# Patient Record
Sex: Male | Born: 1935 | Race: White | Hispanic: No | Smoking: Never smoker
Health system: Southern US, Community
[De-identification: ages and names within clinical notes are randomized; demographics above are authoritative.]

## PROBLEM LIST (undated history)

## (undated) DIAGNOSIS — Z952 Presence of prosthetic heart valve: Secondary | ICD-10-CM

## (undated) DIAGNOSIS — K219 Gastro-esophageal reflux disease without esophagitis: Secondary | ICD-10-CM

## (undated) DIAGNOSIS — Z85828 Personal history of other malignant neoplasm of skin: Secondary | ICD-10-CM

## (undated) DIAGNOSIS — N1831 Chronic kidney disease, stage 3a: Secondary | ICD-10-CM

## (undated) DIAGNOSIS — I350 Nonrheumatic aortic (valve) stenosis: Secondary | ICD-10-CM

## (undated) DIAGNOSIS — I4891 Unspecified atrial fibrillation: Secondary | ICD-10-CM

## (undated) DIAGNOSIS — I739 Peripheral vascular disease, unspecified: Secondary | ICD-10-CM

## (undated) DIAGNOSIS — I5022 Chronic systolic (congestive) heart failure: Secondary | ICD-10-CM

## (undated) DIAGNOSIS — M1990 Unspecified osteoarthritis, unspecified site: Secondary | ICD-10-CM

## (undated) DIAGNOSIS — I714 Abdominal aortic aneurysm, without rupture, unspecified: Secondary | ICD-10-CM

## (undated) DIAGNOSIS — Z86711 Personal history of pulmonary embolism: Secondary | ICD-10-CM

## (undated) DIAGNOSIS — I4510 Unspecified right bundle-branch block: Secondary | ICD-10-CM

## (undated) DIAGNOSIS — Z7901 Long term (current) use of anticoagulants: Secondary | ICD-10-CM

## (undated) DIAGNOSIS — I10 Essential (primary) hypertension: Secondary | ICD-10-CM

## (undated) DIAGNOSIS — K6389 Other specified diseases of intestine: Secondary | ICD-10-CM

## (undated) DIAGNOSIS — R3129 Other microscopic hematuria: Secondary | ICD-10-CM

## (undated) DIAGNOSIS — I4821 Permanent atrial fibrillation: Secondary | ICD-10-CM

## (undated) DIAGNOSIS — H3530 Unspecified macular degeneration: Secondary | ICD-10-CM

## (undated) DIAGNOSIS — C801 Malignant (primary) neoplasm, unspecified: Secondary | ICD-10-CM

## (undated) DIAGNOSIS — I272 Pulmonary hypertension, unspecified: Secondary | ICD-10-CM

## (undated) DIAGNOSIS — I2510 Atherosclerotic heart disease of native coronary artery without angina pectoris: Secondary | ICD-10-CM

## (undated) DIAGNOSIS — I700 Atherosclerosis of aorta: Secondary | ICD-10-CM

## (undated) DIAGNOSIS — E7800 Pure hypercholesterolemia, unspecified: Secondary | ICD-10-CM

## (undated) DIAGNOSIS — I779 Disorder of arteries and arterioles, unspecified: Secondary | ICD-10-CM

## (undated) DIAGNOSIS — I495 Sick sinus syndrome: Secondary | ICD-10-CM

## (undated) DIAGNOSIS — K7460 Unspecified cirrhosis of liver: Secondary | ICD-10-CM

## (undated) HISTORY — DX: Other specified diseases of intestine: K63.89

## (undated) HISTORY — PX: ABDOMINAL AORTIC ANEURYSM REPAIR: SUR1152

## (undated) HISTORY — PX: CHOLECYSTECTOMY: SHX55

## (undated) HISTORY — PX: JOINT REPLACEMENT: SHX530

## (undated) HISTORY — PX: CORONARY ANGIOPLASTY: SHX604

## (undated) HISTORY — PX: HEMORRHOID SURGERY: SHX153

## (undated) HISTORY — PX: OTHER SURGICAL HISTORY: SHX169

## (undated) HISTORY — PX: ORTHOPEDIC SURGERY: SHX850

## (undated) HISTORY — DX: Pure hypercholesterolemia, unspecified: E78.00

## (undated) HISTORY — DX: Atherosclerotic heart disease of native coronary artery without angina pectoris: I25.10

## (undated) HISTORY — DX: Peripheral vascular disease, unspecified: I73.9

## (undated) HISTORY — PX: TONSILLECTOMY: SUR1361

## (undated) HISTORY — PX: EYE SURGERY: SHX253

## (undated) HISTORY — DX: Sick sinus syndrome: I49.5

## (undated) HISTORY — PX: TOTAL KNEE ARTHROPLASTY: SHX125

## (undated) HISTORY — PX: HERNIA REPAIR: SHX51

## (undated) SURGERY — Surgical Case
Anesthesia: *Unknown

---

## 1998-07-02 ENCOUNTER — Other Ambulatory Visit: Admission: RE | Admit: 1998-07-02 | Discharge: 1998-07-02 | Payer: Self-pay | Admitting: Urology

## 2000-08-02 ENCOUNTER — Other Ambulatory Visit: Admission: RE | Admit: 2000-08-02 | Discharge: 2000-08-02 | Payer: Self-pay | Admitting: Urology

## 2001-08-02 ENCOUNTER — Other Ambulatory Visit: Admission: RE | Admit: 2001-08-02 | Discharge: 2001-08-02 | Payer: Self-pay | Admitting: Urology

## 2003-12-30 ENCOUNTER — Ambulatory Visit: Payer: Self-pay | Admitting: Cardiology

## 2004-01-01 ENCOUNTER — Encounter: Admission: RE | Admit: 2004-01-01 | Discharge: 2004-01-01 | Payer: Self-pay | Admitting: Chiropractic Medicine

## 2004-01-12 ENCOUNTER — Ambulatory Visit: Payer: Self-pay | Admitting: Cardiology

## 2004-01-26 ENCOUNTER — Ambulatory Visit: Payer: Self-pay | Admitting: Cardiology

## 2004-01-29 ENCOUNTER — Encounter: Admission: RE | Admit: 2004-01-29 | Discharge: 2004-01-29 | Payer: Self-pay | Admitting: Family Medicine

## 2004-02-10 ENCOUNTER — Ambulatory Visit: Payer: Self-pay | Admitting: Internal Medicine

## 2004-03-09 ENCOUNTER — Ambulatory Visit: Payer: Self-pay | Admitting: Cardiology

## 2004-03-10 ENCOUNTER — Ambulatory Visit: Payer: Self-pay

## 2004-03-10 ENCOUNTER — Encounter: Admission: RE | Admit: 2004-03-10 | Discharge: 2004-03-10 | Payer: Self-pay | Admitting: Family Medicine

## 2004-03-11 ENCOUNTER — Ambulatory Visit: Payer: Self-pay | Admitting: Cardiology

## 2004-03-17 ENCOUNTER — Ambulatory Visit: Payer: Self-pay | Admitting: Oncology

## 2004-03-21 ENCOUNTER — Ambulatory Visit: Payer: Self-pay | Admitting: *Deleted

## 2004-04-06 ENCOUNTER — Ambulatory Visit: Payer: Self-pay | Admitting: Cardiology

## 2004-05-04 ENCOUNTER — Ambulatory Visit: Payer: Self-pay | Admitting: Cardiovascular Disease

## 2004-06-02 ENCOUNTER — Ambulatory Visit: Payer: Self-pay | Admitting: Internal Medicine

## 2004-06-23 ENCOUNTER — Ambulatory Visit: Payer: Self-pay | Admitting: Cardiology

## 2004-07-18 ENCOUNTER — Ambulatory Visit: Payer: Self-pay | Admitting: Cardiology

## 2004-07-19 ENCOUNTER — Ambulatory Visit: Payer: Self-pay | Admitting: Cardiology

## 2004-08-01 ENCOUNTER — Ambulatory Visit: Payer: Self-pay | Admitting: Oncology

## 2004-08-01 ENCOUNTER — Ambulatory Visit: Payer: Self-pay | Admitting: Cardiology

## 2004-08-15 ENCOUNTER — Ambulatory Visit: Payer: Self-pay | Admitting: Cardiology

## 2004-09-12 ENCOUNTER — Ambulatory Visit: Payer: Self-pay | Admitting: Cardiology

## 2004-10-03 ENCOUNTER — Ambulatory Visit: Payer: Self-pay | Admitting: Cardiology

## 2004-10-24 ENCOUNTER — Ambulatory Visit: Payer: Self-pay | Admitting: Internal Medicine

## 2004-11-21 ENCOUNTER — Ambulatory Visit: Payer: Self-pay | Admitting: Internal Medicine

## 2004-12-19 ENCOUNTER — Ambulatory Visit: Payer: Self-pay | Admitting: Internal Medicine

## 2005-01-19 ENCOUNTER — Ambulatory Visit: Payer: Self-pay | Admitting: Cardiology

## 2005-01-27 ENCOUNTER — Ambulatory Visit: Payer: Self-pay | Admitting: Cardiology

## 2005-02-10 ENCOUNTER — Ambulatory Visit: Payer: Self-pay | Admitting: Cardiology

## 2005-02-10 ENCOUNTER — Ambulatory Visit: Payer: Self-pay

## 2005-02-16 ENCOUNTER — Ambulatory Visit: Payer: Self-pay | Admitting: Cardiology

## 2005-02-27 ENCOUNTER — Ambulatory Visit: Payer: Self-pay

## 2005-03-13 ENCOUNTER — Ambulatory Visit: Payer: Self-pay | Admitting: Gastroenterology

## 2005-03-16 ENCOUNTER — Ambulatory Visit: Payer: Self-pay | Admitting: Cardiology

## 2005-03-24 ENCOUNTER — Encounter (INDEPENDENT_AMBULATORY_CARE_PROVIDER_SITE_OTHER): Payer: Self-pay | Admitting: Specialist

## 2005-03-24 ENCOUNTER — Ambulatory Visit: Payer: Self-pay | Admitting: Gastroenterology

## 2005-04-13 ENCOUNTER — Ambulatory Visit: Payer: Self-pay | Admitting: Internal Medicine

## 2005-04-24 ENCOUNTER — Ambulatory Visit: Payer: Self-pay | Admitting: Gastroenterology

## 2005-05-11 ENCOUNTER — Ambulatory Visit: Payer: Self-pay | Admitting: Internal Medicine

## 2005-05-22 ENCOUNTER — Ambulatory Visit: Payer: Self-pay | Admitting: Cardiology

## 2005-05-29 ENCOUNTER — Ambulatory Visit: Payer: Self-pay | Admitting: Cardiology

## 2005-06-08 ENCOUNTER — Ambulatory Visit: Payer: Self-pay | Admitting: *Deleted

## 2005-07-06 ENCOUNTER — Ambulatory Visit: Payer: Self-pay | Admitting: Internal Medicine

## 2005-08-03 ENCOUNTER — Ambulatory Visit: Payer: Self-pay | Admitting: Cardiology

## 2005-08-31 ENCOUNTER — Ambulatory Visit: Payer: Self-pay | Admitting: Internal Medicine

## 2005-09-26 ENCOUNTER — Ambulatory Visit: Payer: Self-pay | Admitting: Cardiology

## 2005-10-24 ENCOUNTER — Ambulatory Visit: Payer: Self-pay | Admitting: Cardiology

## 2005-11-16 ENCOUNTER — Ambulatory Visit: Payer: Self-pay | Admitting: Cardiology

## 2005-12-14 ENCOUNTER — Ambulatory Visit: Payer: Self-pay | Admitting: Cardiology

## 2006-01-11 ENCOUNTER — Ambulatory Visit: Payer: Self-pay | Admitting: Cardiology

## 2006-03-05 ENCOUNTER — Ambulatory Visit: Payer: Self-pay | Admitting: Cardiology

## 2006-04-02 ENCOUNTER — Ambulatory Visit: Payer: Self-pay | Admitting: Cardiology

## 2006-04-16 ENCOUNTER — Emergency Department (HOSPITAL_COMMUNITY): Admission: EM | Admit: 2006-04-16 | Discharge: 2006-04-16 | Payer: Self-pay | Admitting: Emergency Medicine

## 2006-04-17 ENCOUNTER — Encounter: Admission: RE | Admit: 2006-04-17 | Discharge: 2006-04-17 | Payer: Self-pay | Admitting: Family Medicine

## 2006-04-30 ENCOUNTER — Ambulatory Visit: Payer: Self-pay | Admitting: Cardiology

## 2006-05-17 ENCOUNTER — Ambulatory Visit: Payer: Self-pay | Admitting: Cardiology

## 2006-05-28 ENCOUNTER — Ambulatory Visit: Payer: Self-pay | Admitting: Cardiology

## 2006-06-25 ENCOUNTER — Ambulatory Visit: Payer: Self-pay | Admitting: Cardiology

## 2006-07-23 ENCOUNTER — Ambulatory Visit: Payer: Self-pay | Admitting: Cardiovascular Disease

## 2006-08-15 ENCOUNTER — Ambulatory Visit: Payer: Self-pay | Admitting: Cardiology

## 2006-09-05 ENCOUNTER — Ambulatory Visit: Payer: Self-pay | Admitting: Internal Medicine

## 2006-09-27 ENCOUNTER — Ambulatory Visit: Payer: Self-pay | Admitting: Internal Medicine

## 2006-10-18 ENCOUNTER — Ambulatory Visit: Payer: Self-pay | Admitting: Cardiology

## 2006-11-08 ENCOUNTER — Ambulatory Visit: Payer: Self-pay | Admitting: Cardiology

## 2006-11-19 ENCOUNTER — Ambulatory Visit: Payer: Self-pay | Admitting: Cardiology

## 2006-11-26 ENCOUNTER — Ambulatory Visit: Payer: Self-pay | Admitting: Cardiology

## 2006-11-26 LAB — CONVERTED CEMR LAB
ALT: 27 units/L (ref 0–53)
AST: 27 units/L (ref 0–37)
Albumin: 3.5 g/dL (ref 3.5–5.2)
Alkaline Phosphatase: 51 units/L (ref 39–117)
BUN: 17 mg/dL (ref 6–23)
Bilirubin, Direct: 0.2 mg/dL (ref 0.0–0.3)
CO2: 29 meq/L (ref 19–32)
Calcium: 8.6 mg/dL (ref 8.4–10.5)
Chloride: 107 meq/L (ref 96–112)
Cholesterol: 125 mg/dL (ref 0–200)
Creatinine, Ser: 1 mg/dL (ref 0.4–1.5)
GFR calc Af Amer: 93 mL/min
GFR calc non Af Amer: 77 mL/min
Glucose, Bld: 102 mg/dL — ABNORMAL HIGH (ref 70–99)
HDL: 41 mg/dL (ref >39.0)
LDL Cholesterol: 63 mg/dL (ref 0–99)
Potassium: 4.7 meq/L (ref 3.5–5.1)
Sodium: 140 meq/L (ref 135–145)
Total Bilirubin: 1.2 mg/dL (ref 0.3–1.2)
Total CHOL/HDL Ratio: 3
Total Protein: 6.6 g/dL (ref 6.0–8.3)
Triglycerides: 103 mg/dL (ref 0–149)
VLDL: 21 mg/dL (ref 0–40)

## 2006-12-10 ENCOUNTER — Ambulatory Visit: Payer: Self-pay | Admitting: Cardiology

## 2007-01-01 ENCOUNTER — Ambulatory Visit: Payer: Self-pay | Admitting: Cardiology

## 2007-01-22 ENCOUNTER — Ambulatory Visit: Payer: Self-pay | Admitting: Cardiology

## 2007-02-05 ENCOUNTER — Ambulatory Visit: Payer: Self-pay | Admitting: Cardiology

## 2007-02-19 ENCOUNTER — Ambulatory Visit: Payer: Self-pay

## 2007-02-26 ENCOUNTER — Ambulatory Visit: Payer: Self-pay | Admitting: Cardiology

## 2007-03-26 ENCOUNTER — Ambulatory Visit: Payer: Self-pay | Admitting: Internal Medicine

## 2007-04-17 ENCOUNTER — Ambulatory Visit: Payer: Self-pay | Admitting: Cardiology

## 2007-06-05 ENCOUNTER — Ambulatory Visit: Payer: Self-pay | Admitting: Cardiology

## 2007-07-03 ENCOUNTER — Ambulatory Visit: Payer: Self-pay | Admitting: Cardiology

## 2007-07-30 ENCOUNTER — Ambulatory Visit: Payer: Self-pay | Admitting: Cardiology

## 2007-08-27 ENCOUNTER — Ambulatory Visit: Payer: Self-pay | Admitting: Cardiology

## 2007-09-19 ENCOUNTER — Ambulatory Visit: Payer: Self-pay | Admitting: Internal Medicine

## 2007-10-17 ENCOUNTER — Ambulatory Visit: Payer: Self-pay | Admitting: Internal Medicine

## 2007-10-17 ENCOUNTER — Ambulatory Visit: Payer: Self-pay | Admitting: Cardiology

## 2007-11-01 ENCOUNTER — Encounter: Admission: RE | Admit: 2007-11-01 | Discharge: 2007-11-01 | Payer: Self-pay | Admitting: Family Medicine

## 2007-11-14 ENCOUNTER — Ambulatory Visit: Payer: Self-pay | Admitting: Internal Medicine

## 2007-12-05 ENCOUNTER — Ambulatory Visit: Payer: Self-pay | Admitting: Cardiology

## 2007-12-12 ENCOUNTER — Ambulatory Visit: Payer: Self-pay | Admitting: Internal Medicine

## 2007-12-31 ENCOUNTER — Ambulatory Visit: Payer: Self-pay | Admitting: Cardiology

## 2008-01-28 ENCOUNTER — Ambulatory Visit: Payer: Self-pay | Admitting: Cardiology

## 2008-02-25 ENCOUNTER — Ambulatory Visit: Payer: Self-pay | Admitting: Cardiovascular Disease

## 2008-03-24 ENCOUNTER — Ambulatory Visit: Payer: Self-pay | Admitting: Cardiology

## 2008-04-21 ENCOUNTER — Ambulatory Visit: Payer: Self-pay | Admitting: Cardiology

## 2008-04-23 ENCOUNTER — Ambulatory Visit: Payer: Self-pay | Admitting: Cardiology

## 2008-05-19 ENCOUNTER — Ambulatory Visit: Payer: Self-pay | Admitting: Cardiovascular Disease

## 2008-05-26 ENCOUNTER — Ambulatory Visit: Payer: Self-pay | Admitting: Cardiology

## 2008-06-16 ENCOUNTER — Ambulatory Visit: Payer: Self-pay | Admitting: Cardiology

## 2008-07-10 ENCOUNTER — Ambulatory Visit: Payer: Self-pay | Admitting: Cardiology

## 2008-07-10 LAB — CONVERTED CEMR LAB
POC INR: 3.6
Protime: 22.8

## 2008-07-14 ENCOUNTER — Encounter: Payer: Self-pay | Admitting: *Deleted

## 2008-08-07 ENCOUNTER — Encounter (INDEPENDENT_AMBULATORY_CARE_PROVIDER_SITE_OTHER): Payer: Self-pay | Admitting: Pharmacist

## 2008-08-07 ENCOUNTER — Ambulatory Visit: Payer: Self-pay | Admitting: Internal Medicine

## 2008-08-07 LAB — CONVERTED CEMR LAB
POC INR: 3.3
Prothrombin Time: 21.8 s

## 2008-08-11 ENCOUNTER — Telehealth: Payer: Self-pay | Admitting: Cardiology

## 2008-08-19 ENCOUNTER — Encounter: Payer: Self-pay | Admitting: *Deleted

## 2008-08-28 ENCOUNTER — Ambulatory Visit: Payer: Self-pay | Admitting: Cardiology

## 2008-08-28 LAB — CONVERTED CEMR LAB
POC INR: 2.4
Prothrombin Time: 18.8 s

## 2008-09-25 ENCOUNTER — Ambulatory Visit: Payer: Self-pay | Admitting: Cardiology

## 2008-09-25 LAB — CONVERTED CEMR LAB: POC INR: 2.6

## 2008-10-20 ENCOUNTER — Telehealth (INDEPENDENT_AMBULATORY_CARE_PROVIDER_SITE_OTHER): Payer: Self-pay | Admitting: *Deleted

## 2008-10-21 ENCOUNTER — Telehealth: Payer: Self-pay | Admitting: Cardiology

## 2008-10-21 ENCOUNTER — Ambulatory Visit: Payer: Self-pay | Admitting: Cardiology

## 2008-10-21 DIAGNOSIS — I2510 Atherosclerotic heart disease of native coronary artery without angina pectoris: Secondary | ICD-10-CM | POA: Insufficient documentation

## 2008-10-21 DIAGNOSIS — I25119 Atherosclerotic heart disease of native coronary artery with unspecified angina pectoris: Secondary | ICD-10-CM | POA: Insufficient documentation

## 2008-10-21 DIAGNOSIS — I4821 Permanent atrial fibrillation: Secondary | ICD-10-CM | POA: Insufficient documentation

## 2008-10-21 DIAGNOSIS — E7800 Pure hypercholesterolemia, unspecified: Secondary | ICD-10-CM | POA: Insufficient documentation

## 2008-10-22 ENCOUNTER — Ambulatory Visit: Payer: Self-pay

## 2008-10-22 ENCOUNTER — Encounter: Payer: Self-pay | Admitting: Cardiovascular Disease

## 2008-10-27 ENCOUNTER — Inpatient Hospital Stay (HOSPITAL_COMMUNITY): Admission: RE | Admit: 2008-10-27 | Discharge: 2008-10-29 | Payer: Self-pay | Admitting: Orthopedic Surgery

## 2008-10-30 ENCOUNTER — Encounter: Payer: Self-pay | Admitting: Internal Medicine

## 2008-10-30 LAB — CONVERTED CEMR LAB
POC INR: 1.8
Prothrombin Time: 18.4 s

## 2008-11-04 ENCOUNTER — Encounter: Payer: Self-pay | Admitting: Internal Medicine

## 2008-11-04 ENCOUNTER — Encounter: Payer: Self-pay | Admitting: Cardiology

## 2008-11-04 LAB — CONVERTED CEMR LAB
POC INR: 4.1
Prothrombin Time: 41.1 s

## 2008-11-11 ENCOUNTER — Encounter: Payer: Self-pay | Admitting: Cardiovascular Disease

## 2008-11-11 ENCOUNTER — Encounter: Payer: Self-pay | Admitting: Cardiology

## 2008-11-11 LAB — CONVERTED CEMR LAB: POC INR: 1.5

## 2008-11-17 ENCOUNTER — Ambulatory Visit: Payer: Self-pay | Admitting: Cardiology

## 2008-11-17 LAB — CONVERTED CEMR LAB: POC INR: 2.8

## 2008-12-01 ENCOUNTER — Ambulatory Visit: Payer: Self-pay | Admitting: Cardiology

## 2008-12-01 LAB — CONVERTED CEMR LAB: POC INR: 2.5

## 2008-12-29 ENCOUNTER — Ambulatory Visit: Payer: Self-pay | Admitting: Cardiology

## 2008-12-29 LAB — CONVERTED CEMR LAB: POC INR: 2.1

## 2009-01-27 ENCOUNTER — Ambulatory Visit: Payer: Self-pay | Admitting: Cardiology

## 2009-01-27 DIAGNOSIS — I10 Essential (primary) hypertension: Secondary | ICD-10-CM | POA: Insufficient documentation

## 2009-01-27 LAB — CONVERTED CEMR LAB: POC INR: 2.3

## 2009-02-23 ENCOUNTER — Encounter (INDEPENDENT_AMBULATORY_CARE_PROVIDER_SITE_OTHER): Payer: Self-pay | Admitting: Cardiology

## 2009-02-23 ENCOUNTER — Ambulatory Visit: Payer: Self-pay | Admitting: Internal Medicine

## 2009-02-23 LAB — CONVERTED CEMR LAB: POC INR: 2.8

## 2009-03-23 ENCOUNTER — Ambulatory Visit: Payer: Self-pay | Admitting: Cardiology

## 2009-03-23 LAB — CONVERTED CEMR LAB: POC INR: 2.1

## 2009-04-20 ENCOUNTER — Ambulatory Visit: Payer: Self-pay | Admitting: Cardiovascular Disease

## 2009-04-20 LAB — CONVERTED CEMR LAB: POC INR: 1.9

## 2009-05-18 ENCOUNTER — Ambulatory Visit: Payer: Self-pay | Admitting: Internal Medicine

## 2009-05-18 LAB — CONVERTED CEMR LAB: POC INR: 2.5

## 2009-05-24 ENCOUNTER — Encounter: Payer: Self-pay | Admitting: Cardiology

## 2009-06-15 ENCOUNTER — Ambulatory Visit: Payer: Self-pay | Admitting: Cardiology

## 2009-06-15 LAB — CONVERTED CEMR LAB: POC INR: 2.6

## 2009-06-23 ENCOUNTER — Encounter: Payer: Self-pay | Admitting: Cardiology

## 2009-07-14 ENCOUNTER — Ambulatory Visit: Payer: Self-pay | Admitting: Cardiology

## 2009-07-14 DIAGNOSIS — R609 Edema, unspecified: Secondary | ICD-10-CM | POA: Insufficient documentation

## 2009-07-14 LAB — CONVERTED CEMR LAB: POC INR: 2.2

## 2009-07-19 ENCOUNTER — Telehealth: Payer: Self-pay | Admitting: Cardiology

## 2009-07-29 ENCOUNTER — Ambulatory Visit: Payer: Self-pay

## 2009-07-29 ENCOUNTER — Encounter: Payer: Self-pay | Admitting: Cardiology

## 2009-08-11 ENCOUNTER — Ambulatory Visit: Payer: Self-pay | Admitting: Cardiology

## 2009-08-11 LAB — CONVERTED CEMR LAB: POC INR: 1.9

## 2009-08-31 ENCOUNTER — Telehealth: Payer: Self-pay | Admitting: Gastroenterology

## 2009-09-09 ENCOUNTER — Ambulatory Visit: Payer: Self-pay | Admitting: Internal Medicine

## 2009-09-09 LAB — CONVERTED CEMR LAB: POC INR: 2.1

## 2009-10-07 ENCOUNTER — Ambulatory Visit: Payer: Self-pay | Admitting: Internal Medicine

## 2009-10-07 LAB — CONVERTED CEMR LAB: POC INR: 2

## 2009-11-04 ENCOUNTER — Ambulatory Visit: Payer: Self-pay | Admitting: Cardiology

## 2009-11-04 LAB — CONVERTED CEMR LAB: POC INR: 2.2

## 2009-12-01 ENCOUNTER — Ambulatory Visit: Payer: Self-pay | Admitting: Cardiology

## 2009-12-01 ENCOUNTER — Ambulatory Visit: Payer: Self-pay | Admitting: Internal Medicine

## 2009-12-01 LAB — CONVERTED CEMR LAB: POC INR: 2.4

## 2009-12-02 ENCOUNTER — Encounter: Payer: Self-pay | Admitting: Cardiology

## 2009-12-29 ENCOUNTER — Ambulatory Visit: Payer: Self-pay | Admitting: Cardiology

## 2009-12-29 LAB — CONVERTED CEMR LAB
INR: 2.1
POC INR: 2.1

## 2010-01-26 ENCOUNTER — Ambulatory Visit: Payer: Self-pay | Admitting: Cardiology

## 2010-01-26 LAB — CONVERTED CEMR LAB: POC INR: 1.5

## 2010-02-01 ENCOUNTER — Telehealth (INDEPENDENT_AMBULATORY_CARE_PROVIDER_SITE_OTHER): Payer: Self-pay | Admitting: *Deleted

## 2010-02-18 ENCOUNTER — Telehealth: Payer: Self-pay | Admitting: Cardiology

## 2010-02-28 ENCOUNTER — Ambulatory Visit: Admission: RE | Admit: 2010-02-28 | Discharge: 2010-02-28 | Payer: Self-pay | Source: Home / Self Care

## 2010-02-28 LAB — CONVERTED CEMR LAB: POC INR: 2.2

## 2010-03-15 NOTE — Medication Information (Signed)
Summary: [facility]  Anticoagulant Therapy  Managed by: Cloyde Reams, RN, BSN Referring MD: Shawnie Pons MD Supervising MD: Tenny Craw MD, Gunnar Fusi Indication 1: Atrial Fibrillation (ICD-427.31) Lab Used: [facility] Site: [facility] PT 18.4 INR POC 1.8 INR RANGE 2.0-3.0      Any changes in medication regimen? yes       Details: added hydrocodone/APAP and methocarbonol.       Allergies: No Known Drug Allergies  Anticoagulation Management History:      His anticoagulation is being managed by telephone today.  Positive risk factors for bleeding include an age of 75 years or older.  The bleeding index is 'intermediate risk'.  Positive CHADS2 values include Age > 75 years old.  The start date was 04/16/2000.  Prothrombin time is 18.4.  Anticoagulation responsible provider: Tenny Craw MD, Gunnar Fusi.  INR POC: 1.8.  Exp: 11/2009.    Anticoagulation Management Assessment/Plan:      The patient's current anticoagulation dose is Warfarin sodium 5 mg tabs: Use as directed by Anticoagulation Clinic.  The target INR is 0 - 0.  The next INR is due 11/04/2008.  Anticoagulation instructions were given to patient.  Results were reviewed/authorized by Cloyde Reams, RN, BSN.  He was notified by Cloyde Reams RN.         Prior Anticoagulation Instructions: INR 2.6  continue on the same dose: 1 tablet everyday  Recheck in 4 weeks  Current Anticoagulation Instructions: INR 1.8  Called spoke with pt.  Advised pt to take 7.5mg  today then resume 5mg  daily.  Recheck on 11/04/08.  Attempted to call Myrene Buddy at [facility] to give redraw PT/INR orders.  LMOM TCB. Cloyde Reams RN  October 30, 2008 11:43 AM   Spoke with Myrene Buddy, gave verbal order to redraw PT/INR on 11/04/08.  Cloyde Reams RN  October 30, 2008 2:01 PM

## 2010-03-15 NOTE — Medication Information (Signed)
Summary: rov/eac  Anticoagulant Therapy  Managed by: Bethena Midget, RN, BSN Referring MD: Shawnie Pons MD Supervising MD: Riley Kill MD, Maisie Fus Indication 1: Atrial Fibrillation (ICD-427.31) Lab Used: [facility] Site: [address] INR POC 2.1 INR RANGE 2.0-3.0  Dietary changes: no    Health status changes: no    Bleeding/hemorrhagic complications: no    Recent/future hospitalizations: no    Any changes in medication regimen? no    Recent/future dental: no  Any missed doses?: no       Is patient compliant with meds? yes       Allergies: No Known Drug Allergies  Anticoagulation Management History:      The patient is taking warfarin and comes in today for a routine follow up visit.  Positive risk factors for bleeding include an age of 75 years or older.  The bleeding index is 'intermediate risk'.  Positive CHADS2 values include History of HTN and Age > 75 years old.  The start date was 04/16/2000.  Anticoagulation responsible provider: Riley Kill MD, Maisie Fus.  INR POC: 2.1.  Cuvette Lot#: [id number].  Exp: 05/2010.    Anticoagulation Management Assessment/Plan:      The patient's current anticoagulation dose is Warfarin sodium 5 mg tabs: Use as directed by Anticoagulation Clinic.  The target INR is 2.0-3.0.  The next INR is due 04/20/2009.  Anticoagulation instructions were given to patient.  Results were reviewed/authorized by Bethena Midget, RN, BSN.  He was notified by Bethena Midget, RN, BSN.         Prior Anticoagulation Instructions: INR 2.8  Continue 1 tab daily.    Current Anticoagulation Instructions: INR 2.1 Continue 5mg s everyday. Recheck in 4 weeks.

## 2010-03-15 NOTE — Progress Notes (Signed)
Summary: Surg Clearance  Phone Note From Other Clinic   Caller: Nurse Reason for Call: Need Referral Information Summary of Call: Per Clydie Braun [phone] fax [phone]. Pt procedure is the 14th. He needs clearance for the procedure. Form was faxed and scanned into pt chart on the 3rd.  Initial call taken by: Edman Circle,  October 21, 2008 8:23 AM  Follow-up for Phone Call        I spoke with Clydie Braun and she will send the pt over to our office for an appointment to address surgical clearance. Follow-up by: Julieta Gutting, RN, BSN,  October 21, 2008 8:48 AM

## 2010-03-15 NOTE — Medication Information (Signed)
Summary: rov/tm  Anticoagulant Therapy  Managed by: Lyna Poser, PharmD Referring MD: Shawnie Pons MD PCP: Verdell Face MD: Graciela Husbands MD, Viviann Spare Indication 1: Atrial Fibrillation (ICD-427.31) Lab Used: [facility] Site: [address] INR POC 2.2 INR RANGE 2.0-3.0  Dietary changes: no    Health status changes: no    Bleeding/hemorrhagic complications: no    Recent/future hospitalizations: yes       Details: will be talking with orthopedic doctor on october 5th about scheduling a knee replacement. Will call us with date.  Any changes in medication regimen? no    Recent/future dental: no  Any missed doses?: no       Is patient compliant with meds? yes       Allergies: No Known Drug Allergies  Anticoagulation Management History:      Positive risk factors for bleeding include an age of 75 years or older.  The bleeding index is 'intermediate risk'.  Positive CHADS2 values include History of HTN and Age > 11 years old.  The start date was 04/16/2000.  Anticoagulation responsible provider: Graciela Husbands MD, Viviann Spare.  INR POC: 2.2.  Exp: 11/14/2010.    Anticoagulation Management Assessment/Plan:      The patient's current anticoagulation dose is Warfarin sodium 5 mg tabs: Use as directed by Anticoagulation Clinic.  The target INR is 2.0-3.0.  The next INR is due 12/02/2009.  Anticoagulation instructions were given to patient.  Results were reviewed/authorized by Lyna Poser, PharmD.         Prior Anticoagulation Instructions: INR 2.0 Today take 1.5 pills then resume 1 pill everyday. Recheck in 4 weeks. Decrease the amount of leafy green vegetables.   Current Anticoagulation Instructions: INR 2.2  Continue taking 1 tablet everyday. No changes today. Recheck in 4 weeks.

## 2010-03-15 NOTE — Letter (Signed)
Summary: [facility] Surgical Clearance  [facility] Surgical Clearance   Imported By: Roderic Ovens 11/03/2008 15:37:34  _____________________________________________________________________  External Attachment:    Type:   Image     Comment:   External Document

## 2010-03-15 NOTE — Letter (Signed)
Summary: Handout Printed  Printed Handout:  - Coumadin Instructions-w/out Meds 

## 2010-03-15 NOTE — Medication Information (Signed)
Summary: rov/tm  Anticoagulant Therapy  Managed by: Bethena Midget, RN, BSN Referring MD: Shawnie Pons MD Supervising MD: Graciela Husbands MD, Viviann Spare Indication 1: Atrial Fibrillation (ICD-427.31) Lab Used: [facility] Site: [address] INR POC 2.5 INR RANGE 2.0-3.0  Dietary changes: no    Health status changes: no    Bleeding/hemorrhagic complications: no    Recent/future hospitalizations: no    Any changes in medication regimen? no    Recent/future dental: no  Any missed doses?: no       Is patient compliant with meds? yes       Allergies: No Known Drug Allergies  Anticoagulation Management History:      The patient is taking warfarin and comes in today for a routine follow up visit.  Positive risk factors for bleeding include an age of 75 years or older.  The bleeding index is 'intermediate risk'.  Positive CHADS2 values include History of HTN and Age > 38 years old.  The start date was 04/16/2000.  Anticoagulation responsible provider: Graciela Husbands MD, Viviann Spare.  INR POC: 2.5.  Cuvette Lot#: [id number].  Exp: 06/2010.    Anticoagulation Management Assessment/Plan:      The patient's current anticoagulation dose is Warfarin sodium 5 mg tabs: Use as directed by Anticoagulation Clinic.  The target INR is 2.0-3.0.  The next INR is due 06/15/2009.  Anticoagulation instructions were given to patient.  Results were reviewed/authorized by Bethena Midget, RN, BSN.  He was notified by Bethena Midget, RN, BSN.         Prior Anticoagulation Instructions: INR 1.9 Today take 7.5mg s then resume 5mg s everyday. Recheck in 4 weeks.   Current Anticoagulation Instructions: INR 2.5 Continue 5mg s daily. REcheck in 4 weeks.

## 2010-03-15 NOTE — Medication Information (Signed)
Summary: rov/sel  Anticoagulant Therapy  Managed by: Darrick Penna Referring MD: Shawnie Pons MD PCP: Verdell Face MD: Myrtis Ser MD, Tinnie Gens Indication 1: Atrial Fibrillation (ICD-427.31) Lab Used: [facility] Site: [address] INR POC 2.1 INR RANGE 2.0-3.0  Dietary changes: no    Health status changes: no    Bleeding/hemorrhagic complications: no    Recent/future hospitalizations: no    Any changes in medication regimen? no    Recent/future dental: no  Any missed doses?: no       Is patient compliant with meds? yes       Allergies: No Known Drug Allergies  Anticoagulation Management History:      The patient is taking warfarin and comes in today for a routine follow up visit.  Positive risk factors for bleeding include an age of 75 years or older.  The bleeding index is 'intermediate risk'.  Positive CHADS2 values include History of HTN and Age > 41 years old.  The start date was 04/16/2000.  Today's INR is 2.1.  Anticoagulation responsible provider: Myrtis Ser MD, Tinnie Gens.  INR POC: 2.1.  Cuvette Lot#: [id number].  Exp: 01/2011.    Anticoagulation Management Assessment/Plan:      The patient's current anticoagulation dose is Warfarin sodium 5 mg tabs: Use as directed by Anticoagulation Clinic.  The target INR is 2.0-3.0.  The next INR is due 01/26/2010.  Anticoagulation instructions were given to patient.  Results were reviewed/authorized by Darrick Penna.  He was notified by Cathlyn Parsons RN.         Prior Anticoagulation Instructions: INR 2.4  Continue taking same dose of 1 tablet everyday. Recheck in 4 weeks.   Current Anticoagulation Instructions: INR 2.1 Continue same dose of 1tab 7days a week. Recheck in 4wks.

## 2010-03-15 NOTE — Medication Information (Signed)
Summary: rov/tm  Anticoagulant Therapy  Managed by: Eda Keys, PharmD Referring MD: Shawnie Pons MD Supervising MD: Myrtis Ser MD, Tinnie Gens Indication 1: Atrial Fibrillation (ICD-427.31) Lab Used: [facility] Site: [address] INR POC 2.8 INR RANGE 2.0-3.0  Dietary changes: no    Health status changes: no    Bleeding/hemorrhagic complications: no    Recent/future hospitalizations: no    Any changes in medication regimen? yes       Details: On pain medication s/p TKA  Recent/future dental: no  Any missed doses?: no       Is patient compliant with meds? yes       Current Medications (verified): 1)  Warfarin Sodium 5 Mg Tabs (Warfarin Sodium) .... Use As Directed By Anticoagulation Clinic 2)  Verapamil Hcl Cr 240 Mg Xr24h-Cap (Verapamil Hcl) .... Take One Capsule By Mouth Daily 3)  Lipitor 20 Mg Tabs (Atorvastatin Calcium) .... Take One Tablet By Mouth Daily. 4)  Altace 10 Mg Caps (Ramipril) .... One By Mouth Two Times A Day 5)  Zetia 10 Mg Tabs (Ezetimibe) .... Take One Tablet By Mouth Daily. 6)  Fish Oil 1200 Mg Caps (Omega-3 Fatty Acids) .... One By Mouth Two Times A Day 7)  Vitamin C Cr 1500 Mg Cr-Tabs (Ascorbic Acid) .... One By Mouth Daily 8)  Aspirin 81 Mg Tbec (Aspirin) .... Take One Tablet By Mouth Daily 9)  Vitamin D3 2000 Unit Caps (Cholecalciferol) .... 2 Tab By Mouth Daily 10)  Lorazepam 1 Mg Tabs (Lorazepam) .... 1/2-1 Tab As Needed 11)  Ocuvite Preservision  Tabs (Multiple Vitamins-Minerals) .Marland Kitchen.. 1 By Mouth Daily 12)  Hydrocodone-Acetaminophen 5-500 Mg Tabs (Hydrocodone-Acetaminophen) .... As Needed 13)  Methocarbamol 750 Mg Tabs (Methocarbamol) .... As Needed 14)  Miralax  Powd (Polyethylene Glycol 3350) .... As Needed 15)  Hydrochlorothiazide 12.5 Mg Caps (Hydrochlorothiazide) .... Take 1 Cap Daily  Allergies (verified): No Known Drug Allergies  Anticoagulation Management History:      The patient is taking warfarin and comes in today for a  routine follow up visit.  Positive risk factors for bleeding include an age of 3 years or older.  The bleeding index is 'intermediate risk'.  Positive CHADS2 values include Age > 67 years old.  The start date was 04/16/2000.  Anticoagulation responsible provider: Myrtis Ser MD, Tinnie Gens.  INR POC: 2.8.  Cuvette Lot#: [id number].  Exp: 12/2009.    Anticoagulation Management Assessment/Plan:      The patient's current anticoagulation dose is Warfarin sodium 5 mg tabs: Use as directed by Anticoagulation Clinic.  The target INR is 0 - 0.  The next INR is due 12/01/2008.  Anticoagulation instructions were given to patient.  Results were reviewed/authorized by Eda Keys, PharmD.  He was notified by Eda Keys.         Prior Anticoagulation Instructions: INR 1.5 Today and Thursday take 7.5mg s then resume 5mg s daily. Home Health discharging pt. follow up s/c here at office.   Current Anticoagulation Instructions: INR 2.8  Continue current dosing schedule of 1 tablet (5 mg) daily. Return to clinic in 2 weeks.

## 2010-03-15 NOTE — Assessment & Plan Note (Signed)
Summary: Tyler Williams   Visit Type:  6 months follow up Primary Provider:  Jacky Kindle  CC:  No complaints.  History of Present Illness: Patient is scheduled to have surgery.  He has some reflux symptoms, and mylanta and tums help alot.  Nothing like prior cardiac stuff.  He wants to have surgery in order to stay active.  Again, no current symptoms.  Had last GXT in fall of 2009, and last echo about a year ago, at which time his EF was preserved.    Current Medications (verified): 1)  Warfarin Sodium 5 Mg Tabs (Warfarin Sodium) .... Use As Directed By Anticoagulation Clinic 2)  Verapamil Hcl Cr 240 Mg Xr24h-Cap (Verapamil Hcl) .... Take One Capsule By Mouth Daily 3)  Lipitor 20 Mg Tabs (Atorvastatin Calcium) .... Take One Tablet By Mouth Daily. 4)  Altace 10 Mg Caps (Ramipril) .... One By Mouth Two Times A Day 5)  Zetia 10 Mg Tabs (Ezetimibe) .... Take One Tablet By Mouth Daily. 6)  Fish Oil 1200 Mg Caps (Omega-3 Fatty Acids) .... One By Mouth Two Times A Day 7)  Vitamin C Cr 1500 Mg Cr-Tabs (Ascorbic Acid) .... One By Mouth Daily 8)  Aspirin 81 Mg Tbec (Aspirin) .... Sometimes 9)  Vitamin D3 2000 Unit Caps (Cholecalciferol) .... 2 Tab By Mouth Daily 10)  Lorazepam 1 Mg Tabs (Lorazepam) .... 1/2-1 Tab As Needed 11)  Ocuvite Preservision  Tabs (Multiple Vitamins-Minerals) .... Take 1 Tablet By Mouth Two Times A Day 12)  Hydrocodone-Acetaminophen 5-500 Mg Tabs (Hydrocodone-Acetaminophen) .... As Needed 13)  Metamucil 30.9 % Powd (Psyllium) .... As Needed 14)  Hydrochlorothiazide 12.5 Mg Caps (Hydrochlorothiazide) .... Take 1 Cap Daily  Allergies (verified): No Known Drug Allergies  Past History:  Past Medical History: Last updated: 10/21/2008 Coronary artery disease with multiple percutaneous prior coronary       artery interventions History of paroxysmal atrial fibrillation  Peripheral vascular disease Hypercholesterolemia Probable sick sinus syndrome  Social History: Last updated:  10/21/2008 Tobacco Use - No.  Alcohol Use - yes-occasional Drug Use - no  Past Surgical History: Angioplasty Orthopedic surgery.  Vital Signs:  Patient profile:   77 year old male Height:      69.5 inches Weight:      205.75 pounds BMI:     30.06 Pulse rate:   61 / minute Pulse rhythm:   irregular Resp:     18 per minute BP sitting:   130 / 70  (right arm) Cuff size:   large  Vitals Entered By: Vikki Ports (December 01, 2009 11:55 AM)   Exercise Stress Test  Procedure date:  12/12/2007  Findings:      MAXIMUM HEART RATE:  123.   PERCENT OF PREDICTED MAXIMAL HEART RATE:  87%.   COMMENTS:  Mr. [name] exercised on the Bruce protocol today.  He experienced no chest pain.  The test was terminated due to fatigue.  At maximum stress, the patient did go into atrial fibrillation.  We kept him in the laboratory for an hour or so after that, and he did remain in atrial fib.  He has a history of going in and out, and he generally does not feel when he does go out.  However, he tolerated all of this well and there were no side effects.  The study itself did not suggest significant horizontal ST depression.  As a result, it would be reasonable to clear for surgery at the present time.  It has actually been quite  some time since he underwent repeat cardiac catheterization. He has had prior percutaneous intervention as well as peripheral vascular intervention, but currently has no chest pain, no exercise- induced ST depression.  He has had peripheral vascular disease and underwent abdominal aortic aneurysm resection in 1991 by Dr. Hart Rochester and had a PTA of the left superficial femoral artery in November 1997.  He underwent angioplasty in May 1986, and had redo's in 1987 and 1992, but his last catheterization in August 1993 had only 40-50% right, 20% marginal, and 40-50% LAD.  At the present time, he is doing well.  I think he is stable to undergo surgery, but he does understand that  there is always some mild increased risk.  He has had a mildly abnormal echocardiogram.  He has a nodule on his aortic valve, but we did not think that it was a vegetation as there was no clinical evidence to suggest this.  During his surgery, he should not come off of Coumadin and he will remain on verapamil.       Arturo Morton. Riley Kill, MD, [facility] Electronically Signed     TDS/MedQ  DD: 12/12/2007  DT: 12/13/2007  Job #: [id number]  Echocardiogram  Procedure date:  10/22/2008  Findings:      1. Left ventricle: The cavity size was normal. Wall thickness was        increased in a pattern of mild LVH. Systolic function was normal.        The estimated ejection fraction was in the range of 60% to 65%.        Probablymoderate diastolic dysfunction.     2. Aortic valve: The noncoronary cusp is heavily calcified and        fixed. Sclerosis without stenosis. Valve area: 2.54cm 2(VTI).        Valve area: 2.43cm 2 (Vmax).     3. Mitral valve: Mildly calcified annulus. Mild regurgitation.     4. Left atrium: The atrium was moderately dilated.     5. Right ventricle: The cavity size was normal. Systolic function        was normal.     6. Right atrium: The atrium was mildly dilated.     7. Pulmonary arteries: PA systolic pressure 40-44 mmHg.     8. Systemic veins: The IV measures 2.2 cm with < 50% respirophasic        variation, suggesting RA pressure 11-15 mmHg.     Impressions:            - The patient was in atrial fibrillation during this study. Normal       LV size with mild LV hypertrophy. Normal LV systolic function, EF       60-65%. No regional wall motion abnormalities. Aortic sclerosis       with a heavily calcified and fixed noncoronary cusp. Probably       moderate diastolic dysfunction. Mild mitral regurgitation. Mild       pulmonary hypertension.         EKG  Procedure date:  12/01/2009  Findings:      atrial fib with controlled ventricular response.  RBBB.  Impression &  Recommendations:  Problem # 1:  CAD, NATIVE VESSEL (ICD-414.01) He has not been cathed in many years.  He currently experiences no symptoms, and does just about whatever he wants, but with ortho limitatioins more than anything else.  He had GXT in 09, and echo in 10.  Findings are recorded in chart.  Remains on medical regimen, for which he is monitored.  Continue medical regimen.  He should be stable for surgery, although with comorbidities, his risk is increased some for sure, although not unstable.  I doubt that further testing at this point will alter his risk.   His updated medication list for this problem includes:    Warfarin Sodium 5 Mg Tabs (Warfarin sodium) ..... Use as directed by anticoagulation clinic    Verapamil Hcl Cr 240 Mg Xr24h-cap (Verapamil hcl) .Marland Kitchen... Take one capsule by mouth daily    Altace 10 Mg Caps (Ramipril) ..... One by mouth two times a day    Aspirin 81 Mg Tbec (Aspirin) ..... Sometimes  Orders: EKG w/ Interpretation (93000)  Problem # 2:  ATRIAL FIBRILLATION (ICD-427.31) Rate is currently controlled by verapamil, and he is on warfarin.  His CHADS score is 2. His updated medication list for this problem includes:    Warfarin Sodium 5 Mg Tabs (Warfarin sodium) ..... Use as directed by anticoagulation clinic    Aspirin 81 Mg Tbec (Aspirin) ..... Sometimes  Orders: EKG w/ Interpretation (93000)  Problem # 3:  HYPERCHOLESTEROLEMIA  IIA (ICD-272.0) On combo therapy. His updated medication list for this problem includes:    Lipitor 20 Mg Tabs (Atorvastatin calcium) .Marland Kitchen... Take one tablet by mouth daily.    Zetia 10 Mg Tabs (Ezetimibe) .Marland Kitchen... Take one tablet by mouth daily.  Problem # 4:  EDEMA (ICD-782.3) Prior dopplers were neg.  on Calcium blockers.  Also on coumadin at present.   Patient Instructions: 1)  Your physician recommends that you continue on your current medications as directed. Please refer to the Current Medication list given to you today. 2)   Your physician wants you to follow-up in: 6 MONTHS.  You will receive a reminder letter in the mail two months in advance. If you don't receive a letter, please call our office to schedule the follow-up appointment.

## 2010-03-15 NOTE — Medication Information (Signed)
Summary: rov/vb  Anticoagulant Therapy  Managed by: Bethanne Ginger, PharmD Referring MD: Shawnie Pons MD Supervising MD: Antoine Poche MD, Fayrene Fearing Indication 1: Atrial Fibrillation (ICD-427.31) Lab Used: [facility] Site: [address] INR POC 2.3 INR RANGE 2.0-3.0  Dietary changes: no    Health status changes: no    Bleeding/hemorrhagic complications: no    Recent/future hospitalizations: no    Any changes in medication regimen? no    Recent/future dental: no  Any missed doses?: no       Is patient compliant with meds? yes       Current Medications (verified): 1)  Warfarin Sodium 5 Mg Tabs (Warfarin Sodium) .... Use As Directed By Anticoagulation Clinic 2)  Verapamil Hcl Cr 240 Mg Xr24h-Cap (Verapamil Hcl) .... Take One Capsule By Mouth Daily 3)  Lipitor 20 Mg Tabs (Atorvastatin Calcium) .... Take One Tablet By Mouth Daily. 4)  Altace 10 Mg Caps (Ramipril) .... One By Mouth Two Times A Day 5)  Zetia 10 Mg Tabs (Ezetimibe) .... Take One Tablet By Mouth Daily. 6)  Fish Oil 1200 Mg Caps (Omega-3 Fatty Acids) .... One By Mouth Two Times A Day 7)  Vitamin C Cr 1500 Mg Cr-Tabs (Ascorbic Acid) .... One By Mouth Daily 8)  Aspirin 81 Mg Tbec (Aspirin) .... Take One Tablet By Mouth Daily 9)  Vitamin D3 2000 Unit Caps (Cholecalciferol) .... 2 Tab By Mouth Daily 10)  Lorazepam 1 Mg Tabs (Lorazepam) .... 1/2-1 Tab As Needed 11)  Ocuvite Preservision  Tabs (Multiple Vitamins-Minerals) .Marland Kitchen.. 1 By Mouth Daily 12)  Hydrocodone-Acetaminophen 5-500 Mg Tabs (Hydrocodone-Acetaminophen) .... As Needed 13)  Methocarbamol 750 Mg Tabs (Methocarbamol) .... As Needed 14)  Miralax  Powd (Polyethylene Glycol 3350) .... As Needed 15)  Hydrochlorothiazide 12.5 Mg Caps (Hydrochlorothiazide) .... Take 1 Cap Daily  Allergies (verified): No Known Drug Allergies  Anticoagulation Management History:      Positive risk factors for bleeding include an age of 28 years or older.  The bleeding index is  'intermediate risk'.  Positive CHADS2 values include Age > 47 years old.  The start date was 04/16/2000.  Anticoagulation responsible provider: Antoine Poche MD, Fayrene Fearing.  INR POC: 2.3.  Cuvette Lot#: [id number].  Exp: 03/2010.    Anticoagulation Management Assessment/Plan:      The patient's current anticoagulation dose is Warfarin sodium 5 mg tabs: Use as directed by Anticoagulation Clinic.  The target INR is 2.0-3.0.  The next INR is due 02/23/2009.  Anticoagulation instructions were given to patient.  Results were reviewed/authorized by Bethanne Ginger, PharmD.  He was notified by Bethanne Ginger.         Prior Anticoagulation Instructions: INR: 2.1  Continue the current dosing regimen of 1 tablet daily.  Recheck 4 weeks.  Current Anticoagulation Instructions: The patient is to continue with the same dose of coumadin.  This dosage includes:  1 tablet daily

## 2010-03-15 NOTE — Medication Information (Signed)
Summary: ccr  Anticoagulant Therapy  Managed by: Corene Cornea Supervising MD: Dietrich Pates MD  PT 22.8  Dietary changes: no    Health status changes: no    Bleeding/hemorrhagic complications: no    Recent/future hospitalizations: no    Any changes in medication regimen? no    Recent/future dental: no  Any missed doses?: no       Is patient compliant with meds? yes       Allergies (verified): No Known Drug Allergies   Anticoagulation Management History:      The patient is on coumadin and comes in today for a routine follow up visit.  Positive risk factors for bleeding include an age of 76 years or older.  The bleeding index is 'intermediate risk'.  Positive CHADS2 values include Age > 3 years old.    Anticoagulation Management Assessment/Plan:      The patient's current anticoagulation dose is Coumadin 5 mg tabs: Sunday - 1 tab, Monday - 1 tab, Tuesday - 1 tab, Wednesday - 1 tab, Thursday - 1 tab, Friday - 1 tab, Saturday - 1 tab.  Anticoagulation instructions were given to patient.  Results were reviewed/authorized by Charlena Cross rn.  He was notified by Charlena Cross rn.         Current Anticoagulation Instructions: The patient is to hold one dose of coumadin.  The dosage to be resumed includes: today 5/28

## 2010-03-15 NOTE — Progress Notes (Signed)
Summary: Lipitor Refill  Phone Note Call from Patient   Caller: Patient Call For: Patient Summary of Call: Pt called and requested a refill on his Lipitor.  Initial call taken by: Julieta Gutting, RN, BSN,  July 19, 2009 10:10 AM    New/Updated Medications: LIPITOR 20 MG TABS (ATORVASTATIN CALCIUM) Take one tablet by mouth daily. Prescriptions: LIPITOR 20 MG TABS (ATORVASTATIN CALCIUM) Take one tablet by mouth daily.  #60 x 3   Entered by:   Julieta Gutting, RN, BSN   Authorized by:   Ronaldo Miyamoto, MD, [facility]   Signed by:   Julieta Gutting, RN, BSN on 07/19/2009   Method used:   Electronically to        [facility]* (retail)       [address]       [place], [place]  [id number]       Ph: [phone]       Fax: [phone]   RxID:   [id number]

## 2010-03-15 NOTE — Medication Information (Signed)
Summary: rov  Anticoagulant Therapy  Managed by: Shelby Dubin, PharmD Referring MD: Shawnie Pons MD Supervising MD: Dietrich Pates MD Indication 1: Atrial Fibrillation (ICD-427.31) Lab Used: [facility] PT 21.8 INR POC 3.3 INR RANGE 0 - 0  Dietary changes: no    Health status changes: no    Bleeding/hemorrhagic complications: no    Recent/future hospitalizations: no    Any changes in medication regimen? no    Recent/future dental: no  Any missed doses?: no       Is patient compliant with meds? yes      Comments: patient was taking 5 mg everyday except 7.5mg  on Tues  Current Medications (verified): 1)  Coumadin 5 Mg Tabs (Warfarin Sodium) .... Sunday - 1 Tab, Monday - 1 Tab, Tuesday - 1 Tab, Wednesday - 1 Tab, Thursday - 1 Tab, Friday - 1 Tab, Saturday - 1 Tab 2)  Verapamil Hcl Cr 240 Mg Xr24h-Cap (Verapamil Hcl) .... Take One Capsule By Mouth Daily 3)  Lipitor 20 Mg Tabs (Atorvastatin Calcium) .... Take One Tablet By Mouth Daily. 4)  Altace 10 Mg Caps (Ramipril) .... One By Mouth Two Times A Day 5)  Zetia 10 Mg Tabs (Ezetimibe) .... Take One Tablet By Mouth Daily. 6)  Fish Oil 1200 Mg Caps (Omega-3 Fatty Acids) .... One By Mouth Two Times A Day 7)  Vitamin C Cr 1500 Mg Cr-Tabs (Ascorbic Acid) .... One By Mouth Daily 8)  Aspirin 81 Mg Tbec (Aspirin) .... Take One Tablet By Mouth Daily 9)  Vitamin D3 2000 Unit Caps (Cholecalciferol) .... 2 Tab By Mouth Daily 10)  Lorazepam 1 Mg Tabs (Lorazepam) .... 1/2-1 Tab As Needed 11)  Ocuvite Preservision  Tabs (Multiple Vitamins-Minerals) .Marland Kitchen.. 1 By Mouth Daily  Allergies (verified): No Known Drug Allergies  Anticoagulation Management History:      The patient is on coumadin and comes in today for a routine follow up visit.  Positive risk factors for bleeding include an age of 75 years or older.  The bleeding index is 'intermediate risk'.  Positive CHADS2 values include Age > 73 years old.  The start date was 04/16/2000.    Anticoagulation  Management Assessment/Plan:      The patient's current anticoagulation dose is Coumadin 5 mg tabs: Sunday - 1 tab, Monday - 1 tab, Tuesday - 1 tab, Wednesday - 1 tab, Thursday - 1 tab, Friday - 1 tab, Saturday - 1 tab.  He is to have a 08/28/2008.  Anticoagulation instructions were given to patient.  Results were reviewed/authorized by Shelby Dubin, PharmD.  He was notified by Mayo Ao Candidate.         Prior Anticoagulation Instructions: The patient is to hold one dose of coumadin.  The dosage to be resumed includes: today 5/28  Current Anticoagulation Instructions: INR today 3.3  Take 1/2 tablet today (2.5 mg) then take 5 mg everyday.

## 2010-03-15 NOTE — Medication Information (Signed)
Summary: rov/td  Anticoagulant Therapy  Managed by: Shelby Dubin, PharmD, BCPS, CPP Referring MD: Shawnie Pons MD Supervising MD: Gala Romney MD, Reuel Boom Indication 1: Atrial Fibrillation (ICD-427.31) Lab Used: [facility] Site: [facility] INR POC 2.8 INR RANGE 2.0-3.0  Dietary changes: no    Health status changes: no    Bleeding/hemorrhagic complications: no    Recent/future hospitalizations: no    Any changes in medication regimen? no    Recent/future dental: no  Any missed doses?: no       Is patient compliant with meds? yes       Allergies (verified): No Known Drug Allergies  Anticoagulation Management History:      The patient is taking warfarin and comes in today for a routine follow up visit.  Positive risk factors for bleeding include an age of 75 years or older.  The bleeding index is 'intermediate risk'.  Positive CHADS2 values include History of HTN and Age > 47 years old.  The start date was 04/16/2000.  Anticoagulation responsible provider: Naylea Wigington MD, Reuel Boom.  INR POC: 2.8.  Cuvette Lot#: [id number].  Exp: 05/2010.    Anticoagulation Management Assessment/Plan:      The patient's current anticoagulation dose is Warfarin sodium 5 mg tabs: Use as directed by Anticoagulation Clinic.  The target INR is 2.0-3.0.  The next INR is due 03/23/2009.  Anticoagulation instructions were given to patient.  Results were reviewed/authorized by Shelby Dubin, PharmD, BCPS, CPP.  He was notified by Shelby Dubin PharmD, BCPS, CPP.         Prior Anticoagulation Instructions: The patient is to continue with the same dose of coumadin.  This dosage includes:  1 tablet daily  Current Anticoagulation Instructions: INR 2.8  Continue 1 tab daily.

## 2010-03-15 NOTE — Progress Notes (Signed)
Summary: Schedule NP3 to Discuss Colonoscopy  Phone Note Outgoing Call Call back at [facility] Phone [phone]   Call placed by: Harlow Mares CMA Duncan Dull),  August 31, 2009 11:53 AM Call placed to: Patient Summary of Call: patients number ring them goes to a fax machine i will try again later, she is due for a colonoscopy but will need an office visit due to her coumadin and her age.  Initial call taken by: Harlow Mares CMA Duncan Dull),  August 31, 2009 11:53 AM  Follow-up for Phone Call        patietn number rings to a fax number so we will mail the patient a letter to advise the patient he is doe for a colonoscopy but needs an office visit.  Follow-up by: Harlow Mares CMA (AAMA),  September 09, 2009 10:26 AM

## 2010-03-15 NOTE — Medication Information (Signed)
Summary: rov/tm  Anticoagulant Therapy  Managed by: Bethena Midget, RN, BSN Referring MD: Shawnie Pons MD Supervising MD: Myrtis Ser MD, Tinnie Gens Indication 1: Atrial Fibrillation (ICD-427.31) Lab Used: [facility] Site: [address] INR POC 2.2 INR RANGE 2.0-3.0  Dietary changes: no    Health status changes: no    Bleeding/hemorrhagic complications: no    Recent/future hospitalizations: no    Any changes in medication regimen? no    Recent/future dental: no  Any missed doses?: no       Is patient compliant with meds? yes      Comments: Seeing Dr Riley Kill today  Allergies: No Known Drug Allergies  Anticoagulation Management History:      The patient is taking warfarin and comes in today for a routine follow up visit.  Positive risk factors for bleeding include an age of 75 years or older.  The bleeding index is 'intermediate risk'.  Positive CHADS2 values include History of HTN and Age > 4 years old.  The start date was 04/16/2000.  Anticoagulation responsible provider: Myrtis Ser MD, Tinnie Gens.  INR POC: 2.2.  Cuvette Lot#: [id number].  Exp: 09/2010.    Anticoagulation Management Assessment/Plan:      The patient's current anticoagulation dose is Warfarin sodium 5 mg tabs: Use as directed by Anticoagulation Clinic.  The target INR is 2.0-3.0.  The next INR is due 08/11/2009.  Anticoagulation instructions were given to patient.  Results were reviewed/authorized by Bethena Midget, RN, BSN.  He was notified by Bethena Midget, RN, BSN.         Prior Anticoagulation Instructions: INR 2.6 Continue 5mg s daily. Recheck in 4 weeks.   Current Anticoagulation Instructions: INR 2.2 Continue 5mg s daily. Recheck in 4 weeks.

## 2010-03-15 NOTE — Medication Information (Signed)
Summary: rov/ewj  Anticoagulant Therapy  Managed by: Weston Brass, PharmD Referring MD: Shawnie Pons MD Supervising MD: Myrtis Ser MD, Tinnie Gens Indication 1: Atrial Fibrillation (ICD-427.31) Lab Used: [facility] Site: [address] INR POC 2.1 INR RANGE 2.0-3.0  Dietary changes: no    Health status changes: no    Bleeding/hemorrhagic complications: no    Recent/future hospitalizations: no    Any changes in medication regimen? no    Recent/future dental: no  Any missed doses?: no       Is patient compliant with meds? yes      Comments: Pt. states that he eats greens, and tries to keep it consistent.  Allergies (verified): No Known Drug Allergies  Anticoagulation Management History:      The patient is taking warfarin and comes in today for a routine follow up visit.  Positive risk factors for bleeding include an age of 74 years or older.  The bleeding index is 'intermediate risk'.  Positive CHADS2 values include Age > 47 years old.  The start date was 04/16/2000.  Anticoagulation responsible provider: Myrtis Ser MD, Tinnie Gens.  INR POC: 2.1.  Cuvette Lot#: [id number].  Exp: 11/2009.    Anticoagulation Management Assessment/Plan:      The patient's current anticoagulation dose is Warfarin sodium 5 mg tabs: Use as directed by Anticoagulation Clinic.  The target INR is 2.0-3.0.  The next INR is due 01/27/2009.  Anticoagulation instructions were given to patient.  Results were reviewed/authorized by Weston Brass, PharmD.  He was notified by Dani Gobble, Pharmacy Student.         Prior Anticoagulation Instructions: INR 2.5  Continue on same dosage 5 mg daily.  Recheck in 4 weeks.    Current Anticoagulation Instructions: INR: 2.1  Continue the current dosing regimen of 1 tablet daily.  Recheck 4 weeks.

## 2010-03-15 NOTE — Assessment & Plan Note (Signed)
Summary: f98m   Visit Type:  6 MONTHS FOLLOW UP Primary Provider:  aronson  CC:  no complaints.  History of Present Illness: Overall doing reasonably well.  Having trouble with his knee.  Having trouble  with numbness down the foot.  Vascular studies reviewed from 2009.  No chest pain. He is not walking as much as he was in the past.  Recent labs from [facility] reviewed.  Renal function normal, LDL near target.  Has RLE edema which has been there for several years.   Current Medications (verified): 1)  Warfarin Sodium 5 Mg Tabs (Warfarin Sodium) .... Use As Directed By Anticoagulation Clinic 2)  Verapamil Hcl Cr 240 Mg Xr24h-Cap (Verapamil Hcl) .... Take One Capsule By Mouth Daily 3)  Lipitor 20 Mg Tabs (Atorvastatin Calcium) .... Take One Tablet By Mouth Daily. 4)  Altace 10 Mg Caps (Ramipril) .... One By Mouth Two Times A Day 5)  Zetia 10 Mg Tabs (Ezetimibe) .... Take One Tablet By Mouth Daily. 6)  Fish Oil 1200 Mg Caps (Omega-3 Fatty Acids) .... One By Mouth Two Times A Day 7)  Vitamin C Cr 1500 Mg Cr-Tabs (Ascorbic Acid) .... One By Mouth Daily 8)  Aspirin 81 Mg Tbec (Aspirin) .... Take One Tablet By Mouth Daily 9)  Vitamin D3 2000 Unit Caps (Cholecalciferol) .... 2 Tab By Mouth Daily 10)  Lorazepam 1 Mg Tabs (Lorazepam) .... 1/2-1 Tab As Needed 11)  Ocuvite Preservision  Tabs (Multiple Vitamins-Minerals) .Marland Kitchen.. 1 By Mouth Daily 12)  Hydrocodone-Acetaminophen 5-500 Mg Tabs (Hydrocodone-Acetaminophen) .... As Needed 13)  Metamucil 30.9 % Powd (Psyllium) .... As Needed 14)  Hydrochlorothiazide 12.5 Mg Caps (Hydrochlorothiazide) .... Take 1 Cap Daily  Allergies (verified): No Known Drug Allergies  Vital Signs:  Patient profile:   77 year old male Height:      69.5 inches Weight:      204 pounds BMI:     29.80 Pulse rate:   77 / minute Pulse rhythm:   irregular Resp:     18 per minute BP sitting:   148 / 80  (left arm) Cuff size:   large  Vitals Entered By: Vikki Ports (July 14, 2009 9:37 AM)  Physical Exam  General:  Well developed, well nourished, in no acute distress. Head:  normocephalic and atraumatic Eyes:  PERRLA/EOM intact; conjunctiva and lids normal. Lungs:  Clear bilaterally to auscultation and percussion. Heart:  PMI non displaced. Normal S1 and S2.  Minimal SEM.   Abdomen:  Bowel sounds positive; abdomen soft and non-tender without masses, organomegaly, or hernias noted. No hepatosplenomegaly. Pulses:  pulses normal in all 4 extremities Extremities:  No clubbing or cyanosis.  RLE edema plus two.  Left normal.  Neurologic:  Alert and oriented x 3.   EKG  Procedure date:  07/14/2009  Findings:      Atrial fibrillation.  RBBB.    Impression & Recommendations:  Problem # 1:  ATRIAL FIBRILLATION (ICD-427.31)  Persistent.  Controlled.  His updated medication list for this problem includes:    Warfarin Sodium 5 Mg Tabs (Warfarin sodium) ..... Use as directed by anticoagulation clinic    Aspirin 81 Mg Tbec (Aspirin) .Marland Kitchen... Take one tablet by mouth daily  Orders: EKG w/ Interpretation (93000) Venous Duplex Lower Extremity (Venous Duplex Lower)  Problem # 2:  CAD, NATIVE VESSEL (ICD-414.01)  No recurrent chest pain.   His updated medication list for this problem includes:    Warfarin Sodium 5 Mg Tabs (Warfarin sodium) .Marland KitchenMarland KitchenMarland KitchenMarland Kitchen  Use as directed by anticoagulation clinic    Verapamil Hcl Cr 240 Mg Xr24h-cap (Verapamil hcl) .Marland Kitchen... Take one capsule by mouth daily    Altace 10 Mg Caps (Ramipril) ..... One by mouth two times a day    Aspirin 81 Mg Tbec (Aspirin) .Marland Kitchen... Take one tablet by mouth daily  Orders: EKG w/ Interpretation (93000) Venous Duplex Lower Extremity (Venous Duplex Lower)  Problem # 3:  HYPERCHOLESTEROLEMIA  IIA (ICD-272.0)  Near target.  LDL 72.  Continue with follow up Dr. Jacky Kindle. His updated medication list for this problem includes:    Lipitor 20 Mg Tabs (Atorvastatin calcium) .Marland Kitchen... Take one tablet by mouth daily.    Zetia 10  Mg Tabs (Ezetimibe) .Marland Kitchen... Take one tablet by mouth daily.  Orders: EKG w/ Interpretation (93000) Venous Duplex Lower Extremity (Venous Duplex Lower)  Problem # 4:  HYPERTENSION, BENIGN (ICD-401.1)  controlled.  His updated medication list for this problem includes:    Verapamil Hcl Cr 240 Mg Xr24h-cap (Verapamil hcl) .Marland Kitchen... Take one capsule by mouth daily    Altace 10 Mg Caps (Ramipril) ..... One by mouth two times a day    Aspirin 81 Mg Tbec (Aspirin) .Marland Kitchen... Take one tablet by mouth daily    Hydrochlorothiazide 12.5 Mg Caps (Hydrochlorothiazide) .Marland Kitchen... Take 1 cap daily  His updated medication list for this problem includes:    Verapamil Hcl Cr 240 Mg Xr24h-cap (Verapamil hcl) .Marland Kitchen... Take one capsule by mouth daily    Altace 10 Mg Caps (Ramipril) ..... One by mouth two times a day    Aspirin 81 Mg Tbec (Aspirin) .Marland Kitchen... Take one tablet by mouth daily    Hydrochlorothiazide 12.5 Mg Caps (Hydrochlorothiazide) .Marland Kitchen... Take 1 cap daily  Orders: EKG w/ Interpretation (93000) Venous Duplex Lower Extremity (Venous Duplex Lower)  Problem # 5:  EDEMA (ICD-782.3)  RLE edema greater than L.  Doubt DVT, but will rule out.   Orders: EKG w/ Interpretation (93000) Venous Duplex Lower Extremity (Venous Duplex Lower)  Patient Instructions: 1)  Your physician recommends that you continue on your current medications as directed. Please refer to the Current Medication list given to you today. 2)  Your physician wants you to follow-up in:   6 MONTHS. You will receive a reminder letter in the mail two months in advance. If you don't receive a letter, please call our office to schedule the follow-up appointment. 3)  Your physician has requested that you have a lower extremity venous duplex.  This test is an ultrasound of the veins in the legs.  It looks at venous blood flow that carries blood from the heart to the legs.  Allow one hour for a Lower Venous exam.   There are no restrictions or special  instructions. Prescriptions: ZETIA 10 MG TABS (EZETIMIBE) Take one tablet by mouth daily.  #90 x 3   Entered by:   Julieta Gutting, RN, BSN   Authorized by:   Ronaldo Miyamoto, MD, [facility]   Signed by:   Julieta Gutting, RN, BSN on 07/14/2009   Method used:   Electronically to        [facility]* (retail)       [address]       [place], [place]  [id number]       Ph: [phone]       Fax: [phone]   RxID:   [id number]

## 2010-03-15 NOTE — Progress Notes (Signed)
  FAxed over the EKG to [facility] @ [facility] @ fax [phone] [facility]  October 20, 2008 9:29 AM

## 2010-03-15 NOTE — Medication Information (Signed)
Summary: rov/tm  Anticoagulant Therapy  Managed by: Charolotte Eke, PharmD Referring MD: Shawnie Pons MD PCP: Verdell Face MD: Jens Som MD, Arlys John Indication 1: Atrial Fibrillation (ICD-427.31) Lab Used: [facility] Site: [address] INR POC 2.1 INR RANGE 2.0-3.0  Dietary changes: no    Health status changes: no    Bleeding/hemorrhagic complications: no    Recent/future hospitalizations: no    Any changes in medication regimen? no    Recent/future dental: no  Any missed doses?: no       Is patient compliant with meds? yes       Current Medications (verified): 1)  Warfarin Sodium 5 Mg Tabs (Warfarin Sodium) .... Use As Directed By Anticoagulation Clinic 2)  Verapamil Hcl Cr 240 Mg Xr24h-Cap (Verapamil Hcl) .... Take One Capsule By Mouth Daily 3)  Lipitor 20 Mg Tabs (Atorvastatin Calcium) .... Take One Tablet By Mouth Daily. 4)  Altace 10 Mg Caps (Ramipril) .... One By Mouth Two Times A Day 5)  Zetia 10 Mg Tabs (Ezetimibe) .... Take One Tablet By Mouth Daily. 6)  Fish Oil 1200 Mg Caps (Omega-3 Fatty Acids) .... One By Mouth Two Times A Day 7)  Vitamin C Cr 1500 Mg Cr-Tabs (Ascorbic Acid) .... One By Mouth Daily 8)  Aspirin 81 Mg Tbec (Aspirin) .... Take One Tablet By Mouth Daily 9)  Vitamin D3 2000 Unit Caps (Cholecalciferol) .... 2 Tab By Mouth Daily 10)  Lorazepam 1 Mg Tabs (Lorazepam) .... 1/2-1 Tab As Needed 11)  Ocuvite Preservision  Tabs (Multiple Vitamins-Minerals) .Marland Kitchen.. 1 By Mouth Daily 12)  Hydrocodone-Acetaminophen 5-500 Mg Tabs (Hydrocodone-Acetaminophen) .... As Needed 13)  Metamucil 30.9 % Powd (Psyllium) .... As Needed 14)  Hydrochlorothiazide 12.5 Mg Caps (Hydrochlorothiazide) .... Take 1 Cap Daily  Allergies (verified): No Known Drug Allergies  Anticoagulation Management History:      The patient is taking warfarin and comes in today for a routine follow up visit.  Positive risk factors for bleeding include an age of 75 years or older.  The  bleeding index is 'intermediate risk'.  Positive CHADS2 values include History of HTN and Age > 74 years old.  The start date was 04/16/2000.  Anticoagulation responsible provider: Jens Som MD, Arlys John.  INR POC: 2.1.  Cuvette Lot#: [id number].  Exp: 11/14/2010.    Anticoagulation Management Assessment/Plan:      The patient's current anticoagulation dose is Warfarin sodium 5 mg tabs: Use as directed by Anticoagulation Clinic.  The target INR is 2.0-3.0.  The next INR is due 10/07/2009.  Anticoagulation instructions were given to patient.  Results were reviewed/authorized by Charolotte Eke, PharmD.  He was notified by Charolotte Eke, PharmD.         Prior Anticoagulation Instructions: INR 1.9 Today take 7.5mg s then resume 5mg s everyday. Recheck in 4 weeks.   Current Anticoagulation Instructions: Continue 5mg  daily.

## 2010-03-15 NOTE — Medication Information (Signed)
Summary: rov/tm  Anticoagulant Therapy  Managed by: Bethena Midget, RN, BSN Referring MD: Shawnie Pons MD Supervising MD: Shirlee Latch MD, Taji Sather Indication 1: Atrial Fibrillation (ICD-427.31) Lab Used: [facility] Site: [address] INR POC 2.6 INR RANGE 2.0-3.0  Dietary changes: no    Health status changes: no    Bleeding/hemorrhagic complications: no    Recent/future hospitalizations: no    Any changes in medication regimen? no    Recent/future dental: no  Any missed doses?: no       Is patient compliant with meds? yes       Allergies: No Known Drug Allergies  Anticoagulation Management History:      The patient is taking warfarin and comes in today for a routine follow up visit.  Positive risk factors for bleeding include an age of 75 years or older.  The bleeding index is 'intermediate risk'.  Positive CHADS2 values include History of HTN and Age > 4 years old.  The start date was 04/16/2000.  Anticoagulation responsible provider: Shirlee Latch MD, Damiano Stamper.  INR POC: 2.6.  Cuvette Lot#: [id number].  Exp: 07/2010.    Anticoagulation Management Assessment/Plan:      The patient's current anticoagulation dose is Warfarin sodium 5 mg tabs: Use as directed by Anticoagulation Clinic.  The target INR is 2.0-3.0.  The next INR is due 07/14/2009.  Anticoagulation instructions were given to patient.  Results were reviewed/authorized by Bethena Midget, RN, BSN.  He was notified by Bethena Midget, RN, BSN.         Prior Anticoagulation Instructions: INR 2.5 Continue 5mg s daily. REcheck in 4 weeks.   Current Anticoagulation Instructions: INR 2.6 Continue 5mg s daily. Recheck in 4 weeks.

## 2010-03-15 NOTE — Medication Information (Signed)
Summary: rov/mw  Anticoagulant Therapy  Managed by: Weston Brass, PharmD Referring MD: Shawnie Pons MD PCP: Verdell Face MD: Graciela Husbands MD, Viviann Spare Indication 1: Atrial Fibrillation (ICD-427.31) Lab Used: [facility] Site: [address] INR POC 2.4 INR RANGE 2.0-3.0  Dietary changes: no    Health status changes: no    Bleeding/hemorrhagic complications: no    Recent/future hospitalizations: no    Any changes in medication regimen? no    Recent/future dental: no  Any missed doses?: no       Is patient compliant with meds? yes       Allergies: No Known Drug Allergies  Anticoagulation Management History:      The patient is taking warfarin and comes in today for a routine follow up visit.  Positive risk factors for bleeding include an age of 75 years or older.  The bleeding index is 'intermediate risk'.  Positive CHADS2 values include History of HTN and Age > 75 years old.  The start date was 04/16/2000.  Anticoagulation responsible provider: Graciela Husbands MD, Viviann Spare.  INR POC: 2.4.  Cuvette Lot#: [id number].  Exp: 12/2010.    Anticoagulation Management Assessment/Plan:      The patient's current anticoagulation dose is Warfarin sodium 5 mg tabs: Use as directed by Anticoagulation Clinic.  The target INR is 2.0-3.0.  The next INR is due 12/30/2009.  Anticoagulation instructions were given to patient.  Results were reviewed/authorized by Weston Brass, PharmD.  He was notified by Ilean Skill D candidate.         Prior Anticoagulation Instructions: INR 2.2  Continue taking 1 tablet everyday. No changes today. Recheck in 4 weeks.  Current Anticoagulation Instructions: INR 2.4  Continue taking same dose of 1 tablet everyday. Recheck in 4 weeks.

## 2010-03-15 NOTE — Progress Notes (Signed)
Summary: REFILL NEEDED  Medications Added WARFARIN SODIUM 5 MG TABS (WARFARIN SODIUM) Use as directed by Anticoagulation Clinic ISOPTIN SR 240 MG CR-TABS (VERAPAMIL HCL)  ISOPTIN SR 240 MG CR-TABS (VERAPAMIL HCL)        Phone Note Refill Request Call back at Home Phone [phone] Call back at [phone] Message from:  Patient  Refills Requested: Medication #1:  COUMADIN 5 MG TABS Sunday - 1 tab   Supply Requested: 1 month PATIENT NEEDS REFILL [facility]  CALL IN TODAY PLEASE   Method Requested: Fax to Local Pharmacy Initial call taken by: Kathy White,  August 11, 2008 3:48 PM    New/Updated Medications: WARFARIN SODIUM 5 MG TABS (WARFARIN SODIUM) Use as directed by Anticoagulation Clinic ISOPTIN SR 240 MG CR-TABS (VERAPAMIL HCL)  ISOPTIN SR 240 MG CR-TABS (VERAPAMIL HCL)    Prescriptions: WARFARIN SODIUM 5 MG TABS (WARFARIN SODIUM) Use as directed by Anticoagulation Clinic  #60 x 2   Entered by:   Tiffany Muse, RN, BSN   Authorized by:    David , MD, FACC   Signed by:   Tiffany Muse, RN, BSN on 08/11/2008   Method used:   Electronically to        [facility]* (retail)       [address]       [place], [place]  [id number]       Ph: [phone]       Fax: [phone]   RxID:   [id number]

## 2010-03-15 NOTE — Letter (Signed)
Summary: Handout Printed  Printed Handout:  - Coumadin Instructions 

## 2010-03-15 NOTE — Medication Information (Signed)
Summary: rov/tm  Anticoagulant Therapy  Managed by: Bethena Midget, RN, BSN Referring MD: Shawnie Pons MD Supervising MD: Shirlee Latch MD, Yong Wahlquist Indication 1: Atrial Fibrillation (ICD-427.31) Lab Used: [facility] Site: [facility] INR POC 2.6 INR RANGE 2.0-3.0  Dietary changes: no    Health status changes: no    Bleeding/hemorrhagic complications: no    Recent/future hospitalizations: no    Any changes in medication regimen? no    Recent/future dental: no  Any missed doses?: no       Is patient compliant with meds? yes       Allergies (verified): No Known Drug Allergies  Anticoagulation Management History:      The patient is taking warfarin and comes in today for a routine follow up visit.  Positive risk factors for bleeding include an age of 25 years or older.  The bleeding index is 'intermediate risk'.  Positive CHADS2 values include Age > 69 years old.  The start date was 04/16/2000.  Anticoagulation responsible provider: Shirlee Latch MD, Chelcie Estorga.  INR POC: 2.6.  Cuvette Lot#: [id number].  Exp: 11/2009.    Anticoagulation Management Assessment/Plan:      The patient's current anticoagulation dose is Warfarin sodium 5 mg tabs: Use as directed by Anticoagulation Clinic.  The target INR is 0 - 0.  The next INR is due 10/23/2008.  Anticoagulation instructions were given to patient.  Results were reviewed/authorized by Bethena Midget, RN, BSN.  He was notified by Bethena Midget, RN, BSN.         Prior Anticoagulation Instructions: INR today 3.3  Take 1/2 tablet today (2.5 mg) then take 5 mg everyday.   Current Anticoagulation Instructions: INR 2.6  continue on the same dose: 1 tablet everyday  Recheck in 4 weeks

## 2010-03-15 NOTE — Assessment & Plan Note (Signed)
Summary: Surgical Clearance   Visit Type:  Follow-up  CC:  Surgical clearance- Knee replacement.  History of Present Illness: Here for preop evaluation.  He and I had a thorough and long discussion.  Plays golf without difficulty.  No angina or increase in any type of symptoms.  Does not know when he is in and out of atrial fibrillation.  Had ETT last fall as preop and went six minutes without chest pain or ST depression.  He understands that there are some increase risks of surgery.  He denies any major symptoms as noted, and understands there are increased risks.  Meds reviewed in detail.  Last cath was done in 1993.  At that time, he had prior PCI of total RCA, and had non obstructive disease.  Has been stable since, with known PVD and atrial fib, and no symptoms.  Denies symptoms at good work load.  Current Medications (verified): 1)  Warfarin Sodium 5 Mg Tabs (Warfarin Sodium) .... Use As Directed By Anticoagulation Clinic 2)  Verapamil Hcl Cr 240 Mg Xr24h-Cap (Verapamil Hcl) .... Take One Capsule By Mouth Daily 3)  Lipitor 20 Mg Tabs (Atorvastatin Calcium) .... Take One Tablet By Mouth Daily. 4)  Altace 10 Mg Caps (Ramipril) .... One By Mouth Two Times A Day 5)  Zetia 10 Mg Tabs (Ezetimibe) .... Take One Tablet By Mouth Daily. 6)  Fish Oil 1200 Mg Caps (Omega-3 Fatty Acids) .... One By Mouth Two Times A Day 7)  Vitamin C Cr 1500 Mg Cr-Tabs (Ascorbic Acid) .... One By Mouth Daily 8)  Aspirin 81 Mg Tbec (Aspirin) .... Take One Tablet By Mouth Daily 9)  Vitamin D3 2000 Unit Caps (Cholecalciferol) .... 2 Tab By Mouth Daily 10)  Lorazepam 1 Mg Tabs (Lorazepam) .... 1/2-1 Tab As Needed 11)  Ocuvite Preservision  Tabs (Multiple Vitamins-Minerals) .Marland Kitchen.. 1 By Mouth Daily  Allergies (verified): No Known Drug Allergies  Vital Signs:  Patient profile:   77 year old male Height:      69 inches Weight:      205 pounds BMI:     30.38 Pulse rate:   48 / minute Pulse rhythm:   regular Resp:      18 per minute BP sitting:   140 / 62  (left arm) Cuff size:   large  Vitals Entered By: Vikki Ports (October 21, 2008 10:29 AM)   Exercise Stress Test  Procedure date:  12/12/2007  Findings:       DURATION OF EXERCISE:  Six minutes.      MAXIMUM HEART RATE:  123.      PERCENT OF PREDICTED MAXIMAL HEART RATE:  87%.      COMMENTS:  Mr. Weinkauf exercised on the Bruce protocol today.  He   experienced no chest pain.  The test was terminated due to fatigue.  At   maximum stress, the patient did go into atrial fibrillation.  We kept   him in the laboratory for an hour or so after that, and he did remain in   atrial fib.  He has a history of going in and out, and he generally does   not feel when he does go out.  However, he tolerated all of this well   and there were no side effects.  The study itself did not suggest   significant horizontal ST depression.  As a result, it would be   reasonable to clear for surgery at the present time.  It has actually  been quite some time since he underwent repeat cardiac catheterization.   He has had prior percutaneous intervention as well as peripheral   vascular intervention, but currently has no chest pain, no exercise-   induced ST depression.  He has had peripheral vascular disease and   underwent abdominal aortic aneurysm resection in 1991 by Dr. Hart Rochester and   had a PTA of the left superficial femoral artery in November 1997.  He   underwent angioplasty in May 1986, and had redo's in 1987 and 1992, but   his last catheterization in August 1993 had only 40-50% right, 20%   marginal, and 40-50% LAD.  At the present time, he is doing well.  I   think he is stable to undergo surgery, but he does understand that there   is always some mild increased risk.  He has had a mildly abnormal   echocardiogram.  He has a nodule on his aortic valve, but we did not   think that it was a vegetation as there was no clinical evidence to   suggest this.   During his surgery, he should not come off of Coumadin   and he will remain on verapamil.         EKG  Procedure date:  10/21/2008  Findings:      AF with slow VR.  Rate 48.  Does not know he is in afib.  Tolerating well.  NSR with RBBB last tracing.  Also, non specific ST and T abnormality.  Impression & Recommendations:  Problem # 1:  CAD, NATIVE VESSEL (ICD-414.01) Last cath was 1993, and had PCI prior to that time.  Has been symptom free, and had preop treadmill in Sept 2009 as a preop evaluation with trouble.  Discussed various options, but with good exercise tolerance, no symptoms, not high risk GXT, his as stable for surgery as he can be.  He understands coronary events are at best unpredictable in otherwise stable patients.  We will get echo to assess LV preop.  Had mildly abnormal in past. The following medications were removed from the medication list:    Isoptin Sr 240 Mg Cr-tabs (Verapamil hcl) His updated medication list for this problem includes:    Warfarin Sodium 5 Mg Tabs (Warfarin sodium) ..... Use as directed by anticoagulation clinic    Verapamil Hcl Cr 240 Mg Xr24h-cap (Verapamil hcl) .Marland Kitchen... Take one capsule by mouth daily    Altace 10 Mg Caps (Ramipril) ..... One by mouth two times a day    Aspirin 81 Mg Tbec (Aspirin) .Marland Kitchen... Take one tablet by mouth daily  Problem # 2:  ATRIAL FIBRILLATION (ICD-427.31) He will stop his warfarin about 6 days in advance, and we suggest that he resume therafter.  Has fairly slow response to atrial fibrillation, and this will need to be watched during surgery.  But he definitely wants the surgery to be done, and he and I discussed this at great length. His updated medication list for this problem includes:    Warfarin Sodium 5 Mg Tabs (Warfarin sodium) ..... Use as directed by anticoagulation clinic    Aspirin 81 Mg Tbec (Aspirin) .Marland Kitchen... Take one tablet by mouth daily  Orders: Echocardiogram (Echo) EKG w/ Interpretation  (93000)  Patient Instructions: 1)  Your physician recommends that you keep your scheduled follow-up appointment. 11/17/08 at 4:15 2)  Pt is cleared from a cardiac standpoint for upcoming surgery. 3)  Your physician has requested that you have an echocardiogram.  Echocardiography is  a painless test that uses sound waves to create images of your heart. It provides your doctor with information about the size and shape of your heart and how well your heart's chambers and valves are working.  This procedure takes approximately one hour. There are no restrictions for this procedure. S

## 2010-03-15 NOTE — Medication Information (Signed)
Summary: rov/tm  Anticoagulant Therapy  Managed by: Bethena Midget, RN, BSN Referring MD: Shawnie Pons MD PCP: Verdell Face MD: Riley Kill MD, Maisie Fus Indication 1: Atrial Fibrillation (ICD-427.31) Lab Used: [facility] Site: [address] INR POC 1.9 INR RANGE 2.0-3.0  Dietary changes: yes       Details: alittle extra green leafy veggies  Health status changes: no    Bleeding/hemorrhagic complications: no    Recent/future hospitalizations: no    Any changes in medication regimen? no    Recent/future dental: no  Any missed doses?: no       Is patient compliant with meds? yes       Allergies: No Known Drug Allergies  Anticoagulation Management History:      The patient is taking warfarin and comes in today for a routine follow up visit.  Positive risk factors for bleeding include an age of 74 years or older.  The bleeding index is 'intermediate risk'.  Positive CHADS2 values include History of HTN and Age > 28 years old.  The start date was 04/16/2000.  Anticoagulation responsible provider: Riley Kill MD, Maisie Fus.  INR POC: 1.9.  Cuvette Lot#: [id number].  Exp: 10/2010.    Anticoagulation Management Assessment/Plan:      The patient's current anticoagulation dose is Warfarin sodium 5 mg tabs: Use as directed by Anticoagulation Clinic.  The target INR is 2.0-3.0.  The next INR is due 09/09/2009.  Anticoagulation instructions were given to patient.  Results were reviewed/authorized by Bethena Midget, RN, BSN.  He was notified by Bethena Midget, RN, BSN.         Prior Anticoagulation Instructions: INR 2.2 Continue 5mg s daily. Recheck in 4 weeks.   Current Anticoagulation Instructions: INR 1.9 Today take 7.5mg s then resume 5mg s everyday. Recheck in 4 weeks.

## 2010-03-15 NOTE — Letter (Signed)
Summary: [facility] Concealed Handgun Permit  [facility] Concealed Handgun Permit   Imported By: Roderic Ovens 07/15/2009 11:12:21  _____________________________________________________________________  External Attachment:    Type:   Image     Comment:   External Document

## 2010-03-15 NOTE — Medication Information (Signed)
Summary: rov-tp  Anticoagulant Therapy  Managed by: Bethena Midget, RN, BSN Referring MD: Shawnie Pons MD PCP: Verdell Face MD: Graciela Husbands MD, Viviann Spare Indication 1: Atrial Fibrillation (ICD-427.31) Lab Used: [facility] Site: [address] INR POC 2.0 INR RANGE 2.0-3.0  Dietary changes: no    Health status changes: no    Bleeding/hemorrhagic complications: no    Recent/future hospitalizations: no    Any changes in medication regimen? no    Recent/future dental: no  Any missed doses?: no       Is patient compliant with meds? yes       Allergies: No Known Drug Allergies  Anticoagulation Management History:      The patient is taking warfarin and comes in today for a routine follow up visit.  Positive risk factors for bleeding include an age of 75 years or older.  The bleeding index is 'intermediate risk'.  Positive CHADS2 values include History of HTN and Age > 75 years old.  The start date was 04/16/2000.  Anticoagulation responsible provider: Graciela Husbands MD, Viviann Spare.  INR POC: 2.0.  Cuvette Lot#: [id number].  Exp: 11/14/2010.    Anticoagulation Management Assessment/Plan:      The patient's current anticoagulation dose is Warfarin sodium 5 mg tabs: Use as directed by Anticoagulation Clinic.  The target INR is 2.0-3.0.  The next INR is due 11/04/2009.  Anticoagulation instructions were given to patient.  Results were reviewed/authorized by Bethena Midget, RN, BSN.  He was notified by Bethena Midget, RN, BSN.         Prior Anticoagulation Instructions: Continue 5mg  daily.  Current Anticoagulation Instructions: INR 2.0 Today take 1.5 pills then resume 1 pill everyday. Recheck in 4 weeks. Decrease the amount of leafy green vegetables.

## 2010-03-15 NOTE — Medication Information (Signed)
Summary: Coumadin Clinic  Anticoagulant Therapy  Managed by: Cloyde Reams, RN, BSN Referring MD: Shawnie Pons MD Supervising MD: Johney Frame MD, Fayrene Fearing Indication 1: Atrial Fibrillation (ICD-427.31) Lab Used: [facility] Site: [facility] PT 41.1 INR POC 4.1 INR RANGE 2.0-3.0  Dietary changes: no    Health status changes: no           Allergies: No Known Drug Allergies  Anticoagulation Management History:      His anticoagulation is being managed by telephone today.  Positive risk factors for bleeding include an age of 75 years or older.  The bleeding index is 'intermediate risk'.  Positive CHADS2 values include Age > 49 years old.  The start date was 04/16/2000.  Prothrombin time is 41.1.  Anticoagulation responsible provider: Hazle Ogburn MD, Fayrene Fearing.  INR POC: 4.1.  Exp: 11/2009.    Anticoagulation Management Assessment/Plan:      The patient's current anticoagulation dose is Warfarin sodium 5 mg tabs: Use as directed by Anticoagulation Clinic.  The target INR is 0 - 0.  The next INR is due 11/11/2008.  Anticoagulation instructions were given to patient.  Results were reviewed/authorized by Cloyde Reams, RN, BSN.  He was notified by Bethena Midget, RN, BSN.         Prior Anticoagulation Instructions: INR 1.8  Called spoke with pt.  Advised pt to take 7.5mg  today then resume 5mg  daily.  Recheck on 11/04/08.  Attempted to call Myrene Buddy at [facility] to give redraw PT/INR orders.  LMOM TCB. Cloyde Reams RN  October 30, 2008 11:43 AM   Spoke with Myrene Buddy, gave verbal order to redraw PT/INR on 11/04/08.  Cloyde Reams RN  October 30, 2008 2:01 PM   Current Anticoagulation Instructions: INR 4.1  Skip today's dose of coumadin, then resume same dosage 5mg  daily.  Resume eating green leafy vegetable intake.  Recheck in 1 week.  LMOM for Myrene Buddy with [facility] to call back to confirm she has received our message about dose and f/u lab on 11/11/08.

## 2010-03-15 NOTE — Medication Information (Signed)
Summary: rov/eac  Anticoagulant Therapy  Managed by: Cloyde Reams, RN, BSN Referring MD: Shawnie Pons MD Supervising MD: Riley Kill MD, Maisie Fus Indication 1: Atrial Fibrillation (ICD-427.31) Lab Used: [facility] Site: [address] INR POC 2.5 INR RANGE 2.0-3.0  Dietary changes: no    Health status changes: no    Bleeding/hemorrhagic complications: no    Recent/future hospitalizations: no    Any changes in medication regimen? no    Recent/future dental: no  Any missed doses?: no       Is patient compliant with meds? yes       Current Medications (verified): 1)  Warfarin Sodium 5 Mg Tabs (Warfarin Sodium) .... Use As Directed By Anticoagulation Clinic 2)  Verapamil Hcl Cr 240 Mg Xr24h-Cap (Verapamil Hcl) .... Take One Capsule By Mouth Daily 3)  Lipitor 20 Mg Tabs (Atorvastatin Calcium) .... Take One Tablet By Mouth Daily. 4)  Altace 10 Mg Caps (Ramipril) .... One By Mouth Two Times A Day 5)  Zetia 10 Mg Tabs (Ezetimibe) .... Take One Tablet By Mouth Daily. 6)  Fish Oil 1200 Mg Caps (Omega-3 Fatty Acids) .... One By Mouth Two Times A Day 7)  Vitamin C Cr 1500 Mg Cr-Tabs (Ascorbic Acid) .... One By Mouth Daily 8)  Aspirin 81 Mg Tbec (Aspirin) .... Take One Tablet By Mouth Daily 9)  Vitamin D3 2000 Unit Caps (Cholecalciferol) .... 2 Tab By Mouth Daily 10)  Lorazepam 1 Mg Tabs (Lorazepam) .... 1/2-1 Tab As Needed 11)  Ocuvite Preservision  Tabs (Multiple Vitamins-Minerals) .Marland Kitchen.. 1 By Mouth Daily 12)  Hydrocodone-Acetaminophen 5-500 Mg Tabs (Hydrocodone-Acetaminophen) .... As Needed 13)  Methocarbamol 750 Mg Tabs (Methocarbamol) .... As Needed 14)  Miralax  Powd (Polyethylene Glycol 3350) .... As Needed 15)  Hydrochlorothiazide 12.5 Mg Caps (Hydrochlorothiazide) .... Take 1 Cap Daily  Allergies (verified): No Known Drug Allergies  Anticoagulation Management History:      The patient is taking warfarin and comes in today for a routine follow up visit.  Positive risk  factors for bleeding include an age of 75 years or older.  The bleeding index is 'intermediate risk'.  Positive CHADS2 values include Age > 75 years old.  The start date was 04/16/2000.  Anticoagulation responsible provider: Riley Kill MD, Maisie Fus.  INR POC: 2.5.  Cuvette Lot#: [id number].  Exp: 11/2009.    Anticoagulation Management Assessment/Plan:      The patient's current anticoagulation dose is Warfarin sodium 5 mg tabs: Use as directed by Anticoagulation Clinic.  The target INR is 2.0-3.0.  The next INR is due 12/29/2008.  Anticoagulation instructions were given to patient.  Results were reviewed/authorized by Cloyde Reams, RN, BSN.  He was notified by Cloyde Reams RN.         Prior Anticoagulation Instructions: INR 2.8  Continue current dosing schedule of 1 tablet (5 mg) daily. Return to clinic in 2 weeks.  Current Anticoagulation Instructions: INR 2.5  Continue on same dosage 5 mg daily.  Recheck in 4 weeks.

## 2010-03-15 NOTE — Medication Information (Signed)
Summary: Coumadin Clinic  Anticoagulant Therapy  Managed by: Bethena Midget, RN, BSN Referring MD: Shawnie Pons MD Supervising MD: Clifton James MD, Cristal Deer Indication 1: Atrial Fibrillation (ICD-427.31) Lab Used: [facility] Site: [address] INR POC 1.5 INR RANGE 2.0-3.0  Dietary changes: no    Health status changes: no    Bleeding/hemorrhagic complications: no    Recent/future hospitalizations: no    Any changes in medication regimen? no    Recent/future dental: no  Any missed doses?: no       Is patient compliant with meds? yes       Allergies: No Known Drug Allergies  Anticoagulation Management History:      His anticoagulation is being managed by telephone today.  Positive risk factors for bleeding include an age of 55 years or older.  The bleeding index is 'intermediate risk'.  Positive CHADS2 values include Age > 79 years old.  The start date was 04/16/2000.  Anticoagulation responsible provider: Clifton James MD, Cristal Deer.  INR POC: 1.5.    Anticoagulation Management Assessment/Plan:      The patient's current anticoagulation dose is Warfarin sodium 5 mg tabs: Use as directed by Anticoagulation Clinic.  The target INR is 0 - 0.  The next INR is due 11/17/2008.  Anticoagulation instructions were given to patient.  Results were reviewed/authorized by Bethena Midget, RN, BSN.  He was notified by Bethena Midget, RN, BSN.         Prior Anticoagulation Instructions: INR 4.1  Skip today's dose of coumadin, then resume same dosage 5mg  daily.  Resume eating green leafy vegetable intake.  Recheck in 1 week.  LMOM for Myrene Buddy with [facility] to call back to confirm she has received our message about dose and f/u lab on 11/11/08.   Current Anticoagulation Instructions: INR 1.5 Today and Thursday take 7.5mg s then resume 5mg s daily. Home Health discharging pt. follow up s/c here at office.

## 2010-03-15 NOTE — Medication Information (Signed)
Summary: rov/tm  Anticoagulant Therapy  Managed by: Bethena Midget, RN, BSN Referring MD: Shawnie Pons MD Supervising MD: Eden Emms MD, Theron Arista Indication 1: Atrial Fibrillation (ICD-427.31) Lab Used: [facility] Site: [address] INR POC 1.9 INR RANGE 2.0-3.0  Dietary changes: yes       Details: has been having more leafy vegetables  Health status changes: no    Bleeding/hemorrhagic complications: no    Recent/future hospitalizations: no    Any changes in medication regimen? no    Recent/future dental: no  Any missed doses?: no       Is patient compliant with meds? yes       Allergies: No Known Drug Allergies  Anticoagulation Management History:      The patient is taking warfarin and comes in today for a routine follow up visit.  Positive risk factors for bleeding include an age of 75 years or older.  The bleeding index is 'intermediate risk'.  Positive CHADS2 values include History of HTN and Age > 35 years old.  The start date was 04/16/2000.  Anticoagulation responsible provider: Eden Emms MD, Theron Arista.  INR POC: 1.9.  Cuvette Lot#: [id number].  Exp: 06/2010.    Anticoagulation Management Assessment/Plan:      The patient's current anticoagulation dose is Warfarin sodium 5 mg tabs: Use as directed by Anticoagulation Clinic.  The target INR is 2.0-3.0.  The next INR is due 05/18/2009.  Anticoagulation instructions were given to patient.  Results were reviewed/authorized by Bethena Midget, RN, BSN.  He was notified by Bethena Midget, RN, BSN.         Prior Anticoagulation Instructions: INR 2.1 Continue 5mg s everyday. Recheck in 4 weeks.   Current Anticoagulation Instructions: INR 1.9 Today take 7.5mg s then resume 5mg s everyday. Recheck in 4 weeks.

## 2010-03-15 NOTE — Medication Information (Signed)
Summary: rov.m sp  Anticoagulant Therapy  Managed by: Bethena Midget, RN, BSN Referring MD: Shawnie Pons MD Supervising MD: Jens Som MD, Arlys John Indication 1: Atrial Fibrillation (ICD-427.31) Lab Used: [facility] Site: [facility] PT 18.8 INR POC 2.4 INR RANGE 2.0-3.0  Dietary changes: no    Health status changes: no    Bleeding/hemorrhagic complications: no    Recent/future hospitalizations: no    Any changes in medication regimen? no    Recent/future dental: no  Any missed doses?: no       Is patient compliant with meds? yes       Allergies (verified): No Known Drug Allergies  Anticoagulation Management History:      The patient is taking warfarin and comes in today for a routine follow up visit.  Positive risk factors for bleeding include an age of 75 years or older.  The bleeding index is 'intermediate risk'.  Positive CHADS2 values include Age > 45 years old.  The start date was 04/16/2000.  Prothrombin time is 18.8.  Anticoagulation responsible provider: Jens Som MD, Arlys John.  INR POC: 2.4.  Cuvette Lot#: [id number].  Exp: 08/2009.    Anticoagulation Management Assessment/Plan:      The patient's current anticoagulation dose is Warfarin sodium 5 mg tabs: Use as directed by Anticoagulation Clinic.  The target INR is 0 - 0.  The next INR is due 09/25/2008.  Anticoagulation instructions were given to patient.  Results were reviewed/authorized by Bethena Midget, RN, BSN.  He was notified by Bethena Midget, RN.         Prior Anticoagulation Instructions: INR today 3.3  Take 1/2 tablet today (2.5 mg) then take 5 mg everyday.

## 2010-03-15 NOTE — Assessment & Plan Note (Signed)
Summary: ROV   CC:  ROV; No complaints; Needs refill on HCTZ.  History of Present Illness: Has not been playing much golf.  No chest pain.. In atrial fibrillation.  BP at [facility] better. Tolerated surgery without difficulty.  Has had a URI.  Current Medications (verified): 1)  Warfarin Sodium 5 Mg Tabs (Warfarin Sodium) .... Use As Directed By Anticoagulation Clinic 2)  Verapamil Hcl Cr 240 Mg Xr24h-Cap (Verapamil Hcl) .... Take One Capsule By Mouth Daily 3)  Lipitor 20 Mg Tabs (Atorvastatin Calcium) .... Take One Tablet By Mouth Daily. 4)  Altace 10 Mg Caps (Ramipril) .... One By Mouth Two Times A Day 5)  Zetia 10 Mg Tabs (Ezetimibe) .... Take One Tablet By Mouth Daily. 6)  Fish Oil 1200 Mg Caps (Omega-3 Fatty Acids) .... One By Mouth Two Times A Day 7)  Vitamin C Cr 1500 Mg Cr-Tabs (Ascorbic Acid) .... One By Mouth Daily 8)  Aspirin 81 Mg Tbec (Aspirin) .... Take One Tablet By Mouth Daily 9)  Vitamin D3 2000 Unit Caps (Cholecalciferol) .... 2 Tab By Mouth Daily 10)  Lorazepam 1 Mg Tabs (Lorazepam) .... 1/2-1 Tab As Needed 11)  Ocuvite Preservision  Tabs (Multiple Vitamins-Minerals) .Marland Kitchen.. 1 By Mouth Daily 12)  Hydrocodone-Acetaminophen 5-500 Mg Tabs (Hydrocodone-Acetaminophen) .... As Needed 13)  Methocarbamol 750 Mg Tabs (Methocarbamol) .... As Needed 14)  Miralax  Powd (Polyethylene Glycol 3350) .... As Needed 15)  Hydrochlorothiazide 12.5 Mg Caps (Hydrochlorothiazide) .... Take 1 Cap Daily  Allergies (verified): No Known Drug Allergies  Vital Signs:  Patient profile:   77 year old male Height:      69.5 inches Weight:      203 pounds BMI:     29.65 Pulse rate:   75 / minute Pulse rhythm:   irregular BP sitting:   170 / 80  (left arm) Cuff size:   regular  Vitals Entered By: Stanton Kidney, EMT-P (January 27, 2009 11:12 AM)  Physical Exam  General:  Well developed, well nourished, in no acute distress. Lungs:  Clear bilaterally to auscultation and percussion. Heart:  PMI  non displaced.  Normal S1 and S2.  Minimal SEM.  No DM.   Abdomen:  Bowel sounds positive; abdomen soft and non-tender without masses, organomegaly, or hernias noted. No hepatosplenomegaly.  Had Abd surgery.   Extremities:  NSR. Trace edema.   EKG  Procedure date:  01/27/2009  Findings:      Atrial fib, with controlled vent response, RBBB.  Impression & Recommendations:  Problem # 1:  CAD, NATIVE VESSEL (ICD-414.01)  No recurrent symptoms.  Doing well.  Tolerated surgery without incident. His updated medication list for this problem includes:    Warfarin Sodium 5 Mg Tabs (Warfarin sodium) ..... Use as directed by anticoagulation clinic    Verapamil Hcl Cr 240 Mg Xr24h-cap (Verapamil hcl) .Marland Kitchen... Take one capsule by mouth daily    Altace 10 Mg Caps (Ramipril) ..... One by mouth two times a day    Aspirin 81 Mg Tbec (Aspirin) .Marland Kitchen... Take one tablet by mouth daily    His updated medication list for this problem includes:    Warfarin Sodium 5 Mg Tabs (Warfarin sodium) ..... Use as directed by anticoagulation clinic    Verapamil Hcl Cr 240 Mg Xr24h-cap (Verapamil hcl) .Marland Kitchen... Take one capsule by mouth daily    Altace 10 Mg Caps (Ramipril) ..... One by mouth two times a day    Aspirin 81 Mg Tbec (Aspirin) .Marland Kitchen... Take one tablet by  mouth daily  Orders: EKG w/ Interpretation (93000)  Problem # 2:  ATRIAL FIBRILLATION (ICD-427.31)  Controlled ventricular response.  In af today.  Remains on warfarin His updated medication list for this problem includes:    Warfarin Sodium 5 Mg Tabs (Warfarin sodium) ..... Use as directed by anticoagulation clinic    Aspirin 81 Mg Tbec (Aspirin) .Marland Kitchen... Take one tablet by mouth daily    His updated medication list for this problem includes:    Warfarin Sodium 5 Mg Tabs (Warfarin sodium) ..... Use as directed by anticoagulation clinic    Aspirin 81 Mg Tbec (Aspirin) .Marland Kitchen... Take one tablet by mouth daily  Orders: EKG w/ Interpretation (93000)  Problem #  3:  HYPERCHOLESTEROLEMIA  IIA (ICD-272.0)  Tolerated meds well.  Apppropriate dose for concomitant use of Verap.   Do not increase.   His updated medication list for this problem includes:    Lipitor 20 Mg Tabs (Atorvastatin calcium) .Marland Kitchen... Take one tablet by mouth daily.    Zetia 10 Mg Tabs (Ezetimibe) .Marland Kitchen... Take one tablet by mouth daily.    His updated medication list for this problem includes:    Lipitor 20 Mg Tabs (Atorvastatin calcium) .Marland Kitchen... Take one tablet by mouth daily.    Zetia 10 Mg Tabs (Ezetimibe) .Marland Kitchen... Take one tablet by mouth daily.  Orders: EKG w/ Interpretation (93000)  Problem # 4:  HYPERTENSION, BENIGN (ICD-401.1) Medications reviewed.  Relationship of calcium blockers to statins reviewed.  Continue to check at home which have been better than recorded. His updated medication list for this problem includes:    Verapamil Hcl Cr 240 Mg Xr24h-cap (Verapamil hcl) .Marland Kitchen... Take one capsule by mouth daily    Altace 10 Mg Caps (Ramipril) ..... One by mouth two times a day    Aspirin 81 Mg Tbec (Aspirin) .Marland Kitchen... Take one tablet by mouth daily    Hydrochlorothiazide 12.5 Mg Caps (Hydrochlorothiazide) .Marland Kitchen... Take 1 cap daily  Patient Instructions: 1)  Your physician recommends that you continue on your current medications as directed. Please refer to the Current Medication list given to you today. 2)  Your physician wants you to follow-up in:  6 MONTHS.  You will receive a reminder letter in the mail two months in advance. If you don't receive a letter, please call our office to schedule the follow-up appointment. Prescriptions: HYDROCHLOROTHIAZIDE 12.5 MG CAPS (HYDROCHLOROTHIAZIDE) take 1 cap daily  #90 x 4   Entered by:   Julieta Gutting, RN, BSN   Authorized by:   Ronaldo Miyamoto, MD, [facility]   Signed by:   Julieta Gutting, RN, BSN on 01/27/2009   Method used:   Electronically to        [facility]  [phone]* (retail)       [address] [place], [place]   [address]       Ph: [phone] or [phone]       Fax: [phone]   RxID:   [id number]

## 2010-03-17 NOTE — Progress Notes (Signed)
Summary: questions re going back on coumadin  Phone Note Call from Patient   Caller: Patient Reason for Call: Talk to Nurse Summary of Call: pt was to have knee surgery monday and was taken off coumadin, his surgery was cxl'd and he will see a vascular surgeon on wednesday then may rs surgery then, he wants to know when/if he needs to go back on coumadin? pla advise wife pat [phone] Initial call taken by: Glynda Jaeger,  February 18, 2010 9:19 AM  Follow-up for Phone Call        Spoke with wife she states that she's not sure of why Pt has to see Vascular Surgeon. He has been Scheduled for Partial knee replacement now it put on hold til Vascular appt. Thus, instructed pt to restart coumadin per Pharm D. Informed wife to call us next week and let us know the plan.  Follow-up by: Cloyde Reams RN,  February 18, 2010 10:42 AM

## 2010-03-17 NOTE — Progress Notes (Signed)
  Faxed 12,Echo,Lov over to [facility] @ [phone] [facility]  February 01, 2010 9:18 AM \

## 2010-03-17 NOTE — Medication Information (Signed)
Summary: rov/kh  Anticoagulant Therapy  Managed by: Weston Brass, PharmD Referring MD: Shawnie Pons MD PCP: Verdell Face MD: Daleen Squibb MD, Maisie Fus Indication 1: Atrial Fibrillation (ICD-427.31) Lab Used: [facility] Site: [facility] INR POC 2.2 INR RANGE 2.0-3.0  Dietary changes: no    Health status changes: no    Bleeding/hemorrhagic complications: no    Recent/future hospitalizations: no    Any changes in medication regimen? no    Recent/future dental: no  Any missed doses?: yes     Details: Missed 1/3 for procedure   Is patient compliant with meds? yes       Allergies: No Known Drug Allergies  Anticoagulation Management History:      The patient is taking warfarin and comes in today for a routine follow up visit.  Positive risk factors for bleeding include an age of 75 years or older.  The bleeding index is 'intermediate risk'.  Positive CHADS2 values include History of HTN and Age > 67 years old.  The start date was 04/16/2000.  His last INR was 2.1.  Anticoagulation responsible provider: Daleen Squibb MD, Maisie Fus.  INR POC: 2.2.  Cuvette Lot#: [id number].  Exp: 03/2011.    Anticoagulation Management Assessment/Plan:      The patient's current anticoagulation dose is Warfarin sodium 5 mg tabs: Use as directed by Anticoagulation Clinic.  The target INR is 2.0-3.0.  The next INR is due 03/28/2010.  Anticoagulation instructions were given to patient.  Results were reviewed/authorized by Weston Brass, PharmD.  He was notified by Stephannie Peters, PharmD Candidate .         Prior Anticoagulation Instructions: INR 1.5   Take 1.5 tablets today and tomorrow then continue current regimen of 1 tablet everday.   Will re-check INR in 4 weeks    Current Anticoagulation Instructions: INR 2.2  Coumadin 5 mg tablets - Continue 1 tablet daily

## 2010-03-17 NOTE — Medication Information (Signed)
Summary: rov/kh  Anticoagulant Therapy  Managed by: Darrick Penna Referring MD: Shawnie Pons MD PCP: Verdell Face MD: Patty Sermons, MD  Indication 1: Atrial Fibrillation (ICD-427.31) Lab Used: [facility] Site: [address] INR POC 1.5 INR RANGE 2.0-3.0  Dietary changes: yes       Details: feels like has had a little more greens than usual   Health status changes: no    Bleeding/hemorrhagic complications: no    Recent/future hospitalizations: yes       Details: partial knee replacement january 9, was told to stop taking coumadin 7 days prior   Any changes in medication regimen? yes       Details: now taking avodart   Recent/future dental: no  Any missed doses?: no       Is patient compliant with meds? yes       Allergies: No Known Drug Allergies  Anticoagulation Management History:      The patient is taking warfarin and comes in today for a routine follow up visit.  Positive risk factors for bleeding include an age of 75 years or older.  The bleeding index is 'intermediate risk'.  Positive CHADS2 values include History of HTN and Age > 75 years old.  The start date was 04/16/2000.  His last INR was 2.1.  Anticoagulation responsible provider: Patty Sermons, MD .  INR POC: 1.5.  Cuvette Lot#: [id number].  Exp: 01/2011.    Anticoagulation Management Assessment/Plan:      The patient's current anticoagulation dose is Warfarin sodium 5 mg tabs: Use as directed by Anticoagulation Clinic.  The target INR is 2.0-3.0.  The next INR is due 02/28/2010.  Anticoagulation instructions were given to patient.  Results were reviewed/authorized by Darrick Penna.         Prior Anticoagulation Instructions: INR 2.1 Continue same dose of 1tab 7days a week. Recheck in 4wks.  Current Anticoagulation Instructions: INR 1.5   Take 1.5 tablets today and tomorrow then continue current regimen of 1 tablet everday.   Will re-check INR in 4 weeks

## 2010-03-18 NOTE — Letter (Signed)
Summary: Tyler Williams   Imported By: Marylou Mccoy 11/25/2008 10:37:30  _____________________________________________________________________  External Attachment:    Type:   Image     Comment:   External Document

## 2010-03-28 ENCOUNTER — Encounter (INDEPENDENT_AMBULATORY_CARE_PROVIDER_SITE_OTHER): Payer: Medicare Other

## 2010-03-28 ENCOUNTER — Encounter: Payer: Self-pay | Admitting: Cardiology

## 2010-03-28 DIAGNOSIS — Z7901 Long term (current) use of anticoagulants: Secondary | ICD-10-CM

## 2010-03-28 DIAGNOSIS — I4891 Unspecified atrial fibrillation: Secondary | ICD-10-CM

## 2010-03-28 LAB — CONVERTED CEMR LAB: POC INR: 2.2

## 2010-04-06 NOTE — Medication Information (Signed)
Summary: Coumadin Clinic  Anticoagulant Therapy  Managed by: Weston Brass, PharmD Referring MD: Shawnie Pons MD PCP: Verdell Face MD: Jens Som MD, Arlys John Indication 1: Atrial Fibrillation (ICD-427.31) Lab Used: [facility] Site: [address] INR POC 2.2 INR RANGE 2.0-3.0  Dietary changes: no    Health status changes: no    Bleeding/hemorrhagic complications: no    Recent/future hospitalizations: no    Any changes in medication regimen? no    Recent/future dental: no  Any missed doses?: yes     Details: Missed coumadin on 2/1 and 2/2 and then restarted.  It was stopped for an angiogram  Is patient compliant with meds? yes      Comments: Waiting on approval for partial knee replacement.    Allergies: No Known Drug Allergies  Anticoagulation Management History:      The patient is taking warfarin and comes in today for a routine follow up visit.  Positive risk factors for bleeding include an age of 75 years or older.  The bleeding index is 'intermediate risk'.  Positive CHADS2 values include History of HTN and Age > 27 years old.  The start date was 04/16/2000.  His last INR was 2.1.  Anticoagulation responsible provider: Jens Som MD, Arlys John.  INR POC: 2.2.  Cuvette Lot#: [id number].  Exp: 02/2011.    Anticoagulation Management Assessment/Plan:      The patient's current anticoagulation dose is Warfarin sodium 5 mg tabs: Use as directed by Anticoagulation Clinic.  The target INR is 2.0-3.0.  The next INR is due 04/25/2010.  Anticoagulation instructions were given to patient.  Results were reviewed/authorized by Weston Brass, PharmD.  He was notified by Margot Chimes PharmD candidate.         Prior Anticoagulation Instructions: INR 2.2  Coumadin 5 mg tablets - Continue 1 tablet daily   Current Anticoagulation Instructions: INR 2.2  Continue current dose of 1 tablet everyday.  Recheck INR in 4 weeks.

## 2010-04-15 ENCOUNTER — Emergency Department (HOSPITAL_COMMUNITY)
Admission: EM | Admit: 2010-04-15 | Discharge: 2010-04-15 | Disposition: A | Discharge status: Home / Self Care | Payer: Medicare Other | Attending: Emergency Medicine | Admitting: Emergency Medicine

## 2010-04-15 DIAGNOSIS — I4891 Unspecified atrial fibrillation: Secondary | ICD-10-CM | POA: Insufficient documentation

## 2010-04-15 DIAGNOSIS — R339 Retention of urine, unspecified: Secondary | ICD-10-CM | POA: Insufficient documentation

## 2010-04-15 DIAGNOSIS — I10 Essential (primary) hypertension: Secondary | ICD-10-CM | POA: Insufficient documentation

## 2010-04-15 DIAGNOSIS — R319 Hematuria, unspecified: Secondary | ICD-10-CM | POA: Insufficient documentation

## 2010-04-15 DIAGNOSIS — E7800 Pure hypercholesterolemia, unspecified: Secondary | ICD-10-CM | POA: Insufficient documentation

## 2010-04-15 LAB — URINALYSIS, ROUTINE W REFLEX MICROSCOPIC
Bilirubin Urine: NEGATIVE
Glucose, UA: NEGATIVE mg/dL
Nitrite: NEGATIVE
Protein, ur: 100 mg/dL — AB
Specific Gravity, Urine: 1.016 (ref 1.005–1.030)
Urobilinogen, UA: 0.2 mg/dL (ref 0.0–1.0)
pH: 5.5 (ref 5.0–8.0)

## 2010-04-15 LAB — URINE MICROSCOPIC-ADD ON

## 2010-04-18 ENCOUNTER — Encounter (INDEPENDENT_AMBULATORY_CARE_PROVIDER_SITE_OTHER): Payer: Medicare Other

## 2010-04-18 ENCOUNTER — Encounter: Payer: Self-pay | Admitting: Cardiology

## 2010-04-18 ENCOUNTER — Ambulatory Visit (HOSPITAL_COMMUNITY)
Admission: RE | Admit: 2010-04-18 | Discharge: 2010-04-18 | Disposition: A | Discharge status: Home / Self Care | Payer: Medicare Other | Source: Ambulatory Visit | Attending: Urology | Admitting: Urology

## 2010-04-18 DIAGNOSIS — Z7901 Long term (current) use of anticoagulants: Secondary | ICD-10-CM

## 2010-04-18 DIAGNOSIS — M7989 Other specified soft tissue disorders: Secondary | ICD-10-CM | POA: Insufficient documentation

## 2010-04-18 DIAGNOSIS — M79609 Pain in unspecified limb: Secondary | ICD-10-CM | POA: Insufficient documentation

## 2010-04-18 DIAGNOSIS — I4891 Unspecified atrial fibrillation: Secondary | ICD-10-CM

## 2010-04-18 LAB — CONVERTED CEMR LAB: POC INR: 1.4

## 2010-04-20 ENCOUNTER — Encounter (INDEPENDENT_AMBULATORY_CARE_PROVIDER_SITE_OTHER): Payer: Medicare Other

## 2010-04-20 ENCOUNTER — Encounter: Payer: Self-pay | Admitting: Cardiology

## 2010-04-20 DIAGNOSIS — Z7901 Long term (current) use of anticoagulants: Secondary | ICD-10-CM

## 2010-04-20 DIAGNOSIS — I4891 Unspecified atrial fibrillation: Secondary | ICD-10-CM

## 2010-04-20 LAB — CONVERTED CEMR LAB: POC INR: 2.7

## 2010-04-26 NOTE — Medication Information (Signed)
Summary: rov/tm  Anticoagulant Therapy  Managed by: Bethena Midget, RN, BSN Referring MD: Shawnie Pons MD PCP: Verdell Face MD: Daleen Squibb MD, Maisie Fus Indication 1: Atrial Fibrillation (ICD-427.31) Lab Used: [facility] Site: [address] INR POC 1.4 INR RANGE 2.0-3.0  Dietary changes: no    Health status changes: yes       Details: Edema has increased in Rt leg from knee down since he has came home, it is pitting. Thus,infromed nurse at [facility] and she states that the NP will call pt this afternoon.   Bleeding/hemorrhagic complications: yes       Details: Hematuria since Cath was placed at [facility], it was removed on Friday but restarted and went to ER at [facility] and it was replaced. See Dr Kimbro(Urology) at 1pm..  Recent/future hospitalizations: no    Any changes in medication regimen? no    Recent/future dental: no  Any missed doses?: no       Is patient compliant with meds? yes      Comments: [facility] into Rt [facility] D/c home on Friday. Restarted Thursday post Sx. and Lovenox Q12hrs. last dose this AM. [facility] and they want to continue Lovenox Q12hrs until pt is therapeutic. Thus, Lovenox reorder.   Allergies: No Known Drug Allergies  Anticoagulation Management History:      The patient is taking warfarin and comes in today for a routine follow up visit.  Positive risk factors for bleeding include an age of 36 years or older.  The bleeding index is 'intermediate risk'.  Positive CHADS2 values include History of HTN and Age > 80 years old.  The start date was 04/16/2000.  His last INR was 2.1.  Anticoagulation responsible provider: Daleen Squibb MD, Maisie Fus.  INR POC: 1.4.  Cuvette Lot#: [id number].  Exp: 02/2011.    Anticoagulation Management Assessment/Plan:      The patient's current anticoagulation dose is Warfarin sodium 5 mg tabs: Use as directed by Anticoagulation Clinic.  The  target INR is 2.0-3.0.  The next INR is due 04/21/2010.  Anticoagulation instructions were given to patient.  Results were reviewed/authorized by Bethena Midget, RN, BSN.  He was notified by Bethena Midget, RN, BSN.         Prior Anticoagulation Instructions: INR 2.2  Continue current dose of 1 tablet everyday.  Recheck INR in 4 weeks.    Current Anticoagulation Instructions: INR 1.4 Today and Tuesday take 7.5mg s then resume 5mg s daily. Recheck INR on Thursday. Continue Lovenox 90mg s Subcutaneously every 12 hourss.  4Prescriptions: LOVENOX 100 MG/ML SOLN (ENOXAPARIN SODIUM) Take 90mg s Subcutaneously everyday 12 hours.  #6 x 0   Entered by:   Bethena Midget, RN, BSN   Authorized by:   Ronaldo Miyamoto, MD, [facility]   Signed by:   Bethena Midget, RN, BSN on 04/18/2010   Method used:   Electronically to        [facility]* (retail)       [address]       [place], [place]  [id number]       Ph: [phone]       Fax: [phone]   RxID:   [id number]   Appended Document: rov/tm Pt's wife called back to report pt on Azithromycin 500mg  x 3 days.  Informed her may increase INR some but continue with same plan since INR subtherapeutic today.

## 2010-04-26 NOTE — Medication Information (Signed)
Summary: rov/ewj  Anticoagulant Therapy  Managed by: Windell Hummingbird, RN Referring MD: Shawnie Pons MD PCP: Verdell Face MD: Myrtis Ser MD, Tinnie Gens Indication 1: Atrial Fibrillation (ICD-427.31) Lab Used: [facility] Site: [address] INR POC 2.7 INR RANGE 2.0-3.0  Dietary changes: no    Health status changes: no    Bleeding/hemorrhagic complications: yes       Details: Bleeding improved/ not visible in urine.  Followed by urologist  Recent/future hospitalizations: no    Any changes in medication regimen? no    Recent/future dental: no  Any missed doses?: no       Is patient compliant with meds? yes       Allergies: No Known Drug Allergies  Anticoagulation Management History:      The patient is taking warfarin and comes in today for a routine follow up visit.  Positive risk factors for bleeding include an age of 75 years or older.  The bleeding index is 'intermediate risk'.  Positive CHADS2 values include History of HTN and Age > 75 years old.  The start date was 04/16/2000.  His last INR was 2.1.  Anticoagulation responsible provider: Myrtis Ser MD, Tinnie Gens.  INR POC: 2.7.  Cuvette Lot#: [id number].  Exp: 02/2011.    Anticoagulation Management Assessment/Plan:      The patient's current anticoagulation dose is Warfarin sodium 5 mg tabs: Use as directed by Anticoagulation Clinic.  The target INR is 2.0-3.0.  The next INR is due 05/04/2010.  Anticoagulation instructions were given to patient.  Results were reviewed/authorized by Windell Hummingbird, RN.  He was notified by Windell Hummingbird, RN.         Prior Anticoagulation Instructions: INR 1.4 Today and Tuesday take 7.5mg s then resume 5mg s daily. Recheck INR on Thursday. Continue Lovenox 90mg s Subcutaneously every 12 hourss.   Current Anticoagulation Instructions: INR 2.7 Continue taking 1 tablet every day. Recheck in 2 weeks.

## 2010-05-04 ENCOUNTER — Ambulatory Visit (INDEPENDENT_AMBULATORY_CARE_PROVIDER_SITE_OTHER): Payer: Medicare Other | Admitting: *Deleted

## 2010-05-04 DIAGNOSIS — I4891 Unspecified atrial fibrillation: Secondary | ICD-10-CM

## 2010-05-04 LAB — POCT INR: INR: 2.1

## 2010-05-04 NOTE — Patient Instructions (Signed)
INR 2.1 Continue taking 5 mg every day.  Recheck in 4 weeks.

## 2010-05-17 ENCOUNTER — Other Ambulatory Visit: Payer: Self-pay | Admitting: *Deleted

## 2010-05-17 MED ORDER — ATORVASTATIN CALCIUM 20 MG PO TABS: 20.0000 mg | ORAL_TABLET | Freq: Every day | ORAL | Status: DC

## 2010-05-20 LAB — URINE MICROSCOPIC-ADD ON

## 2010-05-20 LAB — PROTIME-INR
INR: 1.3 (ref 0.00–1.49)
INR: 1.1 (ref 0.00–1.49)
INR: 1.2 (ref 0.00–1.49)
INR: 2.9 — ABNORMAL HIGH (ref 0.00–1.49)
Prothrombin Time: 15.6 seconds — ABNORMAL HIGH (ref 11.6–15.2)
Prothrombin Time: 14.2 seconds (ref 11.6–15.2)
Prothrombin Time: 14.6 seconds (ref 11.6–15.2)
Prothrombin Time: 30.3 seconds — ABNORMAL HIGH (ref 11.6–15.2)

## 2010-05-20 LAB — BASIC METABOLIC PANEL
BUN: 19 mg/dL (ref 6–23)
BUN: 18 mg/dL (ref 6–23)
BUN: 21 mg/dL (ref 6–23)
CO2: 25 mEq/L (ref 19–32)
CO2: 27 mEq/L (ref 19–32)
CO2: 29 mEq/L (ref 19–32)
Calcium: 7.9 mg/dL — ABNORMAL LOW (ref 8.4–10.5)
Calcium: 7.9 mg/dL — ABNORMAL LOW (ref 8.4–10.5)
Calcium: 9.1 mg/dL (ref 8.4–10.5)
Chloride: 98 mEq/L (ref 96–112)
Chloride: 101 mEq/L (ref 96–112)
Chloride: 107 mEq/L (ref 96–112)
Creatinine, Ser: 0.89 mg/dL (ref 0.4–1.5)
Creatinine, Ser: 0.81 mg/dL (ref 0.4–1.5)
Creatinine, Ser: 0.93 mg/dL (ref 0.4–1.5)
GFR calc Af Amer: >60 mL/min (ref >60)
GFR calc Af Amer: >60 mL/min (ref >60)
GFR calc Af Amer: >60 mL/min (ref >60)
GFR calc non Af Amer: >60 mL/min (ref >60)
GFR calc non Af Amer: >60 mL/min (ref >60)
GFR calc non Af Amer: >60 mL/min (ref >60)
Glucose, Bld: 106 mg/dL — ABNORMAL HIGH (ref 70–99)
Glucose, Bld: 102 mg/dL — ABNORMAL HIGH (ref 70–99)
Glucose, Bld: 70 mg/dL (ref 70–99)
Potassium: 4.8 mEq/L (ref 3.5–5.1)
Potassium: 4.4 mEq/L (ref 3.5–5.1)
Potassium: 4.6 mEq/L (ref 3.5–5.1)
Sodium: 128 mEq/L — ABNORMAL LOW (ref 135–145)
Sodium: 133 mEq/L — ABNORMAL LOW (ref 135–145)
Sodium: 139 mEq/L (ref 135–145)

## 2010-05-20 LAB — CBC
HCT: 39.5 % (ref 39.0–52.0)
HCT: 39.9 % (ref 39.0–52.0)
HCT: 47.3 % (ref 39.0–52.0)
Hemoglobin: 13.6 g/dL (ref 13.0–17.0)
Hemoglobin: 13.6 g/dL (ref 13.0–17.0)
Hemoglobin: 16.2 g/dL (ref 13.0–17.0)
MCHC: 34.3 g/dL (ref 30.0–36.0)
MCHC: 34.2 g/dL (ref 30.0–36.0)
MCHC: 34.2 g/dL (ref 30.0–36.0)
MCV: 98.7 fL (ref 78.0–100.0)
MCV: 100 fL (ref 78.0–100.0)
MCV: 98.5 fL (ref 78.0–100.0)
Platelets: 124 10*3/uL — ABNORMAL LOW (ref 150–400)
Platelets: 127 10*3/uL — ABNORMAL LOW (ref 150–400)
Platelets: 140 10*3/uL — ABNORMAL LOW (ref 150–400)
RBC: 4 MIL/uL — ABNORMAL LOW (ref 4.22–5.81)
RBC: 3.99 MIL/uL — ABNORMAL LOW (ref 4.22–5.81)
RBC: 4.8 MIL/uL (ref 4.22–5.81)
RDW: 13.7 % (ref 11.5–15.5)
RDW: 13.4 % (ref 11.5–15.5)
RDW: 13.4 % (ref 11.5–15.5)
WBC: 10.5 10*3/uL (ref 4.0–10.5)
WBC: 6.5 10*3/uL (ref 4.0–10.5)
WBC: 8.8 10*3/uL (ref 4.0–10.5)

## 2010-05-20 LAB — DIFFERENTIAL
Basophils Absolute: 0.1 10*3/uL (ref 0.0–0.1)
Basophils Relative: 1 % (ref 0–1)
Eosinophils Absolute: 0.1 10*3/uL (ref 0.0–0.7)
Eosinophils Relative: 1 % (ref 0–5)
Lymphocytes Relative: 26 % (ref 12–46)
Lymphs Abs: 1.7 10*3/uL (ref 0.7–4.0)
Monocytes Absolute: 0.8 10*3/uL (ref 0.1–1.0)
Monocytes Relative: 12 % (ref 3–12)
Neutro Abs: 3.9 10*3/uL (ref 1.7–7.7)
Neutrophils Relative %: 60 % (ref 43–77)

## 2010-05-20 LAB — TYPE AND SCREEN
ABO/RH(D): A POS
Antibody Screen: NEGATIVE

## 2010-05-20 LAB — URINALYSIS, ROUTINE W REFLEX MICROSCOPIC
Bilirubin Urine: NEGATIVE
Glucose, UA: NEGATIVE mg/dL
Ketones, ur: NEGATIVE mg/dL
Leukocytes, UA: NEGATIVE
Nitrite: NEGATIVE
Protein, ur: NEGATIVE mg/dL
Specific Gravity, Urine: 1.023 (ref 1.005–1.030)
Urobilinogen, UA: 0.2 mg/dL (ref 0.0–1.0)
pH: 5 (ref 5.0–8.0)

## 2010-05-20 LAB — APTT
aPTT: 27 seconds (ref 24–37)
aPTT: 37 seconds (ref 24–37)

## 2010-05-20 LAB — ABO/RH: ABO/RH(D): A POS

## 2010-06-01 ENCOUNTER — Encounter: Payer: Medicare Other | Admitting: *Deleted

## 2010-06-03 ENCOUNTER — Encounter: Payer: Self-pay | Admitting: *Deleted

## 2010-06-03 ENCOUNTER — Encounter: Payer: Self-pay | Admitting: Cardiology

## 2010-06-06 ENCOUNTER — Ambulatory Visit (INDEPENDENT_AMBULATORY_CARE_PROVIDER_SITE_OTHER): Payer: Medicare Other | Admitting: Cardiology

## 2010-06-06 ENCOUNTER — Encounter: Payer: Self-pay | Admitting: Cardiology

## 2010-06-06 ENCOUNTER — Encounter: Payer: Medicare Other | Admitting: *Deleted

## 2010-06-06 ENCOUNTER — Ambulatory Visit: Payer: Medicare Other | Admitting: Cardiology

## 2010-06-06 ENCOUNTER — Encounter (INDEPENDENT_AMBULATORY_CARE_PROVIDER_SITE_OTHER): Payer: Medicare Other | Admitting: *Deleted

## 2010-06-06 DIAGNOSIS — E7800 Pure hypercholesterolemia, unspecified: Secondary | ICD-10-CM

## 2010-06-06 DIAGNOSIS — I10 Essential (primary) hypertension: Secondary | ICD-10-CM

## 2010-06-06 DIAGNOSIS — I4891 Unspecified atrial fibrillation: Secondary | ICD-10-CM

## 2010-06-06 DIAGNOSIS — I2510 Atherosclerotic heart disease of native coronary artery without angina pectoris: Secondary | ICD-10-CM

## 2010-06-06 LAB — POCT INR: INR: 2

## 2010-06-06 MED ORDER — ATORVASTATIN CALCIUM 20 MG PO TABS: 20.0000 mg | ORAL_TABLET | Freq: Every day | ORAL | Status: DC

## 2010-06-06 MED ORDER — VERAPAMIL HCL 240 MG PO CP24: 240.0000 mg | ORAL_CAPSULE | Freq: Every day | ORAL | Status: DC

## 2010-06-06 NOTE — Patient Instructions (Signed)
Your physician wants you to follow-up in: 6 MONTHS.  You will receive a reminder letter in the mail two months in advance. If you don't receive a letter, please call our office to schedule the follow-up appointment.  Your physician recommends that you continue on your current medications as directed. Please refer to the Current Medication list given to you today.  

## 2010-06-08 NOTE — Assessment & Plan Note (Signed)
Never knows when he is in atrial fibrillation, but this is tolerated well when he is.  On appropriate anticoagulation.  We discussed the use of dabigatran as he has asked; he uses NSAIDS, and has been reliable on warfarin.  Given potential for GI, we will continue on our current path of treatment.

## 2010-06-08 NOTE — Assessment & Plan Note (Signed)
Not having symptoms, plays golf without difficulty, and overall seems to be doing well.  No chest pain at present.  Tolerated surgery.

## 2010-06-08 NOTE — Assessment & Plan Note (Signed)
Remains on low dose atorvastatin.  Noted use of verapamil.  Has tolerated for many years without difficulty.  Continue to observe.

## 2010-06-08 NOTE — Assessment & Plan Note (Signed)
Seems reasonably controlled at the present time.

## 2010-06-08 NOTE — Progress Notes (Signed)
HPI:  Current Outpatient Prescriptions  Medication Sig Dispense Refill  . atorvastatin (LIPITOR) 20 MG tablet Take 1 tablet (20 mg total) by mouth daily.  60 tablet  6  . Cholecalciferol (VITAMIN D-3) 5000 UNITS TABS Take 5,000 Units by mouth daily.        Marland Kitchen ezetimibe (ZETIA) 10 MG tablet Take 10 mg by mouth daily.        . hydrochlorothiazide (,MICROZIDE/HYDRODIURIL,) 12.5 MG capsule Take 12.5 mg by mouth daily.        . Ibuprofen (MOTRIN PO) Take by mouth as needed.        Marland Kitchen LORazepam (ATIVAN) 1 MG tablet Take 1 mg by mouth daily.       . Multiple Vitamins-Minerals (PRESERVISION/LUTEIN PO) Take by mouth 2 (two) times daily.        . Omega-3 Fatty Acids (FISH OIL) 300 MG CAPS Take 300 mg by mouth 2 (two) times daily.        . Psyllium (METAMUCIL) 30.9 % POWD Take by mouth as needed.        . ramipril (ALTACE) 10 MG capsule Take 10 mg by mouth 2 (two) times daily.       . verapamil (VERELAN PM) 240 MG 24 hr capsule Take 1 capsule (240 mg total) by mouth daily.  60 capsule  6  . warfarin (COUMADIN) 5 MG tablet Take by mouth as directed.          No Known Allergies  Past Medical History  Diagnosis Date  . CAD (coronary artery disease)     with multiple percutaneous prior coronary  artery interventions  . PAF (paroxysmal atrial fibrillation)   . PVD (peripheral vascular disease)   . Hypercholesteremia   . SSS (sick sinus syndrome)   . Edema     Past Surgical History  Procedure Date  . Angioplasty   . Orthopedic surgery     No family history on file.  History   Social History  . Marital Status: Married    Spouse Name: N/A    Number of Children: N/A  . Years of Education: N/A   Occupational History  . Not on file.   Social History Main Topics  . Smoking status: Former Smoker    Types: Cigarettes    Quit date: 02/14/1984  . Smokeless tobacco: Not on file  . Alcohol Use: No  . Drug Use: Yes  . Sexually Active: Not on file   Other Topics Concern  . Not on file    Social History Narrative  . No narrative on file    ROS: Please see the HPI.  All other systems reviewed and negative.  PHYSICAL EXAM:  BP 138/70  Pulse 67  Resp 18  Ht 5\' 10"  (1.778 m)  Wt 200 lb (90.719 kg)  BMI 28.70 kg/m2  General: Well developed, well nourished, in no acute distress. Head:  Normocephalic and atraumatic. Neck: no JVD Lungs: Clear to auscultation and percussion. Heart: irregularly irregular rhythm.  Soft SEM.  No DM.   Abdomen:  Normal bowel sounds; soft; non tender; no organomegaly Pulses: Pulses normal in all 4 extremities.   Extremities: No clubbing or cyanosis. No edema.  Incision site from surgery looks good. Neurologic: Alert and oriented x 3.  EKG:  Atrial fibrillation.  RBBB.  Nonspecific ST and T abnormalities.   ASSESSMENT AND PLAN:

## 2010-06-27 ENCOUNTER — Other Ambulatory Visit: Payer: Self-pay | Admitting: Cardiology

## 2010-06-28 NOTE — Assessment & Plan Note (Signed)
[facility]                            CARDIOLOGY OFFICE NOTE   Tyler, Williams                      MRN:          [id number]  DATE:11/19/2006                            DOB:          01-25-1927    Mr. Tyler Williams is in for a followup visit.  In general he is stable, he has  not been having any ongoing chest pain, he has been playing a lot of  golf.  He feels well.  His blood pressures do run a little high at times  from the 140's/160's.  We talked about possible strategies to bring this  down.   Today on examination the blood pressure is 160/74, the pulse is 56, the  lung fields are clear.  There is a soft left carotid bruit.   The extremities reveal no edema.   EKG reveals right bundle branch block.   IMPRESSION:  1. Hypertension, moderate control.  2. Hypercholesterolemia, on lipid-lowering therapy.  3. Coronary artery disease with prior percutaneous coronary      intervention.  4. Paroxysmal atrial fibrillation.  5. Soft left carotid bruit.   PLAN:  1. Add hydrochlorothiazide 12.5 mg daily.  2. Check basic metabolic profile in 1 week.  3. Check lipid profile.  4. Return to clinic in 1 month.  5. Carotid Doppler after next visit to followup on previous carotid      Dopplers of 2006.     Arturo Morton. Riley Kill, MD, [facility]  Electronically Signed    TDS/MedQ  DD: 11/19/2006  DT: 11/19/2006  Job #: [id number]

## 2010-06-28 NOTE — Procedures (Signed)
[facility]                              EXERCISE TREADMILL   NAME:COOKECoby, Shrewsberry                      MRN:          [id number]  DATE:12/12/2007                            DOB:          04/28/1926    DURATION OF EXERCISE:  Six minutes.   MAXIMUM HEART RATE:  123.   PERCENT OF PREDICTED MAXIMAL HEART RATE:  87%.   COMMENTS:  Mr. Feister exercised on the Bruce protocol today.  He  experienced no chest pain.  The test was terminated due to fatigue.  At  maximum stress, the patient did go into atrial fibrillation.  We kept  him in the laboratory for an hour or so after that, and he did remain in  atrial fib.  He has a history of going in and out, and he generally does  not feel when he does go out.  However, he tolerated all of this well  and there were no side effects.  The study itself did not suggest  significant horizontal ST depression.  As a result, it would be  reasonable to clear for surgery at the present time.  It has actually  been quite some time since he underwent repeat cardiac catheterization.  He has had prior percutaneous intervention as well as peripheral  vascular intervention, but currently has no chest pain, no exercise-  induced ST depression.  He has had peripheral vascular disease and  underwent abdominal aortic aneurysm resection in 1991 by Dr. Hart Rochester and  had a PTA of the left superficial femoral artery in November 1997.  He  underwent angioplasty in May 1986, and had redo's in 1987 and 1992, but  his last catheterization in August 1993 had only 40-50% right, 20%  marginal, and 40-50% LAD.  At the present time, he is doing well.  I  think he is stable to undergo surgery, but he does understand that there  is always some mild increased risk.  He has had a mildly abnormal  echocardiogram.  He has a nodule on his aortic valve, but we did not  think that it was a vegetation as there was no clinical evidence to  suggest this.   During his surgery, he should not come off of Coumadin  and he will remain on verapamil.     Arturo Morton. Riley Kill, MD, [facility]  Electronically Signed    TDS/MedQ  DD: 12/12/2007  DT: 12/13/2007  Job #: [id number]

## 2010-06-28 NOTE — Assessment & Plan Note (Signed)
[facility]                            CARDIOLOGY OFFICE NOTE   WARNIE, BELAIR                      MRN:          [id number]  DATE:10/17/2007                            DOB:          [birth year]    Mr. Tyler Williams is in for followup visit.  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  He has no major limitations.  He denies any current  symptoms and appears to be tolerating his warfarin anticoagulation  without difficulty.  He has a physical scheduled with Dr. Artis Flock in  followup.   MEDICATIONS:  Currently include;  1. Altace 10 mg p.o. b.i.d.  2. Verapamil 240 mg daily.  3. Lipitor 20 mg nightly.  4. Zetia 10 mg daily.  5. Hydrochlorothiazide 12.5 mg daily.  6. CoQ-10 daily.  7. Coumadin as directed.   LABORATORY STUDIES:  The patient last had laboratory work done in  February 2009.  At which time, his potassium was 4.4, BUN 15, and  creatinine 0.8.  His hemoglobin was normal at that time.  His HDL was 46  and his LDL was 51.   PHYSICAL EXAMINATION:  GENERAL:  He appears well.  VITAL SIGNS:  His weight is 202 pounds, the blood pressure 132/66, and  the pulse is 44.  LUNGS:  His lung fields are clear to auscultation and percussion.  CARDIAC:  Rhythm is regular.  There is a soft systolic ejection murmur  that is minimal with no diastolic murmurs appreciated.  EXTREMITIES:  No edema.   IMPRESSION:  1. Coronary artery disease with multiple percutaneous prior coronary      artery interventions.  2. History of paroxysmal atrial fibrillation  3. Peripheral vascular disease.  4. Hypercholesterolemia.  5. Probable sick sinus syndrome.   PLAN:  1. Return to clinic in 6 months.  2. We will need to keep a fairly close eye on Tyler Williams's heart rate.  He      is currently taking a verapamil, having been on this for many many      years.  I will have him check his heart rates       at home and if he remains in the low 40s, it may necessitate      cutting back on his treatment and substituting something else for      his hypertension and PAF.     Arturo Morton. Riley Kill, MD, [facility]  Electronically Signed    TDS/MedQ  DD: 10/19/2007  DT: 10/19/2007  Job #: [id number]   cc:   Quita Skye. Artis Flock, M.D.

## 2010-06-28 NOTE — Assessment & Plan Note (Signed)
[facility]                            CARDIOLOGY OFFICE NOTE   VERNON, MAISH                      MRN:          [id number]  DATE:01/01/2007                            DOB:          1926/04/25    Mr. Tyler Williams is in for followup.  He is working and Naval architect on a  regular basis.  He plays golf two times a week.  Blood pressure at work  was 130/78.  He has had recent laboratory studies.  Potassium was normal  at 4.7 and BUN and creatinine were normal, as well.   MEDICATIONS INCLUDE:  1. Altace 10 mg b.i.d.  2. Verapamil 240 mg daily.  3. Co-Q10 30 mg daily.  4. Coumadin 5 mg daily.  5. Aspirin 81 mg daily.  6. Lipitor 20 mg q.p.m.  7. Zetia 10 mg q.p.m.  8. Hydrochlorothiazide 12.5 daily.   Of note, we have talked about replacing his Verapamil, given potential  for drug interaction, but the patient likes it and is has worked over a  long period of time.   PHYSICAL TODAY:  The blood pressure is 140/80, pulse is 57.  Lung fields are clear.  The cardiac rhythm is regular.  There is a soft systolic ejection  murmur, compatible with aortic valve sclerosis.  No diastolic murmur is  appreciated.  EXTREMITIES:  Reveal trace edema in the right lower extremity, otherwise  unremarkable.   CONCLUSIONS:  1. Hypertension, moderately good control.  2. Hypercholesterolemia, on lipid-lowering therapy.  3. Coronary artery disease, status post percutaneous coronary      intervention.   PLAN:  1. Return to clinic in three to four months.  2. Patient is entered into recall for followup carotid within the next      month.  3. He is comfortable with his current medical regimen.  We  will not      make any changes.     Arturo Morton. Riley Kill, MD, [facility]  Electronically Signed    TDS/MedQ  DD: 01/01/2007  DT: 01/01/2007  Job #: [id number]

## 2010-06-28 NOTE — Assessment & Plan Note (Signed)
[facility]                            CARDIOLOGY OFFICE NOTE   Tyler Williams, Tyler Williams                      MRN:          [id number]  DATE:04/23/2008                            DOB:          1927-01-04    HISTORY OF PRESENT ILLNESS:  Tyler Williams is in for a followup visit.  To  briefly summarize, he has been stable.  He has not been having any  ongoing chest pain.  He has been doing all kinds of activities,  including golf several times a week.  He generally feels well.  He has  had trouble with his knees, and at times would like to take a  nonsteroidal specifically in order to be able to play golf.  He is also  talked about the possibility of eventually requiring knee replacements.   MEDICATIONS:  1. Altace 10 mg daily.  2. Verapamil 240 mg daily.  3. Lipitor 20 mg q.p.m.  4. Zetia 10 mg q.p.m.  5. Hydrochlorothiazide 12.5 daily.  6. CoQ10 100 mg daily.  7. Coumadin as directed.   PHYSICAL EXAMINATION:  He is alert and oriented in no acute distress.  Blood pressure is 150/70.  The pulse is 51.  The lung fields are clear.  There is a minimal systolic ejection murmur.  No diastolic murmurs are  appreciated.  Distal pulses are intact.   DIAGNOSTIC STUDIES:  The electrocardiogram demonstrates sinus  bradycardia with right bundle-branch block.   IMPRESSION:  1. Known coronary artery disease with prior percutaneous coronary      intervention.  2. History of paroxysmal atrial fibrillation.  3. Probable sick sinus syndrome.  4. History of peripheral vascular disease.  5. Hypercholesterolemia.  6. Aortic sclerosis.   PLAN:  1. Return to clinic in 6 months.  2. At that time, we will suggest that he have an echocardiogram as the      last one has been done in 2006.  3. He might be a candidate for knee surgery if necessary, but we had a      thorough discussion about potential risks at this age.     Tyler Williams. Riley Kill, MD, [facility]  Electronically  Signed    TDS/MedQ  DD: 04/24/2008  DT: 04/24/2008  Job #: [id number]

## 2010-06-28 NOTE — Assessment & Plan Note (Signed)
[facility]                            CARDIOLOGY OFFICE NOTE   LORETTA, KLUENDER                      MRN:          [id number]  DATE:04/16/2007                            DOB:          1926-04-03    Mr. Borowiak is in for follow-up. In general, he has been getting along  well.  He has been playing golf pretty regularly and not having much  difficulties.  He quit smoking in 1985.  He remains on Coumadin  anticoagulation currently.  Symptom wise he is getting along well.  He  had recent laboratory studies done in Dr. Blair Heys office.  This revealed  a hemoglobin of 15.8, platelet count was 175,000, BUN and creatinine  were normal and potassium was 4.4.  Lipid profile revealed a cholesterol  of 123, HDL of 46 and an LDL of 51.  His PSA was 1.70.   PHYSICAL EXAMINATION:  Blood pressure is 154/70, pulse is 55.  The lung fields are clear.  Cardiac rhythm is regular. S4 gallop was noted.   Electrocardiogram demonstrates sinus bradycardia with a right bundle  branch block.  Compared to prior tracings, no interval changes noted  from October 2008.   IMPRESSION:  1. Known advanced coronary artery disease currently stable.  2. Hypercholesterolemia under excellent lipid lowering therapy.  3. History of paroxysmal atrial fibrillation.  4. Hypertension.   PLAN:  1. Continue current medical regimen.  2. Continued follow-up Dopplers an additional 2 years.     Arturo Morton. Riley Kill, MD, [facility]  Electronically Signed    TDS/MedQ  DD: 05/06/2007  DT: 05/06/2007  Job #: [id number]   cc:   Quita Skye. Artis Flock, M.D.

## 2010-07-01 NOTE — Assessment & Plan Note (Signed)
[facility]                              CARDIOLOGY OFFICE NOTE   Tyler, Williams                      MRN:          [id number]  DATE:11/16/2005                            DOB:          Jan 16, 1927    Tyler Williams is in for followup.  He has been stable.  He has been playing golf  twice a week.  He denies any chest pain or significant shortness of breath.  His lipid profile revealed an LDL of 74.  Overall he seems to be doing well  without difficulty.   PHYSICAL EXAMINATION:  VITAL SIGNS: Blood pressure 136/75, pulse 52.  LUNGS:  The lung fields are clear.  CARDIAC:  Cardiac rhythm is regular.  I do not appreciate a significant  mitral regurgitation murmur.   The last echocardiogram done nearly two years ago revealed trace MR.   IMPRESSION:  1. Coronary artery disease, status post multivessel percutaneous coronary      intervention.  2. Hypertension on medical therapy.  3. Paroxysmal atrial fibrillation on Coumadin anticoagulation.  4. Hyperlipidemia on medical therapy.   PLAN:  1. Return to clinic in six months.  2. Continue current medical regimen.   EKG reveals sinus bradycardia with right bundle branch block.       Arturo Morton. Riley Kill, MD, [facility]     TDS/MedQ  DD:  11/16/2005  DT:  11/17/2005  Job #:  [id number]

## 2010-07-01 NOTE — Assessment & Plan Note (Signed)
[facility]                            CARDIOLOGY OFFICE NOTE   ARTHUR, SPEAGLE                      MRN:          [id number]  DATE:05/17/2006                            DOB:          [birth year]    Mr. Lomeli is in for a followup visit.  He was recently seen in the  emergency room.  He had gone walking in [facility], and his left leg  gave way.  He said he did not have any syncope or presyncope.  He just  developed weakness in his left leg.  Initially he could not get up.  There was a question of whether there was some back involvement.  He had  no other obvious symptoms.  He finally was able to regain his strength.  He was seen in the emergency room. CAT scan was done, and a repeat CAT  scan done the next day.  On the first CAT scan there was a question of a  small hemorrhage, but this was confirmed on the next day's CAT scan.  There was a 5 mm left basal ganglia and lacunar infarct and stable age-  related cerebral atrophy with no acute intracranial abnormality noted.  He has had some laboratory studies done in  Dr. Blair Heys office.  His hemoglobin was 17.6.  His LDL cholesterol was  51.  The patient continues to do well from a cardiac standpoint and is  not specifically symptomatic.  He denies chest pain.  He is able to go  about his activities.  Even since this happened he has been walking on  the treadmill, walking regularly, playing golf without any difficulty.  He is scheduled to see the neurologist very soon.   PHYSICAL EXAMINATION:  VITAL SIGNS:  Today blood pressure 150/80, pulse  52.  LUNGS:  Lung fields are clear.  CARDIAC:  Rhythm is regular.  There is not a definite murmur.   Electrocardiogram demonstrates sinus bradycardia with right bundle  branch block.   IMPRESSION:  1. Coronary artery disease with prior multivessel percutaneous      coronary intervention.  2. Hypertension on medical therapy.  3. Paroxysmal atrial  fibrillation on Coumadin anticoagulation.  4. Hyperlipidemia on medical therapy.   PLAN:  1. Return to clinic in six months.  2. Continue current medical regimen.  3. Encourage followup with neurology as has been planned.     Arturo Morton. Riley Kill, MD, [facility]  Electronically Signed    TDS/MedQ  DD: 05/17/2006  DT: 05/17/2006  Job #: [id number]   cc:   Quita Skye. Artis Flock, M.D.

## 2010-07-04 ENCOUNTER — Ambulatory Visit (INDEPENDENT_AMBULATORY_CARE_PROVIDER_SITE_OTHER): Payer: Medicare Other | Admitting: *Deleted

## 2010-07-04 DIAGNOSIS — I4891 Unspecified atrial fibrillation: Secondary | ICD-10-CM

## 2010-07-04 LAB — POCT INR: INR: 2.7

## 2010-07-05 DIAGNOSIS — M1990 Unspecified osteoarthritis, unspecified site: Secondary | ICD-10-CM | POA: Insufficient documentation

## 2010-07-07 ENCOUNTER — Encounter: Payer: Self-pay | Admitting: Cardiology

## 2010-07-18 ENCOUNTER — Encounter: Payer: Self-pay | Admitting: Cardiology

## 2010-08-01 ENCOUNTER — Ambulatory Visit (INDEPENDENT_AMBULATORY_CARE_PROVIDER_SITE_OTHER): Payer: Medicare Other | Admitting: *Deleted

## 2010-08-01 DIAGNOSIS — I4891 Unspecified atrial fibrillation: Secondary | ICD-10-CM

## 2010-08-01 LAB — POCT INR: INR: 1.7

## 2010-08-10 ENCOUNTER — Other Ambulatory Visit: Payer: Self-pay | Admitting: *Deleted

## 2010-08-10 MED ORDER — COUMADIN 5 MG PO TABS: ORAL_TABLET | ORAL | Status: DC

## 2010-08-10 MED ORDER — EZETIMIBE 10 MG PO TABS: 10.0000 mg | ORAL_TABLET | Freq: Every day | ORAL | Status: DC

## 2010-08-22 ENCOUNTER — Ambulatory Visit (INDEPENDENT_AMBULATORY_CARE_PROVIDER_SITE_OTHER): Payer: Medicare Other | Admitting: *Deleted

## 2010-08-22 ENCOUNTER — Other Ambulatory Visit: Payer: Self-pay | Admitting: Cardiology

## 2010-08-22 DIAGNOSIS — I4891 Unspecified atrial fibrillation: Secondary | ICD-10-CM

## 2010-08-22 LAB — POCT INR: INR: 1.8

## 2010-08-24 ENCOUNTER — Other Ambulatory Visit: Payer: Self-pay | Admitting: Cardiology

## 2010-09-05 ENCOUNTER — Ambulatory Visit (INDEPENDENT_AMBULATORY_CARE_PROVIDER_SITE_OTHER): Payer: Medicare Other | Admitting: *Deleted

## 2010-09-05 DIAGNOSIS — I4891 Unspecified atrial fibrillation: Secondary | ICD-10-CM

## 2010-09-05 LAB — POCT INR: INR: 1.9

## 2010-09-06 ENCOUNTER — Encounter: Payer: Self-pay | Admitting: Cardiology

## 2010-09-23 ENCOUNTER — Encounter: Payer: Medicare Other | Admitting: *Deleted

## 2010-09-29 ENCOUNTER — Ambulatory Visit (INDEPENDENT_AMBULATORY_CARE_PROVIDER_SITE_OTHER): Payer: Medicare Other | Admitting: *Deleted

## 2010-09-29 DIAGNOSIS — I4891 Unspecified atrial fibrillation: Secondary | ICD-10-CM

## 2010-09-29 LAB — POCT INR: INR: 2.5

## 2010-10-27 ENCOUNTER — Ambulatory Visit (INDEPENDENT_AMBULATORY_CARE_PROVIDER_SITE_OTHER): Payer: Medicare Other | Admitting: *Deleted

## 2010-10-27 DIAGNOSIS — I4891 Unspecified atrial fibrillation: Secondary | ICD-10-CM

## 2010-10-27 LAB — POCT INR: INR: 2.2

## 2010-11-22 ENCOUNTER — Ambulatory Visit (INDEPENDENT_AMBULATORY_CARE_PROVIDER_SITE_OTHER): Payer: Medicare Other | Admitting: *Deleted

## 2010-11-22 DIAGNOSIS — I4891 Unspecified atrial fibrillation: Secondary | ICD-10-CM

## 2010-11-22 LAB — POCT INR: INR: 2.4

## 2010-11-24 ENCOUNTER — Encounter: Payer: Medicare Other | Admitting: *Deleted

## 2010-12-13 ENCOUNTER — Ambulatory Visit (INDEPENDENT_AMBULATORY_CARE_PROVIDER_SITE_OTHER): Payer: Medicare Other | Admitting: Cardiology

## 2010-12-13 ENCOUNTER — Encounter: Payer: Self-pay | Admitting: Cardiology

## 2010-12-13 DIAGNOSIS — I4891 Unspecified atrial fibrillation: Secondary | ICD-10-CM

## 2010-12-13 DIAGNOSIS — I350 Nonrheumatic aortic (valve) stenosis: Secondary | ICD-10-CM

## 2010-12-13 DIAGNOSIS — I10 Essential (primary) hypertension: Secondary | ICD-10-CM

## 2010-12-13 DIAGNOSIS — E7800 Pure hypercholesterolemia, unspecified: Secondary | ICD-10-CM

## 2010-12-13 DIAGNOSIS — I359 Nonrheumatic aortic valve disorder, unspecified: Secondary | ICD-10-CM

## 2010-12-13 DIAGNOSIS — I2510 Atherosclerotic heart disease of native coronary artery without angina pectoris: Secondary | ICD-10-CM

## 2010-12-13 MED ORDER — VERAPAMIL HCL 240 MG PO CP24: 240.0000 mg | ORAL_CAPSULE | Freq: Every day | ORAL | Status: DC

## 2010-12-13 MED ORDER — ATORVASTATIN CALCIUM 20 MG PO TABS: 20.0000 mg | ORAL_TABLET | Freq: Every day | ORAL | Status: DC

## 2010-12-13 MED ORDER — COUMADIN 5 MG PO TABS: ORAL_TABLET | ORAL | Status: DC

## 2010-12-13 NOTE — Patient Instructions (Signed)
Your physician has requested that you have an echocardiogram. Echocardiography is a painless test that uses sound waves to create images of your heart. It provides your doctor with information about the size and shape of your heart and how well your heart's chambers and valves are working. This procedure takes approximately one hour. There are no restrictions for this procedure.  Your physician has requested that you have a lower extremity arterial duplex. This test is an ultrasound of the arteries in the legs. It looks at arterial blood flow in the legs. Allow one hour for Lower  Arterial scans. There are no restrictions or special instructions  Your physician has requested that you have an ankle brachial index (ABI). During this test an ultrasound and blood pressure cuff are used to evaluate the arteries that supply the arms and legs with blood. Allow thirty minutes for this exam. There are no restrictions or special instructions.  Your physician wants you to follow-up in: 6 MONTHS. You will receive a reminder letter in the mail two months in advance. If you don't receive a letter, please call our office to schedule the follow-up appointment.  Your physician recommends that you continue on your current medications as directed. Please refer to the Current Medication list given to you today.

## 2010-12-16 ENCOUNTER — Telehealth: Payer: Self-pay | Admitting: Physician Assistant

## 2010-12-16 NOTE — Telephone Encounter (Signed)
Tyler Williams has been dizzy and has irreg HR today. Dizzyness bothers him, HR down to 45 @ times and BP up to 170s.  Pt has not taken his evening dose of Altace yet. He was advised to come to the ER but does not wish to do this. Advised him to come in by EMS if he is in danger of passing out or falling. O/W ofc to call Monday for f/u.

## 2010-12-18 NOTE — Telephone Encounter (Signed)
Bjorn Loser, I believe this is a Advice worker. Patient, I must have been copied by accident.  Will let [facility]. followup with patient.

## 2010-12-18 NOTE — Telephone Encounter (Signed)
We do not use the pools since we are small office, please route these messages to [employer] and [employer].  Thanks for checking on this process.  We will follow up tomorrow with Tyler Williams.

## 2010-12-19 NOTE — Progress Notes (Signed)
HPI:  Mr. Tyler Williams is in for follow up.  He is doing well.  Denies any chest pain.  Playing a lot of golf, and not having really any problems.  He maintains his activity without chest pain, shortness of breath, or any major complaints.  Perhaps his biggest complaint is that of knee problems, and he would like to have another knee surgery.  He and I reviewed this today in terms of the merits and the risks.  While he is not at enormous risk, he certainly has long standing known CAD now spanning more than a quarter of a century, and while stable, is still subject under periods of stress to trouble.    Current Outpatient Prescriptions  Medication Sig Dispense Refill  . ALTACE 10 MG capsule TAKE (1) CAPSULE TWICE   DAILY.  60 each  6  . atorvastatin (LIPITOR) 20 MG tablet Take 1 tablet (20 mg total) by mouth daily.  90 tablet  3  . Cholecalciferol (VITAMIN D-3) 5000 UNITS TABS Take 5,000 Units by mouth daily.        Marland Kitchen COUMADIN 5 MG tablet Take as directed by Anticoagulation clinic   30 each  1  . ezetimibe (ZETIA) 10 MG tablet Take 1 tablet (10 mg total) by mouth daily.  30 tablet  6  . finasteride (PROSCAR) 5 MG tablet Take 5 mg by mouth daily.        . hydrochlorothiazide (,MICROZIDE/HYDRODIURIL,) 12.5 MG capsule Take 12.5 mg by mouth daily.        . Ibuprofen (MOTRIN PO) Take by mouth as needed.        Marland Kitchen LORazepam (ATIVAN) 1 MG tablet Take 1 mg by mouth daily.       . Multiple Vitamins-Minerals (PRESERVISION/LUTEIN PO) Take by mouth 2 (two) times daily.        . Omega-3 Fatty Acids (FISH OIL) 300 MG CAPS Take 300 mg by mouth 2 (two) times daily.        . Psyllium (METAMUCIL) 30.9 % POWD Take by mouth as needed.        . verapamil (VERELAN PM) 240 MG 24 hr capsule Take 1 capsule (240 mg total) by mouth daily.  90 capsule  3    No Known Allergies  Past Medical History  Diagnosis Date  . CAD (coronary artery disease)     with multiple percutaneous prior coronary  artery interventions  . PAF  (paroxysmal atrial fibrillation)   . PVD (peripheral vascular disease)   . Hypercholesteremia   . SSS (sick sinus syndrome)   . Edema     Past Surgical History  Procedure Date  . Angioplasty   . Orthopedic surgery     No family history on file.  History   Social History  . Marital Status: Married    Spouse Name: N/A    Number of Children: N/A  . Years of Education: N/A   Occupational History  . Not on file.   Social History Main Topics  . Smoking status: Former Smoker    Types: Cigarettes    Quit date: 02/14/1984  . Smokeless tobacco: Not on file  . Alcohol Use: No  . Drug Use: Yes  . Sexually Active: Not on file   Other Topics Concern  . Not on file   Social History Narrative  . No narrative on file    ROS: Please see the HPI.  All other systems reviewed and negative.  PHYSICAL EXAM:  BP 154/80  Pulse  78  Resp 18  Ht 5\' 10"  (1.778 m)  Wt 202 lb 6.4 oz (91.808 kg)  BMI 29.04 kg/m2  General: Well developed, well nourished, in no acute distress. Head:  Normocephalic and atraumatic. Neck: no JVD Lungs: Clear to auscultation and percussion. Heart: Irregularly, irregular rhythm, with contolled rate.   PMI non displaced.  Soft SEM. NoDM.  Abdomen:  Normal bowel sounds; soft; non tender; no organomegaly.  Incision healed.  Pulses: Pulses normal in all 4 extremities. Extremities: No clubbing or cyanosis. No edema.  Excellent pulses in the R foot.  Neurologic: Alert and oriented x 3.  EKG:  Atrial fibrillation.  Controlled ventricular response with rate of about 82 bpm.  g  ASSESSMENT AND PLAN:

## 2010-12-19 NOTE — Telephone Encounter (Signed)
Called patient to follow up from complaints from Friday evening. He states that he is currently feeling fine without any problems. He will call back if he has any issues.

## 2010-12-20 ENCOUNTER — Ambulatory Visit (INDEPENDENT_AMBULATORY_CARE_PROVIDER_SITE_OTHER): Payer: Medicare Other | Admitting: *Deleted

## 2010-12-20 DIAGNOSIS — I4891 Unspecified atrial fibrillation: Secondary | ICD-10-CM

## 2010-12-20 LAB — POCT INR: INR: 2.1

## 2011-01-01 DIAGNOSIS — I350 Nonrheumatic aortic (valve) stenosis: Secondary | ICD-10-CM | POA: Insufficient documentation

## 2011-01-01 NOTE — Assessment & Plan Note (Signed)
Minimal SEM, and mild findings on last echo a couple of years ago.  Therefore will repeat.

## 2011-01-01 NOTE — Assessment & Plan Note (Signed)
On multi drug treatment with reasonable control.

## 2011-01-01 NOTE — Assessment & Plan Note (Signed)
Assuming checked by his primary MD.  Currently on multi drug therapy.

## 2011-01-01 NOTE — Assessment & Plan Note (Signed)
Controlled at the present time.  Rate is adequate.  He remains on warfarin without difficulty.

## 2011-01-01 NOTE — Assessment & Plan Note (Signed)
Absolutely no recurrent symptoms.  Given his age of nearly 70, we are managing conservatively in the absence of new symptoms.

## 2011-01-02 ENCOUNTER — Encounter (INDEPENDENT_AMBULATORY_CARE_PROVIDER_SITE_OTHER): Payer: Medicare Other | Admitting: Cardiology

## 2011-01-02 ENCOUNTER — Ambulatory Visit (HOSPITAL_COMMUNITY): Payer: Medicare Other | Attending: Cardiology | Admitting: Radiology

## 2011-01-02 DIAGNOSIS — I4891 Unspecified atrial fibrillation: Secondary | ICD-10-CM

## 2011-01-02 DIAGNOSIS — I079 Rheumatic tricuspid valve disease, unspecified: Secondary | ICD-10-CM | POA: Insufficient documentation

## 2011-01-02 DIAGNOSIS — E7800 Pure hypercholesterolemia, unspecified: Secondary | ICD-10-CM

## 2011-01-02 DIAGNOSIS — E785 Hyperlipidemia, unspecified: Secondary | ICD-10-CM | POA: Insufficient documentation

## 2011-01-02 DIAGNOSIS — I2510 Atherosclerotic heart disease of native coronary artery without angina pectoris: Secondary | ICD-10-CM

## 2011-01-02 DIAGNOSIS — I739 Peripheral vascular disease, unspecified: Secondary | ICD-10-CM

## 2011-01-02 DIAGNOSIS — I10 Essential (primary) hypertension: Secondary | ICD-10-CM | POA: Insufficient documentation

## 2011-01-23 ENCOUNTER — Other Ambulatory Visit: Payer: Self-pay | Admitting: Cardiology

## 2011-01-24 ENCOUNTER — Other Ambulatory Visit: Payer: Self-pay

## 2011-01-24 MED ORDER — RAMIPRIL 10 MG PO CAPS: 10.0000 mg | ORAL_CAPSULE | Freq: Every day | ORAL | Status: DC

## 2011-01-24 NOTE — Telephone Encounter (Signed)
.   Requested Prescriptions   Signed Prescriptions Disp Refills  . ramipril (ALTACE) 10 MG capsule 60 capsule 6    Sig: Take 1 capsule (10 mg total) by mouth daily.    Authorizing Provider: Shawnie Pons    Ordering User: Lacie Scotts   E-scribe to [employer].

## 2011-01-31 ENCOUNTER — Ambulatory Visit (INDEPENDENT_AMBULATORY_CARE_PROVIDER_SITE_OTHER): Payer: Medicare Other | Admitting: *Deleted

## 2011-01-31 DIAGNOSIS — I4891 Unspecified atrial fibrillation: Secondary | ICD-10-CM

## 2011-01-31 LAB — POCT INR: INR: 3

## 2011-02-15 ENCOUNTER — Other Ambulatory Visit: Payer: Self-pay | Admitting: *Deleted

## 2011-02-15 DIAGNOSIS — E7800 Pure hypercholesterolemia, unspecified: Secondary | ICD-10-CM

## 2011-02-15 DIAGNOSIS — I4891 Unspecified atrial fibrillation: Secondary | ICD-10-CM

## 2011-02-15 DIAGNOSIS — I2510 Atherosclerotic heart disease of native coronary artery without angina pectoris: Secondary | ICD-10-CM

## 2011-02-15 MED ORDER — COUMADIN 5 MG PO TABS: ORAL_TABLET | ORAL | Status: DC

## 2011-02-16 DIAGNOSIS — H353190 Nonexudative age-related macular degeneration, unspecified eye, stage unspecified: Secondary | ICD-10-CM | POA: Diagnosis not present

## 2011-02-16 DIAGNOSIS — H353290 Exudative age-related macular degeneration, unspecified eye, stage unspecified: Secondary | ICD-10-CM | POA: Diagnosis not present

## 2011-02-16 DIAGNOSIS — Z961 Presence of intraocular lens: Secondary | ICD-10-CM | POA: Insufficient documentation

## 2011-02-17 ENCOUNTER — Other Ambulatory Visit: Payer: Self-pay

## 2011-02-17 DIAGNOSIS — E7800 Pure hypercholesterolemia, unspecified: Secondary | ICD-10-CM

## 2011-02-17 DIAGNOSIS — I2510 Atherosclerotic heart disease of native coronary artery without angina pectoris: Secondary | ICD-10-CM

## 2011-02-17 DIAGNOSIS — I4891 Unspecified atrial fibrillation: Secondary | ICD-10-CM

## 2011-02-17 MED ORDER — COUMADIN 5 MG PO TABS: ORAL_TABLET | ORAL | Status: DC

## 2011-02-21 ENCOUNTER — Other Ambulatory Visit: Payer: Self-pay | Admitting: Cardiology

## 2011-02-21 NOTE — Telephone Encounter (Signed)
FU Call: Pharmacy calling regarding coumadin 5 mg refill. Please call this in ASAP.

## 2011-03-14 ENCOUNTER — Ambulatory Visit (INDEPENDENT_AMBULATORY_CARE_PROVIDER_SITE_OTHER): Payer: Medicare Other | Admitting: Pharmacist

## 2011-03-14 DIAGNOSIS — I4891 Unspecified atrial fibrillation: Secondary | ICD-10-CM

## 2011-03-14 LAB — POCT INR: INR: 2.5

## 2011-03-15 ENCOUNTER — Other Ambulatory Visit: Payer: Self-pay | Admitting: Cardiology

## 2011-04-18 DIAGNOSIS — I2510 Atherosclerotic heart disease of native coronary artery without angina pectoris: Secondary | ICD-10-CM | POA: Diagnosis not present

## 2011-04-18 DIAGNOSIS — I4891 Unspecified atrial fibrillation: Secondary | ICD-10-CM | POA: Diagnosis not present

## 2011-04-18 DIAGNOSIS — E785 Hyperlipidemia, unspecified: Secondary | ICD-10-CM | POA: Diagnosis not present

## 2011-04-18 DIAGNOSIS — I10 Essential (primary) hypertension: Secondary | ICD-10-CM | POA: Diagnosis not present

## 2011-04-20 DIAGNOSIS — Z961 Presence of intraocular lens: Secondary | ICD-10-CM | POA: Diagnosis not present

## 2011-04-20 DIAGNOSIS — H353290 Exudative age-related macular degeneration, unspecified eye, stage unspecified: Secondary | ICD-10-CM | POA: Diagnosis not present

## 2011-04-20 DIAGNOSIS — H353190 Nonexudative age-related macular degeneration, unspecified eye, stage unspecified: Secondary | ICD-10-CM | POA: Diagnosis not present

## 2011-04-21 ENCOUNTER — Ambulatory Visit (INDEPENDENT_AMBULATORY_CARE_PROVIDER_SITE_OTHER): Payer: Medicare Other

## 2011-04-21 DIAGNOSIS — I4891 Unspecified atrial fibrillation: Secondary | ICD-10-CM

## 2011-04-21 LAB — POCT INR: INR: 2.7

## 2011-05-03 DIAGNOSIS — M1710 Unilateral primary osteoarthritis, unspecified knee: Secondary | ICD-10-CM | POA: Diagnosis not present

## 2011-05-03 DIAGNOSIS — IMO0002 Reserved for concepts with insufficient information to code with codable children: Secondary | ICD-10-CM | POA: Diagnosis not present

## 2011-05-03 DIAGNOSIS — M25579 Pain in unspecified ankle and joints of unspecified foot: Secondary | ICD-10-CM | POA: Diagnosis not present

## 2011-05-08 ENCOUNTER — Encounter (HOSPITAL_COMMUNITY): Payer: Self-pay

## 2011-05-09 ENCOUNTER — Encounter (HOSPITAL_COMMUNITY)
Admission: RE | Admit: 2011-05-09 | Discharge: 2011-05-09 | Disposition: A | Discharge status: Home / Self Care | Payer: Medicare Other | Source: Ambulatory Visit | Attending: Orthopedic Surgery | Admitting: Orthopedic Surgery

## 2011-05-09 ENCOUNTER — Encounter (HOSPITAL_COMMUNITY): Payer: Self-pay

## 2011-05-09 ENCOUNTER — Ambulatory Visit (HOSPITAL_COMMUNITY)
Admission: RE | Admit: 2011-05-09 | Discharge: 2011-05-09 | Disposition: A | Discharge status: Home / Self Care | Payer: Medicare Other | Source: Ambulatory Visit | Attending: Orthopedic Surgery | Admitting: Orthopedic Surgery

## 2011-05-09 DIAGNOSIS — M47814 Spondylosis without myelopathy or radiculopathy, thoracic region: Secondary | ICD-10-CM | POA: Insufficient documentation

## 2011-05-09 DIAGNOSIS — I10 Essential (primary) hypertension: Secondary | ICD-10-CM | POA: Diagnosis not present

## 2011-05-09 DIAGNOSIS — Z01818 Encounter for other preprocedural examination: Secondary | ICD-10-CM | POA: Diagnosis not present

## 2011-05-09 DIAGNOSIS — Z01812 Encounter for preprocedural laboratory examination: Secondary | ICD-10-CM | POA: Insufficient documentation

## 2011-05-09 DIAGNOSIS — I4891 Unspecified atrial fibrillation: Secondary | ICD-10-CM | POA: Diagnosis not present

## 2011-05-09 HISTORY — DX: Unspecified osteoarthritis, unspecified site: M19.90

## 2011-05-09 HISTORY — DX: Malignant (primary) neoplasm, unspecified: C80.1

## 2011-05-09 HISTORY — DX: Gastro-esophageal reflux disease without esophagitis: K21.9

## 2011-05-09 HISTORY — DX: Essential (primary) hypertension: I10

## 2011-05-09 LAB — PROTIME-INR
INR: 1.9 — ABNORMAL HIGH (ref 0.00–1.49)
Prothrombin Time: 22.1 seconds — ABNORMAL HIGH (ref 11.6–15.2)

## 2011-05-09 LAB — CBC
HCT: 45.4 % (ref 39.0–52.0)
Hemoglobin: 15.2 g/dL (ref 13.0–17.0)
MCH: 31.5 pg (ref 26.0–34.0)
MCHC: 33.5 g/dL (ref 30.0–36.0)
MCV: 94.2 fL (ref 78.0–100.0)
Platelets: 147 10*3/uL — ABNORMAL LOW (ref 150–400)
RBC: 4.82 MIL/uL (ref 4.22–5.81)
RDW: 14.3 % (ref 11.5–15.5)
WBC: 7.4 10*3/uL (ref 4.0–10.5)

## 2011-05-09 LAB — BASIC METABOLIC PANEL
BUN: 24 mg/dL — ABNORMAL HIGH (ref 6–23)
CO2: 28 mEq/L (ref 19–32)
Calcium: 9.6 mg/dL (ref 8.4–10.5)
Chloride: 100 mEq/L (ref 96–112)
Creatinine, Ser: 0.9 mg/dL (ref 0.50–1.35)
GFR calc Af Amer: 89 mL/min — ABNORMAL LOW (ref >90)
GFR calc non Af Amer: 77 mL/min — ABNORMAL LOW (ref >90)
Glucose, Bld: 85 mg/dL (ref 70–99)
Potassium: 4.7 mEq/L (ref 3.5–5.1)
Sodium: 136 mEq/L (ref 135–145)

## 2011-05-09 LAB — URINALYSIS, ROUTINE W REFLEX MICROSCOPIC
Bilirubin Urine: NEGATIVE
Glucose, UA: NEGATIVE mg/dL
Hgb urine dipstick: NEGATIVE
Ketones, ur: NEGATIVE mg/dL
Leukocytes, UA: NEGATIVE
Nitrite: NEGATIVE
Protein, ur: NEGATIVE mg/dL
Specific Gravity, Urine: 1.019 (ref 1.005–1.030)
Urobilinogen, UA: 0.2 mg/dL (ref 0.0–1.0)
pH: 7 (ref 5.0–8.0)

## 2011-05-09 LAB — DIFFERENTIAL
Basophils Absolute: 0 10*3/uL (ref 0.0–0.1)
Basophils Relative: 1 % (ref 0–1)
Eosinophils Absolute: 0.1 10*3/uL (ref 0.0–0.7)
Eosinophils Relative: 1 % (ref 0–5)
Lymphocytes Relative: 24 % (ref 12–46)
Lymphs Abs: 1.8 10*3/uL (ref 0.7–4.0)
Monocytes Absolute: 0.7 10*3/uL (ref 0.1–1.0)
Monocytes Relative: 10 % (ref 3–12)
Neutro Abs: 4.8 10*3/uL (ref 1.7–7.7)
Neutrophils Relative %: 65 % (ref 43–77)

## 2011-05-09 LAB — SURGICAL PCR SCREEN
MRSA, PCR: NEGATIVE
Staphylococcus aureus: NEGATIVE

## 2011-05-09 LAB — APTT: aPTT: 35 seconds (ref 24–37)

## 2011-05-09 MED ORDER — CEFAZOLIN SODIUM 1-5 GM-% IV SOLN: 1.0000 g | INTRAVENOUS | Status: DC

## 2011-05-09 NOTE — Patient Instructions (Signed)
22 Tyler Williams  05/09/2011   Your procedure is scheduled on:  05/22/11 0730am-0900am  Report to [facility] at 0530 AM.  Call this number if you have problems the morning of surgery: [phone]   Remember:   Do not eat food:After Midnight.  May have clear liquids:until Midnight .    Take these medicines the morning of surgery with A SIP OF WATER:    Do not wear jewelry, .  Do not wear lotions, powders, or perfumes.   .  Do not bring valuables to the hospital.  Contacts, dentures or bridgework may not be worn into surgery.  Leave suitcase in the car. After surgery it may be brought to your room.  For patients admitted to the hospital, checkout time is 11:00 AM the day of discharge.      Special Instructions: CHG Shower Use Special Wash: 1/2 bottle night before surgery and 1/2 bottle morning of surgery. shower chin to toes with CHG.  Wash face and private parts with regular soapp.     Please read over the following fact sheets that you were given: MRSA Information, Blood Transfusion Fact Sheet, Incentive Spirometry Fact Sheet, coughing and deep breathing exercises, leg exercises

## 2011-05-09 NOTE — Pre-Procedure Instructions (Signed)
05/09/11 [name], PA made aware BUN 24 on preop labs today.

## 2011-05-09 NOTE — Pre-Procedure Instructions (Signed)
LOV with cardiologist( Dr Riley Kill ) 12/13/10 EP[facility]  ECHO 01/02/11  [facility]  Pt aware to stop coumadin 5 days prior to surgery

## 2011-05-17 ENCOUNTER — Telehealth: Payer: Self-pay | Admitting: Cardiology

## 2011-05-17 NOTE — Telephone Encounter (Signed)
The pt is currently out of town on a golf trip per pt's wife.  She will have the pt call our office on Thursday when he gets back in town.  Per the pt's wife she said the pt's surgery will probably have to be postponed.  The pt needs clearance from Dr Randa Evens at [facility] and the pt refuses to follow-up with MD.  I left a message for Tyler Williams to call the office.

## 2011-05-17 NOTE — Telephone Encounter (Signed)
New problem:  Per Clydie Braun checking on status of cardiac clearance- surgery on Monday.

## 2011-05-18 NOTE — Telephone Encounter (Signed)
Dr Riley Kill spoke with the pt and cleared him for surgery.  Clearance note faxed to Clydie Braun.

## 2011-05-18 NOTE — Telephone Encounter (Signed)
I spoke with the pt and he is planning on having surgery on 05/22/11.  I will have Dr Riley Kill review cardiac clearance request.

## 2011-05-18 NOTE — Telephone Encounter (Signed)
Fu call °Pt returning your call  °

## 2011-05-19 NOTE — H&P (Signed)
Tyler Williams is an 78 y.o. male.    Chief Complaint: right knee medial aspect OA and pain   HPI: Pt is a 78 y.o. male complaining of right knee pain for 3-4 years. Pain had continually increased since the beginning. X-rays in the clinic show end-stage arthritic changes of the medial aspect of the right knee. Pt has tried various conservative treatments which have failed to alleviate their symptoms. Various options are discussed with the patient. Risks, benefits and expectations were discussed with the patient. Patient understand the risks, benefits and expectations and wishes to proceed with surgery.   PCP:  Minda Meo, MD, MD  D/C Plans:  Home with HHPT  Post-op Meds:  Rx given for Robaxin, Iron, Colace and MiraLax. Will go back on Coumadin with bridging Lovenox.  Tranexamic Acid:   Not to be given - vascular disease  Decadron:   To be given  PMH: Past Medical History  Diagnosis Date  . CAD (coronary artery disease)     with multiple percutaneous prior coronary  artery interventions  . PAF (paroxysmal atrial fibrillation)   . PVD (peripheral vascular disease)   . Hypercholesteremia   . SSS (sick sinus syndrome)   . Edema   . Hypertension   . GERD (gastroesophageal reflux disease)   . Cancer     hx of skin cancer   . Arthritis     knees   . Pulmonary embolism     1964 related to dislocated hip on right     PSH: Past Surgical History  Procedure Date  . Angioplasty   . Orthopedic surgery   . Coronary angioplasty   . Hernia repair     right inguinal hernia repair   . Abdominal aortic aneurysm repair     1993   . Other surgical history     right knee popliteal aneurysm surgery stent placed   . Tonsillectomy   . Other surgical history   . Hemorrhoid surgery     1973  . Eye surgery     hx of cataract surgery     Social History:  reports that he quit smoking about 27 years ago. His smoking use included Cigarettes. He has never used smokeless tobacco. He  reports that he drinks about 4.2 ounces of alcohol per week. He reports that he does not use illicit drugs.  Allergies:  No Known Allergies  Medications: No current facility-administered medications for this encounter.   Current Outpatient Prescriptions  Medication Sig Dispense Refill  . atorvastatin (LIPITOR) 20 MG tablet Take 1 tablet (20 mg total) by mouth daily.  90 tablet  3  . Cholecalciferol (VITAMIN D-3) 5000 UNITS TABS Take 2,000 Units by mouth daily.       . Coenzyme Q10 (CO Q 10) 100 MG CAPS Take 1 capsule by mouth 2 (two) times daily.      . finasteride (PROSCAR) 5 MG tablet Take 5 mg by mouth daily.       . hydrochlorothiazide (,MICROZIDE/HYDRODIURIL,) 12.5 MG capsule Take 12.5 mg by mouth daily.       . Ibuprofen (MOTRIN PO) Take 200 mg by mouth as needed. pain      . LORazepam (ATIVAN) 1 MG tablet Take 1 mg by mouth daily.       . Multiple Vitamins-Minerals (PRESERVISION/LUTEIN PO) Take 1 tablet by mouth 2 (two) times daily.       . Omega-3 Fatty Acids (FISH OIL) 300 MG CAPS Take 300 mg by mouth 2 (  two) times daily.       . Psyllium (METAMUCIL) 30.9 % POWD Take by mouth as needed.       . ramipril (ALTACE) 10 MG capsule Take 10 mg by mouth 2 (two) times daily.      . Ranibizumab (LUCENTIS) 0.5 MG/0.05ML SOLN Inject 0.5 mg into the eye See admin instructions. Pt gets this injection every 9 weeks. Next treatment is due on 06-22-11. Pt is followed by [facility].      . verapamil (VERELAN PM) 240 MG 24 hr capsule Take 1 capsule (240 mg total) by mouth daily.  90 capsule  3  . warfarin (COUMADIN) 5 MG tablet 5 mg daily except on Monday 7.5 mg on Monday      . ZETIA 10 MG tablet TAKE 1 TABLET EACH DAY.  30 each  11    ROS: Review of Systems  Constitutional: Negative.  Negative for fever, chills and malaise/fatigue.  HENT: Positive for tinnitus.   Eyes: Negative.   Respiratory: Negative.   Cardiovascular: Negative.   Gastrointestinal: Positive for constipation.    Genitourinary: Positive for urgency and frequency.  Musculoskeletal: Positive for back pain and joint pain.  Skin: Negative.   Neurological: Negative.   Endo/Heme/Allergies: Negative.   Psychiatric/Behavioral: Negative.      Physical Exam: BP: 157/77 ; HR: 81 ; Resp: 18 ; Physical Exam  Constitutional: He is oriented to person, place, and time and well-developed, well-nourished, and in no distress.  HENT:  Head: Normocephalic and atraumatic.  Nose: Nose normal.  Mouth/Throat: Oropharynx is clear and moist.  Eyes: Pupils are equal, round, and reactive to light.  Neck: Neck supple. No JVD present. No tracheal deviation present. No thyromegaly present.  Cardiovascular: Normal rate, regular rhythm, normal heart sounds and intact distal pulses.   Pulmonary/Chest: Effort normal and breath sounds normal. No stridor.  Abdominal: Soft. There is no tenderness. There is no guarding.  Musculoskeletal:       Right knee: He exhibits swelling and bony tenderness. He exhibits normal range of motion, no effusion, no ecchymosis, no laceration and no erythema. tenderness found. Medial joint line tenderness noted.  Lymphadenopathy:    He has no cervical adenopathy.  Neurological: He is alert and oriented to person, place, and time.  Skin: Skin is warm and dry.  Psychiatric: Affect normal.      Assessment/Plan Assessment: right knee medial aspect OA and pain   Plan: Patient will undergo a right medial unilateral knee replacement on 05/22/2011. Risks benefits and expectation were discussed with the patient. Patient understand risks, benefits and expectation and wishes to proceed.   [name] [name]   PAC  05/19/2011, 7:06 PM

## 2011-05-21 MED ORDER — CEFAZOLIN SODIUM-DEXTROSE 2-3 GM-% IV SOLR: 2.0000 g | Freq: Once | INTRAVENOUS | Status: DC

## 2011-05-22 ENCOUNTER — Ambulatory Visit (HOSPITAL_COMMUNITY): Admission: RE | Admit: 2011-05-22 | Payer: Medicare Other | Source: Ambulatory Visit | Admitting: Orthopedic Surgery

## 2011-05-22 ENCOUNTER — Encounter (HOSPITAL_COMMUNITY): Admission: RE | Payer: Self-pay | Source: Ambulatory Visit

## 2011-05-22 SURGERY — ARTHROPLASTY, KNEE, UNICOMPARTMENTAL
Anesthesia: Spinal | Site: Knee | Laterality: Right

## 2011-06-02 ENCOUNTER — Ambulatory Visit (INDEPENDENT_AMBULATORY_CARE_PROVIDER_SITE_OTHER): Payer: Medicare Other | Admitting: *Deleted

## 2011-06-02 DIAGNOSIS — I4891 Unspecified atrial fibrillation: Secondary | ICD-10-CM

## 2011-06-02 LAB — POCT INR: INR: 1.7

## 2011-06-13 ENCOUNTER — Encounter: Payer: Self-pay | Admitting: Cardiology

## 2011-06-13 ENCOUNTER — Ambulatory Visit (INDEPENDENT_AMBULATORY_CARE_PROVIDER_SITE_OTHER): Payer: Medicare Other

## 2011-06-13 ENCOUNTER — Ambulatory Visit (INDEPENDENT_AMBULATORY_CARE_PROVIDER_SITE_OTHER): Payer: Medicare Other | Admitting: Cardiology

## 2011-06-13 ENCOUNTER — Other Ambulatory Visit: Payer: Self-pay | Admitting: Cardiology

## 2011-06-13 VITALS — BP 160/82 | HR 75 | Wt 193.8 lb

## 2011-06-13 DIAGNOSIS — I2510 Atherosclerotic heart disease of native coronary artery without angina pectoris: Secondary | ICD-10-CM | POA: Diagnosis not present

## 2011-06-13 DIAGNOSIS — I359 Nonrheumatic aortic valve disorder, unspecified: Secondary | ICD-10-CM | POA: Diagnosis not present

## 2011-06-13 DIAGNOSIS — E7800 Pure hypercholesterolemia, unspecified: Secondary | ICD-10-CM | POA: Diagnosis not present

## 2011-06-13 DIAGNOSIS — I4891 Unspecified atrial fibrillation: Secondary | ICD-10-CM | POA: Diagnosis not present

## 2011-06-13 DIAGNOSIS — I350 Nonrheumatic aortic (valve) stenosis: Secondary | ICD-10-CM

## 2011-06-13 LAB — POCT INR: INR: 1.9

## 2011-06-13 MED ORDER — HYDROCHLOROTHIAZIDE 12.5 MG PO CAPS: 12.5000 mg | ORAL_CAPSULE | Freq: Every day | ORAL | Status: DC

## 2011-06-13 MED ORDER — EZETIMIBE 10 MG PO TABS: 10.0000 mg | ORAL_TABLET | Freq: Every day | ORAL | Status: DC

## 2011-06-13 NOTE — Assessment & Plan Note (Signed)
No angina. Continue medical therapy.  

## 2011-06-13 NOTE — Assessment & Plan Note (Signed)
Mild to moderate.  No consequences.  Repeat echo in one year.

## 2011-06-13 NOTE — Patient Instructions (Signed)
Your physician wants you to follow-up in: 6 MONTHS.  You will receive a reminder letter in the mail two months in advance. If you don't receive a letter, please call our office to schedule the follow-up appointment.  Your physician recommends that you continue on your current medications as directed. Please refer to the Current Medication list given to you today.  You can hold coumadin as instructed prior to your colonoscopy.  Your physician has requested that you regularly monitor and record your blood pressure readings at home. Please use the same machine at the same time of day to check your readings and record them to bring to your follow-up visit.

## 2011-06-13 NOTE — Assessment & Plan Note (Signed)
Rate is nicely controlled.  

## 2011-06-13 NOTE — Patient Instructions (Signed)
Resume Coumadin after colonoscopy per MD, take 1.5 tablets x 2 doses then resume same dosage 1 tablet daily except 1.5 tablets on Mondays.

## 2011-06-13 NOTE — Progress Notes (Signed)
HPI:  Doing well.  Decided not to have surgery.  No specific complaints.  May need endoscopy.  I reviewed that with the patient.  He can come of warfarin--understands risks with that.  We also discussed surgery.  Knee brace helps.  No major complaints.    Current Outpatient Prescriptions  Medication Sig Dispense Refill  . atorvastatin (LIPITOR) 20 MG tablet Take 1 tablet (20 mg total) by mouth daily.  90 tablet  3  . Cholecalciferol (VITAMIN D-3) 5000 UNITS TABS Take 2,000 Units by mouth daily.       . Coenzyme Q10 (CO Q 10) 100 MG CAPS Take 1 capsule by mouth 2 (two) times daily.      . finasteride (PROSCAR) 5 MG tablet Take 5 mg by mouth daily.       . Ibuprofen (MOTRIN PO) Take 200 mg by mouth as needed. pain      . LORazepam (ATIVAN) 1 MG tablet Take 1 mg by mouth daily.       . Multiple Vitamins-Minerals (PRESERVISION/LUTEIN PO) Take 1 tablet by mouth 2 (two) times daily.       . Omega-3 Fatty Acids (FISH OIL) 300 MG CAPS Take 300 mg by mouth 2 (two) times daily.       . Psyllium (METAMUCIL) 30.9 % POWD Take by mouth as needed.       . ramipril (ALTACE) 10 MG capsule Take 10 mg by mouth 2 (two) times daily.      . Ranibizumab (LUCENTIS) 0.5 MG/0.05ML SOLN Inject 0.5 mg into the eye See admin instructions. Pt gets this injection every 9 weeks. Next treatment is due on 06-22-11. Pt is followed by [facility].      . verapamil (VERELAN PM) 240 MG 24 hr capsule Take 1 capsule (240 mg total) by mouth daily.  90 capsule  3  . warfarin (COUMADIN) 5 MG tablet 5 mg daily except on Monday 7.5 mg on Monday      . ZETIA 10 MG tablet TAKE 1 TABLET EACH DAY.  30 each  11  . DISCONTD: hydrochlorothiazide (,MICROZIDE/HYDRODIURIL,) 12.5 MG capsule Take 12.5 mg by mouth daily.       . hydrochlorothiazide (MICROZIDE) 12.5 MG capsule TAKE (1) CAPSULE DAILY.  90 capsule  3    No Known Allergies  Past Medical History  Diagnosis Date  . CAD (coronary artery disease)     with multiple  percutaneous prior coronary  artery interventions  . PAF (paroxysmal atrial fibrillation)   . PVD (peripheral vascular disease)   . Hypercholesteremia   . SSS (sick sinus syndrome)   . Edema   . Hypertension   . GERD (gastroesophageal reflux disease)   . Cancer     hx of skin cancer   . Arthritis     knees   . Pulmonary embolism     1964 related to dislocated hip on right     Past Surgical History  Procedure Date  . Angioplasty   . Orthopedic surgery   . Coronary angioplasty   . Hernia repair     right inguinal hernia repair   . Abdominal aortic aneurysm repair     1993   . Other surgical history     right knee popliteal aneurysm surgery stent placed   . Tonsillectomy   . Other surgical history   . Hemorrhoid surgery     1973  . Eye surgery     hx of cataract surgery  No family history on file.  History   Social History  . Marital Status: Married    Spouse Name: N/A    Number of Children: N/A  . Years of Education: N/A   Occupational History  . Not on file.   Social History Main Topics  . Smoking status: Former Smoker    Types: Cigarettes    Quit date: 02/14/1984  . Smokeless tobacco: Never Used  . Alcohol Use: 4.2 oz/week    7 Glasses of wine per week     5 ounces of wine daily   . Drug Use: No  . Sexually Active: Not on file   Other Topics Concern  . Not on file   Social History Narrative  . No narrative on file    ROS: Please see the HPI.  All other systems reviewed and negative.  PHYSICAL EXAM:  BP 160/82  Pulse 75  Wt 193 lb 12 oz (87.884 kg)  General: Well developed, well nourished, in no acute distress. Head:  Normocephalic and atraumatic. Neck: no JVD Lungs: Clear to auscultation and percussion. Heart: irregularly irregular rhythm.  SEM  2/6.  No diastolic murmurs.    Abdomen:  Normal bowel sounds; soft; non tender; no organomegaly Pulses: Pulses normal in all 4 extremities. Extremities: No clubbing or cyanosis. No  edema. Neurologic: Alert and oriented x 3.  EKG:  Atrial fib controlled ventricular response.  RBBB.  Nonspecific ST and Tchanges.   ASSESSMENT AND PLAN:

## 2011-06-13 NOTE — Assessment & Plan Note (Signed)
Followed by primary care.   

## 2011-06-26 ENCOUNTER — Telehealth: Payer: Self-pay | Admitting: Cardiology

## 2011-06-26 NOTE — Telephone Encounter (Signed)
They need a letter of clearance to stop coumadin they have a verbal but not in writing

## 2011-06-26 NOTE — Telephone Encounter (Signed)
Spoke with Rene Kocher at [facility] office. Will fax a copy of Dr Rosalyn Charters 06/13/11 office note to Dr Kinnie Scales. This note  addresses warfarin prior to colonoscopy .

## 2011-06-28 ENCOUNTER — Encounter: Payer: Self-pay | Admitting: Gastroenterology

## 2011-06-28 ENCOUNTER — Ambulatory Visit (INDEPENDENT_AMBULATORY_CARE_PROVIDER_SITE_OTHER): Payer: Medicare Other | Admitting: *Deleted

## 2011-06-28 DIAGNOSIS — I4891 Unspecified atrial fibrillation: Secondary | ICD-10-CM

## 2011-06-28 LAB — POCT INR
INR: 1.2
INR: 1.2

## 2011-06-29 DIAGNOSIS — Z86010 Personal history of colonic polyps: Secondary | ICD-10-CM | POA: Diagnosis not present

## 2011-06-29 DIAGNOSIS — D126 Benign neoplasm of colon, unspecified: Secondary | ICD-10-CM | POA: Diagnosis not present

## 2011-06-29 DIAGNOSIS — K5730 Diverticulosis of large intestine without perforation or abscess without bleeding: Secondary | ICD-10-CM | POA: Diagnosis not present

## 2011-07-04 DIAGNOSIS — Z125 Encounter for screening for malignant neoplasm of prostate: Secondary | ICD-10-CM | POA: Diagnosis not present

## 2011-07-04 DIAGNOSIS — I10 Essential (primary) hypertension: Secondary | ICD-10-CM | POA: Diagnosis not present

## 2011-07-04 DIAGNOSIS — I4891 Unspecified atrial fibrillation: Secondary | ICD-10-CM | POA: Diagnosis not present

## 2011-07-04 DIAGNOSIS — E785 Hyperlipidemia, unspecified: Secondary | ICD-10-CM | POA: Diagnosis not present

## 2011-07-06 DIAGNOSIS — H353290 Exudative age-related macular degeneration, unspecified eye, stage unspecified: Secondary | ICD-10-CM | POA: Diagnosis not present

## 2011-07-06 DIAGNOSIS — Z961 Presence of intraocular lens: Secondary | ICD-10-CM | POA: Diagnosis not present

## 2011-07-06 DIAGNOSIS — H353190 Nonexudative age-related macular degeneration, unspecified eye, stage unspecified: Secondary | ICD-10-CM | POA: Diagnosis not present

## 2011-07-07 ENCOUNTER — Ambulatory Visit (INDEPENDENT_AMBULATORY_CARE_PROVIDER_SITE_OTHER): Payer: Medicare Other | Admitting: Pharmacist

## 2011-07-07 DIAGNOSIS — I4891 Unspecified atrial fibrillation: Secondary | ICD-10-CM

## 2011-07-07 LAB — POCT INR: INR: 2.4

## 2011-07-11 DIAGNOSIS — Z0000 Encounter for general adult medical examination without abnormal findings: Secondary | ICD-10-CM | POA: Diagnosis not present

## 2011-07-11 DIAGNOSIS — I10 Essential (primary) hypertension: Secondary | ICD-10-CM | POA: Diagnosis not present

## 2011-07-11 DIAGNOSIS — I2510 Atherosclerotic heart disease of native coronary artery without angina pectoris: Secondary | ICD-10-CM | POA: Diagnosis not present

## 2011-07-11 DIAGNOSIS — Z125 Encounter for screening for malignant neoplasm of prostate: Secondary | ICD-10-CM | POA: Diagnosis not present

## 2011-07-11 DIAGNOSIS — I739 Peripheral vascular disease, unspecified: Secondary | ICD-10-CM | POA: Diagnosis not present

## 2011-07-28 ENCOUNTER — Ambulatory Visit (INDEPENDENT_AMBULATORY_CARE_PROVIDER_SITE_OTHER): Payer: Medicare Other | Admitting: *Deleted

## 2011-07-28 DIAGNOSIS — I4891 Unspecified atrial fibrillation: Secondary | ICD-10-CM | POA: Diagnosis not present

## 2011-07-28 LAB — POCT INR: INR: 2.3

## 2011-08-21 ENCOUNTER — Other Ambulatory Visit: Payer: Self-pay | Admitting: Cardiology

## 2011-08-24 ENCOUNTER — Other Ambulatory Visit: Payer: Self-pay | Admitting: Cardiology

## 2011-08-24 MED ORDER — RAMIPRIL 10 MG PO CAPS: 10.0000 mg | ORAL_CAPSULE | Freq: Two times a day (BID) | ORAL | Status: DC

## 2011-09-08 ENCOUNTER — Ambulatory Visit (INDEPENDENT_AMBULATORY_CARE_PROVIDER_SITE_OTHER): Payer: Medicare Other | Admitting: Pharmacist

## 2011-09-08 DIAGNOSIS — I4891 Unspecified atrial fibrillation: Secondary | ICD-10-CM

## 2011-09-08 LAB — POCT INR: INR: 2.9

## 2011-09-08 NOTE — Patient Instructions (Signed)
Patient has started taking tumeric.  Instructed to add extra serving of greens (vit K) per week.

## 2011-09-14 DIAGNOSIS — H353290 Exudative age-related macular degeneration, unspecified eye, stage unspecified: Secondary | ICD-10-CM | POA: Diagnosis not present

## 2011-10-20 ENCOUNTER — Ambulatory Visit (INDEPENDENT_AMBULATORY_CARE_PROVIDER_SITE_OTHER): Payer: Medicare Other | Admitting: *Deleted

## 2011-10-20 DIAGNOSIS — I4891 Unspecified atrial fibrillation: Secondary | ICD-10-CM | POA: Diagnosis not present

## 2011-10-20 LAB — POCT INR: INR: 2.4

## 2011-11-02 DIAGNOSIS — R209 Unspecified disturbances of skin sensation: Secondary | ICD-10-CM | POA: Diagnosis not present

## 2011-11-02 DIAGNOSIS — M545 Low back pain, unspecified: Secondary | ICD-10-CM | POA: Diagnosis not present

## 2011-11-14 DIAGNOSIS — Z23 Encounter for immunization: Secondary | ICD-10-CM | POA: Diagnosis not present

## 2011-11-21 ENCOUNTER — Encounter: Payer: Self-pay | Admitting: Cardiology

## 2011-11-21 ENCOUNTER — Ambulatory Visit (INDEPENDENT_AMBULATORY_CARE_PROVIDER_SITE_OTHER): Payer: Medicare Other | Admitting: Cardiology

## 2011-11-21 VITALS — BP 126/70 | HR 82 | Ht 69.0 in | Wt 195.0 lb

## 2011-11-21 DIAGNOSIS — I359 Nonrheumatic aortic valve disorder, unspecified: Secondary | ICD-10-CM | POA: Diagnosis not present

## 2011-11-21 DIAGNOSIS — I350 Nonrheumatic aortic (valve) stenosis: Secondary | ICD-10-CM

## 2011-11-21 DIAGNOSIS — I2510 Atherosclerotic heart disease of native coronary artery without angina pectoris: Secondary | ICD-10-CM

## 2011-11-21 DIAGNOSIS — I4891 Unspecified atrial fibrillation: Secondary | ICD-10-CM | POA: Diagnosis not present

## 2011-11-21 DIAGNOSIS — E7800 Pure hypercholesterolemia, unspecified: Secondary | ICD-10-CM | POA: Diagnosis not present

## 2011-11-21 DIAGNOSIS — R0989 Other specified symptoms and signs involving the circulatory and respiratory systems: Secondary | ICD-10-CM | POA: Insufficient documentation

## 2011-11-21 NOTE — Assessment & Plan Note (Signed)
Stable.  Not severe.  Will repeat in spring  (18 months)

## 2011-11-21 NOTE — Assessment & Plan Note (Signed)
No current angina.  Continue to monitor.

## 2011-11-21 NOTE — Assessment & Plan Note (Signed)
Followed by Dr. Aronson. 

## 2011-11-21 NOTE — Assessment & Plan Note (Signed)
Rate is controlled and on warfarin.

## 2011-11-21 NOTE — Patient Instructions (Addendum)
Your physician recommends that you schedule a follow-up appointment in: MARCH 2014  Your physician has requested that you have an echocardiogram in [facility] 2014. Echocardiography is a painless test that uses sound waves to create images of your heart. It provides your doctor with information about the size and shape of your heart and how well your heart's chambers and valves are working. This procedure takes approximately one hour. There are no restrictions for this procedure.  Your physician recommends that you continue on your current medications as directed. Please refer to the Current Medication list given to you today.  Your physician has requested that you have a carotid duplex. This test is an ultrasound of the carotid arteries in your neck. It looks at blood flow through these arteries that supply the brain with blood. Allow one hour for this exam. There are no restrictions or special instructions.

## 2011-11-21 NOTE — Progress Notes (Signed)
HPI:   Is in for followup. Overall he is doing extremely well. The biggest problem continues to be his right knee. He is able to play golf on a regular basis, and has not had any anginal type chest pain or significant shortness of breath. The patient has multiple cardiac problems, including coronary artery disease, atrial fibrillation with controlled ventricular response, and aortic valve disease. He is asymptomatic at present time  Current Outpatient Prescriptions  Medication Sig Dispense Refill  . atorvastatin (LIPITOR) 20 MG tablet Take 1 tablet (20 mg total) by mouth daily.  90 tablet  3  . Cholecalciferol (VITAMIN D-3) 5000 UNITS TABS Take 5,000 Units by mouth daily.       . Coenzyme Q10 (CO Q 10) 100 MG CAPS Take 1 capsule by mouth 2 (two) times daily.      Marland Kitchen COUMADIN 5 MG tablet TAKE AS DIRECTED BY ANTICOAGULATION CLINIC.  40 each  3  . ezetimibe (ZETIA) 10 MG tablet Take 1 tablet (10 mg total) by mouth daily.  30 tablet  11  . finasteride (PROSCAR) 5 MG tablet Take 5 mg by mouth daily.       . hydrochlorothiazide (MICROZIDE) 12.5 MG capsule Take 1 capsule (12.5 mg total) by mouth daily.  90 capsule  3  . Ibuprofen (MOTRIN PO) Take 200 mg by mouth as needed. pain      . LORazepam (ATIVAN) 1 MG tablet Take 1 mg by mouth daily.       . Multiple Vitamin (MULTIVITAMIN) tablet Take 1 tablet by mouth daily.      . Multiple Vitamins-Minerals (PRESERVISION/LUTEIN PO) Take 1 tablet by mouth 2 (two) times daily.       . Omega-3 Fatty Acids (FISH OIL) 300 MG CAPS Take 300 mg by mouth 2 (two) times daily.       . Psyllium (METAMUCIL) 30.9 % POWD Take by mouth as needed.       . ramipril (ALTACE) 10 MG capsule Take 1 capsule (10 mg total) by mouth 2 (two) times daily.  60 capsule  5  . Ranibizumab (LUCENTIS) 0.5 MG/0.05ML SOLN Inject 0.5 mg into the eye See admin instructions. Pt gets this injection every 9 weeks. Next treatment is due on 06-22-11. Pt is followed by [facility].      .  verapamil (VERELAN PM) 240 MG 24 hr capsule Take 1 capsule (240 mg total) by mouth daily.  90 capsule  3    No Known Allergies  Past Medical History  Diagnosis Date  . CAD (coronary artery disease)     with multiple percutaneous prior coronary  artery interventions  . PAF (paroxysmal atrial fibrillation)   . PVD (peripheral vascular disease)   . Hypercholesteremia   . SSS (sick sinus syndrome)   . Edema   . Hypertension   . GERD (gastroesophageal reflux disease)   . Cancer     hx of skin cancer   . Arthritis     knees   . Pulmonary embolism     1964 related to dislocated hip on right     Past Surgical History  Procedure Date  . Angioplasty   . Orthopedic surgery   . Coronary angioplasty   . Hernia repair     right inguinal hernia repair   . Abdominal aortic aneurysm repair     1993   . Other surgical history     right knee popliteal aneurysm surgery stent placed   . Tonsillectomy   .  Other surgical history   . Hemorrhoid surgery     1973  . Eye surgery     hx of cataract surgery     No family history on file.  History   Social History  . Marital Status: Married    Spouse Name: N/A    Number of Children: N/A  . Years of Education: N/A   Occupational History  . Not on file.   Social History Main Topics  . Smoking status: Former Smoker    Types: Cigarettes    Quit date: 02/14/1984  . Smokeless tobacco: Never Used  . Alcohol Use: 4.2 oz/week    7 Glasses of wine per week     5 ounces of wine daily   . Drug Use: No  . Sexually Active: Not on file   Other Topics Concern  . Not on file   Social History Narrative  . No narrative on file    ROS: Please see the HPI.  All other systems reviewed and negative.  PHYSICAL EXAM:  BP 126/70  Pulse 82  Ht 5\' 9"  (1.753 m)  Wt 195 lb (88.451 kg)  BMI 28.80 kg/m2  General: Well developed, well nourished, in no acute distress. Head:  Normocephalic and atraumatic. Neck: no JVD.  Bilateral carotid  bruits.   Lungs: Clear to auscultation and percussion. Heart: Normal S1 and S2.  Irregularly irregular rhythm, with 2/6 SEM.  No DM.   Pulses: Pulses normal in all 4 extremities. Extremities: No clubbing or cyanosis. No edema. Neurologic: Alert and oriented x 3.  EKG:  Atrial fib/flutter with controlled ventricular response.  RBBB.    ASSESSMENT AND PLAN:

## 2011-11-21 NOTE — Assessment & Plan Note (Signed)
Will check carotids for disease.

## 2011-11-28 ENCOUNTER — Encounter (INDEPENDENT_AMBULATORY_CARE_PROVIDER_SITE_OTHER): Payer: Medicare Other

## 2011-11-28 ENCOUNTER — Ambulatory Visit (INDEPENDENT_AMBULATORY_CARE_PROVIDER_SITE_OTHER): Payer: Medicare Other | Admitting: Pharmacist

## 2011-11-28 DIAGNOSIS — I2510 Atherosclerotic heart disease of native coronary artery without angina pectoris: Secondary | ICD-10-CM

## 2011-11-28 DIAGNOSIS — I4891 Unspecified atrial fibrillation: Secondary | ICD-10-CM

## 2011-11-28 DIAGNOSIS — I350 Nonrheumatic aortic (valve) stenosis: Secondary | ICD-10-CM

## 2011-11-28 DIAGNOSIS — R0989 Other specified symptoms and signs involving the circulatory and respiratory systems: Secondary | ICD-10-CM

## 2011-11-28 DIAGNOSIS — E7800 Pure hypercholesterolemia, unspecified: Secondary | ICD-10-CM

## 2011-11-28 DIAGNOSIS — I6529 Occlusion and stenosis of unspecified carotid artery: Secondary | ICD-10-CM | POA: Diagnosis not present

## 2011-11-28 LAB — POCT INR: INR: 2

## 2011-11-30 DIAGNOSIS — H353190 Nonexudative age-related macular degeneration, unspecified eye, stage unspecified: Secondary | ICD-10-CM | POA: Diagnosis not present

## 2011-11-30 DIAGNOSIS — H353290 Exudative age-related macular degeneration, unspecified eye, stage unspecified: Secondary | ICD-10-CM | POA: Diagnosis not present

## 2011-12-07 ENCOUNTER — Other Ambulatory Visit: Payer: Self-pay | Admitting: Cardiology

## 2011-12-17 ENCOUNTER — Other Ambulatory Visit: Payer: Self-pay | Admitting: Cardiology

## 2011-12-21 DIAGNOSIS — Z961 Presence of intraocular lens: Secondary | ICD-10-CM | POA: Diagnosis not present

## 2011-12-21 DIAGNOSIS — H353290 Exudative age-related macular degeneration, unspecified eye, stage unspecified: Secondary | ICD-10-CM | POA: Diagnosis not present

## 2011-12-21 DIAGNOSIS — H353190 Nonexudative age-related macular degeneration, unspecified eye, stage unspecified: Secondary | ICD-10-CM | POA: Diagnosis not present

## 2011-12-25 DIAGNOSIS — N400 Enlarged prostate without lower urinary tract symptoms: Secondary | ICD-10-CM | POA: Diagnosis not present

## 2011-12-25 DIAGNOSIS — R3129 Other microscopic hematuria: Secondary | ICD-10-CM | POA: Diagnosis not present

## 2011-12-25 DIAGNOSIS — N529 Male erectile dysfunction, unspecified: Secondary | ICD-10-CM | POA: Diagnosis not present

## 2011-12-29 DIAGNOSIS — IMO0002 Reserved for concepts with insufficient information to code with codable children: Secondary | ICD-10-CM | POA: Diagnosis not present

## 2011-12-29 DIAGNOSIS — M1710 Unilateral primary osteoarthritis, unspecified knee: Secondary | ICD-10-CM | POA: Diagnosis not present

## 2012-01-09 ENCOUNTER — Ambulatory Visit (INDEPENDENT_AMBULATORY_CARE_PROVIDER_SITE_OTHER): Payer: Medicare Other

## 2012-01-09 ENCOUNTER — Telehealth: Payer: Self-pay

## 2012-01-09 DIAGNOSIS — I4891 Unspecified atrial fibrillation: Secondary | ICD-10-CM

## 2012-01-09 LAB — POCT INR: INR: 2.6

## 2012-01-09 NOTE — Telephone Encounter (Signed)
Pt seen in [facility] today needs clearance to hold Coumadin x 5 days prior to partial knee replacement surgery on 01/22/12 by Dr Charlann Boxer. Please advise if ok for pt to hold Coumadin.  Thanks

## 2012-01-10 DIAGNOSIS — I10 Essential (primary) hypertension: Secondary | ICD-10-CM | POA: Diagnosis not present

## 2012-01-10 DIAGNOSIS — I2510 Atherosclerotic heart disease of native coronary artery without angina pectoris: Secondary | ICD-10-CM | POA: Diagnosis not present

## 2012-01-10 DIAGNOSIS — E785 Hyperlipidemia, unspecified: Secondary | ICD-10-CM | POA: Diagnosis not present

## 2012-01-10 DIAGNOSIS — I4891 Unspecified atrial fibrillation: Secondary | ICD-10-CM | POA: Diagnosis not present

## 2012-01-10 NOTE — Telephone Encounter (Signed)
Dr Riley Kill aware that the pt needs surgery on 01/22/12.  The pt will need approval to start holding Coumadin on 01/17/12.  Dr Riley Kill will review the pt's chart and make further recommendations about clearance.  (Surgical Clearance note is in Dr Rosalyn Charters folder).

## 2012-01-15 ENCOUNTER — Encounter (HOSPITAL_COMMUNITY): Payer: Self-pay | Admitting: Pharmacy Technician

## 2012-01-15 NOTE — Telephone Encounter (Signed)
Will forward to Julieta Gutting, RN for further review.

## 2012-01-15 NOTE — Telephone Encounter (Signed)
F/u   Clydie Braun from [facility], called to f/u on surgical clearance & coumadin hold for pt upcoming surgical procedure 01/22/12. plz return call and information to Clydie Braun [phone].

## 2012-01-16 ENCOUNTER — Telehealth: Payer: Self-pay | Admitting: Cardiology

## 2012-01-16 MED ORDER — WARFARIN SODIUM 5 MG PO TABS: 5.0000 mg | ORAL_TABLET | Freq: Every day | ORAL | Status: DC

## 2012-01-16 NOTE — Telephone Encounter (Signed)
Tyler Williams with dr Nils[facility] Nutting office has faxed a surgical clearance request three times and called yesterday, I don't see that documented call, pt having partial knee replacement on 01-22-12 and needs to be cleared for surgery, so they need it asap

## 2012-01-17 ENCOUNTER — Encounter (HOSPITAL_COMMUNITY)
Admission: RE | Admit: 2012-01-17 | Discharge: 2012-01-17 | Disposition: A | Discharge status: Home / Self Care | Payer: Medicare Other | Source: Ambulatory Visit | Attending: Orthopedic Surgery | Admitting: Orthopedic Surgery

## 2012-01-17 ENCOUNTER — Encounter (HOSPITAL_COMMUNITY): Payer: Self-pay

## 2012-01-17 DIAGNOSIS — E663 Overweight: Secondary | ICD-10-CM | POA: Diagnosis present

## 2012-01-17 DIAGNOSIS — E871 Hypo-osmolality and hyponatremia: Secondary | ICD-10-CM | POA: Diagnosis not present

## 2012-01-17 DIAGNOSIS — H353290 Exudative age-related macular degeneration, unspecified eye, stage unspecified: Secondary | ICD-10-CM | POA: Diagnosis not present

## 2012-01-17 DIAGNOSIS — M1710 Unilateral primary osteoarthritis, unspecified knee: Secondary | ICD-10-CM | POA: Diagnosis not present

## 2012-01-17 DIAGNOSIS — Z961 Presence of intraocular lens: Secondary | ICD-10-CM | POA: Diagnosis not present

## 2012-01-17 DIAGNOSIS — M25569 Pain in unspecified knee: Secondary | ICD-10-CM | POA: Diagnosis not present

## 2012-01-17 DIAGNOSIS — I2510 Atherosclerotic heart disease of native coronary artery without angina pectoris: Secondary | ICD-10-CM | POA: Diagnosis not present

## 2012-01-17 DIAGNOSIS — IMO0002 Reserved for concepts with insufficient information to code with codable children: Secondary | ICD-10-CM | POA: Diagnosis not present

## 2012-01-17 DIAGNOSIS — I10 Essential (primary) hypertension: Secondary | ICD-10-CM | POA: Diagnosis not present

## 2012-01-17 DIAGNOSIS — Z01812 Encounter for preprocedural laboratory examination: Secondary | ICD-10-CM | POA: Diagnosis not present

## 2012-01-17 LAB — BASIC METABOLIC PANEL
BUN: 19 mg/dL (ref 6–23)
CO2: 27 mEq/L (ref 19–32)
Calcium: 9.5 mg/dL (ref 8.4–10.5)
Chloride: 98 mEq/L (ref 96–112)
Creatinine, Ser: 0.9 mg/dL (ref 0.50–1.35)
GFR calc Af Amer: 89 mL/min — ABNORMAL LOW (ref >90)
GFR calc non Af Amer: 77 mL/min — ABNORMAL LOW (ref >90)
Glucose, Bld: 96 mg/dL (ref 70–99)
Potassium: 4.5 mEq/L (ref 3.5–5.1)
Sodium: 135 mEq/L (ref 135–145)

## 2012-01-17 LAB — URINALYSIS, ROUTINE W REFLEX MICROSCOPIC
Bilirubin Urine: NEGATIVE
Glucose, UA: NEGATIVE mg/dL
Ketones, ur: NEGATIVE mg/dL
Nitrite: NEGATIVE
Protein, ur: NEGATIVE mg/dL
Specific Gravity, Urine: 1.011 (ref 1.005–1.030)
Urobilinogen, UA: 1 mg/dL (ref 0.0–1.0)
pH: 6 (ref 5.0–8.0)

## 2012-01-17 LAB — CBC
HCT: 47.4 % (ref 39.0–52.0)
Hemoglobin: 16.5 g/dL (ref 13.0–17.0)
MCH: 32.9 pg (ref 26.0–34.0)
MCHC: 34.8 g/dL (ref 30.0–36.0)
MCV: 94.4 fL (ref 78.0–100.0)
Platelets: 169 10*3/uL (ref 150–400)
RBC: 5.02 MIL/uL (ref 4.22–5.81)
RDW: 13.6 % (ref 11.5–15.5)
WBC: 7.8 10*3/uL (ref 4.0–10.5)

## 2012-01-17 LAB — SURGICAL PCR SCREEN
MRSA, PCR: NEGATIVE
Staphylococcus aureus: NEGATIVE

## 2012-01-17 LAB — URINE MICROSCOPIC-ADD ON

## 2012-01-17 LAB — PROTIME-INR
INR: 2.2 — ABNORMAL HIGH (ref 0.00–1.49)
Prothrombin Time: 23.5 seconds — ABNORMAL HIGH (ref 11.6–15.2)

## 2012-01-17 LAB — APTT: aPTT: 53 seconds — ABNORMAL HIGH (ref 24–37)

## 2012-01-17 NOTE — Patient Instructions (Addendum)
20 Tyler Williams  01/17/2012   Your procedure is scheduled on:   01-22-2012  Report to [facility] at      0930  AM .  Call this number if you have problems the morning of surgery: [phone]  Or Presurgical Testing [phone](Akita Maxim)   Remember:   Do not eat food:After Midnight.   Take these medicines the morning of surgery with A SIP OF WATER: Verapamil. Bring use Nasal Spray.   Do not wear jewelry, make-up or nail polish.  Do not wear lotions, powders, or perfumes. You may wear deodorant.  Do not shave 48 hours prior to surgery.(face and neck okay, no shaving of legs)  Do not bring valuables to the hospital.  Contacts, dentures or bridgework may not be worn into surgery.  Leave suitcase in the car. After surgery it may be brought to your room.  For patients admitted to the hospital, checkout time is 11:00 AM the day of discharge.   Patients discharged the day of surgery will not be allowed to drive home. Must have responsible person with you x 24 hours once discharged.  Name and phone number of your driver: lenford, beddow [phone]  Special Instructions: CHG Shower Use Special Wash: See special instructions.(avoid face and genitals)   Please read over the following fact sheets that you were given: MRSA Information, Blood Transfusion fact sheet, Incentive Spirometry Instruction.        Failure to follow these instructions may result in Cancellation of your surgery.   Patient Signature_____________________________________________________

## 2012-01-17 NOTE — Pre-Procedure Instructions (Addendum)
01-17-12 EKG10'13/ CXR 3'13 -Epic.W. Cherene Dobbins,RN 01-17-12 1705 Labs faxed to Dr. Nilsa Nutting office -noting U/a, and PT/PTT results.W. Revere Maahs,RN 01-19-12 0800- fax received 01-18-12 pt. Had Rx. For Cipro 500mg  orally 1 twice daily x3 days called into [facility].W. Kennon Portela

## 2012-01-17 NOTE — Telephone Encounter (Signed)
Dr Riley Kill reviewed the pt's chart and pt cleared for surgery.  Dr Riley Kill completed clearance form and this was faxed to Clydie Braun. I spoke with Clydie Braun and confirmed that she did receive fax.

## 2012-01-17 NOTE — Progress Notes (Signed)
Labs viewable in Epic -note urine, Pt/PTT -will be repeated AM of surgery. W.Shandon Matson,RN

## 2012-01-18 DIAGNOSIS — Z961 Presence of intraocular lens: Secondary | ICD-10-CM | POA: Diagnosis not present

## 2012-01-18 DIAGNOSIS — H353290 Exudative age-related macular degeneration, unspecified eye, stage unspecified: Secondary | ICD-10-CM | POA: Diagnosis not present

## 2012-01-18 NOTE — Progress Notes (Signed)
Medical clearance note Dr. Jacky Kindle on chart.

## 2012-01-19 LAB — URINE CULTURE
Colony Count: NO GROWTH
Culture: NO GROWTH

## 2012-01-21 NOTE — H&P (Signed)
TOTAL KNEE ADMISSION H&P  Patient is being admitted for right unicompartmental knee arthroplasty.  Subjective:  Chief Complaint:right knee medial aspect OA / pain.  HPI: Tyler Williams, 78 y.o. male, has a history of pain and functional disability in the right knee due to arthritis and has failed non-surgical conservative treatments for greater than 12 weeks to includeNSAID's and/or analgesics, corticosteriod injections, viscosupplementation injections and activity modification.  Onset of symptoms was gradual, starting 3 years ago with gradually worsening course since that time. The patient noted no past surgery on the right knee(s).  Patient currently rates pain in the right knee(s) at 6 out of 10 with activity. Patient has worsening of pain with activity and weight bearing, pain that interferes with activities of daily living, crepitus and joint swelling.  Patient has evidence of periarticular osteophytes and joint space narrowing by imaging studies.  There is no active infection. Risks, benefits and expectations were discussed with the patient. Patient understand the risks, benefits and expectations and wishes to proceed with surgery.   D/C Plans:  Home with HHPT  Post-op Meds: ?  Tranexamic Acid:  Not to be given - A-fib, on Coumadin  Decadron:   To be given  FYI:  Will need to go back on Coumadin with branching Lovemox  Patient Active Problem List   Diagnosis Date Noted  . Carotid artery bruit 11/21/2011  . Carotid bruit 11/21/2011  . Aortic stenosis 01/01/2011  . EDEMA 07/14/2009  . HYPERTENSION, BENIGN 01/27/2009  . HYPERCHOLESTEROLEMIA  IIA 10/21/2008  . CAD, NATIVE VESSEL 10/21/2008  . ATRIAL FIBRILLATION 10/21/2008   Past Medical History  Diagnosis Date  . CAD (coronary artery disease)     with multiple percutaneous prior coronary  artery interventions  . PAF (paroxysmal atrial fibrillation)   . PVD (peripheral vascular disease)   . Hypercholesteremia   . SSS (sick  sinus syndrome)   . Edema     lower extremity-right greater than left  . Hypertension   . GERD (gastroesophageal reflux disease)   . Cancer     hx of skin cancer   . Arthritis     knees   . Pulmonary embolism     1964 related to dislocated hip on right     Past Surgical History  Procedure Date  . Angioplasty   . Orthopedic surgery   . Coronary angioplasty   . Hernia repair     right inguinal hernia repair   . Abdominal aortic aneurysm repair     1993   . Other surgical history     right knee popliteal aneurysm surgery stent placed   . Tonsillectomy   . Other surgical history   . Hemorrhoid surgery     1973  . Eye surgery     hx of cataract surgery   . Joint replacement     left knee replacement    No prescriptions prior to admission   No Known Allergies  History  Substance Use Topics  . Smoking status: Former Smoker    Types: Cigarettes    Quit date: 02/14/1984  . Smokeless tobacco: Never Used  . Alcohol Use: 4.2 oz/week    7 Glasses of wine per week     Comment: 5 ounces of wine daily     No family history on file.   Review of Systems  Constitutional: Negative.   HENT: Positive for tinnitus.   Eyes: Positive for blurred vision.  Respiratory: Negative.   Cardiovascular: Negative.   Gastrointestinal:  Positive for constipation.  Genitourinary: Positive for urgency and frequency.  Musculoskeletal: Positive for back pain and joint pain.  Skin: Negative.   Neurological: Negative.   Endo/Heme/Allergies: Negative.   Psychiatric/Behavioral: Negative.     Objective:  Physical Exam  Constitutional: He is oriented to person, place, and time. He appears well-developed and well-nourished.  HENT:  Head: Normocephalic and atraumatic.  Mouth/Throat: Oropharynx is clear and moist.  Eyes: Pupils are equal, round, and reactive to light.  Neck: Neck supple. No JVD present. No tracheal deviation present. No thyromegaly present.  Cardiovascular: Normal rate and intact  distal pulses.   Respiratory: Effort normal and breath sounds normal. No stridor. No respiratory distress. He has no wheezes. He has no rales. He exhibits no tenderness.  GI: Soft. There is no tenderness. There is no guarding.  Musculoskeletal:       Right knee: He exhibits decreased range of motion, swelling and bony tenderness. He exhibits no ecchymosis, no deformity and no laceration. tenderness found. Medial joint line and MCL tenderness noted. No lateral joint line and no LCL tenderness noted.  Lymphadenopathy:    He has no cervical adenopathy.  Neurological: He is alert and oriented to person, place, and time.  Skin: Skin is warm and dry.  Psychiatric: He has a normal mood and affect.    Labs:  Estimated Body mass index is 28.80 kg/(m^2) as calculated from the following:   Height as of 11/21/11: 5\' 9" (1.753 m).   Weight as of 11/21/11: 195 lb(88.451 kg).   Imaging Review Plain radiographs demonstrate moderate degenerative joint disease of the right knee(s). The overall alignment isneutral. The bone quality appears to be good for age and reported activity level.  Assessment/Plan:  End stage arthritis, right knee medial compartment   The patient history, physical examination, clinical judgment of the provider and imaging studies are consistent with end stage degenerative joint disease of the right knee(s) and total knee arthroplasty is deemed medically necessary. The treatment options including medical management, injection therapy arthroscopy and arthroplasty were discussed at length. The risks and benefits of total knee arthroplasty were presented and reviewed. The risks due to aseptic loosening, infection, stiffness, patella tracking problems, thromboembolic complications and other imponderables were discussed. The patient acknowledged the explanation, agreed to proceed with the plan and consent was signed. Patient is being admitted for inpatient treatment for surgery, pain control,  PT, OT, prophylactic antibiotics, VTE prophylaxis, progressive ambulation and ADL's and discharge planning. The patient is planning to be discharged home with home health services.   [name] Lachae Hohler   PAC  01/21/2012, 9:47 PM

## 2012-01-22 ENCOUNTER — Encounter (HOSPITAL_COMMUNITY): Payer: Self-pay | Admitting: *Deleted

## 2012-01-22 ENCOUNTER — Encounter (HOSPITAL_COMMUNITY): Payer: Self-pay | Admitting: Anesthesiology

## 2012-01-22 ENCOUNTER — Encounter (HOSPITAL_COMMUNITY)
Admission: RE | Disposition: A | Discharge status: Home Health | Payer: Self-pay | Source: Ambulatory Visit | Attending: Orthopedic Surgery

## 2012-01-22 ENCOUNTER — Inpatient Hospital Stay (HOSPITAL_COMMUNITY)
Admission: RE | Admit: 2012-01-22 | Discharge: 2012-01-23 | DRG: 470 | Disposition: A | Discharge status: Home Health | Payer: Medicare Other | Source: Ambulatory Visit | Attending: Orthopedic Surgery | Admitting: Orthopedic Surgery

## 2012-01-22 ENCOUNTER — Inpatient Hospital Stay (HOSPITAL_COMMUNITY): Payer: Medicare Other | Admitting: Anesthesiology

## 2012-01-22 DIAGNOSIS — Z96651 Presence of right artificial knee joint: Secondary | ICD-10-CM

## 2012-01-22 DIAGNOSIS — Z01812 Encounter for preprocedural laboratory examination: Secondary | ICD-10-CM

## 2012-01-22 DIAGNOSIS — I10 Essential (primary) hypertension: Secondary | ICD-10-CM | POA: Diagnosis present

## 2012-01-22 DIAGNOSIS — M1710 Unilateral primary osteoarthritis, unspecified knee: Principal | ICD-10-CM | POA: Diagnosis present

## 2012-01-22 DIAGNOSIS — E871 Hypo-osmolality and hyponatremia: Secondary | ICD-10-CM

## 2012-01-22 DIAGNOSIS — E663 Overweight: Secondary | ICD-10-CM

## 2012-01-22 DIAGNOSIS — I2510 Atherosclerotic heart disease of native coronary artery without angina pectoris: Secondary | ICD-10-CM | POA: Diagnosis present

## 2012-01-22 HISTORY — PX: PARTIAL KNEE ARTHROPLASTY: SHX2174

## 2012-01-22 LAB — PROTIME-INR
INR: 1.04 (ref 0.00–1.49)
Prothrombin Time: 13.5 seconds (ref 11.6–15.2)

## 2012-01-22 LAB — TYPE AND SCREEN
ABO/RH(D): A POS
Antibody Screen: NEGATIVE

## 2012-01-22 SURGERY — ARTHROPLASTY, KNEE, UNICOMPARTMENTAL
Anesthesia: Spinal | Site: Knee | Laterality: Right | Wound class: Clean

## 2012-01-22 MED ORDER — ATORVASTATIN CALCIUM 20 MG PO TABS
20.0000 mg | ORAL_TABLET | Freq: Every evening | ORAL | Status: DC
  Administered 2012-01-22 – 2012-01-23 (×2): 20 mg via ORAL
  Filled 2012-01-22 (×2): qty 1

## 2012-01-22 MED ORDER — BUPIVACAINE IN DEXTROSE 0.75-8.25 % IT SOLN
INTRATHECAL | Status: DC | PRN
  Administered 2012-01-22: 2 mL via INTRATHECAL

## 2012-01-22 MED ORDER — 0.9 % SODIUM CHLORIDE (POUR BTL) OPTIME
TOPICAL | Status: DC | PRN
  Administered 2012-01-22: 1000 mL

## 2012-01-22 MED ORDER — EZETIMIBE 10 MG PO TABS
10.0000 mg | ORAL_TABLET | Freq: Every evening | ORAL | Status: DC
  Administered 2012-01-22: 10 mg via ORAL
  Filled 2012-01-22 (×2): qty 1

## 2012-01-22 MED ORDER — FENTANYL CITRATE 0.05 MG/ML IJ SOLN
INTRAMUSCULAR | Status: DC | PRN
  Administered 2012-01-22: 100 ug via INTRAVENOUS

## 2012-01-22 MED ORDER — POLYETHYLENE GLYCOL 3350 17 G PO PACK
17.0000 g | PACK | Freq: Two times a day (BID) | ORAL | Status: DC
  Administered 2012-01-22 – 2012-01-23 (×2): 17 g via ORAL

## 2012-01-22 MED ORDER — DEXAMETHASONE SODIUM PHOSPHATE 10 MG/ML IJ SOLN
INTRAMUSCULAR | Status: DC | PRN
  Administered 2012-01-22: 10 mg via INTRAVENOUS

## 2012-01-22 MED ORDER — LORAZEPAM 1 MG PO TABS
1.0000 mg | ORAL_TABLET | Freq: Every day | ORAL | Status: DC
  Filled 2012-01-22: qty 1

## 2012-01-22 MED ORDER — GUAIFENESIN 100 MG/5ML PO SYRP
200.0000 mg | ORAL_SOLUTION | Freq: Three times a day (TID) | ORAL | Status: DC | PRN
  Filled 2012-01-22: qty 118

## 2012-01-22 MED ORDER — ALUM & MAG HYDROXIDE-SIMETH 200-200-20 MG/5ML PO SUSP: 30.0000 mL | ORAL | Status: DC | PRN

## 2012-01-22 MED ORDER — METHOCARBAMOL 500 MG PO TABS: 500.0000 mg | ORAL_TABLET | Freq: Four times a day (QID) | ORAL | Status: DC | PRN

## 2012-01-22 MED ORDER — CEFAZOLIN SODIUM-DEXTROSE 2-3 GM-% IV SOLR
2.0000 g | Freq: Four times a day (QID) | INTRAVENOUS | Status: AC
  Administered 2012-01-22 – 2012-01-23 (×2): 2 g via INTRAVENOUS
  Filled 2012-01-22 (×2): qty 50

## 2012-01-22 MED ORDER — GUAIFENESIN 100 MG/5ML PO SOLN
10.0000 mL | Freq: Three times a day (TID) | ORAL | Status: DC | PRN
Start: 2012-01-22 — End: 2012-01-23

## 2012-01-22 MED ORDER — ONDANSETRON HCL 4 MG/2ML IJ SOLN: 4.0000 mg | Freq: Four times a day (QID) | INTRAMUSCULAR | Status: DC | PRN

## 2012-01-22 MED ORDER — CHLORHEXIDINE GLUCONATE 4 % EX LIQD: 60.0000 mL | Freq: Once | CUTANEOUS | Status: DC

## 2012-01-22 MED ORDER — BISACODYL 10 MG RE SUPP: 10.0000 mg | Freq: Every day | RECTAL | Status: DC | PRN

## 2012-01-22 MED ORDER — HYDROCHLOROTHIAZIDE 12.5 MG PO CAPS
12.5000 mg | ORAL_CAPSULE | Freq: Every day | ORAL | Status: DC
  Administered 2012-01-23: 12.5 mg via ORAL
  Filled 2012-01-22 (×3): qty 1

## 2012-01-22 MED ORDER — LACTATED RINGERS IV SOLN: INTRAVENOUS | Status: DC

## 2012-01-22 MED ORDER — HYDROMORPHONE HCL PF 1 MG/ML IJ SOLN
0.2500 mg | INTRAMUSCULAR | Status: DC | PRN
Start: 2012-01-22 — End: 2012-01-22

## 2012-01-22 MED ORDER — CEFAZOLIN SODIUM-DEXTROSE 2-3 GM-% IV SOLR
2.0000 g | INTRAVENOUS | Status: AC
  Administered 2012-01-22: 2 g via INTRAVENOUS

## 2012-01-22 MED ORDER — DIPHENHYDRAMINE HCL 25 MG PO CAPS: 25.0000 mg | ORAL_CAPSULE | Freq: Four times a day (QID) | ORAL | Status: DC | PRN

## 2012-01-22 MED ORDER — HYDROCODONE-ACETAMINOPHEN 7.5-325 MG PO TABS
1.0000 | ORAL_TABLET | ORAL | Status: DC
  Administered 2012-01-22 – 2012-01-23 (×3): 1 via ORAL
  Filled 2012-01-22 (×4): qty 1

## 2012-01-22 MED ORDER — VERAPAMIL HCL ER 240 MG PO TBCR
240.0000 mg | EXTENDED_RELEASE_TABLET | Freq: Every day | ORAL | Status: DC
  Administered 2012-01-23: 240 mg via ORAL
  Filled 2012-01-22 (×3): qty 1

## 2012-01-22 MED ORDER — FLEET ENEMA 7-19 GM/118ML RE ENEM: 1.0000 | ENEMA | Freq: Once | RECTAL | Status: AC | PRN

## 2012-01-22 MED ORDER — PHENOL 1.4 % MT LIQD: 1.0000 | OROMUCOSAL | Status: DC | PRN

## 2012-01-22 MED ORDER — ZOLPIDEM TARTRATE 5 MG PO TABS: 5.0000 mg | ORAL_TABLET | Freq: Every evening | ORAL | Status: DC | PRN

## 2012-01-22 MED ORDER — MENTHOL 3 MG MT LOZG: 1.0000 | LOZENGE | OROMUCOSAL | Status: DC | PRN

## 2012-01-22 MED ORDER — ONDANSETRON HCL 4 MG PO TABS: 4.0000 mg | ORAL_TABLET | Freq: Four times a day (QID) | ORAL | Status: DC | PRN

## 2012-01-22 MED ORDER — WARFARIN - PHARMACIST DOSING INPATIENT: Freq: Every day | Status: DC

## 2012-01-22 MED ORDER — KETOROLAC TROMETHAMINE 30 MG/ML IJ SOLN
INTRAMUSCULAR | Status: DC | PRN
  Administered 2012-01-22: 30 mg

## 2012-01-22 MED ORDER — DOCUSATE SODIUM 100 MG PO CAPS
100.0000 mg | ORAL_CAPSULE | Freq: Two times a day (BID) | ORAL | Status: DC
  Administered 2012-01-22 – 2012-01-23 (×2): 100 mg via ORAL

## 2012-01-22 MED ORDER — METHOCARBAMOL 100 MG/ML IJ SOLN
500.0000 mg | Freq: Four times a day (QID) | INTRAVENOUS | Status: DC | PRN
  Filled 2012-01-22 (×2): qty 5

## 2012-01-22 MED ORDER — BUPIVACAINE-EPINEPHRINE 0.25% -1:200000 IJ SOLN
INTRAMUSCULAR | Status: DC | PRN
  Administered 2012-01-22: 50 mL

## 2012-01-22 MED ORDER — WARFARIN SODIUM 4 MG PO TABS
8.0000 mg | ORAL_TABLET | Freq: Once | ORAL | Status: AC
  Administered 2012-01-22: 8 mg via ORAL
  Filled 2012-01-22: qty 2

## 2012-01-22 MED ORDER — SALINE SPRAY 0.65 % NA SOLN: 1.0000 | NASAL | Status: DC | PRN

## 2012-01-22 MED ORDER — SODIUM CHLORIDE 0.9 % IV SOLN
INTRAVENOUS | Status: DC
  Administered 2012-01-22 – 2012-01-23 (×2): via INTRAVENOUS
  Filled 2012-01-22 (×4): qty 1000

## 2012-01-22 MED ORDER — FERROUS SULFATE 325 (65 FE) MG PO TABS
325.0000 mg | ORAL_TABLET | Freq: Three times a day (TID) | ORAL | Status: DC
  Administered 2012-01-22 – 2012-01-23 (×2): 325 mg via ORAL
  Filled 2012-01-22 (×5): qty 1

## 2012-01-22 MED ORDER — LACTATED RINGERS IV SOLN
INTRAVENOUS | Status: DC
  Administered 2012-01-22: 1000 mL via INTRAVENOUS

## 2012-01-22 MED ORDER — ENOXAPARIN SODIUM 40 MG/0.4ML ~~LOC~~ SOLN
40.0000 mg | SUBCUTANEOUS | Status: DC
  Filled 2012-01-22: qty 0.4

## 2012-01-22 MED ORDER — CELECOXIB 200 MG PO CAPS
200.0000 mg | ORAL_CAPSULE | Freq: Two times a day (BID) | ORAL | Status: DC
  Administered 2012-01-22: 200 mg via ORAL
  Filled 2012-01-22 (×3): qty 1

## 2012-01-22 MED ORDER — MIDAZOLAM HCL 5 MG/5ML IJ SOLN
INTRAMUSCULAR | Status: DC | PRN
  Administered 2012-01-22: 2 mg via INTRAVENOUS

## 2012-01-22 MED ORDER — ENOXAPARIN SODIUM 40 MG/0.4ML ~~LOC~~ SOLN
40.0000 mg | SUBCUTANEOUS | Status: DC
  Administered 2012-01-23: 40 mg via SUBCUTANEOUS
  Filled 2012-01-22 (×2): qty 0.4

## 2012-01-22 MED ORDER — ACETAMINOPHEN 10 MG/ML IV SOLN
INTRAVENOUS | Status: DC | PRN
  Administered 2012-01-22: 1000 mg via INTRAVENOUS

## 2012-01-22 MED ORDER — HYDROMORPHONE HCL PF 1 MG/ML IJ SOLN: 0.5000 mg | INTRAMUSCULAR | Status: DC | PRN

## 2012-01-22 MED ORDER — PROPOFOL INFUSION 10 MG/ML OPTIME
INTRAVENOUS | Status: DC | PRN
  Administered 2012-01-22: 140 ug/kg/min via INTRAVENOUS

## 2012-01-22 SURGICAL SUPPLY — 55 items
ADH SKN CLS APL DERMABOND .7 (GAUZE/BANDAGES/DRESSINGS) ×1
BAG SPEC THK2 15X12 ZIP CLS (MISCELLANEOUS) ×1
BAG ZIPLOCK 12X15 (MISCELLANEOUS) ×2 IMPLANT
BANDAGE ELASTIC 6 VELCRO ST LF (GAUZE/BANDAGES/DRESSINGS) ×2 IMPLANT
BANDAGE ESMARK 6X9 LF (GAUZE/BANDAGES/DRESSINGS) ×1 IMPLANT
BLADE SAW RECIPROCATING 77.5 (BLADE) ×2 IMPLANT
BLADE SAW SGTL 13.0X1.19X90.0M (BLADE) ×2 IMPLANT
BNDG CMPR 9X6 STRL LF SNTH (GAUZE/BANDAGES/DRESSINGS) ×1
BNDG ESMARK 6X9 LF (GAUZE/BANDAGES/DRESSINGS) ×2
BOWL SMART MIX CTS (DISPOSABLE) ×2 IMPLANT
CEMENT HV SMART SET (Cement) ×2 IMPLANT
CLOTH BEACON ORANGE TIMEOUT ST (SAFETY) ×2 IMPLANT
COVER SURGICAL LIGHT HANDLE (MISCELLANEOUS) ×2 IMPLANT
CUFF TOURN SGL QUICK 34 (TOURNIQUET CUFF) ×2
CUFF TRNQT CYL 34X4X40X1 (TOURNIQUET CUFF) ×1 IMPLANT
DERMABOND ADVANCED (GAUZE/BANDAGES/DRESSINGS) ×1
DERMABOND ADVANCED .7 DNX12 (GAUZE/BANDAGES/DRESSINGS) ×1 IMPLANT
DRAPE EXTREMITY T 121X128X90 (DRAPE) ×2 IMPLANT
DRAPE POUCH INSTRU U-SHP 10X18 (DRAPES) ×2 IMPLANT
DRSG AQUACEL AG ADV 3.5X 6 (GAUZE/BANDAGES/DRESSINGS) ×2 IMPLANT
DRSG AQUACEL AG ADV 3.5X10 (GAUZE/BANDAGES/DRESSINGS) ×1 IMPLANT
DRSG TEGADERM 4X4.75 (GAUZE/BANDAGES/DRESSINGS) ×2 IMPLANT
DURAPREP 26ML APPLICATOR (WOUND CARE) ×2 IMPLANT
ELECT REM PT RETURN 9FT ADLT (ELECTROSURGICAL) ×2
ELECTRODE REM PT RTRN 9FT ADLT (ELECTROSURGICAL) ×1 IMPLANT
EVACUATOR 1/8 PVC DRAIN (DRAIN) ×2 IMPLANT
FACESHIELD LNG OPTICON STERILE (SAFETY) ×8 IMPLANT
GAUZE SPONGE 2X2 8PLY STRL LF (GAUZE/BANDAGES/DRESSINGS) ×1 IMPLANT
GLOVE BIOGEL PI IND STRL 7.5 (GLOVE) ×1 IMPLANT
GLOVE BIOGEL PI IND STRL 8 (GLOVE) IMPLANT
GLOVE BIOGEL PI INDICATOR 7.5 (GLOVE) ×1
GLOVE BIOGEL PI INDICATOR 8 (GLOVE)
GLOVE ORTHO TXT STRL SZ7.5 (GLOVE) ×4 IMPLANT
GOWN BRE IMP PREV XXLGXLNG (GOWN DISPOSABLE) ×4 IMPLANT
GOWN STRL NON-REIN LRG LVL3 (GOWN DISPOSABLE) ×2 IMPLANT
IMMOBILIZER KNEE 20 (SOFTGOODS) ×2
IMMOBILIZER KNEE 20 THIGH 36 (SOFTGOODS) IMPLANT
KIT BASIN OR (CUSTOM PROCEDURE TRAY) ×2 IMPLANT
LEGGING LITHOTOMY PAIR STRL (DRAPES) ×2 IMPLANT
MANIFOLD NEPTUNE II (INSTRUMENTS) ×2 IMPLANT
NDL SAFETY ECLIPSE 18X1.5 (NEEDLE) ×1 IMPLANT
NEEDLE HYPO 18GX1.5 SHARP (NEEDLE) ×2
PACK TOTAL JOINT (CUSTOM PROCEDURE TRAY) ×2 IMPLANT
POSITIONER SURGICAL ARM (MISCELLANEOUS) ×2 IMPLANT
SPONGE GAUZE 2X2 STER 10/PKG (GAUZE/BANDAGES/DRESSINGS) ×1
SUCTION FRAZIER TIP 10 FR DISP (SUCTIONS) ×2 IMPLANT
SUT MNCRL AB 4-0 PS2 18 (SUTURE) ×2 IMPLANT
SUT VIC AB 1 CT1 36 (SUTURE) ×4 IMPLANT
SUT VIC AB 2-0 CT1 27 (SUTURE) ×4
SUT VIC AB 2-0 CT1 TAPERPNT 27 (SUTURE) ×2 IMPLANT
SUT VLOC 180 0 24IN GS25 (SUTURE) ×1 IMPLANT
SYR 50ML LL SCALE MARK (SYRINGE) ×2 IMPLANT
TOWEL OR 17X26 10 PK STRL BLUE (TOWEL DISPOSABLE) ×4 IMPLANT
TRAY FOLEY CATH 14FRSI W/METER (CATHETERS) ×2 IMPLANT
WATER STERILE IRR 1500ML POUR (IV SOLUTION) ×1 IMPLANT

## 2012-01-22 NOTE — Transfer of Care (Signed)
Immediate Anesthesia Transfer of Care Note  Patient: Tyler Williams  Procedure(s) Performed: Procedure(s) (LRB) with comments: UNICOMPARTMENTAL KNEE (Right)  Patient Location: PACU  Anesthesia Type:Spinal  Level of Consciousness: sedated  Airway & Oxygen Therapy: Patient Spontanous Breathing and Patient connected to face mask oxygen  Post-op Assessment: Report given to PACU RN and Post -op Vital signs reviewed and stable  Post vital signs: Reviewed and stable  Complications: No apparent anesthesia complications

## 2012-01-22 NOTE — Op Note (Signed)
NAME: KHOLE BRANCH    MEDICAL RECORD NO.: [id number]   FACILITY: [facility]   DATE OF BIRTH: 06/02/1926  PHYSICIAN: Madlyn Frankel. Charlann Boxer, M.D.    DATE OF PROCEDURE: 01/22/2012    OPERATIVE REPORT   PREOPERATIVE DIAGNOSIS: Right knee medial compartment osteoarthritis.   POSTOPERATIVE DIAGNOSIS: Right knee medial compartment osteoarthritis.  PROCEDURE: Right partial knee replacement utilizing Biomet Oxford knee  component, size medium femur, a right medial size C tibial tray with a size 5 insert.   SURGEON: Madlyn Frankel. Charlann Boxer, M.D.   ASSISTANT: Lanney Gins, PAC.  Please note that Mr. Carmon Sails was present for the entirety of the case,  utilized for preoperative positioning, perioperative retractor  management, general facilitation of the case and primary wound closure.   ANESTHESIA: Spinal.   SPECIMENS: None.   COMPLICATIONS: None.  DRAINS: None   TOURNIQUET TIME: 34 minutes at 250 mmHg.   INDICATIONS FOR PROCEDURE: Mr. Wach is a 78 yo male patient of mine who presented for evaluation of right knee pain.  He presented with primary complaints of pain on the medial side of their knee. Radiographs revealed advanced medial compartment arthritis with specifically an antero-medial wear pattern.  He had undergone a relatively successful left total knee replacement in the past but was interested in other options as presented as possibility based on presentation of pain.  There was bone on bone changes noted with subchondral sclerosis and osteophytes present. The patient has had progressive problems failing to respond to conservative measures of medications, injections and activity modification. Risks of infection, DVT, component failure, need for future revision surgery were all discussed and reviewed.  Consent was obtained for benefit of pain relief.   PROCEDURE IN DETAIL: The patient was brought to the operative theater.  Once adequate anesthesia, preoperative antibiotics, 2gm Ancef administered,  the patient was positioned in supine position with a right thigh tourniquet  placed. The right lower extremity was prepped and draped in sterile  fashion with the leg on the Oxford leg holder.  The leg was allowed to flex to 120 degrees. A time-out  was performed identifying the patient, planned procedure, and extremity.  The leg was exsanguinated, tourniquet elevated to 250 mmHg. A midline  incision was made from the proximal pole of the patella to the tibial tubercle. A  soft tissue plane was created and partial median arthrotomy was then  made to allow for subluxation of the patella. Following initial synovectomy and  debridement, the osteophytes were removed off the medial aspect of the  knee.   Attention was first directed to the tibia. The tibial  extramedullary guide was positioned over the anterior crest of the tibia  and pinned into position, and using a measured resection guide from the  Oxford system, a 4 mm resection was made off the proximal tibia. First  the reciprocating saw along the medial aspect of the tibial spines, then the oscillating saw.    At this point, I sized this cut surface seem to be best fit for a size C tibial tray.  With the retractors out of the wound and the knee held at 90 degrees the 4 feeler gauge had appropriate tension on the medial ligament.   At this point, the femoral canal was opened with a drill and the  intramedullary rod passed. Then using the guide for a medium posterior resection off  the posterior aspect of the femur was positioned over the mid portion of the medial femoral condyle.  The orientation was  set using the guide that mates the femoral guide to the intramedullary rod.  The 2 drill holes were made into the distal femur.  The posterior guide was then impacted into place and the posterior  femoral cut made.  At this point, I milled the distal femur with a size 4 spigot in place. At this point, we did a trial reduction of the medium  femur, size C tibial tray and a 5 feeler gauge. At 90 degrees of  flexion and at 20 degrees of flexion the knee had symmetric tension on  the ligaments.   Given these findings, the trial femoral component was removed. Final preparation of tibia was carried out by pinning it in position. Then  using a reciprocating saw I removed bone for the keel. Further bone was  removed with an osteotome.  Trial reduction was now carried out with the medium femur, the keeled C tibia, and a 5 lollipop insert. The balance of the  ligaments appeared to be symmetric at 20 degrees and 90 degrees. Given  all these findings, the trial components were removed.   Cement was mixed. The final components were opened. The knee was irrigated with  normal saline solution. Then final debridements of the  soft tissue was carried out, I also drilled the sclerotic bone with a drill.  The final components were cemented with a single batch of cement in a  two-stage technique with the tibial component cemented first. The knee  was then brought  to 45 degrees of flexion with a 5 feeler gauge, held with pressure for a minute and half.  After this the femoral component was cemented in place.  The knee was again held at 45 degrees of flexion while the cement fully cured.  Excess cement was removed throughout the knee. Tourniquet was let down  after 34 minutes. After the cement had fully cured and excessive cement  was removed throughout the knee there was no visualized cement present.   The final size 5 insert was chosen and snapped into position. We re-irrigated  the knee. The extensor mechanism  was then reapproximated using a #1 Vicryl and #0 V-lock suture with the knee in flexion. The  remaining wound was closed with 2-0 Vicryl and a running 4-0 Monocryl.  The knee was cleaned, dried, and dressed sterilely using Dermabond and  Aquacel dressing. The drain site was dressed separately. The patient  was brought to the recovery  room, Ace wrap in place, tolerating the  procedure well. He will be in the hospital for overnight observation.  We will initiate physical therapy and progress to ambulate.     Madlyn Frankel Charlann Boxer, M.D.

## 2012-01-22 NOTE — Anesthesia Procedure Notes (Addendum)
Spinal  Patient location during procedure: OR Start time: 01/22/2012 11:40 AM Staffing Anesthesiologist: Ronelle Nigh L CRNA/Resident: [place], Montie Gelardi C Performed by: resident/CRNA  Preanesthetic Checklist Completed: patient identified, site marked, surgical consent, pre-op evaluation, timeout performed, IV checked, risks and benefits discussed and monitors and equipment checked Spinal Block Patient position: sitting Prep: Betadine and site prepped and draped Patient monitoring: heart rate, continuous pulse ox and blood pressure Approach: midline Location: L3-4 Injection technique: single-shot Needle Needle gauge: 22 G Assessment Sensory level: T6

## 2012-01-22 NOTE — Progress Notes (Signed)
Pt states he took Cipro for UTI and finished taking it in 3 days

## 2012-01-22 NOTE — Anesthesia Preprocedure Evaluation (Addendum)
Anesthesia Evaluation  Patient identified by MRN, date of birth, ID band Patient awake    Reviewed: Allergy & Precautions, H&P , NPO status , Patient's Chart, lab work & pertinent test results  History of Anesthesia Complications (+) PONV  Airway Mallampati: II TM Distance: >3 FB Neck ROM: full    Dental  (+) Caps and Dental Advisory Given,    Pulmonary neg pulmonary ROS,  breath sounds clear to auscultation  Pulmonary exam normal       Cardiovascular hypertension, Pt. on medications + CAD and + Peripheral Vascular Disease + dysrhythmias Atrial Fibrillation + Valvular Problems/Murmurs AS Rhythm:regular Rate:Normal  Multiple angioplasties.  Sick sinus syndrome is listed in Epic but patient doesn't know anything about it and he does have cardiac clearance.ECG - AF.  Mild AS.   Neuro/Psych negative neurological ROS  negative psych ROS   GI/Hepatic negative GI ROS, Neg liver ROS,   Endo/Other  negative endocrine ROS  Renal/GU negative Renal ROS  negative genitourinary   Musculoskeletal   Abdominal   Peds  Hematology negative hematology ROS (+)   Anesthesia Other Findings   Reproductive/Obstetrics negative OB ROS                          Anesthesia Physical Anesthesia Plan  ASA: III  Anesthesia Plan: Spinal   Post-op Pain Management:    Induction:   Airway Management Planned:   Additional Equipment:   Intra-op Plan:   Post-operative Plan:   Informed Consent: I have reviewed the patients History and Physical, chart, labs and discussed the procedure including the risks, benefits and alternatives for the proposed anesthesia with the patient or authorized representative who has indicated his/her understanding and acceptance.   Dental Advisory Given  Plan Discussed with: CRNA and Surgeon  Anesthesia Plan Comments:        Anesthesia Quick Evaluation

## 2012-01-22 NOTE — Interval H&P Note (Signed)
History and Physical Interval Note:  01/22/2012 10:56 AM  Tyler Williams  has presented today for surgery, with the diagnosis of RIGHT KNEE MEDIAL COMPARTMENTAL OA  The various methods of treatment have been discussed with the patient and family. After consideration of risks, benefits and other options for treatment, the patient has consented to  Procedure(s) (LRB) with comments: UNICOMPARTMENTAL KNEE (Right) as a surgical intervention .  The patient's history has been reviewed, patient examined, no change in status, stable for surgery.  I have reviewed the patient's chart and labs.  Questions were answered to the patient's satisfaction.     [name]

## 2012-01-22 NOTE — Anesthesia Postprocedure Evaluation (Signed)
  Anesthesia Post-op Note  Patient: Tyler Williams  Procedure(s) Performed: Procedure(s) (LRB): UNICOMPARTMENTAL KNEE (Right)  Patient Location: PACU  Anesthesia Type: Spinal  Level of Consciousness: awake and alert   Airway and Oxygen Therapy: Patient Spontanous Breathing  Post-op Pain: mild  Post-op Assessment: Post-op Vital signs reviewed, Patient's Cardiovascular Status Stable, Respiratory Function Stable, Patent Airway and No signs of Nausea or vomiting  Last Vitals:  Filed Vitals:   01/22/12 1415  BP: 148/70  Pulse:   Temp:   Resp:     Post-op Vital Signs: stable   Complications: No apparent anesthesia complications

## 2012-01-22 NOTE — Progress Notes (Signed)
Utilization review completed.  

## 2012-01-22 NOTE — Progress Notes (Signed)
ANTICOAGULATION CONSULT NOTE - Initial Consult  Pharmacy Consult for Warfarin Indication: VTE Prophylaxis  No Known Allergies  Patient Measurements:   01/17/12: wt=89.4kg, ht= 175cm  Vital Signs: Temp: 98.3 F (36.8 C) (12/09 1445) BP: 172/93 mmHg (12/09 1445) Pulse Rate: 78  (12/09 1445)  Labs:  Basename 01/22/12 1020  HGB --  HCT --  PLT --  APTT --  LABPROT 13.5  INR 1.04  HEPARINUNFRC --  CREATININE --  CKTOTAL --  CKMB --  TROPONINI --    The CrCl is unknown because both a height and weight (above a minimum accepted value) are required for this calculation.   Medical History: Past Medical History  Diagnosis Date  . CAD (coronary artery disease)     with multiple percutaneous prior coronary  artery interventions  . PAF (paroxysmal atrial fibrillation)   . PVD (peripheral vascular disease)   . Hypercholesteremia   . SSS (sick sinus syndrome)   . Edema     lower extremity-right greater than left  . Hypertension   . GERD (gastroesophageal reflux disease)   . Cancer     hx of skin cancer   . Arthritis     knees   . Pulmonary embolism     1964 related to dislocated hip on right     Medications:  Scheduled:    . atorvastatin  20 mg Oral QPM  . [COMPLETED]  ceFAZolin (ANCEF) IV  2 g Intravenous 60 min Pre-Op  .  ceFAZolin (ANCEF) IV  2 g Intravenous Q6H  . celecoxib  200 mg Oral Q12H  . docusate sodium  100 mg Oral BID  . enoxaparin (LOVENOX) injection  40 mg Subcutaneous Q24H  . ezetimibe  10 mg Oral QPM  . ferrous sulfate  325 mg Oral TID PC  . hydrochlorothiazide  12.5 mg Oral QAC breakfast  . HYDROcodone-acetaminophen  1-2 tablet Oral Q4H  . LORazepam  1 mg Oral Daily  . polyethylene glycol  17 g Oral BID  . verapamil  240 mg Oral QAC breakfast  . [DISCONTINUED] chlorhexidine  60 mL Topical Once   Infusions:    . sodium chloride 0.9 % 1,000 mL with potassium chloride 10 mEq infusion    . [DISCONTINUED] lactated ringers 1,000 mL (01/22/12  1113)  . [DISCONTINUED] lactated ringers      Assessment: 78 yo M s/p right partial knee replacement on 01/22/12. Pt on warfarin prior to admission for hx of Afib, home dose reported as 7.5mg  on Mondays, 5mg  on all other days - last taken 12/3. Current INR wnl (1.04) - subtherapeutic. MD has ordered Lovenox 40mg  q24h to being tomorrow. PO warfarin will begin tonight at 8pm.  Goal of Therapy:  INR 2-3 Monitor platelets by anticoagulation protocol: Yes   Plan:  1) Warfarin 8mg  PO x1 at 20:00 2) Daily PT/INR 3) Pharmacy will f/u daily.  Darrol Angel, PharmD Pager: [phone] 01/22/2012,3:48 PM

## 2012-01-23 ENCOUNTER — Encounter (HOSPITAL_COMMUNITY): Payer: Self-pay | Admitting: Orthopedic Surgery

## 2012-01-23 DIAGNOSIS — E871 Hypo-osmolality and hyponatremia: Secondary | ICD-10-CM

## 2012-01-23 DIAGNOSIS — E663 Overweight: Secondary | ICD-10-CM

## 2012-01-23 LAB — BASIC METABOLIC PANEL
BUN: 17 mg/dL (ref 6–23)
CO2: 22 mEq/L (ref 19–32)
Calcium: 8.4 mg/dL (ref 8.4–10.5)
Chloride: 102 mEq/L (ref 96–112)
Creatinine, Ser: 0.74 mg/dL (ref 0.50–1.35)
GFR calc Af Amer: >90 mL/min (ref >90)
GFR calc non Af Amer: 83 mL/min — ABNORMAL LOW (ref >90)
Glucose, Bld: 155 mg/dL — ABNORMAL HIGH (ref 70–99)
Potassium: 4.4 mEq/L (ref 3.5–5.1)
Sodium: 133 mEq/L — ABNORMAL LOW (ref 135–145)

## 2012-01-23 LAB — CBC
HCT: 45.2 % (ref 39.0–52.0)
Hemoglobin: 15.5 g/dL (ref 13.0–17.0)
MCH: 32.4 pg (ref 26.0–34.0)
MCHC: 34.3 g/dL (ref 30.0–36.0)
MCV: 94.4 fL (ref 78.0–100.0)
Platelets: 141 10*3/uL — ABNORMAL LOW (ref 150–400)
RBC: 4.79 MIL/uL (ref 4.22–5.81)
RDW: 13.4 % (ref 11.5–15.5)
WBC: 14.2 10*3/uL — ABNORMAL HIGH (ref 4.0–10.5)

## 2012-01-23 LAB — PROTIME-INR
INR: 1.1 (ref 0.00–1.49)
Prothrombin Time: 14.1 seconds (ref 11.6–15.2)

## 2012-01-23 MED ORDER — DSS 100 MG PO CAPS: 100.0000 mg | ORAL_CAPSULE | Freq: Two times a day (BID) | ORAL | Status: DC

## 2012-01-23 MED ORDER — ENOXAPARIN (LOVENOX) PATIENT EDUCATION KIT
PACK | Freq: Once | Status: DC
  Filled 2012-01-23: qty 1

## 2012-01-23 MED ORDER — DIPHENHYDRAMINE HCL 25 MG PO CAPS: 25.0000 mg | ORAL_CAPSULE | Freq: Four times a day (QID) | ORAL | Status: DC | PRN

## 2012-01-23 MED ORDER — WARFARIN SODIUM 5 MG PO TABS: 5.0000 mg | ORAL_TABLET | Freq: Every day | ORAL | Status: DC

## 2012-01-23 MED ORDER — METHOCARBAMOL 500 MG PO TABS: 500.0000 mg | ORAL_TABLET | Freq: Four times a day (QID) | ORAL | Status: DC | PRN

## 2012-01-23 MED ORDER — ENOXAPARIN SODIUM 40 MG/0.4ML ~~LOC~~ SOLN: 40.0000 mg | SUBCUTANEOUS | Status: DC

## 2012-01-23 MED ORDER — FERROUS SULFATE 325 (65 FE) MG PO TABS: 325.0000 mg | ORAL_TABLET | Freq: Three times a day (TID) | ORAL | Status: DC

## 2012-01-23 MED ORDER — POLYETHYLENE GLYCOL 3350 17 G PO PACK: 17.0000 g | PACK | Freq: Two times a day (BID) | ORAL | Status: DC

## 2012-01-23 MED ORDER — HYDROCODONE-ACETAMINOPHEN 7.5-325 MG PO TABS: 1.0000 | ORAL_TABLET | ORAL | Status: DC | PRN

## 2012-01-23 NOTE — Evaluation (Signed)
Physical Therapy Evaluation Patient Details Name: Tyler Williams MRN: [id number] DOB: [birth year] Today's Date: 01/23/2012 Time: 9629-5284 PT Time Calculation (min): 37 min  PT Assessment / Plan / Recommendation Clinical Impression  Pt s/p R UKR presents with decreased R LE strength/ROM limiting functional mobility    PT Assessment  Patient needs continued PT services    Follow Up Recommendations  Home health PT    Does the patient have the potential to tolerate intense rehabilitation      Barriers to Discharge None      Equipment Recommendations  None recommended by PT    Recommendations for Other Services     Frequency 7X/week    Precautions / Restrictions Precautions Precautions: Knee Restrictions Weight Bearing Restrictions: No RLE Weight Bearing: Weight bearing as tolerated   Pertinent Vitals/Pain 2/10; premedicated, ice packs provided      Mobility  Bed Mobility Bed Mobility: Supine to Sit Supine to Sit: 5: Supervision Details for Bed Mobility Assistance: min cues for sequence Transfers Transfers: Sit to Stand;Stand to Sit Sit to Stand: 4: Min guard Stand to Sit: 5: Supervision Details for Transfer Assistance: min cues for LE management and use of UEs Ambulation/Gait Ambulation/Gait Assistance: 4: Min guard;5: Supervision Ambulation Distance (Feet): 185 Feet Assistive device: Rolling walker Ambulation/Gait Assistance Details: min cues for position from RW - pt progressing to recip gait Gait Pattern: Step-to pattern;Step-through pattern Stairs: Yes Stairs Assistance: 4: Min assist Stairs Assistance Details (indicate cue type and reason): cues for sequence and cane placement Stair Management Technique: One rail Right;Step to pattern;With cane Number of Stairs: 3  (twice)    Shoulder Instructions     Exercises Total Joint Exercises Ankle Circles/Pumps: AROM;10 reps;Supine;Both Quad Sets: AROM;10 reps;Supine;Both Heel Slides: AAROM;10  reps;Supine;Right Straight Leg Raises: AROM;AAROM;Right;15 reps;Supine   PT Diagnosis: Difficulty walking  PT Problem List: Decreased strength;Decreased range of motion;Decreased activity tolerance;Decreased mobility;Decreased knowledge of use of DME;Pain PT Treatment Interventions: DME instruction;Gait training;Stair training;Functional mobility training;Therapeutic activities;Therapeutic exercise;Patient/family education   PT Goals Acute Rehab PT Goals PT Goal Formulation: With patient Time For Goal Achievement: 01/24/12 Potential to Achieve Goals: Good Pt will go Supine/Side to Sit: with modified independence PT Goal: Supine/Side to Sit - Progress: Goal set today Pt will go Sit to Supine/Side: with supervision PT Goal: Sit to Supine/Side - Progress: Goal set today Pt will go Sit to Stand: with supervision PT Goal: Sit to Stand - Progress: Goal set today Pt will go Stand to Sit: with supervision PT Goal: Stand to Sit - Progress: Goal set today Pt will Ambulate: >150 feet;with supervision;with rolling walker PT Goal: Ambulate - Progress: Goal set today Pt will Go Up / Down Stairs: 3-5 stairs;with supervision;with least restrictive assistive device PT Goal: Up/Down Stairs - Progress: Goal set today  Visit Information  Last PT Received On: 01/23/12 Assistance Needed: +1    Subjective Data  Subjective: I'm having almost no pain Patient Stated Goal: Resume previous lifestyle with decreased pain   Prior Functioning  Home Living Lives With: Spouse Available Help at Discharge: Family Type of Home: House Home Access: Stairs to enter Secretary/administrator of Steps: 3 Entrance Stairs-Rails: Right;Left Home Layout: One level Home Adaptive Equipment: Walker - rolling;Straight cane Prior Function Level of Independence: Independent Able to Take Stairs?: Yes Driving: Yes Vocation: Retired Musician: No difficulties    Cognition  Overall Cognitive Status:  Appears within functional limits for tasks assessed/performed Arousal/Alertness: Awake/alert Orientation Level: Appears intact for tasks assessed Behavior During Session:  WFL for tasks performed    Extremity/Trunk Assessment Right Upper Extremity Assessment RUE ROM/Strength/Tone: [facility] for tasks assessed Left Upper Extremity Assessment LUE ROM/Strength/Tone: WFL for tasks assessed Right Lower Extremity Assessment RLE ROM/Strength/Tone: Deficits RLE ROM/Strength/Tone Deficits: quads 3/5 with AAROM at knee -10 - 100 Left Lower Extremity Assessment LLE ROM/Strength/Tone: WFL for tasks assessed   Balance    End of Session PT - End of Session Activity Tolerance: Patient tolerated treatment well Patient left: in chair;with call bell/phone within reach Nurse Communication: Mobility status  GP     Conrado Nance 01/23/2012, 10:12 AM

## 2012-01-23 NOTE — Progress Notes (Signed)
   Subjective: 1 Day Post-Op Procedure(s) (LRB): UNICOMPARTMENTAL KNEE (Right)   Patient reports pain as mild, pain well controlled. No events throughout the night. Ready to be discharged home after PT.  Objective:   VITALS:   Filed Vitals:   01/23/12 0623  BP: 168/79  Pulse: 74  Temp: 97.9 F (36.6 C)  Resp: 18    Neurovascular intact Dorsiflexion/Plantar flexion intact Incision: dressing C/D/I No cellulitis present Compartment soft  LABS  Basename 01/23/12 0451  HGB 15.5  HCT 45.2  WBC 14.2*  PLT 141*     Basename 01/23/12 0451  NA 133*  K 4.4  BUN 17  CREATININE 0.74  GLUCOSE 155*     Assessment/Plan: 1 Day Post-Op Procedure(s) (LRB): UNICOMPARTMENTAL KNEE (Right) Foley cath d/c'ed Advance diet Up with therapy D/C IV fluids Discharge home with home health  Overweight (BMI 25-29.9)  Estimated Body mass index is 29.09 kg/(m^2) as calculated from the following:   Height as of this encounter: 5\' 9" (1.753 m).   Weight as of this encounter: 197 lb(89.359 kg). Patient also counseled that weight may inhibit the healing process Patient counseled that losing weight will help with future health issues  Hyponatremia Treated with IV fluids and will observe      [name]. Chasya Zenz   PAC  01/23/2012, 7:56 AM

## 2012-01-23 NOTE — Progress Notes (Signed)
OT Note:  Pt screened for OT.  No needs at this time.  [place], OTR/L [phone] 01/23/2012

## 2012-01-23 NOTE — Progress Notes (Signed)
Discharged to [facility] via w/c. Assessment unchanged from am. 

## 2012-01-24 ENCOUNTER — Ambulatory Visit: Payer: Self-pay | Admitting: Internal Medicine

## 2012-01-24 DIAGNOSIS — Z471 Aftercare following joint replacement surgery: Secondary | ICD-10-CM | POA: Diagnosis not present

## 2012-01-24 DIAGNOSIS — I4891 Unspecified atrial fibrillation: Secondary | ICD-10-CM

## 2012-01-24 DIAGNOSIS — I739 Peripheral vascular disease, unspecified: Secondary | ICD-10-CM | POA: Diagnosis not present

## 2012-01-24 DIAGNOSIS — Z96659 Presence of unspecified artificial knee joint: Secondary | ICD-10-CM | POA: Diagnosis not present

## 2012-01-24 DIAGNOSIS — Z5181 Encounter for therapeutic drug level monitoring: Secondary | ICD-10-CM | POA: Diagnosis not present

## 2012-01-24 DIAGNOSIS — I10 Essential (primary) hypertension: Secondary | ICD-10-CM | POA: Diagnosis not present

## 2012-01-24 LAB — POCT INR: INR: 1.5

## 2012-01-26 NOTE — Discharge Summary (Signed)
Physician Discharge Summary  Patient ID: Tyler Williams MRN: [id number] DOB/AGE: June 06, 1926 78 y.o.  Admit date: 01/22/2012 Discharge date: 01/23/2012   Procedures:  Procedure(s) (LRB): UNICOMPARTMENTAL KNEE (Right)  Attending Physician:  Dr. Durene Romans   Admission Diagnoses:   Right knee medial aspect OA / pain  Discharge Diagnoses:  Principal Problem:  *S/P right UKR Active Problems:  Overweight (BMI 25.0-29.9)  Hyponatremia CAD   PAF   PVD   Hypercholesteremia   SSS (sick sinus syndrome)   Edema   Hypertension   GERD   Cancer  Arthritis   Pulmonary embolism   HPI: Tyler Williams, 78 y.o. male, has a history of pain and functional disability in the right knee due to arthritis and has failed non-surgical conservative treatments for greater than 12 weeks to includeNSAID's and/or analgesics, corticosteriod injections, viscosupplementation injections and activity modification. Onset of symptoms was gradual, starting 3 years ago with gradually worsening course since that time. The patient noted no past surgery on the right knee(s). Patient currently rates pain in the right knee(s) at 6 out of 10 with activity. Patient has worsening of pain with activity and weight bearing, pain that interferes with activities of daily living, crepitus and joint swelling. Patient has evidence of periarticular osteophytes and joint space narrowing by imaging studies. There is no active infection. Risks, benefits and expectations were discussed with the patient. Patient understand the risks, benefits and expectations and wishes to proceed with surgery.  PCP: Minda Meo, MD   Discharged Condition: good  Hospital Course:  Patient underwent the above stated procedure on 01/22/2012. Patient tolerated the procedure well and brought to the recovery room in good condition and subsequently to the floor.  POD #1 BP: 168/79 ; Pulse: 74 ; Temp: 97.9 F (36.6 C) ; Resp: 18 Pt's foley was  removed, as well as the hemovac drain removed. IV was changed to a saline lock. Patient reports pain as mild, pain well controlled. No events throughout the night. Ready to be discharged home after PT. Neurovascular intact, dorsiflexion/plantar flexion intact, incision: dressing C/D/I, no cellulitis present and compartment soft.   LABS  Basename  01/23/12 0451  HGB  15.5  HCT  45.2    Discharge Exam: General appearance: alert, cooperative and no distress Extremities: Homans sign is negative, no sign of DVT, no edema, redness or tenderness in the calves or thighs and no ulcers, gangrene or trophic changes  Disposition:   Home-Health Care Svc with follow up in 2 weeks   Follow-up Information    Follow up with [name], MD. Schedule an appointment as soon as possible for a visit in 2 weeks.   Contact information:   [address] [place] [address] [phone]          Discharge Orders    Future Appointments: Provider: Department: Dept Phone: Center:   01/30/2012 9:45 AM [facility] [phone] None   04/22/2012 10:30 AM [facility] SITE 3 ECHO LAB [phone] None   04/30/2012 10:00 AM Herby Abraham, MD [facility] Main Office [place]) [phone] [facility]     Future Orders Please Complete By Expires   Diet - low sodium heart healthy      Call MD / Call 911      Comments:   If you experience chest pain or shortness of breath, CALL 911 and be transported to the hospital emergency room.  If you develope  a fever above 101 F, pus (white drainage) or increased drainage or redness at the wound, or calf pain, call your surgeon's office.   Discharge instructions      Comments:   Maintain surgical dressing for 10-14 days, then replace with gauze and tape. Keep the area dry and clean until follow up. Follow up in 2 weeks at [facility]. Call with any questions  or concerns.   Constipation Prevention      Comments:   Drink plenty of fluids.  Prune juice may be helpful.  You may use a stool softener, such as Colace (over the counter) 100 mg twice a day.  Use MiraLax (over the counter) for constipation as needed.   Increase activity slowly as tolerated      Driving restrictions      Comments:   No driving for 4 weeks   TED hose      Comments:   Use stockings (TED hose) for 2 weeks on both leg(s).  You may remove them at night for sleeping.   Change dressing      Comments:   Maintain surgical dressing for 10-14 days, then change the dressing daily with sterile 4 x 4 inch gauze dressing and tape. Keep the area dry and clean.      Discharge Medication List as of 01/23/2012  8:17 AM    START taking these medications   Details  diphenhydrAMINE (BENADRYL) 25 mg capsule Take 1 capsule (25 mg total) by mouth every 6 (six) hours as needed for itching, allergies or sleep., Starting 01/23/2012, Until Discontinued, No Print    docusate sodium 100 MG CAPS Take 100 mg by mouth 2 (two) times daily., Starting 01/23/2012, Until Discontinued, No Print    enoxaparin (LOVENOX) 40 MG/0.4ML injection Inject 0.4 mLs (40 mg total) into the skin daily., Starting 01/23/2012, Until Discontinued, Print    ferrous sulfate 325 (65 FE) MG tablet Take 1 tablet (325 mg total) by mouth 3 (three) times daily after meals., Starting 01/23/2012, Until Discontinued, No Print    HYDROcodone-acetaminophen (NORCO) 7.5-325 MG per tablet Take 1-2 tablets by mouth every 4 (four) hours as needed for pain., Starting 01/23/2012, Until Discontinued, Print    methocarbamol (ROBAXIN) 500 MG tablet Take 1 tablet (500 mg total) by mouth every 6 (six) hours as needed (muscle spasms)., Starting 01/23/2012, Until Discontinued, Print    polyethylene glycol (MIRALAX / GLYCOLAX) packet Take 17 g by mouth 2 (two) times daily., Starting 01/23/2012, Until Discontinued, No Print      CONTINUE these  medications which have CHANGED   Details  warfarin (COUMADIN) 5 MG tablet Take 1 tablet (5 mg total) by mouth daily. TAKES 1 TABLET DAILY EXCEPT ON Monday HE TAKES 1 AND 1/2 TABLET TO EQUAL 7.5 MG, Starting 01/23/2012, Until Discontinued, Print      CONTINUE these medications which have NOT CHANGED   Details  atorvastatin (LIPITOR) 20 MG tablet Take 20 mg by mouth every evening., Until Discontinued, Historical Med    Cholecalciferol (VITAMIN D-3) 5000 UNITS TABS Take 5,000 Units by mouth daily. , Until Discontinued, Historical Med    Coenzyme Q10 (CO Q 10) 100 MG CAPS Take 1 capsule by mouth 2 (two) times daily., Until Discontinued, Historical Med    ezetimibe (ZETIA) 10 MG tablet Take 10 mg by mouth every evening., Until Discontinued, Historical Med    guaifenesin (ROBITUSSIN) 100 MG/5ML syrup Take 200 mg by mouth 3 (three) times daily as needed. CHEST CONGESTION, Until Discontinued, Historical  Med    hydrochlorothiazide (MICROZIDE) 12.5 MG capsule Take 12.5 mg by mouth daily before breakfast., Until Discontinued, Historical Med    LORazepam (ATIVAN) 1 MG tablet Take 1 mg by mouth daily. , Until Discontinued, Historical Med    Multiple Vitamin (MULTIVITAMIN) tablet Take 1 tablet by mouth daily., Until Discontinued, Historical Med    Multiple Vitamins-Minerals (PRESERVISION/LUTEIN PO) Take 1 tablet by mouth 2 (two) times daily. , Until Discontinued, Historical Med    Omega-3 Fatty Acids (FISH OIL) 300 MG CAPS Take 300 mg by mouth 2 (two) times daily. , Until Discontinued, Historical Med    Psyllium (METAMUCIL) 30.9 % POWD Take by mouth as needed. CONSTIPATION, Until Discontinued, Historical Med    ramipril (ALTACE) 10 MG capsule Take 1 capsule (10 mg total) by mouth 2 (two) times daily., Starting 08/24/2011, Until Discontinued, Normal    sodium chloride (OCEAN) 0.65 % nasal spray Place 1 spray into the nose as needed. DRY NARES, Until Discontinued, Historical Med    verapamil  (CALAN-SR) 240 MG CR tablet Take 240 mg by mouth daily before breakfast., Until Discontinued, Historical Med    Ranibizumab (LUCENTIS) 0.5 MG/0.05ML SOLN Inject 0.5 mg into the eye See admin instructions. Pt gets this injection every 9 weeks. Next treatment is due on 01-18-12. Pt is followed by [facility]., Until Discontinued, Historical Med      STOP taking these medications     Ibuprofen (MOTRIN PO) Comments:  Reason for Stopping:           Signed: [name]. Sage Kopera   PAC  01/26/2012, 12:26 PM

## 2012-01-29 ENCOUNTER — Telehealth: Payer: Self-pay | Admitting: *Deleted

## 2012-01-29 ENCOUNTER — Ambulatory Visit: Payer: Self-pay | Admitting: Internal Medicine

## 2012-01-29 DIAGNOSIS — I4891 Unspecified atrial fibrillation: Secondary | ICD-10-CM

## 2012-01-29 LAB — POCT INR: INR: 2.1

## 2012-01-29 NOTE — Telephone Encounter (Signed)
[name] called to report INR-INR was 2.1 and pt is currently on Coumadin 5mg  daily.

## 2012-01-30 NOTE — Telephone Encounter (Signed)
This message has been addressed by the anticoagulation clinic.

## 2012-02-12 ENCOUNTER — Ambulatory Visit: Payer: Self-pay | Admitting: Internal Medicine

## 2012-02-12 DIAGNOSIS — I4891 Unspecified atrial fibrillation: Secondary | ICD-10-CM

## 2012-02-12 LAB — POCT INR: INR: 2.5

## 2012-02-15 DIAGNOSIS — M25569 Pain in unspecified knee: Secondary | ICD-10-CM | POA: Diagnosis not present

## 2012-02-16 ENCOUNTER — Other Ambulatory Visit: Payer: Self-pay | Admitting: Cardiology

## 2012-02-16 MED ORDER — RAMIPRIL 10 MG PO CAPS: 10.0000 mg | ORAL_CAPSULE | Freq: Two times a day (BID) | ORAL | Status: DC

## 2012-02-21 DIAGNOSIS — IMO0002 Reserved for concepts with insufficient information to code with codable children: Secondary | ICD-10-CM | POA: Diagnosis not present

## 2012-02-21 DIAGNOSIS — M1710 Unilateral primary osteoarthritis, unspecified knee: Secondary | ICD-10-CM | POA: Diagnosis not present

## 2012-02-23 DIAGNOSIS — IMO0002 Reserved for concepts with insufficient information to code with codable children: Secondary | ICD-10-CM | POA: Diagnosis not present

## 2012-02-23 DIAGNOSIS — M1710 Unilateral primary osteoarthritis, unspecified knee: Secondary | ICD-10-CM | POA: Diagnosis not present

## 2012-02-27 DIAGNOSIS — M25569 Pain in unspecified knee: Secondary | ICD-10-CM | POA: Diagnosis not present

## 2012-02-29 DIAGNOSIS — Z961 Presence of intraocular lens: Secondary | ICD-10-CM | POA: Diagnosis not present

## 2012-02-29 DIAGNOSIS — H353290 Exudative age-related macular degeneration, unspecified eye, stage unspecified: Secondary | ICD-10-CM | POA: Diagnosis not present

## 2012-03-01 DIAGNOSIS — M25569 Pain in unspecified knee: Secondary | ICD-10-CM | POA: Diagnosis not present

## 2012-03-04 ENCOUNTER — Ambulatory Visit (INDEPENDENT_AMBULATORY_CARE_PROVIDER_SITE_OTHER): Payer: Medicare Other | Admitting: *Deleted

## 2012-03-04 DIAGNOSIS — I4891 Unspecified atrial fibrillation: Secondary | ICD-10-CM | POA: Diagnosis not present

## 2012-03-04 LAB — POCT INR: INR: 2.2

## 2012-03-06 DIAGNOSIS — M25569 Pain in unspecified knee: Secondary | ICD-10-CM | POA: Diagnosis not present

## 2012-03-07 DIAGNOSIS — H353290 Exudative age-related macular degeneration, unspecified eye, stage unspecified: Secondary | ICD-10-CM | POA: Diagnosis not present

## 2012-03-08 DIAGNOSIS — M25569 Pain in unspecified knee: Secondary | ICD-10-CM | POA: Diagnosis not present

## 2012-03-13 DIAGNOSIS — Z96659 Presence of unspecified artificial knee joint: Secondary | ICD-10-CM | POA: Diagnosis not present

## 2012-03-15 ENCOUNTER — Other Ambulatory Visit: Payer: Self-pay

## 2012-03-15 MED ORDER — WARFARIN SODIUM 5 MG PO TABS: ORAL_TABLET | ORAL | Status: DC

## 2012-04-01 ENCOUNTER — Ambulatory Visit (INDEPENDENT_AMBULATORY_CARE_PROVIDER_SITE_OTHER): Payer: Medicare Other | Admitting: *Deleted

## 2012-04-01 DIAGNOSIS — I4891 Unspecified atrial fibrillation: Secondary | ICD-10-CM | POA: Diagnosis not present

## 2012-04-01 LAB — POCT INR: INR: 2.1

## 2012-04-11 DIAGNOSIS — H353290 Exudative age-related macular degeneration, unspecified eye, stage unspecified: Secondary | ICD-10-CM | POA: Diagnosis not present

## 2012-04-19 ENCOUNTER — Encounter (INDEPENDENT_AMBULATORY_CARE_PROVIDER_SITE_OTHER): Payer: Medicare Other | Admitting: Ophthalmology

## 2012-04-22 ENCOUNTER — Ambulatory Visit (HOSPITAL_COMMUNITY): Payer: Medicare Other | Attending: Internal Medicine | Admitting: Radiology

## 2012-04-22 DIAGNOSIS — I2510 Atherosclerotic heart disease of native coronary artery without angina pectoris: Secondary | ICD-10-CM | POA: Diagnosis not present

## 2012-04-22 DIAGNOSIS — E7800 Pure hypercholesterolemia, unspecified: Secondary | ICD-10-CM

## 2012-04-22 DIAGNOSIS — R0989 Other specified symptoms and signs involving the circulatory and respiratory systems: Secondary | ICD-10-CM | POA: Diagnosis not present

## 2012-04-22 DIAGNOSIS — I350 Nonrheumatic aortic (valve) stenosis: Secondary | ICD-10-CM

## 2012-04-22 DIAGNOSIS — I4891 Unspecified atrial fibrillation: Secondary | ICD-10-CM

## 2012-04-22 DIAGNOSIS — I359 Nonrheumatic aortic valve disorder, unspecified: Secondary | ICD-10-CM | POA: Diagnosis not present

## 2012-04-22 NOTE — Progress Notes (Signed)
Echocardiogram performed.  

## 2012-04-23 DIAGNOSIS — Z961 Presence of intraocular lens: Secondary | ICD-10-CM | POA: Diagnosis not present

## 2012-04-23 DIAGNOSIS — H353190 Nonexudative age-related macular degeneration, unspecified eye, stage unspecified: Secondary | ICD-10-CM | POA: Diagnosis not present

## 2012-04-23 DIAGNOSIS — H353290 Exudative age-related macular degeneration, unspecified eye, stage unspecified: Secondary | ICD-10-CM | POA: Diagnosis not present

## 2012-04-25 DIAGNOSIS — H353290 Exudative age-related macular degeneration, unspecified eye, stage unspecified: Secondary | ICD-10-CM | POA: Diagnosis not present

## 2012-04-26 DIAGNOSIS — IMO0002 Reserved for concepts with insufficient information to code with codable children: Secondary | ICD-10-CM | POA: Diagnosis not present

## 2012-04-26 DIAGNOSIS — M1710 Unilateral primary osteoarthritis, unspecified knee: Secondary | ICD-10-CM | POA: Diagnosis not present

## 2012-04-30 ENCOUNTER — Encounter: Payer: Self-pay | Admitting: Cardiology

## 2012-04-30 ENCOUNTER — Ambulatory Visit (INDEPENDENT_AMBULATORY_CARE_PROVIDER_SITE_OTHER): Payer: Medicare Other | Admitting: Cardiology

## 2012-04-30 VITALS — BP 152/78 | HR 77 | Ht 69.5 in | Wt 194.0 lb

## 2012-04-30 DIAGNOSIS — I350 Nonrheumatic aortic (valve) stenosis: Secondary | ICD-10-CM

## 2012-04-30 DIAGNOSIS — I4891 Unspecified atrial fibrillation: Secondary | ICD-10-CM

## 2012-04-30 DIAGNOSIS — E7800 Pure hypercholesterolemia, unspecified: Secondary | ICD-10-CM | POA: Diagnosis not present

## 2012-04-30 DIAGNOSIS — I2510 Atherosclerotic heart disease of native coronary artery without angina pectoris: Secondary | ICD-10-CM

## 2012-04-30 DIAGNOSIS — I10 Essential (primary) hypertension: Secondary | ICD-10-CM

## 2012-04-30 DIAGNOSIS — R0989 Other specified symptoms and signs involving the circulatory and respiratory systems: Secondary | ICD-10-CM

## 2012-04-30 DIAGNOSIS — I359 Nonrheumatic aortic valve disorder, unspecified: Secondary | ICD-10-CM | POA: Diagnosis not present

## 2012-04-30 NOTE — Assessment & Plan Note (Signed)
NO high grade obstruction.  See most recent carotid studies.

## 2012-04-30 NOTE — Progress Notes (Signed)
HPI:  This very nice patient returns for followup. I have been following him since 1986.  He's had remote percutaneous coronary intervention, and this was done on 2 separate occasions. He's not had repeat intervention nearly 20 years. He also has aortic stenosis, peripheral vascular disease, prior aortobifemoral bypass graft, and chronic atrial fibrillation. He recently had knee surgery, and he is back to walking 2-3 miles per day without any symptoms whatsoever. His primary concern is that of macular degeneration. He's been able to get his lipids done in Dr. Lanell Matar office.  Current Outpatient Prescriptions  Medication Sig Dispense Refill  . atorvastatin (LIPITOR) 20 MG tablet Take 20 mg by mouth every evening.      . Cholecalciferol (VITAMIN D-3) 5000 UNITS TABS Take 5,000 Units by mouth daily.       . Coenzyme Q10 (CO Q 10) 100 MG CAPS Take 1 capsule by mouth 2 (two) times daily.      Marland Kitchen ezetimibe (ZETIA) 10 MG tablet Take 10 mg by mouth every evening.      . hydrochlorothiazide (MICROZIDE) 12.5 MG capsule Take 12.5 mg by mouth daily before breakfast.      . LORazepam (ATIVAN) 1 MG tablet Take 1 mg by mouth every 8 (eight) hours.      . methocarbamol (ROBAXIN) 500 MG tablet Take 1 tablet (500 mg total) by mouth every 6 (six) hours as needed (muscle spasms).  50 tablet  0  . Omega-3 Fatty Acids (FISH OIL) 300 MG CAPS Take 300 mg by mouth 2 (two) times daily.       . polyethylene glycol (MIRALAX / GLYCOLAX) packet Take 17 g by mouth 2 (two) times daily.  14 each    . Psyllium (METAMUCIL) 30.9 % POWD Take by mouth as needed. CONSTIPATION      . ramipril (ALTACE) 10 MG capsule Take 1 capsule (10 mg total) by mouth 2 (two) times daily.  60 capsule  5  . Ranibizumab (LUCENTIS) 0.5 MG/0.05ML SOLN Inject 0.5 mg into the eye See admin instructions. Pt gets this injection every 9 weeks. Next treatment is due on 01-18-12. Pt is followed by [facility].      . sodium chloride (OCEAN) 0.65 %  nasal spray Place 1 spray into the nose as needed. DRY NARES      . verapamil (CALAN-SR) 240 MG CR tablet Take 240 mg by mouth daily before breakfast.      . warfarin (COUMADIN) 5 MG tablet Take as directed by anticoagulation clinic  40 tablet  3   No current facility-administered medications for this visit.    No Known Allergies  Past Medical History  Diagnosis Date  . CAD (coronary artery disease)     with multiple percutaneous prior coronary  artery interventions  . PAF (paroxysmal atrial fibrillation)   . PVD (peripheral vascular disease)   . Hypercholesteremia   . SSS (sick sinus syndrome)   . Edema     lower extremity-right greater than left  . Hypertension   . GERD (gastroesophageal reflux disease)   . Cancer     hx of skin cancer   . Arthritis     knees   . Pulmonary embolism     1964 related to dislocated hip on right     Past Surgical History  Procedure Laterality Date  . Angioplasty    . Orthopedic surgery    . Coronary angioplasty    . Hernia repair      right  inguinal hernia repair   . Abdominal aortic aneurysm repair      1993   . Other surgical history      right knee popliteal aneurysm surgery stent placed   . Tonsillectomy    . Other surgical history    . Hemorrhoid surgery      1973  . Eye surgery      hx of cataract surgery   . Joint replacement      left knee replacement  . Partial knee arthroplasty  01/22/2012    Procedure: UNICOMPARTMENTAL KNEE;  Surgeon: [name], MD;  Location: [facility]S;  Service: Orthopedics;  Laterality: Right;    No family history on file.  History   Social History  . Marital Status: Married    Spouse Name: N/A    Number of Children: N/A  . Years of Education: N/A   Occupational History  . Not on file.   Social History Main Topics  . Smoking status: Former Smoker    Types: Cigarettes    Quit date: 02/14/1984  . Smokeless tobacco: Never Used  . Alcohol Use: 4.2 oz/week    7 Glasses of wine per week      Comment: 5 ounces of wine daily   . Drug Use: No  . Sexually Active: Not on file   Other Topics Concern  . Not on file   Social History Narrative  . No narrative on file    ROS: Please see the HPI.  All other systems reviewed and negative.  PHYSICAL EXAM:  BP 152/78  Pulse 77  Ht 5' 9.5" (1.765 m)  Wt 194 lb (87.998 kg)  BMI 28.25 kg/m2  SpO2 98%  General: Well developed, well nourished, in no acute distress. Head:  Normocephalic and atraumatic. Neck: no JVD Lungs: Clear to auscultation and percussion. Heart: Irregularly irregular rhythm.  2-3/6 SEM.  No DM.   Abdomen:  Normal bowel sounds; soft; non tender; no organomegaly.  Incision from ABFBPG surgery.   Pulses: Pulses normal in all 4 extremities.  Demonstrated to patient.   Extremities: No clubbing or cyanosis. No edema. Neurologic: Alert and oriented x 3.  EKG:  Atrial fib with controlled ventricular response.  RBBB.  LPFB.  No acute changes.  No real change from 10/13.    ASSESSMENT AND PLAN:  1.  Will arrange fu with Dr. Excell Seltzer.  He will likely need repeat echo down the road for evolving AS.

## 2012-04-30 NOTE — Patient Instructions (Addendum)
Your physician wants you to follow-up in: 6 months with Dr. Cooper.  You will receive a reminder letter in the mail two months in advance. If you don't receive a letter, please call our office to schedule the follow-up appointment.  Your physician recommends that you continue on your current medications as directed. Please refer to the Current Medication list given to you today.  

## 2012-04-30 NOTE — Assessment & Plan Note (Signed)
Rate is well controlled on long term verapamil.  Notably, he was told by anesthesia he needed a pacemaker.  However, he is not that slow to my knowledge, nor is he symptomatic.  Would continue to watch.

## 2012-04-30 NOTE — Assessment & Plan Note (Signed)
Followed by Dr. Aronson. 

## 2012-04-30 NOTE — Assessment & Plan Note (Signed)
There is mild increase in his transvalvular gradient.  He is asymptomatic.  Will arrange long term follow up with Dr. Excell Seltzer.

## 2012-04-30 NOTE — Assessment & Plan Note (Signed)
No recurrent symptoms at this point.  Two remote PCIs

## 2012-04-30 NOTE — Assessment & Plan Note (Signed)
Modestly elevated but he watches at home

## 2012-05-02 ENCOUNTER — Encounter (INDEPENDENT_AMBULATORY_CARE_PROVIDER_SITE_OTHER): Payer: Medicare Other | Admitting: Ophthalmology

## 2012-05-07 DIAGNOSIS — H353290 Exudative age-related macular degeneration, unspecified eye, stage unspecified: Secondary | ICD-10-CM | POA: Diagnosis not present

## 2012-05-07 DIAGNOSIS — H353190 Nonexudative age-related macular degeneration, unspecified eye, stage unspecified: Secondary | ICD-10-CM | POA: Diagnosis not present

## 2012-05-07 DIAGNOSIS — Z961 Presence of intraocular lens: Secondary | ICD-10-CM | POA: Diagnosis not present

## 2012-05-13 ENCOUNTER — Ambulatory Visit (INDEPENDENT_AMBULATORY_CARE_PROVIDER_SITE_OTHER): Payer: Medicare Other | Admitting: Pharmacist

## 2012-05-13 DIAGNOSIS — I4891 Unspecified atrial fibrillation: Secondary | ICD-10-CM

## 2012-05-13 LAB — POCT INR: INR: 2.7

## 2012-05-14 DIAGNOSIS — H35359 Cystoid macular degeneration, unspecified eye: Secondary | ICD-10-CM | POA: Insufficient documentation

## 2012-05-14 DIAGNOSIS — H353290 Exudative age-related macular degeneration, unspecified eye, stage unspecified: Secondary | ICD-10-CM | POA: Diagnosis not present

## 2012-05-14 DIAGNOSIS — Z961 Presence of intraocular lens: Secondary | ICD-10-CM | POA: Diagnosis not present

## 2012-05-14 DIAGNOSIS — H353190 Nonexudative age-related macular degeneration, unspecified eye, stage unspecified: Secondary | ICD-10-CM | POA: Diagnosis not present

## 2012-05-14 DIAGNOSIS — H35059 Retinal neovascularization, unspecified, unspecified eye: Secondary | ICD-10-CM | POA: Diagnosis not present

## 2012-05-21 DIAGNOSIS — H35359 Cystoid macular degeneration, unspecified eye: Secondary | ICD-10-CM | POA: Diagnosis not present

## 2012-05-21 DIAGNOSIS — Z961 Presence of intraocular lens: Secondary | ICD-10-CM | POA: Diagnosis not present

## 2012-05-21 DIAGNOSIS — H35059 Retinal neovascularization, unspecified, unspecified eye: Secondary | ICD-10-CM | POA: Diagnosis not present

## 2012-05-21 DIAGNOSIS — H353290 Exudative age-related macular degeneration, unspecified eye, stage unspecified: Secondary | ICD-10-CM | POA: Diagnosis not present

## 2012-05-22 ENCOUNTER — Emergency Department (HOSPITAL_COMMUNITY): Payer: Medicare Other

## 2012-05-22 ENCOUNTER — Inpatient Hospital Stay (HOSPITAL_COMMUNITY)
Admission: EM | Admit: 2012-05-22 | Discharge: 2012-05-25 | DRG: 287 | Disposition: A | Discharge status: Home / Self Care | Payer: Medicare Other | Attending: Cardiology | Admitting: Cardiology

## 2012-05-22 ENCOUNTER — Encounter (HOSPITAL_COMMUNITY): Payer: Self-pay | Admitting: *Deleted

## 2012-05-22 DIAGNOSIS — K219 Gastro-esophageal reflux disease without esophagitis: Secondary | ICD-10-CM | POA: Diagnosis present

## 2012-05-22 DIAGNOSIS — E785 Hyperlipidemia, unspecified: Secondary | ICD-10-CM | POA: Diagnosis present

## 2012-05-22 DIAGNOSIS — D696 Thrombocytopenia, unspecified: Secondary | ICD-10-CM | POA: Diagnosis present

## 2012-05-22 DIAGNOSIS — Z87891 Personal history of nicotine dependence: Secondary | ICD-10-CM | POA: Diagnosis not present

## 2012-05-22 DIAGNOSIS — Z7901 Long term (current) use of anticoagulants: Secondary | ICD-10-CM

## 2012-05-22 DIAGNOSIS — R0989 Other specified symptoms and signs involving the circulatory and respiratory systems: Secondary | ICD-10-CM

## 2012-05-22 DIAGNOSIS — I5030 Unspecified diastolic (congestive) heart failure: Secondary | ICD-10-CM | POA: Diagnosis present

## 2012-05-22 DIAGNOSIS — I200 Unstable angina: Secondary | ICD-10-CM

## 2012-05-22 DIAGNOSIS — E7800 Pure hypercholesterolemia, unspecified: Secondary | ICD-10-CM

## 2012-05-22 DIAGNOSIS — E871 Hypo-osmolality and hyponatremia: Secondary | ICD-10-CM

## 2012-05-22 DIAGNOSIS — I350 Nonrheumatic aortic (valve) stenosis: Secondary | ICD-10-CM

## 2012-05-22 DIAGNOSIS — I739 Peripheral vascular disease, unspecified: Secondary | ICD-10-CM | POA: Diagnosis present

## 2012-05-22 DIAGNOSIS — R079 Chest pain, unspecified: Secondary | ICD-10-CM | POA: Diagnosis not present

## 2012-05-22 DIAGNOSIS — R609 Edema, unspecified: Secondary | ICD-10-CM

## 2012-05-22 DIAGNOSIS — Z96659 Presence of unspecified artificial knee joint: Secondary | ICD-10-CM

## 2012-05-22 DIAGNOSIS — I4891 Unspecified atrial fibrillation: Secondary | ICD-10-CM

## 2012-05-22 DIAGNOSIS — H3530 Unspecified macular degeneration: Secondary | ICD-10-CM | POA: Diagnosis present

## 2012-05-22 DIAGNOSIS — I359 Nonrheumatic aortic valve disorder, unspecified: Secondary | ICD-10-CM

## 2012-05-22 DIAGNOSIS — I2510 Atherosclerotic heart disease of native coronary artery without angina pectoris: Principal | ICD-10-CM

## 2012-05-22 DIAGNOSIS — I2584 Coronary atherosclerosis due to calcified coronary lesion: Secondary | ICD-10-CM | POA: Diagnosis present

## 2012-05-22 DIAGNOSIS — Z86711 Personal history of pulmonary embolism: Secondary | ICD-10-CM

## 2012-05-22 DIAGNOSIS — Z96651 Presence of right artificial knee joint: Secondary | ICD-10-CM

## 2012-05-22 DIAGNOSIS — I509 Heart failure, unspecified: Secondary | ICD-10-CM | POA: Diagnosis present

## 2012-05-22 DIAGNOSIS — I10 Essential (primary) hypertension: Secondary | ICD-10-CM

## 2012-05-22 DIAGNOSIS — E663 Overweight: Secondary | ICD-10-CM

## 2012-05-22 LAB — TROPONIN I: Troponin I: <0.3 ng/mL (ref <0.30)

## 2012-05-22 LAB — BASIC METABOLIC PANEL
BUN: 22 mg/dL (ref 6–23)
CO2: 24 mEq/L (ref 19–32)
Calcium: 9.4 mg/dL (ref 8.4–10.5)
Chloride: 100 mEq/L (ref 96–112)
Creatinine, Ser: 0.92 mg/dL (ref 0.50–1.35)
GFR calc Af Amer: 87 mL/min — ABNORMAL LOW (ref >90)
GFR calc non Af Amer: 75 mL/min — ABNORMAL LOW (ref >90)
Glucose, Bld: 126 mg/dL — ABNORMAL HIGH (ref 70–99)
Potassium: 4.3 mEq/L (ref 3.5–5.1)
Sodium: 135 mEq/L (ref 135–145)

## 2012-05-22 LAB — CBC
HCT: 48.6 % (ref 39.0–52.0)
Hemoglobin: 16.7 g/dL (ref 13.0–17.0)
MCH: 31.7 pg (ref 26.0–34.0)
MCHC: 34.4 g/dL (ref 30.0–36.0)
MCV: 92.4 fL (ref 78.0–100.0)
Platelets: 143 10*3/uL — ABNORMAL LOW (ref 150–400)
RBC: 5.26 MIL/uL (ref 4.22–5.81)
RDW: 14.1 % (ref 11.5–15.5)
WBC: 8.4 10*3/uL (ref 4.0–10.5)

## 2012-05-22 LAB — MRSA PCR SCREENING: MRSA by PCR: NEGATIVE

## 2012-05-22 LAB — POCT I-STAT TROPONIN I: Troponin i, poc: 0.03 ng/mL (ref 0.00–0.08)

## 2012-05-22 LAB — PROTIME-INR
INR: 2.15 — ABNORMAL HIGH (ref 0.00–1.49)
Prothrombin Time: 23.1 seconds — ABNORMAL HIGH (ref 11.6–15.2)

## 2012-05-22 LAB — PRO B NATRIURETIC PEPTIDE
Pro B Natriuretic peptide (BNP): 49.9 pg/mL (ref 0–450)
Pro B Natriuretic peptide (BNP): 981.1 pg/mL — ABNORMAL HIGH (ref 0–450)

## 2012-05-22 MED ORDER — METOPROLOL SUCCINATE ER 50 MG PO TB24
50.0000 mg | ORAL_TABLET | Freq: Two times a day (BID) | ORAL | Status: DC
  Administered 2012-05-22 – 2012-05-25 (×6): 50 mg via ORAL
  Filled 2012-05-22 (×9): qty 1

## 2012-05-22 MED ORDER — ASPIRIN EC 81 MG PO TBEC
81.0000 mg | DELAYED_RELEASE_TABLET | Freq: Every day | ORAL | Status: DC
  Administered 2012-05-23: 81 mg via ORAL
  Filled 2012-05-22 (×2): qty 1

## 2012-05-22 MED ORDER — SODIUM CHLORIDE 0.9 % IJ SOLN
3.0000 mL | Freq: Two times a day (BID) | INTRAMUSCULAR | Status: DC
  Administered 2012-05-22 – 2012-05-23 (×3): 3 mL via INTRAVENOUS

## 2012-05-22 MED ORDER — EZETIMIBE 10 MG PO TABS
10.0000 mg | ORAL_TABLET | Freq: Every day | ORAL | Status: DC
  Administered 2012-05-22 – 2012-05-24 (×3): 10 mg via ORAL
  Filled 2012-05-22 (×5): qty 1

## 2012-05-22 MED ORDER — HYDROCHLOROTHIAZIDE 12.5 MG PO CAPS
12.5000 mg | ORAL_CAPSULE | Freq: Every day | ORAL | Status: DC
  Administered 2012-05-23 – 2012-05-25 (×3): 12.5 mg via ORAL
  Filled 2012-05-22 (×4): qty 1

## 2012-05-22 MED ORDER — PSYLLIUM 95 % PO PACK
1.0000 | PACK | Freq: Every day | ORAL | Status: DC
  Administered 2012-05-22 – 2012-05-25 (×3): 1 via ORAL
  Filled 2012-05-22 (×5): qty 1

## 2012-05-22 MED ORDER — RAMIPRIL 10 MG PO CAPS
10.0000 mg | ORAL_CAPSULE | Freq: Two times a day (BID) | ORAL | Status: DC
  Administered 2012-05-22 – 2012-05-25 (×6): 10 mg via ORAL
  Filled 2012-05-22 (×9): qty 1

## 2012-05-22 MED ORDER — SODIUM CHLORIDE 0.9 % IV SOLN
250.0000 mL | INTRAVENOUS | Status: DC | PRN
  Administered 2012-05-23: 500 mL via INTRAVENOUS

## 2012-05-22 MED ORDER — ATORVASTATIN CALCIUM 20 MG PO TABS
20.0000 mg | ORAL_TABLET | Freq: Every day | ORAL | Status: DC
  Administered 2012-05-22 – 2012-05-24 (×3): 20 mg via ORAL
  Filled 2012-05-22 (×5): qty 1

## 2012-05-22 MED ORDER — ACETAMINOPHEN 325 MG PO TABS: 650.0000 mg | ORAL_TABLET | ORAL | Status: DC | PRN

## 2012-05-22 MED ORDER — LORAZEPAM 0.5 MG PO TABS
0.5000 mg | ORAL_TABLET | Freq: Three times a day (TID) | ORAL | Status: DC | PRN
  Administered 2012-05-22 – 2012-05-23 (×2): 1 mg via ORAL
  Administered 2012-05-24: 0.5 mg via ORAL
  Filled 2012-05-22 (×3): qty 2

## 2012-05-22 MED ORDER — OMEGA-3-ACID ETHYL ESTERS 1 G PO CAPS
1.0000 g | ORAL_CAPSULE | Freq: Every day | ORAL | Status: DC
  Administered 2012-05-23 – 2012-05-25 (×2): 1 g via ORAL
  Filled 2012-05-22 (×3): qty 1

## 2012-05-22 MED ORDER — NITROGLYCERIN 0.4 MG SL SUBL: 0.4000 mg | SUBLINGUAL_TABLET | SUBLINGUAL | Status: DC | PRN

## 2012-05-22 MED ORDER — ONDANSETRON HCL 4 MG/2ML IJ SOLN: 4.0000 mg | Freq: Four times a day (QID) | INTRAMUSCULAR | Status: DC | PRN

## 2012-05-22 MED ORDER — NITROGLYCERIN 2 % TD OINT
1.0000 [in_us] | TOPICAL_OINTMENT | Freq: Four times a day (QID) | TRANSDERMAL | Status: DC
  Administered 2012-05-22 – 2012-05-24 (×7): 1 [in_us] via TOPICAL
  Filled 2012-05-22: qty 30

## 2012-05-22 MED ORDER — SODIUM CHLORIDE 0.9 % IJ SOLN: 3.0000 mL | INTRAMUSCULAR | Status: DC | PRN

## 2012-05-22 MED ORDER — WARFARIN SODIUM 5 MG PO TABS: 5.0000 mg | ORAL_TABLET | Freq: Every day | ORAL | Status: DC

## 2012-05-22 NOTE — Progress Notes (Signed)
ANTICOAGULATION CONSULT NOTE - Initial Consult  Pharmacy Consult for Heparin Indication: R/O ACS/STEMI On chronic Warfarin for Afib  No Known Allergies  Patient Measurements: Height: 5' 9.5" (176.5 cm) Weight: 194 lb 0.1 oz (88 kg) IBW/kg (Calculated) : 71.85  Vital Signs: Temp: 98.1 F (36.7 C) (04/09 1444) Temp src: Oral (04/09 1444) BP: 149/82 mmHg (04/09 1800) Pulse Rate: 57 (04/09 1800)  Labs:  Recent Labs  05/22/12 1446 05/22/12 1810  HGB 16.7  --   HCT 48.6  --   PLT 143*  --   LABPROT  --  23.1*  INR  --  2.15*  CREATININE 0.92  --    Estimated Creatinine Clearance: 66.2 ml/min (by C-G formula based on Cr of 0.92).  Medical History: Past Medical History  Diagnosis Date  . CAD (coronary artery disease)     with multiple percutaneous prior coronary  artery interventions  . PAF (paroxysmal atrial fibrillation)   . PVD (peripheral vascular disease)   . Hypercholesteremia   . SSS (sick sinus syndrome)   . Edema     lower extremity-right greater than left  . Hypertension   . GERD (gastroesophageal reflux disease)   . Cancer     hx of skin cancer   . Arthritis     knees   . Pulmonary embolism     1964 related to dislocated hip on right    Medications:  Scheduled:  . [START ON 05/23/2012] aspirin EC  81 mg Oral Daily  . atorvastatin  20 mg Oral q1800  . ezetimibe  10 mg Oral q1800  . [START ON 05/23/2012] hydrochlorothiazide  12.5 mg Oral Daily  . metoprolol succinate  50 mg Oral BID  . nitroGLYCERIN  1 inch Topical Q6H  . [START ON 05/23/2012] omega-3 acid ethyl esters  1 g Oral Daily  . psyllium  1 packet Oral Daily  . ramipril  10 mg Oral BID  . sodium chloride  3 mL Intravenous Q12H  . [DISCONTINUED] warfarin  5-7.5 mg Oral Daily    Assessment: 59 yoM with episode of chest pain today, currently pain resolved. Hx of CAD s/p PTCA x 2, PAD, AAA repair, mild Aortic stenosis. Chronic Warfarin for Afib; home dose 5mg  daily exc 7.5mg  on Monday, last  dose 4/8.  Hold Warfarin per progress note; may need procedure. Initial enzymes, ECG negative  ASA, cycle enzymes, add Beta blocker, NTG paste   Begin Heparin if INR < 2, INR on admit 2.15  Goal of Therapy:  Heparin level 0.3-0.7 units/ml Monitor platelets by anticoagulation protocol: Yes   Plan:  No Heparin tonight Daily PT/INR  Follow INR for initiation of Heparin Follow enzymes, ECG  Otho Bellows PharmD Pager [phone] 05/22/2012, 7:04 PM

## 2012-05-22 NOTE — ED Notes (Signed)
md at bedside  Pt alert and oriented x4. Respirations even and unlabored, bilateral symmetrical rise and fall of chest. Skin warm and dry. In no acute distress. Denies needs.   

## 2012-05-22 NOTE — H&P (Addendum)
Physician History and Physical    Tyler Williams MRN: [id number] DOB/AGE: 1926/08/03 79 y.o. Admit date: 05/22/2012  Primary Care Physician: Dr. Jacky Kindle Primary Cardiologist: Dr. Riley Kill  HPI:  79 yo with history of chronic atrial fibrillation on coumadin, CAD s/p PTCA in 1980s and 1990s, PAD, AAA repair, and mild AS presented to the ER with chest pain.  He had been doing well over the last few years with no chest pain episodes.  Today, he was out at the driving range hitting golf balls with a friend.  He developed severe substernal chest pain.  This persisted while he was at the driving range and continued when he got home.  He checked his BP at home and found that his systolic pressure was 215.  He laid on the couch for a while but the pain did not resolve.  Therefore, he went to the ER.  In the ER, the pain resolved on its own without intervention.  He is pain-free currently but is anxious.  CP lasted 1.5 hours total.  His initial cardiac enzymes were negative and ECG was non-acute.  He also reported some lower abdominal pain as well when he got to the ER.  This pain was "gassy."  The abdominal pain has resolved entirely.  No diarrhea, nausea, vomiting.  At baseline, he has no significant exercise intolerance.  He walks for 2 miles for exercise.   Review of systems complete and found to be negative unless listed above   PMH: 1. Hyperlipidemia 2. Chronic atrial fibrillation on coumadin 3. Macular degeneration 4. CAD: PTCA in the 1980s and again in the 1990s.  5. PAD: Dr. Rosalyn Charters last note indicates that he has had aortobifemoral bypass though I do not see a vascular surgery note recently.  6. AAA s/p repair in 1993.  7. L knee surgery (12/13) 8. Right inguinal hernia repair 9. GERD 10. PE in 1964 11. Carotid bruit: Mild disease on 10/13 carotid dopplers 12. Aortic stenosis: Echo (3/14) with EF 65-70%, mild AS with mean gradient 17 mmHg, mild MR, PA systolic pressure 32 mmHg.    History reviewed. No pertinent family history.  History   Social History  . Marital Status: Married    Spouse Name: N/A    Number of Children: N/A  . Years of Education: N/A   Occupational History  . Not on file.   Social History Main Topics  . Smoking status: Former Smoker    Types: Cigarettes    Quit date: 02/14/1984  . Smokeless tobacco: Never Used  . Alcohol Use: 4.2 oz/week    7 Glasses of wine per week     Comment: 5 ounces of wine daily   . Drug Use: No  . Sexually Active: Not on file   Other Topics Concern  . Not on file   Social History Narrative  . No narrative on file      (Not in a hospital admission)  Physical Exam: Blood pressure 158/64, pulse 65, temperature 98.1 F (36.7 C), temperature source Oral, resp. rate 14, SpO2 98.00%.  General: NAD Neck: JVP 8-9 cm, no thyromegaly or thyroid nodule.  Lungs: Clear to auscultation bilaterally with normal respiratory effort. CV: Nondisplaced PMI.  Heart regular S1/S2, no S3/S4, 2/6 systolic crescendo-decrescendo murmur RUSB.  No peripheral edema.  Bilateral soft carotid bruits, may be radiated from heart murmur.  Normal pedal pulses.  Abdomen: Soft, nontender, no hepatosplenomegaly, no distention.  Skin: Intact without lesions or rashes.  Neurologic: Alert and  oriented x 3.  Psych: Normal affect. Extremities: No clubbing or cyanosis.  HEENT: Normal.   Labs:   Lab Results  Component Value Date   WBC 8.4 05/22/2012   HGB 16.7 05/22/2012   HCT 48.6 05/22/2012   MCV 92.4 05/22/2012   PLT 143* 05/22/2012    Recent Labs Lab 05/22/12 1446  NA 135  K 4.3  CL 100  CO2 24  BUN 22  CREATININE 0.92  CALCIUM 9.4  GLUCOSE 126*   TnI 0.03    Radiology: - CXR: low lung volumes  EKG: atrial fibrillation, RBBB, LPFB  ASSESSMENT AND PLAN:  79 yo with history of chronic atrial fibrillation on coumadin, CAD s/p PTCA in 1980s and 1990s, PAD, AAA repair, and mild AS presented to the ER with chest pain.  1. Chest  pain: Pain with exertion is concerning for possible unstable angina.  He is currently pain-free with initial enzymes negative and non-acute ECG.  - Cycle cardiac enzymes. - Treat with ASA.  Will also replace verapamil with Toprol XL 50 mg bid.  Continue atorvastatin. - NTG paste 1 inch - Will hold coumadin for now in case procedure is needed => heparin gtt if INR < 2.   - If he rules out for MI with no further CP, will need to decide cath versus stress test.  2. Atrial fibrillation: chronic, rate controlled.  As above, hold warfarin for now in case procedure is needed.  Toprol XL for rate control.  3. Murmur: Mild aortic stenosis by recent echo.  4. CHF: Mild elevation in JVP but no significant exertional dyspnea.  Will get BNP.  EF normal on echo in 3/14.   Signed: Marca Ancona 05/22/2012, 5:38 PM Co-Sign MD

## 2012-05-22 NOTE — ED Notes (Signed)
Cardiology at bedside.

## 2012-05-22 NOTE — ED Notes (Signed)
[id number]> Expected date:<BR> Expected time:<BR> Means of arrival:<BR> Comments:<BR> Pt still in room

## 2012-05-22 NOTE — ED Notes (Signed)
Report given to ruby, rn on floor

## 2012-05-22 NOTE — ED Notes (Addendum)
Pt reports chest pain that started around 1330. Cardiac hx. Pt reports hx of abdominal aorta surgery 1993, reports bowel movement this morning, then abdominal pain, had an enema, still having bowel pain. Reports left sided chest pain 10/10 started when pt was hitting golf balls. Denies SOB, n/v. Abdominal pain 5/10.   At 1600 pt denies any chest pain. Reports abdominal pain still present.

## 2012-05-22 NOTE — ED Provider Notes (Signed)
History     CSN: [id number]  Arrival date & time 05/22/12  1432   First MD Initiated Contact with Patient 05/22/12 1510      Chief Complaint  Patient presents with  . Chest Pain  . Abdominal Pain     HPI Pt reports chest pain that started around 1330. Cardiac hx. Pt reports hx of abdominal aorta surgery 1993, reports bowel movement this morning, then abdominal pain, had an enema, still having bowel pain. Reports left sided chest pain started when pt was hitting golf balls. Denies SOB, n/v.  Past Medical History  Diagnosis Date  . CAD (coronary artery disease)     with multiple percutaneous prior coronary  artery interventions  . PAF (paroxysmal atrial fibrillation)   . PVD (peripheral vascular disease)   . Hypercholesteremia   . SSS (sick sinus syndrome)   . Edema     lower extremity-right greater than left  . Hypertension   . GERD (gastroesophageal reflux disease)   . Cancer     hx of skin cancer   . Arthritis     knees   . Pulmonary embolism     1964 related to dislocated hip on right     Past Surgical History  Procedure Laterality Date  . Angioplasty    . Orthopedic surgery    . Coronary angioplasty    . Hernia repair      right inguinal hernia repair   . Abdominal aortic aneurysm repair      1993   . Other surgical history      right knee popliteal aneurysm surgery stent placed   . Tonsillectomy    . Other surgical history    . Hemorrhoid surgery      1973  . Eye surgery      hx of cataract surgery   . Joint replacement      left knee replacement  . Partial knee arthroplasty  01/22/2012    Procedure: UNICOMPARTMENTAL KNEE;  Surgeon: [name], MD;  Location: [facility];  Service: Orthopedics;  Laterality: Right;    History reviewed. No pertinent family history.  History  Substance Use Topics  . Smoking status: Former Smoker    Types: Cigarettes    Quit date: 02/14/1983  . Smokeless tobacco: Never Used  . Alcohol Use: 4.2 oz/week    7 Glasses of  wine per week     Comment: 5 ounces of wine daily       Review of Systems  Constitutional: Negative for fever and fatigue.  Gastrointestinal: Negative for abdominal distention.    Allergies  Review of patient's allergies indicates no known allergies.  Home Medications   No current outpatient prescriptions on file.  BP 141/70  Pulse 76  Temp(Src) 97.5 F (36.4 C) (Oral)  Resp 19  Ht 5' 9.5" (1.765 m)  Wt 193 lb 9 oz (87.8 kg)  BMI 28.18 kg/m2  SpO2 100%  Physical Exam  Nursing note and vitals reviewed. Constitutional: He is oriented to person, place, and time. He appears well-developed and well-nourished. No distress.  HENT:  Head: Normocephalic and atraumatic.  Eyes: Pupils are equal, round, and reactive to light.  Neck: Normal range of motion.  Cardiovascular: Normal rate and intact distal pulses.   Murmur heard. Pulmonary/Chest: No respiratory distress.  Abdominal: Normal appearance. He exhibits no distension. There is tenderness. There is guarding. There is no rebound.  Musculoskeletal: Normal range of motion.  Neurological: He is alert and oriented to person,  place, and time. No cranial nerve deficit.  Skin: Skin is warm and dry. No rash noted.  Psychiatric: He has a normal mood and affect. His behavior is normal.    ED Course  Procedures (including critical care time)  Date: 05/22/2012  Rate: 96  Rhythm: Atrial fibrillation  QRS Axis: normal  Intervals: normal  ST/T Wave abnormalities: normal  Conduction Disutrbances: Right bundle branch block  Narrative Interpretation: Abnormal EKG     Labs Reviewed  CBC - Abnormal; Notable for the following:    Platelets 143 (*)    All other components within normal limits  BASIC METABOLIC PANEL - Abnormal; Notable for the following:    Glucose, Bld 126 (*)    GFR calc non Af Amer 75 (*)    GFR calc Af Amer 87 (*)    All other components within normal limits  PROTIME-INR - Abnormal; Notable for the following:     Prothrombin Time 23.1 (*)    INR 2.15 (*)    All other components within normal limits  PROTIME-INR - Abnormal; Notable for the following:    Prothrombin Time 22.0 (*)    INR 2.01 (*)    All other components within normal limits  PRO B NATRIURETIC PEPTIDE - Abnormal; Notable for the following:    Pro B Natriuretic peptide (BNP) 981.1 (*)    All other components within normal limits  CBC - Abnormal; Notable for the following:    WBC 14.0 (*)    Platelets 125 (*)    All other components within normal limits  BASIC METABOLIC PANEL - Abnormal; Notable for the following:    Sodium 133 (*)    GFR calc non Af Amer 81 (*)    All other components within normal limits  MRSA PCR SCREENING  PRO B NATRIURETIC PEPTIDE  TROPONIN I  TROPONIN I  TROPONIN I  LIPID PANEL  HEPARIN LEVEL (UNFRACTIONATED)  POCT I-STAT TROPONIN I   Dg Chest Port 1 View  05/22/2012  *RADIOLOGY REPORT*  Clinical Data: Chest pain  PORTABLE CHEST - 1 VIEW  Comparison: 05/09/2011  Findings: The heart size is normal.  There are no pleural effusions or interstitial edema.  The lung volumes are low.  No airspace consolidation.  IMPRESSION:  1.  Low lung volumes.   Original Report Authenticated By: Signa Kell, M.D.      1. Aortic stenosis   2. Atrial fibrillation   3. Unstable angina       MDM  Cardiology consulted.       Nelia Shi, MD 05/23/12 1324

## 2012-05-22 NOTE — ED Notes (Signed)
Brandis Matsuura (wife) can be reached at   [phone] cell  [phone] home

## 2012-05-23 LAB — CBC
HCT: 43.1 % (ref 39.0–52.0)
Hemoglobin: 14.8 g/dL (ref 13.0–17.0)
MCH: 31.3 pg (ref 26.0–34.0)
MCHC: 34.3 g/dL (ref 30.0–36.0)
MCV: 91.1 fL (ref 78.0–100.0)
Platelets: 125 10*3/uL — ABNORMAL LOW (ref 150–400)
RBC: 4.73 MIL/uL (ref 4.22–5.81)
RDW: 14.1 % (ref 11.5–15.5)
WBC: 14 10*3/uL — ABNORMAL HIGH (ref 4.0–10.5)

## 2012-05-23 LAB — BASIC METABOLIC PANEL
BUN: 15 mg/dL (ref 6–23)
CO2: 23 mEq/L (ref 19–32)
Calcium: 8.4 mg/dL (ref 8.4–10.5)
Chloride: 100 mEq/L (ref 96–112)
Creatinine, Ser: 0.76 mg/dL (ref 0.50–1.35)
GFR calc Af Amer: >90 mL/min (ref >90)
GFR calc non Af Amer: 81 mL/min — ABNORMAL LOW (ref >90)
Glucose, Bld: 94 mg/dL (ref 70–99)
Potassium: 4.3 mEq/L (ref 3.5–5.1)
Sodium: 133 mEq/L — ABNORMAL LOW (ref 135–145)

## 2012-05-23 LAB — LIPID PANEL
Cholesterol: 112 mg/dL (ref 0–200)
HDL: 49 mg/dL (ref >39)
LDL Cholesterol: 52 mg/dL (ref 0–99)
Total CHOL/HDL Ratio: 2.3 RATIO
Triglycerides: 56 mg/dL (ref <150)
VLDL: 11 mg/dL (ref 0–40)

## 2012-05-23 LAB — TROPONIN I
Troponin I: <0.3 ng/mL (ref <0.30)
Troponin I: <0.3 ng/mL (ref <0.30)

## 2012-05-23 LAB — PROTIME-INR
INR: 2.01 — ABNORMAL HIGH (ref 0.00–1.49)
Prothrombin Time: 22 seconds — ABNORMAL HIGH (ref 11.6–15.2)

## 2012-05-23 LAB — HEPARIN LEVEL (UNFRACTIONATED): Heparin Unfractionated: <0.1 IU/mL — ABNORMAL LOW (ref 0.30–0.70)

## 2012-05-23 MED ORDER — HEPARIN (PORCINE) IN NACL 100-0.45 UNIT/ML-% IJ SOLN
1250.0000 [IU]/h | INTRAMUSCULAR | Status: DC
  Administered 2012-05-23: 1250 [IU]/h via INTRAVENOUS
  Filled 2012-05-23 (×2): qty 250

## 2012-05-23 MED ORDER — SODIUM CHLORIDE 0.9 % IV SOLN
1.0000 mL/kg/h | INTRAVENOUS | Status: DC
  Administered 2012-05-24: 1 mL/kg/h via INTRAVENOUS

## 2012-05-23 MED ORDER — SODIUM CHLORIDE 0.9 % IJ SOLN: 3.0000 mL | INTRAMUSCULAR | Status: DC | PRN

## 2012-05-23 MED ORDER — ASPIRIN 81 MG PO CHEW
324.0000 mg | CHEWABLE_TABLET | ORAL | Status: AC
  Administered 2012-05-24: 324 mg via ORAL
  Filled 2012-05-23: qty 4

## 2012-05-23 MED ORDER — ALPRAZOLAM 1 MG PO TABS
1.0000 mg | ORAL_TABLET | Freq: Once | ORAL | Status: AC
  Administered 2012-05-23: 1 mg via ORAL
  Filled 2012-05-23: qty 1

## 2012-05-23 MED ORDER — SODIUM CHLORIDE 0.9 % IV SOLN: 250.0000 mL | INTRAVENOUS | Status: DC | PRN

## 2012-05-23 MED ORDER — HEPARIN (PORCINE) IN NACL 100-0.45 UNIT/ML-% IJ SOLN
1600.0000 [IU]/h | INTRAMUSCULAR | Status: DC
  Administered 2012-05-24: 1600 [IU]/h via INTRAVENOUS
  Filled 2012-05-23: qty 250

## 2012-05-23 MED ORDER — SODIUM CHLORIDE 0.9 % IJ SOLN
3.0000 mL | Freq: Two times a day (BID) | INTRAMUSCULAR | Status: DC
  Administered 2012-05-23: 3 mL via INTRAVENOUS

## 2012-05-23 NOTE — Progress Notes (Addendum)
Patient ID: Tyler Williams, male   DOB: 14-Aug-1926, 79 y.o.   MRN: [id number]    SUBJECTIVE: No further chest pain, uneventful night.   Marland Kitchen aspirin EC  81 mg Oral Daily  . atorvastatin  20 mg Oral q1800  . ezetimibe  10 mg Oral q1800  . hydrochlorothiazide  12.5 mg Oral Daily  . metoprolol succinate  50 mg Oral BID  . nitroGLYCERIN  1 inch Topical Q6H  . omega-3 acid ethyl esters  1 g Oral Daily  . psyllium  1 packet Oral Daily  . ramipril  10 mg Oral BID  . sodium chloride  3 mL Intravenous Q12H      Filed Vitals:   05/22/12 2244 05/23/12 0023 05/23/12 0400 05/23/12 0500  BP: 155/80 152/74  118/61  Pulse:  80 70 71  Temp:  98.7 F (37.1 C) 98.7 F (37.1 C)   TempSrc:  Oral Oral   Resp:  16 25 23   Height:      Weight:   193 lb 9 oz (87.8 kg)   SpO2:  98% 95% 93%    Intake/Output Summary (Last 24 hours) at 05/23/12 0725 Last data filed at 05/23/12 0519  Gross per 24 hour  Intake    323 ml  Output    500 ml  Net   -177 ml    LABS: Basic Metabolic Panel:  Recent Labs  14/78/29 1446 05/23/12 0630  NA 135 133*  K 4.3 4.3  CL 100 100  CO2 24 23  GLUCOSE 126* 94  BUN 22 15  CREATININE 0.92 0.76  CALCIUM 9.4 8.4  INR 2 BNP 981  Liver Function Tests: No results found for this basename: AST, ALT, ALKPHOS, BILITOT, PROT, ALBUMIN,  in the last 72 hours No results found for this basename: LIPASE, AMYLASE,  in the last 72 hours CBC:  Recent Labs  05/22/12 1446 05/23/12 0630  WBC 8.4 14.0*  HGB 16.7 14.8  HCT 48.6 43.1  MCV 92.4 91.1  PLT 143* 125*   Cardiac Enzymes:  Recent Labs  05/22/12 1857 05/23/12 0011 05/23/12 0630  TROPONINI <0.30 <0.30 <0.30   BNP: No components found with this basename: POCBNP,  D-Dimer: No results found for this basename: DDIMER,  in the last 72 hours Hemoglobin A1C: No results found for this basename: HGBA1C,  in the last 72 hours Fasting Lipid Panel: No results found for this basename: CHOL, HDL, LDLCALC, TRIG,  CHOLHDL, LDLDIRECT,  in the last 72 hours Thyroid Function Tests: No results found for this basename: TSH, T4TOTAL, FREET3, T3FREE, THYROIDAB,  in the last 72 hours Anemia Panel: No results found for this basename: VITAMINB12, FOLATE, FERRITIN, TIBC, IRON, RETICCTPCT,  in the last 72 hours  RADIOLOGY: Dg Chest Port 1 View  05/22/2012  *RADIOLOGY REPORT*  Clinical Data: Chest pain  PORTABLE CHEST - 1 VIEW  Comparison: 05/09/2011  Findings: The heart size is normal.  There are no pleural effusions or interstitial edema.  The lung volumes are low.  No airspace consolidation.  IMPRESSION:  1.  Low lung volumes.   Original Report Authenticated By: Signa Kell, M.D.     PHYSICAL EXAM General: NAD Neck: JVP 8-9 cm, no thyromegaly or thyroid nodule.  Lungs: Clear to auscultation bilaterally with normal respiratory effort. CV: Nondisplaced PMI.  Heart regular S1/S2, no S3/S4, 3/6 crescendo-decrescendo murmur LUSB with clear S2.  No peripheral edema.  No carotid bruit.  Normal pedal pulses.  Abdomen: Soft, nontender, no hepatosplenomegaly, no  distention.  Neurologic: Alert and oriented x 3.  Psych: Normal affect. Extremities: No clubbing or cyanosis.   TELEMETRY: Reviewed telemetry pt in atrial fibrillation in the 70s  ASSESSMENT AND PLAN:  79 yo with history of chronic atrial fibrillation on coumadin, CAD s/p PTCA in 1980s and 1990s, PAD, AAA repair, and mild AS presented to the ER with worrisome chest pain.  1. Chest pain: Prolonged pain beginning with exertion is concerning for possible unstable angina. He is currently pain-free with negative cardiac enzymes and non-acute ECG.  - Treat with ASA. I replaced verapamil with Toprol XL 50 mg bid. Continue atorvastatin.  - NTG paste 1 inch  - I think that LHC may be the best course here given his prior history and the concerning nature of the chest pain.  He is agreeable to this.  Will plan for cath when INR </= 1.8 (hopefully tomorrow as it is 2.0  today).  Would use radial access given history of aortobifemoral bypass (from last cardiology note though unable to locate vascular notes).  2. Atrial fibrillation: chronic, rate controlled. As above, holding warfarin for cath. Toprol XL for rate control.  3. Murmur: Mild aortic stenosis by recent echo.  4. Diastolic CHF: Mild elevation in JVP but no significant exertional dyspnea. BNP elevated. EF normal on echo in 3/14.  He will need to be careful with sodium, at the very least should restart HCTZ (had not been taking at home).  5. Thrombocytopenia: Mild, chronic.   Marca Ancona 05/23/2012 7:32 AM

## 2012-05-23 NOTE — Progress Notes (Signed)
Brief Pharmacy Consult Note - Heparin  Heparin level < 0.1 on 1250 units/hr No bleeding or problems with IV pump reported per RN  Goal heparin levels 0.3-0.7  Plan:  Increase heparin to 1600 units/hr  Recheck level with am labs  Loralee Pacas, PharmD, BCPS 05/23/2012 9:04 PM

## 2012-05-23 NOTE — Progress Notes (Signed)
ANTICOAGULATION CONSULT NOTE - Initial Consult  Pharmacy Consult for Heparin Indication: r/o ACS/STEMI; on chronic warfarin for Afib  No Known Allergies  Patient Measurements: Height: 5' 9.5" (176.5 cm) Weight: 193 lb 9 oz (87.8 kg) IBW/kg (Calculated) : 71.85  Vital Signs: Temp: 98.7 F (37.1 C) (04/10 0400) Temp src: Oral (04/10 0400) BP: 118/61 mmHg (04/10 0500) Pulse Rate: 71 (04/10 0500)  Labs:  Recent Labs  05/22/12 1446 05/22/12 1810 05/22/12 1857 05/23/12 0011 05/23/12 0630  HGB 16.7  --   --   --  14.8  HCT 48.6  --   --   --  43.1  PLT 143*  --   --   --  125*  LABPROT  --  23.1*  --   --  22.0*  INR  --  2.15*  --   --  2.01*  CREATININE 0.92  --   --   --  0.76  TROPONINI  --   --  <0.30 <0.30 <0.30    Estimated Creatinine Clearance: 76.1 ml/min (by C-G formula based on Cr of 0.76).   Medical History: Past Medical History  Diagnosis Date  . CAD (coronary artery disease)     with multiple percutaneous prior coronary  artery interventions  . PAF (paroxysmal atrial fibrillation)   . PVD (peripheral vascular disease)   . Hypercholesteremia   . SSS (sick sinus syndrome)   . Edema     lower extremity-right greater than left  . Hypertension   . GERD (gastroesophageal reflux disease)   . Cancer     hx of skin cancer   . Arthritis     knees   . Pulmonary embolism     1964 related to dislocated hip on right     Medications:  Scheduled:  . [COMPLETED] ALPRAZolam  1 mg Oral Once  . aspirin EC  81 mg Oral Daily  . atorvastatin  20 mg Oral q1800  . ezetimibe  10 mg Oral q1800  . hydrochlorothiazide  12.5 mg Oral Daily  . metoprolol succinate  50 mg Oral BID  . nitroGLYCERIN  1 inch Topical Q6H  . omega-3 acid ethyl esters  1 g Oral Daily  . psyllium  1 packet Oral Daily  . ramipril  10 mg Oral BID  . sodium chloride  3 mL Intravenous Q12H  . [DISCONTINUED] warfarin  5-7.5 mg Oral Daily   Infusions:    Assessment: 86 yoM admit 4/9 with chest  pain (resolved) and hx of CAD s/p PTCA x 2, PAD, AAA repair, mild Aortic stenosis, chronic afib on warfarin.    Warfarin PTA dosing:  5mg  daily exc 7.5mg  on Monday, last dose 4/8, INR (2.15) was therapeutic.  Warfarin was held on admission in anticipation of cardiac procedure.  INR (2.01) remains just in the therapeutic range.  Pharmacy asked to dose Heparin IV when INR < 2  CBC: Hgb stable and Plt with mild decrease.  Troponin: < 0.3 x3   Goal of Therapy:  Heparin level 0.3-0.7 units/ml Monitor platelets by anticoagulation protocol: Yes   Plan:   No Bolus d/t elevated INR  Start heparin IV infusion at 1250 units/hr, start at 1200 on 4/10  Heparin level 8 hours after starting  Daily heparin level and CBC  Beatrix Shipper  05/23/2012,10:38 AM   Lynann Beaver PharmD, BCPS Pager [phone] 05/23/2012 11:48 AM

## 2012-05-24 ENCOUNTER — Encounter (HOSPITAL_COMMUNITY)
Admission: EM | Disposition: A | Discharge status: Home / Self Care | Payer: Self-pay | Source: Home / Self Care | Attending: Cardiology

## 2012-05-24 DIAGNOSIS — I2510 Atherosclerotic heart disease of native coronary artery without angina pectoris: Secondary | ICD-10-CM

## 2012-05-24 HISTORY — PX: CARDIAC CATHETERIZATION: SHX172

## 2012-05-24 HISTORY — PX: PERCUTANEOUS CORONARY INTERVENTION-BALLOON ONLY: SHX6014

## 2012-05-24 HISTORY — PX: LEFT HEART CATHETERIZATION WITH CORONARY ANGIOGRAM: SHX5451

## 2012-05-24 LAB — BASIC METABOLIC PANEL
BUN: 13 mg/dL (ref 6–23)
CO2: 25 mEq/L (ref 19–32)
Calcium: 8.3 mg/dL — ABNORMAL LOW (ref 8.4–10.5)
Chloride: 98 mEq/L (ref 96–112)
Creatinine, Ser: 0.83 mg/dL (ref 0.50–1.35)
GFR calc Af Amer: >90 mL/min (ref >90)
GFR calc non Af Amer: 79 mL/min — ABNORMAL LOW (ref >90)
Glucose, Bld: 97 mg/dL (ref 70–99)
Potassium: 3.8 mEq/L (ref 3.5–5.1)
Sodium: 131 mEq/L — ABNORMAL LOW (ref 135–145)

## 2012-05-24 LAB — CBC
HCT: 41.3 % (ref 39.0–52.0)
Hemoglobin: 14.5 g/dL (ref 13.0–17.0)
MCH: 31.9 pg (ref 26.0–34.0)
MCHC: 35.1 g/dL (ref 30.0–36.0)
MCV: 90.8 fL (ref 78.0–100.0)
Platelets: 125 10*3/uL — ABNORMAL LOW (ref 150–400)
RBC: 4.55 MIL/uL (ref 4.22–5.81)
RDW: 14.2 % (ref 11.5–15.5)
WBC: 10.8 10*3/uL — ABNORMAL HIGH (ref 4.0–10.5)

## 2012-05-24 LAB — HEPARIN LEVEL (UNFRACTIONATED): Heparin Unfractionated: 0.39 IU/mL (ref 0.30–0.70)

## 2012-05-24 LAB — POCT ACTIVATED CLOTTING TIME
Activated Clotting Time: 965 seconds
Activated Clotting Time: 187 seconds
Activated Clotting Time: 192 seconds
Activated Clotting Time: 165 seconds

## 2012-05-24 LAB — PROTIME-INR
INR: 1.59 — ABNORMAL HIGH (ref 0.00–1.49)
Prothrombin Time: 18.5 seconds — ABNORMAL HIGH (ref 11.6–15.2)

## 2012-05-24 SURGERY — LEFT HEART CATHETERIZATION WITH CORONARY ANGIOGRAM
Anesthesia: LOCAL

## 2012-05-24 MED ORDER — MIDAZOLAM HCL 2 MG/2ML IJ SOLN
INTRAMUSCULAR | Status: AC
  Filled 2012-05-24: qty 2

## 2012-05-24 MED ORDER — HEPARIN SODIUM (PORCINE) 1000 UNIT/ML IJ SOLN
INTRAMUSCULAR | Status: AC
  Filled 2012-05-24: qty 1

## 2012-05-24 MED ORDER — HEPARIN (PORCINE) IN NACL 2-0.9 UNIT/ML-% IJ SOLN
INTRAMUSCULAR | Status: AC
  Filled 2012-05-24: qty 1000

## 2012-05-24 MED ORDER — CLOPIDOGREL BISULFATE 300 MG PO TABS
ORAL_TABLET | ORAL | Status: AC
  Filled 2012-05-24: qty 2

## 2012-05-24 MED ORDER — BIVALIRUDIN 250 MG IV SOLR
INTRAVENOUS | Status: AC
  Filled 2012-05-24: qty 250

## 2012-05-24 MED ORDER — SODIUM CHLORIDE 0.9 % IV SOLN
0.2500 mg/kg/h | INTRAVENOUS | Status: AC
  Administered 2012-05-24: 0.25 mg/kg/h via INTRAVENOUS
  Filled 2012-05-24: qty 250

## 2012-05-24 MED ORDER — LIDOCAINE HCL (PF) 1 % IJ SOLN
INTRAMUSCULAR | Status: AC
  Filled 2012-05-24: qty 30

## 2012-05-24 MED ORDER — ASPIRIN 81 MG PO CHEW
81.0000 mg | CHEWABLE_TABLET | Freq: Every day | ORAL | Status: DC
  Administered 2012-05-25: 81 mg via ORAL
  Filled 2012-05-24: qty 1

## 2012-05-24 MED ORDER — WARFARIN SODIUM 5 MG PO TABS
5.0000 mg | ORAL_TABLET | Freq: Once | ORAL | Status: AC
  Administered 2012-05-24: 20:00:00 5 mg via ORAL
  Filled 2012-05-24: qty 1

## 2012-05-24 MED ORDER — FAMOTIDINE IN NACL 20-0.9 MG/50ML-% IV SOLN
INTRAVENOUS | Status: AC
  Filled 2012-05-24: qty 50

## 2012-05-24 MED ORDER — ISOSORBIDE MONONITRATE ER 30 MG PO TB24
30.0000 mg | ORAL_TABLET | Freq: Every day | ORAL | Status: DC
  Administered 2012-05-24: 30 mg via ORAL
  Filled 2012-05-24 (×2): qty 1

## 2012-05-24 MED ORDER — WARFARIN - PHARMACIST DOSING INPATIENT: Freq: Every day | Status: DC

## 2012-05-24 MED ORDER — VERAPAMIL HCL 2.5 MG/ML IV SOLN
INTRAVENOUS | Status: AC
  Filled 2012-05-24: qty 2

## 2012-05-24 MED ORDER — HEPARIN (PORCINE) IN NACL 2-0.9 UNIT/ML-% IJ SOLN
INTRAMUSCULAR | Status: AC
  Filled 2012-05-24: qty 500

## 2012-05-24 MED ORDER — SODIUM CHLORIDE 0.9 % IV SOLN
INTRAVENOUS | Status: AC
  Administered 2012-05-24: 12:00:00 via INTRAVENOUS

## 2012-05-24 NOTE — CV Procedure (Addendum)
Cardiac Catheterization Operative Report  JA OHMAN [id number] 4/11/201411:27 AM ARONSON,RICHARD A, MD  Procedure Performed:  1. Left Heart Catheterization 2. Selective Coronary Angiography 3. Left ventricular pressures 4. Attempted PCI of the mid RCA  Operator: Verne Carrow, MD  Indication:   79 yo male with history of CAD with remote PTCA of ? Vessels in 1980s and 1990s. Admitted to [facility] with chest pain c/w unstable angina. Cardiac markers negative. Diagnostic cath to exclude progression of CAD.                                 Procedure Details: The risks, benefits, complications, treatment options, and expected outcomes were discussed with the patient. The patient and/or family concurred with the proposed plan, giving informed consent. The patient was brought to the cath lab after IV hydration was begun and oral premedication was given. The patient was further sedated with Versed. Allens test was positive on the right wrist. The right wrist was prepped and draped in a sterile fashion. 1% lidocaine was used for local anesthesia.  I then easily placed a 5 French sheath in the right radial artery. There was severe tortuosity in there right brachial artery and right subclavian artery. I was able to advance a wire into the aortic root but was unable to deliver a catheter into the arch. He has had previous aortobifemoral bypass grafting. There was a palpable pulse in the left groin. The left groin was prepped and draped in the usual manner. Using the modified Seldinger access technique, a 5 French sheath was placed in the left femoral artery. There was considerable difficulty advancing the wire into the distal aorta but eventually we were able to navigate through the graft. Standard diagnostic catheters were used to perform selective coronary angiography. A pigtail catheter was used to measure LV pressures.   He was found to have 2 severe stenoses in the mid RCA. The  mid RCA was heavily calcified. The second stenosis is just after a small RV marginal branch. It was felt that these were his culprit lesions. The sheath was upsized to a 6 [place] system. I then engaged the RCA with a 6 [place] JR4 guiding catheter. He was given a bolus of Angiomax and a drip was started. He was given 600 mg Plavix po x 1. When the ACT was greater than 200, I attempted to pass a BMW wire beyond the stenosis but this wire was selective into the RV marginal branch. I then passed a Whisper wire into the distal RCA. I was unable to pass any balloons beyond the stenosis. As noted above, the entire mid RCA was heavily calcified. I felt that the lesion would require Rotablator atherectomy, if we could deliver a Rotafloppy wire beyond the stenosis. With the Whisper wire in place in the distal lumen, I attempted to pass a Rotablator wire down the vessel but it would not cross the severe stenosis.  I then placed a docking wire on the back end of the short Whisper wire which was intra-luminal. I could not pass an OTW balloon beyond the stenosis. My thought here was that I could use the OTW as a transit for the Rotafloppy wire. I then attempted to pass a FineCross transit catheter over the long Whisper wire beyond the lesion but it would not cross. I then removed the Whisper wire and attempted to pass the Rotafloppy wire across the stenosis with  better support from this catheter but it would not cross. Since I could not cross the lesions with a balloon or transit catheter and I was unable to pass the Rotafloppy wire alone, Rotablator atherectomy was not an option. Balloon inflations not possible since I could never pass a balloon beyond the stenosis. Fluoroscopy time at 42.4 minutes and procedure aborted. Final angiography demonstrated no dissection or damage to the vessel.   There were no immediate complications. The patient was taken to the recovery area in stable condition.   Hemodynamic Findings: Central  aortic pressure: 103/59 Left ventricular pressure: 111/13/15  Angiographic Findings:  Left main:  No obstructive disease.   Left Anterior Descending Artery:  Large caliber vessel that courses to the apex. There is mild plaque in the proximal, mid and distal vessel has mild plaque disease. The first diagonal branch is moderate in caliber and has 20% proximal stenosis.   Circumflex Artery: Moderate caliber vessel with ostial 20% stenosis, 30% mid stenosis. Intermediate branch is small with ostial 30% stenosis. The first OM branch is moderate in caliber with mild plaque disease. The distal AV groove Circumflex has a 30% stenosis.   Right Coronary Artery: Large, dominant vessel with severe calcification throughout the proximal and mid vessel. The proximal vessel has serial 30-40% stenoses. The mid vessel has tandem 99% stenoses, severely calcified. The distal vessel has a 30% stenosis. The PDA is patent with mild plaque disease. The posterolateral branch is small with diffuse 40% stenosis.   Left Ventricular Angiogram: Deferred.   Impression: 1. Single vessel CAD 2. Unstable angina secondary to severe stenosis mid RCA. This lesion is heavily calcified with tandem 99% stenoses.  3. Unsuccessful attempted PCI of the mid RCA. ( As above, unable to pass balloon or transit catheter across mid stenosis due to calcification. Unable to pass Rotablator wire distally so Rotablator atherectomy unable to be completed.) 4. No good options for PCI.   Recommendations: As above, unable to open the severe lesions in the mid RCA as we could not pass a balloon or a Rotablator wire distally. I am not sure there are any other options for PCI. Would attempt medical management for now. Will watch overnight. Possible d/c home in the am if stable and f/u with Dr. Shirlee Latch. Will restart coumadin tonight. D/C Plavix. Resume ASA 81 mg po Qdaily.        Complications:  None. The patient tolerated the procedure well.

## 2012-05-24 NOTE — Progress Notes (Signed)
ANTICOAGULATION CONSULT NOTE - Follow Up Consult  Pharmacy Consult for Heparin  Indication: r/o ACS/STEMI; on chronic warfarin for Afib  No Known Allergies  Patient Measurements: Height: 5' 9.5" (176.5 cm) Weight: 191 lb 2.2 oz (86.7 kg) IBW/kg (Calculated) : 71.85  Vital Signs: Temp: 98.7 F (37.1 C) (04/11 0400) Temp src: Oral (04/11 0400) BP: 137/68 mmHg (04/11 0528) Pulse Rate: 74 (04/11 0400)  Labs:  Recent Labs  05/22/12 1446 05/22/12 1810 05/22/12 1857 05/23/12 0011 05/23/12 0630 05/23/12 1949 05/24/12 0330  HGB 16.7  --   --   --  14.8  --  14.5  HCT 48.6  --   --   --  43.1  --  41.3  PLT 143*  --   --   --  125*  --  125*  LABPROT  --  23.1*  --   --  22.0*  --  18.5*  INR  --  2.15*  --   --  2.01*  --  1.59*  HEPARINUNFRC  --   --   --   --   --  <0.10* 0.39  CREATININE 0.92  --   --   --  0.76  --  0.83  TROPONINI  --   --  <0.30 <0.30 <0.30  --   --     Estimated Creatinine Clearance: 72.9 ml/min (by C-G formula based on Cr of 0.83).  Medications:  Scheduled:  . [COMPLETED] aspirin  324 mg Oral Pre-Cath  . aspirin EC  81 mg Oral Daily  . atorvastatin  20 mg Oral q1800  . ezetimibe  10 mg Oral q1800  . hydrochlorothiazide  12.5 mg Oral Daily  . metoprolol succinate  50 mg Oral BID  . nitroGLYCERIN  1 inch Topical Q6H  . omega-3 acid ethyl esters  1 g Oral Daily  . psyllium  1 packet Oral Daily  . ramipril  10 mg Oral BID  . sodium chloride  3 mL Intravenous Q12H  . sodium chloride  3 mL Intravenous Q12H   Infusions:  . sodium chloride 1 mL/kg/hr (05/24/12 0352)  . heparin 1,600 Units/hr (05/24/12 0354)  . [DISCONTINUED] heparin 1,250 Units/hr (05/23/12 2000)    Assessment: 86 yoM admit 4/9 with chest pain (resolved) and hx of CAD s/p PTCA x 2, PAD, AAA repair, mild Aortic stenosis, chronic afib on warfarin.  Warfarin PTA dosing: 5mg  daily exc 7.5mg  on Monday, last dose 4/8, INR (2.15) was therapeutic. Warfarin was held on admission in  anticipation of cardiac procedure. Heparin begun 4/10 at 1250 units/hr, no bolus. First Heparin level low, rate increased to 1600 units/hr Hep level this am 0.39 units/ml, in goal range, with INR of 1.59 acceeptable for cardiac cath planned today. H/H & Plt decreased, no sign of bleed.  Goal of Therapy:  Heparin level 0.3-0.7 units/ml Monitor platelets by anticoagulation protocol: Yes   Plan:   Continue Heparin at 1600 units/hr, plan for cath today  Follow up orders post-cath for Heparin and resumption of Warfarin  Daily Heparin level, CBC  Otho Bellows PharmD Pager [phone] 05/24/2012, 7:16 AM

## 2012-05-24 NOTE — Progress Notes (Signed)
Site area: left groin  Site Prior to Removal:  Level 0  Pressure Applied For 20 MINUTES    Minutes Beginning at 1820  Manual:   yes  Patient Status During Pull:  stable  Post Pull Groin Site:  Level 0  Post Pull Instructions Given:  yes  Post Pull Pulses Present:  yes  Dressing Applied:  yes  Comments:    TR BAND REMOVAL  LOCATION:  right radial  DEFLATED PER PROTOCOL:  yes  TIME BAND OFF / DRESSING APPLIED:   1330   SITE UPON ARRIVAL:   Level 0  SITE AFTER BAND REMOVAL:  Level 0  REVERSE ALLEN'S TEST:    positive  CIRCULATION SENSATION AND MOVEMENT:  Within Normal Limits  yes  COMMENTS:

## 2012-05-24 NOTE — Progress Notes (Signed)
ANTICOAGULATION CONSULT NOTE - Follow Up Consult  Pharmacy Consult for coumadin Indication: atrial fibrillation  No Known Allergies  Patient Measurements: Height: 5' 9.5" (176.5 cm) Weight: 191 lb 2.2 oz (86.7 kg) IBW/kg (Calculated) : 71.85 Heparin Dosing Weight:   Vital Signs: Temp: 98.1 F (36.7 C) (04/11 1215) Temp src: Oral (04/11 1215) BP: 170/89 mmHg (04/11 1245) Pulse Rate: 69 (04/11 1245)  Labs:  Recent Labs  05/22/12 1446 05/22/12 1810 05/22/12 1857 05/23/12 0011 05/23/12 0630 05/23/12 1949 05/24/12 0330  HGB 16.7  --   --   --  14.8  --  14.5  HCT 48.6  --   --   --  43.1  --  41.3  PLT 143*  --   --   --  125*  --  125*  LABPROT  --  23.1*  --   --  22.0*  --  18.5*  INR  --  2.15*  --   --  2.01*  --  1.59*  HEPARINUNFRC  --   --   --   --   --  <0.10* 0.39  CREATININE 0.92  --   --   --  0.76  --  0.83  TROPONINI  --   --  <0.30 <0.30 <0.30  --   --     Estimated Creatinine Clearance: 72.9 ml/min (by C-G formula based on Cr of 0.83).   Medications:  Scheduled:  . [COMPLETED] aspirin  324 mg Oral Pre-Cath  . [START ON 05/25/2012] aspirin  81 mg Oral Daily  . atorvastatin  20 mg Oral q1800  . [COMPLETED] bivalirudin      . [COMPLETED] bivalirudin (ANGIOMAX) infusion 5 mg/mL (Cath Lab,ACS,PCI indication)  0.25 mg/kg/hr Intravenous To Cath  . [COMPLETED] clopidogrel      . ezetimibe  10 mg Oral q1800  . [COMPLETED] famotidine      . [COMPLETED] heparin      . [COMPLETED] heparin      . [COMPLETED] heparin      . hydrochlorothiazide  12.5 mg Oral Daily  . isosorbide mononitrate  30 mg Oral Daily  . [COMPLETED] lidocaine (PF)      . metoprolol succinate  50 mg Oral BID  . [COMPLETED] midazolam      . [COMPLETED] midazolam      . omega-3 acid ethyl esters  1 g Oral Daily  . psyllium  1 packet Oral Daily  . ramipril  10 mg Oral BID  . sodium chloride  3 mL Intravenous Q12H  . [COMPLETED] verapamil      . [DISCONTINUED] aspirin EC  81 mg Oral  Daily  . [DISCONTINUED] nitroGLYCERIN  1 inch Topical Q6H  . [DISCONTINUED] sodium chloride  3 mL Intravenous Q12H   Infusions:  . sodium chloride    . [DISCONTINUED] sodium chloride 1 mL/kg/hr (05/24/12 0352)  . [DISCONTINUED] heparin 1,250 Units/hr (05/23/12 2000)  . [DISCONTINUED] heparin 1,600 Units/hr (05/24/12 0809)    Assessment: 79 yo male with afib will be restarted on coumadin s/p cath.  INR today is 1.59.  Baseline H/H is 14.5/41.3 and Plt 125 K.  Patient was on coumadin 5mg  po qday, except 7.5mg  po on Mondays.  Admitting INR was therapeutic.  Goal of Therapy:  INR 2-3    Plan:  1) Coumadin 5mg  po x1 2) INR in am  Tyler Williams, Tyler Williams 05/24/2012,1:12 PM

## 2012-05-24 NOTE — Progress Notes (Signed)
Spoke to [place] in the cardiac cath lab.  Tyler Williams said 10 am medications could be given at [facility], where the cardiac cath is being performed.  Reinforced patient education concerning his pending cardiac cath.  Patient verbalized understanding.  Patient spoke to his wife over the telephone and notified her that the transport team would be coming at 0830. EKG performed and placed in the patient's chart. Patient is alert and oriented. Denies pain. [facility] came at 0830.  Patient was transported via stretcher to [facility] cardiac cath lab.

## 2012-05-24 NOTE — Interval H&P Note (Signed)
History and Physical Interval Note:  05/24/2012 9:26 AM  Tyler Williams  has presented today for cardiac cath with the diagnosis of unstable angina  The various methods of treatment have been discussed with the patient and family. After consideration of risks, benefits and other options for treatment, the patient has consented to  Procedure(s): LEFT HEART CATHETERIZATION WITH CORONARY ANGIOGRAM (N/A) as a surgical intervention .  The patient's history has been reviewed, patient examined, no change in status, stable for surgery.  I have reviewed the patient's chart and labs.  Questions were answered to the patient's satisfaction.     Eevie Lapp

## 2012-05-24 NOTE — Progress Notes (Addendum)
Patient ID: Tyler Williams, male   DOB: 06/04/1926, 79 y.o.   MRN: [id number]     SUBJECTIVE: No further chest pain, uneventful night.   . aspirin EC  81 mg Oral Daily  . atorvastatin  20 mg Oral q1800  . ezetimibe  10 mg Oral q1800  . hydrochlorothiazide  12.5 mg Oral Daily  . metoprolol succinate  50 mg Oral BID  . nitroGLYCERIN  1 inch Topical Q6H  . omega-3 acid ethyl esters  1 g Oral Daily  . psyllium  1 packet Oral Daily  . ramipril  10 mg Oral BID  . sodium chloride  3 mL Intravenous Q12H  . sodium chloride  3 mL Intravenous Q12H  heparin gtt    Filed Vitals:   05/24/12 0000 05/24/12 0300 05/24/12 0400 05/24/12 0528  BP: 154/83  127/62 137/68  Pulse: 81  74   Temp: 98.5 F (36.9 C)  98.7 F (37.1 C)   TempSrc: Oral  Oral   Resp: 23  25 18  Height:      Weight:  191 lb 2.2 oz (86.7 kg)    SpO2: 95%  95%     Intake/Output Summary (Last 24 hours) at 05/24/12 0726 Last data filed at 05/24/12 0600  Gross per 24 hour  Intake 1092.42 ml  Output   2010 ml  Net -917.58 ml    LABS: Basic Metabolic Panel:  Recent Labs  05/23/12 0630 05/24/12 0330  NA 133* 131*  K 4.3 3.8  CL 100 98  CO2 23 25  GLUCOSE 94 97  BUN 15 13  CREATININE 0.76 0.83  CALCIUM 8.4 8.3*  INR 2 BNP 981  Liver Function Tests: No results found for this basename: AST, ALT, ALKPHOS, BILITOT, PROT, ALBUMIN,  in the last 72 hours No results found for this basename: LIPASE, AMYLASE,  in the last 72 hours CBC:  Recent Labs  05/23/12 0630 05/24/12 0330  WBC 14.0* 10.8*  HGB 14.8 14.5  HCT 43.1 41.3  MCV 91.1 90.8  PLT 125* 125*   Cardiac Enzymes:  Recent Labs  05/22/12 1857 05/23/12 0011 05/23/12 0630  TROPONINI <0.30 <0.30 <0.30   BNP: No components found with this basename: POCBNP,  D-Dimer: No results found for this basename: DDIMER,  in the last 72 hours Hemoglobin A1C: No results found for this basename: HGBA1C,  in the last 72 hours Fasting Lipid Panel:  Recent  Labs  05/23/12 0630  CHOL 112  HDL 49  LDLCALC 52  TRIG 56  CHOLHDL 2.3   Thyroid Function Tests: No results found for this basename: TSH, T4TOTAL, FREET3, T3FREE, THYROIDAB,  in the last 72 hours Anemia Panel: No results found for this basename: VITAMINB12, FOLATE, FERRITIN, TIBC, IRON, RETICCTPCT,  in the last 72 hours  RADIOLOGY: Dg Chest Port 1 View  05/22/2012  *RADIOLOGY REPORT*  Clinical Data: Chest pain  PORTABLE CHEST - 1 VIEW  Comparison: 05/09/2011  Findings: The heart size is normal.  There are no pleural effusions or interstitial edema.  The lung volumes are low.  No airspace consolidation.  IMPRESSION:  1.  Low lung volumes.   Original Report Authenticated By: Taylor Stroud, M.D.     PHYSICAL EXAM General: NAD Neck: JVP 8-9 cm, no thyromegaly or thyroid nodule.  Lungs: Clear to auscultation bilaterally with normal respiratory effort. CV: Nondisplaced PMI.  Heart regular S1/S2, no S3/S4, 3/6 crescendo-decrescendo murmur LUSB with clear S2.  No peripheral edema.  No carotid bruit.    Normal pedal pulses.  Abdomen: Soft, nontender, no hepatosplenomegaly, no distention.  Neurologic: Alert and oriented x 3.  Psych: Normal affect. Extremities: No clubbing or cyanosis.   TELEMETRY: Reviewed telemetry pt in atrial fibrillation in the 70s  ASSESSMENT AND PLAN:  79 yo with history of chronic atrial fibrillation on coumadin, CAD s/p PTCA in 1980s and 1990s, PAD (aortobifemoral bypass), AAA repair, and mild AS presented to the ER with worrisome chest pain.  1. Chest pain: Prolonged pain beginning with exertion is concerning for possible unstable angina. He is currently pain-free with negative cardiac enzymes and non-acute ECG.  - Treat with ASA. I replaced verapamil with Toprol XL 50 mg bid. Continue atorvastatin.  - NTG paste 1 inch  - I think that LHC may be the best course here given his prior history and the concerning nature of the chest pain.  He is agreeable to this.  INR  1.59 today so ok for cath today.  Would use radial access given history of aortobifemoral bypass (from last cardiology note though unable to locate vascular notes). He says that his last cath from groin was difficult. 2. Atrial fibrillation: chronic, rate controlled. As above, holding warfarin for cath. Toprol XL for rate control.  Restart warfarin after cath without bridging.  3. Murmur: Mild aortic stenosis by recent echo.  4. Diastolic CHF: Mild elevation in JVP but no significant exertional dyspnea. BNP elevated. EF normal on echo in 3/14.  He will need to be careful with sodium, at the very least should restart HCTZ (had not been taking at home).  5. Thrombocytopenia: Mild, chronic.   If no intervention, he can go home from holding after cath.   Sinclair Alligood 05/24/2012 7:26 AM     

## 2012-05-24 NOTE — H&P (View-Only) (Signed)
Patient ID: Tyler Williams, male   DOB: 1926/11/27, 79 y.o.   MRN: [id number]     SUBJECTIVE: No further chest pain, uneventful night.   Marland Kitchen aspirin EC  81 mg Oral Daily  . atorvastatin  20 mg Oral q1800  . ezetimibe  10 mg Oral q1800  . hydrochlorothiazide  12.5 mg Oral Daily  . metoprolol succinate  50 mg Oral BID  . nitroGLYCERIN  1 inch Topical Q6H  . omega-3 acid ethyl esters  1 g Oral Daily  . psyllium  1 packet Oral Daily  . ramipril  10 mg Oral BID  . sodium chloride  3 mL Intravenous Q12H  . sodium chloride  3 mL Intravenous Q12H  heparin gtt    Filed Vitals:   05/24/12 0000 05/24/12 0300 05/24/12 0400 05/24/12 0528  BP: 154/83  127/62 137/68  Pulse: 81  74   Temp: 98.5 F (36.9 C)  98.7 F (37.1 C)   TempSrc: Oral  Oral   Resp: 23  25 18   Height:      Weight:  191 lb 2.2 oz (86.7 kg)    SpO2: 95%  95%     Intake/Output Summary (Last 24 hours) at 05/24/12 0726 Last data filed at 05/24/12 0600  Gross per 24 hour  Intake 1092.42 ml  Output   2010 ml  Net -917.58 ml    LABS: Basic Metabolic Panel:  Recent Labs  62/95/28 0630 05/24/12 0330  NA 133* 131*  K 4.3 3.8  CL 100 98  CO2 23 25  GLUCOSE 94 97  BUN 15 13  CREATININE 0.76 0.83  CALCIUM 8.4 8.3*  INR 2 BNP 981  Liver Function Tests: No results found for this basename: AST, ALT, ALKPHOS, BILITOT, PROT, ALBUMIN,  in the last 72 hours No results found for this basename: LIPASE, AMYLASE,  in the last 72 hours CBC:  Recent Labs  05/23/12 0630 05/24/12 0330  WBC 14.0* 10.8*  HGB 14.8 14.5  HCT 43.1 41.3  MCV 91.1 90.8  PLT 125* 125*   Cardiac Enzymes:  Recent Labs  05/22/12 1857 05/23/12 0011 05/23/12 0630  TROPONINI <0.30 <0.30 <0.30   BNP: No components found with this basename: POCBNP,  D-Dimer: No results found for this basename: DDIMER,  in the last 72 hours Hemoglobin A1C: No results found for this basename: HGBA1C,  in the last 72 hours Fasting Lipid Panel:  Recent  Labs  05/23/12 0630  CHOL 112  HDL 49  LDLCALC 52  TRIG 56  CHOLHDL 2.3   Thyroid Function Tests: No results found for this basename: TSH, T4TOTAL, FREET3, T3FREE, THYROIDAB,  in the last 72 hours Anemia Panel: No results found for this basename: VITAMINB12, FOLATE, FERRITIN, TIBC, IRON, RETICCTPCT,  in the last 72 hours  RADIOLOGY: Dg Chest Port 1 View  05/22/2012  *RADIOLOGY REPORT*  Clinical Data: Chest pain  PORTABLE CHEST - 1 VIEW  Comparison: 05/09/2011  Findings: The heart size is normal.  There are no pleural effusions or interstitial edema.  The lung volumes are low.  No airspace consolidation.  IMPRESSION:  1.  Low lung volumes.   Original Report Authenticated By: Signa Kell, M.D.     PHYSICAL EXAM General: NAD Neck: JVP 8-9 cm, no thyromegaly or thyroid nodule.  Lungs: Clear to auscultation bilaterally with normal respiratory effort. CV: Nondisplaced PMI.  Heart regular S1/S2, no S3/S4, 3/6 crescendo-decrescendo murmur LUSB with clear S2.  No peripheral edema.  No carotid bruit.  Normal pedal pulses.  Abdomen: Soft, nontender, no hepatosplenomegaly, no distention.  Neurologic: Alert and oriented x 3.  Psych: Normal affect. Extremities: No clubbing or cyanosis.   TELEMETRY: Reviewed telemetry pt in atrial fibrillation in the 70s  ASSESSMENT AND PLAN:  79 yo with history of chronic atrial fibrillation on coumadin, CAD s/p PTCA in 1980s and 1990s, PAD (aortobifemoral bypass), AAA repair, and mild AS presented to the ER with worrisome chest pain.  1. Chest pain: Prolonged pain beginning with exertion is concerning for possible unstable angina. He is currently pain-free with negative cardiac enzymes and non-acute ECG.  - Treat with ASA. I replaced verapamil with Toprol XL 50 mg bid. Continue atorvastatin.  - NTG paste 1 inch  - I think that LHC may be the best course here given his prior history and the concerning nature of the chest pain.  He is agreeable to this.  INR  1.59 today so ok for cath today.  Would use radial access given history of aortobifemoral bypass (from last cardiology note though unable to locate vascular notes). He says that his last cath from groin was difficult. 2. Atrial fibrillation: chronic, rate controlled. As above, holding warfarin for cath. Toprol XL for rate control.  Restart warfarin after cath without bridging.  3. Murmur: Mild aortic stenosis by recent echo.  4. Diastolic CHF: Mild elevation in JVP but no significant exertional dyspnea. BNP elevated. EF normal on echo in 3/14.  He will need to be careful with sodium, at the very least should restart HCTZ (had not been taking at home).  5. Thrombocytopenia: Mild, chronic.   If no intervention, he can go home from holding after cath.   Marca Ancona 05/24/2012 7:26 AM

## 2012-05-25 DIAGNOSIS — I2510 Atherosclerotic heart disease of native coronary artery without angina pectoris: Principal | ICD-10-CM

## 2012-05-25 LAB — BASIC METABOLIC PANEL
BUN: 17 mg/dL (ref 6–23)
CO2: 25 mEq/L (ref 19–32)
Calcium: 8.8 mg/dL (ref 8.4–10.5)
Chloride: 103 mEq/L (ref 96–112)
Creatinine, Ser: 0.93 mg/dL (ref 0.50–1.35)
GFR calc Af Amer: 87 mL/min — ABNORMAL LOW (ref >90)
GFR calc non Af Amer: 75 mL/min — ABNORMAL LOW (ref >90)
Glucose, Bld: 91 mg/dL (ref 70–99)
Potassium: 4 mEq/L (ref 3.5–5.1)
Sodium: 136 mEq/L (ref 135–145)

## 2012-05-25 LAB — CBC
HCT: 42.8 % (ref 39.0–52.0)
Hemoglobin: 14.9 g/dL (ref 13.0–17.0)
MCH: 31 pg (ref 26.0–34.0)
MCHC: 34.8 g/dL (ref 30.0–36.0)
MCV: 89 fL (ref 78.0–100.0)
Platelets: 135 10*3/uL — ABNORMAL LOW (ref 150–400)
RBC: 4.81 MIL/uL (ref 4.22–5.81)
RDW: 14.4 % (ref 11.5–15.5)
WBC: 9.2 10*3/uL (ref 4.0–10.5)

## 2012-05-25 LAB — PROTIME-INR
INR: 1.17 (ref 0.00–1.49)
Prothrombin Time: 14.7 seconds (ref 11.6–15.2)

## 2012-05-25 MED ORDER — WARFARIN SODIUM 5 MG PO TABS
5.0000 mg | ORAL_TABLET | Freq: Once | ORAL | Status: DC
  Filled 2012-05-25: qty 1

## 2012-05-25 MED ORDER — NITROGLYCERIN 0.4 MG SL SUBL: 0.4000 mg | SUBLINGUAL_TABLET | SUBLINGUAL | Status: DC | PRN

## 2012-05-25 MED ORDER — ISOSORBIDE MONONITRATE ER 60 MG PO TB24
60.0000 mg | ORAL_TABLET | Freq: Every day | ORAL | Status: DC
  Administered 2012-05-25: 60 mg via ORAL
  Filled 2012-05-25: qty 1

## 2012-05-25 MED ORDER — ISOSORBIDE MONONITRATE ER 60 MG PO TB24: 60.0000 mg | ORAL_TABLET | Freq: Every day | ORAL | Status: DC

## 2012-05-25 MED ORDER — METOPROLOL SUCCINATE ER 50 MG PO TB24: 50.0000 mg | ORAL_TABLET | Freq: Two times a day (BID) | ORAL | Status: DC

## 2012-05-25 MED ORDER — ASPIRIN 81 MG PO CHEW: CHEWABLE_TABLET | ORAL | Status: DC

## 2012-05-25 NOTE — Progress Notes (Signed)
ANTICOAGULATION CONSULT NOTE - Follow Up Consult  Pharmacy Consult for coumadin Indication: atrial fibrillation  No Known Allergies  Patient Measurements: Height: 5' 9.5" (176.5 cm) Weight: 190 lb 7.6 oz (86.4 kg) IBW/kg (Calculated) : 71.85   Vital Signs: Temp: 98 F (36.7 C) (04/12 0440) Temp src: Oral (04/12 0440) BP: 169/80 mmHg (04/12 0440) Pulse Rate: 71 (04/12 0440)  Labs:  Recent Labs  05/22/12 1857 05/23/12 0011 05/23/12 0630 05/23/12 1949 05/24/12 0330 05/25/12 0545  HGB  --   --  14.8  --  14.5 14.9  HCT  --   --  43.1  --  41.3 42.8  PLT  --   --  125*  --  125* 135*  LABPROT  --   --  22.0*  --  18.5* 14.7  INR  --   --  2.01*  --  1.59* 1.17  HEPARINUNFRC  --   --   --  <0.10* 0.39  --   CREATININE  --   --  0.76  --  0.83 0.93  TROPONINI <0.30 <0.30 <0.30  --   --   --     Estimated Creatinine Clearance: 65 ml/min (by C-G formula based on Cr of 0.93).   Medications:  Scheduled:  . aspirin  81 mg Oral Daily  . atorvastatin  20 mg Oral q1800  . [COMPLETED] bivalirudin      . [COMPLETED] bivalirudin (ANGIOMAX) infusion 5 mg/mL (Cath Lab,ACS,PCI indication)  0.25 mg/kg/hr Intravenous To Cath  . [COMPLETED] clopidogrel      . ezetimibe  10 mg Oral q1800  . [COMPLETED] famotidine      . [COMPLETED] heparin      . [COMPLETED] heparin      . [COMPLETED] heparin      . hydrochlorothiazide  12.5 mg Oral Daily  . isosorbide mononitrate  30 mg Oral Daily  . [COMPLETED] lidocaine (PF)      . metoprolol succinate  50 mg Oral BID  . [COMPLETED] midazolam      . [COMPLETED] midazolam      . omega-3 acid ethyl esters  1 g Oral Daily  . psyllium  1 packet Oral Daily  . ramipril  10 mg Oral BID  . sodium chloride  3 mL Intravenous Q12H  . [COMPLETED] verapamil      . [COMPLETED] warfarin  5 mg Oral ONCE-1800  . Warfarin - Pharmacist Dosing Inpatient   Does not apply q1800  . [DISCONTINUED] aspirin EC  81 mg Oral Daily  . [DISCONTINUED] nitroGLYCERIN  1  inch Topical Q6H  . [DISCONTINUED] sodium chloride  3 mL Intravenous Q12H   Infusions:  . [EXPIRED] sodium chloride 75 mL/hr at 05/24/12 1200  . [DISCONTINUED] sodium chloride 1 mL/kg/hr (05/24/12 0352)  . [DISCONTINUED] heparin 1,600 Units/hr (05/24/12 0809)    Assessment: 79 yo male on coumadin for afib . INR today is 1.17. Coumadin restarted 4/11 s/p attempted cath. PTA  Regimen: coumadin 5mg  po qday, except 7.5mg  po on Mondays.  Admitting INR was therapeutic.   Goal of Therapy:  INR 2-3    Plan:  1) Coumadin 5mg  po x1 2) INR in am  Bola A. Wandra Feinstein D Clinical Pharmacist Pager:[phone] Phone [phone] 05/25/2012 7:26 AM

## 2012-05-25 NOTE — Discharge Summary (Signed)
See dictated discharge summary

## 2012-05-25 NOTE — Discharge Summary (Signed)
CARDIOLOGY DISCHARGE SUMMARY    Patient ID: Tyler Williams,  MRN: [id number], DOB/AGE: August 16, 1926 79 y.o.  Admit date: 05/22/2012 Discharge date: 05/25/2012  Primary Care Physician: Geoffry Paradise, MD Primary Cardiologist: previously Riley Kill, MD, now Excell Seltzer, MD  Primary Discharge Diagnosis:  1. CAD - single vessel with severe stenosis of mid-RCA. Unable to open RCA. Continue medical therapy. Imdur added.  Secondary Discharge Diagnoses:  1. Atrial fibrillation: chronic, rate controlled  2. Mild aortic stenosis - mean gradient 17 mmHg by echo March 2014 3. Chronic diastolic HF 4. Thrombocytopenia: mild, chronic 5. PAD - AAA s/p repair 1993 6. GERD 7. Macular degeneration  Procedures This Admission:  1. Cardiac catheterization 05/24/2012  Hemodynamic Findings:  Central aortic pressure: 103/59  Left ventricular pressure: 111/13/15  Angiographic Findings:  Left main: No obstructive disease.  Left Anterior Descending Artery: Large caliber vessel that courses to the apex. There is mild plaque in the proximal, mid and distal vessel has mild plaque disease. The first diagonal branch is moderate in caliber and has 20% proximal stenosis.  Circumflex Artery: Moderate caliber vessel with ostial 20% stenosis, 30% mid stenosis. Intermediate branch is small with ostial 30% stenosis. The first OM branch is moderate in caliber with mild plaque disease. The distal AV groove Circumflex has a 30% stenosis.  Right Coronary Artery: Large, dominant vessel with severe calcification throughout the proximal and mid vessel. The proximal vessel has serial 30-40% stenoses. The mid vessel has tandem 99% stenoses, severely calcified. The distal vessel has a 30% stenosis. The PDA is patent with mild plaque disease. The posterolateral branch is small with diffuse 40% stenosis.  Left Ventricular Angiogram: Deferred.  Impression:  1. Single vessel CAD  2. Unstable angina secondary to severe stenosis mid RCA. This  lesion is heavily calcified with tandem 99% stenoses.  3. Unsuccessful attempted PCI of the mid RCA. ( As above, unable to pass balloon or transit catheter across mid stenosis due to calcification. Unable to pass Rotablator wire distally so Rotablator atherectomy unable to be completed.)  4. No good options for PCI.  Recommendations: As above, unable to open the severe lesions in the mid RCA as we could not pass a balloon or a Rotablator wire distally. I am not sure there are any other options for PCI. Would attempt medical management for now. Will watch overnight. Will restart coumadin tonight. D/C Plavix. Resume ASA 81 mg po daily.   History and Hospital Course:  Tyler Williams is an 79 year old man with chronic atrial fibrillation on Coumadin, CAD s/p PTCA in 1980s and 1990s, PAD, AAA repair, and mild AS who presented to the ED with chest pain on 05/22/2012. His symptoms were concerning for [place] and cardiac catheterization was recommended. His Coumadin was held in preparation for the procedure. Once his INR <1.8, he underwent a cardiac catheterization on 05/24/2012 with details as outlined above. He has single vessel CAD involving the RCA which was not amenable to PCI; therefore, medical therapy was recommended. Tyler Williams tolerated this procedure well without any immediate complication. He remains hemodynamically stable and afebrile. His right wrist/radial access site is intact without significant bleeding or hematoma. His verapamil was discontinued in favor of Toprol XL. Aspirin and Imdur were added. Lipitor was continued. Coumadin was restarted post cath without bridging. He has been ambulating without difficulty. Telemetry shows sinus rhythm without arrhythmias. He has been given discharge instructions including wound care and activity restrictions. He will follow-up with Tereso Newcomer, PA-C in clinic in  2 weeks. He has been seen, examined and deemed stable for discharge today by Dr. Cassell Clement.  Discharge Vitals: Blood pressure 176/71, pulse 83, temperature 98.7 F (37.1 C), temperature source Oral, resp. rate 18, height 5' 9.5" (1.765 m), weight 190 lb 7.6 oz (86.4 kg), SpO2 98.00%.   Labs: Lab Results  Component Value Date   WBC 9.2 05/25/2012   HGB 14.9 05/25/2012   HCT 42.8 05/25/2012   MCV 89.0 05/25/2012   PLT 135* 05/25/2012     Recent Labs Lab 05/25/12 0545  NA 136  K 4.0  CL 103  CO2 25  BUN 17  CREATININE 0.93  CALCIUM 8.8  GLUCOSE 91   Lab Results  Component Value Date   TROPONINI <0.30 05/23/2012    Lab Results  Component Value Date   CHOL 112 05/23/2012   CHOL 125 11/26/2006   Lab Results  Component Value Date   HDL 49 05/23/2012   HDL 41.0 11/26/2006   Lab Results  Component Value Date   LDLCALC 52 05/23/2012   LDLCALC 63 11/26/2006   Lab Results  Component Value Date   TRIG 56 05/23/2012   TRIG 103 11/26/2006   Lab Results  Component Value Date   CHOLHDL 2.3 05/23/2012   CHOLHDL 3.0 CALC 11/26/2006    Recent Labs  05/25/12 0545  INR 1.17    Disposition:  The patient is being discharged in stable condition.  Follow-up:     Follow-up Information   Follow up with Tereso Newcomer, PA-C On 06/11/2012. (At 9:50 AM for hospital follow-up)    Contact information:   [address] [place] [address] [phone]      Follow up with [facility] On 05/27/2012. (At 2:45 PM for Coumadin follow-up (INR check))    Contact information:   [address] [place] [address] [phone]     Discharge Medications:    Medication List    STOP taking these medications       verapamil 240 MG CR tablet  Commonly known as:  CALAN-SR      TAKE these medications       aspirin 81 MG chewable tablet  Take 1 tablet (81 mg) by mouth once daily.     atorvastatin 20 MG tablet  Commonly known as:  LIPITOR  Take 20 mg by mouth every evening.     Co Q 10 100 MG Caps  Take 1  capsule by mouth 2 (two) times daily.     ezetimibe 10 MG tablet  Commonly known as:  ZETIA  Take 10 mg by mouth every evening.     hydrochlorothiazide 12.5 MG capsule  Commonly known as:  MICROZIDE  Take 12.5 mg by mouth daily before breakfast.     isosorbide mononitrate 60 MG 24 hr tablet  Commonly known as:  IMDUR  Take 1 tablet (60 mg total) by mouth daily.     LORazepam 1 MG tablet  Commonly known as:  ATIVAN  Take 0.5-1 mg by mouth every 8 (eight) hours as needed for anxiety.     LUCENTIS 0.5 MG/0.05ML Soln  Generic drug:  ranibizumab  Inject 0.5 mg into the eye See admin instructions. Pt gets this injection every 9 weeks. Next treatment is due on 01-18-12. Pt is followed by [facility].  Gets 1 shot at a time, one week apart.     METAMUCIL 30.9 % Powd  Generic drug:  Psyllium  Take 1 packet by mouth at bedtime.     metoprolol succinate 50 MG 24 hr tablet  Commonly known as:  TOPROL-XL  Take 1 tablet (50 mg total) by mouth 2 (two) times daily. Take with or immediately following a meal.     nitroGLYCERIN 0.4 MG SL tablet  Commonly known as:  NITROSTAT  Place 1 tablet (0.4 mg total) under the tongue every 5 (five) minutes x 3 doses as needed for chest pain.     omega-3 acid ethyl esters 1 G capsule  Commonly known as:  LOVAZA  Take 1 g by mouth daily.     ramipril 10 MG capsule  Commonly known as:  ALTACE  Take 1 capsule (10 mg total) by mouth 2 (two) times daily.     Vitamin D-3 5000 UNITS Tabs  Take 5,000 Units by mouth daily.     warfarin 5 MG tablet  Commonly known as:  COUMADIN  Take 5-7.5 mg by mouth daily. 1 tab daily except 1.5 tab daily on Mondays.       Duration of Discharge Encounter: Greater than 30 minutes including physician time.  Signed, Rick Duff, PA-C 05/25/2012, 9:25 AM

## 2012-05-25 NOTE — Progress Notes (Signed)
Subjective:  Doing well after cath. No chest pain. Rhythm atrial fib with controlled ventricular response. BP is still high systolic. Will increase imdur to 60 mg daily.  Objective:  Vital Signs in the last 24 hours: Temp:  [97.4 F (36.3 C)-98.7 F (37.1 C)] 98.7 F (37.1 C) (04/12 0731) Pulse Rate:  [69-83] 83 (04/12 0731) Resp:  [16-18] 18 (04/12 0731) BP: (132-176)/(62-97) 176/71 mmHg (04/12 0731) SpO2:  [96 %-99 %] 98 % (04/12 0731) Weight:  [190 lb 7.6 oz (86.4 kg)] 190 lb 7.6 oz (86.4 kg) (04/12 0040)  Intake/Output from previous day: 04/11 0701 - 04/12 0700 In: 498.9 [I.V.:498.9] Out: 1475 [Urine:1475] Intake/Output from this shift: Total I/O In: 240 [P.O.:240] Out: -   . aspirin  81 mg Oral Daily  . atorvastatin  20 mg Oral q1800  . ezetimibe  10 mg Oral q1800  . hydrochlorothiazide  12.5 mg Oral Daily  . isosorbide mononitrate  60 mg Oral Daily  . metoprolol succinate  50 mg Oral BID  . omega-3 acid ethyl esters  1 g Oral Daily  . psyllium  1 packet Oral Daily  . ramipril  10 mg Oral BID  . sodium chloride  3 mL Intravenous Q12H  . warfarin  5 mg Oral ONCE-1800  . Warfarin - Pharmacist Dosing Inpatient   Does not apply q1800      Physical Exam: The patient appears to be in no distress.  Head and neck exam reveals that the pupils are equal and reactive.  The extraocular movements are full.  There is no scleral icterus.  Mouth and pharynx are benign.  No lymphadenopathy.  No carotid bruits.  The jugular venous pressure is normal.  Thyroid is not enlarged or tender.  Chest is clear to percussion and auscultation.  No rales or rhonchi.  Expansion of the chest is symmetrical.  Heart reveals no abnormal lift or heave.  First and second heart sounds are normal.  There is no murmur gallop rub or click. Rhythm irregular   The abdomen is soft and nontender.  Bowel sounds are normoactive.  There is no hepatosplenomegaly or mass.  There are no abdominal bruits. The  left groin site is okay.  Extremities reveal no phlebitis or edema.  Pedal pulses are good.  There is no cyanosis or clubbing.  Neurologic exam is normal strength and no lateralizing weakness.  No sensory deficits.  Integument reveals no rash  Lab Results:  Recent Labs  05/24/12 0330 05/25/12 0545  WBC 10.8* 9.2  HGB 14.5 14.9  PLT 125* 135*    Recent Labs  05/24/12 0330 05/25/12 0545  NA 131* 136  K 3.8 4.0  CL 98 103  CO2 25 25  GLUCOSE 97 91  BUN 13 17  CREATININE 0.83 0.93    Recent Labs  05/23/12 0011 05/23/12 0630  TROPONINI <0.30 <0.30   Hepatic Function Panel No results found for this basename: PROT, ALBUMIN, AST, ALT, ALKPHOS, BILITOT, BILIDIR, IBILI,  in the last 72 hours  Recent Labs  05/23/12 0630  CHOL 112   No results found for this basename: PROTIME,  in the last 72 hours  Imaging: No results found.  Cardiac Studies: Telemetry shows atrial fib with controlled VR. Assessment/Plan:  1. 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 Unable to open RCA. Continue medical therapy. - Treat with ASA. I replaced verapamil with Toprol XL 50 mg bid. Continue atorvastatin. Have added Imdur.  2. Atrial fibrillation: chronic, rate  controlled.  . Toprol XL for rate control. Restart warfarin after cath without bridging.  3. Murmur: Mild aortic stenosis by recent echo.  4. Diastolic CHF: Mild elevation in JVP but no significant exertional dyspnea. BNP elevated. EF normal on echo in 3/14. He will need to be careful with sodium, at the very least should restart HCTZ (had not been taking at home).  5. Thrombocytopenia: Mild, chronic.   Okay for discharge today.    LOS: 3 days    Cassell Clement 05/25/2012, 8:36 AM

## 2012-05-25 NOTE — Progress Notes (Signed)
CARDIAC REHAB PHASE I   PRE:  Rate/Rhythm: 75 Afib  BP:  Supine: 142/94  Sitting:   Standing:    SaO2:  MODE:  Ambulation: 1000 ft   POST:  Rate/Rhythm: 102  BP:  Supine:   Sitting: 193/102  Standing:    SaO2:  2841-3244  Pt tolerated ambulation well without c/o of cp or SOB. BP up before and after walk. Pt to recliner after walk. Completed discharge education with pt. He voices understanding.Pt agrees to Outpt. CRP in [place], will send referral.  Melina Copa RN 05/25/2012 9:25 AM   75 Afib

## 2012-05-27 ENCOUNTER — Ambulatory Visit (INDEPENDENT_AMBULATORY_CARE_PROVIDER_SITE_OTHER): Payer: Medicare Other

## 2012-05-27 DIAGNOSIS — I4891 Unspecified atrial fibrillation: Secondary | ICD-10-CM | POA: Diagnosis not present

## 2012-05-27 LAB — POCT INR: INR: 1.5

## 2012-05-27 MED FILL — Sodium Chloride IV Soln 0.9%: INTRAVENOUS | Qty: 50 | Status: AC

## 2012-05-28 DIAGNOSIS — H35359 Cystoid macular degeneration, unspecified eye: Secondary | ICD-10-CM | POA: Diagnosis not present

## 2012-05-28 DIAGNOSIS — H353290 Exudative age-related macular degeneration, unspecified eye, stage unspecified: Secondary | ICD-10-CM | POA: Diagnosis not present

## 2012-05-28 DIAGNOSIS — H35059 Retinal neovascularization, unspecified, unspecified eye: Secondary | ICD-10-CM | POA: Diagnosis not present

## 2012-05-28 DIAGNOSIS — Z961 Presence of intraocular lens: Secondary | ICD-10-CM | POA: Diagnosis not present

## 2012-05-30 DIAGNOSIS — H524 Presbyopia: Secondary | ICD-10-CM | POA: Diagnosis not present

## 2012-05-30 DIAGNOSIS — H353290 Exudative age-related macular degeneration, unspecified eye, stage unspecified: Secondary | ICD-10-CM | POA: Diagnosis not present

## 2012-06-11 ENCOUNTER — Encounter: Payer: Self-pay | Admitting: Physician Assistant

## 2012-06-11 ENCOUNTER — Ambulatory Visit (INDEPENDENT_AMBULATORY_CARE_PROVIDER_SITE_OTHER): Payer: Medicare Other | Admitting: Physician Assistant

## 2012-06-11 ENCOUNTER — Ambulatory Visit (INDEPENDENT_AMBULATORY_CARE_PROVIDER_SITE_OTHER): Payer: Medicare Other

## 2012-06-11 VITALS — BP 130/70 | HR 74 | Ht 69.5 in | Wt 196.0 lb

## 2012-06-11 DIAGNOSIS — I4891 Unspecified atrial fibrillation: Secondary | ICD-10-CM | POA: Diagnosis not present

## 2012-06-11 DIAGNOSIS — I2510 Atherosclerotic heart disease of native coronary artery without angina pectoris: Secondary | ICD-10-CM

## 2012-06-11 DIAGNOSIS — I10 Essential (primary) hypertension: Secondary | ICD-10-CM

## 2012-06-11 DIAGNOSIS — I359 Nonrheumatic aortic valve disorder, unspecified: Secondary | ICD-10-CM | POA: Diagnosis not present

## 2012-06-11 DIAGNOSIS — E7800 Pure hypercholesterolemia, unspecified: Secondary | ICD-10-CM

## 2012-06-11 DIAGNOSIS — I350 Nonrheumatic aortic (valve) stenosis: Secondary | ICD-10-CM

## 2012-06-11 LAB — POCT INR: INR: 2.8

## 2012-06-11 NOTE — Patient Instructions (Addendum)
PLEASE FOLLOW UP WITH DR. Excell Seltzer 09/10/12 @ 10:15  NO CHANGES WERE MADE TODAY WITH MEDICATIONS

## 2012-06-11 NOTE — Progress Notes (Signed)
[address], [address] [place], [place]  [address] Phone: [phone] Fax:  [phone]  Date:  06/11/2012   ID:  Tyler Williams, DOB 1926/04/25, MRN [id number]  PCP:  Minda Meo, MD  Primary Cardiologist:  Dr.  Shawnie Pons => Dr. Tonny Bollman     History of Present Illness: Tyler Williams is a 79 y.o. male who returns for follow up after a recent admission 4/9-4/12 with chest pain concerning for unstable angina.  He has a hx of chronic atrial fibrillation on coumadin, CAD s/p PTCA in 1980s and 1990s, PAD, AAA repair, and mild AS.  Echo 04/22/12: EF 65-70%, mild aortic stenosis, mean gradient 17, mild MR, moderate LAE, PASP 32.  He presented to the hospital with chest pain. He ruled out for myocardial infarction. His calcium channel blocker was switched to a beta blocker. Cardiac catheterization was recommended. LHC 05/24/12:  pD1 20%, oCFX 20%, mCFX 30%, ostial Int Br 30%, distal AV groove CFX 30%, pRCA 30-40%, mRCA 99%, dRCA 30%, PL branch small with diffuse 40%. Attempt at PCI of the RCA was made.  However, this was unsuccessful.  Medical therapy was recommended.  Since discharge, he is doing well. He denies chest discomfort. He's been back [place] the driving range without difficulty. He denies significant dyspnea. He denies orthopnea, PND or edema. He denies syncope.  Labs (4/14):  K 4, creatinine 0.93, LDL 52, Hgb 14.9  Wt Readings from Last 3 Encounters:  06/11/12 196 lb (88.905 kg)  05/25/12 190 lb 7.6 oz (86.4 kg)  05/25/12 190 lb 7.6 oz (86.4 kg)     Past Medical History  Diagnosis Date  . CAD (coronary artery disease)     a. s/p multiple percutaneous prior coronary  artery interventions;  b.  LHC 05/24/12:  pD1 20%, oCFX 20%, mCFX 30%, ostial Int Br 30%, distal AV groove CFX 30%, pRCA 30-40%, mRCA 99%, dRCA 30%, PL branch small with diffuse 40% => attempt at RCA PCI unsuccessful => med Rx.  Marland Kitchen PAF (paroxysmal atrial fibrillation)   . PVD (peripheral vascular  disease)   . Hypercholesteremia   . SSS (sick sinus syndrome)   . Edema     lower extremity-right greater than left  . Hypertension   . GERD (gastroesophageal reflux disease)   . Cancer     hx of skin cancer   . Arthritis     knees   . Pulmonary embolism     1964 related to dislocated hip on right     Current Outpatient Prescriptions  Medication Sig Dispense Refill  . aspirin 81 MG chewable tablet Take 1 tablet (81 mg) by mouth once daily.      Marland Kitchen atorvastatin (LIPITOR) 20 MG tablet Take 20 mg by mouth every evening.      . Cholecalciferol (VITAMIN D-3) 5000 UNITS TABS Take 5,000 Units by mouth daily.       . Coenzyme Q10 (CO Q 10) 100 MG CAPS Take 1 capsule by mouth 2 (two) times daily.      Marland Kitchen ezetimibe (ZETIA) 10 MG tablet Take 10 mg by mouth every evening.      . hydrochlorothiazide (MICROZIDE) 12.5 MG capsule Take 12.5 mg by mouth daily before breakfast.      . isosorbide mononitrate (IMDUR) 60 MG 24 hr tablet Take 1 tablet (60 mg total) by mouth daily.  30 tablet  4  . LORazepam (ATIVAN) 1 MG tablet Take 0.5-1 mg by mouth every 8 (eight) hours  as needed for anxiety.       . metoprolol succinate (TOPROL-XL) 50 MG 24 hr tablet Take 1 tablet (50 mg total) by mouth 2 (two) times daily. Take with or immediately following a meal.  60 tablet  4  . nitroGLYCERIN (NITROSTAT) 0.4 MG SL tablet Place 1 tablet (0.4 mg total) under the tongue every 5 (five) minutes x 3 doses as needed for chest pain.  30 tablet  4  . omega-3 acid ethyl esters (LOVAZA) 1 G capsule Take 1 g by mouth daily.      . Psyllium (METAMUCIL) 30.9 % POWD Take 1 packet by mouth at bedtime.       . ramipril (ALTACE) 10 MG capsule Take 1 capsule (10 mg total) by mouth 2 (two) times daily.  60 capsule  5  . Ranibizumab (LUCENTIS) 0.5 MG/0.05ML SOLN Inject 0.5 mg into the eye See admin instructions. Pt gets this injection every 9 weeks. Next treatment is due on 01-18-12. Pt is followed by [facility].  Gets 1 shot  at a time, one week apart.      . warfarin (COUMADIN) 5 MG tablet Take 5-7.5 mg by mouth daily. 1 tab daily except 1.5 tab daily on Mondays.       No current facility-administered medications for this visit.    Allergies:   No Known Allergies  Social History:  The patient  reports that he quit smoking about 29 years ago. His smoking use included Cigarettes. He smoked 0.00 packs per day. He has never used smokeless tobacco. He reports that he drinks about 4.2 ounces of alcohol per week. He reports that he does not use illicit drugs.   ROS:  Please see the history of present illness.   He has a lot of upper airway congestion related to allergies. He's been using Claritin OTC.   All other systems reviewed and negative.   PHYSICAL EXAM: VS:  BP 130/70  Pulse 74  Ht 5' 9.5" (1.765 m)  Wt 196 lb (88.905 kg)  BMI 28.54 kg/m2  SpO2 98% Well nourished, well developed, in no acute distress HEENT: normal Neck: no JVD Cardiac:  normal S1, S2; irregularly irregular rhythm; 2/6 harsh systolic crescendo decrescendo murmur at the RUSB Lungs:  clear to auscultation bilaterally, no wheezing, rhonchi or rales Abd: soft, nontender, no hepatomegaly Ext: no edema; left groin without hematoma or bruit Skin: warm and dry Neuro:  CNs 2-12 intact, no focal abnormalities noted  EKG:  Atrial fibrillation, HR 74, right axis deviation, right bundle branch block     ASSESSMENT AND PLAN:  1. CAD: Doing well without further angina. Continue aspirin, statin, nitrate, beta blocker. 2. Aortic Stenosis: Mild by recent echocardiogram. He will require periodic followup echocardiogram for this. 3. Atrial Fibrillation: Rate controlled. Coumadin is managed by our Coumadin clinic. 4. Hypertension: Controlled. Continue current therapy. 5. Hyperlipidemia: Continue statin. 6. Disposition:  Follow up with Dr. Excell Seltzer in 3 months.  SignedTereso Newcomer, PA-C  10:09 AM 06/11/2012

## 2012-06-17 ENCOUNTER — Other Ambulatory Visit: Payer: Self-pay | Admitting: *Deleted

## 2012-06-17 MED ORDER — EZETIMIBE 10 MG PO TABS: 10.0000 mg | ORAL_TABLET | Freq: Every evening | ORAL | Status: DC

## 2012-06-20 DIAGNOSIS — I4891 Unspecified atrial fibrillation: Secondary | ICD-10-CM | POA: Diagnosis not present

## 2012-06-20 DIAGNOSIS — F411 Generalized anxiety disorder: Secondary | ICD-10-CM | POA: Diagnosis not present

## 2012-06-20 DIAGNOSIS — Z7901 Long term (current) use of anticoagulants: Secondary | ICD-10-CM | POA: Insufficient documentation

## 2012-06-20 DIAGNOSIS — I10 Essential (primary) hypertension: Secondary | ICD-10-CM | POA: Diagnosis not present

## 2012-06-20 DIAGNOSIS — F419 Anxiety disorder, unspecified: Secondary | ICD-10-CM | POA: Insufficient documentation

## 2012-06-20 DIAGNOSIS — IMO0002 Reserved for concepts with insufficient information to code with codable children: Secondary | ICD-10-CM | POA: Diagnosis not present

## 2012-06-25 DIAGNOSIS — H353290 Exudative age-related macular degeneration, unspecified eye, stage unspecified: Secondary | ICD-10-CM | POA: Diagnosis not present

## 2012-07-01 DIAGNOSIS — R82998 Other abnormal findings in urine: Secondary | ICD-10-CM | POA: Diagnosis not present

## 2012-07-01 DIAGNOSIS — Z125 Encounter for screening for malignant neoplasm of prostate: Secondary | ICD-10-CM | POA: Diagnosis not present

## 2012-07-01 DIAGNOSIS — I10 Essential (primary) hypertension: Secondary | ICD-10-CM | POA: Diagnosis not present

## 2012-07-01 DIAGNOSIS — E785 Hyperlipidemia, unspecified: Secondary | ICD-10-CM | POA: Diagnosis not present

## 2012-07-02 DIAGNOSIS — H353290 Exudative age-related macular degeneration, unspecified eye, stage unspecified: Secondary | ICD-10-CM | POA: Diagnosis not present

## 2012-07-12 DIAGNOSIS — H35359 Cystoid macular degeneration, unspecified eye: Secondary | ICD-10-CM | POA: Diagnosis not present

## 2012-07-12 DIAGNOSIS — Z961 Presence of intraocular lens: Secondary | ICD-10-CM | POA: Diagnosis not present

## 2012-07-12 DIAGNOSIS — H35059 Retinal neovascularization, unspecified, unspecified eye: Secondary | ICD-10-CM | POA: Diagnosis not present

## 2012-07-12 DIAGNOSIS — H353290 Exudative age-related macular degeneration, unspecified eye, stage unspecified: Secondary | ICD-10-CM | POA: Diagnosis not present

## 2012-07-15 DIAGNOSIS — I10 Essential (primary) hypertension: Secondary | ICD-10-CM | POA: Diagnosis not present

## 2012-07-15 DIAGNOSIS — Z1331 Encounter for screening for depression: Secondary | ICD-10-CM | POA: Diagnosis not present

## 2012-07-15 DIAGNOSIS — Z6828 Body mass index (BMI) 28.0-28.9, adult: Secondary | ICD-10-CM | POA: Diagnosis not present

## 2012-07-15 DIAGNOSIS — I4891 Unspecified atrial fibrillation: Secondary | ICD-10-CM | POA: Diagnosis not present

## 2012-07-15 DIAGNOSIS — M1990 Unspecified osteoarthritis, unspecified site: Secondary | ICD-10-CM | POA: Diagnosis not present

## 2012-07-15 DIAGNOSIS — E785 Hyperlipidemia, unspecified: Secondary | ICD-10-CM | POA: Diagnosis not present

## 2012-07-15 DIAGNOSIS — Z125 Encounter for screening for malignant neoplasm of prostate: Secondary | ICD-10-CM | POA: Diagnosis not present

## 2012-07-15 DIAGNOSIS — I2510 Atherosclerotic heart disease of native coronary artery without angina pectoris: Secondary | ICD-10-CM | POA: Diagnosis not present

## 2012-07-15 DIAGNOSIS — I739 Peripheral vascular disease, unspecified: Secondary | ICD-10-CM | POA: Diagnosis not present

## 2012-07-15 DIAGNOSIS — Z0000 Encounter for general adult medical examination without abnormal findings: Secondary | ICD-10-CM | POA: Diagnosis not present

## 2012-07-18 ENCOUNTER — Other Ambulatory Visit: Payer: Self-pay | Admitting: *Deleted

## 2012-07-18 MED ORDER — HYDROCHLOROTHIAZIDE 12.5 MG PO CAPS: 12.5000 mg | ORAL_CAPSULE | Freq: Every day | ORAL | Status: DC

## 2012-07-22 DIAGNOSIS — Z96659 Presence of unspecified artificial knee joint: Secondary | ICD-10-CM | POA: Diagnosis not present

## 2012-07-23 ENCOUNTER — Ambulatory Visit (INDEPENDENT_AMBULATORY_CARE_PROVIDER_SITE_OTHER): Payer: Medicare Other | Admitting: *Deleted

## 2012-07-23 DIAGNOSIS — I4891 Unspecified atrial fibrillation: Secondary | ICD-10-CM

## 2012-07-23 LAB — POCT INR: INR: 2.5

## 2012-07-30 DIAGNOSIS — H353290 Exudative age-related macular degeneration, unspecified eye, stage unspecified: Secondary | ICD-10-CM | POA: Diagnosis not present

## 2012-08-13 DIAGNOSIS — H353290 Exudative age-related macular degeneration, unspecified eye, stage unspecified: Secondary | ICD-10-CM | POA: Diagnosis not present

## 2012-08-18 ENCOUNTER — Other Ambulatory Visit: Payer: Self-pay | Admitting: Cardiology

## 2012-09-03 DIAGNOSIS — H353290 Exudative age-related macular degeneration, unspecified eye, stage unspecified: Secondary | ICD-10-CM | POA: Diagnosis not present

## 2012-09-03 DIAGNOSIS — H35059 Retinal neovascularization, unspecified, unspecified eye: Secondary | ICD-10-CM | POA: Diagnosis not present

## 2012-09-03 DIAGNOSIS — H35359 Cystoid macular degeneration, unspecified eye: Secondary | ICD-10-CM | POA: Diagnosis not present

## 2012-09-09 ENCOUNTER — Ambulatory Visit (INDEPENDENT_AMBULATORY_CARE_PROVIDER_SITE_OTHER): Payer: Medicare Other | Admitting: Pharmacist

## 2012-09-09 ENCOUNTER — Ambulatory Visit: Payer: Medicare Other | Admitting: Cardiovascular Disease

## 2012-09-09 DIAGNOSIS — I4891 Unspecified atrial fibrillation: Secondary | ICD-10-CM

## 2012-09-09 LAB — POCT INR: INR: 3.1

## 2012-09-10 ENCOUNTER — Ambulatory Visit: Payer: Medicare Other | Admitting: Cardiovascular Disease

## 2012-09-10 DIAGNOSIS — H35059 Retinal neovascularization, unspecified, unspecified eye: Secondary | ICD-10-CM | POA: Diagnosis not present

## 2012-09-10 DIAGNOSIS — Z961 Presence of intraocular lens: Secondary | ICD-10-CM | POA: Diagnosis not present

## 2012-09-10 DIAGNOSIS — H35359 Cystoid macular degeneration, unspecified eye: Secondary | ICD-10-CM | POA: Diagnosis not present

## 2012-09-10 DIAGNOSIS — H353290 Exudative age-related macular degeneration, unspecified eye, stage unspecified: Secondary | ICD-10-CM | POA: Diagnosis not present

## 2012-09-18 ENCOUNTER — Other Ambulatory Visit: Payer: Self-pay

## 2012-09-19 ENCOUNTER — Other Ambulatory Visit: Payer: Self-pay | Admitting: Cardiology

## 2012-09-20 ENCOUNTER — Ambulatory Visit (INDEPENDENT_AMBULATORY_CARE_PROVIDER_SITE_OTHER): Payer: Medicare Other | Admitting: Cardiovascular Disease

## 2012-09-20 ENCOUNTER — Encounter: Payer: Self-pay | Admitting: Cardiovascular Disease

## 2012-09-20 VITALS — BP 154/67 | HR 68 | Temp 96.8°F | Ht 69.5 in | Wt 197.0 lb

## 2012-09-20 DIAGNOSIS — I4891 Unspecified atrial fibrillation: Secondary | ICD-10-CM | POA: Diagnosis not present

## 2012-09-20 DIAGNOSIS — I2510 Atherosclerotic heart disease of native coronary artery without angina pectoris: Secondary | ICD-10-CM

## 2012-09-20 DIAGNOSIS — I359 Nonrheumatic aortic valve disorder, unspecified: Secondary | ICD-10-CM | POA: Diagnosis not present

## 2012-09-20 MED ORDER — VERAPAMIL HCL ER 240 MG PO TBCR: 240.0000 mg | EXTENDED_RELEASE_TABLET | ORAL | Status: DC

## 2012-09-20 NOTE — Patient Instructions (Addendum)
Your physician recommends that you schedule a follow-up appointment in: 3 MONTHS WITH DR COOPER  STOP METOPROLOL  START VERAPAMIL SR 240 MG EVERY MORNING

## 2012-09-20 NOTE — Progress Notes (Signed)
HPI:   79 year old gentleman with an extensive cardiac history, returning for followup evaluation. The patient has been followed by Dr. Riley Kill. He has chronic atrial fibrillation on warfarin. He has coronary artery disease with history of PTCA remotely in the 1980s and 90s. He presented in April of this year with chest pain, question of unstable angina. He was found to have severe stenosis of the right coronary artery and PCI was attempted unsuccessfully. Medical therapy was recommended. The patient also has mild aortic stenosis with mean gradient of 17 mm mercury in March.   The patient is doing well at the present time. He has noted increased fatigue since he was changed from for abdominal to metoprolol succinate. Otherwise he has no specific complaints. He plays golf regularly. He denies exertional chest pain or pressure. He denies lightheadedness, leg swelling, edema, or palpitations. He's had no bleeding problems on warfarin.  Outpatient Encounter Prescriptions as of 09/20/2012  Medication Sig Dispense Refill  . aspirin 81 MG chewable tablet Take 1 tablet (81 mg) by mouth once daily.      Marland Kitchen atorvastatin (LIPITOR) 20 MG tablet Take 20 mg by mouth every evening.      . Cholecalciferol (VITAMIN D-3) 5000 UNITS TABS Take 5,000 Units by mouth daily.       . Coenzyme Q10 (CO Q 10) 100 MG CAPS Take 1 capsule by mouth 2 (two) times daily.      Marland Kitchen COUMADIN 5 MG tablet TAKE AS DIRECTED BY ANTICOAGULATION CLINIC. (#40=30 DAY SUPPLY)  40 tablet  3  . hydrochlorothiazide (MICROZIDE) 12.5 MG capsule Take 1 capsule (12.5 mg total) by mouth daily before breakfast.  30 capsule  6  . isosorbide mononitrate (IMDUR) 60 MG 24 hr tablet Take 1 tablet (60 mg total) by mouth daily.  30 tablet  4  . LORazepam (ATIVAN) 1 MG tablet Take 0.5-1 mg by mouth every 8 (eight) hours as needed for anxiety.       . nitroGLYCERIN (NITROSTAT) 0.4 MG SL tablet Place 1 tablet (0.4 mg total) under the tongue every 5 (five) minutes x 3  doses as needed for chest pain.  30 tablet  4  . omega-3 acid ethyl esters (LOVAZA) 1 G capsule Take 1 g by mouth daily.      . Psyllium (METAMUCIL) 30.9 % POWD Take 1 packet by mouth at bedtime.       . ramipril (ALTACE) 10 MG capsule TAKE (1) CAPSULE TWICE DAILY.  60 capsule  2  . Ranibizumab (LUCENTIS) 0.5 MG/0.05ML SOLN Inject 0.5 mg into the eye See admin instructions. Pt gets this injection every 9 weeks. Next treatment is due on 01-18-12. Pt is followed by [facility].  Gets 1 shot at a time, one week apart.      Marland Kitchen ZETIA 10 MG tablet TAKE 1 TABLET IN THE P.M.  30 tablet  6  . [DISCONTINUED] metoprolol succinate (TOPROL-XL) 50 MG 24 hr tablet Take 1 tablet (50 mg total) by mouth 2 (two) times daily. Take with or immediately following a meal.  60 tablet  4  . verapamil (CALAN-SR) 240 MG CR tablet Take 1 tablet (240 mg total) by mouth every morning.  90 tablet  4   No facility-administered encounter medications on file as of 09/20/2012.    No Known Allergies  Past Medical History  Diagnosis Date  . CAD (coronary artery disease)     a. s/p multiple percutaneous prior coronary  artery interventions;  b.  LHC 05/24/12:  pD1 20%, oCFX 20%, mCFX 30%, ostial Int Br 30%, distal AV groove CFX 30%, pRCA 30-40%, mRCA 99%, dRCA 30%, PL branch small with diffuse 40% => attempt at RCA PCI unsuccessful => med Rx.  Marland Kitchen PAF (paroxysmal atrial fibrillation)   . PVD (peripheral vascular disease)   . Hypercholesteremia   . SSS (sick sinus syndrome)   . Edema     lower extremity-right greater than left  . Hypertension   . GERD (gastroesophageal reflux disease)   . Cancer     hx of skin cancer   . Arthritis     knees   . Pulmonary embolism     1964 related to dislocated hip on right     ROS: Negative except as per HPI  BP 154/67  Pulse 68  Temp(Src) 96.8 F (36 C)  Ht 5' 9.5" (1.765 m)  Wt 89.359 kg (197 lb)  BMI 28.68 kg/m2  SpO2 96%  PHYSICAL EXAM: Pt is alert and oriented,  pleasant elderly male in NAD HEENT: normal Neck: JVP - normal, carotids 2+= with bilateral bruits Lungs: CTA bilaterally CV: Irregular with grade 2/6 systolic ejection murmur at the right upper sternal border Abd: soft, NT, Positive BS, no hepatomegaly Ext: no C/C/E, distal pulses intact and equal Skin: warm/dry no rash  2D Echo: Left ventricle: The cavity size was normal. Wall thickness was normal. Systolic function was vigorous. The estimated ejection fraction was in the range of 65% to 70%.  ------------------------------------------------------------ Aortic valve: AV is thickened, calcified with mildly restricted motion. Peak and mean gradients through the valve are 27 and 17 mm Hg respectively. Moderately thickened, moderately calcified leaflets. Doppler: VTI ratio of LVOT to aortic valve: 0.34. Valve area: 1.53cm^2(VTI). Indexed valve area: 0.75cm^2/m^2 (VTI). Peak velocity ratio of LVOT to aortic valve: 0.35. Valve area: 1.6cm^2 (Vmax). Indexed valve area: 0.79cm^2/m^2 (Vmax). Mean gradient: 17mm Hg (S). Peak gradient: 27mm Hg (S).  ------------------------------------------------------------ Mitral valve: Calcified annulus. Mildly thickened leaflets . Doppler: Mild regurgitation.  ------------------------------------------------------------ Left atrium: The atrium was moderately dilated.  ------------------------------------------------------------ Right ventricle: The cavity size was normal. Wall thickness was normal. Systolic function was normal.  ------------------------------------------------------------ Pulmonic valve: Structurally normal valve. Cusp separation was normal. Doppler: Transvalvular velocity was within the normal range. Trivial regurgitation.  ------------------------------------------------------------ Tricuspid valve: Structurally normal valve. Leaflet separation was normal. Doppler: Transvalvular velocity was within the normal range. No  regurgitation.  ------------------------------------------------------------ Right atrium: The atrium was mildly dilated.  ------------------------------------------------------------ Pericardium: There was no pericardial effusion.  ------------------------------------------------------------ Systemic veins: Inferior vena cava: The vessel was dilated; the respirophasic diameter changes were blunted (< 50%); findings are consistent with elevated central venous pressure.  ------------------------------------------------------------  2D measurements Normal Doppler measurements Norma Left ventricle l LVID ED, 45.9 mm 43-52 Main pulmonary artery chord, Pressure, 32 mm Hg =30 PLAX S LVID ES, 30.3 mm 23-38 LVOT chord, Peak vel, 92. cm/s ----- PLAX S 5 FS, 34 % >29 VTI, S 17. cm ----- chord, 6 PLAX Aortic valve LVPW, ED 11.5 mm ------ Peak vel, 261 cm/s ----- IVS/LVPW 1.04 <1.3 S ratio, ED Mean vel, 196 cm/s ----- Ventricular septum S IVS, ED 12 mm ------ VTI, S 51. cm ----- LVOT 9 Diam, S 24 mm ------ Mean 17 mm Hg ----- Area 4.52 cm^2 ------ gradient, Aorta S Root 31 mm ------ Peak 27 mm Hg ----- diam, ED gradient, Left atrium S AP dim 47.49 mm ------ VTI ratio 0.3 ----- AP dim 2.34 cm/m^2 <2.2 LVOT/AV 4 index Area,  VTI 1.5 cm^2 ----- 3 Area index 0.7 cm^2/m^2 ----- (VTI) 5 Peak vel 0.3 ----- ratio, 5 LVOT/AV Area, Vmax 1.6 cm^2 ----- Area index 0.7 cm^2/m^2 ----- (Vmax) 9 Tricuspid valve Regurg 258 cm/s ----- peak vel Peak RV-RA 27 mm Hg ----- gradient, S Systemic veins Estimated 5 mm Hg ----- CVP Right ventricle Pressure, 32 mm Hg <30 S  ASSESSMENT AND PLAN: 1. Coronary artery disease, native vessel. The patient has no anginal symptoms at present. Will change him back from a beta blocker to a calcium channel blocker since he has more prominent fatigue symptoms on the beta blocker. Otherwise continue current therapy with plans for medical management unless  symptoms arise.  2. Aortic stenosis, mild. Gradients were increased and exam is suggestive of moderate aortic stenosis. I think he should have a repeat echocardiogram next year at a one-year interval.  3. Atrial fibrillation. Heart rate is controlled. He will continue on warfarin. Currently asymptomatic.  4. Hypertension. Primarily with systolic hypertension. Change from metoprolol succinate to verapamil. Otherwise continue current therapies.  For followup I would like to see him back in 3 months.  Tonny Bollman 09/20/2012 9:57 AM

## 2012-09-27 DIAGNOSIS — H353290 Exudative age-related macular degeneration, unspecified eye, stage unspecified: Secondary | ICD-10-CM | POA: Diagnosis not present

## 2012-10-18 ENCOUNTER — Other Ambulatory Visit: Payer: Self-pay

## 2012-10-18 MED ORDER — ISOSORBIDE MONONITRATE ER 60 MG PO TB24: 60.0000 mg | ORAL_TABLET | Freq: Every day | ORAL | Status: DC

## 2012-10-21 ENCOUNTER — Ambulatory Visit (INDEPENDENT_AMBULATORY_CARE_PROVIDER_SITE_OTHER): Payer: Medicare Other

## 2012-10-21 DIAGNOSIS — I4891 Unspecified atrial fibrillation: Secondary | ICD-10-CM

## 2012-10-21 LAB — POCT INR: INR: 2.4

## 2012-10-22 DIAGNOSIS — H35359 Cystoid macular degeneration, unspecified eye: Secondary | ICD-10-CM | POA: Diagnosis not present

## 2012-10-22 DIAGNOSIS — Z961 Presence of intraocular lens: Secondary | ICD-10-CM | POA: Diagnosis not present

## 2012-10-22 DIAGNOSIS — H3530 Unspecified macular degeneration: Secondary | ICD-10-CM | POA: Diagnosis not present

## 2012-10-22 DIAGNOSIS — H35059 Retinal neovascularization, unspecified, unspecified eye: Secondary | ICD-10-CM | POA: Diagnosis not present

## 2012-10-22 DIAGNOSIS — H353290 Exudative age-related macular degeneration, unspecified eye, stage unspecified: Secondary | ICD-10-CM | POA: Diagnosis not present

## 2012-10-29 DIAGNOSIS — H353290 Exudative age-related macular degeneration, unspecified eye, stage unspecified: Secondary | ICD-10-CM | POA: Diagnosis not present

## 2012-10-31 ENCOUNTER — Other Ambulatory Visit: Payer: Self-pay | Admitting: *Deleted

## 2012-10-31 ENCOUNTER — Telehealth: Payer: Self-pay | Admitting: *Deleted

## 2012-10-31 MED ORDER — METOPROLOL SUCCINATE ER 50 MG PO TB24: 50.0000 mg | ORAL_TABLET | Freq: Two times a day (BID) | ORAL | Status: DC

## 2012-10-31 NOTE — Telephone Encounter (Signed)
Entered chart in error.

## 2012-10-31 NOTE — Telephone Encounter (Signed)
Pt called with complaints about his Verapamil making him swimmy headed and like he had bricks on his legs. Wanted to know if he could switch back to Metoprolol.  Per Dr Excell Seltzer the pt can go back on Metoprolol Succinate 50mg  take one by mouth twice a day. Pt aware and script e-scribed to [facility]

## 2012-10-31 NOTE — Telephone Encounter (Signed)
Message copied by Valrie Hart on Thu Oct 31, 2012  9:47 AM ------      Message from: Iona Coach      Created: Thu Oct 31, 2012  8:36 AM      Regarding: RE: pt wants to go back on Metoprolol       Please contact the pt and let him know that per Dr Excell Seltzer the pt can go back on Metoprolol Succinate 50mg  take one by mouth twice a day            Lauren       ----- Message -----         From: Tonny Bollman, MD         Sent: 10/30/2012   7:07 PM           To: Sharyn Blitz, RN      Subject: RE: pt wants to go back on Metoprolol                    yes      ----- Message -----         From: Sharyn Blitz, RN         Sent: 10/30/2012   3:00 PM           To: Tonny Bollman, MD      Subject: pt wants to go back on Metoprolol                        The pt complained of feeling swimmy headed since taking Verapamil and would like to go back on Metoprolol.  Are you okay with making this change??              ------

## 2012-11-12 DIAGNOSIS — H353290 Exudative age-related macular degeneration, unspecified eye, stage unspecified: Secondary | ICD-10-CM | POA: Diagnosis not present

## 2012-11-29 DIAGNOSIS — Z23 Encounter for immunization: Secondary | ICD-10-CM | POA: Diagnosis not present

## 2012-12-02 ENCOUNTER — Ambulatory Visit (INDEPENDENT_AMBULATORY_CARE_PROVIDER_SITE_OTHER): Payer: Medicare Other | Admitting: General Practice

## 2012-12-02 DIAGNOSIS — I4891 Unspecified atrial fibrillation: Secondary | ICD-10-CM

## 2012-12-02 LAB — POCT INR: INR: 2.8

## 2012-12-03 ENCOUNTER — Encounter (HOSPITAL_COMMUNITY): Payer: Self-pay | Admitting: Emergency Medicine

## 2012-12-03 ENCOUNTER — Emergency Department (HOSPITAL_COMMUNITY): Payer: Medicare Other

## 2012-12-03 ENCOUNTER — Inpatient Hospital Stay (HOSPITAL_COMMUNITY)
Admission: EM | Admit: 2012-12-03 | Discharge: 2012-12-07 | DRG: 418 | Disposition: A | Discharge status: Home / Self Care | Payer: Medicare Other | Attending: Internal Medicine | Admitting: Internal Medicine

## 2012-12-03 DIAGNOSIS — Z87891 Personal history of nicotine dependence: Secondary | ICD-10-CM

## 2012-12-03 DIAGNOSIS — R3129 Other microscopic hematuria: Secondary | ICD-10-CM | POA: Diagnosis present

## 2012-12-03 DIAGNOSIS — Z96651 Presence of right artificial knee joint: Secondary | ICD-10-CM

## 2012-12-03 DIAGNOSIS — K5900 Constipation, unspecified: Secondary | ICD-10-CM | POA: Diagnosis present

## 2012-12-03 DIAGNOSIS — K8000 Calculus of gallbladder with acute cholecystitis without obstruction: Principal | ICD-10-CM | POA: Diagnosis present

## 2012-12-03 DIAGNOSIS — Z86711 Personal history of pulmonary embolism: Secondary | ICD-10-CM

## 2012-12-03 DIAGNOSIS — I359 Nonrheumatic aortic valve disorder, unspecified: Secondary | ICD-10-CM | POA: Diagnosis present

## 2012-12-03 DIAGNOSIS — I4891 Unspecified atrial fibrillation: Secondary | ICD-10-CM | POA: Diagnosis present

## 2012-12-03 DIAGNOSIS — E7800 Pure hypercholesterolemia, unspecified: Secondary | ICD-10-CM | POA: Diagnosis present

## 2012-12-03 DIAGNOSIS — I495 Sick sinus syndrome: Secondary | ICD-10-CM | POA: Diagnosis not present

## 2012-12-03 DIAGNOSIS — I2510 Atherosclerotic heart disease of native coronary artery without angina pectoris: Secondary | ICD-10-CM | POA: Diagnosis not present

## 2012-12-03 DIAGNOSIS — R609 Edema, unspecified: Secondary | ICD-10-CM

## 2012-12-03 DIAGNOSIS — R0789 Other chest pain: Secondary | ICD-10-CM | POA: Diagnosis not present

## 2012-12-03 DIAGNOSIS — Z7901 Long term (current) use of anticoagulants: Secondary | ICD-10-CM | POA: Diagnosis not present

## 2012-12-03 DIAGNOSIS — Z85828 Personal history of other malignant neoplasm of skin: Secondary | ICD-10-CM

## 2012-12-03 DIAGNOSIS — K219 Gastro-esophageal reflux disease without esophagitis: Secondary | ICD-10-CM | POA: Diagnosis present

## 2012-12-03 DIAGNOSIS — I10 Essential (primary) hypertension: Secondary | ICD-10-CM | POA: Diagnosis present

## 2012-12-03 DIAGNOSIS — K810 Acute cholecystitis: Secondary | ICD-10-CM | POA: Diagnosis present

## 2012-12-03 DIAGNOSIS — J9819 Other pulmonary collapse: Secondary | ICD-10-CM | POA: Diagnosis present

## 2012-12-03 DIAGNOSIS — I25119 Atherosclerotic heart disease of native coronary artery with unspecified angina pectoris: Secondary | ICD-10-CM | POA: Diagnosis present

## 2012-12-03 DIAGNOSIS — Z96659 Presence of unspecified artificial knee joint: Secondary | ICD-10-CM | POA: Diagnosis not present

## 2012-12-03 DIAGNOSIS — R109 Unspecified abdominal pain: Secondary | ICD-10-CM

## 2012-12-03 DIAGNOSIS — Z79899 Other long term (current) drug therapy: Secondary | ICD-10-CM | POA: Diagnosis not present

## 2012-12-03 DIAGNOSIS — I739 Peripheral vascular disease, unspecified: Secondary | ICD-10-CM | POA: Diagnosis present

## 2012-12-03 DIAGNOSIS — R079 Chest pain, unspecified: Secondary | ICD-10-CM | POA: Diagnosis present

## 2012-12-03 DIAGNOSIS — H353290 Exudative age-related macular degeneration, unspecified eye, stage unspecified: Secondary | ICD-10-CM | POA: Diagnosis not present

## 2012-12-03 DIAGNOSIS — I350 Nonrheumatic aortic (valve) stenosis: Secondary | ICD-10-CM | POA: Diagnosis present

## 2012-12-03 DIAGNOSIS — I4510 Unspecified right bundle-branch block: Secondary | ICD-10-CM | POA: Diagnosis present

## 2012-12-03 DIAGNOSIS — R1084 Generalized abdominal pain: Secondary | ICD-10-CM | POA: Diagnosis not present

## 2012-12-03 DIAGNOSIS — M129 Arthropathy, unspecified: Secondary | ICD-10-CM | POA: Diagnosis present

## 2012-12-03 DIAGNOSIS — E785 Hyperlipidemia, unspecified: Secondary | ICD-10-CM | POA: Diagnosis present

## 2012-12-03 DIAGNOSIS — I6529 Occlusion and stenosis of unspecified carotid artery: Secondary | ICD-10-CM | POA: Diagnosis present

## 2012-12-03 DIAGNOSIS — E663 Overweight: Secondary | ICD-10-CM

## 2012-12-03 DIAGNOSIS — R0989 Other specified symptoms and signs involving the circulatory and respiratory systems: Secondary | ICD-10-CM

## 2012-12-03 DIAGNOSIS — I4821 Permanent atrial fibrillation: Secondary | ICD-10-CM | POA: Diagnosis present

## 2012-12-03 DIAGNOSIS — E871 Hypo-osmolality and hyponatremia: Secondary | ICD-10-CM

## 2012-12-03 DIAGNOSIS — K432 Incisional hernia without obstruction or gangrene: Secondary | ICD-10-CM | POA: Diagnosis present

## 2012-12-03 DIAGNOSIS — K8010 Calculus of gallbladder with chronic cholecystitis without obstruction: Secondary | ICD-10-CM | POA: Diagnosis not present

## 2012-12-03 DIAGNOSIS — K8020 Calculus of gallbladder without cholecystitis without obstruction: Secondary | ICD-10-CM | POA: Diagnosis not present

## 2012-12-03 HISTORY — DX: Disorder of arteries and arterioles, unspecified: I77.9

## 2012-12-03 HISTORY — DX: Abdominal aortic aneurysm, without rupture, unspecified: I71.40

## 2012-12-03 HISTORY — DX: Abdominal aortic aneurysm, without rupture: I71.4

## 2012-12-03 HISTORY — DX: Long term (current) use of anticoagulants: Z79.01

## 2012-12-03 HISTORY — DX: Personal history of pulmonary embolism: Z86.711

## 2012-12-03 HISTORY — DX: Nonrheumatic aortic (valve) stenosis: I35.0

## 2012-12-03 HISTORY — DX: Peripheral vascular disease, unspecified: I73.9

## 2012-12-03 HISTORY — DX: Other microscopic hematuria: R31.29

## 2012-12-03 HISTORY — DX: Personal history of other malignant neoplasm of skin: Z85.828

## 2012-12-03 HISTORY — DX: Unspecified right bundle-branch block: I45.10

## 2012-12-03 HISTORY — DX: Permanent atrial fibrillation: I48.21

## 2012-12-03 LAB — URINALYSIS, ROUTINE W REFLEX MICROSCOPIC
Bilirubin Urine: NEGATIVE
Glucose, UA: NEGATIVE mg/dL
Ketones, ur: NEGATIVE mg/dL
Leukocytes, UA: NEGATIVE
Nitrite: NEGATIVE
Protein, ur: 30 mg/dL — AB
Specific Gravity, Urine: 1.019 (ref 1.005–1.030)
Urobilinogen, UA: 0.2 mg/dL (ref 0.0–1.0)
pH: 5 (ref 5.0–8.0)

## 2012-12-03 LAB — CBC
HCT: 46.4 % (ref 39.0–52.0)
Hemoglobin: 16.3 g/dL (ref 13.0–17.0)
MCH: 33 pg (ref 26.0–34.0)
MCHC: 35.1 g/dL (ref 30.0–36.0)
MCV: 93.9 fL (ref 78.0–100.0)
Platelets: 142 10*3/uL — ABNORMAL LOW (ref 150–400)
RBC: 4.94 MIL/uL (ref 4.22–5.81)
RDW: 15.1 % (ref 11.5–15.5)
WBC: 9.8 10*3/uL (ref 4.0–10.5)

## 2012-12-03 LAB — URINE MICROSCOPIC-ADD ON

## 2012-12-03 LAB — BASIC METABOLIC PANEL
BUN: 18 mg/dL (ref 6–23)
CO2: 26 mEq/L (ref 19–32)
Calcium: 9 mg/dL (ref 8.4–10.5)
Chloride: 102 mEq/L (ref 96–112)
Creatinine, Ser: 0.79 mg/dL (ref 0.50–1.35)
GFR calc Af Amer: >90 mL/min (ref >90)
GFR calc non Af Amer: 80 mL/min — ABNORMAL LOW (ref >90)
Glucose, Bld: 120 mg/dL — ABNORMAL HIGH (ref 70–99)
Potassium: 4.2 mEq/L (ref 3.5–5.1)
Sodium: 138 mEq/L (ref 135–145)

## 2012-12-03 LAB — PROTIME-INR
INR: 2.1 — ABNORMAL HIGH (ref 0.00–1.49)
Prothrombin Time: 22.9 seconds — ABNORMAL HIGH (ref 11.6–15.2)

## 2012-12-03 LAB — TYPE AND SCREEN
ABO/RH(D): A POS
Antibody Screen: NEGATIVE

## 2012-12-03 LAB — POCT I-STAT TROPONIN I: Troponin i, poc: 0.02 ng/mL (ref 0.00–0.08)

## 2012-12-03 LAB — ABO/RH: ABO/RH(D): A POS

## 2012-12-03 MED ORDER — CO Q 10 100 MG PO CAPS: 1.0000 | ORAL_CAPSULE | Freq: Two times a day (BID) | ORAL | Status: DC

## 2012-12-03 MED ORDER — DOCUSATE SODIUM 100 MG PO CAPS
100.0000 mg | ORAL_CAPSULE | Freq: Two times a day (BID) | ORAL | Status: DC
  Administered 2012-12-04: 100 mg via ORAL
  Filled 2012-12-03 (×3): qty 1

## 2012-12-03 MED ORDER — SODIUM CHLORIDE 0.9 % IV SOLN: 250.0000 mL | INTRAVENOUS | Status: DC | PRN

## 2012-12-03 MED ORDER — IOHEXOL 350 MG/ML SOLN
100.0000 mL | Freq: Once | INTRAVENOUS | Status: AC | PRN
  Administered 2012-12-03: 100 mL via INTRAVENOUS

## 2012-12-03 MED ORDER — ATORVASTATIN CALCIUM 20 MG PO TABS
20.0000 mg | ORAL_TABLET | Freq: Every evening | ORAL | Status: DC
  Filled 2012-12-03: qty 1

## 2012-12-03 MED ORDER — MORPHINE SULFATE 4 MG/ML IJ SOLN
4.0000 mg | Freq: Once | INTRAMUSCULAR | Status: AC
  Administered 2012-12-03: 4 mg via INTRAVENOUS
  Filled 2012-12-03: qty 1

## 2012-12-03 MED ORDER — ISOSORBIDE MONONITRATE ER 60 MG PO TB24
60.0000 mg | ORAL_TABLET | Freq: Every day | ORAL | Status: DC
  Administered 2012-12-04 – 2012-12-07 (×4): 60 mg via ORAL
  Filled 2012-12-03 (×4): qty 1

## 2012-12-03 MED ORDER — ALUM & MAG HYDROXIDE-SIMETH 200-200-20 MG/5ML PO SUSP: 30.0000 mL | Freq: Four times a day (QID) | ORAL | Status: DC | PRN

## 2012-12-03 MED ORDER — HYDROCHLOROTHIAZIDE 12.5 MG PO CAPS
12.5000 mg | ORAL_CAPSULE | Freq: Every day | ORAL | Status: DC
  Administered 2012-12-04 – 2012-12-06 (×3): 12.5 mg via ORAL
  Filled 2012-12-03 (×5): qty 1

## 2012-12-03 MED ORDER — EZETIMIBE 10 MG PO TABS
10.0000 mg | ORAL_TABLET | Freq: Every evening | ORAL | Status: DC
  Filled 2012-12-03: qty 1

## 2012-12-03 NOTE — ED Notes (Signed)
Patient transported to CT 

## 2012-12-03 NOTE — ED Notes (Signed)
Cardiology at bedside.

## 2012-12-03 NOTE — ED Provider Notes (Signed)
CSN: [id number]     Arrival date & time 12/03/12  1652 History   First MD Initiated Contact with Patient 12/03/12 1815     Chief Complaint  Patient presents with  . Chest Pain  . Back Pain   (Consider location/radiation/quality/duration/timing/severity/associated sxs/prior Treatment) The history is provided by the patient, medical records, a relative and the spouse. No language interpreter was used.     Tyler Williams Is an 79 year old L. with a significant cardiac history presents to the emergency department chief complaint of crushing chest pain and abdominal pain.  Onset was today at 2:45 PM.  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  Patient also had associated nausea and diaphoresis.  Patient states that his pain was worsening and ongoing.  Nausea resolved when he got back home.  Patient took one posterior and aspirin along with 2 sublingual nitroglycerin without relief of his pain.  EMS was called this was transported here given 2 more rounds of nitroglycerin.  He sat for approximately 45 minutes and then had some resolution of his pain.  Patient also has a known history of an abdominal aortic aneurysm which was repaired approximately 20 years ago.  He states that this is followed regularly and has not been progressively worsening.  Patient denies any urinary symptoms.  He's last bowel movement was yesterday and normal.  He denies diarrhea. Patient also denies ripping or  tearing chest pain  Past Medical History  Diagnosis Date  . CAD (coronary artery disease)     a. s/p multiple percutaneous prior coronary  artery interventions;  b.  LHC 05/24/12:  pD1 20%, oCFX 20%, mCFX 30%, ostial Int Br 30%, distal AV groove CFX 30%, pRCA 30-40%, mRCA 99%, dRCA 30%, PL branch small with diffuse 40% => attempt at RCA PCI unsuccessful => med Rx.  Marland Kitchen PAF (paroxysmal atrial  fibrillation)   . PVD (peripheral vascular disease)   . Hypercholesteremia   . SSS (sick sinus syndrome)   . Edema     lower extremity-right greater than left  . Hypertension   . GERD (gastroesophageal reflux disease)   . Cancer     hx of skin cancer   . Arthritis     knees   . Pulmonary embolism     1964 related to dislocated hip on right    Past Surgical History  Procedure Laterality Date  . Angioplasty    . Orthopedic surgery    . Coronary angioplasty    . Hernia repair      right inguinal hernia repair   . Abdominal aortic aneurysm repair      1993   . Other surgical history      right knee popliteal aneurysm surgery stent placed   . Tonsillectomy    . Other surgical history    . Hemorrhoid surgery      1973  . Eye surgery      hx of cataract surgery   . Joint replacement      left knee replacement  . Partial knee arthroplasty  01/22/2012    Procedure: UNICOMPARTMENTAL KNEE;  Surgeon: [name], MD;  Location: [facility];  Service: Orthopedics;  Laterality: Right;   No family history on file. History  Substance Use Topics  . Smoking status: Former Smoker    Types: Cigarettes    Quit date: 02/14/1983  .  Smokeless tobacco: Never Used  . Alcohol Use: 4.2 oz/week    7 Glasses of wine per week     Comment: 5 ounces of wine daily     Review of Systems Ten systems reviewed and are negative for acute change, except as noted in the HPI.   Allergies  Review of patient's allergies indicates no known allergies.  Home Medications   Current Outpatient Rx  Name  Route  Sig  Dispense  Refill  . atorvastatin (LIPITOR) 20 MG tablet   Oral   Take 20 mg by mouth every evening.         . Cholecalciferol (VITAMIN D-3) 5000 UNITS TABS   Oral   Take 5,000 Units by mouth daily.          . Coenzyme Q10 (CO Q 10) 100 MG CAPS   Oral   Take 1 capsule by mouth 2 (two) times daily.         Marland Kitchen ezetimibe (ZETIA) 10 MG tablet   Oral   Take 10 mg by mouth every  evening.         . hydrochlorothiazide (MICROZIDE) 12.5 MG capsule   Oral   Take 1 capsule (12.5 mg total) by mouth daily before breakfast.   30 capsule   6   . ibuprofen (ADVIL,MOTRIN) 200 MG tablet   Oral   Take 400 mg by mouth every 6 (six) hours as needed for pain.         . isosorbide mononitrate (IMDUR) 60 MG 24 hr tablet   Oral   Take 1 tablet (60 mg total) by mouth daily.   30 tablet   4   . LORazepam (ATIVAN) 1 MG tablet   Oral   Take 0.5-1 mg by mouth every 8 (eight) hours as needed for anxiety.          . metoprolol succinate (TOPROL-XL) 50 MG 24 hr tablet   Oral   Take 1 tablet (50 mg total) by mouth 2 (two) times daily. Take with or immediately following a meal.   60 tablet   3   . Multiple Vitamins-Minerals (CENTRUM SILVER PO)   Oral   Take 1 tablet by mouth daily.         . Multiple Vitamins-Minerals (OCUVITE PRESERVISION PO)   Oral   Take 1 tablet by mouth daily.         . nitroGLYCERIN (NITROSTAT) 0.4 MG SL tablet   Sublingual   Place 1 tablet (0.4 mg total) under the tongue every 5 (five) minutes x 3 doses as needed for chest pain.   30 tablet   4   . omega-3 acid ethyl esters (LOVAZA) 1 G capsule   Oral   Take 1 g by mouth daily.         . Psyllium (METAMUCIL) 30.9 % POWD   Oral   Take 1 packet by mouth 3 times/day as needed-between meals & bedtime.          . ramipril (ALTACE) 10 MG capsule   Oral   Take 10 mg by mouth 2 (two) times daily.         . Ranibizumab (LUCENTIS) 0.5 MG/0.05ML SOLN   Intraocular   Inject 0.5 mg into the eye See admin instructions. Pt gets this injection every 9 weeks. Next treatment is due on 01-18-12. Pt is followed by [facility].  Gets 1 shot at a time, one week apart.         Marland Kitchen  warfarin (COUMADIN) 5 MG tablet   Oral   Take 5-7.5 mg by mouth See admin instructions. Takes 7.5 mg (1.5 tablets) on mondays and 5 mg (1 tablet) all other days          BP 176/79  Pulse 76   Temp(Src) 98.8 F (37.1 C) (Oral)  Resp 14  SpO2 97% Physical Exam Physical Exam  Nursing note and vitals reviewed. Constitutional: He appears well-developed and well-nourished. No distress.  HENT:  Head: Normocephalic and atraumatic.  Eyes: Conjunctivae normal are normal. No scleral icterus.  Neck: Normal range of motion. Neck supple.  Cardiovascular: Irregularly irregular rhythm, systolic murmur consistent with aortic  Pulmonary/Chest: Effort normal and breath sounds normal. No respiratory distress.  Abdominal: Soft. There is no tenderness.  Musculoskeletal: He exhibits no edema.  Neurological: He is alert.  Skin: Skin is warm and dry. He is not diaphoretic.  Psychiatric: His behavior is normal.    ED Course  Procedures (including critical care time) Labs Review Labs Reviewed  CBC - Abnormal; Notable for the following:    Platelets 142 (*)    All other components within normal limits  BASIC METABOLIC PANEL - Abnormal; Notable for the following:    Glucose, Bld 120 (*)    GFR calc non Af Amer 80 (*)    All other components within normal limits  URINALYSIS, ROUTINE W REFLEX MICROSCOPIC - Abnormal; Notable for the following:    APPearance HAZY (*)    Hgb urine dipstick MODERATE (*)    Protein, ur 30 (*)    All other components within normal limits  URINE MICROSCOPIC-ADD ON  PROTIME-INR  POCT I-STAT TROPONIN I  TYPE AND SCREEN   Imaging Review Dg Chest 2 View  12/03/2012   CLINICAL DATA:  Chest pain and back pain  EXAM: CHEST  2 VIEW  COMPARISON:  05/22/2012  FINDINGS: Negative for heart failure or pneumonia. Negative for infiltrate or effusion or mass lesion. Overall improved aeration from the prior study.  IMPRESSION: No active cardiopulmonary disease.   Electronically Signed   By: Marlan Palau M.D.   On: 12/03/2012 18:38    EKG Interpretation   None       Date: 12/03/2012  Rate: 77  Rhythm: atrial fibrillation  QRS Axis: normal  Intervals: normal  ST/T  Wave abnormalities: ST depressions laterally  Conduction Disutrbances:right bundle branch block  Narrative Interpretation:   Old EKG Reviewed: unchanged   MDM   No diagnosis found. Patient seen here visit with Dr. Rhunette Croft. Concern for acute coronary syndrome versus dissecting aneurysm. Patient EKG unchanged and troponin negative.  CT Angiocath and abdomen are currently pending.    8:49 PM  CT angio shows diffuse cardiac/peripheral atherosclerotic disease.  Troponin negative.  HEART score is 7 and patient moderate risk for MACE. I believe the patient will need admission to cycle his enzymes and observe. i have placed call to cardiology.  10:00 PM BP 175/78  Pulse 79  Temp(Src) 98.8 F (37.1 C) (Oral)  Resp 23  SpO2 95% Patient cp free but continues to have abdominal pain.  Given morphine with sig. Relief. I have spoken with dr. Tresa Endo who will consult on the patient. I have also spoken with /dr. Perrini who is on call for her pcp practice. He asks for hospitalist admission. Patien twill be admitted by Dr. Emmaline Kluver, PA-C 12/04/12 [phone]

## 2012-12-03 NOTE — ED Notes (Signed)
Pt returned from xray

## 2012-12-03 NOTE — H&P (Signed)
[facility] History and Physical  Tyler Williams WUJ:[id number] DOB: [birth year]    PCP:   Minda Meo, MD   Chief Complaint: Chest pain and low abdominal pain.  HPI: Tyler Williams is an 79 y.o. male with Hx of known CAD s/p RCA PCI unsuccessfully 4/14, hx of microscopic hematuria followed by urologist, SSS, Aortic stenosis (non critical), PAF on coumadin with therapeutic INR, hx of GERD, HTN, hyperlipidemia, was in his usual state of health with no chest pain on exertion, developed SSCPressure, with lower abdominal pain after eating carrot with yesterday morning.  He has no shortness of breath, but had some nausea.  He also had lower abdominal pain, but no diarrhea.  He didn't have any fever, chills, black or bloody stools.  He had eating out at noon having had salad and soup, which his son had as well, and wasn't ill.  He took 2 SLNTGs, and received another 2 SL en route, without significant difference.  In the ER, he was given morphine, with total relief.  His ED work up included an EKG showing afib/NSR with RBBB patterns, no ischemic changes. He had a chest CTPA negative for PE, or dissection.  Abdominal CT showed no dissection, stenosis of SMA with collateral.  His WBC and Hb, along with renal fx tests and electrolytes were all negative.  Hospitalist was asked to admit him for atypical chest pain, r/out.  Rewiew of Systems:  Constitutional: Negative for malaise, fever and chills. No significant weight loss or weight gain Eyes: Negative for eye pain, redness and discharge, diplopia, visual changes, or flashes of light. ENMT: Negative for ear pain, hoarseness, nasal congestion, sinus pressure and sore throat. No headaches; tinnitus, drooling, or problem swallowing. Cardiovascular: Negative for chest pain, palpitations, diaphoresis, dyspnea and peripheral edema. ; No orthopnea, PND Respiratory: Negative for cough, hemoptysis, wheezing and stridor. No pleuritic  chestpain. Gastrointestinal: Negative for diarrhea, constipation, melena, blood in stool, hematemesis, jaundice and rectal bleeding.    Genitourinary: Negative for frequency, dysuria, incontinence,flank pain and hematuria; Musculoskeletal: Negative for back pain and neck pain. Negative for swelling and trauma.;  Skin: . Negative for pruritus, rash, abrasions, bruising and skin lesion.; ulcerations Neuro: Negative for headache, lightheadedness and neck stiffness. Negative for weakness, altered level of consciousness , altered mental status, extremity weakness, burning feet, involuntary movement, seizure and syncope.  Psych: negative for anxiety, depression, insomnia, tearfulness, panic attacks, hallucinations, paranoia, suicidal or homicidal ideation.  Past Medical History  Diagnosis Date  . CAD (coronary artery disease)     a. s/p multiple percutaneous prior coronary  artery interventions;  b.  LHC 05/24/12:  pD1 20%, oCFX 20%, mCFX 30%, ostial Int Br 30%, distal AV groove CFX 30%, pRCA 30-40%, mRCA 99%, dRCA 30%, PL branch small with diffuse 40% => attempt at RCA PCI unsuccessful => med Rx.  Marland Kitchen PAF (paroxysmal atrial fibrillation)   . PVD (peripheral vascular disease)   . Hypercholesteremia   . SSS (sick sinus syndrome)   . Edema     lower extremity-right greater than left  . Hypertension   . GERD (gastroesophageal reflux disease)   . Cancer     hx of skin cancer   . Arthritis     knees   . Pulmonary embolism     1964 related to dislocated hip on right     Past Surgical History  Procedure Laterality Date  . Angioplasty    . Orthopedic surgery    . Coronary angioplasty    .  Hernia repair      right inguinal hernia repair   . Abdominal aortic aneurysm repair      1993   . Other surgical history      right knee popliteal aneurysm surgery stent placed   . Tonsillectomy    . Other surgical history    . Hemorrhoid surgery      1973  . Eye surgery      hx of cataract surgery   .  Joint replacement      left knee replacement  . Partial knee arthroplasty  01/22/2012    Procedure: UNICOMPARTMENTAL KNEE;  Surgeon: [name], MD;  Location: [facility];  Service: Orthopedics;  Laterality: Right;    Medications:  HOME MEDS: Prior to Admission medications   Medication Sig Start Date End Date Taking? Authorizing Provider  atorvastatin (LIPITOR) 20 MG tablet Take 20 mg by mouth every evening.   Yes Historical Provider, MD  Cholecalciferol (VITAMIN D-3) 5000 UNITS TABS Take 5,000 Units by mouth daily.    Yes Historical Provider, MD  Coenzyme Q10 (CO Q 10) 100 MG CAPS Take 1 capsule by mouth 2 (two) times daily.   Yes Historical Provider, MD  ezetimibe (ZETIA) 10 MG tablet Take 10 mg by mouth every evening.   Yes Historical Provider, MD  hydrochlorothiazide (MICROZIDE) 12.5 MG capsule Take 1 capsule (12.5 mg total) by mouth daily before breakfast. 07/18/12  Yes Herby Abraham, MD  ibuprofen (ADVIL,MOTRIN) 200 MG tablet Take 400 mg by mouth every 6 (six) hours as needed for pain.   Yes Historical Provider, MD  isosorbide mononitrate (IMDUR) 60 MG 24 hr tablet Take 1 tablet (60 mg total) by mouth daily. 10/18/12  Yes Tonny Bollman, MD  LORazepam (ATIVAN) 1 MG tablet Take 0.5-1 mg by mouth every 8 (eight) hours as needed for anxiety.    Yes Historical Provider, MD  metoprolol succinate (TOPROL-XL) 50 MG 24 hr tablet Take 1 tablet (50 mg total) by mouth 2 (two) times daily. Take with or immediately following a meal. 10/31/12  Yes Tonny Bollman, MD  Multiple Vitamins-Minerals (CENTRUM SILVER PO) Take 1 tablet by mouth daily.   Yes Historical Provider, MD  Multiple Vitamins-Minerals (OCUVITE PRESERVISION PO) Take 1 tablet by mouth daily.   Yes Historical Provider, MD  nitroGLYCERIN (NITROSTAT) 0.4 MG SL tablet Place 1 tablet (0.4 mg total) under the tongue every 5 (five) minutes x 3 doses as needed for chest pain. 05/25/12  Yes Brooke O Edmisten, PA-C  omega-3 acid ethyl esters (LOVAZA) 1  G capsule Take 1 g by mouth daily.   Yes Historical Provider, MD  Psyllium (METAMUCIL) 30.9 % POWD Take 1 packet by mouth 3 times/day as needed-between meals & bedtime.    Yes Historical Provider, MD  ramipril (ALTACE) 10 MG capsule Take 10 mg by mouth 2 (two) times daily.   Yes Historical Provider, MD  Ranibizumab (LUCENTIS) 0.5 MG/0.05ML SOLN Inject 0.5 mg into the eye See admin instructions. Pt gets this injection every 9 weeks. Next treatment is due on 01-18-12. Pt is followed by [facility].  Gets 1 shot at a time, one week apart.   Yes Historical Provider, MD  warfarin (COUMADIN) 5 MG tablet Take 5-7.5 mg by mouth See admin instructions. Takes 7.5 mg (1.5 tablets) on mondays and 5 mg (1 tablet) all other days   Yes Historical Provider, MD     Allergies:  No Known Allergies  Social History:   reports that he  quit smoking about 29 years ago. His smoking use included Cigarettes. He smoked 0.00 packs per day. He has never used smokeless tobacco. He reports that he drinks about 4.2 ounces of alcohol per week. He reports that he does not use illicit drugs.  Family History: No family history on file.   Physical Exam: Filed Vitals:   12/03/12 1715 12/03/12 1811 12/03/12 1830 12/03/12 1900  BP:  191/82 176/79 175/78  Pulse: 82 78 76 79  Temp:      TempSrc:      Resp: 16 18 14 23   SpO2: 96% 95% 97% 95%   Blood pressure 175/78, pulse 79, temperature 98.8 F (37.1 C), temperature source Oral, resp. rate 23, SpO2 95.00%.  GEN:  Pleasant patient lying in the stretcher in no acute distress; cooperative with exam. PSYCH:  alert and oriented x4; does not appear anxious or depressed; affect is appropriate. HEENT: Mucous membranes pink and anicteric; PERRLA; EOM intact; no cervical lymphadenopathy nor thyromegaly or carotid bruit; no JVD; There were no stridor. Neck is very supple. Breasts:: Not examined CHEST WALL: No tenderness CHEST: Normal respiration, slight crackle at  base. HEART: Regular rate and rhythm.  There is a 3/6 mid SEM, cresd-decresendo murmur with audible S2. No  rub, or gallops.   BACK: No kyphosis or scoliosis; no CVA tenderness ABDOMEN: soft and non-tender; no masses, no organomegaly, normal abdominal bowel sounds; no pannus; no intertriginous candida. There is no rebound and no distention. Rectal Exam: Not done EXTREMITIES: No bone or joint deformity; age-appropriate arthropathy of the hands and knees; no edema; no ulcerations.  There is no calf tenderness. Genitalia: not examined PULSES: 2+ and symmetric SKIN: Normal hydration no rash or ulceration CNS: Cranial nerves 2-12 grossly intact no focal lateralizing neurologic deficit.  Speech is fluent; uvula elevated with phonation, facial symmetry and tongue midline. DTR are normal bilaterally, cerebella exam is intact, barbinski is negative and strengths are equaled bilaterally.  No sensory loss.   Labs on Admission:  Basic Metabolic Panel:  Recent Labs Lab 12/03/12 1803  NA 138  K 4.2  CL 102  CO2 26  GLUCOSE 120*  BUN 18  CREATININE 0.79  CALCIUM 9.0   Liver Function Tests: No results found for this basename: AST, ALT, ALKPHOS, BILITOT, PROT, ALBUMIN,  in the last 168 hours No results found for this basename: LIPASE, AMYLASE,  in the last 168 hours No results found for this basename: AMMONIA,  in the last 168 hours CBC:  Recent Labs Lab 12/03/12 1803  WBC 9.8  HGB 16.3  HCT 46.4  MCV 93.9  PLT 142*   Cardiac Enzymes: No results found for this basename: CKTOTAL, CKMB, CKMBINDEX, TROPONINI,  in the last 168 hours  CBG: No results found for this basename: GLUCAP,  in the last 168 hours   Radiological Exams on Admission: Dg Chest 2 View  12/03/2012   CLINICAL DATA:  Chest pain and back pain  EXAM: CHEST  2 VIEW  COMPARISON:  05/22/2012  FINDINGS: Negative for heart failure or pneumonia. Negative for infiltrate or effusion or mass lesion. Overall improved aeration from  the prior study.  IMPRESSION: No active cardiopulmonary disease.   Electronically Signed   By: Marlan Palau M.D.   On: 12/03/2012 18:38   Ct Angio Abdomen W/cm &/or Wo Contrast  12/03/2012   CLINICAL DATA:  Rule out dissection. Mid chest pain.  EXAM: CT ANGIOGRAPHY CHEST within without AND ABDOMEN with  TECHNIQUE: Initial noncontrast CT the  chest. Multidetector CT imaging of the chest and abdomen was performed using the standard protocol during bolus administration of intravenous contrast. Multiplanar CT image reconstructions including MIPs were obtained to evaluate the vascular anatomy.  CONTRAST:  OMNIPAQUE IOHEXOL 350 MG/ML SOLN  COMPARISON:  None.  FINDINGS: CTA CHEST FINDINGS  Non IV contrast images demonstrate no intramural hematoma within the thoracic aorta. Contrast enhanced imaging demonstrates no dissection or aneurysm of the ascending thoracic aorta, transverse thoracic aorta, or descending thoracic aorta. There is moderate intimal calcification. The great vessels are normal.  There is no central pulmonary embolism present. No pericardial fluid. Coronary artery calcifications are noted in all 3 coronary arteries.  No axillary supraclavicular lymphadenopathy. No mediastinal hilar lymphadenopathy. Esophagus is normal.  Review of the lung parenchyma demonstrates nodular atelectasis at the right lung base measuring 15 x 19 mm (image 92). There is a thin-walled cyst at the right lung base additionally.  Review of the MIP images confirms the above findings.  CTA ABDOMEN FINDINGS  No evidence of dissection or aneurysm of the abdominal aorta. The SMA and celiac trunk are patent. 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 There is ostial calcifications of the left and right renal arteries. There is bi-iliac bypass graft anatomy which is incompletely image. The IMA is diminutive in likely sacrificed and  back filling.  No focal hepatic lesion. There are multiple gallstones layering within the gallbladder lumen. The pancreas, spleen, adrenal glands are normal. There is bilateral nonenhancing renal cysts  The stomach limited view of the small bowel and colon are unremarkable. Limited view of the appendix is normal. No retroperitoneal lymphadenopathy.  Review of the MIP images confirms the above findings.  IMPRESSION: 1. No evidence of aortic dissection or aneurysm of the thoracic aorta or abdominal aorta.  2. No evidence of pulmonary embolism.  3.  Extensive coronary artery calcifications noted.  4. Nodular atelectasis at the right lung base. Recommend followup CT in 3 months.  5. 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 Extensive atherosclerotic disease of the renal arteries.  6. Cholelithiasis without cholecystitis.   Electronically Signed   By: Genevive Bi M.D.   On: 12/03/2012 20:30   Ct Angio Chest Aortic Dissect W &/or W/o  12/03/2012   CLINICAL DATA:  Rule out dissection. Mid chest pain.  EXAM: CT ANGIOGRAPHY CHEST within without AND ABDOMEN with  TECHNIQUE: Initial noncontrast CT the chest. Multidetector CT imaging of the chest and abdomen was performed using the standard protocol during bolus administration of intravenous contrast. Multiplanar CT image reconstructions including MIPs were obtained to evaluate the vascular anatomy.  CONTRAST:  OMNIPAQUE IOHEXOL 350 MG/ML SOLN  COMPARISON:  None.  FINDINGS: CTA CHEST FINDINGS  Non IV contrast images demonstrate no intramural hematoma within the thoracic aorta. Contrast enhanced imaging demonstrates no dissection or aneurysm of the ascending thoracic aorta, transverse thoracic aorta, or descending thoracic aorta. There is moderate intimal calcification. The great vessels are normal.  There is no central pulmonary embolism present. No pericardial fluid. Coronary artery calcifications are noted in all 3 coronary arteries.  No axillary  supraclavicular lymphadenopathy. No mediastinal hilar lymphadenopathy. Esophagus is normal.  Review of the lung parenchyma demonstrates nodular atelectasis at the right lung base measuring 15 x 19 mm (image 92). There is a thin-walled cyst at the right lung base additionally.  Review of the MIP images confirms the above findings.  CTA ABDOMEN FINDINGS  No evidence of dissection or aneurysm of the abdominal aorta. The SMA and celiac trunk are patent. 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 There is ostial calcifications of the left and right renal arteries. There is bi-iliac bypass graft anatomy which is incompletely image. The IMA is diminutive in likely sacrificed and back filling.  No focal hepatic lesion. There are multiple gallstones layering within the gallbladder lumen. The pancreas, spleen, adrenal glands are normal. There is bilateral nonenhancing renal cysts  The stomach limited view of the small bowel and colon are unremarkable. Limited view of the appendix is normal. No retroperitoneal lymphadenopathy.  Review of the MIP images confirms the above findings.  IMPRESSION: 1. No evidence of aortic dissection or aneurysm of the thoracic aorta or abdominal aorta.  2. No evidence of pulmonary embolism.  3.  Extensive coronary artery calcifications noted.  4. Nodular atelectasis at the right lung base. Recommend followup CT in 3 months.  5. 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 Extensive atherosclerotic disease of the renal arteries.  6. Cholelithiasis without cholecystitis.   Electronically Signed   By: Genevive Bi M.D.   On: 12/03/2012 20:30    EKG: Independently reviewed. Afib/NSR with RBBB, no acute ischemic changes.   Assessment/Plan Present on Admission:  . Chest pain, atypical . Atrial fibrillation . Aortic stenosis . CAD, NATIVE VESSEL . HYPERCHOLESTEROLEMIA  IIA .  Hematuria, microscopic  PLAN:  With known CAD, will admit him for atypical chest pain r/out.  I suspect that this was GI in etiology.  His abdominal exam, imaging, and serology were all benign.  It s also possible he has bladder spasm also.  He does have hx of microscopic hematuria, but he told me he had this work up before by urology and was negative.  Will admit him for atypical CP, cycle his troponins, and continue his home meds.  He is currently pain free and is stable.  His AS is benign, with gradient of only 27, and valve area of greater than 1cm2.  For his afib, no evidence of bleeding and his coumadin will be continued.  Thank you for allowing me to participate in the care of his nice patient.  Other plans as per orders.  Code Status: FULL Unk Lightning, MD. [facility] Pager [phone] 7pm to 7am.  12/03/2012, 10:44 PM

## 2012-12-03 NOTE — ED Notes (Signed)
Pt in xray

## 2012-12-03 NOTE — ED Notes (Signed)
Pt back from CT

## 2012-12-03 NOTE — ED Notes (Signed)
Pt states his chest pain is easing up, but pain in lower abdomen is "killing me". Denies currently feeling N/V/D, SOB.

## 2012-12-03 NOTE — ED Notes (Signed)
Pt originally asked for pain medication around 10:00pm but does not want at this time

## 2012-12-03 NOTE — ED Notes (Signed)
Per EMS - pt had sudden onset of crushing CP, ate lunch around 1230 then the pain started and has progressively gotten worse. Center of chest with radiation to center of back. Pt took 2 Nitro, no relief. EMS administered 1 Nitro, no relief. Pt took 324 ASA PTA. EMS started a 16 G in left forearm and administered 4 mg IV of zofran for nausea. Pt sts he went to his eye doctor around 2:30. BP 192/108 HR 93, 100% on room air. Last BP 175/91.

## 2012-12-04 ENCOUNTER — Encounter (HOSPITAL_COMMUNITY): Payer: Self-pay | Admitting: Anesthesiology

## 2012-12-04 DIAGNOSIS — I359 Nonrheumatic aortic valve disorder, unspecified: Secondary | ICD-10-CM | POA: Diagnosis not present

## 2012-12-04 DIAGNOSIS — R0789 Other chest pain: Secondary | ICD-10-CM

## 2012-12-04 DIAGNOSIS — R0989 Other specified symptoms and signs involving the circulatory and respiratory systems: Secondary | ICD-10-CM

## 2012-12-04 DIAGNOSIS — I2510 Atherosclerotic heart disease of native coronary artery without angina pectoris: Secondary | ICD-10-CM

## 2012-12-04 DIAGNOSIS — K8010 Calculus of gallbladder with chronic cholecystitis without obstruction: Secondary | ICD-10-CM | POA: Diagnosis not present

## 2012-12-04 DIAGNOSIS — I4891 Unspecified atrial fibrillation: Secondary | ICD-10-CM | POA: Diagnosis not present

## 2012-12-04 DIAGNOSIS — R109 Unspecified abdominal pain: Secondary | ICD-10-CM

## 2012-12-04 DIAGNOSIS — K8020 Calculus of gallbladder without cholecystitis without obstruction: Secondary | ICD-10-CM

## 2012-12-04 DIAGNOSIS — R079 Chest pain, unspecified: Secondary | ICD-10-CM

## 2012-12-04 LAB — TROPONIN I
Troponin I: <0.3 ng/mL (ref <0.30)
Troponin I: <0.3 ng/mL (ref <0.30)
Troponin I: <0.3 ng/mL (ref <0.30)

## 2012-12-04 LAB — HEPATIC FUNCTION PANEL
ALT: 25 U/L (ref 0–53)
AST: 27 U/L (ref 0–37)
Albumin: 3.3 g/dL — ABNORMAL LOW (ref 3.5–5.2)
Alkaline Phosphatase: 64 U/L (ref 39–117)
Bilirubin, Direct: 0.4 mg/dL — ABNORMAL HIGH (ref 0.0–0.3)
Indirect Bilirubin: 1.7 mg/dL — ABNORMAL HIGH (ref 0.3–0.9)
Total Bilirubin: 2.1 mg/dL — ABNORMAL HIGH (ref 0.3–1.2)
Total Protein: 6.8 g/dL (ref 6.0–8.3)

## 2012-12-04 LAB — PROTIME-INR
INR: 1.84 — ABNORMAL HIGH (ref 0.00–1.49)
Prothrombin Time: 20.7 seconds — ABNORMAL HIGH (ref 11.6–15.2)

## 2012-12-04 MED ORDER — METOPROLOL SUCCINATE ER 50 MG PO TB24
50.0000 mg | ORAL_TABLET | Freq: Two times a day (BID) | ORAL | Status: DC
  Administered 2012-12-04 – 2012-12-07 (×8): 50 mg via ORAL
  Filled 2012-12-04 (×9): qty 1

## 2012-12-04 MED ORDER — LORAZEPAM 0.5 MG PO TABS
0.5000 mg | ORAL_TABLET | Freq: Three times a day (TID) | ORAL | Status: DC | PRN
  Administered 2012-12-04 (×2): 1 mg via ORAL
  Administered 2012-12-06 – 2012-12-07 (×2): 0.5 mg via ORAL
  Filled 2012-12-04: qty 2
  Filled 2012-12-04: qty 1
  Filled 2012-12-04: qty 2
  Filled 2012-12-04: qty 1

## 2012-12-04 MED ORDER — EZETIMIBE 10 MG PO TABS
10.0000 mg | ORAL_TABLET | Freq: Every evening | ORAL | Status: DC
  Administered 2012-12-04 – 2012-12-06 (×3): 10 mg via ORAL
  Filled 2012-12-04 (×4): qty 1

## 2012-12-04 MED ORDER — RAMIPRIL 10 MG PO CAPS
10.0000 mg | ORAL_CAPSULE | Freq: Two times a day (BID) | ORAL | Status: DC
  Administered 2012-12-04 – 2012-12-07 (×8): 10 mg via ORAL
  Filled 2012-12-04 (×9): qty 1

## 2012-12-04 MED ORDER — ADULT MULTIVITAMIN W/MINERALS CH
1.0000 | ORAL_TABLET | Freq: Every day | ORAL | Status: DC
  Administered 2012-12-04 – 2012-12-07 (×3): 1 via ORAL
  Filled 2012-12-04 (×4): qty 1

## 2012-12-04 MED ORDER — WARFARIN - PHARMACIST DOSING INPATIENT: Freq: Every day | Status: DC

## 2012-12-04 MED ORDER — SODIUM CHLORIDE 0.9 % IJ SOLN
3.0000 mL | Freq: Two times a day (BID) | INTRAMUSCULAR | Status: DC
  Administered 2012-12-04 – 2012-12-07 (×6): 3 mL via INTRAVENOUS

## 2012-12-04 MED ORDER — ZOLPIDEM TARTRATE 5 MG PO TABS
5.0000 mg | ORAL_TABLET | Freq: Every evening | ORAL | Status: DC | PRN
  Administered 2012-12-05: 5 mg via ORAL
  Filled 2012-12-04: qty 1

## 2012-12-04 MED ORDER — SODIUM CHLORIDE 0.9 % IJ SOLN: 3.0000 mL | INTRAMUSCULAR | Status: DC | PRN

## 2012-12-04 MED ORDER — HEPARIN (PORCINE) IN NACL 100-0.45 UNIT/ML-% IJ SOLN
1450.0000 [IU]/h | INTRAMUSCULAR | Status: DC
  Administered 2012-12-04: 1200 [IU]/h via INTRAVENOUS
  Filled 2012-12-04 (×2): qty 250

## 2012-12-04 MED ORDER — MAGNESIUM HYDROXIDE 400 MG/5ML PO SUSP
15.0000 mL | Freq: Two times a day (BID) | ORAL | Status: DC | PRN
  Administered 2012-12-04: 15 mL via ORAL
  Filled 2012-12-04: qty 30

## 2012-12-04 MED ORDER — WARFARIN SODIUM 5 MG PO TABS
5.0000 mg | ORAL_TABLET | Freq: Once | ORAL | Status: DC
  Filled 2012-12-04: qty 1

## 2012-12-04 MED ORDER — POLYETHYLENE GLYCOL 3350 17 G PO PACK
17.0000 g | PACK | Freq: Every day | ORAL | Status: DC | PRN
  Filled 2012-12-04 (×2): qty 1

## 2012-12-04 MED ORDER — ATORVASTATIN CALCIUM 20 MG PO TABS
20.0000 mg | ORAL_TABLET | Freq: Every evening | ORAL | Status: DC
  Administered 2012-12-04 – 2012-12-06 (×3): 20 mg via ORAL
  Filled 2012-12-04 (×4): qty 1

## 2012-12-04 MED ORDER — POLYETHYLENE GLYCOL 3350 17 G PO PACK
17.0000 g | PACK | Freq: Once | ORAL | Status: AC
  Administered 2012-12-04: 17 g via ORAL
  Filled 2012-12-04: qty 1

## 2012-12-04 MED ORDER — MORPHINE SULFATE 2 MG/ML IJ SOLN: 2.0000 mg | INTRAMUSCULAR | Status: DC | PRN

## 2012-12-04 MED ORDER — CIPROFLOXACIN IN D5W 400 MG/200ML IV SOLN
400.0000 mg | INTRAVENOUS | Status: AC
  Administered 2012-12-05: 400 mg via INTRAVENOUS
  Filled 2012-12-04: qty 200

## 2012-12-04 MED ORDER — VITAMIN D 1000 UNITS PO TABS
5000.0000 [IU] | ORAL_TABLET | Freq: Every day | ORAL | Status: DC
  Administered 2012-12-04 – 2012-12-07 (×3): 5000 [IU] via ORAL
  Filled 2012-12-04 (×4): qty 5

## 2012-12-04 MED ORDER — WARFARIN SODIUM 5 MG PO TABS
5.0000 mg | ORAL_TABLET | Freq: Once | ORAL | Status: AC
  Administered 2012-12-04: 5 mg via ORAL
  Filled 2012-12-04: qty 1

## 2012-12-04 MED ORDER — DOCUSATE SODIUM 100 MG PO CAPS
200.0000 mg | ORAL_CAPSULE | Freq: Two times a day (BID) | ORAL | Status: DC
Start: 2012-12-04 — End: 2012-12-07
  Administered 2012-12-04 – 2012-12-07 (×5): 200 mg via ORAL
  Filled 2012-12-04 (×8): qty 2

## 2012-12-04 MED ORDER — ONDANSETRON HCL 4 MG/2ML IJ SOLN: 4.0000 mg | Freq: Four times a day (QID) | INTRAMUSCULAR | Status: DC | PRN

## 2012-12-04 MED ORDER — PSYLLIUM 95 % PO PACK
1.0000 | PACK | Freq: Every day | ORAL | Status: DC
  Administered 2012-12-04 – 2012-12-07 (×3): 1 via ORAL
  Filled 2012-12-04 (×4): qty 1

## 2012-12-04 MED ORDER — FLEET ENEMA 7-19 GM/118ML RE ENEM
1.0000 | ENEMA | Freq: Once | RECTAL | Status: AC
  Administered 2012-12-04: 1 via RECTAL
  Filled 2012-12-04: qty 1

## 2012-12-04 MED ORDER — SODIUM CHLORIDE 0.9 % IJ SOLN
3.0000 mL | Freq: Two times a day (BID) | INTRAMUSCULAR | Status: DC
  Administered 2012-12-04 – 2012-12-07 (×4): 3 mL via INTRAVENOUS

## 2012-12-04 MED ORDER — ONDANSETRON HCL 4 MG PO TABS: 4.0000 mg | ORAL_TABLET | Freq: Four times a day (QID) | ORAL | Status: DC | PRN

## 2012-12-04 MED ORDER — NITROGLYCERIN 0.4 MG SL SUBL: 0.4000 mg | SUBLINGUAL_TABLET | SUBLINGUAL | Status: DC | PRN

## 2012-12-04 MED ORDER — VITAMIN K1 10 MG/ML IJ SOLN
5.0000 mg | Freq: Once | INTRAMUSCULAR | Status: AC
  Administered 2012-12-04: 5 mg via SUBCUTANEOUS
  Filled 2012-12-04: qty 0.5

## 2012-12-04 MED ORDER — OMEGA-3-ACID ETHYL ESTERS 1 G PO CAPS
1.0000 g | ORAL_CAPSULE | Freq: Every day | ORAL | Status: DC
  Administered 2012-12-04 – 2012-12-07 (×3): 1 g via ORAL
  Filled 2012-12-04 (×4): qty 1

## 2012-12-04 NOTE — Progress Notes (Signed)
ANTICOAGULATION CONSULT NOTE - Initial Consult  Pharmacy Consult for Warfarin Indication: atrial fibrillation  No Known Allergies  Patient Measurements: Height: 5\' 9"  (175.3 cm) Weight: 191 lb 4.8 oz (86.773 kg) IBW/kg (Calculated) : 70.7  Vital Signs: Temp: 98.4 F (36.9 C) (10/21 2352) Temp src: Oral (10/21 2352) BP: 162/81 mmHg (10/21 2352) Pulse Rate: 82 (10/21 2352)  Labs:  Recent Labs  12/02/12 0952 12/03/12 1803 12/03/12 2026  HGB  --  16.3  --   HCT  --  46.4  --   PLT  --  142*  --   LABPROT  --   --  22.9*  INR 2.8  --  2.10*  CREATININE  --  0.79  --     Estimated Creatinine Clearance: 75 ml/min (by C-G formula based on Cr of 0.79).   Medical History: Past Medical History  Diagnosis Date  . CAD (coronary artery disease)     a. s/p multiple percutaneous prior coronary  artery interventions;  b.  LHC 05/24/12:  pD1 20%, oCFX 20%, mCFX 30%, ostial Int Br 30%, distal AV groove CFX 30%, pRCA 30-40%, mRCA 99%, dRCA 30%, PL branch small with diffuse 40% => attempt at RCA PCI unsuccessful => med Rx.  Marland Kitchen PAF (paroxysmal atrial fibrillation)   . PVD (peripheral vascular disease)   . Hypercholesteremia   . SSS (sick sinus syndrome)   . Edema     lower extremity-right greater than left  . Hypertension   . GERD (gastroesophageal reflux disease)   . Cancer     hx of skin cancer   . Arthritis     knees   . Pulmonary embolism     1964 related to dislocated hip on right     Medications:  -Warfarin PTA dose: 7.5 mg Monday, 5 mg all other days  Assessment: 79 y/o on chronic warfarin for afib. Here with CP/abd pain. INR is 2.10, Hgb 16.3, Scr 0.79, no overt bleeding noted. Per RN, pt didn't take warfarin this past evening.   Goal of Therapy:  INR 2-3 Monitor platelets by anticoagulation protocol: Yes   Plan:  -Warfarin 5mg  PO x 1 NOW -Daily PT/INR -Monitor for bleeding  Thank you for allowing me to take part in this patient's care,  Abran Duke,  PharmD Clinical Pharmacist Phone: [phone] Pager: [phone] 12/04/2012 12:31 AM

## 2012-12-04 NOTE — Consult Note (Addendum)
Cardiology Consult Note   Patient ID: MAN EFFERTZ MRN: [id number], DOB/AGE: 77-16-1928   Admit date: 12/03/2012 Date of Consult: 12/04/2012  Primary Physician: Minda Meo, MD Primary Cardiologist: Judie Petit. Excell Seltzer, MD  Reason for consult: chest pain  HPI: Tyler Williams is a 79 y.o. male w/ a significant cardiac history and PMHx outlined below.   He has a history of CAD s/p PTCA x 2 in the 1980s and 1990s, respectively. He was admitted to [facility] 4/9-4/01/2013 for unstable angina and underwent cardiac catheterization. As below, a 99% mid RCA lesion was appreciated, however a balloon, transit catheter or Rotablator wire were unable to cross the lesion due to heavy calcification. Therefore, medical therapy was recommended. Outpatient verapamil was replaced with Toprol-XL. ASA and Imdur were added. Plavix held to avoid triple therapy.   Of note, an echocardiogram in 04/22/12 showed EF 65-70%, mild aortic stenosis, mean gradient 17, mild MR, moderate LAE, PASP 32 mmHg. Mild AS is to be monitored.   He followed up with Dr. Excell Seltzer 09/20/12 and endorsed fatigue. He denied anginal symptoms at that time. He has been maintained on ASA, BB, ACEi, statin, Imdur and NTG SL PRN.   He reports being in his USOH yesterday- he is able to walk 2 miles on a hilly surface w/o incident. He plays golf. He had carrot salad in the morning, then ate soup for lunch with a relative. He reported experiencing abdominal discomfort which migrated to his epigastrium/lower chest. EMS was called. He received NTG SL x 4 total w/o relief. Clearly different from his typical anginal pain. He has noted recent constipation. Some nausea with yesterday's episode.  In the ED, EKD revealed rate-controlled a-fib, RBBB (old), downsloping subtle ST depressions V3-V6 (<1 mm)- mildly more pronounced from baseline. Initial TnI WNL x 1. CT-A of the chest and abdomen was performed indicating no dissection or PE. SMA stenosis w/  collateralization and diffuse bilateral renal atherosclerosis was identified. No mention of abdominal aortic abnormalities or acute intrabdominal process. He did have RLL nodular atelectasis and follow-up CT in 3 months was recommended. BMET, CBC WNL. PA/lateral CXR w/o acute cardiopulmonary disease. U/a with moderate Hgb. He was given morphine in the ED with complete relief.   Problem List: Past Medical History  Diagnosis Date  . CAD (coronary artery disease)     a. s/p multiple percutaneous prior coronary artery interventions b. 05/2012: unsuccessful PCI to tight RCA->medically managed  . Permanent atrial fibrillation   . PVD (peripheral vascular disease)     Bilateral renal artery atherosclerosis, SMA stenosis with collateralization  . Hypercholesteremia   . SSS (sick sinus syndrome)   . Hypertension   . GERD (gastroesophageal reflux disease)   . History of skin cancer   . Arthritis     knees  . History of pulmonary embolism     1964 related to dislocated hip on right   . Chronic anticoagulation     Coumadin  . Aortic stenosis, mild     a. mean gradient 17 mmHg on 04/2012 TTE  . Hypertension   . Microscopic hematuria     Followed by urology  . RBBB     Chronic  . AAA (abdominal aortic aneurysm)     s/p repair  . GERD (gastroesophageal reflux disease)   . Carotid artery disease     0-39% bilateral ICA stenoses 11/2011    Past Surgical History  Procedure Laterality Date  . Orthopedic surgery    . Coronary angioplasty  1980, 1990  . Hernia repair      right inguinal hernia repair   . Abdominal aortic aneurysm repair      1993   . Other surgical history      right knee popliteal aneurysm surgery stent placed   . Tonsillectomy    . Other surgical history    . Hemorrhoid surgery      1973  . Eye surgery      hx of cataract surgery   . Partial knee arthroplasty  01/22/2012    Procedure: UNICOMPARTMENTAL KNEE;  Surgeon: [name], MD;  Location: [facility]S;  Service:  Orthopedics;  Laterality: Right;  . Joint replacement      left knee replacement, partial right  . Cardiac catheterization  05/24/2012    pD1 20%, oCFX 20%, mCFX 30%, ostial Int Br 30%, distal AV groove CFX 30%, pRCA 30-40%, mRCA 99%, dRCA 30%, PL branch small with diffuse 40%. Unable to pass wire past RCA lesion->medically managed     Allergies: No Known Allergies  Home Medications: Prior to Admission medications   Medication Sig Start Date End Date Taking? Authorizing Provider  atorvastatin (LIPITOR) 20 MG tablet Take 20 mg by mouth every evening.   Yes Historical Provider, MD  Cholecalciferol (VITAMIN D-3) 5000 UNITS TABS Take 5,000 Units by mouth daily.    Yes Historical Provider, MD  Coenzyme Q10 (CO Q 10) 100 MG CAPS Take 1 capsule by mouth 2 (two) times daily.   Yes Historical Provider, MD  ezetimibe (ZETIA) 10 MG tablet Take 10 mg by mouth every evening.   Yes Historical Provider, MD  hydrochlorothiazide (MICROZIDE) 12.5 MG capsule Take 1 capsule (12.5 mg total) by mouth daily before breakfast. 07/18/12  Yes Herby Abraham, MD  ibuprofen (ADVIL,MOTRIN) 200 MG tablet Take 400 mg by mouth every 6 (six) hours as needed for pain.   Yes Historical Provider, MD  isosorbide mononitrate (IMDUR) 60 MG 24 hr tablet Take 1 tablet (60 mg total) by mouth daily. 10/18/12  Yes Tonny Bollman, MD  LORazepam (ATIVAN) 1 MG tablet Take 0.5-1 mg by mouth every 8 (eight) hours as needed for anxiety.    Yes Historical Provider, MD  metoprolol succinate (TOPROL-XL) 50 MG 24 hr tablet Take 1 tablet (50 mg total) by mouth 2 (two) times daily. Take with or immediately following a meal. 10/31/12  Yes Tonny Bollman, MD  Multiple Vitamins-Minerals (CENTRUM SILVER PO) Take 1 tablet by mouth daily.   Yes Historical Provider, MD  Multiple Vitamins-Minerals (OCUVITE PRESERVISION PO) Take 1 tablet by mouth daily.   Yes Historical Provider, MD  nitroGLYCERIN (NITROSTAT) 0.4 MG SL tablet Place 1 tablet (0.4 mg total) under  the tongue every 5 (five) minutes x 3 doses as needed for chest pain. 05/25/12  Yes Brooke O Edmisten, PA-C  omega-3 acid ethyl esters (LOVAZA) 1 G capsule Take 1 g by mouth daily.   Yes Historical Provider, MD  Psyllium (METAMUCIL) 30.9 % POWD Take 1 packet by mouth 3 times/day as needed-between meals & bedtime.    Yes Historical Provider, MD  ramipril (ALTACE) 10 MG capsule Take 10 mg by mouth 2 (two) times daily.   Yes Historical Provider, MD  Ranibizumab (LUCENTIS) 0.5 MG/0.05ML SOLN Inject 0.5 mg into the eye See admin instructions. Pt gets this injection every 9 weeks. Next treatment is due on 01-18-12. Pt is followed by [facility].  Gets 1 shot at a time, one week apart.   Yes Historical  Provider, MD  warfarin (COUMADIN) 5 MG tablet Take 5-7.5 mg by mouth See admin instructions. Takes 7.5 mg (1.5 tablets) on mondays and 5 mg (1 tablet) all other days   Yes Historical Provider, MD    Inpatient Medications:  . atorvastatin  20 mg Oral QPM  . cholecalciferol  5,000 Units Oral Daily  . docusate sodium  200 mg Oral BID  . ezetimibe  10 mg Oral QPM  . hydrochlorothiazide  12.5 mg Oral QAC breakfast  . isosorbide mononitrate  60 mg Oral Daily  . metoprolol succinate  50 mg Oral BID  . multivitamin with minerals  1 tablet Oral Daily  . omega-3 acid ethyl esters  1 g Oral Daily  . psyllium  1 packet Oral Daily  . ramipril  10 mg Oral BID  . sodium chloride  3 mL Intravenous Q12H  . sodium chloride  3 mL Intravenous Q12H  . warfarin  5 mg Oral ONCE-1800  . Warfarin - Pharmacist Dosing Inpatient   Does not apply q1800   Prescriptions prior to admission  Medication Sig Dispense Refill  . atorvastatin (LIPITOR) 20 MG tablet Take 20 mg by mouth every evening.      . Cholecalciferol (VITAMIN D-3) 5000 UNITS TABS Take 5,000 Units by mouth daily.       . Coenzyme Q10 (CO Q 10) 100 MG CAPS Take 1 capsule by mouth 2 (two) times daily.      Marland Kitchen ezetimibe (ZETIA) 10 MG tablet Take 10 mg by  mouth every evening.      . hydrochlorothiazide (MICROZIDE) 12.5 MG capsule Take 1 capsule (12.5 mg total) by mouth daily before breakfast.  30 capsule  6  . ibuprofen (ADVIL,MOTRIN) 200 MG tablet Take 400 mg by mouth every 6 (six) hours as needed for pain.      . isosorbide mononitrate (IMDUR) 60 MG 24 hr tablet Take 1 tablet (60 mg total) by mouth daily.  30 tablet  4  . LORazepam (ATIVAN) 1 MG tablet Take 0.5-1 mg by mouth every 8 (eight) hours as needed for anxiety.       . metoprolol succinate (TOPROL-XL) 50 MG 24 hr tablet Take 1 tablet (50 mg total) by mouth 2 (two) times daily. Take with or immediately following a meal.  60 tablet  3  . Multiple Vitamins-Minerals (CENTRUM SILVER PO) Take 1 tablet by mouth daily.      . Multiple Vitamins-Minerals (OCUVITE PRESERVISION PO) Take 1 tablet by mouth daily.      . nitroGLYCERIN (NITROSTAT) 0.4 MG SL tablet Place 1 tablet (0.4 mg total) under the tongue every 5 (five) minutes x 3 doses as needed for chest pain.  30 tablet  4  . omega-3 acid ethyl esters (LOVAZA) 1 G capsule Take 1 g by mouth daily.      . Psyllium (METAMUCIL) 30.9 % POWD Take 1 packet by mouth 3 times/day as needed-between meals & bedtime.       . ramipril (ALTACE) 10 MG capsule Take 10 mg by mouth 2 (two) times daily.      . Ranibizumab (LUCENTIS) 0.5 MG/0.05ML SOLN Inject 0.5 mg into the eye See admin instructions. Pt gets this injection every 9 weeks. Next treatment is due on 01-18-12. Pt is followed by [facility].  Gets 1 shot at a time, one week apart.      . warfarin (COUMADIN) 5 MG tablet Take 5-7.5 mg by mouth See admin instructions. Takes 7.5 mg (1.5 tablets)  on mondays and 5 mg (1 tablet) all other days        Family History  Problem Relation Age of Onset  . Heart attack Mother 10     History   Social History  . Marital Status: Married    Spouse Name: N/A    Number of Children: N/A  . Years of Education: N/A   Occupational History  . Not on file.    Social History Main Topics  . Smoking status: Former Smoker    Types: Cigarettes    Quit date: 02/14/1983  . Smokeless tobacco: Never Used  . Alcohol Use: 4.2 oz/week    7 Glasses of wine per week     Comment: 5 ounces of wine daily   . Drug Use: No  . Sexual Activity: Not on file   Other Topics Concern  . Not on file   Social History Narrative  . No narrative on file     Review of Systems: General: negative for chills, fever, night sweats or weight changes.  Cardiovascular: negative for dyspnea on exertion, edema, orthopnea, palpitations, paroxysmal nocturnal dyspnea or shortness of breath Dermatological: negative for rash Respiratory: negative for cough or wheezing Urologic:  negative for hematuria Abdominal: positive for abdominal/epigastric pain, negative for nausea, vomiting, diarrhea, bright red blood per rectum, melena, or hematemesis Neurologic: negative for visual changes, syncope, or dizziness All other systems reviewed and are otherwise negative except as noted above.  Physical Exam: Blood pressure 131/68, pulse 67, temperature 98.1 F (36.7 C), temperature source Axillary, resp. rate 18, height 5\' 9"  (1.753 m), weight 86.773 kg (191 lb 4.8 oz), SpO2 95.00%.   General: Well developed, well nourished, in no acute distress. Head: Normocephalic, atraumatic, sclera non-icteric, no xanthomas, nares are without discharge.  Neck: Negative for carotid bruits. JVD not elevated. Lungs:  Clear bilaterally to auscultation without wheezes, rales, or rhonchi. Breathing is unlabored. Heart: RRR with S1 S2, II/VI systolic crescendo-decrescendo murmur at RUSB, no rubs or gallops appreciated. Abdomen: Mildly distended, tenderness to palpation to abdominal AAA repair scar, hypoactive bowel sounds. No hepatomegaly. No rebound/guarding. No obvious abdominal masses. Msk:  Strength and tone appears normal for age. Extremities: No clubbing, cyanosis or edema. Bilateral LE  hyperpigmentation.  Distal pedal pulses are 2+ and equal bilaterally. Neuro: Alert and oriented X 3. Moves all extremities spontaneously. Psych:  Responds to questions appropriately with a normal affect.  Labs: Recent Labs     12/03/12  1803  WBC  9.8  HGB  16.3  HCT  46.4  MCV  93.9  PLT  142*    Recent Labs Lab 12/03/12 1803  NA 138  K 4.2  CL 102  CO2 26  BUN 18  CREATININE 0.79  CALCIUM 9.0  GLUCOSE 120*   Recent Labs     12/04/12  0118  12/04/12  0538  12/04/12  1049  TROPONINI  <0.30  <0.30  <0.30   Radiology/Studies: Dg Chest 2 View  12/03/2012   CLINICAL DATA:  Chest pain and back pain  EXAM: CHEST  2 VIEW  COMPARISON:  05/22/2012  FINDINGS: Negative for heart failure or pneumonia. Negative for infiltrate or effusion or mass lesion. Overall improved aeration from the prior study.  IMPRESSION: No active cardiopulmonary disease.   Electronically Signed   By: Marlan Palau M.D.   On: 12/03/2012 18:38   Ct Angio Abdomen W/cm &/or Wo Contrast  12/03/2012   CLINICAL DATA:  Rule out dissection. Mid chest pain.  EXAM: CT ANGIOGRAPHY CHEST within without AND ABDOMEN with  TECHNIQUE: Initial noncontrast CT the chest. Multidetector CT imaging of the chest and abdomen was performed using the standard protocol during bolus administration of intravenous contrast. Multiplanar CT image reconstructions including MIPs were obtained to evaluate the vascular anatomy.  CONTRAST:  OMNIPAQUE IOHEXOL 350 MG/ML SOLN  COMPARISON:  None.  FINDINGS: CTA CHEST FINDINGS  Non IV contrast images demonstrate no intramural hematoma within the thoracic aorta. Contrast enhanced imaging demonstrates no dissection or aneurysm of the ascending thoracic aorta, transverse thoracic aorta, or descending thoracic aorta. There is moderate intimal calcification. The great vessels are normal.  There is no central pulmonary embolism present. No pericardial fluid. Coronary artery calcifications are noted in  all 3 coronary arteries.  No axillary supraclavicular lymphadenopathy. No mediastinal hilar lymphadenopathy. Esophagus is normal.  Review of the lung parenchyma demonstrates nodular atelectasis at the right lung base measuring 15 x 19 mm (image 92). There is a thin-walled cyst at the right lung base additionally.  Review of the MIP images confirms the above findings.  CTA ABDOMEN FINDINGS  No evidence of dissection or aneurysm of the abdominal aorta. The SMA and celiac trunk are patent. 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 There is ostial calcifications of the left and right renal arteries. There is bi-iliac bypass graft anatomy which is incompletely image. The IMA is diminutive in likely sacrificed and back filling.  No focal hepatic lesion. There are multiple gallstones layering within the gallbladder lumen. The pancreas, spleen, adrenal glands are normal. There is bilateral nonenhancing renal cysts  The stomach limited view of the small bowel and colon are unremarkable. Limited view of the appendix is normal. No retroperitoneal lymphadenopathy.  Review of the MIP images confirms the above findings.  IMPRESSION: 1. No evidence of aortic dissection or aneurysm of the thoracic aorta or abdominal aorta.  2. No evidence of pulmonary embolism.  3.  Extensive coronary artery calcifications noted.  4. Nodular atelectasis at the right lung base. Recommend followup CT in 3 months.  5. 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 Extensive atherosclerotic disease of the renal arteries.  6. Cholelithiasis without cholecystitis.   Electronically Signed   By: Genevive Bi M.D.   On: 12/03/2012 20:30   Ct Angio Chest Aortic Dissect W &/or W/o  12/03/2012   CLINICAL DATA:  Rule out dissection. Mid chest pain.  EXAM: CT ANGIOGRAPHY CHEST within without AND ABDOMEN with  TECHNIQUE: Initial noncontrast CT the  chest. Multidetector CT imaging of the chest and abdomen was performed using the standard protocol during bolus administration of intravenous contrast. Multiplanar CT image reconstructions including MIPs were obtained to evaluate the vascular anatomy.  CONTRAST:  OMNIPAQUE IOHEXOL 350 MG/ML SOLN  COMPARISON:  None.  FINDINGS: CTA CHEST FINDINGS  Non IV contrast images demonstrate no intramural hematoma within the thoracic aorta. Contrast enhanced imaging demonstrates no dissection or aneurysm of the ascending thoracic aorta, transverse thoracic aorta, or descending thoracic aorta. There is moderate intimal calcification. The great vessels are normal.  There is no central pulmonary embolism present. No pericardial fluid. Coronary artery calcifications are noted in all 3 coronary arteries.  No axillary supraclavicular lymphadenopathy. No mediastinal hilar lymphadenopathy. Esophagus is normal.  Review of the lung parenchyma demonstrates nodular atelectasis at the right lung base measuring 15 x 19 mm (image 92). There is a thin-walled cyst at  the right lung base additionally.  Review of the MIP images confirms the above findings.  CTA ABDOMEN FINDINGS  No evidence of dissection or aneurysm of the abdominal aorta. The SMA and celiac trunk are patent. 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 There is ostial calcifications of the left and right renal arteries. There is bi-iliac bypass graft anatomy which is incompletely image. The IMA is diminutive in likely sacrificed and back filling.  No focal hepatic lesion. There are multiple gallstones layering within the gallbladder lumen. The pancreas, spleen, adrenal glands are normal. There is bilateral nonenhancing renal cysts  The stomach limited view of the small bowel and colon are unremarkable. Limited view of the appendix is normal. No retroperitoneal  lymphadenopathy.  Review of the MIP images confirms the above findings.  IMPRESSION: 1. No evidence of aortic dissection or aneurysm of the thoracic aorta or abdominal aorta.  2. No evidence of pulmonary embolism.  3.  Extensive coronary artery calcifications noted.  4. Nodular atelectasis at the right lung base. Recommend followup CT in 3 months.  5. 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 Extensive atherosclerotic disease of the renal arteries.  6. Cholelithiasis without cholecystitis.   Electronically Signed   By: Genevive Bi M.D.   On: 12/03/2012 20:30   EKG: atrial fibrillation, 77 bpm, RBBB, downsloping ST depressions III, aVF, V3-V6 (unchanged from prior tracings)  ASSESSMENT AND PLAN:   1. Epigastric/inferior chest discomfort 2. GERD 3. Possible incisional hernia 4. AAA s/p repair  5. Constipation 6. CAD s/p PTCA x 2, unsuccessful PCI in 05/2012 7. Permanent atrial fibrillation 8. Mild AS 9. Hypertension 10. Hyperlipidemia 11. PVD 12. RLL nodular atelectasis on chest CT  The patient presents after an episode of epigastric discomfort. He consumed carrot salad which he thinks may have upset his stomach. He had associated nausea and has been constipated for a few days. Suspect GI etiology. From a cardiac perspective, the discomfort is clearly distinct from his anginal pain. There has been no limitation to his functional capacity- walks 2 miles on hilly terrain without incident. EKG indicates no ischemic change from baseline. TnI WNL x 3. CT-A indicates no acute process. He has diffuse atherosclerosis as noted above. RLL nodular atelectasis evident. Follow-up CT in 3 months recommended. Note, there is tenderness to palpation and a small mass noted on exam surrounding the patient's AAA scar. ? Incisional hernia. No evidence of strangulated bowel on CT-A. Will add milk of magnesia for stool softener. No further work-up or changes from a cardiac perspective. Continue home meds  including Coumadin.   Signed, R. Hurman Horn, PA-C 12/04/2012, 1:09 PM   Patient seen and examined with Hurman Horn, PA-C. We discussed all aspects of the encounter. I agree with the assessment and plan as stated above. Symptoms clearly not anginal in nature. ECG and CEs are negative. No further cardiac w/u needed at this point. He is tender at the site of his previous incisional hernia repair but we reviewed CT and no obvious evidence of problem at the site. I d/w Dr. Isidoro Donning. OK for d/c from cardiac standpoint. Can discuss with [facility] if needed.   Paraskevi Funez,MD 1:55 PM   Addendum:  Patient's symptoms recurred after eating lunch. Seen by [facility] and now pending cholecystectomy tomorrow. We are asked to give pre-op cardiac eval. Given high-grade non-revascularizable RCA lesion he is at increased risk for peri-op cardiac events.  In light of his well preserved exercise capacity I would classify this risk as moderate and would not be prohibitive for him to go to surgery. With INR < 2.0 would bridge with IV heparin prior to OR.   Truman Hayward 4:46 PM

## 2012-12-04 NOTE — Progress Notes (Signed)
ANTICOAGULATION CONSULT NOTE - Follow Up Consult  Pharmacy Consult for Coumadin Indication: atrial fibrillation  No Known Allergies  Patient Measurements: Height: 5\' 9"  (175.3 cm) Weight: 191 lb 4.8 oz (86.773 kg) IBW/kg (Calculated) : 70.7  Vital Signs: Temp: 98.1 F (36.7 C) (10/22 0644) Temp src: Axillary (10/22 0644) BP: 131/68 mmHg (10/22 1009) Pulse Rate: 67 (10/22 1009)  Labs:  Recent Labs  12/02/12 0952 12/03/12 1803 12/03/12 2026 12/04/12 0118 12/04/12 0538 12/04/12 1049  HGB  --  16.3  --   --   --   --   HCT  --  46.4  --   --   --   --   PLT  --  142*  --   --   --   --   LABPROT  --   --  22.9*  --  20.7*  --   INR 2.8  --  2.10*  --  1.84*  --   CREATININE  --  0.79  --   --   --   --   TROPONINI  --   --   --  <0.30 <0.30 <0.30    Estimated Creatinine Clearance: 75 ml/min (by C-G formula based on Cr of 0.79).   Medications:  Prescriptions prior to admission  Medication Sig Dispense Refill  . atorvastatin (LIPITOR) 20 MG tablet Take 20 mg by mouth every evening.      . Cholecalciferol (VITAMIN D-3) 5000 UNITS TABS Take 5,000 Units by mouth daily.       . Coenzyme Q10 (CO Q 10) 100 MG CAPS Take 1 capsule by mouth 2 (two) times daily.      Marland Kitchen ezetimibe (ZETIA) 10 MG tablet Take 10 mg by mouth every evening.      . hydrochlorothiazide (MICROZIDE) 12.5 MG capsule Take 1 capsule (12.5 mg total) by mouth daily before breakfast.  30 capsule  6  . ibuprofen (ADVIL,MOTRIN) 200 MG tablet Take 400 mg by mouth every 6 (six) hours as needed for pain.      . isosorbide mononitrate (IMDUR) 60 MG 24 hr tablet Take 1 tablet (60 mg total) by mouth daily.  30 tablet  4  . LORazepam (ATIVAN) 1 MG tablet Take 0.5-1 mg by mouth every 8 (eight) hours as needed for anxiety.       . metoprolol succinate (TOPROL-XL) 50 MG 24 hr tablet Take 1 tablet (50 mg total) by mouth 2 (two) times daily. Take with or immediately following a meal.  60 tablet  3  . Multiple Vitamins-Minerals  (CENTRUM SILVER PO) Take 1 tablet by mouth daily.      . Multiple Vitamins-Minerals (OCUVITE PRESERVISION PO) Take 1 tablet by mouth daily.      . nitroGLYCERIN (NITROSTAT) 0.4 MG SL tablet Place 1 tablet (0.4 mg total) under the tongue every 5 (five) minutes x 3 doses as needed for chest pain.  30 tablet  4  . omega-3 acid ethyl esters (LOVAZA) 1 G capsule Take 1 g by mouth daily.      . Psyllium (METAMUCIL) 30.9 % POWD Take 1 packet by mouth 3 times/day as needed-between meals & bedtime.       . ramipril (ALTACE) 10 MG capsule Take 10 mg by mouth 2 (two) times daily.      . Ranibizumab (LUCENTIS) 0.5 MG/0.05ML SOLN Inject 0.5 mg into the eye See admin instructions. Pt gets this injection every 9 weeks. Next treatment is due on 01-18-12. Pt is followed by  [facility].  Gets 1 shot at a time, one week apart.      . warfarin (COUMADIN) 5 MG tablet Take 5-7.5 mg by mouth See admin instructions. Takes 7.5 mg (1.5 tablets) on mondays and 5 mg (1 tablet) all other days        Assessment: 79 yo M admitted last evening with chest/abdominal pain.  Preliminary work-up negative.  Pt on Coumadin PTA for hx afib.  Of note, Coumadin dose last PM was not taken at home PTA and was given early this morning after admission.  INR currently subtherapeutic, perhaps as a reflection of delayed dose last PM.  Goal of Therapy:  INR 2-3 Monitor platelets by anticoagulation protocol: Yes   Plan:  Coumadin 5mg  PO x 1 tonight. Continue daily INR.  [employer], Pharm.D., BCPS Clinical Pharmacist Pager [phone] 12/04/2012 12:47 PM

## 2012-12-04 NOTE — Progress Notes (Signed)
PHARMACIST - PHYSICIAN ORDER COMMUNICATION  CONCERNING: P&T Medication Policy on Herbal Medications  DESCRIPTION:  This patient's order for:  Co-q10  has been noted.  This product(s) is classified as an "herbal" or natural product. Due to a lack of definitive safety studies or FDA approval, nonstandard manufacturing practices, plus the potential risk of unknown drug-drug interactions while on inpatient medications, the Pharmacy and Therapeutics Committee does not permit the use of "herbal" or natural products of this type within [facility].   ACTION TAKEN: The pharmacy department is unable to verify this order at this time and your patient has been informed of this safety policy. Please reevaluate patient's clinical condition at discharge and address if the herbal or natural product(s) should be resumed at that time.   

## 2012-12-04 NOTE — Progress Notes (Signed)
Utilization review completed.  

## 2012-12-04 NOTE — Progress Notes (Signed)
ANTICOAGULATION CONSULT NOTE - Follow Up Consult  Pharmacy Consult for Heparin Indication: atrial fibrillation  No Known Allergies  Patient Measurements: Height: 5\' 9"  (175.3 cm) Weight: 191 lb 4.8 oz (86.773 kg) IBW/kg (Calculated) : 70.7 Heparin Dosing Weight: 86.8 kg  Vital Signs: Temp: 98.3 F (36.8 C) (10/22 1359) Temp src: Oral (10/22 1359) BP: 127/72 mmHg (10/22 1359) Pulse Rate: 67 (10/22 1009)  Labs:  Recent Labs  12/02/12 0952 12/03/12 1803 12/03/12 2026 12/04/12 0118 12/04/12 0538 12/04/12 1049  HGB  --  16.3  --   --   --   --   HCT  --  46.4  --   --   --   --   PLT  --  142*  --   --   --   --   LABPROT  --   --  22.9*  --  20.7*  --   INR 2.8  --  2.10*  --  1.84*  --   CREATININE  --  0.79  --   --   --   --   TROPONINI  --   --   --  <0.30 <0.30 <0.30    Estimated Creatinine Clearance: 75 ml/min (by C-G formula based on Cr of 0.79).   Medications:  Scheduled:  . atorvastatin  20 mg Oral QPM  . cholecalciferol  5,000 Units Oral Daily  . [START ON 12/05/2012] ciprofloxacin  400 mg Intravenous On Call to OR  . docusate sodium  200 mg Oral BID  . ezetimibe  10 mg Oral QPM  . hydrochlorothiazide  12.5 mg Oral QAC breakfast  . isosorbide mononitrate  60 mg Oral Daily  . metoprolol succinate  50 mg Oral BID  . multivitamin with minerals  1 tablet Oral Daily  . omega-3 acid ethyl esters  1 g Oral Daily  . phytonadione  5 mg Subcutaneous Once  . psyllium  1 packet Oral Daily  . ramipril  10 mg Oral BID  . sodium chloride  3 mL Intravenous Q12H  . sodium chloride  3 mL Intravenous Q12H   Infusions:    Assessment: 79 year old male admitted with abdominal pain found to have cholelithiasis.  He is scheduled for cholecystectomy on 10/23, and he is on chronic anticoagulation with Coumadin.  His INR this morning was slightly subtherapeutic, and he is to receive Vitamin K 5mg  SQ for reversal.  Heparin bridging is requested.  Goal of Therapy:  Heparin  level 0.3-0.7 units/ml Monitor platelets by anticoagulation protocol: Yes   Plan:  No Heparin bolus due to INR 1.8 Start Heparin infusion at 1200 units/hr (~14 units/kg/hr) Check Heparin level in 8 hours  Estella Husk, Pharm.D., BCPS, AAHIVP Clinical Pharmacist Phone: [phone] or [phone] 12/04/2012, 5:04 PM

## 2012-12-04 NOTE — Consult Note (Signed)
Patient interviewed and examined, agree with PA note above. His presentation is typical for acute biliary pain. He remains tender in the right upper quadrant. I believe he would be best served with cholecystectomy. I discussed the procedure in detail with him. He would like to think this over and defer his decision in until tomorrow. We will be ready to proceed if that is his wish. Mariella Saa MD, FACS  12/04/2012 5:42 PM

## 2012-12-04 NOTE — Progress Notes (Signed)
Patient is considering surgery outpatient. IV heparin and subq. Vitamin K withheld until patient decides. Dr. Isidoro Donning notified.

## 2012-12-04 NOTE — Progress Notes (Signed)
Patient ID: Tyler Williams  male  ZOX:[id number]    DOB: [birth year]    DOA: 12/03/2012  PCP: Minda Meo, MD  Assessment/Plan: Principal Problem:   Chest pain, atypical with known history of coronary artery disease, cardiac cath in for 2014, had a 99% mid RCA lesion, unsuccessful PCI, unable to use Rotablator.  - Ruled out acute ACS however given high risk patient cardiology was consulted. - Per Dr. Gala Romney, no further cardiac workup is needed at this time, cardiac enzymes are negative and no acute EKG changes, symptoms likely not anginal. However recommended vascular surgery to see the patient for tenderness at the site of previous incisional hernia repair (CT abdomen pelvis did not show any incarcerated hernia or obstruction.  Abdominal pain: Likely due to a known history of cholelithiasis - CT abdomen pelvis had shown cholelithiasis, had right upper quadrant tenderness in a.m., patient reports no history of cholelithiasis and he had no desire any surgery in the past. - General surgery evaluation requested, will await for their recommendations, may follow outpatient for cholecystectomy if desired  Active Problems:    Atrial fibrillation - Currently rate controlled on Coumadin    Aortic stenosis - Follow outpatient with cardiology,  DVT Prophylaxis:  Code Status:  Disposition: Awaiting general surgery recommendations, possible DC home today if no surgery during this hospitalization is recommended    Subjective: At the time of my encounter early this morning, patient still was tender in the right upper quadrant region, no chest pain. No shortness of breath no nausea vomiting. Patient was hungry and requesting breakfast.  Objective: Weight change:   Intake/Output Summary (Last 24 hours) at 12/04/12 1504 Last data filed at 12/04/12 1300  Gross per 24 hour  Intake    360 ml  Output      0 ml  Net    360 ml   Blood pressure 127/72, pulse 67, temperature 98.3 F (36.8  C), temperature source Oral, resp. rate 17, height 5\' 9"  (1.753 m), weight 86.773 kg (191 lb 4.8 oz), SpO2 98.00%.  Physical Exam: General: Alert and awake, oriented x3, not in any acute distress. CVS: S1-S2 clear, 2/6 SEM at RUSB Chest: clear to auscultation bilaterally, no wheezing, rales or rhonchi Abdomen: soft mild tenderness in the right upper quadrant region  nondistended, normal bowel sounds  Extremities: no cyanosis, clubbing or edema noted bilaterally Neuro: Cranial nerves II-XII intact, no focal neurological deficits  Lab Results: Basic Metabolic Panel:  Recent Labs Lab 12/03/12 1803  NA 138  K 4.2  CL 102  CO2 26  GLUCOSE 120*  BUN 18  CREATININE 0.79  CALCIUM 9.0   Liver Function Tests: No results found for this basename: AST, ALT, ALKPHOS, BILITOT, PROT, ALBUMIN,  in the last 168 hours No results found for this basename: LIPASE, AMYLASE,  in the last 168 hours No results found for this basename: AMMONIA,  in the last 168 hours CBC:  Recent Labs Lab 12/03/12 1803  WBC 9.8  HGB 16.3  HCT 46.4  MCV 93.9  PLT 142*   Cardiac Enzymes:  Recent Labs Lab 12/04/12 0118 12/04/12 0538 12/04/12 1049  TROPONINI <0.30 <0.30 <0.30   BNP: No components found with this basename: POCBNP,  CBG: No results found for this basename: GLUCAP,  in the last 168 hours   Micro Results: No results found for this or any previous visit (from the past 240 hour(s)).  Studies/Results: Dg Chest 2 View  12/03/2012   CLINICAL DATA:  Chest  pain and back pain  EXAM: CHEST  2 VIEW  COMPARISON:  05/22/2012  FINDINGS: Negative for heart failure or pneumonia. Negative for infiltrate or effusion or mass lesion. Overall improved aeration from the prior study.  IMPRESSION: No active cardiopulmonary disease.   Electronically Signed   By: Marlan Palau M.D.   On: 12/03/2012 18:38   Ct Angio Abdomen W/cm &/or Wo Contrast  12/03/2012   CLINICAL DATA:  Rule out dissection. Mid chest  pain.  EXAM: CT ANGIOGRAPHY CHEST within without AND ABDOMEN with  TECHNIQUE: Initial noncontrast CT the chest. Multidetector CT imaging of the chest and abdomen was performed using the standard protocol during bolus administration of intravenous contrast. Multiplanar CT image reconstructions including MIPs were obtained to evaluate the vascular anatomy.  CONTRAST:  OMNIPAQUE IOHEXOL 350 MG/ML SOLN  COMPARISON:  None.  FINDINGS: CTA CHEST FINDINGS  Non IV contrast images demonstrate no intramural hematoma within the thoracic aorta. Contrast enhanced imaging demonstrates no dissection or aneurysm of the ascending thoracic aorta, transverse thoracic aorta, or descending thoracic aorta. There is moderate intimal calcification. The great vessels are normal.  There is no central pulmonary embolism present. No pericardial fluid. Coronary artery calcifications are noted in all 3 coronary arteries.  No axillary supraclavicular lymphadenopathy. No mediastinal hilar lymphadenopathy. Esophagus is normal.  Review of the lung parenchyma demonstrates nodular atelectasis at the right lung base measuring 15 x 19 mm (image 92). There is a thin-walled cyst at the right lung base additionally.  Review of the MIP images confirms the above findings.  CTA ABDOMEN FINDINGS  No evidence of dissection or aneurysm of the abdominal aorta. The SMA and celiac trunk are patent. 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 There is ostial calcifications of the left and right renal arteries. There is bi-iliac bypass graft anatomy which is incompletely image. The IMA is diminutive in likely sacrificed and back filling.  No focal hepatic lesion. There are multiple gallstones layering within the gallbladder lumen. The pancreas, spleen, adrenal glands are normal. There is bilateral nonenhancing renal cysts  The stomach limited view of the small  bowel and colon are unremarkable. Limited view of the appendix is normal. No retroperitoneal lymphadenopathy.  Review of the MIP images confirms the above findings.  IMPRESSION: 1. No evidence of aortic dissection or aneurysm of the thoracic aorta or abdominal aorta.  2. No evidence of pulmonary embolism.  3.  Extensive coronary artery calcifications noted.  4. Nodular atelectasis at the right lung base. Recommend followup CT in 3 months.  5. 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 Extensive atherosclerotic disease of the renal arteries.  6. Cholelithiasis without cholecystitis.   Electronically Signed   By: Genevive Bi M.D.   On: 12/03/2012 20:30   Ct Angio Chest Aortic Dissect W &/or W/o  12/03/2012   CLINICAL DATA:  Rule out dissection. Mid chest pain.  EXAM: CT ANGIOGRAPHY CHEST within without AND ABDOMEN with  TECHNIQUE: Initial noncontrast CT the chest. Multidetector CT imaging of the chest and abdomen was performed using the standard protocol during bolus administration of intravenous contrast. Multiplanar CT image reconstructions including MIPs were obtained to evaluate the vascular anatomy.  CONTRAST:  OMNIPAQUE IOHEXOL 350 MG/ML SOLN  COMPARISON:  None.  FINDINGS: CTA CHEST FINDINGS  Non IV contrast images demonstrate no intramural hematoma within the thoracic aorta. Contrast enhanced imaging demonstrates no dissection or aneurysm  of the ascending thoracic aorta, transverse thoracic aorta, or descending thoracic aorta. There is moderate intimal calcification. The great vessels are normal.  There is no central pulmonary embolism present. No pericardial fluid. Coronary artery calcifications are noted in all 3 coronary arteries.  No axillary supraclavicular lymphadenopathy. No mediastinal hilar lymphadenopathy. Esophagus is normal.  Review of the lung parenchyma demonstrates nodular atelectasis at the right lung base measuring 15 x 19 mm (image 92). There is a thin-walled cyst at the  right lung base additionally.  Review of the MIP images confirms the above findings.  CTA ABDOMEN FINDINGS  No evidence of dissection or aneurysm of the abdominal aorta. The SMA and celiac trunk are patent. 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 There is ostial calcifications of the left and right renal arteries. There is bi-iliac bypass graft anatomy which is incompletely image. The IMA is diminutive in likely sacrificed and back filling.  No focal hepatic lesion. There are multiple gallstones layering within the gallbladder lumen. The pancreas, spleen, adrenal glands are normal. There is bilateral nonenhancing renal cysts  The stomach limited view of the small bowel and colon are unremarkable. Limited view of the appendix is normal. No retroperitoneal lymphadenopathy.  Review of the MIP images confirms the above findings.  IMPRESSION: 1. No evidence of aortic dissection or aneurysm of the thoracic aorta or abdominal aorta.  2. No evidence of pulmonary embolism.  3.  Extensive coronary artery calcifications noted.  4. Nodular atelectasis at the right lung base. Recommend followup CT in 3 months.  5. 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 Extensive atherosclerotic disease of the renal arteries.  6. Cholelithiasis without cholecystitis.   Electronically Signed   By: Genevive Bi M.D.   On: 12/03/2012 20:30    Medications: Scheduled Meds: . atorvastatin  20 mg Oral QPM  . cholecalciferol  5,000 Units Oral Daily  . docusate sodium  200 mg Oral BID  . ezetimibe  10 mg Oral QPM  . hydrochlorothiazide  12.5 mg Oral QAC breakfast  . isosorbide mononitrate  60 mg Oral Daily  . metoprolol succinate  50 mg Oral BID  . multivitamin with minerals  1 tablet Oral Daily  . omega-3 acid ethyl esters  1 g Oral Daily  . psyllium  1 packet Oral Daily  . ramipril  10 mg Oral BID  . sodium chloride  3  mL Intravenous Q12H  . sodium chloride  3 mL Intravenous Q12H  . warfarin  5 mg Oral ONCE-1800  . Warfarin - Pharmacist Dosing Inpatient   Does not apply q1800      LOS: 1 day   Benay Pomeroy M.D. [facility] 12/04/2012, 3:04 PM Pager: [phone]  If 7PM-7AM, please contact night-coverage [email or web address] Password [email or web address]

## 2012-12-04 NOTE — Consult Note (Signed)
Vascular Surgery Consultation  Reason for Consult: Acute chest and abdominal pain yesterday  HPI: Tyler Williams is a 79 y.o. male who presents for evaluation of acute chest and abdominal pain 24 hours ago. This patient is known to me having undergone resection and grafting of abdominal aortic aneurysm 21 years ago. Apparently he developed a ventral hernia several weeks postop and required repair. He has done well from that standpoint over the years. Yesterday he developed acute onset of chest pain and abdominal discomfort and was admitted to the hospital. He has not been having abdominal pain recently. He has not noticed a recurrent ventral hernia. He had cardiac evaluation and a cardiac etiology of the pain has been ruled out. Patient also had a CT scan of the chest and abdomen which ruled out aortic dissection or problems with the aortic graft. Pain has now subsided according to the patient and he is tolerating diet. He's had no nausea and vomiting. He was also found to have cholelithiasis on his CT of the abdomen.   Past Medical History  Diagnosis Date  . CAD (coronary artery disease)     a. s/p multiple percutaneous prior coronary artery interventions b. 05/2012: unsuccessful PCI to tight RCA->medically managed  . Permanent atrial fibrillation   . PVD (peripheral vascular disease)     Bilateral renal artery atherosclerosis, SMA stenosis with collateralization  . Hypercholesteremia   . SSS (sick sinus syndrome)   . Hypertension   . GERD (gastroesophageal reflux disease)   . History of skin cancer   . Arthritis     knees  . History of pulmonary embolism     1964 related to dislocated hip on right   . Chronic anticoagulation     Coumadin  . Aortic stenosis, mild     a. mean gradient 17 mmHg on 04/2012 TTE  . Hypertension   . Microscopic hematuria     Followed by urology  . RBBB     Chronic  . AAA (abdominal aortic aneurysm)     s/p repair  . GERD (gastroesophageal reflux  disease)   . Carotid artery disease     0-39% bilateral ICA stenoses 11/2011   Past Surgical History  Procedure Laterality Date  . Orthopedic surgery    . Coronary angioplasty  1980, 1990  . Hernia repair      right inguinal hernia repair   . Abdominal aortic aneurysm repair      1993   . Other surgical history      right knee popliteal aneurysm surgery stent placed   . Tonsillectomy    . Other surgical history    . Hemorrhoid surgery      1973  . Eye surgery      hx of cataract surgery   . Partial knee arthroplasty  01/22/2012    Procedure: UNICOMPARTMENTAL KNEE;  Surgeon: [name], MD;  Location: [facility];  Service: Orthopedics;  Laterality: Right;  . Joint replacement      left knee replacement, partial right  . Cardiac catheterization  05/24/2012    pD1 20%, oCFX 20%, mCFX 30%, ostial Int Br 30%, distal AV groove CFX 30%, pRCA 30-40%, mRCA 99%, dRCA 30%, PL branch small with diffuse 40%. Unable to pass wire past RCA lesion->medically managed   History   Social History  . Marital Status: Married    Spouse Name: N/A    Number of Children: N/A  . Years of Education: N/A   Social History Main  Topics  . Smoking status: Former Smoker    Types: Cigarettes    Quit date: 02/14/1983  . Smokeless tobacco: Never Used  . Alcohol Use: 4.2 oz/week    7 Glasses of wine per week     Comment: 5 ounces of wine daily   . Drug Use: No  . Sexual Activity: None   Other Topics Concern  . None   Social History Narrative  . None   Family History  Problem Relation Age of Onset  . Heart attack Mother 46   No Known Allergies Prior to Admission medications   Medication Sig Start Date End Date Taking? Authorizing Provider  atorvastatin (LIPITOR) 20 MG tablet Take 20 mg by mouth every evening.   Yes Historical Provider, MD  Cholecalciferol (VITAMIN D-3) 5000 UNITS TABS Take 5,000 Units by mouth daily.    Yes Historical Provider, MD  Coenzyme Q10 (CO Q 10) 100 MG CAPS Take 1 capsule  by mouth 2 (two) times daily.   Yes Historical Provider, MD  ezetimibe (ZETIA) 10 MG tablet Take 10 mg by mouth every evening.   Yes Historical Provider, MD  hydrochlorothiazide (MICROZIDE) 12.5 MG capsule Take 1 capsule (12.5 mg total) by mouth daily before breakfast. 07/18/12  Yes Herby Abraham, MD  ibuprofen (ADVIL,MOTRIN) 200 MG tablet Take 400 mg by mouth every 6 (six) hours as needed for pain.   Yes Historical Provider, MD  isosorbide mononitrate (IMDUR) 60 MG 24 hr tablet Take 1 tablet (60 mg total) by mouth daily. 10/18/12  Yes Tonny Bollman, MD  LORazepam (ATIVAN) 1 MG tablet Take 0.5-1 mg by mouth every 8 (eight) hours as needed for anxiety.    Yes Historical Provider, MD  metoprolol succinate (TOPROL-XL) 50 MG 24 hr tablet Take 1 tablet (50 mg total) by mouth 2 (two) times daily. Take with or immediately following a meal. 10/31/12  Yes Tonny Bollman, MD  Multiple Vitamins-Minerals (CENTRUM SILVER PO) Take 1 tablet by mouth daily.   Yes Historical Provider, MD  Multiple Vitamins-Minerals (OCUVITE PRESERVISION PO) Take 1 tablet by mouth daily.   Yes Historical Provider, MD  nitroGLYCERIN (NITROSTAT) 0.4 MG SL tablet Place 1 tablet (0.4 mg total) under the tongue every 5 (five) minutes x 3 doses as needed for chest pain. 05/25/12  Yes Brooke O Edmisten, PA-C  omega-3 acid ethyl esters (LOVAZA) 1 G capsule Take 1 g by mouth daily.   Yes Historical Provider, MD  Psyllium (METAMUCIL) 30.9 % POWD Take 1 packet by mouth 3 times/day as needed-between meals & bedtime.    Yes Historical Provider, MD  ramipril (ALTACE) 10 MG capsule Take 10 mg by mouth 2 (two) times daily.   Yes Historical Provider, MD  Ranibizumab (LUCENTIS) 0.5 MG/0.05ML SOLN Inject 0.5 mg into the eye See admin instructions. Pt gets this injection every 9 weeks. Next treatment is due on 01-18-12. Pt is followed by [facility].  Gets 1 shot at a time, one week apart.   Yes Historical Provider, MD  warfarin (COUMADIN) 5 MG  tablet Take 5-7.5 mg by mouth See admin instructions. Takes 7.5 mg (1.5 tablets) on mondays and 5 mg (1 tablet) all other days   Yes Historical Provider, MD     Positive ROS: Denies chest pain, dyspnea on exertion, PND, orthopnea, on a chronic basis. Did have chest pain yesterday which is unusual. Does have history of cardiac stent in the 1980s and again in the 1990s denies claudication symptoms.  All other systems  have been reviewed and were otherwise negative with the exception of those mentioned in the HPI and as above.  Physical Exam: Filed Vitals:   12/04/12 1359  BP: 127/72  Pulse:   Temp: 98.3 F (36.8 C)  Resp: 17    General: Alert, no acute distress HEENT: Normal for age Cardiovascular: Regular rate and rhythm. Carotid pulses 2+, no bruits audible Respiratory: Clear to auscultation. No cyanosis, no use of accessory musculature GI: No organomegaly, abdomen is soft and non-tender there is a diffuse diastasis of the upper abdominal wound. No focal hernia is noted. There is no evidence of incarceration or tenderness to palpation. Skin: No lesions in the area of chief complaint Neurologic: Sensation intact distally Psychiatric: Patient is competent for consent with normal mood and affect Musculoskeletal: No obvious deformities Extremities: 3+ femoral pulses palpable bilaterally both feet well perfused  Labs reviewed:   Imaging reviewed: CT of the abdomen-cholelithiasis-no evidence of aortic dissection   Assessment/Plan:  Patient 21 years post abdominal aortic aneurysm resection admitted now with chest and abdominal discomfort which has resolved No evidence of cardiac event Suspect that symptoms are most likely due to the cholelithiasis. The upper abdominal ventral hernia is not tender and it is doubtful that this is the etiology of the patient's symptoms Recommend Gen. surgery consult   Josephina Gip, MD 12/04/2012 2:17 PM

## 2012-12-04 NOTE — Consult Note (Signed)
Reason for Consult:cholelithiasis  Referring Physician: Ripudeep Rai   HPI: Tyler Williams is a 79 year old male with a history of CAD s/p unsuccessful PCI 4/14, aortic stenosis, PAF, warfarin therapy,GERD, HTN, HLD who presented yesterday morning with atypical chest pain and abdominal pain.  Onset was sudden several hours after breakfast after consuming carrot salad.  He denies fever, chills or sweats.  Associated with bloating, gas.  Location of pain is; generalized.  Mild to moderate in severity.  Last BM yesterday morning. Denies weight loss, nausea or vomiting.  Denies melena or hematochezia.  His cardiac evaluation was negative for ACS.  He had a CTA of abdomen which showed cholelithiasis. There was no evidence of aortic dissection.  Normal white count 10/21.    Past Medical History  Diagnosis Date  . CAD (coronary artery disease)     a. s/p multiple percutaneous prior coronary artery interventions b. 05/2012: unsuccessful PCI to tight RCA->medically managed  . Permanent atrial fibrillation   . PVD (peripheral vascular disease)     Bilateral renal artery atherosclerosis, SMA stenosis with collateralization  . Hypercholesteremia   . SSS (sick sinus syndrome)   . Hypertension   . GERD (gastroesophageal reflux disease)   . History of skin cancer   . Arthritis     knees  . History of pulmonary embolism     1964 related to dislocated hip on right   . Chronic anticoagulation     Coumadin  . Aortic stenosis, mild     a. mean gradient 17 mmHg on 04/2012 TTE  . Hypertension   . Microscopic hematuria     Followed by urology  . RBBB     Chronic  . AAA (abdominal aortic aneurysm)     s/p repair  . GERD (gastroesophageal reflux disease)   . Carotid artery disease     0-39% bilateral ICA stenoses 11/2011    Past Surgical History  Procedure Laterality Date  . Orthopedic surgery    . Coronary angioplasty  1980, 1990  . Hernia repair      right inguinal hernia repair   . Abdominal  aortic aneurysm repair      1993   . Other surgical history      right knee popliteal aneurysm surgery stent placed   . Tonsillectomy    . Other surgical history    . Hemorrhoid surgery      1973  . Eye surgery      hx of cataract surgery   . Partial knee arthroplasty  01/22/2012    Procedure: UNICOMPARTMENTAL KNEE;  Surgeon: [name], MD;  Location: [facility];  Service: Orthopedics;  Laterality: Right;  . Joint replacement      left knee replacement, partial right  . Cardiac catheterization  05/24/2012    pD1 20%, oCFX 20%, mCFX 30%, ostial Int Br 30%, distal AV groove CFX 30%, pRCA 30-40%, mRCA 99%, dRCA 30%, PL branch small with diffuse 40%. Unable to pass wire past RCA lesion->medically managed    Family History  Problem Relation Age of Onset  . Heart attack Mother 52    Social History:  reports that he quit smoking about 29 years ago. His smoking use included Cigarettes. He smoked 0.00 packs per day. He has never used smokeless tobacco. He reports that he drinks about 4.2 ounces of alcohol per week. He reports that he does not use illicit drugs.  Allergies: No Known Allergies  Medications: I have reviewed the patient's current  medications.  Results for orders placed during the hospital encounter of 12/03/12 (from the past 48 hour(s))  CBC     Status: Abnormal   Collection Time    12/03/12  6:03 PM      Result Value Range   WBC 9.8  4.0 - 10.5 K/uL   RBC 4.94  4.22 - 5.81 MIL/uL   Hemoglobin 16.3  13.0 - 17.0 g/dL   HCT 16.1  09.6 - 04.5 %   MCV 93.9  78.0 - 100.0 fL   MCH 33.0  26.0 - 34.0 pg   MCHC 35.1  30.0 - 36.0 g/dL   RDW 40.9  81.1 - 91.4 %   Platelets 142 (*) 150 - 400 K/uL  BASIC METABOLIC PANEL     Status: Abnormal   Collection Time    12/03/12  6:03 PM      Result Value Range   Sodium 138  135 - 145 mEq/L   Potassium 4.2  3.5 - 5.1 mEq/L   Chloride 102  96 - 112 mEq/L   CO2 26  19 - 32 mEq/L   Glucose, Bld 120 (*) 70 - 99 mg/dL   BUN 18  6 - 23  mg/dL   Creatinine, Ser 7.82  0.50 - 1.35 mg/dL   Calcium 9.0  8.4 - 95.6 mg/dL   GFR calc non Af Amer 80 (*) >90 mL/min   GFR calc Af Amer >90  >90 mL/min   Comment: (NOTE)     The eGFR has been calculated using the CKD EPI equation.     This calculation has not been validated in all clinical situations.     eGFR's persistently <90 mL/min signify possible Chronic Kidney     Disease.  POCT I-STAT TROPONIN I     Status: None   Collection Time    12/03/12  6:30 PM      Result Value Range   Troponin i, poc 0.02  0.00 - 0.08 ng/mL   Comment 3            Comment: Due to the release kinetics of cTnI,     a negative result within the first hours     of the onset of symptoms does not rule out     myocardial infarction with certainty.     If myocardial infarction is still suspected,     repeat the test at appropriate intervals.  URINALYSIS, ROUTINE W REFLEX MICROSCOPIC     Status: Abnormal   Collection Time    12/03/12  6:46 PM      Result Value Range   Color, Urine YELLOW  YELLOW   APPearance HAZY (*) CLEAR   Specific Gravity, Urine 1.019  1.005 - 1.030   pH 5.0  5.0 - 8.0   Glucose, UA NEGATIVE  NEGATIVE mg/dL   Hgb urine dipstick MODERATE (*) NEGATIVE   Bilirubin Urine NEGATIVE  NEGATIVE   Ketones, ur NEGATIVE  NEGATIVE mg/dL   Protein, ur 30 (*) NEGATIVE mg/dL   Urobilinogen, UA 0.2  0.0 - 1.0 mg/dL   Nitrite NEGATIVE  NEGATIVE   Leukocytes, UA NEGATIVE  NEGATIVE  URINE MICROSCOPIC-ADD ON     Status: None   Collection Time    12/03/12  6:46 PM      Result Value Range   Squamous Epithelial / LPF RARE  RARE   WBC, UA 0-2  <3 WBC/hpf   RBC / HPF 7-10  <3 RBC/hpf   Bacteria, UA RARE  RARE   Urine-Other MUCOUS PRESENT    TYPE AND SCREEN     Status: None   Collection Time    12/03/12  8:25 PM      Result Value Range   ABO/RH(D) A POS     Antibody Screen NEG     Sample Expiration 12/06/2012    ABO/RH     Status: None   Collection Time    12/03/12  8:25 PM      Result  Value Range   ABO/RH(D) A POS    PROTIME-INR     Status: Abnormal   Collection Time    12/03/12  8:26 PM      Result Value Range   Prothrombin Time 22.9 (*) 11.6 - 15.2 seconds   INR 2.10 (*) 0.00 - 1.49  TROPONIN I     Status: None   Collection Time    12/04/12  1:18 AM      Result Value Range   Troponin I <0.30  <0.30 ng/mL   Comment:            Due to the release kinetics of cTnI,     a negative result within the first hours     of the onset of symptoms does not rule out     myocardial infarction with certainty.     If myocardial infarction is still suspected,     repeat the test at appropriate intervals.  TROPONIN I     Status: None   Collection Time    12/04/12  5:38 AM      Result Value Range   Troponin I <0.30  <0.30 ng/mL   Comment:            Due to the release kinetics of cTnI,     a negative result within the first hours     of the onset of symptoms does not rule out     myocardial infarction with certainty.     If myocardial infarction is still suspected,     repeat the test at appropriate intervals.  PROTIME-INR     Status: Abnormal   Collection Time    12/04/12  5:38 AM      Result Value Range   Prothrombin Time 20.7 (*) 11.6 - 15.2 seconds   INR 1.84 (*) 0.00 - 1.49  TROPONIN I     Status: None   Collection Time    12/04/12 10:49 AM      Result Value Range   Troponin I <0.30  <0.30 ng/mL   Comment:            Due to the release kinetics of cTnI,     a negative result within the first hours     of the onset of symptoms does not rule out     myocardial infarction with certainty.     If myocardial infarction is still suspected,     repeat the test at appropriate intervals.    Dg Chest 2 View  12/03/2012   CLINICAL DATA:  Chest pain and back pain  EXAM: CHEST  2 VIEW  COMPARISON:  05/22/2012  FINDINGS: Negative for heart failure or pneumonia. Negative for infiltrate or effusion or mass lesion. Overall improved aeration from the prior study.   IMPRESSION: No active cardiopulmonary disease.   Electronically Signed   By: Marlan Palau M.D.   On: 12/03/2012 18:38   Ct Angio Abdomen W/cm &/or Wo Contrast  12/03/2012   CLINICAL DATA:  Rule out dissection. Mid chest pain.  EXAM: CT ANGIOGRAPHY CHEST within without AND ABDOMEN with  TECHNIQUE: Initial noncontrast CT the chest. Multidetector CT imaging of the chest and abdomen was performed using the standard protocol during bolus administration of intravenous contrast. Multiplanar CT image reconstructions including MIPs were obtained to evaluate the vascular anatomy.  CONTRAST:  OMNIPAQUE IOHEXOL 350 MG/ML SOLN  COMPARISON:  None.  FINDINGS: CTA CHEST FINDINGS  Non IV contrast images demonstrate no intramural hematoma within the thoracic aorta. Contrast enhanced imaging demonstrates no dissection or aneurysm of the ascending thoracic aorta, transverse thoracic aorta, or descending thoracic aorta. There is moderate intimal calcification. The great vessels are normal.  There is no central pulmonary embolism present. No pericardial fluid. Coronary artery calcifications are noted in all 3 coronary arteries.  No axillary supraclavicular lymphadenopathy. No mediastinal hilar lymphadenopathy. Esophagus is normal.  Review of the lung parenchyma demonstrates nodular atelectasis at the right lung base measuring 15 x 19 mm (image 92). There is a thin-walled cyst at the right lung base additionally.  Review of the MIP images confirms the above findings.  CTA ABDOMEN FINDINGS  No evidence of dissection or aneurysm of the abdominal aorta. The SMA and celiac trunk are patent. 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 There is ostial calcifications of the left and right renal arteries. There is bi-iliac bypass graft anatomy which is incompletely image. The IMA is diminutive in likely sacrificed and back filling.  No  focal hepatic lesion. There are multiple gallstones layering within the gallbladder lumen. The pancreas, spleen, adrenal glands are normal. There is bilateral nonenhancing renal cysts  The stomach limited view of the small bowel and colon are unremarkable. Limited view of the appendix is normal. No retroperitoneal lymphadenopathy.  Review of the MIP images confirms the above findings.  IMPRESSION: 1. No evidence of aortic dissection or aneurysm of the thoracic aorta or abdominal aorta.  2. No evidence of pulmonary embolism.  3.  Extensive coronary artery calcifications noted.  4. Nodular atelectasis at the right lung base. Recommend followup CT in 3 months.  5. 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 Extensive atherosclerotic disease of the renal arteries.  6. Cholelithiasis without cholecystitis.   Electronically Signed   By: Genevive Bi M.D.   On: 12/03/2012 20:30   Ct Angio Chest Aortic Dissect W &/or W/o  12/03/2012   CLINICAL DATA:  Rule out dissection. Mid chest pain.  EXAM: CT ANGIOGRAPHY CHEST within without AND ABDOMEN with  TECHNIQUE: Initial noncontrast CT the chest. Multidetector CT imaging of the chest and abdomen was performed using the standard protocol during bolus administration of intravenous contrast. Multiplanar CT image reconstructions including MIPs were obtained to evaluate the vascular anatomy.  CONTRAST:  OMNIPAQUE IOHEXOL 350 MG/ML SOLN  COMPARISON:  None.  FINDINGS: CTA CHEST FINDINGS  Non IV contrast images demonstrate no intramural hematoma within the thoracic aorta. Contrast enhanced imaging demonstrates no dissection or aneurysm of the ascending thoracic aorta, transverse thoracic aorta, or descending thoracic aorta. There is moderate intimal calcification. The great vessels are normal.  There is no central pulmonary embolism present. No pericardial fluid. Coronary artery calcifications are noted in all 3 coronary arteries.  No axillary supraclavicular  lymphadenopathy. No mediastinal hilar lymphadenopathy. Esophagus is normal.  Review of the lung parenchyma demonstrates nodular atelectasis at the right lung base measuring 15 x 19 mm (image  92). There is a thin-walled cyst at the right lung base additionally.  Review of the MIP images confirms the above findings.  CTA ABDOMEN FINDINGS  No evidence of dissection or aneurysm of the abdominal aorta. The SMA and celiac trunk are patent. 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 There is ostial calcifications of the left and right renal arteries. There is bi-iliac bypass graft anatomy which is incompletely image. The IMA is diminutive in likely sacrificed and back filling.  No focal hepatic lesion. There are multiple gallstones layering within the gallbladder lumen. The pancreas, spleen, adrenal glands are normal. There is bilateral nonenhancing renal cysts  The stomach limited view of the small bowel and colon are unremarkable. Limited view of the appendix is normal. No retroperitoneal lymphadenopathy.  Review of the MIP images confirms the above findings.  IMPRESSION: 1. No evidence of aortic dissection or aneurysm of the thoracic aorta or abdominal aorta.  2. No evidence of pulmonary embolism.  3.  Extensive coronary artery calcifications noted.  4. Nodular atelectasis at the right lung base. Recommend followup CT in 3 months.  5. 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 Extensive atherosclerotic disease of the renal arteries.  6. Cholelithiasis without cholecystitis.   Electronically Signed   By: Genevive Bi M.D.   On: 12/03/2012 20:30    Review of Systems  Constitutional: Negative for fever, chills and weight loss.  Respiratory: Negative for shortness of breath.   Cardiovascular: Negative for chest pain and palpitations.  Gastrointestinal: Positive for abdominal pain. Negative for nausea,  vomiting, diarrhea, constipation, blood in stool and melena.  Genitourinary: Negative for dysuria and urgency.  Neurological: Negative for dizziness, seizures and loss of consciousness.   Blood pressure 127/72, pulse 67, temperature 98.3 F (36.8 C), temperature source Oral, resp. rate 17, height 5\' 9"  (1.753 m), weight 191 lb 4.8 oz (86.773 kg), SpO2 98.00%. Physical Exam  Constitutional: He is oriented to person, place, and time. He appears well-developed and well-nourished. No distress.  HENT:  Mouth/Throat: No oropharyngeal exudate.  Neck: Normal range of motion. Neck supple.  Cardiovascular: Exam reveals no gallop and no friction rub.   No murmur heard. Regularly irregular  Respiratory: Effort normal and breath sounds normal. No respiratory distress. He has no wheezes. He has no rales. He exhibits no tenderness.  GI: Soft. Bowel sounds are normal. He exhibits no mass.  positive murphy's sign with voluntary guarding Diastasis recti  Musculoskeletal: He exhibits no edema and no tenderness.  Neurological: He is alert and oriented to person, place, and time.  Skin: Skin is warm and dry. No rash noted. He is not diaphoretic. No erythema. No pallor.  Psychiatric: He has a normal mood and affect. His behavior is normal. Judgment and thought content normal.    Assessment/Plan: Symptomatic cholelithiasis: He is fairly tender to the right upper quadrant and symptoms are worse now after eating lunch.  He will likely require a laparoscopic cholecystectomy this admission. We will need cardiac clearance given his known history of CAD.  His warfarin will need to be reversed and less than 1.6 in order to proceed with surgery tomorrow.  This was discussed with the primary team. May have clears and then NPO after midnight for possible surgery in the morning pending INR reversal and cardiac clearance.    Daquarius Dubeau ANP-BC  12/04/2012, 4:09 PM

## 2012-12-05 ENCOUNTER — Observation Stay (HOSPITAL_COMMUNITY): Payer: Medicare Other | Admitting: Anesthesiology

## 2012-12-05 ENCOUNTER — Encounter (HOSPITAL_COMMUNITY): Payer: Self-pay | Admitting: Anesthesiology

## 2012-12-05 ENCOUNTER — Encounter (HOSPITAL_COMMUNITY): Payer: Medicare Other | Admitting: Anesthesiology

## 2012-12-05 ENCOUNTER — Encounter (HOSPITAL_COMMUNITY)
Admission: EM | Disposition: A | Discharge status: Home / Self Care | Payer: Self-pay | Source: Home / Self Care | Attending: Internal Medicine

## 2012-12-05 DIAGNOSIS — I2510 Atherosclerotic heart disease of native coronary artery without angina pectoris: Secondary | ICD-10-CM | POA: Diagnosis not present

## 2012-12-05 DIAGNOSIS — I359 Nonrheumatic aortic valve disorder, unspecified: Secondary | ICD-10-CM | POA: Diagnosis not present

## 2012-12-05 DIAGNOSIS — I495 Sick sinus syndrome: Secondary | ICD-10-CM | POA: Diagnosis not present

## 2012-12-05 DIAGNOSIS — K8010 Calculus of gallbladder with chronic cholecystitis without obstruction: Secondary | ICD-10-CM

## 2012-12-05 DIAGNOSIS — I739 Peripheral vascular disease, unspecified: Secondary | ICD-10-CM | POA: Diagnosis not present

## 2012-12-05 DIAGNOSIS — K8000 Calculus of gallbladder with acute cholecystitis without obstruction: Secondary | ICD-10-CM | POA: Diagnosis not present

## 2012-12-05 DIAGNOSIS — R079 Chest pain, unspecified: Secondary | ICD-10-CM | POA: Diagnosis not present

## 2012-12-05 DIAGNOSIS — R109 Unspecified abdominal pain: Secondary | ICD-10-CM | POA: Diagnosis not present

## 2012-12-05 DIAGNOSIS — K8020 Calculus of gallbladder without cholecystitis without obstruction: Secondary | ICD-10-CM | POA: Diagnosis not present

## 2012-12-05 DIAGNOSIS — K219 Gastro-esophageal reflux disease without esophagitis: Secondary | ICD-10-CM | POA: Diagnosis not present

## 2012-12-05 DIAGNOSIS — I4891 Unspecified atrial fibrillation: Secondary | ICD-10-CM | POA: Diagnosis not present

## 2012-12-05 DIAGNOSIS — J9819 Other pulmonary collapse: Secondary | ICD-10-CM | POA: Diagnosis not present

## 2012-12-05 HISTORY — PX: CHOLECYSTECTOMY: SHX55

## 2012-12-05 LAB — CBC
HCT: 45.3 % (ref 39.0–52.0)
Hemoglobin: 15.4 g/dL (ref 13.0–17.0)
MCH: 31.7 pg (ref 26.0–34.0)
MCHC: 34 g/dL (ref 30.0–36.0)
MCV: 93.2 fL (ref 78.0–100.0)
Platelets: 115 10*3/uL — ABNORMAL LOW (ref 150–400)
RBC: 4.86 MIL/uL (ref 4.22–5.81)
RDW: 15.2 % (ref 11.5–15.5)
WBC: 16.5 10*3/uL — ABNORMAL HIGH (ref 4.0–10.5)

## 2012-12-05 LAB — COMPREHENSIVE METABOLIC PANEL
ALT: 21 U/L (ref 0–53)
AST: 26 U/L (ref 0–37)
Albumin: 3.1 g/dL — ABNORMAL LOW (ref 3.5–5.2)
Alkaline Phosphatase: 60 U/L (ref 39–117)
BUN: 15 mg/dL (ref 6–23)
CO2: 25 mEq/L (ref 19–32)
Calcium: 8.3 mg/dL — ABNORMAL LOW (ref 8.4–10.5)
Chloride: 99 mEq/L (ref 96–112)
Creatinine, Ser: 0.77 mg/dL (ref 0.50–1.35)
GFR calc Af Amer: >90 mL/min (ref >90)
GFR calc non Af Amer: 81 mL/min — ABNORMAL LOW (ref >90)
Glucose, Bld: 110 mg/dL — ABNORMAL HIGH (ref 70–99)
Potassium: 3.9 mEq/L (ref 3.5–5.1)
Sodium: 134 mEq/L — ABNORMAL LOW (ref 135–145)
Total Bilirubin: 2.5 mg/dL — ABNORMAL HIGH (ref 0.3–1.2)
Total Protein: 6.4 g/dL (ref 6.0–8.3)

## 2012-12-05 LAB — PROTIME-INR
INR: 2.03 — ABNORMAL HIGH (ref 0.00–1.49)
INR: 1.51 — ABNORMAL HIGH (ref 0.00–1.49)
Prothrombin Time: 22.3 seconds — ABNORMAL HIGH (ref 11.6–15.2)
Prothrombin Time: 17.8 seconds — ABNORMAL HIGH (ref 11.6–15.2)

## 2012-12-05 LAB — SURGICAL PCR SCREEN
MRSA, PCR: NEGATIVE
Staphylococcus aureus: NEGATIVE

## 2012-12-05 LAB — HEPARIN LEVEL (UNFRACTIONATED): Heparin Unfractionated: 0.14 IU/mL — ABNORMAL LOW (ref 0.30–0.70)

## 2012-12-05 SURGERY — LAPAROSCOPIC CHOLECYSTECTOMY WITH INTRAOPERATIVE CHOLANGIOGRAM
Anesthesia: General | Site: Abdomen | Wound class: Dirty or Infected

## 2012-12-05 MED ORDER — FENTANYL CITRATE 0.05 MG/ML IJ SOLN: 25.0000 ug | INTRAMUSCULAR | Status: DC | PRN

## 2012-12-05 MED ORDER — SODIUM CHLORIDE 0.9 % IR SOLN
Status: DC | PRN
  Administered 2012-12-05: 1000 mL

## 2012-12-05 MED ORDER — ACETAMINOPHEN 325 MG PO TABS
650.0000 mg | ORAL_TABLET | Freq: Once | ORAL | Status: AC
  Administered 2012-12-05: 650 mg via ORAL
  Filled 2012-12-05: qty 2

## 2012-12-05 MED ORDER — VECURONIUM BROMIDE 10 MG IV SOLR
INTRAVENOUS | Status: DC | PRN
  Administered 2012-12-05: 1 mg via INTRAVENOUS

## 2012-12-05 MED ORDER — HEMOSTATIC AGENTS (NO CHARGE) OPTIME
TOPICAL | Status: DC | PRN
  Administered 2012-12-05: 1 via TOPICAL

## 2012-12-05 MED ORDER — GLYCOPYRROLATE 0.2 MG/ML IJ SOLN
INTRAMUSCULAR | Status: DC | PRN
  Administered 2012-12-05: .6 mg via INTRAVENOUS

## 2012-12-05 MED ORDER — PROPOFOL 10 MG/ML IV BOLUS
INTRAVENOUS | Status: DC | PRN
  Administered 2012-12-05: 130 mg via INTRAVENOUS

## 2012-12-05 MED ORDER — BUPIVACAINE-EPINEPHRINE 0.5% -1:200000 IJ SOLN
INTRAMUSCULAR | Status: DC | PRN
  Administered 2012-12-05: 8 mL

## 2012-12-05 MED ORDER — ALBUMIN HUMAN 5 % IV SOLN
INTRAVENOUS | Status: DC | PRN
  Administered 2012-12-05: 17:00:00 via INTRAVENOUS

## 2012-12-05 MED ORDER — ONDANSETRON HCL 4 MG/2ML IJ SOLN
INTRAMUSCULAR | Status: DC | PRN
  Administered 2012-12-05: 4 mg via INTRAVENOUS

## 2012-12-05 MED ORDER — OXYCODONE-ACETAMINOPHEN 5-325 MG PO TABS
1.0000 | ORAL_TABLET | ORAL | Status: DC | PRN
  Administered 2012-12-05: 1 via ORAL
  Filled 2012-12-05 (×2): qty 1

## 2012-12-05 MED ORDER — BUPIVACAINE-EPINEPHRINE (PF) 0.5% -1:200000 IJ SOLN
INTRAMUSCULAR | Status: AC
  Filled 2012-12-05: qty 10

## 2012-12-05 MED ORDER — NEOSTIGMINE METHYLSULFATE 1 MG/ML IJ SOLN
INTRAMUSCULAR | Status: DC | PRN
  Administered 2012-12-05: 5 mg via INTRAVENOUS

## 2012-12-05 MED ORDER — SODIUM CHLORIDE 0.9 % IV SOLN
INTRAVENOUS | Status: DC | PRN
  Administered 2012-12-05: 17:00:00

## 2012-12-05 MED ORDER — FENTANYL CITRATE 0.05 MG/ML IJ SOLN
INTRAMUSCULAR | Status: DC | PRN
  Administered 2012-12-05: 100 ug via INTRAVENOUS
  Administered 2012-12-05: 150 ug via INTRAVENOUS
  Administered 2012-12-05: 50 ug via INTRAVENOUS

## 2012-12-05 MED ORDER — LACTATED RINGERS IV SOLN
INTRAVENOUS | Status: DC | PRN
  Administered 2012-12-05 (×2): via INTRAVENOUS

## 2012-12-05 MED ORDER — VITAMIN K1 10 MG/ML IJ SOLN
10.0000 mg | Freq: Once | INTRAMUSCULAR | Status: AC
  Administered 2012-12-05: 10 mg via SUBCUTANEOUS
  Filled 2012-12-05: qty 1

## 2012-12-05 MED ORDER — ROCURONIUM BROMIDE 100 MG/10ML IV SOLN
INTRAVENOUS | Status: DC | PRN
  Administered 2012-12-05: 10 mg via INTRAVENOUS
  Administered 2012-12-05: 40 mg via INTRAVENOUS

## 2012-12-05 MED ORDER — SODIUM CHLORIDE 0.9 % IJ SOLN
INTRAMUSCULAR | Status: AC
  Filled 2012-12-05: qty 50

## 2012-12-05 MED ORDER — MENTHOL 3 MG MT LOZG
1.0000 | LOZENGE | OROMUCOSAL | Status: DC | PRN
  Filled 2012-12-05: qty 9

## 2012-12-05 MED ORDER — PHENYLEPHRINE HCL 10 MG/ML IJ SOLN
INTRAMUSCULAR | Status: DC | PRN
  Administered 2012-12-05 (×3): 80 ug via INTRAVENOUS

## 2012-12-05 SURGICAL SUPPLY — 46 items
ADH SKN CLS APL DERMABOND .7 (GAUZE/BANDAGES/DRESSINGS) ×1
APPLIER CLIP ROT 10 11.4 M/L (STAPLE) ×2
APR CLP MED LRG 11.4X10 (STAPLE) ×1
BAG SPEC RTRVL LRG 6X4 10 (ENDOMECHANICALS) ×1
BLADE SURG ROTATE 9660 (MISCELLANEOUS) IMPLANT
CANISTER SUCTION 2500CC (MISCELLANEOUS) ×2 IMPLANT
CHLORAPREP W/TINT 26ML (MISCELLANEOUS) ×2 IMPLANT
CLIP APPLIE ROT 10 11.4 M/L (STAPLE) ×1 IMPLANT
COVER MAYO STAND STRL (DRAPES) ×2 IMPLANT
COVER SURGICAL LIGHT HANDLE (MISCELLANEOUS) ×2 IMPLANT
DECANTER SPIKE VIAL GLASS SM (MISCELLANEOUS) ×2 IMPLANT
DERMABOND ADVANCED (GAUZE/BANDAGES/DRESSINGS) ×1
DERMABOND ADVANCED .7 DNX12 (GAUZE/BANDAGES/DRESSINGS) ×1 IMPLANT
DRAIN CHANNEL 19F RND (DRAIN) ×1 IMPLANT
DRAPE C-ARM 42X72 X-RAY (DRAPES) ×2 IMPLANT
DRAPE UTILITY 15X26 W/TAPE STR (DRAPE) ×4 IMPLANT
ELECT REM PT RETURN 9FT ADLT (ELECTROSURGICAL) ×2
ELECTRODE REM PT RTRN 9FT ADLT (ELECTROSURGICAL) ×1 IMPLANT
EVACUATOR SILICONE 100CC (DRAIN) ×1 IMPLANT
GLOVE BIOGEL PI IND STRL 8 (GLOVE) ×1 IMPLANT
GLOVE BIOGEL PI INDICATOR 8 (GLOVE) ×2
GLOVE SS BIOGEL STRL SZ 7.5 (GLOVE) ×1 IMPLANT
GLOVE SUPERSENSE BIOGEL SZ 7.5 (GLOVE) ×1
GOWN STRL NON-REIN LRG LVL3 (GOWN DISPOSABLE) ×6 IMPLANT
GOWN STRL REIN XL XLG (GOWN DISPOSABLE) ×2 IMPLANT
HEMOSTAT SURGICEL 2X14 (HEMOSTASIS) ×1 IMPLANT
KIT BASIN OR (CUSTOM PROCEDURE TRAY) ×2 IMPLANT
KIT ROOM TURNOVER OR (KITS) ×2 IMPLANT
NS IRRIG 1000ML POUR BTL (IV SOLUTION) ×2 IMPLANT
PAD ARMBOARD 7.5X6 YLW CONV (MISCELLANEOUS) ×2 IMPLANT
POUCH SPECIMEN RETRIEVAL 10MM (ENDOMECHANICALS) ×1 IMPLANT
SCISSORS LAP 5X35 DISP (ENDOMECHANICALS) ×1 IMPLANT
SET CHOLANGIOGRAPH 5 50 .035 (SET/KITS/TRAYS/PACK) ×2 IMPLANT
SET IRRIG TUBING LAPAROSCOPIC (IRRIGATION / IRRIGATOR) ×2 IMPLANT
SLEEVE ENDOPATH XCEL 5M (ENDOMECHANICALS) ×2 IMPLANT
SPECIMEN JAR SMALL (MISCELLANEOUS) ×2 IMPLANT
SPONGE GAUZE 4X4 12PLY (GAUZE/BANDAGES/DRESSINGS) ×1 IMPLANT
SUT ETHILON 3 0 FSL (SUTURE) ×1 IMPLANT
SUT MON AB 5-0 PS2 18 (SUTURE) ×2 IMPLANT
TAPE CLOTH SURG 4X10 WHT LF (GAUZE/BANDAGES/DRESSINGS) ×1 IMPLANT
TOWEL OR 17X24 6PK STRL BLUE (TOWEL DISPOSABLE) ×2 IMPLANT
TOWEL OR 17X26 10 PK STRL BLUE (TOWEL DISPOSABLE) ×2 IMPLANT
TRAY LAPAROSCOPIC (CUSTOM PROCEDURE TRAY) ×2 IMPLANT
TROCAR XCEL BLUNT TIP 100MML (ENDOMECHANICALS) ×2 IMPLANT
TROCAR XCEL NON-BLD 11X100MML (ENDOMECHANICALS) ×3 IMPLANT
TROCAR XCEL NON-BLD 5MMX100MML (ENDOMECHANICALS) ×2 IMPLANT

## 2012-12-05 NOTE — Progress Notes (Signed)
Came to see patient but he is in the operating room having a cholecystectomy. I will see him in the morning.   Tonny Bollman 12/05/2012 6:21 PM

## 2012-12-05 NOTE — Anesthesia Preprocedure Evaluation (Signed)
Anesthesia Evaluation   Patient awake    Reviewed: H&P , NPO status , Patient's Chart, lab work & pertinent test results  Airway Mallampati: II      Dental   Pulmonary  breath sounds clear to auscultation        Cardiovascular hypertension, + CAD and + Peripheral Vascular Disease + dysrhythmias     Neuro/Psych    GI/Hepatic Neg liver ROS, GERD-  ,  Endo/Other  negative endocrine ROS  Renal/GU negative Renal ROS     Musculoskeletal   Abdominal   Peds  Hematology   Anesthesia Other Findings   Reproductive/Obstetrics                           Anesthesia Physical Anesthesia Plan  ASA: III  Anesthesia Plan: General   Post-op Pain Management:    Induction: Intravenous  Airway Management Planned: Oral ETT  Additional Equipment:   Intra-op Plan:   Post-operative Plan: Possible Post-op intubation/ventilation  Informed Consent: I have reviewed the patients History and Physical, chart, labs and discussed the procedure including the risks, benefits and alternatives for the proposed anesthesia with the patient or authorized representative who has indicated his/her understanding and acceptance.   Dental advisory given  Plan Discussed with: CRNA, Anesthesiologist and Surgeon  Anesthesia Plan Comments:         Anesthesia Quick Evaluation

## 2012-12-05 NOTE — Progress Notes (Signed)
ANTICOAGULATION CONSULT NOTE - Follow Up Consult  Pharmacy Consult for Heparin  Indication: atrial fibrillation  No Known Allergies  Patient Measurements: Height: 5\' 9"  (175.3 cm) Weight: 191 lb 4.8 oz (86.773 kg) IBW/kg (Calculated) : 70.7  Vital Signs: Temp: 97.7 F (36.5 C) (10/23 0446) Temp src: Oral (10/22 1819) BP: 161/91 mmHg (10/23 0446) Pulse Rate: 88 (10/23 0446)  Labs:  Recent Labs  12/03/12 1803 12/03/12 2026 12/04/12 0118 12/04/12 0538 12/04/12 1049 12/05/12 0220  HGB 16.3  --   --   --   --  15.4  HCT 46.4  --   --   --   --  45.3  PLT 142*  --   --   --   --  115*  LABPROT  --  22.9*  --  20.7*  --  22.3*  INR  --  2.10*  --  1.84*  --  2.03*  HEPARINUNFRC  --   --   --   --   --  0.14*  CREATININE 0.79  --   --   --   --  0.77  TROPONINI  --   --  <0.30 <0.30 <0.30  --     Estimated Creatinine Clearance: 75 ml/min (by C-G formula based on Cr of 0.77).   Medications:  -Heparin at 1200 units/hr  Assessment: 79 y/o M on heparin while warfarin is on hold for possible procedure. INR is 2.03<1.84 this AM, HL is 0.14 on 1200 units/hr of heparin. Hgb 15.4, renal function stable, no overt bleeding noted.   Goal of Therapy:  Heparin level 0.3-0.7 units/ml Monitor platelets by anticoagulation protocol: Yes   Plan:  -Increase heparin drip to 1450 units/hr -8 hour HL at 1400 -Daily CBC/HL -Monitor for bleeding -F/U procedure plans  Thank you for allowing me to take part in this patient's care,  Abran Duke, PharmD Clinical Pharmacist Phone: [phone] Pager: [phone] 12/05/2012 5:04 AM

## 2012-12-05 NOTE — Transfer of Care (Signed)
Immediate Anesthesia Transfer of Care Note  Patient: Tyler Williams  Procedure(s) Performed: Procedure(s): LAPAROSCOPIC CHOLECYSTECTOMY  (N/A)  Patient Location: PACU  Anesthesia Type:General  Level of Consciousness: awake, alert  and patient cooperative  Airway & Oxygen Therapy: Patient Spontanous Breathing and Patient connected to face mask oxygen  Post-op Assessment: Report given to PACU RN, Post -op Vital signs reviewed and stable, Patient moving all extremities and Patient moving all extremities X 4  Post vital signs: Reviewed and stable  Complications: No apparent anesthesia complications

## 2012-12-05 NOTE — Anesthesia Postprocedure Evaluation (Signed)
Anesthesia Post Note  Patient: Tyler Williams  Procedure(s) Performed: Procedure(s) (LRB): LAPAROSCOPIC CHOLECYSTECTOMY  (N/A)  Anesthesia type: General  Patient location: PACU  Post pain: Pain level controlled and Adequate analgesia  Post assessment: Post-op Vital signs reviewed, Patient's Cardiovascular Status Stable, Respiratory Function Stable, Patent Airway and Pain level controlled  Last Vitals:  Filed Vitals:   12/05/12 1900  BP:   Pulse: 75  Temp:   Resp: 19    Post vital signs: Reviewed and stable  Level of consciousness: awake, alert  and oriented  Complications: No apparent anesthesia complications

## 2012-12-05 NOTE — Progress Notes (Signed)
Patient Name: Tyler Williams Date of Encounter: 12/05/2012  Principal Problem:   Chest pain, atypical Active Problems:   HYPERCHOLESTEROLEMIA  IIA   CAD, NATIVE VESSEL   Atrial fibrillation   Aortic stenosis   Overweight (BMI 25.0-29.9)   Hematuria, microscopic    SUBJECTIVE: Patient is a 79 year old male with a history of CAD, PAF, PVD, SSS, HTN, GERD, PE, skin cancer, and lower extremity edema who presented to the ED with RLQ abdominal pain and crushing chest pain that was not responsive to 2SLNTG.   He is lying in bed, and keeps his eyes closed during the interview. He is A&O x 3, but reports that he is "tired, hungry, and disgusted with everyone," and states that he would like to leave. He endorses some RLQ abdominal pain, which is very tender to palpation.   He denies any chest pain, neck pain, or arm pain. He denies any heart palpations, but states that he knows he has A-fib, but is not able to tell when it is happening. He also denies any fevers, dizziness, or changes in bowel or bladder habits. He has been walking the hallways a lot this morning, and denies any SOB upon exertion or rest.   OBJECTIVE Filed Vitals:   12/04/12 1819 12/04/12 1942 12/04/12 2215 12/05/12 0446  BP:  189/83 172/79 161/91  Pulse:  82  88  Temp: 99.3 F (37.4 C) 98.3 F (36.8 C)  97.7 F (36.5 C)  TempSrc: Oral     Resp:  18  18  Height:      Weight:      SpO2:  95%  96%    Intake/Output Summary (Last 24 hours) at 12/05/12 0817 Last data filed at 12/04/12 1500  Gross per 24 hour  Intake    606 ml  Output      0 ml  Net    606 ml   Filed Weights   12/03/12 2352  Weight: 191 lb 4.8 oz (86.773 kg)    PHYSICAL EXAM General: Well developed, well nourished, male in no acute distress. Head: Normocephalic, atraumatic.  Neck: Supple, faint bruit in left carotid, No JVD. Lungs:  Resp regular and unlabored, some diminished lung sounds/crackles in RLL. Heart: irregularly irregular  rate, S1, S2, no S3, S4, 3/6 systolic murmur, with some radiation to the left carotid; no rub. Abdomen: Soft, very tender, slightly distended, BS + x 4.  Extremities: No clubbing, cyanosis, no edema.  Neuro: Alert and oriented X 3. Moves all extremities spontaneously. Psych: Normal affect.  LABS: CBC: Recent Labs  12/03/12 1803 12/05/12 0220  WBC 9.8 16.5*  HGB 16.3 15.4  HCT 46.4 45.3  MCV 93.9 93.2  PLT 142* 115*   INR: Recent Labs  12/05/12 0220  INR 2.03*   Basic Metabolic Panel: Recent Labs  12/03/12 1803 12/05/12 0220  NA 138 134*  K 4.2 3.9  CL 102 99  CO2 26 25  GLUCOSE 120* 110*  BUN 18 15  CREATININE 0.79 0.77  CALCIUM 9.0 8.3*   Liver Function Tests: Recent Labs  12/04/12 1528 12/05/12 0220  AST 27 26  ALT 25 21  ALKPHOS 64 60  BILITOT 2.1* 2.5*  PROT 6.8 6.4  ALBUMIN 3.3* 3.1*   Cardiac Enzymes: Recent Labs  12/04/12 0118 12/04/12 0538 12/04/12 1049  TROPONINI <0.30 <0.30 <0.30    Recent Labs  12/03/12 1830  TROPIPOC 0.02   Fasting Lipid Panel:  Lab Results  Component Value Date  CHOL 112 05/23/2012   HDL 49 05/23/2012   LDLCALC 52 05/23/2012   TRIG 56 05/23/2012   CHOLHDL 2.3 05/23/2012   Urinalysis    Component Value Date/Time   COLORURINE YELLOW 12/03/2012 1846   APPEARANCEUR HAZY* 12/03/2012 1846   LABSPEC 1.019 12/03/2012 1846   PHURINE 5.0 12/03/2012 1846   GLUCOSEU NEGATIVE 12/03/2012 1846   HGBUR MODERATE* 12/03/2012 1846   BILIRUBINUR NEGATIVE 12/03/2012 1846   KETONESUR NEGATIVE 12/03/2012 1846   PROTEINUR 30* 12/03/2012 1846   UROBILINOGEN 0.2 12/03/2012 1846   NITRITE NEGATIVE 12/03/2012 1846   LEUKOCYTESUR NEGATIVE 12/03/2012 1846    TELE: Atrial fibrillation, with intermittent multiform and paired PVCs.       ECG: atrial fibrillation, 77 bpm, RBBB, downsloping ST depressions III, aVF, V3-V6 (unchanged from prior tracings)  Radiology/Studies: Dg Chest 2 View  12/03/2012   CLINICAL DATA:  Chest pain  and back pain  EXAM: CHEST  2 VIEW  COMPARISON:  05/22/2012  FINDINGS: Negative for heart failure or pneumonia. Negative for infiltrate or effusion or mass lesion. Overall improved aeration from the prior study.  IMPRESSION: No active cardiopulmonary disease.   Electronically Signed   By: Marlan Palau M.D.   On: 12/03/2012 18:38   Ct Angio Abdomen W/cm &/or Wo Contrast  12/03/2012   CLINICAL DATA:  Rule out dissection. Mid chest pain.  EXAM: CT ANGIOGRAPHY CHEST within without AND ABDOMEN with  TECHNIQUE: Initial noncontrast CT the chest. Multidetector CT imaging of the chest and abdomen was performed using the standard protocol during bolus administration of intravenous contrast. Multiplanar CT image reconstructions including MIPs were obtained to evaluate the vascular anatomy.  CONTRAST:  OMNIPAQUE IOHEXOL 350 MG/ML SOLN  COMPARISON:  None.  FINDINGS: CTA CHEST FINDINGS  Non IV contrast images demonstrate no intramural hematoma within the thoracic aorta. Contrast enhanced imaging demonstrates no dissection or aneurysm of the ascending thoracic aorta, transverse thoracic aorta, or descending thoracic aorta. There is moderate intimal calcification. The great vessels are normal.  There is no central pulmonary embolism present. No pericardial fluid. Coronary artery calcifications are noted in all 3 coronary arteries.  No axillary supraclavicular lymphadenopathy. No mediastinal hilar lymphadenopathy. Esophagus is normal.  Review of the lung parenchyma demonstrates nodular atelectasis at the right lung base measuring 15 x 19 mm (image 92). There is a thin-walled cyst at the right lung base additionally.  Review of the MIP images confirms the above findings.  CTA ABDOMEN FINDINGS  No evidence of dissection or aneurysm of the abdominal aorta. The SMA and celiac trunk are patent. There is a stenosis of the superior mesenteric artery with heavy circumferential intimal calcification (Image 103 of series and 149,  series 5). There is ostial calcifications of the left and right renal arteries. There is bi-iliac bypass graft anatomy which is incompletely image. The IMA is diminutive in likely sacrificed and back filling.  No focal hepatic lesion. There are multiple gallstones layering within the gallbladder lumen. The pancreas, spleen, adrenal glands are normal. There is bilateral nonenhancing renal cysts  The stomach limited view of the small bowel and colon are unremarkable. Limited view of the appendix is normal. No retroperitoneal lymphadenopathy.  Review of the MIP images confirms the above findings.  IMPRESSION: 1. No evidence of aortic dissection or aneurysm of the thoracic aorta or abdominal aorta.  2. No evidence of pulmonary embolism.  3.  Extensive coronary artery calcifications noted.  4. Nodular atelectasis at the right lung base. Recommend followup CT  in 3 months.  5. 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 Extensive atherosclerotic disease of the renal arteries.  6. Cholelithiasis without cholecystitis.   Electronically Signed   By: Genevive Bi M.D.   On: 12/03/2012 20:30   Ct Angio Chest Aortic Dissect W &/or W/o  12/03/2012   CLINICAL DATA:  Rule out dissection. Mid chest pain.  EXAM: CT ANGIOGRAPHY CHEST within without AND ABDOMEN with  TECHNIQUE: Initial noncontrast CT the chest. Multidetector CT imaging of the chest and abdomen was performed using the standard protocol during bolus administration of intravenous contrast. Multiplanar CT image reconstructions including MIPs were obtained to evaluate the vascular anatomy.  CONTRAST:  OMNIPAQUE IOHEXOL 350 MG/ML SOLN  COMPARISON:  None.  FINDINGS: CTA CHEST FINDINGS  Non IV contrast images demonstrate no intramural hematoma within the thoracic aorta. Contrast enhanced imaging demonstrates no dissection or aneurysm of the ascending thoracic aorta, transverse thoracic aorta, or descending thoracic aorta. There is moderate intimal  calcification. The great vessels are normal.  There is no central pulmonary embolism present. No pericardial fluid. Coronary artery calcifications are noted in all 3 coronary arteries.  No axillary supraclavicular lymphadenopathy. No mediastinal hilar lymphadenopathy. Esophagus is normal.  Review of the lung parenchyma demonstrates nodular atelectasis at the right lung base measuring 15 x 19 mm (image 92). There is a thin-walled cyst at the right lung base additionally.  Review of the MIP images confirms the above findings.  CTA ABDOMEN FINDINGS  No evidence of dissection or aneurysm of the abdominal aorta. The SMA and celiac trunk are patent. 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 There is ostial calcifications of the left and right renal arteries. There is bi-iliac bypass graft anatomy which is incompletely image. The IMA is diminutive in likely sacrificed and back filling.  No focal hepatic lesion. There are multiple gallstones layering within the gallbladder lumen. The pancreas, spleen, adrenal glands are normal. There is bilateral nonenhancing renal cysts  The stomach limited view of the small bowel and colon are unremarkable. Limited view of the appendix is normal. No retroperitoneal lymphadenopathy.  Review of the MIP images confirms the above findings.  IMPRESSION: 1. No evidence of aortic dissection or aneurysm of the thoracic aorta or abdominal aorta.  2. No evidence of pulmonary embolism.  3.  Extensive coronary artery calcifications noted.  4. Nodular atelectasis at the right lung base. Recommend followup CT in 3 months.  5. 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 Extensive atherosclerotic disease of the renal arteries.  6. Cholelithiasis without cholecystitis.   Electronically Signed   By: Genevive Bi M.D.   On: 12/03/2012 20:30     Current Medications:  . atorvastatin  20 mg Oral  QPM  . cholecalciferol  5,000 Units Oral Daily  . ciprofloxacin  400 mg Intravenous On Call to OR  . docusate sodium  200 mg Oral BID  . ezetimibe  10 mg Oral QPM  . hydrochlorothiazide  12.5 mg Oral QAC breakfast  . isosorbide mononitrate  60 mg Oral Daily  . metoprolol succinate  50 mg Oral BID  . multivitamin with minerals  1 tablet Oral Daily  . omega-3 acid ethyl esters  1 g Oral Daily  . psyllium  1 packet Oral Daily  . ramipril  10 mg Oral BID  . sodium chloride  3 mL Intravenous Q12H  . sodium chloride  3 mL  Intravenous Q12H   . heparin 1,450 Units/hr (12/05/12 0517)    ASSESSMENT AND PLAN: Patient is an 79 year old male with a significant cardiac history positive for CAD, HTN, PAF, SSS, and PVD, who presented to the ED on 12/03/12 with crushing chest pain that was non-responsive to 2SLNTG.  Principal Problem: 1. Chest pain, atypical: Tnl WNL x 3. EKG reveals atrial fibrillation, RBBB, downsloping ST depressions III, aVF, V3-V6 (unchanged from prior tracings). CT-A did not indicate any acute process. Patient's chest pain has resolved, although he continues to have RLQ abdominal pain. Patient has noticed no changes in activity level, or DOE. Patient's pain does not seem to be cardiac in origin. No further work-up is need while inpatient, but patient will continue to address his cardiac issues on an outpatient basis.   Active Problems:  2.  HYPERCHOLESTEROLEMIA  IIA:  Continue home medications of atorvastatin 20mg  daily, and ezetimibe 10 mg daily. F/U as OP.   3. CAD, NATIVE VESSEL:Has a high-grade non-revascularizable RCA lesion. Moderate risk for cholecystectomy. Will continue to monitor. Patient remains asymptomatic and activity level is preserved.    4. Chronic Atrial fibrillation: Rate is controlled on current therapy.   5. Anticoagulation: PTA Coumadin is on hold, currently on Heparin. Continue established Coumadin regimen after surgery, and monitor for signs of PE, or  DVT.   6. Aortic stenosis: follow   7. Overweight (BMI 25.0-29.9) - follow, per IM   8. Hematuria, microscopic - per IM  Signed, Kaylor,Serena I PA-S  Seen and agree Theodore Demark , PA-C 8:17 AM 12/05/2012

## 2012-12-05 NOTE — Op Note (Signed)
Preoperative Diagnosis: cholelithiasis and acute cholecystitis  Postoprative Diagnosis: cholelithiasis and acute gangrenous cholecystitis Procedure: Procedure(s): LAPAROSCOPIC CHOLECYSTECTOMY    Surgeon: Glenna Fellows T   Assistants: Violeta Gelinas M.D.  Anesthesia:  General endotracheal anesthesia  Indications: patient is an 79 year old male who presented 2 days ago with acute severe generalized abdominal pain radiating to the chest. He has had a cardiac event ruled out and gallbladder ultrasound has shown gallstones and some thickening of the gallbladder wall. He does have some epigastric and right upper quadrant tenderness. He is felt to have acute cholecystitis we have recommended proceeding with laparoscopic cholecystectomy with cholangiogram. He has been chronically anticoagulated which was reversed preoperatively.I discussed the procedure in detail.    We discussed the risks and benefits of a laparoscopic cholecystectomy and possible cholangiogram including, but not limited to bleeding, infection, injury to surrounding structures such as the intestine or liver, bile leak, retained gallstones, need to convert to an open procedure, prolonged diarrhea, blood clots such as  DVT, common bile duct injury, anesthesia risks, and possible need for additional procedures.  The likelihood of improvement in symptoms and return to the patient's normal status is good. We discussed the typical post-operative recovery course.    Procedure Detail:  Patient was brought to the operating room, placed in the supine position on the operating table, and general endotracheal anesthesia induced. He was already on broad-spectrum IV antibiotics. The abdomen was widely sterilely prepped and draped. PAS port placed. Patient time out was performed and the procedure verified. Due to previous midline incision and hernia repair access was obtained in the right upper quadrant with a 5 mm Optiview trocar without difficulty  and pneumoperitoneum established. There were numerous omental adhesions in the midline. Under direct vision a second 5 mm trocar was placed more laterally in the right abdomen. Omental adhesions were taken down from the midline using blunt and cautery dissection and there were no bowel adhesions. The midline was cleared down to the umbilicus and up to the epigastrium. 11 mm trochars were placed just above the umbilicus and in the epigastrium. The gallbladder was exposed and was severely and acutely inflamed and distended with patchy gangrenous changes. It was decompressed with an aspiration needle to allow it to be grasped. The fundus was elevated and the infundibulum retracted inferolaterally. The fibrofatty tissue was stripped off the neck of the gallbladder toward the porta hepatis. Dissection was difficult due to the severe acute inflammation. I stayed on the gallbladder wall and dissecting down toward the porta hepatis we were able to clearly identify the cystic artery anteriorly coursing up to the gallbladder wall which was divided between 2 proximal and one distal clip. Staying on the gallbladder wall we dissected distally until the gallbladder clearly tapered down to the cystic duct. We were able to dissect around the cystic duct gallbladder junction 360. Due to the poor condition of the tissue I elected not to perform a cholangiogram as the patient's LFTs were essentially normal. The cystic duct was doubly clipped proximally, clipped distally and divided. The gallbladder was then dissected free from its bed using hook cautery. There was some bleeding from the liver bed that was controlled with cautery and Surgicel pack placed. The gallbladder was placed in an Endo Catch bag and brought up to the periumbilical incision and was opened and decompressed and removed. This incision had to be enlarged slightly to remove the gallbladder and the fascia was closed with a figure-of-eight suture of 0 Vicryl. The  abdomen  was thoroughly irrigated and complete hemostasis assured. There was no evidence of bleeding or trocar injury. A closed suction drain was brought out through a right lateral stab wound and left in Morison's pouch. The skin incisions were closed with subcuticular Monocryl and Dermabond. Sponge needle and instrument counts were correct.    Findings: Severe acute gangrenous cholecystitis  Estimated Blood Loss:  less than 100 mL         Drains: 19 Blake drain in right upper quadrant  Blood Given: none          Specimens: gallbladder and contents        Complications:  * No complications entered in OR log *         Disposition: PACU - hemodynamically stable.         Condition: stable

## 2012-12-05 NOTE — Progress Notes (Signed)
Patient ID: Tyler Williams  male  ZOX:[id number]    DOB: 02-10-1927    DOA: 12/03/2012  PCP: Minda Meo, MD  Assessment/Plan: Principal Problem:   Chest pain, atypical with known history of coronary artery disease, cardiac cath in for 2014, had a 99% mid RCA lesion, unsuccessful PCI, unable to use Rotablator.  - Ruled out acute ACS however given high risk patient cardiology was consulted. - Per Dr. Gala Romney, no further cardiac workup is needed at this time, cardiac enzymes are negative and no acute EKG changes, symptoms likely not anginal.   Right upper quadrant Abdominal pain: Likely due to a known history of cholelithiasis - CT abdomen pelvis had shown cholelithiasis, had right upper quadrant  - N.p.o. for surgery today, INR still high, giving 2 units of FFP and vitamin K, will recheck INR after the FFP    Atrial fibrillation - Currently rate controlled, Coumadin on hold, heparin drip stopped today for surgery.     Aortic stenosis - Follow outpatient with cardiology,  DVT Prophylaxis:  Code Status:  Disposition: Possible tomorrow if okay with surgery, laparoscopic cholecystectomy today    Subjective: Still has right upper quadrant tenderness, constipation resolved, awaiting surgery today  Objective: Weight change:   Intake/Output Summary (Last 24 hours) at 12/05/12 1512 Last data filed at 12/05/12 1320  Gross per 24 hour  Intake    217 ml  Output      0 ml  Net    217 ml   Blood pressure 126/76, pulse 80, temperature 98.3 F (36.8 C), temperature source Oral, resp. rate 20, height 5\' 9"  (1.753 m), weight 86.773 kg (191 lb 4.8 oz), SpO2 100.00%.  Physical Exam: General: Alert and awake, oriented x3, NAD CVS: S1-S2 clear, 2/6 SEM at RUSB Chest: clear to auscultation bilaterally, no wheezing, rales or rhonchi Abdomen: soft mild tenderness in the RUQ, nondistended, normal bowel sounds  Extremities: no cyanosis, clubbing or edema noted bilaterally Neuro: Cranial  nerves II-XII intact, no FND  Lab Results: Basic Metabolic Panel:  Recent Labs Lab 12/03/12 1803 12/05/12 0220  NA 138 134*  K 4.2 3.9  CL 102 99  CO2 26 25  GLUCOSE 120* 110*  BUN 18 15  CREATININE 0.79 0.77  CALCIUM 9.0 8.3*   Liver Function Tests:  Recent Labs Lab 12/04/12 1528 12/05/12 0220  AST 27 26  ALT 25 21  ALKPHOS 64 60  BILITOT 2.1* 2.5*  PROT 6.8 6.4  ALBUMIN 3.3* 3.1*   No results found for this basename: LIPASE, AMYLASE,  in the last 168 hours No results found for this basename: AMMONIA,  in the last 168 hours CBC:  Recent Labs Lab 12/03/12 1803 12/05/12 0220  WBC 9.8 16.5*  HGB 16.3 15.4  HCT 46.4 45.3  MCV 93.9 93.2  PLT 142* 115*   Cardiac Enzymes:  Recent Labs Lab 12/04/12 0118 12/04/12 0538 12/04/12 1049  TROPONINI <0.30 <0.30 <0.30   BNP: No components found with this basename: POCBNP,  CBG: No results found for this basename: GLUCAP,  in the last 168 hours   Micro Results: Recent Results (from the past 240 hour(s))  SURGICAL PCR SCREEN     Status: None   Collection Time    12/05/12 12:54 PM      Result Value Range Status   MRSA, PCR NEGATIVE  NEGATIVE Final   Staphylococcus aureus NEGATIVE  NEGATIVE Final   Comment:            The Xpert SA  Assay (FDA     approved for NASAL specimens     in patients over 77 years of age),     is one component of     a comprehensive surveillance     program.  Test performance has     been validated by [employer] for patients greater     than or equal to 63 year old.     It is not intended     to diagnose infection nor to     guide or monitor treatment.    Studies/Results: Dg Chest 2 View  12/03/2012   CLINICAL DATA:  Chest pain and back pain  EXAM: CHEST  2 VIEW  COMPARISON:  05/22/2012  FINDINGS: Negative for heart failure or pneumonia. Negative for infiltrate or effusion or mass lesion. Overall improved aeration from the prior study.  IMPRESSION: No active cardiopulmonary  disease.   Electronically Signed   By: Marlan Palau M.D.   On: 12/03/2012 18:38   Ct Angio Abdomen W/cm &/or Wo Contrast  12/03/2012   CLINICAL DATA:  Rule out dissection. Mid chest pain.  EXAM: CT ANGIOGRAPHY CHEST within without AND ABDOMEN with  TECHNIQUE: Initial noncontrast CT the chest. Multidetector CT imaging of the chest and abdomen was performed using the standard protocol during bolus administration of intravenous contrast. Multiplanar CT image reconstructions including MIPs were obtained to evaluate the vascular anatomy.  CONTRAST:  OMNIPAQUE IOHEXOL 350 MG/ML SOLN  COMPARISON:  None.  FINDINGS: CTA CHEST FINDINGS  Non IV contrast images demonstrate no intramural hematoma within the thoracic aorta. Contrast enhanced imaging demonstrates no dissection or aneurysm of the ascending thoracic aorta, transverse thoracic aorta, or descending thoracic aorta. There is moderate intimal calcification. The great vessels are normal.  There is no central pulmonary embolism present. No pericardial fluid. Coronary artery calcifications are noted in all 3 coronary arteries.  No axillary supraclavicular lymphadenopathy. No mediastinal hilar lymphadenopathy. Esophagus is normal.  Review of the lung parenchyma demonstrates nodular atelectasis at the right lung base measuring 15 x 19 mm (image 92). There is a thin-walled cyst at the right lung base additionally.  Review of the MIP images confirms the above findings.  CTA ABDOMEN FINDINGS  No evidence of dissection or aneurysm of the abdominal aorta. The SMA and celiac trunk are patent. 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 There is ostial calcifications of the left and right renal arteries. There is bi-iliac bypass graft anatomy which is incompletely image. The IMA is diminutive in likely sacrificed and back filling.  No focal hepatic lesion. There are  multiple gallstones layering within the gallbladder lumen. The pancreas, spleen, adrenal glands are normal. There is bilateral nonenhancing renal cysts  The stomach limited view of the small bowel and colon are unremarkable. Limited view of the appendix is normal. No retroperitoneal lymphadenopathy.  Review of the MIP images confirms the above findings.  IMPRESSION: 1. No evidence of aortic dissection or aneurysm of the thoracic aorta or abdominal aorta.  2. No evidence of pulmonary embolism.  3.  Extensive coronary artery calcifications noted.  4. Nodular atelectasis at the right lung base. Recommend followup CT in 3 months.  5. 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 Extensive atherosclerotic disease of the renal arteries.  6. Cholelithiasis without cholecystitis.   Electronically Signed   By: Genevive Bi M.D.   On: 12/03/2012  20:30   Ct Angio Chest Aortic Dissect W &/or W/o  12/03/2012   CLINICAL DATA:  Rule out dissection. Mid chest pain.  EXAM: CT ANGIOGRAPHY CHEST within without AND ABDOMEN with  TECHNIQUE: Initial noncontrast CT the chest. Multidetector CT imaging of the chest and abdomen was performed using the standard protocol during bolus administration of intravenous contrast. Multiplanar CT image reconstructions including MIPs were obtained to evaluate the vascular anatomy.  CONTRAST:  OMNIPAQUE IOHEXOL 350 MG/ML SOLN  COMPARISON:  None.  FINDINGS: CTA CHEST FINDINGS  Non IV contrast images demonstrate no intramural hematoma within the thoracic aorta. Contrast enhanced imaging demonstrates no dissection or aneurysm of the ascending thoracic aorta, transverse thoracic aorta, or descending thoracic aorta. There is moderate intimal calcification. The great vessels are normal.  There is no central pulmonary embolism present. No pericardial fluid. Coronary artery calcifications are noted in all 3 coronary arteries.  No axillary supraclavicular lymphadenopathy. No mediastinal hilar  lymphadenopathy. Esophagus is normal.  Review of the lung parenchyma demonstrates nodular atelectasis at the right lung base measuring 15 x 19 mm (image 92). There is a thin-walled cyst at the right lung base additionally.  Review of the MIP images confirms the above findings.  CTA ABDOMEN FINDINGS  No evidence of dissection or aneurysm of the abdominal aorta. The SMA and celiac trunk are patent. 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 There is ostial calcifications of the left and right renal arteries. There is bi-iliac bypass graft anatomy which is incompletely image. The IMA is diminutive in likely sacrificed and back filling.  No focal hepatic lesion. There are multiple gallstones layering within the gallbladder lumen. The pancreas, spleen, adrenal glands are normal. There is bilateral nonenhancing renal cysts  The stomach limited view of the small bowel and colon are unremarkable. Limited view of the appendix is normal. No retroperitoneal lymphadenopathy.  Review of the MIP images confirms the above findings.  IMPRESSION: 1. No evidence of aortic dissection or aneurysm of the thoracic aorta or abdominal aorta.  2. No evidence of pulmonary embolism.  3.  Extensive coronary artery calcifications noted.  4. Nodular atelectasis at the right lung base. Recommend followup CT in 3 months.  5. 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 Extensive atherosclerotic disease of the renal arteries.  6. Cholelithiasis without cholecystitis.   Electronically Signed   By: Genevive Bi M.D.   On: 12/03/2012 20:30    Medications: Scheduled Meds: . atorvastatin  20 mg Oral QPM  . cholecalciferol  5,000 Units Oral Daily  . ciprofloxacin  400 mg Intravenous On Call to OR  . docusate sodium  200 mg Oral BID  . ezetimibe  10 mg Oral QPM  . hydrochlorothiazide  12.5 mg Oral QAC breakfast  . isosorbide mononitrate   60 mg Oral Daily  . metoprolol succinate  50 mg Oral BID  . multivitamin with minerals  1 tablet Oral Daily  . omega-3 acid ethyl esters  1 g Oral Daily  . psyllium  1 packet Oral Daily  . ramipril  10 mg Oral BID  . sodium chloride  3 mL Intravenous Q12H  . sodium chloride  3 mL Intravenous Q12H      LOS: 2 days   Clodagh Odenthal M.D. [facility] 12/05/2012, 3:12 PM Pager: [phone]  If 7PM-7AM, please contact night-coverage [email or web address] Password [email or web address]

## 2012-12-05 NOTE — Preoperative (Signed)
Beta Blockers   Reason not to administer Beta Blockers:Not Applicable 

## 2012-12-05 NOTE — Anesthesia Procedure Notes (Signed)
Procedure Name: Intubation Date/Time: 12/05/2012 4:41 PM Performed by: Lovie Chol Pre-anesthesia Checklist: Patient identified, Emergency Drugs available, Suction available, Patient being monitored and Timeout performed Patient Re-evaluated:Patient Re-evaluated prior to inductionOxygen Delivery Method: Circle system utilized Preoxygenation: Pre-oxygenation with 100% oxygen Intubation Type: IV induction Ventilation: Mask ventilation without difficulty Laryngoscope Size: Miller and 2 Grade View: Grade II Tube type: Oral Tube size: 7.5 mm Number of attempts: 1 Airway Equipment and Method: Stylet Placement Confirmation: ETT inserted through vocal cords under direct vision,  positive ETCO2,  CO2 detector and breath sounds checked- equal and bilateral Secured at: 22 cm Tube secured with: Tape Dental Injury: Teeth and Oropharynx as per pre-operative assessment

## 2012-12-06 ENCOUNTER — Encounter (HOSPITAL_COMMUNITY): Payer: Self-pay | Admitting: General Surgery

## 2012-12-06 DIAGNOSIS — K810 Acute cholecystitis: Secondary | ICD-10-CM

## 2012-12-06 DIAGNOSIS — I4891 Unspecified atrial fibrillation: Secondary | ICD-10-CM | POA: Diagnosis not present

## 2012-12-06 HISTORY — DX: Acute cholecystitis: K81.0

## 2012-12-06 LAB — PREPARE FRESH FROZEN PLASMA
Unit division: 0
Unit division: 0

## 2012-12-06 LAB — BASIC METABOLIC PANEL
BUN: 19 mg/dL (ref 6–23)
CO2: 27 mEq/L (ref 19–32)
Calcium: 8.8 mg/dL (ref 8.4–10.5)
Chloride: 94 mEq/L — ABNORMAL LOW (ref 96–112)
Creatinine, Ser: 0.94 mg/dL (ref 0.50–1.35)
GFR calc Af Amer: 87 mL/min — ABNORMAL LOW (ref >90)
GFR calc non Af Amer: 75 mL/min — ABNORMAL LOW (ref >90)
Glucose, Bld: 94 mg/dL (ref 70–99)
Potassium: 4.3 mEq/L (ref 3.5–5.1)
Sodium: 132 mEq/L — ABNORMAL LOW (ref 135–145)

## 2012-12-06 LAB — CBC
HCT: 45 % (ref 39.0–52.0)
Hemoglobin: 15.1 g/dL (ref 13.0–17.0)
MCH: 31.9 pg (ref 26.0–34.0)
MCHC: 33.6 g/dL (ref 30.0–36.0)
MCV: 94.9 fL (ref 78.0–100.0)
Platelets: 115 10*3/uL — ABNORMAL LOW (ref 150–400)
RBC: 4.74 MIL/uL (ref 4.22–5.81)
RDW: 15.4 % (ref 11.5–15.5)
WBC: 11.1 10*3/uL — ABNORMAL HIGH (ref 4.0–10.5)

## 2012-12-06 LAB — PROTIME-INR
INR: 1.43 (ref 0.00–1.49)
Prothrombin Time: 17.1 seconds — ABNORMAL HIGH (ref 11.6–15.2)

## 2012-12-06 MED ORDER — CIPROFLOXACIN HCL 500 MG PO TABS
500.0000 mg | ORAL_TABLET | Freq: Two times a day (BID) | ORAL | Status: DC
  Administered 2012-12-06 – 2012-12-07 (×3): 500 mg via ORAL
  Filled 2012-12-06 (×5): qty 1

## 2012-12-06 MED ORDER — WARFARIN SODIUM 7.5 MG PO TABS
7.5000 mg | ORAL_TABLET | Freq: Once | ORAL | Status: AC
  Administered 2012-12-06: 7.5 mg via ORAL
  Filled 2012-12-06: qty 1

## 2012-12-06 MED ORDER — HEPARIN SODIUM (PORCINE) 5000 UNIT/ML IJ SOLN
5000.0000 [IU] | Freq: Three times a day (TID) | INTRAMUSCULAR | Status: DC
  Administered 2012-12-07: 5000 [IU] via SUBCUTANEOUS
  Filled 2012-12-06 (×4): qty 1

## 2012-12-06 MED ORDER — POLYETHYLENE GLYCOL 3350 17 G PO PACK
17.0000 g | PACK | Freq: Once | ORAL | Status: DC
  Filled 2012-12-06: qty 1

## 2012-12-06 MED ORDER — WARFARIN - PHARMACIST DOSING INPATIENT: Freq: Every day | Status: DC

## 2012-12-06 NOTE — ED Provider Notes (Signed)
Shared service with midlevel provider. I have personally seen and examined the patient, providing direct face to face care, presenting with the chief complaint of sudden onset chest pain, abd pain radiating to the back with diophroesis. Physical exam findings include good distal pulses and no murmur, no neuro deficits and diffuse abd pain. CT chest and abd neg - no dissection, AAA. Plan will be admission for further evaluation. I have reviewed the nursing documentation on past medical history, family history, and social history.   Derwood Kaplan, MD 12/06/12 [phone]

## 2012-12-06 NOTE — Progress Notes (Signed)
Patient interviewed and examined, agree with PA note above. Patient has voided now and looks well with a benign abdomen. Likely came to home tomorrow. Mariella Saa MD, FACS  12/06/2012 6:27 PM

## 2012-12-06 NOTE — Progress Notes (Signed)
Patient ID: Tyler Williams, male   DOB: March 03, 1926, 79 y.o.   MRN: [id number] 1 Day Post-Op  Subjective: Pt states he is doing well today. He has concerns that he has not urinated since surgery and has discomfort, per patient; nursing reports 2 small voids overnight. His pain is well controlled and improved. He is tolerating a clear liquid diet. He has not attempted to ambulate yet. No flatus or BM.   Objective: Vital signs in last 24 hours: Temp:  [97.2 F (36.2 C)-99.6 F (37.6 C)] 97.5 F (36.4 C) (10/24 0526) Pulse Rate:  [61-89] 61 (10/24 0526) Resp:  [12-23] 18 (10/24 0526) BP: (115-161)/(50-112) 132/58 mmHg (10/24 0526) SpO2:  [91 %-100 %] 96 % (10/24 0526) Arterial Line BP: (145-172)/(50-72) 145/50 mmHg (10/23 1930) Last BM Date: 12/05/12  Intake/Output from previous day: 10/23 0701 - 10/24 0700 In: 2140 [I.V.:1400; Blood:490; IV Piggyback:250] Out: 220 [Drains:220] Intake/Output this shift:    PE: Gen: Sitting on side of bed, attempting to eat clear liquid diet CV: irregularly irregular with 2/6 systolic murmur at the LSB Chest: CTAB.  Abd: Soft. Appropriately tender. moderately distended. BS positive. Drain with 30mL sanguinous fluid (~250cc/12h). Incisions c/d/i. Small amount of drainage on drain dressing, have marked for reference today.  Ext: no erythema, edema or tenderness.   Lab Results:   Recent Labs  12/05/12 0220 12/06/12 0615  WBC 16.5* 11.1*  HGB 15.4 15.1  HCT 45.3 45.0  PLT 115* 115*   BMET  Recent Labs  12/03/12 1803 12/05/12 0220  NA 138 134*  K 4.2 3.9  CL 102 99  CO2 26 25  GLUCOSE 120* 110*  BUN 18 15  CREATININE 0.79 0.77  CALCIUM 9.0 8.3*   PT/INR  Recent Labs  12/05/12 2155 12/06/12 0615  LABPROT 17.8* 17.1*  INR 1.51* 1.43   CMP     Component Value Date/Time   NA 134* 12/05/2012 0220   K 3.9 12/05/2012 0220   CL 99 12/05/2012 0220   CO2 25 12/05/2012 0220   GLUCOSE 110* 12/05/2012 0220   BUN 15 12/05/2012 0220   CREATININE 0.77 12/05/2012 0220   CALCIUM 8.3* 12/05/2012 0220   PROT 6.4 12/05/2012 0220   ALBUMIN 3.1* 12/05/2012 0220   AST 26 12/05/2012 0220   ALT 21 12/05/2012 0220   ALKPHOS 60 12/05/2012 0220   BILITOT 2.5* 12/05/2012 0220   GFRNONAA 81* 12/05/2012 0220   GFRAA >90 12/05/2012 0220   Lipase  No results found for this basename: lipase   Studies/Results: No results found.  Anti-infectives: Anti-infectives   Start     Dose/Rate Route Frequency Ordered Stop   12/05/12 0600  [MAR Hold]  ciprofloxacin (CIPRO) IVPB 400 mg     (On MAR Hold since 12/05/12 1528)  Comments:  Pharmacy may adjust dosing strength, interval, or rate of medication as needed for optimal therapy for the patient Send with patient on call to the OR.  Anesthesia to complete antibiotic administration <90min prior to incision per [facility].   400 mg 200 mL/hr over 60 Minutes Intravenous On call to O.R. 12/04/12 1624 12/05/12 1625       Assessment: Biliary pain with cholelithiasis; no cholecystitis.  S/p cholecystectomy for Severe acute gangrenous cholecystitis Leukocytosis A.fibrillation, chronic coumadin CAD s/p PTCA 05/2012 FAILED, tight RCA  Aortic Stenosis  Bilat PVD/ SMA stenosis/Mild carotid stenosis bilat 11/2011  AAA repair 1993  Hypertension  GERD  Plan: 1. Severe acute gangrenous cholecystitis: S/P cholecystectomy POD#1 pt tolerated  procedure well. Still without BM/flatus.  2. Leukocytosis: Improving s/p abx coverage and s/p chole 3. A. Fib: currently controlled. Will restart coumadin per pharmacy today. Ordered placed.  4. Likely start DVT prophylaxis tomorrow, pending appropriate drainage.  5. Diet: Advance as tolerated (heart healthy)  6. Encourage patient to ambulate today. 7. Urine output: diminished urine ouptut, post void residual ordered. If greater then 350, would consider flomax and/or cath.     LOS: 3 days    Felix Pacini 12/06/2012, 7:44 AM Pager: [phone]

## 2012-12-06 NOTE — Progress Notes (Signed)
    Subjective:  Expected abdominal discomfort post-operatively. No chest pain or shortness of breath.   Objective:  Vital Signs in the last 24 hours: Temp:  [97.2 F (36.2 C)-99.6 F (37.6 C)] 97.5 F (36.4 C) (10/24 0526) Pulse Rate:  [61-89] 61 (10/24 0526) Resp:  [12-23] 18 (10/24 0526) BP: (115-161)/(50-112) 132/58 mmHg (10/24 0526) SpO2:  [91 %-100 %] 96 % (10/24 0526) Arterial Line BP: (145-172)/(50-72) 145/50 mmHg (10/23 1930)  Intake/Output from previous day: 10/23 0701 - 10/24 0700 In: 2140 [I.V.:1400; Blood:490; IV Piggyback:250] Out: 180 [Drains:180]  Physical Exam: Pt is alert and oriented, pleasant elderly male in NAD. HEENT: normal Neck: JVP - normal Lungs: CTA bilaterally CV: irregularly irregular with 2/6 systolic murmur at the LSB Abd: soft, Positive BS Ext: no C/C/E Skin: warm/dry no rash   Lab Results:  Recent Labs  12/03/12 1803 12/05/12 0220  WBC 9.8 16.5*  HGB 16.3 15.4  PLT 142* 115*    Recent Labs  12/03/12 1803 12/05/12 0220  NA 138 134*  K 4.2 3.9  CL 102 99  CO2 26 25  GLUCOSE 120* 110*  BUN 18 15  CREATININE 0.79 0.77    Recent Labs  12/04/12 0538 12/04/12 1049  TROPONINI <0.30 <0.30    Cardiac Studies: none  Tele: Atrial fib with PVC's, personally reviewed  Assessment/Plan:  1. CAD - native vessel. No ischemic symptoms now POD#1.  2. Chronic atrial fibrillation, controlled 3. Mild aortic stenosis 4. HTN - controlled  Appears stable now POD#1 after cholecystectomy for acute cholecystitis. Would continue current cardiac meds and resume coumadin without heparin bridging when OK with surgical team.  Tonny Bollman, M.D. 12/06/2012, 6:38 AM

## 2012-12-06 NOTE — Progress Notes (Signed)
Patient ID: Tyler Williams  male  ZOX:[id number]    DOB: 1926/07/05    DOA: 12/03/2012  PCP: Minda Meo, MD  Assessment/Plan: Principal Problem:   Chest pain, atypical with known history of coronary artery disease, cardiac cath in for 2014, had a 99% mid RCA lesion, unsuccessful PCI, unable to use Rotablator.  - Ruled out acute ACS however given high risk patient cardiology was consulted. - Per Dr. Gala Romney, no further cardiac workup is needed at this time, cardiac enzymes are negative and no acute EKG changes, symptoms likely not anginal.   Right upper quadrant Abdominal pain: Likely due to acute cholecystitis, status post laparoscopic cholecystectomy, postop day 1 - CT abdomen pelvis had shown cholelithiasis, [facility] was consulted, underwent lap chole 10/23, op note by Dr Johna Sheriff states severe acute gangrenous cholecystitis.  - Patient received ciprofloxacin prior to OR, will continue oral Cipro 500mg  BID x 10days     Atrial fibrillation - Currently rate controlled, Coumadin on hold, will need to restart anticoagulation when okay with surgery, today evening?    Aortic stenosis - Follow outpatient with cardiology  DVT Prophylaxis:  Code Status:  Disposition: Possible tomorrow    Subjective: S/p lap chole, pod #1, feels okay, no fevers, +drain   Objective: Weight change:   Intake/Output Summary (Last 24 hours) at 12/06/12 0920 Last data filed at 12/06/12 0600  Gross per 24 hour  Intake   2140 ml  Output    220 ml  Net   1920 ml   Blood pressure 132/58, pulse 61, temperature 97.5 F (36.4 C), temperature source Axillary, resp. rate 18, height 5\' 9"  (1.753 m), weight 86.773 kg (191 lb 4.8 oz), SpO2 96.00%.  Physical Exam: General: A x O x3, NAD CVS: S1-S2 clear, 2/6 SEM at RUSB Chest: CTAB, no wheezing, rales or rhonchi Abdomen: soft mild tenderness in the RUQ, NBS +drain Extremities: no c/c/e    Lab Results: Basic Metabolic Panel:  Recent Labs Lab  12/05/12 0220 12/06/12 0615  NA 134* 132*  K 3.9 4.3  CL 99 94*  CO2 25 27  GLUCOSE 110* 94  BUN 15 19  CREATININE 0.77 0.94  CALCIUM 8.3* 8.8   Liver Function Tests:  Recent Labs Lab 12/04/12 1528 12/05/12 0220  AST 27 26  ALT 25 21  ALKPHOS 64 60  BILITOT 2.1* 2.5*  PROT 6.8 6.4  ALBUMIN 3.3* 3.1*   No results found for this basename: LIPASE, AMYLASE,  in the last 168 hours No results found for this basename: AMMONIA,  in the last 168 hours CBC:  Recent Labs Lab 12/05/12 0220 12/06/12 0615  WBC 16.5* 11.1*  HGB 15.4 15.1  HCT 45.3 45.0  MCV 93.2 94.9  PLT 115* 115*   Cardiac Enzymes:  Recent Labs Lab 12/04/12 0118 12/04/12 0538 12/04/12 1049  TROPONINI <0.30 <0.30 <0.30   BNP: No components found with this basename: POCBNP,  CBG: No results found for this basename: GLUCAP,  in the last 168 hours   Micro Results: Recent Results (from the past 240 hour(s))  SURGICAL PCR SCREEN     Status: None   Collection Time    12/05/12 12:54 PM      Result Value Range Status   MRSA, PCR NEGATIVE  NEGATIVE Final   Staphylococcus aureus NEGATIVE  NEGATIVE Final   Comment:            The Xpert SA Assay (FDA     approved for NASAL specimens  in patients over 73 years of age),     is one component of     a comprehensive surveillance     program.  Test performance has     been validated by [employer] for patients greater     than or equal to 25 year old.     It is not intended     to diagnose infection nor to     guide or monitor treatment.    Studies/Results: Dg Chest 2 View  12/03/2012   CLINICAL DATA:  Chest pain and back pain  EXAM: CHEST  2 VIEW  COMPARISON:  05/22/2012  FINDINGS: Negative for heart failure or pneumonia. Negative for infiltrate or effusion or mass lesion. Overall improved aeration from the prior study.  IMPRESSION: No active cardiopulmonary disease.   Electronically Signed   By: Marlan Palau M.D.   On: 12/03/2012 18:38    Ct Angio Abdomen W/cm &/or Wo Contrast  12/03/2012   CLINICAL DATA:  Rule out dissection. Mid chest pain.  EXAM: CT ANGIOGRAPHY CHEST within without AND ABDOMEN with  TECHNIQUE: Initial noncontrast CT the chest. Multidetector CT imaging of the chest and abdomen was performed using the standard protocol during bolus administration of intravenous contrast. Multiplanar CT image reconstructions including MIPs were obtained to evaluate the vascular anatomy.  CONTRAST:  OMNIPAQUE IOHEXOL 350 MG/ML SOLN  COMPARISON:  None.  FINDINGS: CTA CHEST FINDINGS  Non IV contrast images demonstrate no intramural hematoma within the thoracic aorta. Contrast enhanced imaging demonstrates no dissection or aneurysm of the ascending thoracic aorta, transverse thoracic aorta, or descending thoracic aorta. There is moderate intimal calcification. The great vessels are normal.  There is no central pulmonary embolism present. No pericardial fluid. Coronary artery calcifications are noted in all 3 coronary arteries.  No axillary supraclavicular lymphadenopathy. No mediastinal hilar lymphadenopathy. Esophagus is normal.  Review of the lung parenchyma demonstrates nodular atelectasis at the right lung base measuring 15 x 19 mm (image 92). There is a thin-walled cyst at the right lung base additionally.  Review of the MIP images confirms the above findings.  CTA ABDOMEN FINDINGS  No evidence of dissection or aneurysm of the abdominal aorta. The SMA and celiac trunk are patent. 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 There is ostial calcifications of the left and right renal arteries. There is bi-iliac bypass graft anatomy which is incompletely image. The IMA is diminutive in likely sacrificed and back filling.  No focal hepatic lesion. There are multiple gallstones layering within the gallbladder lumen. The pancreas, spleen, adrenal  glands are normal. There is bilateral nonenhancing renal cysts  The stomach limited view of the small bowel and colon are unremarkable. Limited view of the appendix is normal. No retroperitoneal lymphadenopathy.  Review of the MIP images confirms the above findings.  IMPRESSION: 1. No evidence of aortic dissection or aneurysm of the thoracic aorta or abdominal aorta.  2. No evidence of pulmonary embolism.  3.  Extensive coronary artery calcifications noted.  4. Nodular atelectasis at the right lung base. Recommend followup CT in 3 months.  5. 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 Extensive atherosclerotic disease of the renal arteries.  6. Cholelithiasis without cholecystitis.   Electronically Signed   By: Genevive Bi M.D.   On: 12/03/2012 20:30   Ct Angio Chest Aortic Dissect W &/or W/o  12/03/2012  CLINICAL DATA:  Rule out dissection. Mid chest pain.  EXAM: CT ANGIOGRAPHY CHEST within without AND ABDOMEN with  TECHNIQUE: Initial noncontrast CT the chest. Multidetector CT imaging of the chest and abdomen was performed using the standard protocol during bolus administration of intravenous contrast. Multiplanar CT image reconstructions including MIPs were obtained to evaluate the vascular anatomy.  CONTRAST:  OMNIPAQUE IOHEXOL 350 MG/ML SOLN  COMPARISON:  None.  FINDINGS: CTA CHEST FINDINGS  Non IV contrast images demonstrate no intramural hematoma within the thoracic aorta. Contrast enhanced imaging demonstrates no dissection or aneurysm of the ascending thoracic aorta, transverse thoracic aorta, or descending thoracic aorta. There is moderate intimal calcification. The great vessels are normal.  There is no central pulmonary embolism present. No pericardial fluid. Coronary artery calcifications are noted in all 3 coronary arteries.  No axillary supraclavicular lymphadenopathy. No mediastinal hilar lymphadenopathy. Esophagus is normal.  Review of the lung parenchyma demonstrates nodular  atelectasis at the right lung base measuring 15 x 19 mm (image 92). There is a thin-walled cyst at the right lung base additionally.  Review of the MIP images confirms the above findings.  CTA ABDOMEN FINDINGS  No evidence of dissection or aneurysm of the abdominal aorta. The SMA and celiac trunk are patent. 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 There is ostial calcifications of the left and right renal arteries. There is bi-iliac bypass graft anatomy which is incompletely image. The IMA is diminutive in likely sacrificed and back filling.  No focal hepatic lesion. There are multiple gallstones layering within the gallbladder lumen. The pancreas, spleen, adrenal glands are normal. There is bilateral nonenhancing renal cysts  The stomach limited view of the small bowel and colon are unremarkable. Limited view of the appendix is normal. No retroperitoneal lymphadenopathy.  Review of the MIP images confirms the above findings.  IMPRESSION: 1. No evidence of aortic dissection or aneurysm of the thoracic aorta or abdominal aorta.  2. No evidence of pulmonary embolism.  3.  Extensive coronary artery calcifications noted.  4. Nodular atelectasis at the right lung base. Recommend followup CT in 3 months.  5. 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 Extensive atherosclerotic disease of the renal arteries.  6. Cholelithiasis without cholecystitis.   Electronically Signed   By: Genevive Bi M.D.   On: 12/03/2012 20:30    Medications: Scheduled Meds: . atorvastatin  20 mg Oral QPM  . cholecalciferol  5,000 Units Oral Daily  . docusate sodium  200 mg Oral BID  . ezetimibe  10 mg Oral QPM  . hydrochlorothiazide  12.5 mg Oral QAC breakfast  . isosorbide mononitrate  60 mg Oral Daily  . metoprolol succinate  50 mg Oral BID  . multivitamin with minerals  1 tablet Oral Daily  . omega-3 acid ethyl esters   1 g Oral Daily  . psyllium  1 packet Oral Daily  . ramipril  10 mg Oral BID  . sodium chloride  3 mL Intravenous Q12H  . sodium chloride  3 mL Intravenous Q12H      LOS: 3 days   Savannah Morford M.D. [facility] 12/06/2012, 9:20 AM Pager: [phone]  If 7PM-7AM, please contact night-coverage [email or web address] Password [email or web address]

## 2012-12-06 NOTE — Progress Notes (Signed)
ANTICOAGULATION CONSULT NOTE - Follow Up Consult  Pharmacy Consult for Coumadin Indication: atrial fibrillation  No Known Allergies  Patient Measurements: Height: 5\' 9"  (175.3 cm) Weight: 191 lb 4.8 oz (86.773 kg) IBW/kg (Calculated) : 70.7  Vital Signs: Temp: 97.5 F (36.4 C) (10/24 0526) Temp src: Axillary (10/24 0526) BP: 164/88 mmHg (10/24 1043) Pulse Rate: 70 (10/24 1043)  Labs:  Recent Labs  12/03/12 1803  12/04/12 0118 12/04/12 0538 12/04/12 1049 12/05/12 0220 12/05/12 2155 12/06/12 0615  HGB 16.3  --   --   --   --  15.4  --  15.1  HCT 46.4  --   --   --   --  45.3  --  45.0  PLT 142*  --   --   --   --  115*  --  115*  LABPROT  --   < >  --  20.7*  --  22.3* 17.8* 17.1*  INR  --   < >  --  1.84*  --  2.03* 1.51* 1.43  HEPARINUNFRC  --   --   --   --   --  0.14*  --   --   CREATININE 0.79  --   --   --   --  0.77  --  0.94  TROPONINI  --   --  <0.30 <0.30 <0.30  --   --   --   < > = values in this interval not displayed.  Estimated Creatinine Clearance: 63.8 ml/min (by C-G formula based on Cr of 0.94).   Medications:  Prescriptions prior to admission  Medication Sig Dispense Refill  . atorvastatin (LIPITOR) 20 MG tablet Take 20 mg by mouth every evening.      . Cholecalciferol (VITAMIN D-3) 5000 UNITS TABS Take 5,000 Units by mouth daily.       . Coenzyme Q10 (CO Q 10) 100 MG CAPS Take 1 capsule by mouth 2 (two) times daily.      Marland Kitchen ezetimibe (ZETIA) 10 MG tablet Take 10 mg by mouth every evening.      . hydrochlorothiazide (MICROZIDE) 12.5 MG capsule Take 1 capsule (12.5 mg total) by mouth daily before breakfast.  30 capsule  6  . ibuprofen (ADVIL,MOTRIN) 200 MG tablet Take 400 mg by mouth every 6 (six) hours as needed for pain.      . isosorbide mononitrate (IMDUR) 60 MG 24 hr tablet Take 1 tablet (60 mg total) by mouth daily.  30 tablet  4  . LORazepam (ATIVAN) 1 MG tablet Take 0.5-1 mg by mouth every 8 (eight) hours as needed for anxiety.       .  metoprolol succinate (TOPROL-XL) 50 MG 24 hr tablet Take 1 tablet (50 mg total) by mouth 2 (two) times daily. Take with or immediately following a meal.  60 tablet  3  . Multiple Vitamins-Minerals (CENTRUM SILVER PO) Take 1 tablet by mouth daily.      . Multiple Vitamins-Minerals (OCUVITE PRESERVISION PO) Take 1 tablet by mouth daily.      . nitroGLYCERIN (NITROSTAT) 0.4 MG SL tablet Place 1 tablet (0.4 mg total) under the tongue every 5 (five) minutes x 3 doses as needed for chest pain.  30 tablet  4  . omega-3 acid ethyl esters (LOVAZA) 1 G capsule Take 1 g by mouth daily.      . Psyllium (METAMUCIL) 30.9 % POWD Take 1 packet by mouth 3 times/day as needed-between meals & bedtime.       Marland Kitchen  ramipril (ALTACE) 10 MG capsule Take 10 mg by mouth 2 (two) times daily.      . Ranibizumab (LUCENTIS) 0.5 MG/0.05ML SOLN Inject 0.5 mg into the eye See admin instructions. Pt gets this injection every 9 weeks. Next treatment is due on 01-18-12. Pt is followed by [facility].  Gets 1 shot at a time, one week apart.      . warfarin (COUMADIN) 5 MG tablet Take 5-7.5 mg by mouth See admin instructions. Takes 7.5 mg (1.5 tablets) on mondays and 5 mg (1 tablet) all other days        Assessment: 79 yo M on Coumadin PTA for hx afib.  Coumadin was reversed for lap-chole 10/23.  Pt received a total of 15 mg SQ Vit K and FFP for reversal.  INR subtherapeutic as expected.  Anticipate pt will demonstrate Coumadin resistance for a few days due to high doses of Vitamin K.  Will not give higher doses to overcome resistance at this time due to recent surgery and drain still in place.  Also noted patient on Cipro x 10 days which can interact with Coumadin and elevate INR.  Home Coumadin dose = 5mg  daily except 7.5mg  on Mondays.  Goal of Therapy:  INR 2-3   Plan:  Coumadin 7.5mg  PO x 1 tonight. Daily INR.  [employer], Pharm.D., BCPS Clinical Pharmacist Pager [phone] 12/06/2012 11:28 AM

## 2012-12-06 NOTE — Plan of Care (Signed)
Problem: Phase II Progression Outcomes Goal: Progress activity as tolerated unless otherwise ordered Outcome: Completed/Met Date Met:  12/06/12 Ambulating independently in hallways multiple times today Goal: Return of bowel function (flatus, BM) IF ABDOMINAL SURGERY:  Outcome: Progressing Patient states he has been passing flatus this afternoon Goal: Other Phase II Outcomes/Goals Outcome: Completed/Met Date Met:  12/06/12 Demonstrated to patient, wife, and daughter in law how to empty and recharge JP drain.  Daughter-in-law is familiar with drain as she has had them in the past.

## 2012-12-06 NOTE — Care Management Note (Signed)
    Page 1 of 1   12/06/2012     3:17:36 PM   CARE MANAGEMENT NOTE 12/06/2012  Patient:  Tyler Williams, Tyler Williams   Account Number:  [id number]  Date Initiated:  12/06/2012  Documentation initiated by:  GRAVES-BIGELOW,Chaniyah Jahr  Subjective/Objective Assessment:   Pt admitted for cp and s/p lap chole. Pt has jp drain and is from home with wife.     Action/Plan:   CM did speak to pt in regards to [facility] services if he is d/c with the drain. Pt states he will not need HHRN at this time. He states that if he is d/c with drain that he and wife can manage. No needs from CM at this time.   Anticipated DC Date:  12/07/2012   Anticipated DC Plan:  HOME/SELF CARE      DC Planning Services  CM consult      Choice offered to / List presented to:             Status of service:  Completed, signed off Medicare Important Message given?   (If response is "NO", the following Medicare IM given date fields will be blank) Date Medicare IM given:   Date Additional Medicare IM given:    Discharge Disposition:  HOME/SELF CARE  Per UR Regulation:  Reviewed for med. necessity/level of care/duration of stay  If discussed at Long Length of Stay Meetings, dates discussed:    Comments:

## 2012-12-06 NOTE — Progress Notes (Signed)
Patient voided 250 cc urine, post void bladder scan 220.  Patient void 150 right after bladder scan.  Will continue to monitor.  Colman Cater

## 2012-12-07 DIAGNOSIS — I4891 Unspecified atrial fibrillation: Secondary | ICD-10-CM | POA: Diagnosis not present

## 2012-12-07 DIAGNOSIS — R109 Unspecified abdominal pain: Secondary | ICD-10-CM | POA: Diagnosis not present

## 2012-12-07 DIAGNOSIS — I359 Nonrheumatic aortic valve disorder, unspecified: Secondary | ICD-10-CM | POA: Diagnosis not present

## 2012-12-07 DIAGNOSIS — I2510 Atherosclerotic heart disease of native coronary artery without angina pectoris: Secondary | ICD-10-CM | POA: Diagnosis not present

## 2012-12-07 DIAGNOSIS — R079 Chest pain, unspecified: Secondary | ICD-10-CM | POA: Diagnosis not present

## 2012-12-07 LAB — CBC
HCT: 41.5 % (ref 39.0–52.0)
Hemoglobin: 14.3 g/dL (ref 13.0–17.0)
MCH: 32.2 pg (ref 26.0–34.0)
MCHC: 34.5 g/dL (ref 30.0–36.0)
MCV: 93.5 fL (ref 78.0–100.0)
Platelets: 130 10*3/uL — ABNORMAL LOW (ref 150–400)
RBC: 4.44 MIL/uL (ref 4.22–5.81)
RDW: 14.7 % (ref 11.5–15.5)
WBC: 8.2 10*3/uL (ref 4.0–10.5)

## 2012-12-07 LAB — BASIC METABOLIC PANEL
BUN: 19 mg/dL (ref 6–23)
CO2: 25 mEq/L (ref 19–32)
Calcium: 8.4 mg/dL (ref 8.4–10.5)
Chloride: 95 mEq/L — ABNORMAL LOW (ref 96–112)
Creatinine, Ser: 0.9 mg/dL (ref 0.50–1.35)
GFR calc Af Amer: 88 mL/min — ABNORMAL LOW (ref >90)
GFR calc non Af Amer: 76 mL/min — ABNORMAL LOW (ref >90)
Glucose, Bld: 92 mg/dL (ref 70–99)
Potassium: 4.1 mEq/L (ref 3.5–5.1)
Sodium: 129 mEq/L — ABNORMAL LOW (ref 135–145)

## 2012-12-07 LAB — PROTIME-INR
INR: 1.2 (ref 0.00–1.49)
Prothrombin Time: 14.9 seconds (ref 11.6–15.2)

## 2012-12-07 MED ORDER — OXYCODONE-ACETAMINOPHEN 5-325 MG PO TABS: 1.0000 | ORAL_TABLET | Freq: Four times a day (QID) | ORAL | Status: DC | PRN

## 2012-12-07 MED ORDER — CIPROFLOXACIN HCL 500 MG PO TABS: 500.0000 mg | ORAL_TABLET | Freq: Two times a day (BID) | ORAL | Status: DC

## 2012-12-07 MED ORDER — DSS 100 MG PO CAPS: 200.0000 mg | ORAL_CAPSULE | Freq: Two times a day (BID) | ORAL | Status: DC

## 2012-12-07 MED ORDER — WARFARIN SODIUM 7.5 MG PO TABS
7.5000 mg | ORAL_TABLET | Freq: Once | ORAL | Status: DC
  Filled 2012-12-07: qty 1

## 2012-12-07 NOTE — Discharge Summary (Signed)
Physician Discharge Summary  Patient ID: Tyler Williams MRN: [id number] DOB/AGE: April 17, 1926 79 y.o.  Admit date: 12/03/2012 Discharge date: 12/07/2012  Primary Care Physician:  Tyler Meo, MD  Final Discharge Diagnoses:   Acute severe gangrenous cholecystitis status post laparoscopic cholecystectomy  Secondary diagnoses  . Chest pain, atypical- likely referred from the acute cholecystitis  . Atrial fibrillation on coumadin . Aortic stenosis . CAD, NATIVE VESSEL . HYPERCHOLESTEROLEMIA  IIA   Consults:  Cardiology, Dr. Gala Williams                     Vascular surgery, Dr. Hart Williams                      General surgery, Dr. Johna Williams     Recommendations for Outpatient Follow-up:  1. patient was recommended to followup in the Coumadin clinic in 5 days 2. surgery office will contact him in a week for drain removal    Allergies:  No Known Allergies   Discharge Medications:   Medication List         atorvastatin 20 MG tablet  Commonly known as:  LIPITOR  Take 20 mg by mouth every evening.     ciprofloxacin 500 MG tablet  Commonly known as:  CIPRO  Take 1 tablet (500 mg total) by mouth 2 (two) times daily. X 7 days     Co Q 10 100 MG Caps  Take 1 capsule by mouth 2 (two) times daily.     DSS 100 MG Caps  Take 200 mg by mouth 2 (two) times daily.     ezetimibe 10 MG tablet  Commonly known as:  ZETIA  Take 10 mg by mouth every evening.     hydrochlorothiazide 12.5 MG capsule  Commonly known as:  MICROZIDE  Take 1 capsule (12.5 mg total) by mouth daily before breakfast.     ibuprofen 200 MG tablet  Commonly known as:  ADVIL,MOTRIN  Take 400 mg by mouth every 6 (six) hours as needed for pain.     isosorbide mononitrate 60 MG 24 hr tablet  Commonly known as:  IMDUR  Take 1 tablet (60 mg total) by mouth daily.     LORazepam 1 MG tablet  Commonly known as:  ATIVAN  Take 0.5-1 mg by mouth every 8 (eight) hours as needed for anxiety.     LUCENTIS 0.5  MG/0.05ML Soln  Generic drug:  ranibizumab  Inject 0.5 mg into the eye See admin instructions. Pt gets this injection every 9 weeks. Next treatment is due on 01-18-12. Pt is followed by [facility].  Gets 1 shot at a time, one week apart.     METAMUCIL 30.9 % Powd  Generic drug:  Psyllium  Take 1 packet by mouth 3 times/day as needed-between meals & bedtime.     metoprolol succinate 50 MG 24 hr tablet  Commonly known as:  TOPROL-XL  Take 1 tablet (50 mg total) by mouth 2 (two) times daily. Take with or immediately following a meal.     nitroGLYCERIN 0.4 MG SL tablet  Commonly known as:  NITROSTAT  Place 1 tablet (0.4 mg total) under the tongue every 5 (five) minutes x 3 doses as needed for chest pain.     OCUVITE PRESERVISION PO  Take 1 tablet by mouth daily.     CENTRUM SILVER PO  Take 1 tablet by mouth daily.     omega-3 acid ethyl esters 1 G capsule  Commonly known as:  LOVAZA  Take 1 g by mouth daily.     oxyCODONE-acetaminophen 5-325 MG per tablet  Commonly known as:  PERCOCET/ROXICET  Take 1 tablet by mouth every 6 (six) hours as needed.     ramipril 10 MG capsule  Commonly known as:  ALTACE  Take 10 mg by mouth 2 (two) times daily.     Vitamin D-3 5000 UNITS Tabs  Take 5,000 Units by mouth daily.     warfarin 5 MG tablet  Commonly known as:  COUMADIN  Take 5-7.5 mg by mouth See admin instructions. Takes 7.5 mg (1.5 tablets) on mondays and 5 mg (1 tablet) all other days         Brief H and P: For complete details please refer to admission H and P, but in brief Tyler Williams is an 79 y.o. male with Hx of known CAD s/p RCA PCI unsuccessfully 4/14, hx of microscopic hematuria followed by urologist, SSS, Aortic stenosis (non critical), PAF on coumadin with therapeutic INR, hx of GERD, HTN, hyperlipidemia, was in his usual state of health with no chest pain on exertion, developed SSCPressure, with lower abdominal pain after eating carrots a day before  admission. He had some nausea but no diarrhea, fever or chills, black or bloody stools. He had eating out at noon having had salad and soup, which his son had as well, and wasn't ill. He took 2 SLNTGs, and received another 2 SL en route, without significant difference. In the ER, he was given morphine, with total relief. His ED work up included an EKG showing afib/NSR with RBBB patterns, no ischemic changes. He had a chest CTPA negative for PE, or dissection. Abdominal CT showed no dissection, stenosis of SMA with collateral. His WBC and Hb, along with renal fx tests and electrolytes were all negative.  Hospital Course:  Chest pain, atypical with known history of coronary artery disease, cardiac cath in for 2014, had a 99% mid RCA lesion, unsuccessful PCI, unable to use Rotablator. Given his high risk for coronary artery disease and ACS, cardiology was consulted, patient was ruled out for acute coronary syndrome. Patient was seen by Dr. Gala Williams, did not recommend any further cardiac at this time. Cardiac enzymes remained negative and patient had no acute EKG changes and symptoms were likely not anginal. Patient was also noticed to have right upper quadrant tenderness.  Acute severe gangrenous cholecystitis: Due to complaints of right upper quadrant abdominal pain, CT abdomen and pelvis was done which showed cholelithiasis. General surgery was consulted and patient underwent laparoscopic cholecystectomy on 10/23, op note by Dr Tyler Williams stated severe acute gangrenous cholecystitis.  Patient received ciprofloxacin prior to OR, will continue oral Cipro 500mg  BID x 7 days. Patient was cleared for discharge by Dr Tyler Williams from surgery service and recommended that office will call for drain removal in 1 week.  Atrial fibrillation - Currently rate controlled, Coumadin was placed on hold due to surgery, patient resumed Coumadin after the surgery. Patient received vitamin K and 2 units of FFP prior to the surgery to  reverse INR of 2.0   Aortic stenosis - Follow outpatient with cardiology, Tyler Williams   Day of Discharge BP 148/76  Pulse 71  Temp(Src) 98.7 F (37.1 C) (Oral)  Resp 16  Ht 5\' 9"  (1.753 m)  Wt 86.773 kg (191 lb 4.8 oz)  BMI 28.24 kg/m2  SpO2 93%  Physical Exam: General: Alert and awake oriented x3 not in any acute distress.  CVS: S1-S2 clear no murmur rubs or gallops Chest: clear to auscultation bilaterally, no wheezing rales or rhonchi Abdomen: soft min tender in RUQ, nondistended, normal bowel sounds + drain Extremities: no cyanosis, clubbing or edema noted bilaterally Neuro: Cranial nerves II-XII intact, no focal neurological deficits   The results of significant diagnostics from this hospitalization (including imaging, microbiology, ancillary and laboratory) are listed below for reference.    LAB RESULTS: Basic Metabolic Panel:  Recent Labs Lab 12/06/12 0615 12/07/12 0352  NA 132* 129*  K 4.3 4.1  CL 94* 95*  CO2 27 25  GLUCOSE 94 92  BUN 19 19  CREATININE 0.94 0.90  CALCIUM 8.8 8.4   Liver Function Tests:  Recent Labs Lab 12/04/12 1528 12/05/12 0220  AST 27 26  ALT 25 21  ALKPHOS 64 60  BILITOT 2.1* 2.5*  PROT 6.8 6.4  ALBUMIN 3.3* 3.1*   No results found for this basename: LIPASE, AMYLASE,  in the last 168 hours No results found for this basename: AMMONIA,  in the last 168 hours CBC:  Recent Labs Lab 12/06/12 0615 12/07/12 0352  WBC 11.1* 8.2  HGB 15.1 14.3  HCT 45.0 41.5  MCV 94.9 93.5  PLT 115* 130*   Cardiac Enzymes:  Recent Labs Lab 12/04/12 0538 12/04/12 1049  TROPONINI <0.30 <0.30   BNP: No components found with this basename: POCBNP,  CBG: No results found for this basename: GLUCAP,  in the last 168 hours  Significant Diagnostic Studies:  Dg Chest 2 View  12/03/2012   CLINICAL DATA:  Chest pain and back pain  EXAM: CHEST  2 VIEW  COMPARISON:  05/22/2012  FINDINGS: Negative for heart failure or pneumonia. Negative for  infiltrate or effusion or mass lesion. Overall improved aeration from the prior study.  IMPRESSION: No active cardiopulmonary disease.   Electronically Signed   By: Marlan Palau M.D.   On: 12/03/2012 18:38   Ct Angio Abdomen W/cm &/or Wo Contrast  12/03/2012   CLINICAL DATA:  Rule out dissection. Mid chest pain.  EXAM: CT ANGIOGRAPHY CHEST within without AND ABDOMEN with  TECHNIQUE: Initial noncontrast CT the chest. Multidetector CT imaging of the chest and abdomen was performed using the standard protocol during bolus administration of intravenous contrast. Multiplanar CT image reconstructions including MIPs were obtained to evaluate the vascular anatomy.  CONTRAST:  OMNIPAQUE IOHEXOL 350 MG/ML SOLN  COMPARISON:  None.  FINDINGS: CTA CHEST FINDINGS  Non IV contrast images demonstrate no intramural hematoma within the thoracic aorta. Contrast enhanced imaging demonstrates no dissection or aneurysm of the ascending thoracic aorta, transverse thoracic aorta, or descending thoracic aorta. There is moderate intimal calcification. The great vessels are normal.  There is no central pulmonary embolism present. No pericardial fluid. Coronary artery calcifications are noted in all 3 coronary arteries.  No axillary supraclavicular lymphadenopathy. No mediastinal hilar lymphadenopathy. Esophagus is normal.  Review of the lung parenchyma demonstrates nodular atelectasis at the right lung base measuring 15 x 19 mm (image 92). There is a thin-walled cyst at the right lung base additionally.  Review of the MIP images confirms the above findings.  CTA ABDOMEN FINDINGS  No evidence of dissection or aneurysm of the abdominal aorta. The SMA and celiac trunk are patent. 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 There is ostial calcifications of the left and right renal arteries. There is bi-iliac bypass graft anatomy which  is incompletely image. The IMA is diminutive in likely sacrificed and back filling.  No focal hepatic lesion. There are multiple gallstones layering within the gallbladder lumen. The pancreas, spleen, adrenal glands are normal. There is bilateral nonenhancing renal cysts  The stomach limited view of the small bowel and colon are unremarkable. Limited view of the appendix is normal. No retroperitoneal lymphadenopathy.  Review of the MIP images confirms the above findings.  IMPRESSION: 1. No evidence of aortic dissection or aneurysm of the thoracic aorta or abdominal aorta.  2. No evidence of pulmonary embolism.  3.  Extensive coronary artery calcifications noted.  4. Nodular atelectasis at the right lung base. Recommend followup CT in 3 months.  5. 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 Extensive atherosclerotic disease of the renal arteries.  6. Cholelithiasis without cholecystitis.   Electronically Signed   By: Genevive Bi M.D.   On: 12/03/2012 20:30   Ct Angio Chest Aortic Dissect W &/or W/o  12/03/2012   CLINICAL DATA:  Rule out dissection. Mid chest pain.  EXAM: CT ANGIOGRAPHY CHEST within without AND ABDOMEN with  TECHNIQUE: Initial noncontrast CT the chest. Multidetector CT imaging of the chest and abdomen was performed using the standard protocol during bolus administration of intravenous contrast. Multiplanar CT image reconstructions including MIPs were obtained to evaluate the vascular anatomy.  CONTRAST:  OMNIPAQUE IOHEXOL 350 MG/ML SOLN  COMPARISON:  None.  FINDINGS: CTA CHEST FINDINGS  Non IV contrast images demonstrate no intramural hematoma within the thoracic aorta. Contrast enhanced imaging demonstrates no dissection or aneurysm of the ascending thoracic aorta, transverse thoracic aorta, or descending thoracic aorta. There is moderate intimal calcification. The great vessels are normal.  There is no central pulmonary embolism present. No pericardial fluid. Coronary artery  calcifications are noted in all 3 coronary arteries.  No axillary supraclavicular lymphadenopathy. No mediastinal hilar lymphadenopathy. Esophagus is normal.  Review of the lung parenchyma demonstrates nodular atelectasis at the right lung base measuring 15 x 19 mm (image 92). There is a thin-walled cyst at the right lung base additionally.  Review of the MIP images confirms the above findings.  CTA ABDOMEN FINDINGS  No evidence of dissection or aneurysm of the abdominal aorta. The SMA and celiac trunk are patent. 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 There is ostial calcifications of the left and right renal arteries. There is bi-iliac bypass graft anatomy which is incompletely image. The IMA is diminutive in likely sacrificed and back filling.  No focal hepatic lesion. There are multiple gallstones layering within the gallbladder lumen. The pancreas, spleen, adrenal glands are normal. There is bilateral nonenhancing renal cysts  The stomach limited view of the small bowel and colon are unremarkable. Limited view of the appendix is normal. No retroperitoneal lymphadenopathy.  Review of the MIP images confirms the above findings.  IMPRESSION: 1. No evidence of aortic dissection or aneurysm of the thoracic aorta or abdominal aorta.  2. No evidence of pulmonary embolism.  3.  Extensive coronary artery calcifications noted.  4. Nodular atelectasis at the right lung base. Recommend followup CT in 3 months.  5. 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 Extensive atherosclerotic disease of the renal arteries.  6. Cholelithiasis without cholecystitis.   Electronically Signed   By: Genevive Bi M.D.   On: 12/03/2012 20:30     Disposition and Follow-up: Discharge Orders   Future Appointments Provider  Department Dept Phone   12/20/2012 10:00 AM Tonny Bollman, MD [facility] Office  [phone]   01/13/2013 10:00 AM [facility] Office [phone]   Future Orders Complete By Expires   Diet - low sodium heart healthy  As directed    Increase activity slowly  As directed        DISPOSITION: Home  DIET: Heart healthy diet  ACTIVITY as tolerated   DISCHARGE FOLLOW-UP Follow-up Information   Follow up with Tonny Bollman, MD On 12/20/2012. (at 10:00 am)    Specialty:  Cardiology   Contact information:   [address] [place] [address] [phone]       Follow up with Mariella Saa, MD In 1 week. (for hospital follow-up and drain removal)    Specialty:  General Surgery   Contact information:   [address] [place] [address] [phone]       Time spent on Discharge: 40 mins  Signed:   Micaila Ziemba M.D. [facility] 12/07/2012, 11:43 AM Pager: [phone]

## 2012-12-07 NOTE — Progress Notes (Signed)
ANTICOAGULATION CONSULT NOTE - Follow Up Consult  Pharmacy Consult for Coumadin Indication: atrial fibrillation  No Known Allergies  Patient Measurements: Height: 5\' 9"  (175.3 cm) Weight: 191 lb 4.8 oz (86.773 kg) IBW/kg (Calculated) : 70.7  Vital Signs: Temp: 98.7 F (37.1 C) (10/25 0530) Temp src: Oral (10/25 0530) BP: 148/76 mmHg (10/25 0530) Pulse Rate: 71 (10/25 0530)  Labs:  Recent Labs  12/04/12 1049  12/05/12 0220 12/05/12 2155 12/06/12 0615 12/07/12 0352  HGB  --   < > 15.4  --  15.1 14.3  HCT  --   --  45.3  --  45.0 41.5  PLT  --   --  115*  --  115* 130*  LABPROT  --   < > 22.3* 17.8* 17.1* 14.9  INR  --   < > 2.03* 1.51* 1.43 1.20  HEPARINUNFRC  --   --  0.14*  --   --   --   CREATININE  --   --  0.77  --  0.94 0.90  TROPONINI <0.30  --   --   --   --   --   < > = values in this interval not displayed.  Estimated Creatinine Clearance: 66.6 ml/min (by C-G formula based on Cr of 0.9).   Medications:  Prescriptions prior to admission  Medication Sig Dispense Refill  . atorvastatin (LIPITOR) 20 MG tablet Take 20 mg by mouth every evening.      . Cholecalciferol (VITAMIN D-3) 5000 UNITS TABS Take 5,000 Units by mouth daily.       . Coenzyme Q10 (CO Q 10) 100 MG CAPS Take 1 capsule by mouth 2 (two) times daily.      Marland Kitchen ezetimibe (ZETIA) 10 MG tablet Take 10 mg by mouth every evening.      . hydrochlorothiazide (MICROZIDE) 12.5 MG capsule Take 1 capsule (12.5 mg total) by mouth daily before breakfast.  30 capsule  6  . ibuprofen (ADVIL,MOTRIN) 200 MG tablet Take 400 mg by mouth every 6 (six) hours as needed for pain.      . isosorbide mononitrate (IMDUR) 60 MG 24 hr tablet Take 1 tablet (60 mg total) by mouth daily.  30 tablet  4  . LORazepam (ATIVAN) 1 MG tablet Take 0.5-1 mg by mouth every 8 (eight) hours as needed for anxiety.       . metoprolol succinate (TOPROL-XL) 50 MG 24 hr tablet Take 1 tablet (50 mg total) by mouth 2 (two) times daily. Take with or  immediately following a meal.  60 tablet  3  . Multiple Vitamins-Minerals (CENTRUM SILVER PO) Take 1 tablet by mouth daily.      . Multiple Vitamins-Minerals (OCUVITE PRESERVISION PO) Take 1 tablet by mouth daily.      . nitroGLYCERIN (NITROSTAT) 0.4 MG SL tablet Place 1 tablet (0.4 mg total) under the tongue every 5 (five) minutes x 3 doses as needed for chest pain.  30 tablet  4  . omega-3 acid ethyl esters (LOVAZA) 1 G capsule Take 1 g by mouth daily.      . Psyllium (METAMUCIL) 30.9 % POWD Take 1 packet by mouth 3 times/day as needed-between meals & bedtime.       . ramipril (ALTACE) 10 MG capsule Take 10 mg by mouth 2 (two) times daily.      . Ranibizumab (LUCENTIS) 0.5 MG/0.05ML SOLN Inject 0.5 mg into the eye See admin instructions. Pt gets this injection every 9 weeks. Next treatment is  due on 01-18-12. Pt is followed by [facility].  Gets 1 shot at a time, one week apart.      . warfarin (COUMADIN) 5 MG tablet Take 5-7.5 mg by mouth See admin instructions. Takes 7.5 mg (1.5 tablets) on mondays and 5 mg (1 tablet) all other days        Assessment: 79 yo M on Coumadin PTA for hx afib.  Coumadin was reversed for lap-chole 10/23.  Pt received a total of 15 mg SQ Vit K and FFP for reversal.  INR subtherapeutic and continuing to decrease as expected.  Anticipate pt will demonstrate Coumadin resistance for a few days due to high doses of Vitamin K.  Will not give higher doses to overcome resistance at this time due to recent surgery and drain still in place.  Also noted patient on Cipro x 10 days which can interact with Coumadin and elevate INR.  INR 1.43>1.20, H/H slightly decreased but still wnl, no noted s/s bleeding  Home Coumadin dose = 5mg  daily except 7.5mg  on Mondays.  Goal of Therapy:  INR 2-3   Plan:  Coumadin 7.5mg  PO x 1 tonight Daily INR and CBC Monitor for s/s bleeding  Margie Billet, PharmD Clinical Pharmacist - Resident Pager: [phone] Pharmacy:  [phone] 12/07/2012 10:24 AM

## 2012-12-07 NOTE — Progress Notes (Signed)
2 Days Post-Op  Subjective: Feels well, now voiding well, passing some flatus, tol diet  Objective: Vital signs in last 24 hours: Temp:  [97.6 F (36.4 C)-98.7 F (37.1 C)] 98.7 F (37.1 C) (10/25 0530) Pulse Rate:  [71-80] 71 (10/25 0530) Resp:  [16-18] 16 (10/25 0530) BP: (134-152)/(64-79) 148/76 mmHg (10/25 0530) SpO2:  [93 %-95 %] 93 % (10/25 0530) Last BM Date: 12/05/12 (per pt)  Intake/Output from previous day: 10/24 0701 - 10/25 0700 In: 363 [P.O.:360; I.V.:3] Out: 1225 [Urine:1100; Drains:125] Intake/Output this shift: Total I/O In: 240 [P.O.:240] Out: 150 [Urine:150]  GI: soft approp tender wounds clean, drain with some serosang fluid  Lab Results:   Recent Labs  12/06/12 0615 12/07/12 0352  WBC 11.1* 8.2  HGB 15.1 14.3  HCT 45.0 41.5  PLT 115* 130*   BMET  Recent Labs  12/06/12 0615 12/07/12 0352  NA 132* 129*  K 4.3 4.1  CL 94* 95*  CO2 27 25  GLUCOSE 94 92  BUN 19 19  CREATININE 0.94 0.90  CALCIUM 8.8 8.4   PT/INR  Recent Labs  12/06/12 0615 12/07/12 0352  LABPROT 17.1* 14.9  INR 1.43 1.20   ABG No results found for this basename: PHART, PCO2, PO2, HCO3,  in the last 72 hours  Studies/Results: No results found.  Anti-infectives: Anti-infectives   Start     Dose/Rate Route Frequency Ordered Stop   12/07/12 0000  ciprofloxacin (CIPRO) 500 MG tablet     500 mg Oral 2 times daily 12/07/12 4098     12/06/12 1030  ciprofloxacin (CIPRO) tablet 500 mg     500 mg Oral 2 times daily 12/06/12 0924     12/05/12 0600  [MAR Hold]  ciprofloxacin (CIPRO) IVPB 400 mg     (On MAR Hold since 12/05/12 1528)  Comments:  Pharmacy may adjust dosing strength, interval, or rate of medication as needed for optimal therapy for the patient Send with patient on call to the OR.  Anesthesia to complete antibiotic administration <26min prior to incision per [facility].   400 mg 200 mL/hr over 60 Minutes Intravenous On call to O.R. 12/04/12 1624 12/05/12  1625      Assessment/Plan: Pod 2 lap chole  Can go home on 5-7 days abx total with drain in place from my standpoint Office will call for drain removal   Dellis Voght 12/07/2012

## 2012-12-07 NOTE — Progress Notes (Addendum)
       Patient Name: Tyler Williams Date of Encounter: 12/07/2012    SUBJECTIVE: Going home today. No cardiac complaints.  TELEMETRY:  A. fib with rate control: Filed Vitals:   12/06/12 1043 12/06/12 1342 12/06/12 2158 12/07/12 0530  BP: 164/88 134/64 152/79 148/76  Pulse: 70 74 80 71  Temp:  97.6 F (36.4 C) 98.4 F (36.9 C) 98.7 F (37.1 C)  TempSrc:   Oral Oral  Resp:  18 18 16   Height:      Weight:      SpO2:  94% 95% 93%    Intake/Output Summary (Last 24 hours) at 12/07/12 1130 Last data filed at 12/07/12 0900  Gross per 24 hour  Intake    600 ml  Output    940 ml  Net   -340 ml    LABS: Basic Metabolic Panel:  Recent Labs  16/10/96 0615 12/07/12 0352  NA 132* 129*  K 4.3 4.1  CL 94* 95*  CO2 27 25  GLUCOSE 94 92  BUN 19 19  CREATININE 0.94 0.90  CALCIUM 8.8 8.4   CBC:  Recent Labs  12/06/12 0615 12/07/12 0352  WBC 11.1* 8.2  HGB 15.1 14.3  HCT 45.0 41.5  MCV 94.9 93.5  PLT 115* 130*   Cardiac Enzymes: No results found for this basename: CKTOTAL, CKMB, CKMBINDEX, TROPONINI,  in the last 72 hours BNP: No components found with this basename: POCBNP,  Hemoglobin A1C: No results found for this basename: HGBA1C,  in the last 72 hours Fasting Lipid Panel: No results found for this basename: CHOL, HDL, LDLCALC, TRIG, CHOLHDL, LDLDIRECT,  in the last 72 hours  Radiology/Studies:  Not applicable  Physical Exam: Blood pressure 148/76, pulse 71, temperature 98.7 F (37.1 C), temperature source Oral, resp. rate 16, height 5\' 9"  (1.753 m), weight 191 lb 4.8 oz (86.773 kg), SpO2 93.00%. Weight change:    Chest clear  Irregularly irregular rhythm  Mild abdominal tenderness but good bowel sounds  ASSESSMENT:  1. Chronic atrial fibrillation  2. Coronary artery disease, asymptomatic  3. Chronic anticoagulation with Coumadin for atrial fibrillation  Plan:  We will resume maintenance Coumadin therapy. The patient informed he needs to  follow up in Coumadin clinic in 5-7 days.  He is being discharged today.  Tyler Williams 12/07/2012, 11:30 AM

## 2012-12-09 ENCOUNTER — Telehealth (INDEPENDENT_AMBULATORY_CARE_PROVIDER_SITE_OTHER): Payer: Self-pay

## 2012-12-09 NOTE — Telephone Encounter (Signed)
Called and spoke patient to give appointment for drain removal.  Patient reports doing well since surgery. Patient denies having any fever, nausea/vomiting.  Appointment for drain removal scheduled for 12/11/12 @ 10:10 am.

## 2012-12-09 NOTE — Telephone Encounter (Signed)
Message copied by Maryan Puls on Mon Dec 09, 2012  9:54 AM ------      Message from: Glenna Fellows T      Created: Mon Dec 09, 2012  7:42 AM       Needs appt for drain removal this week      ----- Message -----         From: Emelia Loron, MD         Sent: 12/07/2012  11:25 AM           To: Mariella Saa, MD            Romeo Apple      He should be going home Saturday.  Didn't know how you wanted drain treated.  He will go home with drain and I told him you or your nurse would call him.      Matt       ------

## 2012-12-10 ENCOUNTER — Other Ambulatory Visit: Payer: Self-pay | Admitting: Cardiology

## 2012-12-11 ENCOUNTER — Ambulatory Visit (INDEPENDENT_AMBULATORY_CARE_PROVIDER_SITE_OTHER): Payer: Medicare Other | Admitting: General Surgery

## 2012-12-11 ENCOUNTER — Ambulatory Visit (INDEPENDENT_AMBULATORY_CARE_PROVIDER_SITE_OTHER): Payer: Medicare Other | Admitting: Pharmacist

## 2012-12-11 DIAGNOSIS — I4891 Unspecified atrial fibrillation: Secondary | ICD-10-CM | POA: Diagnosis not present

## 2012-12-11 DIAGNOSIS — Z4889 Encounter for other specified surgical aftercare: Secondary | ICD-10-CM

## 2012-12-11 DIAGNOSIS — Z4803 Encounter for change or removal of drains: Secondary | ICD-10-CM

## 2012-12-11 LAB — POCT INR: INR: 2.2

## 2012-12-11 NOTE — Progress Notes (Signed)
Patient comes in status post lap chole on 12/05/12 with chole drain in place. He has had no pain and no fevers. He is eating well and has had no nausea or vomiting. He states he is feeling well overall. His drain has decreased in drainage significantly since discharge from the hospital. He has not kept a record on the drain amount, but his drain today has about 10 cc and he states he empties this amount 2-3 times a day. The drainage is serosanguinous. The drainage has never been green in color. Suction released and drain removed today. Area covered with triple antibiotic ointment/dry gauze. Patient instructed to call if he develops pain, swelling, increased drainage, or fevers. Appt made for follow up with Dr Johna Sheriff and he will call with any problems.

## 2012-12-11 NOTE — Patient Instructions (Signed)
Keep appt with Dr Johna Sheriff. Call with any increased pain, redness on skin, fevers, increased drainage or green drainage from drain site.

## 2012-12-12 ENCOUNTER — Encounter (INDEPENDENT_AMBULATORY_CARE_PROVIDER_SITE_OTHER): Payer: Medicare Other

## 2012-12-12 DIAGNOSIS — H353290 Exudative age-related macular degeneration, unspecified eye, stage unspecified: Secondary | ICD-10-CM | POA: Diagnosis not present

## 2012-12-12 DIAGNOSIS — H35059 Retinal neovascularization, unspecified, unspecified eye: Secondary | ICD-10-CM | POA: Diagnosis not present

## 2012-12-12 DIAGNOSIS — Z961 Presence of intraocular lens: Secondary | ICD-10-CM | POA: Diagnosis not present

## 2012-12-12 DIAGNOSIS — H35359 Cystoid macular degeneration, unspecified eye: Secondary | ICD-10-CM | POA: Diagnosis not present

## 2012-12-19 ENCOUNTER — Other Ambulatory Visit: Payer: Self-pay

## 2012-12-20 ENCOUNTER — Ambulatory Visit (INDEPENDENT_AMBULATORY_CARE_PROVIDER_SITE_OTHER): Payer: Medicare Other | Admitting: Cardiovascular Disease

## 2012-12-20 ENCOUNTER — Ambulatory Visit (INDEPENDENT_AMBULATORY_CARE_PROVIDER_SITE_OTHER): Payer: Medicare Other | Admitting: *Deleted

## 2012-12-20 ENCOUNTER — Encounter: Payer: Self-pay | Admitting: Cardiovascular Disease

## 2012-12-20 VITALS — BP 134/68 | HR 80 | Ht 69.5 in | Wt 191.1 lb

## 2012-12-20 DIAGNOSIS — I4891 Unspecified atrial fibrillation: Secondary | ICD-10-CM | POA: Diagnosis not present

## 2012-12-20 DIAGNOSIS — I2510 Atherosclerotic heart disease of native coronary artery without angina pectoris: Secondary | ICD-10-CM

## 2012-12-20 DIAGNOSIS — I359 Nonrheumatic aortic valve disorder, unspecified: Secondary | ICD-10-CM

## 2012-12-20 LAB — POCT INR: INR: 2.7

## 2012-12-20 NOTE — Patient Instructions (Signed)
Your physician wants you to follow-up in: 6 MONTHS with Dr Cooper.  You will receive a reminder letter in the mail two months in advance. If you don't receive a letter, please call our office to schedule the follow-up appointment.  Your physician has requested that you have an echocardiogram in 6 MONTHS. Echocardiography is a painless test that uses sound waves to create images of your heart. It provides your doctor with information about the size and shape of your heart and how well your heart's chambers and valves are working. This procedure takes approximately one hour. There are no restrictions for this procedure.  Your physician recommends that you continue on your current medications as directed. Please refer to the Current Medication list given to you today.   

## 2012-12-20 NOTE — Progress Notes (Signed)
HPI:  79 year old gentleman presenting for followup evaluation. The patient has been followed for atrial fibrillation, coronary artery disease, and mild aortic stenosis. He has undergone remote PTCA in the 1980s and 90s. PCI was attempted earlier this year as he was found to have critical stenosis of the right coronary artery. This was unsuccessful. He has been managed medically.  The patient was hospitalized October 21 through October 25 with acute gangrenous cholecystitis. He underwent laparoscopic cholecystectomy. He has had a good recovery. He is back to doing some walking. He has not played golf yet and is waiting until he sees Dr. Johna Sheriff to be released for more vigorous activities.  He feels well. He denies chest pain, chest pressure, or shortness of breath. He's had no lightheadedness or syncope.   Outpatient Encounter Prescriptions as of 12/20/2012  Medication Sig  . atorvastatin (LIPITOR) 20 MG tablet TAKE 1 TABLET ONCE DAILY.  Marland Kitchen Cholecalciferol (VITAMIN D-3) 5000 UNITS TABS Take 5,000 Units by mouth daily.   . Coenzyme Q10 (CO Q 10) 100 MG CAPS Take 1 capsule by mouth every morning.   . docusate sodium 100 MG CAPS Take 200 mg by mouth 2 (two) times daily.  Marland Kitchen ezetimibe (ZETIA) 10 MG tablet Take 10 mg by mouth every evening.  . hydrochlorothiazide (MICROZIDE) 12.5 MG capsule Take 1 capsule (12.5 mg total) by mouth daily before breakfast.  . ibuprofen (ADVIL,MOTRIN) 200 MG tablet Take 400 mg by mouth every 6 (six) hours as needed for pain.  . isosorbide mononitrate (IMDUR) 60 MG 24 hr tablet Take 1 tablet (60 mg total) by mouth daily.  Marland Kitchen LORazepam (ATIVAN) 1 MG tablet Take 1 mg by mouth at bedtime.   . metoprolol succinate (TOPROL-XL) 50 MG 24 hr tablet Take 1 tablet (50 mg total) by mouth 2 (two) times daily. Take with or immediately following a meal.  . Multiple Vitamins-Minerals (CENTRUM SILVER PO) Take 1 tablet by mouth daily.  . Multiple Vitamins-Minerals (OCUVITE PRESERVISION  PO) Take 1 tablet by mouth daily.  . nitroGLYCERIN (NITROSTAT) 0.4 MG SL tablet Place 1 tablet (0.4 mg total) under the tongue every 5 (five) minutes x 3 doses as needed for chest pain.  Marland Kitchen omega-3 acid ethyl esters (LOVAZA) 1 G capsule Take 1 g by mouth daily.  . Psyllium (METAMUCIL) 30.9 % POWD Take 1 packet by mouth as needed.   . ramipril (ALTACE) 10 MG capsule Take 10 mg by mouth 2 (two) times daily.  . Ranibizumab (LUCENTIS) 0.5 MG/0.05ML SOLN Inject 0.5 mg into the eye See admin instructions. Pt gets this injection every 9 weeks. Next treatment is due on 01-18-12. Pt is followed by [facility].  Gets 1 shot at a time, one week apart.  . warfarin (COUMADIN) 5 MG tablet Take 5-7.5 mg by mouth See admin instructions. Takes 7.5 mg (1.5 tablets) on mondays and 5 mg (1 tablet) all other days  . [DISCONTINUED] oxyCODONE-acetaminophen (PERCOCET/ROXICET) 5-325 MG per tablet Take 1 tablet by mouth every 6 (six) hours as needed.  . [DISCONTINUED] ciprofloxacin (CIPRO) 500 MG tablet Take 1 tablet (500 mg total) by mouth 2 (two) times daily. X 7 days    No Known Allergies  Past Medical History  Diagnosis Date  . CAD (coronary artery disease)     a. s/p multiple percutaneous prior coronary artery interventions b. 05/2012: unsuccessful PCI to tight RCA->medically managed  . Permanent atrial fibrillation   . PVD (peripheral vascular disease)     Bilateral renal  artery atherosclerosis, SMA stenosis with collateralization  . Hypercholesteremia   . SSS (sick sinus syndrome)   . Hypertension   . GERD (gastroesophageal reflux disease)   . History of skin cancer   . Arthritis     knees  . History of pulmonary embolism     1964 related to dislocated hip on right   . Chronic anticoagulation     Coumadin  . Aortic stenosis, mild     a. mean gradient 17 mmHg on 04/2012 TTE  . Hypertension   . Microscopic hematuria     Followed by urology  . RBBB     Chronic  . AAA (abdominal aortic  aneurysm)     s/p repair  . GERD (gastroesophageal reflux disease)   . Carotid artery disease     0-39% bilateral ICA stenoses 11/2011    ROS: Long-standing episodic leg swelling, otherwise negative except as per HPI  BP 134/68  Pulse 80  Ht 5' 9.5" (1.765 m)  Wt 191 lb 1.9 oz (86.691 kg)  BMI 27.83 kg/m2  PHYSICAL EXAM: Pt is alert and oriented, NAD HEENT: normal Neck: JVP - normal, carotids 2+= without bruits Lungs: CTA bilaterally CV: RRR with grade 2/6 systolic murmur at the right upper sternal border Abd: soft, NT, Positive BS, no hepatomegaly Ext: Trace edema on the right lower extremity, none on the left, distal pulses intact and equal Skin: warm/dry no rash  ASSESSMENT AND PLAN: 1. Coronary artery disease, native vessel. The patient is stable without anginal symptoms. He got through surgery without any ischemic events. He will continue on his same medical therapy and I will see him back in 6 months for followup.  2. Mild aortic stenosis. He will have a repeat echocardiogram before his followup visit. He has no symptoms related to this at the present time.  3. Atrial fibrillation. His heart rate is well controlled and he is tolerating long-term anticoagulation with warfarin.  Tyler Williams 12/20/2012 10:48 AM

## 2012-12-25 DIAGNOSIS — R3129 Other microscopic hematuria: Secondary | ICD-10-CM | POA: Diagnosis not present

## 2012-12-25 DIAGNOSIS — N529 Male erectile dysfunction, unspecified: Secondary | ICD-10-CM | POA: Diagnosis not present

## 2012-12-25 DIAGNOSIS — N400 Enlarged prostate without lower urinary tract symptoms: Secondary | ICD-10-CM | POA: Diagnosis not present

## 2012-12-27 ENCOUNTER — Encounter (INDEPENDENT_AMBULATORY_CARE_PROVIDER_SITE_OTHER): Payer: Self-pay | Admitting: General Surgery

## 2012-12-27 ENCOUNTER — Ambulatory Visit (INDEPENDENT_AMBULATORY_CARE_PROVIDER_SITE_OTHER): Payer: Medicare Other | Admitting: General Surgery

## 2012-12-27 VITALS — BP 120/78 | HR 66 | Temp 97.0°F | Resp 14 | Ht 69.5 in | Wt 194.0 lb

## 2012-12-27 DIAGNOSIS — Z09 Encounter for follow-up examination after completed treatment for conditions other than malignant neoplasm: Secondary | ICD-10-CM

## 2012-12-27 NOTE — Progress Notes (Signed)
History: Patient returns following emergency laparoscopic cholecystectomy for severe acute cholecystitis. His drain was removed last week. He reports he is doing very well. He denies any abdominal discomfort, nausea, fever or other complaint.  Exam: BP 120/78  Pulse 66  Temp(Src) 97 F (36.1 C) (Temporal)  Resp 14  Ht 5' 9.5" (1.765 m)  Wt 194 lb (87.998 kg)  BMI 28.25 kg/m2 General: Appears well Abdomen: Soft and nontender. Wounds all well healed  Pathology: showed acute suppurative cholecystitis.  Assessment and plan: doing well following laparoscopic cholecystectomy without apparent complication. He is released to full activity. Return as needed.

## 2013-01-02 DIAGNOSIS — H353290 Exudative age-related macular degeneration, unspecified eye, stage unspecified: Secondary | ICD-10-CM | POA: Diagnosis not present

## 2013-01-10 ENCOUNTER — Ambulatory Visit (INDEPENDENT_AMBULATORY_CARE_PROVIDER_SITE_OTHER): Payer: Medicare Other | Admitting: *Deleted

## 2013-01-10 DIAGNOSIS — I4891 Unspecified atrial fibrillation: Secondary | ICD-10-CM

## 2013-01-10 LAB — POCT INR: INR: 1.8

## 2013-01-30 DIAGNOSIS — Z961 Presence of intraocular lens: Secondary | ICD-10-CM | POA: Diagnosis not present

## 2013-01-30 DIAGNOSIS — H353290 Exudative age-related macular degeneration, unspecified eye, stage unspecified: Secondary | ICD-10-CM | POA: Diagnosis not present

## 2013-01-30 DIAGNOSIS — H35359 Cystoid macular degeneration, unspecified eye: Secondary | ICD-10-CM | POA: Diagnosis not present

## 2013-01-30 DIAGNOSIS — H35059 Retinal neovascularization, unspecified, unspecified eye: Secondary | ICD-10-CM | POA: Diagnosis not present

## 2013-01-31 ENCOUNTER — Ambulatory Visit (INDEPENDENT_AMBULATORY_CARE_PROVIDER_SITE_OTHER): Payer: Medicare Other | Admitting: *Deleted

## 2013-01-31 DIAGNOSIS — I4891 Unspecified atrial fibrillation: Secondary | ICD-10-CM

## 2013-01-31 LAB — POCT INR: INR: 2.9

## 2013-02-19 DIAGNOSIS — M25569 Pain in unspecified knee: Secondary | ICD-10-CM | POA: Diagnosis not present

## 2013-02-19 DIAGNOSIS — Z96659 Presence of unspecified artificial knee joint: Secondary | ICD-10-CM | POA: Diagnosis not present

## 2013-02-21 DIAGNOSIS — H6120 Impacted cerumen, unspecified ear: Secondary | ICD-10-CM | POA: Diagnosis not present

## 2013-02-21 DIAGNOSIS — H9209 Otalgia, unspecified ear: Secondary | ICD-10-CM | POA: Diagnosis not present

## 2013-02-28 ENCOUNTER — Ambulatory Visit (INDEPENDENT_AMBULATORY_CARE_PROVIDER_SITE_OTHER): Payer: Medicare Other | Admitting: *Deleted

## 2013-02-28 DIAGNOSIS — I4891 Unspecified atrial fibrillation: Secondary | ICD-10-CM | POA: Diagnosis not present

## 2013-02-28 LAB — POCT INR: INR: 2.1

## 2013-03-01 ENCOUNTER — Other Ambulatory Visit: Payer: Self-pay | Admitting: Cardiovascular Disease

## 2013-03-06 DIAGNOSIS — H353290 Exudative age-related macular degeneration, unspecified eye, stage unspecified: Secondary | ICD-10-CM | POA: Diagnosis not present

## 2013-03-14 ENCOUNTER — Other Ambulatory Visit: Payer: Self-pay | Admitting: *Deleted

## 2013-03-14 MED ORDER — ATORVASTATIN CALCIUM 20 MG PO TABS: ORAL_TABLET | ORAL | Status: DC

## 2013-03-19 DIAGNOSIS — H3530 Unspecified macular degeneration: Secondary | ICD-10-CM | POA: Diagnosis not present

## 2013-03-19 DIAGNOSIS — Z961 Presence of intraocular lens: Secondary | ICD-10-CM | POA: Insufficient documentation

## 2013-03-19 DIAGNOSIS — H3540 Unspecified peripheral retinal degeneration: Secondary | ICD-10-CM | POA: Diagnosis not present

## 2013-03-19 DIAGNOSIS — H35379 Puckering of macula, unspecified eye: Secondary | ICD-10-CM | POA: Diagnosis not present

## 2013-03-19 DIAGNOSIS — H35439 Paving stone degeneration of retina, unspecified eye: Secondary | ICD-10-CM | POA: Diagnosis not present

## 2013-03-19 DIAGNOSIS — H353290 Exudative age-related macular degeneration, unspecified eye, stage unspecified: Secondary | ICD-10-CM | POA: Diagnosis not present

## 2013-03-20 ENCOUNTER — Ambulatory Visit (INDEPENDENT_AMBULATORY_CARE_PROVIDER_SITE_OTHER): Payer: Medicare Other | Admitting: *Deleted

## 2013-03-20 DIAGNOSIS — Z5181 Encounter for therapeutic drug level monitoring: Secondary | ICD-10-CM

## 2013-03-20 DIAGNOSIS — I4891 Unspecified atrial fibrillation: Secondary | ICD-10-CM

## 2013-03-20 LAB — POCT INR: INR: 2.3

## 2013-03-21 ENCOUNTER — Other Ambulatory Visit: Payer: Self-pay | Admitting: Cardiovascular Disease

## 2013-04-07 ENCOUNTER — Other Ambulatory Visit: Payer: Self-pay | Admitting: Pharmacist

## 2013-04-07 MED ORDER — WARFARIN SODIUM 5 MG PO TABS: ORAL_TABLET | ORAL | Status: DC

## 2013-04-12 DIAGNOSIS — H353290 Exudative age-related macular degeneration, unspecified eye, stage unspecified: Secondary | ICD-10-CM | POA: Diagnosis not present

## 2013-04-12 DIAGNOSIS — Z961 Presence of intraocular lens: Secondary | ICD-10-CM | POA: Diagnosis not present

## 2013-04-22 ENCOUNTER — Other Ambulatory Visit: Payer: Self-pay | Admitting: Cardiovascular Disease

## 2013-04-24 DIAGNOSIS — D485 Neoplasm of uncertain behavior of skin: Secondary | ICD-10-CM | POA: Diagnosis not present

## 2013-04-24 DIAGNOSIS — L821 Other seborrheic keratosis: Secondary | ICD-10-CM | POA: Diagnosis not present

## 2013-04-24 DIAGNOSIS — L570 Actinic keratosis: Secondary | ICD-10-CM | POA: Diagnosis not present

## 2013-04-24 DIAGNOSIS — Z808 Family history of malignant neoplasm of other organs or systems: Secondary | ICD-10-CM | POA: Diagnosis not present

## 2013-04-24 DIAGNOSIS — D239 Other benign neoplasm of skin, unspecified: Secondary | ICD-10-CM | POA: Diagnosis not present

## 2013-04-25 DIAGNOSIS — C44519 Basal cell carcinoma of skin of other part of trunk: Secondary | ICD-10-CM | POA: Diagnosis not present

## 2013-04-25 DIAGNOSIS — D0460 Carcinoma in situ of skin of unspecified upper limb, including shoulder: Secondary | ICD-10-CM | POA: Diagnosis not present

## 2013-05-01 ENCOUNTER — Ambulatory Visit (INDEPENDENT_AMBULATORY_CARE_PROVIDER_SITE_OTHER): Payer: Medicare Other | Admitting: *Deleted

## 2013-05-01 DIAGNOSIS — Z5181 Encounter for therapeutic drug level monitoring: Secondary | ICD-10-CM | POA: Diagnosis not present

## 2013-05-01 DIAGNOSIS — I4891 Unspecified atrial fibrillation: Secondary | ICD-10-CM | POA: Diagnosis not present

## 2013-05-01 DIAGNOSIS — H353290 Exudative age-related macular degeneration, unspecified eye, stage unspecified: Secondary | ICD-10-CM | POA: Diagnosis not present

## 2013-05-01 LAB — POCT INR: INR: 1.8

## 2013-05-07 DIAGNOSIS — C44519 Basal cell carcinoma of skin of other part of trunk: Secondary | ICD-10-CM | POA: Diagnosis not present

## 2013-05-07 DIAGNOSIS — D0460 Carcinoma in situ of skin of unspecified upper limb, including shoulder: Secondary | ICD-10-CM | POA: Diagnosis not present

## 2013-05-07 DIAGNOSIS — L570 Actinic keratosis: Secondary | ICD-10-CM | POA: Diagnosis not present

## 2013-05-15 DIAGNOSIS — H353290 Exudative age-related macular degeneration, unspecified eye, stage unspecified: Secondary | ICD-10-CM | POA: Diagnosis not present

## 2013-05-21 ENCOUNTER — Telehealth: Payer: Self-pay | Admitting: Internal Medicine

## 2013-05-21 NOTE — Telephone Encounter (Signed)
Patient called to report symptomatic bradycardia. He reports several days of elevated blood pressure with SBP approximately 200. In order to treat this, he took one of his previously prescribed doses of verapamil this AM. He is currently on metoprolol XL 50 mg BID. Today, he has felt lightheaded, orthostatic and weak. Systolic blood pressures have been approximately 100, however his heart rate has been in the high 30s and low 40s per home readings. I recommended that the patient present to the Emergency Department for further care and evaluation. His wife is currently with him at home. He stated understanding but seemed reluctant to present to ED. I again recommended that he present for evaluation and explained to him that this is almost certainly due to interactions between these medications. Patient endorsed understanding.

## 2013-05-23 ENCOUNTER — Ambulatory Visit (INDEPENDENT_AMBULATORY_CARE_PROVIDER_SITE_OTHER): Payer: Medicare Other | Admitting: Cardiovascular Disease

## 2013-05-23 ENCOUNTER — Encounter: Payer: Self-pay | Admitting: Cardiovascular Disease

## 2013-05-23 VITALS — BP 130/60 | HR 46 | Ht 69.5 in | Wt 202.0 lb

## 2013-05-23 DIAGNOSIS — I359 Nonrheumatic aortic valve disorder, unspecified: Secondary | ICD-10-CM

## 2013-05-23 DIAGNOSIS — M79609 Pain in unspecified limb: Secondary | ICD-10-CM

## 2013-05-23 DIAGNOSIS — I4891 Unspecified atrial fibrillation: Secondary | ICD-10-CM

## 2013-05-23 DIAGNOSIS — M79606 Pain in leg, unspecified: Secondary | ICD-10-CM

## 2013-05-23 DIAGNOSIS — I2510 Atherosclerotic heart disease of native coronary artery without angina pectoris: Secondary | ICD-10-CM | POA: Diagnosis not present

## 2013-05-23 DIAGNOSIS — R42 Dizziness and giddiness: Secondary | ICD-10-CM

## 2013-05-23 DIAGNOSIS — I350 Nonrheumatic aortic (valve) stenosis: Secondary | ICD-10-CM

## 2013-05-23 MED ORDER — AMLODIPINE BESYLATE 5 MG PO TABS: 5.0000 mg | ORAL_TABLET | Freq: Every day | ORAL | Status: DC

## 2013-05-23 MED ORDER — METOPROLOL SUCCINATE ER 50 MG PO TB24: 50.0000 mg | ORAL_TABLET | Freq: Every day | ORAL | Status: DC

## 2013-05-23 NOTE — Patient Instructions (Addendum)
Your physician recommends that you schedule a follow-up appointment in: 2-3 weeks with PA/NP  Your physician has recommended you make the following change in your medication: DO NOT TAKE VERAPAMIL, DECREASE Metoprolol Succinate to once a day, START Amlodipine 5mg  once a day   Your physician has requested that you have an ankle brachial index (ABI). During this test an ultrasound and blood pressure cuff are used to evaluate the arteries that supply the arms and legs with blood. Allow thirty minutes for this exam. There are no restrictions or special instructions.

## 2013-05-23 NOTE — Progress Notes (Signed)
HPI:   80 year old gentleman presenting for followup evaluation. The patient has been followed for atrial fibrillation, coronary artery disease, and aortic stenosis. He has undergone remote PTCA in the 1980s and 90s. PCI was attempted in 2014 as he was found to have critical stenosis of the right coronary artery. This was unsuccessful and the patient has been managed medically.  Recently he has developed issues with hypertension and dizziness. He has noted very slow heart rates. He's been taking his regular antihypertensive medications which include hydrochlorothiazide, isosorbide, ramipril, and metoprolol succinate. In addition, he's been self-medicating with verapamil which he has from a previous prescription. He's noted heart rates as low as the 30s. He complains of fatigue and dizziness. No syncope or pre-syncope. Mild shortness of breath with activity, but he remains quite physically active with golfing and walking. No chest pain or pressure. He complains of swelling of the right leg.  Outpatient Encounter Prescriptions as of 05/23/2013  Medication Sig  . atorvastatin (LIPITOR) 20 MG tablet TAKE 1 TABLET ONCE DAILY.  Marland Kitchen Cholecalciferol (VITAMIN D-3) 5000 UNITS TABS Take 5,000 Units by mouth daily.   . Coenzyme Q10 (CO Q 10) 100 MG CAPS Take 1 capsule by mouth every morning.   . hydrochlorothiazide (MICROZIDE) 12.5 MG capsule Take 1 capsule (12.5 mg total) by mouth daily before breakfast.  . ibuprofen (ADVIL,MOTRIN) 200 MG tablet Take 400 mg by mouth every 6 (six) hours as needed for pain.  . isosorbide mononitrate (IMDUR) 60 MG 24 hr tablet TAKE 1 TABLET ONCE DAILY.  Marland Kitchen LORazepam (ATIVAN) 1 MG tablet Take 1 mg by mouth at bedtime.   . metoprolol succinate (TOPROL-XL) 50 MG 24 hr tablet TAKE (1) TABLET TWICE DAILY AFTER MEALS.  . Multiple Vitamins-Minerals (CENTRUM SILVER PO) Take 1 tablet by mouth daily.  . Multiple Vitamins-Minerals (OCUVITE PRESERVISION PO) Take 1 tablet by mouth daily.  .  nitroGLYCERIN (NITROSTAT) 0.4 MG SL tablet Place 1 tablet (0.4 mg total) under the tongue every 5 (five) minutes x 3 doses as needed for chest pain.  Marland Kitchen omega-3 acid ethyl esters (LOVAZA) 1 G capsule Take 1 g by mouth daily.  . Psyllium (METAMUCIL) 30.9 % POWD Take 1 packet by mouth as needed.   . ramipril (ALTACE) 10 MG capsule Take 10 mg by mouth 2 (two) times daily.  . Ranibizumab (LUCENTIS) 0.5 MG/0.05ML SOLN Inject 0.5 mg into the eye See admin instructions. Pt gets this injection every 9 weeks. Next treatment is due on 01-18-12. Pt is followed by [facility].  Gets 1 shot at a time, one week apart.  . warfarin (COUMADIN) 5 MG tablet Take 5 mg by mouth. Take as directed by Anticoagulation clinic  . ZETIA 10 MG tablet TAKE 1 TABLET IN THE P.M.  . [DISCONTINUED] warfarin (COUMADIN) 5 MG tablet Take as directed by Anticoagulation clinic  . [DISCONTINUED] docusate sodium 100 MG CAPS Take 200 mg by mouth 2 (two) times daily.    No Known Allergies  Past Medical History  Diagnosis Date  . CAD (coronary artery disease)     a. s/p multiple percutaneous prior coronary artery interventions b. 05/2012: unsuccessful PCI to tight RCA->medically managed  . Permanent atrial fibrillation   . PVD (peripheral vascular disease)     Bilateral renal artery atherosclerosis, SMA stenosis with collateralization  . Hypercholesteremia   . SSS (sick sinus syndrome)   . Hypertension   . GERD (gastroesophageal reflux disease)   . History of skin cancer   .  Arthritis     knees  . History of pulmonary embolism     1964 related to dislocated hip on right   . Chronic anticoagulation     Coumadin  . Aortic stenosis, mild     a. mean gradient 17 mmHg on 04/2012 TTE  . Hypertension   . Microscopic hematuria     Followed by urology  . RBBB     Chronic  . AAA (abdominal aortic aneurysm)     s/p repair  . GERD (gastroesophageal reflux disease)   . Carotid artery disease     0-39% bilateral ICA  stenoses 11/2011    ROS: Negative except as per HPI  BP 130/60  Pulse 46  Ht 5' 9.5" (1.765 m)  Wt 202 lb (91.627 kg)  BMI 29.41 kg/m2  PHYSICAL EXAM: Pt is alert and oriented, pleasant elderly male in NAD HEENT: normal Neck: JVP - normal, carotids 2+= with bilateral bruits Lungs: CTA bilaterally CV: Irregularly irregular with grade 2/6 harsh systolic murmur at the right upper sternal border Abd: soft, NT, Positive BS, no hepatomegaly Ext: 1+ edema on the right, trace edema on the left, distal pulses intact and equal Skin: warm/dry no rash  EKG:  Atrial fibrillation 46 beats per minute, right bundle branch block.  2-D echocardiogram March 2014: Study Conclusions  - Left ventricle: The cavity size was normal. Wall thickness was normal. Systolic function was vigorous. The estimated ejection fraction was in the range of 65% to 70%. - Aortic valve: AV is thickened, calcified with mildly restricted motion. Peak and mean gradients through the valve are 27 and 17 mm Hg respectively. - Mitral valve: Mild regurgitation. - Left atrium: The atrium was moderately dilated. - Pulmonary arteries: PA peak pressure: 34mm Hg (S).  ASSESSMENT AND PLAN: 1. Atrial fibrillation with slow ventricular response. This is exacerbated by AV nodal blocking agents with metoprolol succinate and verapamil. I advised that he completely stop taking verapamil. Will decrease his metoprolol succinate dose by 50% so that he takes 50 mg once daily. He has a scheduled followup visit with me in June. I would like him to come back for an APP visit in 2-3 weeks to check his heart rate.  2. HTN. Advised that he start amlodipine 5 mg daily. Other medicine changes as detailed above.  3. Aortic stenosis. He is scheduled for repeat echocardiogram in June before his office visit. On exam his aortic stenosis appears to be in the moderate range.  4. Coronary atherosclerosis, native vessel. He has known severe stenosis of  the RCA. He underwent unsuccessful PCI greater than one year ago. Fortunately he has no angina and we will continue with his medical therapy.  5. Right leg swelling and bilateral leg weakness. Will check ABIs on the comes in June for his echocardiogram.  Sherren Mocha 05/23/2013 3:59 PM

## 2013-05-29 ENCOUNTER — Ambulatory Visit (INDEPENDENT_AMBULATORY_CARE_PROVIDER_SITE_OTHER): Payer: Medicare Other | Admitting: Pharmacist Clinician (PhC)/ Clinical Pharmacy Specialist

## 2013-05-29 DIAGNOSIS — Z5181 Encounter for therapeutic drug level monitoring: Secondary | ICD-10-CM

## 2013-05-29 DIAGNOSIS — I4891 Unspecified atrial fibrillation: Secondary | ICD-10-CM

## 2013-05-29 LAB — POCT INR: INR: 2.2

## 2013-05-30 ENCOUNTER — Ambulatory Visit (HOSPITAL_COMMUNITY): Payer: Medicare Other | Attending: Cardiology | Admitting: *Deleted

## 2013-05-30 ENCOUNTER — Encounter: Payer: Self-pay | Admitting: Cardiology

## 2013-05-30 DIAGNOSIS — M79609 Pain in unspecified limb: Secondary | ICD-10-CM | POA: Insufficient documentation

## 2013-05-30 DIAGNOSIS — I739 Peripheral vascular disease, unspecified: Secondary | ICD-10-CM | POA: Insufficient documentation

## 2013-05-30 DIAGNOSIS — M79606 Pain in leg, unspecified: Secondary | ICD-10-CM

## 2013-05-30 NOTE — Progress Notes (Signed)
Lower extremity arterial duplex and ABI's complete.

## 2013-06-05 DIAGNOSIS — H353290 Exudative age-related macular degeneration, unspecified eye, stage unspecified: Secondary | ICD-10-CM | POA: Diagnosis not present

## 2013-06-09 ENCOUNTER — Other Ambulatory Visit: Payer: Self-pay | Admitting: Cardiovascular Disease

## 2013-06-10 ENCOUNTER — Other Ambulatory Visit (HOSPITAL_COMMUNITY): Payer: Medicare Other

## 2013-06-13 ENCOUNTER — Ambulatory Visit (INDEPENDENT_AMBULATORY_CARE_PROVIDER_SITE_OTHER): Payer: Medicare Other | Admitting: Physician Assistant

## 2013-06-13 ENCOUNTER — Encounter: Payer: Self-pay | Admitting: Physician Assistant

## 2013-06-13 VITALS — BP 130/70 | HR 70 | Ht 69.5 in | Wt 195.0 lb

## 2013-06-13 DIAGNOSIS — E7800 Pure hypercholesterolemia, unspecified: Secondary | ICD-10-CM

## 2013-06-13 DIAGNOSIS — I4891 Unspecified atrial fibrillation: Secondary | ICD-10-CM | POA: Diagnosis not present

## 2013-06-13 DIAGNOSIS — I2510 Atherosclerotic heart disease of native coronary artery without angina pectoris: Secondary | ICD-10-CM

## 2013-06-13 DIAGNOSIS — I350 Nonrheumatic aortic (valve) stenosis: Secondary | ICD-10-CM

## 2013-06-13 DIAGNOSIS — R609 Edema, unspecified: Secondary | ICD-10-CM

## 2013-06-13 DIAGNOSIS — I359 Nonrheumatic aortic valve disorder, unspecified: Secondary | ICD-10-CM | POA: Diagnosis not present

## 2013-06-13 DIAGNOSIS — I10 Essential (primary) hypertension: Secondary | ICD-10-CM

## 2013-06-13 NOTE — Patient Instructions (Signed)
Your physician recommends that you continue on your current medications as directed. Please refer to the Current Medication list given to you today.  Keep your appointment with Dr Burt Knack in June

## 2013-06-13 NOTE — Progress Notes (Signed)
Tyler Williams, [place], [place]  [id number] Phone: [phone] Fax:  [phone]  Date:  06/13/2013   ID:  Tyler Williams, DOB [birth year], MRN [id number]  PCP:  Geoffery Lyons, MD  Cardiologist:  Dr. Sherren Mocha      History of Present Illness: Tyler Williams is a 80 y.o. male with a hx of chronic atrial fibrillation on coumadin, CAD s/p PTCA in 1980s and 1990s, PAD, AAA repair, and AS.  He was admitted 05/2012 with [place]. LHC demonstrated mid RCA 99%.  PCI was attempted but unsuccessful and Med Rx was recommended.  Admitted 11/2012 with gangrenous cholecystitis. He underwent laparoscopic cholecystectomy.  He was last seen by Dr. Burt Knack 05/23/13. He was complaining of fatigue and dizziness. He was noted to have slow ventricular response with a heart rate of 46. Verapamil was discontinued. Metoprolol dose was cut in half. He was placed on amlodipine for blood pressure control and brought back today for close follow up.  He is feeling better. He denies any further dizziness. He denies syncope or near-syncope. He denies orthopnea or PND. He continues to complain of right greater than left lower extremity edema. This is better in the mornings and worse in the evenings after being on his feet. He denies chest pain, dyspnea.  Studies:  - LHC 05/24/12: pD1 20%, oCFX 20%, mCFX 30%, ostial Int Br 30%, distal AV groove CFX 30%, pRCA 30-40%, mRCA 99%, dRCA 30%, PL branch small with diffuse 40%. Attempt at PCI of the RCA was made. However, this was unsuccessful. Medical therapy was recommended.  - Echo 04/22/12: EF 65-70%, mild aortic stenosis, mean gradient 17, mild MR, moderate LAE, PASP 32.  - Carotid US (11/2011):  Bilateral ICA 0-39%-followup in 2 years   Recent Labs: 12/05/2012: ALT 21  12/07/2012: Creatinine 0.90; Hemoglobin 14.3; Potassium 4.1   Wt Readings from Last 3 Encounters:  06/13/13 195 lb (88.451 kg)  05/23/13 202 lb (91.627 kg)  12/27/12 194 lb (87.998 kg)     Past  Medical History  Diagnosis Date  . CAD (coronary artery disease)     a. s/p multiple percutaneous prior coronary artery interventions b. 05/2012: unsuccessful PCI to tight RCA->medically managed  . Permanent atrial fibrillation   . PVD (peripheral vascular disease)     Bilateral renal artery atherosclerosis, SMA stenosis with collateralization  . Hypercholesteremia   . SSS (sick sinus syndrome)   . Hypertension   . GERD (gastroesophageal reflux disease)   . History of skin cancer   . Arthritis     knees  . History of pulmonary embolism     1964 related to dislocated hip on right   . Chronic anticoagulation     Coumadin  . Aortic stenosis, mild     a. mean gradient 17 mmHg on 04/2012 TTE  . Hypertension   . Microscopic hematuria     Followed by urology  . RBBB     Chronic  . AAA (abdominal aortic aneurysm)     s/p repair  . GERD (gastroesophageal reflux disease)   . Carotid artery disease     0-39% bilateral ICA stenoses 11/2011    Current Outpatient Prescriptions  Medication Sig Dispense Refill  . amLODipine (NORVASC) 5 MG tablet Take 1 tablet (5 mg total) by mouth daily.  30 tablet  3  . atorvastatin (LIPITOR) 20 MG tablet TAKE 1 TABLET ONCE DAILY.  90 tablet  0  . Cholecalciferol (VITAMIN D-3) 5000 UNITS TABS Take  5,000 Units by mouth daily.       . Coenzyme Q10 (CO Q 10) 100 MG CAPS Take 1 capsule by mouth every morning.       . hydrochlorothiazide (MICROZIDE) 12.5 MG capsule Take 1 capsule (12.5 mg total) by mouth daily before breakfast.  30 capsule  6  . ibuprofen (ADVIL,MOTRIN) 200 MG tablet Take 400 mg by mouth every 6 (six) hours as needed for pain.      . isosorbide mononitrate (IMDUR) 60 MG 24 hr tablet TAKE 1 TABLET ONCE DAILY.  30 tablet  2  . LORazepam (ATIVAN) 1 MG tablet Take 1 mg by mouth at bedtime.       . metoprolol succinate (TOPROL-XL) 50 MG 24 hr tablet Take 1 tablet (50 mg total) by mouth daily. Take with or immediately following a meal.  30 tablet  3    . Multiple Vitamins-Minerals (CENTRUM SILVER PO) Take 1 tablet by mouth daily.      . Multiple Vitamins-Minerals (OCUVITE PRESERVISION PO) Take 1 tablet by mouth daily.      . nitroGLYCERIN (NITROSTAT) 0.4 MG SL tablet Place 1 tablet (0.4 mg total) under the tongue every 5 (five) minutes x 3 doses as needed for chest pain.  30 tablet  4  . omega-3 acid ethyl esters (LOVAZA) 1 G capsule Take 1 g by mouth daily.      . Psyllium (METAMUCIL) 30.9 % POWD Take 1 packet by mouth as needed.       . ramipril (ALTACE) 10 MG capsule Take 10 mg by mouth 2 (two) times daily.      . Ranibizumab (LUCENTIS) 0.5 MG/0.05ML SOLN Inject 0.5 mg into the eye See admin instructions. Pt gets this injection every 9 weeks. Next treatment is due on 01-18-12. Pt is followed by [facility].  Gets 1 shot at a time, one week apart.      . warfarin (COUMADIN) 5 MG tablet Take 5 mg by mouth. Take as directed by Anticoagulation clinic      . ZETIA 10 MG tablet TAKE 1 TABLET IN THE P.M.  30 tablet  1   No current facility-administered medications for this visit.    Allergies:   Review of patient's allergies indicates no known allergies.   Social History:  The patient  reports that he quit smoking about 30 years ago. His smoking use included Cigarettes. He smoked 0.00 packs per day. He has never used smokeless tobacco. He reports that he drinks about 4.2 ounces of alcohol per week. He reports that he does not use illicit drugs.   Family History:  The patient's family history includes Heart attack (age of onset: 28) in his mother.   ROS:  Please see the history of present illness.      All other systems reviewed and negative.   PHYSICAL EXAM: VS:  BP 130/70  Pulse 70  Ht 5' 9.5" (1.765 m)  Wt 195 lb (88.451 kg)  BMI 28.39 kg/m2 Well nourished, well developed, in no acute distress HEENT: normal Neck: no JVD Cardiac:  normal S1, S2; irregularly irregular rhythm;  2/6 crescendo decrescendo systolic murmur heard  best at the RUSB Lungs:  clear to auscultation bilaterally, no wheezing, rhonchi or rales Abd: soft, nontender, no hepatomegaly Ext: 1+ RLE and trace LLE edema Skin: warm and dry Neuro:  CNs 2-12 intact, no focal abnormalities noted  EKG:  Atrial fibrillation, HR 70, PVC     ASSESSMENT AND PLAN:  1. Atrial  fibrillation: Ventricular response is now normal. He feels better. Continue current therapy. 2. Coronary atherosclerosis of native coronary artery: No angina. He is not on aspirin as he is on Coumadin. Continue statin, Zetia, nitrates, beta blocker. 3. Aortic stenosis: Mild by echocardiogram in 2014. Repeat echocardiogram pending in June. 4. Essential hypertension, benign: Controlled. 5. Pure hypercholesterolemia: Continue statin. This is managed by primary care. 6. Edema:  This appears to be related to venous insufficiency. I have recommended compression stockings OTC. 7. Disposition: Follow up with Dr. Burt Knack as planned.  Signed, [name], PA-C  06/13/2013 10:23 AM

## 2013-06-16 ENCOUNTER — Other Ambulatory Visit: Payer: Self-pay | Admitting: Cardiovascular Disease

## 2013-06-19 DIAGNOSIS — H353290 Exudative age-related macular degeneration, unspecified eye, stage unspecified: Secondary | ICD-10-CM | POA: Diagnosis not present

## 2013-06-20 ENCOUNTER — Ambulatory Visit: Payer: Medicare Other | Admitting: Nurse Practitioner

## 2013-06-27 DIAGNOSIS — H353290 Exudative age-related macular degeneration, unspecified eye, stage unspecified: Secondary | ICD-10-CM | POA: Diagnosis not present

## 2013-06-28 ENCOUNTER — Other Ambulatory Visit: Payer: Self-pay | Admitting: Cardiovascular Disease

## 2013-07-08 DIAGNOSIS — H353290 Exudative age-related macular degeneration, unspecified eye, stage unspecified: Secondary | ICD-10-CM | POA: Diagnosis not present

## 2013-07-10 ENCOUNTER — Ambulatory Visit (INDEPENDENT_AMBULATORY_CARE_PROVIDER_SITE_OTHER): Payer: Medicare Other | Admitting: *Deleted

## 2013-07-10 DIAGNOSIS — I4891 Unspecified atrial fibrillation: Secondary | ICD-10-CM

## 2013-07-10 DIAGNOSIS — Z125 Encounter for screening for malignant neoplasm of prostate: Secondary | ICD-10-CM | POA: Diagnosis not present

## 2013-07-10 DIAGNOSIS — E785 Hyperlipidemia, unspecified: Secondary | ICD-10-CM | POA: Diagnosis not present

## 2013-07-10 DIAGNOSIS — I10 Essential (primary) hypertension: Secondary | ICD-10-CM | POA: Diagnosis not present

## 2013-07-10 DIAGNOSIS — Z5181 Encounter for therapeutic drug level monitoring: Secondary | ICD-10-CM

## 2013-07-10 LAB — POCT INR: INR: 2.2

## 2013-07-17 ENCOUNTER — Other Ambulatory Visit: Payer: Self-pay | Admitting: Cardiology

## 2013-07-17 ENCOUNTER — Ambulatory Visit (HOSPITAL_COMMUNITY): Payer: Medicare Other | Attending: Cardiology | Admitting: Radiology

## 2013-07-17 ENCOUNTER — Other Ambulatory Visit: Payer: Self-pay | Admitting: Cardiovascular Disease

## 2013-07-17 DIAGNOSIS — I517 Cardiomegaly: Secondary | ICD-10-CM | POA: Diagnosis not present

## 2013-07-17 DIAGNOSIS — Z87891 Personal history of nicotine dependence: Secondary | ICD-10-CM | POA: Diagnosis not present

## 2013-07-17 DIAGNOSIS — I359 Nonrheumatic aortic valve disorder, unspecified: Secondary | ICD-10-CM | POA: Diagnosis not present

## 2013-07-17 DIAGNOSIS — I059 Rheumatic mitral valve disease, unspecified: Secondary | ICD-10-CM | POA: Insufficient documentation

## 2013-07-17 DIAGNOSIS — R609 Edema, unspecified: Secondary | ICD-10-CM | POA: Insufficient documentation

## 2013-07-17 DIAGNOSIS — E785 Hyperlipidemia, unspecified: Secondary | ICD-10-CM | POA: Insufficient documentation

## 2013-07-17 DIAGNOSIS — Z6829 Body mass index (BMI) 29.0-29.9, adult: Secondary | ICD-10-CM | POA: Diagnosis not present

## 2013-07-17 DIAGNOSIS — I10 Essential (primary) hypertension: Secondary | ICD-10-CM | POA: Insufficient documentation

## 2013-07-17 DIAGNOSIS — Z23 Encounter for immunization: Secondary | ICD-10-CM | POA: Diagnosis not present

## 2013-07-17 DIAGNOSIS — Z0000 Encounter for general adult medical examination without abnormal findings: Secondary | ICD-10-CM | POA: Diagnosis not present

## 2013-07-17 DIAGNOSIS — M1990 Unspecified osteoarthritis, unspecified site: Secondary | ICD-10-CM | POA: Diagnosis not present

## 2013-07-17 DIAGNOSIS — I2510 Atherosclerotic heart disease of native coronary artery without angina pectoris: Secondary | ICD-10-CM

## 2013-07-17 DIAGNOSIS — R0989 Other specified symptoms and signs involving the circulatory and respiratory systems: Secondary | ICD-10-CM | POA: Insufficient documentation

## 2013-07-17 DIAGNOSIS — I4891 Unspecified atrial fibrillation: Secondary | ICD-10-CM

## 2013-07-17 DIAGNOSIS — Z1331 Encounter for screening for depression: Secondary | ICD-10-CM | POA: Diagnosis not present

## 2013-07-17 DIAGNOSIS — I739 Peripheral vascular disease, unspecified: Secondary | ICD-10-CM | POA: Diagnosis not present

## 2013-07-17 DIAGNOSIS — Z125 Encounter for screening for malignant neoplasm of prostate: Secondary | ICD-10-CM | POA: Diagnosis not present

## 2013-07-17 DIAGNOSIS — R079 Chest pain, unspecified: Secondary | ICD-10-CM | POA: Diagnosis not present

## 2013-07-17 NOTE — Progress Notes (Signed)
Echocardiogram performed.  

## 2013-07-24 ENCOUNTER — Encounter: Payer: Self-pay | Admitting: Cardiovascular Disease

## 2013-07-24 ENCOUNTER — Ambulatory Visit (INDEPENDENT_AMBULATORY_CARE_PROVIDER_SITE_OTHER): Payer: Medicare Other | Admitting: Cardiovascular Disease

## 2013-07-24 VITALS — BP 142/74 | HR 68 | Ht 69.5 in | Wt 197.0 lb

## 2013-07-24 DIAGNOSIS — I2510 Atherosclerotic heart disease of native coronary artery without angina pectoris: Secondary | ICD-10-CM | POA: Diagnosis not present

## 2013-07-24 DIAGNOSIS — I4891 Unspecified atrial fibrillation: Secondary | ICD-10-CM | POA: Diagnosis not present

## 2013-07-24 DIAGNOSIS — I359 Nonrheumatic aortic valve disorder, unspecified: Secondary | ICD-10-CM

## 2013-07-24 NOTE — Progress Notes (Signed)
HPI:  Tyler Williams is a 80 y.o. male with a hx of chronic atrial fibrillation on coumadin, CAD s/p PTCA in 1980s and 1990s, PAD, AAA repair, and AS. He was admitted 05/2012 with [place]. LHC demonstrated mid RCA 99%. PCI was attempted but unsuccessful and Med Rx was recommended. Admitted 11/2012 with gangrenous cholecystitis. He underwent laparoscopic cholecystectomy. He was complaining of fatigue and dizziness and was noted to have slow ventricular response with a heart rate of 46. Verapamil was discontinued. Metoprolol dose was cut in half. He was placed on amlodipine for blood pressure control. The patient was last seen by Tyler Williams 06/13/2013, at which time he was doing much better.  He presents today for followup evaluation. He continues to feel well. He played golf yesterday without symptoms. He denies chest pain, shortness of breath, or palpitations. His energy level is improved. He complains of chronic leg swelling, but denies arthropathy or PND.  Outpatient Encounter Prescriptions as of 07/24/2013  Medication Sig  . amLODipine (NORVASC) 5 MG tablet Take 1 tablet (5 mg total) by mouth daily.  Marland Kitchen atorvastatin (LIPITOR) 20 MG tablet TAKE 1 TABLET ONCE DAILY.  Marland Kitchen Cholecalciferol (VITAMIN D-3) 5000 UNITS TABS Take 5,000 Units by mouth daily.   . Coenzyme Q10 (CO Q 10) 100 MG CAPS Take 1 capsule by mouth every morning.   . hydrochlorothiazide (MICROZIDE) 12.5 MG capsule Take 1 capsule (12.5 mg total) by mouth daily before breakfast.  . ibuprofen (ADVIL,MOTRIN) 200 MG tablet Take 400 mg by mouth every 6 (six) hours as needed for pain.  . isosorbide mononitrate (IMDUR) 60 MG 24 hr tablet TAKE 1 TABLET ONCE DAILY.  Marland Kitchen LORazepam (ATIVAN) 1 MG tablet Take 1 mg by mouth at bedtime.   . metoprolol succinate (TOPROL-XL) 50 MG 24 hr tablet Take 1 tablet (50 mg total) by mouth daily. Take with or immediately following a meal.  . Multiple Vitamins-Minerals (CENTRUM SILVER PO) Take 1 tablet by mouth daily.    . Multiple Vitamins-Minerals (OCUVITE PRESERVISION PO) Take 1 tablet by mouth daily.  . nitroGLYCERIN (NITROSTAT) 0.4 MG SL tablet Place 1 tablet (0.4 mg total) under the tongue every 5 (five) minutes x 3 doses as needed for chest pain.  Marland Kitchen omega-3 acid ethyl esters (LOVAZA) 1 G capsule Take 1 g by mouth daily.  . Psyllium (METAMUCIL) 30.9 % POWD Take 1 packet by mouth as needed.   . ramipril (ALTACE) 10 MG capsule Take 10 mg by mouth 2 (two) times daily.  . Ranibizumab (LUCENTIS) 0.5 MG/0.05ML SOLN Inject 0.5 mg into the eye See admin instructions. Pt gets this injection every 9 weeks. Next treatment is due on 01-18-12. Pt is followed by [facility].  Gets 1 shot at a time, one week apart.  . warfarin (COUMADIN) 5 MG tablet 1 tablet every day except 1.5 tablets on Mondays or as directed by coumadin clinic  . ZETIA 10 MG tablet TAKE 1 TABLET IN THE P.M.    No Known Allergies  Past Medical History  Diagnosis Date  . CAD (coronary artery disease)     a. s/p multiple percutaneous prior coronary artery interventions b. 05/2012: unsuccessful PCI to tight RCA->medically managed  . Permanent atrial fibrillation   . PVD (peripheral vascular disease)     Bilateral renal artery atherosclerosis, SMA stenosis with collateralization  . Hypercholesteremia   . SSS (sick sinus syndrome)   . Hypertension   . GERD (gastroesophageal reflux disease)   . History of  skin cancer   . Arthritis     knees  . History of pulmonary embolism     1964 related to dislocated hip on right   . Chronic anticoagulation     Coumadin  . Aortic stenosis, mild     a. mean gradient 17 mmHg on 04/2012 TTE  . Hypertension   . Microscopic hematuria     Followed by urology  . RBBB     Chronic  . AAA (abdominal aortic aneurysm)     s/p repair  . GERD (gastroesophageal reflux disease)   . Carotid artery disease     0-39% bilateral ICA stenoses 11/2011    ROS: Positive for vision loss related to macular  degeneration, otherwise negative except as per HPI  BP 142/74  Pulse 68  Ht 5' 9.5" (1.765 m)  Wt 89.359 kg (197 lb)  BMI 28.68 kg/m2  SpO2 98%  PHYSICAL EXAM: Pt is alert and oriented, NAD HEENT: normal Neck: JVP - normal Lungs: CTA bilaterally CV: RRR with grade 2/6 systolic ejection murmur at the left sternal border Abd: soft, NT, Positive BS, no hepatomegaly Ext: 1+ edema right lower extremity, trace edema on the left, distal pulses intact and equal Skin: warm/dry no rash  2-D echocardiogram 07/17/2013: Study Conclusions  - Left ventricle: Wall thickness was increased in a pattern of mild LVH. There was focal basal hypertrophy. Systolic function was normal. The estimated ejection fraction was in the range of 60% to 65%. Wall motion was normal; there were no regional wall motion abnormalities. - Aortic valve: There was mild to moderate stenosis. - Mitral valve: There was mild regurgitation. - Left atrium: The atrium was moderately to severely dilated. - Right atrium: The atrium was moderately dilated.  ASSESSMENT AND PLAN: 1. Atrial fibrillation. Patient is anticoagulated with warfarin. His ventricular rate is improved, as is his energy level. Will continue his current medical program without changes.  2. Coronary artery disease, native vessel. The patient is stable without symptoms of angina. He will continue with his beta blocker, statin drug, he is on warfarin so we have avoided antiplatelet therapy.  3. Essential hypertension. Blood pressure well controlled on amlodipine, hydrochlorothiazide, isosorbide, and metoprolol succinate, and ramipril.  4. Hyperlipidemia. The patient takes both atorvastatin and Zetia. Z. he has become cost prohibitive. Will discontinue this and repeat lipids and LFTs in 3 months.  Sherren Mocha 07/24/2013 11:35 AM

## 2013-07-24 NOTE — Patient Instructions (Signed)
Your physician has recommended you make the following change in your medication: [facility] physician recommends that you return for a FASTING LIPID and LIVER profile in 3 MONTHS--nothing to eat or drink after midnight, lab opens at 7:30 AM  Your physician wants you to follow-up in: 1 YEAR with Dr Burt Knack.  You will receive a reminder letter in the mail two months in advance. If you don't receive a letter, please call our office to schedule the follow-up appointment.  Your physician has requested that you have an echocardiogram in 1 YEAR (one week prior to appointment with Dr Burt Knack). Echocardiography is a painless test that uses sound waves to create images of your heart. It provides your doctor with information about the size and shape of your heart and how well your heart's chambers and valves are working. This procedure takes approximately one hour. There are no restrictions for this procedure.

## 2013-07-29 DIAGNOSIS — H353290 Exudative age-related macular degeneration, unspecified eye, stage unspecified: Secondary | ICD-10-CM | POA: Diagnosis not present

## 2013-08-05 DIAGNOSIS — Z85828 Personal history of other malignant neoplasm of skin: Secondary | ICD-10-CM | POA: Diagnosis not present

## 2013-08-05 DIAGNOSIS — L570 Actinic keratosis: Secondary | ICD-10-CM | POA: Diagnosis not present

## 2013-08-05 DIAGNOSIS — D485 Neoplasm of uncertain behavior of skin: Secondary | ICD-10-CM | POA: Diagnosis not present

## 2013-08-06 DIAGNOSIS — C44621 Squamous cell carcinoma of skin of unspecified upper limb, including shoulder: Secondary | ICD-10-CM | POA: Diagnosis not present

## 2013-08-06 DIAGNOSIS — B079 Viral wart, unspecified: Secondary | ICD-10-CM | POA: Diagnosis not present

## 2013-08-06 DIAGNOSIS — D0470 Carcinoma in situ of skin of unspecified lower limb, including hip: Secondary | ICD-10-CM | POA: Diagnosis not present

## 2013-08-29 DIAGNOSIS — H35059 Retinal neovascularization, unspecified, unspecified eye: Secondary | ICD-10-CM | POA: Diagnosis not present

## 2013-08-29 DIAGNOSIS — H353290 Exudative age-related macular degeneration, unspecified eye, stage unspecified: Secondary | ICD-10-CM | POA: Diagnosis not present

## 2013-09-03 ENCOUNTER — Ambulatory Visit (INDEPENDENT_AMBULATORY_CARE_PROVIDER_SITE_OTHER): Payer: Medicare Other | Admitting: *Deleted

## 2013-09-03 DIAGNOSIS — I4891 Unspecified atrial fibrillation: Secondary | ICD-10-CM

## 2013-09-03 DIAGNOSIS — Z5181 Encounter for therapeutic drug level monitoring: Secondary | ICD-10-CM

## 2013-09-03 LAB — POCT INR: INR: 1.9

## 2013-09-12 ENCOUNTER — Other Ambulatory Visit: Payer: Self-pay | Admitting: Cardiovascular Disease

## 2013-09-24 ENCOUNTER — Other Ambulatory Visit: Payer: Self-pay | Admitting: Cardiology

## 2013-09-27 ENCOUNTER — Emergency Department (HOSPITAL_COMMUNITY): Payer: Medicare Other

## 2013-09-27 ENCOUNTER — Encounter (HOSPITAL_COMMUNITY): Payer: Self-pay | Admitting: Emergency Medicine

## 2013-09-27 ENCOUNTER — Emergency Department (HOSPITAL_COMMUNITY)
Admission: EM | Admit: 2013-09-27 | Discharge: 2013-09-28 | Disposition: A | Discharge status: Home / Self Care | Payer: Medicare Other | Attending: Emergency Medicine | Admitting: Emergency Medicine

## 2013-09-27 DIAGNOSIS — D68318 Other hemorrhagic disorder due to intrinsic circulating anticoagulants, antibodies, or inhibitors: Secondary | ICD-10-CM | POA: Diagnosis not present

## 2013-09-27 DIAGNOSIS — R000 Tachycardia, unspecified: Secondary | ICD-10-CM | POA: Insufficient documentation

## 2013-09-27 DIAGNOSIS — Z8719 Personal history of other diseases of the digestive system: Secondary | ICD-10-CM | POA: Diagnosis not present

## 2013-09-27 DIAGNOSIS — Z7901 Long term (current) use of anticoagulants: Secondary | ICD-10-CM | POA: Insufficient documentation

## 2013-09-27 DIAGNOSIS — M129 Arthropathy, unspecified: Secondary | ICD-10-CM | POA: Diagnosis not present

## 2013-09-27 DIAGNOSIS — I2510 Atherosclerotic heart disease of native coronary artery without angina pectoris: Secondary | ICD-10-CM | POA: Insufficient documentation

## 2013-09-27 DIAGNOSIS — I4891 Unspecified atrial fibrillation: Secondary | ICD-10-CM | POA: Insufficient documentation

## 2013-09-27 DIAGNOSIS — I739 Peripheral vascular disease, unspecified: Secondary | ICD-10-CM | POA: Diagnosis not present

## 2013-09-27 DIAGNOSIS — Z9889 Other specified postprocedural states: Secondary | ICD-10-CM | POA: Diagnosis not present

## 2013-09-27 DIAGNOSIS — I359 Nonrheumatic aortic valve disorder, unspecified: Secondary | ICD-10-CM | POA: Insufficient documentation

## 2013-09-27 DIAGNOSIS — Z85828 Personal history of other malignant neoplasm of skin: Secondary | ICD-10-CM | POA: Diagnosis not present

## 2013-09-27 DIAGNOSIS — N23 Unspecified renal colic: Secondary | ICD-10-CM | POA: Diagnosis not present

## 2013-09-27 DIAGNOSIS — I10 Essential (primary) hypertension: Secondary | ICD-10-CM | POA: Diagnosis not present

## 2013-09-27 DIAGNOSIS — R109 Unspecified abdominal pain: Secondary | ICD-10-CM | POA: Diagnosis not present

## 2013-09-27 DIAGNOSIS — E7800 Pure hypercholesterolemia, unspecified: Secondary | ICD-10-CM | POA: Diagnosis not present

## 2013-09-27 DIAGNOSIS — Z79899 Other long term (current) drug therapy: Secondary | ICD-10-CM | POA: Diagnosis not present

## 2013-09-27 DIAGNOSIS — I495 Sick sinus syndrome: Secondary | ICD-10-CM | POA: Diagnosis not present

## 2013-09-27 DIAGNOSIS — Z86711 Personal history of pulmonary embolism: Secondary | ICD-10-CM | POA: Diagnosis not present

## 2013-09-27 DIAGNOSIS — Z9861 Coronary angioplasty status: Secondary | ICD-10-CM | POA: Diagnosis not present

## 2013-09-27 DIAGNOSIS — Z87891 Personal history of nicotine dependence: Secondary | ICD-10-CM | POA: Diagnosis not present

## 2013-09-27 DIAGNOSIS — N201 Calculus of ureter: Secondary | ICD-10-CM | POA: Diagnosis not present

## 2013-09-27 DIAGNOSIS — I4510 Unspecified right bundle-branch block: Secondary | ICD-10-CM | POA: Insufficient documentation

## 2013-09-27 LAB — COMPREHENSIVE METABOLIC PANEL
ALT: 25 U/L (ref 0–53)
AST: 34 U/L (ref 0–37)
Albumin: 3.4 g/dL — ABNORMAL LOW (ref 3.5–5.2)
Alkaline Phosphatase: 64 U/L (ref 39–117)
Anion gap: 14 (ref 5–15)
BUN: 25 mg/dL — ABNORMAL HIGH (ref 6–23)
CO2: 23 mEq/L (ref 19–32)
Calcium: 9.1 mg/dL (ref 8.4–10.5)
Chloride: 99 mEq/L (ref 96–112)
Creatinine, Ser: 1.45 mg/dL — ABNORMAL HIGH (ref 0.50–1.35)
GFR calc Af Amer: 49 mL/min — ABNORMAL LOW (ref >90)
GFR calc non Af Amer: 42 mL/min — ABNORMAL LOW (ref >90)
Glucose, Bld: 106 mg/dL — ABNORMAL HIGH (ref 70–99)
Potassium: 4.3 mEq/L (ref 3.7–5.3)
Sodium: 136 mEq/L — ABNORMAL LOW (ref 137–147)
Total Bilirubin: 1.7 mg/dL — ABNORMAL HIGH (ref 0.3–1.2)
Total Protein: 7 g/dL (ref 6.0–8.3)

## 2013-09-27 LAB — CBC
HCT: 43 % (ref 39.0–52.0)
Hemoglobin: 15 g/dL (ref 13.0–17.0)
MCH: 32 pg (ref 26.0–34.0)
MCHC: 34.9 g/dL (ref 30.0–36.0)
MCV: 91.7 fL (ref 78.0–100.0)
Platelets: 120 10*3/uL — ABNORMAL LOW (ref 150–400)
RBC: 4.69 MIL/uL (ref 4.22–5.81)
RDW: 15.1 % (ref 11.5–15.5)
WBC: 10.6 10*3/uL — ABNORMAL HIGH (ref 4.0–10.5)

## 2013-09-27 LAB — LIPASE, BLOOD: Lipase: 42 U/L (ref 11–59)

## 2013-09-27 LAB — PROTIME-INR
INR: 4.43 — ABNORMAL HIGH (ref 0.00–1.49)
Prothrombin Time: 42.2 seconds — ABNORMAL HIGH (ref 11.6–15.2)

## 2013-09-27 MED ORDER — HYDROMORPHONE HCL PF 1 MG/ML IJ SOLN
1.0000 mg | INTRAMUSCULAR | Status: DC | PRN
  Administered 2013-09-27: 1 mg via INTRAVENOUS
  Filled 2013-09-27: qty 1

## 2013-09-27 MED ORDER — SODIUM CHLORIDE 0.9 % IV SOLN
1000.0000 mL | INTRAVENOUS | Status: DC
  Administered 2013-09-27: 1000 mL via INTRAVENOUS

## 2013-09-27 MED ORDER — IOHEXOL 300 MG/ML  SOLN: 50.0000 mL | Freq: Once | INTRAMUSCULAR | Status: AC | PRN

## 2013-09-27 MED ORDER — IOHEXOL 300 MG/ML  SOLN
100.0000 mL | Freq: Once | INTRAMUSCULAR | Status: AC | PRN
  Administered 2013-09-27: 100 mL via INTRAVENOUS

## 2013-09-27 MED ORDER — ONDANSETRON HCL 4 MG/2ML IJ SOLN
4.0000 mg | Freq: Once | INTRAMUSCULAR | Status: AC
  Administered 2013-09-27: 4 mg via INTRAVENOUS
  Filled 2013-09-27: qty 2

## 2013-09-27 MED ORDER — SODIUM CHLORIDE 0.9 % IV SOLN
1000.0000 mL | Freq: Once | INTRAVENOUS | Status: AC
  Administered 2013-09-27: 1000 mL via INTRAVENOUS

## 2013-09-27 NOTE — ED Notes (Signed)
Pt unable to void at this time. 

## 2013-09-27 NOTE — ED Notes (Signed)
Pt still unable to void

## 2013-09-27 NOTE — ED Notes (Signed)
Requested patient to urinate. 

## 2013-09-27 NOTE — ED Notes (Signed)
Patient says he has left side flank pain. He stated it feels like a kidney stone. He has had kidney stones before. Patient is feeling nauseated and dry heaving. Patient can not get comfortable. He says there is a dull pain all the time.

## 2013-09-27 NOTE — ED Provider Notes (Addendum)
CSN: [id number]     Arrival date & time 09/27/13  2007 History   First MD Initiated Contact with Patient 09/27/13 2112     Chief Complaint  Patient presents with  . Flank Pain    left side     (Consider location/radiation/quality/duration/timing/severity/associated sxs/prior Treatment) HPI Complains of left flank pain and abdominal bloating onset this afternoon. Pain is worse with moving about or changing position not improved by anything however waxes and wanes. It may feel like a kidney stone he had approximately 40 years ago. He admits to nausea denies vomiting denies fever no other associated symptoms. No treatment prior to coming here Past Medical History  Diagnosis Date  . CAD (coronary artery disease)     a. s/p multiple percutaneous prior coronary artery interventions b. 05/2012: unsuccessful PCI to tight RCA->medically managed  . Permanent atrial fibrillation   . PVD (peripheral vascular disease)     Bilateral renal artery atherosclerosis, SMA stenosis with collateralization  . Hypercholesteremia   . SSS (sick sinus syndrome)   . Hypertension   . GERD (gastroesophageal reflux disease)   . History of skin cancer   . Arthritis     knees  . History of pulmonary embolism     1964 related to dislocated hip on right   . Chronic anticoagulation     Coumadin  . Aortic stenosis, mild     a. mean gradient 17 mmHg on 04/2012 TTE  . Hypertension   . Microscopic hematuria     Followed by urology  . RBBB     Chronic  . AAA (abdominal aortic aneurysm)     s/p repair  . GERD (gastroesophageal reflux disease)   . Carotid artery disease     0-39% bilateral ICA stenoses 11/2011   Past Surgical History  Procedure Laterality Date  . Orthopedic surgery    . Coronary angioplasty  1980, 1990  . Hernia repair      right inguinal hernia repair   . Abdominal aortic aneurysm repair      1993   . Other surgical history      right knee popliteal aneurysm surgery stent placed   .  Tonsillectomy    . Other surgical history    . Hemorrhoid surgery      1973  . Eye surgery      hx of cataract surgery   . Partial knee arthroplasty  01/22/2012    Procedure: UNICOMPARTMENTAL KNEE;  Surgeon: Mauri Pole, MD;  Location: [facility];  Service: Orthopedics;  Laterality: Right;  . Joint replacement      left knee replacement, partial right  . Cardiac catheterization  05/24/2012    pD1 20%, oCFX 20%, mCFX 30%, ostial Int Br 30%, distal AV groove CFX 30%, pRCA 30-40%, mRCA 99%, dRCA 30%, PL branch small with diffuse 40%. Unable to pass wire past RCA lesion->medically managed  . Cholecystectomy N/A 12/05/2012    Procedure: LAPAROSCOPIC CHOLECYSTECTOMY ;  Surgeon: Edward Jolly, MD;  Location: [facility] OR;  Service: General;  Laterality: N/A;   Family History  Problem Relation Age of Onset  . Heart attack Mother 43   History  Substance Use Topics  . Smoking status: Former Smoker    Types: Cigarettes    Quit date: 02/14/1983  . Smokeless tobacco: Never Used  . Alcohol Use: 4.2 oz/week    7 Glasses of wine per week     Comment: 5 ounces of wine daily     Review of  Systems  Constitutional: Negative.   HENT: Negative.   Respiratory: Negative.   Cardiovascular: Negative.   Gastrointestinal: Negative.   Genitourinary: Positive for dysuria and flank pain.  Musculoskeletal: Negative.   Skin: Negative.   Neurological: Negative.   Psychiatric/Behavioral: Negative.   All other systems reviewed and are negative.     Allergies  Review of patient's allergies indicates no known allergies.  Home Medications   Prior to Admission medications   Medication Sig Start Date End Date Taking? Authorizing Provider  amLODipine (NORVASC) 5 MG tablet TAKE 1 TABLET ONCE DAILY. 09/12/13  Yes Sherren Mocha, MD  atorvastatin (LIPITOR) 20 MG tablet TAKE 1 TABLET ONCE DAILY. 09/12/13  Yes Sherren Mocha, MD  Cholecalciferol (VITAMIN D-3) 5000 UNITS TABS Take 5,000 Units by mouth daily.    Yes  Historical Provider, MD  Coenzyme Q10 (CO Q 10) 100 MG CAPS Take 1 capsule by mouth every morning.    Yes Historical Provider, MD  hydrochlorothiazide (MICROZIDE) 12.5 MG capsule TAKE (1) CAPSULE DAILY. 09/25/13  Yes Sherren Mocha, MD  ibuprofen (ADVIL,MOTRIN) 200 MG tablet Take 400 mg by mouth every 6 (six) hours as needed for pain.   Yes Historical Provider, MD  isosorbide mononitrate (IMDUR) 60 MG 24 hr tablet TAKE 1 TABLET ONCE DAILY.   Yes Peter M Martinique, MD  LORazepam (ATIVAN) 1 MG tablet Take 1 mg by mouth at bedtime.    Yes Historical Provider, MD  metoprolol succinate (TOPROL-XL) 50 MG 24 hr tablet Take 1 tablet (50 mg total) by mouth daily. Take with or immediately following a meal. 05/23/13  Yes Sherren Mocha, MD  Multiple Vitamins-Minerals (CENTRUM SILVER PO) Take 1 tablet by mouth daily.   Yes Historical Provider, MD  Multiple Vitamins-Minerals (OCUVITE PRESERVISION PO) Take 1 tablet by mouth daily.   Yes Historical Provider, MD  omega-3 acid ethyl esters (LOVAZA) 1 G capsule Take 1 g by mouth daily.   Yes Historical Provider, MD  ramipril (ALTACE) 10 MG capsule Take 10 mg by mouth 2 (two) times daily.   Yes Historical Provider, MD  Ranibizumab (LUCENTIS) 0.5 MG/0.05ML SOLN Inject 0.5 mg into the eye See admin instructions. Pt gets this injection every 9 weeks. Next treatment is due on 01-18-12. Pt is followed by [facility].  Gets 1 shot at a time, one week apart.   Yes Historical Provider, MD  warfarin (COUMADIN) 5 MG tablet Take 5-7.5 mg by mouth daily. Take 1.5 tablets on Monday (7.5mg ) & take 1 tablet all other days (5mg )   Yes Historical Provider, MD  nitroGLYCERIN (NITROSTAT) 0.4 MG SL tablet Place 1 tablet (0.4 mg total) under the tongue every 5 (five) minutes x 3 doses as needed for chest pain. 05/25/12   Brooke O Edmisten, PA-C   BP 138/61  Pulse 66  Temp(Src) 98.1 F (36.7 C) (Oral)  Resp 18  Ht 5\' 9"  (1.753 m)  Wt 195 lb (88.451 kg)  BMI 28.78 kg/m2  SpO2  97% Physical Exam  Nursing note and vitals reviewed. Constitutional: He appears well-developed and well-nourished. No distress.  HENT:  Head: Normocephalic and atraumatic.  Eyes: Conjunctivae are normal. Pupils are equal, round, and reactive to light.  Neck: Neck supple. No tracheal deviation present. No thyromegaly present.  Cardiovascular: Normal rate.   Murmur heard. Irregularly irregular with grade 3/6 systolic ejection murmur at left sternal border  Pulmonary/Chest: Effort normal and breath sounds normal.  Abdominal: Soft. Bowel sounds are normal. He exhibits no distension. There is no  tenderness.  Genitourinary: Penis normal.  No flank tenderness  Musculoskeletal: Normal range of motion. He exhibits no edema and no tenderness.  Neurological: He is alert. Coordination normal.  Skin: Skin is warm and dry. No rash noted.  Psychiatric: He has a normal mood and affect.    ED Course  Procedures (including critical care time) Labs Review Labs Reviewed  CBC  COMPREHENSIVE METABOLIC PANEL  LIPASE, BLOOD    Imaging Review No results found.   EKG Interpretation None     12:30 AM patient resting comfortably after treatment with intravenous opioids, fluids, and antiemetics. Results for orders placed during the hospital encounter of 09/27/13  CBC      Result Value Ref Range   WBC 10.6 (*) 4.0 - 10.5 K/uL   RBC 4.69  4.22 - 5.81 MIL/uL   Hemoglobin 15.0  13.0 - 17.0 g/dL   HCT 43.0  39.0 - 52.0 %   MCV 91.7  78.0 - 100.0 fL   MCH 32.0  26.0 - 34.0 pg   MCHC 34.9  30.0 - 36.0 g/dL   RDW 15.1  11.5 - 15.5 %   Platelets 120 (*) 150 - 400 K/uL  COMPREHENSIVE METABOLIC PANEL      Result Value Ref Range   Sodium 136 (*) 137 - 147 mEq/L   Potassium 4.3  3.7 - 5.3 mEq/L   Chloride 99  96 - 112 mEq/L   CO2 23  19 - 32 mEq/L   Glucose, Bld 106 (*) 70 - 99 mg/dL   BUN 25 (*) 6 - 23 mg/dL   Creatinine, Ser 1.45 (*) 0.50 - 1.35 mg/dL   Calcium 9.1  8.4 - 10.5 mg/dL   Total  Protein 7.0  6.0 - 8.3 g/dL   Albumin 3.4 (*) 3.5 - 5.2 g/dL   AST 34  0 - 37 U/L   ALT 25  0 - 53 U/L   Alkaline Phosphatase 64  39 - 117 U/L   Total Bilirubin 1.7 (*) 0.3 - 1.2 mg/dL   GFR calc non Af Amer 42 (*) >90 mL/min   GFR calc Af Amer 49 (*) >90 mL/min   Anion gap 14  5 - 15  LIPASE, BLOOD      Result Value Ref Range   Lipase 42  11 - 59 U/L  PROTIME-INR      Result Value Ref Range   Prothrombin Time 42.2 (*) 11.6 - 15.2 seconds   INR 4.43 (*) 0.00 - 1.49  URINALYSIS, ROUTINE W REFLEX MICROSCOPIC      Result Value Ref Range   Color, Urine YELLOW  YELLOW   APPearance CLEAR  CLEAR   Specific Gravity, Urine 1.024  1.005 - 1.030   pH 5.0  5.0 - 8.0   Glucose, UA NEGATIVE  NEGATIVE mg/dL   Hgb urine dipstick MODERATE (*) NEGATIVE   Bilirubin Urine NEGATIVE  NEGATIVE   Ketones, ur NEGATIVE  NEGATIVE mg/dL   Protein, ur NEGATIVE  NEGATIVE mg/dL   Urobilinogen, UA 0.2  0.0 - 1.0 mg/dL   Nitrite NEGATIVE  NEGATIVE   Leukocytes, UA NEGATIVE  NEGATIVE  URINE MICROSCOPIC-ADD ON      Result Value Ref Range   Squamous Epithelial / LPF FEW (*) RARE   RBC / HPF 7-10  <3 RBC/hpf   Bacteria, UA RARE  RARE   Ct Abdomen Pelvis W Contrast  09/28/2013   CLINICAL DATA:  Left flank pain.  History of kidney stones.  EXAM: CT  ABDOMEN AND PELVIS WITH CONTRAST  TECHNIQUE: Multidetector CT imaging of the abdomen and pelvis was performed using the standard protocol following bolus administration of intravenous contrast.  CONTRAST:  16mL OMNIPAQUE IOHEXOL 300 MG/ML  SOLN  COMPARISON:  CT 12/03/2012  FINDINGS: Linear opacities in the lung bases, likely scarring and/or atelectasis. Heart is borderline in size. Extensive coronary artery calcifications. No pleural effusions.  Prior cholecystectomy. Liver, spleen, pancreas, adrenals are unremarkable. Bilateral renal cysts present. Slight left hydronephrosis. Punctate 1-2 mm left UVJ stone. No hydronephrosis or ureteral stones on the right.  Prostate is  enlarged.  Urinary bladder is unremarkable.  Small amount of free fluid in the cul-de-sac of the pelvis of unknown etiology. Descending colonic and sigmoid diverticulosis. No active diverticulitis. Small bowel is decompressed, grossly unremarkable. Appendix is normal. Stomach unremarkable.  Left inguinal hernia containing a portion of sigmoid colon. No obstruction.  Prior aortic bypass. Diffuse atherosclerotic change throughout the aorta.  Degenerative changes in the lumbar spine.  IMPRESSION: 1-2 mm left UVJ stone with mild left hydronephrosis.  Bilateral renal cysts.  Left colonic diverticulosis.  Small amount of free fluid in the pelvis.  Prior aortic bypass.   Electronically Signed   By: Rolm Baptise M.D.   On: 09/28/2013 00:11    MDM  Plan patient to skip Coumadin dose 09/28/13. Take 5 mg on8/17/16 instead of his 7.5 mg dose that day. Prescription Percocet. Call Dr. Burt Knack, his cardiologist to arrange for recheck INR later this week. Call Dr. Diona Fanti to arrange to be seen in the office next week. Stop Motrin Diagnosis #1 ureteral colic #2 Coumadin toxicity #3 renal insufficiency Final diagnoses:  None        Orlie Dakin, MD 09/28/13 [id number]  Orlie Dakin, MD 09/28/13 0104

## 2013-09-28 DIAGNOSIS — N201 Calculus of ureter: Secondary | ICD-10-CM | POA: Diagnosis not present

## 2013-09-28 LAB — URINALYSIS, ROUTINE W REFLEX MICROSCOPIC
Bilirubin Urine: NEGATIVE
Glucose, UA: NEGATIVE mg/dL
Ketones, ur: NEGATIVE mg/dL
Leukocytes, UA: NEGATIVE
Nitrite: NEGATIVE
Protein, ur: NEGATIVE mg/dL
Specific Gravity, Urine: 1.024 (ref 1.005–1.030)
Urobilinogen, UA: 0.2 mg/dL (ref 0.0–1.0)
pH: 5 (ref 5.0–8.0)

## 2013-09-28 LAB — URINE MICROSCOPIC-ADD ON

## 2013-09-28 MED ORDER — OXYCODONE-ACETAMINOPHEN 5-325 MG PO TABS: 1.0000 | ORAL_TABLET | Freq: Four times a day (QID) | ORAL | Status: DC | PRN

## 2013-09-28 MED ORDER — ONDANSETRON HCL 8 MG PO TABS: 8.0000 mg | ORAL_TABLET | Freq: Three times a day (TID) | ORAL | Status: DC | PRN

## 2013-09-28 NOTE — Discharge Instructions (Signed)
Kidney Stones Your INR today was 4.43 which is too high. Did not take any Coumadin today, August 16. Take 5 mg on Monday, August 17 instead of your usual 7.5 mg dose. Take 5 mg daily after that. Call Dr. Burt Knack on Monday to arrange to get your INR recheck next week. Stop taking Motrin. Motrin together with Coumadin can cause bleeding. Take Tylenol for mild pain with the pain medicine prescribed for bad pain. Do not take the pain medicine prescribed together with lorazepam, as the combination can be dangerous. Call Dr. Diona Fanti on Monday to arrange to be seen in his office at the next available appointment for your kidney stone. Kidney stones (urolithiasis) are solid masses that form inside your kidneys. The intense pain is caused by the stone moving through the kidney, ureter, bladder, and urethra (urinary tract). When the stone moves, the ureter starts to spasm around the stone. The stone is usually passed in your pee (urine).  HOME CARE  Drink enough fluids to keep your pee clear or pale yellow. This helps to get the stone out.  Strain all pee through the provided strainer. Do not pee without peeing through the strainer, not even once. If you pee the stone out, catch it in the strainer. The stone may be as small as a grain of salt. Take this to your doctor. This will help your doctor figure out what you can do to try to prevent more kidney stones.  Only take medicine as told by your doctor.  Follow up with your doctor as told.  Get follow-up X-rays as told by your doctor. GET HELP IF: You have pain that gets worse even if you have been taking pain medicine. GET HELP RIGHT AWAY IF:   Your pain does not get better with medicine.  You have a fever or shaking chills.  Your pain increases and gets worse over 18 hours.  You have new belly (abdominal) pain.  You feel faint or pass out.  You are unable to pee. MAKE SURE YOU:   Understand these instructions.  Will watch your  condition.  Will get help right away if you are not doing well or get worse. Document Released: 07/19/2007 Document Revised: 10/02/2012 Document Reviewed: 07/03/2012 [facility] Patient Information 2015 [place], [place]. This information is not intended to replace advice given to you by your health care provider. Make sure you discuss any questions you have with your health care provider.

## 2013-09-30 ENCOUNTER — Telehealth: Payer: Self-pay | Admitting: Cardiovascular Disease

## 2013-09-30 DIAGNOSIS — H353290 Exudative age-related macular degeneration, unspecified eye, stage unspecified: Secondary | ICD-10-CM | POA: Diagnosis not present

## 2013-09-30 NOTE — Telephone Encounter (Signed)
°  Patient would like to speak with you regarding AFIB. Please call and advise.

## 2013-09-30 NOTE — Telephone Encounter (Signed)
I spoke with the pt and he complains of feeling "washed out, no energy".  The pt would like to know if this is related to his AFib. The pt's most recent BP 137/60, pulse 75.  I made the pt aware that at this time he could be feeling tired due to just passing a kidney stone over the weekend.  I told the pt to rest over the next few days.  The pt is scheduled for INR on Friday and I advised him to let me know how he is feeling when he comes into the office. Pt agreed with plan.

## 2013-10-03 ENCOUNTER — Ambulatory Visit (INDEPENDENT_AMBULATORY_CARE_PROVIDER_SITE_OTHER): Payer: Medicare Other

## 2013-10-03 ENCOUNTER — Other Ambulatory Visit (INDEPENDENT_AMBULATORY_CARE_PROVIDER_SITE_OTHER): Payer: Medicare Other

## 2013-10-03 ENCOUNTER — Other Ambulatory Visit: Payer: Self-pay | Admitting: Cardiovascular Disease

## 2013-10-03 DIAGNOSIS — I2510 Atherosclerotic heart disease of native coronary artery without angina pectoris: Secondary | ICD-10-CM

## 2013-10-03 DIAGNOSIS — I4891 Unspecified atrial fibrillation: Secondary | ICD-10-CM

## 2013-10-03 DIAGNOSIS — Z5181 Encounter for therapeutic drug level monitoring: Secondary | ICD-10-CM

## 2013-10-03 DIAGNOSIS — I359 Nonrheumatic aortic valve disorder, unspecified: Secondary | ICD-10-CM | POA: Diagnosis not present

## 2013-10-03 LAB — POCT INR: INR: 2.1

## 2013-10-04 LAB — LIPID PANEL
Cholesterol: 126 mg/dL (ref 0–200)
HDL: 45.5 mg/dL (ref >39.00)
LDL Cholesterol: 69 mg/dL (ref 0–99)
NonHDL: 80.5
Total CHOL/HDL Ratio: 3
Triglycerides: 58 mg/dL (ref 0.0–149.0)
VLDL: 11.6 mg/dL (ref 0.0–40.0)

## 2013-10-04 LAB — HEPATIC FUNCTION PANEL
ALT: 31 U/L (ref 0–53)
AST: 32 U/L (ref 0–37)
Albumin: 3.6 g/dL (ref 3.5–5.2)
Alkaline Phosphatase: 58 U/L (ref 39–117)
Bilirubin, Direct: 0.3 mg/dL (ref 0.0–0.3)
Total Bilirubin: 1.3 mg/dL — ABNORMAL HIGH (ref 0.2–1.2)
Total Protein: 6.8 g/dL (ref 6.0–8.3)

## 2013-10-15 ENCOUNTER — Telehealth: Payer: Self-pay | Admitting: Cardiovascular Disease

## 2013-10-15 NOTE — Telephone Encounter (Signed)
Pt is aware of labs results 

## 2013-10-15 NOTE — Telephone Encounter (Signed)
°  Patient would like to discuss lab work ( lipid panel). Please call and advise.

## 2013-10-16 ENCOUNTER — Other Ambulatory Visit: Payer: Medicare Other

## 2013-10-17 ENCOUNTER — Other Ambulatory Visit: Payer: Self-pay | Admitting: Cardiology

## 2013-10-23 ENCOUNTER — Other Ambulatory Visit: Payer: Medicare Other

## 2013-10-29 ENCOUNTER — Encounter: Payer: Self-pay | Admitting: Cardiovascular Disease

## 2013-10-29 NOTE — Telephone Encounter (Signed)
New message    Fax sent over on  9/10 .   Second fax was fax over today    Office calling checking on status of cardiac clearance.

## 2013-10-30 NOTE — Telephone Encounter (Signed)
This encounter was created in error - please disregard.

## 2013-11-04 DIAGNOSIS — H35359 Cystoid macular degeneration, unspecified eye: Secondary | ICD-10-CM | POA: Diagnosis not present

## 2013-11-04 DIAGNOSIS — H35059 Retinal neovascularization, unspecified, unspecified eye: Secondary | ICD-10-CM | POA: Diagnosis not present

## 2013-11-04 DIAGNOSIS — H353290 Exudative age-related macular degeneration, unspecified eye, stage unspecified: Secondary | ICD-10-CM | POA: Diagnosis not present

## 2013-11-07 ENCOUNTER — Ambulatory Visit (INDEPENDENT_AMBULATORY_CARE_PROVIDER_SITE_OTHER): Payer: Medicare Other | Admitting: Pharmacist

## 2013-11-07 DIAGNOSIS — I4891 Unspecified atrial fibrillation: Secondary | ICD-10-CM

## 2013-11-07 DIAGNOSIS — Z5181 Encounter for therapeutic drug level monitoring: Secondary | ICD-10-CM

## 2013-11-07 LAB — POCT INR: INR: 1.1

## 2013-11-20 ENCOUNTER — Other Ambulatory Visit (HOSPITAL_COMMUNITY): Payer: Self-pay | Admitting: *Deleted

## 2013-11-20 DIAGNOSIS — I6523 Occlusion and stenosis of bilateral carotid arteries: Secondary | ICD-10-CM

## 2013-11-21 ENCOUNTER — Ambulatory Visit (INDEPENDENT_AMBULATORY_CARE_PROVIDER_SITE_OTHER): Payer: Medicare Other | Admitting: *Deleted

## 2013-11-21 DIAGNOSIS — Z5181 Encounter for therapeutic drug level monitoring: Secondary | ICD-10-CM | POA: Diagnosis not present

## 2013-11-21 DIAGNOSIS — I4891 Unspecified atrial fibrillation: Secondary | ICD-10-CM

## 2013-11-21 LAB — POCT INR: INR: 1.9

## 2013-11-27 DIAGNOSIS — Z23 Encounter for immunization: Secondary | ICD-10-CM | POA: Diagnosis not present

## 2013-11-28 ENCOUNTER — Other Ambulatory Visit: Payer: Self-pay

## 2013-12-03 DIAGNOSIS — H3532 Exudative age-related macular degeneration: Secondary | ICD-10-CM | POA: Diagnosis not present

## 2013-12-05 ENCOUNTER — Ambulatory Visit (HOSPITAL_COMMUNITY): Payer: Medicare Other | Attending: Cardiology | Admitting: *Deleted

## 2013-12-05 DIAGNOSIS — E785 Hyperlipidemia, unspecified: Secondary | ICD-10-CM | POA: Diagnosis not present

## 2013-12-05 DIAGNOSIS — I2510 Atherosclerotic heart disease of native coronary artery without angina pectoris: Secondary | ICD-10-CM | POA: Insufficient documentation

## 2013-12-05 DIAGNOSIS — I10 Essential (primary) hypertension: Secondary | ICD-10-CM | POA: Insufficient documentation

## 2013-12-05 DIAGNOSIS — I6523 Occlusion and stenosis of bilateral carotid arteries: Secondary | ICD-10-CM | POA: Diagnosis not present

## 2013-12-05 DIAGNOSIS — Z87891 Personal history of nicotine dependence: Secondary | ICD-10-CM | POA: Insufficient documentation

## 2013-12-05 DIAGNOSIS — I739 Peripheral vascular disease, unspecified: Secondary | ICD-10-CM | POA: Insufficient documentation

## 2013-12-05 NOTE — Progress Notes (Signed)
Carotid Duplex Performed 

## 2013-12-09 ENCOUNTER — Other Ambulatory Visit: Payer: Self-pay | Admitting: Cardiovascular Disease

## 2013-12-09 DIAGNOSIS — H35352 Cystoid macular degeneration, left eye: Secondary | ICD-10-CM | POA: Diagnosis not present

## 2013-12-09 DIAGNOSIS — H3532 Exudative age-related macular degeneration: Secondary | ICD-10-CM | POA: Diagnosis not present

## 2013-12-19 DIAGNOSIS — H3532 Exudative age-related macular degeneration: Secondary | ICD-10-CM | POA: Diagnosis not present

## 2013-12-29 ENCOUNTER — Ambulatory Visit (INDEPENDENT_AMBULATORY_CARE_PROVIDER_SITE_OTHER): Payer: Medicare Other | Admitting: Pharmacist

## 2013-12-29 DIAGNOSIS — R312 Other microscopic hematuria: Secondary | ICD-10-CM | POA: Diagnosis not present

## 2013-12-29 DIAGNOSIS — L570 Actinic keratosis: Secondary | ICD-10-CM | POA: Diagnosis not present

## 2013-12-29 DIAGNOSIS — R350 Frequency of micturition: Secondary | ICD-10-CM | POA: Diagnosis not present

## 2013-12-29 DIAGNOSIS — N390 Urinary tract infection, site not specified: Secondary | ICD-10-CM | POA: Diagnosis not present

## 2013-12-29 DIAGNOSIS — D2261 Melanocytic nevi of right upper limb, including shoulder: Secondary | ICD-10-CM | POA: Diagnosis not present

## 2013-12-29 DIAGNOSIS — Z5181 Encounter for therapeutic drug level monitoring: Secondary | ICD-10-CM | POA: Diagnosis not present

## 2013-12-29 DIAGNOSIS — I4891 Unspecified atrial fibrillation: Secondary | ICD-10-CM

## 2013-12-29 DIAGNOSIS — L814 Other melanin hyperpigmentation: Secondary | ICD-10-CM | POA: Diagnosis not present

## 2013-12-29 DIAGNOSIS — N5201 Erectile dysfunction due to arterial insufficiency: Secondary | ICD-10-CM | POA: Diagnosis not present

## 2013-12-29 DIAGNOSIS — L821 Other seborrheic keratosis: Secondary | ICD-10-CM | POA: Diagnosis not present

## 2013-12-29 LAB — POCT INR: INR: 2.5

## 2014-01-13 DIAGNOSIS — H35352 Cystoid macular degeneration, left eye: Secondary | ICD-10-CM | POA: Diagnosis not present

## 2014-01-13 DIAGNOSIS — H3532 Exudative age-related macular degeneration: Secondary | ICD-10-CM | POA: Diagnosis not present

## 2014-01-21 ENCOUNTER — Telehealth: Payer: Self-pay | Admitting: Cardiovascular Disease

## 2014-01-21 NOTE — Telephone Encounter (Signed)
PT WOULD LIKE RESULTS OF CAROTID FROM OCTOBER

## 2014-01-21 NOTE — Telephone Encounter (Signed)
Pt aware of carotid results.

## 2014-01-22 ENCOUNTER — Encounter (HOSPITAL_COMMUNITY): Payer: Self-pay | Admitting: Cardiovascular Disease

## 2014-01-26 ENCOUNTER — Ambulatory Visit (INDEPENDENT_AMBULATORY_CARE_PROVIDER_SITE_OTHER): Payer: Medicare Other | Admitting: Pharmacist

## 2014-01-26 DIAGNOSIS — Z5181 Encounter for therapeutic drug level monitoring: Secondary | ICD-10-CM | POA: Diagnosis not present

## 2014-01-26 DIAGNOSIS — I10 Essential (primary) hypertension: Secondary | ICD-10-CM | POA: Diagnosis not present

## 2014-01-26 DIAGNOSIS — E785 Hyperlipidemia, unspecified: Secondary | ICD-10-CM | POA: Diagnosis not present

## 2014-01-26 DIAGNOSIS — I739 Peripheral vascular disease, unspecified: Secondary | ICD-10-CM | POA: Diagnosis not present

## 2014-01-26 DIAGNOSIS — I2510 Atherosclerotic heart disease of native coronary artery without angina pectoris: Secondary | ICD-10-CM | POA: Diagnosis not present

## 2014-01-26 DIAGNOSIS — M1990 Unspecified osteoarthritis, unspecified site: Secondary | ICD-10-CM | POA: Diagnosis not present

## 2014-01-26 DIAGNOSIS — I4891 Unspecified atrial fibrillation: Secondary | ICD-10-CM

## 2014-01-26 DIAGNOSIS — Z6829 Body mass index (BMI) 29.0-29.9, adult: Secondary | ICD-10-CM | POA: Diagnosis not present

## 2014-01-26 DIAGNOSIS — I482 Chronic atrial fibrillation: Secondary | ICD-10-CM | POA: Diagnosis not present

## 2014-01-26 LAB — POCT INR: INR: 2

## 2014-01-30 DIAGNOSIS — N401 Enlarged prostate with lower urinary tract symptoms: Secondary | ICD-10-CM | POA: Diagnosis not present

## 2014-01-30 DIAGNOSIS — R350 Frequency of micturition: Secondary | ICD-10-CM | POA: Diagnosis not present

## 2014-02-17 DIAGNOSIS — H35059 Retinal neovascularization, unspecified, unspecified eye: Secondary | ICD-10-CM | POA: Diagnosis not present

## 2014-02-17 DIAGNOSIS — H3532 Exudative age-related macular degeneration: Secondary | ICD-10-CM | POA: Diagnosis not present

## 2014-02-23 ENCOUNTER — Ambulatory Visit (INDEPENDENT_AMBULATORY_CARE_PROVIDER_SITE_OTHER): Payer: Medicare Other

## 2014-02-23 DIAGNOSIS — Z5181 Encounter for therapeutic drug level monitoring: Secondary | ICD-10-CM

## 2014-02-23 DIAGNOSIS — I4891 Unspecified atrial fibrillation: Secondary | ICD-10-CM | POA: Diagnosis not present

## 2014-02-23 LAB — POCT INR: INR: 1.8

## 2014-03-09 DIAGNOSIS — H35433 Paving stone degeneration of retina, bilateral: Secondary | ICD-10-CM | POA: Diagnosis not present

## 2014-03-09 DIAGNOSIS — H43813 Vitreous degeneration, bilateral: Secondary | ICD-10-CM | POA: Diagnosis not present

## 2014-03-09 DIAGNOSIS — H3532 Exudative age-related macular degeneration: Secondary | ICD-10-CM | POA: Diagnosis not present

## 2014-03-10 DIAGNOSIS — H3532 Exudative age-related macular degeneration: Secondary | ICD-10-CM | POA: Diagnosis not present

## 2014-03-23 ENCOUNTER — Ambulatory Visit (INDEPENDENT_AMBULATORY_CARE_PROVIDER_SITE_OTHER): Payer: Medicare Other | Admitting: Pharmacist

## 2014-03-23 DIAGNOSIS — Z5181 Encounter for therapeutic drug level monitoring: Secondary | ICD-10-CM | POA: Diagnosis not present

## 2014-03-23 DIAGNOSIS — I4891 Unspecified atrial fibrillation: Secondary | ICD-10-CM | POA: Diagnosis not present

## 2014-03-23 LAB — POCT INR: INR: 1.9

## 2014-04-07 DIAGNOSIS — H3532 Exudative age-related macular degeneration: Secondary | ICD-10-CM | POA: Diagnosis not present

## 2014-04-07 DIAGNOSIS — H43813 Vitreous degeneration, bilateral: Secondary | ICD-10-CM | POA: Diagnosis not present

## 2014-04-13 ENCOUNTER — Other Ambulatory Visit: Payer: Self-pay | Admitting: Cardiovascular Disease

## 2014-04-14 DIAGNOSIS — Z87891 Personal history of nicotine dependence: Secondary | ICD-10-CM | POA: Diagnosis not present

## 2014-04-14 DIAGNOSIS — Z961 Presence of intraocular lens: Secondary | ICD-10-CM | POA: Diagnosis not present

## 2014-04-14 DIAGNOSIS — H3532 Exudative age-related macular degeneration: Secondary | ICD-10-CM | POA: Diagnosis not present

## 2014-04-20 ENCOUNTER — Ambulatory Visit (INDEPENDENT_AMBULATORY_CARE_PROVIDER_SITE_OTHER): Payer: Medicare Other

## 2014-04-20 DIAGNOSIS — Z5181 Encounter for therapeutic drug level monitoring: Secondary | ICD-10-CM | POA: Diagnosis not present

## 2014-04-20 DIAGNOSIS — I4891 Unspecified atrial fibrillation: Secondary | ICD-10-CM

## 2014-04-20 LAB — POCT INR: INR: 2.4

## 2014-04-22 ENCOUNTER — Other Ambulatory Visit: Payer: Self-pay | Admitting: Cardiovascular Disease

## 2014-05-19 ENCOUNTER — Ambulatory Visit (INDEPENDENT_AMBULATORY_CARE_PROVIDER_SITE_OTHER): Payer: Medicare Other | Admitting: *Deleted

## 2014-05-19 DIAGNOSIS — I4891 Unspecified atrial fibrillation: Secondary | ICD-10-CM

## 2014-05-19 DIAGNOSIS — Z5181 Encounter for therapeutic drug level monitoring: Secondary | ICD-10-CM | POA: Diagnosis not present

## 2014-05-19 LAB — POCT INR: INR: 2.1

## 2014-05-20 DIAGNOSIS — H35352 Cystoid macular degeneration, left eye: Secondary | ICD-10-CM | POA: Diagnosis not present

## 2014-05-20 DIAGNOSIS — H3532 Exudative age-related macular degeneration: Secondary | ICD-10-CM | POA: Diagnosis not present

## 2014-06-15 ENCOUNTER — Telehealth: Payer: Self-pay | Admitting: Cardiovascular Disease

## 2014-06-15 NOTE — Telephone Encounter (Signed)
Request for surgical clearance:  1. What type of surgery is being performed? colonoscopy   2. When is this surgery scheduled? 5/19  3. Are there any medications that need to be held prior to surgery and how long? Warfarin (when to stop/start)  4. Name of physician performing surgery? [facility]   5. What is your office phone and fax number? [phone] 6.

## 2014-06-15 NOTE — Telephone Encounter (Signed)
Pt has a CHADS score of 2 and no history of TIA/CVA.  Per protocol, okay to hold Coumadin x 5 days prior to colonoscopy and restart ASAP afterwards.  Will fax information to Dr. Liliane Channel office. Pt has appt on 5/3 and will discuss instructions at that time.

## 2014-06-16 ENCOUNTER — Ambulatory Visit (INDEPENDENT_AMBULATORY_CARE_PROVIDER_SITE_OTHER): Payer: Medicare Other | Admitting: *Deleted

## 2014-06-16 DIAGNOSIS — Z5181 Encounter for therapeutic drug level monitoring: Secondary | ICD-10-CM | POA: Diagnosis not present

## 2014-06-16 DIAGNOSIS — I4891 Unspecified atrial fibrillation: Secondary | ICD-10-CM | POA: Diagnosis not present

## 2014-06-16 LAB — POCT INR: INR: 2.6

## 2014-06-25 DIAGNOSIS — Z961 Presence of intraocular lens: Secondary | ICD-10-CM | POA: Diagnosis not present

## 2014-06-25 DIAGNOSIS — H3532 Exudative age-related macular degeneration: Secondary | ICD-10-CM | POA: Diagnosis not present

## 2014-07-01 DIAGNOSIS — Z7901 Long term (current) use of anticoagulants: Secondary | ICD-10-CM | POA: Diagnosis not present

## 2014-07-01 DIAGNOSIS — Z5181 Encounter for therapeutic drug level monitoring: Secondary | ICD-10-CM | POA: Diagnosis not present

## 2014-07-02 DIAGNOSIS — Z86010 Personal history of colonic polyps: Secondary | ICD-10-CM | POA: Diagnosis not present

## 2014-07-02 DIAGNOSIS — K5730 Diverticulosis of large intestine without perforation or abscess without bleeding: Secondary | ICD-10-CM | POA: Diagnosis not present

## 2014-07-02 DIAGNOSIS — D122 Benign neoplasm of ascending colon: Secondary | ICD-10-CM | POA: Diagnosis not present

## 2014-07-02 DIAGNOSIS — K635 Polyp of colon: Secondary | ICD-10-CM | POA: Diagnosis not present

## 2014-07-02 DIAGNOSIS — D123 Benign neoplasm of transverse colon: Secondary | ICD-10-CM | POA: Diagnosis not present

## 2014-07-10 ENCOUNTER — Ambulatory Visit (INDEPENDENT_AMBULATORY_CARE_PROVIDER_SITE_OTHER): Payer: Medicare Other | Admitting: *Deleted

## 2014-07-10 DIAGNOSIS — I4891 Unspecified atrial fibrillation: Secondary | ICD-10-CM | POA: Diagnosis not present

## 2014-07-10 DIAGNOSIS — Z5181 Encounter for therapeutic drug level monitoring: Secondary | ICD-10-CM

## 2014-07-10 LAB — POCT INR: INR: 2.7

## 2014-07-17 ENCOUNTER — Other Ambulatory Visit: Payer: Self-pay | Admitting: Cardiovascular Disease

## 2014-07-30 DIAGNOSIS — H3532 Exudative age-related macular degeneration: Secondary | ICD-10-CM | POA: Diagnosis not present

## 2014-08-05 ENCOUNTER — Telehealth: Payer: Self-pay | Admitting: Cardiovascular Disease

## 2014-08-05 DIAGNOSIS — I350 Nonrheumatic aortic (valve) stenosis: Secondary | ICD-10-CM

## 2014-08-05 NOTE — Telephone Encounter (Signed)
New problem   Pt want to speak to you concerning when he need to be seen.

## 2014-08-05 NOTE — Telephone Encounter (Signed)
I spoke with the pt and made him aware that he is due for an Echo and OV with Dr Burt Knack this month.  Currently Dr Burt Knack does not have any openings in his schedule. I made the pt an appointment for echo to be performed on 08/11/14. I reassured the pt that I will contact him with an appointment once I have availability in Dr Antionette Char schedule. Pt agreed with plan.

## 2014-08-07 ENCOUNTER — Ambulatory Visit (INDEPENDENT_AMBULATORY_CARE_PROVIDER_SITE_OTHER): Payer: Medicare Other

## 2014-08-07 DIAGNOSIS — I4891 Unspecified atrial fibrillation: Secondary | ICD-10-CM | POA: Diagnosis not present

## 2014-08-07 DIAGNOSIS — Z5181 Encounter for therapeutic drug level monitoring: Secondary | ICD-10-CM

## 2014-08-07 LAB — POCT INR: INR: 2.6

## 2014-08-10 ENCOUNTER — Other Ambulatory Visit: Payer: Self-pay

## 2014-08-11 ENCOUNTER — Other Ambulatory Visit (HOSPITAL_COMMUNITY): Payer: Medicare Other

## 2014-08-12 DIAGNOSIS — R8290 Unspecified abnormal findings in urine: Secondary | ICD-10-CM | POA: Diagnosis not present

## 2014-08-12 DIAGNOSIS — Z125 Encounter for screening for malignant neoplasm of prostate: Secondary | ICD-10-CM | POA: Diagnosis not present

## 2014-08-12 DIAGNOSIS — N390 Urinary tract infection, site not specified: Secondary | ICD-10-CM | POA: Diagnosis not present

## 2014-08-12 DIAGNOSIS — Z85828 Personal history of other malignant neoplasm of skin: Secondary | ICD-10-CM | POA: Diagnosis not present

## 2014-08-12 DIAGNOSIS — E785 Hyperlipidemia, unspecified: Secondary | ICD-10-CM | POA: Diagnosis not present

## 2014-08-12 DIAGNOSIS — I10 Essential (primary) hypertension: Secondary | ICD-10-CM | POA: Diagnosis not present

## 2014-08-12 DIAGNOSIS — C44529 Squamous cell carcinoma of skin of other part of trunk: Secondary | ICD-10-CM | POA: Diagnosis not present

## 2014-08-18 ENCOUNTER — Other Ambulatory Visit: Payer: Self-pay | Admitting: Cardiology

## 2014-08-19 ENCOUNTER — Encounter: Payer: Self-pay | Admitting: Cardiovascular Disease

## 2014-08-19 ENCOUNTER — Ambulatory Visit (INDEPENDENT_AMBULATORY_CARE_PROVIDER_SITE_OTHER): Payer: Medicare Other | Admitting: Cardiovascular Disease

## 2014-08-19 VITALS — BP 130/70 | HR 62 | Ht 69.0 in | Wt 189.1 lb

## 2014-08-19 DIAGNOSIS — I10 Essential (primary) hypertension: Secondary | ICD-10-CM

## 2014-08-19 DIAGNOSIS — Z6827 Body mass index (BMI) 27.0-27.9, adult: Secondary | ICD-10-CM | POA: Diagnosis not present

## 2014-08-19 DIAGNOSIS — I2510 Atherosclerotic heart disease of native coronary artery without angina pectoris: Secondary | ICD-10-CM

## 2014-08-19 DIAGNOSIS — Z1389 Encounter for screening for other disorder: Secondary | ICD-10-CM | POA: Diagnosis not present

## 2014-08-19 DIAGNOSIS — F419 Anxiety disorder, unspecified: Secondary | ICD-10-CM | POA: Diagnosis not present

## 2014-08-19 DIAGNOSIS — M1990 Unspecified osteoarthritis, unspecified site: Secondary | ICD-10-CM | POA: Diagnosis not present

## 2014-08-19 DIAGNOSIS — I350 Nonrheumatic aortic (valve) stenosis: Secondary | ICD-10-CM | POA: Diagnosis not present

## 2014-08-19 DIAGNOSIS — I482 Chronic atrial fibrillation: Secondary | ICD-10-CM | POA: Diagnosis not present

## 2014-08-19 DIAGNOSIS — I4891 Unspecified atrial fibrillation: Secondary | ICD-10-CM

## 2014-08-19 DIAGNOSIS — D692 Other nonthrombocytopenic purpura: Secondary | ICD-10-CM | POA: Insufficient documentation

## 2014-08-19 DIAGNOSIS — Z0000 Encounter for general adult medical examination without abnormal findings: Secondary | ICD-10-CM | POA: Diagnosis not present

## 2014-08-19 DIAGNOSIS — Z7901 Long term (current) use of anticoagulants: Secondary | ICD-10-CM | POA: Diagnosis not present

## 2014-08-19 DIAGNOSIS — E785 Hyperlipidemia, unspecified: Secondary | ICD-10-CM | POA: Diagnosis not present

## 2014-08-19 NOTE — Progress Notes (Signed)
Cardiology Office Note   Date:  08/21/2014   ID:  Tyler Williams, DOB 1927-01-23, MRN [id number]  PCP:  Geoffery Lyons, MD  Cardiologist:  Sherren Mocha, MD    Chief Complaint  Patient presents with  . Coronary Artery Disease     History of Present Illness: Tyler Williams is a 81 y.o. male who presents for follow-up of chronic atrial fibrillation on coumadin, CAD s/p PTCA in 1980s and 1990s, PAD, AAA repair, and AS. He was admitted 05/2012 with [place]. LHC demonstrated mid RCA 99%. PCI was attempted but unsuccessful and Med Rx was recommended.   The patient has been doing well from a cardiac perspective. He denies chest pain, pressure, or shortness of breath. He has chronic leg swelling without recent change. No orthopnea, PND, lightheadedness, or syncope. He plays golf regularly and has no symptoms associated with walking.  Past Medical History  Diagnosis Date  . CAD (coronary artery disease)     a. s/p multiple percutaneous prior coronary artery interventions b. 05/2012: unsuccessful PCI to tight RCA->medically managed  . Permanent atrial fibrillation   . PVD (peripheral vascular disease)     Bilateral renal artery atherosclerosis, SMA stenosis with collateralization  . Hypercholesteremia   . SSS (sick sinus syndrome)   . Hypertension   . GERD (gastroesophageal reflux disease)   . History of skin cancer   . Arthritis     knees  . History of pulmonary embolism     1964 related to dislocated hip on right   . Chronic anticoagulation     Coumadin  . Aortic stenosis, mild     a. mean gradient 17 mmHg on 04/2012 TTE  . Hypertension   . Microscopic hematuria     Followed by urology  . RBBB     Chronic  . AAA (abdominal aortic aneurysm)     s/p repair  . GERD (gastroesophageal reflux disease)   . Carotid artery disease     0-39% bilateral ICA stenoses 11/2011    Past Surgical History  Procedure Laterality Date  . Orthopedic surgery    . Coronary angioplasty  1980,  1990  . Hernia repair      right inguinal hernia repair   . Abdominal aortic aneurysm repair      1993   . Other surgical history      right knee popliteal aneurysm surgery stent placed   . Tonsillectomy    . Other surgical history    . Hemorrhoid surgery      1973  . Eye surgery      hx of cataract surgery   . Partial knee arthroplasty  01/22/2012    Procedure: UNICOMPARTMENTAL KNEE;  Surgeon: Mauri Pole, MD;  Location: [facility];  Service: Orthopedics;  Laterality: Right;  . Joint replacement      left knee replacement, partial right  . Cardiac catheterization  05/24/2012    pD1 20%, oCFX 20%, mCFX 30%, ostial Int Br 30%, distal AV groove CFX 30%, pRCA 30-40%, mRCA 99%, dRCA 30%, PL branch small with diffuse 40%. Unable to pass wire past RCA lesion->medically managed  . Cholecystectomy N/A 12/05/2012    Procedure: LAPAROSCOPIC CHOLECYSTECTOMY ;  Surgeon: Edward Jolly, MD;  Location: [facility] OR;  Service: General;  Laterality: N/A;  . Left heart catheterization with coronary angiogram N/A 05/24/2012    Procedure: LEFT HEART CATHETERIZATION WITH CORONARY ANGIOGRAM;  Surgeon: Burnell Blanks, MD;  Location: [facility] CATH LAB;  Service: Cardiovascular;  Laterality: N/A;  . Percutaneous coronary intervention-balloon only  05/24/2012    Procedure: PERCUTANEOUS CORONARY INTERVENTION-BALLOON ONLY;  Surgeon: Burnell Blanks, MD;  Location: [facility] CATH LAB;  Service: Cardiovascular;;    Current Outpatient Prescriptions  Medication Sig Dispense Refill  . amLODipine (NORVASC) 5 MG tablet TAKE 1 TABLET ONCE DAILY. 90 tablet 3  . atorvastatin (LIPITOR) 20 MG tablet TAKE 1 TABLET ONCE DAILY. 90 tablet 3  . Cholecalciferol (VITAMIN D-3) 5000 UNITS TABS Take 5,000 Units by mouth daily.     . Coenzyme Q10 (CO Q 10) 100 MG CAPS Take 1 capsule by mouth every morning.     Marland Kitchen ibuprofen (ADVIL,MOTRIN) 200 MG tablet Take 400 mg by mouth every 6 (six) hours as needed for pain.    Marland Kitchen LORazepam (ATIVAN) 1  MG tablet Take 1 mg by mouth at bedtime.     . metoprolol succinate (TOPROL-XL) 50 MG 24 hr tablet TAKE 1 TABLET DAILY AFTER A MEAL. 30 tablet 5  . nitroGLYCERIN (NITROSTAT) 0.4 MG SL tablet Place 1 tablet (0.4 mg total) under the tongue every 5 (five) minutes x 3 doses as needed for chest pain. 30 tablet 4  . omega-3 acid ethyl esters (LOVAZA) 1 G capsule Take 1 g by mouth daily.    . ramipril (ALTACE) 10 MG capsule Take 10 mg by mouth 2 (two) times daily.    . Ranibizumab (LUCENTIS) 0.5 MG/0.05ML SOLN Inject 0.5 mg into the eye See admin instructions. Pt gets this injection every 9 weeks. Next treatment is due on 01-18-12. Pt is followed by [facility].  Gets 1 shot at a time, one week apart.    . warfarin (COUMADIN) 5 MG tablet TAKE AS DIRECTED BY COUMADIN CLINIC. 40 tablet 3  . hydrochlorothiazide (MICROZIDE) 12.5 MG capsule TAKE (1) CAPSULE DAILY. 30 capsule 11  . isosorbide mononitrate (IMDUR) 60 MG 24 hr tablet TAKE 1 TABLET ONCE DAILY. 30 tablet 11   No current facility-administered medications for this visit.    Allergies:   Review of patient's allergies indicates no known allergies.   Social History:  The patient  reports that he quit smoking about 31 years ago. His smoking use included Cigarettes. He has never used smokeless tobacco. He reports that he drinks about 4.2 oz of alcohol per week. He reports that he does not use illicit drugs.   Family History:  The patient's  family history includes Heart attack (age of onset: 6) in his mother.    ROS:  Please see the history of present illness.  Otherwise, review of systems is positive for leg swelling.  All other systems are reviewed and negative.    PHYSICAL EXAM: VS:  BP 130/70 mmHg  Pulse 62  Ht 5\' 9"  (1.753 m)  Wt 189 lb 1.9 oz (85.784 kg)  BMI 27.92 kg/m2 , BMI Body mass index is 27.92 kg/(m^2). GEN: Well nourished, well developed, in no acute distress HEENT: normal Neck: no JVD, no masses. No carotid  bruits Cardiac: irregularly irregular with 2/6 mid-peaking harsh systolic murmur at the RUSB             Respiratory:  clear to auscultation bilaterally, normal work of breathing GI: soft, nontender, nondistended, + BS MS: no deformity or atrophy Ext: 1+ left pretibial edema and trace right pretibial edema, pedal pulses 2+= bilaterally Skin: warm and dry, no rash Neuro:  Strength and sensation are intact Psych: euthymic mood, full affect  EKG:  EKG is ordered today. The  ekg ordered today shows atrial fibrillation with RBBB, HR 62 bpm  Recent Labs: 09/27/2013: BUN 25*; Creatinine, Ser 1.45*; Hemoglobin 15.0; Platelets 120*; Potassium 4.3; Sodium 136* 10/03/2013: ALT 31   Lipid Panel     Component Value Date/Time   CHOL 126 10/03/2013 1009   TRIG 58.0 10/03/2013 1009   HDL 45.50 10/03/2013 1009   CHOLHDL 3 10/03/2013 1009   VLDL 11.6 10/03/2013 1009   LDLCALC 69 10/03/2013 1009    Wt Readings from Last 3 Encounters:  08/19/14 189 lb 1.9 oz (85.784 kg)  09/27/13 195 lb (88.451 kg)  07/24/13 197 lb (89.359 kg)     Cardiac Studies Reviewed: 2D Echo 07/17/2013: Study Conclusions  - Left ventricle: Wall thickness was increased in a pattern of mild LVH. There was focal basal hypertrophy. Systolic function was normal. The estimated ejection fraction was in the range of 60% to 65%. Wall motion was normal; there were no regional wall motion abnormalities. - Aortic valve: There was mild to moderate stenosis. - Mitral valve: There was mild regurgitation. - Left atrium: The atrium was moderately to severely dilated. - Right atrium: The atrium was moderately dilated.  ASSESSMENT AND PLAN: 1.  CAD, native vessel: no anginal symptoms with activity. Will continue same Rx. Medications reviewed today and include metoprolol and imdur.  2. Atrial fibrillation, chronic: continue anticoagulation with warfarin which he has tolerated for many years. Heart rate controlled.  3. Aortic  stenosis, moderate: asymptomatic. Plan on yearly echo follow-up. Symptoms of AS discussed with the patient today.   4. Essential HTN: BP well-controlled  5. Hyperlipidemia: most recent lipids as above. Continue atorvastatin. Dr Reynaldo Minium follows labwork.  Current medicines are reviewed with the patient today.  The patient does not have concerns regarding medicines.  Labs/ tests ordered today include:   Orders Placed This Encounter  Procedures  . EKG 12-Lead    Disposition:   FU one year unless problems arise  Signed, Sherren Mocha, MD  08/21/2014 7:07 PM    [address], [place], [place]  [address] Phone: [phone]; Fax: [phone]

## 2014-08-19 NOTE — Patient Instructions (Signed)
Medication Instructions:  Your physician recommends that you continue on your current medications as directed. Please refer to the Current Medication list given to you today.  Labwork: We will request your recent lab results from Dr Reynaldo Minium.   Testing/Procedures: Your physician has requested that you have an echocardiogram in 1 YEAR. Echocardiography is a painless test that uses sound waves to create images of your heart. It provides your doctor with information about the size and shape of your heart and how well your heart's chambers and valves are working. This procedure takes approximately one hour. There are no restrictions for this procedure.  Follow-Up: Your physician wants you to follow-up in: 1 YEAR with Dr Burt Knack.  You will receive a reminder letter in the mail two months in advance. If you don't receive a letter, please call our office to schedule the follow-up appointment.   Any Other Special Instructions Will Be Listed Below (If Applicable).

## 2014-08-21 ENCOUNTER — Encounter: Payer: Self-pay | Admitting: Cardiovascular Disease

## 2014-08-24 ENCOUNTER — Encounter: Payer: Self-pay | Admitting: Cardiovascular Disease

## 2014-08-27 ENCOUNTER — Ambulatory Visit (HOSPITAL_COMMUNITY): Payer: Medicare Other | Attending: Cardiology

## 2014-08-27 ENCOUNTER — Other Ambulatory Visit: Payer: Self-pay

## 2014-08-27 ENCOUNTER — Other Ambulatory Visit (HOSPITAL_COMMUNITY): Payer: Medicare Other

## 2014-08-27 DIAGNOSIS — I341 Nonrheumatic mitral (valve) prolapse: Secondary | ICD-10-CM | POA: Diagnosis not present

## 2014-08-27 DIAGNOSIS — I371 Nonrheumatic pulmonary valve insufficiency: Secondary | ICD-10-CM | POA: Diagnosis not present

## 2014-08-27 DIAGNOSIS — I350 Nonrheumatic aortic (valve) stenosis: Secondary | ICD-10-CM | POA: Insufficient documentation

## 2014-08-27 DIAGNOSIS — I340 Nonrheumatic mitral (valve) insufficiency: Secondary | ICD-10-CM | POA: Diagnosis not present

## 2014-08-27 DIAGNOSIS — I071 Rheumatic tricuspid insufficiency: Secondary | ICD-10-CM | POA: Diagnosis not present

## 2014-09-01 ENCOUNTER — Encounter: Payer: Self-pay | Admitting: Cardiovascular Disease

## 2014-09-03 DIAGNOSIS — H3532 Exudative age-related macular degeneration: Secondary | ICD-10-CM | POA: Diagnosis not present

## 2014-09-09 ENCOUNTER — Other Ambulatory Visit: Payer: Self-pay

## 2014-09-09 MED ORDER — ATORVASTATIN CALCIUM 20 MG PO TABS: 20.0000 mg | ORAL_TABLET | Freq: Every day | ORAL | Status: DC

## 2014-09-10 ENCOUNTER — Other Ambulatory Visit: Payer: Self-pay | Admitting: *Deleted

## 2014-09-10 MED ORDER — WARFARIN SODIUM 5 MG PO TABS: 5.0000 mg | ORAL_TABLET | ORAL | Status: DC

## 2014-09-14 ENCOUNTER — Other Ambulatory Visit: Payer: Self-pay | Admitting: Cardiovascular Disease

## 2014-09-18 ENCOUNTER — Ambulatory Visit (INDEPENDENT_AMBULATORY_CARE_PROVIDER_SITE_OTHER): Payer: Medicare Other | Admitting: *Deleted

## 2014-09-18 DIAGNOSIS — I4891 Unspecified atrial fibrillation: Secondary | ICD-10-CM | POA: Diagnosis not present

## 2014-09-18 DIAGNOSIS — Z5181 Encounter for therapeutic drug level monitoring: Secondary | ICD-10-CM

## 2014-09-18 LAB — POCT INR: INR: 2.5

## 2014-10-08 DIAGNOSIS — H3532 Exudative age-related macular degeneration: Secondary | ICD-10-CM | POA: Diagnosis not present

## 2014-10-08 DIAGNOSIS — Z961 Presence of intraocular lens: Secondary | ICD-10-CM | POA: Diagnosis not present

## 2014-10-30 ENCOUNTER — Ambulatory Visit (INDEPENDENT_AMBULATORY_CARE_PROVIDER_SITE_OTHER): Payer: Medicare Other | Admitting: Pharmacist

## 2014-10-30 DIAGNOSIS — L821 Other seborrheic keratosis: Secondary | ICD-10-CM | POA: Diagnosis not present

## 2014-10-30 DIAGNOSIS — I4891 Unspecified atrial fibrillation: Secondary | ICD-10-CM

## 2014-10-30 DIAGNOSIS — D1801 Hemangioma of skin and subcutaneous tissue: Secondary | ICD-10-CM | POA: Diagnosis not present

## 2014-10-30 DIAGNOSIS — Z85828 Personal history of other malignant neoplasm of skin: Secondary | ICD-10-CM | POA: Diagnosis not present

## 2014-10-30 DIAGNOSIS — L570 Actinic keratosis: Secondary | ICD-10-CM | POA: Diagnosis not present

## 2014-10-30 DIAGNOSIS — Z5181 Encounter for therapeutic drug level monitoring: Secondary | ICD-10-CM | POA: Diagnosis not present

## 2014-10-30 DIAGNOSIS — L720 Epidermal cyst: Secondary | ICD-10-CM | POA: Diagnosis not present

## 2014-10-30 LAB — POCT INR: INR: 2.1

## 2014-10-30 IMAGING — CT CT ABD-PELV W/ CM
1 of 3 series · 14 of 32 positions shown, 19 images · IV contrast (omnipaque)
Comparison: CT 12/03/2012

CLINICAL DATA: Left flank pain.  History of kidney stones.

EXAM:
CT ABDOMEN AND PELVIS WITH CONTRAST
TECHNIQUE: Multidetector CT imaging of the abdomen and pelvis was performed
using the standard protocol following bolus administration of
intravenous contrast.
CONTRAST:  100mL OMNIPAQUE IOHEXOL 300 MG/ML  SOLN

[Series 2: abd/pel with · axial · 0.81mm/px · z∈[-506,-81]mm · 14 of 97 slices shown, 19 images]
[im 6/97  soft-tissue]
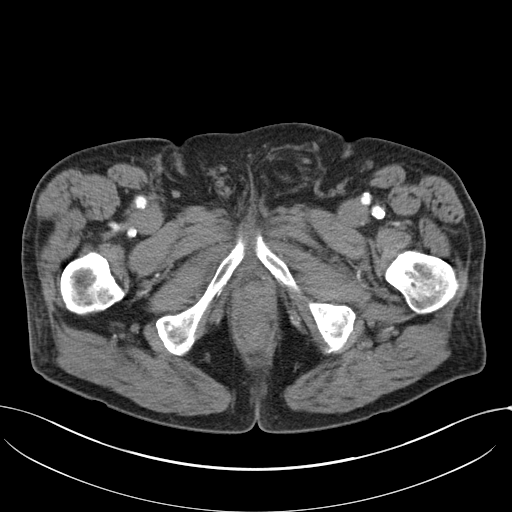
[im 6/97  bone]
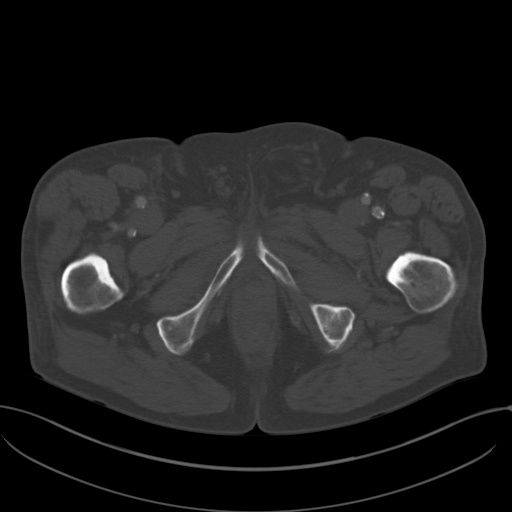
[im 11/97  soft-tissue]
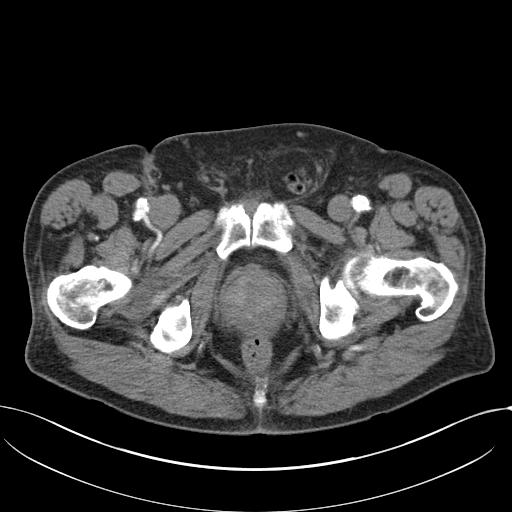
[im 22/97  soft-tissue]
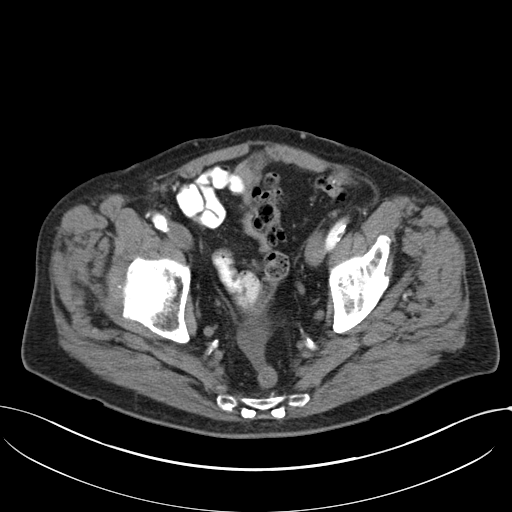
[im 27/97  soft-tissue]
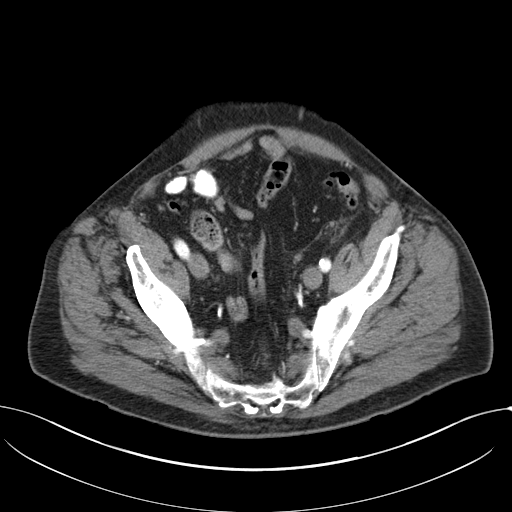
[im 33/97  soft-tissue]
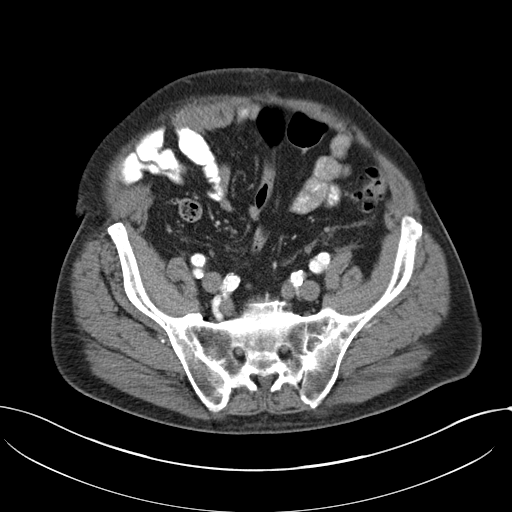
[im 43/97  soft-tissue]
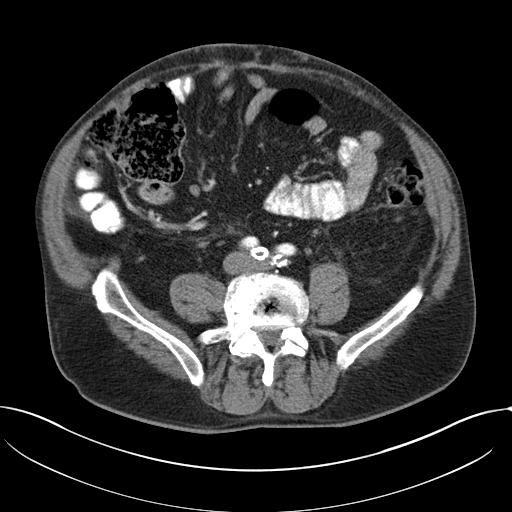
[im 49/97  soft-tissue]
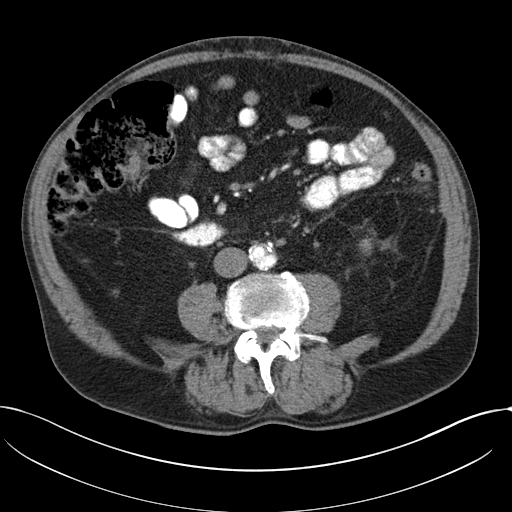
[im 54/97  soft-tissue]
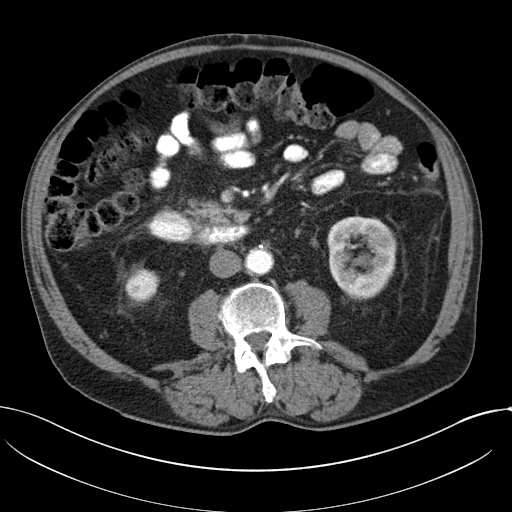
[im 65/97  soft-tissue]
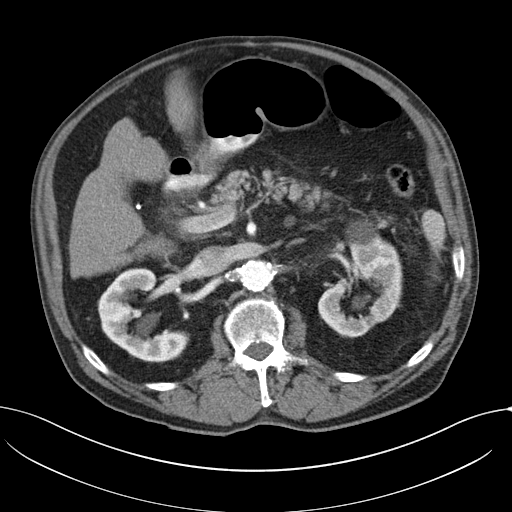
[im 65/97  bone]
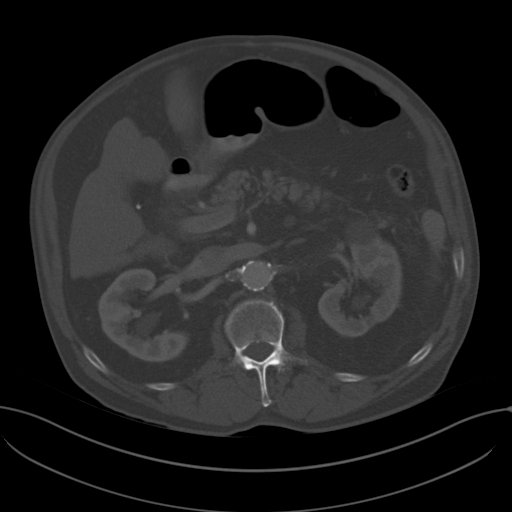
[im 70/97  soft-tissue]
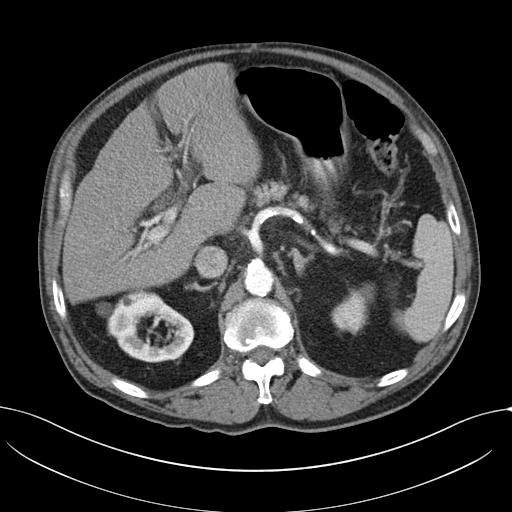
[im 75/97  soft-tissue]
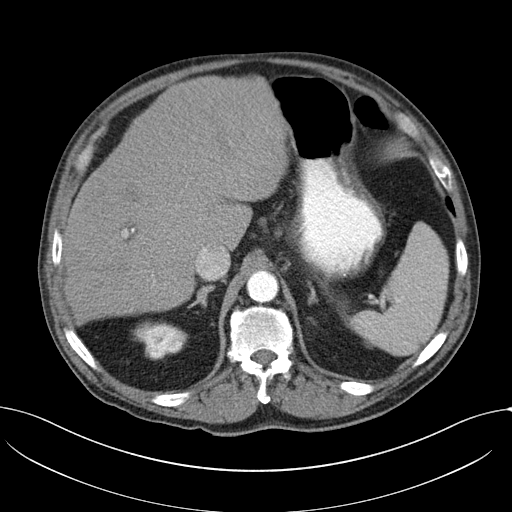
[im 75/97  lung]
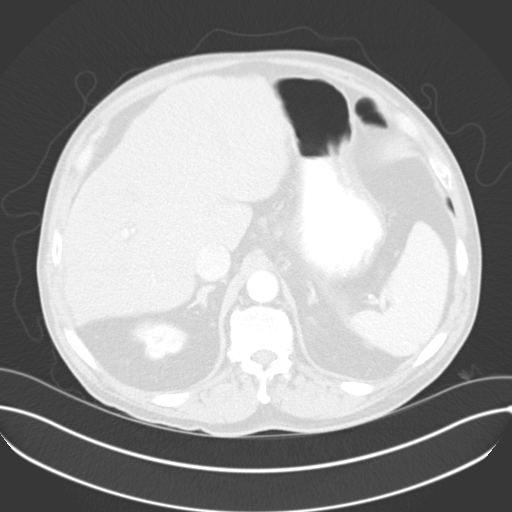
[im 81/97  lung]
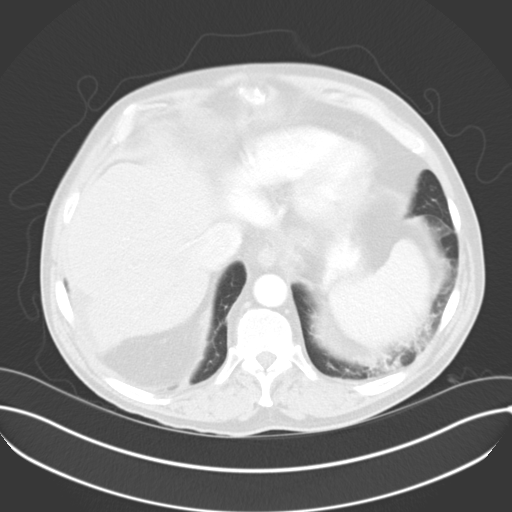
[im 86/97  soft-tissue]
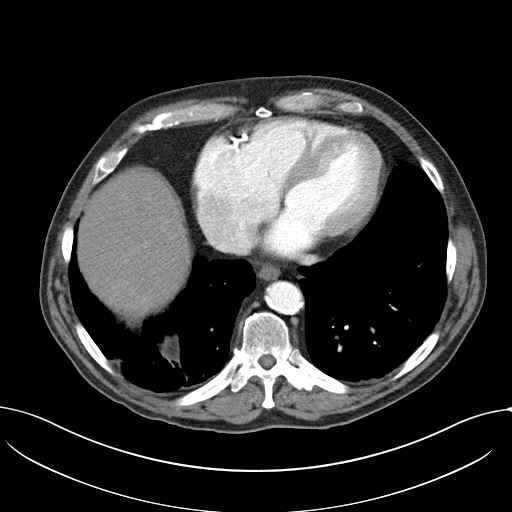
[im 86/97  lung]
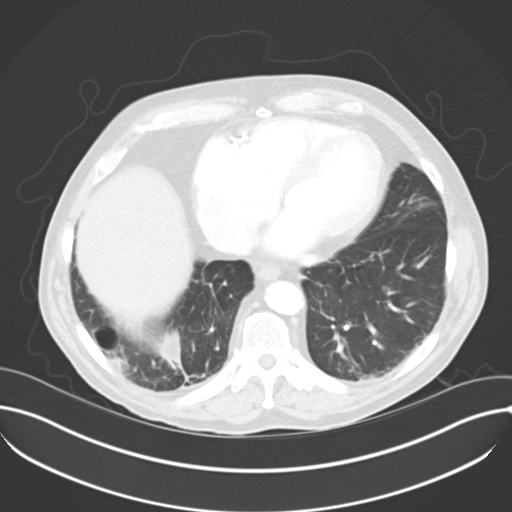
[im 91/97  soft-tissue]
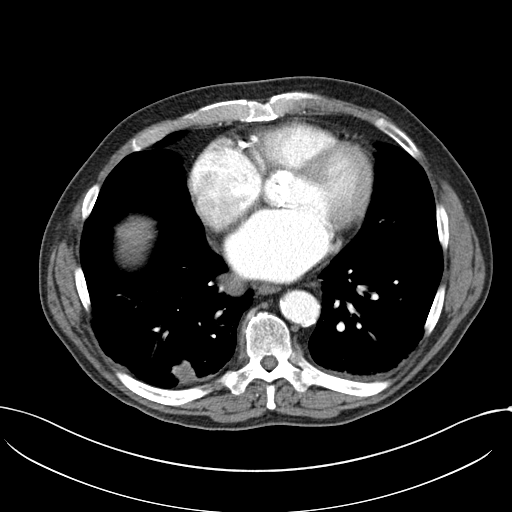
[im 91/97  lung]
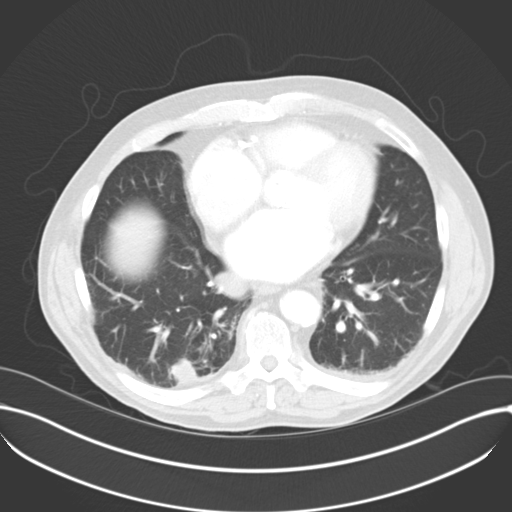

[14 of 32 positions shown; findings below may reference images not displayed]

FINDINGS: Linear opacities in the lung bases, likely scarring and/or
atelectasis. Heart is borderline in size. Extensive coronary artery
calcifications. No pleural effusions.

Prior cholecystectomy. Liver, spleen, pancreas, adrenals are
unremarkable. Bilateral renal cysts present. Slight left
hydronephrosis. Punctate 1-2 mm left UVJ stone. No hydronephrosis or
ureteral stones on the right.

Prostate is enlarged.  Urinary bladder is unremarkable.

Small amount of free fluid in the cul-de-sac of the pelvis of
unknown etiology. Descending colonic and sigmoid diverticulosis. No
active diverticulitis. Small bowel is decompressed, grossly
unremarkable. Appendix is normal. Stomach unremarkable.

Left inguinal hernia containing a portion of sigmoid colon. No
obstruction.

Prior aortic bypass. Diffuse atherosclerotic change throughout the
aorta.

Degenerative changes in the lumbar spine.
IMPRESSION: 1-2 mm left UVJ stone with mild left hydronephrosis.

Bilateral renal cysts.

Left colonic diverticulosis.

Small amount of free fluid in the pelvis.

Prior aortic bypass.

## 2014-11-12 DIAGNOSIS — H3532 Exudative age-related macular degeneration: Secondary | ICD-10-CM | POA: Diagnosis not present

## 2014-12-09 DIAGNOSIS — Z23 Encounter for immunization: Secondary | ICD-10-CM | POA: Diagnosis not present

## 2014-12-11 ENCOUNTER — Ambulatory Visit (INDEPENDENT_AMBULATORY_CARE_PROVIDER_SITE_OTHER): Payer: Medicare Other | Admitting: Pharmacist

## 2014-12-11 DIAGNOSIS — I4891 Unspecified atrial fibrillation: Secondary | ICD-10-CM | POA: Diagnosis not present

## 2014-12-11 DIAGNOSIS — Z5181 Encounter for therapeutic drug level monitoring: Secondary | ICD-10-CM | POA: Diagnosis not present

## 2014-12-11 LAB — POCT INR: INR: 3

## 2014-12-22 DIAGNOSIS — H353231 Exudative age-related macular degeneration, bilateral, with active choroidal neovascularization: Secondary | ICD-10-CM | POA: Diagnosis not present

## 2014-12-30 DIAGNOSIS — L821 Other seborrheic keratosis: Secondary | ICD-10-CM | POA: Diagnosis not present

## 2014-12-30 DIAGNOSIS — D225 Melanocytic nevi of trunk: Secondary | ICD-10-CM | POA: Diagnosis not present

## 2014-12-30 DIAGNOSIS — Z85828 Personal history of other malignant neoplasm of skin: Secondary | ICD-10-CM | POA: Diagnosis not present

## 2014-12-30 DIAGNOSIS — L570 Actinic keratosis: Secondary | ICD-10-CM | POA: Diagnosis not present

## 2014-12-30 DIAGNOSIS — D1801 Hemangioma of skin and subcutaneous tissue: Secondary | ICD-10-CM | POA: Diagnosis not present

## 2015-01-22 ENCOUNTER — Ambulatory Visit (INDEPENDENT_AMBULATORY_CARE_PROVIDER_SITE_OTHER): Payer: Medicare Other | Admitting: *Deleted

## 2015-01-22 DIAGNOSIS — Z5181 Encounter for therapeutic drug level monitoring: Secondary | ICD-10-CM | POA: Diagnosis not present

## 2015-01-22 DIAGNOSIS — I4891 Unspecified atrial fibrillation: Secondary | ICD-10-CM | POA: Diagnosis not present

## 2015-01-22 LAB — POCT INR: INR: 2.9

## 2015-01-29 ENCOUNTER — Other Ambulatory Visit: Payer: Self-pay | Admitting: Cardiovascular Disease

## 2015-02-04 DIAGNOSIS — H353231 Exudative age-related macular degeneration, bilateral, with active choroidal neovascularization: Secondary | ICD-10-CM | POA: Diagnosis not present

## 2015-02-05 DIAGNOSIS — N401 Enlarged prostate with lower urinary tract symptoms: Secondary | ICD-10-CM | POA: Diagnosis not present

## 2015-02-05 DIAGNOSIS — R350 Frequency of micturition: Secondary | ICD-10-CM | POA: Diagnosis not present

## 2015-02-05 DIAGNOSIS — N138 Other obstructive and reflux uropathy: Secondary | ICD-10-CM | POA: Diagnosis not present

## 2015-02-24 DIAGNOSIS — Z7901 Long term (current) use of anticoagulants: Secondary | ICD-10-CM | POA: Diagnosis not present

## 2015-02-24 DIAGNOSIS — I739 Peripheral vascular disease, unspecified: Secondary | ICD-10-CM | POA: Diagnosis not present

## 2015-02-24 DIAGNOSIS — Z1389 Encounter for screening for other disorder: Secondary | ICD-10-CM | POA: Diagnosis not present

## 2015-02-24 DIAGNOSIS — I482 Chronic atrial fibrillation: Secondary | ICD-10-CM | POA: Diagnosis not present

## 2015-02-24 DIAGNOSIS — E784 Other hyperlipidemia: Secondary | ICD-10-CM | POA: Diagnosis not present

## 2015-02-24 DIAGNOSIS — Z6828 Body mass index (BMI) 28.0-28.9, adult: Secondary | ICD-10-CM | POA: Diagnosis not present

## 2015-02-24 DIAGNOSIS — I10 Essential (primary) hypertension: Secondary | ICD-10-CM | POA: Diagnosis not present

## 2015-02-24 DIAGNOSIS — F419 Anxiety disorder, unspecified: Secondary | ICD-10-CM | POA: Diagnosis not present

## 2015-02-24 DIAGNOSIS — I2510 Atherosclerotic heart disease of native coronary artery without angina pectoris: Secondary | ICD-10-CM | POA: Diagnosis not present

## 2015-02-24 DIAGNOSIS — D692 Other nonthrombocytopenic purpura: Secondary | ICD-10-CM | POA: Diagnosis not present

## 2015-02-24 DIAGNOSIS — M1990 Unspecified osteoarthritis, unspecified site: Secondary | ICD-10-CM | POA: Diagnosis not present

## 2015-03-04 ENCOUNTER — Ambulatory Visit (INDEPENDENT_AMBULATORY_CARE_PROVIDER_SITE_OTHER): Payer: Medicare Other | Admitting: *Deleted

## 2015-03-04 DIAGNOSIS — Z5181 Encounter for therapeutic drug level monitoring: Secondary | ICD-10-CM | POA: Diagnosis not present

## 2015-03-04 DIAGNOSIS — I4891 Unspecified atrial fibrillation: Secondary | ICD-10-CM

## 2015-03-04 LAB — POCT INR: INR: 2.6

## 2015-03-18 DIAGNOSIS — Z961 Presence of intraocular lens: Secondary | ICD-10-CM | POA: Diagnosis not present

## 2015-03-18 DIAGNOSIS — H353231 Exudative age-related macular degeneration, bilateral, with active choroidal neovascularization: Secondary | ICD-10-CM | POA: Diagnosis not present

## 2015-04-16 ENCOUNTER — Ambulatory Visit (INDEPENDENT_AMBULATORY_CARE_PROVIDER_SITE_OTHER): Payer: Medicare Other | Admitting: *Deleted

## 2015-04-16 DIAGNOSIS — Z5181 Encounter for therapeutic drug level monitoring: Secondary | ICD-10-CM | POA: Diagnosis not present

## 2015-04-16 DIAGNOSIS — I4891 Unspecified atrial fibrillation: Secondary | ICD-10-CM

## 2015-04-16 LAB — POCT INR: INR: 1.8

## 2015-04-29 DIAGNOSIS — H353231 Exudative age-related macular degeneration, bilateral, with active choroidal neovascularization: Secondary | ICD-10-CM | POA: Diagnosis not present

## 2015-05-21 ENCOUNTER — Ambulatory Visit (INDEPENDENT_AMBULATORY_CARE_PROVIDER_SITE_OTHER): Payer: Medicare Other | Admitting: Pharmacist

## 2015-05-21 DIAGNOSIS — Z5181 Encounter for therapeutic drug level monitoring: Secondary | ICD-10-CM | POA: Diagnosis not present

## 2015-05-21 DIAGNOSIS — I4891 Unspecified atrial fibrillation: Secondary | ICD-10-CM

## 2015-05-21 LAB — POCT INR: INR: 2.7

## 2015-06-10 DIAGNOSIS — H353231 Exudative age-related macular degeneration, bilateral, with active choroidal neovascularization: Secondary | ICD-10-CM | POA: Diagnosis not present

## 2015-07-02 ENCOUNTER — Ambulatory Visit (INDEPENDENT_AMBULATORY_CARE_PROVIDER_SITE_OTHER): Payer: Medicare Other | Admitting: *Deleted

## 2015-07-02 DIAGNOSIS — Z5181 Encounter for therapeutic drug level monitoring: Secondary | ICD-10-CM | POA: Diagnosis not present

## 2015-07-02 DIAGNOSIS — I4891 Unspecified atrial fibrillation: Secondary | ICD-10-CM

## 2015-07-02 LAB — POCT INR: INR: 3.2

## 2015-07-08 DIAGNOSIS — H353231 Exudative age-related macular degeneration, bilateral, with active choroidal neovascularization: Secondary | ICD-10-CM | POA: Diagnosis not present

## 2015-07-30 ENCOUNTER — Ambulatory Visit (INDEPENDENT_AMBULATORY_CARE_PROVIDER_SITE_OTHER): Payer: Medicare Other | Admitting: Pharmacist

## 2015-07-30 DIAGNOSIS — I4891 Unspecified atrial fibrillation: Secondary | ICD-10-CM | POA: Diagnosis not present

## 2015-07-30 DIAGNOSIS — Z5181 Encounter for therapeutic drug level monitoring: Secondary | ICD-10-CM

## 2015-07-30 LAB — POCT INR: INR: 3.5

## 2015-08-12 DIAGNOSIS — H353231 Exudative age-related macular degeneration, bilateral, with active choroidal neovascularization: Secondary | ICD-10-CM | POA: Diagnosis not present

## 2015-08-13 DIAGNOSIS — E784 Other hyperlipidemia: Secondary | ICD-10-CM | POA: Diagnosis not present

## 2015-08-13 DIAGNOSIS — I10 Essential (primary) hypertension: Secondary | ICD-10-CM | POA: Diagnosis not present

## 2015-08-13 DIAGNOSIS — Z125 Encounter for screening for malignant neoplasm of prostate: Secondary | ICD-10-CM | POA: Diagnosis not present

## 2015-08-20 ENCOUNTER — Ambulatory Visit (INDEPENDENT_AMBULATORY_CARE_PROVIDER_SITE_OTHER): Payer: Medicare Other | Admitting: Pharmacist

## 2015-08-20 DIAGNOSIS — I4891 Unspecified atrial fibrillation: Secondary | ICD-10-CM | POA: Diagnosis not present

## 2015-08-20 DIAGNOSIS — Z1212 Encounter for screening for malignant neoplasm of rectum: Secondary | ICD-10-CM | POA: Diagnosis not present

## 2015-08-20 DIAGNOSIS — Z5181 Encounter for therapeutic drug level monitoring: Secondary | ICD-10-CM | POA: Diagnosis not present

## 2015-08-20 LAB — POCT INR: INR: 2.6

## 2015-08-27 DIAGNOSIS — M1990 Unspecified osteoarthritis, unspecified site: Secondary | ICD-10-CM | POA: Diagnosis not present

## 2015-08-27 DIAGNOSIS — F419 Anxiety disorder, unspecified: Secondary | ICD-10-CM | POA: Diagnosis not present

## 2015-08-27 DIAGNOSIS — Z0000 Encounter for general adult medical examination without abnormal findings: Secondary | ICD-10-CM | POA: Diagnosis not present

## 2015-08-27 DIAGNOSIS — Z7901 Long term (current) use of anticoagulants: Secondary | ICD-10-CM | POA: Diagnosis not present

## 2015-08-27 DIAGNOSIS — I2510 Atherosclerotic heart disease of native coronary artery without angina pectoris: Secondary | ICD-10-CM | POA: Diagnosis not present

## 2015-08-27 DIAGNOSIS — I482 Chronic atrial fibrillation: Secondary | ICD-10-CM | POA: Diagnosis not present

## 2015-08-27 DIAGNOSIS — Z6828 Body mass index (BMI) 28.0-28.9, adult: Secondary | ICD-10-CM | POA: Diagnosis not present

## 2015-08-27 DIAGNOSIS — I10 Essential (primary) hypertension: Secondary | ICD-10-CM | POA: Diagnosis not present

## 2015-08-27 DIAGNOSIS — I739 Peripheral vascular disease, unspecified: Secondary | ICD-10-CM | POA: Diagnosis not present

## 2015-08-27 DIAGNOSIS — E784 Other hyperlipidemia: Secondary | ICD-10-CM | POA: Diagnosis not present

## 2015-08-27 DIAGNOSIS — D692 Other nonthrombocytopenic purpura: Secondary | ICD-10-CM | POA: Diagnosis not present

## 2015-08-29 ENCOUNTER — Other Ambulatory Visit: Payer: Self-pay | Admitting: Cardiovascular Disease

## 2015-09-11 ENCOUNTER — Other Ambulatory Visit: Payer: Self-pay | Admitting: Cardiovascular Disease

## 2015-09-17 ENCOUNTER — Ambulatory Visit (INDEPENDENT_AMBULATORY_CARE_PROVIDER_SITE_OTHER): Payer: Medicare Other | Admitting: Pharmacist

## 2015-09-17 ENCOUNTER — Other Ambulatory Visit: Payer: Self-pay

## 2015-09-17 DIAGNOSIS — I350 Nonrheumatic aortic (valve) stenosis: Secondary | ICD-10-CM

## 2015-09-17 DIAGNOSIS — Z5181 Encounter for therapeutic drug level monitoring: Secondary | ICD-10-CM | POA: Diagnosis not present

## 2015-09-17 DIAGNOSIS — I4891 Unspecified atrial fibrillation: Secondary | ICD-10-CM

## 2015-09-17 LAB — POCT INR: INR: 2.6

## 2015-09-23 DIAGNOSIS — H353231 Exudative age-related macular degeneration, bilateral, with active choroidal neovascularization: Secondary | ICD-10-CM | POA: Diagnosis not present

## 2015-09-30 ENCOUNTER — Other Ambulatory Visit: Payer: Self-pay

## 2015-09-30 ENCOUNTER — Ambulatory Visit (HOSPITAL_COMMUNITY): Payer: Medicare Other | Attending: Cardiovascular Disease

## 2015-09-30 DIAGNOSIS — I739 Peripheral vascular disease, unspecified: Secondary | ICD-10-CM | POA: Diagnosis not present

## 2015-09-30 DIAGNOSIS — I350 Nonrheumatic aortic (valve) stenosis: Secondary | ICD-10-CM | POA: Diagnosis not present

## 2015-09-30 DIAGNOSIS — I4891 Unspecified atrial fibrillation: Secondary | ICD-10-CM | POA: Insufficient documentation

## 2015-09-30 DIAGNOSIS — I119 Hypertensive heart disease without heart failure: Secondary | ICD-10-CM | POA: Insufficient documentation

## 2015-09-30 DIAGNOSIS — E785 Hyperlipidemia, unspecified: Secondary | ICD-10-CM | POA: Insufficient documentation

## 2015-09-30 DIAGNOSIS — Z8249 Family history of ischemic heart disease and other diseases of the circulatory system: Secondary | ICD-10-CM | POA: Insufficient documentation

## 2015-09-30 DIAGNOSIS — Z87891 Personal history of nicotine dependence: Secondary | ICD-10-CM | POA: Insufficient documentation

## 2015-09-30 DIAGNOSIS — I2510 Atherosclerotic heart disease of native coronary artery without angina pectoris: Secondary | ICD-10-CM | POA: Diagnosis not present

## 2015-09-30 DIAGNOSIS — I340 Nonrheumatic mitral (valve) insufficiency: Secondary | ICD-10-CM | POA: Diagnosis not present

## 2015-09-30 DIAGNOSIS — I495 Sick sinus syndrome: Secondary | ICD-10-CM | POA: Insufficient documentation

## 2015-10-01 ENCOUNTER — Other Ambulatory Visit (HOSPITAL_COMMUNITY): Payer: Medicare Other

## 2015-10-04 ENCOUNTER — Encounter: Payer: Self-pay | Admitting: Cardiovascular Disease

## 2015-10-04 ENCOUNTER — Ambulatory Visit (INDEPENDENT_AMBULATORY_CARE_PROVIDER_SITE_OTHER): Payer: Medicare Other | Admitting: Cardiovascular Disease

## 2015-10-04 VITALS — BP 140/64 | HR 58 | Ht 69.0 in | Wt 189.0 lb

## 2015-10-04 DIAGNOSIS — I6523 Occlusion and stenosis of bilateral carotid arteries: Secondary | ICD-10-CM

## 2015-10-04 DIAGNOSIS — I10 Essential (primary) hypertension: Secondary | ICD-10-CM | POA: Diagnosis not present

## 2015-10-04 DIAGNOSIS — I4891 Unspecified atrial fibrillation: Secondary | ICD-10-CM | POA: Diagnosis not present

## 2015-10-04 NOTE — Progress Notes (Signed)
Cardiology Office Note Date:  10/04/2015   ID:  Tyler Williams, DOB 18-May-1926, MRN [id number]  PCP:  Geoffery Lyons, MD  Cardiologist:  Sherren Mocha, MD    Chief Complaint  Patient presents with  . Aortic Stenosis    History of Present Illness: Tyler Williams is a 82 y.o. male who presents for follow-up of chronic atrial fibrillation on coumadin, CAD s/p PTCA in 1980s and 1990s, PAD, AAA repair, and AS. He was admitted 05/2012 with [place]. LHC demonstrated mid RCA 99%. PCI was attempted but unsuccessful and Med Rx was recommended. He's been more recently followed for aortic stenosis with yearly echo studies, but has remained asymptomatic.   He is walking for exercise on a regular basis at [employer]. He continues to play golf at times. He denies exertional symptoms of dyspnea, chest pain, chest pressure, or lightheadedness, or syncope. He complains of leg swelling which is been a chronic problem. He denies orthopnea or PND.   Past Medical History:  Diagnosis Date  . AAA (abdominal aortic aneurysm) ([place])    s/p repair  . Aortic stenosis, mild    a. mean gradient 17 mmHg on 04/2012 TTE  . Arthritis    knees  . CAD (coronary artery disease)    a. s/p multiple percutaneous prior coronary artery interventions b. 05/2012: unsuccessful PCI to tight RCA->medically managed  . Carotid artery disease ([facility])    0-39% bilateral ICA stenoses 11/2011  . Chronic anticoagulation    Coumadin  . GERD (gastroesophageal reflux disease)   . GERD (gastroesophageal reflux disease)   . History of pulmonary embolism    1964 related to dislocated hip on right   . History of skin cancer   . Hypercholesteremia   . Hypertension   . Hypertension   . Microscopic hematuria    Followed by urology  . Permanent atrial fibrillation ([place])   . PVD (peripheral vascular disease) ([place])    Bilateral renal artery atherosclerosis, SMA stenosis with collateralization  . RBBB    Chronic  . SSS (sick sinus  syndrome) [facility])     Past Surgical History:  Procedure Laterality Date  . ABDOMINAL AORTIC ANEURYSM REPAIR     1993   . CARDIAC CATHETERIZATION  05/24/2012   pD1 20%, oCFX 20%, mCFX 30%, ostial Int Br 30%, distal AV groove CFX 30%, pRCA 30-40%, mRCA 99%, dRCA 30%, PL branch small with diffuse 40%. Unable to pass wire past RCA lesion->medically managed  . CHOLECYSTECTOMY N/A 12/05/2012   Procedure: LAPAROSCOPIC CHOLECYSTECTOMY ;  Surgeon: Edward Jolly, MD;  Location: [facility];  Service: General;  Laterality: N/A;  . [place]  . EYE SURGERY     hx of cataract surgery   . [place]  . HERNIA REPAIR     right inguinal hernia repair   . JOINT REPLACEMENT     left knee replacement, partial right  . LEFT HEART CATHETERIZATION WITH CORONARY ANGIOGRAM N/A 05/24/2012   Procedure: LEFT HEART CATHETERIZATION WITH CORONARY ANGIOGRAM;  Surgeon: Burnell Blanks, MD;  Location: [facility] CATH LAB;  Service: Cardiovascular;  Laterality: N/A;  . ORTHOPEDIC SURGERY    . OTHER SURGICAL HISTORY     right knee popliteal aneurysm surgery stent placed   . OTHER SURGICAL HISTORY    . PARTIAL KNEE ARTHROPLASTY  01/22/2012   Procedure: UNICOMPARTMENTAL KNEE;  Surgeon: Mauri Pole, MD;  Location: [facility];  Service: Orthopedics;  Laterality: Right;  .  PERCUTANEOUS CORONARY INTERVENTION-BALLOON ONLY  05/24/2012   Procedure: PERCUTANEOUS CORONARY INTERVENTION-BALLOON ONLY;  Surgeon: Burnell Blanks, MD;  Location: [facility] CATH LAB;  Service: Cardiovascular;;  . TONSILLECTOMY      Current Outpatient Prescriptions  Medication Sig Dispense Refill  . amLODipine (NORVASC) 5 MG tablet Take 5 mg by mouth daily.    Marland Kitchen atorvastatin (LIPITOR) 20 MG tablet Take 20 mg by mouth daily.    . Cholecalciferol (VITAMIN D-3) 5000 UNITS TABS Take 5,000 Units by mouth daily.     . Coenzyme Q10 (CO Q 10) 100 MG CAPS Take 1 capsule by mouth every morning.     . hydrochlorothiazide  (MICROZIDE) 12.5 MG capsule Take 1 capsule (12.5 mg total) by mouth daily. 30 capsule 0  . ibuprofen (ADVIL,MOTRIN) 200 MG tablet Take 400 mg by mouth every 6 (six) hours as needed for pain.    . isosorbide mononitrate (IMDUR) 60 MG 24 hr tablet Take 60 mg by mouth daily.    Marland Kitchen LORazepam (ATIVAN) 1 MG tablet Take 1 mg by mouth at bedtime.     . metoprolol succinate (TOPROL-XL) 50 MG 24 hr tablet Take 50 mg by mouth 2 (two) times daily after a meal. Take with or immediately following a meal.    . nitroGLYCERIN (NITROSTAT) 0.4 MG SL tablet Place 1 tablet (0.4 mg total) under the tongue every 5 (five) minutes x 3 doses as needed for chest pain. 30 tablet 4  . omega-3 acid ethyl esters (LOVAZA) 1 G capsule Take 1 g by mouth daily.    . ramipril (ALTACE) 10 MG capsule Take 10 mg by mouth 2 (two) times daily.    . Ranibizumab (LUCENTIS) 0.5 MG/0.05ML SOLN Inject 0.5 mg into the eye See admin instructions. Pt gets this injection every 9 weeks. Next treatment is due on 01-18-12. Pt is followed by [facility].  Gets 1 shot at a time, one week apart.    . warfarin (COUMADIN) 5 MG tablet TAKE AS DIRECTED BY COUMADIN CLINIC. 40 tablet 3   No current facility-administered medications for this visit.     Allergies:   Review of patient's allergies indicates no known allergies.   Social History:  The patient  reports that he quit smoking about 32 years ago. His smoking use included Cigarettes. He has never used smokeless tobacco. He reports that he drinks about 4.2 oz of alcohol per week . He reports that he does not use drugs.   Family History:  The patient's  family history includes Heart attack (age of onset: 53) in his mother.    ROS:  Please see the history of present illness.  Otherwise, review of systems is positive for vision loss (macular degeneration). All other systems are reviewed and negative.    PHYSICAL EXAM: VS:  BP 140/64   Pulse (!) 58   Ht 5\' 9"  (1.753 m)   Wt 85.7 kg (189  lb)   BMI 27.91 kg/m  , BMI Body mass index is 27.91 kg/m. GEN: Well nourished, well developed, in no acute distress  HEENT: normal  Neck: no JVD, no masses. bilateral carotid bruits Cardiac: RRR with grade 3/6 mid-peaking harsh systolic murmur at the RUSB, preserved A2              Respiratory:  clear to auscultation bilaterally, normal work of breathing GI: soft, nontender, nondistended, + BS MS: no deformity or atrophy  Ext: chronic stasis changes, trace pretibial edema on the left and 1+ edema  on the right Skin: warm and dry, no rash Neuro:  Strength and sensation are intact Psych: euthymic mood, full affect  EKG:  EKG is ordered today. The ekg ordered today shows atrial flutter with variable AV block, right bundle branch block, heart rate 59 bpm, no significant change from previous tracings  Recent Labs: No results found for requested labs within last 8760 hours.   Lipid Panel     Component Value Date/Time   CHOL 126 10/03/2013 1009   TRIG 58.0 10/03/2013 1009   HDL 45.50 10/03/2013 1009   CHOLHDL 3 10/03/2013 1009   VLDL 11.6 10/03/2013 1009   LDLCALC 69 10/03/2013 1009      Wt Readings from Last 3 Encounters:  10/04/15 85.7 kg (189 lb)  08/19/14 85.8 kg (189 lb 1.9 oz)  09/27/13 88.5 kg (195 lb)     Cardiac Studies Reviewed: Left ventricle:  The cavity size was normal. There was mild concentric hypertrophy. Systolic function was normal. The estimated ejection fraction was in the range of 60% to 65%. The transmitral flow pattern was normal. The deceleration time of the early transmitral flow velocity was normal. The pulmonary vein flow pattern was normal. The tissue Doppler parameters were normal. Left ventricular diastolic function parameters were normal.  ------------------------------------------------------------------- Aortic valve:   Trileaflet; severely thickened, severely calcified leaflets.  Doppler:   There was moderate to severe stenosis. VTI  ratio of LVOT to aortic valve: 0.21. Valve area (VTI): 0.88 cm^2. Indexed valve area (VTI): 0.43 cm^2/m^2. Peak velocity ratio of LVOT to aortic valve: 0.2. Valve area (Vmax): 0.85 cm^2. Indexed valve area (Vmax): 0.41 cm^2/m^2. Mean velocity ratio of LVOT to aortic valve: 0.18. Valve area (Vmean): 0.74 cm^2. Indexed valve area (Vmean): 0.36 cm^2/m^2.    Mean gradient (S): 32 mm Hg. Peak gradient (S): 54 mm Hg.  ------------------------------------------------------------------- Aorta:  The aorta was normal, not dilated, and non-diseased.  ------------------------------------------------------------------- Mitral valve:   Calcified annulus. Mildly thickened leaflets . Doppler:  There was mild regurgitation.    Peak gradient (D): 5 mm Hg.  ------------------------------------------------------------------- Left atrium:  The atrium was moderately dilated.  ------------------------------------------------------------------- Atrial septum:  No defect or patent foramen ovale was identified.   ------------------------------------------------------------------- Pulmonic valve:    Doppler:  There was trivial regurgitation.  ------------------------------------------------------------------- Tricuspid valve:   Doppler:  There was mild regurgitation.  ------------------------------------------------------------------- Right atrium:  The atrium was normal in size.  ------------------------------------------------------------------- Pericardium:  The pericardium was normal in appearance.  ------------------------------------------------------------------- Systemic veins: Inferior vena cava: The vessel was normal in size. The respirophasic diameter changes were in the normal range (>= 50%), consistent with normal central venous pressure. Diameter: 25.5 mm.   ASSESSMENT AND PLAN: 1.  Aortic stenosis, moderate to severe, stage C. The patient remains asymptomatic. I would like to  see him back in 6 months. Will plan an echo next year. He is counseled about potential symptoms as well as treatment options for aortic stenosis.  2. Atrial fibrillation/flutter: The patient remains asymptomatic. He is tolerating warfarin. Heart rate is well controlled on metoprolol.  3. 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 He's had no dizziness or symptomatic rate cardiac. Continue to follow. Tolerating a beta blocker.  4. CAD, native vessel, with angina: No symptoms on a combination of isosorbide and long-acting metoprolol as well as amlodipine.  5. Hyperlipidemia: Continue atorvastatin  Current medicines are reviewed with the patient today.  The patient does not have concerns regarding  medicines.  Labs/ tests ordered today include:   Orders Placed This Encounter  Procedures  . EKG 12-Lead    Disposition:   FU 6 months  Signed, Sherren Mocha, MD  10/04/2015 1:03 PM    [facility] [place], [place], [place]  [address] Phone: [phone]; Fax: [phone]

## 2015-10-04 NOTE — Patient Instructions (Signed)
Medication Instructions:  Your physician recommends that you continue on your current medications as directed. Please refer to the Current Medication list given to you today.  Labwork: No new orders.   Testing/Procedures: Your physician has requested that you have a carotid duplex in October. This test is an ultrasound of the carotid arteries in your neck. It looks at blood flow through these arteries that supply the brain with blood. Allow one hour for this exam. There are no restrictions or special instructions.  Follow-Up: Your physician wants you to follow-up in: 6 MONTHS with Dr Burt Knack.  You will receive a reminder letter in the mail two months in advance. If you don't receive a letter, please call our office to schedule the follow-up appointment.   Any Other Special Instructions Will Be Listed Below (If Applicable).     If you need a refill on your cardiac medications before your next appointment, please call your pharmacy.

## 2015-10-05 ENCOUNTER — Other Ambulatory Visit: Payer: Self-pay | Admitting: Cardiovascular Disease

## 2015-10-06 NOTE — Telephone Encounter (Signed)
Please advise on refill request as the patient does not have a recent lipid panel in epic. Thanks, MI

## 2015-10-13 ENCOUNTER — Other Ambulatory Visit: Payer: Self-pay | Admitting: Cardiovascular Disease

## 2015-10-15 ENCOUNTER — Ambulatory Visit (INDEPENDENT_AMBULATORY_CARE_PROVIDER_SITE_OTHER): Payer: Medicare Other | Admitting: Pharmacist

## 2015-10-15 DIAGNOSIS — I4891 Unspecified atrial fibrillation: Secondary | ICD-10-CM | POA: Diagnosis not present

## 2015-10-15 DIAGNOSIS — Z5181 Encounter for therapeutic drug level monitoring: Secondary | ICD-10-CM | POA: Diagnosis not present

## 2015-10-15 LAB — POCT INR: INR: 2.6

## 2015-10-22 DIAGNOSIS — L570 Actinic keratosis: Secondary | ICD-10-CM | POA: Diagnosis not present

## 2015-10-22 DIAGNOSIS — L819 Disorder of pigmentation, unspecified: Secondary | ICD-10-CM | POA: Diagnosis not present

## 2015-10-22 DIAGNOSIS — L821 Other seborrheic keratosis: Secondary | ICD-10-CM | POA: Diagnosis not present

## 2015-10-22 DIAGNOSIS — Z85828 Personal history of other malignant neoplasm of skin: Secondary | ICD-10-CM | POA: Diagnosis not present

## 2015-10-30 DIAGNOSIS — Z23 Encounter for immunization: Secondary | ICD-10-CM | POA: Diagnosis not present

## 2015-11-04 DIAGNOSIS — Z961 Presence of intraocular lens: Secondary | ICD-10-CM | POA: Diagnosis not present

## 2015-11-04 DIAGNOSIS — H353221 Exudative age-related macular degeneration, left eye, with active choroidal neovascularization: Secondary | ICD-10-CM | POA: Diagnosis not present

## 2015-11-09 ENCOUNTER — Other Ambulatory Visit: Payer: Self-pay | Admitting: Cardiovascular Disease

## 2015-11-09 DIAGNOSIS — I6523 Occlusion and stenosis of bilateral carotid arteries: Secondary | ICD-10-CM

## 2015-11-29 ENCOUNTER — Ambulatory Visit (HOSPITAL_COMMUNITY)
Admission: RE | Admit: 2015-11-29 | Discharge: 2015-11-29 | Disposition: A | Discharge status: Home / Self Care | Payer: Medicare Other | Source: Ambulatory Visit | Attending: Internal Medicine | Admitting: Internal Medicine

## 2015-11-29 ENCOUNTER — Ambulatory Visit (INDEPENDENT_AMBULATORY_CARE_PROVIDER_SITE_OTHER): Payer: Medicare Other | Admitting: Pharmacist

## 2015-11-29 DIAGNOSIS — I4891 Unspecified atrial fibrillation: Secondary | ICD-10-CM | POA: Diagnosis not present

## 2015-11-29 DIAGNOSIS — I6523 Occlusion and stenosis of bilateral carotid arteries: Secondary | ICD-10-CM

## 2015-11-29 DIAGNOSIS — Z5181 Encounter for therapeutic drug level monitoring: Secondary | ICD-10-CM

## 2015-11-29 LAB — POCT INR: INR: 2.5

## 2015-12-16 DIAGNOSIS — H353221 Exudative age-related macular degeneration, left eye, with active choroidal neovascularization: Secondary | ICD-10-CM | POA: Diagnosis not present

## 2016-01-14 ENCOUNTER — Ambulatory Visit (INDEPENDENT_AMBULATORY_CARE_PROVIDER_SITE_OTHER): Payer: Medicare Other

## 2016-01-14 DIAGNOSIS — I6523 Occlusion and stenosis of bilateral carotid arteries: Secondary | ICD-10-CM

## 2016-01-14 DIAGNOSIS — Z5181 Encounter for therapeutic drug level monitoring: Secondary | ICD-10-CM

## 2016-01-14 DIAGNOSIS — I4891 Unspecified atrial fibrillation: Secondary | ICD-10-CM

## 2016-01-14 LAB — POCT INR: INR: 2.6

## 2016-01-20 DIAGNOSIS — H353221 Exudative age-related macular degeneration, left eye, with active choroidal neovascularization: Secondary | ICD-10-CM | POA: Diagnosis not present

## 2016-01-20 DIAGNOSIS — H353231 Exudative age-related macular degeneration, bilateral, with active choroidal neovascularization: Secondary | ICD-10-CM | POA: Diagnosis not present

## 2016-01-20 DIAGNOSIS — H353211 Exudative age-related macular degeneration, right eye, with active choroidal neovascularization: Secondary | ICD-10-CM | POA: Diagnosis not present

## 2016-01-21 DIAGNOSIS — R350 Frequency of micturition: Secondary | ICD-10-CM | POA: Diagnosis not present

## 2016-02-11 DIAGNOSIS — Z96651 Presence of right artificial knee joint: Secondary | ICD-10-CM | POA: Diagnosis not present

## 2016-02-11 DIAGNOSIS — M25561 Pain in right knee: Secondary | ICD-10-CM | POA: Diagnosis not present

## 2016-02-11 DIAGNOSIS — Z471 Aftercare following joint replacement surgery: Secondary | ICD-10-CM | POA: Diagnosis not present

## 2016-02-16 DIAGNOSIS — F419 Anxiety disorder, unspecified: Secondary | ICD-10-CM | POA: Diagnosis not present

## 2016-02-16 DIAGNOSIS — D692 Other nonthrombocytopenic purpura: Secondary | ICD-10-CM | POA: Diagnosis not present

## 2016-02-16 DIAGNOSIS — Z6828 Body mass index (BMI) 28.0-28.9, adult: Secondary | ICD-10-CM | POA: Diagnosis not present

## 2016-02-16 DIAGNOSIS — I2510 Atherosclerotic heart disease of native coronary artery without angina pectoris: Secondary | ICD-10-CM | POA: Diagnosis not present

## 2016-02-16 DIAGNOSIS — I482 Chronic atrial fibrillation: Secondary | ICD-10-CM | POA: Diagnosis not present

## 2016-02-16 DIAGNOSIS — Z7901 Long term (current) use of anticoagulants: Secondary | ICD-10-CM | POA: Diagnosis not present

## 2016-02-16 DIAGNOSIS — M1990 Unspecified osteoarthritis, unspecified site: Secondary | ICD-10-CM | POA: Diagnosis not present

## 2016-02-16 DIAGNOSIS — M79604 Pain in right leg: Secondary | ICD-10-CM | POA: Diagnosis not present

## 2016-02-16 DIAGNOSIS — R600 Localized edema: Secondary | ICD-10-CM | POA: Diagnosis not present

## 2016-02-16 DIAGNOSIS — I739 Peripheral vascular disease, unspecified: Secondary | ICD-10-CM | POA: Diagnosis not present

## 2016-02-16 DIAGNOSIS — E784 Other hyperlipidemia: Secondary | ICD-10-CM | POA: Diagnosis not present

## 2016-02-16 DIAGNOSIS — I10 Essential (primary) hypertension: Secondary | ICD-10-CM | POA: Diagnosis not present

## 2016-02-17 ENCOUNTER — Ambulatory Visit: Payer: Medicare Other

## 2016-02-17 ENCOUNTER — Other Ambulatory Visit: Payer: Self-pay | Admitting: *Deleted

## 2016-02-17 ENCOUNTER — Ambulatory Visit (INDEPENDENT_AMBULATORY_CARE_PROVIDER_SITE_OTHER): Payer: Medicare Other | Admitting: Podiatry

## 2016-02-17 ENCOUNTER — Encounter: Payer: Self-pay | Admitting: Podiatry

## 2016-02-17 VITALS — BP 160/69 | HR 71 | Resp 16

## 2016-02-17 DIAGNOSIS — M722 Plantar fascial fibromatosis: Secondary | ICD-10-CM

## 2016-02-17 DIAGNOSIS — M79676 Pain in unspecified toe(s): Secondary | ICD-10-CM

## 2016-02-17 DIAGNOSIS — B351 Tinea unguium: Secondary | ICD-10-CM | POA: Diagnosis not present

## 2016-02-17 NOTE — Progress Notes (Signed)
   Subjective:    Patient ID: Tyler Williams, male    DOB: 1926/12/27, 83 y.o.   MRN: [id number]  HPI: He presents today to concern of elongated possibly ingrown toenails. States that they've been bothering him for some time and he would like for [place] to take a look at them. He states that he is unable to cut them regularly himself.    Review of Systems  Musculoskeletal: Positive for arthralgias.  All other systems reviewed and are negative.      Objective:   Physical Exam: Vital signs are stable he is alert and oriented 3. Pulses are palpable. Her logic sensorium is intact. Deep tendon reflexes are intact muscle strength is normal. Orthopedic evaluation demonstrates all joints distal to the ankle for range of motion without crepitation. Cutaneous evaluation demonstrate only thickened nails 1 through 5 bilaterally possibly mycotic painful in nature and on debridement. Mildly incurvated nail margins to the lesser toes both hallux nails have been previously resected with matrixectomy is along the borders.        Assessment & Plan:  Pain in limb secondary to thickened toenails.  Plan: Debridement of nails.

## 2016-02-24 DIAGNOSIS — I10 Essential (primary) hypertension: Secondary | ICD-10-CM | POA: Diagnosis not present

## 2016-02-24 DIAGNOSIS — I482 Chronic atrial fibrillation: Secondary | ICD-10-CM | POA: Diagnosis not present

## 2016-02-24 DIAGNOSIS — I739 Peripheral vascular disease, unspecified: Secondary | ICD-10-CM | POA: Diagnosis not present

## 2016-02-24 DIAGNOSIS — Z1389 Encounter for screening for other disorder: Secondary | ICD-10-CM | POA: Diagnosis not present

## 2016-02-24 DIAGNOSIS — K4090 Unilateral inguinal hernia, without obstruction or gangrene, not specified as recurrent: Secondary | ICD-10-CM | POA: Insufficient documentation

## 2016-02-24 DIAGNOSIS — D692 Other nonthrombocytopenic purpura: Secondary | ICD-10-CM | POA: Diagnosis not present

## 2016-02-24 DIAGNOSIS — M79661 Pain in right lower leg: Secondary | ICD-10-CM | POA: Diagnosis not present

## 2016-02-24 DIAGNOSIS — Z7901 Long term (current) use of anticoagulants: Secondary | ICD-10-CM | POA: Diagnosis not present

## 2016-02-24 DIAGNOSIS — M79604 Pain in right leg: Secondary | ICD-10-CM | POA: Diagnosis not present

## 2016-02-24 DIAGNOSIS — E784 Other hyperlipidemia: Secondary | ICD-10-CM | POA: Diagnosis not present

## 2016-02-24 DIAGNOSIS — F419 Anxiety disorder, unspecified: Secondary | ICD-10-CM | POA: Diagnosis not present

## 2016-02-24 DIAGNOSIS — I2510 Atherosclerotic heart disease of native coronary artery without angina pectoris: Secondary | ICD-10-CM | POA: Diagnosis not present

## 2016-02-24 DIAGNOSIS — Z6828 Body mass index (BMI) 28.0-28.9, adult: Secondary | ICD-10-CM | POA: Diagnosis not present

## 2016-02-24 DIAGNOSIS — R600 Localized edema: Secondary | ICD-10-CM | POA: Diagnosis not present

## 2016-02-25 ENCOUNTER — Ambulatory Visit (INDEPENDENT_AMBULATORY_CARE_PROVIDER_SITE_OTHER): Payer: Medicare Other

## 2016-02-25 DIAGNOSIS — Z5181 Encounter for therapeutic drug level monitoring: Secondary | ICD-10-CM | POA: Diagnosis not present

## 2016-02-25 DIAGNOSIS — I4891 Unspecified atrial fibrillation: Secondary | ICD-10-CM | POA: Diagnosis not present

## 2016-02-25 LAB — POCT INR: INR: 2.8

## 2016-03-07 DIAGNOSIS — H353231 Exudative age-related macular degeneration, bilateral, with active choroidal neovascularization: Secondary | ICD-10-CM | POA: Diagnosis not present

## 2016-04-04 DIAGNOSIS — H353231 Exudative age-related macular degeneration, bilateral, with active choroidal neovascularization: Secondary | ICD-10-CM | POA: Diagnosis not present

## 2016-04-07 ENCOUNTER — Ambulatory Visit (INDEPENDENT_AMBULATORY_CARE_PROVIDER_SITE_OTHER): Payer: Medicare Other | Admitting: *Deleted

## 2016-04-07 DIAGNOSIS — Z5181 Encounter for therapeutic drug level monitoring: Secondary | ICD-10-CM | POA: Diagnosis not present

## 2016-04-07 DIAGNOSIS — I4891 Unspecified atrial fibrillation: Secondary | ICD-10-CM

## 2016-04-07 LAB — POCT INR: INR: 1.8

## 2016-04-14 ENCOUNTER — Ambulatory Visit (INDEPENDENT_AMBULATORY_CARE_PROVIDER_SITE_OTHER): Payer: Medicare Other | Admitting: Cardiovascular Disease

## 2016-04-14 ENCOUNTER — Encounter: Payer: Self-pay | Admitting: Cardiovascular Disease

## 2016-04-14 VITALS — BP 150/70 | HR 64 | Ht 69.0 in | Wt 191.1 lb

## 2016-04-14 DIAGNOSIS — I350 Nonrheumatic aortic (valve) stenosis: Secondary | ICD-10-CM

## 2016-04-14 DIAGNOSIS — I6523 Occlusion and stenosis of bilateral carotid arteries: Secondary | ICD-10-CM | POA: Diagnosis not present

## 2016-04-14 DIAGNOSIS — I4891 Unspecified atrial fibrillation: Secondary | ICD-10-CM | POA: Diagnosis not present

## 2016-04-14 DIAGNOSIS — I10 Essential (primary) hypertension: Secondary | ICD-10-CM | POA: Diagnosis not present

## 2016-04-14 NOTE — Patient Instructions (Addendum)
Medication Instructions:  Your physician recommends that you continue on your current medications as directed. Please refer to the Current Medication list given to you today.  Labwork: No new orders.   Testing/Procedures: Your physician has requested that you have an echocardiogram in August. Echocardiography is a painless test that uses sound waves to create images of your heart. It provides your doctor with information about the size and shape of your heart and how well your heart's chambers and valves are working. This procedure takes approximately one hour. There are no restrictions for this procedure.  Follow-Up: Your physician wants you to follow-up in: AUGUST with Dr Burt Knack.  You will receive a reminder letter in the mail two months in advance. If you don't receive a letter, please call our office to schedule the follow-up appointment.   Any Other Special Instructions Will Be Listed Below (If Applicable).  Order given for compression stockings.    If you need a refill on your cardiac medications before your next appointment, please call your pharmacy.

## 2016-04-14 NOTE — Progress Notes (Signed)
 Cardiology Office Note Date:  04/15/2016   ID:  Tyler Williams, DOB 11/21/1926, MRN [id number]  PCP:  ARONSON,RICHARD A, MD  Cardiologist:  Mavric Cortright, MD    Chief Complaint  Patient presents with  . Shortness of Breath     History of Present Illness: Tyler Williams is a 83 y.o. male who presents for follow-up of chronic atrial fibrillation on coumadin, CAD s/p PTCA in 1980s and 1990s, PAD, AAA repair, and AS. He was admitted 05/2012 with [facility]. LHC demonstrated mid RCA 99%. PCI was attempted but unsuccessful and Med Rx was recommended. He's been more recently followed for aortic stenosis with yearly echo studies, but has remained asymptomatic.   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 He scored a touchdown against [employer].  The patient is here alone today. He has been doing fairly well. His biggest complaint relates to right knee and lower leg swelling. He had a partial knee replacement several years ago. His leg swelling improves with elevation. He has not been able to tolerate thigh-high compression stockings. He denies orthopnea, chest pain, PND, or lightheadedness. He recently had to hurry up a hill when he became short of breath with that. Symptoms resolved with rest. He denies shortness of breath with his normal activities. No heart palpitations.  Past Medical History:  Diagnosis Date  . AAA (abdominal aortic aneurysm) (HCC)    s/p repair  . Aortic stenosis, mild    a. mean gradient 17 mmHg on 04/2012 TTE  . Arthritis    knees  . CAD (coronary artery disease)    a. s/p multiple percutaneous prior coronary artery interventions b. 05/2012: unsuccessful PCI to tight RCA->medically managed  . Carotid artery disease (HCC)    0-39% bilateral ICA stenoses 11/2011  . Chronic anticoagulation    Coumadin  . GERD (gastroesophageal reflux disease)   . GERD (gastroesophageal reflux disease)   . History of  pulmonary embolism    1964 related to dislocated hip on right   . History of skin cancer   . Hypercholesteremia   . Hypertension   . Hypertension   . Microscopic hematuria    Followed by urology  . Permanent atrial fibrillation (HCC)   . PVD (peripheral vascular disease) (HCC)    Bilateral renal artery atherosclerosis, SMA stenosis with collateralization  . RBBB    Chronic  . SSS (sick sinus syndrome) (HCC)     Past Surgical History:  Procedure Laterality Date  . ABDOMINAL AORTIC ANEURYSM REPAIR     1993   . CARDIAC CATHETERIZATION  05/24/2012   pD1 20%, oCFX 20%, mCFX 30%, ostial Int Br 30%, distal AV groove CFX 30%, pRCA 30-40%, mRCA 99%, dRCA 30%, PL branch small with diffuse 40%. Unable to pass wire past RCA lesion->medically managed  . CHOLECYSTECTOMY N/A 12/05/2012   Procedure: LAPAROSCOPIC CHOLECYSTECTOMY ;  Surgeon: Benjamin T Hoxworth, MD;  Location: [facility] OR;  Service: General;  Laterality: N/A;  . CORONARY ANGIOPLASTY  1980, 1990  . EYE SURGERY     hx of cataract surgery   . HEMORRHOID SURGERY     1973  . HERNIA REPAIR     right inguinal hernia repair   . JOINT REPLACEMENT     left knee replacement, partial right  . LEFT HEART CATHETERIZATION WITH CORONARY ANGIOGRAM N/A 05/24/2012   Procedure: LEFT HEART CATHETERIZATION WITH CORONARY ANGIOGRAM;  Surgeon: Christopher D McAlhany,   MD;  Location: [facility] CATH LAB;  Service: Cardiovascular;  Laterality: N/A;  . ORTHOPEDIC SURGERY    . OTHER SURGICAL HISTORY     right knee popliteal aneurysm surgery stent placed   . OTHER SURGICAL HISTORY    . PARTIAL KNEE ARTHROPLASTY  01/22/2012   Procedure: UNICOMPARTMENTAL KNEE;  Surgeon: Matthew D Olin, MD;  Location: [facility] ORS;  Service: Orthopedics;  Laterality: Right;  . PERCUTANEOUS CORONARY INTERVENTION-BALLOON ONLY  05/24/2012   Procedure: PERCUTANEOUS CORONARY INTERVENTION-BALLOON ONLY;  Surgeon: Christopher D McAlhany, MD;  Location: [facility] CATH LAB;  Service: Cardiovascular;;  .  TONSILLECTOMY      Current Outpatient Prescriptions  Medication Sig Dispense Refill  . amLODipine (NORVASC) 5 MG tablet Take 5 mg by mouth daily.    . atorvastatin (LIPITOR) 20 MG tablet TAKE 1 TABLET ONCE DAILY. 30 tablet 11  . Cholecalciferol (VITAMIN D-3) 5000 UNITS TABS Take 5,000 Units by mouth daily.     . Coenzyme Q10 (CO Q 10) 100 MG CAPS Take 1 capsule by mouth every morning.     . hydrochlorothiazide (MICROZIDE) 12.5 MG capsule TAKE (1) CAPSULE DAILY. 30 capsule 11  . ibuprofen (ADVIL,MOTRIN) 200 MG tablet Take 400 mg by mouth every 6 (six) hours as needed for pain.    . isosorbide mononitrate (IMDUR) 60 MG 24 hr tablet TAKE 1 TABLET EACH DAY. 30 tablet 11  . LORazepam (ATIVAN) 1 MG tablet Take 1 mg by mouth at bedtime.     . metoprolol succinate (TOPROL-XL) 50 MG 24 hr tablet Take 50 mg by mouth 2 (two) times daily after a meal. Take with or immediately following a meal.    . nitroGLYCERIN (NITROSTAT) 0.4 MG SL tablet Place 1 tablet (0.4 mg total) under the tongue every 5 (five) minutes x 3 doses as needed for chest pain. 30 tablet 4  . omega-3 acid ethyl esters (LOVAZA) 1 G capsule Take 1 g by mouth daily.    . ramipril (ALTACE) 10 MG capsule Take 10 mg by mouth 2 (two) times daily.    . Ranibizumab (LUCENTIS) 0.5 MG/0.05ML SOLN Inject 0.5 mg into the eye See admin instructions. Pt gets this injection every 9 weeks. Next treatment is due on 01-18-12. Pt is followed by [facility].  Gets 1 shot at a time, one week apart.    . warfarin (COUMADIN) 5 MG tablet TAKE AS DIRECTED BY COUMADIN CLINIC. 40 tablet 3   No current facility-administered medications for this visit.     Allergies:   Patient has no known allergies.   Social History:  The patient  reports that he quit smoking about 33 years ago. His smoking use included Cigarettes. He has never used smokeless tobacco. He reports that he drinks about 4.2 oz of alcohol per week . He reports that he does not use drugs.    Family History:  The patient's  family history includes Heart attack (age of onset: 80) in his mother.   ROS:  Please see the history of present illness.  Otherwise, review of systems is positive for Vision loss secondary to macular degeneration, right leg swelling, back pain, easy bruising.  All other systems are reviewed and negative.   PHYSICAL EXAM: VS:  BP (!) 150/70   Pulse 64   Ht 5' 9" (1.753 m)   Wt 191 lb 1.9 oz (86.7 kg)   BMI 28.22 kg/m  , BMI Body mass index is 28.22 kg/m. GEN: Well nourished, well developed, in no acute distress -   pleasant elderly male HEENT: normal  Neck: no JVD, no masses. Bilateral carotid bruits Cardiac: irregular with grade 3/6 harsh late peaking systolic murmur at the left lower sternal border and right upper sternal border, A2 is audible              Respiratory:  clear to auscultation bilaterally, normal work of breathing GI: soft, nontender, nondistended, + BS MS: no deformity or atrophy  Ext: 2+ right knee and pretibial edema with varicosities Skin: warm and dry, no rash Neuro:  Strength and sensation are intact Psych: euthymic mood, full affect  EKG:  EKG is ordered today. The ekg ordered today shows atrial fibrillation 64 bpm, right bundle branch block.  Recent Labs: No results found for requested labs within last 8760 hours.   Lipid Panel     Component Value Date/Time   CHOL 126 10/03/2013 1009   TRIG 58.0 10/03/2013 1009   HDL 45.50 10/03/2013 1009   CHOLHDL 3 10/03/2013 1009   VLDL 11.6 10/03/2013 1009   LDLCALC 69 10/03/2013 1009      Wt Readings from Last 3 Encounters:  04/14/16 191 lb 1.9 oz (86.7 kg)  10/04/15 189 lb (85.7 kg)  08/19/14 189 lb 1.9 oz (85.8 kg)     Cardiac Studies Reviewed: 2D Echo 09-30-2015: Left ventricle: The cavity size was normal. There was mild concentric hypertrophy. Systolic function was normal. The estimated ejection fraction was in the range of 60% to 65%. The transmitral flow pattern  was normal. The deceleration time of the early transmitral flow velocity was normal. The pulmonary vein flow pattern was normal. The tissue Doppler parameters were normal. Left ventricular diastolic function parameters were normal.  ------------------------------------------------------------------- Aortic valve: Trileaflet; severely thickened, severely calcified leaflets. Doppler: There was moderate to severe stenosis. VTI ratio of LVOT to aortic valve: 0.21. Valve area (VTI): 0.88 cm^2. Indexed valve area (VTI): 0.43 cm^2/m^2. Peak velocity ratio of LVOT to aortic valve: 0.2. Valve area (Vmax): 0.85 cm^2. Indexed valve area (Vmax): 0.41 cm^2/m^2. Mean velocity ratio of LVOT to aortic valve: 0.18. Valve area (Vmean): 0.74 cm^2. Indexed valve area (Vmean): 0.36 cm^2/m^2. Mean gradient (S): 32 mm Hg. Peak gradient (S): 54 mm Hg.  ------------------------------------------------------------------- Aorta: The aorta was normal, not dilated, and non-diseased.  ------------------------------------------------------------------- Mitral valve: Calcified annulus. Mildly thickened leaflets . Doppler: There was mild regurgitation. Peak gradient (D): 5 mm Hg.  ------------------------------------------------------------------- Left atrium: The atrium was moderately dilated.  ------------------------------------------------------------------- Atrial septum: No defect or patent foramen ovale was identified.  ------------------------------------------------------------------- Pulmonic valve: Doppler: There was trivial regurgitation.  ------------------------------------------------------------------- Tricuspid valve: Doppler: There was mild regurgitation.  ------------------------------------------------------------------- Right atrium: The atrium was normal in size.  ------------------------------------------------------------------- Pericardium: The  pericardium was normal in appearance.  ------------------------------------------------------------------- Systemic veins: Inferior vena cava: The vessel was normal in size. The respirophasic diameter changes were in the normal range (>= 50%), consistent with normal central venous pressure. Diameter: 25.5 mm.   ASSESSMENT AND PLAN: 1.  Aortic stenosis, moderate based on mean gradient of 32 mmHg with preserved LV systolic function. The patient did have some shortness of breath when he hurried up a hill recently. Otherwise he reports no symptoms of chest pain or pressure, lightheadedness, or shortness of breath that his normal activity level. I would like to see him back in 6 months with an echocardiogram. I suspect he may need intervention on his aortic valve in the next year. We had a lengthy discussion about typical symptoms associated with aortic stenosis. He will notify   us sooner than his 6 month follow-up if he has progressive symptoms of shortness of breath or exercise intolerance. We discussed the natural history of aortic stenosis at length today. We also reviewed potential treatment options and specifically discussed TAVR as a potential treatment option in the context of his advanced age.   2. Atrial fibrillation/flutter:  He is tolerating warfarin without bleeding problems. Heart rate is well controlled on metoprolol.  3. Conduction disease: Permanent atrial fibrillation with right bundle branch block pattern on EKG. If he were to undergo TAVR, he would be at higher risk of requiring a permanent pacemaker.  4. CAD, native vessel, with angina: No symptoms on a combination of isosorbide, long-acting metoprolol, and amlodipine.  5. Hyperlipidemia: Continue atorvastatin  6. Leg edema: Suspect venous insufficiency. Patient also has had a partial right knee replacement with swelling in the knee. He does not have knee pain. Will prescribe a compression stocking and leg elevation is  advised.   Current medicines are reviewed with the patient today.  The patient does not have concerns regarding medicines.  Labs/ tests ordered today include:   Orders Placed This Encounter  Procedures  . EKG 12-Lead  . ECHOCARDIOGRAM COMPLETE    Disposition:   FU 6 months with an echo prior to the visit  Signed, Zackaria Burkey, MD  04/15/2016 6:59 PM    [facility] [address], [place], [place]  [address] Phone: [phone]; Fax: [phone]  

## 2016-04-18 ENCOUNTER — Encounter: Payer: Self-pay | Admitting: Podiatry

## 2016-04-18 ENCOUNTER — Ambulatory Visit (INDEPENDENT_AMBULATORY_CARE_PROVIDER_SITE_OTHER): Payer: Medicare Other | Admitting: Podiatry

## 2016-04-18 DIAGNOSIS — B351 Tinea unguium: Secondary | ICD-10-CM

## 2016-04-18 DIAGNOSIS — M79676 Pain in unspecified toe(s): Secondary | ICD-10-CM

## 2016-04-18 NOTE — Progress Notes (Signed)
He presents today to complaint of painfully bilateral ingrown toenails.  Objective: Vital signs are stable he is alert and oriented 3. His nails are long thick yellow dystrophic and mycotic sharply incurvated.  Assessment: Pain and limiting her onychomycosis.  Plan: Debridement of toenails 1 through 5 bilateral follow-up with me as needed.

## 2016-04-28 ENCOUNTER — Telehealth: Payer: Self-pay | Admitting: Cardiovascular Disease

## 2016-04-28 DIAGNOSIS — I350 Nonrheumatic aortic (valve) stenosis: Secondary | ICD-10-CM

## 2016-04-28 NOTE — Telephone Encounter (Signed)
New Message   Pt c/o Shortness Of Breath: STAT if SOB developed within the last 24 hours or pt is noticeably SOB on the phone  1. Are you currently SOB (can you hear that pt is SOB on the phone)? Yes  2. How long have you been experiencing SOB? Couple weeks  3. Are you SOB when sitting or when up moving around? Moving around  4. Are you currently experiencing any other symptoms? Fatigue  Requesting call back from nurse

## 2016-04-28 NOTE — Telephone Encounter (Signed)
I spoke with the pt and he has noticed over the past few weeks that he can only walk a half mile when he exercises before struggling to catch his breath.  A few months ago the pt could walk 1 1/2-2 miles before developing SOB.    Discussed with Dr Burt Knack and the pt needs a right and left heart cath and pt should have repeat 2D echo.  I advised the pt that I will touch base with him next week with arrangements.

## 2016-05-02 NOTE — Telephone Encounter (Signed)
Spoke with pt and made him aware procedure had not been set up as of yet.  Advised I would send message to Dr. Antionette Char nurse.  Pt appreciative for assistance.

## 2016-05-02 NOTE — Telephone Encounter (Signed)
Mr.Lambrecht is calling to find out if the procedure has been schedule as of yet ?

## 2016-05-04 ENCOUNTER — Telehealth: Payer: Self-pay | Admitting: *Deleted

## 2016-05-04 NOTE — Telephone Encounter (Signed)
Spoke with Tyler Williams  as he has instructions to hold coumadin for rt and left heart cath  on March 30th that he needs to ask MD doing procedure when to restart coumadin and when instructed to restart   Restart at same dose and appt made for him to be rechecked in coumadin clinic on April 6th and he states understanding

## 2016-05-04 NOTE — Telephone Encounter (Signed)
-----   Message from Thompson Grayer, RN sent at 05/04/2016  1:55 PM EDT ----- Regarding: appt Pt was scheduled for coumadin clinic on 3/30 but he is now having a cath that day so I cancelled the coumadin clinic appt

## 2016-05-04 NOTE — Telephone Encounter (Signed)
I spoke with pt and told him cath has been arranged for 05/12/16.  I verbally went over all cath instructions with him.  He does not use my chart so I will mail copy to him and also leave copy at front desk for him to pick up.  I scheduled echo for 05/18/16 at 3:00.  Will include appt date and time with cath instructions.

## 2016-05-08 ENCOUNTER — Telehealth: Payer: Self-pay | Admitting: Cardiovascular Disease

## 2016-05-08 NOTE — Telephone Encounter (Signed)
Returned call to patient-states he is scheduled for a eye injection on Thursday, where they inject Lucentis into his eye for macular degeneration.  Wanted to verify this wouldn't interfere with his cath on Friday 3/30.  Advised I do not think this would be an issue but will verify with MD.    Patient aware and verbalized understanding.    Routed to Dr. Burt Knack to verify.

## 2016-05-08 NOTE — Telephone Encounter (Signed)
Returned call and spoke to wife (ok per DPR), aware ok to have injection.  Verbalized understanding

## 2016-05-08 NOTE — Telephone Encounter (Signed)
This is fine thanks 

## 2016-05-08 NOTE — Telephone Encounter (Signed)
Patient calling, is scheduled to have a cath lab completed on 05/12/16. Patient also states that he is scheduled to have an eye injection on 05-11-16 and would like to verify if that would be okay to have completed. Please call to discuss, thanks.

## 2016-05-11 DIAGNOSIS — H353231 Exudative age-related macular degeneration, bilateral, with active choroidal neovascularization: Secondary | ICD-10-CM | POA: Diagnosis not present

## 2016-05-12 ENCOUNTER — Ambulatory Visit (HOSPITAL_COMMUNITY)
Admission: RE | Admit: 2016-05-12 | Discharge: 2016-05-12 | Disposition: A | Discharge status: Home / Self Care | Payer: Medicare Other | Source: Ambulatory Visit | Attending: Cardiovascular Disease | Admitting: Cardiovascular Disease

## 2016-05-12 ENCOUNTER — Encounter (HOSPITAL_COMMUNITY)
Admission: RE | Disposition: A | Discharge status: Home / Self Care | Payer: Self-pay | Source: Ambulatory Visit | Attending: Cardiovascular Disease

## 2016-05-12 DIAGNOSIS — I739 Peripheral vascular disease, unspecified: Secondary | ICD-10-CM | POA: Insufficient documentation

## 2016-05-12 DIAGNOSIS — I2584 Coronary atherosclerosis due to calcified coronary lesion: Secondary | ICD-10-CM | POA: Diagnosis not present

## 2016-05-12 DIAGNOSIS — Z8249 Family history of ischemic heart disease and other diseases of the circulatory system: Secondary | ICD-10-CM | POA: Diagnosis not present

## 2016-05-12 DIAGNOSIS — I482 Chronic atrial fibrillation: Secondary | ICD-10-CM | POA: Diagnosis not present

## 2016-05-12 DIAGNOSIS — I4510 Unspecified right bundle-branch block: Secondary | ICD-10-CM | POA: Diagnosis not present

## 2016-05-12 DIAGNOSIS — Z87891 Personal history of nicotine dependence: Secondary | ICD-10-CM | POA: Insufficient documentation

## 2016-05-12 DIAGNOSIS — E7800 Pure hypercholesterolemia, unspecified: Secondary | ICD-10-CM | POA: Insufficient documentation

## 2016-05-12 DIAGNOSIS — I4892 Unspecified atrial flutter: Secondary | ICD-10-CM | POA: Insufficient documentation

## 2016-05-12 DIAGNOSIS — R600 Localized edema: Secondary | ICD-10-CM | POA: Diagnosis not present

## 2016-05-12 DIAGNOSIS — I10 Essential (primary) hypertension: Secondary | ICD-10-CM | POA: Insufficient documentation

## 2016-05-12 DIAGNOSIS — Z86711 Personal history of pulmonary embolism: Secondary | ICD-10-CM | POA: Insufficient documentation

## 2016-05-12 DIAGNOSIS — Z96651 Presence of right artificial knee joint: Secondary | ICD-10-CM | POA: Diagnosis not present

## 2016-05-12 DIAGNOSIS — E785 Hyperlipidemia, unspecified: Secondary | ICD-10-CM | POA: Insufficient documentation

## 2016-05-12 DIAGNOSIS — I701 Atherosclerosis of renal artery: Secondary | ICD-10-CM | POA: Insufficient documentation

## 2016-05-12 DIAGNOSIS — I350 Nonrheumatic aortic (valve) stenosis: Secondary | ICD-10-CM

## 2016-05-12 DIAGNOSIS — I25110 Atherosclerotic heart disease of native coronary artery with unstable angina pectoris: Secondary | ICD-10-CM | POA: Insufficient documentation

## 2016-05-12 DIAGNOSIS — Z7901 Long term (current) use of anticoagulants: Secondary | ICD-10-CM | POA: Insufficient documentation

## 2016-05-12 DIAGNOSIS — M170 Bilateral primary osteoarthritis of knee: Secondary | ICD-10-CM | POA: Diagnosis not present

## 2016-05-12 DIAGNOSIS — K219 Gastro-esophageal reflux disease without esophagitis: Secondary | ICD-10-CM | POA: Insufficient documentation

## 2016-05-12 DIAGNOSIS — I495 Sick sinus syndrome: Secondary | ICD-10-CM | POA: Diagnosis not present

## 2016-05-12 HISTORY — PX: RIGHT/LEFT HEART CATH AND CORONARY ANGIOGRAPHY: CATH118266

## 2016-05-12 LAB — BASIC METABOLIC PANEL
Anion gap: 9 (ref 5–15)
BUN: 12 mg/dL (ref 6–20)
CO2: 26 mmol/L (ref 22–32)
Calcium: 9 mg/dL (ref 8.9–10.3)
Chloride: 104 mmol/L (ref 101–111)
Creatinine, Ser: 0.8 mg/dL (ref 0.61–1.24)
GFR calc Af Amer: >60 mL/min (ref >60)
GFR calc non Af Amer: >60 mL/min (ref >60)
Glucose, Bld: 97 mg/dL (ref 65–99)
Potassium: 4 mmol/L (ref 3.5–5.1)
Sodium: 139 mmol/L (ref 135–145)

## 2016-05-12 LAB — CBC
HCT: 42.8 % (ref 39.0–52.0)
Hemoglobin: 14.7 g/dL (ref 13.0–17.0)
MCH: 33 pg (ref 26.0–34.0)
MCHC: 34.3 g/dL (ref 30.0–36.0)
MCV: 96.2 fL (ref 78.0–100.0)
Platelets: 115 10*3/uL — ABNORMAL LOW (ref 150–400)
RBC: 4.45 MIL/uL (ref 4.22–5.81)
RDW: 14.1 % (ref 11.5–15.5)
WBC: 6 10*3/uL (ref 4.0–10.5)

## 2016-05-12 LAB — POCT I-STAT 3, VENOUS BLOOD GAS (G3P V)
Acid-base deficit: 1 mmol/L (ref 0.0–2.0)
Bicarbonate: 24.6 mmol/L (ref 20.0–28.0)
O2 Saturation: 71 %
TCO2: 26 mmol/L (ref 0–100)
pCO2, Ven: 41 mmHg — ABNORMAL LOW (ref 44.0–60.0)
pH, Ven: 7.386 (ref 7.250–7.430)
pO2, Ven: 38 mmHg (ref 32.0–45.0)

## 2016-05-12 LAB — POCT I-STAT 3, ART BLOOD GAS (G3+)
Acid-base deficit: 2 mmol/L (ref 0.0–2.0)
Bicarbonate: 23.6 mmol/L (ref 20.0–28.0)
O2 Saturation: 94 %
TCO2: 25 mmol/L (ref 0–100)
pCO2 arterial: 40.5 mmHg (ref 32.0–48.0)
pH, Arterial: 7.374 (ref 7.350–7.450)
pO2, Arterial: 72 mmHg — ABNORMAL LOW (ref 83.0–108.0)

## 2016-05-12 LAB — POCT ACTIVATED CLOTTING TIME: Activated Clotting Time: 197 seconds

## 2016-05-12 LAB — PROTIME-INR
INR: 1.25
Prothrombin Time: 15.8 seconds — ABNORMAL HIGH (ref 11.4–15.2)

## 2016-05-12 SURGERY — RIGHT/LEFT HEART CATH AND CORONARY ANGIOGRAPHY
Anesthesia: LOCAL

## 2016-05-12 MED ORDER — LIDOCAINE HCL (PF) 1 % IJ SOLN
INTRAMUSCULAR | Status: DC | PRN
  Administered 2016-05-12: 12 mL
  Administered 2016-05-12: 2 mL via SUBCUTANEOUS
  Administered 2016-05-12: 2 mL

## 2016-05-12 MED ORDER — SODIUM CHLORIDE 0.9 % IV SOLN: 250.0000 mL | INTRAVENOUS | Status: DC | PRN

## 2016-05-12 MED ORDER — HEPARIN (PORCINE) IN NACL 2-0.9 UNIT/ML-% IJ SOLN
INTRAMUSCULAR | Status: DC | PRN
  Administered 2016-05-12: 1000 mL

## 2016-05-12 MED ORDER — ASPIRIN 81 MG PO CHEW
CHEWABLE_TABLET | ORAL | Status: AC
  Administered 2016-05-12: 81 mg via ORAL
  Filled 2016-05-12: qty 1

## 2016-05-12 MED ORDER — SODIUM CHLORIDE 0.9 % WEIGHT BASED INFUSION: 1.0000 mL/kg/h | INTRAVENOUS | Status: DC

## 2016-05-12 MED ORDER — SODIUM CHLORIDE 0.9% FLUSH: 3.0000 mL | INTRAVENOUS | Status: DC | PRN

## 2016-05-12 MED ORDER — SODIUM CHLORIDE 0.9% FLUSH: 3.0000 mL | Freq: Two times a day (BID) | INTRAVENOUS | Status: DC

## 2016-05-12 MED ORDER — FENTANYL CITRATE (PF) 100 MCG/2ML IJ SOLN
INTRAMUSCULAR | Status: AC
  Filled 2016-05-12: qty 2

## 2016-05-12 MED ORDER — MIDAZOLAM HCL 2 MG/2ML IJ SOLN
INTRAMUSCULAR | Status: DC | PRN
  Administered 2016-05-12: 2 mg via INTRAVENOUS

## 2016-05-12 MED ORDER — HEPARIN SODIUM (PORCINE) 1000 UNIT/ML IJ SOLN
INTRAMUSCULAR | Status: DC | PRN
  Administered 2016-05-12: 4500 [IU] via INTRAVENOUS

## 2016-05-12 MED ORDER — VERAPAMIL HCL 2.5 MG/ML IV SOLN
INTRAVENOUS | Status: AC
  Filled 2016-05-12: qty 2

## 2016-05-12 MED ORDER — FENTANYL CITRATE (PF) 100 MCG/2ML IJ SOLN
INTRAMUSCULAR | Status: DC | PRN
  Administered 2016-05-12: 25 ug via INTRAVENOUS

## 2016-05-12 MED ORDER — ONDANSETRON HCL 4 MG/2ML IJ SOLN: 4.0000 mg | Freq: Four times a day (QID) | INTRAMUSCULAR | Status: DC | PRN

## 2016-05-12 MED ORDER — SODIUM CHLORIDE 0.9 % WEIGHT BASED INFUSION
3.0000 mL/kg/h | INTRAVENOUS | Status: AC
  Administered 2016-05-12: 3 mL/kg/h via INTRAVENOUS

## 2016-05-12 MED ORDER — VERAPAMIL HCL 2.5 MG/ML IV SOLN
INTRAVENOUS | Status: DC | PRN
  Administered 2016-05-12: 10 mL via INTRA_ARTERIAL

## 2016-05-12 MED ORDER — IOPAMIDOL (ISOVUE-370) INJECTION 76%
INTRAVENOUS | Status: AC
  Filled 2016-05-12: qty 100

## 2016-05-12 MED ORDER — ASPIRIN 81 MG PO CHEW
81.0000 mg | CHEWABLE_TABLET | ORAL | Status: AC
  Administered 2016-05-12: 81 mg via ORAL

## 2016-05-12 MED ORDER — MIDAZOLAM HCL 2 MG/2ML IJ SOLN
INTRAMUSCULAR | Status: AC
  Filled 2016-05-12: qty 2

## 2016-05-12 MED ORDER — HEPARIN SODIUM (PORCINE) 1000 UNIT/ML IJ SOLN
INTRAMUSCULAR | Status: AC
  Filled 2016-05-12: qty 1

## 2016-05-12 MED ORDER — LIDOCAINE HCL (PF) 1 % IJ SOLN
INTRAMUSCULAR | Status: AC
  Filled 2016-05-12: qty 30

## 2016-05-12 MED ORDER — HEPARIN (PORCINE) IN NACL 2-0.9 UNIT/ML-% IJ SOLN
INTRAMUSCULAR | Status: AC
  Filled 2016-05-12: qty 1000

## 2016-05-12 MED ORDER — IOPAMIDOL (ISOVUE-370) INJECTION 76%
INTRAVENOUS | Status: DC | PRN
  Administered 2016-05-12: 70 mL via INTRA_ARTERIAL

## 2016-05-12 MED ORDER — ACETAMINOPHEN 325 MG PO TABS: 650.0000 mg | ORAL_TABLET | ORAL | Status: DC | PRN

## 2016-05-12 SURGICAL SUPPLY — 19 items
CATH 5FR JL3.5 JR4 ANG PIG MP (CATHETERS) ×1 IMPLANT
CATH BALLN WEDGE 5F 110CM (CATHETERS) ×1 IMPLANT
CATH INFINITI 5FR AL1 (CATHETERS) ×1 IMPLANT
CATH SWAN GANZ 7F STRAIGHT (CATHETERS) ×1 IMPLANT
DEVICE RAD COMP TR BAND LRG (VASCULAR PRODUCTS) ×1 IMPLANT
GLIDESHEATH SLEND SS 6F .021 (SHEATH) ×1 IMPLANT
GUIDEWIRE .025 260CM (WIRE) ×1 IMPLANT
GUIDEWIRE INQWIRE 1.5J.035X260 (WIRE) IMPLANT
INQWIRE 1.5J .035X260CM (WIRE) ×2
KIT ESSENTIALS PG (KITS) ×1 IMPLANT
KIT HEART LEFT (KITS) ×2 IMPLANT
PACK CARDIAC CATHETERIZATION (CUSTOM PROCEDURE TRAY) ×2 IMPLANT
SHEATH FAST CATH BRACH 5F 5CM (SHEATH) ×1 IMPLANT
SHEATH PINNACLE 7F 10CM (SHEATH) ×1 IMPLANT
SYR MEDRAD MARK V 150ML (SYRINGE) ×2 IMPLANT
TRANSDUCER W/STOPCOCK (MISCELLANEOUS) ×2 IMPLANT
TUBING CIL FLEX 10 FLL-RA (TUBING) ×2 IMPLANT
WIRE COUGAR XT STRL 190CM (WIRE) ×1 IMPLANT
WIRE EMERALD ST .035X260CM (WIRE) ×1 IMPLANT

## 2016-05-12 NOTE — Discharge Instructions (Signed)
Radial Site Care °Refer to this sheet in the next few weeks. These instructions provide you with information about caring for yourself after your procedure. Your health care provider may also give you more specific instructions. Your treatment has been planned according to current medical practices, but problems sometimes occur. Call your health care provider if you have any problems or questions after your procedure. °What can I expect after the procedure? °After your procedure, it is typical to have the following: °· Bruising at the radial site that usually fades within 1-2 weeks. °· Blood collecting in the tissue (hematoma) that may be painful to the touch. It should usually decrease in size and tenderness within 1-2 weeks. °Follow these instructions at home: °· Take medicines only as directed by your health care provider. °· You may shower 24-48 hours after the procedure or as directed by your health care provider. Remove the bandage (dressing) and gently wash the site with plain soap and water. Pat the area dry with a clean towel. Do not rub the site, because this may cause bleeding. °· Do not take baths, swim, or use a hot tub until your health care provider approves. °· Check your insertion site every day for redness, swelling, or drainage. °· Do not apply powder or lotion to the site. °· Do not flex or bend the affected arm for 24 hours or as directed by your health care provider. °· Do not push or pull heavy objects with the affected arm for 24 hours or as directed by your health care provider. °· Do not lift over 10 lb (4.5 kg) for 5 days after your procedure or as directed by your health care provider. °· Ask your health care provider when it is okay to: °¨ Return to work or school. °¨ Resume usual physical activities or sports. °¨ Resume sexual activity. °· Do not drive home if you are discharged the same day as the procedure. Have someone else drive you. °· You may drive 24 hours after the procedure  unless otherwise instructed by your health care provider. °· Do not operate machinery or power tools for 24 hours after the procedure. °· If your procedure was done as an outpatient procedure, which means that you went home the same day as your procedure, a responsible adult should be with you for the first 24 hours after you arrive home. °· Keep all follow-up visits as directed by your health care provider. This is important. °Contact a health care provider if: °· You have a fever. °· You have chills. °· You have increased bleeding from the radial site. Hold pressure on the site. CALL 911 °Get help right away if: °· You have unusual pain at the radial site. °· You have redness, warmth, or swelling at the radial site. °· You have drainage (other than a small amount of blood on the dressing) from the radial site. °· The radial site is bleeding, and the bleeding does not stop after 30 minutes of holding steady pressure on the site. °· Your arm or hand becomes pale, cool, tingly, or numb. °This information is not intended to replace advice given to you by your health care provider. Make sure you discuss any questions you have with your health care provider. °Document Released: 03/04/2010 Document Revised: 07/08/2015 Document Reviewed: 08/18/2013 °Elsevier Interactive Patient Education © 2017 [employer] ° ° °

## 2016-05-12 NOTE — H&P (View-Only) (Signed)
Cardiology Office Note Date:  04/15/2016   ID:  Tyler Williams, DOB February 21, 1926, MRN [id number]  PCP:  Geoffery Lyons, MD  Cardiologist:  Sherren Mocha, MD    Chief Complaint  Patient presents with  . Shortness of Breath     History of Present Illness: Tyler Williams is a 83 y.o. male who presents for follow-up of chronic atrial fibrillation on coumadin, CAD s/p PTCA in 1980s and 1990s, PAD, AAA repair, and AS. He was admitted 05/2012 with [place]. LHC demonstrated mid RCA 99%. PCI was attempted but unsuccessful and Med Rx was recommended. He's been more recently followed for aortic stenosis with yearly echo studies, but has remained asymptomatic.   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 He scored a touchdown against [employer].  The patient is here alone today. He has been doing fairly well. His biggest complaint relates to right knee and lower leg swelling. He had a partial knee replacement several years ago. His leg swelling improves with elevation. He has not been able to tolerate thigh-high compression stockings. He denies orthopnea, chest pain, PND, or lightheadedness. He recently had to hurry up a hill when he became short of breath with that. Symptoms resolved with rest. He denies shortness of breath with his normal activities. No heart palpitations.  Past Medical History:  Diagnosis Date  . AAA (abdominal aortic aneurysm) ([place])    s/p repair  . Aortic stenosis, mild    a. mean gradient 17 mmHg on 04/2012 TTE  . Arthritis    knees  . CAD (coronary artery disease)    a. s/p multiple percutaneous prior coronary artery interventions b. 05/2012: unsuccessful PCI to tight RCA->medically managed  . Carotid artery disease (HCC)    0-39% bilateral ICA stenoses 11/2011  . Chronic anticoagulation    Coumadin  . GERD (gastroesophageal reflux disease)   . GERD (gastroesophageal reflux disease)   . History of  pulmonary embolism    1964 related to dislocated hip on right   . History of skin cancer   . Hypercholesteremia   . Hypertension   . Hypertension   . Microscopic hematuria    Followed by urology  . Permanent atrial fibrillation ([place])   . PVD (peripheral vascular disease) ([place])    Bilateral renal artery atherosclerosis, SMA stenosis with collateralization  . RBBB    Chronic  . SSS (sick sinus syndrome) [facility])     Past Surgical History:  Procedure Laterality Date  . ABDOMINAL AORTIC ANEURYSM REPAIR     1993   . CARDIAC CATHETERIZATION  05/24/2012   pD1 20%, oCFX 20%, mCFX 30%, ostial Int Br 30%, distal AV groove CFX 30%, pRCA 30-40%, mRCA 99%, dRCA 30%, PL branch small with diffuse 40%. Unable to pass wire past RCA lesion->medically managed  . CHOLECYSTECTOMY N/A 12/05/2012   Procedure: LAPAROSCOPIC CHOLECYSTECTOMY ;  Surgeon: Edward Jolly, MD;  Location: [place];  Service: General;  Laterality: N/A;  . [place]  . EYE SURGERY     hx of cataract surgery   . [place]  . HERNIA REPAIR     right inguinal hernia repair   . JOINT REPLACEMENT     left knee replacement, partial right  . LEFT HEART CATHETERIZATION WITH CORONARY ANGIOGRAM N/A 05/24/2012   Procedure: LEFT HEART CATHETERIZATION WITH CORONARY ANGIOGRAM;  Surgeon: Burnell Blanks,  MD;  Location: [place] CATH LAB;  Service: Cardiovascular;  Laterality: N/A;  . ORTHOPEDIC SURGERY    . OTHER SURGICAL HISTORY     right knee popliteal aneurysm surgery stent placed   . OTHER SURGICAL HISTORY    . PARTIAL KNEE ARTHROPLASTY  01/22/2012   Procedure: UNICOMPARTMENTAL KNEE;  Surgeon: Mauri Pole, MD;  [facility];  Service: Orthopedics;  Laterality: Right;  . PERCUTANEOUS CORONARY INTERVENTION-BALLOON ONLY  05/24/2012   Procedure: PERCUTANEOUS CORONARY INTERVENTION-BALLOON ONLY;  Surgeon: Burnell Blanks, MD;  Location: [facility] CATH LAB;  Service: Cardiovascular;;  .  TONSILLECTOMY      Current Outpatient Prescriptions  Medication Sig Dispense Refill  . amLODipine (NORVASC) 5 MG tablet Take 5 mg by mouth daily.    Marland Kitchen atorvastatin (LIPITOR) 20 MG tablet TAKE 1 TABLET ONCE DAILY. 30 tablet 11  . Cholecalciferol (VITAMIN D-3) 5000 UNITS TABS Take 5,000 Units by mouth daily.     . Coenzyme Q10 (CO Q 10) 100 MG CAPS Take 1 capsule by mouth every morning.     . hydrochlorothiazide (MICROZIDE) 12.5 MG capsule TAKE (1) CAPSULE DAILY. 30 capsule 11  . ibuprofen (ADVIL,MOTRIN) 200 MG tablet Take 400 mg by mouth every 6 (six) hours as needed for pain.    . isosorbide mononitrate (IMDUR) 60 MG 24 hr tablet TAKE 1 TABLET EACH DAY. 30 tablet 11  . LORazepam (ATIVAN) 1 MG tablet Take 1 mg by mouth at bedtime.     . metoprolol succinate (TOPROL-XL) 50 MG 24 hr tablet Take 50 mg by mouth 2 (two) times daily after a meal. Take with or immediately following a meal.    . nitroGLYCERIN (NITROSTAT) 0.4 MG SL tablet Place 1 tablet (0.4 mg total) under the tongue every 5 (five) minutes x 3 doses as needed for chest pain. 30 tablet 4  . omega-3 acid ethyl esters (LOVAZA) 1 G capsule Take 1 g by mouth daily.    . ramipril (ALTACE) 10 MG capsule Take 10 mg by mouth 2 (two) times daily.    . Ranibizumab (LUCENTIS) 0.5 MG/0.05ML SOLN Inject 0.5 mg into the eye See admin instructions. Pt gets this injection every 9 weeks. Next treatment is due on 01-18-12. Pt is followed by [facility].  Gets 1 shot at a time, one week apart.    . warfarin (COUMADIN) 5 MG tablet TAKE AS DIRECTED BY COUMADIN CLINIC. 40 tablet 3   No current facility-administered medications for this visit.     Allergies:   Patient has no known allergies.   Social History:  The patient  reports that he quit smoking about 33 years ago. His smoking use included Cigarettes. He has never used smokeless tobacco. He reports that he drinks about 4.2 oz of alcohol per week . He reports that he does not use drugs.    Family History:  The patient's  family history includes Heart attack (age of onset: 78) in his mother.   ROS:  Please see the history of present illness.  Otherwise, review of systems is positive for Vision loss secondary to macular degeneration, right leg swelling, back pain, easy bruising.  All other systems are reviewed and negative.   PHYSICAL EXAM: VS:  BP (!) 150/70   Pulse 64   Ht 5\' 9"  (1.753 m)   Wt 191 lb 1.9 oz (86.7 kg)   BMI 28.22 kg/m  , BMI Body mass index is 28.22 kg/m. GEN: Well nourished, well developed, in no acute distress -  pleasant elderly male HEENT: normal  Neck: no JVD, no masses. Bilateral carotid bruits Cardiac: irregular with grade 3/6 harsh late peaking systolic murmur at the left lower sternal border and right upper sternal border, A2 is audible              Respiratory:  clear to auscultation bilaterally, normal work of breathing GI: soft, nontender, nondistended, + BS MS: no deformity or atrophy  Ext: 2+ right knee and pretibial edema with varicosities Skin: warm and dry, no rash Neuro:  Strength and sensation are intact Psych: euthymic mood, full affect  EKG:  EKG is ordered today. The ekg ordered today shows atrial fibrillation 64 bpm, right bundle branch block.  Recent Labs: No results found for requested labs within last 8760 hours.   Lipid Panel     Component Value Date/Time   CHOL 126 10/03/2013 1009   TRIG 58.0 10/03/2013 1009   HDL 45.50 10/03/2013 1009   CHOLHDL 3 10/03/2013 1009   VLDL 11.6 10/03/2013 1009   LDLCALC 69 10/03/2013 1009      Wt Readings from Last 3 Encounters:  04/14/16 191 lb 1.9 oz (86.7 kg)  10/04/15 189 lb (85.7 kg)  08/19/14 189 lb 1.9 oz (85.8 kg)     Cardiac Studies Reviewed: 2D Echo 09-30-2015: Left ventricle: The cavity size was normal. There was mild concentric hypertrophy. Systolic function was normal. The estimated ejection fraction was in the range of 60% to 65%. The transmitral flow pattern  was normal. The deceleration time of the early transmitral flow velocity was normal. The pulmonary vein flow pattern was normal. The tissue Doppler parameters were normal. Left ventricular diastolic function parameters were normal.  ------------------------------------------------------------------- Aortic valve: Trileaflet; severely thickened, severely calcified leaflets. Doppler: There was moderate to severe stenosis. VTI ratio of LVOT to aortic valve: 0.21. Valve area (VTI): 0.88 cm^2. Indexed valve area (VTI): 0.43 cm^2/m^2. Peak velocity ratio of LVOT to aortic valve: 0.2. Valve area (Vmax): 0.85 cm^2. Indexed valve area (Vmax): 0.41 cm^2/m^2. Mean velocity ratio of LVOT to aortic valve: 0.18. Valve area (Vmean): 0.74 cm^2. Indexed valve area (Vmean): 0.36 cm^2/m^2. Mean gradient (S): 32 mm Hg. Peak gradient (S): 54 mm Hg.  ------------------------------------------------------------------- Aorta: The aorta was normal, not dilated, and non-diseased.  ------------------------------------------------------------------- Mitral valve: Calcified annulus. Mildly thickened leaflets . Doppler: There was mild regurgitation. Peak gradient (D): 5 mm Hg.  ------------------------------------------------------------------- Left atrium: The atrium was moderately dilated.  ------------------------------------------------------------------- Atrial septum: No defect or patent foramen ovale was identified.  ------------------------------------------------------------------- Pulmonic valve: Doppler: There was trivial regurgitation.  ------------------------------------------------------------------- Tricuspid valve: Doppler: There was mild regurgitation.  ------------------------------------------------------------------- Right atrium: The atrium was normal in size.  ------------------------------------------------------------------- Pericardium: The  pericardium was normal in appearance.  ------------------------------------------------------------------- Systemic veins: Inferior vena cava: The vessel was normal in size. The respirophasic diameter changes were in the normal range (>= 50%), consistent with normal central venous pressure. Diameter: 25.5 mm.   ASSESSMENT AND PLAN: 1.  Aortic stenosis, moderate based on mean gradient of 32 mmHg with preserved LV systolic function. The patient did have some shortness of breath when he hurried up a hill recently. Otherwise he reports no symptoms of chest pain or pressure, lightheadedness, or shortness of breath that his normal activity level. I would like to see him back in 6 months with an echocardiogram. I suspect he may need intervention on his aortic valve in the next year. We had a lengthy discussion about typical symptoms associated with aortic stenosis. He will notify  us sooner than his 6 month follow-up if he has progressive symptoms of shortness of breath or exercise intolerance. We discussed the natural history of aortic stenosis at length today. We also reviewed potential treatment options and specifically discussed TAVR as a potential treatment option in the context of his advanced age.   2. Atrial fibrillation/flutter:  He is tolerating warfarin without bleeding problems. Heart rate is well controlled on metoprolol.  3. Conduction disease: Permanent atrial fibrillation with right bundle branch block pattern on EKG. If he were to undergo TAVR, he would be at higher risk of requiring a permanent pacemaker.  4. CAD, native vessel, with angina: No symptoms on a combination of isosorbide, long-acting metoprolol, and amlodipine.  5. Hyperlipidemia: Continue atorvastatin  6. Leg edema: Suspect venous insufficiency. Patient also has had a partial right knee replacement with swelling in the knee. He does not have knee pain. Will prescribe a compression stocking and leg elevation is  advised.   Current medicines are reviewed with the patient today.  The patient does not have concerns regarding medicines.  Labs/ tests ordered today include:   Orders Placed This Encounter  Procedures  . EKG 12-Lead  . ECHOCARDIOGRAM COMPLETE    Disposition:   FU 6 months with an echo prior to the visit  Signed, Sherren Mocha, MD  04/15/2016 [address], [place], [place]  [address] Phone: [phone]; Fax: [phone]

## 2016-05-12 NOTE — Progress Notes (Addendum)
Site area: Left groin a 7 french venous sheath was removed  Site Prior to Removal:  Level 0  Pressure Applied For 15 MINUTES    Bedrest  Beginning at 1415p  Manual:   Yes.    Patient Status During Pull:  stable  Post Pull Groin Site:  Level 0  Post Pull Instructions Given:  Yes.    Post Pull Pulses Present:  Yes.    Dressing Applied:  Yes.    Comments:  VS remain stable during sheath pull

## 2016-05-12 NOTE — Interval H&P Note (Signed)
History and Physical Interval Note:  05/12/2016 11:44 AM  Tyler Williams  has presented today for surgery, with the diagnosis of unstable angina, as  The various methods of treatment have been discussed with the patient and family. After consideration of risks, benefits and other options for treatment, the patient has consented to  Procedure(s): Right/Left Heart Cath and Coronary Angiography (N/A) as a surgical intervention .  The patient's history has been reviewed, patient examined, no change in status, stable for surgery.  I have reviewed the patient's chart and labs.  Questions were answered to the patient's satisfaction.     Sherren Mocha

## 2016-05-15 ENCOUNTER — Encounter (HOSPITAL_COMMUNITY): Payer: Self-pay | Admitting: Cardiovascular Disease

## 2016-05-15 LAB — POCT ACTIVATED CLOTTING TIME: Activated Clotting Time: 169 seconds

## 2016-05-16 ENCOUNTER — Other Ambulatory Visit: Payer: Self-pay | Admitting: *Deleted

## 2016-05-16 DIAGNOSIS — I350 Nonrheumatic aortic (valve) stenosis: Secondary | ICD-10-CM

## 2016-05-18 ENCOUNTER — Other Ambulatory Visit (HOSPITAL_COMMUNITY): Payer: Medicare Other

## 2016-05-18 ENCOUNTER — Ambulatory Visit (INDEPENDENT_AMBULATORY_CARE_PROVIDER_SITE_OTHER): Payer: Medicare Other | Admitting: *Deleted

## 2016-05-18 ENCOUNTER — Ambulatory Visit (HOSPITAL_BASED_OUTPATIENT_CLINIC_OR_DEPARTMENT_OTHER): Payer: Medicare Other

## 2016-05-18 ENCOUNTER — Other Ambulatory Visit: Payer: Self-pay

## 2016-05-18 ENCOUNTER — Ambulatory Visit (HOSPITAL_COMMUNITY)
Admission: RE | Admit: 2016-05-18 | Discharge: 2016-05-18 | Disposition: A | Discharge status: Home / Self Care | Payer: Medicare Other | Source: Ambulatory Visit | Attending: Cardiovascular Disease | Admitting: Cardiovascular Disease

## 2016-05-18 DIAGNOSIS — I340 Nonrheumatic mitral (valve) insufficiency: Secondary | ICD-10-CM | POA: Insufficient documentation

## 2016-05-18 DIAGNOSIS — Z5181 Encounter for therapeutic drug level monitoring: Secondary | ICD-10-CM | POA: Diagnosis not present

## 2016-05-18 DIAGNOSIS — I350 Nonrheumatic aortic (valve) stenosis: Secondary | ICD-10-CM | POA: Insufficient documentation

## 2016-05-18 DIAGNOSIS — J849 Interstitial pulmonary disease, unspecified: Secondary | ICD-10-CM | POA: Insufficient documentation

## 2016-05-18 DIAGNOSIS — I420 Dilated cardiomyopathy: Secondary | ICD-10-CM | POA: Diagnosis not present

## 2016-05-18 DIAGNOSIS — I4891 Unspecified atrial fibrillation: Secondary | ICD-10-CM | POA: Diagnosis not present

## 2016-05-18 DIAGNOSIS — I6523 Occlusion and stenosis of bilateral carotid arteries: Secondary | ICD-10-CM | POA: Diagnosis not present

## 2016-05-18 DIAGNOSIS — J449 Chronic obstructive pulmonary disease, unspecified: Secondary | ICD-10-CM | POA: Diagnosis not present

## 2016-05-18 LAB — PULMONARY FUNCTION TEST
DL/VA % pred: 87 %
DL/VA: 3.95 ml/min/mmHg/L
DLCO unc % pred: 57 %
DLCO unc: 17.92 ml/min/mmHg
FEF 25-75 Post: 1.11 L/sec
FEF 25-75 Pre: 1.08 L/sec
FEF2575-%Change-Post: 3 %
FEF2575-%Pred-Post: 75 %
FEF2575-%Pred-Pre: 73 %
FEV1-%Change-Post: 2 %
FEV1-%Pred-Post: 79 %
FEV1-%Pred-Pre: 77 %
FEV1-Post: 1.91 L
FEV1-Pre: 1.85 L
FEV1FVC-%Change-Post: 7 %
FEV1FVC-%Pred-Pre: 96 %
FEV6-%Change-Post: -3 %
FEV6-%Pred-Post: 80 %
FEV6-%Pred-Pre: 83 %
FEV6-Post: 2.6 L
FEV6-Pre: 2.69 L
FEV6FVC-%Change-Post: 1 %
FEV6FVC-%Pred-Post: 108 %
FEV6FVC-%Pred-Pre: 107 %
FVC-%Change-Post: -4 %
FVC-%Pred-Post: 74 %
FVC-%Pred-Pre: 78 %
FVC-Post: 2.6 L
FVC-Pre: 2.73 L
Post FEV1/FVC ratio: 73 %
Post FEV6/FVC ratio: 100 %
Pre FEV1/FVC ratio: 68 %
Pre FEV6/FVC Ratio: 99 %
RV % pred: 72 %
RV: 2 L
TLC % pred: 76 %
TLC: 5.28 L

## 2016-05-18 LAB — POCT INR: INR: 1.5

## 2016-05-18 MED ORDER — ALBUTEROL SULFATE (2.5 MG/3ML) 0.083% IN NEBU
2.5000 mg | INHALATION_SOLUTION | Freq: Once | RESPIRATORY_TRACT | Status: AC
  Administered 2016-05-18: 2.5 mg via RESPIRATORY_TRACT

## 2016-05-23 ENCOUNTER — Ambulatory Visit (HOSPITAL_COMMUNITY): Payer: Medicare Other

## 2016-05-23 ENCOUNTER — Ambulatory Visit (HOSPITAL_COMMUNITY)
Admission: RE | Admit: 2016-05-23 | Discharge: 2016-05-23 | Disposition: A | Discharge status: Home / Self Care | Payer: Medicare Other | Source: Ambulatory Visit | Attending: Cardiovascular Disease | Admitting: Cardiovascular Disease

## 2016-05-23 ENCOUNTER — Encounter: Payer: Self-pay | Admitting: Physical Therapy

## 2016-05-23 ENCOUNTER — Ambulatory Visit: Payer: Medicare Other | Attending: Cardiovascular Disease | Admitting: Physical Therapy

## 2016-05-23 ENCOUNTER — Encounter (HOSPITAL_COMMUNITY): Payer: Self-pay

## 2016-05-23 DIAGNOSIS — I708 Atherosclerosis of other arteries: Secondary | ICD-10-CM | POA: Insufficient documentation

## 2016-05-23 DIAGNOSIS — Z952 Presence of prosthetic heart valve: Secondary | ICD-10-CM | POA: Diagnosis not present

## 2016-05-23 DIAGNOSIS — I350 Nonrheumatic aortic (valve) stenosis: Secondary | ICD-10-CM | POA: Insufficient documentation

## 2016-05-23 DIAGNOSIS — R293 Abnormal posture: Secondary | ICD-10-CM | POA: Diagnosis not present

## 2016-05-23 DIAGNOSIS — K7460 Unspecified cirrhosis of liver: Secondary | ICD-10-CM | POA: Diagnosis not present

## 2016-05-23 DIAGNOSIS — J90 Pleural effusion, not elsewhere classified: Secondary | ICD-10-CM | POA: Diagnosis not present

## 2016-05-23 DIAGNOSIS — I517 Cardiomegaly: Secondary | ICD-10-CM | POA: Diagnosis not present

## 2016-05-23 DIAGNOSIS — K6389 Other specified diseases of intestine: Secondary | ICD-10-CM | POA: Insufficient documentation

## 2016-05-23 DIAGNOSIS — R188 Other ascites: Secondary | ICD-10-CM | POA: Insufficient documentation

## 2016-05-23 DIAGNOSIS — I420 Dilated cardiomyopathy: Secondary | ICD-10-CM | POA: Diagnosis not present

## 2016-05-23 DIAGNOSIS — Z951 Presence of aortocoronary bypass graft: Secondary | ICD-10-CM | POA: Insufficient documentation

## 2016-05-23 DIAGNOSIS — R2689 Other abnormalities of gait and mobility: Secondary | ICD-10-CM | POA: Insufficient documentation

## 2016-05-23 DIAGNOSIS — K551 Chronic vascular disorders of intestine: Secondary | ICD-10-CM | POA: Insufficient documentation

## 2016-05-23 DIAGNOSIS — K5730 Diverticulosis of large intestine without perforation or abscess without bleeding: Secondary | ICD-10-CM | POA: Insufficient documentation

## 2016-05-23 MED ORDER — IOPAMIDOL (ISOVUE-370) INJECTION 76%
INTRAVENOUS | Status: AC
  Filled 2016-05-23: qty 50

## 2016-05-23 MED ORDER — IOPAMIDOL (ISOVUE-370) INJECTION 76%
INTRAVENOUS | Status: AC
  Administered 2016-05-23: 100 mL
  Filled 2016-05-23: qty 100

## 2016-05-23 NOTE — Therapy (Signed)
[place], [place], [address] Phone: [phone]   Fax:  [phone]  Physical Therapy Evaluation  Patient Details  Name: Tyler Williams MRN: [id number] Date of Birth: 01-23-1927 Referring Provider: Dr. Sherren Mocha  Encounter Date: 05/23/2016      PT End of Session - 05/23/16 0952    Visit Number 1   PT Start Time 0086   PT Stop Time 1038   PT Time Calculation (min) 46 min      Past Medical History:  Diagnosis Date  . AAA (abdominal aortic aneurysm) ([place])    s/p repair  . Aortic stenosis, mild    a. mean gradient 17 mmHg on 04/2012 TTE  . Arthritis    knees  . CAD (coronary artery disease)    a. s/p multiple percutaneous prior coronary artery interventions b. 05/2012: unsuccessful PCI to tight RCA->medically managed  . Carotid artery disease (HCC)    0-39% bilateral ICA stenoses 11/2011  . Chronic anticoagulation    Coumadin  . GERD (gastroesophageal reflux disease)   . GERD (gastroesophageal reflux disease)   . History of pulmonary embolism    1964 related to dislocated hip on right   . History of skin cancer   . Hypercholesteremia   . Hypertension   . Hypertension   . Microscopic hematuria    Followed by urology  . Permanent atrial fibrillation ([place])   . PVD (peripheral vascular disease) ([place])    Bilateral renal artery atherosclerosis, SMA stenosis with collateralization  . RBBB    Chronic  . SSS (sick sinus syndrome) [facility])     Past Surgical History:  Procedure Laterality Date  . ABDOMINAL AORTIC ANEURYSM REPAIR     1993   . CARDIAC CATHETERIZATION  05/24/2012   pD1 20%, oCFX 20%, mCFX 30%, ostial Int Br 30%, distal AV groove CFX 30%, pRCA 30-40%, mRCA 99%, dRCA 30%, PL branch small with diffuse 40%. Unable to pass wire past RCA lesion->medically managed  . CHOLECYSTECTOMY N/A 12/05/2012   Procedure: LAPAROSCOPIC CHOLECYSTECTOMY ;  Surgeon: Edward Jolly, MD;  Location: [place];  Service: General;  Laterality: N/A;  . [place]  . EYE SURGERY     hx of cataract surgery   . [place]  . HERNIA REPAIR     right inguinal hernia repair   . JOINT REPLACEMENT     left knee replacement, partial right  . LEFT HEART CATHETERIZATION WITH CORONARY ANGIOGRAM N/A 05/24/2012   Procedure: LEFT HEART CATHETERIZATION WITH CORONARY ANGIOGRAM;  Surgeon: Burnell Blanks, MD;  Location: [facility] CATH LAB;  Service: Cardiovascular;  Laterality: N/A;  . ORTHOPEDIC SURGERY    . OTHER SURGICAL HISTORY     right knee popliteal aneurysm surgery stent placed   . OTHER SURGICAL HISTORY    . PARTIAL KNEE ARTHROPLASTY  01/22/2012   Procedure: UNICOMPARTMENTAL KNEE;  Surgeon: Mauri Pole, MD;  [facility];  Service: Orthopedics;  Laterality: Right;  . PERCUTANEOUS CORONARY INTERVENTION-BALLOON ONLY  05/24/2012   Procedure: PERCUTANEOUS CORONARY INTERVENTION-BALLOON ONLY;  Surgeon: Burnell Blanks, MD;  Location: [facility] CATH LAB;  Service: Cardiovascular;;  . RIGHT/LEFT HEART CATH AND CORONARY ANGIOGRAPHY N/A 05/12/2016   Procedure: Right/Left Heart Cath and Coronary Angiography;  Surgeon: Sherren Mocha, MD;  Location: [facility] CV LAB;  Service: Cardiovascular;  Laterality: N/A;  . TONSILLECTOMY      There were no vitals filed for this  visit.       Subjective Assessment - 05/23/16 0956    Subjective Pt has been followed for years with annual echo studies and has been asymptomatic up until recently (2-3 months). Specifically in last 3-4 weeks, pt has noticed increased shortness of breath with walking outside, particularly uphill and is not able to walk as long/far. Pt reports minor chest tightness/light chest pain as well.    Patient Stated Goals stay active   Currently in Pain? No/denies            [facility] PT Assessment - 05/23/16 0001      Assessment   Medical Diagnosis severe aortic stenosis   Referring Provider Dr. Sherren Mocha   Onset Date/Surgical Date 04/22/16  approximate     Precautions   Precautions None     Restrictions   Weight Bearing Restrictions No     Balance Screen   Has the patient fallen in the past 6 months No   Has the patient had a decrease in activity level because of a fear of falling?  No   Is the patient reluctant to leave their home because of a fear of falling?  No     Home Ec[employer] residence   Living Arrangements Spouse/significant other   Home Access Stairs to enter   Entrance Stairs-Number of Steps 3   Entrance Stairs-Rails Right;Left;Cannot reach both   [place];Able to live on main level with bedroom/bathroom     Posture/Postural Control   Posture/Postural Control Postural limitations   Postural Limitations Forward head  mild     ROM / Strength   AROM / PROM / Strength AROM;Strength     AROM   Overall AROM Comments Grossly WNL     Strength   Overall Strength Comments Grossly 5/5 throughout   Strength Assessment Site Hand   Right/Left hand Right;Left   Right Hand Grip (lbs) 55  R hand dominant   Left Hand Grip (lbs) 56     Ambulation/Gait   Gait Comments Pt demonstrates wide BOS and decreased step length bil. Distance limited by 23% for age/gender.      6 Minute walk- Post Test   BP (mmHg) (!)  169/100  151/77 after 5 minutes rest          [facility] Pre-Surgical Assessment - 05/23/16 0001    5 Meter Walk Test- trial 1 6 sec   5 Meter Walk Test- trial 2 5 sec.    5 Meter Walk Test- trial 3 6 sec.   5 meter walk test average 5.67 sec   4 Stage Balance Test tolerated for:  2 sec.   4 Stage Balance Test Position 4   Sit To Stand Test- trial 1 12 sec.   ADL/IADL Independent with: Bathing;Dressing;Meal prep;Finances   ADL/IADL Needs Assistance with: [employer] [employer]aver work   ADL/IADL Fraility Index Vulnerable   6 Minute Walk- Baseline yes   BP (mmHg) 142/66   HR (bpm) 53   02 Sat (%RA) 97 %   Modified Borg Scale for  Dyspnea 0- Nothing at all   Perceived Rate of Exertion (Borg) 6-   6 Minute Walk Post Test yes   HR (bpm) 73   02 Sat (%RA) 94 %   Modified Borg Scale for Dyspnea 0.5- Very, very slight shortness of breath   Perceived Rate of Exertion (Borg) 9- very light   Aerobic Endurance Distance Walked 1045   Endurance additional comments no rest breaks  Plan - 06/03/16 1043    Clinical Impression Statement see below   PT Frequency One time visit   Consulted and Agree with Plan of Care Patient     Clinical Impression Statement: Pt is a 83 yo male presenting to OP PT for evaluation prior to possible TAVR surgery due to severe aortic stenosis. Pt reports onset of sudden worsening of shortness of breath and occasional chest tightness approximately 3-4 weeks ago. Symptoms are limiting his walking in the neighborhood, especially on inclines. Pt presents with good ROM and strength, good balance and is not at high fall risk 4 stage balance test, good walking speed and fair to poor aerobic endurance per 6 minute walk test. Pt ambulated a total of 1045 feet in 6 minute walk. BP increased significantly with 6 minute walk test, rising from 142/66 to 169/100. BP recovered to 151/77 after 5 minutes rest. Pt reported very slight shortness of breath with 6 minute walk. Based on the Short Physical Performance Battery, patient has a frailty rating of 11/12 with </= 5/12 considered frail.   Patient demonstrated the following deficits and impairments:     Visit Diagnosis: Abnormal posture  Other abnormalities of gait and mobility      G-Codes - 2016-06-03 1044    Functional Assessment Tool Used (Outpatient Only) 6 minute walk 1045   Functional Limitation Mobility: Walking and moving around   Mobility: Walking and Moving Around Current Status [phone]) At least 20 percent but less than 40 percent impaired, limited or restricted   Mobility: Walking and  Moving Around Goal Status [id number]) At least 20 percent but less than 40 percent impaired, limited or restricted   Mobility: Walking and Moving Around Discharge Status [phone]) At least 20 percent but less than 40 percent impaired, limited or restricted       Problem List Patient Active Problem List   Diagnosis Date Noted  . Encounter for therapeutic drug monitoring 03/20/2013  . Cholecystitis, acute 12/06/2012  . Chest pain, atypical 12/03/2012  . Hematuria, microscopic 12/03/2012  . Overweight (BMI 25.0-29.9) 01/23/2012  . Hyponatremia 01/23/2012  . S/P right UKR 01/22/2012  . Carotid artery bruit 11/21/2011  . Carotid bruit 11/21/2011  . Aortic stenosis 01/01/2011  . EDEMA 07/14/2009  . HYPERTENSION, BENIGN 01/27/2009  . HYPERCHOLESTEROLEMIA  IIA 10/21/2008  . CAD, NATIVE VESSEL 10/21/2008  . Atrial fibrillation ([place]) 10/21/2008    Wing, PT Jun 03, 2016, 10:51 AM  [facility] [address] [place], [place], [address] Phone: [phone]   Fax:  [phone]  Name: Tyler Williams MRN: [id number] Date of Birth: [birth year]

## 2016-05-24 DIAGNOSIS — K6389 Other specified diseases of intestine: Secondary | ICD-10-CM | POA: Insufficient documentation

## 2016-05-24 HISTORY — DX: Other specified diseases of intestine: K63.89

## 2016-05-25 ENCOUNTER — Ambulatory Visit (INDEPENDENT_AMBULATORY_CARE_PROVIDER_SITE_OTHER): Payer: Medicare Other | Admitting: *Deleted

## 2016-05-25 DIAGNOSIS — Z5181 Encounter for therapeutic drug level monitoring: Secondary | ICD-10-CM

## 2016-05-25 DIAGNOSIS — I6523 Occlusion and stenosis of bilateral carotid arteries: Secondary | ICD-10-CM | POA: Diagnosis not present

## 2016-05-25 DIAGNOSIS — I4891 Unspecified atrial fibrillation: Secondary | ICD-10-CM

## 2016-05-25 LAB — POCT INR: INR: 2.4

## 2016-05-30 ENCOUNTER — Encounter: Payer: Self-pay | Admitting: Thoracic Surgery (Cardiothoracic Vascular Surgery)

## 2016-05-30 ENCOUNTER — Other Ambulatory Visit: Payer: Self-pay | Admitting: *Deleted

## 2016-05-30 ENCOUNTER — Institutional Professional Consult (permissible substitution) (INDEPENDENT_AMBULATORY_CARE_PROVIDER_SITE_OTHER): Payer: Medicare Other | Admitting: Thoracic Surgery (Cardiothoracic Vascular Surgery)

## 2016-05-30 VITALS — BP 149/79 | HR 56 | Resp 16 | Ht 69.0 in | Wt 191.0 lb

## 2016-05-30 DIAGNOSIS — I6523 Occlusion and stenosis of bilateral carotid arteries: Secondary | ICD-10-CM | POA: Diagnosis not present

## 2016-05-30 DIAGNOSIS — I209 Angina pectoris, unspecified: Secondary | ICD-10-CM

## 2016-05-30 DIAGNOSIS — I25118 Atherosclerotic heart disease of native coronary artery with other forms of angina pectoris: Secondary | ICD-10-CM

## 2016-05-30 DIAGNOSIS — K639 Disease of intestine, unspecified: Secondary | ICD-10-CM | POA: Diagnosis not present

## 2016-05-30 DIAGNOSIS — K6389 Other specified diseases of intestine: Secondary | ICD-10-CM

## 2016-05-30 DIAGNOSIS — I350 Nonrheumatic aortic (valve) stenosis: Secondary | ICD-10-CM | POA: Diagnosis not present

## 2016-05-30 NOTE — Patient Instructions (Addendum)
Stop taking warfarin after you take your dose on Thursday April 26th  Continue taking all other medications without change through the day before surgery.  Have nothing to eat or drink after midnight the night before surgery.  On the morning of surgery take only Toprol, Norvasc and Altace with a sip of water.

## 2016-05-30 NOTE — Progress Notes (Signed)
HEART AND [place] SURGERY CONSULTATION REPORT  Referring Provider is Sherren Mocha, MD PCP is Geoffery Lyons, MD  Chief Complaint  Patient presents with  . Aortic Stenosis    eval for AVR vs TAVR...CATH 3/30, ECHO 4/5, CTA C/A/P, CARD MORPH 4/10  . Follow-up    EXERCISE 4/10, PFT 4/5    HPI:  Patient is an 83 year old male with history of aortic stenosis, coronary artery disease, long-standing persistent atrial fibrillation on chronic warfarin anticoagulation, GE reflux disease, and abdominal aortic aneurysm status post open surgical repair in the distant past who has been referred for surgical consultation to discuss treatment options for management of severe symptomatic aortic stenosis. Patient has long history of heart murmur and aortic stenosis discovered nearly 20 years ago when he first presented with atrial fibrillation. He developed symptomatic coronary artery disease and underwent percutaneous coronary intervention on multiple occasions in the past. He has been followed for the last several years by Dr. Burt Knack.  In 2014 he was hospitalized for unstable angina. Diagnostic cardiac catheterization revealed 99% stenosis of mid right coronary artery without any significant disease in the left coronary system. PCI was attempted but unsuccessful because of severe calcification and chronicity of disease.  Since then the patient has done remarkably well on long-term medical therapy. Serial echocardiograms have documented a gradual progression severity of aortic stenosis. The patient recently developed fairly sudden onset of exertional shortness of breath without chest discomfort. He has remained remarkably active physically, but recently when on a walk he experienced a prolonged episode of shortness of breath that developed when he was trying to walk up a hill. He recovered fairly quickly with rest. Since then he has noticed  decreased energy and intermittent episodes of exertional shortness of breath. He does not get short of breath with ordinary activities around the house and he has never had any resting shortness of breath or chest discomfort. He has chronic history of lower extremity edema that is at least partially related to degenerative arthritis involving both knees with previous bilateral knee surgery.  He was seen in follow-up recently by Dr. Burt Knack following the onset of symptoms of exertional shortness of breath. He underwent repeat diagnostic cardiac catheterization on 05/12/2016 revealing severe stenosis of the mid right coronary artery with heavy calcification, unchanged from the last previous diagnostic catheterization performed in 2014. There remained only minor nonobstructive coronary artery disease in the left coronary circulation. There was severe aortic stenosis with mean transvalvular gradient measured 35 mmHg by catheterization. The patient subsequently underwent follow-up echocardiogram 05/18/2016 which confirmed the presence of severe aortic stenosis with normal left ventricular systolic function. The patient subsequently underwent CT angiography and has been referred for elective surgical consultation.  The patient is married and lives with his wife locally in [place]. Despite his advanced age he has remained remarkably active. 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 Up until recently he has continued to enjoy playing golf, walking on a regular basis, and gardening.  He has only recently developed symptoms of exertional shortness of breath and fatigue as noted previously. He reports no other significant physical limitations.  Past Medical History:  Diagnosis Date  . AAA (abdominal aortic aneurysm) ([place])    s/p repair  . Aortic stenosis, mild    a. mean gradient 17 mmHg on 04/2012 TTE  . Arthritis    knees  . CAD (coronary artery disease)  a.  s/p multiple percutaneous prior coronary artery interventions b. 05/2012: unsuccessful PCI to tight RCA->medically managed  . Carotid artery disease ([facility])    0-39% bilateral ICA stenoses 11/2011  . Chronic anticoagulation    Coumadin  . GERD (gastroesophageal reflux disease)   . GERD (gastroesophageal reflux disease)   . History of pulmonary embolism    1964 related to dislocated hip on right   . History of skin cancer   . Hypercholesteremia   . Hypertension   . Hypertension   . Microscopic hematuria    Followed by urology  . Permanent atrial fibrillation ([place])   . PVD (peripheral vascular disease) ([place])    Bilateral renal artery atherosclerosis, SMA stenosis with collateralization  . RBBB    Chronic  . SSS (sick sinus syndrome) [facility])     Past Surgical History:  Procedure Laterality Date  . ABDOMINAL AORTIC ANEURYSM REPAIR     1993   . CARDIAC CATHETERIZATION  05/24/2012   pD1 20%, oCFX 20%, mCFX 30%, ostial Int Br 30%, distal AV groove CFX 30%, pRCA 30-40%, mRCA 99%, dRCA 30%, PL branch small with diffuse 40%. Unable to pass wire past RCA lesion->medically managed  . CHOLECYSTECTOMY N/A 12/05/2012   Procedure: LAPAROSCOPIC CHOLECYSTECTOMY ;  Surgeon: Edward Jolly, MD;  Location: [facility];  Service: General;  Laterality: N/A;  . [place]  . EYE SURGERY     hx of cataract surgery   . [place]  . HERNIA REPAIR     right inguinal hernia repair   . JOINT REPLACEMENT     left knee replacement, partial right  . LEFT HEART CATHETERIZATION WITH CORONARY ANGIOGRAM N/A 05/24/2012   Procedure: LEFT HEART CATHETERIZATION WITH CORONARY ANGIOGRAM;  Surgeon: Burnell Blanks, MD;  Location: [facility] CATH LAB;  Service: Cardiovascular;  Laterality: N/A;  . ORTHOPEDIC SURGERY    . OTHER SURGICAL HISTORY     right knee popliteal aneurysm surgery stent placed   . OTHER SURGICAL HISTORY    . PARTIAL KNEE ARTHROPLASTY  01/22/2012   Procedure:  UNICOMPARTMENTAL KNEE;  Surgeon: Mauri Pole, MD;  Location: [facility] ORS;  Service: Orthopedics;  Laterality: Right;  . PERCUTANEOUS CORONARY INTERVENTION-BALLOON ONLY  05/24/2012   Procedure: PERCUTANEOUS CORONARY INTERVENTION-BALLOON ONLY;  Surgeon: Burnell Blanks, MD;  Location[facility] LAB;  Service: Cardiovascular;;  . RIGHT/LEFT HEART CATH AND CORONARY ANGIOGRAPHY N/A 05/12/2016   Procedure: Right/Left Heart Cath and Coronary Angiography;  Surgeon: Sherren Mocha, MD;  Location: [facility] CV LAB;  Service: Cardiovascular;  Laterality: N/A;  . TONSILLECTOMY      Family History  Problem Relation Age of Onset  . Heart attack Mother 30    Social History   Social History  . Marital status: Married    Spouse name: N/A  . Number of children: N/A  . Years of education: N/A   Occupational History  . Not on file.   Social History Main Topics  . Smoking status: Former Smoker    Types: Cigarettes    Quit date: 02/14/1983  . Smokeless tobacco: Never Used  . Alcohol use 4.2 oz/week    7 Glasses of wine per week     Comment: 5 ounces of wine daily   . Drug use: No  . Sexual activity: Not on file   Other Topics Concern  . Not on file   Social History Narrative  . No narrative on file  Current Outpatient Prescriptions  Medication Sig Dispense Refill  . amLODipine (NORVASC) 5 MG tablet Take 5 mg by mouth daily.    Marland Kitchen atorvastatin (LIPITOR) 20 MG tablet TAKE 1 TABLET ONCE DAILY. 30 tablet 11  . Calcium-Magnesium-Zinc (CAL-MAG-ZINC PO) Take 1 tablet by mouth daily.    . Cholecalciferol (VITAMIN D-3) 5000 UNITS TABS Take 5,000 Units by mouth daily.     . Coenzyme Q10 (CO Q 10) 100 MG CAPS Take 1 capsule by mouth every morning.     . hydrochlorothiazide (MICROZIDE) 12.5 MG capsule TAKE (1) CAPSULE DAILY. 30 capsule 11  . ibuprofen (ADVIL,MOTRIN) 200 MG tablet Take 400 mg by mouth every 6 (six) hours as needed for pain.    . isosorbide mononitrate (IMDUR) 60 MG 24 hr tablet  TAKE 1 TABLET EACH DAY. 30 tablet 11  . LORazepam (ATIVAN) 1 MG tablet Take 1 mg by mouth at bedtime.     . metoprolol succinate (TOPROL-XL) 50 MG 24 hr tablet Take 50 mg by mouth daily. Take with or immediately following a meal.     . Multiple Vitamins-Minerals (PRESERVISION AREDS) CAPS Take 1 capsule by mouth daily.    . nitroGLYCERIN (NITROSTAT) 0.4 MG SL tablet Place 1 tablet (0.4 mg total) under the tongue every 5 (five) minutes x 3 doses as needed for chest pain. 30 tablet 4  . omega-3 acid ethyl esters (LOVAZA) 1 G capsule Take 1 g by mouth daily.    . ramipril (ALTACE) 10 MG capsule Take 10 mg by mouth 2 (two) times daily.    . Ranibizumab (LUCENTIS) 0.5 MG/0.05ML SOLN Inject 0.5 mg into the eye See admin instructions. Pt gets this injection every 9 weeks. Next treatment is due on 01-18-12. Pt is followed by [facility].  Gets 1 shot at a time, one week apart.    . vitamin B-12 (CYANOCOBALAMIN) 1000 MCG tablet Take 1,000 mcg by mouth daily.    Marland Kitchen warfarin (COUMADIN) 5 MG tablet Take 5-7.5 mg by mouth See admin instructions. 7.5 mg on Mondays. 5 mg all other days     No current facility-administered medications for this visit.     No Known Allergies    Review of Systems:   General:  normal appetite, decreased energy, no weight gain, no weight loss, no fever  Cardiac:  no chest pain with exertion, no chest pain at rest, + SOB with exertion, no resting SOB, no PND, no orthopnea, no palpitations, + arrhythmia, + atrial fibrillation, + LE edema, no dizzy spells, no syncope  Respiratory:  + exertional shortness of breath, no home oxygen, no productive cough, no dry cough, no bronchitis, no wheezing, no hemoptysis, no asthma, no pain with inspiration or cough, no sleep apnea, no CPAP at night  GI:   no difficulty swallowing, + reflux, no frequent heartburn, no hiatal hernia, no abdominal pain, + constipation, no diarrhea, no hematochezia, no hematemesis, no melena  GU:   no  dysuria,  no frequency, no urinary tract infection, no hematuria, no enlarged prostate, + kidney stones, no kidney disease  Vascular:  no pain suggestive of claudication, no pain in feet, no leg cramps, + varicose veins, no DVT, no non-healing foot ulcer  Neuro:   no stroke, no TIA's, no seizures, no headaches, no temporary blindness one eye,  no slurred speech, no peripheral neuropathy, no chronic pain, no instability of gait, no memory/cognitive dysfunction  Musculoskeletal: mild arthritis, no joint swelling, no myalgias, no difficulty walking, normal mobility  Skin:   no rash, no itching, no skin infections, no pressure sores or ulcerations  Psych:   no anxiety, no depression, no nervousness, no unusual recent stress  Eyes:   no blurry vision, no floaters, no recent vision changes, + wears glasses or contacts  ENT:   no hearing loss, no loose or painful teeth, no dentures, last saw dentist January 2018  Hematologic:  + easy bruising, no abnormal bleeding, no clotting disorder, no frequent epistaxis  Endocrine:  no diabetes, does not check CBG's at home           Physical Exam:   BP (!) 149/79 (BP Location: Right Arm, Patient Position: Sitting, Cuff Size: Large)   Pulse (!) 56   Resp 16   Ht 5\' 9"  (1.753 m)   Wt 191 lb (86.6 kg)   SpO2 96% Comment: ON RA  BMI 28.21 kg/m   General:    well-appearing, looks younger than stated age  HEENT:  Unremarkable   Neck:   no JVD, no bruits, no adenopathy   Chest:   clear to auscultation, symmetrical breath sounds, no wheezes, no rhonchi   CV:   RRR, grade IV/VI crescendo/decrescendo murmur heard best at RSB,  no diastolic murmur  Abdomen:  soft, non-tender, no masses   Extremities:  warm, well-perfused, pulses palpable, no LE edema  Rectal/GU  Deferred  Neuro:   Grossly non-focal and symmetrical throughout  Skin:   Clean and dry, no rashes, no breakdown   Diagnostic Tests:  Right/Left Heart Cath and Coronary Angiography  Conclusion    1. Severe RCA stenosis with heavy calcification unchanged from 2014 study 2. Minor nonobstructive LAD and LCx stenosis 3. Severely calcified aortic valve with severe aortic stenosis (mean gradient 35 mmHg and AVA 1.06 square cm)  Recommend: Further evaluation by the multidisciplinary heart team for consideration of TAVR/PCI or other treatment options. Will obtain updated echo and CTA studies.  Indications   Severe aortic stenosis [I35.0 (ICD-10-CM)]  Procedural Details/Technique   Technical Details INDICATION: Stage D, symptomatic aortic stenosis. 83 yo male with known CAD, failed attempt at RCA PCI in 2014, has developed progressive dyspnea and found to have moderate/severe aortic stenosis. Presents for R/L heart catheterization for further evaluation.  PROCEDURAL DETAILS: There was an indwelling IV in a right antecubital vein. Using normal sterile technique, the IV was changed out for a 5 Fr brachial sheath over a 0.018 inch wire. The right wrist was then prepped, draped, and anesthetized with 1% lidocaine. I was able to pass a wire into the vein but never could advance a catheter up the arm. Attention was turned to the left groin. A front wall venous puncture was performed and a 7 Fr sheath was advanced. A swan-ganz catheter was used for the right heart catheterization.  Using the modified Seldinger technique a 5/6 French Slender sheath was placed in the left radial artery. Intra-arterial verapamil was administered through the radial artery sheath. IV heparin was administered after a JR4 catheter was advanced into the central aorta. Standard Judkins catheters were used for selective coronary angiography and aortic angiography. An AL-1 catheter and straight wire were used to cross the aortic valve. There were no immediate procedural complications. The patient was transferred to the post catheterization recovery area for further monitoring.    Estimated blood loss <50 mL.  During this  procedure the patient was administered the following to achieve and maintain moderate conscious sedation: Versed 2 mg, Fentanyl 25 mcg, while the patient's  heart rate, blood pressure, and oxygen saturation were continuously monitored.    Coronary Findings   Dominance: Right  Left Anterior Descending  There is mild the vessel.  Mid LAD to Dist LAD lesion, 25% stenosed.  Ramus Intermedius  The vessel exhibits minimal luminal irregularities.  Left Circumflex  There is mild the vessel.  Mid Cx lesion, 30% stenosed.  Right Coronary Artery  Prox RCA to Mid RCA lesion, 40% stenosed. The lesion is severely calcified.  Mid RCA lesion, 95% stenosed. The lesion is severely calcified.  Dist RCA lesion, 40% stenosed. The lesion is moderately calcified.  Right Heart   Right Heart Pressures Hemodynamic findings consistent with aortic valve stenosis. Aortic Valve: mean gradient 35 mmHg and calculated aortic valve area 1.06 square cm    Coronary Diagrams   Diagnostic Diagram       Implants     No implant documentation for this case.  PACS Images   Show images for Cardiac catheterization   Link to Procedure Log   Procedure Log    Hemo Data    Most Recent Value  Fick Cardiac Output 5.81 L/min  Fick Cardiac Output Index 2.89 (L/min)/BSA  Aortic Mean Gradient 34.7 mmHg  Aortic Peak Gradient 34 mmHg  Aortic Valve Area 1.06  Aortic Value Area Index 0.53 cm2/BSA  RA A Wave -99 mmHg  RA V Wave 13 mmHg  RA Mean 12 mmHg  RV Systolic Pressure 51 mmHg  RV Diastolic Pressure 10 mmHg  RV EDP 14 mmHg  PA Systolic Pressure 53 mmHg  PA Diastolic Pressure 24 mmHg  PA Mean 34 mmHg  PW A Wave -99 mmHg  PW V Wave 32 mmHg  PW Mean 22 mmHg  AO Systolic Pressure 106 mmHg  AO Diastolic Pressure 63 mmHg  AO Mean 91 mmHg  LV Systolic Pressure 269 mmHg  LV Diastolic Pressure 12 mmHg  LV EDP 17 mmHg  Arterial Occlusion Pressure Extended Systolic Pressure 485 mmHg  Arterial Occlusion Pressure  Extended Diastolic Pressure 55 mmHg  Arterial Occlusion Pressure Extended Mean Pressure 88 mmHg  Left Ventricular Apex Extended Systolic Pressure 462 mmHg  Left Ventricular Apex Extended Diastolic Pressure 15 mmHg  Left Ventricular Apex Extended EDP Pressure 19 mmHg  QP/QS 1  TPVR Index 11.75 HRUI  TSVR Index 31.46 HRUI  PVR SVR Ratio 0.15  TPVR/TSVR Ratio 0.37      Transthoracic Echocardiography  Patient:    Tyler Williams, Tyler Williams MR #:       [id number] Study Date: 05/18/2016 Gender:     M Age:        89 Height:     175.3 cm Weight:     84.8 kg BSA:        2.05 m^2 Pt. Status: Room:   [place], [place]    Sherren Mocha, MD  ORDERING     Sherren Mocha, MD  [place], MD  PERFORMING   [facility], Outpatient  SONOGRAPHER  [facility], RDCS  cc:  ------------------------------------------------------------------- LV EF: 55% -   60%  ------------------------------------------------------------------- Indications:      Aortic Stenosis (I35.0).  ------------------------------------------------------------------- History:   PMH:   Atrial fibrillation.  Coronary artery disease. Risk factors:  Sick Sinus Syndrome, Pulmonary Embolism, Peripheral Vascular Disease, Right Bundle Branch Block, Abdominal Aortic Aneurysm s/p Repair  Family history of coronary artery disease. Former tobacco use. Hypertension. Dyslipidemia.  ------------------------------------------------------------------- Study Conclusions  - Left ventricle: The cavity size was  normal. Wall thickness was   normal. Systolic function was normal. The estimated ejection   fraction was in the range of 55% to 60%. Wall motion was normal;   there were no regional wall motion abnormalities. - Aortic valve: There was severe stenosis. Valve area (VTI): 0.88   cm^2. Valve area (Vmax): 0.88 cm^2. Valve area (Vmean): 0.77   cm^2. - Mitral valve: Calcified annulus.  Mildly thickened leaflets .   There was mild regurgitation. Valve area by pressure half-time:   1.93 cm^2. Valve area by continuity equation (using LVOT flow):   3.39 cm^2. - Left atrium: The atrium was severely dilated. - Right atrium: The atrium was moderately to severely dilated. - Pulmonary arteries: Systolic pressure was moderately increased.   PA peak pressure: 60 mm Hg (S).  ------------------------------------------------------------------- Study data:  Comparison was made to the study of 09/30/2015.  Study status:  Routine.  Procedure:  Transthoracic echocardiography. Image quality was adequate.          Transthoracic echocardiography.  M-mode, complete 2D, spectral Doppler, and color Doppler.  Birthdate:  Patient birthdate: 25-Apr-1926.  Age:  Patient is 83 yr old.  Sex:  Gender: male.    BMI: 27.6 kg/m^2.  Blood pressure:     146/76  Patient status:  Outpatient.  Study date: Study date: 05/18/2016. Study time: 08:57 AM.  Location:  [facility]  -------------------------------------------------------------------  ------------------------------------------------------------------- Left ventricle:  The cavity size was normal. Wall thickness was normal. Systolic function was normal. The estimated ejection fraction was in the range of 55% to 60%. Wall motion was normal; there were no regional wall motion abnormalities. The study was not technically sufficient to allow evaluation of LV diastolic dysfunction due to atrial fibrillation.  ------------------------------------------------------------------- Aortic valve:   Probably trileaflet; mildly thickened, mildly calcified leaflets.  Doppler:   There was severe stenosis.   The jet is still early peaking.    VTI ratio of LVOT to aortic valve: 0.2. Valve area (VTI): 0.88 cm^2. Indexed valve area (VTI): 0.43 cm^2/m^2. Peak velocity ratio of LVOT to aortic valve: 0.2. Valve area (Vmax): 0.88 cm^2. Indexed valve area  (Vmax): 0.43 cm^2/m^2. Mean velocity ratio of LVOT to aortic valve: 0.17. Valve area (Vmean): 0.77 cm^2. Indexed valve area (Vmean): 0.38 cm^2/m^2. Mean gradient (S): 32 mm Hg. Peak gradient (S): 59 mm Hg.  ------------------------------------------------------------------- Aorta:  Aortic root: The aortic root was normal in size. Ascending aorta: The ascending aorta was normal in size.  ------------------------------------------------------------------- Mitral valve:   Calcified annulus. Mildly thickened leaflets . Doppler:  There was mild regurgitation.    Valve area by pressure half-time: 1.93 cm^2. Indexed valve area by pressure half-time: 0.94 cm^2/m^2. Valve area by continuity equation (using LVOT flow): 3.39 cm^2. Indexed valve area by continuity equation (using LVOT flow): 1.65 cm^2/m^2.    Mean gradient (D): 2 mm Hg. Peak gradient (D): 6 mm Hg.  ------------------------------------------------------------------- Left atrium:  The atrium was severely dilated.  ------------------------------------------------------------------- Right ventricle:  The cavity size was normal. Systolic function was normal.  ------------------------------------------------------------------- Pulmonic valve:   Poorly visualized.  The valve appears to be grossly normal.    Doppler:  There was mild regurgitation.  ------------------------------------------------------------------- Tricuspid valve:   Structurally normal valve.   Leaflet separation was normal.  Doppler:  Transvalvular velocity was within the normal range. There was mild regurgitation.  ------------------------------------------------------------------- Pulmonary artery:   Systolic pressure was moderately increased.  ------------------------------------------------------------------- Right atrium:  The atrium was moderately to severely  dilated.  -------------------------------------------------------------------  Pericardium:  There was no pericardial effusion.  ------------------------------------------------------------------- Systemic veins: Inferior vena cava: The vessel was dilated. The respirophasic diameter changes were blunted (< 50%), consistent with elevated central venous pressure.  ------------------------------------------------------------------- Measurements   Left ventricle                           Value          Reference  LV ID, ED, PLAX chordal                  43.4  mm       43 - 52  LV ID, ES, PLAX chordal                  24.2  mm       23 - 38  LV fx shortening, PLAX chordal           44    %        >=29  LV PW thickness, ED                      10.8  mm       ----------  IVS/LV PW ratio, ED                      1.14           <=1.3  Stroke volume, 2D                        118   ml       ----------  Stroke volume/bsa, 2D                    58    ml/m^2   ----------  LV e&', lateral                           7.18  cm/s     ----------  LV E/e&', lateral                         17.41          ----------  LV e&', medial                            6.64  cm/s     ----------  LV E/e&', medial                          18.83          ----------  LV e&', average                           6.91  cm/s     ----------  LV E/e&', average                         18.09          ----------    Ventricular septum                       Value          Reference  IVS thickness, ED  12.3  mm       ----------    LVOT                                     Value          Reference  LVOT ID, S                               23.7  mm       ----------  LVOT area                                4.41  cm^2     ----------  LVOT peak velocity, S                    76.15 cm/s     ----------  LVOT mean velocity, S                    45.3  cm/s     ----------  LVOT VTI, S                               20.76 cm       ----------  Stroke volume (SV), LVOT DP              91.6  ml       ----------  Stroke index (SV/bsa), LVOT DP           44.7  ml/m^2   ----------    Aortic valve                             Value          Reference  Aortic valve peak velocity, S            383   cm/s     ----------  Aortic valve mean velocity, S            260   cm/s     ----------  Aortic valve VTI, S                      105   cm       ----------  Aortic mean gradient, S                  32    mm Hg    ----------  Aortic peak gradient, S                  59    mm Hg    ----------  VTI ratio, LVOT/AV                       0.2            ----------  Aortic valve area, VTI                   0.88  cm^2     ----------  Aortic valve area/bsa, VTI               0.43  cm^2/m^2 ----------  Velocity ratio, peak, LVOT/AV  0.2            ----------  Aortic valve area, peak velocity         0.88  cm^2     ----------  Aortic valve area/bsa, peak              0.43  cm^2/m^2 ----------  velocity  Velocity ratio, mean, LVOT/AV            0.17           ----------  Aortic valve area, mean velocity         0.77  cm^2     ----------  Aortic valve area/bsa, mean              0.38  cm^2/m^2 ----------  velocity    Aorta                                    Value          Reference  Aortic root ID, ED                       31    mm       ----------  Ascending aorta ID, A-P, S               35    mm       ----------    Left atrium                              Value          Reference  LA ID, A-P, ES                           54    mm       ----------  LA ID/bsa, A-P                   (H)     2.64  cm/m^2   <=2.2  LA volume, S                             91.5  ml       ----------  LA volume/bsa, S                         44.7  ml/m^2   ----------  LA volume, ES, 1-p A4C                   97.8  ml       ----------  LA volume/bsa, ES, 1-p A4C               47.7  ml/m^2   ----------  LA volume, ES, 1-p A2C                    85.6  ml       ----------  LA volume/bsa, ES, 1-p A2C               41.8  ml/m^2   ----------    Mitral valve  Value          Reference  Mitral E-wave peak velocity              125   cm/s     ----------  Mitral mean velocity, D                  56.7  cm/s     ----------  Mitral deceleration time                 173   ms       150 - 230  Mitral pressure half-time                114   ms       ----------  Mitral mean gradient, D                  2     mm Hg    ----------  Mitral peak gradient, D                  6     mm Hg    ----------  Mitral valve area, PHT, DP               1.93  cm^2     ----------  Mitral valve area/bsa, PHT, DP           0.94  cm^2/m^2 ----------  Mitral valve area, LVOT                  3.39  cm^2     ----------  continuity  Mitral valve area/bsa, LVOT              1.65  cm^2/m^2 ----------  continuity  Mitral annulus VTI, D                    34.7  cm       ----------  Mitral regurg VTI, PISA                  232   cm       ----------    Pulmonary arteries                       Value          Reference  PA pressure, S, DP               (H)     60    mm Hg    <=30    Tricuspid valve                          Value          Reference  Tricuspid regurg peak velocity           337   cm/s     ----------  Tricuspid peak RV-RA gradient            45    mm Hg    ----------    Right atrium                             Value          Reference  RA ID, S-I, ES, A4C              (H)     66  mm       34 - 49  RA area, ES, A4C                 (H)     26.7  cm^2     8.3 - 19.5  RA volume, ES, A/L                       88    ml       ----------  RA volume/bsa, ES, A/L                   42.9  ml/m^2   ----------    Systemic veins                           Value          Reference  Estimated CVP                            15    mm Hg    ----------    Right ventricle                          Value          Reference  TAPSE                                     16    mm       ----------  RV pressure, S, DP               (H)     60    mm Hg    <=30  RV s&', lateral, S                        10.8  cm/s     ----------  Legend: (L)  and  (H)  mark values outside specified reference range.  ------------------------------------------------------------------- Prepared and Electronically Authenticated by  Sanda Klein, MD 2018-04-05T14:47:49   STS Risk Calculator  Procedure    AVR + CABG  Risk of Mortality   2.98% Morbidity or Mortality  17.8% Prolonged LOS   9.4% Short LOS    24.1% Permanent Stroke   1.7% Prolonged Vent Support  10.1% DSW Infection    0.3% Renal Failure    5.0% Reoperation    8.3%   Cardiac TAVR CT  TECHNIQUE: The patient was scanned on a Philips 256 scanner. A 120 kV retrospective scan was triggered in the descending thoracic aorta at 111 HU's. Gantry rotation speed was 270 msecs and collimation was .9 mm. No beta blockade or nitro were given. The 3D data set was reconstructed in 5% intervals of the R-R cycle. Systolic and diastolic phases were analyzed on a dedicated work station using MPR, MIP and VRT modes. The patient received 80 cc of contrast.  FINDINGS: Aortic Valve: Trileaflet, severely thickened and symmetrically severely calcified aortic valve with severely restricted leaflet opening. There are minimal calcifications extending into the LVOT.  Aorta: Normal size, moderate diffuse calcifications with severe atherosclerosis. No dissection.  Sinotubular Junction:  30 x 27 mm  Ascending Thoracic Aorta:  32 x 32 mm  Aortic Arch:  29 x 25 mm  Descending Thoracic Aorta:  28 x 26 mm  Sinus of Valsalva Measurements:  Non-coronary:  33 mm  Right -coronary:  34 mm  Left -coronary:  36 mm  Coronary Artery Height above Annulus:  Left Main:  15 mm  Right Coronary:  20 mm  Virtual Basal Annulus Measurements:  Maximum/Minimum Diameter:  31 x 26 mm  Perimeter:   106 mm  Area:  622 mm2  Optimum Fluoroscopic Angle for Delivery:  LAO5 CAU 5  IMPRESSION: 1. Trileaflet, severely thickened and symmetrically severely calcified aortic valve with severely restricted leaflet opening. There are minimal calcifications extending into the LVOT. Annular measurements suitable for delivery of a 29 mm Edwards-SAPIEN 3 TAVR valve.  2.  Sufficient annulus to coronary distance.  3.  Optimum Fluoroscopic Angle for Delivery:  LAO5 CAU 5.  4. There is a filling defect in the tip of a large left atrial appendage. This might represent lack of contrast or a thrombus. A TEE prior to the procedure is recommended.  Ena Dawley   Electronically Signed   By: Ena Dawley   On: 05/24/2016 08:53   CT ANGIOGRAPHY CHEST, ABDOMEN AND PELVIS  TECHNIQUE: Multidetector CT imaging through the chest, abdomen and pelvis was performed using the standard protocol during bolus administration of intravenous contrast. Multiplanar reconstructed images and MIPs were obtained and reviewed to evaluate the vascular anatomy.  CONTRAST:  75 mL of Isovue 370.  COMPARISON:  CT the abdomen and pelvis 09/27/2013. Chest CT 12/03/2012.  FINDINGS: CTA CHEST FINDINGS  Cardiovascular: Heart size is mildly enlarged with left atrial dilatation. There is no significant pericardial fluid, thickening or pericardial calcification. Large filling defect in the tip of the left atrial appendage, highly concerning for left atrial appendage thrombus. There is aortic atherosclerosis, as well as atherosclerosis of the great vessels of the mediastinum and the coronary arteries, including calcified atherosclerotic plaque in the left main, left anterior descending, left circumflex and right coronary arteries. Severe thickening calcification of the aortic valve.  Mediastinum/Lymph Nodes: No pathologically enlarged mediastinal or hilar lymph nodes. Esophagus is unremarkable in  appearance. No axillary lymphadenopathy.  Lungs/Pleura: Small to moderate right pleural effusion which appears likely partially loculated. Trace left pleural effusion lying dependently. Areas of scarring and/or subsegmental atelectasis are noted in the right lower lobe. Thin-walled cyst in the periphery of the right lower lobe again noted. No suspicious appearing pulmonary nodules or masses are noted. No acute consolidative airspace disease.  Musculoskeletal/Soft Tissues: There are no aggressive appearing lytic or blastic lesions noted in the visualized portions of the skeleton.  CTA ABDOMEN AND PELVIS FINDINGS  Hepatobiliary: Liver has a shrunken appearance and nodular contour, indicative of underlying cirrhosis. 1.3 cm low-attenuation lesion in the inferior aspect of segment 5 is compatible with a simple cyst. Subcentimeter low-attenuation lesion in segment 4A is too small to characterize, but likely a tiny cyst. No other suspicious appearing hepatic lesions are noted. No intra or extrahepatic biliary ductal dilatation. Status post cholecystectomy.  Pancreas: No pancreatic mass. No pancreatic ductal dilatation. No pancreatic or peripancreatic fluid or inflammatory changes.  Spleen: Unremarkable.  Adrenals/Urinary Tract: There are multiple low-attenuation lesions in both kidneys, compatible with simple cysts, measuring up to 4.1 cm in diameter in the anterior aspect of the interpolar region of the left kidney. Other subcentimeter low-attenuation lesions in both kidneys are too small to definitively characterize, but are also favored to represent small cysts. Bilateral adrenal glands are normal in appearance. No hydroureteronephrosis. Urinary bladder is unremarkable in appearance.  Stomach/Bowel:  The appearance of the stomach is normal. There is no pathologic dilatation of small bowel or colon. In the cecum adjacent to the ileocecal valve there is a 4.6 x 5.6 x 3.7 cm  mass-like lesion (axial image 197 of series 401 and coronal image 26 of series 403), which could represent a large polyp or malignant lesion. Several colonic diverticulae are noted, particularly in the descending colon and sigmoid colon, without surrounding inflammatory changes to suggest an acute diverticulitis at this time. Normal appendix.  Vascular/Lymphatic: Vascular findings and measurements pertinent to potential TAVR procedure, as detailed below. Notably, the patient has undergone aorto bi-iliac bypass surgery with an aorto bi-iliac bypass graft which extends from the infrarenal abdominal aorta to the external iliac arteries bilaterally. The graft is widely patent. There are portions of the native external iliac artery use which remain patent and communicate with the remaining pelvic vasculature, which should be noted prior to retrograde placement of catheters at time of attempted TAVR. 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 Celiac axis is widely patent. Inferior mesenteric artery is patent, although the origin and proximal aspect of the vessel appear occluded, with perfusion to the distal aspect of the vessel presumably via collaterals. Mild stenosis in the proximal right renal artery. Left renal artery is widely patent. No lymphadenopathy noted in the abdomen or pelvis.  Reproductive: Prostate gland and seminal vesicles are unremarkable in appearance.  Other: Small left inguinal hernia containing only fat. Small volume of ascites, predominantly in the low anatomic pelvis. No pneumoperitoneum.  Musculoskeletal: There are no aggressive appearing lytic or blastic lesions noted in the visualized portions of the skeleton.  VASCULAR MEASUREMENTS PERTINENT TO TAVR:  AORTA:  Minimal Aortic Diameter -  18 x 18 mm  Severity of Aortic Calcification -  moderate  RIGHT PELVIS:  Right Common Iliac Artery -  Minimal Diameter - 7.9 x  7.1 mm  Tortuosity - mild  Calcification - none (graft)  Right External Iliac Artery -  Minimal Diameter - 9.0 x 6.7 mm  Tortuosity - mild  Calcification - mild to moderate  Right Common Femoral Artery -  Minimal Diameter - 7.2 x 5.7 mm  Tortuosity - mild  Calcification - severe  LEFT PELVIS:  Left Common Iliac Artery -  Minimal Diameter - 8.2 x 6.7 mm  Tortuosity - mild  Calcification - none (graft)  Left External Iliac Artery -  Minimal Diameter - 7.8 x 7.1 mm  Tortuosity - mild  Calcification - mild to moderate  Left Common Femoral Artery -  Minimal Diameter - 6.7 x 6.4 mm  Tortuosity - mild  Calcification - severe  Review of the MIP images confirms the above findings.  IMPRESSION: 1. Vascular findings and measurements pertinent to potential TAVR procedure, as detailed above. The patient does appear to have suitable pelvic arterial access, however, the following features should be noted. Both common femoral arteries are densely calcified, at times circumferentially calcified. Patient has undergone prior aorto bi-iliac bypass graft which extends to the external iliac arteries bilaterally. Portions of the proximal native external iliac arteries are patent, and communicate with the remaining pelvic arterial vasculature. At the time of attempted retrograde catheter placement during potential TAVR, this should be noted. 2. Severe thickening calcification of the aortic valve, compatible with the reported clinical history of aortic stenosis. 3. Cardiomegaly with left atrial dilatation, and a filling defect in the tip of the left atrial appendage highly concerning for left atrial appendage thrombus. This  places the patient at risk for systemic embolization. 4. **An incidental finding of potential clinical significance has been found. 4.6 x 5.6 x 3.7 cm masslike lesion in the cecum adjacent to the ileocecal valve may represent a  large polyp or colonic neoplasm. Correlation with nonemergent colonoscopy is strongly recommended in the near future to better evaluate this finding.** 5. 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 6. Small to moderate partially loculated right pleural effusion and trace left pleural effusion lying dependently. 7. Cirrhosis with small volume of ascites. 8. Colonic diverticulosis without evidence to suggest acute diverticulitis at this time. 9. Additional incidental findings, as above.   Electronically Signed   By: Vinnie Langton M.D.   On: 05/24/2016 11:45    Impression:  Patient has stage D severe symptomatic aortic stenosis and single-vessel coronary artery disease. He presents with recent onset of symptoms of exertional shortness of breath and fatigue consistent with chronic diastolic congestive heart failure, [place] Heart Association functional class II. I personally reviewed the patient's recent transthoracic echocardiogram and diagnostic cardiac catheterization.  Transesophageal echocardiogram reveals severe thickening, calcification, and restricted leaflet mobility involving all 3 leaflets of the patient's aortic valve. Peak velocity across the aortic valve very somewhat because of the patient's underlying chronic atrial fibrillation but ranges between 3.8 and 4.2 m/s corresponding to mean transvalvular gradient estimated greater than 45 mmHg.  The DVI was quite low at 0.17 and left ventricular systolic function remains normal.  Diagnostic cardiac catheterization is notable for the presence of severe single vessel coronary artery disease involving high-grade obstruction of mid right coronary artery. This is completely unchanged in comparison with the patient's last diagnostic catheterization performed in 2014, and the patient denies any symptoms of substernal chest pain or chest tightness either with activity or at rest. Options include conventional  surgical aortic valve replacement with coronary artery bypass grafting versus transcatheter aortic valve replacement with continued medical therapy of the patient's underlying coronary artery disease. Risks associated with conventional surgery would be moderately elevated because of the patient's advanced age. CT angiography reveals findings consistent with severe aortic stenosis with anatomical characteristics suitable for transcatheter aortic valve replacement without any significant complicating features.  CT angiography of the abdominal aorta and iliac vessels reveals what appears to be adequate pelvic vascular access to facilitate a transfemoral approach for TAVR, although the patient does have a patent aortobifemoral graft from previous surgical repair of abdominal aortic aneurysm.    Plan:  The patient and his family were counseled at length regarding treatment alternatives for management of severe symptomatic aortic stenosis and coronary artery disease.  The natural history of aortic stenosis and long-term prognosis with medical therapy were discussed.  Alternative approaches such as conventional surgical aortic valve replacement with CABG, transcatheter aortic valve replacement with continued medical therapy for management of his coronary artery disease, and palliative medical therapy were compared and contrasted at length. This discussion was placed in the context of the patient's own specific clinical presentation and past medical history.  All of their questions have been addressed.  The patient is interested in proceeding with transcatheter aortic valve replacement in the near future.  Following the decision to proceed with transcatheter aortic valve replacement, a discussion has been held regarding what types of management strategies would be attempted intraoperatively in the event of life-threatening complications, including whether or not the patient would be considered a candidate for the  use of cardiopulmonary bypass and/or conversion to open sternotomy for  attempted surgical intervention.  The patient specifically requests that he should be considered a candidate for emergency median sternotomy and the development of complications related to tablet that might be salvageable with open surgery. The patient has been advised of a variety of complications that might develop including but not limited to risks of death, stroke, paravalvular leak, aortic dissection or other major vascular complications, aortic annulus rupture, device embolization, cardiac rupture or perforation, mitral regurgitation, acute myocardial infarction, arrhythmia, heart block or bradycardia requiring permanent pacemaker placement, congestive heart failure, respiratory failure, renal failure, pneumonia, infection, other late complications related to structural valve deterioration or migration, or other complications that might ultimately cause a temporary or permanent loss of functional independence or other long term morbidity.  The patient provides full informed consent for the procedure as described and all questions were answered.  We plan to proceed with transcatheter aortic valve replacement via percutaneous transfemoral approach on 06/13/2016. The patient will be referred for a second surgical opinion prior to surgery.  He has been instructed to stop taking warfarin 5 days prior to surgery. He will otherwise remain on all other medications until the morning of surgery.     Valentina Gu. Roxy Manns, MD 05/30/2016 5:15 PM

## 2016-05-31 DIAGNOSIS — D0472 Carcinoma in situ of skin of left lower limb, including hip: Secondary | ICD-10-CM | POA: Diagnosis not present

## 2016-05-31 DIAGNOSIS — Z85828 Personal history of other malignant neoplasm of skin: Secondary | ICD-10-CM | POA: Diagnosis not present

## 2016-05-31 DIAGNOSIS — D225 Melanocytic nevi of trunk: Secondary | ICD-10-CM | POA: Diagnosis not present

## 2016-05-31 DIAGNOSIS — L570 Actinic keratosis: Secondary | ICD-10-CM | POA: Diagnosis not present

## 2016-05-31 DIAGNOSIS — L814 Other melanin hyperpigmentation: Secondary | ICD-10-CM | POA: Diagnosis not present

## 2016-05-31 DIAGNOSIS — D0461 Carcinoma in situ of skin of right upper limb, including shoulder: Secondary | ICD-10-CM | POA: Diagnosis not present

## 2016-05-31 DIAGNOSIS — L821 Other seborrheic keratosis: Secondary | ICD-10-CM | POA: Diagnosis not present

## 2016-06-01 ENCOUNTER — Encounter: Payer: Self-pay | Admitting: Surgery

## 2016-06-01 ENCOUNTER — Institutional Professional Consult (permissible substitution) (INDEPENDENT_AMBULATORY_CARE_PROVIDER_SITE_OTHER): Payer: Medicare Other | Admitting: Surgery

## 2016-06-01 VITALS — BP 163/80 | HR 61 | Resp 16 | Ht 69.0 in | Wt 191.0 lb

## 2016-06-01 DIAGNOSIS — I25118 Atherosclerotic heart disease of native coronary artery with other forms of angina pectoris: Secondary | ICD-10-CM

## 2016-06-01 DIAGNOSIS — I209 Angina pectoris, unspecified: Secondary | ICD-10-CM

## 2016-06-01 DIAGNOSIS — I350 Nonrheumatic aortic (valve) stenosis: Secondary | ICD-10-CM

## 2016-06-01 DIAGNOSIS — I6523 Occlusion and stenosis of bilateral carotid arteries: Secondary | ICD-10-CM

## 2016-06-01 NOTE — Progress Notes (Signed)
Patient ID: [name], male   DOB: Oct 08, 1926, 83 y.o.   MRN: [id number]  Tyler Williams CONSULTATION REPORT  Referring Provider is Tyler Mocha, MD PCP is Tyler Lyons, MD  Chief Complaint  Patient presents with  . Aortic Stenosis    2ND TAVR EVAL...scheduled for 06/13/16    HPI:  The patient is an 83 year old gentleman with a history of hypertension, hyperlipidemia, CAD s/p multiple PCI's and known high grade RCA stenosis that could not be opened, persistent atrial fibrillation on Coumadin, and known aortic stenosis that has been followed with echo. Serial echocardiograms have documented a gradual progression in the severity of his aortic stenosis. He recently developed fairly sudden onset of exertional shortness of breath without chest discomfort. Recently when on a walk at [employer] he experienced a prolonged episode of shortness of breath that developed when he was trying to walk up a hill. He recovered fairly quickly with rest but since then he has noticed decreased energy and intermittent episodes of exertional shortness of breath. He does not get short of breath with ordinary activities around the house and he has never had any resting shortness of breath or chest discomfort. His most recent echo on 05/18/2016 showed a mean gradient of 32 mm Hg and a peak of 59 mm Hg. His DI was 0.2 and the valve area 0.88 cm2. LVEF was normal. Cardiac cath on 05/12/2016 showed a 95% mid RCA stenosis with a heavily calcified vessel that was unchanged from 2014. There were minor non-obstructive stenoses in the LAD and LCX. The mean gradient was 35 mm Hg and the AVA calculated at 1.06 cm2.  He is married and lives with his wife of 29 years who is in fair health. He has been very active until recently due to exertional fatigue and shortness of breath. He has had bilateral knee replacements in the past and has swelling  related to that and due to varicose veins but has continued to walk on a regular basis.   Past Medical History:  Diagnosis Date  . AAA (abdominal aortic aneurysm) ([place])    s/p repair  . Aortic stenosis, mild    a. mean gradient 17 mmHg on 04/2012 TTE  . Arthritis    knees  . CAD (coronary artery disease)    a. s/p multiple percutaneous prior coronary artery interventions b. 05/2012: unsuccessful PCI to tight RCA->medically managed  . Carotid artery disease ([facility])    0-39% bilateral ICA stenoses 11/2011  . Chronic anticoagulation    Coumadin  . GERD (gastroesophageal reflux disease)   . GERD (gastroesophageal reflux disease)   . History of pulmonary embolism    1964 related to dislocated hip on right   . History of skin cancer   . Hypercholesteremia   . Hypertension   . Hypertension   . Incidental cecal mass noted on CT imaging 05/24/2016   **An incidental finding of potential clinical significance has been found. 4.6 x 5.6 x 3.7 cm masslike lesion in the cecum adjacent to the ileocecal valve may represent a large polyp or colonic neoplasm. Correlation with nonemergent colonoscopy is strongly recommended in the near future to better evaluate this finding.**  . Microscopic hematuria    Followed by urology  . Permanent atrial fibrillation ([place])   . PVD (peripheral vascular disease) ([place])    Bilateral renal artery atherosclerosis, SMA stenosis with collateralization  . RBBB    Chronic  .  SSS (sick sinus syndrome) [facility])     Past Surgical History:  Procedure Laterality Date  . ABDOMINAL AORTIC ANEURYSM REPAIR     1993   . CARDIAC CATHETERIZATION  05/24/2012   pD1 20%, oCFX 20%, mCFX 30%, ostial Int Br 30%, distal AV groove CFX 30%, pRCA 30-40%, mRCA 99%, dRCA 30%, PL branch small with diffuse 40%. Unable to pass wire past RCA lesion->medically managed  . CHOLECYSTECTOMY N/A 12/05/2012   Procedure: LAPAROSCOPIC CHOLECYSTECTOMY ;  Surgeon: Edward Jolly, MD;  Location: [place];   Service: General;  Laterality: N/A;  . [place]  . EYE Williams     hx of cataract Williams   . [place]  . HERNIA REPAIR     right inguinal hernia repair   . JOINT REPLACEMENT     left knee replacement, partial right  . LEFT HEART CATHETERIZATION WITH CORONARY ANGIOGRAM N/A 05/24/2012   Procedure: LEFT HEART CATHETERIZATION WITH CORONARY ANGIOGRAM;  Surgeon: Burnell Blanks, MD;  Location: [facility] CATH LAB;  Service: Cardiovascular;  Laterality: N/A;  . ORTHOPEDIC Williams    . OTHER SURGICAL HISTORY     right knee popliteal aneurysm Williams stent placed   . OTHER SURGICAL HISTORY    . PARTIAL KNEE ARTHROPLASTY  01/22/2012   Procedure: UNICOMPARTMENTAL KNEE;  Surgeon: Mauri Pole, MD;  Location: [facility];  Service: Orthopedics;  Laterality: Right;  . PERCUTANEOUS CORONARY INTERVENTION-BALLOON ONLY  05/24/2012   Procedure: PERCUTANEOUS CORONARY INTERVENTION-BALLOON ONLY;  Surgeon: Burnell Blanks, MD;  Location: [facility] CATH LAB;  Service: Cardiovascular;;  . RIGHT/LEFT HEART CATH AND CORONARY ANGIOGRAPHY N/A 05/12/2016   Procedure: Right/Left Heart Cath and Coronary Angiography;  Surgeon: Tyler Mocha, MD;  Location: [place] CV LAB;  Service: Cardiovascular;  Laterality: N/A;  . TONSILLECTOMY      Family History  Problem Relation Age of Onset  . Heart attack Mother 25    Social History   Social History  . Marital status: Married    Spouse name: N/A  . Number of children: N/A  . Years of education: N/A   Occupational History  . Not on file.   Social History Main Topics  . Smoking status: Former Smoker    Types: Cigarettes    Quit date: 02/14/1983  . Smokeless tobacco: Never Used  . Alcohol use 4.2 oz/week    7 Glasses of wine per week     Comment: 5 ounces of wine daily   . Drug use: No  . Sexual activity: Not on file   Other Topics Concern  . Not on file   Social History Narrative  . No narrative on file     Current Outpatient Prescriptions  Medication Sig Dispense Refill  . amLODipine (NORVASC) 5 MG tablet Take 5 mg by mouth daily.    Marland Kitchen atorvastatin (LIPITOR) 20 MG tablet TAKE 1 TABLET ONCE DAILY. 30 tablet 11  . Calcium-Magnesium-Zinc (CAL-MAG-ZINC PO) Take 1 tablet by mouth daily.    . Cholecalciferol (VITAMIN D-3) 5000 UNITS TABS Take 5,000 Units by mouth daily.     . Coenzyme Q10 (CO Q 10) 100 MG CAPS Take 1 capsule by mouth every morning.     . hydrochlorothiazide (MICROZIDE) 12.5 MG capsule TAKE (1) CAPSULE DAILY. 30 capsule 11  . ibuprofen (ADVIL,MOTRIN) 200 MG tablet Take 400 mg by mouth every 6 (six) hours as needed for pain.    . isosorbide mononitrate (IMDUR) 60 MG 24  hr tablet TAKE 1 TABLET EACH DAY. 30 tablet 11  . LORazepam (ATIVAN) 1 MG tablet Take 1 mg by mouth at bedtime.     . metoprolol succinate (TOPROL-XL) 50 MG 24 hr tablet Take 50 mg by mouth daily. Take with or immediately following a meal.     . Multiple Vitamins-Minerals (PRESERVISION AREDS) CAPS Take 1 capsule by mouth daily.    . nitroGLYCERIN (NITROSTAT) 0.4 MG SL tablet Place 1 tablet (0.4 mg total) under the tongue every 5 (five) minutes x 3 doses as needed for chest pain. 30 tablet 4  . omega-3 acid ethyl esters (LOVAZA) 1 G capsule Take 1 g by mouth daily.    . ramipril (ALTACE) 10 MG capsule Take 10 mg by mouth 2 (two) times daily.    . Ranibizumab (LUCENTIS) 0.5 MG/0.05ML SOLN Inject 0.5 mg into the eye See admin instructions. Pt gets this injection every 9 weeks. Next treatment is due on 01-18-12. Pt is followed by [facility].  Gets 1 shot at a time, one week apart.    . vitamin B-12 (CYANOCOBALAMIN) 1000 MCG tablet Take 1,000 mcg by mouth daily.    Marland Kitchen warfarin (COUMADIN) 5 MG tablet Take 5-7.5 mg by mouth See admin instructions. 7.5 mg on Mondays. 5 mg all other days     No current facility-administered medications for this visit.     No Known Allergies    Review of Systems:  General:                       normal appetite, decreased energy, no weight gain, no weight loss, no fever             Cardiac:                       no chest pain with exertion, no chest pain at rest, + SOB with exertion, no resting SOB, no PND, no orthopnea, no palpitations, + arrhythmia, + atrial fibrillation, + LE edema, no dizzy spells, no syncope             Respiratory:                 + exertional shortness of breath, no home oxygen, no productive cough, no dry cough, no bronchitis, no wheezing, no hemoptysis, no asthma, no pain with inspiration or cough, no sleep apnea, no CPAP at night             GI:                               no difficulty swallowing, + reflux, no frequent heartburn, no hiatal hernia, no abdominal pain, + constipation, no diarrhea, no hematochezia, no hematemesis, no melena             GU:                              no dysuria,  no frequency, no urinary tract infection, no hematuria, no enlarged prostate, + kidney stones, no kidney disease             Vascular:                     no pain suggestive of claudication, no pain in feet, no leg cramps, + varicose veins, no DVT, no non-healing  foot ulcer             Neuro:                         no stroke, no TIA's, no seizures, no headaches, no temporary blindness one eye,  no slurred speech, no peripheral neuropathy, no chronic pain, no instability of gait, no memory/cognitive dysfunction             Musculoskeletal:         mild arthritis, no joint swelling, no myalgias, no difficulty walking, normal mobility              Skin:                            no rash, no itching, no skin infections, no pressure sores or ulcerations             Psych:                         no anxiety, no depression, no nervousness, no unusual recent stress             Eyes:                           no blurry vision, no floaters, no recent vision changes, + wears glasses or contacts             ENT:                            no hearing loss, no loose or  painful teeth, no dentures, last saw dentist January 2018             Hematologic:               + easy bruising, no abnormal bleeding, no clotting disorder, no frequent epistaxis             Endocrine:                   no diabetes, does not check CBG's at home                                 Physical Exam:   BP (!) 163/80 (BP Location: Left Arm, Patient Position: Sitting, Cuff Size: Large)   Pulse 61   Resp 16   Ht 5\' 9"  (1.753 m)   Wt 191 lb (86.6 kg)   SpO2 95% Comment: ON RA  BMI 28.21 kg/m   General:  Elderly but  well-appearing  HEENT:  Unremarkable, NCAT, PERLA, EOMI, oropharynx clear. Teeth in fair condition.  Neck:   no JVD, no bruits, no adenopathy or thyromegaly  Chest:   clear to auscultation, symmetrical breath sounds, no wheezes, no rhonchi   CV:   RRR, grade III/VI crescendo/decrescendo murmur heard best at RSB,  no diastolic murmur  Abdomen:  soft, non-tender, no masses or organomegaly  Extremities:  warm, well-perfused, pulses palpable in feet, no LE edema  Rectal/GU  Deferred  Neuro:   Grossly non-focal and symmetrical throughout  Skin:   Clean and dry, no rashes, no breakdown   Diagnostic Tests:         [facility] [address]  [address] [place], [place] [address]                            [phone]  ------------------------------------------------------------------- Transthoracic Echocardiography  Patient:    Tyler Williams, Tyler Williams MR #:       [id number] Study Date: 05/18/2016 Gender:     M Age:        52 Height:     175.3 cm Weight:     84.8 kg BSA:        2.05 m^2 Pt. Status: Room:   [place], [place]    Tyler Mocha, MD  ORDERING     Tyler Mocha, MD  [place], MD  PERFORMING   [facility], Outpatient  SONOGRAPHER  [facility], RDCS  cc:  ------------------------------------------------------------------- LV EF: 55% -    60%  ------------------------------------------------------------------- Indications:      Aortic Stenosis (I35.0).  ------------------------------------------------------------------- History:   PMH:   Atrial fibrillation.  Coronary artery disease. Risk factors:  Sick Sinus Syndrome, Pulmonary Embolism, Peripheral Vascular Disease, Right Bundle Branch Block, Abdominal Aortic Aneurysm s/p Repair  Family history of coronary artery disease. Former tobacco use. Hypertension. Dyslipidemia.  ------------------------------------------------------------------- Study Conclusions  - Left ventricle: The cavity size was normal. Wall thickness was   normal. Systolic function was normal. The estimated ejection   fraction was in the range of 55% to 60%. Wall motion was normal;   there were no regional wall motion abnormalities. - Aortic valve: There was severe stenosis. Valve area (VTI): 0.88   cm^2. Valve area (Vmax): 0.88 cm^2. Valve area (Vmean): 0.77   cm^2. - Mitral valve: Calcified annulus. Mildly thickened leaflets .   There was mild regurgitation. Valve area by pressure half-time:   1.93 cm^2. Valve area by continuity equation (using LVOT flow):   3.39 cm^2. - Left atrium: The atrium was severely dilated. - Right atrium: The atrium was moderately to severely dilated. - Pulmonary arteries: Systolic pressure was moderately increased.   PA peak pressure: 60 mm Hg (S).  ------------------------------------------------------------------- Study data:  Comparison was made to the study of 09/30/2015.  Study status:  Routine.  Procedure:  Transthoracic echocardiography. Image quality was adequate.          Transthoracic echocardiography.  M-mode, complete 2D, spectral Doppler, and color Doppler.  Birthdate:  Patient birthdate: 02/13/1927.  Age:  Patient is 83 yr old.  Sex:  Gender: male.    BMI: 27.6 kg/m^2.  Blood pressure:     146/76  Patient status:  Outpatient.  Study date: Study  date: 05/18/2016. Study time: 08:57 AM.  Location:  [place] [facility]  -------------------------------------------------------------------  ------------------------------------------------------------------- Left ventricle:  The cavity size was normal. Wall thickness was normal. Systolic function was normal. The estimated ejection fraction was in the range of 55% to 60%. Wall motion was normal; there were no regional wall motion abnormalities. The study was not technically sufficient to allow evaluation of LV diastolic dysfunction due to atrial fibrillation.  ------------------------------------------------------------------- Aortic valve:   Probably trileaflet; mildly thickened, mildly calcified leaflets.  Doppler:   There was severe stenosis.   The jet is still early peaking.    VTI ratio of LVOT to aortic valve: 0.2. Valve area (VTI): 0.88 cm^2. Indexed valve area (VTI): 0.43 cm^2/m^2.  Peak velocity ratio of LVOT to aortic valve: 0.2. Valve area (Vmax): 0.88 cm^2. Indexed valve area (Vmax): 0.43 cm^2/m^2. Mean velocity ratio of LVOT to aortic valve: 0.17. Valve area (Vmean): 0.77 cm^2. Indexed valve area (Vmean): 0.38 cm^2/m^2. Mean gradient (S): 32 mm Hg. Peak gradient (S): 59 mm Hg.  ------------------------------------------------------------------- Aorta:  Aortic root: The aortic root was normal in size. Ascending aorta: The ascending aorta was normal in size.  ------------------------------------------------------------------- Mitral valve:   Calcified annulus. Mildly thickened leaflets . Doppler:  There was mild regurgitation.    Valve area by pressure half-time: 1.93 cm^2. Indexed valve area by pressure half-time: 0.94 cm^2/m^2. Valve area by continuity equation (using LVOT flow): 3.39 cm^2. Indexed valve area by continuity equation (using LVOT flow): 1.65 cm^2/m^2.    Mean gradient (D): 2 mm Hg. Peak gradient (D): 6 mm  Hg.  ------------------------------------------------------------------- Left atrium:  The atrium was severely dilated.  ------------------------------------------------------------------- Right ventricle:  The cavity size was normal. Systolic function was normal.  ------------------------------------------------------------------- Pulmonic valve:   Poorly visualized.  The valve appears to be grossly normal.    Doppler:  There was mild regurgitation.  ------------------------------------------------------------------- Tricuspid valve:   Structurally normal valve.   Leaflet separation was normal.  Doppler:  Transvalvular velocity was within the normal range. There was mild regurgitation.  ------------------------------------------------------------------- Pulmonary artery:   Systolic pressure was moderately increased.  ------------------------------------------------------------------- Right atrium:  The atrium was moderately to severely dilated.  ------------------------------------------------------------------- Pericardium:  There was no pericardial effusion.  ------------------------------------------------------------------- Systemic veins: Inferior vena cava: The vessel was dilated. The respirophasic diameter changes were blunted (< 50%), consistent with elevated central venous pressure.  ------------------------------------------------------------------- Measurements   Left ventricle                           Value          Reference  LV ID, ED, PLAX chordal                  43.4  mm       43 - 52  LV ID, ES, PLAX chordal                  24.2  mm       23 - 38  LV fx shortening, PLAX chordal           44    %        >=29  LV PW thickness, ED                      10.8  mm       ----------  IVS/LV PW ratio, ED                      1.14           <=1.3  Stroke volume, 2D                        118   ml       ----------  Stroke volume/bsa, 2D                     58    ml/m^2   ----------  LV e&', lateral                           7.18  cm/s     ----------  LV E/e&', lateral                         17.41          ----------  LV e&', medial                            6.64  cm/s     ----------  LV E/e&', medial                          18.83          ----------  LV e&', average                           6.91  cm/s     ----------  LV E/e&', average                         18.09          ----------    Ventricular septum                       Value          Reference  IVS thickness, ED                        12.3  mm       ----------    LVOT                                     Value          Reference  LVOT ID, S                               23.7  mm       ----------  LVOT area                                4.41  cm^2     ----------  LVOT peak velocity, S                    76.15 cm/s     ----------  LVOT mean velocity, S                    45.3  cm/s     ----------  LVOT VTI, S                              20.76 cm       ----------  Stroke volume (SV), LVOT DP              91.6  ml       ----------  Stroke index (SV/bsa), LVOT DP           44.7  ml/m^2   ----------    Aortic valve  Value          Reference  Aortic valve peak velocity, S            383   cm/s     ----------  Aortic valve mean velocity, S            260   cm/s     ----------  Aortic valve VTI, S                      105   cm       ----------  Aortic mean gradient, S                  32    mm Hg    ----------  Aortic peak gradient, S                  59    mm Hg    ----------  VTI ratio, LVOT/AV                       0.2            ----------  Aortic valve area, VTI                   0.88  cm^2     ----------  Aortic valve area/bsa, VTI               0.43  cm^2/m^2 ----------  Velocity ratio, peak, LVOT/AV            0.2            ----------  Aortic valve area, peak velocity         0.88  cm^2     ----------  Aortic valve area/bsa, peak              0.43   cm^2/m^2 ----------  velocity  Velocity ratio, mean, LVOT/AV            0.17           ----------  Aortic valve area, mean velocity         0.77  cm^2     ----------  Aortic valve area/bsa, mean              0.38  cm^2/m^2 ----------  velocity    Aorta                                    Value          Reference  Aortic root ID, ED                       31    mm       ----------  Ascending aorta ID, A-P, S               35    mm       ----------    Left atrium                              Value          Reference  LA ID, A-P, ES  54    mm       ----------  LA ID/bsa, A-P                   (H)     2.64  cm/m^2   <=2.2  LA volume, S                             91.5  ml       ----------  LA volume/bsa, S                         44.7  ml/m^2   ----------  LA volume, ES, 1-p A4C                   97.8  ml       ----------  LA volume/bsa, ES, 1-p A4C               47.7  ml/m^2   ----------  LA volume, ES, 1-p A2C                   85.6  ml       ----------  LA volume/bsa, ES, 1-p A2C               41.8  ml/m^2   ----------    Mitral valve                             Value          Reference  Mitral E-wave peak velocity              125   cm/s     ----------  Mitral mean velocity, D                  56.7  cm/s     ----------  Mitral deceleration time                 173   ms       150 - 230  Mitral pressure half-time                114   ms       ----------  Mitral mean gradient, D                  2     mm Hg    ----------  Mitral peak gradient, D                  6     mm Hg    ----------  Mitral valve area, PHT, DP               1.93  cm^2     ----------  Mitral valve area/bsa, PHT, DP           0.94  cm^2/m^2 ----------  Mitral valve area, LVOT                  3.39  cm^2     ----------  continuity  Mitral valve area/bsa, LVOT              1.65  cm^2/m^2 ----------  continuity  Mitral annulus VTI, D                    34.7  cm       ----------  Mitral regurg  VTI, PISA                  232   cm       ----------    Pulmonary arteries                       Value          Reference  PA pressure, S, DP               (H)     60    mm Hg    <=30    Tricuspid valve                          Value          Reference  Tricuspid regurg peak velocity           337   cm/s     ----------  Tricuspid peak RV-RA gradient            45    mm Hg    ----------    Right atrium                             Value          Reference  RA ID, S-I, ES, A4C              (H)     66    mm       34 - 49  RA area, ES, A4C                 (H)     26.7  cm^2     8.3 - 19.5  RA volume, ES, A/L                       88    ml       ----------  RA volume/bsa, ES, A/L                   42.9  ml/m^2   ----------    Systemic veins                           Value          Reference  Estimated CVP                            15    mm Hg    ----------    Right ventricle                          Value          Reference  TAPSE                                    16    mm       ----------  RV pressure, S, DP               (H)     60    mm Hg    <=30  RV s&',  lateral, S                        10.8  cm/s     ----------  Legend: (L)  and  (H)  mark values outside specified reference range.  ------------------------------------------------------------------- Prepared and Electronically Authenticated by  Sanda Klein, MD 2018-04-05T14:47:49   [facility]  Cardiac catheterization  Order# [id number]  Reading physician: Tyler Mocha, MD Ordering physician: Tyler Mocha, MD Study date: 05/12/16  Physicians   Panel Physicians Referring Physician Case Authorizing Physician  Tyler Mocha, MD (Primary)    Procedures   Right/Left Heart Cath and Coronary Angiography  Conclusion   1. Severe RCA stenosis with heavy calcification unchanged from 2014 study 2. Minor nonobstructive LAD and LCx stenosis 3. Severely calcified aortic valve with severe aortic stenosis (mean gradient  35 mmHg and AVA 1.06 square cm)  Recommend: Further evaluation by the multidisciplinary heart team for consideration of TAVR/PCI or other treatment options. Will obtain updated echo and CTA studies.  Indications   Severe aortic stenosis [I35.0 (ICD-10-CM)]  Procedural Details/Technique   Technical Details INDICATION: Stage D, symptomatic aortic stenosis. 83 yo male with known CAD, failed attempt at RCA PCI in 2014, has developed progressive dyspnea and found to have moderate/severe aortic stenosis. Presents for R/L heart catheterization for further evaluation.  PROCEDURAL DETAILS: There was an indwelling IV in a right antecubital vein. Using normal sterile technique, the IV was changed out for a 5 Fr brachial sheath over a 0.018 inch wire. The right wrist was then prepped, draped, and anesthetized with 1% lidocaine. I was able to pass a wire into the vein but never could advance a catheter up the arm. Attention was turned to the left groin. A front wall venous puncture was performed and a 7 Fr sheath was advanced. A swan-ganz catheter was used for the right heart catheterization.  Using the modified Seldinger technique a 5/6 French Slender sheath was placed in the left radial artery. Intra-arterial verapamil was administered through the radial artery sheath. IV heparin was administered after a JR4 catheter was advanced into the central aorta. Standard Judkins catheters were used for selective coronary angiography and aortic angiography. An AL-1 catheter and straight wire were used to cross the aortic valve. There were no immediate procedural complications. The patient was transferred to the post catheterization recovery area for further monitoring.    Estimated blood loss <50 mL.  During this procedure the patient was administered the following to achieve and maintain moderate conscious sedation: Versed 2 mg, Fentanyl 25 mcg, while the patient's heart rate, blood pressure, and oxygen saturation  were continuously monitored.    Coronary Findings   Dominance: Right  Left Anterior Descending  There is mild the vessel.  Mid LAD to Dist LAD lesion, 25% stenosed.  Ramus Intermedius  The vessel exhibits minimal luminal irregularities.  Left Circumflex  There is mild the vessel.  Mid Cx lesion, 30% stenosed.  Right Coronary Artery  Prox RCA to Mid RCA lesion, 40% stenosed. The lesion is severely calcified.  Mid RCA lesion, 95% stenosed. The lesion is severely calcified.  Dist RCA lesion, 40% stenosed. The lesion is moderately calcified.  Right Heart   Right Heart Pressures Hemodynamic findings consistent with aortic valve stenosis. Aortic Valve: mean gradient 35 mmHg and calculated aortic valve area 1.06 square cm    Coronary Diagrams   Diagnostic Diagram       Implants     No implant  documentation for this case.  PACS Images   Show images for Cardiac catheterization   Link to Procedure Log   Procedure Log    Hemo Data    Most Recent Value  Fick Cardiac Output 5.81 L/min  Fick Cardiac Output Index 2.89 (L/min)/BSA  Aortic Mean Gradient 34.7 mmHg  Aortic Peak Gradient 34 mmHg  Aortic Valve Area 1.06  Aortic Value Area Index 0.53 cm2/BSA  RA A Wave -99 mmHg  RA V Wave 13 mmHg  RA Mean 12 mmHg  RV Systolic Pressure 51 mmHg  RV Diastolic Pressure 10 mmHg  RV EDP 14 mmHg  PA Systolic Pressure 53 mmHg  PA Diastolic Pressure 24 mmHg  PA Mean 34 mmHg  PW A Wave -99 mmHg  PW V Wave 32 mmHg  PW Mean 22 mmHg  AO Systolic Pressure 829 mmHg  AO Diastolic Pressure 63 mmHg  AO Mean 91 mmHg  LV Systolic Pressure 937 mmHg  LV Diastolic Pressure 12 mmHg  LV EDP 17 mmHg  Arterial Occlusion Pressure Extended Systolic Pressure 169 mmHg  Arterial Occlusion Pressure Extended Diastolic Pressure 55 mmHg  Arterial Occlusion Pressure Extended Mean Pressure 88 mmHg  Left Ventricular Apex Extended Systolic Pressure 678 mmHg  Left Ventricular Apex Extended Diastolic Pressure  15 mmHg  Left Ventricular Apex Extended EDP Pressure 19 mmHg  QP/QS 1  TPVR Index 11.75 HRUI  TSVR Index 31.46 HRUI  PVR SVR Ratio 0.15  TPVR/TSVR Ratio 0.37    CT CORONARY MORPH W/CTA COR W/SCORE W/CA W/CM &/OR WO/CM (Accession [id number]) (Order [id number])  Imaging  Date: 05/23/2016 Department: [facility] CT IMAGING Released By: Marijo Sanes Authorizing: Tyler Mocha, MD  Exam Information   Status Exam Begun  Exam Ended   Final [99] 05/23/2016 3:54 PM 05/23/2016 4:27 PM  PACS Images   Show images for CT CORONARY MORPH W/CTA COR W/SCORE W/CA W/CM &/OR WO/CM  Addendum   ADDENDUM REPORT: 05/24/2016 08:53  CLINICAL DATA:  83 year old male with severe aortic stenosis.  EXAM: Cardiac TAVR CT  TECHNIQUE: The patient was scanned on a Philips 256 scanner. A 120 kV retrospective scan was triggered in the descending thoracic aorta at 111 HU's. Gantry rotation speed was 270 msecs and collimation was .9 mm. No beta blockade or nitro were given. The 3D data set was reconstructed in 5% intervals of the R-R cycle. Systolic and diastolic phases were analyzed on a dedicated work station using MPR, MIP and VRT modes. The patient received 80 cc of contrast.  FINDINGS: Aortic Valve: Trileaflet, severely thickened and symmetrically severely calcified aortic valve with severely restricted leaflet opening. There are minimal calcifications extending into the LVOT.  Aorta: Normal size, moderate diffuse calcifications with severe atherosclerosis. No dissection.  Sinotubular Junction:  30 x 27 mm  Ascending Thoracic Aorta:  32 x 32 mm  Aortic Arch:  29 x 25 mm  Descending Thoracic Aorta:  28 x 26 mm  Sinus of Valsalva Measurements:  Non-coronary:  33 mm  Right -coronary:  34 mm  Left -coronary:  36 mm  Coronary Artery Height above Annulus:  Left Main:  15 mm  Right Coronary:  20 mm  Virtual Basal Annulus  Measurements:  Maximum/Minimum Diameter:  31 x 26 mm  Perimeter:  106 mm  Area:  622 mm2  Optimum Fluoroscopic Angle for Delivery:  LAO5 CAU 5  IMPRESSION: 1. Trileaflet, severely thickened and symmetrically severely calcified aortic valve with severely restricted leaflet opening. There are minimal calcifications extending  into the LVOT. Annular measurements suitable for delivery of a 29 mm Edwards-SAPIEN 3 TAVR valve.  2.  Sufficient annulus to coronary distance.  3.  Optimum Fluoroscopic Angle for Delivery:  LAO5 CAU 5.  4. There is a filling defect in the tip of a large left atrial appendage. This might represent lack of contrast or a thrombus. A TEE prior to the procedure is recommended.  Tyler Williams   Electronically Signed   By: Tyler Williams   On: 05/24/2016 08:53   Addended by Dorothy Spark, MD on 05/24/2016 8:56 AM    Study Result   EXAM: OVER-READ INTERPRETATION  CT CHEST  The following report is an over-read performed by radiologist Dr. Rebekah Chesterfield [facility] on 05/24/2016. This over-read does not include interpretation of cardiac or coronary anatomy or pathology. The coronary CTA interpretation by the cardiologist is attached.  COMPARISON:  Chest CT 12/03/2012.  FINDINGS: Extracardiac findings will be described separately under contemporaneously obtained CTA of the chest, abdomen and pelvis 05/23/2016.  IMPRESSION: Please see separate dictation for contemporaneously obtained CTA of the chest, abdomen and pelvis 05/23/2016 for full description of extracardiac findings.  Electronically Signed: By: Vinnie Langton M.D. On: 05/24/2016 08:11       CT Angio Abd/Pel w/ and/or w/o (Accession [id number]) (Order [id number])  Imaging  Date: 05/23/2016 Department: [facility] CT IMAGING Released By: Danielle Dess Authorizing: Tyler Mocha, MD  Exam Information   Status Exam Begun   Exam Ended   Final [99] 05/23/2016 3:55 PM 05/23/2016 4:30 PM  PACS Images   Show images for CT Angio Abd/Pel w/ and/or w/o  Study Result   CLINICAL DATA:  83 year old male with history of severe aortic stenosis. Preprocedural study prior to potential transcatheter aortic valve replacement (TAVR) procedure.  EXAM: CT ANGIOGRAPHY CHEST, ABDOMEN AND PELVIS  TECHNIQUE: Multidetector CT imaging through the chest, abdomen and pelvis was performed using the standard protocol during bolus administration of intravenous contrast. Multiplanar reconstructed images and MIPs were obtained and reviewed to evaluate the vascular anatomy.  CONTRAST:  75 mL of Isovue 370.  COMPARISON:  CT the abdomen and pelvis 09/27/2013. Chest CT 12/03/2012.  FINDINGS: CTA CHEST FINDINGS  Cardiovascular: Heart size is mildly enlarged with left atrial dilatation. There is no significant pericardial fluid, thickening or pericardial calcification. Large filling defect in the tip of the left atrial appendage, highly concerning for left atrial appendage thrombus. There is aortic atherosclerosis, as well as atherosclerosis of the great vessels of the mediastinum and the coronary arteries, including calcified atherosclerotic plaque in the left main, left anterior descending, left circumflex and right coronary arteries. Severe thickening calcification of the aortic valve.  Mediastinum/Lymph Nodes: No pathologically enlarged mediastinal or hilar lymph nodes. Esophagus is unremarkable in appearance. No axillary lymphadenopathy.  Lungs/Pleura: Small to moderate right pleural effusion which appears likely partially loculated. Trace left pleural effusion lying dependently. Areas of scarring and/or subsegmental atelectasis are noted in the right lower lobe. Thin-walled cyst in the periphery of the right lower lobe again noted. No suspicious appearing pulmonary nodules or masses are noted. No acute  consolidative airspace disease.  Musculoskeletal/Soft Tissues: There are no aggressive appearing lytic or blastic lesions noted in the visualized portions of the skeleton.  CTA ABDOMEN AND PELVIS FINDINGS  Hepatobiliary: Liver has a shrunken appearance and nodular contour, indicative of underlying cirrhosis. 1.3 cm low-attenuation lesion in the inferior aspect of segment 5 is compatible with a simple cyst. Subcentimeter low-attenuation  lesion in segment 4A is too small to characterize, but likely a tiny cyst. No other suspicious appearing hepatic lesions are noted. No intra or extrahepatic biliary ductal dilatation. Status post cholecystectomy.  Pancreas: No pancreatic mass. No pancreatic ductal dilatation. No pancreatic or peripancreatic fluid or inflammatory changes.  Spleen: Unremarkable.  Adrenals/Urinary Tract: There are multiple low-attenuation lesions in both kidneys, compatible with simple cysts, measuring up to 4.1 cm in diameter in the anterior aspect of the interpolar region of the left kidney. Other subcentimeter low-attenuation lesions in both kidneys are too small to definitively characterize, but are also favored to represent small cysts. Bilateral adrenal glands are normal in appearance. No hydroureteronephrosis. Urinary bladder is unremarkable in appearance.  Stomach/Bowel: The appearance of the stomach is normal. There is no pathologic dilatation of small bowel or colon. In the cecum adjacent to the ileocecal valve there is a 4.6 x 5.6 x 3.7 cm mass-like lesion (axial image 197 of series 401 and coronal image 26 of series 403), which could represent a large polyp or malignant lesion. Several colonic diverticulae are noted, particularly in the descending colon and sigmoid colon, without surrounding inflammatory changes to suggest an acute diverticulitis at this time. Normal appendix.  Vascular/Lymphatic: Vascular findings and measurements pertinent  to potential TAVR procedure, as detailed below. Notably, the patient has undergone aorto bi-iliac bypass Williams with an aorto bi-iliac bypass graft which extends from the infrarenal abdominal aorta to the external iliac arteries bilaterally. The graft is widely patent. There are portions of the native external iliac artery use which remain patent and communicate with the remaining pelvic vasculature, which should be noted prior to retrograde placement of catheters at time of attempted TAVR. 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 Celiac axis is widely patent. Inferior mesenteric artery is patent, although the origin and proximal aspect of the vessel appear occluded, with perfusion to the distal aspect of the vessel presumably via collaterals. Mild stenosis in the proximal right renal artery. Left renal artery is widely patent. No lymphadenopathy noted in the abdomen or pelvis.  Reproductive: Prostate gland and seminal vesicles are unremarkable in appearance.  Other: Small left inguinal hernia containing only fat. Small volume of ascites, predominantly in the low anatomic pelvis. No pneumoperitoneum.  Musculoskeletal: There are no aggressive appearing lytic or blastic lesions noted in the visualized portions of the skeleton.  VASCULAR MEASUREMENTS PERTINENT TO TAVR:  AORTA:  Minimal Aortic Diameter -  18 x 18 mm  Severity of Aortic Calcification -  moderate  RIGHT PELVIS:  Right Common Iliac Artery -  Minimal Diameter - 7.9 x 7.1 mm  Tortuosity - mild  Calcification - none (graft)  Right External Iliac Artery -  Minimal Diameter - 9.0 x 6.7 mm  Tortuosity - mild  Calcification - mild to moderate  Right Common Femoral Artery -  Minimal Diameter - 7.2 x 5.7 mm  Tortuosity - mild  Calcification - severe  LEFT PELVIS:  Left Common Iliac Artery -  Minimal Diameter - 8.2 x 6.7 mm  Tortuosity -  mild  Calcification - none (graft)  Left External Iliac Artery -  Minimal Diameter - 7.8 x 7.1 mm  Tortuosity - mild  Calcification - mild to moderate  Left Common Femoral Artery -  Minimal Diameter - 6.7 x 6.4 mm  Tortuosity - mild  Calcification - severe  Review of the MIP images confirms the above findings.  IMPRESSION: 1. Vascular findings and measurements pertinent to potential TAVR  procedure, as detailed above. The patient does appear to have suitable pelvic arterial access, however, the following features should be noted. Both common femoral arteries are densely calcified, at times circumferentially calcified. Patient has undergone prior aorto bi-iliac bypass graft which extends to the external iliac arteries bilaterally. Portions of the proximal native external iliac arteries are patent, and communicate with the remaining pelvic arterial vasculature. At the time of attempted retrograde catheter placement during potential TAVR, this should be noted. 2. Severe thickening calcification of the aortic valve, compatible with the reported clinical history of aortic stenosis. 3. Cardiomegaly with left atrial dilatation, and a filling defect in the tip of the left atrial appendage highly concerning for left atrial appendage thrombus. This places the patient at risk for systemic embolization. 4. **An incidental finding of potential clinical significance has been found. 4.6 x 5.6 x 3.7 cm masslike lesion in the cecum adjacent to the ileocecal valve may represent a large polyp or colonic neoplasm. Correlation with nonemergent colonoscopy is strongly recommended in the near future to better evaluate this finding.** 5. 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 6. Small to moderate partially loculated right pleural effusion and trace left pleural effusion lying dependently. 7. Cirrhosis with small volume of ascites. 8. Colonic  diverticulosis without evidence to suggest acute diverticulitis at this time. 9. Additional incidental findings, as above.   Electronically Signed   By: Vinnie Langton M.D.   On: 05/24/2016 11:45    STS Risk Calculator  Procedure                                          AVR + CABG  Risk of Mortality                                2.98% Morbidity or Mortality                       17.8% Prolonged LOS                                   9.4% Short LOS                                           24.1% Permanent Stroke                             1.7% Prolonged Vent Support                     10.1% DSW Infection                                     0.3% Renal Failure                                       5.0% Reoperation  8.3%  Impression:  This 83 year old gentleman has stage D severe, symptomatic aortic stenosis and high grade single vessel coronary artery disease with recent onset of NYHA class II symptoms of exertional shortness of breath and fatigue consistent with chronic diastolic heart failure. I have personally reviewed his echo, cath and CT images. He probably has a trileaflet aortic valve with mildly calcified leaflets with restricted mobility. His mean gradient was measured at 32 mm Hg but the peak velocity varies due to his atrial fibrillation. His DI is 0.2 consistent with severe AS. His cath shows a stable high grade mid RCA stenosis that is calcified and could not be opened previously. He has no other significant disease and no angina so I think this can be left alone. I agree that AVR is indicated for this patient but his risk for open surgical AVR and CABG is moderately elevated due to his age. I think TAVR would be a reasonable alternative.  His gated cardiac CT shows anatomy favorable for TAVR using a Sapien 3 valve. There is a small filling defect in the tip of the LAA that could be a small thrombus or just lack on contrast but he  has been on Coumadin so I think this does not really alter the decision making and will be further evaluated in the OR with TEE. His abdominal and pelvic CT shows adequate pelvic vessels for transfemoral access although both common femoral arteries are concentrically calcified distally and he still has patent proximal external iliac arteries communicating with the deep pelvic vessels s/p aorto-bi-iliac bypass for AAA.   The patient was counseled at length regarding treatment alternatives for management of severe symptomatic aortic stenosis. The risks and benefits of surgical intervention has been discussed in detail. Long-term prognosis with medical therapy was discussed. Alternative approaches such as conventional surgical aortic valve replacement, transcatheter aortic valve replacement, and palliative medical therapy were compared and contrasted at length. This discussion was placed in the context of the patient's own specific clinical presentation and past medical history. All of their questions been addressed. The patient is eager to proceed with surgical management as soon as possible.   Following the decision to proceed with transcatheter aortic valve replacement, a discussion was held regarding what types of management strategies would be attempted intraoperatively in the event of life-threatening complications, including whether or not the patient would be considered a candidate for the use of cardiopulmonary bypass and/or conversion to open sternotomy for attempted surgical intervention. The patient is aware of the fact that transient use of cardiopulmonary bypass may be necessary. He would like to have open Williams performed if possible if there is any difficulty performing TAVR or if complications arise that require sternotomy to repair.   The patient has been advised of a variety of complications that might develop including but not limited to risks of death, stroke, paravalvular leak, aortic  dissection or other major vascular complications, aortic annulus rupture, device embolization, cardiac rupture or perforation, mitral regurgitation, acute myocardial infarction, arrhythmia, heart block or bradycardia requiring permanent pacemaker placement, congestive heart failure, respiratory failure, renal failure, pneumonia, infection, other late complications related to structural valve deterioration or migration, or other complications that might ultimately cause a temporary or permanent loss of functional independence or other long term morbidity. The patient provides full informed consent for the procedure as described and all questions were answered.     Plan:  He is scheduled for transfemoral TAVR on 06/13/2016. He is going to stop  his Coumadin 5 days prior to Williams.    I spent 45 minutes performing this consultation and > 50% of this time was spent face to face counseling and coordinating the care of this patient's severe aortic stenosis.   Gaye Pollack, MD 06/01/2016 3:18 PM

## 2016-06-02 ENCOUNTER — Encounter: Payer: Medicare Other | Admitting: Surgery

## 2016-06-06 ENCOUNTER — Encounter: Payer: Medicare Other | Admitting: Surgery

## 2016-06-08 ENCOUNTER — Encounter: Payer: Medicare Other | Admitting: Surgery

## 2016-06-08 NOTE — Pre-Procedure Instructions (Signed)
Tyler Williams  06/08/2016      [place], [place] [address] Phone: [phone] Fax: [phone]    Your procedure is scheduled on Tuesday May 1.  Report to [facility] Admitting at 11:00 A.M.  Call this number if you have problems the morning of surgery:  [phone]   Remember:  Do not eat food or drink liquids after midnight.  Take these medicines the morning of surgery with A SIP OF WATER: amlodipine (norvasc), isosorbide (imdur)   7 days prior to surgery STOP taking any Aspirin, Aleve, Naproxen, Ibuprofen, Motrin, Advil, Goody's, BC's, all herbal medications, fish oil, and all vitamins  FOLLOW MD instructions on stopping COUMADIN (warfarin) (Stop taking 5 days prior to surgery)   Do not wear jewelry, make-up or nail polish.  Do not wear lotions, powders, or perfumes, or deoderant.  Do not shave 48 hours prior to surgery.  Men may shave face and neck.  Do not bring valuables to the hospital.  [facility] is not responsible for any belongings or valuables.  Contacts, dentures or bridgework may not be worn into surgery.  Leave your suitcase in the car.  After surgery it may be brought to your room.  For patients admitted to the hospital, discharge time will be determined by your treatment team.  Patients discharged the day of surgery will not be allowed to drive home.   Special instructions:    [facility]- Preparing For Surgery  Before surgery, you can play an important role. Because skin is not sterile, your skin needs to be as free of germs as possible. You can reduce the number of germs on your skin by washing with CHG (chlorahexidine gluconate) Soap before surgery.  CHG is an antiseptic cleaner which kills germs and bonds with the skin to continue killing germs even after washing.  Please do not use if you have an allergy to CHG or antibacterial soaps. If your skin  becomes reddened/irritated stop using the CHG.  Do not shave (including legs and underarms) for at least 48 hours prior to first CHG shower. It is OK to shave your face.  Please follow these instructions carefully.   1. Shower the NIGHT BEFORE SURGERY and the MORNING OF SURGERY with CHG.   2. If you chose to wash your hair, wash your hair first as usual with your normal shampoo.  3. After you shampoo, rinse your hair and body thoroughly to remove the shampoo.  4. Use CHG as you would any other liquid soap. You can apply CHG directly to the skin and wash gently with a scrungie or a clean washcloth.   5. Apply the CHG Soap to your body ONLY FROM THE NECK DOWN.  Do not use on open wounds or open sores. Avoid contact with your eyes, ears, mouth and genitals (private parts). Wash genitals (private parts) with your normal soap.  6. Wash thoroughly, paying special attention to the area where your surgery will be performed.  7. Thoroughly rinse your body with warm water from the neck down.  8. DO NOT shower/wash with your normal soap after using and rinsing off the CHG Soap.  9. Pat yourself dry with a CLEAN TOWEL.   10. Wear CLEAN PAJAMAS   11. Place CLEAN SHEETS on your bed the night of your first shower and DO NOT SLEEP WITH PETS.    Day of Surgery: Do  not apply any deodorants/lotions. Please wear clean clothes to the hospital/surgery center.      Please read over the following fact sheets that you were given. Coughing and Deep Breathing and MRSA Information

## 2016-06-09 ENCOUNTER — Encounter (HOSPITAL_COMMUNITY): Payer: Self-pay

## 2016-06-09 ENCOUNTER — Encounter (HOSPITAL_COMMUNITY)
Admission: RE | Admit: 2016-06-09 | Discharge: 2016-06-09 | Disposition: A | Discharge status: Home / Self Care | Payer: Medicare Other | Source: Ambulatory Visit | Attending: Cardiovascular Disease | Admitting: Cardiovascular Disease

## 2016-06-09 ENCOUNTER — Ambulatory Visit (HOSPITAL_COMMUNITY)
Admission: RE | Admit: 2016-06-09 | Discharge: 2016-06-09 | Disposition: A | Discharge status: Home / Self Care | Payer: Medicare Other | Source: Ambulatory Visit | Attending: Cardiovascular Disease | Admitting: Cardiovascular Disease

## 2016-06-09 DIAGNOSIS — D696 Thrombocytopenia, unspecified: Secondary | ICD-10-CM | POA: Diagnosis not present

## 2016-06-09 DIAGNOSIS — J9811 Atelectasis: Secondary | ICD-10-CM | POA: Diagnosis not present

## 2016-06-09 DIAGNOSIS — Z01812 Encounter for preprocedural laboratory examination: Secondary | ICD-10-CM | POA: Diagnosis not present

## 2016-06-09 DIAGNOSIS — Z9049 Acquired absence of other specified parts of digestive tract: Secondary | ICD-10-CM | POA: Diagnosis not present

## 2016-06-09 DIAGNOSIS — Z01810 Encounter for preprocedural cardiovascular examination: Secondary | ICD-10-CM | POA: Insufficient documentation

## 2016-06-09 DIAGNOSIS — I10 Essential (primary) hypertension: Secondary | ICD-10-CM | POA: Diagnosis not present

## 2016-06-09 DIAGNOSIS — I701 Atherosclerosis of renal artery: Secondary | ICD-10-CM | POA: Diagnosis not present

## 2016-06-09 DIAGNOSIS — I350 Nonrheumatic aortic (valve) stenosis: Secondary | ICD-10-CM

## 2016-06-09 DIAGNOSIS — Z01818 Encounter for other preprocedural examination: Secondary | ICD-10-CM | POA: Diagnosis present

## 2016-06-09 DIAGNOSIS — K219 Gastro-esophageal reflux disease without esophagitis: Secondary | ICD-10-CM | POA: Diagnosis not present

## 2016-06-09 DIAGNOSIS — E785 Hyperlipidemia, unspecified: Secondary | ICD-10-CM | POA: Diagnosis not present

## 2016-06-09 DIAGNOSIS — R011 Cardiac murmur, unspecified: Secondary | ICD-10-CM | POA: Diagnosis not present

## 2016-06-09 DIAGNOSIS — I481 Persistent atrial fibrillation: Secondary | ICD-10-CM | POA: Diagnosis not present

## 2016-06-09 DIAGNOSIS — I4510 Unspecified right bundle-branch block: Secondary | ICD-10-CM | POA: Diagnosis not present

## 2016-06-09 DIAGNOSIS — I739 Peripheral vascular disease, unspecified: Secondary | ICD-10-CM | POA: Diagnosis not present

## 2016-06-09 DIAGNOSIS — I2510 Atherosclerotic heart disease of native coronary artery without angina pectoris: Secondary | ICD-10-CM | POA: Diagnosis not present

## 2016-06-09 HISTORY — DX: Unspecified macular degeneration: H35.30

## 2016-06-09 LAB — BLOOD GAS, ARTERIAL
Acid-Base Excess: 0.8 mmol/L (ref 0.0–2.0)
Bicarbonate: 24.4 mmol/L (ref 20.0–28.0)
Drawn by: 470691
FIO2: 21
O2 Saturation: 97.1 %
Patient temperature: 98.6
pCO2 arterial: 35.3 mmHg (ref 32.0–48.0)
pH, Arterial: 7.454 — ABNORMAL HIGH (ref 7.350–7.450)
pO2, Arterial: 91.3 mmHg (ref 83.0–108.0)

## 2016-06-09 LAB — COMPREHENSIVE METABOLIC PANEL
ALT: 19 U/L (ref 17–63)
AST: 25 U/L (ref 15–41)
Albumin: 3.7 g/dL (ref 3.5–5.0)
Alkaline Phosphatase: 75 U/L (ref 38–126)
Anion gap: 8 (ref 5–15)
BUN: 18 mg/dL (ref 6–20)
CO2: 21 mmol/L — ABNORMAL LOW (ref 22–32)
Calcium: 9.2 mg/dL (ref 8.9–10.3)
Chloride: 108 mmol/L (ref 101–111)
Creatinine, Ser: 0.82 mg/dL (ref 0.61–1.24)
GFR calc Af Amer: >60 mL/min (ref >60)
GFR calc non Af Amer: >60 mL/min (ref >60)
Glucose, Bld: 88 mg/dL (ref 65–99)
Potassium: 4.2 mmol/L (ref 3.5–5.1)
Sodium: 137 mmol/L (ref 135–145)
Total Bilirubin: 1.4 mg/dL — ABNORMAL HIGH (ref 0.3–1.2)
Total Protein: 7.2 g/dL (ref 6.5–8.1)

## 2016-06-09 LAB — SURGICAL PCR SCREEN
MRSA, PCR: NEGATIVE
Staphylococcus aureus: NEGATIVE

## 2016-06-09 LAB — URINALYSIS, ROUTINE W REFLEX MICROSCOPIC
Bacteria, UA: NONE SEEN
Bilirubin Urine: NEGATIVE
Glucose, UA: NEGATIVE mg/dL
Ketones, ur: NEGATIVE mg/dL
Nitrite: NEGATIVE
Protein, ur: NEGATIVE mg/dL
Specific Gravity, Urine: 1.017 (ref 1.005–1.030)
Squamous Epithelial / LPF: NONE SEEN
pH: 6 (ref 5.0–8.0)

## 2016-06-09 LAB — TYPE AND SCREEN
ABO/RH(D): A POS
Antibody Screen: NEGATIVE

## 2016-06-09 LAB — PROTIME-INR
INR: 2.21
Prothrombin Time: 24.9 seconds — ABNORMAL HIGH (ref 11.4–15.2)

## 2016-06-09 LAB — CBC
HCT: 46.2 % (ref 39.0–52.0)
Hemoglobin: 15.9 g/dL (ref 13.0–17.0)
MCH: 33.1 pg (ref 26.0–34.0)
MCHC: 34.4 g/dL (ref 30.0–36.0)
MCV: 96.3 fL (ref 78.0–100.0)
Platelets: 99 10*3/uL — ABNORMAL LOW (ref 150–400)
RBC: 4.8 MIL/uL (ref 4.22–5.81)
RDW: 13.8 % (ref 11.5–15.5)
WBC: 6.8 10*3/uL (ref 4.0–10.5)

## 2016-06-09 LAB — APTT: aPTT: 41 seconds — ABNORMAL HIGH (ref 24–36)

## 2016-06-09 NOTE — Progress Notes (Addendum)
PCP - Dr Reynaldo Minium Cardiologist - Cr Cooper  Chest x-ray - 06/09/2016 EKG - 06/09/2016  ECHO - 05/18/16 Cardiac Cath - 05/12/16  Patient on coumadin, last dose 06/08/16 per MD instructions to stop 5 days prior to surgery. Patient denies shortness of breath, fever, cough and chest pain at PAT appointment, per pt son pt has not complained of chest pain, just shortness of breath on exertion.    Patient verbalized understanding of instructions that was given to them at the PAT appointment. Patient expressed that there were no further questions.  Patient was also instructed that they will need to review over the PAT instructions again at home before the surgery.  Pt forwarded to anesthesia d/t cardiac hx

## 2016-06-10 LAB — HEMOGLOBIN A1C
Hgb A1c MFr Bld: 5 % (ref 4.8–5.6)
Mean Plasma Glucose: 97 mg/dL

## 2016-06-12 MED ORDER — POTASSIUM CHLORIDE 2 MEQ/ML IV SOLN
80.0000 meq | INTRAVENOUS | Status: DC
  Filled 2016-06-12: qty 40

## 2016-06-12 MED ORDER — NOREPINEPHRINE BITARTRATE 1 MG/ML IV SOLN
0.0000 ug/min | INTRAVENOUS | Status: DC
  Filled 2016-06-12: qty 4

## 2016-06-12 MED ORDER — SODIUM CHLORIDE 0.9 % IV SOLN
INTRAVENOUS | Status: DC
  Filled 2016-06-12: qty 2.5

## 2016-06-12 MED ORDER — NITROGLYCERIN IN D5W 200-5 MCG/ML-% IV SOLN
2.0000 ug/min | INTRAVENOUS | Status: DC
  Filled 2016-06-12: qty 250

## 2016-06-12 MED ORDER — EPINEPHRINE PF 1 MG/ML IJ SOLN
0.0000 ug/min | INTRAVENOUS | Status: DC
  Filled 2016-06-12: qty 4

## 2016-06-12 MED ORDER — SODIUM CHLORIDE 0.9 % IV SOLN
30.0000 ug/min | INTRAVENOUS | Status: DC
  Filled 2016-06-12: qty 2

## 2016-06-12 MED ORDER — DOPAMINE-DEXTROSE 3.2-5 MG/ML-% IV SOLN
0.0000 ug/kg/min | INTRAVENOUS | Status: DC
  Filled 2016-06-12: qty 250

## 2016-06-12 MED ORDER — SODIUM CHLORIDE 0.9 % IV SOLN: INTRAVENOUS | Status: DC

## 2016-06-12 MED ORDER — SODIUM CHLORIDE 0.9 % IV SOLN
INTRAVENOUS | Status: DC
  Filled 2016-06-12: qty 30

## 2016-06-12 MED ORDER — MAGNESIUM SULFATE 50 % IJ SOLN
40.0000 meq | INTRAMUSCULAR | Status: DC
  Filled 2016-06-12: qty 10

## 2016-06-12 MED ORDER — DEXTROSE 5 % IV SOLN
1.5000 g | INTRAVENOUS | Status: AC
  Administered 2016-06-13: 1.5 g via INTRAVENOUS
  Filled 2016-06-12 (×2): qty 1.5

## 2016-06-12 MED ORDER — DEXMEDETOMIDINE HCL IN NACL 400 MCG/100ML IV SOLN
0.1000 ug/kg/h | INTRAVENOUS | Status: DC
Start: 2016-06-13 — End: 2016-06-13
  Filled 2016-06-12: qty 100

## 2016-06-12 MED ORDER — VANCOMYCIN HCL 10 G IV SOLR
1250.0000 mg | INTRAVENOUS | Status: AC
  Administered 2016-06-13: 1250 mg via INTRAVENOUS
  Filled 2016-06-12 (×2): qty 1250

## 2016-06-12 NOTE — Progress Notes (Signed)
Anesthesia chart review: Patient is an 83 year old male scheduled for tab or, transfemoral approach on 06/13/2016 by Dr. Sherren Mocha.  History includes severe aortic stenosis, CAD s/p PTCA ('80's-90's) s/p unsuccessful attempted PCI to mid RCA (could not pass balloon or Rotablator wire; no good PCI options) 05/24/12, chronic afib with right BBB, SSS, former smoker (quit '85), PVD (SMA and renal artery calcifications 12/03/12 CTA, mild-moderate carotid stenosis 11/2015), AAA s/p repair (aorto-bi-iliac bypass) '93, hypercholesterolemia, hypertension, GERD, PE ('64; in setting of right hip dislocation), GERD, macular degeneration, microscopic hematuria, skin cancer, cholecystectomy '14. Incidental cecal mass-like lesion and liver cirrhosis on 05/24/16 CT (non-emergent colonoscopy recommended).   PCP is Dr. Reynaldo Minium. Cardiologist is Dr. Burt Knack.  Meds include warfarin (to hold 5 days prior to surgery), amlodipine, Lipitor, HCTZ, Imdur, Ativan, Toprol-XL, nitroglycerin, Lovaza, ramipril, Lucentis (eye injections every 9 weeks), Vesicare.  BP (!) 156/57   Pulse (!) 53   Temp 36.5 C   Resp 18   Ht 5\' 9"  (1.753 m)   Wt 190 lb 8 oz (86.4 kg)   SpO2 95%   BMI 28.13 kg/m   Cardiac CT 05/23/16: IMPRESSION: 1. Trileaflet, severely thickened and symmetrically severely calcified aortic valve with severely restricted leaflet opening. There are minimal calcifications extending into the LVOT. Annular measurements suitable for delivery of a 29 mm Edwards-SAPIEN 3 TAVR valve. 2.  Sufficient annulus to coronary distance. 3.  Optimum Fluoroscopic Angle for Delivery:  LAO5 CAU 5. 4. There is a filling defect in the tip of a large left atrial appendage. This might represent lack of contrast or a thrombus. A TEE prior to the procedure is recommended.  Echo 05/18/16: Study Conclusions - Left ventricle: The cavity size was normal. Wall thickness was   normal. Systolic function was normal. The estimated  ejection   fraction was in the range of 55% to 60%. Wall motion was normal;   there were no regional wall motion abnormalities. - Aortic valve: There was severe stenosis. Valve area (VTI): 0.88   cm^2. Valve area (Vmax): 0.88 cm^2. Valve area (Vmean): 0.77   cm^2. - Mitral valve: Calcified annulus. Mildly thickened leaflets .   There was mild regurgitation. Valve area by pressure half-time:   1.93 cm^2. Valve area by continuity equation (using LVOT flow):   3.39 cm^2. - Left atrium: The atrium was severely dilated. - Right atrium: The atrium was moderately to severely dilated. - Pulmonary arteries: Systolic pressure was moderately increased.   PA peak pressure: 60 mm Hg (S).  Cardiac cath 05/12/16: Dominance: Right  Left Anterior Descending  There is mild the vessel.  Mid LAD to Dist LAD lesion, 25% stenosed.  Ramus Intermedius  The vessel exhibits minimal luminal irregularities.  Left Circumflex  There is mild the vessel.  Mid Cx lesion, 30% stenosed.  Right Coronary Artery  Prox RCA to Mid RCA lesion, 40% stenosed. The lesion is severely calcified.  Mid RCA lesion, 95% stenosed. The lesion is severely calcified.  Dist RCA lesion, 40% stenosed. The lesion is moderately calcified.  Conclusions: 1. Severe RCA stenosis with heavy calcification unchanged from 2014 study 2. Minor nonobstructive LAD and LCx stenosis 3. Severely calcified aortic valve with severe aortic stenosis (mean gradient 35 mmHg and AVA 1.06 square cm) Recommend: Further evaluation by the multidisciplinary heart team for consideration of TAVR/PCI or other treatment options. Will obtain updated echo and CTA studies.  Carotid U/S 11/29/15: Impressions: Heterogeneous plaque, bilaterally. Progression of right ICA disease, with higher velocities in prior exam,  now in 40-59% range of stenosis. Stable 1-39% left ICA stenosis. Normal subclavian arteries bilaterally. Patent right vertebral artery with retrograde flow.  Patent left vertebral artery with antegrade flow.  CTA chest/abd/pelvis 05/23/16 (ordered by Dr. Burt Knack): IMPRESSION: 1. Vascular findings and measurements pertinent to potential TAVR procedure, as detailed above. The patient does appear to have suitable pelvic arterial access, however, the following features should be noted. Both common femoral arteries are densely calcified, at times circumferentially calcified. Patient has undergone prior aorto bi-iliac bypass graft which extends to the external iliac arteries bilaterally. Portions of the proximal native external iliac arteries are patent, and communicate with the remaining pelvic arterial vasculature. At the time of attempted retrograde catheter placement during potential TAVR, this should be noted. 2. Severe thickening calcification of the aortic valve, compatible with the reported clinical history of aortic stenosis. 3. Cardiomegaly with left atrial dilatation, and a filling defect in the tip of the left atrial appendage highly concerning for left atrial appendage thrombus. This places the patient at risk for systemic embolization. 4. **An incidental finding of potential clinical significance has been found. 4.6 x 5.6 x 3.7 cm masslike lesion in the cecum adjacent to the ileocecal valve may represent a large polyp or colonic neoplasm. Correlation with nonemergent colonoscopy is strongly recommended in the near future to better evaluate this finding.** 5. 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 6. Small to moderate partially loculated right pleural effusion and trace left pleural effusion lying dependently. 7. Cirrhosis with small volume of ascites. 8. Colonic diverticulosis without evidence to suggest acute diverticulitis at this time. 9. Additional incidental findings, as above.  Preoperative EKG, PFTs, CXR, and labs noted. A1c 5.0. INR 2.21, PTT 41. PLT 99K (previously 115K on 05/12/16). Total  bili 1.4, but AST, ALT WNL. H/H 15.9/46.2. Cr 0.82. Dr. Burt Knack has marked labs as reviewed. Patient to get repeat PT/PTT on the day of surgery.   If same day lab results acceptable and otherwise no acute changes then I would anticipate that he could proceed as planned. Dr. Burt Knack felt that with his underlying conduction disease, patient would be at higher risk of requiring a permanent pacemaker post-TAVR. He did have a small filling defect in the tip of the LAA on cardiac CT that could a small thrombus or just lack on contrast, but because he has been on Coumadin Dr. Cyndia Bent did not feel that this really altered the decision making and can be further evaluated in the OR with TEE.   George Hugh [facility] Short Stay Center/Anesthesiology Phone [phone] 06/12/2016 11:38 AM

## 2016-06-13 ENCOUNTER — Encounter (HOSPITAL_COMMUNITY): Payer: Self-pay | Admitting: *Deleted

## 2016-06-13 ENCOUNTER — Inpatient Hospital Stay (HOSPITAL_COMMUNITY): Payer: Medicare Other

## 2016-06-13 ENCOUNTER — Encounter (HOSPITAL_COMMUNITY)
Admission: RE | Disposition: A | Discharge status: Home / Self Care | Payer: Self-pay | Source: Ambulatory Visit | Attending: Cardiovascular Disease

## 2016-06-13 ENCOUNTER — Inpatient Hospital Stay (HOSPITAL_COMMUNITY): Payer: Medicare Other | Admitting: Vascular Surgery

## 2016-06-13 ENCOUNTER — Ambulatory Visit (HOSPITAL_COMMUNITY)
Admission: RE | Admit: 2016-06-13 | Discharge: 2016-06-13 | Disposition: A | Discharge status: Home / Self Care | Payer: Medicare Other | Source: Ambulatory Visit | Attending: Cardiovascular Disease | Admitting: Cardiovascular Disease

## 2016-06-13 ENCOUNTER — Inpatient Hospital Stay (HOSPITAL_COMMUNITY)
Admission: RE | Admit: 2016-06-13 | Discharge: 2016-06-15 | DRG: 267 | Disposition: A | Discharge status: Home / Self Care | Payer: Medicare Other | Source: Ambulatory Visit | Attending: Cardiovascular Disease | Admitting: Cardiovascular Disease

## 2016-06-13 DIAGNOSIS — I481 Persistent atrial fibrillation: Secondary | ICD-10-CM | POA: Diagnosis present

## 2016-06-13 DIAGNOSIS — E785 Hyperlipidemia, unspecified: Secondary | ICD-10-CM | POA: Diagnosis not present

## 2016-06-13 DIAGNOSIS — Z96653 Presence of artificial knee joint, bilateral: Secondary | ICD-10-CM | POA: Diagnosis present

## 2016-06-13 DIAGNOSIS — I739 Peripheral vascular disease, unspecified: Secondary | ICD-10-CM | POA: Diagnosis present

## 2016-06-13 DIAGNOSIS — Z79899 Other long term (current) drug therapy: Secondary | ICD-10-CM

## 2016-06-13 DIAGNOSIS — J90 Pleural effusion, not elsewhere classified: Secondary | ICD-10-CM | POA: Diagnosis not present

## 2016-06-13 DIAGNOSIS — Z006 Encounter for examination for normal comparison and control in clinical research program: Secondary | ICD-10-CM

## 2016-06-13 DIAGNOSIS — J9811 Atelectasis: Secondary | ICD-10-CM

## 2016-06-13 DIAGNOSIS — I350 Nonrheumatic aortic (valve) stenosis: Secondary | ICD-10-CM

## 2016-06-13 DIAGNOSIS — I10 Essential (primary) hypertension: Secondary | ICD-10-CM | POA: Diagnosis not present

## 2016-06-13 DIAGNOSIS — Z86711 Personal history of pulmonary embolism: Secondary | ICD-10-CM | POA: Diagnosis not present

## 2016-06-13 DIAGNOSIS — D696 Thrombocytopenia, unspecified: Secondary | ICD-10-CM | POA: Diagnosis not present

## 2016-06-13 DIAGNOSIS — Z85828 Personal history of other malignant neoplasm of skin: Secondary | ICD-10-CM | POA: Diagnosis not present

## 2016-06-13 DIAGNOSIS — R011 Cardiac murmur, unspecified: Secondary | ICD-10-CM | POA: Diagnosis present

## 2016-06-13 DIAGNOSIS — Z952 Presence of prosthetic heart valve: Secondary | ICD-10-CM

## 2016-06-13 DIAGNOSIS — K219 Gastro-esophageal reflux disease without esophagitis: Secondary | ICD-10-CM | POA: Diagnosis not present

## 2016-06-13 DIAGNOSIS — I2510 Atherosclerotic heart disease of native coronary artery without angina pectoris: Secondary | ICD-10-CM | POA: Diagnosis not present

## 2016-06-13 DIAGNOSIS — I25119 Atherosclerotic heart disease of native coronary artery with unspecified angina pectoris: Secondary | ICD-10-CM | POA: Diagnosis present

## 2016-06-13 DIAGNOSIS — I4891 Unspecified atrial fibrillation: Secondary | ICD-10-CM | POA: Diagnosis not present

## 2016-06-13 DIAGNOSIS — I4510 Unspecified right bundle-branch block: Secondary | ICD-10-CM | POA: Diagnosis not present

## 2016-06-13 DIAGNOSIS — Z8249 Family history of ischemic heart disease and other diseases of the circulatory system: Secondary | ICD-10-CM | POA: Diagnosis not present

## 2016-06-13 DIAGNOSIS — Z9049 Acquired absence of other specified parts of digestive tract: Secondary | ICD-10-CM | POA: Diagnosis not present

## 2016-06-13 DIAGNOSIS — Z7901 Long term (current) use of anticoagulants: Secondary | ICD-10-CM | POA: Diagnosis not present

## 2016-06-13 DIAGNOSIS — Z87891 Personal history of nicotine dependence: Secondary | ICD-10-CM

## 2016-06-13 DIAGNOSIS — Z954 Presence of other heart-valve replacement: Secondary | ICD-10-CM | POA: Diagnosis not present

## 2016-06-13 DIAGNOSIS — I4821 Permanent atrial fibrillation: Secondary | ICD-10-CM | POA: Diagnosis present

## 2016-06-13 DIAGNOSIS — I701 Atherosclerosis of renal artery: Secondary | ICD-10-CM | POA: Diagnosis not present

## 2016-06-13 DIAGNOSIS — E7800 Pure hypercholesterolemia, unspecified: Secondary | ICD-10-CM | POA: Diagnosis not present

## 2016-06-13 DIAGNOSIS — I482 Chronic atrial fibrillation: Secondary | ICD-10-CM | POA: Diagnosis not present

## 2016-06-13 DIAGNOSIS — I501 Left ventricular failure: Secondary | ICD-10-CM | POA: Diagnosis not present

## 2016-06-13 HISTORY — PX: TRANSCATHETER AORTIC VALVE REPLACEMENT, TRANSFEMORAL: SHX6400

## 2016-06-13 HISTORY — DX: Presence of prosthetic heart valve: Z95.2

## 2016-06-13 HISTORY — PX: TEE WITHOUT CARDIOVERSION: SHX5443

## 2016-06-13 LAB — POCT I-STAT 4, (NA,K, GLUC, HGB,HCT)
Glucose, Bld: 98 mg/dL (ref 65–99)
HCT: 43 % (ref 39.0–52.0)
Hemoglobin: 14.6 g/dL (ref 13.0–17.0)
Potassium: 4.2 mmol/L (ref 3.5–5.1)
Sodium: 142 mmol/L (ref 135–145)

## 2016-06-13 LAB — POCT I-STAT 3, ART BLOOD GAS (G3+)
Acid-base deficit: 3 mmol/L — ABNORMAL HIGH (ref 0.0–2.0)
Bicarbonate: 22.5 mmol/L (ref 20.0–28.0)
O2 Saturation: 95 %
Patient temperature: 97.1
TCO2: 24 mmol/L (ref 0–100)
pCO2 arterial: 40.6 mmHg (ref 32.0–48.0)
pH, Arterial: 7.347 — ABNORMAL LOW (ref 7.350–7.450)
pO2, Arterial: 78 mmHg — ABNORMAL LOW (ref 83.0–108.0)

## 2016-06-13 LAB — POCT I-STAT, CHEM 8
BUN: 15 mg/dL (ref 6–20)
BUN: 14 mg/dL (ref 6–20)
BUN: 14 mg/dL (ref 6–20)
Calcium, Ion: 1.22 mmol/L (ref 1.15–1.40)
Calcium, Ion: 1.19 mmol/L (ref 1.15–1.40)
Calcium, Ion: 1.19 mmol/L (ref 1.15–1.40)
Chloride: 105 mmol/L (ref 101–111)
Chloride: 105 mmol/L (ref 101–111)
Chloride: 105 mmol/L (ref 101–111)
Creatinine, Ser: 0.6 mg/dL — ABNORMAL LOW (ref 0.61–1.24)
Creatinine, Ser: 0.6 mg/dL — ABNORMAL LOW (ref 0.61–1.24)
Creatinine, Ser: 0.7 mg/dL (ref 0.61–1.24)
Glucose, Bld: 94 mg/dL (ref 65–99)
Glucose, Bld: 102 mg/dL — ABNORMAL HIGH (ref 65–99)
Glucose, Bld: 102 mg/dL — ABNORMAL HIGH (ref 65–99)
HCT: 41 % (ref 39.0–52.0)
HCT: 39 % (ref 39.0–52.0)
HCT: 39 % (ref 39.0–52.0)
Hemoglobin: 13.9 g/dL (ref 13.0–17.0)
Hemoglobin: 13.3 g/dL (ref 13.0–17.0)
Hemoglobin: 13.3 g/dL (ref 13.0–17.0)
Potassium: 3.8 mmol/L (ref 3.5–5.1)
Potassium: 3.9 mmol/L (ref 3.5–5.1)
Potassium: 4 mmol/L (ref 3.5–5.1)
Sodium: 141 mmol/L (ref 135–145)
Sodium: 140 mmol/L (ref 135–145)
Sodium: 140 mmol/L (ref 135–145)
TCO2: 27 mmol/L (ref 0–100)
TCO2: 25 mmol/L (ref 0–100)
TCO2: 25 mmol/L (ref 0–100)

## 2016-06-13 LAB — PROTIME-INR
INR: 1.22
INR: 1.13
Prothrombin Time: 15.4 seconds — ABNORMAL HIGH (ref 11.4–15.2)
Prothrombin Time: 14.5 seconds (ref 11.4–15.2)

## 2016-06-13 LAB — CBC
HCT: 42.7 % (ref 39.0–52.0)
Hemoglobin: 14.4 g/dL (ref 13.0–17.0)
MCH: 32.6 pg (ref 26.0–34.0)
MCHC: 33.7 g/dL (ref 30.0–36.0)
MCV: 96.6 fL (ref 78.0–100.0)
Platelets: 89 10*3/uL — ABNORMAL LOW (ref 150–400)
RBC: 4.42 MIL/uL (ref 4.22–5.81)
RDW: 13.6 % (ref 11.5–15.5)
WBC: 6.3 10*3/uL (ref 4.0–10.5)

## 2016-06-13 LAB — APTT
aPTT: 35 seconds (ref 24–36)
aPTT: 39 seconds — ABNORMAL HIGH (ref 24–36)

## 2016-06-13 SURGERY — IMPLANTATION, AORTIC VALVE, TRANSCATHETER, FEMORAL APPROACH
Anesthesia: General | Site: Chest

## 2016-06-13 MED ORDER — IODIXANOL 320 MG/ML IV SOLN
INTRAVENOUS | Status: DC | PRN
  Administered 2016-06-13: 55.6 mL via INTRA_ARTERIAL

## 2016-06-13 MED ORDER — ASPIRIN 81 MG PO CHEW: 324.0000 mg | CHEWABLE_TABLET | Freq: Every day | ORAL | Status: DC

## 2016-06-13 MED ORDER — MORPHINE SULFATE (PF) 4 MG/ML IV SOLN: 1.0000 mg | INTRAVENOUS | Status: DC | PRN

## 2016-06-13 MED ORDER — SODIUM CHLORIDE 0.9 % IV SOLN
1.0000 mL/kg/h | INTRAVENOUS | Status: AC
  Administered 2016-06-13: 1 mL/kg/h via INTRAVENOUS

## 2016-06-13 MED ORDER — OXYCODONE HCL 5 MG PO TABS: 5.0000 mg | ORAL_TABLET | ORAL | Status: DC | PRN

## 2016-06-13 MED ORDER — SUGAMMADEX SODIUM 200 MG/2ML IV SOLN
INTRAVENOUS | Status: DC | PRN
  Administered 2016-06-13: 332 mg via INTRAVENOUS

## 2016-06-13 MED ORDER — CHLORHEXIDINE GLUCONATE 0.12 % MT SOLN
15.0000 mL | Freq: Once | OROMUCOSAL | Status: AC
  Administered 2016-06-13: 15 mL via OROMUCOSAL
  Filled 2016-06-13: qty 15

## 2016-06-13 MED ORDER — POTASSIUM CHLORIDE 2 MEQ/ML IV SOLN: 30.0000 meq | Freq: Once | INTRAVENOUS | Status: DC

## 2016-06-13 MED ORDER — LACTATED RINGERS IV SOLN
INTRAVENOUS | Status: DC | PRN
  Administered 2016-06-13: 13:00:00 via INTRAVENOUS

## 2016-06-13 MED ORDER — PROPOFOL 10 MG/ML IV BOLUS
INTRAVENOUS | Status: DC | PRN
  Administered 2016-06-13: 50 mg via INTRAVENOUS
  Administered 2016-06-13 (×2): 20 mg via INTRAVENOUS

## 2016-06-13 MED ORDER — CEFUROXIME SODIUM 1.5 G IJ SOLR
1.5000 g | Freq: Two times a day (BID) | INTRAMUSCULAR | Status: AC
  Administered 2016-06-13 – 2016-06-15 (×4): 1.5 g via INTRAVENOUS
  Filled 2016-06-13 (×5): qty 1.5

## 2016-06-13 MED ORDER — CHLORHEXIDINE GLUCONATE 0.12 % MT SOLN
15.0000 mL | OROMUCOSAL | Status: AC
  Administered 2016-06-13: 15 mL via OROMUCOSAL

## 2016-06-13 MED ORDER — METOPROLOL TARTRATE 5 MG/5ML IV SOLN
2.5000 mg | INTRAVENOUS | Status: DC | PRN
  Filled 2016-06-13: qty 5

## 2016-06-13 MED ORDER — METOPROLOL TARTRATE 12.5 MG HALF TABLET: 12.5000 mg | ORAL_TABLET | Freq: Two times a day (BID) | ORAL | Status: DC

## 2016-06-13 MED ORDER — FENTANYL CITRATE (PF) 100 MCG/2ML IJ SOLN
INTRAMUSCULAR | Status: DC | PRN
  Administered 2016-06-13: 150 ug via INTRAVENOUS

## 2016-06-13 MED ORDER — SODIUM CHLORIDE 0.9 % IV SOLN: 1.0000 mL/kg/h | INTRAVENOUS | Status: AC

## 2016-06-13 MED ORDER — SUGAMMADEX SODIUM 500 MG/5ML IV SOLN
INTRAVENOUS | Status: AC
  Filled 2016-06-13: qty 5

## 2016-06-13 MED ORDER — SODIUM CHLORIDE 0.9 % IV SOLN
INTRAVENOUS | Status: DC | PRN
  Administered 2016-06-13: 1500 mL

## 2016-06-13 MED ORDER — FENTANYL CITRATE (PF) 250 MCG/5ML IJ SOLN
INTRAMUSCULAR | Status: AC
  Filled 2016-06-13: qty 5

## 2016-06-13 MED ORDER — VANCOMYCIN HCL IN DEXTROSE 1-5 GM/200ML-% IV SOLN
1000.0000 mg | Freq: Once | INTRAVENOUS | Status: AC
  Administered 2016-06-13: 1000 mg via INTRAVENOUS
  Filled 2016-06-13: qty 200

## 2016-06-13 MED ORDER — ROCURONIUM BROMIDE 100 MG/10ML IV SOLN
INTRAVENOUS | Status: DC | PRN
  Administered 2016-06-13: 20 mg via INTRAVENOUS
  Administered 2016-06-13: 30 mg via INTRAVENOUS
  Administered 2016-06-13: 50 mg via INTRAVENOUS

## 2016-06-13 MED ORDER — FAMOTIDINE IN NACL 20-0.9 MG/50ML-% IV SOLN
20.0000 mg | Freq: Two times a day (BID) | INTRAVENOUS | Status: AC
  Administered 2016-06-14: 20 mg via INTRAVENOUS
  Filled 2016-06-13: qty 50

## 2016-06-13 MED ORDER — ONDANSETRON HCL 4 MG/2ML IJ SOLN
INTRAMUSCULAR | Status: AC
  Filled 2016-06-13: qty 2

## 2016-06-13 MED ORDER — LACTATED RINGERS IV SOLN: 500.0000 mL | Freq: Once | INTRAVENOUS | Status: DC | PRN

## 2016-06-13 MED ORDER — PROTAMINE SULFATE 10 MG/ML IV SOLN
INTRAVENOUS | Status: AC
  Filled 2016-06-13: qty 5

## 2016-06-13 MED ORDER — HEPARIN SODIUM (PORCINE) 1000 UNIT/ML IJ SOLN
INTRAMUSCULAR | Status: DC | PRN
  Administered 2016-06-13: 6000 [IU] via INTRAVENOUS

## 2016-06-13 MED ORDER — ALBUMIN HUMAN 5 % IV SOLN: 250.0000 mL | INTRAVENOUS | Status: AC | PRN

## 2016-06-13 MED ORDER — DEXMEDETOMIDINE HCL IN NACL 400 MCG/100ML IV SOLN
INTRAVENOUS | Status: DC | PRN
Start: 2016-06-13 — End: 2016-06-13
  Administered 2016-06-13: .4 ug/kg/h via INTRAVENOUS

## 2016-06-13 MED ORDER — GLYCOPYRROLATE 0.2 MG/ML IJ SOLN
INTRAMUSCULAR | Status: DC | PRN
  Administered 2016-06-13: 0.2 mg via INTRAVENOUS

## 2016-06-13 MED ORDER — 0.9 % SODIUM CHLORIDE (POUR BTL) OPTIME
TOPICAL | Status: DC | PRN
  Administered 2016-06-13: 5000 mL

## 2016-06-13 MED ORDER — METOPROLOL TARTRATE 25 MG/10 ML ORAL SUSPENSION: 12.5000 mg | Freq: Two times a day (BID) | ORAL | Status: DC

## 2016-06-13 MED ORDER — CHLORHEXIDINE GLUCONATE 4 % EX LIQD: 30.0000 mL | CUTANEOUS | Status: DC

## 2016-06-13 MED ORDER — PROTAMINE SULFATE 10 MG/ML IV SOLN
INTRAVENOUS | Status: DC | PRN
  Administered 2016-06-13 (×3): 20 mg via INTRAVENOUS

## 2016-06-13 MED ORDER — ONDANSETRON HCL 4 MG/2ML IJ SOLN: 4.0000 mg | Freq: Four times a day (QID) | INTRAMUSCULAR | Status: DC | PRN

## 2016-06-13 MED ORDER — CHLORHEXIDINE GLUCONATE 4 % EX LIQD: 60.0000 mL | Freq: Once | CUTANEOUS | Status: DC

## 2016-06-13 MED ORDER — SODIUM CHLORIDE 0.9 % IV SOLN: 0.0000 ug/min | INTRAVENOUS | Status: DC

## 2016-06-13 MED ORDER — ASPIRIN EC 325 MG PO TBEC
325.0000 mg | DELAYED_RELEASE_TABLET | Freq: Every day | ORAL | Status: DC
  Administered 2016-06-14: 325 mg via ORAL
  Filled 2016-06-13: qty 1

## 2016-06-13 MED ORDER — NITROGLYCERIN IN D5W 200-5 MCG/ML-% IV SOLN
0.0000 ug/min | INTRAVENOUS | Status: DC
Start: 2016-06-13 — End: 2016-06-14
  Filled 2016-06-13: qty 250

## 2016-06-13 MED ORDER — LIDOCAINE HCL (CARDIAC) 20 MG/ML IV SOLN
INTRAVENOUS | Status: DC | PRN
  Administered 2016-06-13: 60 mg via INTRAVENOUS

## 2016-06-13 MED ORDER — AMLODIPINE BESYLATE 5 MG PO TABS
5.0000 mg | ORAL_TABLET | Freq: Every day | ORAL | Status: DC
  Administered 2016-06-14: 5 mg via ORAL
  Filled 2016-06-13: qty 1

## 2016-06-13 MED ORDER — METOPROLOL SUCCINATE ER 50 MG PO TB24: 50.0000 mg | ORAL_TABLET | Freq: Every day | ORAL | Status: DC

## 2016-06-13 MED ORDER — TRAMADOL HCL 50 MG PO TABS: 50.0000 mg | ORAL_TABLET | ORAL | Status: DC | PRN

## 2016-06-13 MED ORDER — MIDAZOLAM HCL 2 MG/2ML IJ SOLN: 2.0000 mg | INTRAMUSCULAR | Status: DC | PRN

## 2016-06-13 MED ORDER — ORAL CARE MOUTH RINSE
15.0000 mL | Freq: Two times a day (BID) | OROMUCOSAL | Status: DC
  Administered 2016-06-14: 15 mL via OROMUCOSAL

## 2016-06-13 MED ORDER — PANTOPRAZOLE SODIUM 40 MG PO TBEC
40.0000 mg | DELAYED_RELEASE_TABLET | Freq: Every day | ORAL | Status: DC
  Administered 2016-06-15: 40 mg via ORAL
  Filled 2016-06-13: qty 1

## 2016-06-13 MED ORDER — DEXTROSE 5 % IV SOLN
INTRAVENOUS | Status: DC | PRN
  Administered 2016-06-13: 1 ug/min via INTRAVENOUS

## 2016-06-13 SURGICAL SUPPLY — 107 items
ADAPTER UNIV SWAN GANZ BIP (ADAPTER) ×2 IMPLANT
ADAPTER UNV SWAN GANZ BIP (ADAPTER) ×1
ADH SKN CLS APL DERMABOND .7 (GAUZE/BANDAGES/DRESSINGS) ×2
ADPR CATH UNV NS SG CATH (ADAPTER) ×2
ATTRACTOMAT 16X20 MAGNETIC DRP (DRAPES) IMPLANT
BAG BANDED W/RUBBER/TAPE 36X54 (MISCELLANEOUS) ×3 IMPLANT
BAG DECANTER FOR FLEXI CONT (MISCELLANEOUS) IMPLANT
BAG EQP BAND 135X91 W/RBR TAPE (MISCELLANEOUS) ×2
BAG SNAP BAND KOVER 36X36 (MISCELLANEOUS) ×6 IMPLANT
BLADE 10 SAFETY STRL DISP (BLADE) ×3 IMPLANT
BLADE CLIPPER SURG (BLADE) IMPLANT
BLADE STERNUM SYSTEM 6 (BLADE) ×3 IMPLANT
CABLE PACING FASLOC BIEGE (MISCELLANEOUS) ×3 IMPLANT
CABLE PACING FASLOC BLUE (MISCELLANEOUS) ×2 IMPLANT
CANISTER SUCT 3000ML PPV (MISCELLANEOUS) IMPLANT
CANNULA FEM VENOUS REMOTE 22FR (CANNULA) IMPLANT
CANNULA OPTISITE PERFUSION 16F (CANNULA) IMPLANT
CANNULA OPTISITE PERFUSION 18F (CANNULA) IMPLANT
CATH DIAG EXPO 6F VENT PIG 145 (CATHETERS) ×6 IMPLANT
CATH EXPO 5FR AL1 (CATHETERS) ×3 IMPLANT
CATH EXPO 5FR FR4 (CATHETERS) ×1 IMPLANT
CATH S G BIP PACING (SET/KITS/TRAYS/PACK) ×6 IMPLANT
CLIP TI MEDIUM 24 (CLIP) ×3 IMPLANT
CLIP TI WIDE RED SMALL 24 (CLIP) ×3 IMPLANT
CONT SPEC 4OZ CLIKSEAL STRL BL (MISCELLANEOUS) ×6 IMPLANT
COVER BACK TABLE 60X90IN (DRAPES) ×3 IMPLANT
COVER BACK TABLE 80X110 HD (DRAPES) ×3 IMPLANT
COVER DOME SNAP 22 D (MISCELLANEOUS) ×3 IMPLANT
COVER MAYO STAND STRL (DRAPES) ×3 IMPLANT
CRADLE DONUT ADULT HEAD (MISCELLANEOUS) ×3 IMPLANT
DERMABOND ADVANCED (GAUZE/BANDAGES/DRESSINGS) ×1
DERMABOND ADVANCED .7 DNX12 (GAUZE/BANDAGES/DRESSINGS) ×2 IMPLANT
DEVICE CLOSURE PERCLS PRGLD 6F (VASCULAR PRODUCTS) IMPLANT
DRAPE INCISE IOBAN 66X45 STRL (DRAPES) IMPLANT
DRAPE SLUSH MACHINE 52X66 (DRAPES) ×3 IMPLANT
DRSG TEGADERM 4X4.75 (GAUZE/BANDAGES/DRESSINGS) ×4 IMPLANT
ELECT REM PT RETURN 9FT ADLT (ELECTROSURGICAL) ×6
ELECTRODE REM PT RTRN 9FT ADLT (ELECTROSURGICAL) ×4 IMPLANT
FELT TEFLON 1X6 (MISCELLANEOUS) ×1 IMPLANT
FELT TEFLON 6X6 (MISCELLANEOUS) ×3 IMPLANT
FEMORAL VENOUS CANN RAP (CANNULA) IMPLANT
GAUZE SPONGE 4X4 12PLY STRL (GAUZE/BANDAGES/DRESSINGS) ×3 IMPLANT
GAUZE SPONGE 4X4 12PLY STRL LF (GAUZE/BANDAGES/DRESSINGS) ×2 IMPLANT
GLOVE BIO SURGEON STRL SZ7.5 (GLOVE) ×4 IMPLANT
GLOVE BIO SURGEON STRL SZ8 (GLOVE) ×6 IMPLANT
GLOVE EUDERMIC 7 POWDERFREE (GLOVE) ×3 IMPLANT
GLOVE ORTHO TXT STRL SZ7.5 (GLOVE) ×4 IMPLANT
GOWN STRL REUS W/ TWL LRG LVL3 (GOWN DISPOSABLE) ×6 IMPLANT
GOWN STRL REUS W/ TWL XL LVL3 (GOWN DISPOSABLE) ×12 IMPLANT
GOWN STRL REUS W/TWL LRG LVL3 (GOWN DISPOSABLE) ×9
GOWN STRL REUS W/TWL XL LVL3 (GOWN DISPOSABLE) ×18
GUIDEWIRE ANGLED .035X150CM (WIRE) ×1 IMPLANT
GUIDEWIRE SAF TJ AMPL .035X180 (WIRE) ×3 IMPLANT
GUIDEWIRE SAFE TJ AMPLATZ EXST (WIRE) ×3 IMPLANT
GUIDEWIRE STRAIGHT .035 260CM (WIRE) ×3 IMPLANT
GUIDEWIRE WHOLEY .035 145 JTIP (WIRE) ×1 IMPLANT
INSERT FOGARTY 61MM (MISCELLANEOUS) ×3 IMPLANT
INSERT FOGARTY SM (MISCELLANEOUS) IMPLANT
INSERT FOGARTY XLG (MISCELLANEOUS) IMPLANT
KIT BASIN OR (CUSTOM PROCEDURE TRAY) ×3 IMPLANT
KIT DILATOR VASC 18G NDL (KITS) IMPLANT
KIT HEART LEFT (KITS) ×3 IMPLANT
KIT ROOM TURNOVER OR (KITS) ×3 IMPLANT
KIT SUCTION CATH 14FR (SUCTIONS) ×7 IMPLANT
NDL PERC 18GX7CM (NEEDLE) ×2 IMPLANT
NEEDLE PERC 18GX7CM (NEEDLE) ×3 IMPLANT
NS IRRIG 1000ML POUR BTL (IV SOLUTION) ×9 IMPLANT
PACK AORTA (CUSTOM PROCEDURE TRAY) ×3 IMPLANT
PAD ARMBOARD 7.5X6 YLW CONV (MISCELLANEOUS) ×6 IMPLANT
PAD ELECT DEFIB RADIOL ZOLL (MISCELLANEOUS) ×3 IMPLANT
PATCH TACHOSII LRG 9.5X4.8 (VASCULAR PRODUCTS) IMPLANT
PERCLOSE PROGLIDE 6F (VASCULAR PRODUCTS) ×6
SET MICROPUNCTURE 5F STIFF (MISCELLANEOUS) ×3 IMPLANT
SHEATH AVANTI 11CM 8FR (MISCELLANEOUS) ×3 IMPLANT
SHEATH BRITE TIP 8FR 35CM (SHEATH) ×1 IMPLANT
SHEATH PINNACLE 6F 10CM (SHEATH) ×6 IMPLANT
SLEEVE REPOSITIONING LENGTH 30 (MISCELLANEOUS) ×3 IMPLANT
SPONGE LAP 4X18 X RAY DECT (DISPOSABLE) ×3 IMPLANT
STOPCOCK MORSE 400PSI 3WAY (MISCELLANEOUS) ×18 IMPLANT
SUT ETHIBOND X763 2 0 SH 1 (SUTURE) IMPLANT
SUT GORETEX CV 4 TH 22 36 (SUTURE) ×1 IMPLANT
SUT GORETEX CV4 TH-18 (SUTURE) IMPLANT
SUT GORETEX TH-18 36 INCH (SUTURE) ×2 IMPLANT
SUT MNCRL AB 3-0 PS2 18 (SUTURE) ×1 IMPLANT
SUT PROLENE 3 0 SH1 36 (SUTURE) IMPLANT
SUT PROLENE 4 0 RB 1 (SUTURE)
SUT PROLENE 4-0 RB1 .5 CRCL 36 (SUTURE) IMPLANT
SUT PROLENE 5 0 C 1 36 (SUTURE) IMPLANT
SUT PROLENE 6 0 C 1 30 (SUTURE) IMPLANT
SUT SILK  1 MH (SUTURE) ×2
SUT SILK 1 MH (SUTURE) ×2 IMPLANT
SUT SILK 2 0 SH CR/8 (SUTURE) IMPLANT
SUT VIC AB 2-0 CT1 27 (SUTURE)
SUT VIC AB 2-0 CT1 TAPERPNT 27 (SUTURE) IMPLANT
SUT VIC AB 2-0 CTX 36 (SUTURE) IMPLANT
SUT VIC AB 3-0 SH 8-18 (SUTURE) IMPLANT
SYR 10ML LL (SYRINGE) ×9 IMPLANT
SYR 30ML LL (SYRINGE) ×6 IMPLANT
SYR 50ML LL SCALE MARK (SYRINGE) ×3 IMPLANT
TOWEL OR 17X26 10 PK STRL BLUE (TOWEL DISPOSABLE) ×6 IMPLANT
TRANSDUCER W/STOPCOCK (MISCELLANEOUS) ×6 IMPLANT
TRAY FOLEY SILVER 14FR TEMP (SET/KITS/TRAYS/PACK) ×3 IMPLANT
TUBE SUCT INTRACARD DLP 20F (MISCELLANEOUS) IMPLANT
TUBING HIGH PRESSURE 120CM (CONNECTOR) ×3 IMPLANT
VALVE HEART TRANSCATH SZ3 29MM (Prosthesis & Implant Heart) ×1 IMPLANT
WIRE AMPLATZ SS-J .035X180CM (WIRE) ×3 IMPLANT
WIRE BENTSON .035X145CM (WIRE) ×3 IMPLANT

## 2016-06-13 NOTE — H&P (View-Only) (Signed)
[facility] CONSULTATION REPORT  Referring Provider is Sherren Mocha, MD PCP is Geoffery Lyons, MD  Chief Complaint  Patient presents with  . Aortic Stenosis    eval for AVR vs TAVR...CATH 3/30, ECHO 4/5, CTA C/A/P, CARD MORPH 4/10  . Follow-up    EXERCISE 4/10, PFT 4/5    HPI:  Patient is an 83 year old male with history of aortic stenosis, coronary artery disease, long-standing persistent atrial fibrillation on chronic warfarin anticoagulation, GE reflux disease, and abdominal aortic aneurysm status post open surgical repair in the distant past who has been referred for surgical consultation to discuss treatment options for management of severe symptomatic aortic stenosis. Patient has long history of heart murmur and aortic stenosis discovered nearly 20 years ago when he first presented with atrial fibrillation. He developed symptomatic coronary artery disease and underwent percutaneous coronary intervention on multiple occasions in the past. He has been followed for the last several years by Dr. Burt Knack.  In 2014 he was hospitalized for unstable angina. Diagnostic cardiac catheterization revealed 99% stenosis of mid right coronary artery without any significant disease in the left coronary system. PCI was attempted but unsuccessful because of severe calcification and chronicity of disease.  Since then the patient has done remarkably well on long-term medical therapy. Serial echocardiograms have documented a gradual progression severity of aortic stenosis. The patient recently developed fairly sudden onset of exertional shortness of breath without chest discomfort. He has remained remarkably active physically, but recently when on a walk he experienced a prolonged episode of shortness of breath that developed when he was trying to walk up a hill. He recovered fairly quickly with rest. Since then he has noticed  decreased energy and intermittent episodes of exertional shortness of breath. He does not get short of breath with ordinary activities around the house and he has never had any resting shortness of breath or chest discomfort. He has chronic history of lower extremity edema that is at least partially related to degenerative arthritis involving both knees with previous bilateral knee surgery.  He was seen in follow-up recently by Dr. Burt Knack following the onset of symptoms of exertional shortness of breath. He underwent repeat diagnostic cardiac catheterization on 05/12/2016 revealing severe stenosis of the mid right coronary artery with heavy calcification, unchanged from the last previous diagnostic catheterization performed in 2014. There remained only minor nonobstructive coronary artery disease in the left coronary circulation. There was severe aortic stenosis with mean transvalvular gradient measured 35 mmHg by catheterization. The patient subsequently underwent follow-up echocardiogram 05/18/2016 which confirmed the presence of severe aortic stenosis with normal left ventricular systolic function. The patient subsequently underwent CT angiography and has been referred for elective surgical consultation.  The patient is married and lives with his wife locally in [place]. Despite his advanced age he has remained remarkably active. 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 Up until recently he has continued to enjoy playing golf, walking on a regular basis, and gardening.  He has only recently developed symptoms of exertional shortness of breath and fatigue as noted previously. He reports no other significant physical limitations.  Past Medical History:  Diagnosis Date  . AAA (abdominal aortic aneurysm) ([place])    s/p repair  . Aortic stenosis, mild    a. mean gradient 17 mmHg on 04/2012 TTE  . Arthritis    knees  . CAD (coronary artery disease)  a.  s/p multiple percutaneous prior coronary artery interventions b. 05/2012: unsuccessful PCI to tight RCA->medically managed  . Carotid artery disease ([facility])    0-39% bilateral ICA stenoses 11/2011  . Chronic anticoagulation    Coumadin  . GERD (gastroesophageal reflux disease)   . GERD (gastroesophageal reflux disease)   . History of pulmonary embolism    1964 related to dislocated hip on right   . History of skin cancer   . Hypercholesteremia   . Hypertension   . Hypertension   . Microscopic hematuria    Followed by urology  . Permanent atrial fibrillation ([place])   . PVD (peripheral vascular disease) ([place])    Bilateral renal artery atherosclerosis, SMA stenosis with collateralization  . RBBB    Chronic  . SSS (sick sinus syndrome) [facility])     Past Surgical History:  Procedure Laterality Date  . ABDOMINAL AORTIC ANEURYSM REPAIR     1993   . CARDIAC CATHETERIZATION  05/24/2012   pD1 20%, oCFX 20%, mCFX 30%, ostial Int Br 30%, distal AV groove CFX 30%, pRCA 30-40%, mRCA 99%, dRCA 30%, PL branch small with diffuse 40%. Unable to pass wire past RCA lesion->medically managed  . CHOLECYSTECTOMY N/A 12/05/2012   Procedure: LAPAROSCOPIC CHOLECYSTECTOMY ;  Surgeon: Edward Jolly, MD;  Location: [facility];  Service: General;  Laterality: N/A;  . [place]  . EYE SURGERY     hx of cataract surgery   . [place]  . HERNIA REPAIR     right inguinal hernia repair   . JOINT REPLACEMENT     left knee replacement, partial right  . LEFT HEART CATHETERIZATION WITH CORONARY ANGIOGRAM N/A 05/24/2012   Procedure: LEFT HEART CATHETERIZATION WITH CORONARY ANGIOGRAM;  Surgeon: Burnell Blanks, MD;  Location: [facility] CATH LAB;  Service: Cardiovascular;  Laterality: N/A;  . ORTHOPEDIC SURGERY    . OTHER SURGICAL HISTORY     right knee popliteal aneurysm surgery stent placed   . OTHER SURGICAL HISTORY    . PARTIAL KNEE ARTHROPLASTY  01/22/2012   Procedure:  UNICOMPARTMENTAL KNEE;  Surgeon: Mauri Pole, MD;  Location: [facility] OR[facility];  Service: Orthopedics;  Laterality: Right;  . PERCUTANEOUS CORONARY INTERVENTION-BALLOON ONLY  05/24/2012   Procedure: PERCUTANEOUS CORONARY INTERVENTION-BALLOON ONLY;  Surgeon: Burnell Blanks, MD;  Location: [facility] CATH LAB;  Service: Cardiovascular;;  . RIGHT/LEFT HEART CATH AND CORONARY ANGIOGRAPHY N/A 05/12/2016   Procedure: Right/Left Heart Cath and Coronary Angiography;  Surgeon: Sherren Mocha, MD;  Location: [facility] CV LAB;  Service: Cardiovascular;  Laterality: N/A;  . TONSILLECTOMY      Family History  Problem Relation Age of Onset  . Heart attack Mother 62    Social History   Social History  . Marital status: Married    Spouse name: N/A  . Number of children: N/A  . Years of education: N/A   Occupational History  . Not on file.   Social History Main Topics  . Smoking status: Former Smoker    Types: Cigarettes    Quit date: 02/14/1983  . Smokeless tobacco: Never Used  . Alcohol use 4.2 oz/week    7 Glasses of wine per week     Comment: 5 ounces of wine daily   . Drug use: No  . Sexual activity: Not on file   Other Topics Concern  . Not on file   Social History Narrative  . No narrative on file  Current Outpatient Prescriptions  Medication Sig Dispense Refill  . amLODipine (NORVASC) 5 MG tablet Take 5 mg by mouth daily.    Marland Kitchen atorvastatin (LIPITOR) 20 MG tablet TAKE 1 TABLET ONCE DAILY. 30 tablet 11  . Calcium-Magnesium-Zinc (CAL-MAG-ZINC PO) Take 1 tablet by mouth daily.    . Cholecalciferol (VITAMIN D-3) 5000 UNITS TABS Take 5,000 Units by mouth daily.     . Coenzyme Q10 (CO Q 10) 100 MG CAPS Take 1 capsule by mouth every morning.     . hydrochlorothiazide (MICROZIDE) 12.5 MG capsule TAKE (1) CAPSULE DAILY. 30 capsule 11  . ibuprofen (ADVIL,MOTRIN) 200 MG tablet Take 400 mg by mouth every 6 (six) hours as needed for pain.    . isosorbide mononitrate (IMDUR) 60 MG 24 hr tablet  TAKE 1 TABLET EACH DAY. 30 tablet 11  . LORazepam (ATIVAN) 1 MG tablet Take 1 mg by mouth at bedtime.     . metoprolol succinate (TOPROL-XL) 50 MG 24 hr tablet Take 50 mg by mouth daily. Take with or immediately following a meal.     . Multiple Vitamins-Minerals (PRESERVISION AREDS) CAPS Take 1 capsule by mouth daily.    . nitroGLYCERIN (NITROSTAT) 0.4 MG SL tablet Place 1 tablet (0.4 mg total) under the tongue every 5 (five) minutes x 3 doses as needed for chest pain. 30 tablet 4  . omega-3 acid ethyl esters (LOVAZA) 1 G capsule Take 1 g by mouth daily.    . ramipril (ALTACE) 10 MG capsule Take 10 mg by mouth 2 (two) times daily.    . Ranibizumab (LUCENTIS) 0.5 MG/0.05ML SOLN Inject 0.5 mg into the eye See admin instructions. Pt gets this injection every 9 weeks. Next treatment is due on 01-18-12. Pt is followed by [facility].  Gets 1 shot at a time, one week apart.    . vitamin B-12 (CYANOCOBALAMIN) 1000 MCG tablet Take 1,000 mcg by mouth daily.    Marland Kitchen warfarin (COUMADIN) 5 MG tablet Take 5-7.5 mg by mouth See admin instructions. 7.5 mg on Mondays. 5 mg all other days     No current facility-administered medications for this visit.     No Known Allergies    Review of Systems:   General:  normal appetite, decreased energy, no weight gain, no weight loss, no fever  Cardiac:  no chest pain with exertion, no chest pain at rest, + SOB with exertion, no resting SOB, no PND, no orthopnea, no palpitations, + arrhythmia, + atrial fibrillation, + LE edema, no dizzy spells, no syncope  Respiratory:  + exertional shortness of breath, no home oxygen, no productive cough, no dry cough, no bronchitis, no wheezing, no hemoptysis, no asthma, no pain with inspiration or cough, no sleep apnea, no CPAP at night  GI:   no difficulty swallowing, + reflux, no frequent heartburn, no hiatal hernia, no abdominal pain, + constipation, no diarrhea, no hematochezia, no hematemesis, no melena  GU:   no  dysuria,  no frequency, no urinary tract infection, no hematuria, no enlarged prostate, + kidney stones, no kidney disease  Vascular:  no pain suggestive of claudication, no pain in feet, no leg cramps, + varicose veins, no DVT, no non-healing foot ulcer  Neuro:   no stroke, no TIA's, no seizures, no headaches, no temporary blindness one eye,  no slurred speech, no peripheral neuropathy, no chronic pain, no instability of gait, no memory/cognitive dysfunction  Musculoskeletal: mild arthritis, no joint swelling, no myalgias, no difficulty walking, normal mobility  Skin:   no rash, no itching, no skin infections, no pressure sores or ulcerations  Psych:   no anxiety, no depression, no nervousness, no unusual recent stress  Eyes:   no blurry vision, no floaters, no recent vision changes, + wears glasses or contacts  ENT:   no hearing loss, no loose or painful teeth, no dentures, last saw dentist January 2018  Hematologic:  + easy bruising, no abnormal bleeding, no clotting disorder, no frequent epistaxis  Endocrine:  no diabetes, does not check CBG's at home           Physical Exam:   BP (!) 149/79 (BP Location: Right Arm, Patient Position: Sitting, Cuff Size: Large)   Pulse (!) 56   Resp 16   Ht 5\' 9"  (1.753 m)   Wt 191 lb (86.6 kg)   SpO2 96% Comment: ON RA  BMI 28.21 kg/m   General:    well-appearing, looks younger than stated age  77:  Unremarkable   Neck:   no JVD, no bruits, no adenopathy   Chest:   clear to auscultation, symmetrical breath sounds, no wheezes, no rhonchi   CV:   RRR, grade IV/VI crescendo/decrescendo murmur heard best at RSB,  no diastolic murmur  Abdomen:  soft, non-tender, no masses   Extremities:  warm, well-perfused, pulses palpable, no LE edema  Rectal/GU  Deferred  Neuro:   Grossly non-focal and symmetrical throughout  Skin:   Clean and dry, no rashes, no breakdown   Diagnostic Tests:  Right/Left Heart Cath and Coronary Angiography  Conclusion    1. Severe RCA stenosis with heavy calcification unchanged from 2014 study 2. Minor nonobstructive LAD and LCx stenosis 3. Severely calcified aortic valve with severe aortic stenosis (mean gradient 35 mmHg and AVA 1.06 square cm)  Recommend: Further evaluation by the multidisciplinary heart team for consideration of TAVR/PCI or other treatment options. Will obtain updated echo and CTA studies.  Indications   Severe aortic stenosis [I35.0 (ICD-10-CM)]  Procedural Details/Technique   Technical Details INDICATION: Stage D, symptomatic aortic stenosis. 83 yo male with known CAD, failed attempt at RCA PCI in 2014, has developed progressive dyspnea and found to have moderate/severe aortic stenosis. Presents for R/L heart catheterization for further evaluation.  PROCEDURAL DETAILS: There was an indwelling IV in a right antecubital vein. Using normal sterile technique, the IV was changed out for a 5 Fr brachial sheath over a 0.018 inch wire. The right wrist was then prepped, draped, and anesthetized with 1% lidocaine. I was able to pass a wire into the vein but never could advance a catheter up the arm. Attention was turned to the left groin. A front wall venous puncture was performed and a 7 Fr sheath was advanced. A swan-ganz catheter was used for the right heart catheterization.  Using the modified Seldinger technique a 5/6 French Slender sheath was placed in the left radial artery. Intra-arterial verapamil was administered through the radial artery sheath. IV heparin was administered after a JR4 catheter was advanced into the central aorta. Standard Judkins catheters were used for selective coronary angiography and aortic angiography. An AL-1 catheter and straight wire were used to cross the aortic valve. There were no immediate procedural complications. The patient was transferred to the post catheterization recovery area for further monitoring.    Estimated blood loss <50 mL.  During this  procedure the patient was administered the following to achieve and maintain moderate conscious sedation: Versed 2 mg, Fentanyl 25 mcg, while the patient's  heart rate, blood pressure, and oxygen saturation were continuously monitored.    Coronary Findings   Dominance: Right  Left Anterior Descending  There is mild the vessel.  Mid LAD to Dist LAD lesion, 25% stenosed.  Ramus Intermedius  The vessel exhibits minimal luminal irregularities.  Left Circumflex  There is mild the vessel.  Mid Cx lesion, 30% stenosed.  Right Coronary Artery  Prox RCA to Mid RCA lesion, 40% stenosed. The lesion is severely calcified.  Mid RCA lesion, 95% stenosed. The lesion is severely calcified.  Dist RCA lesion, 40% stenosed. The lesion is moderately calcified.  Right Heart   Right Heart Pressures Hemodynamic findings consistent with aortic valve stenosis. Aortic Valve: mean gradient 35 mmHg and calculated aortic valve area 1.06 square cm    Coronary Diagrams   Diagnostic Diagram       Implants     No implant documentation for this case.  PACS Images   Show images for Cardiac catheterization   Link to Procedure Log   Procedure Log    Hemo Data    Most Recent Value  Fick Cardiac Output 5.81 L/min  Fick Cardiac Output Index 2.89 (L/min)/BSA  Aortic Mean Gradient 34.7 mmHg  Aortic Peak Gradient 34 mmHg  Aortic Valve Area 1.06  Aortic Value Area Index 0.53 cm2/BSA  RA A Wave -99 mmHg  RA V Wave 13 mmHg  RA Mean 12 mmHg  RV Systolic Pressure 51 mmHg  RV Diastolic Pressure 10 mmHg  RV EDP 14 mmHg  PA Systolic Pressure 53 mmHg  PA Diastolic Pressure 24 mmHg  PA Mean 34 mmHg  PW A Wave -99 mmHg  PW V Wave 32 mmHg  PW Mean 22 mmHg  AO Systolic Pressure 371 mmHg  AO Diastolic Pressure 63 mmHg  AO Mean 91 mmHg  LV Systolic Pressure 062 mmHg  LV Diastolic Pressure 12 mmHg  LV EDP 17 mmHg  Arterial Occlusion Pressure Extended Systolic Pressure 694 mmHg  Arterial Occlusion Pressure  Extended Diastolic Pressure 55 mmHg  Arterial Occlusion Pressure Extended Mean Pressure 88 mmHg  Left Ventricular Apex Extended Systolic Pressure 854 mmHg  Left Ventricular Apex Extended Diastolic Pressure 15 mmHg  Left Ventricular Apex Extended EDP Pressure 19 mmHg  QP/QS 1  TPVR Index 11.75 HRUI  TSVR Index 31.46 HRUI  PVR SVR Ratio 0.15  TPVR/TSVR Ratio 0.37      Transthoracic Echocardiography  Patient:    Deep, [place] MR #:       [id number] Study Date: 05/18/2016 Gender:     M Age:        31 Height:     175.3 cm Weight:     84.8 kg BSA:        2.05 m^2 Pt. Status: Room:   [place], [place]    Sherren Mocha, MD  ORDERING     Sherren Mocha, MD  [place], MD  PERFORMING   [facility], Outpatient  SONOGRAPHER  [facility], RDCS  cc:  ------------------------------------------------------------------- LV EF: 55% -   60%  ------------------------------------------------------------------- Indications:      Aortic Stenosis (I35.0).  ------------------------------------------------------------------- History:   PMH:   Atrial fibrillation.  Coronary artery disease. Risk factors:  Sick Sinus Syndrome, Pulmonary Embolism, Peripheral Vascular Disease, Right Bundle Branch Block, Abdominal Aortic Aneurysm s/p Repair  Family history of coronary artery disease. Former tobacco use. Hypertension. Dyslipidemia.  ------------------------------------------------------------------- Study Conclusions  - Left ventricle: The cavity size was  normal. Wall thickness was   normal. Systolic function was normal. The estimated ejection   fraction was in the range of 55% to 60%. Wall motion was normal;   there were no regional wall motion abnormalities. - Aortic valve: There was severe stenosis. Valve area (VTI): 0.88   cm^2. Valve area (Vmax): 0.88 cm^2. Valve area (Vmean): 0.77   cm^2. - Mitral valve: Calcified annulus.  Mildly thickened leaflets .   There was mild regurgitation. Valve area by pressure half-time:   1.93 cm^2. Valve area by continuity equation (using LVOT flow):   3.39 cm^2. - Left atrium: The atrium was severely dilated. - Right atrium: The atrium was moderately to severely dilated. - Pulmonary arteries: Systolic pressure was moderately increased.   PA peak pressure: 60 mm Hg (S).  ------------------------------------------------------------------- Study data:  Comparison was made to the study of 09/30/2015.  Study status:  Routine.  Procedure:  Transthoracic echocardiography. Image quality was adequate.          Transthoracic echocardiography.  M-mode, complete 2D, spectral Doppler, and color Doppler.  Birthdate:  Patient birthdate: 03-Feb-1927.  Age:  Patient is 83 yr old.  Sex:  Gender: male.    BMI: 27.6 kg/m^2.  Blood pressure:     146/76  Patient status:  Outpatient.  Study date: Study date: 05/18/2016. Study time: 08:57 AM.  Location:  [facility]  -------------------------------------------------------------------  ------------------------------------------------------------------- Left ventricle:  The cavity size was normal. Wall thickness was normal. Systolic function was normal. The estimated ejection fraction was in the range of 55% to 60%. Wall motion was normal; there were no regional wall motion abnormalities. The study was not technically sufficient to allow evaluation of LV diastolic dysfunction due to atrial fibrillation.  ------------------------------------------------------------------- Aortic valve:   Probably trileaflet; mildly thickened, mildly calcified leaflets.  Doppler:   There was severe stenosis.   The jet is still early peaking.    VTI ratio of LVOT to aortic valve: 0.2. Valve area (VTI): 0.88 cm^2. Indexed valve area (VTI): 0.43 cm^2/m^2. Peak velocity ratio of LVOT to aortic valve: 0.2. Valve area (Vmax): 0.88 cm^2. Indexed valve area  (Vmax): 0.43 cm^2/m^2. Mean velocity ratio of LVOT to aortic valve: 0.17. Valve area (Vmean): 0.77 cm^2. Indexed valve area (Vmean): 0.38 cm^2/m^2. Mean gradient (S): 32 mm Hg. Peak gradient (S): 59 mm Hg.  ------------------------------------------------------------------- Aorta:  Aortic root: The aortic root was normal in size. Ascending aorta: The ascending aorta was normal in size.  ------------------------------------------------------------------- Mitral valve:   Calcified annulus. Mildly thickened leaflets . Doppler:  There was mild regurgitation.    Valve area by pressure half-time: 1.93 cm^2. Indexed valve area by pressure half-time: 0.94 cm^2/m^2. Valve area by continuity equation (using LVOT flow): 3.39 cm^2. Indexed valve area by continuity equation (using LVOT flow): 1.65 cm^2/m^2.    Mean gradient (D): 2 mm Hg. Peak gradient (D): 6 mm Hg.  ------------------------------------------------------------------- Left atrium:  The atrium was severely dilated.  ------------------------------------------------------------------- Right ventricle:  The cavity size was normal. Systolic function was normal.  ------------------------------------------------------------------- Pulmonic valve:   Poorly visualized.  The valve appears to be grossly normal.    Doppler:  There was mild regurgitation.  ------------------------------------------------------------------- Tricuspid valve:   Structurally normal valve.   Leaflet separation was normal.  Doppler:  Transvalvular velocity was within the normal range. There was mild regurgitation.  ------------------------------------------------------------------- Pulmonary artery:   Systolic pressure was moderately increased.  ------------------------------------------------------------------- Right atrium:  The atrium was moderately to severely  dilated.  -------------------------------------------------------------------  Pericardium:  There was no pericardial effusion.  ------------------------------------------------------------------- Systemic veins: Inferior vena cava: The vessel was dilated. The respirophasic diameter changes were blunted (< 50%), consistent with elevated central venous pressure.  ------------------------------------------------------------------- Measurements   Left ventricle                           Value          Reference  LV ID, ED, PLAX chordal                  43.4  mm       43 - 52  LV ID, ES, PLAX chordal                  24.2  mm       23 - 38  LV fx shortening, PLAX chordal           44    %        >=29  LV PW thickness, ED                      10.8  mm       ----------  IVS/LV PW ratio, ED                      1.14           <=1.3  Stroke volume, 2D                        118   ml       ----------  Stroke volume/bsa, 2D                    58    ml/m^2   ----------  LV e&', lateral                           7.18  cm/s     ----------  LV E/e&', lateral                         17.41          ----------  LV e&', medial                            6.64  cm/s     ----------  LV E/e&', medial                          18.83          ----------  LV e&', average                           6.91  cm/s     ----------  LV E/e&', average                         18.09          ----------    Ventricular septum                       Value          Reference  IVS thickness, ED  12.3  mm       ----------    LVOT                                     Value          Reference  LVOT ID, S                               23.7  mm       ----------  LVOT area                                4.41  cm^2     ----------  LVOT peak velocity, S                    76.15 cm/s     ----------  LVOT mean velocity, S                    45.3  cm/s     ----------  LVOT VTI, S                               20.76 cm       ----------  Stroke volume (SV), LVOT DP              91.6  ml       ----------  Stroke index (SV/bsa), LVOT DP           44.7  ml/m^2   ----------    Aortic valve                             Value          Reference  Aortic valve peak velocity, S            383   cm/s     ----------  Aortic valve mean velocity, S            260   cm/s     ----------  Aortic valve VTI, S                      105   cm       ----------  Aortic mean gradient, S                  32    mm Hg    ----------  Aortic peak gradient, S                  59    mm Hg    ----------  VTI ratio, LVOT/AV                       0.2            ----------  Aortic valve area, VTI                   0.88  cm^2     ----------  Aortic valve area/bsa, VTI               0.43  cm^2/m^2 ----------  Velocity ratio, peak, LVOT/AV  0.2            ----------  Aortic valve area, peak velocity         0.88  cm^2     ----------  Aortic valve area/bsa, peak              0.43  cm^2/m^2 ----------  velocity  Velocity ratio, mean, LVOT/AV            0.17           ----------  Aortic valve area, mean velocity         0.77  cm^2     ----------  Aortic valve area/bsa, mean              0.38  cm^2/m^2 ----------  velocity    Aorta                                    Value          Reference  Aortic root ID, ED                       31    mm       ----------  Ascending aorta ID, A-P, S               35    mm       ----------    Left atrium                              Value          Reference  LA ID, A-P, ES                           54    mm       ----------  LA ID/bsa, A-P                   (H)     2.64  cm/m^2   <=2.2  LA volume, S                             91.5  ml       ----------  LA volume/bsa, S                         44.7  ml/m^2   ----------  LA volume, ES, 1-p A4C                   97.8  ml       ----------  LA volume/bsa, ES, 1-p A4C               47.7  ml/m^2   ----------  LA volume, ES, 1-p A2C                    85.6  ml       ----------  LA volume/bsa, ES, 1-p A2C               41.8  ml/m^2   ----------    Mitral valve  Value          Reference  Mitral E-wave peak velocity              125   cm/s     ----------  Mitral mean velocity, D                  56.7  cm/s     ----------  Mitral deceleration time                 173   ms       150 - 230  Mitral pressure half-time                114   ms       ----------  Mitral mean gradient, D                  2     mm Hg    ----------  Mitral peak gradient, D                  6     mm Hg    ----------  Mitral valve area, PHT, DP               1.93  cm^2     ----------  Mitral valve area/bsa, PHT, DP           0.94  cm^2/m^2 ----------  Mitral valve area, LVOT                  3.39  cm^2     ----------  continuity  Mitral valve area/bsa, LVOT              1.65  cm^2/m^2 ----------  continuity  Mitral annulus VTI, D                    34.7  cm       ----------  Mitral regurg VTI, PISA                  232   cm       ----------    Pulmonary arteries                       Value          Reference  PA pressure, S, DP               (H)     60    mm Hg    <=30    Tricuspid valve                          Value          Reference  Tricuspid regurg peak velocity           337   cm/s     ----------  Tricuspid peak RV-RA gradient            45    mm Hg    ----------    Right atrium                             Value          Reference  RA ID, S-I, ES, A4C              (H)     66  mm       34 - 49  RA area, ES, A4C                 (H)     26.7  cm^2     8.3 - 19.5  RA volume, ES, A/L                       88    ml       ----------  RA volume/bsa, ES, A/L                   42.9  ml/m^2   ----------    Systemic veins                           Value          Reference  Estimated CVP                            15    mm Hg    ----------    Right ventricle                          Value          Reference  TAPSE                                     16    mm       ----------  RV pressure, S, DP               (H)     60    mm Hg    <=30  RV s&', lateral, S                        10.8  cm/s     ----------  Legend: (L)  and  (H)  mark values outside specified reference range.  ------------------------------------------------------------------- Prepared and Electronically Authenticated by  Sanda Klein, MD 2018-04-05T14:47:49   STS Risk Calculator  Procedure    AVR + CABG  Risk of Mortality   2.98% Morbidity or Mortality  17.8% Prolonged LOS   9.4% Short LOS    24.1% Permanent Stroke   1.7% Prolonged Vent Support  10.1% DSW Infection    0.3% Renal Failure    5.0% Reoperation    8.3%   Cardiac TAVR CT  TECHNIQUE: The patient was scanned on a Philips 256 scanner. A 120 kV retrospective scan was triggered in the descending thoracic aorta at 111 HU's. Gantry rotation speed was 270 msecs and collimation was .9 mm. No beta blockade or nitro were given. The 3D data set was reconstructed in 5% intervals of the R-R cycle. Systolic and diastolic phases were analyzed on a dedicated work station using MPR, MIP and VRT modes. The patient received 80 cc of contrast.  FINDINGS: Aortic Valve: Trileaflet, severely thickened and symmetrically severely calcified aortic valve with severely restricted leaflet opening. There are minimal calcifications extending into the LVOT.  Aorta: Normal size, moderate diffuse calcifications with severe atherosclerosis. No dissection.  Sinotubular Junction:  30 x 27 mm  Ascending Thoracic Aorta:  32 x 32 mm  Aortic Arch:  29 x 25 mm  Descending Thoracic Aorta:  28 x 26 mm  Sinus of Valsalva Measurements:  Non-coronary:  33 mm  Right -coronary:  34 mm  Left -coronary:  36 mm  Coronary Artery Height above Annulus:  Left Main:  15 mm  Right Coronary:  20 mm  Virtual Basal Annulus Measurements:  Maximum/Minimum Diameter:  31 x 26 mm  Perimeter:   106 mm  Area:  622 mm2  Optimum Fluoroscopic Angle for Delivery:  LAO5 CAU 5  IMPRESSION: 1. Trileaflet, severely thickened and symmetrically severely calcified aortic valve with severely restricted leaflet opening. There are minimal calcifications extending into the LVOT. Annular measurements suitable for delivery of a 29 mm Edwards-SAPIEN 3 TAVR valve.  2.  Sufficient annulus to coronary distance.  3.  Optimum Fluoroscopic Angle for Delivery:  LAO5 CAU 5.  4. There is a filling defect in the tip of a large left atrial appendage. This might represent lack of contrast or a thrombus. A TEE prior to the procedure is recommended.  Ena Dawley   Electronically Signed   By: Ena Dawley   On: 05/24/2016 08:53   CT ANGIOGRAPHY CHEST, ABDOMEN AND PELVIS  TECHNIQUE: Multidetector CT imaging through the chest, abdomen and pelvis was performed using the standard protocol during bolus administration of intravenous contrast. Multiplanar reconstructed images and MIPs were obtained and reviewed to evaluate the vascular anatomy.  CONTRAST:  75 mL of Isovue 370.  COMPARISON:  CT the abdomen and pelvis 09/27/2013. Chest CT 12/03/2012.  FINDINGS: CTA CHEST FINDINGS  Cardiovascular: Heart size is mildly enlarged with left atrial dilatation. There is no significant pericardial fluid, thickening or pericardial calcification. Large filling defect in the tip of the left atrial appendage, highly concerning for left atrial appendage thrombus. There is aortic atherosclerosis, as well as atherosclerosis of the great vessels of the mediastinum and the coronary arteries, including calcified atherosclerotic plaque in the left main, left anterior descending, left circumflex and right coronary arteries. Severe thickening calcification of the aortic valve.  Mediastinum/Lymph Nodes: No pathologically enlarged mediastinal or hilar lymph nodes. Esophagus is unremarkable in  appearance. No axillary lymphadenopathy.  Lungs/Pleura: Small to moderate right pleural effusion which appears likely partially loculated. Trace left pleural effusion lying dependently. Areas of scarring and/or subsegmental atelectasis are noted in the right lower lobe. Thin-walled cyst in the periphery of the right lower lobe again noted. No suspicious appearing pulmonary nodules or masses are noted. No acute consolidative airspace disease.  Musculoskeletal/Soft Tissues: There are no aggressive appearing lytic or blastic lesions noted in the visualized portions of the skeleton.  CTA ABDOMEN AND PELVIS FINDINGS  Hepatobiliary: Liver has a shrunken appearance and nodular contour, indicative of underlying cirrhosis. 1.3 cm low-attenuation lesion in the inferior aspect of segment 5 is compatible with a simple cyst. Subcentimeter low-attenuation lesion in segment 4A is too small to characterize, but likely a tiny cyst. No other suspicious appearing hepatic lesions are noted. No intra or extrahepatic biliary ductal dilatation. Status post cholecystectomy.  Pancreas: No pancreatic mass. No pancreatic ductal dilatation. No pancreatic or peripancreatic fluid or inflammatory changes.  Spleen: Unremarkable.  Adrenals/Urinary Tract: There are multiple low-attenuation lesions in both kidneys, compatible with simple cysts, measuring up to 4.1 cm in diameter in the anterior aspect of the interpolar region of the left kidney. Other subcentimeter low-attenuation lesions in both kidneys are too small to definitively characterize, but are also favored to represent small cysts. Bilateral adrenal glands are normal in appearance. No hydroureteronephrosis. Urinary bladder is unremarkable in appearance.  Stomach/Bowel:  The appearance of the stomach is normal. There is no pathologic dilatation of small bowel or colon. In the cecum adjacent to the ileocecal valve there is a 4.6 x 5.6 x 3.7 cm  mass-like lesion (axial image 197 of series 401 and coronal image 26 of series 403), which could represent a large polyp or malignant lesion. Several colonic diverticulae are noted, particularly in the descending colon and sigmoid colon, without surrounding inflammatory changes to suggest an acute diverticulitis at this time. Normal appendix.  Vascular/Lymphatic: Vascular findings and measurements pertinent to potential TAVR procedure, as detailed below. Notably, the patient has undergone aorto bi-iliac bypass surgery with an aorto bi-iliac bypass graft which extends from the infrarenal abdominal aorta to the external iliac arteries bilaterally. The graft is widely patent. There are portions of the native external iliac artery use which remain patent and communicate with the remaining pelvic vasculature, which should be noted prior to retrograde placement of catheters at time of attempted TAVR. 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 Celiac axis is widely patent. Inferior mesenteric artery is patent, although the origin and proximal aspect of the vessel appear occluded, with perfusion to the distal aspect of the vessel presumably via collaterals. Mild stenosis in the proximal right renal artery. Left renal artery is widely patent. No lymphadenopathy noted in the abdomen or pelvis.  Reproductive: Prostate gland and seminal vesicles are unremarkable in appearance.  Other: Small left inguinal hernia containing only fat. Small volume of ascites, predominantly in the low anatomic pelvis. No pneumoperitoneum.  Musculoskeletal: There are no aggressive appearing lytic or blastic lesions noted in the visualized portions of the skeleton.  VASCULAR MEASUREMENTS PERTINENT TO TAVR:  AORTA:  Minimal Aortic Diameter -  18 x 18 mm  Severity of Aortic Calcification -  moderate  RIGHT PELVIS:  Right Common Iliac Artery -  Minimal Diameter - 7.9 x  7.1 mm  Tortuosity - mild  Calcification - none (graft)  Right External Iliac Artery -  Minimal Diameter - 9.0 x 6.7 mm  Tortuosity - mild  Calcification - mild to moderate  Right Common Femoral Artery -  Minimal Diameter - 7.2 x 5.7 mm  Tortuosity - mild  Calcification - severe  LEFT PELVIS:  Left Common Iliac Artery -  Minimal Diameter - 8.2 x 6.7 mm  Tortuosity - mild  Calcification - none (graft)  Left External Iliac Artery -  Minimal Diameter - 7.8 x 7.1 mm  Tortuosity - mild  Calcification - mild to moderate  Left Common Femoral Artery -  Minimal Diameter - 6.7 x 6.4 mm  Tortuosity - mild  Calcification - severe  Review of the MIP images confirms the above findings.  IMPRESSION: 1. Vascular findings and measurements pertinent to potential TAVR procedure, as detailed above. The patient does appear to have suitable pelvic arterial access, however, the following features should be noted. Both common femoral arteries are densely calcified, at times circumferentially calcified. Patient has undergone prior aorto bi-iliac bypass graft which extends to the external iliac arteries bilaterally. Portions of the proximal native external iliac arteries are patent, and communicate with the remaining pelvic arterial vasculature. At the time of attempted retrograde catheter placement during potential TAVR, this should be noted. 2. Severe thickening calcification of the aortic valve, compatible with the reported clinical history of aortic stenosis. 3. Cardiomegaly with left atrial dilatation, and a filling defect in the tip of the left atrial appendage highly concerning for left atrial appendage thrombus. This  places the patient at risk for systemic embolization. 4. **An incidental finding of potential clinical significance has been found. 4.6 x 5.6 x 3.7 cm masslike lesion in the cecum adjacent to the ileocecal valve may represent a  large polyp or colonic neoplasm. Correlation with nonemergent colonoscopy is strongly recommended in the near future to better evaluate this finding.** 5. 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 6. Small to moderate partially loculated right pleural effusion and trace left pleural effusion lying dependently. 7. Cirrhosis with small volume of ascites. 8. Colonic diverticulosis without evidence to suggest acute diverticulitis at this time. 9. Additional incidental findings, as above.   Electronically Signed   By: Vinnie Langton M.D.   On: 05/24/2016 11:45    Impression:  Patient has stage D severe symptomatic aortic stenosis and single-vessel coronary artery disease. He presents with recent onset of symptoms of exertional shortness of breath and fatigue consistent with chronic diastolic congestive heart failure, [place] Heart Association functional class II. I personally reviewed the patient's recent transthoracic echocardiogram and diagnostic cardiac catheterization.  Transesophageal echocardiogram reveals severe thickening, calcification, and restricted leaflet mobility involving all 3 leaflets of the patient's aortic valve. Peak velocity across the aortic valve very somewhat because of the patient's underlying chronic atrial fibrillation but ranges between 3.8 and 4.2 m/s corresponding to mean transvalvular gradient estimated greater than 45 mmHg.  The DVI was quite low at 0.17 and left ventricular systolic function remains normal.  Diagnostic cardiac catheterization is notable for the presence of severe single vessel coronary artery disease involving high-grade obstruction of mid right coronary artery. This is completely unchanged in comparison with the patient's last diagnostic catheterization performed in 2014, and the patient denies any symptoms of substernal chest pain or chest tightness either with activity or at rest. Options include conventional  surgical aortic valve replacement with coronary artery bypass grafting versus transcatheter aortic valve replacement with continued medical therapy of the patient's underlying coronary artery disease. Risks associated with conventional surgery would be moderately elevated because of the patient's advanced age. CT angiography reveals findings consistent with severe aortic stenosis with anatomical characteristics suitable for transcatheter aortic valve replacement without any significant complicating features.  CT angiography of the abdominal aorta and iliac vessels reveals what appears to be adequate pelvic vascular access to facilitate a transfemoral approach for TAVR, although the patient does have a patent aortobifemoral graft from previous surgical repair of abdominal aortic aneurysm.    Plan:  The patient and his family were counseled at length regarding treatment alternatives for management of severe symptomatic aortic stenosis and coronary artery disease.  The natural history of aortic stenosis and long-term prognosis with medical therapy were discussed.  Alternative approaches such as conventional surgical aortic valve replacement with CABG, transcatheter aortic valve replacement with continued medical therapy for management of his coronary artery disease, and palliative medical therapy were compared and contrasted at length. This discussion was placed in the context of the patient's own specific clinical presentation and past medical history.  All of their questions have been addressed.  The patient is interested in proceeding with transcatheter aortic valve replacement in the near future.  Following the decision to proceed with transcatheter aortic valve replacement, a discussion has been held regarding what types of management strategies would be attempted intraoperatively in the event of life-threatening complications, including whether or not the patient would be considered a candidate for the  use of cardiopulmonary bypass and/or conversion to open sternotomy for  attempted surgical intervention.  The patient specifically requests that he should be considered a candidate for emergency median sternotomy and the development of complications related to tablet that might be salvageable with open surgery. The patient has been advised of a variety of complications that might develop including but not limited to risks of death, stroke, paravalvular leak, aortic dissection or other major vascular complications, aortic annulus rupture, device embolization, cardiac rupture or perforation, mitral regurgitation, acute myocardial infarction, arrhythmia, heart block or bradycardia requiring permanent pacemaker placement, congestive heart failure, respiratory failure, renal failure, pneumonia, infection, other late complications related to structural valve deterioration or migration, or other complications that might ultimately cause a temporary or permanent loss of functional independence or other long term morbidity.  The patient provides full informed consent for the procedure as described and all questions were answered.  We plan to proceed with transcatheter aortic valve replacement via percutaneous transfemoral approach on 06/13/2016. The patient will be referred for a second surgical opinion prior to surgery.  He has been instructed to stop taking warfarin 5 days prior to surgery. He will otherwise remain on all other medications until the morning of surgery.     Valentina Gu. Roxy Manns, MD 05/30/2016 5:15 PM

## 2016-06-13 NOTE — Anesthesia Preprocedure Evaluation (Addendum)
Anesthesia Evaluation  Patient identified by MRN, date of birth, ID band Patient awake    Reviewed: Allergy & Precautions, NPO status , Patient's Chart, lab work & pertinent test results  Airway Mallampati: I  TM Distance: >3 FB Neck ROM: Full    Dental  (+) Teeth Intact, Caps, Dental Advisory Given   Pulmonary former smoker,     + decreased breath sounds      Cardiovascular hypertension, + CAD and + Peripheral Vascular Disease  + dysrhythmias Atrial Fibrillation + Valvular Problems/Murmurs AS  Rhythm:Irregular Rate:Normal + Systolic murmurs    Neuro/Psych    GI/Hepatic GERD  ,  Endo/Other    Renal/GU      Musculoskeletal   Abdominal   Peds  Hematology   Anesthesia Other Findings   Reproductive/Obstetrics                           Anesthesia Physical Anesthesia Plan  ASA: IV  Anesthesia Plan: General   Post-op Pain Management:    Induction: Intravenous  Airway Management Planned: Oral ETT  Additional Equipment: Arterial line and CVP  Intra-op Plan:   Post-operative Plan: Extubation in OR  Informed Consent: I have reviewed the patients History and Physical, chart, labs and discussed the procedure including the risks, benefits and alternatives for the proposed anesthesia with the patient or authorized representative who has indicated his/her understanding and acceptance.   Dental advisory given  Plan Discussed with: CRNA  Anesthesia Plan Comments:         Anesthesia Quick Evaluation

## 2016-06-13 NOTE — Transfer of Care (Signed)
Immediate Anesthesia Transfer of Care Note  Patient: Tyler Williams  Procedure(s) Performed: Procedure(s): TRANSCATHETER AORTIC VALVE REPLACEMENT, TRANSFEMORAL (N/A) TRANSESOPHAGEAL ECHOCARDIOGRAM (TEE) (N/A)  Patient Location: SICU  Anesthesia Type:General  Level of Consciousness: awake  Airway & Oxygen Therapy: Patient Spontanous Breathing and Patient connected to face mask oxygen  Post-op Assessment: Report given to RN  Post vital signs: Reviewed and stable  Last Vitals:  Vitals:   06/13/16 1519 06/13/16 1524  Pulse: (!) 122 (!) 134    Last Pain: There were no vitals filed for this visit.       Complications: No apparent anesthesia complications

## 2016-06-13 NOTE — Interval H&P Note (Signed)
History and Physical Interval Note:  06/13/2016 1:11 PM  Tyler Williams  has presented today for surgery, with the diagnosis of SEVERE AS  The various methods of treatment have been discussed with the patient and family. After consideration of risks, benefits and other options for treatment, the patient has consented to  Procedure(s): TRANSCATHETER AORTIC VALVE REPLACEMENT, TRANSFEMORAL (N/A) TRANSESOPHAGEAL ECHOCARDIOGRAM (TEE) (N/A) as a surgical intervention .  The patient's history has been reviewed, patient examined, no change in status, stable for surgery.  I have reviewed the patient's chart and labs.  Questions were answered to the patient's satisfaction.     Rexene Alberts

## 2016-06-13 NOTE — Progress Notes (Signed)
  Echocardiogram Echocardiogram Transesophageal has been performed.  Zeev Deakins L Androw 06/13/2016, 4:04 PM

## 2016-06-13 NOTE — H&P (View-Only) (Signed)
Patient ID: Tyler Williams, male   DOB: Jun 19, 1926, 83 y.o.   MRN: [id number]  [facility] CONSULTATION REPORT  Referring Provider is Sherren Mocha, MD PCP is Geoffery Lyons, MD  Chief Complaint  Patient presents with  . Aortic Stenosis    2ND TAVR EVAL...scheduled for 06/13/16    HPI:  The patient is an 83 year old gentleman with a history of hypertension, hyperlipidemia, CAD s/p multiple PCI's and known high grade RCA stenosis that could not be opened, persistent atrial fibrillation on Coumadin, and known aortic stenosis that has been followed with echo. Serial echocardiograms have documented a gradual progression in the severity of his aortic stenosis. He recently developed fairly sudden onset of exertional shortness of breath without chest discomfort. Recently when on a walk at [employer] he experienced a prolonged episode of shortness of breath that developed when he was trying to walk up a hill. He recovered fairly quickly with rest but since then he has noticed decreased energy and intermittent episodes of exertional shortness of breath. He does not get short of breath with ordinary activities around the house and he has never had any resting shortness of breath or chest discomfort. His most recent echo on 05/18/2016 showed a mean gradient of 32 mm Hg and a peak of 59 mm Hg. His DI was 0.2 and the valve area 0.88 cm2. LVEF was normal. Cardiac cath on 05/12/2016 showed a 95% mid RCA stenosis with a heavily calcified vessel that was unchanged from 2014. There were minor non-obstructive stenoses in the LAD and LCX. The mean gradient was 35 mm Hg and the AVA calculated at 1.06 cm2.  He is married and lives with his wife of 13 years who is in fair health. He has been very active until recently due to exertional fatigue and shortness of breath. He has had bilateral knee replacements in the past and has swelling  related to that and due to varicose veins but has continued to walk on a regular basis.   Past Medical History:  Diagnosis Date  . AAA (abdominal aortic aneurysm) ([place])    s/p repair  . Aortic stenosis, mild    a. mean gradient 17 mmHg on 04/2012 TTE  . Arthritis    knees  . CAD (coronary artery disease)    a. s/p multiple percutaneous prior coronary artery interventions b. 05/2012: unsuccessful PCI to tight RCA->medically managed  . Carotid artery disease ([facility])    0-39% bilateral ICA stenoses 11/2011  . Chronic anticoagulation    Coumadin  . GERD (gastroesophageal reflux disease)   . GERD (gastroesophageal reflux disease)   . History of pulmonary embolism    1964 related to dislocated hip on right   . History of skin cancer   . Hypercholesteremia   . Hypertension   . Hypertension   . Incidental cecal mass noted on CT imaging 05/24/2016   **An incidental finding of potential clinical significance has been found. 4.6 x 5.6 x 3.7 cm masslike lesion in the cecum adjacent to the ileocecal valve may represent a large polyp or colonic neoplasm. Correlation with nonemergent colonoscopy is strongly recommended in the near future to better evaluate this finding.**  . Microscopic hematuria    Followed by urology  . Permanent atrial fibrillation ([place])   . PVD (peripheral vascular disease) ([place])    Bilateral renal artery atherosclerosis, SMA stenosis with collateralization  . RBBB    Chronic  .  SSS (sick sinus syndrome) [facility])     Past Surgical History:  Procedure Laterality Date  . ABDOMINAL AORTIC ANEURYSM REPAIR     1993   . CARDIAC CATHETERIZATION  05/24/2012   pD1 20%, oCFX 20%, mCFX 30%, ostial Int Br 30%, distal AV groove CFX 30%, pRCA 30-40%, mRCA 99%, dRCA 30%, PL branch small with diffuse 40%. Unable to pass wire past RCA lesion->medically managed  . CHOLECYSTECTOMY N/A 12/05/2012   Procedure: LAPAROSCOPIC CHOLECYSTECTOMY ;  Surgeon: Edward Jolly, MD;  Location: [facility];   Service: General;  Laterality: N/A;  . [place]  . EYE SURGERY     hx of cataract surgery   . [place]  . HERNIA REPAIR     right inguinal hernia repair   . JOINT REPLACEMENT     left knee replacement, partial right  . LEFT HEART CATHETERIZATION WITH CORONARY ANGIOGRAM N/A 05/24/2012   Procedure: LEFT HEART CATHETERIZATION WITH CORONARY ANGIOGRAM;  Surgeon: Burnell Blanks, MD;  Location: [facility] CATH LAB;  Service: Cardiovascular;  Laterality: N/A;  . ORTHOPEDIC SURGERY    . OTHER SURGICAL HISTORY     right knee popliteal aneurysm surgery stent placed   . OTHER SURGICAL HISTORY    . PARTIAL KNEE ARTHROPLASTY  01/22/2012   Procedure: UNICOMPARTMENTAL KNEE;  Surgeon: Mauri Pole, MD;  Location: [facility] ORS;  Service: Orthopedics;  Laterality: Right;  . PERCUTANEOUS CORONARY INTERVENTION-BALLOON ONLY  05/24/2012   Procedure: PERCUTANEOUS CORONARY INTERVENTION-BALLOON ONLY;  Surgeon: Burnell Blanks, MD;  Location: [facility] CATH LAB;  Service: Cardiovascular;;  . RIGHT/LEFT HEART CATH AND CORONARY ANGIOGRAPHY N/A 05/12/2016   Procedure: Right/Left Heart Cath and Coronary Angiography;  Surgeon: Sherren Mocha, MD;  Location: [facility] CV LAB;  Service: Cardiovascular;  Laterality: N/A;  . TONSILLECTOMY      Family History  Problem Relation Age of Onset  . Heart attack Mother 78    Social History   Social History  . Marital status: Married    Spouse name: N/A  . Number of children: N/A  . Years of education: N/A   Occupational History  . Not on file.   Social History Main Topics  . Smoking status: Former Smoker    Types: Cigarettes    Quit date: 02/14/1983  . Smokeless tobacco: Never Used  . Alcohol use 4.2 oz/week    7 Glasses of wine per week     Comment: 5 ounces of wine daily   . Drug use: No  . Sexual activity: Not on file   Other Topics Concern  . Not on file   Social History Narrative  . No narrative on file     Current Outpatient Prescriptions  Medication Sig Dispense Refill  . amLODipine (NORVASC) 5 MG tablet Take 5 mg by mouth daily.    Marland Kitchen atorvastatin (LIPITOR) 20 MG tablet TAKE 1 TABLET ONCE DAILY. 30 tablet 11  . Calcium-Magnesium-Zinc (CAL-MAG-ZINC PO) Take 1 tablet by mouth daily.    . Cholecalciferol (VITAMIN D-3) 5000 UNITS TABS Take 5,000 Units by mouth daily.     . Coenzyme Q10 (CO Q 10) 100 MG CAPS Take 1 capsule by mouth every morning.     . hydrochlorothiazide (MICROZIDE) 12.5 MG capsule TAKE (1) CAPSULE DAILY. 30 capsule 11  . ibuprofen (ADVIL,MOTRIN) 200 MG tablet Take 400 mg by mouth every 6 (six) hours as needed for pain.    . isosorbide mononitrate (IMDUR) 60 MG 24  hr tablet TAKE 1 TABLET EACH DAY. 30 tablet 11  . LORazepam (ATIVAN) 1 MG tablet Take 1 mg by mouth at bedtime.     . metoprolol succinate (TOPROL-XL) 50 MG 24 hr tablet Take 50 mg by mouth daily. Take with or immediately following a meal.     . Multiple Vitamins-Minerals (PRESERVISION AREDS) CAPS Take 1 capsule by mouth daily.    . nitroGLYCERIN (NITROSTAT) 0.4 MG SL tablet Place 1 tablet (0.4 mg total) under the tongue every 5 (five) minutes x 3 doses as needed for chest pain. 30 tablet 4  . omega-3 acid ethyl esters (LOVAZA) 1 G capsule Take 1 g by mouth daily.    . ramipril (ALTACE) 10 MG capsule Take 10 mg by mouth 2 (two) times daily.    . Ranibizumab (LUCENTIS) 0.5 MG/0.05ML SOLN Inject 0.5 mg into the eye See admin instructions. Pt gets this injection every 9 weeks. Next treatment is due on 01-18-12. Pt is followed by [facility].  Gets 1 shot at a time, one week apart.    . vitamin B-12 (CYANOCOBALAMIN) 1000 MCG tablet Take 1,000 mcg by mouth daily.    Marland Kitchen warfarin (COUMADIN) 5 MG tablet Take 5-7.5 mg by mouth See admin instructions. 7.5 mg on Mondays. 5 mg all other days     No current facility-administered medications for this visit.     No Known Allergies    Review of Systems:  General:                       normal appetite, decreased energy, no weight gain, no weight loss, no fever             Cardiac:                       no chest pain with exertion, no chest pain at rest, + SOB with exertion, no resting SOB, no PND, no orthopnea, no palpitations, + arrhythmia, + atrial fibrillation, + LE edema, no dizzy spells, no syncope             Respiratory:                 + exertional shortness of breath, no home oxygen, no productive cough, no dry cough, no bronchitis, no wheezing, no hemoptysis, no asthma, no pain with inspiration or cough, no sleep apnea, no CPAP at night             GI:                               no difficulty swallowing, + reflux, no frequent heartburn, no hiatal hernia, no abdominal pain, + constipation, no diarrhea, no hematochezia, no hematemesis, no melena             GU:                              no dysuria,  no frequency, no urinary tract infection, no hematuria, no enlarged prostate, + kidney stones, no kidney disease             Vascular:                     no pain suggestive of claudication, no pain in feet, no leg cramps, + varicose veins, no DVT, no non-healing  foot ulcer             Neuro:                         no stroke, no TIA's, no seizures, no headaches, no temporary blindness one eye,  no slurred speech, no peripheral neuropathy, no chronic pain, no instability of gait, no memory/cognitive dysfunction             Musculoskeletal:         mild arthritis, no joint swelling, no myalgias, no difficulty walking, normal mobility              Skin:                            no rash, no itching, no skin infections, no pressure sores or ulcerations             Psych:                         no anxiety, no depression, no nervousness, no unusual recent stress             Eyes:                           no blurry vision, no floaters, no recent vision changes, + wears glasses or contacts             ENT:                            no hearing loss, no loose or  painful teeth, no dentures, last saw dentist January 2018             Hematologic:               + easy bruising, no abnormal bleeding, no clotting disorder, no frequent epistaxis             Endocrine:                   no diabetes, does not check CBG's at home                                 Physical Exam:   BP (!) 163/80 (BP Location: Left Arm, Patient Position: Sitting, Cuff Size: Large)   Pulse 61   Resp 16   Ht 5\' 9"  (1.753 m)   Wt 191 lb (86.6 kg)   SpO2 95% Comment: ON RA  BMI 28.21 kg/m   General:  Elderly but  well-appearing  HEENT:  Unremarkable, NCAT, PERLA, EOMI, oropharynx clear. Teeth in fair condition.  Neck:   no JVD, no bruits, no adenopathy or thyromegaly  Chest:   clear to auscultation, symmetrical breath sounds, no wheezes, no rhonchi   CV:   RRR, grade III/VI crescendo/decrescendo murmur heard best at RSB,  no diastolic murmur  Abdomen:  soft, non-tender, no masses or organomegaly  Extremities:  warm, well-perfused, pulses palpable in feet, no LE edema  Rectal/GU  Deferred  Neuro:   Grossly non-focal and symmetrical throughout  Skin:   Clean and dry, no rashes, no breakdown   Diagnostic Tests:         [facility]  [address] [place], [place] [address]                            [phone]  ------------------------------------------------------------------- Transthoracic Echocardiography  Patient:    Tyler Williams, Tyler Williams MR #:       [id number] Study Date: 05/18/2016 Gender:     M Age:        9 Height:     175.3 cm Weight:     84.8 kg BSA:        2.05 m^2 Pt. Status: Room:   [place], [place]    Sherren Mocha, MD  ORDERING     Sherren Mocha, MD  [place], MD  PERFORMING   [facility], Outpatient  SONOGRAPHER  [facility], RDCS  cc:  ------------------------------------------------------------------- LV EF: 55% -    60%  ------------------------------------------------------------------- Indications:      Aortic Stenosis (I35.0).  ------------------------------------------------------------------- History:   PMH:   Atrial fibrillation.  Coronary artery disease. Risk factors:  Sick Sinus Syndrome, Pulmonary Embolism, Peripheral Vascular Disease, Right Bundle Branch Block, Abdominal Aortic Aneurysm s/p Repair  Family history of coronary artery disease. Former tobacco use. Hypertension. Dyslipidemia.  ------------------------------------------------------------------- Study Conclusions  - Left ventricle: The cavity size was normal. Wall thickness was   normal. Systolic function was normal. The estimated ejection   fraction was in the range of 55% to 60%. Wall motion was normal;   there were no regional wall motion abnormalities. - Aortic valve: There was severe stenosis. Valve area (VTI): 0.88   cm^2. Valve area (Vmax): 0.88 cm^2. Valve area (Vmean): 0.77   cm^2. - Mitral valve: Calcified annulus. Mildly thickened leaflets .   There was mild regurgitation. Valve area by pressure half-time:   1.93 cm^2. Valve area by continuity equation (using LVOT flow):   3.39 cm^2. - Left atrium: The atrium was severely dilated. - Right atrium: The atrium was moderately to severely dilated. - Pulmonary arteries: Systolic pressure was moderately increased.   PA peak pressure: 60 mm Hg (S).  ------------------------------------------------------------------- Study data:  Comparison was made to the study of 09/30/2015.  Study status:  Routine.  Procedure:  Transthoracic echocardiography. Image quality was adequate.          Transthoracic echocardiography.  M-mode, complete 2D, spectral Doppler, and color Doppler.  Birthdate:  Patient birthdate: 1926/10/23.  Age:  Patient is 83 yr old.  Sex:  Gender: male.    BMI: 27.6 kg/m^2.  Blood pressure:     146/76  Patient status:  Outpatient.  Study date: Study  date: 05/18/2016. Study time: 08:57 AM.  Location:  [facility]  -------------------------------------------------------------------  ------------------------------------------------------------------- Left ventricle:  The cavity size was normal. Wall thickness was normal. Systolic function was normal. The estimated ejection fraction was in the range of 55% to 60%. Wall motion was normal; there were no regional wall motion abnormalities. The study was not technically sufficient to allow evaluation of LV diastolic dysfunction due to atrial fibrillation.  ------------------------------------------------------------------- Aortic valve:   Probably trileaflet; mildly thickened, mildly calcified leaflets.  Doppler:   There was severe stenosis.   The jet is still early peaking.    VTI ratio of LVOT to aortic valve: 0.2. Valve area (VTI): 0.88 cm^2. Indexed valve area (VTI): 0.43 cm^2/m^2.  Peak velocity ratio of LVOT to aortic valve: 0.2. Valve area (Vmax): 0.88 cm^2. Indexed valve area (Vmax): 0.43 cm^2/m^2. Mean velocity ratio of LVOT to aortic valve: 0.17. Valve area (Vmean): 0.77 cm^2. Indexed valve area (Vmean): 0.38 cm^2/m^2. Mean gradient (S): 32 mm Hg. Peak gradient (S): 59 mm Hg.  ------------------------------------------------------------------- Aorta:  Aortic root: The aortic root was normal in size. Ascending aorta: The ascending aorta was normal in size.  ------------------------------------------------------------------- Mitral valve:   Calcified annulus. Mildly thickened leaflets . Doppler:  There was mild regurgitation.    Valve area by pressure half-time: 1.93 cm^2. Indexed valve area by pressure half-time: 0.94 cm^2/m^2. Valve area by continuity equation (using LVOT flow): 3.39 cm^2. Indexed valve area by continuity equation (using LVOT flow): 1.65 cm^2/m^2.    Mean gradient (D): 2 mm Hg. Peak gradient (D): 6 mm  Hg.  ------------------------------------------------------------------- Left atrium:  The atrium was severely dilated.  ------------------------------------------------------------------- Right ventricle:  The cavity size was normal. Systolic function was normal.  ------------------------------------------------------------------- Pulmonic valve:   Poorly visualized.  The valve appears to be grossly normal.    Doppler:  There was mild regurgitation.  ------------------------------------------------------------------- Tricuspid valve:   Structurally normal valve.   Leaflet separation was normal.  Doppler:  Transvalvular velocity was within the normal range. There was mild regurgitation.  ------------------------------------------------------------------- Pulmonary artery:   Systolic pressure was moderately increased.  ------------------------------------------------------------------- Right atrium:  The atrium was moderately to severely dilated.  ------------------------------------------------------------------- Pericardium:  There was no pericardial effusion.  ------------------------------------------------------------------- Systemic veins: Inferior vena cava: The vessel was dilated. The respirophasic diameter changes were blunted (< 50%), consistent with elevated central venous pressure.  ------------------------------------------------------------------- Measurements   Left ventricle                           Value          Reference  LV ID, ED, PLAX chordal                  43.4  mm       43 - 52  LV ID, ES, PLAX chordal                  24.2  mm       23 - 38  LV fx shortening, PLAX chordal           44    %        >=29  LV PW thickness, ED                      10.8  mm       ----------  IVS/LV PW ratio, ED                      1.14           <=1.3  Stroke volume, 2D                        118   ml       ----------  Stroke volume/bsa, 2D                     58    ml/m^2   ----------  LV e&', lateral                           7.18  cm/s     ----------  LV E/e&', lateral                         17.41          ----------  LV e&', medial                            6.64  cm/s     ----------  LV E/e&', medial                          18.83          ----------  LV e&', average                           6.91  cm/s     ----------  LV E/e&', average                         18.09          ----------    Ventricular septum                       Value          Reference  IVS thickness, ED                        12.3  mm       ----------    LVOT                                     Value          Reference  LVOT ID, S                               23.7  mm       ----------  LVOT area                                4.41  cm^2     ----------  LVOT peak velocity, S                    76.15 cm/s     ----------  LVOT mean velocity, S                    45.3  cm/s     ----------  LVOT VTI, S                              20.76 cm       ----------  Stroke volume (SV), LVOT DP              91.6  ml       ----------  Stroke index (SV/bsa), LVOT DP           44.7  ml/m^2   ----------    Aortic valve  Value          Reference  Aortic valve peak velocity, S            383   cm/s     ----------  Aortic valve mean velocity, S            260   cm/s     ----------  Aortic valve VTI, S                      105   cm       ----------  Aortic mean gradient, S                  32    mm Hg    ----------  Aortic peak gradient, S                  59    mm Hg    ----------  VTI ratio, LVOT/AV                       0.2            ----------  Aortic valve area, VTI                   0.88  cm^2     ----------  Aortic valve area/bsa, VTI               0.43  cm^2/m^2 ----------  Velocity ratio, peak, LVOT/AV            0.2            ----------  Aortic valve area, peak velocity         0.88  cm^2     ----------  Aortic valve area/bsa, peak              0.43   cm^2/m^2 ----------  velocity  Velocity ratio, mean, LVOT/AV            0.17           ----------  Aortic valve area, mean velocity         0.77  cm^2     ----------  Aortic valve area/bsa, mean              0.38  cm^2/m^2 ----------  velocity    Aorta                                    Value          Reference  Aortic root ID, ED                       31    mm       ----------  Ascending aorta ID, A-P, S               35    mm       ----------    Left atrium                              Value          Reference  LA ID, A-P, ES  54    mm       ----------  LA ID/bsa, A-P                   (H)     2.64  cm/m^2   <=2.2  LA volume, S                             91.5  ml       ----------  LA volume/bsa, S                         44.7  ml/m^2   ----------  LA volume, ES, 1-p A4C                   97.8  ml       ----------  LA volume/bsa, ES, 1-p A4C               47.7  ml/m^2   ----------  LA volume, ES, 1-p A2C                   85.6  ml       ----------  LA volume/bsa, ES, 1-p A2C               41.8  ml/m^2   ----------    Mitral valve                             Value          Reference  Mitral E-wave peak velocity              125   cm/s     ----------  Mitral mean velocity, D                  56.7  cm/s     ----------  Mitral deceleration time                 173   ms       150 - 230  Mitral pressure half-time                114   ms       ----------  Mitral mean gradient, D                  2     mm Hg    ----------  Mitral peak gradient, D                  6     mm Hg    ----------  Mitral valve area, PHT, DP               1.93  cm^2     ----------  Mitral valve area/bsa, PHT, DP           0.94  cm^2/m^2 ----------  Mitral valve area, LVOT                  3.39  cm^2     ----------  continuity  Mitral valve area/bsa, LVOT              1.65  cm^2/m^2 ----------  continuity  Mitral annulus VTI, D                    34.7  cm       ----------  Mitral regurg  VTI, PISA                  232   cm       ----------    Pulmonary arteries                       Value          Reference  PA pressure, S, DP               (H)     60    mm Hg    <=30    Tricuspid valve                          Value          Reference  Tricuspid regurg peak velocity           337   cm/s     ----------  Tricuspid peak RV-RA gradient            45    mm Hg    ----------    Right atrium                             Value          Reference  RA ID, S-I, ES, A4C              (H)     66    mm       34 - 49  RA area, ES, A4C                 (H)     26.7  cm^2     8.3 - 19.5  RA volume, ES, A/L                       88    ml       ----------  RA volume/bsa, ES, A/L                   42.9  ml/m^2   ----------    Systemic veins                           Value          Reference  Estimated CVP                            15    mm Hg    ----------    Right ventricle                          Value          Reference  TAPSE                                    16    mm       ----------  RV pressure, S, DP               (H)     60    mm Hg    <=30  RV s&',  lateral, S                        10.8  cm/s     ----------  Legend: (L)  and  (H)  mark values outside specified reference range.  ------------------------------------------------------------------- Prepared and Electronically Authenticated by  Sanda Klein, MD 2018-04-05T14:47:49   [facility]  Cardiac catheterization  Order# [id number]  Reading physician: Sherren Mocha, MD Ordering physician: Sherren Mocha, MD Study date: 05/12/16  Physicians   Panel Physicians Referring Physician Case Authorizing Physician  Sherren Mocha, MD (Primary)    Procedures   Right/Left Heart Cath and Coronary Angiography  Conclusion   1. Severe RCA stenosis with heavy calcification unchanged from 2014 study 2. Minor nonobstructive LAD and LCx stenosis 3. Severely calcified aortic valve with severe aortic stenosis (mean gradient  35 mmHg and AVA 1.06 square cm)  Recommend: Further evaluation by the multidisciplinary heart team for consideration of TAVR/PCI or other treatment options. Will obtain updated echo and CTA studies.  Indications   Severe aortic stenosis [I35.0 (ICD-10-CM)]  Procedural Details/Technique   Technical Details INDICATION: Stage D, symptomatic aortic stenosis. 83 yo male with known CAD, failed attempt at RCA PCI in 2014, has developed progressive dyspnea and found to have moderate/severe aortic stenosis. Presents for R/L heart catheterization for further evaluation.  PROCEDURAL DETAILS: There was an indwelling IV in a right antecubital vein. Using normal sterile technique, the IV was changed out for a 5 Fr brachial sheath over a 0.018 inch wire. The right wrist was then prepped, draped, and anesthetized with 1% lidocaine. I was able to pass a wire into the vein but never could advance a catheter up the arm. Attention was turned to the left groin. A front wall venous puncture was performed and a 7 Fr sheath was advanced. A swan-ganz catheter was used for the right heart catheterization.  Using the modified Seldinger technique a 5/6 French Slender sheath was placed in the left radial artery. Intra-arterial verapamil was administered through the radial artery sheath. IV heparin was administered after a JR4 catheter was advanced into the central aorta. Standard Judkins catheters were used for selective coronary angiography and aortic angiography. An AL-1 catheter and straight wire were used to cross the aortic valve. There were no immediate procedural complications. The patient was transferred to the post catheterization recovery area for further monitoring.    Estimated blood loss <50 mL.  During this procedure the patient was administered the following to achieve and maintain moderate conscious sedation: Versed 2 mg, Fentanyl 25 mcg, while the patient's heart rate, blood pressure, and oxygen saturation  were continuously monitored.    Coronary Findings   Dominance: Right  Left Anterior Descending  There is mild the vessel.  Mid LAD to Dist LAD lesion, 25% stenosed.  Ramus Intermedius  The vessel exhibits minimal luminal irregularities.  Left Circumflex  There is mild the vessel.  Mid Cx lesion, 30% stenosed.  Right Coronary Artery  Prox RCA to Mid RCA lesion, 40% stenosed. The lesion is severely calcified.  Mid RCA lesion, 95% stenosed. The lesion is severely calcified.  Dist RCA lesion, 40% stenosed. The lesion is moderately calcified.  Right Heart   Right Heart Pressures Hemodynamic findings consistent with aortic valve stenosis. Aortic Valve: mean gradient 35 mmHg and calculated aortic valve area 1.06 square cm    Coronary Diagrams   Diagnostic Diagram       Implants     No implant  documentation for this case.  PACS Images   Show images for Cardiac catheterization   Link to Procedure Log   Procedure Log    Hemo Data    Most Recent Value  Fick Cardiac Output 5.81 L/min  Fick Cardiac Output Index 2.89 (L/min)/BSA  Aortic Mean Gradient 34.7 mmHg  Aortic Peak Gradient 34 mmHg  Aortic Valve Area 1.06  Aortic Value Area Index 0.53 cm2/BSA  RA A Wave -99 mmHg  RA V Wave 13 mmHg  RA Mean 12 mmHg  RV Systolic Pressure 51 mmHg  RV Diastolic Pressure 10 mmHg  RV EDP 14 mmHg  PA Systolic Pressure 53 mmHg  PA Diastolic Pressure 24 mmHg  PA Mean 34 mmHg  PW A Wave -99 mmHg  PW V Wave 32 mmHg  PW Mean 22 mmHg  AO Systolic Pressure 202 mmHg  AO Diastolic Pressure 63 mmHg  AO Mean 91 mmHg  LV Systolic Pressure 542 mmHg  LV Diastolic Pressure 12 mmHg  LV EDP 17 mmHg  Arterial Occlusion Pressure Extended Systolic Pressure 706 mmHg  Arterial Occlusion Pressure Extended Diastolic Pressure 55 mmHg  Arterial Occlusion Pressure Extended Mean Pressure 88 mmHg  Left Ventricular Apex Extended Systolic Pressure 237 mmHg  Left Ventricular Apex Extended Diastolic Pressure  15 mmHg  Left Ventricular Apex Extended EDP Pressure 19 mmHg  QP/QS 1  TPVR Index 11.75 HRUI  TSVR Index 31.46 HRUI  PVR SVR Ratio 0.15  TPVR/TSVR Ratio 0.37    CT CORONARY MORPH W/CTA COR W/SCORE W/CA W/CM &/OR WO/CM (Accession [id number]) (Order [id number])  Imaging  Date: 05/23/2016 Department: [facility] CT IMAGING Released By: Marijo Sanes Authorizing: Sherren Mocha, MD  Exam Information   Status Exam Begun  Exam Ended   Final [99] 05/23/2016 3:54 PM 05/23/2016 4:27 PM  PACS Images   Show images for CT CORONARY MORPH W/CTA COR W/SCORE W/CA W/CM &/OR WO/CM  Addendum   ADDENDUM REPORT: 05/24/2016 08:53  CLINICAL DATA:  83 year old male with severe aortic stenosis.  EXAM: Cardiac TAVR CT  TECHNIQUE: The patient was scanned on a Philips 256 scanner. A 120 kV retrospective scan was triggered in the descending thoracic aorta at 111 HU's. Gantry rotation speed was 270 msecs and collimation was .9 mm. No beta blockade or nitro were given. The 3D data set was reconstructed in 5% intervals of the R-R cycle. Systolic and diastolic phases were analyzed on a dedicated work station using MPR, MIP and VRT modes. The patient received 80 cc of contrast.  FINDINGS: Aortic Valve: Trileaflet, severely thickened and symmetrically severely calcified aortic valve with severely restricted leaflet opening. There are minimal calcifications extending into the LVOT.  Aorta: Normal size, moderate diffuse calcifications with severe atherosclerosis. No dissection.  Sinotubular Junction:  30 x 27 mm  Ascending Thoracic Aorta:  32 x 32 mm  Aortic Arch:  29 x 25 mm  Descending Thoracic Aorta:  28 x 26 mm  Sinus of Valsalva Measurements:  Non-coronary:  33 mm  Right -coronary:  34 mm  Left -coronary:  36 mm  Coronary Artery Height above Annulus:  Left Main:  15 mm  Right Coronary:  20 mm  Virtual Basal Annulus  Measurements:  Maximum/Minimum Diameter:  31 x 26 mm  Perimeter:  106 mm  Area:  622 mm2  Optimum Fluoroscopic Angle for Delivery:  LAO5 CAU 5  IMPRESSION: 1. Trileaflet, severely thickened and symmetrically severely calcified aortic valve with severely restricted leaflet opening. There are minimal calcifications extending  into the LVOT. Annular measurements suitable for delivery of a 29 mm Edwards-SAPIEN 3 TAVR valve.  2.  Sufficient annulus to coronary distance.  3.  Optimum Fluoroscopic Angle for Delivery:  LAO5 CAU 5.  4. There is a filling defect in the tip of a large left atrial appendage. This might represent lack of contrast or a thrombus. A TEE prior to the procedure is recommended.  Ena Dawley   Electronically Signed   By: Ena Dawley   On: 05/24/2016 08:53   Addended by Dorothy Spark, MD on 05/24/2016 8:56 AM    Study Result   EXAM: OVER-READ INTERPRETATION  CT CHEST  The following report is an over-read performed by radiologist Dr. Rebekah Chesterfield [facility] on 05/24/2016. This over-read does not include interpretation of cardiac or coronary anatomy or pathology. The coronary CTA interpretation by the cardiologist is attached.  COMPARISON:  Chest CT 12/03/2012.  FINDINGS: Extracardiac findings will be described separately under contemporaneously obtained CTA of the chest, abdomen and pelvis 05/23/2016.  IMPRESSION: Please see separate dictation for contemporaneously obtained CTA of the chest, abdomen and pelvis 05/23/2016 for full description of extracardiac findings.  Electronically Signed: By: Vinnie Langton M.D. On: 05/24/2016 08:11       CT Angio Abd/Pel w/ and/or w/o (Accession [id number]) (Order [id number])  Imaging  Date: 05/23/2016 Department: [facility] CT IMAGING Released By: Danielle Dess Authorizing: Sherren Mocha, MD  Exam Information   Status Exam Begun   Exam Ended   Final [99] 05/23/2016 3:55 PM 05/23/2016 4:30 PM  PACS Images   Show images for CT Angio Abd/Pel w/ and/or w/o  Study Result   CLINICAL DATA:  83 year old male with history of severe aortic stenosis. Preprocedural study prior to potential transcatheter aortic valve replacement (TAVR) procedure.  EXAM: CT ANGIOGRAPHY CHEST, ABDOMEN AND PELVIS  TECHNIQUE: Multidetector CT imaging through the chest, abdomen and pelvis was performed using the standard protocol during bolus administration of intravenous contrast. Multiplanar reconstructed images and MIPs were obtained and reviewed to evaluate the vascular anatomy.  CONTRAST:  75 mL of Isovue 370.  COMPARISON:  CT the abdomen and pelvis 09/27/2013. Chest CT 12/03/2012.  FINDINGS: CTA CHEST FINDINGS  Cardiovascular: Heart size is mildly enlarged with left atrial dilatation. There is no significant pericardial fluid, thickening or pericardial calcification. Large filling defect in the tip of the left atrial appendage, highly concerning for left atrial appendage thrombus. There is aortic atherosclerosis, as well as atherosclerosis of the great vessels of the mediastinum and the coronary arteries, including calcified atherosclerotic plaque in the left main, left anterior descending, left circumflex and right coronary arteries. Severe thickening calcification of the aortic valve.  Mediastinum/Lymph Nodes: No pathologically enlarged mediastinal or hilar lymph nodes. Esophagus is unremarkable in appearance. No axillary lymphadenopathy.  Lungs/Pleura: Small to moderate right pleural effusion which appears likely partially loculated. Trace left pleural effusion lying dependently. Areas of scarring and/or subsegmental atelectasis are noted in the right lower lobe. Thin-walled cyst in the periphery of the right lower lobe again noted. No suspicious appearing pulmonary nodules or masses are noted. No acute  consolidative airspace disease.  Musculoskeletal/Soft Tissues: There are no aggressive appearing lytic or blastic lesions noted in the visualized portions of the skeleton.  CTA ABDOMEN AND PELVIS FINDINGS  Hepatobiliary: Liver has a shrunken appearance and nodular contour, indicative of underlying cirrhosis. 1.3 cm low-attenuation lesion in the inferior aspect of segment 5 is compatible with a simple cyst. Subcentimeter low-attenuation  lesion in segment 4A is too small to characterize, but likely a tiny cyst. No other suspicious appearing hepatic lesions are noted. No intra or extrahepatic biliary ductal dilatation. Status post cholecystectomy.  Pancreas: No pancreatic mass. No pancreatic ductal dilatation. No pancreatic or peripancreatic fluid or inflammatory changes.  Spleen: Unremarkable.  Adrenals/Urinary Tract: There are multiple low-attenuation lesions in both kidneys, compatible with simple cysts, measuring up to 4.1 cm in diameter in the anterior aspect of the interpolar region of the left kidney. Other subcentimeter low-attenuation lesions in both kidneys are too small to definitively characterize, but are also favored to represent small cysts. Bilateral adrenal glands are normal in appearance. No hydroureteronephrosis. Urinary bladder is unremarkable in appearance.  Stomach/Bowel: The appearance of the stomach is normal. There is no pathologic dilatation of small bowel or colon. In the cecum adjacent to the ileocecal valve there is a 4.6 x 5.6 x 3.7 cm mass-like lesion (axial image 197 of series 401 and coronal image 26 of series 403), which could represent a large polyp or malignant lesion. Several colonic diverticulae are noted, particularly in the descending colon and sigmoid colon, without surrounding inflammatory changes to suggest an acute diverticulitis at this time. Normal appendix.  Vascular/Lymphatic: Vascular findings and measurements pertinent  to potential TAVR procedure, as detailed below. Notably, the patient has undergone aorto bi-iliac bypass surgery with an aorto bi-iliac bypass graft which extends from the infrarenal abdominal aorta to the external iliac arteries bilaterally. The graft is widely patent. There are portions of the native external iliac artery use which remain patent and communicate with the remaining pelvic vasculature, which should be noted prior to retrograde placement of catheters at time of attempted TAVR. 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 Celiac axis is widely patent. Inferior mesenteric artery is patent, although the origin and proximal aspect of the vessel appear occluded, with perfusion to the distal aspect of the vessel presumably via collaterals. Mild stenosis in the proximal right renal artery. Left renal artery is widely patent. No lymphadenopathy noted in the abdomen or pelvis.  Reproductive: Prostate gland and seminal vesicles are unremarkable in appearance.  Other: Small left inguinal hernia containing only fat. Small volume of ascites, predominantly in the low anatomic pelvis. No pneumoperitoneum.  Musculoskeletal: There are no aggressive appearing lytic or blastic lesions noted in the visualized portions of the skeleton.  VASCULAR MEASUREMENTS PERTINENT TO TAVR:  AORTA:  Minimal Aortic Diameter -  18 x 18 mm  Severity of Aortic Calcification -  moderate  RIGHT PELVIS:  Right Common Iliac Artery -  Minimal Diameter - 7.9 x 7.1 mm  Tortuosity - mild  Calcification - none (graft)  Right External Iliac Artery -  Minimal Diameter - 9.0 x 6.7 mm  Tortuosity - mild  Calcification - mild to moderate  Right Common Femoral Artery -  Minimal Diameter - 7.2 x 5.7 mm  Tortuosity - mild  Calcification - severe  LEFT PELVIS:  Left Common Iliac Artery -  Minimal Diameter - 8.2 x 6.7 mm  Tortuosity -  mild  Calcification - none (graft)  Left External Iliac Artery -  Minimal Diameter - 7.8 x 7.1 mm  Tortuosity - mild  Calcification - mild to moderate  Left Common Femoral Artery -  Minimal Diameter - 6.7 x 6.4 mm  Tortuosity - mild  Calcification - severe  Review of the MIP images confirms the above findings.  IMPRESSION: 1. Vascular findings and measurements pertinent to potential TAVR  procedure, as detailed above. The patient does appear to have suitable pelvic arterial access, however, the following features should be noted. Both common femoral arteries are densely calcified, at times circumferentially calcified. Patient has undergone prior aorto bi-iliac bypass graft which extends to the external iliac arteries bilaterally. Portions of the proximal native external iliac arteries are patent, and communicate with the remaining pelvic arterial vasculature. At the time of attempted retrograde catheter placement during potential TAVR, this should be noted. 2. Severe thickening calcification of the aortic valve, compatible with the reported clinical history of aortic stenosis. 3. Cardiomegaly with left atrial dilatation, and a filling defect in the tip of the left atrial appendage highly concerning for left atrial appendage thrombus. This places the patient at risk for systemic embolization. 4. **An incidental finding of potential clinical significance has been found. 4.6 x 5.6 x 3.7 cm masslike lesion in the cecum adjacent to the ileocecal valve may represent a large polyp or colonic neoplasm. Correlation with nonemergent colonoscopy is strongly recommended in the near future to better evaluate this finding.** 5. 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 6. Small to moderate partially loculated right pleural effusion and trace left pleural effusion lying dependently. 7. Cirrhosis with small volume of ascites. 8. Colonic  diverticulosis without evidence to suggest acute diverticulitis at this time. 9. Additional incidental findings, as above.   Electronically Signed   By: Vinnie Langton M.D.   On: 05/24/2016 11:45    STS Risk Calculator  Procedure                                          AVR + CABG  Risk of Mortality                                2.98% Morbidity or Mortality                       17.8% Prolonged LOS                                   9.4% Short LOS                                           24.1% Permanent Stroke                             1.7% Prolonged Vent Support                     10.1% DSW Infection                                     0.3% Renal Failure                                       5.0% Reoperation  8.3%  Impression:  This 83 year old gentleman has stage D severe, symptomatic aortic stenosis and high grade single vessel coronary artery disease with recent onset of NYHA class II symptoms of exertional shortness of breath and fatigue consistent with chronic diastolic heart failure. I have personally reviewed his echo, cath and CT images. He probably has a trileaflet aortic valve with mildly calcified leaflets with restricted mobility. His mean gradient was measured at 32 mm Hg but the peak velocity varies due to his atrial fibrillation. His DI is 0.2 consistent with severe AS. His cath shows a stable high grade mid RCA stenosis that is calcified and could not be opened previously. He has no other significant disease and no angina so I think this can be left alone. I agree that AVR is indicated for this patient but his risk for open surgical AVR and CABG is moderately elevated due to his age. I think TAVR would be a reasonable alternative.  His gated cardiac CT shows anatomy favorable for TAVR using a Sapien 3 valve. There is a small filling defect in the tip of the LAA that could be a small thrombus or just lack on contrast but he  has been on Coumadin so I think this does not really alter the decision making and will be further evaluated in the OR with TEE. His abdominal and pelvic CT shows adequate pelvic vessels for transfemoral access although both common femoral arteries are concentrically calcified distally and he still has patent proximal external iliac arteries communicating with the deep pelvic vessels s/p aorto-bi-iliac bypass for AAA.   The patient was counseled at length regarding treatment alternatives for management of severe symptomatic aortic stenosis. The risks and benefits of surgical intervention has been discussed in detail. Long-term prognosis with medical therapy was discussed. Alternative approaches such as conventional surgical aortic valve replacement, transcatheter aortic valve replacement, and palliative medical therapy were compared and contrasted at length. This discussion was placed in the context of the patient's own specific clinical presentation and past medical history. All of their questions been addressed. The patient is eager to proceed with surgical management as soon as possible.   Following the decision to proceed with transcatheter aortic valve replacement, a discussion was held regarding what types of management strategies would be attempted intraoperatively in the event of life-threatening complications, including whether or not the patient would be considered a candidate for the use of cardiopulmonary bypass and/or conversion to open sternotomy for attempted surgical intervention. The patient is aware of the fact that transient use of cardiopulmonary bypass may be necessary. He would like to have open surgery performed if possible if there is any difficulty performing TAVR or if complications arise that require sternotomy to repair.   The patient has been advised of a variety of complications that might develop including but not limited to risks of death, stroke, paravalvular leak, aortic  dissection or other major vascular complications, aortic annulus rupture, device embolization, cardiac rupture or perforation, mitral regurgitation, acute myocardial infarction, arrhythmia, heart block or bradycardia requiring permanent pacemaker placement, congestive heart failure, respiratory failure, renal failure, pneumonia, infection, other late complications related to structural valve deterioration or migration, or other complications that might ultimately cause a temporary or permanent loss of functional independence or other long term morbidity. The patient provides full informed consent for the procedure as described and all questions were answered.     Plan:  He is scheduled for transfemoral TAVR on 06/13/2016. He is going to stop  his Coumadin 5 days prior to surgery.    I spent 45 minutes performing this consultation and > 50% of this time was spent face to face counseling and coordinating the care of this patient's severe aortic stenosis.   Gaye Pollack, MD 06/01/2016 3:18 PM

## 2016-06-13 NOTE — Anesthesia Procedure Notes (Signed)
Central Venous Catheter Insertion Performed by: Oleta Mouse, anesthesiologist Start/End5/02/2016 2:28 PM, 06/13/2016 2:32 PM Patient location: OR. Preanesthetic checklist: patient identified, IV checked, site marked, risks and benefits discussed, surgical consent, monitors and equipment checked, pre-op evaluation, timeout performed and anesthesia consent Patient sedated Hand hygiene performed  and maximum sterile barriers used  Catheter size: 8 Fr Total catheter length 16. Central line was placed.Double lumen Procedure performed without using ultrasound guided technique. Attempts: 1 Following insertion, dressing applied, line sutured and Biopatch. Post procedure assessment: blood return through all ports  Patient tolerated the procedure well with no immediate complications.

## 2016-06-13 NOTE — Anesthesia Procedure Notes (Signed)
Procedure Name: Intubation Date/Time: 06/13/2016 1:33 PM Performed by: Suzy Bouchard Pre-anesthesia Checklist: Patient identified, Emergency Drugs available, Suction available, Patient being monitored and Timeout performed Patient Re-evaluated:Patient Re-evaluated prior to inductionOxygen Delivery Method: Circle system utilized Preoxygenation: Pre-oxygenation with 100% oxygen Intubation Type: IV induction Ventilation: Mask ventilation without difficulty Laryngoscope Size: Miller and 2 Grade View: Grade II Tube type: Oral Tube size: 8.0 mm Number of attempts: 1 Airway Equipment and Method: Stylet Placement Confirmation: ETT inserted through vocal cords under direct vision,  CO2 detector and breath sounds checked- equal and bilateral Secured at: 22 cm Tube secured with: Tape Dental Injury: Teeth and Oropharynx as per pre-operative assessment

## 2016-06-13 NOTE — Op Note (Signed)
[facility]   MULTIDISCIPLINARY HEART VALVE TEAM   TAVR OPERATIVE NOTE   Date of Procedure:  06/13/2016  Preoperative Diagnosis: Severe Aortic Stenosis   Postoperative Diagnosis: Same   Procedure:    Transcatheter Aortic Valve Replacement - Percutaneous Right Transfemoral Approach  Edwards Sapien 3 THV (size 29 mm, model # 9600TFX, serial # [id number])   Co-Surgeons:  Valentina Gu. Roxy Manns, MD and Sherren Mocha, MD  Anesthesiologist:  Laurie Panda, MD  Echocardiographer:  Ena Dawley, MD  Pre-operative Echo Findings:  Severe aortic stenosis  Moderate left ventricular systolic dysfunction  Post-operative Echo Findings:  Trivial paravalvular leak  Unchanged left ventricular systolic function   BRIEF CLINICAL NOTE AND INDICATIONS FOR SURGERY  Patient is an 83 year old male with history of aortic stenosis, coronary artery disease, long-standing persistent atrial fibrillation on chronic warfarin anticoagulation, GE reflux disease, and abdominal aortic aneurysm status post open surgical repair in the distant past who has been referred for surgical consultation to discuss treatment options for management of severe symptomatic aortic stenosis. Patient has long history of heart murmur and aortic stenosis discovered nearly 20 years ago when he first presented with atrial fibrillation. He developed symptomatic coronary artery disease and underwent percutaneous coronary intervention on multiple occasions in the past. He has been followed for the last several years by Dr. Burt Knack.  In 2014 he was hospitalized for unstable angina. Diagnostic cardiac catheterization revealed 99% stenosis of mid right coronary artery without any significant disease in the left coronary system. PCI was attempted but unsuccessful because of severe calcification and chronicity of disease.  Since then the patient has done remarkably well on long-term medical therapy. Serial echocardiograms have  documented a gradual progression severity of aortic stenosis. The patient recently developed fairly sudden onset of exertional shortness of breath without chest discomfort. He has remained remarkably active physically, but recently when on a walk he experienced a prolonged episode of shortness of breath that developed when he was trying to walk up a hill. He recovered fairly quickly with rest. Since then he has noticed decreased energy and intermittent episodes of exertional shortness of breath. He does not get short of breath with ordinary activities around the house and he has never had any resting shortness of breath or chest discomfort. He has chronic history of lower extremity edema that is at least partially related to degenerative arthritis involving both knees with previous bilateral knee surgery.  He was seen in follow-up recently by Dr. Burt Knack following the onset of symptoms of exertional shortness of breath. He underwent repeat diagnostic cardiac catheterization on 05/12/2016 revealing severe stenosis of the mid right coronary artery with heavy calcification, unchanged from the last previous diagnostic catheterization performed in 2014. There remained only minor nonobstructive coronary artery disease in the left coronary circulation. There was severe aortic stenosis with mean transvalvular gradient measured 35 mmHg by catheterization. The patient subsequently underwent follow-up echocardiogram 05/18/2016 which confirmed the presence of severe aortic stenosis with normal left ventricular systolic function. The patient subsequently underwent CT angiography and has been referred for elective surgical consultation.  Following the decision to proceed with transcatheter aortic valve replacement, a discussion has been held regarding what types of management strategies would be attempted intraoperatively in the event of life-threatening complications, including whether or not the patient would be considered a  candidate for the use of cardiopulmonary bypass and/or conversion to open sternotomy for attempted surgical intervention.  The patient has been advised of a variety of complications that might  develop peculiar to this approach including but not limited to risks of death, stroke, paravalvular leak, aortic dissection or other major vascular complications, aortic annulus rupture, device embolization, cardiac rupture or perforation, acute myocardial infarction, arrhythmia, heart block or bradycardia requiring permanent pacemaker placement, congestive heart failure, respiratory failure, renal failure, pneumonia, infection, other late complications related to structural valve deterioration or migration, or other complications that might ultimately cause a temporary or permanent loss of functional independence or other long term morbidity.  The patient provides full informed consent for the procedure as described and all questions were answered preoperatively.    DETAILS OF THE OPERATIVE PROCEDURE  PREPARATION:    The patient is brought to the operating room on the above mentioned date and central monitoring was established by the anesthesia team including placement of Swan-Ganz catheter and radial arterial line. The patient is placed in the supine position on the operating table.  Intravenous antibiotics are administered.  General endotracheal anesthesia is induced uneventfully.  A Foley catheter is placed.  Baseline transesophageal echocardiogram was performed. The patient's chest, abdomen, both groins, and both lower extremities are prepared and draped in a sterile manner. A time out procedure is performed.   PERIPHERAL ACCESS:    Using the modified Seldinger technique, femoral arterial and venous access was obtained with placement of 6 Fr sheaths on the left side.  A pigtail diagnostic catheter was passed through the left arterial sheath under fluoroscopic guidance into the aortic root.  Details of  cannulating the patient's aortobifemoral bypass graft are dictated in a separate note by Dr. Burt Knack.  A temporary transvenous pacemaker catheter was passed through the left femoral venous sheath under fluoroscopic guidance into the right ventricle.  The pacemaker was tested to ensure stable lead placement and pacemaker capture. Aortic root angiography was performed in order to determine the optimal angiographic angle for valve deployment.   TRANSFEMORAL ACCESS:   Percutaneous transfemoral access and sheath placement was performed by Dr Burt Knack. Please see his separate operative note for details. The patient was heparinized systemically and ACT verified > 250 seconds.    A 16 Fr transfemoral E-sheath was introduced into the right femoral artery after progressively dilating over an Amplatz superstiff wire. An AL-2 catheter was used to direct a straight-tip exchange length wire across the native aortic valve into the left ventricle. This was exchanged out for a pigtail catheter and position was confirmed in the LV apex. Simultaneous LV and Ao pressures were recorded.  The pigtail catheter was exchanged for an Amplatz Extra-stiff wire in the LV apex.  Echocardiography was utilized to confirm appropriate wire position and no sign of entanglement in the mitral subvalvular apparatus.   TRANSCATHETER HEART VALVE DEPLOYMENT:   An Edwards Sapien 3 transcatheter heart valve (size 29 mm, model #9600TFX, serial #[id number]) was prepared and crimped per manufacturer's guidelines, and the proper orientation of the valve is confirmed on the [employer] delivery system. The valve was advanced through the introducer sheath using normal technique until in an appropriate position in the abdominal aorta beyond the sheath tip. The balloon was then retracted and using the fine-tuning wheel was centered on the valve. The valve was then advanced across the aortic arch using appropriate flexion of the catheter. The valve was  carefully positioned across the aortic valve annulus. The Commander catheter was retracted using normal technique. Once final position of the valve has been confirmed by angiographic assessment, the valve is deployed while temporarily holding ventilation and during rapid ventricular  pacing to maintain systolic blood pressure < 50 mmHg and pulse pressure < 10 mmHg. The balloon inflation is held for >3 seconds after reaching full deployment volume. Once the balloon has fully deflated the balloon is retracted into the ascending aorta and valve function is assessed using echocardiography. There is felt to be trivial paravalvular leak and no central aortic insufficiency.  The patient's hemodynamic recovery following valve deployment is good.  The deployment balloon and guidewire are both removed. Echo demostrated acceptable post-procedural gradients, stable mitral valve function, and no aortic insufficiency.    PROCEDURE COMPLETION:   The sheath was removed and femoral artery closure performed by Dr Burt Knack. Please see his separate report for details.   Protamine was administered once femoral arterial repair was complete. The temporary pacemaker, pigtail catheters and femoral sheaths were removed with manual pressure used for hemostasis.   The patient tolerated the procedure well and is transported to the surgical intensive care in stable condition. There were no immediate intraoperative complications. All sponge instrument and needle counts are verified correct at completion of the operation.   No blood products were administered during the operation.  The patient received a total of 55.6 mL of intravenous contrast during the procedure.   Rexene Alberts, MD 06/13/2016 3:37 PM

## 2016-06-13 NOTE — Interval H&P Note (Signed)
History and Physical Interval Note:  06/13/2016 12:26 PM  Tyler Williams  has presented today for surgery, with the diagnosis of SEVERE AS  The various methods of treatment have been discussed with the patient and family. After consideration of risks, benefits and other options for treatment, the patient has consented to  Procedure(s): TRANSCATHETER AORTIC VALVE REPLACEMENT, TRANSFEMORAL (N/A) TRANSESOPHAGEAL ECHOCARDIOGRAM (TEE) (N/A) as a surgical intervention .  The patient's history has been reviewed, patient examined, no change in status, stable for surgery.  I have reviewed the patient's chart and labs.  Questions were answered to the patient's satisfaction.     Sherren Mocha

## 2016-06-13 NOTE — Op Note (Signed)
  [facility]   MULTIDISCIPLINARY HEART VALVE TEAM   TAVR OPERATIVE NOTE   Date of Procedure:  06/13/2016  Preoperative Diagnosis: Severe Aortic Stenosis   Postoperative Diagnosis: Same   Procedure:    Transcatheter Aortic Valve Replacement - Percutaneous Transfemoral Approach  Edwards Sapien 3 THV (size 29 mm, model # 9600TFX, serial # [id number])   Co-Surgeons:  Valentina Gu. Roxy Manns, MD and Sherren Mocha, MD  Anesthesiologist:  Laurie Panda, MD  Echocardiographer:  Ena Dawley, MD  Pre-operative Echo Findings:  Severe aortic stenosis  Low normal left ventricular systolic function  Post-operative Echo Findings:  no paravalvular leak  unchanged left ventricular systolic function  Please see the complete note of Dr Roxy Manns for full indications and details of the operative procedure.   PERIPHERAL ACCESS:    Using the modified Seldinger technique, femoral arterial and venous access was obtained with placement of 6 Fr sheaths on the left side.  A pigtail diagnostic catheter was passed through the left femoral arterial sheath under fluoroscopic guidance into the aortic root.  The patient has undergone aorto-iliac grafting and the grafts were difficult to access on both sides. On the left side, a 6 Fr sheath is inserted and femoral angiography is performed. Using a JR4 catheter and a roadmap image, a Wholey wire is advanced into the graft and then advanced into the thoracic aorta where a pigtail catheter is placed and advanced for aortic root angiography. A temporary transvenous pacemaker catheter was passed through the left femoral venous sheath under fluoroscopic guidance into the right ventricle.  The pacemaker was tested to ensure stable lead placement and pacemaker capture. Aortic root angiography was performed in order to determine the optimal angiographic angle for valve deployment.   TRANSFEMORAL ACCESS:  A micropuncture technique is used to access the right  femoral artery under fluoroscopic guidance.  Femoral angiography is performed to help define the distal anastomosis of the aortoiliac graft. A JR4 catheter is used to direct an angled glidewire into the graft and to access the abdominal aorta. 2 Perclose devices are deployed at 10' and 2' positions to 'PreClose' the femoral artery. An 8 French sheath is placed and then an Amplatz Superstiff wire is advanced through the sheath. This is changed out for a 16 French transfemoral E-Sheath after progressively dilating over the Superstiff wire.    PROCEDURE COMPLETION:  The sheath was removed and femoral artery closure is performed using the 2 previously deployed Perclose devices.  Protamine was administered once femoral arterial repair was complete. The temporary pacemaker, pigtail catheters and femoral sheaths were removed with manual pressure used for hemostasis.   The patient tolerated the procedure well and is transported to the surgical intensive care in stable condition. There were no immediate intraoperative complications. All sponge instrument and needle counts are verified correct at completion of the operation.   The patient received a total of 56 mL of intravenous contrast during the procedure.   Sherren Mocha, MD 06/13/2016 4:25 PM

## 2016-06-13 NOTE — Anesthesia Postprocedure Evaluation (Addendum)
Anesthesia Post Note  Patient: Tyler Williams  Procedure(s) Performed: Procedure(s) (LRB): TRANSCATHETER AORTIC VALVE REPLACEMENT, TRANSFEMORAL (N/A) TRANSESOPHAGEAL ECHOCARDIOGRAM (TEE) (N/A)  Patient location during evaluation: SICU Anesthesia Type: General Level of consciousness: sedated and patient cooperative Pain management: pain level controlled Vital Signs Assessment: post-procedure vital signs reviewed and stable Respiratory status: spontaneous breathing Cardiovascular status: stable Anesthetic complications: no       Last Vitals:  Vitals:   06/13/16 1800 06/13/16 1815  BP: 126/65 136/66  Pulse: (!) 51 (!) 52  Resp: 16 15    Last Pain: There were no vitals filed for this visit.               Nolon Nations

## 2016-06-13 NOTE — Anesthesia Procedure Notes (Signed)
Arterial Line Insertion Start/End5/02/2016 11:30 AM, 06/13/2016 11:43 AM Performed by: Josephine Igo, CRNA  Patient location: Pre-op. Preanesthetic checklist: patient identified and IV checked Lidocaine 1% used for infiltration Right, radial was placed Catheter size: 20 G Hand hygiene performed  and maximum sterile barriers used   Attempts: 1 Procedure performed without using ultrasound guided technique. Following insertion, dressing applied and Biopatch. Patient tolerated the procedure well with no immediate complications.

## 2016-06-14 ENCOUNTER — Encounter (HOSPITAL_COMMUNITY): Payer: Self-pay | Admitting: Cardiovascular Disease

## 2016-06-14 ENCOUNTER — Inpatient Hospital Stay (HOSPITAL_COMMUNITY): Payer: Medicare Other

## 2016-06-14 ENCOUNTER — Other Ambulatory Visit: Payer: Self-pay

## 2016-06-14 DIAGNOSIS — I350 Nonrheumatic aortic (valve) stenosis: Principal | ICD-10-CM

## 2016-06-14 DIAGNOSIS — Z952 Presence of prosthetic heart valve: Secondary | ICD-10-CM

## 2016-06-14 DIAGNOSIS — Z954 Presence of other heart-valve replacement: Secondary | ICD-10-CM

## 2016-06-14 DIAGNOSIS — I482 Chronic atrial fibrillation: Secondary | ICD-10-CM

## 2016-06-14 LAB — BASIC METABOLIC PANEL
Anion gap: 6 (ref 5–15)
BUN: 13 mg/dL (ref 6–20)
CO2: 24 mmol/L (ref 22–32)
Calcium: 8.3 mg/dL — ABNORMAL LOW (ref 8.9–10.3)
Chloride: 106 mmol/L (ref 101–111)
Creatinine, Ser: 0.75 mg/dL (ref 0.61–1.24)
GFR calc Af Amer: >60 mL/min (ref >60)
GFR calc non Af Amer: >60 mL/min (ref >60)
Glucose, Bld: 105 mg/dL — ABNORMAL HIGH (ref 65–99)
Potassium: 4 mmol/L (ref 3.5–5.1)
Sodium: 136 mmol/L (ref 135–145)

## 2016-06-14 LAB — CBC
HCT: 40.6 % (ref 39.0–52.0)
Hemoglobin: 13.7 g/dL (ref 13.0–17.0)
MCH: 32.3 pg (ref 26.0–34.0)
MCHC: 33.7 g/dL (ref 30.0–36.0)
MCV: 95.8 fL (ref 78.0–100.0)
Platelets: 89 10*3/uL — ABNORMAL LOW (ref 150–400)
RBC: 4.24 MIL/uL (ref 4.22–5.81)
RDW: 13.6 % (ref 11.5–15.5)
WBC: 7.9 10*3/uL (ref 4.0–10.5)

## 2016-06-14 LAB — MAGNESIUM: Magnesium: 2 mg/dL (ref 1.7–2.4)

## 2016-06-14 MED ORDER — WARFARIN - PHARMACIST DOSING INPATIENT: Freq: Every day | Status: DC

## 2016-06-14 MED ORDER — ACETAMINOPHEN 325 MG PO TABS: 650.0000 mg | ORAL_TABLET | Freq: Four times a day (QID) | ORAL | Status: DC | PRN

## 2016-06-14 MED ORDER — METOPROLOL SUCCINATE ER 25 MG PO TB24
25.0000 mg | ORAL_TABLET | Freq: Every day | ORAL | Status: DC
  Administered 2016-06-14 – 2016-06-15 (×2): 25 mg via ORAL
  Filled 2016-06-14 (×3): qty 1

## 2016-06-14 MED ORDER — RAMIPRIL 10 MG PO CAPS
10.0000 mg | ORAL_CAPSULE | Freq: Two times a day (BID) | ORAL | Status: DC
  Administered 2016-06-14 – 2016-06-15 (×3): 10 mg via ORAL
  Filled 2016-06-14 (×3): qty 1

## 2016-06-14 MED ORDER — WARFARIN SODIUM 7.5 MG PO TABS
7.5000 mg | ORAL_TABLET | Freq: Once | ORAL | Status: AC
  Administered 2016-06-14: 7.5 mg via ORAL
  Filled 2016-06-14: qty 1

## 2016-06-14 MED ORDER — ISOSORBIDE MONONITRATE ER 60 MG PO TB24
60.0000 mg | ORAL_TABLET | Freq: Every day | ORAL | Status: DC
  Administered 2016-06-14 – 2016-06-15 (×2): 60 mg via ORAL
  Filled 2016-06-14 (×2): qty 1

## 2016-06-14 MED ORDER — LORAZEPAM 1 MG PO TABS
1.0000 mg | ORAL_TABLET | Freq: Every day | ORAL | Status: DC
  Administered 2016-06-14: 1 mg via ORAL
  Filled 2016-06-14: qty 1

## 2016-06-14 MED ORDER — HYDROCHLOROTHIAZIDE 12.5 MG PO CAPS
12.5000 mg | ORAL_CAPSULE | Freq: Every day | ORAL | Status: DC
  Administered 2016-06-14 – 2016-06-15 (×2): 12.5 mg via ORAL
  Filled 2016-06-14 (×2): qty 1

## 2016-06-14 NOTE — Progress Notes (Signed)
Attempted to call report.  Receiving nurse is at lunch and will call back.

## 2016-06-14 NOTE — Progress Notes (Addendum)
[address]       [place] [address]             [phone]        CARDIOTHORACIC SURGERY PROGRESS NOTE   R1 Day Post-Op Procedure(s) (LRB): TRANSCATHETER AORTIC VALVE REPLACEMENT, TRANSFEMORAL (N/A) TRANSESOPHAGEAL ECHOCARDIOGRAM (TEE) (N/A)  Subjective: Feels well.  Wants to go home  Objective: Vital signs: BP Readings from Last 1 Encounters:  06/14/16 (!) 163/68   Pulse Readings from Last 1 Encounters:  06/14/16 68   Resp Readings from Last 1 Encounters:  06/14/16 17   Temp Readings from Last 1 Encounters:  06/14/16 98.4 F (36.9 C) (Oral)    Hemodynamics:    Physical Exam:  Rhythm:   Afib w/ controlled rate  Breath sounds: clear  Heart sounds:  irregular  Incisions:  Both groins okay  Abdomen:  Soft, non-distended, non-tender  Extremities:  Warm, well-perfused    Intake/Output from previous day: 05/01 0701 - 05/02 0700 In: 1093.6 [I.V.:843.6; IV Piggyback:250] Out: 4481 [Urine:1195; Blood:50] Intake/Output this shift: Total I/O In: 213.2 [I.V.:43.2; Other:70; IV Piggyback:100] Out: 250 [Urine:250]  Lab Results:  CBC: Recent Labs  06/13/16 1651 06/14/16 0325  WBC 6.3 7.9  HGB 14.4 13.7  HCT 42.7 40.6  PLT 89* 89*    BMET:  Recent Labs  06/13/16 1537 06/13/16 1649 06/14/16 0325  NA 140 142 136  K 4.0 4.2 4.0  CL 105  --  106  CO2  --   --  24  GLUCOSE 102* 98 105*  BUN 14  --  13  CREATININE 0.70  --  0.75  CALCIUM  --   --  8.3*     PT/INR:   Recent Labs  06/13/16 1651  LABPROT 14.5  INR 1.13    CBG (last 3)  No results for input(s): GLUCAP in the last 72 hours.  ABG    Component Value Date/Time   PHART 7.347 (L) 06/13/2016 1649   PCO2ART 40.6 06/13/2016 1649   PO2ART 78.0 (L) 06/13/2016 1649   HCO3 22.5 06/13/2016 1649   TCO2 24 06/13/2016 1649   ACIDBASEDEF 3.0 (H) 06/13/2016 1649   O2SAT 95.0 06/13/2016 1649    CXR: PORTABLE CHEST 1 VIEW  COMPARISON:  Portable chest x-ray of Jun 13, 2016  FINDINGS: The lungs are adequately inflated. The interstitial markings are more prominent today. The cardiac silhouette remains enlarged and its margins are less distinct. The pulmonary vascularity is mildly engorged. There is calcification in the wall of the aortic arch. The right subclavian venous catheter tip projects over the midportion of the SVC.  IMPRESSION: Mild hypoinflation today. CHF with mild interstitial edema more conspicuous today. No alveolar pneumonia.  Thoracic aortic atherosclerosis.   Electronically Signed   By: David  Martinique M.D.   On: 06/14/2016 07:43   EKG: Afib w/ controlled rate, RBBB    Assessment/Plan: S/P Procedure(s) (LRB): TRANSCATHETER AORTIC VALVE REPLACEMENT, TRANSFEMORAL (N/A) TRANSESOPHAGEAL ECHOCARDIOGRAM (TEE) (N/A)  Overall doing well POD1 TAVR Maintaining rate-controlled Afib w/ stable hemodynamics although requiring IV NTG for hypertension Breathing comfortably w/ O2 sats 95% on RA   Mobilize  Restart antihypertensive meds  Restart warfarin vs DOAC for Afib  Routine f/u ECHO  Transfer tele  Anticipate d/c home tomorrow  I spent in excess of 15 minutes during the conduct of this hospital encounter and >50% of this time involved direct face-to-face encounter with the patient for counseling and/or coordination of their care.  Rexene Alberts, MD 06/14/2016 2:10 PM

## 2016-06-14 NOTE — Care Management Note (Signed)
Case Management Note Marvetta Gibbons RN, BSN Unit 2W-Case Manager-- [facility] coverage [phone]  Patient Details  Name: Tyler Williams MRN: [id number] Date of Birth: 1926/06/08  Subjective/Objective:   Pt admitted s/p TAVR on 06/13/16                 Action/Plan: PTA pt lived at home with wife- anticipate return home- CM to follow for d/c needs  Expected Discharge Date:                  Expected Discharge Plan:  Home/Self Care  In-House Referral:     Discharge planning Services  CM Consult  Post Acute Care Choice:    Choice offered to:     DME Arranged:    DME Agency:     HH Arranged:    HH Agency:     Status of Service:  In process, will continue to follow  If discussed at Long Length of Stay Meetings, dates discussed:    Discharge Disposition:   Additional Comments:  Dawayne Patricia, RN 06/14/2016, 10:08 AM

## 2016-06-14 NOTE — Progress Notes (Signed)
Patient has arrived on unit from [facility]. Patient oriented to unit, assessed, placed on tele, VS were stable, with 99.1 will give tylenol.

## 2016-06-14 NOTE — Progress Notes (Signed)
Progress Note  Patient Name: LEROI HAQUE Date of Encounter: 06/14/2016  Primary Cardiologist: Burt Knack  Subjective   Feeling ok. Eager to get up and walk. No CP or dyspnea.   Inpatient Medications    Scheduled Meds: . amLODipine  5 mg Oral Daily  . aspirin EC  325 mg Oral Daily   Or  . aspirin  324 mg Per Tube Daily  . mouth rinse  15 mL Mouth Rinse BID  . [START ON 06/15/2016] pantoprazole  40 mg Oral Daily   Continuous Infusions: . albumin human    . cefUROXime (ZINACEF)  IV Stopped (06/13/16 2002)  . famotidine (PEPCID) IV    . lactated ringers    . nitroGLYCERIN 50 mcg/min (06/14/16 0750)  . phenylephrine (NEO-SYNEPHRINE) Adult infusion Stopped (06/13/16 1700)  . potassium chloride (KCL MULTIRUN) 30 mEq in 265 mL IVPB     PRN Meds: albumin human, lactated ringers, metoprolol, midazolam, morphine injection, ondansetron (ZOFRAN) IV, oxyCODONE, traMADol   Vital Signs    Vitals:   06/14/16 0715 06/14/16 0730 06/14/16 0754 06/14/16 0800  BP:  (!) 144/66  (!) 164/64  Pulse: 72 68  74  Resp: 19 18  20   Temp:   76.5 F (37 C)   TempSrc:   Oral   SpO2: 96% 95%  96%  Weight:      Height:        Intake/Output Summary (Last 24 hours) at 06/14/16 0830 Last data filed at 06/14/16 0800  Gross per 24 hour  Intake          1117.38 ml  Output             1320 ml  Net          -202.62 ml   Filed Weights   06/13/16 1137 06/14/16 0500  Weight: 183 lb (83 kg) 188 lb 11.4 oz (85.6 kg)    Telemetry    Atrial fibrillation, HR 50's-60's, longest pause 2.3 s - Personally Reviewed  ECG    Atrial fibrillation with RBBB - Personally Reviewed  Physical Exam  Pleasant elderly male GEN: No acute distress.   Neck: No JVD Cardiac: irregular, 2/6 SEM at the RUSB, no diastolic murmur Respiratory: Clear to auscultation bilaterally. GI: Soft, nontender, non-distended  MS: 1+ edema on right, none on left; No deformity. BL groin sites clear Neuro:  Nonfocal  Psych: Normal  affect   Labs    Chemistry Recent Labs Lab 06/09/16 1037  06/13/16 1509 06/13/16 1537 06/13/16 1649 06/14/16 0325  NA 137  < > 140 140 142 136  K 4.2  < > 3.9 4.0 4.2 4.0  CL 108  < > 105 105  --  106  CO2 21*  --   --   --   --  24  GLUCOSE 88  < > 102* 102* 98 105*  BUN 18  < > 14 14  --  13  CREATININE 0.82  < > 0.60* 0.70  --  0.75  CALCIUM 9.2  --   --   --   --  8.3*  PROT 7.2  --   --   --   --   --   ALBUMIN 3.7  --   --   --   --   --   AST 25  --   --   --   --   --   ALT 19  --   --   --   --   --  ALKPHOS 75  --   --   --   --   --   BILITOT 1.4*  --   --   --   --   --   GFRNONAA >60  --   --   --   --  >60  GFRAA >60  --   --   --   --  >60  ANIONGAP 8  --   --   --   --  6  < > = values in this interval not displayed.   Hematology Recent Labs Lab 06/09/16 1037  06/13/16 1649 06/13/16 1651 06/14/16 0325  WBC 6.8  --   --  6.3 7.9  RBC 4.80  --   --  4.42 4.24  HGB 15.9  < > 14.6 14.4 13.7  HCT 46.2  < > 43.0 42.7 40.6  MCV 96.3  --   --  96.6 95.8  MCH 33.1  --   --  32.6 32.3  MCHC 34.4  --   --  33.7 33.7  RDW 13.8  --   --  13.6 13.6  PLT 99*  --   --  89* 89*  < > = values in this interval not displayed.  Cardiac EnzymesNo results for input(s): TROPONINI in the last 168 hours. No results for input(s): TROPIPOC in the last 168 hours.   BNPNo results for input(s): BNP, PROBNP in the last 168 hours.   DDimer No results for input(s): DDIMER in the last 168 hours.   Radiology    Dg Chest Port 1 View  Result Date: 06/14/2016 CLINICAL DATA:  Chest soreness following transcatheter aortic valve replacement. EXAM: PORTABLE CHEST 1 VIEW COMPARISON:  Portable chest x-ray of Jun 13, 2016 FINDINGS: The lungs are adequately inflated. The interstitial markings are more prominent today. The cardiac silhouette remains enlarged and its margins are less distinct. The pulmonary vascularity is mildly engorged. There is calcification in the wall of the aortic  arch. The right subclavian venous catheter tip projects over the midportion of the SVC. IMPRESSION: Mild hypoinflation today. CHF with mild interstitial edema more conspicuous today. No alveolar pneumonia. Thoracic aortic atherosclerosis. Electronically Signed   By: David  Martinique M.D.   On: 06/14/2016 07:43   Dg Chest Port 1 View  Result Date: 06/13/2016 CLINICAL DATA:  Atelectasis. EXAM: PORTABLE CHEST 1 VIEW COMPARISON:  June 09, 2016 FINDINGS: Central catheter tip is in the superior vena cava at the level of the carina. No pneumothorax. There is a minimal left pleural effusion with slight left base atelectasis. Lungs elsewhere are clear. Heart is slightly enlarged with pulmonary vascularity within normal limits. There is an aortic valve replacement. There is aortic atherosclerosis. No adenopathy. No bone lesions. IMPRESSION: Central catheter as described without pneumothorax. Minimal left pleural effusion with slight left base atelectasis. Lungs elsewhere clear. Stable cardiac silhouette. There is aortic atherosclerosis. Electronically Signed   By: Lowella Grip III M.D.   On: 06/13/2016 17:19    Cardiac Studies   POD #1 echo pending  Patient Profile     83 y.o. male with severe symptomatic aortic stenosis, s/p transfemoral TAVR 06/13/2016  Assessment & Plan    1. Aortic valve disease s/p TAVR - progressing well. Mobilize, POD#1 echo, DC foley, A-line this am.  2. HTN, uncontrolled: resume oral antihypertensives, wean off IV NTG, DC a-line. Hold beta-blocker today with borderline heart rate and RBBB  3. Permanent atrial fibrillation: discussed Envisage TAVI-AF Trial. Pt would like to  consider this - discussed with research team. If he randomizes to warfarin will start today, if randomizes to edoxaban start tomorrow am  4. CAD, native vessel: no angina. Resume home Rx.  5. Thrombocytopenia - mild, expected post-op  Signed, Sherren Mocha, MD  06/14/2016, 8:30 AM

## 2016-06-14 NOTE — Progress Notes (Signed)
ANTICOAGULATION CONSULT NOTE - Initial Consult  Pharmacy Consult for warfarin Indication: atrial fibrillation  Allergies  Allergen Reactions  . No Known Allergies     Patient Measurements: Height: 5\' 8"  (172.7 cm) Weight: 188 lb 11.4 oz (85.6 kg) IBW/kg (Calculated) : 68.4 Heparin Dosing Weight: n/a  Vital Signs: Temp: 99.1 F (37.3 C) (05/02 1533) Temp Source: Oral (05/02 1149) BP: 172/66 (05/02 1533) Pulse Rate: 67 (05/02 1533)  Labs:  Recent Labs  06/13/16 1057  06/13/16 1509 06/13/16 1537 06/13/16 1649 06/13/16 1651 06/14/16 0325  HGB  --   < > 13.3 13.3 14.6 14.4 13.7  HCT  --   < > 39.0 39.0 43.0 42.7 40.6  PLT  --   --   --   --   --  89* 89*  APTT 35  --   --   --   --  39*  --   LABPROT 15.4*  --   --   --   --  14.5  --   INR 1.22  --   --   --   --  1.13  --   CREATININE  --   < > 0.60* 0.70  --   --  0.75  < > = values in this interval not displayed.  Estimated Creatinine Clearance: 68 mL/min (by C-G formula based on SCr of 0.75 mg/dL).   Medical History: Past Medical History:  Diagnosis Date  . AAA (abdominal aortic aneurysm) ([place])    s/p repair  . Aortic stenosis, mild    a. mean gradient 17 mmHg on 04/2012 TTE  . Arthritis    knees  . CAD (coronary artery disease)    a. s/p multiple percutaneous prior coronary artery interventions b. 05/2012: unsuccessful PCI to tight RCA->medically managed  . Cancer [facility])    skin cancer removed, right hand, left leg, chest  . Carotid artery disease ([place])    0-39% bilateral ICA stenoses 11/2011  . Chronic anticoagulation    Coumadin  . GERD (gastroesophageal reflux disease)   . GERD (gastroesophageal reflux disease)   . History of pulmonary embolism    1964 related to dislocated hip on right   . History of skin cancer   . Hypercholesteremia   . Hypertension   . Hypertension   . Incidental cecal mass noted on CT imaging 05/24/2016   **An incidental finding of potential clinical significance has been  found. 4.6 x 5.6 x 3.7 cm masslike lesion in the cecum adjacent to the ileocecal valve may represent a large polyp or colonic neoplasm. Correlation with nonemergent colonoscopy is strongly recommended in the near future to better evaluate this finding.**  . Macular degeneration    eye injections  . Microscopic hematuria    Followed by urology  . Permanent atrial fibrillation ([place])   . PVD (peripheral vascular disease) ([place])    Bilateral renal artery atherosclerosis, SMA stenosis with collateralization  . RBBB    Chronic  . 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 SSS (sick sinus syndrome) (HCC)     Medications:  Scheduled:  . amLODipine  5 mg Oral Daily  . aspirin EC  325 mg Oral Daily   Or  . aspirin  324 mg Per Tube Daily  . hydrochlorothiazide  12.5 mg Oral Daily  . isosorbide mononitrate  60 mg Oral Daily  . LORazepam  1 mg Oral  QHS  . mouth rinse  15 mL Mouth Rinse BID  . metoprolol succinate  25 mg Oral Daily  . [START ON 06/15/2016] pantoprazole  40 mg Oral Daily  . ramipril  10 mg Oral BID    Assessment: 83 yo male s/p TAVR yesterday.  On chronic warfarin for afib.  Pharmacy asked to resume warfarin dosing today.  PTA Coumadin dosage was 5 mg daily except 7.5 mg on Mondays.  Coumadin held in anticipation of procedure, and yesterday's INR 1.13.  Goal of Therapy:  INR 2-3 Monitor platelets by anticoagulation protocol: Yes   Plan:  1. Coumadin 7.5 mg x 1 tonight. 2. Daily PT/INR.  Uvaldo Rising, BCPS  Clinical Pharmacist Pager [phone]  06/14/2016 5:51 PM

## 2016-06-14 NOTE — Research (Signed)
Mr. Tyler Williams is eligible for the ENVISAGE Research Study. I discussed the study with him and provided him with a consent form.  I discussed with Dr. Burt Knack and he agrees with his participation.  [name] is going to read the ICF and discuss with his wife.  I will follow up with him later today.  Blossom Hoops, RN [phone]

## 2016-06-14 NOTE — Progress Notes (Signed)
Report given to [employer] on 2W.  Pt has no c/o pain or s/s of any acute distress noted.

## 2016-06-14 NOTE — Research (Signed)
Tyler Williams did not want to participate in the ENVISAGE Research Study. Dr. Burt Knack was notified.

## 2016-06-15 ENCOUNTER — Inpatient Hospital Stay (HOSPITAL_COMMUNITY): Payer: Medicare Other

## 2016-06-15 DIAGNOSIS — I10 Essential (primary) hypertension: Secondary | ICD-10-CM

## 2016-06-15 DIAGNOSIS — I350 Nonrheumatic aortic (valve) stenosis: Secondary | ICD-10-CM

## 2016-06-15 LAB — PROTIME-INR
INR: 1.43
Prothrombin Time: 17.6 seconds — ABNORMAL HIGH (ref 11.4–15.2)

## 2016-06-15 MED ORDER — ASPIRIN EC 81 MG PO TBEC
81.0000 mg | DELAYED_RELEASE_TABLET | Freq: Every day | ORAL | Status: DC
  Administered 2016-06-15: 81 mg via ORAL
  Filled 2016-06-15: qty 1

## 2016-06-15 MED ORDER — PANTOPRAZOLE SODIUM 40 MG PO TBEC
40.0000 mg | DELAYED_RELEASE_TABLET | Freq: Every day | ORAL | 6 refills | Status: DC
Start: 2016-06-16 — End: 2016-07-05

## 2016-06-15 MED ORDER — ASPIRIN 81 MG PO TBEC: 81.0000 mg | DELAYED_RELEASE_TABLET | Freq: Every day | ORAL | Status: DC

## 2016-06-15 MED ORDER — AMLODIPINE BESYLATE 10 MG PO TABS: 10.0000 mg | ORAL_TABLET | Freq: Every day | ORAL | 6 refills | Status: DC

## 2016-06-15 MED ORDER — AMLODIPINE BESYLATE 10 MG PO TABS
10.0000 mg | ORAL_TABLET | Freq: Every day | ORAL | Status: DC
  Administered 2016-06-15: 10 mg via ORAL
  Filled 2016-06-15: qty 1

## 2016-06-15 MED ORDER — WARFARIN SODIUM 7.5 MG PO TABS: 7.5000 mg | ORAL_TABLET | Freq: Once | ORAL | Status: DC

## 2016-06-15 MED ORDER — METOPROLOL SUCCINATE ER 25 MG PO TB24: 25.0000 mg | ORAL_TABLET | Freq: Every day | ORAL | 6 refills | Status: DC

## 2016-06-15 NOTE — Discharge Instructions (Signed)

## 2016-06-15 NOTE — Discharge Summary (Signed)
Discharge Summary    Patient ID: Tyler Williams,  MRN: [id number], DOB/AGE: 1926-09-04 83 y.o.  Admit date: 06/13/2016 Discharge date: 06/15/2016  Primary Care Provider: ARONSON,RICHARD A Primary Cardiologist: Burt Knack  Discharge Diagnoses    Principal Problem:   S/P TAVR (transcatheter aortic valve replacement) Active Problems:   HYPERTENSION, BENIGN   CAD, NATIVE VESSEL   Atrial fibrillation (HCC)   Aortic stenosis  Allergies Allergies  Allergen Reactions  . No Known Allergies     Diagnostic Studies/Procedures    06/13/16  Procedure:        Transcatheter Aortic Valve Replacement - Percutaneous Transfemoral Approach             Edwards Sapien 3 THV (size 29 mm, model # 9600TFX, serial # [id number])              Co-Surgeons:                        Valentina Gu. Roxy Manns, MD and Sherren Mocha, MD  Anesthesiologist:                  Laurie Panda, MD  Echocardiographer:              Ena Dawley, MD  Pre-operative Echo Findings: ? Severe aortic stenosis ? Low normal left ventricular systolic function  Post-operative Echo Findings: ? no paravalvular leak ? unchanged left ventricular systolic function  TTE: 04/14/47  Study Conclusions  - Left ventricle: The cavity size was normal. Wall thickness was   increased in a pattern of mild LVH. Systolic function was normal.   The estimated ejection fraction was in the range of 55% to 60%. - Aortic valve: AV prosthesis opens well. Peak velocity/gradient   through valve is 2 m/sec (16 mm Hg). Trace perivalvular AI There   was trivial regurgitation. - Mitral valve: There was mild regurgitation. - Left atrium: The atrium was severely dilated. - Right atrium: The atrium was severely dilated. - Pulmonary arteries: PA peak pressure: 50 mm Hg (S). _____________   History of Present Illness     Patient is an 83 year old male with history of aortic stenosis, coronary artery disease, long-standing persistent atrial  fibrillation on chronic warfarin anticoagulation, GE reflux disease, and abdominal aortic aneurysm status post open surgical repair in the distant past who has been referred for surgical consultation to discuss treatment options for management of severe symptomatic aortic stenosis. Patient has long history of heart murmur and aortic stenosis discovered nearly 20 years ago when he first presented with atrial fibrillation. He developed symptomatic coronary artery disease and underwent percutaneous coronary intervention on multiple occasions in the past. He has been followed for the last several years by Dr. Burt Knack. In 2014 he was hospitalized for unstable angina. Diagnostic cardiac catheterization revealed 99% stenosis of mid right coronary artery without any significant disease in the left coronary system. PCI was attempted but unsuccessful because of severe calcification and chronicity of disease. Since then the patient has done remarkably well on long-term medical therapy. Serial echocardiograms have documented a gradual progression severity of aortic stenosis. The patient recently developed fairly sudden onset of exertional shortness of breath without chest discomfort. He has remained remarkably active physically, but recently when on a walk he experienced a prolonged episode of shortness of breath that developed when he was trying to walk up a hill. He recovered fairly quickly with rest. Since then he has noticed decreased energy and intermittent episodes  of exertional shortness of breath. He does not get short of breath with ordinary activities around the house and he has never had any resting shortness of breath or chest discomfort. He has chronic history of lower extremity edema that is at least partially related to degenerative arthritis involving both knees with previous bilateral knee surgery. He was seen in follow-up recently by Dr. Burt Knack following the onset of symptoms of exertional shortness of  breath. He underwent repeat diagnostic cardiac catheterization on 05/12/2016 revealing severe stenosis of the mid right coronary artery with heavy calcification, unchanged from the last previous diagnostic catheterization performed in 2014. There remained only minor nonobstructive coronary artery disease in the left coronary circulation. There was severe aortic stenosis with mean transvalvular gradient measured 35 mmHg by catheterization. The patient subsequently underwent follow-up echocardiogram 05/18/2016 which confirmed the presence of severe aortic stenosis with normal left ventricular systolic function. The patient subsequently underwent CT angiography and has been referred for elective surgical consultation. Given his symptoms and work up, he was planned for TAVR.   Hospital Course     Consultants: CVTS   He presented for TAVR 06/13/16 and underwent successful Edwards Sapien THV (size 29 mm). He did well post op. Foley, arterial line were dc'ed on 06/14/16. He was hypertensive post op and placed on IV nitro. He was resumed on his oral antihypertensives with improvement. His BB was held in the setting of borderline bradycardia and RBBB. His home amlodipine was increased to 10mg  with improvement in blood pressure. The TAVI-AF trial was discussed with him, but he declined. He was resumed on Coumadin/ASA given his chronic AF. He had mild thrombocytopenia post procedure which was expected. He was transferred to telemetry on 06/14/16. Follow up echo showed stable valve with trace perivalvular AI and gradient of 16 mmHg.   He was seen by Dr. Burt Knack and determined stable for discharge home. Follow up in the office was arranged. Medications are listed below.  _____________  Discharge Vitals Blood pressure (!) 127/54, pulse 61, temperature 98 F (36.7 C), temperature source Oral, resp. rate 18, height 5\' 8"  (1.727 m), weight 188 lb 11.4 oz (85.6 kg), SpO2 96 %.  Filed Weights   06/13/16 1137 06/14/16 0500  06/15/16 0545  Weight: 183 lb (83 kg) 188 lb 11.4 oz (85.6 kg) 188 lb 11.4 oz (85.6 kg)    Labs & Radiologic Studies    CBC  Recent Labs  06/13/16 1651 06/14/16 0325  WBC 6.3 7.9  HGB 14.4 13.7  HCT 42.7 40.6  MCV 96.6 95.8  PLT 89* 89*   Basic Metabolic Panel  Recent Labs  06/13/16 1537 06/13/16 1649 06/14/16 0325  NA 140 142 136  K 4.0 4.2 4.0  CL 105  --  106  CO2  --   --  24  GLUCOSE 102* 98 105*  BUN 14  --  13  CREATININE 0.70  --  0.75  CALCIUM  --   --  8.3*  MG  --   --  2.0   Liver Function Tests No results for input(s): AST, ALT, ALKPHOS, BILITOT, PROT, ALBUMIN in the last 72 hours. No results for input(s): LIPASE, AMYLASE in the last 72 hours. Cardiac Enzymes No results for input(s): CKTOTAL, CKMB, CKMBINDEX, TROPONINI in the last 72 hours. BNP Invalid input(s): POCBNP D-Dimer No results for input(s): DDIMER in the last 72 hours. Hemoglobin A1C No results for input(s): HGBA1C in the last 72 hours. Fasting Lipid Panel No results for input(s): CHOL,  HDL, LDLCALC, TRIG, CHOLHDL, LDLDIRECT in the last 72 hours. Thyroid Function Tests No results for input(s): TSH, T4TOTAL, T3FREE, THYROIDAB in the last 72 hours.  Invalid input(s): FREET3 _____________   Dg Chest Port 1 View  Result Date: 06/14/2016 CLINICAL DATA:  Chest soreness following transcatheter aortic valve replacement. EXAM: PORTABLE CHEST 1 VIEW COMPARISON:  Portable chest x-ray of Jun 13, 2016 FINDINGS: The lungs are adequately inflated. The interstitial markings are more prominent today. The cardiac silhouette remains enlarged and its margins are less distinct. The pulmonary vascularity is mildly engorged. There is calcification in the wall of the aortic arch. The right subclavian venous catheter tip projects over the midportion of the SVC. IMPRESSION: Mild hypoinflation today. CHF with mild interstitial edema more conspicuous today. No alveolar pneumonia. Thoracic aortic atherosclerosis.  Electronically Signed   By: David  Martinique M.D.   On: 06/14/2016 07:43   Dg Chest Port 1 View  Result Date: 06/13/2016 CLINICAL DATA:  Atelectasis. EXAM: PORTABLE CHEST 1 VIEW COMPARISON:  June 09, 2016 FINDINGS: Central catheter tip is in the superior vena cava at the level of the carina. No pneumothorax. There is a minimal left pleural effusion with slight left base atelectasis. Lungs elsewhere are clear. Heart is slightly enlarged with pulmonary vascularity within normal limits. There is an aortic valve replacement. There is aortic atherosclerosis. No adenopathy. No bone lesions. IMPRESSION: Central catheter as described without pneumothorax. Minimal left pleural effusion with slight left base atelectasis. Lungs elsewhere clear. Stable cardiac silhouette. There is aortic atherosclerosis. Electronically Signed   By: Lowella Grip III M.D.   On: 06/13/2016 17:19     Disposition   Pt is being discharged home today in good condition.  Follow-up Plans & Appointments    Follow-up Information    [name], PA-C Follow up on 06/23/2016.   Specialties:  Cardiology, Physician Assistant Why:  at 11:15am for your follow up appt.  Contact information: [address] [address] [phone]        [facility] Follow up on 06/22/2016.   Specialty:  Cardiology Why:  at 11:30am for your INR check Contact information: [address] [place] [phone]         Discharge Instructions    Amb Referral to Cardiac Rehabilitation    Complete by:  As directed    Diagnosis:  Valve Replacement   Valve:  Aortic Comment - TAVR   Call MD for:  redness, tenderness, or signs of infection (pain, swelling, redness, odor or green/yellow discharge around incision site)    Complete by:  As directed    Diet - low sodium heart healthy    Complete by:  As directed    Discharge instructions    Complete by:  As  directed    ACTIVITY AND EXERCISE . Daily activity and exercise are an important part of your recovery. People recover at different rates depending on their general health and type of valve procedure. . Most people require six to 10 weeks to feel recovered. . No lifting, pushing, pulling more than 10 pounds (examples to avoid: groceries, vacuuming, gardening, golfing):  - For one week with a procedure through the groin.  - For six weeks for procedures through the chest wall.  - For three months for procedures through the breast-bone. NOTE: You will typically see one of our providers 7-10 days after your procedure to discuss [place] the above activities.  DRIVING . Do not drive for four weeks after the date of your procedure. . If you have been told by your doctor in the past that you may not drive, you must talk with him/her before you begin driving again. . When you resume driving, you must have someone with you.   DRESSING . Groin site: you may leave the clear dressing over the site for up to one week or until it falls off.   HYGIENE . If you had a femoral (leg) procedure, you may take a shower when you return home. After the shower, pat the site dry. Do NOT use powder, oils or lotions in your groin area until the site has completely healed. . If you had a chest procedure, you may shower when you return home unless specifically instructed not to by your discharging practitioner.  - DO NOT scrub incision; pat dry with a towel  - DO NOT apply any lotions, oils, powders to the incision  - No tub baths / swimming for at least six weeks. . If you notice any fevers, chills, increased pain, swelling, bleeding or pus, please contact your doctor.  ADDITIONAL INFORMATION . If you are going to have an upcoming dental procedure, please contact our office as you may require antibiotics ahead of time to prevent infection on your heart valve.   Increase activity slowly    Complete by:  As  directed       Discharge Medications   Current Discharge Medication List    START taking these medications   Details  aspirin EC 81 MG EC tablet Take 1 tablet (81 mg total) by mouth daily.    pantoprazole (PROTONIX) 40 MG tablet Take 1 tablet (40 mg total) by mouth daily. Qty: 30 tablet, Refills: 6      CONTINUE these medications which have CHANGED   Details  amLODipine (NORVASC) 10 MG tablet Take 1 tablet (10 mg total) by mouth daily. Qty: 30 tablet, Refills: 6    metoprolol succinate (TOPROL-XL) 25 MG 24 hr tablet Take 1 tablet (25 mg total) by mouth daily. Qty: 30 tablet, Refills: 6      CONTINUE these medications which have NOT CHANGED   Details  atorvastatin (LIPITOR) 20 MG tablet TAKE 1 TABLET ONCE DAILY. Qty: 30 tablet, Refills: 11    Calcium-Magnesium-Zinc (CAL-MAG-ZINC PO) Take 1 tablet by mouth daily.    Cholecalciferol (VITAMIN D-3) 5000 UNITS TABS Take 5,000 Units by mouth daily.     Coenzyme Q10 (CO Q 10) 100 MG CAPS Take 1 capsule by mouth every morning.     hydrochlorothiazide (MICROZIDE) 12.5 MG capsule TAKE (1) CAPSULE DAILY. Qty: 30 capsule, Refills: 11    ibuprofen (ADVIL,MOTRIN) 200 MG tablet Take 400 mg by mouth every 6 (six) hours as needed for pain.    isosorbide mononitrate (IMDUR) 60 MG 24 hr tablet TAKE 1 TABLET EACH DAY. Qty: 30 tablet, Refills: 11    LORazepam (ATIVAN) 1 MG tablet Take 1 mg by mouth at bedtime.     multivitamin-lutein (OCUVITE-LUTEIN) CAPS capsule Take 1 capsule by mouth daily.    omega-3 acid ethyl esters (LOVAZA) 1 G capsule Take 1 g by mouth daily.    Propylene Glycol (SYSTANE BALANCE OP) Place 1 drop into both eyes daily.    ramipril (ALTACE) 10 MG capsule Take 10 mg by mouth 2 (two) times daily.    Ranibizumab (LUCENTIS) 0.5 MG/0.05ML SOLN Inject 0.5 mg into the eye See admin instructions. Pt gets this injection  every 9 weeks. Next treatment is due on 01-18-12. Pt is followed by [facility].  Gets 1  shot at a time, one week apart.    solifenacin (VESICARE) 5 MG tablet Take 5 mg by mouth daily.    warfarin (COUMADIN) 5 MG tablet Take 5-7.5 mg by mouth See admin instructions. 7.5 mg on Mondays. 5 mg all other days    nitroGLYCERIN (NITROSTAT) 0.4 MG SL tablet Place 1 tablet (0.4 mg total) under the tongue every 5 (five) minutes x 3 doses as needed for chest pain. Qty: 30 tablet, Refills: 4         Outstanding Labs/Studies   Follow up echo in 1 month.   Duration of Discharge Encounter   Greater than 30 minutes including physician time.  Signed, Reino Bellis NP-C 06/15/2016, 4:10 PM

## 2016-06-15 NOTE — Progress Notes (Signed)
ANTICOAGULATION CONSULT NOTE - Initial Consult  Pharmacy Consult for warfarin Indication: atrial fibrillation  Allergies  Allergen Reactions  . No Known Allergies     Patient Measurements: Height: 5\' 8"  (172.7 cm) Weight: 188 lb 11.4 oz (85.6 kg) IBW/kg (Calculated) : 68.4 Heparin Dosing Weight: n/a  Vital Signs: Temp: 98.6 F (37 C) (05/03 0545) Temp Source: Oral (05/03 0545) BP: 175/68 (05/03 0957) Pulse Rate: 58 (05/03 0545)  Labs:  Recent Labs  06/13/16 1057  06/13/16 1509 06/13/16 1537 06/13/16 1649 06/13/16 1651 06/14/16 0325 06/15/16 0210  HGB  --   < > 13.3 13.3 14.6 14.4 13.7  --   HCT  --   < > 39.0 39.0 43.0 42.7 40.6  --   PLT  --   --   --   --   --  89* 89*  --   APTT 35  --   --   --   --  39*  --   --   LABPROT 15.4*  --   --   --   --  14.5  --  17.6*  INR 1.22  --   --   --   --  1.13  --  1.43  CREATININE  --   < > 0.60* 0.70  --   --  0.75  --   < > = values in this interval not displayed.  Estimated Creatinine Clearance: 68 mL/min (by C-G formula based on SCr of 0.75 mg/dL).  Assessment: 83 yo male s/p TAVR 2 days ago.  On chronic warfarin for afib.    Anticoag: warfarin PTA (7.5 mg on M - 5 mg AOD), currently on hold s/p TAVR on 5/1 Restarted 5/2  INR now 1.43  Heme/Onc: Hgb 13.7, Plts 89 (5/2)  Goal of Therapy:  INR 2-3 Monitor platelets by anticoagulation protocol: Yes   Plan:  Warfarin 7.5 mg x 1 Daily INR, CBC To dc today? - can likely resume home regimen with f/u early next week  Levester Fresh, PharmD, BCPS, BCCCP Clinical Pharmacist Clinical phone for 06/15/2016 from 7a-3:30p: [phone] If after 3:30p, please call main pharmacy at: [phone] 06/15/2016 10:00 AM

## 2016-06-15 NOTE — Progress Notes (Addendum)
      [address]       [place] [address]             [phone]      2 Days Post-Op Procedure(s) (LRB): TRANSCATHETER AORTIC VALVE REPLACEMENT, TRANSFEMORAL (N/A) TRANSESOPHAGEAL ECHOCARDIOGRAM (TEE) (N/A)   Subjective:  No complaints, ready to go home.  For Echo today.  Objective: Vital signs in last 24 hours: Temp:  [98.4 F (36.9 C)-99.1 F (37.3 C)] 98.6 F (37 C) (05/03 0545) Pulse Rate:  [58-77] 58 (05/03 0545) Cardiac Rhythm: Atrial fibrillation;Bundle branch block (05/03 0700) Resp:  [9-23] 18 (05/03 0545) BP: (137-183)/(54-80) 137/54 (05/03 0545) SpO2:  [93 %-98 %] 94 % (05/03 0545) Arterial Line BP: (186-202)/(47-53) 186/47 (05/02 1030) Weight:  [188 lb 11.4 oz (85.6 kg)] 188 lb 11.4 oz (85.6 kg) (05/03 0545)  Intake/Output from previous day: 05/02 0701 - 05/03 0700 In: 453.2 [P.O.:240; I.V.:43.2; IV Piggyback:100] Out: 550 [Urine:550]  General appearance: alert, cooperative and no distress Heart: irregularly irregular rhythm Lungs: clear to auscultation bilaterally Abdomen: soft, non-tender; bowel sounds normal; no masses,  no organomegaly Extremities: + venous stasis changes, + spider telengectasias Wound: clean and dry  Lab Results:  Recent Labs  06/13/16 1651 06/14/16 0325  WBC 6.3 7.9  HGB 14.4 13.7  HCT 42.7 40.6  PLT 89* 89*   BMET:  Recent Labs  06/13/16 1537 06/13/16 1649 06/14/16 0325  NA 140 142 136  K 4.0 4.2 4.0  CL 105  --  106  CO2  --   --  24  GLUCOSE 102* 98 105*  BUN 14  --  13  CREATININE 0.70  --  0.75  CALCIUM  --   --  8.3*    PT/INR:  Recent Labs  06/15/16 0210  LABPROT 17.6*  INR 1.43   ABG    Component Value Date/Time   PHART 7.347 (L) 06/13/2016 1649   HCO3 22.5 06/13/2016 1649   TCO2 24 06/13/2016 1649   ACIDBASEDEF 3.0 (H) 06/13/2016 1649   O2SAT 95.0 06/13/2016 1649   CBG (last 3)  No results for input(s): GLUCAP in the last 72 hours.  Assessment/Plan: S/P Procedure(s)  (LRB): TRANSCATHETER AORTIC VALVE REPLACEMENT, TRANSFEMORAL (N/A) TRANSESOPHAGEAL ECHOCARDIOGRAM (TEE) (N/A)  1. CV- S/P TAVR, A. Fib, HTN-on Toprol, altace, Norvasc, HCTZ, Imdur 2. INR 1.43, continue home coumadin regimen 3. Dispo- patient stable, chronic A. Fib on coumadin, for ECHO today, likely home later today per Cardiology   LOS: 2 days    BARRETT, ERIN 06/15/2016   I have seen and examined the patient and agree with the assessment and plan as outlined.  Reduce dose of Toprol XL due to borderline slow HR.  Okay for d/c home today.  Rexene Alberts, MD 06/15/2016 8:54 AM

## 2016-06-15 NOTE — Progress Notes (Signed)
CARDIAC REHAB PHASE I   PRE:  Rate/Rhythm: 52 afib   Up in hall with rolling walker  MODE:  Ambulation: 300 ft   POST:  Rate/Rhythm: 63  BP:  Supine:   Sitting: 174/72  Standing:    SaO2: 98%RA 1030-1108 Pt up in hall independently with rolling walker with steady gait. Has walker at home if needed. Tolerated well. Discussed CRP 2 and will refer to [place]. Pt stated he did program in 1980's. Encouraged pt to watch sodium. Normally tries to eat fairly healthy. Gave ex ed as pt walks for ex. And is still active. Encouraged IS.   Graylon Good, RN BSN  06/15/2016 11:00 AM

## 2016-06-15 NOTE — Progress Notes (Signed)
Pt. Discharged to home  Pt. D/C'd wheelchair with SWOT RN Discharge information reviewed and given with teach back  All personal belongings given to Pt.  Education discussed CVC and PIV was d/c Tele d/c

## 2016-06-15 NOTE — Progress Notes (Signed)
Progress Note  Patient Name: Tyler Williams Date of Encounter: 06/15/2016  Primary Cardiologist: Burt Knack  Subjective   Feeling well. Slept well last night. No CP or dyspnea. Eager to go home.    Inpatient Medications    Scheduled Meds: . amLODipine  5 mg Oral Daily  . aspirin EC  325 mg Oral Daily   Or  . aspirin  324 mg Per Tube Daily  . hydrochlorothiazide  12.5 mg Oral Daily  . isosorbide mononitrate  60 mg Oral Daily  . LORazepam  1 mg Oral QHS  . mouth rinse  15 mL Mouth Rinse BID  . metoprolol succinate  25 mg Oral Daily  . pantoprazole  40 mg Oral Daily  . ramipril  10 mg Oral BID  . Warfarin - Pharmacist Dosing Inpatient   Does not apply q1800   Continuous Infusions: . cefUROXime (ZINACEF)  IV 1.5 g (06/15/16 0718)  . phenylephrine (NEO-SYNEPHRINE) Adult infusion Stopped (06/13/16 1700)  . potassium chloride (KCL MULTIRUN) 30 mEq in 265 mL IVPB     PRN Meds: acetaminophen, metoprolol, midazolam, morphine injection, ondansetron (ZOFRAN) IV, oxyCODONE, traMADol   Vital Signs    Vitals:   06/14/16 1400 06/14/16 1533 06/14/16 2121 06/15/16 0545  BP: (!) 163/68 (!) 172/66 (!) 183/64 (!) 137/54  Pulse: 68 67 67 (!) 58  Resp: 17 18 18 18   Temp:  99.1 F (37.3 C) 98.5 F (36.9 C) 98.6 F (37 C)  TempSrc:   Oral Oral  SpO2: 97% 97% 96% 94%  Weight:    188 lb 11.4 oz (85.6 kg)  Height:        Intake/Output Summary (Last 24 hours) at 06/15/16 0727 Last data filed at 06/14/16 1630  Gross per 24 hour  Intake           453.15 ml  Output              550 ml  Net           -96.85 ml   Filed Weights   06/13/16 1137 06/14/16 0500 06/15/16 0545  Weight: 183 lb (83 kg) 188 lb 11.4 oz (85.6 kg) 188 lb 11.4 oz (85.6 kg)    Telemetry    Atrial fibrillation, HR 60's, no pauses > 3 sec - Personally Reviewed  Physical Exam  Pleasant elderly male, standing up at sink, NAD GEN: No acute distress.   Neck: No JVD Cardiac: irregular, 2/6 SEM at the RUSB, no  diastolic murmur Respiratory: Clear to auscultation bilaterally. GI: Soft, nontender, non-distended  MS: no edema, none on left; No deformity. BL groin sites clear Neuro:  Nonfocal  Psych: Normal affect   Labs    Chemistry Recent Labs Lab 06/09/16 1037  06/13/16 1509 06/13/16 1537 06/13/16 1649 06/14/16 0325  NA 137  < > 140 140 142 136  K 4.2  < > 3.9 4.0 4.2 4.0  CL 108  < > 105 105  --  106  CO2 21*  --   --   --   --  24  GLUCOSE 88  < > 102* 102* 98 105*  BUN 18  < > 14 14  --  13  CREATININE 0.82  < > 0.60* 0.70  --  0.75  CALCIUM 9.2  --   --   --   --  8.3*  PROT 7.2  --   --   --   --   --   ALBUMIN 3.7  --   --   --   --   --  AST 25  --   --   --   --   --   ALT 19  --   --   --   --   --   ALKPHOS 75  --   --   --   --   --   BILITOT 1.4*  --   --   --   --   --   GFRNONAA >60  --   --   --   --  >60  GFRAA >60  --   --   --   --  >60  ANIONGAP 8  --   --   --   --  6  < > = values in this interval not displayed.   Hematology Recent Labs Lab 06/09/16 1037  06/13/16 1649 06/13/16 1651 06/14/16 0325  WBC 6.8  --   --  6.3 7.9  RBC 4.80  --   --  4.42 4.24  HGB 15.9  < > 14.6 14.4 13.7  HCT 46.2  < > 43.0 42.7 40.6  MCV 96.3  --   --  96.6 95.8  MCH 33.1  --   --  32.6 32.3  MCHC 34.4  --   --  33.7 33.7  RDW 13.8  --   --  13.6 13.6  PLT 99*  --   --  89* 89*  < > = values in this interval not displayed.  Cardiac EnzymesNo results for input(s): TROPONINI in the last 168 hours. No results for input(s): TROPIPOC in the last 168 hours.   BNPNo results for input(s): BNP, PROBNP in the last 168 hours.   DDimer No results for input(s): DDIMER in the last 168 hours.   Radiology    Dg Chest Port 1 View  Result Date: 06/14/2016 CLINICAL DATA:  Chest soreness following transcatheter aortic valve replacement. EXAM: PORTABLE CHEST 1 VIEW COMPARISON:  Portable chest x-ray of Jun 13, 2016 FINDINGS: The lungs are adequately inflated. The interstitial markings  are more prominent today. The cardiac silhouette remains enlarged and its margins are less distinct. The pulmonary vascularity is mildly engorged. There is calcification in the wall of the aortic arch. The right subclavian venous catheter tip projects over the midportion of the SVC. IMPRESSION: Mild hypoinflation today. CHF with mild interstitial edema more conspicuous today. No alveolar pneumonia. Thoracic aortic atherosclerosis. Electronically Signed   By: David  Martinique M.D.   On: 06/14/2016 07:43   Dg Chest Port 1 View  Result Date: 06/13/2016 CLINICAL DATA:  Atelectasis. EXAM: PORTABLE CHEST 1 VIEW COMPARISON:  June 09, 2016 FINDINGS: Central catheter tip is in the superior vena cava at the level of the carina. No pneumothorax. There is a minimal left pleural effusion with slight left base atelectasis. Lungs elsewhere are clear. Heart is slightly enlarged with pulmonary vascularity within normal limits. There is an aortic valve replacement. There is aortic atherosclerosis. No adenopathy. No bone lesions. IMPRESSION: Central catheter as described without pneumothorax. Minimal left pleural effusion with slight left base atelectasis. Lungs elsewhere clear. Stable cardiac silhouette. There is aortic atherosclerosis. Electronically Signed   By: Lowella Grip III M.D.   On: 06/13/2016 17:19    Cardiac Studies   POD #1 echo pending - scheduled for today  Patient Profile     83 y.o. male with severe symptomatic aortic stenosis, s/p transfemoral TAVR 06/13/2016  Assessment & Plan    1. Aortic valve disease s/p TAVR - progressing well. 2D Echo today.  2. HTN, uncontrolled: Back on oral regimen. BP remains elevated. Increase amlodipine to 10 mg.  3. Permanent atrial fibrillation: warfarin resumed. Pt declined TAVI-AF Trial. Rate controlled and stable - ventricular rate is slow at baseline. Reduce Toprol XL to 25 mg daily  4. CAD, native vessel: no angina. .  5. Thrombocytopenia - mild, expected  post-op  Signed, Sherren Mocha, MD  06/15/2016, 7:27 AM

## 2016-06-15 NOTE — Consult Note (Signed)
[facility] CM Primary Care Navigator  06/15/2016  Tyler Williams November 27, 1926 [id number]   Met with patient at the bedside to identify possible discharge needs. Patient reports having exertional shortness of breath and increased fatigue especially with walking thathad led to this admission/ surgery. Patient endorses Tyler Williams with [facility] as his primary care provider.   Patient shared using [employer] at [employer] obtain medications without difficulty.  Patient states that he manages hisown medications at home straight out of the containers using his organizing system.   He reports that son Tyler Williams) or daughters Tyler Williams and Tyler Williams) are able to provide transportation to his doctors'appointments.  Patient's wife Tyler Williams) is his primary caregiver at home but children provide assistance with care as needed.  Anticipated discharge plan is home according to patient.   Patient voiced understanding to call primary care provider's office when he gets home, for a post discharge follow-up appointment within a week or sooner if needs arise.Patient letter (with PCP's contact number) was provided as theirreminder.  Patient communicated nofurther needs or concerns at this time.   For questions, please contact:  Dannielle Huh, BSN, RN- [facility] Primary Care Navigator  Telephone: [phone] [place]

## 2016-06-19 ENCOUNTER — Telehealth (HOSPITAL_COMMUNITY): Payer: Self-pay | Admitting: Internal Medicine

## 2016-06-19 NOTE — Telephone Encounter (Signed)
Verified Medicare A/B and Box benefits through Passport Reference [phone] & [phone].Marland Kitchen... KJ

## 2016-06-21 MED FILL — Dextrose Inj 5%: INTRAVENOUS | Qty: 250 | Status: AC

## 2016-06-21 MED FILL — Potassium Chloride Inj 2 mEq/ML: INTRAVENOUS | Qty: 40 | Status: AC

## 2016-06-21 MED FILL — Heparin Sodium (Porcine) Inj 1000 Unit/ML: INTRAMUSCULAR | Qty: 30 | Status: AC

## 2016-06-21 MED FILL — Phenylephrine HCl Inj 10 MG/ML: INTRAMUSCULAR | Qty: 2 | Status: AC

## 2016-06-21 MED FILL — Insulin Regular (Human) Inj 100 Unit/ML: INTRAMUSCULAR | Qty: 100 | Status: AC

## 2016-06-21 MED FILL — Magnesium Sulfate Inj 50%: INTRAMUSCULAR | Qty: 10 | Status: AC

## 2016-06-22 ENCOUNTER — Ambulatory Visit (INDEPENDENT_AMBULATORY_CARE_PROVIDER_SITE_OTHER): Payer: Medicare Other | Admitting: *Deleted

## 2016-06-22 DIAGNOSIS — I4891 Unspecified atrial fibrillation: Secondary | ICD-10-CM

## 2016-06-22 DIAGNOSIS — I6523 Occlusion and stenosis of bilateral carotid arteries: Secondary | ICD-10-CM

## 2016-06-22 DIAGNOSIS — Z5181 Encounter for therapeutic drug level monitoring: Secondary | ICD-10-CM | POA: Diagnosis not present

## 2016-06-22 DIAGNOSIS — H353231 Exudative age-related macular degeneration, bilateral, with active choroidal neovascularization: Secondary | ICD-10-CM | POA: Diagnosis not present

## 2016-06-22 LAB — POCT INR: INR: 1.8

## 2016-06-23 ENCOUNTER — Ambulatory Visit (INDEPENDENT_AMBULATORY_CARE_PROVIDER_SITE_OTHER): Payer: Medicare Other | Admitting: Physician Assistant

## 2016-06-23 ENCOUNTER — Encounter: Payer: Self-pay | Admitting: Physician Assistant

## 2016-06-23 VITALS — BP 132/62 | HR 51 | Ht 68.0 in | Wt 184.0 lb

## 2016-06-23 DIAGNOSIS — I4821 Permanent atrial fibrillation: Secondary | ICD-10-CM

## 2016-06-23 DIAGNOSIS — I25119 Atherosclerotic heart disease of native coronary artery with unspecified angina pectoris: Secondary | ICD-10-CM

## 2016-06-23 DIAGNOSIS — I482 Chronic atrial fibrillation: Secondary | ICD-10-CM

## 2016-06-23 DIAGNOSIS — Z952 Presence of prosthetic heart valve: Secondary | ICD-10-CM | POA: Diagnosis not present

## 2016-06-23 DIAGNOSIS — I209 Angina pectoris, unspecified: Secondary | ICD-10-CM

## 2016-06-23 DIAGNOSIS — I6523 Occlusion and stenosis of bilateral carotid arteries: Secondary | ICD-10-CM

## 2016-06-23 DIAGNOSIS — I10 Essential (primary) hypertension: Secondary | ICD-10-CM

## 2016-06-23 NOTE — Patient Instructions (Signed)
Medication Instructions:   Your physician recommends that you continue on your current medications as directed. Please refer to the Current Medication list given to you today.   If you need a refill on your cardiac medications before your next appointment, please call your pharmacy.  Labwork: NONE ORDERED  TODAY    Testing/Procedures: NONE ORDERED  TODAY    Follow-Up:   KEEP APPOINTMENT AS SCHEDULED    Any Other Special Instructions Will Be Listed Below (If Applicable).                                                                                                                                                   

## 2016-06-23 NOTE — Progress Notes (Signed)
Cardiology Office Note:    Date:  06/23/2016   ID:  Briscoe Burns, DOB October 29, 1926, MRN [id number]  PCP:  Burnard Bunting, MD  Cardiologist:  Dr. Sherren Mocha    Referring MD: Burnard Bunting, MD   Chief Complaint  Patient presents with  . Hospitalization Follow-up    s/p TAVR    History of Present Illness:    Tyler Williams is a 83 y.o. male with a hx of Pronator fibrillation on chronic Coumadin anticoagulation, CAD status post angioplasty in the 1980s and 1990s, PAD, AAA status post repair, aortic stenosis. He was admitted in 4/14 with unstable angina. He had a 99% lesion in the mid RCA. PCI was unsuccessful and medical therapy was continued. He recently was noted to have worsening dyspnea on exertion. Cardiac catheterization demonstrated continued high-grade stenosis in the mid RCA with no significant disease in the LAD or LCx. Aortic valve gradient was 35. Echocardiogram confirmed severe aortic stenosis. He was evaluated in the  multi disciplinary valve clinic and set up for TAVR.   He was admitted 5/1-5/3. He underwent TAVR via transfemoral approach with Dr. Roxy Manns and Dr. Burt Knack. Postoperative echo demonstrated stable TAVR with trace perivalvular regurgitation. He required medication adjustments for blood pressure control postoperatively. Coumadin was resumed. He declined participation in the TAVI-AF trial.   He returns for post hospital section follow-up.  He is here alone.  Since DC, he has done well. He denies chest pain, significant dyspnea on exertion, orthopnea, PND.  He has ankle edema without change.  He denies syncope.  He denies bleeding issues.   Prior CV studies:   The following studies were reviewed today:  Echo 06/15/16 Mild LVH, EF 55-60, TAVR with trace perivalvular AI and peak gradient 16, mild MR, severe BAE, PASP 50  LHC 05/12/16 LAD mid 25 RI irregularities LCx mid 30 RCA proximal 40, mid 95, distal 40 Mean AV gradient 35, AVA 1.06 cm  Carotid US  10/17 R 40-59; L1-39 >> follow-up 1 year  Past Medical History:  Diagnosis Date  . AAA (abdominal aortic aneurysm) ([place])    s/p repair  . Aortic stenosis, mild    a. mean gradient 17 mmHg on 04/2012 TTE  . Arthritis    knees  . CAD (coronary artery disease)    a. s/p multiple percutaneous prior coronary artery interventions b. 05/2012: unsuccessful PCI to tight RCA->medically managed  . Cancer [facility])    skin cancer removed, right hand, left leg, chest  . Carotid artery disease ([place])    0-39% bilateral ICA stenoses 11/2011  . Chronic anticoagulation    Coumadin  . GERD (gastroesophageal reflux disease)   . GERD (gastroesophageal reflux disease)   . History of pulmonary embolism    1964 related to dislocated hip on right   . History of skin cancer   . Hypercholesteremia   . Hypertension   . Hypertension   . Incidental cecal mass noted on CT imaging 05/24/2016   **An incidental finding of potential clinical significance has been found. 4.6 x 5.6 x 3.7 cm masslike lesion in the cecum adjacent to the ileocecal valve may represent a large polyp or colonic neoplasm. Correlation with nonemergent colonoscopy is strongly recommended in the near future to better evaluate this finding.**  . Macular degeneration    eye injections  . Microscopic hematuria    Followed by urology  . Permanent atrial fibrillation ([place])   . PVD (peripheral vascular disease) ([place])    Bilateral renal artery  atherosclerosis, SMA stenosis with collateralization  . RBBB    Chronic  . 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 SSS (sick sinus syndrome) [facility])     Past Surgical History:  Procedure Laterality Date  . ABDOMINAL AORTIC ANEURYSM REPAIR     1993   . CARDIAC CATHETERIZATION  05/24/2012   pD1 20%, oCFX 20%, mCFX 30%, ostial Int Br 30%, distal AV groove CFX 30%, pRCA 30-40%, mRCA 99%,  dRCA 30%, PL branch small with diffuse 40%. Unable to pass wire past RCA lesion->medically managed  . CHOLECYSTECTOMY N/A 12/05/2012   Procedure: LAPAROSCOPIC CHOLECYSTECTOMY ;  Surgeon: Edward Jolly, MD;  Location: [place];  Service: General;  Laterality: N/A;  . [place]  . EYE SURGERY     hx of cataract surgery   . [place]  . HERNIA REPAIR     right inguinal hernia repair   . JOINT REPLACEMENT     left knee replacement, partial right  . LEFT HEART CATHETERIZATION WITH CORONARY ANGIOGRAM N/A 05/24/2012   Procedure: LEFT HEART CATHETERIZATION WITH CORONARY ANGIOGRAM;  Surgeon: Burnell Blanks, MD;  Location: [facility] CATH LAB;  Service: Cardiovascular;  Laterality: N/A;  . ORTHOPEDIC SURGERY    . OTHER SURGICAL HISTORY     right knee popliteal aneurysm surgery stent placed   . OTHER SURGICAL HISTORY    . PARTIAL KNEE ARTHROPLASTY  01/22/2012   Procedure: UNICOMPARTMENTAL KNEE;  Surgeon: Mauri Pole, MD;  [facility]: [facility];  Service: Orthopedics;  Laterality: Right;  . PERCUTANEOUS CORONARY INTERVENTION-BALLOON ONLY  05/24/2012   Procedure: PERCUTANEOUS CORONARY INTERVENTION-BALLOON ONLY;  Surgeon: Burnell Blanks, MD;  Location: [facility] CATH LAB;  Service: Cardiovascular;;  . RIGHT/LEFT HEART CATH AND CORONARY ANGIOGRAPHY N/A 05/12/2016   Procedure: Right/Left Heart Cath and Coronary Angiography;  Surgeon: Sherren Mocha, MD;  Location: [place] CV LAB;  Service: Cardiovascular;  Laterality: N/A;  . TEE WITHOUT CARDIOVERSION N/A 06/13/2016   Procedure: TRANSESOPHAGEAL ECHOCARDIOGRAM (TEE);  Surgeon: Sherren Mocha, MD;  Location: [place];  Service: Open Heart Surgery;  Laterality: N/A;  . TONSILLECTOMY    . TRANSCATHETER AORTIC VALVE REPLACEMENT, TRANSFEMORAL N/A 06/13/2016   Procedure: TRANSCATHETER AORTIC VALVE REPLACEMENT, TRANSFEMORAL;  Surgeon: Sherren Mocha, MD;  Location: [place];  Service: Open Heart Surgery;  Laterality: N/A;     Current Medications: Current Meds  Medication Sig  . amLODipine (NORVASC) 10 MG tablet Take 1 tablet (10 mg total) by mouth daily.  Marland Kitchen aspirin EC 81 MG EC tablet Take 1 tablet (81 mg total) by mouth daily.  Marland Kitchen atorvastatin (LIPITOR) 20 MG tablet TAKE 1 TABLET ONCE DAILY.  . Calcium-Magnesium-Zinc (CAL-MAG-ZINC PO) Take 1 tablet by mouth daily.  . Cholecalciferol (VITAMIN D-3) 5000 UNITS TABS Take 5,000 Units by mouth daily.   . Coenzyme Q10 (CO Q 10) 100 MG CAPS Take 1 capsule by mouth every morning.   . hydrochlorothiazide (MICROZIDE) 12.5 MG capsule TAKE (1) CAPSULE DAILY.  Marland Kitchen ibuprofen (ADVIL,MOTRIN) 200 MG tablet Take 400 mg by mouth every 6 (six) hours as needed for pain.  . isosorbide mononitrate (IMDUR) 60 MG 24 hr tablet TAKE 1 TABLET EACH DAY.  . LORazepam (ATIVAN) 1 MG tablet Take 1 mg by mouth at bedtime.   . metoprolol succinate (TOPROL-XL) 25 MG 24 hr tablet Take 1 tablet (25 mg total) by mouth  daily.  . multivitamin-lutein (OCUVITE-LUTEIN) CAPS capsule Take 1 capsule by mouth daily.  . nitroGLYCERIN (NITROSTAT) 0.4 MG SL tablet Place 1 tablet (0.4 mg total) under the tongue every 5 (five) minutes x 3 doses as needed for chest pain.  Marland Kitchen omega-3 acid ethyl esters (LOVAZA) 1 G capsule Take 1 g by mouth daily.  . pantoprazole (PROTONIX) 40 MG tablet Take 1 tablet (40 mg total) by mouth daily.  Marland Kitchen Propylene Glycol (SYSTANE BALANCE OP) Place 1 drop into both eyes daily.  . ramipril (ALTACE) 10 MG capsule Take 10 mg by mouth 2 (two) times daily.  . Ranibizumab (LUCENTIS) 0.5 MG/0.05ML SOLN Inject 0.5 mg into the eye See admin instructions. Pt gets this injection every 9 weeks. Next treatment is due on 01-18-12. Pt is followed by [facility].  Gets 1 shot at a time, one week apart.  . solifenacin (VESICARE) 5 MG tablet Take 5 mg by mouth daily.  Marland Kitchen warfarin (COUMADIN) 5 MG tablet Take 5-7.5 mg by mouth See admin instructions. 7.5 mg on Mondays. 5 mg all other days      Allergies:   No known allergies   Social History   Social History  . Marital status: Married    Spouse name: N/A  . Number of children: N/A  . Years of education: N/A   Social History Main Topics  . Smoking status: Former Smoker    Types: Cigarettes    Quit date: 02/14/1983  . Smokeless tobacco: Never Used  . Alcohol use No     Comment: patient doesn't drink anymore  . Drug use: No  . Sexual activity: Not Asked   Other Topics Concern  . None   Social History Narrative   [employer]; played football; score touchdown against [employer] Hx: The patient's family history includes Heart attack (age of onset: 50) in his mother.  ROS:   Please see the history of present illness.    Review of Systems  Cardiovascular: Positive for leg swelling.  Hematologic/Lymphatic: Bruises/bleeds easily.   All other systems reviewed and are negative.   EKGs/Labs/Other Test Reviewed:    EKG:  EKG is  ordered today.  The ekg ordered today demonstrates coarse AF, HR 51, RBBB, no change from prior tracing   Recent Labs: 06/09/2016: ALT 19 06/14/2016: BUN 13; Creatinine, Ser 0.75; Hemoglobin 13.7; Magnesium 2.0; Platelets 89; Potassium 4.0; Sodium 136   Recent Lipid Panel    Component Value Date/Time   CHOL 126 10/03/2013 1009   TRIG 58.0 10/03/2013 1009   HDL 45.50 10/03/2013 1009   CHOLHDL 3 10/03/2013 1009   VLDL 11.6 10/03/2013 1009   LDLCALC 69 10/03/2013 1009    Physical Exam:    VS:  BP 132/62   Pulse (!) 51   Ht 5\' 8"  (1.727 m)   Wt 184 lb (83.5 kg)   BMI 27.98 kg/m     Wt Readings from Last 3 Encounters:  06/23/16 184 lb (83.5 kg)  06/15/16 188 lb 11.4 oz (85.6 kg)  06/09/16 190 lb 8 oz (86.4 kg)     Physical Exam  Constitutional: He is oriented to person, place, and time. He appears well-developed and well-nourished. No distress.  HENT:  Head: Normocephalic and atraumatic.  Eyes: No scleral icterus.  Neck: Normal range of motion. No JVD present.   Cardiovascular: Normal rate, S1 normal and S2 normal.  An irregularly irregular rhythm present.  No murmur heard. Pulmonary/Chest: Effort normal  and breath sounds normal. He has no wheezes. He has no rhonchi. He has no rales.  Abdominal: Soft. There is no tenderness.  Musculoskeletal: He exhibits no edema or deformity (bilateral FA sites without hematoma or bruits).  Neurological: He is alert and oriented to person, place, and time.  Skin: Skin is warm and dry.  Psychiatric: He has a normal mood and affect.    ASSESSMENT:    1. S/P TAVR (transcatheter aortic valve replacement)   2. Coronary artery disease involving native coronary artery of native heart with angina pectoris (HCC)   3. Permanent atrial fibrillation ([place])   4. Essential hypertension    PLAN:    In order of problems listed above:  1. S/P TAVR (transcatheter aortic valve replacement) -  He is progressing well after recent TAVR. He has no murmur on exam. His postoperative echo demonstrated just trace perivalvular regurgitation. He has follow-up with Dr. Burt Knack later this month with a repeat echocardiogram. Continue aspirin, Coumadin. Continue SBE prophylaxis.  2. Coronary artery disease involving native coronary artery of native heart with angina pectoris - Status post multiple prior PCI's. Cardiac catheterization prior to his valve surgery continue to demonstrate high-grade mid RCA stenosis. This has been treated medically. Continue aspirin, amlodipine, isosorbide, beta blocker, statin.  3. Permanent atrial fibrillation (HCC) -  Rate controlled. He is tolerating Coumadin anticoagulation. Continue current regimen.  4. Essential hypertension - The patient's blood pressure is controlled on his current regimen.  Continue current therapy.     Dispo:  Return in about 12 days (around 07/05/2016) for Scheduled Follow Up, w/ Dr. Burt Knack.   Medication Adjustments/Labs and Tests Ordered: Current medicines are reviewed at length  with the patient today.  Concerns regarding medicines are outlined above.  Orders/Tests:  Orders Placed This Encounter  Procedures  . EKG 12-Lead   Medication changes: No orders of the defined types were placed in this encounter.  Signed, [name], PA-C  06/23/2016 11:56 AM    [facility] [place], [place], [place]  [address] Phone: [phone]; Fax: [phone]

## 2016-06-25 ENCOUNTER — Encounter (HOSPITAL_COMMUNITY): Payer: Self-pay | Admitting: Cardiovascular Disease

## 2016-06-25 NOTE — Addendum Note (Signed)
Addendum  created 06/25/16 2208 by Nolon Nations, MD   Anesthesia Event edited, Anesthesia Staff edited

## 2016-07-05 ENCOUNTER — Ambulatory Visit (HOSPITAL_COMMUNITY): Payer: Medicare Other | Attending: Cardiovascular Disease

## 2016-07-05 ENCOUNTER — Encounter (INDEPENDENT_AMBULATORY_CARE_PROVIDER_SITE_OTHER): Payer: Self-pay

## 2016-07-05 ENCOUNTER — Telehealth (HOSPITAL_COMMUNITY): Payer: Self-pay

## 2016-07-05 ENCOUNTER — Ambulatory Visit (INDEPENDENT_AMBULATORY_CARE_PROVIDER_SITE_OTHER): Payer: Medicare Other | Admitting: *Deleted

## 2016-07-05 ENCOUNTER — Encounter: Payer: Self-pay | Admitting: Cardiovascular Disease

## 2016-07-05 ENCOUNTER — Ambulatory Visit (INDEPENDENT_AMBULATORY_CARE_PROVIDER_SITE_OTHER): Payer: Medicare Other | Admitting: Cardiovascular Disease

## 2016-07-05 ENCOUNTER — Other Ambulatory Visit: Payer: Self-pay

## 2016-07-05 VITALS — BP 150/52 | HR 58 | Ht 68.0 in | Wt 184.8 lb

## 2016-07-05 DIAGNOSIS — I4821 Permanent atrial fibrillation: Secondary | ICD-10-CM

## 2016-07-05 DIAGNOSIS — Z5181 Encounter for therapeutic drug level monitoring: Secondary | ICD-10-CM

## 2016-07-05 DIAGNOSIS — I6523 Occlusion and stenosis of bilateral carotid arteries: Secondary | ICD-10-CM

## 2016-07-05 DIAGNOSIS — I4891 Unspecified atrial fibrillation: Secondary | ICD-10-CM | POA: Diagnosis not present

## 2016-07-05 DIAGNOSIS — Z952 Presence of prosthetic heart valve: Secondary | ICD-10-CM | POA: Diagnosis not present

## 2016-07-05 DIAGNOSIS — I482 Chronic atrial fibrillation: Secondary | ICD-10-CM

## 2016-07-05 DIAGNOSIS — I350 Nonrheumatic aortic (valve) stenosis: Secondary | ICD-10-CM | POA: Insufficient documentation

## 2016-07-05 LAB — POCT INR: INR: 2.8

## 2016-07-05 MED ORDER — AMOXICILLIN 500 MG PO TABS: ORAL_TABLET | ORAL | 2 refills | Status: DC

## 2016-07-05 NOTE — Telephone Encounter (Signed)
I called and spoke to patient, patient declined cardiac rehab at this time. Patient stated he and his wife has macular degeneration and they do not drive. Patient wanted to know if [facility] provided transportation.Patient informed that we do not.   Patient does not have anyone that can bring him 3 days a week. Patient informed that if circumstances change to please let us know so we can schedule for cardiac rehab. Patient verbalized understanding. Referral canceled until further notice.

## 2016-07-05 NOTE — Patient Instructions (Addendum)
Medication Instructions:  Your physician has recommended you make the following change in your medication:  1. STOP Protonix (pantoprazole)  Labwork: No new orders.   Testing/Procedures: No new orders.   Follow-Up: Your physician wants you to follow-up in: 6 MONTHS with Dr Burt Knack. You will receive a reminder letter in the mail two months in advance. If you don't receive a letter, please call our office to schedule the follow-up appointment.   Any Other Special Instructions Will Be Listed Below (If Applicable).  Your physician discussed the importance of taking an antibiotic prior to any dental, gastrointestinal, genitourinary procedures to prevent damage to the heart valves from infection. You were given a prescription for an antibiotic based on current SBE prophylaxis guidelines.   If you need a refill on your cardiac medications before your next appointment, please call your pharmacy.

## 2016-07-05 NOTE — Progress Notes (Signed)
Cardiology Office Note Date:  07/05/2016   ID:  Tyler Williams, DOB 1926/05/14, MRN [id number]  PCP:  Burnard Bunting, MD  Cardiologist:  Sherren Mocha, MD    Chief Complaint  Patient presents with  . aortic valve stenosis     History of Present Illness: Tyler Williams is a 83 y.o. male who presents for 30 day TAVR follow-up. The patient developed severe symptomatic aortic stenosis and he was ultimately treated with TAVR via a right transfemoral approach using a 29 mm Sapien 3 transcatheter heart valve on 06/13/2016.  The patient is here alone today. He is doing well and denies any symptoms of shortness of breath or chest pain. He's had no bleeding problems. He denies orthopnea or PND. No heart palpitations. No lightheadedness or syncope.   Past Medical History:  Diagnosis Date  . AAA (abdominal aortic aneurysm) ([place])    s/p repair  . Aortic stenosis, mild    a. mean gradient 17 mmHg on 04/2012 TTE  . Arthritis    knees  . CAD (coronary artery disease)    a. s/p multiple percutaneous prior coronary artery interventions b. 05/2012: unsuccessful PCI to tight RCA->medically managed  . Cancer [facility])    skin cancer removed, right hand, left leg, chest  . Carotid artery disease ([place])    0-39% bilateral ICA stenoses 11/2011  . Chronic anticoagulation    Coumadin  . GERD (gastroesophageal reflux disease)   . GERD (gastroesophageal reflux disease)   . History of pulmonary embolism    1964 related to dislocated hip on right   . History of skin cancer   . Hypercholesteremia   . Hypertension   . Hypertension   . Incidental cecal mass noted on CT imaging 05/24/2016   **An incidental finding of potential clinical significance has been found. 4.6 x 5.6 x 3.7 cm masslike lesion in the cecum adjacent to the ileocecal valve may represent a large polyp or colonic neoplasm. Correlation with nonemergent colonoscopy is strongly recommended in the near future to better evaluate this finding.**   . Macular degeneration    eye injections  . Microscopic hematuria    Followed by urology  . Permanent atrial fibrillation ([place])   . PVD (peripheral vascular disease) ([place])    Bilateral renal artery atherosclerosis, SMA stenosis with collateralization  . RBBB    Chronic  . 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 SSS (sick sinus syndrome) [facility])     Past Surgical History:  Procedure Laterality Date  . ABDOMINAL AORTIC ANEURYSM REPAIR     1993   . CARDIAC CATHETERIZATION  05/24/2012   pD1 20%, oCFX 20%, mCFX 30%, ostial Int Br 30%, distal AV groove CFX 30%, pRCA 30-40%, mRCA 99%, dRCA 30%, PL branch small with diffuse 40%. Unable to pass wire past RCA lesion->medically managed  . CHOLECYSTECTOMY N/A 12/05/2012   Procedure: LAPAROSCOPIC CHOLECYSTECTOMY ;  Surgeon: Edward Jolly, MD;  Location: [place];  Service: General;  Laterality: N/A;  . [place]  . EYE SURGERY     hx of cataract surgery   . [place]  . HERNIA REPAIR     right inguinal hernia repair   . JOINT REPLACEMENT     left knee replacement, partial right  . LEFT HEART CATHETERIZATION WITH CORONARY ANGIOGRAM N/A 05/24/2012   Procedure: LEFT HEART  CATHETERIZATION WITH CORONARY ANGIOGRAM;  Surgeon: Burnell Blanks, MD;  Location: [facility] CATH LAB;  Service: Cardiovascular;  Laterality: N/A;  . ORTHOPEDIC SURGERY    . OTHER SURGICAL HISTORY     right knee popliteal aneurysm surgery stent placed   . OTHER SURGICAL HISTORY    . PARTIAL KNEE ARTHROPLASTY  01/22/2012   Procedure: UNICOMPARTMENTAL KNEE;  Surgeon: Mauri Pole, MD;  [facility];  Service: Orthopedics;  Laterality: Right;  . PERCUTANEOUS CORONARY INTERVENTION-BALLOON ONLY  05/24/2012   Procedure: PERCUTANEOUS CORONARY INTERVENTION-BALLOON ONLY;  Surgeon: Burnell Blanks, MD;   Location: [facility] CATH LAB;  Service: Cardiovascular;;  . RIGHT/LEFT HEART CATH AND CORONARY ANGIOGRAPHY N/A 05/12/2016   Procedure: Right/Left Heart Cath and Coronary Angiography;  Surgeon: Sherren Mocha, MD;  Location: [place] CV LAB;  Service: Cardiovascular;  Laterality: N/A;  . TEE WITHOUT CARDIOVERSION N/A 06/13/2016   Procedure: TRANSESOPHAGEAL ECHOCARDIOGRAM (TEE);  Surgeon: Sherren Mocha, MD;  Location: [place];  Service: Open Heart Surgery;  Laterality: N/A;  . TONSILLECTOMY    . TRANSCATHETER AORTIC VALVE REPLACEMENT, TRANSFEMORAL N/A 06/13/2016   Procedure: TRANSCATHETER AORTIC VALVE REPLACEMENT, TRANSFEMORAL;  Surgeon: Sherren Mocha, MD;  Location: [place];  Service: Open Heart Surgery;  Laterality: N/A;    Current Outpatient Prescriptions  Medication Sig Dispense Refill  . amLODipine (NORVASC) 10 MG tablet Take 1 tablet (10 mg total) by mouth daily. 30 tablet 6  . aspirin EC 81 MG EC tablet Take 1 tablet (81 mg total) by mouth daily.    Marland Kitchen atorvastatin (LIPITOR) 20 MG tablet Take 20 mg by mouth daily.    . Calcium-Magnesium-Zinc (CAL-MAG-ZINC PO) Take 1 tablet by mouth daily.    . Cholecalciferol (VITAMIN D-3) 5000 UNITS TABS Take 5,000 Units by mouth daily.     . Coenzyme Q10 (CO Q 10) 100 MG CAPS Take 1 capsule by mouth every morning.     . hydrochlorothiazide (MICROZIDE) 12.5 MG capsule Take 12.5 mg by mouth daily.    Marland Kitchen ibuprofen (ADVIL,MOTRIN) 200 MG tablet Take 400 mg by mouth every 6 (six) hours as needed for pain.    . isosorbide mononitrate (IMDUR) 60 MG 24 hr tablet Take 60 mg by mouth daily.    Marland Kitchen LORazepam (ATIVAN) 1 MG tablet Take 1 mg by mouth at bedtime.     . metoprolol succinate (TOPROL-XL) 25 MG 24 hr tablet Take 1 tablet (25 mg total) by mouth daily. 30 tablet 6  . multivitamin-lutein (OCUVITE-LUTEIN) CAPS capsule Take 1 capsule by mouth daily.    . nitroGLYCERIN (NITROSTAT) 0.4 MG SL tablet Place 1 tablet (0.4 mg total) under the tongue every 5 (five) minutes x 3  doses as needed for chest pain. 30 tablet 4  . omega-3 acid ethyl esters (LOVAZA) 1 G capsule Take 1 g by mouth daily.    Marland Kitchen Propylene Glycol (SYSTANE BALANCE OP) Place 1 drop into both eyes daily.    . ramipril (ALTACE) 10 MG capsule Take 10 mg by mouth 2 (two) times daily.    . Ranibizumab (LUCENTIS) 0.5 MG/0.05ML SOLN Inject 0.5 mg into the eye See admin instructions. Pt gets this injection every 9 weeks. Next treatment is due on 01-18-12. Pt is followed by [facility].  Gets 1 shot at a time, one week apart.    . solifenacin (VESICARE) 5 MG tablet Take 5 mg by mouth daily.    Marland Kitchen warfarin (COUMADIN) 5 MG tablet Take 5-7.5 mg by mouth See admin instructions. 7.5  mg on Mondays. 5 mg all other days     No current facility-administered medications for this visit.     Allergies:   No known allergies   Social History:  The patient  reports that he quit smoking about 33 years ago. His smoking use included Cigarettes. He has never used smokeless tobacco. He reports that he does not drink alcohol or use drugs.   Family History:  The patient's  family history includes Heart attack (age of onset: 57) in his mother.    ROS:  Please see the history of present illness.    All other systems are reviewed and negative.    PHYSICAL EXAM: VS:  BP (!) 150/52   Pulse (!) 58   Ht 5\' 8"  (1.727 m)   Wt 184 lb 12.8 oz (83.8 kg)   BMI 28.10 kg/m  , BMI Body mass index is 28.1 kg/m. GEN: Well nourished, well developed, in no acute distress  HEENT: normal  Neck: no JVD, no masses.  Cardiac: irregular with 2/6 SEM at the RUSB no diastolic murmur                Respiratory:  clear to auscultation bilaterally, normal work of breathing GI: soft, nontender, nondistended, + BS MS: no deformity or atrophy  Ext: no pretibial edema, pedal pulses 2+= bilaterally, BL groin sites are clear Skin: warm and dry, no rash Neuro:  Strength and sensation are intact Psych: euthymic mood, full affect  EKG:   EKG is ordered today. The ekg ordered today shows atrial fibrillation 58 bpm, right bundle branch block, no change from previous.  Recent Labs: 06/09/2016: ALT 19 06/14/2016: BUN 13; Creatinine, Ser 0.75; Hemoglobin 13.7; Magnesium 2.0; Platelets 89; Potassium 4.0; Sodium 136   Lipid Panel     Component Value Date/Time   CHOL 126 10/03/2013 1009   TRIG 58.0 10/03/2013 1009   HDL 45.50 10/03/2013 1009   CHOLHDL 3 10/03/2013 1009   VLDL 11.6 10/03/2013 1009   LDLCALC 69 10/03/2013 1009      Wt Readings from Last 3 Encounters:  07/05/16 184 lb 12.8 oz (83.8 kg)  06/23/16 184 lb (83.5 kg)  06/15/16 188 lb 11.4 oz (85.6 kg)     Cardiac Studies Reviewed: 2-D echo today is pending. The images are reviewed personally by me and demonstrate normal LV systolic function and normal function of the transcatheter heart valve with trivial paravalvular regurgitation and a mean transvalvular gradient of 11 mmHg.  ASSESSMENT AND PLAN: 1.  Aortic valve disease status post TAVR. The patient's echo is reviewed as above with normal function of the transcatheter heart valve. He will continue on his current medical program without changes. SBE prophylaxis guidelines are reviewed with the patient.  2. Permanent atrial fibrillation: The patient is anticoagulated with warfarin. He is not having bleeding problems. His heart rate is controlled.  3. Coronary artery disease, native vessel: Patient with known severe stenosis of the right coronary artery managed medically because of severe calcification and previous unsuccessful rotational atherectomy. He is doing well without angina.  Current medicines are reviewed with the patient today.  The patient does not have concerns regarding medicines.  Labs/ tests ordered today include:   Orders Placed This Encounter  Procedures  . EKG 12-Lead    Disposition:   FU 6 months  Signed, Sherren Mocha, MD  07/05/2016 5:33 PM    [facility] [place], [place], [place]  [address] Phone: [phone];  Fax: [phone]  

## 2016-07-06 ENCOUNTER — Telehealth: Payer: Self-pay | Admitting: *Deleted

## 2016-07-06 NOTE — Telephone Encounter (Signed)
Spoke with pt and instructed that had spoken with Loma Sousa at Dr Anna Genre office and the Dr wanted him to hold coumadin 2 days before having dental procedure and hold 1 day after procedure and that we are going to clear him for this procedure (open saliva gland left inner aspect lip ) . He is instructed to call us with time of scheduled procedure and he states understanding

## 2016-07-07 NOTE — Telephone Encounter (Signed)
Pt is recently s/p TAVR on 06/15/16 and thus should not hold coumadin for procedure. Will see if physician comfortable doing procedure on therapeutic coumadin or would recommend postponing procedure until after 3 months anticoagulation post-TAVR. Will route to Dr. Burt Knack to make him aware and for any additional recs.

## 2016-07-07 NOTE — Telephone Encounter (Signed)
Agree it would be best for him to wait a few months before having this dental work done. However, if it is urgent he could hold warfarin for a few days and that should entail much risk at all.

## 2016-07-07 NOTE — Telephone Encounter (Signed)
Dental office closed at this time will try again on Tuesday when reopen.

## 2016-07-10 LAB — ECHOCARDIOGRAM COMPLETE
Height: 68 in
Weight: 3019.42 oz

## 2016-07-11 ENCOUNTER — Telehealth: Payer: Self-pay | Admitting: Cardiovascular Disease

## 2016-07-11 NOTE — Telephone Encounter (Signed)
New message    Pt c/o medication issue:  1. Name of Medication: pantoprazol  2. How are you currently taking this medication (dosage and times per day)? unsure  3. Are you having a reaction (difficulty breathing--STAT)? No  4. What is your medication issue? Pt states that this medication was discontinued per Dr. Burt Knack but the pharmacy called him to pick up a refill. He is confused on whether it is discontinued or if he has to go pick this medication up. Would like some clarification.

## 2016-07-11 NOTE — Telephone Encounter (Signed)
Spoke to Tyler Williams at Dr. Irven [place] office and made aware of recommendations. She will discuss with Dr. Trenton Gammon and call back with information or any additional questions.

## 2016-07-11 NOTE — Telephone Encounter (Signed)
I spoke with the pt and made him aware that we did stop protonix at 07/05/16 office visit. The pt will not pick up Rx at the pharmacy.  The pt also has noticed a quick discomfort that occurs under his left rib cage when he is sitting down.  The discomfort comes and goes and only lasts a few seconds. The pt denies chest pain. The pt has noticed this over the past few days and states it occurred 2-3 years ago. At this time the pt will continue with observation of symptoms. The pt will contact the office if he develops pain or an increase in frequency of symptoms.

## 2016-07-18 ENCOUNTER — Ambulatory Visit (INDEPENDENT_AMBULATORY_CARE_PROVIDER_SITE_OTHER): Payer: Medicare Other | Admitting: Podiatry

## 2016-07-18 ENCOUNTER — Encounter: Payer: Self-pay | Admitting: Podiatry

## 2016-07-18 DIAGNOSIS — M79676 Pain in unspecified toe(s): Secondary | ICD-10-CM | POA: Diagnosis not present

## 2016-07-18 DIAGNOSIS — B351 Tinea unguium: Secondary | ICD-10-CM | POA: Diagnosis not present

## 2016-07-18 NOTE — Progress Notes (Signed)
He presents today with a chief complaint of painful elongated toenails.  Objective: Pulses are palpable. Neurologic sensory was intact and is along thick yellow dystrophic and mycotic.  Assessment: Penicillin segment onychomycosis.  Plan: Debridement of toenails 1 through 5 bilateral.

## 2016-07-26 ENCOUNTER — Telehealth: Payer: Self-pay | Admitting: Cardiovascular Disease

## 2016-07-26 NOTE — Telephone Encounter (Signed)
New message      Pt is having blood seepage at the wound site for heart valve,  Had valve replaced 5 weeks ago

## 2016-07-26 NOTE — Telephone Encounter (Signed)
Call was transferred to triage.  Patient reports TAVR on 06/19/16.  About 1 week ago he started noticing a dried bloody area, dime sized area at biggest on his underwear.  Also this morning from the groin site there sounds like a serosanguineous drainage.  His wife reports it is small amount, about the size of a pencil eraser.   Feels like a small bean sized raised area at the groin site.  He doesn't know if this is new or had been present prior to now.  There is no tenderness, no odor.  I reassured them that there can be a small amount of drainage and that there does not sound like any infection.  Pt takes coumadin.  I suggested he loosely place a piece of gauze over the area to better measure any drainage. He is aware I will inform Dr. Burt Knack and call back with any further recommendations.  He also received a recall letter instructing him to schedule an echo in August.  Pt had last echo 5/23.  I advised that it would not need repeated that soon and that we will be following up at a later date.  He was appreciative for all information provided.

## 2016-07-28 NOTE — Telephone Encounter (Signed)
Please arrange for the patient to come in for a wound check Monday thanks

## 2016-07-28 NOTE — Telephone Encounter (Signed)
I spoke with the pt and he denies any issues at this time with bleeding or drainage from his groin site.  The pt states the small knot has resolved to his knowledge.  I advised the pt that he can continue with observation of his site at this time or he can come into the office Monday for Dr Burt Knack to look at his groin site.  To calm the pt's mind he prefers to have Dr Burt Knack look at the site.  I instructed the pt to come into the office on Monday at 1:30.

## 2016-07-31 NOTE — Telephone Encounter (Signed)
Patient came in today for a groin check. Both groin sites are clear. There are soft bruits bilaterally but no continuous murmurs or loud bruits. Both sides are nontender. There is no active bleeding or oozing from either site. There are no firm hematomas. He will continue his current activity level and medications without change. He is reassured about his exam today.

## 2016-07-31 NOTE — Telephone Encounter (Signed)
Pt came into the office for groin check with Dr Burt Knack.

## 2016-08-03 DIAGNOSIS — H353231 Exudative age-related macular degeneration, bilateral, with active choroidal neovascularization: Secondary | ICD-10-CM | POA: Diagnosis not present

## 2016-08-04 ENCOUNTER — Ambulatory Visit (INDEPENDENT_AMBULATORY_CARE_PROVIDER_SITE_OTHER): Payer: Medicare Other | Admitting: *Deleted

## 2016-08-04 DIAGNOSIS — I4891 Unspecified atrial fibrillation: Secondary | ICD-10-CM

## 2016-08-04 DIAGNOSIS — I482 Chronic atrial fibrillation: Secondary | ICD-10-CM | POA: Diagnosis not present

## 2016-08-04 DIAGNOSIS — I6523 Occlusion and stenosis of bilateral carotid arteries: Secondary | ICD-10-CM

## 2016-08-04 DIAGNOSIS — I4821 Permanent atrial fibrillation: Secondary | ICD-10-CM

## 2016-08-04 DIAGNOSIS — Z5181 Encounter for therapeutic drug level monitoring: Secondary | ICD-10-CM

## 2016-08-04 LAB — POCT INR: INR: 2.9

## 2016-08-15 ENCOUNTER — Telehealth: Payer: Self-pay | Admitting: Pharmacist

## 2016-08-15 NOTE — Telephone Encounter (Signed)
Called Dr Irven [place] office back with recommendation for pt to hold Coumadin for 3 days prior to procedure. Next Coumadin appt is scheduled for 7/20 - their office states they have no availability to do procedure before that date so will keep f/u Coumadin appt as scheduled.

## 2016-08-15 NOTE — Telephone Encounter (Signed)
Received call from [facility] with Dr Anna Genre [phone]) that pt is scheduled for laser removal of a lesion on his lower lip. They are requesting Coumadin clearance. Pt takes Coumadin for afib with CHADS2 score of 2 (HTN, age) and CHADS2VASc of 62 (age x2, CAD, HTN). Pt did have a TAVR 2 months ago on 06/13/16. Will route to Dr Burt Knack to see if he is comfortable with pt interrupting Coumadin therapy for 3 days given recent TAVR.

## 2016-08-15 NOTE — Telephone Encounter (Signed)
I think that should be ok. thanks

## 2016-08-24 DIAGNOSIS — K116 Mucocele of salivary gland: Secondary | ICD-10-CM | POA: Diagnosis not present

## 2016-08-24 DIAGNOSIS — Z85828 Personal history of other malignant neoplasm of skin: Secondary | ICD-10-CM | POA: Diagnosis not present

## 2016-08-25 NOTE — Addendum Note (Signed)
Addendum  created 08/25/16 0957 by Gavin Telford, MD   Sign clinical note    

## 2016-08-30 DIAGNOSIS — I10 Essential (primary) hypertension: Secondary | ICD-10-CM | POA: Diagnosis not present

## 2016-08-30 DIAGNOSIS — Z125 Encounter for screening for malignant neoplasm of prostate: Secondary | ICD-10-CM | POA: Diagnosis not present

## 2016-08-30 DIAGNOSIS — E784 Other hyperlipidemia: Secondary | ICD-10-CM | POA: Diagnosis not present

## 2016-09-01 ENCOUNTER — Ambulatory Visit (INDEPENDENT_AMBULATORY_CARE_PROVIDER_SITE_OTHER): Payer: Medicare Other | Admitting: *Deleted

## 2016-09-01 DIAGNOSIS — I4891 Unspecified atrial fibrillation: Secondary | ICD-10-CM | POA: Diagnosis not present

## 2016-09-01 DIAGNOSIS — I4821 Permanent atrial fibrillation: Secondary | ICD-10-CM

## 2016-09-01 DIAGNOSIS — I482 Chronic atrial fibrillation: Secondary | ICD-10-CM

## 2016-09-01 DIAGNOSIS — Z5181 Encounter for therapeutic drug level monitoring: Secondary | ICD-10-CM

## 2016-09-01 DIAGNOSIS — I6523 Occlusion and stenosis of bilateral carotid arteries: Secondary | ICD-10-CM

## 2016-09-01 LAB — POCT INR: INR: 2.3

## 2016-09-06 DIAGNOSIS — M1990 Unspecified osteoarthritis, unspecified site: Secondary | ICD-10-CM | POA: Diagnosis not present

## 2016-09-06 DIAGNOSIS — F419 Anxiety disorder, unspecified: Secondary | ICD-10-CM | POA: Diagnosis not present

## 2016-09-06 DIAGNOSIS — Z7901 Long term (current) use of anticoagulants: Secondary | ICD-10-CM | POA: Diagnosis not present

## 2016-09-06 DIAGNOSIS — Z952 Presence of prosthetic heart valve: Secondary | ICD-10-CM | POA: Insufficient documentation

## 2016-09-06 DIAGNOSIS — I10 Essential (primary) hypertension: Secondary | ICD-10-CM | POA: Diagnosis not present

## 2016-09-06 DIAGNOSIS — Z0000 Encounter for general adult medical examination without abnormal findings: Secondary | ICD-10-CM | POA: Diagnosis not present

## 2016-09-06 DIAGNOSIS — I739 Peripheral vascular disease, unspecified: Secondary | ICD-10-CM | POA: Diagnosis not present

## 2016-09-06 DIAGNOSIS — I482 Chronic atrial fibrillation: Secondary | ICD-10-CM | POA: Diagnosis not present

## 2016-09-06 DIAGNOSIS — Z6827 Body mass index (BMI) 27.0-27.9, adult: Secondary | ICD-10-CM | POA: Diagnosis not present

## 2016-09-06 DIAGNOSIS — E784 Other hyperlipidemia: Secondary | ICD-10-CM | POA: Diagnosis not present

## 2016-09-06 DIAGNOSIS — I2510 Atherosclerotic heart disease of native coronary artery without angina pectoris: Secondary | ICD-10-CM | POA: Diagnosis not present

## 2016-09-06 DIAGNOSIS — D692 Other nonthrombocytopenic purpura: Secondary | ICD-10-CM | POA: Diagnosis not present

## 2016-09-14 DIAGNOSIS — H353231 Exudative age-related macular degeneration, bilateral, with active choroidal neovascularization: Secondary | ICD-10-CM | POA: Diagnosis not present

## 2016-09-14 DIAGNOSIS — H35363 Drusen (degenerative) of macula, bilateral: Secondary | ICD-10-CM | POA: Diagnosis not present

## 2016-09-18 ENCOUNTER — Other Ambulatory Visit (HOSPITAL_COMMUNITY): Payer: Medicare Other

## 2016-09-29 ENCOUNTER — Other Ambulatory Visit: Payer: Self-pay | Admitting: Cardiovascular Disease

## 2016-10-06 ENCOUNTER — Other Ambulatory Visit: Payer: Self-pay | Admitting: Cardiovascular Disease

## 2016-10-06 ENCOUNTER — Ambulatory Visit (INDEPENDENT_AMBULATORY_CARE_PROVIDER_SITE_OTHER): Payer: Medicare Other | Admitting: *Deleted

## 2016-10-06 DIAGNOSIS — I4821 Permanent atrial fibrillation: Secondary | ICD-10-CM

## 2016-10-06 DIAGNOSIS — Z5181 Encounter for therapeutic drug level monitoring: Secondary | ICD-10-CM

## 2016-10-06 DIAGNOSIS — I482 Chronic atrial fibrillation: Secondary | ICD-10-CM

## 2016-10-06 DIAGNOSIS — I4891 Unspecified atrial fibrillation: Secondary | ICD-10-CM | POA: Diagnosis not present

## 2016-10-06 DIAGNOSIS — I6523 Occlusion and stenosis of bilateral carotid arteries: Secondary | ICD-10-CM

## 2016-10-06 LAB — POCT INR: INR: 2.4

## 2016-10-17 DIAGNOSIS — H353231 Exudative age-related macular degeneration, bilateral, with active choroidal neovascularization: Secondary | ICD-10-CM | POA: Diagnosis not present

## 2016-10-19 ENCOUNTER — Ambulatory Visit: Payer: Medicare Other | Admitting: Podiatry

## 2016-10-24 ENCOUNTER — Ambulatory Visit (INDEPENDENT_AMBULATORY_CARE_PROVIDER_SITE_OTHER): Payer: Medicare Other | Admitting: Podiatry

## 2016-10-24 ENCOUNTER — Encounter: Payer: Self-pay | Admitting: Podiatry

## 2016-10-24 DIAGNOSIS — M79676 Pain in unspecified toe(s): Secondary | ICD-10-CM

## 2016-10-24 DIAGNOSIS — B351 Tinea unguium: Secondary | ICD-10-CM

## 2016-10-24 NOTE — Progress Notes (Signed)
He presents today chief complaint of painful elongated toenails bilateral.  Objective: Pulses are palpable and capillary fill time is immediate degenerative flexors are intact. Toenails are thick yellow dystrophic onychomycotic.  Assessment: Onychomycosis.  Plan: Debrided nails 1 through 5 bilateral.

## 2016-11-17 ENCOUNTER — Ambulatory Visit (INDEPENDENT_AMBULATORY_CARE_PROVIDER_SITE_OTHER): Payer: Medicare Other

## 2016-11-17 DIAGNOSIS — Z5181 Encounter for therapeutic drug level monitoring: Secondary | ICD-10-CM | POA: Diagnosis not present

## 2016-11-17 DIAGNOSIS — I482 Chronic atrial fibrillation: Secondary | ICD-10-CM | POA: Diagnosis not present

## 2016-11-17 DIAGNOSIS — I4821 Permanent atrial fibrillation: Secondary | ICD-10-CM

## 2016-11-17 DIAGNOSIS — I4891 Unspecified atrial fibrillation: Secondary | ICD-10-CM | POA: Diagnosis not present

## 2016-11-17 LAB — POCT INR: INR: 2.1

## 2016-11-22 DIAGNOSIS — N5201 Erectile dysfunction due to arterial insufficiency: Secondary | ICD-10-CM | POA: Diagnosis not present

## 2016-11-22 DIAGNOSIS — N3281 Overactive bladder: Secondary | ICD-10-CM | POA: Diagnosis not present

## 2016-11-27 ENCOUNTER — Other Ambulatory Visit (HOSPITAL_COMMUNITY): Payer: Self-pay | Admitting: Internal Medicine

## 2016-11-27 DIAGNOSIS — Z85828 Personal history of other malignant neoplasm of skin: Secondary | ICD-10-CM | POA: Diagnosis not present

## 2016-11-27 DIAGNOSIS — D0421 Carcinoma in situ of skin of right ear and external auricular canal: Secondary | ICD-10-CM | POA: Diagnosis not present

## 2016-11-27 DIAGNOSIS — D692 Other nonthrombocytopenic purpura: Secondary | ICD-10-CM | POA: Diagnosis not present

## 2016-11-27 DIAGNOSIS — L821 Other seborrheic keratosis: Secondary | ICD-10-CM | POA: Diagnosis not present

## 2016-11-27 DIAGNOSIS — C44319 Basal cell carcinoma of skin of other parts of face: Secondary | ICD-10-CM | POA: Diagnosis not present

## 2016-11-27 DIAGNOSIS — C44519 Basal cell carcinoma of skin of other part of trunk: Secondary | ICD-10-CM | POA: Diagnosis not present

## 2016-11-27 DIAGNOSIS — I6523 Occlusion and stenosis of bilateral carotid arteries: Secondary | ICD-10-CM

## 2016-11-27 DIAGNOSIS — L570 Actinic keratosis: Secondary | ICD-10-CM | POA: Diagnosis not present

## 2016-11-27 DIAGNOSIS — L814 Other melanin hyperpigmentation: Secondary | ICD-10-CM | POA: Diagnosis not present

## 2016-11-27 DIAGNOSIS — Z23 Encounter for immunization: Secondary | ICD-10-CM | POA: Diagnosis not present

## 2016-11-30 DIAGNOSIS — H353231 Exudative age-related macular degeneration, bilateral, with active choroidal neovascularization: Secondary | ICD-10-CM | POA: Diagnosis not present

## 2016-11-30 DIAGNOSIS — H35361 Drusen (degenerative) of macula, right eye: Secondary | ICD-10-CM | POA: Diagnosis not present

## 2016-12-01 ENCOUNTER — Other Ambulatory Visit (HOSPITAL_COMMUNITY): Payer: Self-pay | Admitting: Internal Medicine

## 2016-12-01 DIAGNOSIS — I6523 Occlusion and stenosis of bilateral carotid arteries: Secondary | ICD-10-CM

## 2016-12-04 ENCOUNTER — Ambulatory Visit (HOSPITAL_COMMUNITY)
Admission: RE | Admit: 2016-12-04 | Discharge: 2016-12-04 | Disposition: A | Discharge status: Home / Self Care | Payer: Medicare Other | Source: Ambulatory Visit | Attending: Cardiology | Admitting: Cardiology

## 2016-12-04 ENCOUNTER — Telehealth: Payer: Self-pay | Admitting: Cardiology

## 2016-12-04 DIAGNOSIS — I6523 Occlusion and stenosis of bilateral carotid arteries: Secondary | ICD-10-CM

## 2016-12-04 NOTE — Telephone Encounter (Signed)
Returned page to patient who was concerned that his BP was 182/82 and HR is 83. He states that his HR is usually in the 60's, sometimes as low as 40. He does not have any symptoms when HR is in the 40's, his main concern is that his HR has seemed to increase today. 83 is the highest HR he has seen. He has taken his medications today. I advised him that a HR of 82 is not detrimental and for his elevated BP to take an extra 12.5 mg of HCTZ tonight. He asked why I did not tell him to take more metoprolol, I said that his last HR when he saw Dr. Burt Knack was 91, and metoprolol can lower the HR further. He seemed dissatisfied with this answer, but was agreeable to plan.   Arbutus Leas 7:49 PM

## 2016-12-15 ENCOUNTER — Telehealth: Payer: Self-pay

## 2016-12-15 NOTE — Telephone Encounter (Signed)
Attempted to call patient - number was busy.  Will try again later. 

## 2016-12-15 NOTE — Telephone Encounter (Signed)
Pt called in wanting to talk to Dr. Donivan Scull nurse about Rx, pantoprazole. Pt states he can be reached on home number.

## 2016-12-21 NOTE — Telephone Encounter (Signed)
Patient reports Dr. Burt Knack stopped his Protonix back in May but he has been experiencing some indigestion in the last several weeks.  The indigestion relieved bu Tums, but he would like something longer acting. He denies any other symptoms. He requests medication recommendation from Dr. Burt Knack for treatment.

## 2016-12-22 NOTE — Telephone Encounter (Signed)
Would be fine to start back on protonix 40 mg daily if that was working before. thx

## 2016-12-25 MED ORDER — PANTOPRAZOLE SODIUM 40 MG PO TBEC: 40.0000 mg | DELAYED_RELEASE_TABLET | Freq: Every day | ORAL | 11 refills | Status: DC

## 2016-12-25 NOTE — Telephone Encounter (Signed)
Instructed DPR (patient's wife) to RESTART PROTONIX 40 mg daily. Refills sent in to pharmacy of choice. She was grateful for call and agrees with treatment plan.

## 2016-12-25 NOTE — Addendum Note (Signed)
Addended by: Harland German A on: 12/25/2016 01:10 PM   Modules accepted: Orders

## 2016-12-29 ENCOUNTER — Ambulatory Visit (INDEPENDENT_AMBULATORY_CARE_PROVIDER_SITE_OTHER): Payer: Medicare Other | Admitting: Pharmacist

## 2016-12-29 DIAGNOSIS — I4891 Unspecified atrial fibrillation: Secondary | ICD-10-CM | POA: Diagnosis not present

## 2016-12-29 DIAGNOSIS — I482 Chronic atrial fibrillation: Secondary | ICD-10-CM | POA: Diagnosis not present

## 2016-12-29 DIAGNOSIS — Z5181 Encounter for therapeutic drug level monitoring: Secondary | ICD-10-CM

## 2016-12-29 DIAGNOSIS — I4821 Permanent atrial fibrillation: Secondary | ICD-10-CM

## 2016-12-29 LAB — POCT INR: INR: 2.5

## 2016-12-29 NOTE — Patient Instructions (Signed)
Continue taking 1 tablet every day except 1.5 tablets on Mondays.  Recheck INR in 6 weeks.  Call if placed on any new medications or if scheduled for any procedures.  Coumadin clinic [phone].

## 2017-01-03 DIAGNOSIS — Z9889 Other specified postprocedural states: Secondary | ICD-10-CM | POA: Diagnosis not present

## 2017-01-03 DIAGNOSIS — Z87891 Personal history of nicotine dependence: Secondary | ICD-10-CM | POA: Diagnosis not present

## 2017-01-03 DIAGNOSIS — I2510 Atherosclerotic heart disease of native coronary artery without angina pectoris: Secondary | ICD-10-CM | POA: Diagnosis not present

## 2017-01-03 DIAGNOSIS — H3589 Other specified retinal disorders: Secondary | ICD-10-CM | POA: Diagnosis not present

## 2017-01-03 DIAGNOSIS — I4891 Unspecified atrial fibrillation: Secondary | ICD-10-CM | POA: Diagnosis not present

## 2017-01-03 DIAGNOSIS — Z79899 Other long term (current) drug therapy: Secondary | ICD-10-CM | POA: Diagnosis not present

## 2017-01-03 DIAGNOSIS — I10 Essential (primary) hypertension: Secondary | ICD-10-CM | POA: Diagnosis not present

## 2017-01-03 DIAGNOSIS — Z9841 Cataract extraction status, right eye: Secondary | ICD-10-CM | POA: Diagnosis not present

## 2017-01-03 DIAGNOSIS — Z9842 Cataract extraction status, left eye: Secondary | ICD-10-CM | POA: Diagnosis not present

## 2017-01-03 DIAGNOSIS — Z7901 Long term (current) use of anticoagulants: Secondary | ICD-10-CM | POA: Diagnosis not present

## 2017-01-03 DIAGNOSIS — H35721 Serous detachment of retinal pigment epithelium, right eye: Secondary | ICD-10-CM | POA: Diagnosis not present

## 2017-01-03 DIAGNOSIS — Z7982 Long term (current) use of aspirin: Secondary | ICD-10-CM | POA: Diagnosis not present

## 2017-01-03 DIAGNOSIS — E7849 Other hyperlipidemia: Secondary | ICD-10-CM | POA: Diagnosis not present

## 2017-01-03 DIAGNOSIS — Z961 Presence of intraocular lens: Secondary | ICD-10-CM | POA: Diagnosis not present

## 2017-01-03 DIAGNOSIS — H353231 Exudative age-related macular degeneration, bilateral, with active choroidal neovascularization: Secondary | ICD-10-CM | POA: Diagnosis not present

## 2017-01-03 DIAGNOSIS — Z86718 Personal history of other venous thrombosis and embolism: Secondary | ICD-10-CM | POA: Diagnosis not present

## 2017-01-18 ENCOUNTER — Ambulatory Visit (INDEPENDENT_AMBULATORY_CARE_PROVIDER_SITE_OTHER): Payer: Medicare Other | Admitting: Cardiovascular Disease

## 2017-01-18 ENCOUNTER — Encounter: Payer: Self-pay | Admitting: Cardiovascular Disease

## 2017-01-18 VITALS — BP 130/40 | HR 46 | Ht 68.0 in | Wt 186.1 lb

## 2017-01-18 DIAGNOSIS — I6523 Occlusion and stenosis of bilateral carotid arteries: Secondary | ICD-10-CM

## 2017-01-18 DIAGNOSIS — I480 Paroxysmal atrial fibrillation: Secondary | ICD-10-CM

## 2017-01-18 DIAGNOSIS — I359 Nonrheumatic aortic valve disorder, unspecified: Secondary | ICD-10-CM | POA: Diagnosis not present

## 2017-01-18 DIAGNOSIS — I2510 Atherosclerotic heart disease of native coronary artery without angina pectoris: Secondary | ICD-10-CM

## 2017-01-18 NOTE — Progress Notes (Signed)
Cardiology Office Note Date:  01/18/2017   ID:  Tyler Williams, DOB May 27, 1926, MRN [id number]  PCP:  Burnard Bunting, MD  Cardiologist:  Sherren Mocha, MD    Chief Complaint  Patient presents with  . Shortness of Breath     History of Present Illness: Tyler Williams is a 83 y.o. male who presents for follow-up of aortic valve disease, coronary artery disease, and permanent atrial fibrillation.  The patient underwent TAVR via a right transfemoral approach using a 29 mm transcatheter heart valve Jun 13, 2016.  He is tolerated long-term warfarin without bleeding problems.  The patient is here alone today.  He has been doing fairly well.  His breathing is greatly improved since he has undergone TAVR.  He had a fall last week when he tripped over a garden hose while he was out doing some work.  He did not have lightheadedness or presyncope at the time.  However, he does admit to feeling "fuzzy headed" at times.  No heart palpitations, orthopnea, or PND.  His chronic leg swelling is unchanged.  Past Medical History:  Diagnosis Date  . AAA (abdominal aortic aneurysm) ([place])    s/p repair  . Aortic stenosis, mild    a. mean gradient 17 mmHg on 04/2012 TTE  . Arthritis    knees  . CAD (coronary artery disease)    a. s/p multiple percutaneous prior coronary artery interventions b. 05/2012: unsuccessful PCI to tight RCA->medically managed  . Cancer [facility])    skin cancer removed, right hand, left leg, chest  . Carotid artery disease ([place])    0-39% bilateral ICA stenoses 11/2011  . Chronic anticoagulation    Coumadin  . GERD (gastroesophageal reflux disease)   . GERD (gastroesophageal reflux disease)   . History of pulmonary embolism    1964 related to dislocated hip on right   . History of skin cancer   . Hypercholesteremia   . Hypertension   . Hypertension   . Incidental cecal mass noted on CT imaging 05/24/2016   **An incidental finding of potential clinical significance has been  found. 4.6 x 5.6 x 3.7 cm masslike lesion in the cecum adjacent to the ileocecal valve may represent a large polyp or colonic neoplasm. Correlation with nonemergent colonoscopy is strongly recommended in the near future to better evaluate this finding.**  . Macular degeneration    eye injections  . Microscopic hematuria    Followed by urology  . Permanent atrial fibrillation ([place])   . PVD (peripheral vascular disease) ([place])    Bilateral renal artery atherosclerosis, SMA stenosis with collateralization  . RBBB    Chronic  . 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 SSS (sick sinus syndrome) [facility])     Past Surgical History:  Procedure Laterality Date  . ABDOMINAL AORTIC ANEURYSM REPAIR     1993   . CARDIAC CATHETERIZATION  05/24/2012   pD1 20%, oCFX 20%, mCFX 30%, ostial Int Br 30%, distal AV groove CFX 30%, pRCA 30-40%, mRCA 99%, dRCA 30%, PL branch small with diffuse 40%. Unable to pass wire past RCA lesion->medically managed  . CHOLECYSTECTOMY N/A 12/05/2012   Procedure: LAPAROSCOPIC CHOLECYSTECTOMY ;  Surgeon: Edward Jolly, MD;  Location: [place];  Service: General;  Laterality: N/A;  . [place]  . EYE SURGERY     hx of cataract surgery   .  [place]  . HERNIA REPAIR     right inguinal hernia repair   . JOINT REPLACEMENT     left knee replacement, partial right  . LEFT HEART CATHETERIZATION WITH CORONARY ANGIOGRAM N/A 05/24/2012   Procedure: LEFT HEART CATHETERIZATION WITH CORONARY ANGIOGRAM;  Surgeon: Burnell Blanks, MD;  Location: [facility] CATH LAB;  Service: Cardiovascular;  Laterality: N/A;  . ORTHOPEDIC SURGERY    . OTHER SURGICAL HISTORY     right knee popliteal aneurysm surgery stent placed   . OTHER SURGICAL HISTORY    . PARTIAL KNEE ARTHROPLASTY  01/22/2012   Procedure: UNICOMPARTMENTAL KNEE;   Surgeon: Mauri Pole, MD;  Location: [facility] ORS;  Service: Orthopedics;  Laterality: Right;  . PERCUTANEOUS CORONARY INTERVENTION-BALLOON ONLY  05/24/2012   Procedure: PERCUTANEOUS CORONARY INTERVENTION-BALLOON ONLY;  Surgeon: Burnell Blanks, MD;  Location: [facility] CATH LAB;  Service: Cardiovascular;;  . RIGHT/LEFT HEART CATH AND CORONARY ANGIOGRAPHY N/A 05/12/2016   Procedure: Right/Left Heart Cath and Coronary Angiography;  Surgeon: Sherren Mocha, MD;  Location: [facility] CV LAB;  Service: Cardiovascular;  Laterality: N/A;  . TEE WITHOUT CARDIOVERSION N/A 06/13/2016   Procedure: TRANSESOPHAGEAL ECHOCARDIOGRAM (TEE);  Surgeon: Sherren Mocha, MD;  Location: [facility];  Service: Open Heart Surgery;  Laterality: N/A;  . TONSILLECTOMY    . TRANSCATHETER AORTIC VALVE REPLACEMENT, TRANSFEMORAL N/A 06/13/2016   Procedure: TRANSCATHETER AORTIC VALVE REPLACEMENT, TRANSFEMORAL;  Surgeon: Sherren Mocha, MD;  Location: [facility];  Service: Open Heart Surgery;  Laterality: N/A;    Current Outpatient Medications  Medication Sig Dispense Refill  . amLODipine (NORVASC) 10 MG tablet Take 1 tablet (10 mg total) by mouth daily. 30 tablet 6  . amoxicillin (AMOXIL) 500 MG tablet Take 4 tablets by mouth one hour prior to dental appointment 8 tablet 2  . aspirin EC 81 MG EC tablet Take 1 tablet (81 mg total) by mouth daily.    Marland Kitchen atorvastatin (LIPITOR) 20 MG tablet TAKE 1 TABLET ONCE DAILY. 30 tablet 8  . Calcium-Magnesium-Zinc (CAL-MAG-ZINC PO) Take 1 tablet by mouth daily.    . Cholecalciferol (VITAMIN D-3) 5000 UNITS TABS Take 5,000 Units by mouth daily.     . Coenzyme Q10 (CO Q 10) 100 MG CAPS Take 1 capsule by mouth every morning.     . hydrochlorothiazide (MICROZIDE) 12.5 MG capsule Take 12.5 mg by mouth daily.    . hydrochlorothiazide (MICROZIDE) 12.5 MG capsule TAKE (1) CAPSULE DAILY. 30 capsule 4  . ibuprofen (ADVIL,MOTRIN) 200 MG tablet Take 400 mg by mouth every 6 (six) hours as needed for pain.    .  isosorbide mononitrate (IMDUR) 60 MG 24 hr tablet TAKE 1 TABLET EACH DAY. 30 tablet 4  . LORazepam (ATIVAN) 1 MG tablet Take 1 mg by mouth at bedtime.     . multivitamin-lutein (OCUVITE-LUTEIN) CAPS capsule Take 1 capsule by mouth daily.    . nitroGLYCERIN (NITROSTAT) 0.4 MG SL tablet Place 1 tablet (0.4 mg total) under the tongue every 5 (five) minutes x 3 doses as needed for chest pain. 30 tablet 4  . omega-3 acid ethyl esters (LOVAZA) 1 G capsule Take 1 g by mouth daily.    . pantoprazole (PROTONIX) 40 MG tablet Take 1 tablet (40 mg total) daily by mouth. 30 tablet 11  . Propylene Glycol (SYSTANE BALANCE OP) Place 1 drop into both eyes daily.    . ramipril (ALTACE) 10 MG capsule Take 10 mg by mouth 2 (two) times daily.    Marland Kitchen  Ranibizumab (LUCENTIS) 0.5 MG/0.05ML SOLN Inject 0.5 mg into the eye See admin instructions. Pt gets this injection every 9 weeks. Next treatment is due on 01-18-12. Pt is followed by [facility].  Gets 1 shot at a time, one week apart.    . solifenacin (VESICARE) 5 MG tablet Take 5 mg by mouth daily.    Marland Kitchen warfarin (COUMADIN) 5 MG tablet Take 5-7.5 mg by mouth See admin instructions. 7.5 mg on Mondays. 5 mg all other days     No current facility-administered medications for this visit.     Allergies:   No known allergies   Social History:  The patient  reports that he quit smoking about 33 years ago. His smoking use included cigarettes. he has never used smokeless tobacco. He reports that he does not drink alcohol or use drugs.   Family History:  The patient's family history includes Heart attack (age of onset: 27) in his mother.    ROS:  Please see the history of present illness.  Otherwise, review of systems is positive for leg swelling, back pain, dizziness, easy bruising, constipation, anxiety, balance problems.  All other systems are reviewed and negative.    PHYSICAL EXAM: VS:  BP (!) 130/40   Pulse (!) 46   Ht 5\' 8"  (1.727 m)   Wt 186 lb 1.9 oz  (84.4 kg)   SpO2 96%   BMI 28.30 kg/m  , BMI Body mass index is 28.3 kg/m. GEN: Well nourished, well developed, in no acute distress  HEENT: normal  Neck: no JVD, no masses. bilateral carotid bruits Cardiac: bradycardic and irregular without murmur or gallop                Respiratory:  clear to auscultation bilaterally, normal work of breathing GI: soft, nontender, nondistended, + BS MS: no deformity or atrophy  Ext: 2= right and 1+ left pretibial edema, pedal pulses 2+= bilaterally Skin: warm and dry, no rash Neuro:  Strength and sensation are intact Psych: euthymic mood, full affect  EKG:  EKG is ordered today. The ekg ordered today shows sinus bradycardia 45 bpm, first-degree AV block.  Right bundle branch block.  Recent Labs: 06/09/2016: ALT 19 06/14/2016: BUN 13; Creatinine, Ser 0.75; Hemoglobin 13.7; Magnesium 2.0; Platelets 89; Potassium 4.0; Sodium 136   Lipid Panel     Component Value Date/Time   CHOL 126 10/03/2013 1009   TRIG 58.0 10/03/2013 1009   HDL 45.50 10/03/2013 1009   CHOLHDL 3 10/03/2013 1009   VLDL 11.6 10/03/2013 1009   LDLCALC 69 10/03/2013 1009      Wt Readings from Last 3 Encounters:  01/18/17 186 lb 1.9 oz (84.4 kg)  07/05/16 184 lb 12.8 oz (83.8 kg)  06/23/16 184 lb (83.5 kg)     Cardiac Studies Reviewed: Echo 07-05-2016: Left ventricle:  The cavity size was normal. Wall thickness was increased in a pattern of moderate LVH. Systolic function was normal. The estimated ejection fraction was in the range of 60% to 65%. Wall motion was normal; there were no regional wall motion abnormalities. The study was not technically sufficient to allow evaluation of LV diastolic dysfunction due to atrial fibrillation.   ------------------------------------------------------------------- Aortic valve:  A bioprosthesis was present.  Doppler:   There was no stenosis.   There was no regurgitation.    VTI ratio of LVOT to aortic valve: 0.41. Valve area (VTI):  1.29 cm^2. Indexed valve area (VTI): 0.64 cm^2/m^2. Peak velocity ratio of LVOT to aortic  valve: 0.4. Valve area (Vmax): 1.24 cm^2. Indexed valve area (Vmax): 0.62 cm^2/m^2. Mean velocity ratio of LVOT to aortic valve: 0.4. Valve area (Vmean): 1.26 cm^2. Indexed valve area (Vmean): 0.62 cm^2/m^2.    Mean gradient (S): 11 mm Hg. Peak gradient (S): 21 mm Hg.  ------------------------------------------------------------------- Aorta:  Aortic root: The aortic root was normal in size. Ascending aorta: The ascending aorta was normal in size.  ------------------------------------------------------------------- Mitral valve:   Moderately thickened leaflets . Leaflet separation was normal.  Doppler:  Transvalvular velocity was within the normal range. There was no evidence for stenosis. There was mild regurgitation.    Peak gradient (D): 8 mm Hg.  ------------------------------------------------------------------- Left atrium:  The atrium was severely dilated.  ------------------------------------------------------------------- Right ventricle:  The cavity size was normal. Systolic function was normal.  ------------------------------------------------------------------- Pulmonic valve:    The valve appears to be grossly normal.  ------------------------------------------------------------------- Tricuspid valve:   Structurally normal valve. The valve appears to be grossly normal.   Leaflet separation was normal.  Doppler: Transvalvular velocity was within the normal range. There was mild regurgitation.  ------------------------------------------------------------------- Pulmonary artery:   Systolic pressure was moderately increased.  ------------------------------------------------------------------- Right atrium:  The atrium was severely dilated.  ------------------------------------------------------------------- Pericardium:  There was no pericardial  effusion.  ------------------------------------------------------------------- Systemic veins: Inferior vena cava: The vessel was normal in size. The respirophasic diameter changes were in the normal range (= 50%), consistent with normal central venous pressure. Diameter: 20 mm.   ASSESSMENT AND PLAN: 1.  Atrial fibrillation, paroxysmal: Interesting that the patient has been in atrial fibrillation for years.  Today his EKG demonstrates marked sinus bradycardia.  Its the first sinus rhythm documented in many years.  Because of his bradycardia will discontinue metoprolol succinate.  He does not appear to be having any particular symptoms other than feeling "foggy headed."  He remains on warfarin for anticoagulation.  2.  Coronary artery disease, native vessel, with angina: The patient has stable CAD and can discontinue aspirin.  His angina is controlled on isosorbide and amlodipine.  We will discontinue his beta-blocker because of marked sinus bradycardia.  3.  Aortic valve disease status post TAVR: Patient with marked symptomatic improvement since undergoing TAVR.  We will repeat an echocardiogram when he returns in 6 months.  His last echo is reviewed and demonstrates normal transcatheter heart valve function.  4.  Hypertension: Discontinue metoprolol.  Otherwise continue current antihypertensive medications.  On my check his blood pressure today is 138/50.  5. Hyperlipidemia: Treated with atorvastatin 20 mg daily.  Current medicines are reviewed with the patient today.  The patient does not have concerns regarding medicines.  Labs/ tests ordered today include:   Orders Placed This Encounter  Procedures  . EKG 12-Lead  . ECHOCARDIOGRAM COMPLETE    Disposition:   FU 6 months  Signed, Sherren Mocha, MD  01/18/2017 5:47 PM    [address], [place], [place]  [address] Phone: [phone]; Fax: [phone]

## 2017-01-18 NOTE — Patient Instructions (Signed)
Medication Instructions:  1) STOP ASPIRIN 2) STOP TOPROL  Labwork: None  Testing/Procedures: Your provider has requested that you have an echocardiogram in May 2019. Echocardiography is a painless test that uses sound waves to create images of your heart. It provides your doctor with information about the size and shape of your heart and how well your heart's chambers and valves are working. This procedure takes approximately one hour. There are no restrictions for this procedure.  Follow-Up: Your provider wants you to follow-up in: May 2019 with Dr. Burt Knack. You will receive a reminder letter in the mail two months in advance. If you don't receive a letter, please call our office to schedule the follow-up appointment.    Any Other Special Instructions Will Be Listed Below (If Applicable).     If you need a refill on your cardiac medications before your next appointment, please call your pharmacy.

## 2017-01-23 ENCOUNTER — Ambulatory Visit: Payer: Medicare Other | Admitting: Podiatry

## 2017-01-24 ENCOUNTER — Telehealth: Payer: Self-pay

## 2017-01-24 MED ORDER — METOPROLOL SUCCINATE ER 25 MG PO TB24: 12.5000 mg | ORAL_TABLET | Freq: Every day | ORAL | 11 refills | Status: DC

## 2017-01-24 NOTE — Telephone Encounter (Signed)
Received message from Coumadin nurse that patient called complaining of irregular HR. Called patient to discuss symptoms.  He states since stopping Toprol at last visit (this was stopped due to bradycardia), his HR has been more irregular. It is the worst at night time. The irregular HR wakes him up and then he cannot get back to sleep. He never feels like his heart is racing. He reports it feels more like it's "skipping around."  He denies CP and SOB during episodes.  Sometimes, he takes a lorazepam and the symptoms subside. He takes a lorazepam every night to help with sleep anyway, but when the HR wakes him up he can't go back to bed. He "just wants a good night's sleep." Today, his BP is 148/61. HR 55. He is taking his medications as directed.  Reiterated to him that his Coumadin will help protect him from stroke while he is in a-fib. He understands he will be called with Dr. Antionette Char recommendations.

## 2017-01-24 NOTE — Telephone Encounter (Signed)
Instructed patient to start TOPROL 12.5 mg daily. He will continue to check HR and BP and will call if HR drops and stays in the 40s.  He was grateful for call and agrees with treatment plan.

## 2017-01-24 NOTE — Telephone Encounter (Signed)
Try Toprol 12.5 mg daily (half of previous dose)

## 2017-01-26 ENCOUNTER — Other Ambulatory Visit: Payer: Self-pay | Admitting: Cardiology

## 2017-01-29 ENCOUNTER — Telehealth: Payer: Self-pay | Admitting: Cardiovascular Disease

## 2017-01-29 NOTE — Telephone Encounter (Signed)
Per Dr. Burt Knack, patient is to increase Toprol to 25 mg daily and schedule follow-up.  Left message to call back.

## 2017-01-29 NOTE — Telephone Encounter (Signed)
Patient states for the first time since restarting Toprol, his afib woke him up again last night. After it starts, he cannot lie flat. Lying flat makes the afib "pounding and skipping" worse. He sat up in a chair, took an ASA 81 mg, and the skipping stopped. He never feels like his heart is racing, just skipping. He is currently asymptomatic.  Today, his BP= 148/60 and HR 50 (patient will NOT increase Toprol at this time). Scheduled patient for evaluation with Dr. Burt Knack this Wednesday. He will call if symptoms worsen prior to appointment. He was grateful for call.

## 2017-01-29 NOTE — Telephone Encounter (Signed)
Mr. Allston is calling because he is in Afib and its pretty bad . Please call

## 2017-01-31 ENCOUNTER — Encounter: Payer: Self-pay | Admitting: Cardiovascular Disease

## 2017-01-31 ENCOUNTER — Ambulatory Visit (INDEPENDENT_AMBULATORY_CARE_PROVIDER_SITE_OTHER): Payer: Medicare Other | Admitting: Cardiovascular Disease

## 2017-01-31 VITALS — BP 178/80 | HR 66 | Ht 68.0 in | Wt 184.1 lb

## 2017-01-31 DIAGNOSIS — I6523 Occlusion and stenosis of bilateral carotid arteries: Secondary | ICD-10-CM

## 2017-01-31 DIAGNOSIS — R002 Palpitations: Secondary | ICD-10-CM | POA: Diagnosis not present

## 2017-01-31 DIAGNOSIS — I480 Paroxysmal atrial fibrillation: Secondary | ICD-10-CM

## 2017-01-31 MED ORDER — METOPROLOL SUCCINATE ER 25 MG PO TB24: 25.0000 mg | ORAL_TABLET | Freq: Every day | ORAL | 11 refills | Status: DC

## 2017-01-31 NOTE — Patient Instructions (Signed)
Medication Instructions:  1) INCREASE TOPROL to 25 mg daily  Labwork: TODAY: CBC, BMET  Testing/Procedures: Your physician has recommended that you wear a holter monitor. Holter monitors are medical devices that record the heart's electrical activity. Doctors most often use these monitors to diagnose arrhythmias. Arrhythmias are problems with the speed or rhythm of the heartbeat. The monitor is a small, portable device. You can wear one while you do your normal daily activities. This is usually used to diagnose what is causing palpitations/syncope (passing out).  Follow-Up: Your provider wants you to follow-up in: 6 months with Dr. Burt Knack. You will receive a reminder letter in the mail two months in advance. If you don't receive a letter, please call our office to schedule the follow-up appointment.    Any Other Special Instructions Will Be Listed Below (If Applicable).     If you need a refill on your cardiac medications before your next appointment, please call your pharmacy.

## 2017-01-31 NOTE — Progress Notes (Signed)
Cardiology Office Note Date:  01/31/2017   ID:  Tyler Williams, DOB 22-Feb-1926, MRN [id number]  PCP:  Burnard Bunting, MD  Cardiologist:  Sherren Mocha, MD    Chief Complaint  Patient presents with  . Palpitations     History of Present Illness: Tyler Williams is a 83 y.o. male who presents for evaluation of heart palpitations.  He was just recently seen for routine evaluation January 18, 2017.  At that time he was noted to be in sinus rhythm after many years of what was considered permanent atrial fibrillation.  Because of slow ventricular rate, his beta-blocker was discontinued.  He then called in because he experienced more frequent heart palpitations and dizziness.  We started him back on metoprolol succinate in half of his previous dose, now 12.5 mg daily.  He continued to have symptoms and he is brought back in for further evaluation today.  He has been experiencing more frequent heart palpitations at nighttime.  He experiences these every night and it has awakened him from sleep.  He continues to have a feeling of "fogginess."  He has not had presyncope or frank syncope.  He denies chest pain or shortness of breath.  He has not increased metoprolol succinate back to his previous dose because he was concerned about slow heart rate.   Past Medical History:  Diagnosis Date  . AAA (abdominal aortic aneurysm) ([employer])    s/p repair  . Aortic stenosis, mild    a. mean gradient 17 mmHg on 04/2012 TTE  . Arthritis    knees  . CAD (coronary artery disease)    a. s/p multiple percutaneous prior coronary artery interventions b. 05/2012: unsuccessful PCI to tight RCA->medically managed  . Cancer [facility])    skin cancer removed, right hand, left leg, chest  . Carotid artery disease ([place])    0-39% bilateral ICA stenoses 11/2011  . Chronic anticoagulation    Coumadin  . GERD (gastroesophageal reflux disease)   . GERD (gastroesophageal reflux disease)   . History of pulmonary embolism    1964 related to dislocated hip on right   . History of skin cancer   . Hypercholesteremia   . Hypertension   . Hypertension   . Incidental cecal mass noted on CT imaging 05/24/2016   **An incidental finding of potential clinical significance has been found. 4.6 x 5.6 x 3.7 cm masslike lesion in the cecum adjacent to the ileocecal valve may represent a large polyp or colonic neoplasm. Correlation with nonemergent colonoscopy is strongly recommended in the near future to better evaluate this finding.**  . Macular degeneration    eye injections  . Microscopic hematuria    Followed by urology  . Permanent atrial fibrillation ([place])   . PVD (peripheral vascular disease) ([place])    Bilateral renal artery atherosclerosis, SMA stenosis with collateralization  . RBBB    Chronic  . 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 SSS (sick sinus syndrome) [facility])     Past Surgical History:  Procedure Laterality Date  . ABDOMINAL AORTIC ANEURYSM REPAIR     1993   . CARDIAC CATHETERIZATION  05/24/2012   pD1 20%, oCFX 20%, mCFX 30%, ostial Int Br 30%, distal AV groove CFX 30%, pRCA 30-40%, mRCA 99%, dRCA 30%, PL branch small with diffuse 40%. Unable to pass wire past RCA lesion->medically managed  . CHOLECYSTECTOMY N/A 12/05/2012  Procedure: LAPAROSCOPIC CHOLECYSTECTOMY ;  Surgeon: Edward Jolly, MD;  Location: [place];  Service: General;  Laterality: N/A;  . [place]  . EYE SURGERY     hx of cataract surgery   . [place]  . HERNIA REPAIR     right inguinal hernia repair   . JOINT REPLACEMENT     left knee replacement, partial right  . LEFT HEART CATHETERIZATION WITH CORONARY ANGIOGRAM N/A 05/24/2012   Procedure: LEFT HEART CATHETERIZATION WITH CORONARY ANGIOGRAM;  Surgeon: Burnell Blanks, MD;  Location: [facility] CATH LAB;  Service:  Cardiovascular;  Laterality: N/A;  . ORTHOPEDIC SURGERY    . OTHER SURGICAL HISTORY     right knee popliteal aneurysm surgery stent placed   . OTHER SURGICAL HISTORY    . PARTIAL KNEE ARTHROPLASTY  01/22/2012   Procedure: UNICOMPARTMENTAL KNEE;  Surgeon: Mauri Pole, MD;  Location: [facility] ORS;  Service: Orthopedics;  Laterality: Right;  . PERCUTANEOUS CORONARY INTERVENTION-BALLOON ONLY  05/24/2012   Procedure: PERCUTANEOUS CORONARY INTERVENTION-BALLOON ONLY;  Surgeon: Burnell Blanks, MD;  Location: [facility] CATH LAB;  Service: Cardiovascular;;  . RIGHT/LEFT HEART CATH AND CORONARY ANGIOGRAPHY N/A 05/12/2016   Procedure: Right/Left Heart Cath and Coronary Angiography;  Surgeon: Sherren Mocha, MD;  Location: [place] CV LAB;  Service: Cardiovascular;  Laterality: N/A;  . TEE WITHOUT CARDIOVERSION N/A 06/13/2016   Procedure: TRANSESOPHAGEAL ECHOCARDIOGRAM (TEE);  Surgeon: Sherren Mocha, MD;  Location: [place];  Service: Open Heart Surgery;  Laterality: N/A;  . TONSILLECTOMY    . TRANSCATHETER AORTIC VALVE REPLACEMENT, TRANSFEMORAL N/A 06/13/2016   Procedure: TRANSCATHETER AORTIC VALVE REPLACEMENT, TRANSFEMORAL;  Surgeon: Sherren Mocha, MD;  Location: [place];  Service: Open Heart Surgery;  Laterality: N/A;    Current Outpatient Medications  Medication Sig Dispense Refill  . atorvastatin (LIPITOR) 20 MG tablet TAKE 1 TABLET ONCE DAILY. 30 tablet 8  . Calcium-Magnesium-Zinc (CAL-MAG-ZINC PO) Take 1 tablet by mouth daily.    . Cholecalciferol (VITAMIN D-3) 5000 UNITS TABS Take 5,000 Units by mouth daily.     . Coenzyme Q10 (CO Q 10) 100 MG CAPS Take 1 capsule by mouth every morning.     . hydrochlorothiazide (MICROZIDE) 12.5 MG capsule TAKE (1) CAPSULE DAILY. 30 capsule 4  . isosorbide mononitrate (IMDUR) 60 MG 24 hr tablet TAKE 1 TABLET EACH DAY. 30 tablet 4  . LORazepam (ATIVAN) 1 MG tablet Take 1 mg by mouth at bedtime.     . metoprolol succinate (TOPROL-XL) 25 MG 24 hr tablet Take 1 tablet (25  mg total) by mouth daily. 30 tablet 11  . multivitamin-lutein (OCUVITE-LUTEIN) CAPS capsule Take 1 capsule by mouth daily.    . nitroGLYCERIN (NITROSTAT) 0.4 MG SL tablet Place 1 tablet (0.4 mg total) under the tongue every 5 (five) minutes x 3 doses as needed for chest pain. 30 tablet 4  . omega-3 acid ethyl esters (LOVAZA) 1 G capsule Take 1 g by mouth daily.    . ramipril (ALTACE) 10 MG capsule Take 10 mg by mouth 2 (two) times daily.    . Ranibizumab (LUCENTIS) 0.5 MG/0.05ML SOLN Inject 0.5 mg into the eye See admin instructions. Pt gets this injection every 9 weeks. Next treatment is due on 01-18-12. Pt is followed by [facility].  Gets 1 shot at a time, one week apart.    . solifenacin (VESICARE) 5 MG tablet Take 5 mg by mouth daily.    Marland Kitchen warfarin (  COUMADIN) 5 MG tablet Take 5-7.5 mg by mouth See admin instructions. 7.5 mg on Mondays. 5 mg all other days     No current facility-administered medications for this visit.     Allergies:   No known allergies   Social History:  The patient  reports that he quit smoking about 33 years ago. His smoking use included cigarettes. he has never used smokeless tobacco. He reports that he does not drink alcohol or use drugs.   Family History:  The patient's  family history includes Heart attack (age of onset: 33) in his mother.    ROS:  Please see the history of present illness.  All other systems are reviewed and negative.    PHYSICAL EXAM: VS:  BP (!) 178/80   Pulse 66   Ht 5\' 8"  (1.727 m)   Wt 184 lb 1.9 oz (83.5 kg)   BMI 28.00 kg/m  , BMI Body mass index is 28 kg/m. GEN: Well nourished, well developed, in no acute distress  HEENT: normal  Neck: no JVD, no masses.  Cardiac: RRR with 2/6 SEM at the RUSB   Respiratory:  clear to auscultation bilaterally, normal work of breathing GI: soft, nontender, nondistended, + BS MS: no deformity or atrophy  Ext: trace bilateral pretibial edema 2+= bilaterally Skin: warm and dry, no  rash Neuro:  Strength and sensation are intact Psych: euthymic mood, full affect  EKG:  EKG is ordered today. The ekg ordered today shows NSR 66 bpm, right bundle branch block, first-degree AV block, occasional PVC  Recent Labs: 06/09/2016: ALT 19 06/14/2016: BUN 13; Creatinine, Ser 0.75; Hemoglobin 13.7; Magnesium 2.0; Platelets 89; Potassium 4.0; Sodium 136   Lipid Panel     Component Value Date/Time   CHOL 126 10/03/2013 1009   TRIG 58.0 10/03/2013 1009   HDL 45.50 10/03/2013 1009   CHOLHDL 3 10/03/2013 1009   VLDL 11.6 10/03/2013 1009   LDLCALC 69 10/03/2013 1009      Wt Readings from Last 3 Encounters:  01/31/17 184 lb 1.9 oz (83.5 kg)  01/18/17 186 lb 1.9 oz (84.4 kg)  07/05/16 184 lb 12.8 oz (83.8 kg)    ASSESSMENT AND PLAN: Heart palpitations: Very interesting that this patient has a long-standing atrial fibrillation, previously considered permanent atrial fibrillation.  Over the last month he has been back in sinus rhythm.  It is possible that he is experiencing symptomatic PVCs.  I have recommended a 24-hour Holter monitor.  We will check a CBC and metabolic panel to rule out any electrolyte abnormalities or symptomatic anemia.  He has had an echocardiogram within the year demonstrating normal left ventricular function.  We will see him back for routine follow-up as planned.  I did recommend that he increase metoprolol succinate back to 25 mg daily.  He otherwise will continue on his current medications which are reviewed today.  Other chronic medical problems are unchanged.  Please see his recent office note for summary of those problems.  Current medicines are reviewed with the patient today.  The patient does not have concerns regarding medicines.  Labs/ tests ordered today include:   Orders Placed This Encounter  Procedures  . Basic metabolic panel  . CBC with Differential/Platelet  . HOLTER MONITOR - 24 HOUR  . EKG 12-Lead    Disposition:   FU 6 months as  planned  Signed, Sherren Mocha, MD  01/31/2017 5:33 PM    [place], [place]  [address] Phone: [phone]; Fax: [phone]

## 2017-02-01 DIAGNOSIS — H35361 Drusen (degenerative) of macula, right eye: Secondary | ICD-10-CM | POA: Diagnosis not present

## 2017-02-01 DIAGNOSIS — H353221 Exudative age-related macular degeneration, left eye, with active choroidal neovascularization: Secondary | ICD-10-CM | POA: Diagnosis not present

## 2017-02-01 DIAGNOSIS — H353231 Exudative age-related macular degeneration, bilateral, with active choroidal neovascularization: Secondary | ICD-10-CM | POA: Diagnosis not present

## 2017-02-01 LAB — CBC WITH DIFFERENTIAL/PLATELET
Basophils Absolute: 0.1 10*3/uL (ref 0.0–0.2)
Basos: 1 %
EOS (ABSOLUTE): 0 10*3/uL (ref 0.0–0.4)
Eos: 0 %
Hematocrit: 46.7 % (ref 37.5–51.0)
Hemoglobin: 16.6 g/dL (ref 13.0–17.7)
Immature Grans (Abs): 0 10*3/uL (ref 0.0–0.1)
Immature Granulocytes: 0 %
Lymphocytes Absolute: 1.6 10*3/uL (ref 0.7–3.1)
Lymphs: 22 %
MCH: 33.9 pg — ABNORMAL HIGH (ref 26.6–33.0)
MCHC: 35.5 g/dL (ref 31.5–35.7)
MCV: 96 fL (ref 79–97)
Monocytes Absolute: 0.7 10*3/uL (ref 0.1–0.9)
Monocytes: 10 %
Neutrophils Absolute: 4.9 10*3/uL (ref 1.4–7.0)
Neutrophils: 67 %
Platelets: 137 10*3/uL — ABNORMAL LOW (ref 150–379)
RBC: 4.89 x10E6/uL (ref 4.14–5.80)
RDW: 14.6 % (ref 12.3–15.4)
WBC: 7.2 10*3/uL (ref 3.4–10.8)

## 2017-02-01 LAB — BASIC METABOLIC PANEL
BUN/Creatinine Ratio: 21 (ref 10–24)
BUN: 23 mg/dL (ref 8–27)
CO2: 25 mmol/L (ref 20–29)
Calcium: 9.3 mg/dL (ref 8.6–10.2)
Chloride: 95 mmol/L — ABNORMAL LOW (ref 96–106)
Creatinine, Ser: 1.09 mg/dL (ref 0.76–1.27)
GFR calc Af Amer: 70 mL/min/{1.73_m2} (ref >59)
GFR calc non Af Amer: 60 mL/min/{1.73_m2} (ref >59)
Glucose: 150 mg/dL — ABNORMAL HIGH (ref 65–99)
Potassium: 4.4 mmol/L (ref 3.5–5.2)
Sodium: 136 mmol/L (ref 134–144)

## 2017-02-09 ENCOUNTER — Ambulatory Visit (INDEPENDENT_AMBULATORY_CARE_PROVIDER_SITE_OTHER): Payer: Medicare Other

## 2017-02-09 ENCOUNTER — Ambulatory Visit (INDEPENDENT_AMBULATORY_CARE_PROVIDER_SITE_OTHER): Payer: Medicare Other | Admitting: *Deleted

## 2017-02-09 DIAGNOSIS — I4821 Permanent atrial fibrillation: Secondary | ICD-10-CM

## 2017-02-09 DIAGNOSIS — Z5181 Encounter for therapeutic drug level monitoring: Secondary | ICD-10-CM

## 2017-02-09 DIAGNOSIS — I480 Paroxysmal atrial fibrillation: Secondary | ICD-10-CM

## 2017-02-09 DIAGNOSIS — I4891 Unspecified atrial fibrillation: Secondary | ICD-10-CM

## 2017-02-09 LAB — POCT INR: INR: 2.8

## 2017-02-09 NOTE — Patient Instructions (Signed)
Description   Continue taking 1 tablet every day except 1.5 tablets on Mondays.  Recheck INR in 6 weeks.  Call if placed on any new medications or if scheduled for any procedures.  Coumadin clinic #[phone].      

## 2017-02-20 ENCOUNTER — Ambulatory Visit (INDEPENDENT_AMBULATORY_CARE_PROVIDER_SITE_OTHER): Payer: Medicare Other | Admitting: Podiatry

## 2017-02-20 ENCOUNTER — Telehealth: Payer: Self-pay | Admitting: Cardiovascular Disease

## 2017-02-20 DIAGNOSIS — B351 Tinea unguium: Secondary | ICD-10-CM | POA: Diagnosis not present

## 2017-02-20 DIAGNOSIS — M79676 Pain in unspecified toe(s): Secondary | ICD-10-CM | POA: Diagnosis not present

## 2017-02-20 NOTE — Telephone Encounter (Signed)
Pt c/o medication issue:  1. Name of Medication: metoprolol succinate (TOPROL-XL) 25 MG 24 hr tablet  2. How are you currently taking this medication (dosage and times per day)?  Take 1 tablet (25 mg total) by mouth daily.  3. Are you having a reaction (difficulty breathing--STAT)?  no  4. What is your medication issue? HR  45 39

## 2017-02-20 NOTE — Telephone Encounter (Signed)
Patient called to report HR of mid 40s today. He has no new symptoms, but does say he's "tired as usual." He has no other complaints. Instructed patient to monitor HR prior to taking Toprol and to hold if less than 55 bpm.  He understands he will be called with Dr. Antionette Char recommendations and to call if symptoms occur prior to that time.

## 2017-02-20 NOTE — Progress Notes (Signed)
He presents today chief complaint of painful elongated toenails.  Objective: Pulses remain palpable no open lesions or wounds.  His toenails are long thick yellow dystrophic clinically mycotic painful on palpation as well as debridement.  Assessment: Pain in limb secondary to onychomycosis 1 through 5 bilateral.  Plan: Debridement of toenails 1 through 5 bilateral cover service secondary to pain.

## 2017-02-21 NOTE — Telephone Encounter (Signed)
We've had him on and off beta blockers. He had more symptoms with the beta blocker off. Would continue.

## 2017-02-22 NOTE — Telephone Encounter (Signed)
Instructed patient to continue medication as he feels better taking it. He will continue to monitor HR and call if symptoms occur. He was grateful for follow-up.

## 2017-02-26 ENCOUNTER — Telehealth: Payer: Self-pay | Admitting: Cardiovascular Disease

## 2017-02-26 ENCOUNTER — Other Ambulatory Visit: Payer: Self-pay | Admitting: Cardiovascular Disease

## 2017-02-26 DIAGNOSIS — Z952 Presence of prosthetic heart valve: Secondary | ICD-10-CM

## 2017-02-26 NOTE — Telephone Encounter (Signed)
New message     Patient would like to speak with you about the Rehab program at [facility] - he is wanting to get order to join the rehab program

## 2017-02-26 NOTE — Telephone Encounter (Signed)
Patient requests Dr. Burt Knack give him referral for Cardiac Rehab.  He states he is really bored at home. He wants to exercise for his heart health and is willing to pay the self-pay fee if insurance does not pay for it. He understands he will be called with Dr. Antionette Char decision.

## 2017-02-27 ENCOUNTER — Telehealth: Payer: Self-pay

## 2017-02-27 NOTE — Telephone Encounter (Signed)
Pharmacy is requesting  prescription for amlodipine , This medication is not on the pt's current med list. Called patient and he states that he is still taking the amlodipine . Is patient suppose to be taking.

## 2017-02-28 NOTE — Telephone Encounter (Signed)
Left message to call back  

## 2017-02-28 NOTE — Telephone Encounter (Signed)
Follow up   Patient returning phone call 

## 2017-02-28 NOTE — Telephone Encounter (Signed)
Confirmed the patient is still taking amlodipine 10 mg - it seems like it was erroneously taken out of med list. Tyler Williams reports his BP always stays in the 145-150/70 range. Today his HR was 58 when he checked it. He is requesting Dr. Burt Knack look over his medication list and see if there are any medications that may be taken out of his regimen. He is willing to increase dosages of medications he is already taking.

## 2017-02-28 NOTE — Telephone Encounter (Signed)
This is fine. Please refer for Phase II cardiac rehab. He should benefit from this.

## 2017-03-01 DIAGNOSIS — H353231 Exudative age-related macular degeneration, bilateral, with active choroidal neovascularization: Secondary | ICD-10-CM | POA: Diagnosis not present

## 2017-03-01 DIAGNOSIS — H35361 Drusen (degenerative) of macula, right eye: Secondary | ICD-10-CM | POA: Diagnosis not present

## 2017-03-01 NOTE — Telephone Encounter (Signed)
I reviewed the patient's antihypertensive regimen and I do not see any medicines that can be discontinued at this time.  In addition, his systolic blood pressure is above goal but I think reasonable for someone his age.  I would continue the same program at this point.

## 2017-03-02 ENCOUNTER — Telehealth (HOSPITAL_COMMUNITY): Payer: Self-pay

## 2017-03-02 NOTE — Telephone Encounter (Signed)
Informed patient Cardiac rehab referral is placed. He understands they will call to arrange sessions. He was grateful for assistance.

## 2017-03-02 NOTE — Telephone Encounter (Signed)
Patient understands to continue current medication regimen at this time.

## 2017-03-02 NOTE — Telephone Encounter (Signed)
Patients insurance is active and benefits verified through Medicare Part A & B - No co-pay, deductible amount of $185.00/$44.29 has been met, no out of pocket, 20% co-insurance, and no pre-authorization is required. Passport/reference [phone]  Patients insurance is active and benefits verified through [employer] [place] - No co-pay, no deductible, no out of pocket, no co-insurance, and no pre-authorization is required. Passport/reference [phone]  Patient will be contacted and scheduled.

## 2017-03-03 ENCOUNTER — Other Ambulatory Visit: Payer: Self-pay | Admitting: Cardiovascular Disease

## 2017-03-05 ENCOUNTER — Encounter (HOSPITAL_COMMUNITY): Payer: Self-pay

## 2017-03-05 ENCOUNTER — Telehealth (HOSPITAL_COMMUNITY): Payer: Self-pay

## 2017-03-05 NOTE — Telephone Encounter (Signed)
Attempted to call patient in regards to Cardiac Rehab - Phone kept ringing with no vm. Sending letter.

## 2017-03-09 DIAGNOSIS — F419 Anxiety disorder, unspecified: Secondary | ICD-10-CM | POA: Diagnosis not present

## 2017-03-09 DIAGNOSIS — I10 Essential (primary) hypertension: Secondary | ICD-10-CM | POA: Diagnosis not present

## 2017-03-09 DIAGNOSIS — I2510 Atherosclerotic heart disease of native coronary artery without angina pectoris: Secondary | ICD-10-CM | POA: Diagnosis not present

## 2017-03-09 DIAGNOSIS — E7849 Other hyperlipidemia: Secondary | ICD-10-CM | POA: Diagnosis not present

## 2017-03-09 DIAGNOSIS — Z7901 Long term (current) use of anticoagulants: Secondary | ICD-10-CM | POA: Diagnosis not present

## 2017-03-09 DIAGNOSIS — Z1389 Encounter for screening for other disorder: Secondary | ICD-10-CM | POA: Diagnosis not present

## 2017-03-09 DIAGNOSIS — M1990 Unspecified osteoarthritis, unspecified site: Secondary | ICD-10-CM | POA: Diagnosis not present

## 2017-03-09 DIAGNOSIS — D692 Other nonthrombocytopenic purpura: Secondary | ICD-10-CM | POA: Diagnosis not present

## 2017-03-09 DIAGNOSIS — I482 Chronic atrial fibrillation: Secondary | ICD-10-CM | POA: Diagnosis not present

## 2017-03-09 DIAGNOSIS — Z952 Presence of prosthetic heart valve: Secondary | ICD-10-CM | POA: Diagnosis not present

## 2017-03-09 DIAGNOSIS — Z6828 Body mass index (BMI) 28.0-28.9, adult: Secondary | ICD-10-CM | POA: Diagnosis not present

## 2017-03-09 DIAGNOSIS — I739 Peripheral vascular disease, unspecified: Secondary | ICD-10-CM | POA: Diagnosis not present

## 2017-03-12 ENCOUNTER — Telehealth (HOSPITAL_COMMUNITY): Payer: Self-pay

## 2017-03-12 NOTE — Telephone Encounter (Signed)
2nd attempt to call patient in regards to Cardiac Rehab - lm on vm.

## 2017-03-13 ENCOUNTER — Telehealth (HOSPITAL_COMMUNITY): Payer: Self-pay

## 2017-03-13 NOTE — Telephone Encounter (Signed)
Patient returned phone call in regards to Cardiac Rehab - Patient is interested in the program. Scheduled orientation on 04/24/2017 at 8:45am. Patient will attend the 9:45am exc class.

## 2017-03-23 ENCOUNTER — Ambulatory Visit (INDEPENDENT_AMBULATORY_CARE_PROVIDER_SITE_OTHER): Payer: Medicare Other | Admitting: Pharmacist

## 2017-03-23 DIAGNOSIS — I4821 Permanent atrial fibrillation: Secondary | ICD-10-CM

## 2017-03-23 DIAGNOSIS — I482 Chronic atrial fibrillation: Secondary | ICD-10-CM | POA: Diagnosis not present

## 2017-03-23 DIAGNOSIS — I4891 Unspecified atrial fibrillation: Secondary | ICD-10-CM

## 2017-03-23 DIAGNOSIS — Z5181 Encounter for therapeutic drug level monitoring: Secondary | ICD-10-CM

## 2017-03-23 LAB — POCT INR: INR: 3.1

## 2017-03-23 NOTE — Patient Instructions (Signed)
Description   Take 1/2 tablet today, then continue taking 1 tablet every day except 1.5 tablets on Mondays.  Recheck INR in 5 weeks.  Call if placed on any new medications or if scheduled for any procedures.  Coumadin clinic [phone].

## 2017-03-30 DIAGNOSIS — L821 Other seborrheic keratosis: Secondary | ICD-10-CM | POA: Diagnosis not present

## 2017-03-30 DIAGNOSIS — D044 Carcinoma in situ of skin of scalp and neck: Secondary | ICD-10-CM | POA: Diagnosis not present

## 2017-03-30 DIAGNOSIS — D1801 Hemangioma of skin and subcutaneous tissue: Secondary | ICD-10-CM | POA: Diagnosis not present

## 2017-03-30 DIAGNOSIS — L814 Other melanin hyperpigmentation: Secondary | ICD-10-CM | POA: Diagnosis not present

## 2017-03-30 DIAGNOSIS — Z85828 Personal history of other malignant neoplasm of skin: Secondary | ICD-10-CM | POA: Diagnosis not present

## 2017-03-30 DIAGNOSIS — D225 Melanocytic nevi of trunk: Secondary | ICD-10-CM | POA: Diagnosis not present

## 2017-03-30 DIAGNOSIS — D692 Other nonthrombocytopenic purpura: Secondary | ICD-10-CM | POA: Diagnosis not present

## 2017-03-30 DIAGNOSIS — L570 Actinic keratosis: Secondary | ICD-10-CM | POA: Diagnosis not present

## 2017-04-05 DIAGNOSIS — H353231 Exudative age-related macular degeneration, bilateral, with active choroidal neovascularization: Secondary | ICD-10-CM | POA: Diagnosis not present

## 2017-04-20 ENCOUNTER — Telehealth (HOSPITAL_COMMUNITY): Payer: Self-pay

## 2017-04-20 NOTE — Telephone Encounter (Signed)
Patient would like to cancel cardiac rehab appointment on 3/12. He will call the office on Monday morning to do so.   Tyler Williams, PharmD Pharmacy Resident Pager: [phone]

## 2017-04-23 ENCOUNTER — Telehealth (HOSPITAL_COMMUNITY): Payer: Self-pay

## 2017-04-23 NOTE — Telephone Encounter (Signed)
Received message from pharmacists stating that patient wanted to cancel CR. Called patient to make sure and patient stated he needs to cancel due to no transportation. Closed referral.

## 2017-04-24 ENCOUNTER — Ambulatory Visit (HOSPITAL_COMMUNITY): Payer: Medicare Other

## 2017-04-27 ENCOUNTER — Ambulatory Visit (INDEPENDENT_AMBULATORY_CARE_PROVIDER_SITE_OTHER): Payer: Medicare Other | Admitting: *Deleted

## 2017-04-27 DIAGNOSIS — I4891 Unspecified atrial fibrillation: Secondary | ICD-10-CM

## 2017-04-27 DIAGNOSIS — Z952 Presence of prosthetic heart valve: Secondary | ICD-10-CM

## 2017-04-27 DIAGNOSIS — I4821 Permanent atrial fibrillation: Secondary | ICD-10-CM

## 2017-04-27 DIAGNOSIS — Z5181 Encounter for therapeutic drug level monitoring: Secondary | ICD-10-CM | POA: Diagnosis not present

## 2017-04-27 DIAGNOSIS — I482 Chronic atrial fibrillation: Secondary | ICD-10-CM | POA: Diagnosis not present

## 2017-04-27 LAB — POCT INR: INR: 2.4

## 2017-04-27 NOTE — Patient Instructions (Signed)
Description   Continue taking 1 tablet every day except 1.5 tablets on Mondays.  Recheck INR in 6 weeks.  Call if placed on any new medications or if scheduled for any procedures.  Coumadin clinic #[phone].      

## 2017-04-30 ENCOUNTER — Ambulatory Visit (HOSPITAL_COMMUNITY): Payer: Medicare Other

## 2017-05-02 ENCOUNTER — Ambulatory Visit (HOSPITAL_COMMUNITY): Payer: Medicare Other

## 2017-05-04 ENCOUNTER — Ambulatory Visit (HOSPITAL_COMMUNITY): Payer: Medicare Other

## 2017-05-07 ENCOUNTER — Ambulatory Visit (HOSPITAL_COMMUNITY): Payer: Medicare Other

## 2017-05-08 DIAGNOSIS — H35362 Drusen (degenerative) of macula, left eye: Secondary | ICD-10-CM | POA: Diagnosis not present

## 2017-05-08 DIAGNOSIS — H353231 Exudative age-related macular degeneration, bilateral, with active choroidal neovascularization: Secondary | ICD-10-CM | POA: Diagnosis not present

## 2017-05-09 ENCOUNTER — Ambulatory Visit (HOSPITAL_COMMUNITY): Payer: Medicare Other

## 2017-05-11 ENCOUNTER — Ambulatory Visit (HOSPITAL_COMMUNITY): Payer: Medicare Other

## 2017-05-14 ENCOUNTER — Ambulatory Visit (HOSPITAL_COMMUNITY): Payer: Medicare Other

## 2017-05-16 ENCOUNTER — Ambulatory Visit (HOSPITAL_COMMUNITY): Payer: Medicare Other

## 2017-05-18 ENCOUNTER — Ambulatory Visit (HOSPITAL_COMMUNITY): Payer: Medicare Other

## 2017-05-21 ENCOUNTER — Ambulatory Visit (HOSPITAL_COMMUNITY): Payer: Medicare Other

## 2017-05-22 ENCOUNTER — Encounter: Payer: Self-pay | Admitting: Podiatry

## 2017-05-22 ENCOUNTER — Ambulatory Visit (INDEPENDENT_AMBULATORY_CARE_PROVIDER_SITE_OTHER): Payer: Medicare Other | Admitting: Podiatry

## 2017-05-22 DIAGNOSIS — B351 Tinea unguium: Secondary | ICD-10-CM | POA: Diagnosis not present

## 2017-05-22 DIAGNOSIS — M79676 Pain in unspecified toe(s): Secondary | ICD-10-CM

## 2017-05-22 DIAGNOSIS — D689 Coagulation defect, unspecified: Secondary | ICD-10-CM

## 2017-05-22 NOTE — Progress Notes (Signed)
Presents today chief complaint of painful elongated toenails.  Objective: Toenails are long thick yellow dystrophic-like mycotic painful palpation.  Assessment: Pain in limb secondary to onychomycosis.  Plan: Debridement of toenails 1 through 5 bilateral. 

## 2017-05-23 ENCOUNTER — Ambulatory Visit (HOSPITAL_COMMUNITY): Payer: Medicare Other

## 2017-05-25 ENCOUNTER — Ambulatory Visit (HOSPITAL_COMMUNITY): Payer: Medicare Other

## 2017-05-28 ENCOUNTER — Ambulatory Visit (HOSPITAL_COMMUNITY): Payer: Medicare Other

## 2017-05-30 ENCOUNTER — Ambulatory Visit (HOSPITAL_COMMUNITY): Payer: Medicare Other

## 2017-06-01 ENCOUNTER — Ambulatory Visit (HOSPITAL_COMMUNITY): Payer: Medicare Other

## 2017-06-04 ENCOUNTER — Ambulatory Visit (HOSPITAL_COMMUNITY): Payer: Medicare Other

## 2017-06-06 ENCOUNTER — Ambulatory Visit (HOSPITAL_COMMUNITY): Payer: Medicare Other

## 2017-06-06 ENCOUNTER — Encounter: Payer: Self-pay | Admitting: Cardiovascular Disease

## 2017-06-08 ENCOUNTER — Ambulatory Visit (HOSPITAL_COMMUNITY): Payer: Medicare Other

## 2017-06-08 ENCOUNTER — Ambulatory Visit (INDEPENDENT_AMBULATORY_CARE_PROVIDER_SITE_OTHER): Payer: Medicare Other | Admitting: *Deleted

## 2017-06-08 DIAGNOSIS — I4891 Unspecified atrial fibrillation: Secondary | ICD-10-CM

## 2017-06-08 DIAGNOSIS — I4821 Permanent atrial fibrillation: Secondary | ICD-10-CM

## 2017-06-08 DIAGNOSIS — I482 Chronic atrial fibrillation: Secondary | ICD-10-CM

## 2017-06-08 DIAGNOSIS — Z952 Presence of prosthetic heart valve: Secondary | ICD-10-CM

## 2017-06-08 DIAGNOSIS — Z5181 Encounter for therapeutic drug level monitoring: Secondary | ICD-10-CM | POA: Diagnosis not present

## 2017-06-08 LAB — POCT INR: INR: 2.3

## 2017-06-08 NOTE — Patient Instructions (Signed)
Description   Continue taking 1 tablet every day except 1.5 tablets on Mondays.  Recheck INR in 6 weeks.  Call if placed on any new medications or if scheduled for any procedures.  Coumadin clinic #[phone].      

## 2017-06-11 ENCOUNTER — Ambulatory Visit (HOSPITAL_COMMUNITY): Payer: Medicare Other

## 2017-06-13 ENCOUNTER — Ambulatory Visit (HOSPITAL_COMMUNITY): Payer: Medicare Other

## 2017-06-14 DIAGNOSIS — H353231 Exudative age-related macular degeneration, bilateral, with active choroidal neovascularization: Secondary | ICD-10-CM | POA: Diagnosis not present

## 2017-06-14 DIAGNOSIS — H35363 Drusen (degenerative) of macula, bilateral: Secondary | ICD-10-CM | POA: Diagnosis not present

## 2017-06-15 ENCOUNTER — Ambulatory Visit (HOSPITAL_COMMUNITY): Payer: Medicare Other

## 2017-06-18 ENCOUNTER — Ambulatory Visit (HOSPITAL_COMMUNITY): Payer: Medicare Other

## 2017-06-20 ENCOUNTER — Ambulatory Visit (HOSPITAL_COMMUNITY): Payer: Medicare Other | Attending: Cardiology

## 2017-06-20 ENCOUNTER — Ambulatory Visit (HOSPITAL_COMMUNITY): Payer: Medicare Other

## 2017-06-20 ENCOUNTER — Other Ambulatory Visit: Payer: Self-pay

## 2017-06-20 ENCOUNTER — Ambulatory Visit (INDEPENDENT_AMBULATORY_CARE_PROVIDER_SITE_OTHER): Payer: Medicare Other | Admitting: Cardiovascular Disease

## 2017-06-20 ENCOUNTER — Encounter: Payer: Self-pay | Admitting: Cardiovascular Disease

## 2017-06-20 ENCOUNTER — Telehealth: Payer: Self-pay | Admitting: Cardiovascular Disease

## 2017-06-20 VITALS — BP 160/74 | HR 62 | Ht 68.0 in | Wt 190.0 lb

## 2017-06-20 DIAGNOSIS — Z48812 Encounter for surgical aftercare following surgery on the circulatory system: Secondary | ICD-10-CM | POA: Insufficient documentation

## 2017-06-20 DIAGNOSIS — I081 Rheumatic disorders of both mitral and tricuspid valves: Secondary | ICD-10-CM | POA: Diagnosis not present

## 2017-06-20 DIAGNOSIS — Z952 Presence of prosthetic heart valve: Secondary | ICD-10-CM | POA: Diagnosis not present

## 2017-06-20 DIAGNOSIS — I359 Nonrheumatic aortic valve disorder, unspecified: Secondary | ICD-10-CM | POA: Diagnosis not present

## 2017-06-20 MED ORDER — FUROSEMIDE 40 MG PO TABS: 40.0000 mg | ORAL_TABLET | Freq: Every day | ORAL | 3 refills | Status: DC

## 2017-06-20 NOTE — Telephone Encounter (Signed)
New Message:      Pt calling to go back over some things concerning his medication after today's visit

## 2017-06-20 NOTE — Progress Notes (Signed)
Cardiology Office Note Date:  06/20/2017   ID:  [name], DOB 06/20/1926, MRN [id number]  PCP:  Burnard Bunting, MD  Cardiologist:  Sherren Mocha, MD    Chief Complaint  Patient presents with  . Shortness of Breath   History of Present Illness: Tyler Williams is a 84 y.o. male who presents for follow-up evaluation.  The patient has been followed for aortic valve disease status post TAVR in May 2018, permanent atrial fibrillation, and coronary artery disease.  He presents today for one year TAVR follow-up.  The patient is here with his son today.  He is doing well.  He denies chest pain, chest pressure, shortness of breath, orthopnea, or PND.  He denies heart palpitations.  No bleeding problems on chronic warfarin.  He does complain of leg swelling.  Has some heaviness in his legs as well.  His vision has deteriorated in the is no longer able to drive.   Past Medical History:  Diagnosis Date  . AAA (abdominal aortic aneurysm) ([place])    s/p repair  . Aortic stenosis, mild    a. mean gradient 17 mmHg on 04/2012 TTE  . Arthritis    knees  . CAD (coronary artery disease)    a. s/p multiple percutaneous prior coronary artery interventions b. 05/2012: unsuccessful PCI to tight RCA->medically managed  . Cancer [facility])    skin cancer removed, right hand, left leg, chest  . Carotid artery disease ([place])    0-39% bilateral ICA stenoses 11/2011  . Chronic anticoagulation    Coumadin  . GERD (gastroesophageal reflux disease)   . GERD (gastroesophageal reflux disease)   . History of pulmonary embolism    1964 related to dislocated hip on right   . History of skin cancer   . Hypercholesteremia   . Hypertension   . Hypertension   . Incidental cecal mass noted on CT imaging 05/24/2016   **An incidental finding of potential clinical significance has been found. 4.6 x 5.6 x 3.7 cm masslike lesion in the cecum adjacent to the ileocecal valve may represent a large polyp or colonic neoplasm.  Correlation with nonemergent colonoscopy is strongly recommended in the near future to better evaluate this finding.**  . Macular degeneration    eye injections  . Microscopic hematuria    Followed by urology  . Permanent atrial fibrillation ([place])   . PVD (peripheral vascular disease) ([place])    Bilateral renal artery atherosclerosis, SMA stenosis with collateralization  . RBBB    Chronic  . 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 SSS (sick sinus syndrome) [facility])     Past Surgical History:  Procedure Laterality Date  . ABDOMINAL AORTIC ANEURYSM REPAIR     1993   . CARDIAC CATHETERIZATION  05/24/2012   pD1 20%, oCFX 20%, mCFX 30%, ostial Int Br 30%, distal AV groove CFX 30%, pRCA 30-40%, mRCA 99%, dRCA 30%, PL branch small with diffuse 40%. Unable to pass wire past RCA lesion->medically managed  . CHOLECYSTECTOMY N/A 12/05/2012   Procedure: LAPAROSCOPIC CHOLECYSTECTOMY ;  Surgeon: Edward Jolly, MD;  Location: [place];  Service: General;  Laterality: N/A;  . [place]  . EYE SURGERY     hx of cataract surgery   . [place]  . HERNIA REPAIR     right inguinal hernia repair   .  JOINT REPLACEMENT     left knee replacement, partial right  . LEFT HEART CATHETERIZATION WITH CORONARY ANGIOGRAM N/A 05/24/2012   Procedure: LEFT HEART CATHETERIZATION WITH CORONARY ANGIOGRAM;  Surgeon: Burnell Blanks, MD;  Location: [facility] CATH LAB;  Service: Cardiovascular;  Laterality: N/A;  . ORTHOPEDIC SURGERY    . OTHER SURGICAL HISTORY     right knee popliteal aneurysm surgery stent placed   . OTHER SURGICAL HISTORY    . PARTIAL KNEE ARTHROPLASTY  01/22/2012   Procedure: UNICOMPARTMENTAL KNEE;  Surgeon: Mauri Pole, MD;  Location: [facility] ORS;  Service: Orthopedics;  Laterality: Right;  . PERCUTANEOUS CORONARY INTERVENTION-BALLOON ONLY   05/24/2012   Procedure: PERCUTANEOUS CORONARY INTERVENTION-BALLOON ONLY;  Surgeon: Burnell Blanks, MD;  Location: [facility] CATH LAB;  Service: Cardiovascular;;  . RIGHT/LEFT HEART CATH AND CORONARY ANGIOGRAPHY N/A 05/12/2016   Procedure: Right/Left Heart Cath and Coronary Angiography;  Surgeon: Sherren Mocha, MD;  Location: [place] CV LAB;  Service: Cardiovascular;  Laterality: N/A;  . TEE WITHOUT CARDIOVERSION N/A 06/13/2016   Procedure: TRANSESOPHAGEAL ECHOCARDIOGRAM (TEE);  Surgeon: Sherren Mocha, MD;  Location: [place];  Service: Open Heart Surgery;  Laterality: N/A;  . TONSILLECTOMY    . TRANSCATHETER AORTIC VALVE REPLACEMENT, TRANSFEMORAL N/A 06/13/2016   Procedure: TRANSCATHETER AORTIC VALVE REPLACEMENT, TRANSFEMORAL;  Surgeon: Sherren Mocha, MD;  Location: [place];  Service: Open Heart Surgery;  Laterality: N/A;    Current Outpatient Medications  Medication Sig Dispense Refill  . amLODipine (NORVASC) 10 MG tablet Take 10 mg by mouth daily.    Marland Kitchen atorvastatin (LIPITOR) 20 MG tablet TAKE 1 TABLET ONCE DAILY. 30 tablet 8  . Calcium-Magnesium-Zinc (CAL-MAG-ZINC PO) Take 1 tablet by mouth daily.    . Cholecalciferol (VITAMIN D-3) 5000 UNITS TABS Take 5,000 Units by mouth daily.     . Coenzyme Q10 10 MG capsule Take 1 tablet by mouth daily.    . isosorbide mononitrate (IMDUR) 60 MG 24 hr tablet TAKE 1 TABLET EACH DAY. 30 tablet 11  . LORazepam (ATIVAN) 1 MG tablet Take 1 mg by mouth at bedtime.     . metoprolol succinate (TOPROL-XL) 25 MG 24 hr tablet Take 1 tablet (25 mg total) by mouth daily. 30 tablet 11  . nitroGLYCERIN (NITROSTAT) 0.4 MG SL tablet Place 1 tablet (0.4 mg total) under the tongue every 5 (five) minutes x 3 doses as needed for chest pain. 30 tablet 4  . omega-3 acid ethyl esters (LOVAZA) 1 G capsule Take 1 g by mouth daily.    . ramipril (ALTACE) 10 MG capsule Take 10 mg by mouth 2 (two) times daily.    . Ranibizumab (LUCENTIS) 0.5 MG/0.05ML SOLN Inject 0.5 mg into the eye  See admin instructions. Pt gets this injection every 9 weeks. Next treatment is due on 01-18-12. Pt is followed by [facility].  Gets 1 shot at a time, one week apart.    . solifenacin (VESICARE) 5 MG tablet Take 5 mg by mouth daily.    Marland Kitchen warfarin (COUMADIN) 5 MG tablet Take 5-7.5 mg by mouth See admin instructions. 7.5 mg on Mondays. 5 mg all other days    . furosemide (LASIX) 40 MG tablet Take 1 tablet (40 mg total) by mouth daily. 90 tablet 3   No current facility-administered medications for this visit.     Allergies:   No known allergies   Social History:  The patient  reports that he quit smoking about 34 years ago. His smoking use included  cigarettes. He has never used smokeless tobacco. He reports that he does not drink alcohol or use drugs.   Family History:  The patient's family history includes Heart attack (age of onset: 15) in his mother.    ROS:  Please see the history of present illness. All other systems are reviewed and negative.    PHYSICAL EXAM: VS:  BP (!) 160/74   Pulse 62   Ht 5\' 8"  (1.727 m)   Wt 190 lb (86.2 kg)   SpO2 97%   BMI 28.89 kg/m  , BMI Body mass index is 28.89 kg/m. GEN: Well nourished, well developed, pleasant elderly male in no acute distress  HEENT: normal  Neck: No masses.  JVP is elevated no carotid bruits Cardiac: Irregularly irregular without murmur or gallop                Respiratory:  clear to auscultation bilaterally, normal work of breathing GI: soft, nontender, nondistended, + BS MS: no deformity or atrophy  Ext: 1+ pretibial edema Skin: warm and dry, no rash Neuro:  Strength and sensation are intact Psych: euthymic mood, full affect  EKG:  EKG is not ordered today.  Recent Labs: 01/31/2017: BUN 23; Creatinine, Ser 1.09; Hemoglobin 16.6; Platelets 137; Potassium 4.4; Sodium 136   Lipid Panel     Component Value Date/Time   CHOL 126 10/03/2013 1009   TRIG 58.0 10/03/2013 1009   HDL 45.50 10/03/2013 1009    CHOLHDL 3 10/03/2013 1009   VLDL 11.6 10/03/2013 1009   LDLCALC 69 10/03/2013 1009      Wt Readings from Last 3 Encounters:  06/20/17 190 lb (86.2 kg)  01/31/17 184 lb 1.9 oz (83.5 kg)  01/18/17 186 lb 1.9 oz (84.4 kg)     Cardiac Studies Reviewed: 2D echo is personally reviewed.  The patient's rhythm appears to be atrial fibrillation.  LV function appears normal with an inferior wall motion abnormality but preserved LVEF.  Transcatheter aortic valve function is normal with trivial aortic regurgitation (paravalvular) and normal gradients with a mean of 8 and peak of 15 mmHg.  Formal interpretation is pending.  ASSESSMENT AND PLAN: 1.  Aortic valve disease status post TAVR: The patient is doing very well and I personally reviewed his echo images today with findings outlined above.  He appears to have normal function of his transcatheter aortic valve.  He continues on warfarin for anticoagulation in the setting of permanent atrial fibrillation.  He understands the indication for SBE prophylaxis.  2.  Chronic diastolic heart failure: The patient does have volume excess on exam.  He seems to have minimal symptoms other than those related to leg swelling.  His neck veins are clearly elevated.  I recommended that he start furosemide 40 mg daily and discontinue low-dose hydrochlorothiazide.  We will check a metabolic panel at the time of his next Coumadin clinic visit.  3.  Permanent atrial fibrillation: Heart rate is well controlled.  He is anticoagulated with warfarin with no bleeding problems.  4.  Coronary artery disease, native vessel, without angina: Medicines will be continued.  These are reviewed today.  He is on a low-dose beta-blocker.  He is not on antiplatelet therapy because of long-term warfarin.  Current medicines are reviewed with the patient today.  The patient does not have concerns regarding medicines.  Labs/ tests ordered today include:   Orders Placed This Encounter    Procedures  . Basic metabolic panel    Disposition:   FU 6  months  Signed, Sherren Mocha, MD  06/20/2017 2:13 PM    [facility] [place], [place], [place]  [address] Phone: [phone]; Fax: [phone]

## 2017-06-20 NOTE — Telephone Encounter (Signed)
Patient called to update medication list. He is taking Toviaz 8 mg daily, not Vesicare.  He would like to know if this is OK to take with Lasix. Informed patient he will be called if it should not be taken together, otherwise, he should take as directed.  To pharmD.

## 2017-06-20 NOTE — Patient Instructions (Signed)
Medication Instructions:  1) START LASIX 40 mg daily 2) STOP HYDROCHLOROTHIAZIDE  Labwork: You will have labs drawn when you come for your next Coumadin visit.  Testing/Procedures: None  Follow-Up: Your provider wants you to follow-up in: 6 months with Dr. Burt Knack. You will receive a reminder letter in the mail two months in advance. If you don't receive a letter, please call our office to schedule the follow-up appointment.    Any Other Special Instructions Will Be Listed Below (If Applicable).     If you need a refill on your cardiac medications before your next appointment, please call your pharmacy.

## 2017-06-21 NOTE — Telephone Encounter (Signed)
Follow up    Pt calling to speak with with nurse states it will not take long

## 2017-06-21 NOTE — Telephone Encounter (Signed)
Spoke with patient and gave him Megan's recommendations.  He verbalized understanding.

## 2017-06-21 NOTE — Telephone Encounter (Signed)
No drug interactions between [place] and his other medications, including Lasix.

## 2017-06-22 ENCOUNTER — Ambulatory Visit (HOSPITAL_COMMUNITY): Payer: Medicare Other

## 2017-06-25 ENCOUNTER — Ambulatory Visit (HOSPITAL_COMMUNITY): Payer: Medicare Other

## 2017-06-27 ENCOUNTER — Ambulatory Visit (HOSPITAL_COMMUNITY): Payer: Medicare Other

## 2017-06-28 ENCOUNTER — Other Ambulatory Visit: Payer: Self-pay | Admitting: Cardiovascular Disease

## 2017-06-29 ENCOUNTER — Ambulatory Visit (HOSPITAL_COMMUNITY): Payer: Medicare Other

## 2017-07-02 ENCOUNTER — Ambulatory Visit (HOSPITAL_COMMUNITY): Payer: Medicare Other

## 2017-07-04 ENCOUNTER — Telehealth: Payer: Self-pay | Admitting: Cardiovascular Disease

## 2017-07-04 ENCOUNTER — Ambulatory Visit (HOSPITAL_COMMUNITY): Payer: Medicare Other

## 2017-07-04 NOTE — Telephone Encounter (Signed)
Doxycycline will interact with warfarin - pt will need INR check ~3 days after starting doxycycline. No other major medication interactions. Called pt who states he already finished his course of doxycycline this morning. Advised him he should call clinic when he is prescribed an antibiotic rather than when he has completed the course due to potential interactions with his warfarin. Scheduled INR check for tomorrow.

## 2017-07-04 NOTE — Telephone Encounter (Signed)
NEW MESSAGE   Pt c/o medication issue:  1. Name of Medication: Doxycycline  2. How are you currently taking this medication (dosage and times per day)? n/a  3. Are you having a reaction (difficulty breathing--STAT)? n/a  4. What is your medication issue? Patient has a spider bite and wants to know if Doxycycline is safe to take with heart meds

## 2017-07-05 ENCOUNTER — Encounter (INDEPENDENT_AMBULATORY_CARE_PROVIDER_SITE_OTHER): Payer: Self-pay

## 2017-07-05 ENCOUNTER — Other Ambulatory Visit: Payer: Medicare Other | Admitting: *Deleted

## 2017-07-05 ENCOUNTER — Ambulatory Visit (INDEPENDENT_AMBULATORY_CARE_PROVIDER_SITE_OTHER): Payer: Medicare Other | Admitting: *Deleted

## 2017-07-05 DIAGNOSIS — I4891 Unspecified atrial fibrillation: Secondary | ICD-10-CM | POA: Diagnosis not present

## 2017-07-05 DIAGNOSIS — Z5181 Encounter for therapeutic drug level monitoring: Secondary | ICD-10-CM

## 2017-07-05 DIAGNOSIS — Z6828 Body mass index (BMI) 28.0-28.9, adult: Secondary | ICD-10-CM | POA: Diagnosis not present

## 2017-07-05 DIAGNOSIS — I4821 Permanent atrial fibrillation: Secondary | ICD-10-CM

## 2017-07-05 DIAGNOSIS — Z952 Presence of prosthetic heart valve: Secondary | ICD-10-CM

## 2017-07-05 DIAGNOSIS — M79644 Pain in right finger(s): Secondary | ICD-10-CM | POA: Diagnosis not present

## 2017-07-05 LAB — BASIC METABOLIC PANEL
BUN/Creatinine Ratio: 17 (ref 10–24)
BUN: 17 mg/dL (ref 8–27)
CO2: 22 mmol/L (ref 20–29)
Calcium: 9 mg/dL (ref 8.6–10.2)
Chloride: 104 mmol/L (ref 96–106)
Creatinine, Ser: 0.98 mg/dL (ref 0.76–1.27)
GFR calc Af Amer: 79 mL/min/{1.73_m2} (ref >59)
GFR calc non Af Amer: 68 mL/min/{1.73_m2} (ref >59)
Glucose: 89 mg/dL (ref 65–99)
Potassium: 4.3 mmol/L (ref 3.5–5.2)
Sodium: 140 mmol/L (ref 134–144)

## 2017-07-05 LAB — POCT INR: INR: 3.4 — AB (ref 2.0–3.0)

## 2017-07-05 NOTE — Patient Instructions (Signed)
Description   Skip today's dose, then continue taking 1 tablet every day except 1.5 tablets on Mondays.  Recheck INR in 3 weeks.  Call if placed on any new medications or if scheduled for any procedures.  Coumadin clinic [phone].

## 2017-07-06 ENCOUNTER — Ambulatory Visit (HOSPITAL_COMMUNITY): Payer: Medicare Other

## 2017-07-11 ENCOUNTER — Ambulatory Visit (HOSPITAL_COMMUNITY): Payer: Medicare Other

## 2017-07-13 ENCOUNTER — Ambulatory Visit (HOSPITAL_COMMUNITY): Payer: Medicare Other

## 2017-07-16 ENCOUNTER — Ambulatory Visit (HOSPITAL_COMMUNITY): Payer: Medicare Other

## 2017-07-18 ENCOUNTER — Encounter: Payer: Self-pay | Admitting: Thoracic Surgery (Cardiothoracic Vascular Surgery)

## 2017-07-18 ENCOUNTER — Ambulatory Visit (HOSPITAL_COMMUNITY): Payer: Medicare Other

## 2017-07-18 DIAGNOSIS — Z85828 Personal history of other malignant neoplasm of skin: Secondary | ICD-10-CM | POA: Diagnosis not present

## 2017-07-18 DIAGNOSIS — C44329 Squamous cell carcinoma of skin of other parts of face: Secondary | ICD-10-CM | POA: Diagnosis not present

## 2017-07-19 DIAGNOSIS — H35363 Drusen (degenerative) of macula, bilateral: Secondary | ICD-10-CM | POA: Diagnosis not present

## 2017-07-19 DIAGNOSIS — H353231 Exudative age-related macular degeneration, bilateral, with active choroidal neovascularization: Secondary | ICD-10-CM | POA: Diagnosis not present

## 2017-07-20 ENCOUNTER — Other Ambulatory Visit: Payer: Medicare Other

## 2017-07-20 ENCOUNTER — Ambulatory Visit (INDEPENDENT_AMBULATORY_CARE_PROVIDER_SITE_OTHER): Payer: Medicare Other | Admitting: *Deleted

## 2017-07-20 ENCOUNTER — Ambulatory Visit (HOSPITAL_COMMUNITY): Payer: Medicare Other

## 2017-07-20 DIAGNOSIS — I482 Chronic atrial fibrillation: Secondary | ICD-10-CM | POA: Diagnosis not present

## 2017-07-20 DIAGNOSIS — Z952 Presence of prosthetic heart valve: Secondary | ICD-10-CM | POA: Diagnosis not present

## 2017-07-20 DIAGNOSIS — I4821 Permanent atrial fibrillation: Secondary | ICD-10-CM

## 2017-07-20 DIAGNOSIS — Z5181 Encounter for therapeutic drug level monitoring: Secondary | ICD-10-CM | POA: Diagnosis not present

## 2017-07-20 DIAGNOSIS — I4891 Unspecified atrial fibrillation: Secondary | ICD-10-CM

## 2017-07-20 LAB — POCT INR: INR: 2.3 (ref 2.0–3.0)

## 2017-07-20 NOTE — Patient Instructions (Signed)
Description   Continue taking 1 tablet every day except 1.5 tablets on Mondays.  Recheck INR in 4 weeks.  Call if placed on any new medications or if scheduled for any procedures.  Coumadin clinic #[phone].       

## 2017-07-23 ENCOUNTER — Ambulatory Visit (HOSPITAL_COMMUNITY): Payer: Medicare Other

## 2017-07-23 DIAGNOSIS — Z85828 Personal history of other malignant neoplasm of skin: Secondary | ICD-10-CM | POA: Diagnosis not present

## 2017-07-23 DIAGNOSIS — L820 Inflamed seborrheic keratosis: Secondary | ICD-10-CM | POA: Diagnosis not present

## 2017-07-25 ENCOUNTER — Ambulatory Visit (HOSPITAL_COMMUNITY): Payer: Medicare Other

## 2017-07-27 ENCOUNTER — Ambulatory Visit (HOSPITAL_COMMUNITY): Payer: Medicare Other

## 2017-07-30 ENCOUNTER — Ambulatory Visit (HOSPITAL_COMMUNITY): Payer: Medicare Other

## 2017-08-01 ENCOUNTER — Ambulatory Visit (HOSPITAL_COMMUNITY): Payer: Medicare Other

## 2017-08-17 ENCOUNTER — Ambulatory Visit (INDEPENDENT_AMBULATORY_CARE_PROVIDER_SITE_OTHER): Payer: Medicare Other | Admitting: *Deleted

## 2017-08-17 ENCOUNTER — Encounter (INDEPENDENT_AMBULATORY_CARE_PROVIDER_SITE_OTHER): Payer: Self-pay

## 2017-08-17 DIAGNOSIS — I4891 Unspecified atrial fibrillation: Secondary | ICD-10-CM

## 2017-08-17 DIAGNOSIS — Z5181 Encounter for therapeutic drug level monitoring: Secondary | ICD-10-CM | POA: Diagnosis not present

## 2017-08-17 DIAGNOSIS — I4821 Permanent atrial fibrillation: Secondary | ICD-10-CM

## 2017-08-17 LAB — POCT INR: INR: 2.5 (ref 2.0–3.0)

## 2017-08-17 NOTE — Patient Instructions (Signed)
Description    Continue taking 1 tablet every day except 1.5 tablets on Mondays.  Recheck INR in 5 weeks.  Call if placed on any new medications or if scheduled for any procedures.  Coumadin clinic #[phone].      

## 2017-08-27 ENCOUNTER — Inpatient Hospital Stay (HOSPITAL_COMMUNITY)
Admission: EM | Admit: 2017-08-27 | Discharge: 2017-09-01 | DRG: 251 | Disposition: A | Discharge status: Home / Self Care | Payer: Medicare Other | Attending: Family Medicine | Admitting: Family Medicine

## 2017-08-27 ENCOUNTER — Encounter (HOSPITAL_COMMUNITY): Payer: Self-pay | Admitting: Emergency Medicine

## 2017-08-27 ENCOUNTER — Other Ambulatory Visit: Payer: Self-pay

## 2017-08-27 ENCOUNTER — Emergency Department (HOSPITAL_COMMUNITY): Payer: Medicare Other

## 2017-08-27 DIAGNOSIS — J9811 Atelectasis: Secondary | ICD-10-CM | POA: Diagnosis present

## 2017-08-27 DIAGNOSIS — I482 Chronic atrial fibrillation: Secondary | ICD-10-CM | POA: Diagnosis present

## 2017-08-27 DIAGNOSIS — R600 Localized edema: Secondary | ICD-10-CM | POA: Diagnosis not present

## 2017-08-27 DIAGNOSIS — I200 Unstable angina: Secondary | ICD-10-CM

## 2017-08-27 DIAGNOSIS — Z7982 Long term (current) use of aspirin: Secondary | ICD-10-CM

## 2017-08-27 DIAGNOSIS — I10 Essential (primary) hypertension: Secondary | ICD-10-CM | POA: Diagnosis present

## 2017-08-27 DIAGNOSIS — Z96652 Presence of left artificial knee joint: Secondary | ICD-10-CM | POA: Diagnosis present

## 2017-08-27 DIAGNOSIS — I110 Hypertensive heart disease with heart failure: Secondary | ICD-10-CM | POA: Diagnosis present

## 2017-08-27 DIAGNOSIS — F419 Anxiety disorder, unspecified: Secondary | ICD-10-CM | POA: Diagnosis present

## 2017-08-27 DIAGNOSIS — Z952 Presence of prosthetic heart valve: Secondary | ICD-10-CM

## 2017-08-27 DIAGNOSIS — E785 Hyperlipidemia, unspecified: Secondary | ICD-10-CM | POA: Diagnosis present

## 2017-08-27 DIAGNOSIS — R072 Precordial pain: Secondary | ICD-10-CM | POA: Diagnosis not present

## 2017-08-27 DIAGNOSIS — K5900 Constipation, unspecified: Secondary | ICD-10-CM | POA: Diagnosis present

## 2017-08-27 DIAGNOSIS — I25110 Atherosclerotic heart disease of native coronary artery with unstable angina pectoris: Secondary | ICD-10-CM | POA: Diagnosis not present

## 2017-08-27 DIAGNOSIS — R079 Chest pain, unspecified: Secondary | ICD-10-CM | POA: Diagnosis not present

## 2017-08-27 DIAGNOSIS — I4892 Unspecified atrial flutter: Secondary | ICD-10-CM | POA: Diagnosis present

## 2017-08-27 DIAGNOSIS — I4510 Unspecified right bundle-branch block: Secondary | ICD-10-CM | POA: Diagnosis present

## 2017-08-27 DIAGNOSIS — Z79899 Other long term (current) drug therapy: Secondary | ICD-10-CM

## 2017-08-27 DIAGNOSIS — I2584 Coronary atherosclerosis due to calcified coronary lesion: Secondary | ICD-10-CM | POA: Diagnosis present

## 2017-08-27 DIAGNOSIS — I350 Nonrheumatic aortic (valve) stenosis: Secondary | ICD-10-CM | POA: Diagnosis present

## 2017-08-27 DIAGNOSIS — I5032 Chronic diastolic (congestive) heart failure: Secondary | ICD-10-CM | POA: Diagnosis present

## 2017-08-27 DIAGNOSIS — Z9861 Coronary angioplasty status: Secondary | ICD-10-CM

## 2017-08-27 DIAGNOSIS — Z7901 Long term (current) use of anticoagulants: Secondary | ICD-10-CM

## 2017-08-27 HISTORY — DX: Atherosclerotic heart disease of native coronary artery without angina pectoris: I25.10

## 2017-08-27 HISTORY — DX: Unspecified atrial fibrillation: I48.91

## 2017-08-27 LAB — CBC
HCT: 44.4 % (ref 39.0–52.0)
Hemoglobin: 15 g/dL (ref 13.0–17.0)
MCH: 33.1 pg (ref 26.0–34.0)
MCHC: 33.8 g/dL (ref 30.0–36.0)
MCV: 98 fL (ref 78.0–100.0)
Platelets: 114 10*3/uL — ABNORMAL LOW (ref 150–400)
RBC: 4.53 MIL/uL (ref 4.22–5.81)
RDW: 13.3 % (ref 11.5–15.5)
WBC: 6.1 10*3/uL (ref 4.0–10.5)

## 2017-08-27 LAB — BASIC METABOLIC PANEL
Anion gap: 8 (ref 5–15)
BUN: 24 mg/dL — ABNORMAL HIGH (ref 8–23)
CO2: 24 mmol/L (ref 22–32)
Calcium: 9 mg/dL (ref 8.9–10.3)
Chloride: 108 mmol/L (ref 98–111)
Creatinine, Ser: 1.01 mg/dL (ref 0.61–1.24)
GFR calc Af Amer: >60 mL/min (ref >60)
GFR calc non Af Amer: >60 mL/min (ref >60)
Glucose, Bld: 156 mg/dL — ABNORMAL HIGH (ref 70–99)
Potassium: 4 mmol/L (ref 3.5–5.1)
Sodium: 140 mmol/L (ref 135–145)

## 2017-08-27 LAB — I-STAT TROPONIN, ED: Troponin i, poc: 0.04 ng/mL (ref 0.00–0.08)

## 2017-08-27 NOTE — ED Provider Notes (Signed)
[facility] EMERGENCY DEPARTMENT Provider Note   CSN: [id number] Arrival date & time: 08/27/17  2243     History   Chief Complaint Chief Complaint  Patient presents with  . Chest Pain    HPI Tyler Williams is a 84 y.o. male.  HPI  This is an 84 year old male with a history of atrial fibrillation, coronary artery disease, hypertension who presents with chest pain.  Patient reports that he had onset of chest pain after eating dinner this evening.  He was sitting down to watch TV.  He had onset of pressure-like chest pain that was nonradiating.  Patient states that the pain lasted for 1 to 2 hours.  Patient took aspirin and nitroglycerin with no improvement of his pain.  However, since being transported by EMS, pain has since resolved.  He denies any shortness of breath, fevers, cough.    Past Medical History:  Diagnosis Date  . A-fib ([place])   . Coronary artery disease   . Hypertension     There are no active problems to display for this patient.   History reviewed. No pertinent surgical history.      Home Medications    Prior to Admission medications   Medication Sig Start Date End Date Taking? Authorizing Provider  amLODipine (NORVASC) 10 MG tablet Take 10 mg by mouth daily.   Yes [provider]  aspirin 81 MG chewable tablet Chew 162 mg by mouth once.   Yes [provider]  atorvastatin (LIPITOR) 20 MG tablet Take 20 mg by mouth every evening.   Yes [provider]  Cholecalciferol (VITAMIN D3) 5000 units TABS Take 1 tablet by mouth daily.   Yes [provider]  Coenzyme Q10 (COQ-10) 10 MG CAPS Take 10 mg by mouth daily.   Yes [provider]  fesoterodine (TOVIAZ) 8 MG TB24 tablet Take 8 mg by mouth every evening.   Yes [provider]  furosemide (LASIX) 40 MG tablet Take 40 mg by mouth daily.   Yes [provider]  isosorbide mononitrate (IMDUR) 60 MG 24 hr tablet Take 60 mg by mouth  daily.   Yes [provider]  LORazepam (ATIVAN) 1 MG tablet Take 1 mg by mouth at bedtime.   Yes [provider]  metoprolol succinate (TOPROL-XL) 25 MG 24 hr tablet Take 25 mg by mouth daily.   Yes [provider]  multivitamin-lutein (OCUVITE-LUTEIN) CAPS capsule Take 1 capsule by mouth daily.   Yes [provider]  nitroGLYCERIN (NITROSTAT) 0.4 MG SL tablet Place 0.4 mg under the tongue every 5 (five) minutes as needed for chest pain.   Yes [provider]  Omega-3 Fatty Acids (FISH OIL PO) Take 1 tablet by mouth daily.   Yes [provider]  ramipril (ALTACE) 10 MG capsule Take 10 mg by mouth 2 (two) times daily.   Yes [provider]  ranibizumab (LUCENTIS) 0.5 MG/0.05ML SOLN 0.5 mg by Intravitreal route See admin instructions. Every 9 weeks   Yes [provider]  warfarin (COUMADIN) 5 MG tablet Take 5-7.5 mg by mouth See admin instructions. Take 1 and 1/2 tablets on Monday then take 1 tablet all the other days   Yes [provider]    Family History No family history on file.  Social History Social History   Tobacco Use  . Smoking status: Never Smoker  . Smokeless tobacco: Never Used  Substance Use Topics  . Alcohol use: Not Currently  Frequency: Never  . Drug use: Never     Allergies   Patient has no known allergies.   Review of Systems Review of Systems  Constitutional: Negative for fever.  Respiratory: Negative for shortness of breath.   Cardiovascular: Positive for chest pain. Negative for leg swelling.  Gastrointestinal: Negative for abdominal pain, nausea and vomiting.  Neurological: Negative for headaches.  All other systems reviewed and are negative.    Physical Exam Updated Vital Signs BP (!) 151/57   Pulse (!) 58   Temp 98 F (36.7 C) (Oral)   Resp 18   SpO2 97%   Physical Exam  Constitutional: He is oriented to person, place, and time.  Elderly but  nontoxic-appearing, no acute distress  HENT:  Head: Normocephalic and atraumatic.  Eyes: Pupils are equal, round, and reactive to light.  Cardiovascular: Normal rate, normal heart sounds and normal pulses. A regularly irregular rhythm present.  No murmur heard. Pulmonary/Chest: Effort normal and breath sounds normal. No respiratory distress. He has no wheezes.  Abdominal: Soft. Bowel sounds are normal. There is no tenderness. There is no rebound.  Musculoskeletal:       Right lower leg: He exhibits edema.       Left lower leg: He exhibits edema.  Trace bilateral lower extremity edema  Lymphadenopathy:    He has no cervical adenopathy.  Neurological: He is alert and oriented to person, place, and time.  Skin: Skin is warm and dry.  Psychiatric: He has a normal mood and affect.  Nursing note and vitals reviewed.    ED Treatments / Results  Labs (all labs ordered are listed, but only abnormal results are displayed) Labs Reviewed  BASIC METABOLIC PANEL - Abnormal; Notable for the following components:      Result Value   Glucose, Bld 156 (*)    BUN 24 (*)    All other components within normal limits  CBC - Abnormal; Notable for the following components:   Platelets 114 (*)    All other components within normal limits  PROTIME-INR  I-STAT TROPONIN, ED    EKG EKG Interpretation  Date/Time:  Monday August 27 2017 22:52:09 EDT Ventricular Rate:  57 PR Interval:    QRS Duration: 159 QT Interval:  467 QTC Calculation: 455 R Axis:   70 Text Interpretation:  Atrial flutter with predominant 4:1 AV block IVCD, consider atypical RBBB Probable lateral infarct, age indeterminate No prior for comparison Confirmed by Thayer Jew [phone] on 08/27/2017 11:02:13 PM   Radiology Dg Chest 2 View  Result Date: 08/27/2017 CLINICAL DATA:  Chest pain starting at 8 this evening. EXAM: CHEST - 2 VIEW COMPARISON:  None. FINDINGS: Cardiomegaly with moderate aortic atherosclerosis at the arch.  Evidence of prior TAVR. No acute pulmonary consolidation or CHF. Bibasilar atelectasis is noted. Mild degenerative change along thoracic spine. IMPRESSION: Cardiomegaly with aortic atherosclerosis. TAVR. Bibasilar atelectasis. Electronically Signed   By: Ashley Royalty M.D.   On: 08/27/2017 23:18    Procedures Procedures (including critical care time)  Medications Ordered in ED Medications - No data to display   Initial Impression / Assessment and Plan / ED Course  I have reviewed the triage vital signs and the nursing notes.  Pertinent labs & imaging results that were available during my care of the patient were reviewed by me and considered in my medical decision making (see chart for details).     Presents with an episode of chest pain.  He is overall nontoxic-appearing on exam.  He is in atrial flutter.  Has a history of the same and is on Coumadin.  He does have an extensive history of heart disease including a TVR, coronary artery disease, diastolic heart failure.  Followed primarily by Dr. Burt Knack.  He is currently chest pain-free.  His EKG shows atrial flutter without ischemic changes.  Initial troponin is 0.04.  Chest x-ray shows no evidence of pneumothorax or pneumonia.  PT/INR is pending.  Given his risk factors, patient will need admission for serial enzymes and possible cardiology evaluation.  He has remained asymptomatic while in the ED.  He has also taken a full dose aspirin.  Final Clinical Impressions(s) / ED Diagnoses   Final diagnoses:  Precordial chest pain    ED Discharge Orders    None       Idona Stach, Barbette Hair, MD 08/28/17 [phone]

## 2017-08-27 NOTE — ED Triage Notes (Signed)
Pt BIB [facility] with c/o sudden onset chest pain that started while watching TV. Denies associated symptoms, pt took 1 NTG and 2 adult aspirin at home, no chest pain at this time.

## 2017-08-28 ENCOUNTER — Telehealth: Payer: Self-pay | Admitting: Cardiovascular Disease

## 2017-08-28 ENCOUNTER — Observation Stay (HOSPITAL_BASED_OUTPATIENT_CLINIC_OR_DEPARTMENT_OTHER): Payer: Medicare Other

## 2017-08-28 ENCOUNTER — Ambulatory Visit: Payer: Medicare Other | Admitting: Podiatry

## 2017-08-28 ENCOUNTER — Encounter (HOSPITAL_COMMUNITY): Payer: Self-pay | Admitting: General Practice

## 2017-08-28 DIAGNOSIS — I340 Nonrheumatic mitral (valve) insufficiency: Secondary | ICD-10-CM | POA: Diagnosis not present

## 2017-08-28 DIAGNOSIS — R079 Chest pain, unspecified: Secondary | ICD-10-CM

## 2017-08-28 DIAGNOSIS — Z7901 Long term (current) use of anticoagulants: Secondary | ICD-10-CM | POA: Diagnosis not present

## 2017-08-28 DIAGNOSIS — I200 Unstable angina: Secondary | ICD-10-CM

## 2017-08-28 DIAGNOSIS — I10 Essential (primary) hypertension: Secondary | ICD-10-CM

## 2017-08-28 DIAGNOSIS — Z952 Presence of prosthetic heart valve: Secondary | ICD-10-CM

## 2017-08-28 DIAGNOSIS — E785 Hyperlipidemia, unspecified: Secondary | ICD-10-CM

## 2017-08-28 DIAGNOSIS — I483 Typical atrial flutter: Secondary | ICD-10-CM

## 2017-08-28 DIAGNOSIS — R072 Precordial pain: Secondary | ICD-10-CM

## 2017-08-28 LAB — CBC WITH DIFFERENTIAL/PLATELET
Abs Immature Granulocytes: 0 10*3/uL (ref 0.0–0.1)
Basophils Absolute: 0.1 10*3/uL (ref 0.0–0.1)
Basophils Relative: 1 %
Eosinophils Absolute: 0.1 10*3/uL (ref 0.0–0.7)
Eosinophils Relative: 2 %
HCT: 45.3 % (ref 39.0–52.0)
Hemoglobin: 15.2 g/dL (ref 13.0–17.0)
Immature Granulocytes: 0 %
Lymphocytes Relative: 21 %
Lymphs Abs: 1.2 10*3/uL (ref 0.7–4.0)
MCH: 32.9 pg (ref 26.0–34.0)
MCHC: 33.6 g/dL (ref 30.0–36.0)
MCV: 98.1 fL (ref 78.0–100.0)
Monocytes Absolute: 0.6 10*3/uL (ref 0.1–1.0)
Monocytes Relative: 11 %
Neutro Abs: 3.8 10*3/uL (ref 1.7–7.7)
Neutrophils Relative %: 65 %
Platelets: 121 10*3/uL — ABNORMAL LOW (ref 150–400)
RBC: 4.62 MIL/uL (ref 4.22–5.81)
RDW: 13.3 % (ref 11.5–15.5)
WBC: 5.8 10*3/uL (ref 4.0–10.5)

## 2017-08-28 LAB — TROPONIN I
Troponin I: 0.08 ng/mL (ref <0.03)
Troponin I: 0.06 ng/mL (ref <0.03)
Troponin I: 0.07 ng/mL (ref <0.03)

## 2017-08-28 LAB — LIPID PANEL
Cholesterol: 149 mg/dL (ref 0–200)
HDL: 52 mg/dL (ref >40)
LDL Cholesterol: 78 mg/dL (ref 0–99)
Total CHOL/HDL Ratio: 2.9 RATIO
Triglycerides: 95 mg/dL (ref <150)
VLDL: 19 mg/dL (ref 0–40)

## 2017-08-28 LAB — ECHOCARDIOGRAM COMPLETE: Weight: 2846.58 oz

## 2017-08-28 LAB — PROTIME-INR
INR: 2.07
INR: 2.3
Prothrombin Time: 23.1 seconds — ABNORMAL HIGH (ref 11.4–15.2)
Prothrombin Time: 25.1 seconds — ABNORMAL HIGH (ref 11.4–15.2)

## 2017-08-28 MED ORDER — SODIUM CHLORIDE 0.9 % IV SOLN: INTRAVENOUS | Status: DC

## 2017-08-28 MED ORDER — METOPROLOL SUCCINATE ER 25 MG PO TB24
25.0000 mg | ORAL_TABLET | Freq: Every day | ORAL | Status: DC
  Administered 2017-08-28: 25 mg via ORAL
  Filled 2017-08-28 (×2): qty 1

## 2017-08-28 MED ORDER — SODIUM CHLORIDE 0.9% FLUSH: 3.0000 mL | INTRAVENOUS | Status: DC | PRN

## 2017-08-28 MED ORDER — AMLODIPINE BESYLATE 10 MG PO TABS
10.0000 mg | ORAL_TABLET | Freq: Every day | ORAL | Status: DC
  Administered 2017-08-28 – 2017-09-01 (×5): 10 mg via ORAL
  Filled 2017-08-28: qty 2
  Filled 2017-08-28 (×4): qty 1

## 2017-08-28 MED ORDER — WARFARIN SODIUM 5 MG PO TABS: 5.0000 mg | ORAL_TABLET | Freq: Once | ORAL | Status: DC

## 2017-08-28 MED ORDER — MORPHINE SULFATE (PF) 4 MG/ML IV SOLN: 2.0000 mg | INTRAVENOUS | Status: DC | PRN

## 2017-08-28 MED ORDER — GI COCKTAIL ~~LOC~~: 30.0000 mL | Freq: Four times a day (QID) | ORAL | Status: DC | PRN

## 2017-08-28 MED ORDER — NITROGLYCERIN 0.4 MG SL SUBL: 0.4000 mg | SUBLINGUAL_TABLET | SUBLINGUAL | Status: DC | PRN

## 2017-08-28 MED ORDER — FUROSEMIDE 40 MG PO TABS
40.0000 mg | ORAL_TABLET | Freq: Every day | ORAL | Status: DC
  Administered 2017-08-28: 40 mg via ORAL
  Filled 2017-08-28: qty 2

## 2017-08-28 MED ORDER — ONDANSETRON HCL 4 MG/2ML IJ SOLN
4.0000 mg | Freq: Four times a day (QID) | INTRAMUSCULAR | Status: DC | PRN
  Filled 2017-08-28: qty 2

## 2017-08-28 MED ORDER — ACETAMINOPHEN 325 MG PO TABS: 650.0000 mg | ORAL_TABLET | ORAL | Status: DC | PRN

## 2017-08-28 MED ORDER — ASPIRIN 81 MG PO CHEW
81.0000 mg | CHEWABLE_TABLET | ORAL | Status: AC
  Administered 2017-08-29: 81 mg via ORAL
  Filled 2017-08-28: qty 1

## 2017-08-28 MED ORDER — FESOTERODINE FUMARATE ER 8 MG PO TB24
8.0000 mg | ORAL_TABLET | Freq: Every evening | ORAL | Status: DC
  Administered 2017-08-28 – 2017-08-31 (×4): 8 mg via ORAL
  Filled 2017-08-28 (×4): qty 1

## 2017-08-28 MED ORDER — WARFARIN - PHARMACIST DOSING INPATIENT: Freq: Every day | Status: DC

## 2017-08-28 MED ORDER — SODIUM CHLORIDE 0.9 % IV SOLN: 250.0000 mL | INTRAVENOUS | Status: DC | PRN

## 2017-08-28 MED ORDER — SODIUM CHLORIDE 0.9 % IV SOLN
INTRAVENOUS | Status: DC
  Administered 2017-08-29: 06:00:00 via INTRAVENOUS

## 2017-08-28 MED ORDER — SODIUM CHLORIDE 0.9% FLUSH
3.0000 mL | Freq: Two times a day (BID) | INTRAVENOUS | Status: DC
  Administered 2017-08-28 – 2017-08-30 (×3): 3 mL via INTRAVENOUS

## 2017-08-28 MED ORDER — RAMIPRIL 10 MG PO CAPS
10.0000 mg | ORAL_CAPSULE | Freq: Two times a day (BID) | ORAL | Status: DC
  Administered 2017-08-28 – 2017-09-01 (×9): 10 mg via ORAL
  Filled 2017-08-28 (×9): qty 1

## 2017-08-28 MED ORDER — ISOSORBIDE MONONITRATE ER 60 MG PO TB24
60.0000 mg | ORAL_TABLET | Freq: Every day | ORAL | Status: DC
  Administered 2017-08-28 – 2017-09-01 (×5): 60 mg via ORAL
  Filled 2017-08-28 (×2): qty 1
  Filled 2017-08-28: qty 2
  Filled 2017-08-28 (×2): qty 1

## 2017-08-28 MED ORDER — PHYTONADIONE 5 MG PO TABS
5.0000 mg | ORAL_TABLET | Freq: Once | ORAL | Status: AC
  Administered 2017-08-28: 5 mg via ORAL
  Filled 2017-08-28: qty 1

## 2017-08-28 MED ORDER — ATORVASTATIN CALCIUM 20 MG PO TABS
20.0000 mg | ORAL_TABLET | Freq: Every evening | ORAL | Status: DC
  Administered 2017-08-28 – 2017-08-31 (×4): 20 mg via ORAL
  Filled 2017-08-28 (×4): qty 1

## 2017-08-28 MED ORDER — LORAZEPAM 1 MG PO TABS
1.0000 mg | ORAL_TABLET | Freq: Every day | ORAL | Status: DC
  Administered 2017-08-28 – 2017-08-31 (×4): 1 mg via ORAL
  Filled 2017-08-28 (×4): qty 1

## 2017-08-28 MED ORDER — MUPIROCIN 2 % EX OINT
1.0000 "application " | TOPICAL_OINTMENT | Freq: Two times a day (BID) | CUTANEOUS | Status: AC
  Administered 2017-08-28 – 2017-08-30 (×5): 1 via NASAL
  Filled 2017-08-28 (×4): qty 22

## 2017-08-28 MED ORDER — FUROSEMIDE 40 MG PO TABS
40.0000 mg | ORAL_TABLET | Freq: Every day | ORAL | Status: DC
  Administered 2017-08-30 – 2017-09-01 (×2): 40 mg via ORAL
  Filled 2017-08-28 (×2): qty 1

## 2017-08-28 NOTE — Progress Notes (Signed)
PROGRESS NOTE  Brief Narrative: Tyler Williams is a 84 y.o. male with a history of CAD s/p multiple PCI, TAVR 2018 by Dr. Burt Knack, AAA s/p remote repair, chronic HFpEF, and permanent AFib/AFlutter on coumadin who presented to the ED after an episode of chest pain/pressure which started abruptly while sitting watching television. He took NTG (though later noted this expired in 2013), ativan, and ASA 325mg  without much improvement and ultimately called EMS. In the ED he had elevated BP, ECG showing AFib/flutter with controlle ventricular rate and RBBB which is chronic. CXR demonstrated cardiomegaly with bibasilar atelectasis.   Subjective: Troponin has trended to 0.08 though chest pain has been improved.   Objective: BP (!) 188/73 (BP Location: Right Arm)   Pulse 60   Temp 98 F (36.7 C) (Oral)   Resp 16   SpO2 96%   Gen: Elderly, WDWN male in no distress Pulm: Decreased at the bases and nonlabored on room air  CV: RRR, no murmur, no JVD, no edema GI: Soft, NT, ND, +BS  Neuro: Alert and oriented. No focal deficits. Skin: No rashes, lesions no ulcers  Assessment & Plan: Chest pain with known CAD, RBBB: Continue cycling cardiac enzymes, repeat echocardiogram is pending. On imdur, beta blocker, was given aspirin, on statin, LDL is 78.  - Pt is to remain NPO pending cardiology evaluation.   AFib:  - Continue coumadin, INR therapeutic.  HTN:  - BP elevated, continue home medications this AM and monitor.   Pt's PCP is Dr. Reynaldo Minium. Otherwise, per H&P from this morning.   Patrecia Pour, MD 08/28/2017, 10:09 AM

## 2017-08-28 NOTE — Progress Notes (Signed)
ANTICOAGULATION CONSULT NOTE - Initial Consult  Pharmacy Consult for warfarin Indication: atrial fibrillation  No Known Allergies  Patient Measurements:    Vital Signs: Temp: 98 F (36.7 C) (07/15 2245) Temp Source: Oral (07/15 2245) BP: 170/71 (07/16 0700) Pulse Rate: 57 (07/16 0700)  Labs: Recent Labs    08/27/17 2255 08/28/17 0305 08/28/17 0350  HGB 15.0  --  15.2  HCT 44.4  --  45.3  PLT 114*  --  121*  LABPROT  --  23.1*  --   INR  --  2.07  --   CREATININE 1.01  --   --   TROPONINI  --   --  0.08*    CrCl cannot be calculated (Unknown ideal weight.).   Medical History: Past Medical History:  Diagnosis Date  . A-fib ([place])   . Coronary artery disease   . Hypertension    Assessment: 45 yom presented to the ED with CP. He is on chronic coumadin for history of afib. INR is therapeutic at 2.07. Hgb is WNL and platelets are slightly low at 121. No bleeding noted.  PTA warfarin dose: 5mg  daily except 7.5mg  on Monday's  Goal of Therapy:  INR 2-3 Monitor platelets by anticoagulation protocol: Yes   Plan:  Warfarin 5mg  PO x 1 tonight Daily INR  Jaquay Morneault, Rande Lawman 08/28/2017,7:16 AM

## 2017-08-28 NOTE — Telephone Encounter (Signed)
To Dr. Cooper as FYI. 

## 2017-08-28 NOTE — H&P (Addendum)
History and Physical    Tyler Williams XHB:[id number] DOB: 20-Mar-1926 DOA: 08/27/2017  Referring MD/NP/PA: Dr. Thayer Jew PCP: Patient, No Pcp Per  Patient coming from: Home via EMS  Chart currently being merged.  Chief Complaint: Chest pressure  I have personally briefly reviewed patient's old medical records in [place]   HPI: Tyler Williams is a 84 y.o. male with medical history significant of HTN, A. fib on Coumadin, CAD, s/p TAVR in 2018, AAA s/p repair, and GERD; who presents with complaints of centralized chest pressure while watching TV last night after eating his dinner.  Pain l was constant.  Denies having any associated diaphoresis, nausea, vomiting, or shortness of breath.  He took aspirin his nightly Ativan without leaf of symptoms.  After symptoms persisted for more than an hour he called 911.  He was instructed to take an additional aspirin.  Patient is followed by Dr. Sherren Mocha in the outpatient setting.   ED Course: Upon admission into the emergency department patient was noted to be afebrile, pulse 55-65, respirations 15-21, pressure 150/62-187/74, and O2 saturation 96-98%.  Labs revealed platelets 114, BUN 24, creatinine 1.01, and troponin 0.04.  EKG revealed atrial flutter 4-1.  Chest x-ray showing cardiomegaly with TAVR bibasilar atelectasis.  Given patient's risk factors TRH called to admit for chest pain rule out.  Review of Systems  Constitutional: Negative for chills, diaphoresis, fever and malaise/fatigue.  HENT: Negative for congestion and nosebleeds.   Eyes: Negative for photophobia and pain.  Respiratory: Negative for cough and shortness of breath.   Cardiovascular: Positive for chest pain and leg swelling.  Gastrointestinal: Negative for abdominal pain, nausea and vomiting.  Genitourinary: Negative for dysuria and hematuria.  Musculoskeletal: Negative for falls.  Skin: Negative for itching and rash.  Neurological: Negative for focal  weakness and loss of consciousness.  Psychiatric/Behavioral: Negative for memory loss and substance abuse.    Past Medical History:  Diagnosis Date  . A-fib ([place])   . Coronary artery disease   . Hypertension     Past Surgical History:  Procedure Laterality Date  . CHOLECYSTECTOMY    . TOTAL KNEE ARTHROPLASTY Left      reports that he has never smoked. He has never used smokeless tobacco. He reports that he drank alcohol. He reports that he does not use drugs.  No Known Allergies  Family history of heart disease. patient's mother died of heart attack at the age of 72.  Prior to Admission medications   Medication Sig Start Date End Date Taking? Authorizing Provider  amLODipine (NORVASC) 10 MG tablet Take 10 mg by mouth daily.   Yes [provider]  aspirin 81 MG chewable tablet Chew 162 mg by mouth once.   Yes [provider]  atorvastatin (LIPITOR) 20 MG tablet Take 20 mg by mouth every evening.   Yes [provider]  Cholecalciferol (VITAMIN D3) 5000 units TABS Take 1 tablet by mouth daily.   Yes [provider]  Coenzyme Q10 (COQ-10) 10 MG CAPS Take 10 mg by mouth daily.   Yes [provider]  fesoterodine (TOVIAZ) 8 MG TB24 tablet Take 8 mg by mouth every evening.   Yes [provider]  furosemide (LASIX) 40 MG tablet Take 40 mg by mouth daily.   Yes [provider]  isosorbide mononitrate (IMDUR) 60 MG 24 hr tablet Take 60 mg by mouth daily.   Yes [provider]  LORazepam (ATIVAN) 1 MG tablet Take 1  mg by mouth at bedtime.   Yes [provider]  metoprolol succinate (TOPROL-XL) 25 MG 24 hr tablet Take 25 mg by mouth daily.   Yes [provider]  multivitamin-lutein (OCUVITE-LUTEIN) CAPS capsule Take 1 capsule by mouth daily.   Yes [provider]  nitroGLYCERIN (NITROSTAT) 0.4 MG SL tablet Place 0.4 mg under the tongue every 5 (five) minutes as needed for chest pain.   Yes  [provider]  Omega-3 Fatty Acids (FISH OIL PO) Take 1 tablet by mouth daily.   Yes [provider]  ramipril (ALTACE) 10 MG capsule Take 10 mg by mouth 2 (two) times daily.   Yes [provider]  ranibizumab (LUCENTIS) 0.5 MG/0.05ML SOLN 0.5 mg by Intravitreal route See admin instructions. Every 9 weeks   Yes [provider]  warfarin (COUMADIN) 5 MG tablet Take 5-7.5 mg by mouth See admin instructions. Take 1 and 1/2 tablets on Monday then take 1 tablet all the other days   Yes [provider]    Physical Exam:  Constitutional: Elderly male in NAD, calm, comfortable Vitals:   08/28/17 0045 08/28/17 0100 08/28/17 0115 08/28/17 0130  BP: (!) 150/62 (!) 160/64 (!) 187/74 (!) 157/67  Pulse: (!) 57 (!) 55 65 (!) 56  Resp: 17 17 (!) 21 16  Temp:      TempSrc:      SpO2: 98% 98% 96% 97%   Eyes: PERRL, lids and conjunctivae normal ENMT: Mucous membranes are moist. Posterior pharynx clear of any exudate or lesions.  Neck: normal, supple, no masses, no thyromegaly Respiratory: clear to auscultation bilaterally, no wheezing, no crackles. Normal respiratory effort. No accessory muscle use.  Cardiovascular: Regular irregular rhythm, no murmurs / rubs / gallops. No extremity edema. 2+ pedal pulses. No carotid bruits.  Abdomen: no tenderness, no masses palpated. No hepatosplenomegaly. Bowel sounds positive.  Musculoskeletal: no clubbing / cyanosis. No joint deformity upper and lower extremities. Good ROM, no contractures. Normal muscle tone.  Skin: no rashes, lesions, ulcers. No induration Neurologic: CN 2-12 grossly intact. Sensation intact, DTR normal. Strength 5/5 in all 4.  Psychiatric: Normal judgment and insight. Alert and oriented x 3. Normal mood.     Labs on Admission: I have personally reviewed following labs and imaging studies  CBC: Recent Labs  Lab 08/27/17 2255  WBC 6.1  HGB 15.0  HCT 44.4  MCV 98.0  PLT 114*   Basic  Metabolic Panel: Recent Labs  Lab 08/27/17 2255  NA 140  K 4.0  CL 108  CO2 24  GLUCOSE 156*  BUN 24*  CREATININE 1.01  CALCIUM 9.0   GFR: CrCl cannot be calculated (Unknown ideal weight.). Liver Function Tests: No results for input(s): AST, ALT, ALKPHOS, BILITOT, PROT, ALBUMIN in the last 168 hours. No results for input(s): LIPASE, AMYLASE in the last 168 hours. No results for input(s): AMMONIA in the last 168 hours. Coagulation Profile: No results for input(s): INR, PROTIME in the last 168 hours. Cardiac Enzymes: No results for input(s): CKTOTAL, CKMB, CKMBINDEX, TROPONINI in the last 168 hours. BNP (last 3 results) No results for input(s): PROBNP in the last 8760 hours. HbA1C: No results for input(s): HGBA1C in the last 72 hours. CBG: No results for input(s): GLUCAP in the last 168 hours. Lipid Profile: No results for input(s): CHOL, HDL, LDLCALC, TRIG, CHOLHDL, LDLDIRECT in the last 72 hours. Thyroid Function Tests: No results for input(s): TSH, T4TOTAL, FREET4, T3FREE, THYROIDAB in the last 72 hours. Anemia Panel:  No results for input(s): VITAMINB12, FOLATE, FERRITIN, TIBC, IRON, RETICCTPCT in the last 72 hours. Urine analysis: No results found for: COLORURINE, APPEARANCEUR, LABSPEC, PHURINE, GLUCOSEU, HGBUR, BILIRUBINUR, KETONESUR, PROTEINUR, UROBILINOGEN, NITRITE, LEUKOCYTESUR Sepsis Labs: No results found for this or any previous visit (from the past 240 hour(s)).   Radiological Exams on Admission: Dg Chest 2 View  Result Date: 08/27/2017 CLINICAL DATA:  Chest pain starting at 8 this evening. EXAM: CHEST - 2 VIEW COMPARISON:  None. FINDINGS: Cardiomegaly with moderate aortic atherosclerosis at the arch. Evidence of prior TAVR. No acute pulmonary consolidation or CHF. Bibasilar atelectasis is noted. Mild degenerative change along thoracic spine. IMPRESSION: Cardiomegaly with aortic atherosclerosis. TAVR. Bibasilar atelectasis. Electronically Signed   By: Ashley Royalty  M.D.   On: 08/27/2017 23:18    EKG: Independently reviewed.  Atrial flutter 4:1 AV block with RBBB  Assessment/Plan Chest pain: Acute.  Patient presents with centralized chest pressure following his dinner last night.  Initial troponin negative.  - Admit to a telemetry bed - Trend cardiac troponins - Check lipid panel - Check echocardiogram - Message for cardiology to eval in a.m.  A. Fib/a. flutter on anticoagulation: Chronic. - Follow-up PT/INR - Continue Coumadin  Hypertension  - Continue metoprolol, amlodipine, furosemide, and ramipril  Hyperlipidemia - Continue atorvastatin   DVT prophylaxis: coumadin Code Status: Full  Family Communication: No family present at bedside Disposition Plan: Likely discharge home if work-up negative Consults called: none  Admission status:observation  Norval Morton MD [facility] Pager [phone]   If 7PM-7AM, please contact night-coverage [email or web address] Password [facility]  08/28/2017, 3:14 AM

## 2017-08-28 NOTE — Telephone Encounter (Signed)
New Message     Patient daughter is calling to advise that he was taking to the ER last night and is waiting to be admitted.  Patient daughter was told that his heart enzyme are increasing. She just wanted to make Dr. Burt Knack aware.

## 2017-08-28 NOTE — Progress Notes (Signed)
  Echocardiogram 2D Echocardiogram has been performed.  Tyler Williams 08/28/2017, 3:42 PM

## 2017-08-28 NOTE — Consult Note (Signed)
Cardiology Consultation:   Patient ID: Tyler Williams; [id number]; 1926/06/12   Admit date: 08/27/2017 Date of Consult: 08/28/2017  Primary Care Provider: Burnard Bunting, MD Primary Cardiologist: Sherren Mocha, MD   Patient Profile:   Tyler Williams is a 84 y.o. male with a hx of aortic valve disease status post TAVR in May 2018, permanent atrial fibrillation on coumadin, AAA s/p remote repair, chronic diastolic CHF and coronary artery disease who is being seen today for the evaluation of chest pain at the request of Dr. Bonner Puna.  History of Present Illness:   Tyler Williams presented to the emergency department yesterday after an episode of chest pain/pressure that started abruptly while watching television.  He reportedly took sublingual nitroglycerin which had been expired since 2013, Ativan and aspirin 325 mg without much improvement.  She called EMS who transported him to the emergency department where his blood pressure was elevated and EKG showed A. fib/flutter with controlled rate.  Initial point-of-care troponin was normal.  Follow-up troponin early this morning was 0.08.  Renal function was normal with serum creatinine 1.01.  Potassium was normal at 4.0.  Chest x-ray showed cardiomegaly, prior TAVR, bibasilar atelectasis.  Tyler Williams has known 95% stenosed mid RCA seen on last cath in 04/2016 that was unchanged from 2014. He was last seen in our office on 06/20/2017 by Dr. Burt Knack at which time he was volume overloaded but asymptomatic.  He had leg edema and elevated neck veins.  He was started on Lasix 40 mg daily and his low-dose hydrochlorothiazide was discontinued.  On my assessment the patient is lying flat in the bed and appears comfortable.  He tells me that he was sitting down watching television and eating an ice cream sandwich last evening when he developed central chest pressure, nonradiating and with no associated symptoms.  This lasted for 1-1/2 to 2 hours.  He did take aspirin,  lorazepam and he thinks possibly times without any improvement.  His blood pressure at home was in the 180s over 70s.  He called EMS due to lack of improvement.  He says that his pain finally resolved in the ambulance and has not returned.  He has never had this discomfort before.  He says that he is active at home working out in the yard and taking care of lots of flowers, caring for his swimming pool, walking outside and using his stationary bike occasionally.  The other day he walked 20 times around his swimming pool.  He has had no exertional chest discomfort or shortness of breath.  He denies orthopnea, PND, palpitations.  He has mild pedal edema at the end of the day that is resolved by morning.   Past Medical History:  Diagnosis Date  . A-fib ([place])   . Coronary artery disease   . Hypertension     Past Surgical History:  Procedure Laterality Date  . CHOLECYSTECTOMY    . TOTAL KNEE ARTHROPLASTY Left      Home Medications:  Prior to Admission medications   Medication Sig Start Date End Date Taking? Authorizing Provider  amLODipine (NORVASC) 10 MG tablet Take 10 mg by mouth daily.   Yes [provider]  aspirin 81 MG chewable tablet Chew 162 mg by mouth once.   Yes [provider]  atorvastatin (LIPITOR) 20 MG tablet Take 20 mg by mouth every evening.   Yes [provider]  Cholecalciferol (VITAMIN D3) 5000 units TABS Take 1 tablet by mouth daily.   Yes [provider]  Coenzyme Q10 (COQ-10) 10 MG CAPS Take 10 mg by mouth daily.   Yes [provider]  fesoterodine (TOVIAZ) 8 MG TB24 tablet Take 8 mg by mouth every evening.   Yes [provider]  furosemide (LASIX) 40 MG tablet Take 40 mg by mouth daily.   Yes [provider]  isosorbide mononitrate (IMDUR) 60 MG 24 hr tablet Take 60 mg by mouth daily.   Yes [provider]  LORazepam (ATIVAN) 1 MG tablet Take 1 mg by mouth at bedtime.   Yes [provider]    metoprolol succinate (TOPROL-XL) 25 MG 24 hr tablet Take 25 mg by mouth daily.   Yes [provider]  multivitamin-lutein (OCUVITE-LUTEIN) CAPS capsule Take 1 capsule by mouth daily.   Yes [provider]  nitroGLYCERIN (NITROSTAT) 0.4 MG SL tablet Place 0.4 mg under the tongue every 5 (five) minutes as needed for chest pain.   Yes [provider]  Omega-3 Fatty Acids (FISH OIL PO) Take 1 tablet by mouth daily.   Yes [provider]  ramipril (ALTACE) 10 MG capsule Take 10 mg by mouth 2 (two) times daily.   Yes [provider]  ranibizumab (LUCENTIS) 0.5 MG/0.05ML SOLN 0.5 mg by Intravitreal route See admin instructions. Every 9 weeks   Yes [provider]  warfarin (COUMADIN) 5 MG tablet Take 5-7.5 mg by mouth See admin instructions. Take 1 and 1/2 tablets on Monday then take 1 tablet all the other days   Yes [provider]    Inpatient Medications: Scheduled Meds: . amLODipine  10 mg Oral Daily  . atorvastatin  20 mg Oral QPM  . fesoterodine  8 mg Oral QPM  . furosemide  40 mg Oral Daily  . isosorbide mononitrate  60 mg Oral Daily  . LORazepam  1 mg Oral QHS  . metoprolol succinate  25 mg Oral Daily  . ramipril  10 mg Oral BID  . warfarin  5 mg Oral ONCE-1800  . Warfarin - Pharmacist Dosing Inpatient   Does not apply q1800   Continuous Infusions:  PRN Meds: acetaminophen, gi cocktail, morphine injection, ondansetron (ZOFRAN) IV  Allergies:   No Known Allergies  Social History:   Social History   Socioeconomic History  . Marital status: Single    Spouse name: Not on file  . Number of children: Not on file  . Years of education: Not on file  . Highest education level: Not on file  Occupational History  . Not on file  Social Needs  . Financial resource strain: Not on file  . Food insecurity:    Worry: Not on file    Inability: Not on file  . Transportation needs:    Medical: Not on file    Non-medical: Not  on file  Tobacco Use  . Smoking status: Never Smoker  . Smokeless tobacco: Never Used  Substance and Sexual Activity  . Alcohol use: Not Currently    Frequency: Never  . Drug use: Never  . Sexual activity: Not on file  Lifestyle  . Physical activity:    Days per week: Not on file    Minutes per session: Not on file  . Stress: Not on file  Relationships  . Social connections:    Talks on phone: Not on file    Gets together: Not on file    Attends religious service: Not on file    Active member of club or organization: Not on  file    Attends meetings of clubs or organizations: Not on file    Relationship status: Not on file  . Intimate partner violence:    Fear of current or ex partner: Not on file    Emotionally abused: Not on file    Physically abused: Not on file    Forced sexual activity: Not on file  Other Topics Concern  . Not on file  Social History Narrative  . Not on file    Family History:   History reviewed. No pertinent family history.   ROS:  Please see the history of present illness.   All other ROS reviewed and negative.     Physical Exam/Data:   Vitals:   08/28/17 0930 08/28/17 0945 08/28/17 0959 08/28/17 1207  BP: (!) 172/69 (!) 183/89 (!) 188/73   Pulse: (!) 57 69 60   Resp: 18 20 16    Temp:   98 F (36.7 C)   TempSrc:   Oral   SpO2: 98% 97% 96%   Weight:    177 lb 14.6 oz (80.7 kg)    Intake/Output Summary (Last 24 hours) at 08/28/2017 1214 Last data filed at 08/28/2017 1050 Gross per 24 hour  Intake -  Output 1550 ml  Net -1550 ml   Filed Weights   08/28/17 1207  Weight: 177 lb 14.6 oz (80.7 kg)   There is no height or weight on file to calculate BMI.  General:  Well nourished, well developed, elderly male in no acute distress HEENT: normal Lymph: no adenopathy Neck: ? mild JVD Vascular: No carotid bruits; pedal pulses 2+  Cardiac:  normal S1, S2; RRR; no murmur  Lungs:  clear to auscultation bilaterally, no wheezing or rhonchi,  few crackles in the bases Abd: soft, nontender, no hepatomegaly  Ext: no edema Musculoskeletal:  No deformities, BUE and BLE strength normal and equal Skin: warm and dry  Neuro:  CNs 2-12 intact, no focal abnormalities noted Psych:  Normal affect   EKG:  The EKG was personally reviewed and demonstrates: Atrial flutter with 4:1 conduction, known right bundle branch block Telemetry:  Telemetry was personally reviewed and demonstrates: Atrial fibrillation with rates in the 50s-60s  Relevant CV Studies:  Echocardiogram 06/20/2017 Study Conclusions  - Left ventricle: The cavity size was normal. Systolic function was normal. The estimated ejection fraction was in the range of 60% to 65%. There is akinesis of the basal-midinferoseptal myocardium. - Aortic valve: 29 mm Edwards Sapien 3 transcatheter heart valve placed via percutaneous right transfemoral approach. Peak velocity (S): 187 cm/s. Mean gradient (S): 7 mm Hg. Valve area (VTI): 1.48 cm^2. Valve area (Vmax): 1.51 cm^2. Valve area (Vmean): 1.47 cm^2. - Mitral valve: Calcified annulus. Mildly thickened leaflets .   There was mild regurgitation. - Left atrium: The atrium was severely dilated. - Right ventricle: The cavity size was mildly dilated. Wall   thickness was normal. - Right atrium: The atrium was severely dilated. - Tricuspid valve: There was mild regurgitation. - Pulmonary arteries: Systolic pressure was mildly to moderately increased. PA peak pressure: 53 mm Hg (S).  Laboratory Data:  Chemistry Recent Labs  Lab 08/27/17 2255  NA 140  K 4.0  CL 108  CO2 24  GLUCOSE 156*  BUN 24*  CREATININE 1.01  CALCIUM 9.0  GFRNONAA >60  GFRAA >60  ANIONGAP 8    No results for input(s): PROT, ALBUMIN, AST, ALT, ALKPHOS, BILITOT in the last 168 hours. Hematology Recent Labs  Lab 08/27/17 2255 08/28/17  0350  WBC 6.1 5.8  RBC 4.53 4.62  HGB 15.0 15.2  HCT 44.4 45.3  MCV 98.0 98.1  MCH 33.1 32.9  MCHC 33.8 33.6  RDW 13.3  13.3  PLT 114* 121*   Cardiac Enzymes Recent Labs  Lab 08/28/17 0350  TROPONINI 0.08*    Recent Labs  Lab 08/27/17 2303  TROPIPOC 0.04    BNPNo results for input(s): BNP, PROBNP in the last 168 hours.  DDimer No results for input(s): DDIMER in the last 168 hours.  Radiology/Studies:  Dg Chest 2 View  Result Date: 08/27/2017 CLINICAL DATA:  Chest pain starting at 8 this evening. EXAM: CHEST - 2 VIEW COMPARISON:  None. FINDINGS: Cardiomegaly with moderate aortic atherosclerosis at the arch. Evidence of prior TAVR. No acute pulmonary consolidation or CHF. Bibasilar atelectasis is noted. Mild degenerative change along thoracic spine. IMPRESSION: Cardiomegaly with aortic atherosclerosis. TAVR. Bibasilar atelectasis. Electronically Signed   By: Ashley Royalty M.D.   On: 08/27/2017 23:18    Assessment and Plan:   Chest pain -Most recent heart cath in 04/2016- patient has known severe RCA stenosis with heavy calcification unchanged from 2014 study, Minor nonobstructive LAD and LCx stenosis  -He presented for nonexertional, nonradiating chest pressure with no associated symptoms lasting about 1 1/2-2 hours last evening.  Symptoms resolved on route to the hospital and have not returned.  He is fairly active with no recent exertional chest discomfort or shortness of breath. -Chest x-ray without acute findings -Troponin mildly elevated at 0.08 -Case discussed with Dr. Johnsie Cancel.  With his known RCA stenosis and new chest pain, we will plan for cardiac catheterization tomorrow.  Cannot do cardiac cath today due to anticoagulation and INR of 2. Will give vitamin K 5 mg and check INR tonight and in the morning.   Status post TAVR 06/2016 -Recent echo 06/20/2017 per Dr. Burt Knack: LV function appears normal with an inferior wall motion abnormality but preserved LVEF.  Transcatheter aortic valve function is normal with trivial aortic regurgitation (paravalvular) and normal gradients with a mean of 8 and peak of  15 mmHg.  -We will check echocardiogram to assess valve function related to his TAVR.  Chronic diastolic heart failure:  -Most recent echocardiogram 06/20/2017 showed LVEF 60-65%, PA peak pressure: 53 mm Hg (S). -Started on Lasix and in May  -Patient currently appears euvolemic -Continue Toprol-XL, ACE inhibitor, Lasix.  Will hold Lasix in the morning in anticipation of cardiac cath  Permanent atrial fibrillation -Rate is well controlled in the 50s-60s on beta-blocker -On Coumadin for stroke risk reduction with therapeutic INR of 2.07 -We will discontinue Coumadin in anticipation of cardiac cath. Vitamin K ordered for reversal and plan for cath tomorrow.  Will place order for pharmacy to start heparin once INR less than 2.  Hypertension -Blood pressure was elevated at presentation and continues to be elevated 150s-180s/60s -Home medications include amlodipine 10 mg daily, Lasix 40 mg daily, Imdur 60 mg daily, Toprol XL 25 mg daily, ramipril 10 mg daily which are currently continued  Hyperlipidemia -LDL is 78 on atorvastatin 20 mg daily  For questions or updates, please contact [place] Please consult [email or web address] for contact info under Cardiology/STEMI.   Signed, Daune Perch, NP  08/28/2017 12:14 PM

## 2017-08-28 NOTE — ED Notes (Signed)
pts daughter requesting to speak to a provider about blood work and "what the heck we are waiting on and whether or not he is going to be admitted because this is taking forever and it's getting late."

## 2017-08-28 NOTE — Progress Notes (Signed)
ANTICOAGULATION CONSULT NOTE - Initial Consult  Pharmacy Consult for heparin Indication: atrial fibrillation  No Known Allergies  Patient Measurements: Weight: 177 lb 14.6 oz (80.7 kg) Heparin Dosing Weight:   Vital Signs: Temp: 98 F (36.7 C) (07/16 0959) Temp Source: Oral (07/16 0959) BP: 188/73 (07/16 0959) Pulse Rate: 60 (07/16 0959)  Labs: Recent Labs    08/27/17 2255 08/28/17 0305 08/28/17 0350  HGB 15.0  --  15.2  HCT 44.4  --  45.3  PLT 114*  --  121*  LABPROT  --  23.1*  --   INR  --  2.07  --   CREATININE 1.01  --   --   TROPONINI  --   --  0.08*    CrCl cannot be calculated (Unknown ideal weight.).   Medical History: Past Medical History:  Diagnosis Date  . A-fib ([place])   . Coronary artery disease   . Hypertension     Assessment: 84 yo M presents with CP. On Coumadin PTA for hx of Afib. INR therapeutic at 2.07. Now Cards plans to take to cath so holding Coumadin and will use heparin when INR < 2. Hgb wnl and plts low at 121.  Goal of Therapy:  Heparin level 0.3-0.7 units/ml Monitor platelets by anticoagulation protocol: Yes   Plan:  Giving Vit K 5 mg PO x 1 per Cards Start heparin gtt when INR < 2 Monitor daily INR, CBC, s/s of bleed  Quintrell Baze J 08/28/2017,1:20 PM

## 2017-08-28 NOTE — Care Management Note (Signed)
Case Management Note  Patient Details  Name: Tyler Williams MRN: [id number] Date of Birth: 1926-12-24  Subjective/Objective:    From home with spouse, presents with chest pain, has been on coumadin at home.  Plan for cath tomorrow.                Action/Plan: Cath on 7/17, NCM will follow for dc needs.  Expected Discharge Date:                  Expected Discharge Plan:  Home/Self Care  In-House Referral:     Discharge planning Services  CM Consult  Post Acute Care Choice:    Choice offered to:     DME Arranged:    DME Agency:     HH Arranged:    HH Agency:     Status of Service:  In process, will continue to follow  If discussed at Long Length of Stay Meetings, dates discussed:    Additional Comments:  Zenon Mayo, RN 08/28/2017, 4:37 PM

## 2017-08-28 NOTE — Progress Notes (Signed)
Tyler Williams admitted to 2W29 from ED with diagnosis of chest pain resolved with ASA. Patient stable on transport. Care transferred to [facility], RN.

## 2017-08-29 DIAGNOSIS — I10 Essential (primary) hypertension: Secondary | ICD-10-CM | POA: Diagnosis not present

## 2017-08-29 DIAGNOSIS — I200 Unstable angina: Secondary | ICD-10-CM | POA: Diagnosis not present

## 2017-08-29 DIAGNOSIS — Z7901 Long term (current) use of anticoagulants: Secondary | ICD-10-CM | POA: Diagnosis not present

## 2017-08-29 DIAGNOSIS — Z952 Presence of prosthetic heart valve: Secondary | ICD-10-CM | POA: Diagnosis not present

## 2017-08-29 DIAGNOSIS — E785 Hyperlipidemia, unspecified: Secondary | ICD-10-CM | POA: Diagnosis not present

## 2017-08-29 LAB — PROTIME-INR
INR: 1.99
INR: 1.76
Prothrombin Time: 22.4 seconds — ABNORMAL HIGH (ref 11.4–15.2)
Prothrombin Time: 20.3 seconds — ABNORMAL HIGH (ref 11.4–15.2)

## 2017-08-29 LAB — CBC
HCT: 46.4 % (ref 39.0–52.0)
Hemoglobin: 15.8 g/dL (ref 13.0–17.0)
MCH: 33 pg (ref 26.0–34.0)
MCHC: 34.1 g/dL (ref 30.0–36.0)
MCV: 96.9 fL (ref 78.0–100.0)
Platelets: 119 10*3/uL — ABNORMAL LOW (ref 150–400)
RBC: 4.79 MIL/uL (ref 4.22–5.81)
RDW: 13.2 % (ref 11.5–15.5)
WBC: 6.7 10*3/uL (ref 4.0–10.5)

## 2017-08-29 LAB — SURGICAL PCR SCREEN
MRSA, PCR: NEGATIVE
Staphylococcus aureus: NEGATIVE

## 2017-08-29 LAB — HEPARIN LEVEL (UNFRACTIONATED)
Heparin Unfractionated: <0.1 IU/mL — ABNORMAL LOW (ref 0.30–0.70)
Heparin Unfractionated: 0.61 IU/mL (ref 0.30–0.70)

## 2017-08-29 MED ORDER — METOPROLOL SUCCINATE ER 25 MG PO TB24
25.0000 mg | ORAL_TABLET | Freq: Every day | ORAL | Status: DC
  Administered 2017-08-30 – 2017-09-01 (×3): 25 mg via ORAL
  Filled 2017-08-29 (×3): qty 1

## 2017-08-29 MED ORDER — HEPARIN BOLUS VIA INFUSION
3000.0000 [IU] | Freq: Once | INTRAVENOUS | Status: AC
  Administered 2017-08-29: 3000 [IU] via INTRAVENOUS
  Filled 2017-08-29: qty 3000

## 2017-08-29 MED ORDER — HEPARIN (PORCINE) IN NACL 100-0.45 UNIT/ML-% IJ SOLN
950.0000 [IU]/h | INTRAMUSCULAR | Status: DC
  Administered 2017-08-29: 950 [IU]/h via INTRAVENOUS
  Administered 2017-08-30: 1200 [IU]/h via INTRAVENOUS
  Administered 2017-08-31: 950 [IU]/h via INTRAVENOUS
  Filled 2017-08-29 (×3): qty 250

## 2017-08-29 MED ORDER — VITAMIN K1 10 MG/ML IJ SOLN
10.0000 mg | Freq: Once | INTRAMUSCULAR | Status: AC
  Administered 2017-08-29: 10 mg via SUBCUTANEOUS
  Filled 2017-08-29: qty 1

## 2017-08-29 NOTE — Care Management Obs Status (Signed)
MEDICARE OBSERVATION STATUS NOTIFICATION   Patient Details  Name: Tyler Williams MRN: [id number] Date of Birth: October 14, 1926   Medicare Observation Status Notification Given:  Yes    Zenon Mayo, RN 08/29/2017, 8:41 AM

## 2017-08-29 NOTE — Progress Notes (Signed)
Interventional cardiology:  I reviewed history and discussed with patient and family. INR at noon today still 20.3 with INR 1.76. I think this is too high to attempt femoral access particularly at his age and with significant PAD and prior bifemoral bypass grafts. He has known severe stenosis in the RCA which is heavily calcified. Attempt previously unsuccessful due to inability to cross the lesion with a balloon. I think there is a good chance that we could open this with a support catheter and atherectomy. We would need femoral access to have adequate support for this. I offered to do a diagnostic angiogram today from a left radial approach but we would not be able to approach the RCA stenosis from this access. After discussion patient is amenable to waiting until INR is down and I have availability on the scheduled Friday morning at which point we will be able to address his disease without limitation.   Athalee Esterline Martinique MD, [facility]

## 2017-08-29 NOTE — Progress Notes (Signed)
[place] for Heparin when INR <2 (warfarin on hold) Indication: atrial fibrillation, CP  No Known Allergies  Patient Measurements: Height: 5\' 8"  (172.7 cm) Weight: 176 lb 9.6 oz (80.1 kg) IBW/kg (Calculated) : 68.4 Heparin Dosing Weight:   Vital Signs: Temp: 98.5 F (36.9 C) (07/17 0723) Temp Source: Oral (07/17 0723) BP: 171/63 (07/17 0723) Pulse Rate: 56 (07/17 0723)  Labs: Recent Labs    08/27/17 2255  08/28/17 0350 08/28/17 1540 08/28/17 2112 08/29/17 0245 08/29/17 1149  HGB 15.0  --  15.2  --   --   --  15.8  HCT 44.4  --  45.3  --   --   --  46.4  PLT 114*  --  121*  --   --   --  119*  LABPROT  --    < >  --   --  25.1* 22.4* 20.3*  INR  --    < >  --   --  2.30 1.99 1.76  HEPARINUNFRC  --   --   --   --   --   --  <0.10*  CREATININE 1.01  --   --   --   --   --   --   TROPONINI  --   --  0.08* 0.06* 0.07*  --   --    < > = values in this interval not displayed.    Estimated Creatinine Clearance: 48 mL/min (by C-G formula based on SCr of 1.01 mg/dL).   Medical History: Past Medical History:  Diagnosis Date  . A-fib ([place])   . Coronary artery disease   . Hypertension     Assessment: 84 yo M presents with CP. On Coumadin 5mg  daily exc for 7.5mg  on Mon PTA for hx of Afib. INR therapeutic at 2.07 on admit. Now Cards plans to take to cath so holding Coumadin, started heparin since INR 1.99. Given two doses of Vit K. Last heparin level is undetectable. Hgb wnl and plts low at 121.  Goal of Therapy:  Heparin level 0.3-0.7 units/ml Monitor platelets by anticoagulation protocol: Yes   Plan:  Give heparin 3,000 unit bolus Increase heparin gtt to 1,200 units/hr Monitor daily INR, CBC, s/s of bleed  Elenor Quinones, PharmD, BCPS Clinical Pharmacist Phone number [phone] 08/29/2017 1:23 PM

## 2017-08-29 NOTE — Progress Notes (Signed)
Pt bradying down while asleep. Maintaining mostly in the 30's and 40's with the lowest instance being 25. The patient is asymptomatic, asleep, and their HR returns to the 50's and 60's when they are woken up. On call NP Schorr notified. No new orders at this time.

## 2017-08-29 NOTE — Progress Notes (Signed)
Subjective:  Denies SSCP, palpitations or Dyspnea Really wants to have cath done today   Objective:  Vitals:   08/28/17 2148 08/28/17 2348 08/29/17 0600 08/29/17 0723  BP:  (!) 141/62  (!) 171/63  Pulse:  (!) 48  (!) 56  Resp:  17  14  Temp:  97.9 F (36.6 C)  98.5 F (36.9 C)  TempSrc:  Oral  Oral  SpO2:  92%  98%  Weight:   176 lb 9.6 oz (80.1 kg)   Height: 5\' 8"  (1.727 m)       Intake/Output from previous day:  Intake/Output Summary (Last 24 hours) at 08/29/2017 3295 Last data filed at 08/29/2017 0600 Gross per 24 hour  Intake 12.72 ml  Output 1550 ml  Net -1537.28 ml    Physical Exam: Affect appropriate Healthy:  appears stated age HEENT: normal Neck supple with no adenopathy JVP normal no bruits no thyromegaly Lungs clear with no wheezing and good diaphragmatic motion Heart:  S1/S2 no murmur, no rub, gallop or click PMI normal Abdomen: benighn, BS positve, no tenderness, no AAA no bruit.  No HSM or HJR Distal pulses intact with no bruits No edema Neuro non-focal Skin warm and dry No muscular weakness   Lab Results: Basic Metabolic Panel: Recent Labs    08/27/17 2255  NA 140  K 4.0  CL 108  CO2 24  GLUCOSE 156*  BUN 24*  CREATININE 1.01  CALCIUM 9.0   Liver Function Tests: No results for input(s): AST, ALT, ALKPHOS, BILITOT, PROT, ALBUMIN in the last 72 hours. No results for input(s): LIPASE, AMYLASE in the last 72 hours. CBC: Recent Labs    08/27/17 2255 08/28/17 0350  WBC 6.1 5.8  NEUTROABS  --  3.8  HGB 15.0 15.2  HCT 44.4 45.3  MCV 98.0 98.1  PLT 114* 121*   Cardiac Enzymes: Recent Labs    08/28/17 0350 08/28/17 1540 08/28/17 2112  TROPONINI 0.08* 0.06* 0.07*   Fasting Lipid Panel: Recent Labs    08/28/17 0350  CHOL 149  HDL 52  LDLCALC 78  TRIG 95  CHOLHDL 2.9    Imaging: Dg Chest 2 View  Result Date: 08/27/2017 CLINICAL DATA:  Chest pain starting at 8 this evening. EXAM: CHEST - 2 VIEW COMPARISON:  None.  FINDINGS: Cardiomegaly with moderate aortic atherosclerosis at the arch. Evidence of prior TAVR. No acute pulmonary consolidation or CHF. Bibasilar atelectasis is noted. Mild degenerative change along thoracic spine. IMPRESSION: Cardiomegaly with aortic atherosclerosis. TAVR. Bibasilar atelectasis. Electronically Signed   By: Ashley Royalty M.D.   On: 08/27/2017 23:18    Cardiac Studies:  ECG: Flutter RBBB no acute ST changes    Telemetry: Flutter rates 70's   Echo:   Medications:   . amLODipine  10 mg Oral Daily  . atorvastatin  20 mg Oral QPM  . fesoterodine  8 mg Oral QPM  . [START ON 08/30/2017] furosemide  40 mg Oral Daily  . isosorbide mononitrate  60 mg Oral Daily  . LORazepam  1 mg Oral QHS  . metoprolol succinate  25 mg Oral Daily  . mupirocin ointment  1 application Nasal BID  . ramipril  10 mg Oral BID  . sodium chloride flush  3 mL Intravenous Q12H     . sodium chloride    . sodium chloride 75 mL/hr at 08/29/17 0600  . heparin 950 Units/hr (08/29/17 0600)    Assessment/Plan:  Angina:  Minimal troponin elevation .08 ECG ok  Known 95-99% mid RCA stenosis calcified And unable to be crossed most recently on cath 2016.  INR 1.9 this was on heparin will Repeat at noon and hopefully do case this afternoon. Continue nitrates beta blocker and heparin Patient is post TAVR with 29 mm Sapien 3 valve Given age was thought that SVG to RCA and AVR Was less appropriate than TAVR and medical RX  Jenkins Rouge 08/29/2017, 8:22 AM

## 2017-08-29 NOTE — Progress Notes (Signed)
   Cath canceled for today as INR is too high. Dr. Martinique spoke with the patient. Plan for cath on Friday, second case. Orders placed.   Daune Perch, AGNP-C [facility] 08/29/2017  3:00 PM Pager: [phone]

## 2017-08-29 NOTE — Progress Notes (Signed)
PROGRESS NOTE  Brief Narrative: Tyler Williams is a 84 y.o. male with a history of CAD s/p multiple PCI, TAVR 2018 by Dr. Burt Knack, AAA s/p remote repair, chronic HFpEF, and permanent AFib/AFlutter on coumadin who presented to the ED after an episode of chest pain/pressure which started abruptly while sitting watching television. He took NTG (though later noted this expired in 2013), ativan, and ASA 325mg  without much improvement and ultimately called EMS. In the ED he had elevated BP, ECG showing AFib/flutter with controlle ventricular rate and RBBB which is chronic. CXR demonstrated cardiomegaly with bibasilar atelectasis. Troponin remained flat (0.08 >> 0.07). Vitamin K was given to reverse INR in preparation for repeat catheterization, though INR came to 1.76 and this was felt to be too high for femoral access. Heparin is started and plan is for Dr. Martinique to attempt to traverse RCA lesion on 7/19.   Subjective: No chest pain or dyspnea. Wants to go home to care for his wife, but his daughter is staying over. No bleeding, bruising. Denies orthopnea, PND.   Objective: BP (!) 153/67 (BP Location: Left Arm)   Pulse (!) 57   Temp 97.9 F (36.6 C) (Oral)   Resp 18   Ht 5\' 8"  (1.727 m)   Wt 80.1 kg (176 lb 9.6 oz)   SpO2 98%   BMI 26.85 kg/m   Gen: Pleasant elderly, WDWN male in no distress Pulm: Nonlabored breathing room air. Clear. CV: Irreg irreg. No murmur, rub, or gallop. No JVD, trace dependent edema. GI: Abdomen soft, non-tender, non-distended, with normoactive bowel sounds.  Ext: Warm, no deformities Skin: No rashes, lesions or ulcers on visualized skin.  Neuro: Alert and oriented. No focal neurological deficits. Psych: Judgement and insight appear fair. Mood euthymic & affect congruent. Behavior is appropriate.   Assessment & Plan: Chest pain with known CAD, 95-99% calcified RCA stenosis, RBBB: Continue cycling cardiac enzymes, repeat echocardiogram is pending. On imdur, beta  blocker, was given aspirin, on statin, LDL is 78.  - Cardiology feels Dr. Martinique would be able to open RCA lesion with currently available technology, though this is not possible until 7/19. - Continue metoprolol, ASA, statin, imdur  AFib: With pauses on telemetry up to 3.18sec while sleeping on review this morning.  - Defer dosing of metoprolol to cardiology.  - Started heparin while holding coumadin. Unclear what the target is for INR. Will trend and give vit K prn.   HTN:  - BP elevated, continue norvasc, imdur, ramipril.   Anxiety:  - Cotninue home ativan  Severe aortic stenosis s/p TAVR: Echo showed good position, trivial regurgitation.   Patrecia Pour, MD 08/29/2017, 4:25 PM

## 2017-08-29 NOTE — Progress Notes (Signed)
[place] for Heparin when INR <2 (warfarin on hold) Indication: atrial fibrillation, CP  No Known Allergies  Patient Measurements: Height: 5\' 8"  (172.7 cm) Weight: 176 lb 9.6 oz (80.1 kg) IBW/kg (Calculated) : 68.4 Heparin Dosing Weight:   Vital Signs: Temp: 97.7 F (36.5 C) (07/17 2144) Temp Source: Oral (07/17 2144) BP: 142/61 (07/17 2144) Pulse Rate: 58 (07/17 2144)  Labs: Recent Labs    08/27/17 2255  08/28/17 0350 08/28/17 1540 08/28/17 2112 08/29/17 0245 08/29/17 1149 08/29/17 2156  HGB 15.0  --  15.2  --   --   --  15.8  --   HCT 44.4  --  45.3  --   --   --  46.4  --   PLT 114*  --  121*  --   --   --  119*  --   LABPROT  --    < >  --   --  25.1* 22.4* 20.3*  --   INR  --    < >  --   --  2.30 1.99 1.76  --   HEPARINUNFRC  --   --   --   --   --   --  <0.10* 0.61  CREATININE 1.01  --   --   --   --   --   --   --   TROPONINI  --   --  0.08* 0.06* 0.07*  --   --   --    < > = values in this interval not displayed.    Estimated Creatinine Clearance: 48 mL/min (by C-G formula based on SCr of 1.01 mg/dL).   Medical History: Past Medical History:  Diagnosis Date  . A-fib ([place])   . Coronary artery disease   . Hypertension     Assessment: 84 yo M presents with CP. On Coumadin PTA for hx of Afib. INR therapeutic at 2.07. Now Cards plans to take to cath so holding Coumadin and will use heparin when INR < 2. Hgb wnl and plts low at 121.  7/17 PM update: heparin level therapeutic x 1 after rate increase  Goal of Therapy:  Heparin level 0.3-0.7 units/ml Monitor platelets by anticoagulation protocol: Yes   Plan:  Cont heparin at 1200 units/hr Confirmatory heparin level with AM labs  Narda Bonds 08/29/2017,11:41 PM

## 2017-08-29 NOTE — Progress Notes (Signed)
[place] for Heparin when INR <2 (warfarin on hold) Indication: atrial fibrillation, CP  No Known Allergies  Patient Measurements: Height: 5\' 8"  (172.7 cm) Weight: 177 lb 14.6 oz (80.7 kg) IBW/kg (Calculated) : 68.4 Heparin Dosing Weight:   Vital Signs: Temp: 97.9 F (36.6 C) (07/16 2348) Temp Source: Oral (07/16 2348) BP: 141/62 (07/16 2348) Pulse Rate: 48 (07/16 2348)  Labs: Recent Labs    08/27/17 2255 08/28/17 0305 08/28/17 0350 08/28/17 1540 08/28/17 2112 08/29/17 0245  HGB 15.0  --  15.2  --   --   --   HCT 44.4  --  45.3  --   --   --   PLT 114*  --  121*  --   --   --   LABPROT  --  23.1*  --   --  25.1* 22.4*  INR  --  2.07  --   --  2.30 1.99  CREATININE 1.01  --   --   --   --   --   TROPONINI  --   --  0.08* 0.06* 0.07*  --     Estimated Creatinine Clearance: 48 mL/min (by C-G formula based on SCr of 1.01 mg/dL).   Medical History: Past Medical History:  Diagnosis Date  . A-fib ([place])   . Coronary artery disease   . Hypertension     Assessment: 84 yo M presents with CP. On Coumadin PTA for hx of Afib. INR therapeutic at 2.07. Now Cards plans to take to cath so holding Coumadin and will use heparin when INR < 2. Hgb wnl and plts low at 121.  7/17 AM update: INR now <2, will start heparin   Goal of Therapy:  Heparin level 0.3-0.7 units/ml Monitor platelets by anticoagulation protocol: Yes   Plan:  Start heparin at 950 units/hr 1300 HL  Narda Bonds 08/29/2017,4:36 AM

## 2017-08-30 DIAGNOSIS — R072 Precordial pain: Secondary | ICD-10-CM | POA: Diagnosis not present

## 2017-08-30 DIAGNOSIS — J9811 Atelectasis: Secondary | ICD-10-CM | POA: Diagnosis not present

## 2017-08-30 DIAGNOSIS — I110 Hypertensive heart disease with heart failure: Secondary | ICD-10-CM | POA: Diagnosis not present

## 2017-08-30 DIAGNOSIS — I482 Chronic atrial fibrillation: Secondary | ICD-10-CM | POA: Diagnosis not present

## 2017-08-30 DIAGNOSIS — F419 Anxiety disorder, unspecified: Secondary | ICD-10-CM | POA: Diagnosis present

## 2017-08-30 DIAGNOSIS — I4892 Unspecified atrial flutter: Secondary | ICD-10-CM | POA: Diagnosis not present

## 2017-08-30 DIAGNOSIS — Z96652 Presence of left artificial knee joint: Secondary | ICD-10-CM | POA: Diagnosis not present

## 2017-08-30 DIAGNOSIS — I200 Unstable angina: Secondary | ICD-10-CM | POA: Diagnosis not present

## 2017-08-30 DIAGNOSIS — I350 Nonrheumatic aortic (valve) stenosis: Secondary | ICD-10-CM | POA: Diagnosis not present

## 2017-08-30 DIAGNOSIS — Z952 Presence of prosthetic heart valve: Secondary | ICD-10-CM | POA: Diagnosis not present

## 2017-08-30 DIAGNOSIS — E785 Hyperlipidemia, unspecified: Secondary | ICD-10-CM | POA: Diagnosis not present

## 2017-08-30 DIAGNOSIS — K5900 Constipation, unspecified: Secondary | ICD-10-CM | POA: Diagnosis present

## 2017-08-30 DIAGNOSIS — I25110 Atherosclerotic heart disease of native coronary artery with unstable angina pectoris: Secondary | ICD-10-CM | POA: Diagnosis not present

## 2017-08-30 DIAGNOSIS — Z7901 Long term (current) use of anticoagulants: Secondary | ICD-10-CM | POA: Diagnosis not present

## 2017-08-30 DIAGNOSIS — I4510 Unspecified right bundle-branch block: Secondary | ICD-10-CM | POA: Diagnosis not present

## 2017-08-30 DIAGNOSIS — I10 Essential (primary) hypertension: Secondary | ICD-10-CM | POA: Diagnosis not present

## 2017-08-30 DIAGNOSIS — Z7982 Long term (current) use of aspirin: Secondary | ICD-10-CM | POA: Diagnosis not present

## 2017-08-30 DIAGNOSIS — I2584 Coronary atherosclerosis due to calcified coronary lesion: Secondary | ICD-10-CM | POA: Diagnosis not present

## 2017-08-30 DIAGNOSIS — I5032 Chronic diastolic (congestive) heart failure: Secondary | ICD-10-CM | POA: Diagnosis not present

## 2017-08-30 DIAGNOSIS — I25119 Atherosclerotic heart disease of native coronary artery with unspecified angina pectoris: Secondary | ICD-10-CM | POA: Diagnosis not present

## 2017-08-30 DIAGNOSIS — Z79899 Other long term (current) drug therapy: Secondary | ICD-10-CM | POA: Diagnosis not present

## 2017-08-30 LAB — HEPARIN LEVEL (UNFRACTIONATED)
Heparin Unfractionated: 0.73 IU/mL — ABNORMAL HIGH (ref 0.30–0.70)
Heparin Unfractionated: 0.62 IU/mL (ref 0.30–0.70)
Heparin Unfractionated: 0.79 IU/mL — ABNORMAL HIGH (ref 0.30–0.70)

## 2017-08-30 LAB — CBC
HCT: 46.2 % (ref 39.0–52.0)
Hemoglobin: 15.6 g/dL (ref 13.0–17.0)
MCH: 32.8 pg (ref 26.0–34.0)
MCHC: 33.8 g/dL (ref 30.0–36.0)
MCV: 97.3 fL (ref 78.0–100.0)
Platelets: 120 10*3/uL — ABNORMAL LOW (ref 150–400)
RBC: 4.75 MIL/uL (ref 4.22–5.81)
RDW: 13.1 % (ref 11.5–15.5)
WBC: 6.5 10*3/uL (ref 4.0–10.5)

## 2017-08-30 LAB — PROTIME-INR
INR: 1.46
Prothrombin Time: 17.6 seconds — ABNORMAL HIGH (ref 11.4–15.2)

## 2017-08-30 MED ORDER — POLYETHYLENE GLYCOL 3350 17 G PO PACK
17.0000 g | PACK | Freq: Every day | ORAL | Status: DC
  Administered 2017-08-30: 17 g via ORAL
  Filled 2017-08-30 (×2): qty 1

## 2017-08-30 MED ORDER — BISACODYL 10 MG RE SUPP: 10.0000 mg | Freq: Every day | RECTAL | Status: DC | PRN

## 2017-08-30 MED ORDER — SENNOSIDES-DOCUSATE SODIUM 8.6-50 MG PO TABS
1.0000 | ORAL_TABLET | Freq: Two times a day (BID) | ORAL | Status: DC
  Administered 2017-08-30 – 2017-09-01 (×4): 1 via ORAL
  Filled 2017-08-30 (×4): qty 1

## 2017-08-30 NOTE — Progress Notes (Signed)
Subjective:  Denies SSCP, palpitations or Dyspnea Being very patient about delay in cath Appreciate Dr Martinique speaking with him   Objective:  Vitals:   08/29/17 0723 08/29/17 1614 08/29/17 2144 08/30/17 0834  BP: (!) 171/63 (!) 153/67 (!) 142/61 (!) 167/79  Pulse: (!) 56 (!) 57 (!) 58 79  Resp: 14 18 18 18   Temp: 98.5 F (36.9 C) 97.9 F (36.6 C) 97.7 F (36.5 C) 97.7 F (36.5 C)  TempSrc: Oral Oral Oral Oral  SpO2: 98% 98% 99% 100%  Weight:      Height:        Intake/Output from previous day:  Intake/Output Summary (Last 24 hours) at 08/30/2017 1609 Last data filed at 08/30/2017 0900 Gross per 24 hour  Intake 1510.47 ml  Output 1150 ml  Net 360.47 ml    Physical Exam: Affect appropriate Elderly white male  HEENT: upper dentures  Neck supple with no adenopathy JVP normal no bruits no thyromegaly Lungs clear with no wheezing and good diaphragmatic motion Heart:  S1/S2 SEM through TAVR no AR murmur, no rub, gallop or click PMI normal Abdomen: benighn, BS positve, no tenderness, no AAA no bruit.  No HSM or HJR Distal pulses intact with no bruits No edema Neuro non-focal Skin warm and dry No muscular weakness   Lab Results: Basic Metabolic Panel: Recent Labs    08/27/17 2255  NA 140  K 4.0  CL 108  CO2 24  GLUCOSE 156*  BUN 24*  CREATININE 1.01  CALCIUM 9.0   Liver Function Tests: No results for input(s): AST, ALT, ALKPHOS, BILITOT, PROT, ALBUMIN in the last 72 hours. No results for input(s): LIPASE, AMYLASE in the last 72 hours. CBC: Recent Labs    08/28/17 0350 08/29/17 1149 08/30/17 0430  WBC 5.8 6.7 6.5  NEUTROABS 3.8  --   --   HGB 15.2 15.8 15.6  HCT 45.3 46.4 46.2  MCV 98.1 96.9 97.3  PLT 121* 119* 120*   Cardiac Enzymes: Recent Labs    08/28/17 0350 08/28/17 1540 08/28/17 2112  TROPONINI 0.08* 0.06* 0.07*   BNP: Invalid input(s): POCBNP D-Dimer: No results for input(s): DDIMER in the last 72 hours. Hemoglobin A1C: No  results for input(s): HGBA1C in the last 72 hours. Fasting Lipid Panel: Recent Labs    08/28/17 0350  CHOL 149  HDL 52  LDLCALC 78  TRIG 95  CHOLHDL 2.9    Imaging: No results found.  Cardiac Studies:  ECG: Flutter RBBB nonspecific ST changes    Telemetry: flutter rates 60-70's   Echo: 08/28/17 EF 55-60% no RWMAls normal TAVR valve mean gradient not recorded   Medications:   . amLODipine  10 mg Oral Daily  . atorvastatin  20 mg Oral QPM  . fesoterodine  8 mg Oral QPM  . furosemide  40 mg Oral Daily  . isosorbide mononitrate  60 mg Oral Daily  . LORazepam  1 mg Oral QHS  . metoprolol succinate  25 mg Oral Daily  . mupirocin ointment  1 application Nasal BID  . polyethylene glycol  17 g Oral Daily  . ramipril  10 mg Oral BID  . sodium chloride flush  3 mL Intravenous Q12H     . sodium chloride    . heparin 1,100 Units/hr (08/30/17 0555)    Assessment/Plan:   1. SSCP:  Troponin .08 no acute ECG changes Known 95% tight calcified mid RCA that has been unable to Be crossed in past and elected  medical Rx prior to his TAVR procedure Cath to assess any new lesions and Possibly intervene on delayed by high INR To have cath in am with Dr Martinique from femoral approach  2. Flutter good rate control coumadin held on heparin INR 1.46   3. TAVR:  29 mm Sapien 3 valve normal function SBE   Jenkins Rouge 08/30/2017, 4:09 PM

## 2017-08-30 NOTE — Progress Notes (Signed)
[place] for Heparin when INR <2 (warfarin on hold) Indication: atrial fibrillation, CP  No Known Allergies  Patient Measurements: Height: 5\' 8"  (172.7 cm) Weight: 176 lb 9.6 oz (80.1 kg) IBW/kg (Calculated) : 68.4 Heparin Dosing Weight: 80 kg  Vital Signs: Temp: 97.7 F (36.5 C) (07/17 2144) Temp Source: Oral (07/17 2144) BP: 142/61 (07/17 2144) Pulse Rate: 58 (07/17 2144)  Labs: Recent Labs    08/27/17 2255  08/28/17 0350 08/28/17 1540 08/28/17 2112 08/29/17 0245 08/29/17 1149 08/29/17 2156 08/30/17 0430  HGB 15.0  --  15.2  --   --   --  15.8  --  15.6  HCT 44.4  --  45.3  --   --   --  46.4  --  46.2  PLT 114*  --  121*  --   --   --  119*  --  120*  LABPROT  --    < >  --   --  25.1* 22.4* 20.3*  --  17.6*  INR  --    < >  --   --  2.30 1.99 1.76  --  1.46  HEPARINUNFRC  --   --   --   --   --   --  <0.10* 0.61 0.73*  CREATININE 1.01  --   --   --   --   --   --   --   --   TROPONINI  --   --  0.08* 0.06* 0.07*  --   --   --   --    < > = values in this interval not displayed.    Estimated Creatinine Clearance: 48 mL/min (by C-G formula based on SCr of 1.01 mg/dL).   Medical History: Past Medical History:  Diagnosis Date  . A-fib ([place])   . Coronary artery disease   . Hypertension     Assessment: 84 yo M presents with CP. On Coumadin 5mg  daily exc for 7.5mg  on Mon PTA for hx of Afib. INR therapeutic at 2.07 on admit. Now Cards plans to take to cath so holding Coumadin, Heparin started when INR<2. Pharmacy consulted to dose.    Heparin level this afternoon is therapeutic after a rate decrease earlier today (HL 0.62 << 0.72, goal of 0.3-0.7). Hgb/Hct wnl, plts 120.  Goal of Therapy:  Heparin level 0.3-0.7 units/ml Monitor platelets by anticoagulation protocol: Yes   Plan:  - Continue Heparin at 1100 units/hr (11 ml/hr) - Will continue to monitor for any signs/symptoms of bleeding and will follow up with heparin  level in 8 hours to confirm therapeutic  Thank you for allowing pharmacy to be a part of this patient's care.  Alycia Rossetti, PharmD, BCPS Clinical Pharmacist Pager: [phone] Clinical phone for 08/30/2017 from 7a-3:30p: [phone] If after 3:30p, please call main pharmacy at: [phone] Please check AMION for all [place] numbers 08/30/2017 2:19 PM

## 2017-08-30 NOTE — Telephone Encounter (Signed)
Thx. Must be in another health system. I can't find any information in Epic

## 2017-08-30 NOTE — Progress Notes (Signed)
ANTICOAGULATION CONSULT NOTE - Follow Up Consult  Pharmacy Consult for heparin Indication: Afib and ACS  Labs: Recent Labs    08/27/17 2255  08/28/17 0350 08/28/17 1540 08/28/17 2112 08/29/17 0245 08/29/17 1149 08/29/17 2156 08/30/17 0430  HGB 15.0  --  15.2  --   --   --  15.8  --   --   HCT 44.4  --  45.3  --   --   --  46.4  --   --   PLT 114*  --  121*  --   --   --  119*  --   --   LABPROT  --    < >  --   --  25.1* 22.4* 20.3*  --  17.6*  INR  --    < >  --   --  2.30 1.99 1.76  --  1.46  HEPARINUNFRC  --   --   --   --   --   --  <0.10* 0.61 0.73*  CREATININE 1.01  --   --   --   --   --   --   --   --   TROPONINI  --   --  0.08* 0.06* 0.07*  --   --   --   --    < > = values in this interval not displayed.    Assessment: 84yo male now supratherapeutic on heparin after one level at goal; no gtt issues or signs of bleeding per RN.  Goal of Therapy:  Heparin level 0.3-0.7 units/ml   Plan:  Will decrease heparin gtt by 1-2 units/kg/hr to 1100 units/hr and check level in 8 hours.    Wynona Neat, PharmD, BCPS  08/30/2017,5:31 AM

## 2017-08-30 NOTE — Progress Notes (Addendum)
PROGRESS NOTE  Brief Narrative: Tyler Williams is a 84 y.o. male with a history of CAD s/p multiple PCI, TAVR 2018 by Dr. Burt Knack, AAA s/p remote repair, chronic HFpEF, and permanent AFib/AFlutter on coumadin who presented to the ED after an episode of chest pain/pressure which started abruptly while sitting watching television. He took NTG (though later noted this expired in 2013), ativan, and ASA 325mg  without much improvement and ultimately called EMS. In the ED he had elevated BP, ECG showing AFib/flutter with controlle ventricular rate and RBBB which is chronic. CXR demonstrated cardiomegaly with bibasilar atelectasis. Troponin remained flat (0.08 >> 0.07). Vitamin K was given to reverse INR in preparation for repeat catheterization, though INR came to 1.76 and this was felt to be too high for femoral access. Heparin is started and plan is for Dr. Martinique to attempt to traverse RCA lesion on 7/19.   Subjective: Denies any new problems. No chest pain or dyspnea. Wife is coming to see him around 1pm.  Objective: BP (!) 142/61 (BP Location: Left Arm)   Pulse (!) 58   Temp 97.7 F (36.5 C) (Oral)   Resp 18   Ht 5\' 8"  (1.727 m)   Wt 80.1 kg (176 lb 9.6 oz)   SpO2 99%   BMI 26.85 kg/m   Gen: Pleasant, elderly male in no distress Pulm: Nonlabored breathing room air. Clear. CV: Irreg, rate in 60's. No murmur, no rub, or gallop. No JVD, trace dependent edema. GI: Abdomen soft, non-tender, non-distended, with normoactive bowel sounds.  Ext: Warm, no deformities Skin: Scattered ecchymoses, otherwise no rashes, lesions or ulcers on visualized skin.  Neuro: Alert and oriented. No focal neurological deficits. Psych: Judgement and insight appear fair. Mood euthymic & affect congruent. Behavior is appropriate.    Echo 7/16 - Left ventricle: The cavity size was normal. Wall thickness was   increased in a pattern of mild LVH. Systolic function was normal.   The estimated ejection fraction was in the  range of 55% to 60%.   There is akinesis of the basalinferolateral myocardium. - Aortic valve: A bioprosthesis was present. There was trivial   regurgitation. - Mitral valve: Calcified annulus. There was mild regurgitation. - Left atrium: The atrium was severely dilated. - Right atrium: The atrium was moderately dilated. - Pulmonary arteries: Systolic pressure was mildly increased. PA   peak pressure: 43 mm Hg (S).  Impressions: - Akinesis of the inferolateral wall with overall preserved LV   systolic function; s/p AVR with trace AI; mild MR; biatrial   enlargement; trace TR with mild pulmonary hypertension.  Assessment & Plan: Chest pain with known CAD, 95-99% calcified RCA stenosis, RBBB: Tn plateau 0.08 > 0.07.  - Cardiology feels Dr. Martinique would be able to open RCA lesion with currently available technology, though this is not possible until 7/19. - Continue metoprolol, ASA, statin (LDL is 78), imdur  AFib: With pauses on telemetry. Max was 3.18sec 7/16, max over past 24hrs was 2.33sec on tele review this AM. - No pauses approaching 5 seconds, so plan to continue metoprolol but defer to cardiology. - Started heparin while holding coumadin. Unclear what the target is for INR, per cardiology. Will check daily and give vit K prn.   HTN: BP improving.  - Continue norvasc, imdur, ramipril, metoprolol   Anxiety:  - Cotninue home ativan  Severe aortic stenosis s/p TAVR: Echo showed good position, trivial regurgitation.   Constipation: Acute, mild - Miralax daily  Patrecia Pour, MD  08/30/2017, 7:56 AM

## 2017-08-31 ENCOUNTER — Encounter (HOSPITAL_COMMUNITY): Payer: Self-pay | Admitting: Cardiology

## 2017-08-31 ENCOUNTER — Inpatient Hospital Stay (HOSPITAL_COMMUNITY)
Admission: EM | Disposition: A | Discharge status: Home / Self Care | Payer: Self-pay | Source: Home / Self Care | Attending: Family Medicine

## 2017-08-31 DIAGNOSIS — I200 Unstable angina: Secondary | ICD-10-CM

## 2017-08-31 DIAGNOSIS — I25110 Atherosclerotic heart disease of native coronary artery with unstable angina pectoris: Principal | ICD-10-CM

## 2017-08-31 HISTORY — PX: LEFT HEART CATH AND CORONARY ANGIOGRAPHY: CATH118249

## 2017-08-31 HISTORY — PX: CORONARY BALLOON ANGIOPLASTY: CATH118233

## 2017-08-31 LAB — POCT ACTIVATED CLOTTING TIME
Activated Clotting Time: 532 seconds
Activated Clotting Time: 268 seconds
Activated Clotting Time: 351 seconds
Activated Clotting Time: 252 seconds
Activated Clotting Time: 296 seconds
Activated Clotting Time: 345 seconds
Activated Clotting Time: 191 seconds
Activated Clotting Time: 197 seconds
Activated Clotting Time: 169 seconds

## 2017-08-31 LAB — BASIC METABOLIC PANEL
Anion gap: 9 (ref 5–15)
BUN: 15 mg/dL (ref 8–23)
CO2: 23 mmol/L (ref 22–32)
Calcium: 9 mg/dL (ref 8.9–10.3)
Chloride: 107 mmol/L (ref 98–111)
Creatinine, Ser: 0.77 mg/dL (ref 0.61–1.24)
GFR calc Af Amer: >60 mL/min (ref >60)
GFR calc non Af Amer: >60 mL/min (ref >60)
Glucose, Bld: 86 mg/dL (ref 70–99)
Potassium: 3.9 mmol/L (ref 3.5–5.1)
Sodium: 139 mmol/L (ref 135–145)

## 2017-08-31 LAB — PROTIME-INR
INR: 1.21
Prothrombin Time: 15.2 seconds (ref 11.4–15.2)

## 2017-08-31 LAB — CBC
HCT: 46.9 % (ref 39.0–52.0)
Hemoglobin: 16.1 g/dL (ref 13.0–17.0)
MCH: 33 pg (ref 26.0–34.0)
MCHC: 34.3 g/dL (ref 30.0–36.0)
MCV: 96.1 fL (ref 78.0–100.0)
Platelets: 123 10*3/uL — ABNORMAL LOW (ref 150–400)
RBC: 4.88 MIL/uL (ref 4.22–5.81)
RDW: 13.2 % (ref 11.5–15.5)
WBC: 6.1 10*3/uL (ref 4.0–10.5)

## 2017-08-31 LAB — HEPARIN LEVEL (UNFRACTIONATED): Heparin Unfractionated: 0.38 IU/mL (ref 0.30–0.70)

## 2017-08-31 SURGERY — LEFT HEART CATH AND CORONARY ANGIOGRAPHY
Anesthesia: LOCAL

## 2017-08-31 MED ORDER — ASPIRIN 81 MG PO CHEW
81.0000 mg | CHEWABLE_TABLET | ORAL | Status: AC
  Administered 2017-08-31: 81 mg via ORAL
  Filled 2017-08-31 (×2): qty 1

## 2017-08-31 MED ORDER — SODIUM CHLORIDE 0.9 % WEIGHT BASED INFUSION: 1.0000 mL/kg/h | INTRAVENOUS | Status: DC

## 2017-08-31 MED ORDER — FENTANYL CITRATE (PF) 100 MCG/2ML IJ SOLN
INTRAMUSCULAR | Status: DC | PRN
  Administered 2017-08-31: 25 ug via INTRAVENOUS
  Administered 2017-08-31: 50 ug via INTRAVENOUS
  Administered 2017-08-31: 25 ug via INTRAVENOUS

## 2017-08-31 MED ORDER — MIDAZOLAM HCL 2 MG/2ML IJ SOLN
INTRAMUSCULAR | Status: AC
  Filled 2017-08-31: qty 2

## 2017-08-31 MED ORDER — FAMOTIDINE IN NACL 20-0.9 MG/50ML-% IV SOLN
INTRAVENOUS | Status: AC
  Filled 2017-08-31: qty 50

## 2017-08-31 MED ORDER — HEPARIN (PORCINE) IN NACL 1000-0.9 UT/500ML-% IV SOLN
INTRAVENOUS | Status: AC
  Filled 2017-08-31: qty 1000

## 2017-08-31 MED ORDER — CLOPIDOGREL BISULFATE 300 MG PO TABS
ORAL_TABLET | ORAL | Status: DC | PRN
  Administered 2017-08-31: 600 mg via ORAL

## 2017-08-31 MED ORDER — LIDOCAINE HCL (PF) 1 % IJ SOLN
INTRAMUSCULAR | Status: DC | PRN
  Administered 2017-08-31: 7 mL via INTRADERMAL
  Administered 2017-08-31: 10 mL via INTRADERMAL

## 2017-08-31 MED ORDER — HEPARIN SODIUM (PORCINE) 1000 UNIT/ML IJ SOLN
INTRAMUSCULAR | Status: AC
  Filled 2017-08-31: qty 1

## 2017-08-31 MED ORDER — SODIUM CHLORIDE 0.9 % WEIGHT BASED INFUSION
3.0000 mL/kg/h | INTRAVENOUS | Status: DC
  Administered 2017-08-31: 3 mL/kg/h via INTRAVENOUS

## 2017-08-31 MED ORDER — SODIUM CHLORIDE 0.9% FLUSH: 3.0000 mL | Freq: Two times a day (BID) | INTRAVENOUS | Status: DC

## 2017-08-31 MED ORDER — HEPARIN SODIUM (PORCINE) 1000 UNIT/ML IJ SOLN
INTRAMUSCULAR | Status: DC | PRN
  Administered 2017-08-31: 2000 [IU] via INTRAVENOUS
  Administered 2017-08-31: 8000 [IU] via INTRAVENOUS
  Administered 2017-08-31: 3000 [IU] via INTRAVENOUS

## 2017-08-31 MED ORDER — MIDAZOLAM HCL 2 MG/2ML IJ SOLN
INTRAMUSCULAR | Status: DC | PRN
  Administered 2017-08-31 (×2): 1 mg via INTRAVENOUS

## 2017-08-31 MED ORDER — SODIUM CHLORIDE 0.9% FLUSH: 3.0000 mL | INTRAVENOUS | Status: DC | PRN

## 2017-08-31 MED ORDER — ASPIRIN 81 MG PO CHEW
81.0000 mg | CHEWABLE_TABLET | Freq: Every day | ORAL | Status: DC
  Administered 2017-09-01: 81 mg via ORAL
  Filled 2017-08-31: qty 1

## 2017-08-31 MED ORDER — HYDRALAZINE HCL 20 MG/ML IJ SOLN
10.0000 mg | INTRAMUSCULAR | Status: DC | PRN
  Administered 2017-08-31 (×2): 10 mg via INTRAVENOUS
  Filled 2017-08-31 (×2): qty 1

## 2017-08-31 MED ORDER — SODIUM CHLORIDE 0.9 % IV SOLN: 250.0000 mL | INTRAVENOUS | Status: DC | PRN

## 2017-08-31 MED ORDER — NITROGLYCERIN 1 MG/10 ML FOR IR/CATH LAB
INTRA_ARTERIAL | Status: AC
  Filled 2017-08-31: qty 10

## 2017-08-31 MED ORDER — TIROFIBAN (AGGRASTAT) BOLUS VIA INFUSION
INTRAVENOUS | Status: DC | PRN
  Administered 2017-08-31: 2040 ug via INTRAVENOUS

## 2017-08-31 MED ORDER — ANGIOPLASTY BOOK
Freq: Once | Status: AC
  Administered 2017-09-01: 01:00:00 1
  Filled 2017-08-31: qty 1

## 2017-08-31 MED ORDER — HEPARIN (PORCINE) IN NACL 1000-0.9 UT/500ML-% IV SOLN
INTRAVENOUS | Status: DC | PRN
  Administered 2017-08-31 (×4): 500 mL

## 2017-08-31 MED ORDER — IOHEXOL 350 MG/ML SOLN
INTRAVENOUS | Status: DC | PRN
  Administered 2017-08-31: 135 mL via INTRA_ARTERIAL

## 2017-08-31 MED ORDER — FENTANYL CITRATE (PF) 100 MCG/2ML IJ SOLN
INTRAMUSCULAR | Status: AC
  Filled 2017-08-31: qty 2

## 2017-08-31 MED ORDER — SODIUM CHLORIDE 0.9 % WEIGHT BASED INFUSION
1.0000 mL/kg/h | INTRAVENOUS | Status: DC
  Administered 2017-08-31: 1 mL/kg/h via INTRAVENOUS

## 2017-08-31 MED ORDER — LIDOCAINE HCL (PF) 1 % IJ SOLN
INTRAMUSCULAR | Status: AC
  Filled 2017-08-31: qty 30

## 2017-08-31 MED ORDER — CLOPIDOGREL BISULFATE 300 MG PO TABS
ORAL_TABLET | ORAL | Status: AC
  Filled 2017-08-31: qty 2

## 2017-08-31 MED ORDER — SODIUM CHLORIDE 0.9 % WEIGHT BASED INFUSION
1.0000 mL/kg/h | INTRAVENOUS | Status: AC
  Administered 2017-08-31: 14:00:00 1 mL/kg/h via INTRAVENOUS

## 2017-08-31 MED ORDER — FAMOTIDINE IN NACL 20-0.9 MG/50ML-% IV SOLN
INTRAVENOUS | Status: AC | PRN
  Administered 2017-08-31: 20 mg via INTRAVENOUS

## 2017-08-31 MED ORDER — SODIUM CHLORIDE 0.9 % WEIGHT BASED INFUSION: 3.0000 mL/kg/h | INTRAVENOUS | Status: DC

## 2017-08-31 SURGICAL SUPPLY — 37 items
BALLN SAPPHIRE 1.5X12 (BALLOONS) ×2
BALLN SAPPHIRE 2.0X12 (BALLOONS) ×2
BALLN SAPPHIRE 2.5X12 (BALLOONS) ×2
BALLOON SAPPHIRE 1.5X12 (BALLOONS) IMPLANT
BALLOON SAPPHIRE 2.0X12 (BALLOONS) IMPLANT
BALLOON SAPPHIRE 2.5X12 (BALLOONS) IMPLANT
CABLE ADAPT CONN TEMP 6FT (ADAPTER) ×1 IMPLANT
CATH INFINITI MULTIPACK ST 5F (CATHETERS) ×1 IMPLANT
CATH LAUNCHER 6FR AL.75 (CATHETERS) ×1 IMPLANT
CATH LAUNCHER 6FR AR1 (CATHETERS) ×1 IMPLANT
CATH S G BIP PACING (SET/KITS/TRAYS/PACK) ×1 IMPLANT
CATH TELEPORT (CATHETERS) ×1 IMPLANT
COVER PRB 48X5XTLSCP FOLD TPE (BAG) IMPLANT
COVER PROBE 5X48 (BAG) ×2
ELECT DEFIB PAD ADLT CADENCE (PAD) ×1 IMPLANT
GUIDELINER 6F (CATHETERS) ×1 IMPLANT
HOVERMATT SINGLE USE (MISCELLANEOUS) ×1 IMPLANT
KIT ENCORE 26 ADVANTAGE (KITS) ×1 IMPLANT
KIT HEART LEFT (KITS) ×2 IMPLANT
KIT HEMO VALVE WATCHDOG (MISCELLANEOUS) ×1 IMPLANT
KIT MICROPUNCTURE NIT STIFF (SHEATH) ×1 IMPLANT
PACK CARDIAC CATHETERIZATION (CUSTOM PROCEDURE TRAY) ×2 IMPLANT
SHEATH PINNACLE 5F 10CM (SHEATH) ×1 IMPLANT
SHEATH PINNACLE 6F 10CM (SHEATH) ×2 IMPLANT
SYR MEDRAD MARK V 150ML (SYRINGE) ×2 IMPLANT
TRANSDUCER W/STOPCOCK (MISCELLANEOUS) ×2 IMPLANT
TUBING CIL FLEX 10 FLL-RA (TUBING) ×2 IMPLANT
WIRE ASAHI PROWATER 180CM (WIRE) ×1 IMPLANT
WIRE ASAHI PROWATER 300CM (WIRE) ×1 IMPLANT
WIRE EMERALD 3MM-J .035X150CM (WIRE) ×1 IMPLANT
WIRE EMERALD 3MM-J .035X260CM (WIRE) ×1 IMPLANT
WIRE FIGHTER CROSSING 190CM (WIRE) ×1 IMPLANT
WIRE HI TORQ PILOT-200 300CM (WIRE) ×1 IMPLANT
WIRE HI TORQ VERSACORE-J 145CM (WIRE) ×1 IMPLANT
WIRE HI TORQ WHISPER MS 190CM (WIRE) ×1 IMPLANT
WIRE HI TORQ WHISPER MS 300CM (WIRE) ×1 IMPLANT
WIRE VIPER ADVANCE COR .012TIP (WIRE) ×2 IMPLANT

## 2017-08-31 NOTE — Progress Notes (Signed)
Patient ID: Tyler Williams, male   DOB: December 27, 1926, 84 y.o.   MRN: [id number] PROGRESS NOTE  Brief Narrative: Tyler Williams is a 84 y.o. male with a history of CAD s/p multiple PCI, TAVR 2018 by Dr. Burt Knack, AAA s/p remote repair, chronic HFpEF, and permanent AFib/AFlutter on coumadin who presented to the ED after an episode of chest pain/pressure which started abruptly while sitting watching television. He took NTG (though later noted this expired in 2013), ativan, and ASA 325mg  without much improvement and ultimately called EMS. In the ED he had elevated BP, ECG showing AFib/flutter with controlle ventricular rate and RBBB which is chronic. CXR demonstrated cardiomegaly with bibasilar atelectasis. Troponin remained flat (0.08 >> 0.07). Vitamin K was given to reverse INR in preparation for repeat catheterization, though INR came to 1.76 and this was felt to be too high for femoral access. Heparin is started and plan is for Dr. Martinique to attempt to traverse RCA lesion on 7/19.   Subjective: Patient going for heart catheterization today.  He denies any chest pain and has no complaints.  Objective: BP (!) 153/51 (BP Location: Right Arm)   Pulse 60   Temp 97.7 F (36.5 C) (Oral)   Resp 14   Ht 5\' 8"  (1.727 m)   Wt 81.6 kg (179 lb 14.3 oz)   SpO2 97%   BMI 27.35 kg/m   Gen: Pleasant, elderly male in no distress Pulm: Nonlabored breathing room air. Clear. CV: Irreg, rate in 60's. No murmur, no rub, or gallop. No JVD, trace dependent edema. GI: Abdomen soft, non-tender, non-distended, with normoactive bowel sounds.  Ext: Warm, no deformities Skin: Scattered ecchymoses, otherwise no rashes, lesions or ulcers on visualized skin.  Neuro: Alert and oriented. No focal neurological deficits. Psych: Judgement and insight appear fair. Mood euthymic & affect congruent. Behavior is appropriate.    Echo 7/16 - Left ventricle: The cavity size was normal. Wall thickness was   increased in a pattern of mild  LVH. Systolic function was normal.   The estimated ejection fraction was in the range of 55% to 60%.   There is akinesis of the basalinferolateral myocardium. - Aortic valve: A bioprosthesis was present. There was trivial   regurgitation. - Mitral valve: Calcified annulus. There was mild regurgitation. - Left atrium: The atrium was severely dilated. - Right atrium: The atrium was moderately dilated. - Pulmonary arteries: Systolic pressure was mildly increased. PA   peak pressure: 43 mm Hg (S).  Impressions: - Akinesis of the inferolateral wall with overall preserved LV   systolic function; s/p AVR with trace AI; mild MR; biatrial   enlargement; trace TR with mild pulmonary hypertension.  Assessment & Plan: Chest pain with known CAD, 95-99% calcified RCA stenosis, RBBB: Tn plateau 0.08 > 0.07.  - Cardiology feels Dr. Martinique would be able to open RCA lesion with currently available technology, for cardiac catheterization today - Continue metoprolol, ASA, statin (LDL is 78), imdur -Disposition pending results of cardiac catheterization and cardiology  recommendations  AFib: With pauses on telemetry. Max was 3.18sec 7/16, max over past 24hrs was 2.33sec  - No pauses approaching 5 seconds, so plan to continue metoprolol but defer to cardiology. - Started heparin while holding coumadin. Unclear what the target is for INR, per cardiology. Will check daily and give vit K prn.   HTN: BP improving.  - Continue norvasc, imdur, ramipril, metoprolol   Anxiety:  - Cotninue home ativan  Severe aortic stenosis s/p TAVR: Echo showed good  position, trivial regurgitation.   Constipation: Acute, mild - Miralax daily  Emmalynn Pinkham A, MD 08/31/2017, 1:52 PM

## 2017-08-31 NOTE — Progress Notes (Signed)
[facility] for Heparin when INR <2 (warfarin on hold) Indication: atrial fibrillation, CP  No Known Allergies  Patient Measurements: Height: 5\' 8"  (172.7 cm) Weight: 176 lb 9.6 oz (80.1 kg) IBW/kg (Calculated) : 68.4 Heparin Dosing Weight:   Vital Signs: Temp: 98 F (36.7 C) (07/18 2337) Temp Source: Oral (07/18 2337) BP: 121/57 (07/19 0120) Pulse Rate: 52 (07/18 2337)  Labs: Recent Labs    08/28/17 1540 08/28/17 2112  08/29/17 1149  08/30/17 0430 08/30/17 1319 08/30/17 2150 08/31/17 0530  HGB  --   --    < > 15.8  --  15.6  --   --  16.1  HCT  --   --   --  46.4  --  46.2  --   --  46.9  PLT  --   --   --  119*  --  120*  --   --  123*  LABPROT  --  25.1*   < > 20.3*  --  17.6*  --   --  15.2  INR  --  2.30   < > 1.76  --  1.46  --   --  1.21  HEPARINUNFRC  --   --   --  <0.10*   < > 0.73* 0.62 0.79* 0.38  CREATININE  --   --   --   --   --   --   --   --  0.77  TROPONINI 0.06* 0.07*  --   --   --   --   --   --   --    < > = values in this interval not displayed.    Estimated Creatinine Clearance: 60.6 mL/min (by C-G formula based on SCr of 0.77 mg/dL).   Medical History: Past Medical History:  Diagnosis Date  . A-fib ([place])   . Coronary artery disease   . Hypertension     Assessment: 84 yo M presents with CP. On Coumadin PTA for hx of Afib. INR therapeutic at 2.07. Now Cards plans to take to cath so holding Coumadin and will use heparin when INR < 2. Hgb wnl and plts low at 121.  7/19 AM update: heparin level therapeutic x 1 after rate decrease  Goal of Therapy:  Heparin level 0.3-0.7 units/ml Monitor platelets by anticoagulation protocol: Yes   Plan:  Cont heparin at 950 units/hr Confirmatory heparin level at 1200  Narda Bonds 08/31/2017,6:33 AM

## 2017-08-31 NOTE — Progress Notes (Signed)
Subjective:  No chest pain for cath today with Dr Martinique   Objective:  Vitals:   08/30/17 2337 08/31/17 0120 08/31/17 0642 08/31/17 0734  BP: 140/61 (!) 121/57  (!) 158/60  Pulse: (!) 52   60  Resp: 20   17  Temp: 98 F (36.7 C)   98.7 F (37.1 C)  TempSrc: Oral   Oral  SpO2: 95% 96%  95%  Weight:   179 lb 14.3 oz (81.6 kg)   Height:        Intake/Output from previous day:  Intake/Output Summary (Last 24 hours) at 08/31/2017 0819 Last data filed at 08/30/2017 2100 Gross per 24 hour  Intake 240 ml  Output 800 ml  Net -560 ml    Physical Exam: Affect appropriate Elderly white male  HEENT: upper dentures  Neck supple with no adenopathy JVP normal no bruits no thyromegaly Lungs clear with no wheezing and good diaphragmatic motion Heart:  S1/S2 SEM through TAVR no AR murmur, no rub, gallop or click PMI normal Abdomen: benighn, BS positve, no tenderness, no AAA no bruit.  No HSM or HJR Distal pulses intact with no bruits No edema Neuro non-focal Skin warm and dry No muscular weakness   Lab Results: Basic Metabolic Panel: Recent Labs    08/31/17 0530  NA 139  K 3.9  CL 107  CO2 23  GLUCOSE 86  BUN 15  CREATININE 0.77  CALCIUM 9.0   Liver Function Tests: No results for input(s): AST, ALT, ALKPHOS, BILITOT, PROT, ALBUMIN in the last 72 hours. No results for input(s): LIPASE, AMYLASE in the last 72 hours. CBC: Recent Labs    08/30/17 0430 08/31/17 0530  WBC 6.5 6.1  HGB 15.6 16.1  HCT 46.2 46.9  MCV 97.3 96.1  PLT 120* 123*   Cardiac Enzymes: Recent Labs    08/28/17 1540 08/28/17 2112  TROPONINI 0.06* 0.07*    Imaging: No results found.  Cardiac Studies:  ECG: Flutter RBBB nonspecific ST changes    Telemetry: flutter rates 60-70's   Echo: 08/28/17 EF 55-60% no RWMAls normal TAVR valve mean gradient not recorded   Medications:   . amLODipine  10 mg Oral Daily  . atorvastatin  20 mg Oral QPM  . fesoterodine  8 mg Oral QPM  .  furosemide  40 mg Oral Daily  . isosorbide mononitrate  60 mg Oral Daily  . LORazepam  1 mg Oral QHS  . metoprolol succinate  25 mg Oral Daily  . polyethylene glycol  17 g Oral Daily  . ramipril  10 mg Oral BID  . senna-docusate  1 tablet Oral BID  . sodium chloride flush  3 mL Intravenous Q12H  . sodium chloride flush  3 mL Intravenous Q12H     . sodium chloride    . sodium chloride    . sodium chloride 1 mL/kg/hr (08/31/17 0550)  . heparin 950 Units/hr (08/31/17 0124)    Assessment/Plan:   1. SSCP:  Troponin .08 no acute ECG changes Known 95% tight calcified mid RCA that has been unable to Be crossed in past and elected medical Rx prior to his TAVR procedure Cath to assess any new lesions and Possibly intervene on Will have to balance risk of aggressive RCA intervention attempt with previous thoughts To do TAVR and Rx CAD medically as patient wants to avoid open surgery given advanced age   28. Flutter good rate control coumadin held on heparin INR 1.2  3. TAVR:  29 mm Sapien 3 valve normal function SBE   Jenkins Rouge 08/31/2017, 8:19 AM

## 2017-08-31 NOTE — Interval H&P Note (Signed)
History and Physical Interval Note:  08/31/2017 9:16 AM  Tyler Williams  has presented today for surgery, with the diagnosis of ua  The various methods of treatment have been discussed with the patient and family. After consideration of risks, benefits and other options for treatment, the patient has consented to  Procedure(s): LEFT HEART CATH AND CORONARY ANGIOGRAPHY (N/A) as a surgical intervention .  The patient's history has been reviewed, patient examined, no change in status, stable for surgery.  I have reviewed the patient's chart and labs.  Questions were answered to the patient's satisfaction.   Cath Lab Visit (complete for each Cath Lab visit)  Clinical Evaluation Leading to the Procedure:   ACS: Yes.    Non-ACS:    Anginal Classification: CCS IV  Anti-ischemic medical therapy: Maximal Therapy (2 or more classes of medications)  Non-Invasive Test Results: No non-invasive testing performed  Prior CABG: No previous CABG        Tyler Williams [facility] 08/31/2017 9:16 AM

## 2017-08-31 NOTE — Progress Notes (Signed)
Site area: right groin  Site Prior to Removal:  Level 0  Pressure Applied For 18 MINUTES    Minutes Beginning at 19:57  Manual:   Yes.    Patient Status During Pull:  WNL  Post Pull Groin Site:  Level 0  Post Pull Instructions Given:  Yes.    Post Pull Pulses Present:  Yes.    Dressing Applied:  Yes.    Comments:  Pt tolerated well. V/S stable.

## 2017-08-31 NOTE — H&P (View-Only) (Signed)
Subjective:  No chest pain for cath today with Dr Martinique   Objective:  Vitals:   08/30/17 2337 08/31/17 0120 08/31/17 0642 08/31/17 0734  BP: 140/61 (!) 121/57  (!) 158/60  Pulse: (!) 52   60  Resp: 20   17  Temp: 98 F (36.7 C)   98.7 F (37.1 C)  TempSrc: Oral   Oral  SpO2: 95% 96%  95%  Weight:   179 lb 14.3 oz (81.6 kg)   Height:        Intake/Output from previous day:  Intake/Output Summary (Last 24 hours) at 08/31/2017 0819 Last data filed at 08/30/2017 2100 Gross per 24 hour  Intake 240 ml  Output 800 ml  Net -560 ml    Physical Exam: Affect appropriate Elderly white male  HEENT: upper dentures  Neck supple with no adenopathy JVP normal no bruits no thyromegaly Lungs clear with no wheezing and good diaphragmatic motion Heart:  S1/S2 SEM through TAVR no AR murmur, no rub, gallop or click PMI normal Abdomen: benighn, BS positve, no tenderness, no AAA no bruit.  No HSM or HJR Distal pulses intact with no bruits No edema Neuro non-focal Skin warm and dry No muscular weakness   Lab Results: Basic Metabolic Panel: Recent Labs    08/31/17 0530  NA 139  K 3.9  CL 107  CO2 23  GLUCOSE 86  BUN 15  CREATININE 0.77  CALCIUM 9.0   Liver Function Tests: No results for input(s): AST, ALT, ALKPHOS, BILITOT, PROT, ALBUMIN in the last 72 hours. No results for input(s): LIPASE, AMYLASE in the last 72 hours. CBC: Recent Labs    08/30/17 0430 08/31/17 0530  WBC 6.5 6.1  HGB 15.6 16.1  HCT 46.2 46.9  MCV 97.3 96.1  PLT 120* 123*   Cardiac Enzymes: Recent Labs    08/28/17 1540 08/28/17 2112  TROPONINI 0.06* 0.07*    Imaging: No results found.  Cardiac Studies:  ECG: Flutter RBBB nonspecific ST changes    Telemetry: flutter rates 60-70's   Echo: 08/28/17 EF 55-60% no RWMAls normal TAVR valve mean gradient not recorded   Medications:   . amLODipine  10 mg Oral Daily  . atorvastatin  20 mg Oral QPM  . fesoterodine  8 mg Oral QPM  .  furosemide  40 mg Oral Daily  . isosorbide mononitrate  60 mg Oral Daily  . LORazepam  1 mg Oral QHS  . metoprolol succinate  25 mg Oral Daily  . polyethylene glycol  17 g Oral Daily  . ramipril  10 mg Oral BID  . senna-docusate  1 tablet Oral BID  . sodium chloride flush  3 mL Intravenous Q12H  . sodium chloride flush  3 mL Intravenous Q12H     . sodium chloride    . sodium chloride    . sodium chloride 1 mL/kg/hr (08/31/17 0550)  . heparin 950 Units/hr (08/31/17 0124)    Assessment/Plan:   1. SSCP:  Troponin .08 no acute ECG changes Known 95% tight calcified mid RCA that has been unable to Be crossed in past and elected medical Rx prior to his TAVR procedure Cath to assess any new lesions and Possibly intervene on Will have to balance risk of aggressive RCA intervention attempt with previous thoughts To do TAVR and Rx CAD medically as patient wants to avoid open surgery given advanced age   14. Flutter good rate control coumadin held on heparin INR 1.2  3. TAVR:  29 mm Sapien 3 valve normal function SBE   Jenkins Rouge 08/31/2017, 8:19 AM

## 2017-09-01 DIAGNOSIS — I25119 Atherosclerotic heart disease of native coronary artery with unspecified angina pectoris: Secondary | ICD-10-CM

## 2017-09-01 DIAGNOSIS — R072 Precordial pain: Secondary | ICD-10-CM

## 2017-09-01 LAB — BASIC METABOLIC PANEL
Anion gap: 7 (ref 5–15)
BUN: 14 mg/dL (ref 8–23)
CO2: 23 mmol/L (ref 22–32)
Calcium: 8.7 mg/dL — ABNORMAL LOW (ref 8.9–10.3)
Chloride: 109 mmol/L (ref 98–111)
Creatinine, Ser: 0.92 mg/dL (ref 0.61–1.24)
GFR calc Af Amer: >60 mL/min (ref >60)
GFR calc non Af Amer: >60 mL/min (ref >60)
Glucose, Bld: 103 mg/dL — ABNORMAL HIGH (ref 70–99)
Potassium: 3.8 mmol/L (ref 3.5–5.1)
Sodium: 139 mmol/L (ref 135–145)

## 2017-09-01 LAB — CBC
HCT: 43.9 % (ref 39.0–52.0)
Hemoglobin: 15 g/dL (ref 13.0–17.0)
MCH: 33 pg (ref 26.0–34.0)
MCHC: 34.2 g/dL (ref 30.0–36.0)
MCV: 96.5 fL (ref 78.0–100.0)
Platelets: 121 10*3/uL — ABNORMAL LOW (ref 150–400)
RBC: 4.55 MIL/uL (ref 4.22–5.81)
RDW: 13.3 % (ref 11.5–15.5)
WBC: 7.7 10*3/uL (ref 4.0–10.5)

## 2017-09-01 LAB — PROTIME-INR
INR: 1.21
Prothrombin Time: 15.2 seconds (ref 11.4–15.2)

## 2017-09-01 MED ORDER — METOPROLOL SUCCINATE ER 25 MG PO TB24: 37.5000 mg | ORAL_TABLET | Freq: Every day | ORAL | Status: DC

## 2017-09-01 MED ORDER — WARFARIN SODIUM 5 MG PO TABS: 5.0000 mg | ORAL_TABLET | Freq: Once | ORAL | Status: DC

## 2017-09-01 MED ORDER — METOPROLOL SUCCINATE ER 25 MG PO TB24: 37.5000 mg | ORAL_TABLET | Freq: Every day | ORAL | 0 refills | Status: DC

## 2017-09-01 MED ORDER — METOPROLOL SUCCINATE ER 25 MG PO TB24
12.5000 mg | ORAL_TABLET | Freq: Once | ORAL | Status: DC
  Filled 2017-09-01: qty 1

## 2017-09-01 MED ORDER — WARFARIN - PHARMACIST DOSING INPATIENT: Freq: Every day | Status: DC

## 2017-09-01 NOTE — Progress Notes (Addendum)
Progress Note  Patient Name: Tyler Williams Date of Encounter: 09/01/2017  Primary Cardiologist: Sherren Mocha, MD   Subjective   No chest pain  Inpatient Medications    Scheduled Meds: . amLODipine  10 mg Oral Daily  . aspirin  81 mg Oral Daily  . atorvastatin  20 mg Oral QPM  . fesoterodine  8 mg Oral QPM  . furosemide  40 mg Oral Daily  . isosorbide mononitrate  60 mg Oral Daily  . LORazepam  1 mg Oral QHS  . metoprolol succinate  25 mg Oral Daily  . polyethylene glycol  17 g Oral Daily  . ramipril  10 mg Oral BID  . senna-docusate  1 tablet Oral BID  . sodium chloride flush  3 mL Intravenous Q12H   Continuous Infusions: . sodium chloride     PRN Meds: sodium chloride, acetaminophen, bisacodyl, gi cocktail, hydrALAZINE, morphine injection, nitroGLYCERIN, ondansetron (ZOFRAN) IV, sodium chloride flush   Vital Signs    Vitals:   08/31/17 2010 08/31/17 2015 08/31/17 2030 09/01/17 0327  BP: (!) 160/53 (!) 147/53 (!) 149/53 (!) 141/61  Pulse: 81 83 74 70  Resp: (!) 24 20 20 16   Temp:    98.2 F (36.8 C)  TempSrc:    Oral  SpO2: 96% 96% 97% 100%  Weight:    177 lb 4 oz (80.4 kg)  Height:        Intake/Output Summary (Last 24 hours) at 09/01/2017 0711 Last data filed at 09/01/2017 0600 Gross per 24 hour  Intake 1611.2 ml  Output 2125 ml  Net -513.8 ml   Filed Weights   08/29/17 0600 08/31/17 0642 09/01/17 0327  Weight: 176 lb 9.6 oz (80.1 kg) 179 lb 14.3 oz (81.6 kg) 177 lb 4 oz (80.4 kg)    Telemetry    A fib with PVCs - Personally Reviewed  ECG    A flutter with varying block RBBB- Personally Reviewed  Physical Exam   Per Dr. Claiborne Billings  GEN: No acute distress.   Neck: No JVD Cardiac: RRR, no murmurs, rubs, or gallops.  Respiratory: Clear to auscultation bilaterally. GI: Soft, nontender, non-distended  MS: No edema; No deformity. Neuro:  Nonfocal  Psych: Normal affect   Labs    Chemistry Recent Labs  Lab 08/27/17 2255 08/31/17 0530  09/01/17 0216  NA 140 139 139  K 4.0 3.9 3.8  CL 108 107 109  CO2 24 23 23   GLUCOSE 156* 86 103*  BUN 24* 15 14  CREATININE 1.01 0.77 0.92  CALCIUM 9.0 9.0 8.7*  GFRNONAA >60 >60 >60  GFRAA >60 >60 >60  ANIONGAP 8 9 7      Hematology Recent Labs  Lab 08/30/17 0430 08/31/17 0530 09/01/17 0216  WBC 6.5 6.1 7.7  RBC 4.75 4.88 4.55  HGB 15.6 16.1 15.0  HCT 46.2 46.9 43.9  MCV 97.3 96.1 96.5  MCH 32.8 33.0 33.0  MCHC 33.8 34.3 34.2  RDW 13.1 13.2 13.3  PLT 120* 123* 121*    Cardiac Enzymes Recent Labs  Lab 08/28/17 0350 08/28/17 1540 08/28/17 2112  TROPONINI 0.08* 0.06* 0.07*    Recent Labs  Lab 08/27/17 2303  TROPIPOC 0.04     BNPNo results for input(s): BNP, PROBNP in the last 168 hours.   DDimer No results for input(s): DDIMER in the last 168 hours.   Radiology    No results found.  Cardiac Studies   Cardiac cath 08/31/17   Mid RCA-1 lesion is 95%  stenosed.  Mid RCA-2 lesion is 85% stenosed.  Mid LM lesion is 30% stenosed.  Post intervention, there is a 90% residual stenosis.  Balloon angioplasty was performed using a BALLOON SAPPHIRE 2.5X12.  LV end diastolic pressure is normal.   1. Single vessel obstructive CAD involving the mid RCA 2. Normal LVEDP 3. No AV gradient.  4. Unsuccessful PCI of the RCA due to inability to expand the lesion. I do not feel that any other attempt at opening the RCA is feasible and would continue medical management.   Recommend to resume Warfarin, at currently prescribed dose and frequency, on 09/01/17.  Recommend concurrent antiplatelet therapy of Aspirin 81mg  daily for 1 month.  Echo 08/28/17  Study Conclusions  - Left ventricle: The cavity size was normal. Wall thickness was   increased in a pattern of mild LVH. Systolic function was normal.   The estimated ejection fraction was in the range of 55% to 60%.   There is akinesis of the basalinferolateral myocardium. - Aortic valve: A bioprosthesis was  present. There was trivial   regurgitation. - Mitral valve: Calcified annulus. There was mild regurgitation. - Left atrium: The atrium was severely dilated. - Right atrium: The atrium was moderately dilated. - Pulmonary arteries: Systolic pressure was mildly increased. PA   peak pressure: 43 mm Hg (S).  Impressions:  - Akinesis of the inferolateral wall with overall preserved LV   systolic function; s/p AVR with trace AI; mild MR; biatrial   enlargement; trace TR with mild pulmonary hypertension.  Patient Profile     84 y.o. male with a hx of aortic valve disease status post TAVR in May 2018, permanent atrial fibrillation on coumadin, AAA s/p remote repair, chronic diastolic CHF and coronary artery disease,now admitted with chest pain and pk troponin 0.08.  Cath yesterday with unsuccessful PCI to RCA - medical management is recommended.  Pt with 2 MRN numbers they need to merge.   Assessment & Plan    Unstable Angina  pk troponin 0.08, no EKG changes.  Cath 08/31/17 with attempted PCI RCA unsuccessful.  If ambulates without problems could be discharged --in addition to coumadin Dr. Martinique recommends ASA 81 mg daily for 1 month.  --continue current meds  Permanent A flutter/Fib with rate control, Coumadin to resume today at previous dose.    Hx TAVR 06/2016 stable see Echo  HTN controlled.   HLD continue statin.        For questions or updates, please contact [facility] Please consult [email or web address] for contact info under Cardiology/STEMI.      Signed, Cecilie Kicks, NP  09/01/2017, 7:11 AM     Patient seen and examined. Agree with assessment and plan. Feels well; no chest pain.  R groin cath site stable. 1-2/6 SEM c/w TAVR; no AR.  Unsuccessful expansion of RCA lesion at attempted PCI.  Will titrate metoprolol to 37.5 mg bid, and continue nitrates, amlodipine for anti-ischemic regimen.  Resume warfarin.  OK to dc today; F/U with Dr. Burt Knack.   Troy Sine, MD,  [facility] 09/01/2017 8:08 AM

## 2017-09-01 NOTE — Progress Notes (Signed)
ANTICOAGULATION CONSULT NOTE - Initial Consult  Pharmacy Consult for Warfarin Indication: atrial fibrillation  No Known Allergies  Patient Measurements: Height: 5\' 8"  (172.7 cm) Weight: 177 lb 4 oz (80.4 kg) IBW/kg (Calculated) : 68.4  Vital Signs: Temp: 98.2 F (36.8 C) (07/20 0720) Temp Source: Oral (07/20 0720) BP: 157/64 (07/20 0720) Pulse Rate: 78 (07/20 0720)  Labs: Recent Labs    08/30/17 0430 08/30/17 1319 08/30/17 2150 08/31/17 0530 09/01/17 0216  HGB 15.6  --   --  16.1 15.0  HCT 46.2  --   --  46.9 43.9  PLT 120*  --   --  123* 121*  LABPROT 17.6*  --   --  15.2 15.2  INR 1.46  --   --  1.21 1.21  HEPARINUNFRC 0.73* 0.62 0.79* 0.38  --   CREATININE  --   --   --  0.77 0.92    Estimated Creatinine Clearance: 52.7 mL/min (by C-G formula based on SCr of 0.92 mg/dL).   Medical History: Past Medical History:  Diagnosis Date  . A-fib ([place])   . Coronary artery disease   . Hypertension     Medications:  Scheduled:  . amLODipine  10 mg Oral Daily  . aspirin  81 mg Oral Daily  . atorvastatin  20 mg Oral QPM  . fesoterodine  8 mg Oral QPM  . furosemide  40 mg Oral Daily  . isosorbide mononitrate  60 mg Oral Daily  . LORazepam  1 mg Oral QHS  . [START ON 09/02/2017] metoprolol succinate  37.5 mg Oral Daily  . polyethylene glycol  17 g Oral Daily  . ramipril  10 mg Oral BID  . senna-docusate  1 tablet Oral BID  . sodium chloride flush  3 mL Intravenous Q12H    Assessment: 84 YO male who presented with chest pain. On Coumadin 5mg  daily except for 7.5mg  on Mon PTA for history of afib. On admit, his INR was therapeutic at 2.07. Pt received vitamin K 5 mg PO on 7/16 and vitamin K 10 mg subQ on 7/17, after which his INR dropped to 1.46. Coumadin was held and he was placed on heparin in anticipation for cath. He is now being restarted on Coumadin s/p unsuccessful cath and per cardiology no further plans to attempt another cath. Of note, pt has also been started  on Aspirin 81 mg PO daily.  7/20: INR is subtherapeutic at 1.21 after multiple held doses of Coumadin. INR likely to trend up slowly due to prolonged effects Vitamin K doses.   Goal of Therapy:  INR 2-3 Monitor platelets by anticoagulation protocol: Yes   Plan:  Warfarin 5 mg PO x1 (consistent with pt's home regimen) Monitor daily INR Monitor for s/sx of bleeding  Jackson Latino, PharmD PGY1 Pharmacy Resident Phone [phone] 09/01/2017     9:11 AM

## 2017-09-01 NOTE — Discharge Instructions (Signed)
Call [facility] at [phone] if any bleeding, swelling or drainage at cath site.  May shower, no tub baths for 48 hours for groin sticks. No lifting over 5 pounds for 3 days.  No Driving for 3 days  Heart Healthy Diet

## 2017-09-01 NOTE — Progress Notes (Signed)
CARDIAC REHAB PHASE I   PRE:  Rate/Rhythm: 81 afib PVCs  BP:  Supine:   Sitting: 189/65  Standing:    SaO2: 97%RA  MODE:  Ambulation: 500 ft   POST:  Rate/Rhythm: 91 afib PVCs  BP:  Supine:   Sitting: 184/58  Standing:    SaO2: 98%RA 0750-0825 Pt walked 500 ft on RA with steady gait. No CP. Tolerated well. Reviewed NTG use, ex ed and gave heart healthy diet. Notified RN of elevated BP and meds given. Discussed CRP 2. Pt attended [place] program years ago. Will refer but pt has macular degeneration and does not drive and would not have transportation.   Graylon Good, RN BSN  09/01/2017 8:21 AM

## 2017-09-01 NOTE — Discharge Summary (Signed)
Physician Discharge Summary  Tyler Williams HFW:[id number] DOB: 12-Sep-1926 DOA: 08/27/2017  PCP: Burnard Bunting, MD  Admit date: 08/27/2017 Discharge date: 09/01/2017  Time spent: 40  minutes  Recommendations for Outpatient Follow-up:  PCP in 1 week Cardiology 1 to 2 weeks  Discharge Diagnoses:  Principal Problem:   Chest pain Active Problems:   HLD (hyperlipidemia)   Anticoagulated on Coumadin   HTN (hypertension)   S/P TAVR (transcatheter aortic valve replacement)   Unstable angina [facility])   Discharge Condition: Stable and improved  Diet recommendation: Cardiac healthy heart  Filed Weights   08/29/17 0600 08/31/17 0642 09/01/17 0327  Weight: 80.1 kg (176 lb 9.6 oz) 81.6 kg (179 lb 14.3 oz) 80.4 kg (177 lb 4 oz)    History of present illness:  Tyler Williams is a 84 y.o. male with a history of CAD s/p multiple PCI, TAVR 2018 by Dr. Burt Knack, AAA s/p remote repair, chronic HFpEF, and permanent AFib/AFlutter on coumadin who presented to the ED after an episode of chest pain/pressure which started abruptly while sitting watching television. He took NTG (though later noted this expired in 2013), ativan, and ASA 325mg  without much improvement and ultimately called EMS. In the ED he had elevated BP, ECG showing AFib/flutter with controlle ventricular rate and RBBB which is chronic. CXR demonstrated cardiomegaly with bibasilar atelectasis. Troponin remained flat (0.08 >> 0.07). Vitamin K was given to reverse INR in preparation for repeat catheterization, though INR came to 1.76 and this was felt to be too high for femoral access. Heparin is started and plan is for Dr. Martinique to attempt to traverse RCA lesion on 7/19.  Catheterization attempted on 719 with failed attempt to stent RCA lesion.  Patient has remained chest pain-free throughout his hospitalization.  By the time of discharge he was stable and deemed medically managed from a cardiac standpoint and stable for discharge.  Hospital  Course:   unstable angina with known CAD, 95-99% calcified RCA stenosis, RBBB: Tn plateau 0.08 > 0.07.  - Cardiology Dr. Martinique cardiac cath 08/31/2017 with unsuccessful PCI of the RCA due to inability to expand the lesion - Continue metoprolol, ASA, statin (LDL is 78), imdur Recommend warfarin long-term in addition to aspirin 81 mg for the next month.  AFib:  Resume heparin Beta-blocker increased by cardiology service  HTN: BP improving.  - Continue norvasc, imdur, ramipril, metoprolol at discharge  Anxiety:  - Cotninue home ativan  Severe aortic stenosis s/p TAVR: Echo showed good position, trivial regurgitation.    Procedures: Cardiac cath 08/31/17   Mid RCA-1 lesion is 95% stenosed.  Mid RCA-2 lesion is 85% stenosed.  Mid LM lesion is 30% stenosed.  Post intervention, there is a 90% residual stenosis.  Balloon angioplasty was performed using a BALLOON SAPPHIRE 2.5X12.  LV end diastolic pressure is normal.  1. Single vessel obstructive CAD involving the mid RCA 2. Normal LVEDP 3. No AV gradient.  4. Unsuccessful PCI of the RCA due to inability to expand the lesion. I do not feel that any other attempt at opening the RCA is feasible and would continue medical management.   Recommend to resume Warfarin, at currently prescribed dose and frequency, on 09/01/17. Recommend concurrent antiplatelet therapy of Aspirin 81mg  daily for 1 month.  Consultations:  Cardiology  Discharge Exam: Vitals:   09/01/17 0327 09/01/17 0720  BP: (!) 141/61 (!) 157/64  Pulse: 70 78  Resp: 16 20  Temp: 98.2 F (36.8 C) 98.2 F (36.8 C)  SpO2:  100% 97%    General: Alert and oriented no apparent distress cooperative and friendly Cardiovascular: Regular rate and rhythm without murmurs rubs or gallops Respiratory: Clear to auscultation bilaterally no wheezes rhonchi rales  Discharge Instructions   Discharge Instructions    Amb Referral to Cardiac Rehabilitation   Complete by:   As directed    Diagnosis:  PTCA Comment - unsuccessful PCI   Diet - low sodium heart healthy   Complete by:  As directed    Increase activity slowly   Complete by:  As directed      Allergies as of 09/01/2017   No Known Allergies     Medication List    TAKE these medications   amLODipine 10 MG tablet Commonly known as:  NORVASC Take 10 mg by mouth daily.   aspirin 81 MG chewable tablet Chew 162 mg by mouth once.   atorvastatin 20 MG tablet Commonly known as:  LIPITOR Take 20 mg by mouth every evening.   CoQ-10 10 MG Caps Take 10 mg by mouth daily.   FISH OIL PO Take 1 tablet by mouth daily.   furosemide 40 MG tablet Commonly known as:  LASIX Take 40 mg by mouth daily.   isosorbide mononitrate 60 MG 24 hr tablet Commonly known as:  IMDUR Take 60 mg by mouth daily.   LORazepam 1 MG tablet Commonly known as:  ATIVAN Take 1 mg by mouth at bedtime.   LUCENTIS 0.5 MG/0.05ML Soln Generic drug:  ranibizumab 0.5 mg by Intravitreal route See admin instructions. Every 9 weeks   metoprolol succinate 25 MG 24 hr tablet Commonly known as:  TOPROL-XL Take 1.5 tablets (37.5 mg total) by mouth daily. Start taking on:  09/02/2017 What changed:  how much to take   multivitamin-lutein Caps capsule Take 1 capsule by mouth daily.   nitroGLYCERIN 0.4 MG SL tablet Commonly known as:  NITROSTAT Place 0.4 mg under the tongue every 5 (five) minutes as needed for chest pain.   ramipril 10 MG capsule Commonly known as:  ALTACE Take 10 mg by mouth 2 (two) times daily.   TOVIAZ 8 MG Tb24 tablet Generic drug:  fesoterodine Take 8 mg by mouth every evening.   Vitamin D3 5000 units Tabs Take 1 tablet by mouth daily.   warfarin 5 MG tablet Commonly known as:  COUMADIN Take 5-7.5 mg by mouth See admin instructions. Take 1 and 1/2 tablets on Monday then take 1 tablet all the other days      No Known Allergies Follow-up Information    Sherren Mocha, MD Follow up.    Specialty:  Cardiology Why:  the office will call for follow up appt should be seen in next 2 weeks Contact information: [address] [address] [phone]        [place] [facility] Follow up.   Specialty:  Cardiology Why:  will need coumadin check on Tuesday - call them on Monday Contact information: [address], [place]       Burnard Bunting, MD Follow up in 1 week(s).   Specialty:  Internal Medicine Contact information: [place] [address] [phone]            The results of significant diagnostics from this hospitalization (including imaging, microbiology, ancillary and laboratory) are listed below for reference.    Significant Diagnostic Studies: Dg Chest 2 View  Result Date: 08/27/2017 CLINICAL DATA:  Chest pain starting at 8 this evening. EXAM: CHEST - 2 VIEW COMPARISON:  None. FINDINGS: Cardiomegaly with moderate aortic atherosclerosis at the arch. Evidence of prior TAVR. No acute pulmonary consolidation or CHF. Bibasilar atelectasis is noted. Mild degenerative change along thoracic spine. IMPRESSION: Cardiomegaly with aortic atherosclerosis. TAVR. Bibasilar atelectasis. Electronically Signed   By: Ashley Royalty M.D.   On: 08/27/2017 23:18    Microbiology: Recent Results (from the past 240 hour(s))  Surgical PCR screen     Status: None   Collection Time: 08/28/17  9:49 PM  Result Value Ref Range Status   MRSA, PCR NEGATIVE NEGATIVE Final   Staphylococcus aureus NEGATIVE NEGATIVE Final    Comment: (NOTE) The Xpert SA Assay (FDA approved for NASAL specimens in patients 38 years of age and older), is one component of a comprehensive surveillance program. It is not intended to diagnose infection nor to guide or monitor treatment. Performed at [facility], [place] [address] [place], [place] [address]      Labs: Basic Metabolic  Panel: Recent Labs  Lab 08/27/17 2255 08/31/17 0530 09/01/17 0216  NA 140 139 139  K 4.0 3.9 3.8  CL 108 107 109  CO2 24 23 23   GLUCOSE 156* 86 103*  BUN 24* 15 14  CREATININE 1.01 0.77 0.92  CALCIUM 9.0 9.0 8.7*   Liver Function Tests: No results for input(s): AST, ALT, ALKPHOS, BILITOT, PROT, ALBUMIN in the last 168 hours. No results for input(s): LIPASE, AMYLASE in the last 168 hours. No results for input(s): AMMONIA in the last 168 hours. CBC: Recent Labs  Lab 08/28/17 0350 08/29/17 1149 08/30/17 0430 08/31/17 0530 09/01/17 0216  WBC 5.8 6.7 6.5 6.1 7.7  NEUTROABS 3.8  --   --   --   --   HGB 15.2 15.8 15.6 16.1 15.0  HCT 45.3 46.4 46.2 46.9 43.9  MCV 98.1 96.9 97.3 96.1 96.5  PLT 121* 119* 120* 123* 121*   Cardiac Enzymes: Recent Labs  Lab 08/28/17 0350 08/28/17 1540 08/28/17 2112  TROPONINI 0.08* 0.06* 0.07*   BNP: BNP (last 3 results) No results for input(s): BNP in the last 8760 hours.  ProBNP (last 3 results) No results for input(s): PROBNP in the last 8760 hours.  CBG: No results for input(s): GLUCAP in the last 168 hours.     Signed:  Phillips Grout MD.  [facility] 09/01/2017, 9:47 AM

## 2017-09-03 ENCOUNTER — Encounter: Payer: Self-pay | Admitting: Cardiovascular Disease

## 2017-09-03 MED FILL — Nitroglycerin IV Soln 100 MCG/ML in D5W: INTRA_ARTERIAL | Qty: 10 | Status: AC

## 2017-09-04 ENCOUNTER — Telehealth (HOSPITAL_COMMUNITY): Payer: Self-pay

## 2017-09-04 ENCOUNTER — Ambulatory Visit (INDEPENDENT_AMBULATORY_CARE_PROVIDER_SITE_OTHER): Payer: Medicare Other

## 2017-09-04 DIAGNOSIS — I482 Chronic atrial fibrillation: Secondary | ICD-10-CM | POA: Diagnosis not present

## 2017-09-04 DIAGNOSIS — I4891 Unspecified atrial fibrillation: Secondary | ICD-10-CM | POA: Diagnosis not present

## 2017-09-04 DIAGNOSIS — Z5181 Encounter for therapeutic drug level monitoring: Secondary | ICD-10-CM | POA: Diagnosis not present

## 2017-09-04 DIAGNOSIS — I4821 Permanent atrial fibrillation: Secondary | ICD-10-CM

## 2017-09-04 LAB — POCT INR: INR: 1.1 — AB (ref 2.0–3.0)

## 2017-09-04 NOTE — Patient Instructions (Signed)
Description   Take 1.5 tablets today and tomorrow, then resume same dosage 1 tablet every day except 1.5 tablets on Mondays.  Recheck INR in 1 week.  Call if placed on any new medications or if scheduled for any procedures.  Coumadin clinic [phone].

## 2017-09-04 NOTE — Telephone Encounter (Signed)
Referral recv'd, patient refused service per phrase I. ° °Closed referral °

## 2017-09-05 DIAGNOSIS — R82998 Other abnormal findings in urine: Secondary | ICD-10-CM | POA: Diagnosis not present

## 2017-09-05 DIAGNOSIS — I10 Essential (primary) hypertension: Secondary | ICD-10-CM | POA: Diagnosis not present

## 2017-09-05 DIAGNOSIS — E7849 Other hyperlipidemia: Secondary | ICD-10-CM | POA: Diagnosis not present

## 2017-09-05 DIAGNOSIS — Z125 Encounter for screening for malignant neoplasm of prostate: Secondary | ICD-10-CM | POA: Diagnosis not present

## 2017-09-11 ENCOUNTER — Ambulatory Visit (INDEPENDENT_AMBULATORY_CARE_PROVIDER_SITE_OTHER): Payer: Medicare Other

## 2017-09-11 DIAGNOSIS — Z5181 Encounter for therapeutic drug level monitoring: Secondary | ICD-10-CM | POA: Diagnosis not present

## 2017-09-11 DIAGNOSIS — I4891 Unspecified atrial fibrillation: Secondary | ICD-10-CM

## 2017-09-11 DIAGNOSIS — I482 Chronic atrial fibrillation: Secondary | ICD-10-CM

## 2017-09-11 DIAGNOSIS — I4821 Permanent atrial fibrillation: Secondary | ICD-10-CM

## 2017-09-11 LAB — POCT INR: INR: 1.7 — AB (ref 2.0–3.0)

## 2017-09-11 NOTE — Patient Instructions (Signed)
Description   Take 1.5 tablets today, then resume same dosage 1 tablet every day except 1.5 tablets on Mondays.  Recheck INR in 2 weeks.  Call if placed on any new medications or if scheduled for any procedures.  Coumadin clinic [phone].

## 2017-09-12 DIAGNOSIS — Z7901 Long term (current) use of anticoagulants: Secondary | ICD-10-CM | POA: Diagnosis not present

## 2017-09-12 DIAGNOSIS — Z6827 Body mass index (BMI) 27.0-27.9, adult: Secondary | ICD-10-CM | POA: Diagnosis not present

## 2017-09-12 DIAGNOSIS — I2510 Atherosclerotic heart disease of native coronary artery without angina pectoris: Secondary | ICD-10-CM | POA: Diagnosis not present

## 2017-09-12 DIAGNOSIS — D692 Other nonthrombocytopenic purpura: Secondary | ICD-10-CM | POA: Diagnosis not present

## 2017-09-12 DIAGNOSIS — K4090 Unilateral inguinal hernia, without obstruction or gangrene, not specified as recurrent: Secondary | ICD-10-CM | POA: Diagnosis not present

## 2017-09-12 DIAGNOSIS — E7849 Other hyperlipidemia: Secondary | ICD-10-CM | POA: Diagnosis not present

## 2017-09-12 DIAGNOSIS — I7389 Other specified peripheral vascular diseases: Secondary | ICD-10-CM | POA: Diagnosis not present

## 2017-09-12 DIAGNOSIS — I482 Chronic atrial fibrillation: Secondary | ICD-10-CM | POA: Diagnosis not present

## 2017-09-12 DIAGNOSIS — F418 Other specified anxiety disorders: Secondary | ICD-10-CM | POA: Diagnosis not present

## 2017-09-12 DIAGNOSIS — Z0000 Encounter for general adult medical examination without abnormal findings: Secondary | ICD-10-CM | POA: Diagnosis not present

## 2017-09-12 DIAGNOSIS — Z952 Presence of prosthetic heart valve: Secondary | ICD-10-CM | POA: Diagnosis not present

## 2017-09-12 DIAGNOSIS — I10 Essential (primary) hypertension: Secondary | ICD-10-CM | POA: Diagnosis not present

## 2017-09-13 ENCOUNTER — Encounter: Payer: Self-pay | Admitting: Podiatry

## 2017-09-13 ENCOUNTER — Ambulatory Visit (INDEPENDENT_AMBULATORY_CARE_PROVIDER_SITE_OTHER): Payer: Medicare Other | Admitting: Podiatry

## 2017-09-13 DIAGNOSIS — B351 Tinea unguium: Secondary | ICD-10-CM

## 2017-09-13 DIAGNOSIS — M79676 Pain in unspecified toe(s): Secondary | ICD-10-CM | POA: Diagnosis not present

## 2017-09-13 DIAGNOSIS — D689 Coagulation defect, unspecified: Secondary | ICD-10-CM

## 2017-09-15 NOTE — Progress Notes (Signed)
He presents today chief complaint of painful elongated toenails.  Objective:.  Toenails are long thick yellow dystrophic-like mycotic pulses remain palpable capillary fill time is immediate.  Assessment: Pain in limb secondary to onychomycosis.  Plan: Debridement of toenails 1 through 5 bilateral.

## 2017-09-20 DIAGNOSIS — H35363 Drusen (degenerative) of macula, bilateral: Secondary | ICD-10-CM | POA: Diagnosis not present

## 2017-09-20 DIAGNOSIS — H353231 Exudative age-related macular degeneration, bilateral, with active choroidal neovascularization: Secondary | ICD-10-CM | POA: Diagnosis not present

## 2017-09-25 ENCOUNTER — Encounter: Payer: Self-pay | Admitting: Physician Assistant

## 2017-09-25 ENCOUNTER — Ambulatory Visit (INDEPENDENT_AMBULATORY_CARE_PROVIDER_SITE_OTHER): Payer: Medicare Other | Admitting: Physician Assistant

## 2017-09-25 ENCOUNTER — Ambulatory Visit (INDEPENDENT_AMBULATORY_CARE_PROVIDER_SITE_OTHER): Payer: Medicare Other

## 2017-09-25 ENCOUNTER — Encounter (INDEPENDENT_AMBULATORY_CARE_PROVIDER_SITE_OTHER): Payer: Self-pay

## 2017-09-25 VITALS — BP 146/60 | HR 57 | Ht 68.0 in | Wt 186.0 lb

## 2017-09-25 DIAGNOSIS — I5032 Chronic diastolic (congestive) heart failure: Secondary | ICD-10-CM | POA: Diagnosis not present

## 2017-09-25 DIAGNOSIS — I482 Chronic atrial fibrillation: Secondary | ICD-10-CM | POA: Diagnosis not present

## 2017-09-25 DIAGNOSIS — Z5181 Encounter for therapeutic drug level monitoring: Secondary | ICD-10-CM

## 2017-09-25 DIAGNOSIS — I25119 Atherosclerotic heart disease of native coronary artery with unspecified angina pectoris: Secondary | ICD-10-CM | POA: Diagnosis not present

## 2017-09-25 DIAGNOSIS — I4891 Unspecified atrial fibrillation: Secondary | ICD-10-CM | POA: Diagnosis not present

## 2017-09-25 DIAGNOSIS — I4821 Permanent atrial fibrillation: Secondary | ICD-10-CM

## 2017-09-25 DIAGNOSIS — I10 Essential (primary) hypertension: Secondary | ICD-10-CM | POA: Diagnosis not present

## 2017-09-25 DIAGNOSIS — E785 Hyperlipidemia, unspecified: Secondary | ICD-10-CM

## 2017-09-25 DIAGNOSIS — Z952 Presence of prosthetic heart valve: Secondary | ICD-10-CM | POA: Diagnosis not present

## 2017-09-25 DIAGNOSIS — I5043 Acute on chronic combined systolic (congestive) and diastolic (congestive) heart failure: Secondary | ICD-10-CM | POA: Insufficient documentation

## 2017-09-25 LAB — POCT INR: INR: 2 (ref 2.0–3.0)

## 2017-09-25 MED ORDER — ISOSORBIDE MONONITRATE ER 60 MG PO TB24: 90.0000 mg | ORAL_TABLET | Freq: Every day | ORAL | 3 refills | Status: DC

## 2017-09-25 NOTE — Patient Instructions (Addendum)
Medication Instructions:  Increase your Isosorbide (Imdur) to 90 mg Once daily (you will take 1 and 1/2 tablets of the 60 mg tablet to equal 90 mg)  Continue aspirin 81 mg one tablet daily.   Labwork: None   Testing/Procedures: None   Follow-Up: Dr. Sherren Mocha on November 20 at 3 pm.   Any Other Special Instructions Will Be Listed Below (If Applicable).  If you need a refill on your cardiac medications before your next appointment, please call your pharmacy.

## 2017-09-25 NOTE — Patient Instructions (Signed)
Description   Take 1.5 tablets today, then resume same dosage 1 tablet every day except 1.5 tablets on Mondays.  Recheck INR in 3 weeks.  Call if placed on any new medications or if scheduled for any procedures.  Coumadin clinic [phone].

## 2017-09-25 NOTE — Progress Notes (Signed)
Cardiology Office Note:    Date:  09/25/2017   ID:  Tyler Williams, DOB 18-Sep-1926, MRN [id number]  PCP:  Burnard Bunting, MD  Cardiologist:  Sherren Mocha, MD   Referring MD: Burnard Bunting, MD   Chief Complaint  Patient presents with  . Hospitalization Follow-up    admx with chest pain    History of Present Illness:    Tyler Williams is a 84 y.o. male with atrial fibrillation, coronary artery disease status post angioplasty in the 1980s and 1990s, peripheral vascular disease, AAA status post repair, aortic stenosis status post TAVR in May 1224, diastolic heart failure, long-term anticoagulation with warfarin.  He has a known high-grade stenosis in the mid RCA.  He has had unsuccessful attempt at PCI of the RCA in the past.  He was last seen by Dr. Burt Knack in May 2019.  He was admitted 7/15-7/20 with chest pain.  Troponin levels were minimally elevated without clear trend.  He was seen by cardiology and cardiac catheterization was arranged.  This demonstrated high-grade mid RCA disease.  Attempt was made at angioplasty and stenting.  However, the lesion could not be expanded and the PCI attempt was unsuccessful.  Continued medical therapy was recommended.  Mr. [name] returns for posthospitalization follow-up.  He is here alone.  Since discharge from the hospital, he has done well.  He denies any further chest pain.  He denies shortness of breath, syncope, paroxysmal nocturnal dyspnea.  He has chronic leg swelling without significant change.  He wears compression stockings.  Prior CV studies:   The following studies were reviewed today:  Cardiac catheterization 08/31/2017 LM mid 30 LAD normal LCx normal RCA mid 95, 85 LVEDP 12 PCI: Unsuccessful to the RCA  Echo 08/28/2017 Mild LVH, EF 55-60, inferolateral akinesis, bioprosthetic aortic valve with trivial AI, MAC, mild MR, severe LAE, moderate RAE, PASP 43  Echo 06/20/2017 EF 60-65, inferolateral akinesis, bioprosthetic  aortic valve with mean gradient 7, MAC, mild MR, severe LAE, severe RAE, mild TR, PASP 53  24-hour Holter 02/01/2017 The basic rhythm is marked sinus bradycardia with average HR 48 bpm There are PVC's, but no sustained arrhythmia No atrial fibrillation noted No tachy-arrhythmias  Carotid US 12/04/2016 Bilateral ICA 1-39  Echo 07/05/2016 Moderate LVH, EF 60-65, normal wall motion, bioprosthetic aortic valve, mild MR, severe BAE, PASP 48  Echo 06/15/16 Mild LVH, EF 55-60, TAVR with trace perivalvular AI and peak gradient 16, mild MR, severe BAE, PASP 50   LHC 05/12/16 LAD mid 25 RI irregularities LCx mid 30 RCA proximal 40, mid 95, distal 40 Mean AV gradient 35, AVA 1.06 cm   Carotid US 10/17 R 40-59; L1-39 >> follow-up 1 year   Past Medical History:  Diagnosis Date  . A-fib ([place])   . AAA (abdominal aortic aneurysm) ([place])    s/p repair  . Aortic stenosis, mild    a. mean gradient 17 mmHg on 04/2012 TTE  . Arthritis    knees  . CAD (coronary artery disease)    a. s/p multiple percutaneous prior coronary artery interventions b. 05/2012: unsuccessful PCI to tight RCA->medically managed  . Cancer [facility])    skin cancer removed, right hand, left leg, chest  . Carotid artery disease ([place])    0-39% bilateral ICA stenoses 11/2011  . Chronic anticoagulation    Coumadin  . Coronary artery disease   . GERD (gastroesophageal reflux disease)   . GERD (gastroesophageal reflux disease)   . History of pulmonary embolism  1964 related to dislocated hip on right   . History of skin cancer   . Hypercholesteremia   . Hypertension   . Incidental cecal mass noted on CT imaging 05/24/2016   **An incidental finding of potential clinical significance has been found. 4.6 x 5.6 x 3.7 cm masslike lesion in the cecum adjacent to the ileocecal valve may represent a large polyp or colonic neoplasm. Correlation with nonemergent colonoscopy is strongly recommended in the near future to better evaluate  this finding.**  . Macular degeneration    eye injections  . Microscopic hematuria    Followed by urology  . Permanent atrial fibrillation ([place])   . PVD (peripheral vascular disease) ([place])    Bilateral renal artery atherosclerosis, SMA stenosis with collateralization  . RBBB    Chronic  . 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 SSS (sick sinus syndrome) ([place])    Surgical Hx: The patient  has a past surgical history that includes orthopedic surgery; Coronary angioplasty (1980, 1990); Hernia repair; Abdominal aortic aneurysm repair; Other surgical history; Tonsillectomy; Other surgical history; Hemorrhoid surgery; Eye surgery; Partial knee arthroplasty (01/22/2012); Joint replacement; Cardiac catheterization (05/24/2012); Cholecystectomy (N/A, 12/05/2012); left heart catheterization with coronary angiogram (N/A, 05/24/2012); Percutaneous coronary intervention-balloon only (05/24/2012); RIGHT/LEFT HEART CATH AND CORONARY ANGIOGRAPHY (N/A, 05/12/2016); Transcatheter aortic valve replacement, transfemoral (N/A, 06/13/2016); TEE without cardioversion (N/A, 06/13/2016); Cholecystectomy; Total knee arthroplasty (Left); LEFT HEART CATH AND CORONARY ANGIOGRAPHY (N/A, 08/31/2017); and CORONARY BALLOON ANGIOPLASTY (N/A, 08/31/2017).   Current Medications: Current Meds  Medication Sig  . amLODipine (NORVASC) 10 MG tablet Take 10 mg by mouth daily.  Marland Kitchen aspirin 81 MG chewable tablet Chew 81 mg by mouth once.   Marland Kitchen aspirin EC 81 MG tablet Take 81 mg by mouth daily.  Marland Kitchen atorvastatin (LIPITOR) 20 MG tablet Take 20 mg by mouth every evening.  . Calcium-Magnesium-Zinc (CAL-MAG-ZINC PO) Take 1 tablet by mouth daily.  . Cholecalciferol (VITAMIN D3) 5000 units TABS Take 1 tablet by mouth daily.  . Coenzyme Q10 (COQ-10) 10 MG CAPS Take 10 mg by mouth daily.  . fesoterodine (TOVIAZ) 8 MG TB24 tablet Take 8  mg by mouth every evening.  . furosemide (LASIX) 40 MG tablet Take 40 mg by mouth daily.  Marland Kitchen LORazepam (ATIVAN) 1 MG tablet Take 1 mg by mouth at bedtime.  . metoprolol succinate (TOPROL-XL) 25 MG 24 hr tablet Take 1.5 tablets (37.5 mg total) by mouth daily.  . multivitamin-lutein (OCUVITE-LUTEIN) CAPS capsule Take 1 capsule by mouth daily.  . nitroGLYCERIN (NITROSTAT) 0.4 MG SL tablet Place 0.4 mg under the tongue every 5 (five) minutes as needed for chest pain.  . Omega-3 Fatty Acids (FISH OIL PO) Take 1 tablet by mouth daily.  . ramipril (ALTACE) 10 MG capsule Take 10 mg by mouth 2 (two) times daily.  . ranibizumab (LUCENTIS) 0.5 MG/0.05ML SOLN 0.5 mg by Intravitreal route See admin instructions. Every 9 weeks  . warfarin (COUMADIN) 5 MG tablet Take 5-7.5 mg by mouth See admin instructions. Take 1 and 1/2 tablets on Monday then take 1 tablet all the other days  . [DISCONTINUED] isosorbide mononitrate (IMDUR) 60 MG 24 hr tablet Take 60 mg by mouth daily.     Allergies:   No known allergies   Social History   Tobacco Use  . Smoking status: Never Smoker  . Smokeless tobacco: Never Used  Substance Use Topics  .  Alcohol use: Not Currently    Frequency: Never    Comment: patient doesn't drink anymore  . Drug use: Never     Family Hx: The patient's family history includes Heart attack (age of onset: 75) in his mother.  ROS:   Please see the history of present illness.    ROS All other systems reviewed and are negative.   EKGs/Labs/Other Test Reviewed:    EKG:  EKG is  ordered today.  The ekg ordered today demonstrates AFlutter, HR 57, RBBB  Recent Labs: 09/01/2017: BUN 14; Creatinine, Ser 0.92; Hemoglobin 15.0; Platelets 121; Potassium 3.8; Sodium 139   Recent Lipid Panel Lab Results  Component Value Date/Time   CHOL 149 08/28/2017 03:50 AM   TRIG 95 08/28/2017 03:50 AM   HDL 52 08/28/2017 03:50 AM   CHOLHDL 2.9 08/28/2017 03:50 AM   LDLCALC 78 08/28/2017 03:50 AM     Physical Exam:    VS:  BP (!) 146/60   Pulse (!) 57   Ht _0  (1.727 m)   Wt 186 lb (84.4 kg)   SpO2 97%   BMI 28.28 kg/m     Wt Readings from Last 3 Encounters:  09/25/17 186 lb (84.4 kg)  09/01/17 177 lb 4 oz (80.4 kg)  06/20/17 190 lb (86.2 kg)     Physical Exam  Constitutional: He is oriented to person, place, and time. He appears well-developed and well-nourished. No distress.  HENT:  Head: Normocephalic and atraumatic.  Eyes: No scleral icterus.  Neck: Neck supple. No JVD present.  Cardiovascular: Normal rate, regular rhythm, S1 normal and S2 normal.  No murmur heard. Pulmonary/Chest: Breath sounds normal. He has no rales.  Abdominal: Soft. There is no hepatomegaly.  Musculoskeletal: He exhibits edema (trace ankle edema bilat).  R groin without hematoma or bruit  Neurological: He is alert and oriented to person, place, and time.  Skin: Skin is warm and dry.  Psychiatric: He has a normal mood and affect.    ASSESSMENT & PLAN:    Coronary artery disease involving native coronary artery of native heart with angina pectoris (HCC) Hx of multiple PCI procedures in the past.  He has known high grade stenosis of the mid RCA with unsuccessful attempt at PCI in the past.  He was recently admitted with recurrent angina and non-specific Troponin elevation.  As noted, attempt at PCI of the RCA was unsuccessful.  Medical therapy was continued.  He is doing well without recurrent angina on his current medical regimen.  Continue amlodipine, aspirin, atorvastatin, beta-blocker, nitrates.  I will adjust his isosorbide for better blood pressure control.  Permanent atrial fibrillation (HCC) He appears to be in atrial flutter today.  He is asymptomatic.  He is tolerating anticoagulation well.  Coumadin is managed in our Coumadin clinic.  Continue current therapy.  Chronic diastolic CHF (congestive heart failure) (HCC) NYHA 2.  Volume status appears stable.  Continue current medical  regimen.  Essential hypertension Blood pressure is above target.  Continue current dose of amlodipine, metoprolol, Altace.  I will increase Imdur to 90 mg daily.  Hyperlipidemia, unspecified hyperlipidemia type Continue statin therapy.  S/P TAVR (transcatheter aortic valve replacement) Normally functioning bioprosthetic valve in July 2019.  Continue SBE prophylaxis.      Dispo:  Return in about 3 months (around 12/26/2017) for Routine Follow Up, w/ Dr. Burt Knack.   Medication Adjustments/Labs and Tests Ordered: Current medicines are reviewed at length with the patient today.  Concerns regarding medicines are outlined  above.  Tests Ordered: Orders Placed This Encounter  Procedures  . EKG 12-Lead   Medication Changes: Meds ordered this encounter  Medications  . isosorbide mononitrate (IMDUR) 60 MG 24 hr tablet    Sig: Take 1.5 tablets (90 mg total) by mouth daily.    Dispense:  135 tablet    Refill:  3    DOSE INCREASE. PT REQUEST MEDICATION BE DELIVERED.    Signed, [name], PA-C  09/25/2017 1:04 PM    [facility] [name], [place], [place]  [address] Phone: [phone]; Fax: [phone]

## 2017-10-01 ENCOUNTER — Other Ambulatory Visit: Payer: Self-pay | Admitting: Physician Assistant

## 2017-10-01 MED ORDER — METOPROLOL SUCCINATE ER 25 MG PO TB24: 37.5000 mg | ORAL_TABLET | Freq: Every day | ORAL | 3 refills | Status: DC

## 2017-10-11 ENCOUNTER — Other Ambulatory Visit: Payer: Self-pay

## 2017-10-11 ENCOUNTER — Telehealth: Payer: Self-pay | Admitting: Physician Assistant

## 2017-10-11 MED ORDER — ISOSORBIDE MONONITRATE ER 60 MG PO TB24: 60.0000 mg | ORAL_TABLET | Freq: Every day | ORAL | 3 refills | Status: DC

## 2017-10-11 NOTE — Telephone Encounter (Signed)
New message  Pt c/o medication issue:  1. Name of Medication: isosorbide mononitrate (IMDUR) 60 MG 24 hr tablet  2. How are you currently taking this medication (dosage and times per day)? Taking 11/2 pill in the morning  3. Are you having a reaction (difficulty breathing--STAT)? No   4. What is your medication issue? Patient states that his bp is staying the same and he wants to stop taking the 1/2 pill. Patient feels that this 1/2 tablet is not benefiting him.

## 2017-10-11 NOTE — Telephone Encounter (Signed)
The patient is calling because since he had his Imdur increased to 90 mg daily, his BP has not changed.  He still records 149/66.  He does not like taking the 1.5 pills and would like to eliminate the other half.  I told him that the BP was still elevated and I would forward for advisement.  Thank you

## 2017-10-11 NOTE — Telephone Encounter (Signed)
Ok to decrease Imdur back to 60 mg QD. Monitor BP at home. Bring in list of BP readings to next OV. Call if BP >150/90 on a regular basis. [name], PA-C    10/11/2017 3:39 PM

## 2017-10-11 NOTE — Telephone Encounter (Signed)
Inform patient of Scott's recommendations.  He verbalized understanding and has agreed to decrease his Isosorbide Mononitrate to 60 mg QD.  He will update [place] on his BP readings.

## 2017-10-16 DIAGNOSIS — D225 Melanocytic nevi of trunk: Secondary | ICD-10-CM | POA: Diagnosis not present

## 2017-10-16 DIAGNOSIS — L821 Other seborrheic keratosis: Secondary | ICD-10-CM | POA: Diagnosis not present

## 2017-10-16 DIAGNOSIS — D2261 Melanocytic nevi of right upper limb, including shoulder: Secondary | ICD-10-CM | POA: Diagnosis not present

## 2017-10-16 DIAGNOSIS — D2262 Melanocytic nevi of left upper limb, including shoulder: Secondary | ICD-10-CM | POA: Diagnosis not present

## 2017-10-16 DIAGNOSIS — Z85828 Personal history of other malignant neoplasm of skin: Secondary | ICD-10-CM | POA: Diagnosis not present

## 2017-10-16 DIAGNOSIS — L570 Actinic keratosis: Secondary | ICD-10-CM | POA: Diagnosis not present

## 2017-10-16 DIAGNOSIS — D485 Neoplasm of uncertain behavior of skin: Secondary | ICD-10-CM | POA: Diagnosis not present

## 2017-10-19 ENCOUNTER — Ambulatory Visit (INDEPENDENT_AMBULATORY_CARE_PROVIDER_SITE_OTHER): Payer: Medicare Other | Admitting: Pharmacist

## 2017-10-19 DIAGNOSIS — Z5181 Encounter for therapeutic drug level monitoring: Secondary | ICD-10-CM

## 2017-10-19 DIAGNOSIS — I482 Chronic atrial fibrillation: Secondary | ICD-10-CM

## 2017-10-19 DIAGNOSIS — I4891 Unspecified atrial fibrillation: Secondary | ICD-10-CM | POA: Diagnosis not present

## 2017-10-19 DIAGNOSIS — I4821 Permanent atrial fibrillation: Secondary | ICD-10-CM

## 2017-10-19 LAB — POCT INR: INR: 3.1 — AB (ref 2.0–3.0)

## 2017-10-19 NOTE — Telephone Encounter (Signed)
Reviewed BP readings patient left at office today. Most readings are close to goal. Given his age, we can allow his BP goal to be a little higher than current guideline recommendations. PLAN:   - Continue current medications.  - Continue to monitor blood pressure at home   - Call if readings consistently >/= 150/90. [name], PA-C    10/19/2017 2:30 PM

## 2017-10-19 NOTE — Patient Instructions (Signed)
Description   Take only 1/2 tablet, then resume same dosage 1 tablet every day except 1.5 tablets on Mondays.  Recheck INR in 3 weeks.  Call if placed on any new medications or if scheduled for any procedures.  Coumadin clinic [phone].

## 2017-10-22 ENCOUNTER — Telehealth (HOSPITAL_COMMUNITY): Payer: Self-pay

## 2017-10-22 NOTE — Telephone Encounter (Signed)
Pt has been notified of recommendations based on BP readings. Pt is agreeable to continue on decreased dose of Imdur 60 mg daily. Pt will continue to monitor BP and call if consisently >150/90. Pt thanked me for the call.

## 2017-10-22 NOTE — Telephone Encounter (Signed)
Patients insurance is active and benefits verified through Medicare A/B - No co-pay, deductible amount of $185.00/$185.00 has been met, no out of pocket, 20% co-insurance, and no pre-authorization is required. Passport/reference [phone]  Patients insurance is active and benefits verified through [employer] [place] - No co-pay, no deductible, no out of pocket, no co-insurance, and no pre-authorization is required. Passport/reference [phone]  Referral passed to RN Navigator for review.

## 2017-10-23 ENCOUNTER — Telehealth (HOSPITAL_COMMUNITY): Payer: Self-pay

## 2017-10-23 DIAGNOSIS — D485 Neoplasm of uncertain behavior of skin: Secondary | ICD-10-CM | POA: Diagnosis not present

## 2017-10-23 DIAGNOSIS — Z85828 Personal history of other malignant neoplasm of skin: Secondary | ICD-10-CM | POA: Diagnosis not present

## 2017-10-23 DIAGNOSIS — L814 Other melanin hyperpigmentation: Secondary | ICD-10-CM | POA: Diagnosis not present

## 2017-10-23 NOTE — Telephone Encounter (Signed)
Called patient to see if he was interested in participating in the Cardiac Rehab Program. Patient stated yes. Patient will come in for orientation on 12/13/17 @ 8:15AM and will attend the 1:15PM exercise class.  Mailed homework package.  Went over insurance, patient verbalized understanding.

## 2017-11-01 DIAGNOSIS — H353211 Exudative age-related macular degeneration, right eye, with active choroidal neovascularization: Secondary | ICD-10-CM | POA: Diagnosis not present

## 2017-11-01 DIAGNOSIS — H353231 Exudative age-related macular degeneration, bilateral, with active choroidal neovascularization: Secondary | ICD-10-CM | POA: Diagnosis not present

## 2017-11-01 DIAGNOSIS — H35363 Drusen (degenerative) of macula, bilateral: Secondary | ICD-10-CM | POA: Diagnosis not present

## 2017-11-01 DIAGNOSIS — H353221 Exudative age-related macular degeneration, left eye, with active choroidal neovascularization: Secondary | ICD-10-CM | POA: Diagnosis not present

## 2017-11-09 ENCOUNTER — Encounter (INDEPENDENT_AMBULATORY_CARE_PROVIDER_SITE_OTHER): Payer: Self-pay

## 2017-11-09 ENCOUNTER — Ambulatory Visit (INDEPENDENT_AMBULATORY_CARE_PROVIDER_SITE_OTHER): Payer: Medicare Other | Admitting: *Deleted

## 2017-11-09 DIAGNOSIS — Z5181 Encounter for therapeutic drug level monitoring: Secondary | ICD-10-CM | POA: Diagnosis not present

## 2017-11-09 DIAGNOSIS — I4891 Unspecified atrial fibrillation: Secondary | ICD-10-CM | POA: Diagnosis not present

## 2017-11-09 DIAGNOSIS — I4821 Permanent atrial fibrillation: Secondary | ICD-10-CM

## 2017-11-09 LAB — POCT INR: INR: 2.3 (ref 2.0–3.0)

## 2017-11-09 NOTE — Patient Instructions (Signed)
Description   Continue taking 1 tablet every day except 1.5 tablets on Mondays.  Recheck INR in 4 weeks.  Call if placed on any new medications or if scheduled for any procedures.  Coumadin clinic #[phone].       

## 2017-11-17 DIAGNOSIS — Z23 Encounter for immunization: Secondary | ICD-10-CM | POA: Diagnosis not present

## 2017-11-26 DIAGNOSIS — N3281 Overactive bladder: Secondary | ICD-10-CM | POA: Diagnosis not present

## 2017-12-04 ENCOUNTER — Telehealth (HOSPITAL_COMMUNITY): Payer: Self-pay

## 2017-12-04 NOTE — Telephone Encounter (Signed)
Cardiac Rehab Medication Review by a Pharmacist  Does the patient  feel that his/her medications are working for him/her?  yes  Has the patient been experiencing any side effects to the medications prescribed?  no  Does the patient measure his/her own blood pressure or blood glucose at home?  Yes; 134/23mmHg; HR 47   Does the patient have any problems obtaining medications due to transportation or finances?   no  Understanding of regimen: good Understanding of indications: good Potential of compliance: good    Pharmacist comments: Patient is doing well on therapy. Reports his heart rate is in the 50s most time but drops as low as 30 fairly often. Counseled patient that a HR that low is dangerous and to monitor frequently until his appointment. Instructed him to call cardiologist if he gets more low readings (less than ~55) and to see urgent care if he gets lightheaded, dizzy, confused, or feeling faint. Patient voiced understanding.    Tyler Williams, Sherian Rein D PGY1 Pharmacy Resident  12/04/2017      5:40 PM

## 2017-12-06 ENCOUNTER — Ambulatory Visit (INDEPENDENT_AMBULATORY_CARE_PROVIDER_SITE_OTHER): Payer: Medicare Other | Admitting: Podiatry

## 2017-12-06 DIAGNOSIS — B351 Tinea unguium: Secondary | ICD-10-CM | POA: Diagnosis not present

## 2017-12-06 DIAGNOSIS — Z9229 Personal history of other drug therapy: Secondary | ICD-10-CM

## 2017-12-06 DIAGNOSIS — M79674 Pain in right toe(s): Secondary | ICD-10-CM

## 2017-12-06 DIAGNOSIS — R600 Localized edema: Secondary | ICD-10-CM

## 2017-12-06 DIAGNOSIS — M79675 Pain in left toe(s): Secondary | ICD-10-CM | POA: Diagnosis not present

## 2017-12-07 ENCOUNTER — Ambulatory Visit (INDEPENDENT_AMBULATORY_CARE_PROVIDER_SITE_OTHER): Payer: Medicare Other | Admitting: *Deleted

## 2017-12-07 DIAGNOSIS — I4821 Permanent atrial fibrillation: Secondary | ICD-10-CM

## 2017-12-07 DIAGNOSIS — I4891 Unspecified atrial fibrillation: Secondary | ICD-10-CM | POA: Diagnosis not present

## 2017-12-07 DIAGNOSIS — Z5181 Encounter for therapeutic drug level monitoring: Secondary | ICD-10-CM

## 2017-12-07 LAB — POCT INR: INR: 3 (ref 2.0–3.0)

## 2017-12-07 NOTE — Patient Instructions (Signed)
Description   Continue taking 1 tablet every day except 1.5 tablets on Mondays.  Recheck INR in 4 weeks.  Call if placed on any new medications or if scheduled for any procedures.  Coumadin clinic #[phone].       

## 2017-12-10 NOTE — Progress Notes (Signed)
Daisean Brodhead 84 y.o. male DOB [birth year] MRN [id number]       Nutrition  No diagnosis found. Past Medical History:  Diagnosis Date  . A-fib ([place])   . AAA (abdominal aortic aneurysm) ([place])    s/p repair  . Aortic stenosis, mild    a. mean gradient 17 mmHg on 04/2012 TTE  . Arthritis    knees  . CAD (coronary artery disease)    a. s/p multiple percutaneous prior coronary artery interventions b. 05/2012: unsuccessful PCI to tight RCA->medically managed  . Cancer [facility])    skin cancer removed, right hand, left leg, chest  . Carotid artery disease ([place])    0-39% bilateral ICA stenoses 11/2011  . Chronic anticoagulation    Coumadin  . Coronary artery disease   . GERD (gastroesophageal reflux disease)   . GERD (gastroesophageal reflux disease)   . History of pulmonary embolism    1964 related to dislocated hip on right   . History of skin cancer   . Hypercholesteremia   . Hypertension   . Incidental cecal mass noted on CT imaging 05/24/2016   **An incidental finding of potential clinical significance has been found. 4.6 x 5.6 x 3.7 cm masslike lesion in the cecum adjacent to the ileocecal valve may represent a large polyp or colonic neoplasm. Correlation with nonemergent colonoscopy is strongly recommended in the near future to better evaluate this finding.**  . Macular degeneration    eye injections  . Microscopic hematuria    Followed by urology  . Permanent atrial fibrillation ([place])   . PVD (peripheral vascular disease) ([place])    Bilateral renal artery atherosclerosis, SMA stenosis with collateralization  . RBBB    Chronic  . 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 SSS (sick sinus syndrome) ([place])    Meds reviewed.     Current Outpatient Medications (Cardiovascular):  .  amLODipine (NORVASC) 10 MG tablet, Take 10 mg by mouth daily. Marland Kitchen   atorvastatin (LIPITOR) 20 MG tablet, Take 20 mg by mouth every evening. .  furosemide (LASIX) 40 MG tablet, Take 40 mg by mouth daily. .  isosorbide mononitrate (IMDUR) 60 MG 24 hr tablet, Take 1 tablet (60 mg total) by mouth daily. .  metoprolol succinate (TOPROL-XL) 25 MG 24 hr tablet, Take 1.5 tablets (37.5 mg total) by mouth daily. .  nitroGLYCERIN (NITROSTAT) 0.4 MG SL tablet, Place 0.4 mg under the tongue every 5 (five) minutes as needed for chest pain. Marland Kitchen  omega-3 acid ethyl esters (LOVAZA) 1 G capsule, Take 1 g by mouth daily. .  ramipril (ALTACE) 10 MG capsule, Take 10 mg by mouth 2 (two) times daily.   Current Outpatient Medications (Analgesics):  .  aspirin EC 81 MG tablet, Take 81 mg by mouth daily.  Current Outpatient Medications (Hematological):  .  warfarin (COUMADIN) 5 MG tablet, Take 5-7.5 mg by mouth See admin instructions. Take 1 and 1/2 tablets on Monday then take 1 tablet all the other days  Current Outpatient Medications (Other):  Marland Kitchen  Calcium-Magnesium-Zinc (CAL-MAG-ZINC PO), Take 1 tablet by mouth daily. .  Cholecalciferol (VITAMIN D3) 5000 units TABS, Take 1 tablet by mouth daily. .  Coenzyme Q10 (COQ-10) 10 MG CAPS, Take 10 mg by mouth daily. .  fesoterodine (TOVIAZ) 8 MG TB24 tablet, Take 8 mg by mouth every evening. Marland Kitchen  LORazepam (ATIVAN) 1 MG tablet, Take 1 mg  by mouth at bedtime. .  multivitamin-lutein (OCUVITE-LUTEIN) CAPS capsule, Take 1 capsule by mouth daily. .  Omega-3 Fatty Acids (FISH OIL PO), Take 1 tablet by mouth daily. .  ranibizumab (LUCENTIS) 0.5 MG/0.05ML SOLN, 0.5 mg by Intravitreal route See admin instructions. Every 9 weeks .  solifenacin (VESICARE) 10 MG tablet, Take 10 mg by mouth daily.   HT: Ht Readings from Last 1 Encounters:  09/25/17 5\' 8"  (1.727 m)    WT: Wt Readings from Last 5 Encounters:  09/25/17 186 lb (84.4 kg)  09/01/17 177 lb 4 oz (80.4 kg)  06/20/17 190 lb (86.2 kg)  01/31/17 184 lb 1.9 oz (83.5 kg)  01/18/17 186 lb 1.9  oz (84.4 kg)     BMI = 28.29  Current tobacco use? No       Labs:  Lipid Panel     Component Value Date/Time   CHOL 149 08/28/2017 0350   TRIG 95 08/28/2017 0350   HDL 52 08/28/2017 0350   CHOLHDL 2.9 08/28/2017 0350   VLDL 19 08/28/2017 0350   LDLCALC 78 08/28/2017 0350    Lab Results  Component Value Date   HGBA1C 5.0 06/09/2016   CBG (last 3)  No results for input(s): GLUCAP in the last 72 hours.  Nutrition Diagnosis ? Food-and nutrition-related knowledge deficit related to lack of exposure to information as related to diagnosis of: ? CVD  ? Overweight  related to excessive energy intake as evidenced by a BMI 28.39  Nutrition Goal(s):  ? To be determined  Plan:  Pt to attend nutrition classes ? Nutrition I ? Nutrition II ? Portion Distortion  ? Diabetes Blitz ? Diabetes Q & A Will provide client-centered nutrition education as part of interdisciplinary care.   Monitor and evaluate progress toward nutrition goal with team.  Laurina Bustle, MS, RD, LDN 12/10/2017 8:39 AM

## 2017-12-13 ENCOUNTER — Encounter (HOSPITAL_COMMUNITY)
Admission: RE | Admit: 2017-12-13 | Discharge: 2017-12-13 | Disposition: A | Discharge status: Home / Self Care | Payer: Medicare Other | Source: Ambulatory Visit | Attending: Cardiovascular Disease | Admitting: Cardiovascular Disease

## 2017-12-13 ENCOUNTER — Encounter (HOSPITAL_COMMUNITY): Payer: Self-pay

## 2017-12-13 VITALS — Ht 68.0 in | Wt 196.0 lb

## 2017-12-13 DIAGNOSIS — H35363 Drusen (degenerative) of macula, bilateral: Secondary | ICD-10-CM | POA: Diagnosis not present

## 2017-12-13 DIAGNOSIS — Z9861 Coronary angioplasty status: Secondary | ICD-10-CM | POA: Insufficient documentation

## 2017-12-13 DIAGNOSIS — H353211 Exudative age-related macular degeneration, right eye, with active choroidal neovascularization: Secondary | ICD-10-CM | POA: Diagnosis not present

## 2017-12-13 DIAGNOSIS — H353221 Exudative age-related macular degeneration, left eye, with active choroidal neovascularization: Secondary | ICD-10-CM | POA: Diagnosis not present

## 2017-12-13 DIAGNOSIS — H353231 Exudative age-related macular degeneration, bilateral, with active choroidal neovascularization: Secondary | ICD-10-CM | POA: Diagnosis not present

## 2017-12-13 NOTE — Progress Notes (Addendum)
Cardiac Individual Treatment Plan  Patient Details  Name: Tyler Williams MRN: [id number] Date of Birth: November 20, 1926 Referring Provider:   Flowsheet Row CARDIAC REHAB PHASE II ORIENTATION from 12/13/2017 in [facility]  Referring Provider  Dr. Burt Knack       Initial Encounter Date:  Flowsheet Row CARDIAC REHAB PHASE II ORIENTATION from 12/13/2017 in [facility]  Date  12/12/17      Visit Diagnosis: 08/31/2017 S/P PTCA (percutaneous transluminal coronary angioplasty)  Patient's Home Medications on Admission:  Current Outpatient Medications:  .  amLODipine (NORVASC) 10 MG tablet, Take 10 mg by mouth daily., Disp: , Rfl:  .  aspirin EC 81 MG tablet, Take 81 mg by mouth daily., Disp: , Rfl:  .  atorvastatin (LIPITOR) 20 MG tablet, Take 20 mg by mouth every evening., Disp: , Rfl:  .  Calcium-Magnesium-Zinc (CAL-MAG-ZINC PO), Take 1 tablet by mouth daily., Disp: , Rfl:  .  Cholecalciferol (VITAMIN D3) 5000 units TABS, Take 1 tablet by mouth daily., Disp: , Rfl:  .  Coenzyme Q10 (COQ-10) 10 MG CAPS, Take 10 mg by mouth daily., Disp: , Rfl:  .  fesoterodine (TOVIAZ) 8 MG TB24 tablet, Take 8 mg by mouth every evening., Disp: , Rfl:  .  furosemide (LASIX) 40 MG tablet, Take 40 mg by mouth daily., Disp: , Rfl:  .  isosorbide mononitrate (IMDUR) 60 MG 24 hr tablet, Take 1 tablet (60 mg total) by mouth daily., Disp: 90 tablet, Rfl: 3 .  LORazepam (ATIVAN) 1 MG tablet, Take 1 mg by mouth at bedtime., Disp: , Rfl:  .  metoprolol succinate (TOPROL-XL) 25 MG 24 hr tablet, Take 1.5 tablets (37.5 mg total) by mouth daily., Disp: 135 tablet, Rfl: 3 .  multivitamin-lutein (OCUVITE-LUTEIN) CAPS capsule, Take 1 capsule by mouth daily., Disp: , Rfl:  .  nitroGLYCERIN (NITROSTAT) 0.4 MG SL tablet, Place 0.4 mg under the tongue every 5 (five) minutes as needed for chest pain., Disp: , Rfl:  .  omega-3 acid ethyl esters (LOVAZA) 1 G capsule, Take  1 g by mouth daily., Disp: , Rfl:  .  Omega-3 Fatty Acids (FISH OIL PO), Take 1 tablet by mouth daily., Disp: , Rfl:  .  ramipril (ALTACE) 10 MG capsule, Take 10 mg by mouth 2 (two) times daily., Disp: , Rfl:  .  ranibizumab (LUCENTIS) 0.5 MG/0.05ML SOLN, 0.5 mg by Intravitreal route See admin instructions. Every 9 weeks, Disp: , Rfl:  .  solifenacin (VESICARE) 10 MG tablet, Take 10 mg by mouth daily., Disp: , Rfl:  .  warfarin (COUMADIN) 5 MG tablet, Take 5-7.5 mg by mouth See admin instructions. Take 1 and 1/2 tablets on Monday then take 1 tablet all the other days, Disp: , Rfl:   Past Medical History: Past Medical History:  Diagnosis Date  . A-fib ([place])   . AAA (abdominal aortic aneurysm) ([place])    s/p repair  . Aortic stenosis, mild    a. mean gradient 17 mmHg on 04/2012 TTE  . Arthritis    knees  . CAD (coronary artery disease)    a. s/p multiple percutaneous prior coronary artery interventions b. 05/2012: unsuccessful PCI to tight RCA->medically managed  . Cancer [facility])    skin cancer removed, right hand, left leg, chest  . Carotid artery disease ([place])    0-39% bilateral ICA stenoses 11/2011  . Chronic anticoagulation    Coumadin  . Coronary artery disease   . GERD (  gastroesophageal reflux disease)   . GERD (gastroesophageal reflux disease)   . History of pulmonary embolism    1964 related to dislocated hip on right   . History of skin cancer   . Hypercholesteremia   . Hypertension   . Incidental cecal mass noted on CT imaging 05/24/2016   **An incidental finding of potential clinical significance has been found. 4.6 x 5.6 x 3.7 cm masslike lesion in the cecum adjacent to the ileocecal valve may represent a large polyp or colonic neoplasm. Correlation with nonemergent colonoscopy is strongly recommended in the near future to better evaluate this finding.**  . Macular degeneration    eye injections  . Microscopic hematuria    Followed by urology  . Permanent atrial fibrillation    . PVD (peripheral vascular disease) ([place])    Bilateral renal artery atherosclerosis, SMA stenosis with collateralization  . RBBB    Chronic  . 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 SSS (sick sinus syndrome) ([facility])     Tobacco Use: Social History   Tobacco Use  Smoking Status Never Smoker  Smokeless Tobacco Never Used    Labs: Recent Review Flowsheet Data    Labs for ITP Cardiac and Pulmonary Rehab Latest Ref Rng & Units 06/13/2016 06/13/2016 06/13/2016 06/13/2016 08/28/2017   Cholestrol 0 - 200 mg/dL - - - - 149   LDLCALC 0 - 99 mg/dL - - - - 78   HDL >40 mg/dL - - - - 52   Trlycerides <150 mg/dL - - - - 95   Hemoglobin A1c 4.8 - 5.6 % - - - - -   PHART 7.350 - 7.450 - - - 7.347(L) -   PCO2ART 32.0 - 48.0 mmHg - - - 40.6 -   HCO3 20.0 - 28.0 mmol/L - - - 22.5 -   TCO2 0 - 100 mmol/L 27 25 25 24  -   ACIDBASEDEF 0.0 - 2.0 mmol/L - - - 3.0(H) -   O2SAT % - - - 95.0 -      Capillary Blood Glucose: No results found for: GLUCAP   Exercise Target Goals: Exercise Program Goal: Individual exercise prescription set using results from initial 6 min walk test and THRR while considering  patient's activity barriers and safety.   Exercise Prescription Goal: Initial exercise prescription builds to 30-45 minutes a day of aerobic activity, 2-3 days per week.  Home exercise guidelines will be given to patient during program as part of exercise prescription that the participant will acknowledge.  Activity Barriers & Risk Stratification: Activity Barriers & Cardiac Risk Stratification - 12/13/17 1037    Activity Barriers & Cardiac Risk Stratification          Activity Barriers  Deconditioning    Cardiac Risk Stratification  High           6 Minute Walk: 6 Minute Walk    6 Minute Walk    Row Name 12/13/17 1036   Phase  Initial   Distance  1105 feet   Walk  Time  6 minutes   # of Rest Breaks  0   MPH  2.09   METS  1.17   RPE  12   VO2 Peak  4.09   Symptoms  No   Resting HR  56 bpm   Resting BP  124/60   Resting Oxygen Saturation   95 %   Exercise  Oxygen Saturation  during 6 min walk  94 %   Max Ex. HR  71 bpm   Max Ex. BP  140/80   2 Minute Post BP  126/53          Oxygen Initial Assessment:   Oxygen Re-Evaluation:   Oxygen Discharge (Final Oxygen Re-Evaluation):   Initial Exercise Prescription: Initial Exercise Prescription - 12/13/17 1100    Date of Initial Exercise RX and Referring Provider          Date  12/12/17    Referring Provider  Dr. Burt Knack     Expected Discharge Date  03/22/18        NuStep          Level  2    SPM  75    Minutes  10    METs  2        Arm Ergometer          Level  2    Watts  20    Minutes  10    METs  2.1        Track          Laps  7    Minutes  10    METs  2.23        Prescription Details          Frequency (times per week)  3    Duration  Progress to 30 minutes of continuous aerobic without signs/symptoms of physical distress        Intensity          THRR 40-80% of Max Heartrate  52-104    Ratings of Perceived Exertion  11-13        Progression          Progression  Continue to progress workloads to maintain intensity without signs/symptoms of physical distress.        Resistance Training          Training Prescription  Yes    Weight  2 lbs    Reps  10-15           Perform Capillary Blood Glucose checks as needed.  Exercise Prescription Changes:   Exercise Comments:   Exercise Goals and Review: Exercise Goals    Exercise Goals    Row Name 12/13/17 1038   Increase Physical Activity  Yes   Intervention  Develop an individualized exercise prescription for aerobic and resistive training based on initial evaluation findings, risk stratification, comorbidities and participant's personal goals.;Provide advice, education, support and counseling  about physical activity/exercise needs.   Expected Outcomes  Short Term: Attend rehab on a regular basis to increase amount of physical activity.   Increase Strength and Stamina  Yes   Intervention  Provide advice, education, support and counseling about physical activity/exercise needs.;Develop an individualized exercise prescription for aerobic and resistive training based on initial evaluation findings, risk stratification, comorbidities and participant's personal goals.   Expected Outcomes  Short Term: Increase workloads from initial exercise prescription for resistance, speed, and METs.   Able to understand and use rate of perceived exertion (RPE) scale  Yes   Intervention  Provide education and explanation on how to use RPE scale   Expected Outcomes  Short Term: Able to use RPE daily in rehab to express subjective intensity level;Long Term:  Able to use RPE to guide intensity level when exercising independently   Knowledge and understanding of Target Heart Rate Range (THRR)  Yes  Intervention  Provide education and explanation of THRR including how the numbers were predicted and where they are located for reference   Expected Outcomes  Short Term: Able to state/look up THRR;Long Term: Able to use THRR to govern intensity when exercising independently;Short Term: Able to use daily as guideline for intensity in rehab   Able to check pulse independently  Yes   Intervention  Provide education and demonstration on how to check pulse in carotid and radial arteries.;Review the importance of being able to check your own pulse for safety during independent exercise   Expected Outcomes  Short Term: Able to explain why pulse checking is important during independent exercise;Long Term: Able to check pulse independently and accurately   Understanding of Exercise Prescription  Yes   Intervention  Provide education, explanation, and written materials on patient's individual exercise prescription   Expected  Outcomes  Short Term: Able to explain program exercise prescription;Long Term: Able to explain home exercise prescription to exercise independently          Exercise Goals Re-Evaluation :   Discharge Exercise Prescription (Final Exercise Prescription Changes):   Nutrition:  Target Goals: Understanding of nutrition guidelines, daily intake of sodium 1500mg , cholesterol 200mg , calories 30% from fat and 7% or less from saturated fats, daily to have 5 or more servings of fruits and vegetables.  Biometrics: Pre Biometrics - 12/13/17 1038    Pre Biometrics          Height  5\' 8"  (1.727 m)    Weight  88.9 kg    Waist Circumference  41 inches    Hip Circumference  40 inches    Waist to Hip Ratio  1.02 %    BMI (Calculated)  29.81    Triceps Skinfold  19 mm    % Body Fat  30.5 %    Grip Strength  30 kg    Flexibility  7 in    Single Leg Stand  0 seconds            Nutrition Therapy Plan and Nutrition Goals: Nutrition Therapy & Goals - 12/13/17 0947    Nutrition Therapy          Diet  heart healthy        Personal Nutrition Goals          Nutrition Goal  Pt to identify and limit food sources of saturated fat, trans fat, refined carbohydrates and sodium    Personal Goal #2  Pt to eat a variety of non-starchy vegetables    Personal Goal #3  Pt to make better choices when eating out    Personal Goal #4  Pt to decrease intake of fried foods        Intervention Plan          Intervention  Prescribe, educate and counsel regarding individualized specific dietary modifications aiming towards targeted core components such as weight, hypertension, lipid management, diabetes, heart failure and other comorbidities.    Expected Outcomes  Short Term Goal: Understand basic principles of dietary content, such as calories, fat, sodium, cholesterol and nutrients.;Long Term Goal: Adherence to prescribed nutrition plan.           Nutrition Assessments: Nutrition Assessments -  12/13/17 0949    MEDFICTS Scores          Pre Score  69           Nutrition Goals Re-Evaluation:   Nutrition Goals Re-Evaluation:   Nutrition Goals Discharge (Final Nutrition  Goals Re-Evaluation):   Psychosocial: Target Goals: Acknowledge presence or absence of significant depression and/or stress, maximize coping skills, provide positive support system. Participant is able to verbalize types and ability to use techniques and skills needed for reducing stress and depression.  Initial Review & Psychosocial Screening: Initial Psych Review & Screening - 12/13/17 1122    Initial Review          Current issues with  None Identified        Family Dynamics          Good Support System?  Yes   family        Barriers          Psychosocial barriers to participate in program  There are no identifiable barriers or psychosocial needs.        Screening Interventions          Interventions  Encouraged to exercise           Quality of Life Scores: Quality of Life - 12/13/17 1048    Quality of Life          Select  Quality of Life        Quality of Life Scores          Health/Function Pre  27.6 %    Socioeconomic Pre  30 %    Psych/Spiritual Pre  29.14 %    Family Pre  28.8 %    GLOBAL Pre  28.63 %          Scores of 19 and below usually indicate a poorer quality of life in these areas.  A difference of  2-3 points is a clinically meaningful difference.  A difference of 2-3 points in the total score of the Quality of Life Index has been associated with significant improvement in overall quality of life, self-image, physical symptoms, and general health in studies assessing change in quality of life.  PHQ-9: Recent Review Flowsheet Data    There is no flowsheet data to display.     Interpretation of Total Score  Total Score Depression Severity:  1-4 = Minimal depression, 5-9 = Mild depression, 10-14 = Moderate depression, 15-19 = Moderately severe depression,  20-27 = Severe depression   Psychosocial Evaluation and Intervention:   Psychosocial Re-Evaluation:   Psychosocial Discharge (Final Psychosocial Re-Evaluation):   Vocational Rehabilitation: Provide vocational rehab assistance to qualifying candidates.   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 Participant will state understanding/return demonstration of topics presented.  Learning Barriers/Preferences: Learning Barriers/Preferences - 12/12/17 1521    Learning Barriers/Preferences          Learning Barriers  Sight   macular degeneration   Learning Preferences  Written Material;Verbal Instruction           Education Topics: Count Your Pulse:  -Group instruction provided by verbal instruction, demonstration, patient participation and written materials to support subject.  Instructors address importance of being able to find your pulse and how to count your pulse when at home without a heart monitor.  Patients get hands on experience counting their pulse with staff help and individually.   Heart Attack, Angina, and Risk  Factor Modification:  -Group instruction provided by verbal instruction, video, and written materials to support subject.  Instructors address signs and symptoms of angina and heart attacks.    Also discuss risk factors for heart disease and how to make changes to improve heart health risk factors.   Functional Fitness:  -Group instruction provided by verbal instruction, demonstration, patient participation, and written materials to support subject.  Instructors address safety measures for doing things around the house.  Discuss how to get up and down off the  floor, how to pick things up properly, how to safely get out of a chair without assistance, and balance training.   Meditation and Mindfulness:  -Group instruction provided by verbal instruction, patient participation, and written materials to support subject.  Instructor addresses importance of mindfulness and meditation practice to help reduce stress and improve awareness.  Instructor also leads participants through a meditation exercise.    Stretching for Flexibility and Mobility:  -Group instruction provided by verbal instruction, patient participation, and written materials to support subject.  Instructors lead participants through series of stretches that are designed to increase flexibility thus improving mobility.  These stretches are additional exercise for major muscle groups that are typically performed during regular warm up and cool down.   Hands Only CPR:  -Group verbal, video, and participation provides a basic overview of AHA guidelines for community CPR. Role-play of emergencies allow participants the opportunity to practice calling for help and chest compression technique with discussion of AED use.   Hypertension: -Group verbal and written instruction that provides a basic overview of hypertension including the most recent diagnostic guidelines, risk factor reduction with self-care instructions and medication management.    Nutrition I class: Heart Healthy Eating:  -Group instruction provided by PowerPoint slides, verbal discussion, and written materials to support subject matter. The instructor gives an explanation and review of the Therapeutic Lifestyle Changes diet recommendations, which includes a discussion on lipid goals, dietary fat, sodium, fiber, plant stanol/sterol esters, sugar, and the components of a well-balanced, healthy diet.   Nutrition II class: Lifestyle Skills:  -Group instruction provided by PowerPoint slides, verbal discussion, and written materials  to support subject matter. The instructor gives an explanation and review of label reading, grocery shopping for heart health, heart healthy recipe modifications, and ways to make healthier choices when eating out.   Diabetes Question & Answer:  -Group instruction provided by PowerPoint slides, verbal discussion, and written materials to support subject matter. The instructor gives an explanation and review of diabetes co-morbidities, pre- and post-prandial blood glucose goals, pre-exercise blood glucose goals, signs, symptoms, and treatment of hypoglycemia and hyperglycemia, and foot care basics.   Diabetes Blitz:  -Group instruction provided by PowerPoint slides, verbal discussion, and written materials to support subject matter. The instructor gives an explanation and review of the physiology behind type 1 and type 2 diabetes, diabetes medications and rational behind using different medications, pre- and post-prandial blood glucose recommendations and Hemoglobin A1c goals, diabetes diet, and exercise including blood glucose guidelines for exercising safely.    Portion Distortion:  -Group instruction provided by PowerPoint slides, verbal discussion, written materials, and food models to support subject matter. The instructor gives an explanation of serving size versus portion size, changes in portions sizes over the last 20 years, and what consists of a serving from each food group.   Stress Management:  -Group instruction provided by verbal instruction, video, and written materials to support subject matter.  Instructors review role of stress  in heart disease and how to cope with stress positively.     Exercising on Your Own:  -Group instruction provided by verbal instruction, power point, and written materials to support subject.  Instructors discuss benefits of exercise, components of exercise, frequency and intensity of exercise, and end points for exercise.  Also discuss use of  nitroglycerin and activating EMS.  Review options of places to exercise outside of rehab.  Review guidelines for sex with heart disease.   Cardiac Drugs I:  -Group instruction provided by verbal instruction and written materials to support subject.  Instructor reviews cardiac drug classes: antiplatelets, anticoagulants, beta blockers, and statins.  Instructor discusses reasons, side effects, and lifestyle considerations for each drug class.   Cardiac Drugs II:  -Group instruction provided by verbal instruction and written materials to support subject.  Instructor reviews cardiac drug classes: angiotensin converting enzyme inhibitors (ACE-I), angiotensin II receptor blockers (ARBs), nitrates, and calcium channel blockers.  Instructor discusses reasons, side effects, and lifestyle considerations for each drug class.   Anatomy and Physiology of the Circulatory System:  Group verbal and written instruction and models provide basic cardiac anatomy and physiology, with the coronary electrical and arterial systems. Review of: AMI, Angina, Valve disease, Heart Failure, Peripheral Artery Disease, Cardiac Arrhythmia, Pacemakers, and the ICD.   Other Education:  -Group or individual verbal, written, or video instructions that support the educational goals of the cardiac rehab program.   Holiday Eating Survival Tips:  -Group instruction provided by PowerPoint slides, verbal discussion, and written materials to support subject matter. The instructor gives patients tips, tricks, and techniques to help them not only survive but enjoy the holidays despite the onslaught of food that accompanies the holidays.   Knowledge Questionnaire Score: Knowledge Questionnaire Score - 12/13/17 1039    Knowledge Questionnaire Score          Pre Score  14/24           Core Components/Risk Factors/Patient Goals at Admission: Personal Goals and Risk Factors at Admission - 12/13/17 1053    Core Components/Risk  Factors/Patient Goals on Admission           Weight Management  Yes;Weight Maintenance;Weight Loss    Admit Weight  195 lb 15.8 oz (88.9 kg)    Expected Outcomes  Short Term: Continue to assess and modify interventions until short term weight is achieved;Long Term: Adherence to nutrition and physical activity/exercise program aimed toward attainment of established weight goal;Weight Maintenance: Understanding of the daily nutrition guidelines, which includes 25-35% calories from fat, 7% or less cal from saturated fats, less than 200mg  cholesterol, less than 1.5gm of sodium, & 5 or more servings of fruits and vegetables daily;Weight Loss: Understanding of general recommendations for a balanced deficit meal plan, which promotes 1-2 lb weight loss per week and includes a negative energy balance of [phone] kcal/d;Understanding recommendations for meals to include 15-35% energy as protein, 25-35% energy from fat, 35-60% energy from carbohydrates, less than 200mg  of dietary cholesterol, 20-35 gm of total fiber daily;Understanding of distribution of calorie intake throughout the day with the consumption of 4-5 meals/snacks    Hypertension  Yes    Intervention  Provide education on lifestyle modifcations including regular physical activity/exercise, weight management, moderate sodium restriction and increased consumption of fresh fruit, vegetables, and low fat dairy, alcohol moderation, and smoking cessation.;Monitor prescription use compliance.    Expected Outcomes  Short Term: Continued assessment and intervention until BP is < 140/22mm HG in hypertensive participants. < 130/60mm  HG in hypertensive participants with diabetes, heart failure or chronic kidney disease.;Long Term: Maintenance of blood pressure at goal levels.    Intervention  Provide education and support for participant on nutrition & aerobic/resistive exercise along with prescribed medications to achieve LDL 70mg , HDL >40mg .    Expected Outcomes   Short Term: Participant states understanding of desired cholesterol values and is compliant with medications prescribed. Participant is following exercise prescription and nutrition guidelines.;Long Term: Cholesterol controlled with medications as prescribed, with individualized exercise RX and with personalized nutrition plan. Value goals: LDL < 70mg , HDL > 40 mg.           Core Components/Risk Factors/Patient Goals Review:    Core Components/Risk Factors/Patient Goals at Discharge (Final Review):    ITP Comments: ITP Comments    Row Name 12/12/17 1519 12/13/17 0907   ITP Comments  Dr.Bess Saltzman Radford Pax, Medical Director   Dr.Jannet Calip, Medical Director       Comments: Patient attended orientation from [phone] to [phone]  to review rules and guidelines for program. Completed 6 minute walk test, Intitial ITP, and exercise prescription.  VSS. Telemetry-atrial flutter, chronic.  Asymptomatic.  Pt with known macular degeneration. Discussed benefits of home rollator with pt and his son.  Pt agreeable to using assistive device at rehab and receptive of getting rollator for home use.  Andi Hence, RN, BSN Cardiac Pulmonary Rehab 12/13/17  11:28 AM

## 2017-12-13 NOTE — Progress Notes (Signed)
Tyler Williams 84 y.o. male DOB: [birth year] MRN: [id number]      Nutrition Note  1. Status post coronary artery stent placement    Past Medical History:  Diagnosis Date  . A-fib ([place])   . AAA (abdominal aortic aneurysm) ([place])    s/p repair  . Aortic stenosis, mild    a. mean gradient 17 mmHg on 04/2012 TTE  . Arthritis    knees  . CAD (coronary artery disease)    a. s/p multiple percutaneous prior coronary artery interventions b. 05/2012: unsuccessful PCI to tight RCA->medically managed  . Cancer [facility])    skin cancer removed, right hand, left leg, chest  . Carotid artery disease ([place])    0-39% bilateral ICA stenoses 11/2011  . Chronic anticoagulation    Coumadin  . Coronary artery disease   . GERD (gastroesophageal reflux disease)   . GERD (gastroesophageal reflux disease)   . History of pulmonary embolism    1964 related to dislocated hip on right   . History of skin cancer   . Hypercholesteremia   . Hypertension   . Incidental cecal mass noted on CT imaging 05/24/2016   **An incidental finding of potential clinical significance has been found. 4.6 x 5.6 x 3.7 cm masslike lesion in the cecum adjacent to the ileocecal valve may represent a large polyp or colonic neoplasm. Correlation with nonemergent colonoscopy is strongly recommended in the near future to better evaluate this finding.**  . Macular degeneration    eye injections  . Microscopic hematuria    Followed by urology  . Permanent atrial fibrillation ([place])   . PVD (peripheral vascular disease) ([place])    Bilateral renal artery atherosclerosis, SMA stenosis with collateralization  . RBBB    Chronic  . 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 SSS (sick sinus syndrome) ([place])    Meds reviewed.    Current Outpatient Medications (Cardiovascular):  .  amLODipine (NORVASC) 10 MG tablet, Take 10  mg by mouth daily. Marland Kitchen  atorvastatin (LIPITOR) 20 MG tablet, Take 20 mg by mouth every evening. .  furosemide (LASIX) 40 MG tablet, Take 40 mg by mouth daily. .  isosorbide mononitrate (IMDUR) 60 MG 24 hr tablet, Take 1 tablet (60 mg total) by mouth daily. .  metoprolol succinate (TOPROL-XL) 25 MG 24 hr tablet, Take 1.5 tablets (37.5 mg total) by mouth daily. .  nitroGLYCERIN (NITROSTAT) 0.4 MG SL tablet, Place 0.4 mg under the tongue every 5 (five) minutes as needed for chest pain. Marland Kitchen  omega-3 acid ethyl esters (LOVAZA) 1 G capsule, Take 1 g by mouth daily. .  ramipril (ALTACE) 10 MG capsule, Take 10 mg by mouth 2 (two) times daily.   Current Outpatient Medications (Analgesics):  .  aspirin EC 81 MG tablet, Take 81 mg by mouth daily.  Current Outpatient Medications (Hematological):  .  warfarin (COUMADIN) 5 MG tablet, Take 5-7.5 mg by mouth See admin instructions. Take 1 and 1/2 tablets on Monday then take 1 tablet all the other days  Current Outpatient Medications (Other):  Marland Kitchen  Calcium-Magnesium-Zinc (CAL-MAG-ZINC PO), Take 1 tablet by mouth daily. .  Cholecalciferol (VITAMIN D3) 5000 units TABS, Take 1 tablet by mouth daily. .  Coenzyme Q10 (COQ-10) 10 MG CAPS, Take 10 mg by mouth daily. .  fesoterodine (TOVIAZ) 8 MG TB24 tablet, Take 8 mg by mouth every evening. Marland Kitchen  LORazepam (ATIVAN)  1 MG tablet, Take 1 mg by mouth at bedtime. .  multivitamin-lutein (OCUVITE-LUTEIN) CAPS capsule, Take 1 capsule by mouth daily. .  Omega-3 Fatty Acids (FISH OIL PO), Take 1 tablet by mouth daily. .  ranibizumab (LUCENTIS) 0.5 MG/0.05ML SOLN, 0.5 mg by Intravitreal route See admin instructions. Every 9 weeks .  solifenacin (VESICARE) 10 MG tablet, Take 10 mg by mouth daily.   HT: Ht Readings from Last 1 Encounters:  09/25/17 5\' 8"  (1.727 m)    WT: Wt Readings from Last 5 Encounters:  09/25/17 186 lb (84.4 kg)  09/01/17 177 lb 4 oz (80.4 kg)  06/20/17 190 lb (86.2 kg)  01/31/17 184 lb 1.9 oz (83.5 kg)   01/18/17 186 lb 1.9 oz (84.4 kg)     BMI = 28.29 (09/25/17)  Current tobacco use? No  Labs:  Lipid Panel     Component Value Date/Time   CHOL 149 08/28/2017 0350   TRIG 95 08/28/2017 0350   HDL 52 08/28/2017 0350   CHOLHDL 2.9 08/28/2017 0350   VLDL 19 08/28/2017 0350   LDLCALC 78 08/28/2017 0350    Lab Results  Component Value Date   HGBA1C 5.0 06/09/2016   CBG (last 3)  No results for input(s): GLUCAP in the last 72 hours.  Nutrition Note Spoke with pt. Nutrition plan and goals reviewed with pt. Pt is following Step 1 of the Therapeutic Lifestyle Changes diet. Pt wants to lose wt. Pt has not actively been trying to lose wt. Wt loss tips reviewed (label reading, how to build a healthy plate, portion sizes, eating frequently across the day).   Pt with dx of CHF. Per discussion, pt does use canned/convenience foods often. Pt does add salt to food. Pt does eat out frequently. Reviewed the role of sodium in heart disease with patient. Pt expressed understanding of the information reviewed. Pt aware of nutrition education classes offered and would like to attend nutrition classes.  Nutrition Diagnosis ? Food-and nutrition-related knowledge deficit related to lack of exposure to information as related to diagnosis of: ? CVD ? CHF ? Overweight  related to excessive energy intake as evidenced by a BMI of 28.29 ? Excessive sodium intake related to over consumption of processed food as evidenced by frequent consumption of convenience food/ canned vegetables and eating out frequently.  Nutrition Intervention ? Pt's individual nutrition plan and goals reviewed with pt. ? Pt given handouts for: ? Nutrition I class ? Nutrition II class   Nutrition Goal(s):   ? Pt to identify and limit food sources of saturated fat, trans fat, refined carbohydrates and sodium ? Pt to eat a variety of non-starchy vegetables ? Pt to make better choices when eating out ? Pt to decrease intake of fried  foods  Plan:  ? Pt to attend nutrition classes ? Nutrition I ? Nutrition II ? Portion Distortion  ? Will provide client-centered nutrition education as part of interdisciplinary care ? Monitor and evaluate progress toward nutrition goal with team.   Laurina Bustle, MS, RD, LDN 12/13/2017 9:40 AM

## 2017-12-17 ENCOUNTER — Ambulatory Visit (HOSPITAL_COMMUNITY): Payer: Medicare Other

## 2017-12-17 ENCOUNTER — Encounter (HOSPITAL_COMMUNITY): Payer: Self-pay

## 2017-12-17 ENCOUNTER — Encounter (HOSPITAL_COMMUNITY)
Admission: RE | Admit: 2017-12-17 | Discharge: 2017-12-17 | Disposition: A | Discharge status: Home / Self Care | Payer: Medicare Other | Source: Ambulatory Visit | Attending: Cardiovascular Disease | Admitting: Cardiovascular Disease

## 2017-12-17 DIAGNOSIS — Z9861 Coronary angioplasty status: Secondary | ICD-10-CM | POA: Diagnosis not present

## 2017-12-17 NOTE — Progress Notes (Addendum)
Daily Session Note  Patient Details  Name: Tyler Williams MRN: [id number] Date of Birth: 11/06/1926 Referring Provider:     [place] from 12/13/2017 in [facility]  Referring Provider  Dr. Burt Knack       Encounter Date: 12/17/2017  Check In:   Capillary Blood Glucose: No results found for this or any previous visit (from the past 24 hour(s)).    Social History   Tobacco Use  Smoking Status Never Smoker  Smokeless Tobacco Never Used    Goals Met:  Exercise tolerated well  Goals Unmet:  Not Applicable  Comments: Pt started cardiac rehab today.  Pt tolerated light exercise without difficulty. VSS, telemetry-sinus rhythm, asymptomatic.  Medication list reconciled. Pt denies barriers to medicaiton compliance.  PSYCHOSOCIAL ASSESSMENT:  PHQ-0. Pt exhibits positive coping skills, hopeful outlook with supportive family. No psychosocial needs identified at this time, no psychosocial interventions necessary.   Pt oriented to exercise equipment and routine. Understanding verbalized.  Andi Hence, RN, BSN Cardiac Pulmonary Rehab   Dr. Fransico Him is Medical Director for Cardiac Rehab at [facility].

## 2017-12-19 ENCOUNTER — Ambulatory Visit (HOSPITAL_COMMUNITY): Payer: Medicare Other

## 2017-12-19 ENCOUNTER — Encounter (HOSPITAL_COMMUNITY)
Admission: RE | Admit: 2017-12-19 | Discharge: 2017-12-19 | Disposition: A | Discharge status: Home / Self Care | Payer: Medicare Other | Source: Ambulatory Visit | Attending: Cardiovascular Disease | Admitting: Cardiovascular Disease

## 2017-12-19 DIAGNOSIS — Z9861 Coronary angioplasty status: Secondary | ICD-10-CM | POA: Diagnosis not present

## 2017-12-20 ENCOUNTER — Telehealth: Payer: Self-pay | Admitting: Physician Assistant

## 2017-12-20 NOTE — Telephone Encounter (Signed)
New Message        STAT if HR is under 50 or over 120 (normal HR is 60-100 beats per minute)  1) What is your heart rate? 45  2) Do you have a log of your heart rate readings (document readings)?               33              54              35              47  3) Do you have any other symptoms? NO

## 2017-12-20 NOTE — Telephone Encounter (Signed)
Pt calling in to inform [name] PA-C, Dr Burt Knack and RN, that his BP is running around 121/59 and HR- 53.  Pt states that he is feeling "alittle woozy," at times, but denies any CP, SOB, dizziness, pre-syncopal or syncopal episodes.  Pt states inquires that maybe his Toprol XL should be decreased back to 25 mg po daily.  Pt states he is taking all other cardiac meds prescribed, and he is attending cardiac rehab as advised.  Pt would like for me to route his concerns to all parties mentioned, for further review and recommendation.  Pt states he is in no acute distress at this time, and is resting comfortably at home.  Pt states he will attend cardiac rehab tomorrow, as planned.  Educated the pt on symptoms to be concerned with and when to call 911 or go to the ER.  Advised the pt to hydrate well and continue to monitor.  Advised the pt that if his HR is low in the AM, prior to taking his beta blocker, then he should call the office back for further instructions on administration of this med.  Informed the pt that I will route his concerns to [name], Dr Burt Knack and Valetta Fuller RN for further review, recommendation, and follow-up with the pt thereafter.  Pt verbalized understanding and agrees with this plan.  Pt more than gracious for all the assistance provided.

## 2017-12-21 ENCOUNTER — Ambulatory Visit (HOSPITAL_COMMUNITY): Payer: Medicare Other

## 2017-12-21 ENCOUNTER — Encounter (HOSPITAL_COMMUNITY)
Admission: RE | Admit: 2017-12-21 | Discharge: 2017-12-21 | Disposition: A | Discharge status: Home / Self Care | Payer: Medicare Other | Source: Ambulatory Visit | Attending: Cardiovascular Disease | Admitting: Cardiovascular Disease

## 2017-12-21 DIAGNOSIS — Z9861 Coronary angioplasty status: Secondary | ICD-10-CM | POA: Diagnosis not present

## 2017-12-21 MED ORDER — METOPROLOL SUCCINATE ER 25 MG PO TB24: 25.0000 mg | ORAL_TABLET | Freq: Every day | ORAL | 3 refills | Status: DC

## 2017-12-21 NOTE — Telephone Encounter (Signed)
Instructed patient's DPR (wife) to have him DECREASE TOPROL to 25 mg daily and continue to monitor VS. She was grateful for assistance.

## 2017-12-21 NOTE — Telephone Encounter (Signed)
He can decrease Toprol XL back to 25 mg QD. Continue to monitor. Thanks [name], PA-C    12/21/2017 10:18 AM

## 2017-12-21 NOTE — Telephone Encounter (Signed)
Agree. thanks

## 2017-12-24 ENCOUNTER — Encounter (HOSPITAL_COMMUNITY)
Admission: RE | Admit: 2017-12-24 | Discharge: 2017-12-24 | Disposition: A | Discharge status: Home / Self Care | Payer: Medicare Other | Source: Ambulatory Visit | Attending: Cardiovascular Disease | Admitting: Cardiovascular Disease

## 2017-12-24 ENCOUNTER — Ambulatory Visit (HOSPITAL_COMMUNITY): Payer: Medicare Other

## 2017-12-24 DIAGNOSIS — Z9861 Coronary angioplasty status: Secondary | ICD-10-CM

## 2017-12-25 ENCOUNTER — Encounter: Payer: Self-pay | Admitting: Podiatry

## 2017-12-25 NOTE — Progress Notes (Signed)
Subjective: Tyler Williams presents today with  cc of painful, discolored, thick toenails which interfere with daily activities and routine tasks.  Pain is aggravated when wearing enclosed shoe gear. Pain is getting progressively worse and relieved with periodic professional debridement.  He is on a long term blood thinner, Warfarin.   Objective: Vascular Examination: Capillary refill time <3 seconds x 10 digits Dorsalis pedis and posterior tibial pulses present b/l No digital hair x 10 digits Skin temperature warm to warm b/l  Dermatological Examination: Skin thin and atrophic b/l Toenails 1-5 b/l discolored, thick, dystrophic with subungual debris and pain with palpation to nailbeds due to thickness of nails.  Musculoskeletal: Muscle strength 5/5 to all LE muscle groups  Neurological: Sensation intact with 10 gram monofilament. Vibratory sensation intact.  Assessment: Painful onychomycosis toenails 1-5 b/l  Long term blood thinner Edema BLE  Plan: 1. Toenails 1-5 b/l were debrided in length and girth without iatrogenic bleeding. 2. Rx given for one pair of zipper compression hose 15-20 mgHg for edema.  He is to wear daily. 3. Patient to continue soft, supportive shoe gear 4. Patient to report any pedal injuries to medical professional immediately. 5. Follow up 3 months. Patient/POA to call should there be a concern in the interim.

## 2017-12-26 ENCOUNTER — Encounter (HOSPITAL_COMMUNITY)
Admission: RE | Admit: 2017-12-26 | Discharge: 2017-12-26 | Disposition: A | Discharge status: Home / Self Care | Payer: Medicare Other | Source: Ambulatory Visit | Attending: Cardiovascular Disease | Admitting: Cardiovascular Disease

## 2017-12-26 ENCOUNTER — Ambulatory Visit (HOSPITAL_COMMUNITY): Payer: Medicare Other

## 2017-12-26 DIAGNOSIS — Z9861 Coronary angioplasty status: Secondary | ICD-10-CM

## 2017-12-28 ENCOUNTER — Ambulatory Visit (HOSPITAL_COMMUNITY): Payer: Medicare Other

## 2017-12-28 ENCOUNTER — Encounter (HOSPITAL_COMMUNITY)
Admission: RE | Admit: 2017-12-28 | Discharge: 2017-12-28 | Disposition: A | Discharge status: Home / Self Care | Payer: Medicare Other | Source: Ambulatory Visit | Attending: Cardiovascular Disease | Admitting: Cardiovascular Disease

## 2017-12-28 DIAGNOSIS — Z9861 Coronary angioplasty status: Secondary | ICD-10-CM | POA: Diagnosis not present

## 2017-12-31 ENCOUNTER — Encounter (HOSPITAL_COMMUNITY)
Admission: RE | Admit: 2017-12-31 | Discharge: 2017-12-31 | Disposition: A | Discharge status: Home / Self Care | Payer: Medicare Other | Source: Ambulatory Visit | Attending: Cardiovascular Disease | Admitting: Cardiovascular Disease

## 2017-12-31 ENCOUNTER — Ambulatory Visit (HOSPITAL_COMMUNITY): Payer: Medicare Other

## 2017-12-31 DIAGNOSIS — Z9861 Coronary angioplasty status: Secondary | ICD-10-CM | POA: Diagnosis not present

## 2018-01-02 ENCOUNTER — Encounter (HOSPITAL_COMMUNITY)
Admission: RE | Admit: 2018-01-02 | Discharge: 2018-01-02 | Disposition: A | Discharge status: Home / Self Care | Payer: Medicare Other | Source: Ambulatory Visit | Attending: Cardiovascular Disease | Admitting: Cardiovascular Disease

## 2018-01-02 ENCOUNTER — Ambulatory Visit (INDEPENDENT_AMBULATORY_CARE_PROVIDER_SITE_OTHER): Payer: Medicare Other | Admitting: Cardiovascular Disease

## 2018-01-02 ENCOUNTER — Ambulatory Visit (HOSPITAL_COMMUNITY): Payer: Medicare Other

## 2018-01-02 ENCOUNTER — Ambulatory Visit (INDEPENDENT_AMBULATORY_CARE_PROVIDER_SITE_OTHER): Payer: Medicare Other | Admitting: Pharmacist

## 2018-01-02 ENCOUNTER — Encounter: Payer: Self-pay | Admitting: Cardiovascular Disease

## 2018-01-02 VITALS — BP 134/52 | HR 59 | Ht 68.0 in | Wt 195.8 lb

## 2018-01-02 DIAGNOSIS — I25119 Atherosclerotic heart disease of native coronary artery with unspecified angina pectoris: Secondary | ICD-10-CM

## 2018-01-02 DIAGNOSIS — Z9861 Coronary angioplasty status: Secondary | ICD-10-CM

## 2018-01-02 DIAGNOSIS — I4821 Permanent atrial fibrillation: Secondary | ICD-10-CM

## 2018-01-02 DIAGNOSIS — I5032 Chronic diastolic (congestive) heart failure: Secondary | ICD-10-CM | POA: Diagnosis not present

## 2018-01-02 DIAGNOSIS — Z5181 Encounter for therapeutic drug level monitoring: Secondary | ICD-10-CM | POA: Diagnosis not present

## 2018-01-02 DIAGNOSIS — Z952 Presence of prosthetic heart valve: Secondary | ICD-10-CM

## 2018-01-02 DIAGNOSIS — I4891 Unspecified atrial fibrillation: Secondary | ICD-10-CM

## 2018-01-02 LAB — POCT INR: INR: 2.3 (ref 2.0–3.0)

## 2018-01-02 NOTE — Progress Notes (Signed)
Cardiology Office Note:    Date:  01/02/2018   ID:  Tyler Williams, DOB 1926-04-24, MRN [id number]  PCP:  Burnard Bunting, MD  Cardiologist:  Sherren Mocha, MD  Electrophysiologist:  None   Referring MD: Burnard Bunting, MD   Chief Complaint  Patient presents with  . Follow-up    CAD, aortic valve disease    History of Present Illness:    Tyler Williams is a 84 y.o. male with a hx of atrial fibrillation, coronary artery disease status post angioplasty in the 1980s and 1990s, peripheral vascular disease, AAA status post repair, aortic stenosis status post TAVR in 4315, diastolic heart failure, long-term anticoagulation with warfarin.  The patient is here alone today.  He reports no chest pain, heart palpitations, or shortness of breath.  His biggest complaint about his health is bilateral leg swelling.  States that his legs look good when he first wakes up in the morning but through the course of the day he develops worsening edema.  He wears compression stockings but has trouble getting them on.  He tries to elevate his legs when he can.  His wife's health is declining and this is been tough on him.  The patient is also lost his ability to drive because of macular degeneration.  Past Medical History:  Diagnosis Date  . A-fib ([place])   . AAA (abdominal aortic aneurysm) ([place])    s/p repair  . Aortic stenosis, mild    a. mean gradient 17 mmHg on 04/2012 TTE  . Arthritis    knees  . CAD (coronary artery disease)    a. s/p multiple percutaneous prior coronary artery interventions b. 05/2012: unsuccessful PCI to tight RCA->medically managed  . Cancer [facility])    skin cancer removed, right hand, left leg, chest  . Carotid artery disease ([place])    0-39% bilateral ICA stenoses 11/2011  . Chronic anticoagulation    Coumadin  . Coronary artery disease   . GERD (gastroesophageal reflux disease)   . GERD (gastroesophageal reflux disease)   . History of pulmonary embolism    1964 related to dislocated hip on right   . History of skin cancer   . Hypercholesteremia   . Hypertension   . Incidental cecal mass noted on CT imaging 05/24/2016   **An incidental finding of potential clinical significance has been found. 4.6 x 5.6 x 3.7 cm masslike lesion in the cecum adjacent to the ileocecal valve may represent a large polyp or colonic neoplasm. Correlation with nonemergent colonoscopy is strongly recommended in the near future to better evaluate this finding.**  . Macular degeneration    eye injections  . Microscopic hematuria    Followed by urology  . Permanent atrial fibrillation   . PVD (peripheral vascular disease) ([place])    Bilateral renal artery atherosclerosis, SMA stenosis with collateralization  . RBBB    Chronic  . 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 SSS (sick sinus syndrome) [facility])     Past Surgical History:  Procedure Laterality Date  . ABDOMINAL AORTIC ANEURYSM REPAIR     1993   . CARDIAC CATHETERIZATION  05/24/2012   pD1 20%, oCFX 20%, mCFX 30%, ostial Int Br 30%, distal AV groove CFX 30%, pRCA 30-40%, mRCA 99%, dRCA 30%, PL branch small with diffuse 40%. Unable to pass wire past RCA lesion->medically managed  . CHOLECYSTECTOMY N/A 12/05/2012   Procedure:  LAPAROSCOPIC CHOLECYSTECTOMY ;  Surgeon: Edward Jolly, MD;  Location: [place];  Service: General;  Laterality: N/A;  . CHOLECYSTECTOMY    . CORONARY ANGIOPLASTY  1980, 1990  . CORONARY BALLOON ANGIOPLASTY N/A 08/31/2017   Procedure: CORONARY BALLOON ANGIOPLASTY;  Surgeon: Martinique, Peter M, MD;  Location: [place] CV LAB;  Service: Cardiovascular;  Laterality: N/A;  . EYE SURGERY     hx of cataract surgery   . [place]  . HERNIA REPAIR     right inguinal hernia repair   . JOINT REPLACEMENT     left knee replacement, partial right  . LEFT HEART  CATH AND CORONARY ANGIOGRAPHY N/A 08/31/2017   Procedure: LEFT HEART CATH AND CORONARY ANGIOGRAPHY;  Surgeon: Martinique, Peter M, MD;  Location: [place] [facility];  Service: Cardiovascular;  Laterality: N/A;  . LEFT HEART CATHETERIZATION WITH CORONARY ANGIOGRAM N/A 05/24/2012   Procedure: LEFT HEART CATHETERIZATION WITH CORONARY ANGIOGRAM;  Surgeon: Burnell Blanks, MD;  Location: [facility] CATH LAB;  Service: Cardiovascular;  Laterality: N/A;  . ORTHOPEDIC SURGERY    . OTHER SURGICAL HISTORY     right knee popliteal aneurysm surgery stent placed   . OTHER SURGICAL HISTORY    . PARTIAL KNEE ARTHROPLASTY  01/22/2012   Procedure: UNICOMPARTMENTAL KNEE;  Surgeon: Mauri Pole, MD;  Location: [facility];  Service: Orthopedics;  Laterality: Right;  . PERCUTANEOUS CORONARY INTERVENTION-BALLOON ONLY  05/24/2012   Procedure: PERCUTANEOUS CORONARY INTERVENTION-BALLOON ONLY;  Surgeon: Burnell Blanks, MD;  Location: [facility];  Service: Cardiovascular;;  . RIGHT/LEFT HEART CATH AND CORONARY ANGIOGRAPHY N/A 05/12/2016   Procedure: Right/Left Heart Cath and Coronary Angiography;  Surgeon: Sherren Mocha, MD;  Location: [place] [facility];  Service: Cardiovascular;  Laterality: N/A;  . TEE WITHOUT CARDIOVERSION N/A 06/13/2016   Procedure: TRANSESOPHAGEAL ECHOCARDIOGRAM (TEE);  Surgeon: Sherren Mocha, MD;  Location: [place];  Service: Open Heart Surgery;  Laterality: N/A;  . TONSILLECTOMY    . TOTAL KNEE ARTHROPLASTY Left   . TRANSCATHETER AORTIC VALVE REPLACEMENT, TRANSFEMORAL N/A 06/13/2016   Procedure: TRANSCATHETER AORTIC VALVE REPLACEMENT, TRANSFEMORAL;  Surgeon: Sherren Mocha, MD;  Location: [place];  Service: Open Heart Surgery;  Laterality: N/A;    Current Medications: Current Meds  Medication Sig  . amLODipine (NORVASC) 10 MG tablet Take 10 mg by mouth daily.  Marland Kitchen aspirin EC 81 MG tablet Take 81 mg by mouth daily.  Marland Kitchen atorvastatin (LIPITOR) 20 MG tablet Take 20 mg by mouth every evening.  .  Calcium-Magnesium-Zinc (CAL-MAG-ZINC PO) Take 1 tablet by mouth daily.  . Cholecalciferol (VITAMIN D3) 5000 units TABS Take 1 tablet by mouth daily.  . Coenzyme Q10 (COQ-10) 10 MG CAPS Take 10 mg by mouth daily.  . fesoterodine (TOVIAZ) 8 MG TB24 tablet Take 8 mg by mouth every evening.  . furosemide (LASIX) 40 MG tablet Take 40 mg by mouth daily.  . isosorbide mononitrate (IMDUR) 60 MG 24 hr tablet Take 90 mg by mouth daily.  Marland Kitchen LORazepam (ATIVAN) 1 MG tablet Take 1 mg by mouth at bedtime.  . metoprolol succinate (TOPROL-XL) 25 MG 24 hr tablet Take 1 tablet (25 mg total) by mouth daily.  . multivitamin-lutein (OCUVITE-LUTEIN) CAPS capsule Take 1 capsule by mouth daily.  . nitroGLYCERIN (NITROSTAT) 0.4 MG SL tablet Place 0.4 mg under the tongue every 5 (five) minutes as needed for chest pain.  Marland Kitchen omega-3 acid ethyl esters (LOVAZA) 1 G capsule Take 1 g by mouth  daily.  . Omega-3 Fatty Acids (FISH OIL PO) Take 1 tablet by mouth daily.  . ramipril (ALTACE) 10 MG capsule Take 10 mg by mouth 2 (two) times daily.  . ranibizumab (LUCENTIS) 0.5 MG/0.05ML SOLN 0.5 mg by Intravitreal route See admin instructions. Every 9 weeks  . warfarin (COUMADIN) 5 MG tablet Take 5-7.5 mg by mouth See admin instructions. Take 1 and 1/2 tablets on Monday then take 1 tablet all the other days     Allergies:   No known allergies   Social History   Socioeconomic History  . Marital status: Single    Spouse name: Not on file  . Number of children: Not on file  . Years of education: Not on file  . Highest education level: Not on file  Occupational History  . Not on file  Social Needs  . Financial resource strain: Not on file  . Food insecurity:    Worry: Not on file    Inability: Not on file  . Transportation needs:    Medical: Not on file    Non-medical: Not on file  Tobacco Use  . Smoking status: Never Smoker  . Smokeless tobacco: Never Used  Substance and Sexual Activity  . Alcohol use: Not Currently     Frequency: Never    Comment: patient doesn't drink anymore  . Drug use: Never  . Sexual activity: Not on file  Lifestyle  . Physical activity:    Days per week: Not on file    Minutes per session: Not on file  . Stress: Not on file  Relationships  . Social connections:    Talks on phone: Not on file    Gets together: Not on file    Attends religious service: Not on file    Active member of club or organization: Not on file    Attends meetings of clubs or organizations: Not on file    Relationship status: Not on file  Other Topics Concern  . Not on file  Social History Narrative   ** Merged History Encounter **       [place]; played football; score touchdown against [employer]     Family History: The patient's family history includes Heart attack (age of onset: 60) in his mother.  ROS:   Please see the history of present illness.    Leg swelling, vision loss, easy bruising, fatigue, constipation. All other systems reviewed and are negative.  EKGs/Labs/Other Studies Reviewed:    The following studies were reviewed today: Echo 08-28-2017: Left ventricle:  The cavity size was normal. Wall thickness was increased in a pattern of mild LVH. Systolic function was normal. The estimated ejection fraction was in the range of 55% to 60%. Regional wall motion abnormalities:   There is akinesis of the basalinferolateral myocardium. The study was not technically sufficient to allow evaluation of LV diastolic dysfunction due to atrial fibrillation.  ------------------------------------------------------------------- Aortic valve:  A bioprosthesis was present. Mobility was not restricted.  Doppler:  Transvalvular velocity was within the normal range. There was no stenosis. There was trivial regurgitation.  ------------------------------------------------------------------- Aorta:  Aortic root: The aortic root was normal in  size.  ------------------------------------------------------------------- Mitral valve:   Calcified annulus. Mobility was not restricted. Doppler:  Transvalvular velocity was within the normal range. There was no evidence for stenosis. There was mild regurgitation.    Peak gradient (D): 6 mm Hg.  ------------------------------------------------------------------- Left atrium:  The atrium was severely dilated.  ------------------------------------------------------------------- Right ventricle:  The  cavity size was normal. Systolic function was normal.  ------------------------------------------------------------------- Pulmonic valve:    Doppler:  Transvalvular velocity was within the normal range. There was no evidence for stenosis. There was trivial regurgitation.  ------------------------------------------------------------------- Tricuspid valve:   Structurally normal valve.    Doppler: Transvalvular velocity was within the normal range. There was trivial regurgitation.  ------------------------------------------------------------------- Pulmonary artery:   Systolic pressure was mildly increased.  ------------------------------------------------------------------- Right atrium:  The atrium was moderately dilated.  ------------------------------------------------------------------- Pericardium:  There was no pericardial effusion.  ------------------------------------------------------------------- Systemic veins: Inferior vena cava: The vessel was dilated.  EKG:  EKG is not ordered today.    Recent Labs: 09/01/2017: BUN 14; Creatinine, Ser 0.92; Hemoglobin 15.0; Platelets 121; Potassium 3.8; Sodium 139  Recent Lipid Panel    Component Value Date/Time   CHOL 149 08/28/2017 0350   TRIG 95 08/28/2017 0350   HDL 52 08/28/2017 0350   CHOLHDL 2.9 08/28/2017 0350   VLDL 19 08/28/2017 0350   LDLCALC 78 08/28/2017 0350    Physical Exam:    VS:  BP (!) 134/52    Pulse (!) 59   Ht 5\' 8"  (1.727 m)   Wt 195 lb 12.8 oz (88.8 kg)   SpO2 94%   BMI 29.77 kg/m     Wt Readings from Last 3 Encounters:  01/02/18 195 lb 12.8 oz (88.8 kg)  12/13/17 195 lb 15.8 oz (88.9 kg)  09/25/17 186 lb (84.4 kg)     GEN: Well nourished, well developed elderly male in no acute distress HEENT: Normal NECK: No JVD; No carotid bruits LYMPHATICS: No lymphadenopathy CARDIAC: Irregular with grade 1/6 systolic murmur at the right upper sternal border RESPIRATORY:  Clear to auscultation without rales, wheezing or rhonchi  ABDOMEN: Soft, non-tender, non-distended MUSCULOSKELETAL: 2+ bilateral pretibial edema; No deformity  SKIN: Warm and dry NEUROLOGIC:  Alert and oriented x 3 PSYCHIATRIC:  Normal affect   ASSESSMENT:    1. Permanent atrial fibrillation   2. Chronic diastolic CHF (congestive heart failure) ([place])   3. S/P TAVR (transcatheter aortic valve replacement)   4. Coronary artery disease involving native coronary artery of native heart with angina pectoris ([place])    PLAN:    In order of problems listed above:  1. The patient is tolerating warfarin without bleeding problems.  No changes are made today.  His heart rate is well controlled. 2. The patient appears euvolemic.  His chronic leg swelling is related to venous insufficiency.  He has no symptoms of congestive heart failure at present and no limitation from his breathing.  He continues on furosemide. 3. The patient's aortic bioprosthesis is functioning normally based on postoperative echo studies. 4. The patient is having no anginal symptoms.  His records are reviewed and he underwent another attempt at RCA intervention earlier this year but it was unsuccessful because of inability to expand the balloon.  He continues on aspirin for antiplatelet, statin drug, and a beta-blocker as well as isosorbide.     Medication Adjustments/Labs and Tests Ordered: Current medicines are reviewed at length with the  patient today.  Concerns regarding medicines are outlined above.  No orders of the defined types were placed in this encounter.  No orders of the defined types were placed in this encounter.   Patient Instructions  Medication Instructions:  Your provider recommends that you continue on your current medications as directed. Please refer to the Current Medication list given to you today.    Labwork: None  Testing/Procedures: None  Follow-Up:  Your provider wants you to follow-up in: 6 months with Dr. Burt Knack. You will receive a reminder letter in the mail two months in advance. If you don't receive a letter, please call our office to schedule the follow-up appointment.    Any Other Special Instructions Will Be Listed Below (If Applicable). For your  leg edema you  should do  the following 1. Leg elevation - I recommend the Lounge Dr. Leg rest.  See below for details  2. Salt restriction  -  Use potassium chloride instead of regular salt as a salt substitute. 3. Walk regularly 4. Compression hose - [facility] 5. Weight loss    Available on [email or web address] Or  Go to [email or web address]        Signed, Sherren Mocha, MD  01/02/2018 5:24 PM    [place]

## 2018-01-02 NOTE — Patient Instructions (Signed)
Medication Instructions:  Your provider recommends that you continue on your current medications as directed. Please refer to the Current Medication list given to you today.    Labwork: None  Testing/Procedures: None  Follow-Up: Your provider wants you to follow-up in: 6 months with Dr. Burt Knack. You will receive a reminder letter in the mail two months in advance. If you don't receive a letter, please call our office to schedule the follow-up appointment.    Any Other Special Instructions Will Be Listed Below (If Applicable). For your  leg edema you  should do  the following 1. Leg elevation - I recommend the Lounge Dr. Leg rest.  See below for details  2. Salt restriction  -  Use potassium chloride instead of regular salt as a salt substitute. 3. Walk regularly 4. Compression hose - [facility] 5. Weight loss    Available on [email or web address] Or  Go to [email or web address]

## 2018-01-02 NOTE — Patient Instructions (Signed)
Description   Continue taking 1 tablet every day except 1.5 tablets on Mondays.  Recheck INR in 6 weeks.  Call if placed on any new medications or if scheduled for any procedures.  Coumadin clinic [phone].

## 2018-01-03 ENCOUNTER — Encounter (HOSPITAL_COMMUNITY): Payer: Self-pay

## 2018-01-03 NOTE — Progress Notes (Addendum)
Cardiac Individual Treatment Plan  Patient Details  Name: Tyler Williams MRN: [id number] Date of Birth: 1926/10/26 Referring Provider:   Flowsheet Row CARDIAC REHAB PHASE II ORIENTATION from 12/13/2017 in [facility]  Referring Provider  Dr. Burt Knack       Initial Encounter Date:  Flowsheet Row CARDIAC REHAB PHASE II ORIENTATION from 12/13/2017 in [facility]  Date  12/12/17      Visit Diagnosis: 08/31/2017 S/P PTCA (percutaneous transluminal coronary angioplasty)  Patient's Home Medications on Admission:  Current Outpatient Medications:  .  amLODipine (NORVASC) 10 MG tablet, Take 10 mg by mouth daily., Disp: , Rfl:  .  aspirin EC 81 MG tablet, Take 81 mg by mouth daily., Disp: , Rfl:  .  atorvastatin (LIPITOR) 20 MG tablet, Take 20 mg by mouth every evening., Disp: , Rfl:  .  Calcium-Magnesium-Zinc (CAL-MAG-ZINC PO), Take 1 tablet by mouth daily., Disp: , Rfl:  .  Cholecalciferol (VITAMIN D3) 5000 units TABS, Take 1 tablet by mouth daily., Disp: , Rfl:  .  Coenzyme Q10 (COQ-10) 10 MG CAPS, Take 10 mg by mouth daily., Disp: , Rfl:  .  fesoterodine (TOVIAZ) 8 MG TB24 tablet, Take 8 mg by mouth every evening., Disp: , Rfl:  .  furosemide (LASIX) 40 MG tablet, Take 40 mg by mouth daily., Disp: , Rfl:  .  isosorbide mononitrate (IMDUR) 60 MG 24 hr tablet, Take 90 mg by mouth daily., Disp: , Rfl:  .  LORazepam (ATIVAN) 1 MG tablet, Take 1 mg by mouth at bedtime., Disp: , Rfl:  .  metoprolol succinate (TOPROL-XL) 25 MG 24 hr tablet, Take 1 tablet (25 mg total) by mouth daily., Disp: 90 tablet, Rfl: 3 .  multivitamin-lutein (OCUVITE-LUTEIN) CAPS capsule, Take 1 capsule by mouth daily., Disp: , Rfl:  .  nitroGLYCERIN (NITROSTAT) 0.4 MG SL tablet, Place 0.4 mg under the tongue every 5 (five) minutes as needed for chest pain., Disp: , Rfl:  .  omega-3 acid ethyl esters (LOVAZA) 1 G capsule, Take 1 g by mouth daily., Disp: ,  Rfl:  .  Omega-3 Fatty Acids (FISH OIL PO), Take 1 tablet by mouth daily., Disp: , Rfl:  .  ramipril (ALTACE) 10 MG capsule, Take 10 mg by mouth 2 (two) times daily., Disp: , Rfl:  .  ranibizumab (LUCENTIS) 0.5 MG/0.05ML SOLN, 0.5 mg by Intravitreal route See admin instructions. Every 9 weeks, Disp: , Rfl:  .  warfarin (COUMADIN) 5 MG tablet, Take 5-7.5 mg by mouth See admin instructions. Take 1 and 1/2 tablets on Monday then take 1 tablet all the other days, Disp: , Rfl:   Past Medical History: Past Medical History:  Diagnosis Date  . A-fib ([place])   . AAA (abdominal aortic aneurysm) ([place])    s/p repair  . Aortic stenosis, mild    a. mean gradient 17 mmHg on 04/2012 TTE  . Arthritis    knees  . CAD (coronary artery disease)    a. s/p multiple percutaneous prior coronary artery interventions b. 05/2012: unsuccessful PCI to tight RCA->medically managed  . Cancer [facility])    skin cancer removed, right hand, left leg, chest  . Carotid artery disease ([place])    0-39% bilateral ICA stenoses 11/2011  . Chronic anticoagulation    Coumadin  . Coronary artery disease   . GERD (gastroesophageal reflux disease)   . GERD (gastroesophageal reflux disease)   . History of pulmonary embolism    1964  related to dislocated hip on right   . History of skin cancer   . Hypercholesteremia   . Hypertension   . Incidental cecal mass noted on CT imaging 05/24/2016   **An incidental finding of potential clinical significance has been found. 4.6 x 5.6 x 3.7 cm masslike lesion in the cecum adjacent to the ileocecal valve may represent a large polyp or colonic neoplasm. Correlation with nonemergent colonoscopy is strongly recommended in the near future to better evaluate this finding.**  . Macular degeneration    eye injections  . Microscopic hematuria    Followed by urology  . Permanent atrial fibrillation   . PVD (peripheral vascular disease) ([place])    Bilateral renal artery atherosclerosis, SMA stenosis with  collateralization  . RBBB    Chronic  . 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 SSS (sick sinus syndrome) ([facility])     Tobacco Use: Social History   Tobacco Use  Smoking Status Never Smoker  Smokeless Tobacco Never Used    Labs: Recent Review Flowsheet Data    Labs for ITP Cardiac and Pulmonary Rehab Latest Ref Rng & Units 06/13/2016 06/13/2016 06/13/2016 06/13/2016 08/28/2017   Cholestrol 0 - 200 mg/dL - - - - 149   LDLCALC 0 - 99 mg/dL - - - - 78   HDL >40 mg/dL - - - - 52   Trlycerides <150 mg/dL - - - - 95   Hemoglobin A1c 4.8 - 5.6 % - - - - -   PHART 7.350 - 7.450 - - - 7.347(L) -   PCO2ART 32.0 - 48.0 mmHg - - - 40.6 -   HCO3 20.0 - 28.0 mmol/L - - - 22.5 -   TCO2 0 - 100 mmol/L 27 25 25 24  -   ACIDBASEDEF 0.0 - 2.0 mmol/L - - - 3.0(H) -   O2SAT % - - - 95.0 -      Capillary Blood Glucose: No results found for: GLUCAP   Exercise Target Goals: Exercise Program Goal: Individual exercise prescription set using results from initial 6 min walk test and THRR while considering  patient's activity barriers and safety.   Exercise Prescription Goal: Initial exercise prescription builds to 30-45 minutes a day of aerobic activity, 2-3 days per week.  Home exercise guidelines will be given to patient during program as part of exercise prescription that the participant will acknowledge.  Activity Barriers & Risk Stratification: Activity Barriers & Cardiac Risk Stratification - 12/13/17 1037    Activity Barriers & Cardiac Risk Stratification          Activity Barriers  Deconditioning    Cardiac Risk Stratification  High           6 Minute Walk: 6 Minute Walk    6 Minute Walk    Row Name 12/13/17 1036   Phase  Initial   Distance  1105 feet   Walk Time  6 minutes   # of Rest Breaks  0   MPH  2.09   METS  1.17   RPE  12   VO2 Peak  4.09   Symptoms   No   Resting HR  56 bpm   Resting BP  124/60   Resting Oxygen Saturation   95 %   Exercise Oxygen Saturation  during 6 min walk  94 %   Max Ex. HR  71 bpm   Max  Ex. BP  140/80   2 Minute Post BP  126/53          Oxygen Initial Assessment:   Oxygen Re-Evaluation:   Oxygen Discharge (Final Oxygen Re-Evaluation):   Initial Exercise Prescription: Initial Exercise Prescription - 12/13/17 1100    Date of Initial Exercise RX and Referring Provider          Date  12/12/17    Referring Provider  Dr. Burt Knack     Expected Discharge Date  03/22/18        NuStep          Level  2    SPM  75    Minutes  10    METs  2        Arm Ergometer          Level  2    Watts  20    Minutes  10    METs  2.1        Track          Laps  7    Minutes  10    METs  2.23        Prescription Details          Frequency (times per week)  3    Duration  Progress to 30 minutes of continuous aerobic without signs/symptoms of physical distress        Intensity          THRR 40-80% of Max Heartrate  52-104    Ratings of Perceived Exertion  11-13        Progression          Progression  Continue to progress workloads to maintain intensity without signs/symptoms of physical distress.        Resistance Training          Training Prescription  Yes    Weight  2 lbs    Reps  10-15           Perform Capillary Blood Glucose checks as needed.  Exercise Prescription Changes: Exercise Prescription Changes    Response to Exercise    Row Name 01/01/18 1536   Blood Pressure (Admit)  132/60   Blood Pressure (Exercise)  140/70   Blood Pressure (Exit)  142/68   Heart Rate (Admit)  72 bpm   Heart Rate (Exercise)  87 bpm   Heart Rate (Exit)  65 bpm   Rating of Perceived Exertion (Exercise)  11   Duration  Continue with 30 min of aerobic exercise without signs/symptoms of physical distress.   Intensity  THRR New       Progression    Row Name 01/01/18 1536   Progression   Continue to progress workloads to maintain intensity without signs/symptoms of physical distress.   Average METs  2.2       Resistance Training    Row Name 01/01/18 1536   Training Prescription  Yes   Weight  2 lbs   Reps  10-15   Time  [place] Name 01/01/18 1536   Level  2   SPM  75   Minutes  10   METs  2       Arm Ergometer    Row Name 01/01/18 1536   Level  2   Watts  20   Minutes  10   METs  2.17       Track  [place] Name 01/01/18 1536   Laps  9   Minutes  10   METs  2.22          Exercise Comments: Exercise Comments    Row Name 01/02/18 1445   Exercise Comments  Reviewed METs and goals with Pt. Pt will continue exercise at home in additoin to cardiac rehab.       Exercise Goals and Review: Exercise Goals    Exercise Goals    Row Name 12/13/17 1038   Increase Physical Activity  Yes   Intervention  Develop an individualized exercise prescription for aerobic and resistive training based on initial evaluation findings, risk stratification, comorbidities and participant's personal goals.;Provide advice, education, support and counseling about physical activity/exercise needs.   Expected Outcomes  Short Term: Attend rehab on a regular basis to increase amount of physical activity.   Increase Strength and Stamina  Yes   Intervention  Provide advice, education, support and counseling about physical activity/exercise needs.;Develop an individualized exercise prescription for aerobic and resistive training based on initial evaluation findings, risk stratification, comorbidities and participant's personal goals.   Expected Outcomes  Short Term: Increase workloads from initial exercise prescription for resistance, speed, and METs.   Able to understand and use rate of perceived exertion (RPE) scale  Yes   Intervention  Provide education and explanation on how to use RPE scale   Expected Outcomes  Short Term: Able to use RPE daily in rehab to  express subjective intensity level;Long Term:  Able to use RPE to guide intensity level when exercising independently   Knowledge and understanding of Target Heart Rate Range (THRR)  Yes   Intervention  Provide education and explanation of THRR including how the numbers were predicted and where they are located for reference   Expected Outcomes  Short Term: Able to state/look up THRR;Long Term: Able to use THRR to govern intensity when exercising independently;Short Term: Able to use daily as guideline for intensity in rehab   Able to check pulse independently  Yes   Intervention  Provide education and demonstration on how to check pulse in carotid and radial arteries.;Review the importance of being able to check your own pulse for safety during independent exercise   Expected Outcomes  Short Term: Able to explain why pulse checking is important during independent exercise;Long Term: Able to check pulse independently and accurately   Understanding of Exercise Prescription  Yes   Intervention  Provide education, explanation, and written materials on patient's individual exercise prescription   Expected Outcomes  Short Term: Able to explain program exercise prescription;Long Term: Able to explain home exercise prescription to exercise independently          Exercise Goals Re-Evaluation : Exercise Goals Re-Evaluation    Exercise Goal Re-Evaluation    Row Name 01/02/18 1443   Exercise Goals Review  Increase Physical Activity;Able to understand and use rate of perceived exertion (RPE) scale;Knowledge and understanding of Target Heart Rate Range (THRR);Understanding of Exercise Prescription;Increase Strength and Stamina;Able to check pulse independently   Comments  Reviewed METs and goals with Pt. Pt MET level is 2.2. Pt is biking 15 minutes 3 days per week in addition to cardiac rehab.    Expected Outcomes  Will continue to monitor and progress Pt as tolerated.           Discharge Exercise  Prescription (Final Exercise Prescription Changes): Exercise Prescription Changes - 01/01/18 1536    Response to Exercise  Blood Pressure (Admit)  132/60    Blood Pressure (Exercise)  140/70    Blood Pressure (Exit)  142/68    Heart Rate (Admit)  72 bpm    Heart Rate (Exercise)  87 bpm    Heart Rate (Exit)  65 bpm    Rating of Perceived Exertion (Exercise)  11    Duration  Continue with 30 min of aerobic exercise without signs/symptoms of physical distress.    Intensity  THRR New        Progression          Progression  Continue to progress workloads to maintain intensity without signs/symptoms of physical distress.    Average METs  2.2        Resistance Training          Training Prescription  Yes    Weight  2 lbs    Reps  10-15    Time  10 Minutes        NuStep          Level  2    SPM  75    Minutes  10    METs  2        Arm Ergometer          Level  2    Watts  20    Minutes  10    METs  2.17        Track          Laps  9    Minutes  10    METs  2.22           Nutrition:  Target Goals: Understanding of nutrition guidelines, daily intake of sodium <1586m, cholesterol <2036m calories 30% from fat and 7% or less from saturated fats, daily to have 5 or more servings of fruits and vegetables.  Biometrics: Pre Biometrics - 12/13/17 1038    Pre Biometrics          Height  5' 8"  (1.727 m)    Weight  88.9 kg    Waist Circumference  41 inches    Hip Circumference  40 inches    Waist to Hip Ratio  1.02 %    BMI (Calculated)  29.81    Triceps Skinfold  19 mm    % Body Fat  30.5 %    Grip Strength  30 kg    Flexibility  7 in    Single Leg Stand  0 seconds            Nutrition Therapy Plan and Nutrition Goals: Nutrition Therapy & Goals - 12/13/17 0947    Nutrition Therapy          Diet  heart healthy        Personal Nutrition Goals          Nutrition Goal  Pt to identify and limit food sources of saturated fat, trans fat, refined  carbohydrates and sodium    Personal Goal #2  Pt to eat a variety of non-starchy vegetables    Personal Goal #3  Pt to make better choices when eating out    Personal Goal #4  Pt to decrease intake of fried foods        Intervention Plan          Intervention  Prescribe, educate and counsel regarding individualized specific dietary modifications aiming towards targeted core components such as weight, hypertension, lipid management, diabetes, heart failure and other comorbidities.  Expected Outcomes  Short Term Goal: Understand basic principles of dietary content, such as calories, fat, sodium, cholesterol and nutrients.;Long Term Goal: Adherence to prescribed nutrition plan.           Nutrition Assessments: Nutrition Assessments - 12/13/17 0949    MEDFICTS Scores          Pre Score  69           Nutrition Goals Re-Evaluation:   Nutrition Goals Re-Evaluation:   Nutrition Goals Discharge (Final Nutrition Goals Re-Evaluation):   Psychosocial: Target Goals: Acknowledge presence or absence of significant depression and/or stress, maximize coping skills, provide positive support system. Participant is able to verbalize types and ability to use techniques and skills needed for reducing stress and depression.  Initial Review & Psychosocial Screening: Initial Psych Review & Screening - 12/13/17 1122    Initial Review          Current issues with  None Identified        Family Dynamics          Good Support System?  Yes   family        Barriers          Psychosocial barriers to participate in program  There are no identifiable barriers or psychosocial needs.        Screening Interventions          Interventions  Encouraged to exercise           Quality of Life Scores: Quality of Life - 12/13/17 1048    Quality of Life          Select  Quality of Life        Quality of Life Scores          Health/Function Pre  27.6 %    Socioeconomic Pre  30 %     Psych/Spiritual Pre  29.14 %    Family Pre  28.8 %    GLOBAL Pre  28.63 %          Scores of 19 and below usually indicate a poorer quality of life in these areas.  A difference of  2-3 points is a clinically meaningful difference.  A difference of 2-3 points in the total score of the Quality of Life Index has been associated with significant improvement in overall quality of life, self-image, physical symptoms, and general health in studies assessing change in quality of life.  PHQ-9: Recent Review Flowsheet Data    Depression screen [facility] 2/9 12/17/2017   Decreased Interest 0   Down, Depressed, Hopeless 0   PHQ - 2 Score 0     Interpretation of Total Score  Total Score Depression Severity:  1-4 = Minimal depression, 5-9 = Mild depression, 10-14 = Moderate depression, 15-19 = Moderately severe depression, 20-27 = Severe depression   Psychosocial Evaluation and Intervention: Psychosocial Evaluation - 12/17/17 1420    Psychosocial Evaluation & Interventions          Interventions  Encouraged to exercise with the program and follow exercise prescription    Comments  no psychosocial needs identified, no interventions necessary. pt unable to participate in former hobbies due to vision loss. pt currently enjoys container gardening.  pt has very supportive family.    Expected Outcomes  pt will exhibit positive outlook with good coping skills.     Continue Psychosocial Services   No Follow up required           Psychosocial Re-Evaluation: Psychosocial  Re-Evaluation    Psychosocial Re-Evaluation    Row Name 12/26/17 1436 01/03/18 1209   Current issues with  None Identified  None Identified   Comments  no psychosocial needs identified, no interventions necessary   no psychosocial needs identified, no interventions necessary    Expected Outcomes  pt will exhibit positive outlook with good coping skills.   pt will exhibit positive outlook with good coping skills.    Interventions   Encouraged to attend Cardiac Rehabilitation for the exercise  Encouraged to attend Cardiac Rehabilitation for the exercise   Continue Psychosocial Services   No Follow up required  No Follow up required          Psychosocial Discharge (Final Psychosocial Re-Evaluation): Psychosocial Re-Evaluation - 01/03/18 1209    Psychosocial Re-Evaluation          Current issues with  None Identified    Comments  no psychosocial needs identified, no interventions necessary     Expected Outcomes  pt will exhibit positive outlook with good coping skills.     Interventions  Encouraged to attend Cardiac Rehabilitation for the exercise    Continue Psychosocial Services   No Follow up required           Vocational Rehabilitation: Provide vocational rehab assistance to qualifying candidates.   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 Participant will state understanding/return demonstration of topics presented.  Learning Barriers/Preferences: Learning Barriers/Preferences - 12/12/17 1521    Learning Barriers/Preferences          Learning Barriers  Sight   macular degeneration   Learning Preferences  Written Material;Verbal Instruction           Education Topics: Count Your Pulse:  -Group instruction provided by verbal instruction, demonstration, patient participation and written materials to support subject.  Instructors address importance of being able to find your pulse and how to count your pulse when at home without a heart monitor.  Patients get hands on experience counting their pulse with staff help and individually.   Heart Attack, Angina, and Risk Factor Modification:  -Group  instruction provided by verbal instruction, video, and written materials to support subject.  Instructors address signs and symptoms of angina and heart attacks.    Also discuss risk factors for heart disease and how to make changes to improve heart health risk factors.   Functional Fitness:  -Group instruction provided by verbal instruction, demonstration, patient participation, and written materials to support subject.  Instructors address safety measures for doing things around the house.  Discuss how to get up and down off the floor, how to pick things up properly, how to safely get out of a chair without assistance, and balance training.   Meditation and Mindfulness:  -Group instruction provided by verbal instruction, patient participation, and written materials to support subject.  Instructor addresses importance of mindfulness and meditation practice to help reduce stress and improve awareness.  Instructor also leads participants through a meditation exercise.    Stretching for Flexibility and Mobility:  -Group instruction provided by verbal instruction, patient participation, and written materials to support subject.  Instructors lead participants through series of stretches that are  designed to increase flexibility thus improving mobility.  These stretches are additional exercise for major muscle groups that are typically performed during regular warm up and cool down.   Hands Only CPR:  -Group verbal, video, and participation provides a basic overview of AHA guidelines for community CPR. Role-play of emergencies allow participants the opportunity to practice calling for help and chest compression technique with discussion of AED use.   Hypertension: -Group verbal and written instruction that provides a basic overview of hypertension including the most recent diagnostic guidelines, risk factor reduction with self-care instructions and medication management.    Nutrition I class:  Heart Healthy Eating:  -Group instruction provided by PowerPoint slides, verbal discussion, and written materials to support subject matter. The instructor gives an explanation and review of the Therapeutic Lifestyle Changes diet recommendations, which includes a discussion on lipid goals, dietary fat, sodium, fiber, plant stanol/sterol esters, sugar, and the components of a well-balanced, healthy diet.   Nutrition II class: Lifestyle Skills:  -Group instruction provided by PowerPoint slides, verbal discussion, and written materials to support subject matter. The instructor gives an explanation and review of label reading, grocery shopping for heart health, heart healthy recipe modifications, and ways to make healthier choices when eating out.   Diabetes Question & Answer:  -Group instruction provided by PowerPoint slides, verbal discussion, and written materials to support subject matter. The instructor gives an explanation and review of diabetes co-morbidities, pre- and post-prandial blood glucose goals, pre-exercise blood glucose goals, signs, symptoms, and treatment of hypoglycemia and hyperglycemia, and foot care basics.   Diabetes Blitz:  -Group instruction provided by PowerPoint slides, verbal discussion, and written materials to support subject matter. The instructor gives an explanation and review of the physiology behind type 1 and type 2 diabetes, diabetes medications and rational behind using different medications, pre- and post-prandial blood glucose recommendations and Hemoglobin A1c goals, diabetes diet, and exercise including blood glucose guidelines for exercising safely.    Portion Distortion:  -Group instruction provided by PowerPoint slides, verbal discussion, written materials, and food models to support subject matter. The instructor gives an explanation of serving size versus portion size, changes in portions sizes over the last 20 years, and what consists of a serving from  each food group.   Stress Management:  -Group instruction provided by verbal instruction, video, and written materials to support subject matter.  Instructors review role of stress in heart disease and how to cope with stress positively.     Exercising on Your Own:  -Group instruction provided by verbal instruction, power point, and written materials to support subject.  Instructors discuss benefits of exercise, components of exercise, frequency and intensity of exercise, and end points for exercise.  Also discuss use of nitroglycerin and activating EMS.  Review options of places to exercise outside of rehab.  Review guidelines for sex with heart disease.   Cardiac Drugs I:  -Group instruction provided by verbal instruction and written materials to support subject.  Instructor reviews cardiac drug classes: antiplatelets, anticoagulants, beta blockers, and statins.  Instructor discusses reasons, side effects, and lifestyle considerations for each drug class.   Cardiac Drugs II:  -Group instruction provided by verbal instruction and written materials to support subject.  Instructor reviews cardiac drug classes: angiotensin converting enzyme inhibitors (ACE-I), angiotensin II receptor blockers (ARBs), nitrates, and calcium channel blockers.  Instructor discusses reasons, side effects, and lifestyle considerations for each drug class.   Anatomy and Physiology of the Circulatory System:  Group verbal and  written instruction and models provide basic cardiac anatomy and physiology, with the coronary electrical and arterial systems. Review of: AMI, Angina, Valve disease, Heart Failure, Peripheral Artery Disease, Cardiac Arrhythmia, Pacemakers, and the ICD.   Other Education:  -Group or individual verbal, written, or video instructions that support the educational goals of the cardiac rehab program.   Holiday Eating Survival Tips:  -Group instruction provided by PowerPoint slides, verbal  discussion, and written materials to support subject matter. The instructor gives patients tips, tricks, and techniques to help them not only survive but enjoy the holidays despite the onslaught of food that accompanies the holidays.   Knowledge Questionnaire Score: Knowledge Questionnaire Score - 12/13/17 1039    Knowledge Questionnaire Score          Pre Score  14/24           Core Components/Risk Factors/Patient Goals at Admission: Personal Goals and Risk Factors at Admission - 12/13/17 1053    Core Components/Risk Factors/Patient Goals on Admission           Weight Management  Yes;Weight Maintenance;Weight Loss    Admit Weight  195 lb 15.8 oz (88.9 kg)    Expected Outcomes  Short Term: Continue to assess and modify interventions until short term weight is achieved;Long Term: Adherence to nutrition and physical activity/exercise program aimed toward attainment of established weight goal;Weight Maintenance: Understanding of the daily nutrition guidelines, which includes 25-35% calories from fat, 7% or less cal from saturated fats, less than 23m cholesterol, less than 1.5gm of sodium, & 5 or more servings of fruits and vegetables daily;Weight Loss: Understanding of general recommendations for a balanced deficit meal plan, which promotes 1-2 lb weight loss per week and includes a negative energy balance of [phone] kcal/d;Understanding recommendations for meals to include 15-35% energy as protein, 25-35% energy from fat, 35-60% energy from carbohydrates, less than 2055mof dietary cholesterol, 20-35 gm of total fiber daily;Understanding of distribution of calorie intake throughout the day with the consumption of 4-5 meals/snacks    Hypertension  Yes    Intervention  Provide education on lifestyle modifcations including regular physical activity/exercise, weight management, moderate sodium restriction and increased consumption of fresh fruit, vegetables, and low fat dairy, alcohol moderation,  and smoking cessation.;Monitor prescription use compliance.    Expected Outcomes  Short Term: Continued assessment and intervention until BP is < 140/9032mG in hypertensive participants. < 130/52m69m in hypertensive participants with diabetes, heart failure or chronic kidney disease.;Long Term: Maintenance of blood pressure at goal levels.    Intervention  Provide education and support for participant on nutrition & aerobic/resistive exercise along with prescribed medications to achieve LDL <70mg67mL >40mg.18mExpected Outcomes  Short Term: Participant states understanding of desired cholesterol values and is compliant with medications prescribed. Participant is following exercise prescription and nutrition guidelines.;Long Term: Cholesterol controlled with medications as prescribed, with individualized exercise RX and with personalized nutrition plan. Value goals: LDL < 70mg, 53m> 40 mg.           Core Components/Risk Factors/Patient Goals Review:  Goals and Risk Factor Review    Core Components/Risk Factors/Patient Goals Review    Row Name 12/17/17 1421 01/03/18 1210   Personal Goals Review  Weight Management/Obesity;Hypertension;Lipids  Weight Management/Obesity;Hypertension;Lipids   Review  pt with CAD RF demonstrates eagerness to participate in CR program. pt personal goals are to lose weight and improve overall health/well being. pt would like to restore his recent funcational decline.  pt with CAD RF demonstrates eagerness to participate in CR program. pt personal goals are to lose weight and improve overall health/well being. pt would like to improve upon his recent funcational decline and restore activity level to baseline prior to cardiac event.    Expected Outcomes  pt will participate in CR exercise, nutrition and lifestyle modification to decrease overall RF to achieve personal and program goals.   pt will participate in CR exercise, nutrition and lifestyle modification to decrease  overall RF to achieve personal and program goals.           Core Components/Risk Factors/Patient Goals at Discharge (Final Review):  Goals and Risk Factor Review - 01/03/18 1210    Core Components/Risk Factors/Patient Goals Review          Personal Goals Review  Weight Management/Obesity;Hypertension;Lipids    Review  pt with CAD RF demonstrates eagerness to participate in CR program. pt personal goals are to lose weight and improve overall health/well being. pt would like to improve upon his recent funcational decline and restore activity level to baseline prior to cardiac event.     Expected Outcomes  pt will participate in CR exercise, nutrition and lifestyle modification to decrease overall RF to achieve personal and program goals.            ITP Comments: ITP Comments    Row Name 12/12/17 1519 12/13/17 3958 12/17/17 1419 01/03/18 1209   ITP Comments  Dr.Mida Cory Radford Pax, Medical Director   Dr.Martel Galvan, Medical Director   pt started group exercise. pt tolerated light activity without difficulty. pt oriented to exercise equipment and safety routine.  pt verbalized understanding.   30 day ITP review. pt with good attendance and participation.  pt demonstrates eagerness to partcipate in CR program.       Comments:

## 2018-01-04 ENCOUNTER — Encounter (HOSPITAL_COMMUNITY)
Admission: RE | Admit: 2018-01-04 | Discharge: 2018-01-04 | Disposition: A | Discharge status: Home / Self Care | Payer: Medicare Other | Source: Ambulatory Visit | Attending: Cardiovascular Disease | Admitting: Cardiovascular Disease

## 2018-01-04 ENCOUNTER — Ambulatory Visit (HOSPITAL_COMMUNITY): Payer: Medicare Other

## 2018-01-04 DIAGNOSIS — Z9861 Coronary angioplasty status: Secondary | ICD-10-CM

## 2018-01-04 NOTE — Progress Notes (Signed)
I have reviewed a Home Exercise Prescription with Tyler Williams . Tyler Williams is currently exercising at home.  The patient was advised to walk 5-7 days a week for 30-45 minutes.  Tyler Williams and I discussed how to progress their exercise prescription.  The patient stated that their goals were to increase his exercise time to 30 minutes by walking 5-7 days per week.  The patient stated that they understand the exercise prescription.  We reviewed exercise guidelines, target heart rate during exercise, RPE Scale, weather conditions, NTG use, endpoints for exercise, warmup and cool down.  Patient is encouraged to come to me with any questions. I will continue to follow up with the patient to assist them with progression and safety.    01/04/2018 2:35 PM  Deitra Mayo BS, ACSM CEP

## 2018-01-07 ENCOUNTER — Ambulatory Visit (HOSPITAL_COMMUNITY): Payer: Medicare Other

## 2018-01-07 ENCOUNTER — Encounter (HOSPITAL_COMMUNITY): Payer: Medicare Other

## 2018-01-07 ENCOUNTER — Encounter (HOSPITAL_COMMUNITY)
Admission: RE | Admit: 2018-01-07 | Discharge: 2018-01-07 | Disposition: A | Discharge status: Home / Self Care | Payer: Medicare Other | Source: Ambulatory Visit | Attending: Cardiovascular Disease | Admitting: Cardiovascular Disease

## 2018-01-07 DIAGNOSIS — Z9861 Coronary angioplasty status: Secondary | ICD-10-CM

## 2018-01-09 ENCOUNTER — Encounter (HOSPITAL_COMMUNITY)
Admission: RE | Admit: 2018-01-09 | Discharge: 2018-01-09 | Disposition: A | Discharge status: Home / Self Care | Payer: Medicare Other | Source: Ambulatory Visit | Attending: Cardiovascular Disease | Admitting: Cardiovascular Disease

## 2018-01-09 ENCOUNTER — Ambulatory Visit (HOSPITAL_COMMUNITY): Payer: Medicare Other

## 2018-01-09 DIAGNOSIS — Z9861 Coronary angioplasty status: Secondary | ICD-10-CM

## 2018-01-09 NOTE — Progress Notes (Signed)
Tyler Williams 84 y.o. male Nutrition Note Spoke with pt. Nutrition plan and goals reviewed with pt. Pt is following heart healthy diet. Pt wants to lose wt. Pt has not actively been trying to lose wt. Wt loss tips reviewed (label reading, how to build a healthy plate, portion sizes, eating frequently across the day). Set goal with patient to decrease intake of fried foods and foods eaten away from home. Patient resistant to dietitan recommendations today. Discussed with patient that these small changes/recommendations can aide in a small amount of weight loss, but if he is unwilling to make any changes at this time, that is okay.    Pt with dx of CHF. Per discussion, pt does use canned/convenience foods often. Pt does add salt to food. Pt does eat out frequently. Reviewed the role of sodium in heart disease with patient. Pt expressed understanding of the information reviewed. Pt aware of nutrition education classes offered and would like to attend nutrition classes.  Lab Results  Component Value Date   HGBA1C 5.0 06/09/2016    Wt Readings from Last 3 Encounters:  01/02/18 195 lb 12.8 oz (88.8 kg)  12/13/17 195 lb 15.8 oz (88.9 kg)  09/25/17 186 lb (84.4 kg)    Nutrition Diagnosis  Food-and nutrition-related knowledge deficit related to lack of exposure to information as related to diagnosis of: ? CVD ? CHF  Overweight  related to excessive energy intake as evidenced by a BMI of 28.29  Excessive sodium intake related to over consumption of processed food as evidenced by frequent consumption of convenience food/ canned vegetables and eating out frequently.  Nutrition Intervention ? Pt's individual nutrition plan reviewed with pt. ? Benefits of adopting Heart Healthy diet discussed when [employer] reviewed.   Goal(s)  Pt to identify and limit food sources of saturated fat, trans fat, refined carbohydrates and sodium  Pt to eat a variety of non-starchy vegetables  Pt to make  better choices when eating out  Pt to decrease intake of fried foods  Plan:   Pt to attend nutrition classes ? Nutrition I ? Nutrition II ? Portion Distortion   Will provide client-centered nutrition education as part of interdisciplinary care  Monitor and evaluate progress toward nutrition goal with team.    Laurina Bustle, MS, RD, LDN 01/09/2018 2:11 PM

## 2018-01-11 ENCOUNTER — Encounter (HOSPITAL_COMMUNITY): Payer: Medicare Other

## 2018-01-11 ENCOUNTER — Ambulatory Visit (HOSPITAL_COMMUNITY): Payer: Medicare Other

## 2018-01-14 ENCOUNTER — Ambulatory Visit (HOSPITAL_COMMUNITY): Payer: Medicare Other

## 2018-01-14 ENCOUNTER — Encounter (HOSPITAL_COMMUNITY)
Admission: RE | Admit: 2018-01-14 | Discharge: 2018-01-14 | Disposition: A | Discharge status: Home / Self Care | Payer: Medicare Other | Source: Ambulatory Visit | Attending: Cardiovascular Disease | Admitting: Cardiovascular Disease

## 2018-01-14 DIAGNOSIS — Z9861 Coronary angioplasty status: Secondary | ICD-10-CM | POA: Diagnosis not present

## 2018-01-15 DIAGNOSIS — K5900 Constipation, unspecified: Secondary | ICD-10-CM | POA: Insufficient documentation

## 2018-01-16 ENCOUNTER — Ambulatory Visit (HOSPITAL_COMMUNITY): Payer: Medicare Other

## 2018-01-16 ENCOUNTER — Telehealth: Payer: Self-pay | Admitting: *Deleted

## 2018-01-16 ENCOUNTER — Encounter (HOSPITAL_COMMUNITY)
Admission: RE | Admit: 2018-01-16 | Discharge: 2018-01-16 | Disposition: A | Discharge status: Home / Self Care | Payer: Medicare Other | Source: Ambulatory Visit | Attending: Cardiovascular Disease | Admitting: Cardiovascular Disease

## 2018-01-16 DIAGNOSIS — Z9861 Coronary angioplasty status: Secondary | ICD-10-CM | POA: Diagnosis not present

## 2018-01-16 NOTE — Telephone Encounter (Signed)
   [facility] Pre-operative Risk Assessment    Request for surgical clearance:  1. What type of surgery is being performed? COLONSCOPY   2. When is this surgery scheduled?  02/12/2018   3. What type of clearance is required (medical clearance vs. Pharmacy clearance to hold med vs. Both)?  BOTH  4. Are there any medications that need to be held prior to surgery and how long? WARFARIN   5. Practice name and name of physician performing surgery?  [facility] DR. JEFFREY MEDOFF   6. What is your office phone number [phone]    7.   What is your office fax number [phone]  8.   Anesthesia type (None, local, MAC, general) ?   OFFICE WILL CALL BACK WITH INFO   Tyler Williams 01/16/2018, 10:19 AM  _________________________________________________________________   (provider comments below)

## 2018-01-16 NOTE — Telephone Encounter (Signed)
Tyler Williams called she said the type of anesthesia being used is propofol.

## 2018-01-18 ENCOUNTER — Ambulatory Visit (HOSPITAL_COMMUNITY): Payer: Medicare Other

## 2018-01-18 ENCOUNTER — Encounter (HOSPITAL_COMMUNITY)
Admission: RE | Admit: 2018-01-18 | Discharge: 2018-01-18 | Disposition: A | Discharge status: Home / Self Care | Payer: Medicare Other | Source: Ambulatory Visit | Attending: Cardiovascular Disease | Admitting: Cardiovascular Disease

## 2018-01-18 DIAGNOSIS — Z9861 Coronary angioplasty status: Secondary | ICD-10-CM

## 2018-01-18 NOTE — Telephone Encounter (Signed)
Patient with diagnosis of Afib on warfarin for anticoagulation.    Procedure: colonoscopy Date of procedure: 02/12/18  CHADS2-VASc score of  4 (CHF, HTN, AGE, DM2, stroke/tia x 2, CAD, AGE, male)  Per office protocol, patient can hold warfarin for 5 days prior to procedure.   Patient will not need bridging with Lovenox (enoxaparin) around procedure.   Will need to reschedule warfarin visit.

## 2018-01-18 NOTE — Telephone Encounter (Signed)
   Primary Cardiologist: Sherren Mocha, MD  Chart reviewed as part of pre-operative protocol coverage. Patient was contacted 01/18/2018 in reference to pre-operative risk assessment for pending surgery as outlined below.  Tyler Williams was last seen on 01/02/2018 by Dr. Burt Knack.  Since that day, Tyler Williams has done well. He has been exercising at cardiac rehab with no exertional symptoms.  Therefore, based on ACC/AHA guidelines, the patient would be at acceptable risk for the planned procedure without further cardiovascular testing.   Per our pharmacy protocol:        Patient with diagnosis of Afib on warfarin for anticoagulation.    Procedure: colonoscopy Date of procedure: 02/12/18  CHADS2-VASc score of  4 (CHF, HTN, AGE, DM2, stroke/tia x 2, CAD, AGE, male)  Per office protocol, patient can hold warfarin for 5 days prior to procedure.   Patient will not need bridging with Lovenox (enoxaparin) around procedure.  No antibiotic needed for colonoscopy per pharmacist.        I will route this recommendation to the requesting party via Epic fax function and remove from pre-op pool.  Please call with questions.  Daune Perch, NP 01/18/2018, 4:19 PM

## 2018-01-21 ENCOUNTER — Encounter (HOSPITAL_COMMUNITY)
Admission: RE | Admit: 2018-01-21 | Discharge: 2018-01-21 | Disposition: A | Discharge status: Home / Self Care | Payer: Medicare Other | Source: Ambulatory Visit | Attending: Cardiovascular Disease | Admitting: Cardiovascular Disease

## 2018-01-21 ENCOUNTER — Ambulatory Visit (HOSPITAL_COMMUNITY): Payer: Medicare Other

## 2018-01-21 DIAGNOSIS — Z9861 Coronary angioplasty status: Secondary | ICD-10-CM | POA: Diagnosis not present

## 2018-01-23 ENCOUNTER — Encounter (HOSPITAL_COMMUNITY)
Admission: RE | Admit: 2018-01-23 | Discharge: 2018-01-23 | Disposition: A | Discharge status: Home / Self Care | Payer: Medicare Other | Source: Ambulatory Visit | Attending: Cardiovascular Disease | Admitting: Cardiovascular Disease

## 2018-01-23 ENCOUNTER — Ambulatory Visit (HOSPITAL_COMMUNITY): Payer: Medicare Other

## 2018-01-23 ENCOUNTER — Encounter (HOSPITAL_COMMUNITY): Payer: Self-pay

## 2018-01-23 DIAGNOSIS — Z9861 Coronary angioplasty status: Secondary | ICD-10-CM | POA: Diagnosis not present

## 2018-01-23 NOTE — Progress Notes (Addendum)
Cardiac Individual Treatment Plan  Patient Details  Name: Tyler Williams MRN: [id number] Date of Birth: 12/06/1926 Referring Provider:   Flowsheet Row CARDIAC REHAB PHASE II ORIENTATION from 12/13/2017 in [place]  Referring Provider  Dr. Burt Knack       Initial Encounter Date:  Flowsheet Row CARDIAC REHAB PHASE II ORIENTATION from 12/13/2017 in [place]  Date  12/12/17      Visit Diagnosis: S/P PTCA (percutaneous transluminal coronary angioplasty)  Patient's Home Medications on Admission:  Current Outpatient Medications:  .  amLODipine (NORVASC) 10 MG tablet, Take 10 mg by mouth daily., Disp: , Rfl:  .  aspirin EC 81 MG tablet, Take 81 mg by mouth daily., Disp: , Rfl:  .  atorvastatin (LIPITOR) 20 MG tablet, Take 20 mg by mouth every evening., Disp: , Rfl:  .  Calcium-Magnesium-Zinc (CAL-MAG-ZINC PO), Take 1 tablet by mouth daily., Disp: , Rfl:  .  Cholecalciferol (VITAMIN D3) 5000 units TABS, Take 1 tablet by mouth daily., Disp: , Rfl:  .  Coenzyme Q10 (COQ-10) 10 MG CAPS, Take 10 mg by mouth daily., Disp: , Rfl:  .  fesoterodine (TOVIAZ) 8 MG TB24 tablet, Take 8 mg by mouth every evening., Disp: , Rfl:  .  furosemide (LASIX) 40 MG tablet, Take 40 mg by mouth daily., Disp: , Rfl:  .  isosorbide mononitrate (IMDUR) 60 MG 24 hr tablet, Take 90 mg by mouth daily., Disp: , Rfl:  .  LORazepam (ATIVAN) 1 MG tablet, Take 1 mg by mouth at bedtime., Disp: , Rfl:  .  metoprolol succinate (TOPROL-XL) 25 MG 24 hr tablet, Take 1 tablet (25 mg total) by mouth daily., Disp: 90 tablet, Rfl: 3 .  multivitamin-lutein (OCUVITE-LUTEIN) CAPS capsule, Take 1 capsule by mouth daily., Disp: , Rfl:  .  nitroGLYCERIN (NITROSTAT) 0.4 MG SL tablet, Place 0.4 mg under the tongue every 5 (five) minutes as needed for chest pain., Disp: , Rfl:  .  omega-3 acid ethyl esters (LOVAZA) 1 G capsule, Take 1 g by mouth daily., Disp: , Rfl:  .   Omega-3 Fatty Acids (FISH OIL PO), Take 1 tablet by mouth daily., Disp: , Rfl:  .  ramipril (ALTACE) 10 MG capsule, Take 10 mg by mouth 2 (two) times daily., Disp: , Rfl:  .  ranibizumab (LUCENTIS) 0.5 MG/0.05ML SOLN, 0.5 mg by Intravitreal route See admin instructions. Every 9 weeks, Disp: , Rfl:  .  warfarin (COUMADIN) 5 MG tablet, Take 5-7.5 mg by mouth See admin instructions. Take 1 and 1/2 tablets on Monday then take 1 tablet all the other days, Disp: , Rfl:   Past Medical History: Past Medical History:  Diagnosis Date  . A-fib ([employer])   . AAA (abdominal aortic aneurysm) ([place])    s/p repair  . Aortic stenosis, mild    a. mean gradient 17 mmHg on 04/2012 TTE  . Arthritis    knees  . CAD (coronary artery disease)    a. s/p multiple percutaneous prior coronary artery interventions b. 05/2012: unsuccessful PCI to tight RCA->medically managed  . [facility])    skin cancer removed, right hand, left leg, chest  . Carotid artery disease ([place])    0-39% bilateral ICA stenoses 11/2011  . Chronic anticoagulation    Coumadin  . Coronary artery disease   . GERD (gastroesophageal reflux disease)   . GERD (gastroesophageal reflux disease)   . History of pulmonary embolism    1964 related  to dislocated hip on right   . History of skin cancer   . Hypercholesteremia   . Hypertension   . Incidental cecal mass noted on CT imaging 05/24/2016   **An incidental finding of potential clinical significance has been found. 4.6 x 5.6 x 3.7 cm masslike lesion in the cecum adjacent to the ileocecal valve may represent a large polyp or colonic neoplasm. Correlation with nonemergent colonoscopy is strongly recommended in the near future to better evaluate this finding.**  . Macular degeneration    eye injections  . Microscopic hematuria    Followed by urology  . Permanent atrial fibrillation   . PVD (peripheral vascular disease) ([place])    Bilateral renal artery atherosclerosis, SMA stenosis with  collateralization  . RBBB    Chronic  . 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 SSS (sick sinus syndrome) ([facility])     Tobacco Use: Social History   Tobacco Use  Smoking Status Never Smoker  Smokeless Tobacco Never Used    Labs: Recent Review Flowsheet Data    Labs for ITP Cardiac and Pulmonary Rehab Latest Ref Rng & Units 06/13/2016 06/13/2016 06/13/2016 06/13/2016 08/28/2017   Cholestrol 0 - 200 mg/dL - - - - 149   LDLCALC 0 - 99 mg/dL - - - - 78   HDL >40 mg/dL - - - - 52   Trlycerides <150 mg/dL - - - - 95   Hemoglobin A1c 4.8 - 5.6 % - - - - -   PHART 7.350 - 7.450 - - - 7.347(L) -   PCO2ART 32.0 - 48.0 mmHg - - - 40.6 -   HCO3 20.0 - 28.0 mmol/L - - - 22.5 -   TCO2 0 - 100 mmol/L 27 25 25 24  -   ACIDBASEDEF 0.0 - 2.0 mmol/L - - - 3.0(H) -   O2SAT % - - - 95.0 -      Capillary Blood Glucose: No results found for: GLUCAP   Exercise Target Goals: Exercise Program Goal: Individual exercise prescription set using results from initial 6 min walk test and THRR while considering  patient's activity barriers and safety.   Exercise Prescription Goal: Initial exercise prescription builds to 30-45 minutes a day of aerobic activity, 2-3 days per week.  Home exercise guidelines will be given to patient during program as part of exercise prescription that the participant will acknowledge.  Activity Barriers & Risk Stratification: Activity Barriers & Cardiac Risk Stratification - 12/13/17 1037    Activity Barriers & Cardiac Risk Stratification          Activity Barriers  Deconditioning    Cardiac Risk Stratification  High           6 Minute Walk: 6 Minute Walk    6 Minute Walk    Row Name 12/13/17 1036   Phase  Initial   Distance  1105 feet   Walk Time  6 minutes   # of Rest Breaks  0   MPH  2.09   METS  1.17   RPE  12   VO2 Peak  4.09   Symptoms   No   Resting HR  56 bpm   Resting BP  124/60   Resting Oxygen Saturation   95 %   Exercise Oxygen Saturation  during 6 min walk  94 %   Max Ex. HR  71 bpm   Max Ex.  BP  140/80   2 Minute Post BP  126/53          Oxygen Initial Assessment:   Oxygen Re-Evaluation:   Oxygen Discharge (Final Oxygen Re-Evaluation):   Initial Exercise Prescription: Initial Exercise Prescription - 12/13/17 1100    Date of Initial Exercise RX and Referring Provider          Date  12/12/17    Referring Provider  Dr. Burt Knack     Expected Discharge Date  03/22/18        NuStep          Level  2    SPM  75    Minutes  10    METs  2        Arm Ergometer          Level  2    Watts  20    Minutes  10    METs  2.1        Track          Laps  7    Minutes  10    METs  2.23        Prescription Details          Frequency (times per week)  3    Duration  Progress to 30 minutes of continuous aerobic without signs/symptoms of physical distress        Intensity          THRR 40-80% of Max Heartrate  52-104    Ratings of Perceived Exertion  11-13        Progression          Progression  Continue to progress workloads to maintain intensity without signs/symptoms of physical distress.        Resistance Training          Training Prescription  Yes    Weight  2 lbs    Reps  10-15           Perform Capillary Blood Glucose checks as needed.  Exercise Prescription Changes: Exercise Prescription Changes    Response to Exercise    Row Name 01/01/18 1536 01/04/18 1435 01/18/18 1000   Blood Pressure (Admit)  132/60  130/70  138/70   Blood Pressure (Exercise)  140/70  148/64  128/70   Blood Pressure (Exit)  142/68  126/60  120/70   Heart Rate (Admit)  72 bpm  78 bpm  53 bpm   Heart Rate (Exercise)  87 bpm  96 bpm  72 bpm   Heart Rate (Exit)  65 bpm  66 bpm  56 bpm   Rating of Perceived Exertion (Exercise)  11  12  10    Duration  Continue with 30 min of aerobic exercise without  signs/symptoms of physical distress.  Continue with 30 min of aerobic exercise without signs/symptoms of physical distress.  Continue with 30 min of aerobic exercise without signs/symptoms of physical distress.   Intensity  THRR New  THRR New  THRR New       Progression    Row Name 01/01/18 1536 01/04/18 1435 01/18/18 1000   Progression  Continue to progress workloads to maintain intensity without signs/symptoms of physical distress.  Continue to progress workloads to maintain intensity without signs/symptoms of physical distress.  Continue to progress workloads to maintain intensity without signs/symptoms of physical distress.   Average METs  2.2  2.2  2.3       Resistance Training    Row  Name 01/01/18 1536 01/04/18 1435 01/18/18 1000   Training Prescription  Yes  Yes  No   Weight  2 lbs  2 lbs  no documentation   Reps  10-15  10-15  no documentation   Time  10 Minutes  10 Minutes  no documentation       NuStep    Row Name 01/01/18 1536 01/04/18 1435 01/18/18 1000   Level  2  2  5    SPM  75  75  75   Minutes  10  10  10    METs  2  1.7  2.4       Arm Ergometer    Row Name 01/01/18 1536 01/04/18 1435 01/18/18 1000   Level  2  2  2    Watts  20  20  20    Minutes  10  10  10    METs  2.17  2.4  2.23       Track    Row Name 01/01/18 1536 01/04/18 1435 01/18/18 1000   Laps  9  7  7    Minutes  10  10  10    METs  2.22  2.22  2.22       Home Exercise Plan    [place] Name 01/01/18 1536 01/04/18 1435 01/18/18 1000   Plans to continue exercise at  no documentation  Home (comment) Walking 2-3 days for 30 minutes per day.   Home (comment) Walking 2-3 days for 30 minutes per day.    Frequency  no documentation  Add 3 additional days to program exercise sessions.  Add 3 additional days to program exercise sessions.   Initial Home Exercises Provided  no documentation  01/04/18  01/04/18          Exercise Comments: Exercise Comments    Row Name 01/02/18 1445 01/04/18 1439 01/23/18 1031    Exercise Comments  Reviewed METs and goals with Pt. Pt will continue exercise at home in additoin to cardiac rehab.   Reviewed HEP with Pt. Responded well.   Reviewed METs and goals with Pt. Pt was responsive and understood goals.       Exercise Goals and Review: Exercise Goals    Exercise Goals    Row Name 12/13/17 1038   Increase Physical Activity  Yes   Intervention  Develop an individualized exercise prescription for aerobic and resistive training based on initial evaluation findings, risk stratification, comorbidities and participant's personal goals.;Provide advice, education, support and counseling about physical activity/exercise needs.   Expected Outcomes  Short Term: Attend rehab on a regular basis to increase amount of physical activity.   Increase Strength and Stamina  Yes   Intervention  Provide advice, education, support and counseling about physical activity/exercise needs.;Develop an individualized exercise prescription for aerobic and resistive training based on initial evaluation findings, risk stratification, comorbidities and participant's personal goals.   Expected Outcomes  Short Term: Increase workloads from initial exercise prescription for resistance, speed, and METs.   Able to understand and use rate of perceived exertion (RPE) scale  Yes   Intervention  Provide education and explanation on how to use RPE scale   Expected Outcomes  Short Term: Able to use RPE daily in rehab to express subjective intensity level;Long Term:  Able to use RPE to guide intensity level when exercising independently   Knowledge and understanding of Target Heart Rate Range (THRR)  Yes   Intervention  Provide education and explanation of THRR including how the numbers were  predicted and where they are located for reference   Expected Outcomes  Short Term: Able to state/look up THRR;Long Term: Able to use THRR to govern intensity when exercising independently;Short Term: Able to use daily as  guideline for intensity in rehab   Able to check pulse independently  Yes   Intervention  Provide education and demonstration on how to check pulse in carotid and radial arteries.;Review the importance of being able to check your own pulse for safety during independent exercise   Expected Outcomes  Short Term: Able to explain why pulse checking is important during independent exercise;Long Term: Able to check pulse independently and accurately   Understanding of Exercise Prescription  Yes   Intervention  Provide education, explanation, and written materials on patient's individual exercise prescription   Expected Outcomes  Short Term: Able to explain program exercise prescription;Long Term: Able to explain home exercise prescription to exercise independently          Exercise Goals Re-Evaluation : Exercise Goals Re-Evaluation    Exercise Goal Re-Evaluation    Row Name 01/02/18 1443 01/04/18 1437 01/23/18 1030   Exercise Goals Review  Increase Physical Activity;Able to understand and use rate of perceived exertion (RPE) scale;Knowledge and understanding of Target Heart Rate Range (THRR);Understanding of Exercise Prescription;Increase Strength and Stamina;Able to check pulse independently  Increase Physical Activity;Increase Strength and Stamina;Able to check pulse independently;Understanding of Exercise Prescription;Knowledge and understanding of Target Heart Rate Range (THRR);Able to understand and use rate of perceived exertion (RPE) scale  Increase Physical Activity;Increase Strength and Stamina;Able to check pulse independently;Understanding of Exercise Prescription;Knowledge and understanding of Target Heart Rate Range (THRR);Able to understand and use rate of perceived exertion (RPE) scale   Comments  Reviewed METs and goals with Pt. Pt MET level is 2.2. Pt is biking 15 minutes 3 days per week in addition to cardiac rehab.   Reviewed HEP with Pt. Pt responded well to goals, exercise  precautions, tmeperature regulations, and THRR. Pt will exercise 2-3 days per week for 30-45 minutes per day in addition to cardiac rehab.   Reviewed METs and goals with Pt. MET level is 2.3. Pt stated he is not exercising much at home. Recommended Pt to increase exercise at home to 2-3 days for 30-45 minutes in addition to Cardiac Rehab.    Expected Outcomes  Will continue to monitor and progress Pt as tolerated.   Will monitor and progress Pt as tolerated.   Will monitor and progress Pt as tolerated.           Discharge Exercise Prescription (Final Exercise Prescription Changes): Exercise Prescription Changes - 01/18/18 1000    Response to Exercise          Blood Pressure (Admit)  138/70    Blood Pressure (Exercise)  128/70    Blood Pressure (Exit)  120/70    Heart Rate (Admit)  53 bpm    Heart Rate (Exercise)  72 bpm    Heart Rate (Exit)  56 bpm    Rating of Perceived Exertion (Exercise)  10    Duration  Continue with 30 min of aerobic exercise without signs/symptoms of physical distress.    Intensity  THRR New        Progression          Progression  Continue to progress workloads to maintain intensity without signs/symptoms of physical distress.    Average METs  2.3        Resistance Training  Training Prescription  No        NuStep          Level  5    SPM  75    Minutes  10    METs  2.4        Arm Ergometer          Level  2    Watts  20    Minutes  10    METs  2.23        Track          Laps  7    Minutes  10    METs  2.22        Home Exercise Plan          Plans to continue exercise at  Home (comment)   Walking 2-3 days for 30 minutes per day.    Frequency  Add 3 additional days to program exercise sessions.    Initial Home Exercises Provided  01/04/18           Nutrition:  Target Goals: Understanding of nutrition guidelines, daily intake of sodium <1560m, cholesterol <2078m calories 30% from fat and 7% or less from saturated  fats, daily to have 5 or more servings of fruits and vegetables.  Biometrics: Pre Biometrics - 12/13/17 1038    Pre Biometrics          Height  5' 8"  (1.727 m)    Weight  88.9 kg    Waist Circumference  41 inches    Hip Circumference  40 inches    Waist to Hip Ratio  1.02 %    BMI (Calculated)  29.81    Triceps Skinfold  19 mm    % Body Fat  30.5 %    Grip Strength  30 kg    Flexibility  7 in    Single Leg Stand  0 seconds            Nutrition Therapy Plan and Nutrition Goals: Nutrition Therapy & Goals - 12/13/17 0947    Nutrition Therapy          Diet  heart healthy        Personal Nutrition Goals          Nutrition Goal  Pt to identify and limit food sources of saturated fat, trans fat, refined carbohydrates and sodium    Personal Goal #2  Pt to eat a variety of non-starchy vegetables    Personal Goal #3  Pt to make better choices when eating out    Personal Goal #4  Pt to decrease intake of fried foods        Intervention Plan          Intervention  Prescribe, educate and counsel regarding individualized specific dietary modifications aiming towards targeted core components such as weight, hypertension, lipid management, diabetes, heart failure and other comorbidities.    Expected Outcomes  Short Term Goal: Understand basic principles of dietary content, such as calories, fat, sodium, cholesterol and nutrients.;Long Term Goal: Adherence to prescribed nutrition plan.           Nutrition Assessments: Nutrition Assessments - 12/13/17 0949    MEDFICTS Scores          Pre Score  69           Nutrition Goals Re-Evaluation:   Nutrition Goals Re-Evaluation:   Nutrition Goals Discharge (Final Nutrition Goals Re-Evaluation):   Psychosocial: Target Goals: Acknowledge presence or absence of  significant depression and/or stress, maximize coping skills, provide positive support system. Participant is able to verbalize types and ability to use techniques and  skills needed for reducing stress and depression.  Initial Review & Psychosocial Screening: Initial Psych Review & Screening - 12/13/17 1122    Initial Review          Current issues with  None Identified        Family Dynamics          Good Support System?  Yes   family        Barriers          Psychosocial barriers to participate in program  There are no identifiable barriers or psychosocial needs.        Screening Interventions          Interventions  Encouraged to exercise           Quality of Life Scores: Quality of Life - 12/13/17 1048    Quality of Life          Select  Quality of Life        Quality of Life Scores          Health/Function Pre  27.6 %    Socioeconomic Pre  30 %    Psych/Spiritual Pre  29.14 %    Family Pre  28.8 %    GLOBAL Pre  28.63 %          Scores of 19 and below usually indicate a poorer quality of life in these areas.  A difference of  2-3 points is a clinically meaningful difference.  A difference of 2-3 points in the total score of the Quality of Life Index has been associated with significant improvement in overall quality of life, self-image, physical symptoms, and general health in studies assessing change in quality of life.  PHQ-9: Recent Review Flowsheet Data    Depression screen [facility] 2/9 12/17/2017   Decreased Interest 0   Down, Depressed, Hopeless 0   PHQ - 2 Score 0     Interpretation of Total Score  Total Score Depression Severity:  1-4 = Minimal depression, 5-9 = Mild depression, 10-14 = Moderate depression, 15-19 = Moderately severe depression, 20-27 = Severe depression   Psychosocial Evaluation and Intervention: Psychosocial Evaluation - 12/17/17 1420    Psychosocial Evaluation & Interventions          Interventions  Encouraged to exercise with the program and follow exercise prescription    Comments  no psychosocial needs identified, no interventions necessary. pt unable to participate in former hobbies due  to vision loss. pt currently enjoys container gardening.  pt has very supportive family.    Expected Outcomes  pt will exhibit positive outlook with good coping skills.     Continue Psychosocial Services   No Follow up required           Psychosocial Re-Evaluation: Psychosocial Re-Evaluation    Psychosocial Re-Evaluation    [place] Name 12/26/17 1436 01/03/18 1209 01/18/18 1526   Current issues with  None Identified  None Identified  None Identified   Comments  no psychosocial needs identified, no interventions necessary   no psychosocial needs identified, no interventions necessary   no psychosocial needs identified, no interventions necessary    Expected Outcomes  pt will exhibit positive outlook with good coping skills.   pt will exhibit positive outlook with good coping skills.   pt will exhibit positive outlook with good coping skills.  Interventions  Encouraged to attend Cardiac Rehabilitation for the exercise  Encouraged to attend Cardiac Rehabilitation for the exercise  Encouraged to attend Cardiac Rehabilitation for the exercise   Continue Psychosocial Services   No Follow up required  No Follow up required  No Follow up required          Psychosocial Discharge (Final Psychosocial Re-Evaluation): Psychosocial Re-Evaluation - 01/18/18 1526    Psychosocial Re-Evaluation          Current issues with  None Identified    Comments  no psychosocial needs identified, no interventions necessary     Expected Outcomes  pt will exhibit positive outlook with good coping skills.     Interventions  Encouraged to attend Cardiac Rehabilitation for the exercise    Continue Psychosocial Services   No Follow up required           Vocational Rehabilitation: Provide vocational rehab assistance to qualifying candidates.   Vocational Rehab Evaluation & Intervention: Vocational Rehab - 12/13/17 1123    Initial Vocational Rehab Evaluation & Intervention          Assessment shows need for  Vocational Rehabilitation  No   retired Marketing executive          Education: Education Goals: Education classes will be provided on a weekly basis, covering required topics. Participant will state understanding/return demonstration of topics presented.  Learning Barriers/Preferences: Learning Barriers/Preferences - 12/12/17 1521    Learning Barriers/Preferences          Learning Barriers  Sight   macular degeneration   Learning Preferences  Written Material;Verbal Instruction           Education Topics: Count Your Pulse:  -Group instruction provided by verbal instruction, demonstration, patient participation and written materials to support subject.  Instructors address importance of being able to find your pulse and how to count your pulse when at home without a heart monitor.  Patients get hands on experience counting their pulse with staff help and individually.   Heart Attack, Angina, and Risk Factor Modification:  -Group instruction provided by verbal instruction, video, and written materials to support subject.  Instructors address signs and symptoms of angina and heart attacks.    Also discuss risk factors for heart disease and how to make changes to improve heart health risk factors.   Functional Fitness:  -Group instruction provided by verbal instruction, demonstration, patient participation, and written materials to support subject.  Instructors address safety measures for doing things around the house.  Discuss how to get up and down off the floor, how to pick things up properly, how to safely get out of a chair without assistance, and balance training.   Meditation and Mindfulness:  -Group instruction provided by verbal instruction, patient participation, and written materials to support subject.  Instructor addresses importance of mindfulness and meditation practice to help reduce stress and improve awareness.  Instructor also leads participants through a meditation  exercise.    Stretching for Flexibility and Mobility:  -Group instruction provided by verbal instruction, patient participation, and written materials to support subject.  Instructors lead participants through series of stretches that are designed to increase flexibility thus improving mobility.  These stretches are additional exercise for major muscle groups that are typically performed during regular warm up and cool down.   Hands Only CPR:  -Group verbal, video, and participation provides a basic overview of AHA guidelines for community CPR. Role-play of emergencies allow participants the opportunity to practice calling for help  and chest compression technique with discussion of AED use.   Hypertension: -Group verbal and written instruction that provides a basic overview of hypertension including the most recent diagnostic guidelines, risk factor reduction with self-care instructions and medication management.    Nutrition I class: Heart Healthy Eating:  -Group instruction provided by PowerPoint slides, verbal discussion, and written materials to support subject matter. The instructor gives an explanation and review of the Therapeutic Lifestyle Changes diet recommendations, which includes a discussion on lipid goals, dietary fat, sodium, fiber, plant stanol/sterol esters, sugar, and the components of a well-balanced, healthy diet.   Nutrition II class: Lifestyle Skills:  -Group instruction provided by PowerPoint slides, verbal discussion, and written materials to support subject matter. The instructor gives an explanation and review of label reading, grocery shopping for heart health, heart healthy recipe modifications, and ways to make healthier choices when eating out.   Diabetes Question & Answer:  -Group instruction provided by PowerPoint slides, verbal discussion, and written materials to support subject matter. The instructor gives an explanation and review of diabetes  co-morbidities, pre- and post-prandial blood glucose goals, pre-exercise blood glucose goals, signs, symptoms, and treatment of hypoglycemia and hyperglycemia, and foot care basics.   Diabetes Blitz:  -Group instruction provided by PowerPoint slides, verbal discussion, and written materials to support subject matter. The instructor gives an explanation and review of the physiology behind type 1 and type 2 diabetes, diabetes medications and rational behind using different medications, pre- and post-prandial blood glucose recommendations and Hemoglobin A1c goals, diabetes diet, and exercise including blood glucose guidelines for exercising safely.    Portion Distortion:  -Group instruction provided by PowerPoint slides, verbal discussion, written materials, and food models to support subject matter. The instructor gives an explanation of serving size versus portion size, changes in portions sizes over the last 20 years, and what consists of a serving from each food group.   Stress Management:  -Group instruction provided by verbal instruction, video, and written materials to support subject matter.  Instructors review role of stress in heart disease and how to cope with stress positively.     Exercising on Your Own:  -Group instruction provided by verbal instruction, power point, and written materials to support subject.  Instructors discuss benefits of exercise, components of exercise, frequency and intensity of exercise, and end points for exercise.  Also discuss use of nitroglycerin and activating EMS.  Review options of places to exercise outside of rehab.  Review guidelines for sex with heart disease.   Cardiac Drugs I:  -Group instruction provided by verbal instruction and written materials to support subject.  Instructor reviews cardiac drug classes: antiplatelets, anticoagulants, beta blockers, and statins.  Instructor discusses reasons, side effects, and lifestyle considerations for each  drug class.   Cardiac Drugs II:  -Group instruction provided by verbal instruction and written materials to support subject.  Instructor reviews cardiac drug classes: angiotensin converting enzyme inhibitors (ACE-I), angiotensin II receptor blockers (ARBs), nitrates, and calcium channel blockers.  Instructor discusses reasons, side effects, and lifestyle considerations for each drug class. Flowsheet Row CARDIAC REHAB PHASE II EXERCISE from 01/16/2018 in [place]  Date  01/16/18  Educator  Pharmacist  Instruction Review Code  2- Demonstrated Understanding      Anatomy and Physiology of the Circulatory System:  Group verbal and written instruction and models provide basic cardiac anatomy and physiology, with the coronary electrical and arterial systems. Review of: AMI, Angina, Valve disease, Heart Failure, Peripheral  Artery Disease, Cardiac Arrhythmia, Pacemakers, and the ICD.   Other Education:  -Group or individual verbal, written, or video instructions that support the educational goals of the cardiac rehab program.   Holiday Eating Survival Tips:  -Group instruction provided by PowerPoint slides, verbal discussion, and written materials to support subject matter. The instructor gives patients tips, tricks, and techniques to help them not only survive but enjoy the holidays despite the onslaught of food that accompanies the holidays.   Knowledge Questionnaire Score: Knowledge Questionnaire Score - 12/13/17 1039    Knowledge Questionnaire Score          Pre Score  14/24           Core Components/Risk Factors/Patient Goals at Admission: Personal Goals and Risk Factors at Admission - 12/13/17 1053    Core Components/Risk Factors/Patient Goals on Admission           Weight Management  Yes;Weight Maintenance;Weight Loss    Admit Weight  195 lb 15.8 oz (88.9 kg)    Expected Outcomes  Short Term: Continue to assess and modify interventions until  short term weight is achieved;Long Term: Adherence to nutrition and physical activity/exercise program aimed toward attainment of established weight goal;Weight Maintenance: Understanding of the daily nutrition guidelines, which includes 25-35% calories from fat, 7% or less cal from saturated fats, less than 251m cholesterol, less than 1.5gm of sodium, & 5 or more servings of fruits and vegetables daily;Weight Loss: Understanding of general recommendations for a balanced deficit meal plan, which promotes 1-2 lb weight loss per week and includes a negative energy balance of [phone] kcal/d;Understanding recommendations for meals to include 15-35% energy as protein, 25-35% energy from fat, 35-60% energy from carbohydrates, less than 2044mof dietary cholesterol, 20-35 gm of total fiber daily;Understanding of distribution of calorie intake throughout the day with the consumption of 4-5 meals/snacks    Hypertension  Yes    Intervention  Provide education on lifestyle modifcations including regular physical activity/exercise, weight management, moderate sodium restriction and increased consumption of fresh fruit, vegetables, and low fat dairy, alcohol moderation, and smoking cessation.;Monitor prescription use compliance.    Expected Outcomes  Short Term: Continued assessment and intervention until BP is < 140/9065mG in hypertensive participants. < 130/79m47m in hypertensive participants with diabetes, heart failure or chronic kidney disease.;Long Term: Maintenance of blood pressure at goal levels.    Intervention  Provide education and support for participant on nutrition & aerobic/resistive exercise along with prescribed medications to achieve LDL <70mg54mL >40mg.34mExpected Outcomes  Short Term: Participant states understanding of desired cholesterol values and is compliant with medications prescribed. Participant is following exercise prescription and nutrition guidelines.;Long Term: Cholesterol controlled  with medications as prescribed, with individualized exercise RX and with personalized nutrition plan. Value goals: LDL < 70mg, 5m> 40 mg.           Core Components/Risk Factors/Patient Goals Review:  Goals and Risk Factor Review    Core Components/Risk Factors/Patient Goals Review    Row Name 12/17/17 1421 01/03/18 1210 01/18/18 1527   Personal Goals Review  Weight Management/Obesity;Hypertension;Lipids  Weight Management/Obesity;Hypertension;Lipids  Weight Management/Obesity;Hypertension;Lipids   Review  pt with CAD RF demonstrates eagerness to participate in CR program. pt personal goals are to lose weight and improve overall health/well being. pt would like to restore his recent funcational decline.    pt with CAD RF demonstrates eagerness to participate in CR program. pt personal goals are to lose weight and improve  overall health/well being. pt would like to improve upon his recent funcational decline and restore activity level to baseline prior to cardiac event.   pt with CAD RF demonstrates eagerness to participate in CR program. pt personal goals are to lose weight and improve overall health/well being. pt using stationary bike at home. pt states he enjoys walking and talking at CR.     Expected Outcomes  pt will participate in CR exercise, nutrition and lifestyle modification to decrease overall RF to achieve personal and program goals.   pt will participate in CR exercise, nutrition and lifestyle modification to decrease overall RF to achieve personal and program goals.   pt will participate in CR exercise, nutrition and lifestyle modification to decrease overall RF to achieve personal and program goals.           Core Components/Risk Factors/Patient Goals at Discharge (Final Review):  Goals and Risk Factor Review - 01/18/18 1527    Core Components/Risk Factors/Patient Goals Review          Personal Goals Review  Weight Management/Obesity;Hypertension;Lipids    Review  pt with CAD  RF demonstrates eagerness to participate in CR program. pt personal goals are to lose weight and improve overall health/well being. pt using stationary bike at home. pt states he enjoys walking and talking at CR.      Expected Outcomes  pt will participate in CR exercise, nutrition and lifestyle modification to decrease overall RF to achieve personal and program goals.            ITP Comments: ITP Comments    Row Name 12/12/17 1519 12/13/17 1740 12/17/17 1419 01/03/18 1209 01/18/18 1526   ITP Comments  Dr.Gloriajean Okun Radford Pax, Medical Director   Dr.Emrik Erhard, Medical Director   pt started group exercise. pt tolerated light activity without difficulty. pt oriented to exercise equipment and safety routine.  pt verbalized understanding.   30 day ITP review. pt with good attendance and participation.  pt demonstrates eagerness to partcipate in CR program.   30 day ITP review. pt with good attendance and participation.  pt demonstrates eagerness to partcipate in CR program.       Comments:

## 2018-01-23 NOTE — Telephone Encounter (Signed)
Spoke with pt and gave holding instructions and f/u appt moved.

## 2018-01-24 DIAGNOSIS — H353231 Exudative age-related macular degeneration, bilateral, with active choroidal neovascularization: Secondary | ICD-10-CM | POA: Diagnosis not present

## 2018-01-25 ENCOUNTER — Encounter (HOSPITAL_COMMUNITY)
Admission: RE | Admit: 2018-01-25 | Discharge: 2018-01-25 | Disposition: A | Discharge status: Home / Self Care | Payer: Medicare Other | Source: Ambulatory Visit | Attending: Cardiovascular Disease | Admitting: Cardiovascular Disease

## 2018-01-25 ENCOUNTER — Ambulatory Visit (HOSPITAL_COMMUNITY): Payer: Medicare Other

## 2018-01-25 DIAGNOSIS — Z9861 Coronary angioplasty status: Secondary | ICD-10-CM | POA: Diagnosis not present

## 2018-01-28 ENCOUNTER — Other Ambulatory Visit: Payer: Self-pay | Admitting: Cardiovascular Disease

## 2018-01-28 ENCOUNTER — Encounter (HOSPITAL_COMMUNITY)
Admission: RE | Admit: 2018-01-28 | Discharge: 2018-01-28 | Disposition: A | Discharge status: Home / Self Care | Payer: Medicare Other | Source: Ambulatory Visit | Attending: Cardiovascular Disease | Admitting: Cardiovascular Disease

## 2018-01-28 ENCOUNTER — Ambulatory Visit (HOSPITAL_COMMUNITY): Payer: Medicare Other

## 2018-01-28 DIAGNOSIS — Z9861 Coronary angioplasty status: Secondary | ICD-10-CM | POA: Diagnosis not present

## 2018-01-30 ENCOUNTER — Encounter (HOSPITAL_COMMUNITY)
Admission: RE | Admit: 2018-01-30 | Discharge: 2018-01-30 | Disposition: A | Discharge status: Home / Self Care | Payer: Medicare Other | Source: Ambulatory Visit | Attending: Cardiovascular Disease | Admitting: Cardiovascular Disease

## 2018-01-30 ENCOUNTER — Ambulatory Visit (HOSPITAL_COMMUNITY): Payer: Medicare Other

## 2018-01-30 DIAGNOSIS — Z9861 Coronary angioplasty status: Secondary | ICD-10-CM | POA: Diagnosis not present

## 2018-02-01 ENCOUNTER — Ambulatory Visit (HOSPITAL_COMMUNITY): Payer: Medicare Other

## 2018-02-01 ENCOUNTER — Encounter (HOSPITAL_COMMUNITY)
Admission: RE | Admit: 2018-02-01 | Discharge: 2018-02-01 | Disposition: A | Discharge status: Home / Self Care | Payer: Medicare Other | Source: Ambulatory Visit | Attending: Cardiovascular Disease | Admitting: Cardiovascular Disease

## 2018-02-01 DIAGNOSIS — Z9861 Coronary angioplasty status: Secondary | ICD-10-CM | POA: Diagnosis not present

## 2018-02-04 ENCOUNTER — Encounter (HOSPITAL_COMMUNITY): Admission: RE | Admit: 2018-02-04 | Payer: Medicare Other | Source: Ambulatory Visit

## 2018-02-04 ENCOUNTER — Ambulatory Visit (HOSPITAL_COMMUNITY): Payer: Medicare Other

## 2018-02-07 ENCOUNTER — Telehealth: Payer: Self-pay | Admitting: Cardiovascular Disease

## 2018-02-07 NOTE — Telephone Encounter (Signed)
Called patient back. Patient complaining of swelling in his BLE and penis. Patient stated he took his lasix today. Patient denies SOB or Chest pain. Patient stated his PCP is going to see him tomorrow morning. Informed patient if he develops SOB or CP with his swelling to go to the ED. Informed patient to keep his legs elevated and reduce his salt intake, and keep his appt tomorrow with PCP. Patient verbalized understanding.

## 2018-02-07 NOTE — Telephone Encounter (Signed)
New Message:     Pt said he is having swelling in his private part. Pt says his Scrotum area is swollen. Pt said he is going to his primary doctor tomorrow morning.

## 2018-02-08 ENCOUNTER — Ambulatory Visit
Admission: RE | Admit: 2018-02-08 | Discharge: 2018-02-08 | Disposition: A | Discharge status: Home / Self Care | Payer: Medicare Other | Source: Ambulatory Visit | Attending: Internal Medicine | Admitting: Internal Medicine

## 2018-02-08 ENCOUNTER — Ambulatory Visit (HOSPITAL_COMMUNITY): Payer: Medicare Other

## 2018-02-08 ENCOUNTER — Other Ambulatory Visit: Payer: Self-pay | Admitting: Internal Medicine

## 2018-02-08 ENCOUNTER — Encounter (HOSPITAL_COMMUNITY): Payer: Medicare Other

## 2018-02-08 DIAGNOSIS — R109 Unspecified abdominal pain: Secondary | ICD-10-CM | POA: Diagnosis not present

## 2018-02-08 DIAGNOSIS — N200 Calculus of kidney: Secondary | ICD-10-CM | POA: Diagnosis not present

## 2018-02-08 DIAGNOSIS — R609 Edema, unspecified: Secondary | ICD-10-CM | POA: Diagnosis not present

## 2018-02-08 DIAGNOSIS — K5900 Constipation, unspecified: Secondary | ICD-10-CM

## 2018-02-08 DIAGNOSIS — K7460 Unspecified cirrhosis of liver: Secondary | ICD-10-CM | POA: Diagnosis not present

## 2018-02-08 DIAGNOSIS — I509 Heart failure, unspecified: Secondary | ICD-10-CM | POA: Diagnosis not present

## 2018-02-08 DIAGNOSIS — K4090 Unilateral inguinal hernia, without obstruction or gangrene, not specified as recurrent: Secondary | ICD-10-CM | POA: Diagnosis not present

## 2018-02-08 MED ORDER — IOPAMIDOL (ISOVUE-300) INJECTION 61%
100.0000 mL | Freq: Once | INTRAVENOUS | Status: AC | PRN
  Administered 2018-02-08: 100 mL via INTRAVENOUS

## 2018-02-10 NOTE — Telephone Encounter (Signed)
Not sure if PCP made changes. If so and patient improved, continue those instructions. If continued edema, would increase furosemide from 40 mg daily to BID dosing as his standing dose. Repeat BMET in 1-2 weeks and arrange APP follow-up to evaluate further.

## 2018-02-11 ENCOUNTER — Ambulatory Visit (HOSPITAL_COMMUNITY): Payer: Medicare Other

## 2018-02-11 ENCOUNTER — Encounter (HOSPITAL_COMMUNITY): Payer: Medicare Other

## 2018-02-11 NOTE — Telephone Encounter (Signed)
Spoke with patient, he said that his PCP prescribed Spironolactone 25 mg, daily.

## 2018-02-11 NOTE — Telephone Encounter (Signed)
Spoke with the patient's wife, she stated he went to PCP and they gave him medication, she did not know what medication he was taking. She said that his PCP is handling his concerns. Advised if patient still continues to have problems to let our office know.

## 2018-02-14 ENCOUNTER — Ambulatory Visit (INDEPENDENT_AMBULATORY_CARE_PROVIDER_SITE_OTHER): Payer: Medicare Other | Admitting: Podiatry

## 2018-02-14 DIAGNOSIS — I719 Aortic aneurysm of unspecified site, without rupture: Secondary | ICD-10-CM | POA: Insufficient documentation

## 2018-02-14 DIAGNOSIS — M79674 Pain in right toe(s): Secondary | ICD-10-CM | POA: Diagnosis not present

## 2018-02-14 DIAGNOSIS — M79675 Pain in left toe(s): Secondary | ICD-10-CM | POA: Diagnosis not present

## 2018-02-14 DIAGNOSIS — B351 Tinea unguium: Secondary | ICD-10-CM

## 2018-02-14 DIAGNOSIS — Z9229 Personal history of other drug therapy: Secondary | ICD-10-CM

## 2018-02-14 NOTE — Patient Instructions (Signed)
Onychomycosis/Fungal Toenails  WHAT IS IT? An infection that lies within the keratin of your nail plate that is caused by a fungus.  WHY ME? Fungal infections affect all ages, sexes, races, and creeds.  There may be many factors that predispose you to a fungal infection such as age, coexisting medical conditions such as diabetes, or an autoimmune disease; stress, medications, fatigue, genetics, etc.  Bottom line: fungus thrives in a warm, moist environment and your shoes offer such a location.  IS IT CONTAGIOUS? Theoretically, yes.  You do not want to share shoes, nail clippers or files with someone who has fungal toenails.  Walking around barefoot in the same room or sleeping in the same bed is unlikely to transfer the organism.  It is important to realize, however, that fungus can spread easily from one nail to the next on the same foot.  HOW DO WE TREAT THIS?  There are several ways to treat this condition.  Treatment may depend on many factors such as age, medications, pregnancy, liver and kidney conditions, etc.  It is best to ask your doctor which options are available to you.  1. No treatment.   Unlike many other medical concerns, you can live with this condition.  However for many people this can be a painful condition and may lead to ingrown toenails or a bacterial infection.  It is recommended that you keep the nails cut short to help reduce the amount of fungal nail. 2. Topical treatment.  These range from herbal remedies to prescription strength nail lacquers.  About 40-50% effective, topicals require twice daily application for approximately 9 to 12 months or until an entirely new nail has grown out.  The most effective topicals are medical grade medications available through physicians offices. 3. Oral antifungal medications.  With an 80-90% cure rate, the most common oral medication requires 3 to 4 months of therapy and stays in your system for a year as the new nail grows out.  Oral  antifungal medications do require blood work to make sure it is a safe drug for you.  A liver function panel will be performed prior to starting the medication and after the first month of treatment.  It is important to have the blood work performed to avoid any harmful side effects.  In general, this medication safe but blood work is required. 4. Laser Therapy.  This treatment is performed by applying a specialized laser to the affected nail plate.  This therapy is noninvasive, fast, and non-painful.  It is not covered by insurance and is therefore, out of pocket.  The results have been very good with a 80-95% cure rate.  [facility] is the only practice in the area to offer this therapy. 5. Permanent Nail Avulsion.  Removing the entire nail so that a new nail will not grow back.  Corns and Calluses Corns are small areas of thickened skin that occur on the top, sides, or tip of a toe. They contain a cone-shaped core with a point that can press on a nerve below. This causes pain.  Calluses are areas of thickened skin that can occur anywhere on the body, including the hands, fingers, palms, soles of the feet, and heels. Calluses are usually larger than corns. What are the causes? Corns and calluses are caused by rubbing (friction) or pressure, such as from shoes that are too tight or do not fit properly. What increases the risk? Corns are more likely to develop in people   who have misshapen toes (toe deformities), such as hammer toes. Calluses can occur with friction to any area of the skin. They are more likely to develop in people who:  Work with their hands.  Wear shoes that fit poorly, are too tight, or are high-heeled.  Have toe deformities. What are the signs or symptoms? Symptoms of a corn or callus include:  A hard growth on the skin.  Pain or tenderness under the skin.  Redness and swelling.  Increased discomfort while wearing tight-fitting shoes, if your feet are  affected. If a corn or callus becomes infected, symptoms may include:  Redness and swelling that gets worse.  Pain.  Fluid, blood, or pus draining from the corn or callus. How is this diagnosed? Corns and calluses may be diagnosed based on your symptoms, your medical history, and a physical exam. How is this treated? Treatment for corns and calluses may include:  Removing the cause of the friction or pressure. This may involve: ? Changing your shoes. ? Wearing shoe inserts (orthotics) or other protective layers in your shoes, such as a corn pad. ? Wearing gloves.  Applying medicine to the skin (topical medicine) to help soften skin in the hardened, thickened areas.  Removing layers of dead skin with a file to reduce the size of the corn or callus.  Removing the corn or callus with a scalpel or laser.  Taking antibiotic medicines, if your corn or callus is infected.  Having surgery, if a toe deformity is the cause. Follow these instructions at home:   Take over-the-counter and prescription medicines only as told by your health care provider.  If you were prescribed an antibiotic, take it as told by your health care provider. Do not stop taking it even if your condition starts to improve.  Wear shoes that fit well. Avoid wearing high-heeled shoes and shoes that are too tight or too loose.  Wear any padding, protective layers, gloves, or orthotics as told by your health care provider.  Soak your hands or feet and then use a file or pumice stone to soften your corn or callus. Do this as told by your health care provider.  Check your corn or callus every day for symptoms of infection. Contact a health care provider if you:  Notice that your symptoms do not improve with treatment.  Have redness or swelling that gets worse.  Notice that your corn or callus becomes painful.  Have fluid, blood, or pus coming from your corn or callus.  Have new symptoms. Summary  Corns are  small areas of thickened skin that occur on the top, sides, or tip of a toe.  Calluses are areas of thickened skin that can occur anywhere on the body, including the hands, fingers, palms, and soles of the feet. Calluses are usually larger than corns.  Corns and calluses are caused by rubbing (friction) or pressure, such as from shoes that are too tight or do not fit properly.  Treatment may include wearing any padding, protective layers, gloves, or orthotics as told by your health care provider. This information is not intended to replace advice given to you by your health care provider. Make sure you discuss any questions you have with your health care provider. Document Released: 11/06/2003 Document Revised: 12/13/2016 Document Reviewed: 12/13/2016 [employer]vier Interactive Patient Education  2019 [employer]  

## 2018-02-15 ENCOUNTER — Encounter (HOSPITAL_COMMUNITY)
Admission: RE | Admit: 2018-02-15 | Discharge: 2018-02-15 | Disposition: A | Discharge status: Home / Self Care | Payer: Medicare Other | Source: Ambulatory Visit | Attending: Cardiovascular Disease | Admitting: Cardiovascular Disease

## 2018-02-15 ENCOUNTER — Ambulatory Visit (HOSPITAL_COMMUNITY): Payer: Medicare Other

## 2018-02-15 DIAGNOSIS — Z9861 Coronary angioplasty status: Secondary | ICD-10-CM | POA: Diagnosis not present

## 2018-02-18 ENCOUNTER — Ambulatory Visit (HOSPITAL_COMMUNITY): Payer: Medicare Other

## 2018-02-18 ENCOUNTER — Ambulatory Visit (INDEPENDENT_AMBULATORY_CARE_PROVIDER_SITE_OTHER): Payer: Medicare Other | Admitting: *Deleted

## 2018-02-18 ENCOUNTER — Encounter (HOSPITAL_COMMUNITY)
Admission: RE | Admit: 2018-02-18 | Discharge: 2018-02-18 | Disposition: A | Discharge status: Home / Self Care | Payer: Medicare Other | Source: Ambulatory Visit | Attending: Cardiovascular Disease | Admitting: Cardiovascular Disease

## 2018-02-18 DIAGNOSIS — Z5181 Encounter for therapeutic drug level monitoring: Secondary | ICD-10-CM | POA: Diagnosis not present

## 2018-02-18 DIAGNOSIS — I4891 Unspecified atrial fibrillation: Secondary | ICD-10-CM

## 2018-02-18 DIAGNOSIS — I4821 Permanent atrial fibrillation: Secondary | ICD-10-CM

## 2018-02-18 LAB — POCT INR: INR: 1.9 — AB (ref 2.0–3.0)

## 2018-02-18 NOTE — Patient Instructions (Signed)
Description   Today take 2 tablets then continue taking 1 tablet every day except 1.5 tablets on Mondays.  Recheck INR in 5 weeks.  Call if placed on any new medications or if scheduled for any procedures.  Coumadin clinic [phone].

## 2018-02-20 ENCOUNTER — Ambulatory Visit (HOSPITAL_COMMUNITY): Payer: Medicare Other

## 2018-02-20 ENCOUNTER — Encounter (HOSPITAL_COMMUNITY): Payer: Self-pay

## 2018-02-20 ENCOUNTER — Encounter (HOSPITAL_COMMUNITY)
Admission: RE | Admit: 2018-02-20 | Discharge: 2018-02-20 | Disposition: A | Discharge status: Home / Self Care | Payer: Medicare Other | Source: Ambulatory Visit | Attending: Cardiovascular Disease | Admitting: Cardiovascular Disease

## 2018-02-20 DIAGNOSIS — Z9861 Coronary angioplasty status: Secondary | ICD-10-CM

## 2018-02-20 NOTE — Progress Notes (Addendum)
Cardiac Individual Treatment Plan  Patient Details  Name: Tyler Williams MRN: [id number] Date of Birth: August 15, 1926 Referring Provider:   Flowsheet Row CARDIAC REHAB PHASE II ORIENTATION from 12/13/2017 in [facility]  Referring Provider  Dr. Burt Knack       Initial Encounter Date:  Flowsheet Row CARDIAC REHAB PHASE II ORIENTATION from 12/13/2017 in [facility]  Date  12/12/17      Visit Diagnosis: S/P PTCA (percutaneous transluminal coronary angioplasty)  Patient's Home Medications on Admission:  Current Outpatient Medications:  .  amLODipine (NORVASC) 10 MG tablet, Take 10 mg by mouth daily., Disp: , Rfl:  .  aspirin EC 81 MG tablet, Take 81 mg by mouth daily., Disp: , Rfl:  .  atorvastatin (LIPITOR) 20 MG tablet, TAKE 1 TABLET ONCE DAILY., Disp: 90 tablet, Rfl: 3 .  Calcium-Magnesium-Zinc (CAL-MAG-ZINC PO), Take 1 tablet by mouth daily., Disp: , Rfl:  .  Cholecalciferol (VITAMIN D3) 5000 units TABS, Take 1 tablet by mouth daily., Disp: , Rfl:  .  Coenzyme Q10 (COQ-10) 10 MG CAPS, Take 10 mg by mouth daily., Disp: , Rfl:  .  fesoterodine (TOVIAZ) 8 MG TB24 tablet, Take 8 mg by mouth every evening., Disp: , Rfl:  .  furosemide (LASIX) 40 MG tablet, Take 40 mg by mouth daily., Disp: , Rfl:  .  isosorbide mononitrate (IMDUR) 60 MG 24 hr tablet, Take 90 mg by mouth daily., Disp: , Rfl:  .  LORazepam (ATIVAN) 1 MG tablet, Take 1 mg by mouth at bedtime., Disp: , Rfl:  .  metoprolol succinate (TOPROL-XL) 25 MG 24 hr tablet, Take 1 tablet (25 mg total) by mouth daily., Disp: 90 tablet, Rfl: 3 .  multivitamin-lutein (OCUVITE-LUTEIN) CAPS capsule, Take 1 capsule by mouth daily., Disp: , Rfl:  .  nitroGLYCERIN (NITROSTAT) 0.4 MG SL tablet, Place 0.4 mg under the tongue every 5 (five) minutes as needed for chest pain., Disp: , Rfl:  .  omega-3 acid ethyl esters (LOVAZA) 1 G capsule, Take 1 g by mouth daily., Disp: , Rfl:  .   Omega-3 Fatty Acids (FISH OIL PO), Take 1 tablet by mouth daily., Disp: , Rfl:  .  ramipril (ALTACE) 10 MG capsule, Take 10 mg by mouth 2 (two) times daily., Disp: , Rfl:  .  ranibizumab (LUCENTIS) 0.5 MG/0.05ML SOLN, 0.5 mg by Intravitreal route See admin instructions. Every 9 weeks, Disp: , Rfl:  .  spironolactone (ALDACTONE) 25 MG tablet, Take 25 mg by mouth daily., Disp: , Rfl:  .  warfarin (COUMADIN) 5 MG tablet, Take 5-7.5 mg by mouth See admin instructions. Take 1 and 1/2 tablets on Monday then take 1 tablet all the other days, Disp: , Rfl:   Past Medical History: Past Medical History:  Diagnosis Date  . A-fib ([place])   . AAA (abdominal aortic aneurysm) ([place])    s/p repair  . Aortic stenosis, mild    a. mean gradient 17 mmHg on 04/2012 TTE  . Arthritis    knees  . CAD (coronary artery disease)    a. s/p multiple percutaneous prior coronary artery interventions b. 05/2012: unsuccessful PCI to tight RCA->medically managed  . Cancer [facility])    skin cancer removed, right hand, left leg, chest  . Carotid artery disease ([place])    0-39% bilateral ICA stenoses 11/2011  . Chronic anticoagulation    Coumadin  . Coronary artery disease   . GERD (gastroesophageal reflux disease)   .  GERD (gastroesophageal reflux disease)   . History of pulmonary embolism    1964 related to dislocated hip on right   . History of skin cancer   . Hypercholesteremia   . Hypertension   . Incidental cecal mass noted on CT imaging 05/24/2016   **An incidental finding of potential clinical significance has been found. 4.6 x 5.6 x 3.7 cm masslike lesion in the cecum adjacent to the ileocecal valve may represent a large polyp or colonic neoplasm. Correlation with nonemergent colonoscopy is strongly recommended in the near future to better evaluate this finding.**  . Macular degeneration    eye injections  . Microscopic hematuria    Followed by urology  . Permanent atrial fibrillation   . PVD (peripheral vascular  disease) ([place])    Bilateral renal artery atherosclerosis, SMA stenosis with collateralization  . RBBB    Chronic  . 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 SSS (sick sinus syndrome) ([facility])     Tobacco Use: Social History   Tobacco Use  Smoking Status Never Smoker  Smokeless Tobacco Never Used    Labs: Recent Review Flowsheet Data    Labs for ITP Cardiac and Pulmonary Rehab Latest Ref Rng & Units 06/13/2016 06/13/2016 06/13/2016 06/13/2016 08/28/2017   Cholestrol 0 - 200 mg/dL - - - - 149   LDLCALC 0 - 99 mg/dL - - - - 78   HDL >40 mg/dL - - - - 52   Trlycerides <150 mg/dL - - - - 95   Hemoglobin A1c 4.8 - 5.6 % - - - - -   PHART 7.350 - 7.450 - - - 7.347(L) -   PCO2ART 32.0 - 48.0 mmHg - - - 40.6 -   HCO3 20.0 - 28.0 mmol/L - - - 22.5 -   TCO2 0 - 100 mmol/L 27 25 25 24  -   ACIDBASEDEF 0.0 - 2.0 mmol/L - - - 3.0(H) -   O2SAT % - - - 95.0 -      Capillary Blood Glucose: No results found for: GLUCAP   Exercise Target Goals: Exercise Program Goal: Individual exercise prescription set using results from initial 6 min walk test and THRR while considering  patient's activity barriers and safety.   Exercise Prescription Goal: Initial exercise prescription builds to 30-45 minutes a day of aerobic activity, 2-3 days per week.  Home exercise guidelines will be given to patient during program as part of exercise prescription that the participant will acknowledge.  Activity Barriers & Risk Stratification: Activity Barriers & Cardiac Risk Stratification - 12/13/17 1037    Activity Barriers & Cardiac Risk Stratification          Activity Barriers  Deconditioning    Cardiac Risk Stratification  High           6 Minute Walk: 6 Minute Walk    6 Minute Walk    Row Name 12/13/17 1036   Phase  Initial   Distance  1105 feet   Walk Time  6 minutes   # of Rest  Breaks  0   MPH  2.09   METS  1.17   RPE  12   VO2 Peak  4.09   Symptoms  No   Resting HR  56 bpm   Resting BP  124/60   Resting Oxygen Saturation   95 %   Exercise Oxygen Saturation  during 6 min  walk  94 %   Max Ex. HR  71 bpm   Max Ex. BP  140/80   2 Minute Post BP  126/53          Oxygen Initial Assessment:   Oxygen Re-Evaluation:   Oxygen Discharge (Final Oxygen Re-Evaluation):   Initial Exercise Prescription: Initial Exercise Prescription - 12/13/17 1100    Date of Initial Exercise RX and Referring Provider          Date  12/12/17    Referring Provider  Dr. Burt Knack     Expected Discharge Date  03/22/18        NuStep          Level  2    SPM  75    Minutes  10    METs  2        Arm Ergometer          Level  2    Watts  20    Minutes  10    METs  2.1        Track          Laps  7    Minutes  10    METs  2.23        Prescription Details          Frequency (times per week)  3    Duration  Progress to 30 minutes of continuous aerobic without signs/symptoms of physical distress        Intensity          THRR 40-80% of Max Heartrate  52-104    Ratings of Perceived Exertion  11-13        Progression          Progression  Continue to progress workloads to maintain intensity without signs/symptoms of physical distress.        Resistance Training          Training Prescription  Yes    Weight  2 lbs    Reps  10-15           Perform Capillary Blood Glucose checks as needed.  Exercise Prescription Changes: Exercise Prescription Changes    Response to Exercise    Row Name 01/01/18 1536 01/04/18 1435 01/18/18 1000 02/12/18 1500   Blood Pressure (Admit)  132/60  130/70  138/70  136/60   Blood Pressure (Exercise)  140/70  148/64  128/70  140/60   Blood Pressure (Exit)  142/68  126/60  120/70  148/72   Heart Rate (Admit)  72 bpm  78 bpm  53 bpm  72 bpm   Heart Rate (Exercise)  87 bpm  96 bpm  72 bpm  80 bpm   Heart Rate (Exit)  65 bpm   66 bpm  56 bpm  72 bpm   Rating of Perceived Exertion (Exercise)  11  12  10  10    Duration  Continue with 30 min of aerobic exercise without signs/symptoms of physical distress.  Continue with 30 min of aerobic exercise without signs/symptoms of physical distress.  Continue with 30 min of aerobic exercise without signs/symptoms of physical distress.  Continue with 30 min of aerobic exercise without signs/symptoms of physical distress.   Intensity  THRR New  THRR New  THRR New  THRR New       Progression    Row Name 01/01/18 1536 01/04/18 1435 01/18/18 1000 02/12/18 1500   Progression  Continue to progress workloads to maintain intensity  without signs/symptoms of physical distress.  Continue to progress workloads to maintain intensity without signs/symptoms of physical distress.  Continue to progress workloads to maintain intensity without signs/symptoms of physical distress.  Continue to progress workloads to maintain intensity without signs/symptoms of physical distress.   Average METs  2.2  2.2  2.3  2.7       Resistance Training    Row Name 01/01/18 1536 01/04/18 1435 01/18/18 1000 02/12/18 1500   Training Prescription  Yes  Yes  No  Yes   Weight  2 lbs  2 lbs  no documentation  2 lbs.    Reps  10-15  10-15  no documentation  10-15   Time  10 Minutes  10 Minutes  no documentation  [place] Name 01/01/18 1536 01/04/18 1435 01/18/18 1000 02/12/18 1500   Level  2  2  5  5    SPM  75  75  75  75   Minutes  10  10  10  10    METs  2  1.7  2.4  2.4       Arm Ergometer    Row Name 01/01/18 1536 01/04/18 1435 01/18/18 1000 02/12/18 1500   Level  2  2  2  2    Watts  20  20  20  20    Minutes  10  10  10  10    METs  2.17  2.4  2.23  2.75       Track    Row Name 01/01/18 1536 01/04/18 1435 01/18/18 1000 02/12/18 1500   Laps  9  7  7  13    Minutes  10  10  10  10    METs  2.22  2.22  2.22  3.3       Home Exercise Plan    Row Name 01/01/18 1536 01/04/18 1435  01/18/18 1000 02/12/18 1500   Plans to continue exercise at  no documentation  Home (comment) Walking 2-3 days for 30 minutes per day.   Home (comment) Walking 2-3 days for 30 minutes per day.   Home (comment) Walking 2-3 days for 30 minutes per day.    Frequency  no documentation  Add 3 additional days to program exercise sessions.  Add 3 additional days to program exercise sessions.  Add 3 additional days to program exercise sessions.   Initial Home Exercises Provided  no documentation  01/04/18  01/04/18  01/04/18          Exercise Comments: Exercise Comments    Row Name 01/02/18 1445 01/04/18 1439 01/23/18 1031 02/12/18 1554   Exercise Comments  Reviewed METs and goals with Pt. Pt will continue exercise at home in additoin to cardiac rehab.   Reviewed HEP with Pt. Responded well.   Reviewed METs and goals with Pt. Pt was responsive and understood goals.   Reviewed METs and goals with Pt. Pt was responsive and understood goals.       Exercise Goals and Review: Exercise Goals    Exercise Goals    Row Name 12/13/17 1038   Increase Physical Activity  Yes   Intervention  Develop an individualized exercise prescription for aerobic and resistive training based on initial evaluation findings, risk stratification, comorbidities and participant's personal goals.;Provide advice, education, support and counseling about physical activity/exercise needs.   Expected Outcomes  Short Term: Attend rehab on a regular basis to increase amount of physical activity.  Increase Strength and Stamina  Yes   Intervention  Provide advice, education, support and counseling about physical activity/exercise needs.;Develop an individualized exercise prescription for aerobic and resistive training based on initial evaluation findings, risk stratification, comorbidities and participant's personal goals.   Expected Outcomes  Short Term: Increase workloads from initial exercise prescription for resistance, speed, and  METs.   Able to understand and use rate of perceived exertion (RPE) scale  Yes   Intervention  Provide education and explanation on how to use RPE scale   Expected Outcomes  Short Term: Able to use RPE daily in rehab to express subjective intensity level;Long Term:  Able to use RPE to guide intensity level when exercising independently   Knowledge and understanding of Target Heart Rate Range (THRR)  Yes   Intervention  Provide education and explanation of THRR including how the numbers were predicted and where they are located for reference   Expected Outcomes  Short Term: Able to state/look up THRR;Long Term: Able to use THRR to govern intensity when exercising independently;Short Term: Able to use daily as guideline for intensity in rehab   Able to check pulse independently  Yes   Intervention  Provide education and demonstration on how to check pulse in carotid and radial arteries.;Review the importance of being able to check your own pulse for safety during independent exercise   Expected Outcomes  Short Term: Able to explain why pulse checking is important during independent exercise;Long Term: Able to check pulse independently and accurately   Understanding of Exercise Prescription  Yes   Intervention  Provide education, explanation, and written materials on patient's individual exercise prescription   Expected Outcomes  Short Term: Able to explain program exercise prescription;Long Term: Able to explain home exercise prescription to exercise independently          Exercise Goals Re-Evaluation : Exercise Goals Re-Evaluation    Exercise Goal Re-Evaluation    Row Name 01/02/18 1443 01/04/18 1437 01/23/18 1030 02/12/18 1553   Exercise Goals Review  Increase Physical Activity;Able to understand and use rate of perceived exertion (RPE) scale;Knowledge and understanding of Target Heart Rate Range (THRR);Understanding of Exercise Prescription;Increase Strength and Stamina;Able to check pulse  independently  Increase Physical Activity;Increase Strength and Stamina;Able to check pulse independently;Understanding of Exercise Prescription;Knowledge and understanding of Target Heart Rate Range (THRR);Able to understand and use rate of perceived exertion (RPE) scale  Increase Physical Activity;Increase Strength and Stamina;Able to check pulse independently;Understanding of Exercise Prescription;Knowledge and understanding of Target Heart Rate Range (THRR);Able to understand and use rate of perceived exertion (RPE) scale  Increase Physical Activity;Increase Strength and Stamina;Able to check pulse independently;Understanding of Exercise Prescription;Knowledge and understanding of Target Heart Rate Range (THRR);Able to understand and use rate of perceived exertion (RPE) scale   Comments  Reviewed METs and goals with Pt. Pt MET level is 2.2. Pt is biking 15 minutes 3 days per week in addition to cardiac rehab.   Reviewed HEP with Pt. Pt responded well to goals, exercise precautions, tmeperature regulations, and THRR. Pt will exercise 2-3 days per week for 30-45 minutes per day in addition to cardiac rehab.   Reviewed METs and goals with Pt. MET level is 2.3. Pt stated he is not exercising much at home. Recommended Pt to increase exercise at home to 2-3 days for 30-45 minutes in addition to Cardiac Rehab.   Reviewed METs and goals with Pt. MET level is 2.7 and continues to increase each session. Pt is tolerating exercise Rx and  progressing well.    Expected Outcomes  Will continue to monitor and progress Pt as tolerated.   Will monitor and progress Pt as tolerated.   Will monitor and progress Pt as tolerated.   Will monitor and progress Pt as tolerated.           Discharge Exercise Prescription (Final Exercise Prescription Changes): Exercise Prescription Changes - 02/12/18 1500    Response to Exercise          Blood Pressure (Admit)  136/60    Blood Pressure (Exercise)  140/60    Blood Pressure  (Exit)  148/72    Heart Rate (Admit)  72 bpm    Heart Rate (Exercise)  80 bpm    Heart Rate (Exit)  72 bpm    Rating of Perceived Exertion (Exercise)  10    Duration  Continue with 30 min of aerobic exercise without signs/symptoms of physical distress.    Intensity  THRR New        Progression          Progression  Continue to progress workloads to maintain intensity without signs/symptoms of physical distress.    Average METs  2.7        Resistance Training          Training Prescription  Yes    Weight  2 lbs.     Reps  10-15    Time  10 Minutes        NuStep          Level  5    SPM  75    Minutes  10    METs  2.4        Arm Ergometer          Level  2    Watts  20    Minutes  10    METs  2.75        Track          Laps  13    Minutes  10    METs  3.3        Home Exercise Plan          Plans to continue exercise at  Home (comment)   Walking 2-3 days for 30 minutes per day.    Frequency  Add 3 additional days to program exercise sessions.    Initial Home Exercises Provided  01/04/18           Nutrition:  Target Goals: Understanding of nutrition guidelines, daily intake of sodium <1530m, cholesterol <202m calories 30% from fat and 7% or less from saturated fats, daily to have 5 or more servings of fruits and vegetables.  Biometrics: Pre Biometrics - 12/13/17 1038    Pre Biometrics          Height  5' 8"  (1.727 m)    Weight  88.9 kg    Waist Circumference  41 inches    Hip Circumference  40 inches    Waist to Hip Ratio  1.02 %    BMI (Calculated)  29.81    Triceps Skinfold  19 mm    % Body Fat  30.5 %    Grip Strength  30 kg    Flexibility  7 in    Single Leg Stand  0 seconds            Nutrition Therapy Plan and Nutrition Goals: Nutrition Therapy & Goals - 12/13/17 0947    Nutrition Therapy  Diet  heart healthy        Personal Nutrition Goals          Nutrition Goal  Pt to identify and limit food sources of saturated  fat, trans fat, refined carbohydrates and sodium    Personal Goal #2  Pt to eat a variety of non-starchy vegetables    Personal Goal #3  Pt to make better choices when eating out    Personal Goal #4  Pt to decrease intake of fried foods        Intervention Plan          Intervention  Prescribe, educate and counsel regarding individualized specific dietary modifications aiming towards targeted core components such as weight, hypertension, lipid management, diabetes, heart failure and other comorbidities.    Expected Outcomes  Short Term Goal: Understand basic principles of dietary content, such as calories, fat, sodium, cholesterol and nutrients.;Long Term Goal: Adherence to prescribed nutrition plan.           Nutrition Assessments: Nutrition Assessments - 12/13/17 0949    MEDFICTS Scores          Pre Score  69           Nutrition Goals Re-Evaluation:   Nutrition Goals Re-Evaluation:   Nutrition Goals Discharge (Final Nutrition Goals Re-Evaluation):   Psychosocial: Target Goals: Acknowledge presence or absence of significant depression and/or stress, maximize coping skills, provide positive support system. Participant is able to verbalize types and ability to use techniques and skills needed for reducing stress and depression.  Initial Review & Psychosocial Screening: Initial Psych Review & Screening - 12/13/17 1122    Initial Review          Current issues with  None Identified        Family Dynamics          Good Support System?  Yes   family        Barriers          Psychosocial barriers to participate in program  There are no identifiable barriers or psychosocial needs.        Screening Interventions          Interventions  Encouraged to exercise           Quality of Life Scores: Quality of Life - 12/13/17 1048    Quality of Life          Select  Quality of Life        Quality of Life Scores          Health/Function Pre  27.6 %     Socioeconomic Pre  30 %    Psych/Spiritual Pre  29.14 %    Family Pre  28.8 %    GLOBAL Pre  28.63 %          Scores of 19 and below usually indicate a poorer quality of life in these areas.  A difference of  2-3 points is a clinically meaningful difference.  A difference of 2-3 points in the total score of the Quality of Life Index has been associated with significant improvement in overall quality of life, self-image, physical symptoms, and general health in studies assessing change in quality of life.  PHQ-9: Recent Review Flowsheet Data    Depression screen [facility] 2/9 12/17/2017   Decreased Interest 0   Down, Depressed, Hopeless 0   PHQ - 2 Score 0     Interpretation of Total Score  Total Score Depression Severity:  1-4 = Minimal depression, 5-9 = Mild depression, 10-14 = Moderate depression, 15-19 = Moderately severe depression, 20-27 = Severe depression   Psychosocial Evaluation and Intervention: Psychosocial Evaluation - 12/17/17 1420    Psychosocial Evaluation & Interventions          Interventions  Encouraged to exercise with the program and follow exercise prescription    Comments  no psychosocial needs identified, no interventions necessary. pt unable to participate in former hobbies due to vision loss. pt currently enjoys container gardening.  pt has very supportive family.    Expected Outcomes  pt will exhibit positive outlook with good coping skills.     Continue Psychosocial Services   No Follow up required           Psychosocial Re-Evaluation: Psychosocial Re-Evaluation    Psychosocial Re-Evaluation    [place] Name 12/26/17 1436 01/03/18 1209 01/18/18 1526 02/18/18 0728   Current issues with  None Identified  None Identified  None Identified  None Identified   Comments  no psychosocial needs identified, no interventions necessary   no psychosocial needs identified, no interventions necessary   no psychosocial needs identified, no interventions necessary   no  psychosocial needs identified, no interventions necessary    Expected Outcomes  pt will exhibit positive outlook with good coping skills.   pt will exhibit positive outlook with good coping skills.   pt will exhibit positive outlook with good coping skills.   pt will exhibit positive outlook with good coping skills.    Interventions  Encouraged to attend Cardiac Rehabilitation for the exercise  Encouraged to attend Cardiac Rehabilitation for the exercise  Encouraged to attend Cardiac Rehabilitation for the exercise  Encouraged to attend Cardiac Rehabilitation for the exercise   Continue Psychosocial Services   No Follow up required  No Follow up required  No Follow up required  No Follow up required          Psychosocial Discharge (Final Psychosocial Re-Evaluation): Psychosocial Re-Evaluation - 02/18/18 0728    Psychosocial Re-Evaluation          Current issues with  None Identified    Comments  no psychosocial needs identified, no interventions necessary     Expected Outcomes  pt will exhibit positive outlook with good coping skills.     Interventions  Encouraged to attend Cardiac Rehabilitation for the exercise    Continue Psychosocial Services   No Follow up required           Vocational Rehabilitation: Provide vocational rehab assistance to qualifying candidates.   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 Participant will state understanding/return demonstration of topics presented.  Learning Barriers/Preferences: Learning Barriers/Preferences - 12/12/17 1521    Learning Barriers/Preferences          Learning Barriers  Sight   macular degeneration   Learning Preferences   Written Material;Verbal Instruction           Education Topics: Count Your Pulse:  -Group instruction provided by verbal instruction, demonstration, patient participation and written materials to support subject.  Instructors address importance of being able to find your pulse and how to count your pulse when at  home without a heart monitor.  Patients get hands on experience counting their pulse with staff help and individually.   Heart Attack, Angina, and Risk Factor Modification:  -Group instruction provided by verbal instruction, video, and written materials to support subject.  Instructors address signs and symptoms of angina and heart attacks.    Also discuss risk factors for heart disease and how to make changes to improve heart health risk factors.   Functional Fitness:  -Group instruction provided by verbal instruction, demonstration, patient participation, and written materials to support subject.  Instructors address safety measures for doing things around the house.  Discuss how to get up and down off the floor, how to pick things up properly, how to safely get out of a chair without assistance, and balance training.   Meditation and Mindfulness:  -Group instruction provided by verbal instruction, patient participation, and written materials to support subject.  Instructor addresses importance of mindfulness and meditation practice to help reduce stress and improve awareness.  Instructor also leads participants through a meditation exercise.    Stretching for Flexibility and Mobility:  -Group instruction provided by verbal instruction, patient participation, and written materials to support subject.  Instructors lead participants through series of stretches that are designed to increase flexibility thus improving mobility.  These stretches are additional exercise for major muscle groups that are typically performed during regular warm up and cool down.   Hands Only CPR:  -Group  verbal, video, and participation provides a basic overview of AHA guidelines for community CPR. Role-play of emergencies allow participants the opportunity to practice calling for help and chest compression technique with discussion of AED use.   Hypertension: -Group verbal and written instruction that provides a basic overview of hypertension including the most recent diagnostic guidelines, risk factor reduction with self-care instructions and medication management.    Nutrition I class: Heart Healthy Eating:  -Group instruction provided by PowerPoint slides, verbal discussion, and written materials to support subject matter. The instructor gives an explanation and review of the Therapeutic Lifestyle Changes diet recommendations, which includes a discussion on lipid goals, dietary fat, sodium, fiber, plant stanol/sterol esters, sugar, and the components of a well-balanced, healthy diet.   Nutrition II class: Lifestyle Skills:  -Group instruction provided by PowerPoint slides, verbal discussion, and written materials to support subject matter. The instructor gives an explanation and review of label reading, grocery shopping for heart health, heart healthy recipe modifications, and ways to make healthier choices when eating out.   Diabetes Question & Answer:  -Group instruction provided by PowerPoint slides, verbal discussion, and written materials to support subject matter. The instructor gives an explanation and review of diabetes co-morbidities, pre- and post-prandial blood glucose goals, pre-exercise blood glucose goals, signs, symptoms, and treatment of hypoglycemia and hyperglycemia, and foot care basics.   Diabetes Blitz:  -Group instruction provided by PowerPoint slides, verbal discussion, and written materials to support subject matter. The instructor gives an explanation and review of the physiology behind type 1 and type 2 diabetes, diabetes medications and rational behind using  different medications, pre- and post-prandial blood glucose recommendations and Hemoglobin A1c goals, diabetes diet, and exercise including blood glucose guidelines for exercising safely.    Portion Distortion:  -Group instruction provided by PowerPoint slides, verbal discussion, written materials, and food models to support subject matter. The instructor gives an explanation of serving size versus portion size, changes in portions sizes over the last 20 years, and what consists of a serving from each food  group.   Stress Management:  -Group instruction provided by verbal instruction, video, and written materials to support subject matter.  Instructors review role of stress in heart disease and how to cope with stress positively.     Exercising on Your Own:  -Group instruction provided by verbal instruction, power point, and written materials to support subject.  Instructors discuss benefits of exercise, components of exercise, frequency and intensity of exercise, and end points for exercise.  Also discuss use of nitroglycerin and activating EMS.  Review options of places to exercise outside of rehab.  Review guidelines for sex with heart disease.   Cardiac Drugs I:  -Group instruction provided by verbal instruction and written materials to support subject.  Instructor reviews cardiac drug classes: antiplatelets, anticoagulants, beta blockers, and statins.  Instructor discusses reasons, side effects, and lifestyle considerations for each drug class.   Cardiac Drugs II:  -Group instruction provided by verbal instruction and written materials to support subject.  Instructor reviews cardiac drug classes: angiotensin converting enzyme inhibitors (ACE-I), angiotensin II receptor blockers (ARBs), nitrates, and calcium channel blockers.  Instructor discusses reasons, side effects, and lifestyle considerations for each drug class. Flowsheet Row CARDIAC REHAB PHASE II EXERCISE from 01/16/2018 in [place]  Date  01/16/18  Educator  Pharmacist  Instruction Review Code  2- Demonstrated Understanding      Anatomy and Physiology of the Circulatory System:  Group verbal and written instruction and models provide basic cardiac anatomy and physiology, with the coronary electrical and arterial systems. Review of: AMI, Angina, Valve disease, Heart Failure, Peripheral Artery Disease, Cardiac Arrhythmia, Pacemakers, and the ICD.   Other Education:  -Group or individual verbal, written, or video instructions that support the educational goals of the cardiac rehab program.   Holiday Eating Survival Tips:  -Group instruction provided by PowerPoint slides, verbal discussion, and written materials to support subject matter. The instructor gives patients tips, tricks, and techniques to help them not only survive but enjoy the holidays despite the onslaught of food that accompanies the holidays.   Knowledge Questionnaire Score: Knowledge Questionnaire Score - 12/13/17 1039    Knowledge Questionnaire Score          Pre Score  14/24           Core Components/Risk Factors/Patient Goals at Admission: Personal Goals and Risk Factors at Admission - 12/13/17 1053    Core Components/Risk Factors/Patient Goals on Admission           Weight Management  Yes;Weight Maintenance;Weight Loss    Admit Weight  195 lb 15.8 oz (88.9 kg)    Expected Outcomes  Short Term: Continue to assess and modify interventions until short term weight is achieved;Long Term: Adherence to nutrition and physical activity/exercise program aimed toward attainment of established weight goal;Weight Maintenance: Understanding of the daily nutrition guidelines, which includes 25-35% calories from fat, 7% or less cal from saturated fats, less than '200mg'$  cholesterol, less than 1.5gm of sodium, & 5 or more servings of fruits and vegetables daily;Weight Loss: Understanding of general recommendations for a  balanced deficit meal plan, which promotes 1-2 lb weight loss per week and includes a negative energy balance of [id number] kcal/d;Understanding recommendations for meals to include 15-35% energy as protein, 25-35% energy from fat, 35-60% energy from carbohydrates, less than '200mg'$  of dietary cholesterol, 20-35 gm of total fiber daily;Understanding of distribution of calorie intake throughout the day with the consumption of 4-5 meals/snacks    Hypertension  Yes  Intervention  Provide education on lifestyle modifcations including regular physical activity/exercise, weight management, moderate sodium restriction and increased consumption of fresh fruit, vegetables, and low fat dairy, alcohol moderation, and smoking cessation.;Monitor prescription use compliance.    Expected Outcomes  Short Term: Continued assessment and intervention until BP is < 140/55m HG in hypertensive participants. < 130/827mHG in hypertensive participants with diabetes, heart failure or chronic kidney disease.;Long Term: Maintenance of blood pressure at goal levels.    Intervention  Provide education and support for participant on nutrition & aerobic/resistive exercise along with prescribed medications to achieve LDL <7021mHDL >42m14m  Expected Outcomes  Short Term: Participant states understanding of desired cholesterol values and is compliant with medications prescribed. Participant is following exercise prescription and nutrition guidelines.;Long Term: Cholesterol controlled with medications as prescribed, with individualized exercise RX and with personalized nutrition plan. Value goals: LDL < 70mg46mL > 40 mg.           Core Components/Risk Factors/Patient Goals Review:  Goals and Risk Factor Review    Core Components/Risk Factors/Patient Goals Review    Row Name 12/17/17 1421 01/03/18 1210 01/18/18 1527 02/18/18 0728   Personal Goals Review  Weight Management/Obesity;Hypertension;Lipids  Weight  Management/Obesity;Hypertension;Lipids  Weight Management/Obesity;Hypertension;Lipids  Weight Management/Obesity;Hypertension;Lipids   Review  pt with CAD RF demonstrates eagerness to participate in CR program. pt personal goals are to lose weight and improve overall health/well being. pt would like to restore his recent funcational decline.    pt with CAD RF demonstrates eagerness to participate in CR program. pt personal goals are to lose weight and improve overall health/well being. pt would like to improve upon his recent funcational decline and restore activity level to baseline prior to cardiac event.   pt with CAD RF demonstrates eagerness to participate in CR program. pt personal goals are to lose weight and improve overall health/well being. pt using stationary bike at home. pt states he enjoys walking and talking at CR.    pt with CAD RF demonstrates eagerness to participate in CR program. pt personal goals are to lose weight and improve overall health/well being. pt using stationary bike and walking 1/2 mile at home.  pt states he enjoys walking and talking at CR.     Expected Outcomes  pt will participate in CR exercise, nutrition and lifestyle modification to decrease overall RF to achieve personal and program goals.   pt will participate in CR exercise, nutrition and lifestyle modification to decrease overall RF to achieve personal and program goals.   pt will participate in CR exercise, nutrition and lifestyle modification to decrease overall RF to achieve personal and program goals.   pt will participate in CR exercise, nutrition and lifestyle modification to decrease overall RF to achieve personal and program goals.           Core Components/Risk Factors/Patient Goals at Discharge (Final Review):  Goals and Risk Factor Review - 02/18/18 0728    Core Components/Risk Factors/Patient Goals Review          Personal Goals Review  Weight Management/Obesity;Hypertension;Lipids    Review  pt  with CAD RF demonstrates eagerness to participate in CR program. pt personal goals are to lose weight and improve overall health/well being. pt using stationary bike and walking 1/2 mile at home.  pt states he enjoys walking and talking at CR.      Expected Outcomes  pt will participate in CR exercise, nutrition and lifestyle modification to decrease overall RF  to achieve personal and program goals.            ITP Comments: ITP Comments    Row Name 12/12/17 1519 12/13/17 5885 12/17/17 1419 01/03/18 1209 01/18/18 1526   ITP Comments  Dr.Marchel Foote Radford Pax, Medical Director   Dr.Honestii Marton, Medical Director   pt started group exercise. pt tolerated light activity without difficulty. pt oriented to exercise equipment and safety routine.  pt verbalized understanding.   30 day ITP review. pt with good attendance and participation.  pt demonstrates eagerness to partcipate in CR program.   30 day ITP review. pt with good attendance and participation.  pt demonstrates eagerness to partcipate in [place] program.    [place] Name 02/20/18 1644   ITP Comments  30 day ITP review. pt with good attendance and participation.  pt recent absences from holiday family plans. pt demonstrates eagerness to partcipate in CR program.       Comments:

## 2018-02-22 ENCOUNTER — Ambulatory Visit (HOSPITAL_COMMUNITY): Payer: Medicare Other

## 2018-02-22 ENCOUNTER — Encounter (HOSPITAL_COMMUNITY)
Admission: RE | Admit: 2018-02-22 | Discharge: 2018-02-22 | Disposition: A | Discharge status: Home / Self Care | Payer: Medicare Other | Source: Ambulatory Visit | Attending: Cardiovascular Disease | Admitting: Cardiovascular Disease

## 2018-02-22 DIAGNOSIS — Z9861 Coronary angioplasty status: Secondary | ICD-10-CM | POA: Diagnosis not present

## 2018-02-25 ENCOUNTER — Encounter (HOSPITAL_COMMUNITY): Payer: Medicare Other

## 2018-02-25 ENCOUNTER — Ambulatory Visit (HOSPITAL_COMMUNITY): Payer: Medicare Other

## 2018-02-25 DIAGNOSIS — E7849 Other hyperlipidemia: Secondary | ICD-10-CM | POA: Diagnosis not present

## 2018-02-25 DIAGNOSIS — I509 Heart failure, unspecified: Secondary | ICD-10-CM | POA: Diagnosis not present

## 2018-02-25 DIAGNOSIS — K4090 Unilateral inguinal hernia, without obstruction or gangrene, not specified as recurrent: Secondary | ICD-10-CM | POA: Diagnosis not present

## 2018-02-25 DIAGNOSIS — M1990 Unspecified osteoarthritis, unspecified site: Secondary | ICD-10-CM | POA: Diagnosis not present

## 2018-02-25 DIAGNOSIS — I2510 Atherosclerotic heart disease of native coronary artery without angina pectoris: Secondary | ICD-10-CM | POA: Diagnosis not present

## 2018-02-25 DIAGNOSIS — I10 Essential (primary) hypertension: Secondary | ICD-10-CM | POA: Diagnosis not present

## 2018-02-25 DIAGNOSIS — I739 Peripheral vascular disease, unspecified: Secondary | ICD-10-CM | POA: Diagnosis not present

## 2018-02-25 DIAGNOSIS — Z952 Presence of prosthetic heart valve: Secondary | ICD-10-CM | POA: Diagnosis not present

## 2018-02-25 DIAGNOSIS — K5900 Constipation, unspecified: Secondary | ICD-10-CM | POA: Diagnosis not present

## 2018-02-25 DIAGNOSIS — I482 Chronic atrial fibrillation, unspecified: Secondary | ICD-10-CM | POA: Diagnosis not present

## 2018-02-25 DIAGNOSIS — K7460 Unspecified cirrhosis of liver: Secondary | ICD-10-CM | POA: Diagnosis not present

## 2018-02-25 DIAGNOSIS — R609 Edema, unspecified: Secondary | ICD-10-CM | POA: Diagnosis not present

## 2018-02-27 ENCOUNTER — Ambulatory Visit (HOSPITAL_COMMUNITY): Payer: Medicare Other

## 2018-02-27 ENCOUNTER — Encounter (HOSPITAL_COMMUNITY): Payer: Medicare Other

## 2018-02-28 DIAGNOSIS — H353231 Exudative age-related macular degeneration, bilateral, with active choroidal neovascularization: Secondary | ICD-10-CM | POA: Diagnosis not present

## 2018-03-01 ENCOUNTER — Ambulatory Visit (HOSPITAL_COMMUNITY): Payer: Medicare Other

## 2018-03-01 ENCOUNTER — Encounter (HOSPITAL_COMMUNITY)
Admission: RE | Admit: 2018-03-01 | Discharge: 2018-03-01 | Disposition: A | Discharge status: Home / Self Care | Payer: Medicare Other | Source: Ambulatory Visit | Attending: Cardiovascular Disease | Admitting: Cardiovascular Disease

## 2018-03-01 DIAGNOSIS — Z9861 Coronary angioplasty status: Secondary | ICD-10-CM | POA: Diagnosis not present

## 2018-03-04 ENCOUNTER — Ambulatory Visit (HOSPITAL_COMMUNITY): Payer: Medicare Other

## 2018-03-04 ENCOUNTER — Encounter (HOSPITAL_COMMUNITY)
Admission: RE | Admit: 2018-03-04 | Discharge: 2018-03-04 | Disposition: A | Discharge status: Home / Self Care | Payer: Medicare Other | Source: Ambulatory Visit | Attending: Cardiovascular Disease | Admitting: Cardiovascular Disease

## 2018-03-04 DIAGNOSIS — Z9861 Coronary angioplasty status: Secondary | ICD-10-CM | POA: Diagnosis not present

## 2018-03-05 ENCOUNTER — Other Ambulatory Visit: Payer: Self-pay

## 2018-03-05 MED ORDER — ISOSORBIDE MONONITRATE ER 60 MG PO TB24: 90.0000 mg | ORAL_TABLET | Freq: Every day | ORAL | 2 refills | Status: DC

## 2018-03-06 ENCOUNTER — Encounter (HOSPITAL_COMMUNITY)
Admission: RE | Admit: 2018-03-06 | Discharge: 2018-03-06 | Disposition: A | Discharge status: Home / Self Care | Payer: Medicare Other | Source: Ambulatory Visit | Attending: Cardiovascular Disease | Admitting: Cardiovascular Disease

## 2018-03-06 ENCOUNTER — Ambulatory Visit (HOSPITAL_COMMUNITY): Payer: Medicare Other

## 2018-03-06 DIAGNOSIS — Z9861 Coronary angioplasty status: Secondary | ICD-10-CM | POA: Diagnosis not present

## 2018-03-08 ENCOUNTER — Encounter (HOSPITAL_COMMUNITY)
Admission: RE | Admit: 2018-03-08 | Discharge: 2018-03-08 | Disposition: A | Discharge status: Home / Self Care | Payer: Medicare Other | Source: Ambulatory Visit | Attending: Cardiovascular Disease | Admitting: Cardiovascular Disease

## 2018-03-08 ENCOUNTER — Ambulatory Visit (HOSPITAL_COMMUNITY): Payer: Medicare Other

## 2018-03-08 DIAGNOSIS — Z9861 Coronary angioplasty status: Secondary | ICD-10-CM | POA: Diagnosis not present

## 2018-03-10 ENCOUNTER — Encounter: Payer: Self-pay | Admitting: Podiatry

## 2018-03-10 NOTE — Progress Notes (Signed)
Subjective: Tyler Williams is a 85 y.o. y.o. male who presents today with painful, discolored, thick toenails  which interfere with daily activities. Pain is aggravated when wearing enclosed shoe gear. Pain is relieved with periodic professional debridement.  He continues to be on Coumadin.   Patient's PCP is Burnard Bunting, MD.   Current Outpatient Medications:  .  amLODipine (NORVASC) 10 MG tablet, Take 10 mg by mouth daily., Disp: , Rfl:  .  aspirin EC 81 MG tablet, Take 81 mg by mouth daily., Disp: , Rfl:  .  atorvastatin (LIPITOR) 20 MG tablet, TAKE 1 TABLET ONCE DAILY., Disp: 90 tablet, Rfl: 3 .  Calcium-Magnesium-Zinc (CAL-MAG-ZINC PO), Take 1 tablet by mouth daily., Disp: , Rfl:  .  Cholecalciferol (VITAMIN D3) 5000 units TABS, Take 1 tablet by mouth daily., Disp: , Rfl:  .  Coenzyme Q10 (COQ-10) 10 MG CAPS, Take 10 mg by mouth daily., Disp: , Rfl:  .  fesoterodine (TOVIAZ) 8 MG TB24 tablet, Take 8 mg by mouth every evening., Disp: , Rfl:  .  furosemide (LASIX) 40 MG tablet, Take 40 mg by mouth daily., Disp: , Rfl:  .  LORazepam (ATIVAN) 1 MG tablet, Take 1 mg by mouth at bedtime., Disp: , Rfl:  .  metoprolol succinate (TOPROL-XL) 25 MG 24 hr tablet, Take 1 tablet (25 mg total) by mouth daily., Disp: 90 tablet, Rfl: 3 .  multivitamin-lutein (OCUVITE-LUTEIN) CAPS capsule, Take 1 capsule by mouth daily., Disp: , Rfl:  .  nitroGLYCERIN (NITROSTAT) 0.4 MG SL tablet, Place 0.4 mg under the tongue every 5 (five) minutes as needed for chest pain., Disp: , Rfl:  .  omega-3 acid ethyl esters (LOVAZA) 1 G capsule, Take 1 g by mouth daily., Disp: , Rfl:  .  Omega-3 Fatty Acids (FISH OIL PO), Take 1 tablet by mouth daily., Disp: , Rfl:  .  ramipril (ALTACE) 10 MG capsule, Take 10 mg by mouth 2 (two) times daily., Disp: , Rfl:  .  ranibizumab (LUCENTIS) 0.5 MG/0.05ML SOLN, 0.5 mg by Intravitreal route See admin instructions. Every 9 weeks, Disp: , Rfl:  .  spironolactone (ALDACTONE) 25  MG tablet, Take 25 mg by mouth daily., Disp: , Rfl:  .  warfarin (COUMADIN) 5 MG tablet, Take 5-7.5 mg by mouth See admin instructions. Take 1 and 1/2 tablets on Monday then take 1 tablet all the other days, Disp: , Rfl:  .  isosorbide mononitrate (IMDUR) 60 MG 24 hr tablet, Take 1.5 tablets (90 mg total) by mouth daily., Disp: 135 tablet, Rfl: 2   Allergies  Allergen Reactions  . No Known Allergies      Objective: Vascular Examination: Capillary refill time less than 3 seconds to all 10 digits Dorsalis pedis and posterior tibial pulses are present bilaterally No digital hair present x10 digits Skin temperature gradient within normal limits bilaterally  Dermatological Examination: Skin thin and atrophic bilaterally  Toenails 1-5 b/l discolored, thick, dystrophic with subungual debris and pain with palpation to nailbeds due to thickness of nails.  Musculoskeletal: Muscle strength 5/5 to all LE muscle groups  Neurological: Sensation intact with 10 gram monofilament. Vibratory sensation intact.  Assessment: Painful onychomycosis toenails 1-5 b/l in patient on blood thinner.   Plan: 1. Toenails 1-5 b/l were debrided in length and girth without iatrogenic bleeding. 2. Patient to continue soft, supportive shoe gear 3. Patient to report any pedal injuries to medical professional immediately. 4. Avoid self trimming due to use of blood thinner. 5. Follow  up 3 months. Patient/POA to call should there be a concern in the interim.

## 2018-03-11 ENCOUNTER — Encounter (HOSPITAL_COMMUNITY)
Admission: RE | Admit: 2018-03-11 | Discharge: 2018-03-11 | Disposition: A | Discharge status: Home / Self Care | Payer: Medicare Other | Source: Ambulatory Visit | Attending: Cardiovascular Disease | Admitting: Cardiovascular Disease

## 2018-03-11 ENCOUNTER — Ambulatory Visit (HOSPITAL_COMMUNITY): Payer: Medicare Other

## 2018-03-11 DIAGNOSIS — R609 Edema, unspecified: Secondary | ICD-10-CM | POA: Diagnosis not present

## 2018-03-11 DIAGNOSIS — I739 Peripheral vascular disease, unspecified: Secondary | ICD-10-CM | POA: Diagnosis not present

## 2018-03-11 DIAGNOSIS — Z9861 Coronary angioplasty status: Secondary | ICD-10-CM

## 2018-03-11 DIAGNOSIS — Z952 Presence of prosthetic heart valve: Secondary | ICD-10-CM | POA: Diagnosis not present

## 2018-03-11 DIAGNOSIS — I509 Heart failure, unspecified: Secondary | ICD-10-CM | POA: Diagnosis not present

## 2018-03-11 DIAGNOSIS — Z1331 Encounter for screening for depression: Secondary | ICD-10-CM | POA: Diagnosis not present

## 2018-03-11 DIAGNOSIS — I10 Essential (primary) hypertension: Secondary | ICD-10-CM | POA: Diagnosis not present

## 2018-03-11 DIAGNOSIS — I2510 Atherosclerotic heart disease of native coronary artery without angina pectoris: Secondary | ICD-10-CM | POA: Diagnosis not present

## 2018-03-11 DIAGNOSIS — E7849 Other hyperlipidemia: Secondary | ICD-10-CM | POA: Diagnosis not present

## 2018-03-11 DIAGNOSIS — K7460 Unspecified cirrhosis of liver: Secondary | ICD-10-CM | POA: Diagnosis not present

## 2018-03-11 DIAGNOSIS — M1990 Unspecified osteoarthritis, unspecified site: Secondary | ICD-10-CM | POA: Diagnosis not present

## 2018-03-11 DIAGNOSIS — I482 Chronic atrial fibrillation, unspecified: Secondary | ICD-10-CM | POA: Diagnosis not present

## 2018-03-13 ENCOUNTER — Encounter (HOSPITAL_COMMUNITY)
Admission: RE | Admit: 2018-03-13 | Discharge: 2018-03-13 | Disposition: A | Discharge status: Home / Self Care | Payer: Medicare Other | Source: Ambulatory Visit | Attending: Cardiovascular Disease | Admitting: Cardiovascular Disease

## 2018-03-13 ENCOUNTER — Ambulatory Visit (HOSPITAL_COMMUNITY): Payer: Medicare Other

## 2018-03-13 DIAGNOSIS — Z9861 Coronary angioplasty status: Secondary | ICD-10-CM | POA: Diagnosis not present

## 2018-03-13 NOTE — Progress Notes (Deleted)
Tyler Williams 85 y.o. male DOB Nov 09, 1926 MRN [id number]       Nutrition Screener Note  1. 08/31/2017 Status post coronary artery stent placement    Past Medical History:  Diagnosis Date  . A-fib ([place])   . AAA (abdominal aortic aneurysm) ([place])    s/p repair  . Aortic stenosis, mild    a. mean gradient 17 mmHg on 04/2012 TTE  . Arthritis    knees  . CAD (coronary artery disease)    a. s/p multiple percutaneous prior coronary artery interventions b. 05/2012: unsuccessful PCI to tight RCA->medically managed  . Cancer [facility])    skin cancer removed, right hand, left leg, chest  . Carotid artery disease ([place])    0-39% bilateral ICA stenoses 11/2011  . Chronic anticoagulation    Coumadin  . Coronary artery disease   . GERD (gastroesophageal reflux disease)   . GERD (gastroesophageal reflux disease)   . History of pulmonary embolism    1964 related to dislocated hip on right   . History of skin cancer   . Hypercholesteremia   . Hypertension   . Incidental cecal mass noted on CT imaging 05/24/2016   **An incidental finding of potential clinical significance has been found. 4.6 x 5.6 x 3.7 cm masslike lesion in the cecum adjacent to the ileocecal valve may represent a large polyp or colonic neoplasm. Correlation with nonemergent colonoscopy is strongly recommended in the near future to better evaluate this finding.**  . Macular degeneration    eye injections  . Microscopic hematuria    Followed by urology  . Permanent atrial fibrillation   . PVD (peripheral vascular disease) ([place])    Bilateral renal artery atherosclerosis, SMA stenosis with collateralization  . RBBB    Chronic  . 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 SSS (sick sinus syndrome) ([place])    Meds reviewed.     Current Outpatient Medications (Cardiovascular):  .  amLODipine (NORVASC) 10  MG tablet, Take 10 mg by mouth daily. Marland Kitchen  atorvastatin (LIPITOR) 20 MG tablet, TAKE 1 TABLET ONCE DAILY. .  furosemide (LASIX) 40 MG tablet, Take 40 mg by mouth daily. .  isosorbide mononitrate (IMDUR) 60 MG 24 hr tablet, Take 1.5 tablets (90 mg total) by mouth daily. .  metoprolol succinate (TOPROL-XL) 25 MG 24 hr tablet, Take 1 tablet (25 mg total) by mouth daily. .  nitroGLYCERIN (NITROSTAT) 0.4 MG SL tablet, Place 0.4 mg under the tongue every 5 (five) minutes as needed for chest pain. Marland Kitchen  omega-3 acid ethyl esters (LOVAZA) 1 G capsule, Take 1 g by mouth daily. .  ramipril (ALTACE) 10 MG capsule, Take 10 mg by mouth 2 (two) times daily. Marland Kitchen  spironolactone (ALDACTONE) 25 MG tablet, Take 25 mg by mouth daily.   Current Outpatient Medications (Analgesics):  .  aspirin EC 81 MG tablet, Take 81 mg by mouth daily.  Current Outpatient Medications (Hematological):  .  warfarin (COUMADIN) 5 MG tablet, Take 5-7.5 mg by mouth See admin instructions. Take 1 and 1/2 tablets on Monday then take 1 tablet all the other days  Current Outpatient Medications (Other):  Marland Kitchen  Calcium-Magnesium-Zinc (CAL-MAG-ZINC PO), Take 1 tablet by mouth daily. .  Cholecalciferol (VITAMIN D3) 5000 units TABS, Take 1 tablet by mouth daily. .  Coenzyme Q10 (COQ-10) 10 MG CAPS, Take 10 mg by mouth daily. .  fesoterodine (TOVIAZ) 8  MG TB24 tablet, Take 8 mg by mouth every evening. Marland Kitchen  LORazepam (ATIVAN) 1 MG tablet, Take 1 mg by mouth at bedtime. .  multivitamin-lutein (OCUVITE-LUTEIN) CAPS capsule, Take 1 capsule by mouth daily. .  Omega-3 Fatty Acids (FISH OIL PO), Take 1 tablet by mouth daily. .  ranibizumab (LUCENTIS) 0.5 MG/0.05ML SOLN, 0.5 mg by Intravitreal route See admin instructions. Every 9 weeks   HT: Ht Readings from Last 1 Encounters:  01/02/18 5\' 8"  (1.727 m)    WT: Wt Readings from Last 5 Encounters:  01/02/18 195 lb 12.8 oz (88.8 kg)  12/13/17 195 lb 15.8 oz (88.9 kg)  09/25/17 186 lb (84.4 kg)  09/01/17  177 lb 4 oz (80.4 kg)  06/20/17 190 lb (86.2 kg)     BMI = 29.78   01/02/18  Current tobacco use? No       Labs:  Lipid Panel     Component Value Date/Time   CHOL 149 08/28/2017 0350   TRIG 95 08/28/2017 0350   HDL 52 08/28/2017 0350   CHOLHDL 2.9 08/28/2017 0350   VLDL 19 08/28/2017 0350   LDLCALC 78 08/28/2017 0350    Lab Results  Component Value Date   HGBA1C 5.0 06/09/2016   CBG (last 3)  No results for input(s): GLUCAP in the last 72 hours.  Nutrition Diagnosis ? Food-and nutrition-related knowledge deficit related to lack of exposure to information as related to diagnosis of: ? CVD  ? CHF ? Overweight  related to excessive energy intake as evidenced by a 29.78    01/02/18  Nutrition Goal(s):  ? To be determined  Plan:  Pt to attend nutrition classes ? Nutrition I ? Nutrition II ? Portion Distortion  Will provide client-centered nutrition education as part of interdisciplinary care.   Monitor and evaluate progress toward nutrition goal with team.  Laurina Bustle, MS, RD, LDN 03/13/2018 2:49 PM

## 2018-03-15 ENCOUNTER — Ambulatory Visit (HOSPITAL_COMMUNITY): Payer: Medicare Other

## 2018-03-15 ENCOUNTER — Encounter (HOSPITAL_COMMUNITY)
Admission: RE | Admit: 2018-03-15 | Discharge: 2018-03-15 | Disposition: A | Discharge status: Home / Self Care | Payer: Medicare Other | Source: Ambulatory Visit | Attending: Cardiovascular Disease | Admitting: Cardiovascular Disease

## 2018-03-15 DIAGNOSIS — Z9861 Coronary angioplasty status: Secondary | ICD-10-CM

## 2018-03-18 ENCOUNTER — Encounter (HOSPITAL_COMMUNITY)
Admission: RE | Admit: 2018-03-18 | Discharge: 2018-03-18 | Disposition: A | Discharge status: Home / Self Care | Payer: Medicare Other | Source: Ambulatory Visit | Attending: Cardiovascular Disease | Admitting: Cardiovascular Disease

## 2018-03-18 ENCOUNTER — Ambulatory Visit (HOSPITAL_COMMUNITY): Payer: Medicare Other

## 2018-03-18 DIAGNOSIS — Z9861 Coronary angioplasty status: Secondary | ICD-10-CM | POA: Diagnosis not present

## 2018-03-20 ENCOUNTER — Encounter (HOSPITAL_COMMUNITY)
Admission: RE | Admit: 2018-03-20 | Discharge: 2018-03-20 | Disposition: A | Discharge status: Home / Self Care | Payer: Medicare Other | Source: Ambulatory Visit | Attending: Cardiovascular Disease | Admitting: Cardiovascular Disease

## 2018-03-20 ENCOUNTER — Ambulatory Visit (HOSPITAL_COMMUNITY): Payer: Medicare Other

## 2018-03-20 DIAGNOSIS — Z9861 Coronary angioplasty status: Secondary | ICD-10-CM

## 2018-03-21 NOTE — Progress Notes (Addendum)
Cardiac Individual Treatment Plan  Patient Details  Name: Tyler Williams MRN: [id number] Date of Birth: 03/22/1926 Referring Provider:     [facility] from 12/13/2017 in [place]  Referring Provider  Dr. Burt Knack       Initial Encounter Date:    CARDIAC REHAB PHASE II ORIENTATION from 12/13/2017 in [place]  Date  12/12/17      Visit Diagnosis: S/P PTCA (percutaneous transluminal coronary angioplasty)  Patient's Home Medications on Admission:  Current Outpatient Medications:  .  amLODipine (NORVASC) 10 MG tablet, Take 10 mg by mouth daily., Disp: , Rfl:  .  aspirin EC 81 MG tablet, Take 81 mg by mouth daily., Disp: , Rfl:  .  atorvastatin (LIPITOR) 20 MG tablet, TAKE 1 TABLET ONCE DAILY., Disp: 90 tablet, Rfl: 3 .  Calcium-Magnesium-Zinc (CAL-MAG-ZINC PO), Take 1 tablet by mouth daily., Disp: , Rfl:  .  Cholecalciferol (VITAMIN D3) 5000 units TABS, Take 1 tablet by mouth daily., Disp: , Rfl:  .  Coenzyme Q10 (COQ-10) 10 MG CAPS, Take 10 mg by mouth daily., Disp: , Rfl:  .  fesoterodine (TOVIAZ) 8 MG TB24 tablet, Take 8 mg by mouth every evening., Disp: , Rfl:  .  furosemide (LASIX) 40 MG tablet, Take 40 mg by mouth daily., Disp: , Rfl:  .  isosorbide mononitrate (IMDUR) 60 MG 24 hr tablet, Take 1.5 tablets (90 mg total) by mouth daily., Disp: 135 tablet, Rfl: 2 .  LORazepam (ATIVAN) 1 MG tablet, Take 1 mg by mouth at bedtime., Disp: , Rfl:  .  metoprolol succinate (TOPROL-XL) 25 MG 24 hr tablet, Take 1 tablet (25 mg total) by mouth daily., Disp: 90 tablet, Rfl: 3 .  multivitamin-lutein (OCUVITE-LUTEIN) CAPS capsule, Take 1 capsule by mouth daily., Disp: , Rfl:  .  nitroGLYCERIN (NITROSTAT) 0.4 MG SL tablet, Place 0.4 mg under the tongue every 5 (five) minutes as needed for chest pain., Disp: , Rfl:  .  omega-3 acid ethyl esters (LOVAZA) 1 G capsule, Take 1 g by mouth daily., Disp: , Rfl:   .  Omega-3 Fatty Acids (FISH OIL PO), Take 1 tablet by mouth daily., Disp: , Rfl:  .  ramipril (ALTACE) 10 MG capsule, Take 10 mg by mouth 2 (two) times daily., Disp: , Rfl:  .  ranibizumab (LUCENTIS) 0.5 MG/0.05ML SOLN, 0.5 mg by Intravitreal route See admin instructions. Every 9 weeks, Disp: , Rfl:  .  spironolactone (ALDACTONE) 25 MG tablet, Take 25 mg by mouth daily., Disp: , Rfl:  .  warfarin (COUMADIN) 5 MG tablet, Take 5-7.5 mg by mouth See admin instructions. Take 1 and 1/2 tablets on Monday then take 1 tablet all the other days, Disp: , Rfl:   Past Medical History: Past Medical History:  Diagnosis Date  . A-fib ([place])   . AAA (abdominal aortic aneurysm) ([place])    s/p repair  . Aortic stenosis, mild    a. mean gradient 17 mmHg on 04/2012 TTE  . Arthritis    knees  . CAD (coronary artery disease)    a. s/p multiple percutaneous prior coronary artery interventions b. 05/2012: unsuccessful PCI to tight RCA->medically managed  . Cancer [facility])    skin cancer removed, right hand, left leg, chest  . Carotid artery disease ([place])    0-39% bilateral ICA stenoses 11/2011  . Chronic anticoagulation    Coumadin  . Coronary artery disease   . GERD (gastroesophageal reflux  disease)   . GERD (gastroesophageal reflux disease)   . History of pulmonary embolism    1964 related to dislocated hip on right   . History of skin cancer   . Hypercholesteremia   . Hypertension   . Incidental cecal mass noted on CT imaging 05/24/2016   **An incidental finding of potential clinical significance has been found. 4.6 x 5.6 x 3.7 cm masslike lesion in the cecum adjacent to the ileocecal valve may represent a large polyp or colonic neoplasm. Correlation with nonemergent colonoscopy is strongly recommended in the near future to better evaluate this finding.**  . Macular degeneration    eye injections  . Microscopic hematuria    Followed by urology  . Permanent atrial fibrillation   . PVD (peripheral  vascular disease) ([place])    Bilateral renal artery atherosclerosis, SMA stenosis with collateralization  . RBBB    Chronic  . 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 SSS (sick sinus syndrome) ([facility])     Tobacco Use: Social History   Tobacco Use  Smoking Status Never Smoker  Smokeless Tobacco Never Used    Labs: Recent Review Flowsheet Data    Labs for ITP Cardiac and Pulmonary Rehab Latest Ref Rng & Units 06/13/2016 06/13/2016 06/13/2016 06/13/2016 08/28/2017   Cholestrol 0 - 200 mg/dL - - - - 149   LDLCALC 0 - 99 mg/dL - - - - 78   HDL >40 mg/dL - - - - 52   Trlycerides <150 mg/dL - - - - 95   Hemoglobin A1c 4.8 - 5.6 % - - - - -   PHART 7.350 - 7.450 - - - 7.347(L) -   PCO2ART 32.0 - 48.0 mmHg - - - 40.6 -   HCO3 20.0 - 28.0 mmol/L - - - 22.5 -   TCO2 0 - 100 mmol/L _0 -   ACIDBASEDEF 0.0 - 2.0 mmol/L - - - 3.0(H) -   O2SAT % - - - 95.0 -      Capillary Blood Glucose: No results found for: GLUCAP   Exercise Target Goals: Exercise Program Goal: Individual exercise prescription set using results from initial 6 min walk test and THRR while considering  patient's activity barriers and safety.   Exercise Prescription Goal: Initial exercise prescription builds to 30-45 minutes a day of aerobic activity, 2-3 days per week.  Home exercise guidelines will be given to patient during program as part of exercise prescription that the participant will acknowledge.  Activity Barriers & Risk Stratification: Activity Barriers & Cardiac Risk Stratification - 12/13/17 1037      Activity Barriers & Cardiac Risk Stratification   Activity Barriers  Deconditioning    Cardiac Risk Stratification  High       6 Minute Walk: 6 Minute Walk    Row Name 12/13/17 1036 03/11/18 1357       6 Minute Walk   Phase  Initial  Discharge    Distance  1105 feet  1438 feet     Distance % Change  -  30.14 %    Walk Time  6 minutes  6 minutes    # of Rest Breaks  0  0    MPH  2.09  2.72    METS  1.17  2.16    RPE  12  11    Perceived Dyspnea   -  0    VO2 Peak  4.09  7.54    Symptoms  No  No    Resting HR  56 bpm  57 bpm    Resting BP  124/60  132/64    Resting Oxygen Saturation   95 %  -    Exercise Oxygen Saturation  during 6 min walk  94 %  -    Max Ex. HR  71 bpm  83 bpm    Max Ex. BP  140/80  160/62    2 Minute Post BP  126/53  132/60       Oxygen Initial Assessment:   Oxygen Re-Evaluation:   Oxygen Discharge (Final Oxygen Re-Evaluation):   Initial Exercise Prescription: Initial Exercise Prescription - 12/13/17 1100      Date of Initial Exercise RX and Referring Provider   Date  12/12/17    Referring Provider  Dr. Burt Knack     Expected Discharge Date  03/22/18      NuStep   Level  2    SPM  75    Minutes  10    METs  2      Arm Ergometer   Level  2    Watts  20    Minutes  10    METs  2.1      Track   Laps  7    Minutes  10    METs  2.23      Prescription Details   Frequency (times per week)  3    Duration  Progress to 30 minutes of continuous aerobic without signs/symptoms of physical distress      Intensity   THRR 40-80% of Max Heartrate  52-104    Ratings of Perceived Exertion  11-13      Progression   Progression  Continue to progress workloads to maintain intensity without signs/symptoms of physical distress.      Resistance Training   Training Prescription  Yes    Weight  2 lbs    Reps  10-15       Perform Capillary Blood Glucose checks as needed.  Exercise Prescription Changes:  Exercise Prescription Changes    Row Name 01/01/18 1536 01/04/18 1435 01/18/18 1000 02/12/18 1500 03/18/18 1500     Response to Exercise   Blood Pressure (Admit)  132/60  130/70  138/70  136/60  126/72   Blood Pressure (Exercise)  140/70  148/64  128/70  140/60  138/58   Blood Pressure (Exit)  142/68  126/60  120/70  148/72   120/60   Heart Rate (Admit)  72 bpm  78 bpm  53 bpm  72 bpm  57 bpm   Heart Rate (Exercise)  87 bpm  96 bpm  72 bpm  80 bpm  86 bpm   Heart Rate (Exit)  65 bpm  66 bpm  56 bpm  72 bpm  57 bpm   Rating of Perceived Exertion (Exercise)  _0 Symptoms  -  -  -  -  None   Duration  Continue with 30 min of aerobic exercise without signs/symptoms of physical distress.  Continue with 30 min of aerobic exercise without signs/symptoms of physical distress.  Continue with 30 min of aerobic exercise without signs/symptoms of physical distress.  Continue with 30 min of aerobic exercise without signs/symptoms of physical distress.  Continue with 30 min of aerobic exercise without signs/symptoms of physical distress.  Intensity  THRR New  THRR New  THRR New  THRR New  THRR New     Progression   Progression  Continue to progress workloads to maintain intensity without signs/symptoms of physical distress.  Continue to progress workloads to maintain intensity without signs/symptoms of physical distress.  Continue to progress workloads to maintain intensity without signs/symptoms of physical distress.  Continue to progress workloads to maintain intensity without signs/symptoms of physical distress.  Continue to progress workloads to maintain intensity without signs/symptoms of physical distress.   Average METs  2.2  2.2  2.3  2.7  2.7     Resistance Training   Training Prescription  Yes  Yes  No  Yes  Yes   Weight  2 lbs  2 lbs  -  2 lbs.   3 lbs.    Reps  10-15  10-15  -  10-15  10-15   Time  10 Minutes  10 Minutes  -  10 Minutes  10 Minutes     NuStep   Level  _0 SPM  75  75  75  75  75   Minutes  _1 METs  2  1.7  2.4  2.4  2.4     Arm Ergometer   Level  _2 Watts  _3 Minutes  _4 METs  2.17  2.4  2.23  2.75  2.28     Track   Laps  _5 Minutes  _6 METs  2.22  2.22  2.22   3.3  3.45     Home Exercise Plan   Plans to continue exercise at  -  Home (comment) Walking 2-3 days for 30 minutes per day.   Home (comment) Walking 2-3 days for 30 minutes per day.   Home (comment) Walking 2-3 days for 30 minutes per day.   Home (comment) Walking 2-3 days for 30 minutes per day.    Frequency  -  Add 3 additional days to program exercise sessions.  Add 3 additional days to program exercise sessions.  Add 3 additional days to program exercise sessions.  Add 3 additional days to program exercise sessions.   Initial Home Exercises Provided  -  01/04/18  01/04/18  01/04/18  01/04/18   Row Name 03/29/18 1500             Response to Exercise   Blood Pressure (Admit)  120/60       Blood Pressure (Exercise)  140/72       Blood Pressure (Exit)  130/70       Heart Rate (Admit)  60 bpm       Heart Rate (Exercise)  127 bpm       Heart Rate (Exit)  58 bpm       Rating of Perceived Exertion (Exercise)  11       Symptoms  None       Comments  Pt graduated Cardiac Rehab today.        Duration  Continue with 30 min of aerobic exercise without signs/symptoms of physical distress.  Intensity  THRR New         Progression   Progression  Continue to progress workloads to maintain intensity without signs/symptoms of physical distress.       Average METs  2.7         Resistance Training   Training Prescription  Yes       Weight  3 lbs.        Reps  10-15       Time  10 Minutes         NuStep   Level  6       SPM  75       Minutes  10       METs  2.2         Arm Ergometer   Level  2       Watts  20       Minutes  10       METs  2.28         Track   Laps  14       Minutes  10       METs  3.45         Home Exercise Plan   Plans to continue exercise at  Home (comment) Walking 2-3 days for 30 minutes per day.        Frequency  Add 3 additional days to program exercise sessions.       Initial Home Exercises Provided  01/04/18          Exercise  Comments:  Exercise Comments    Row Name 01/02/18 1445 01/04/18 1439 01/23/18 1031 02/12/18 1554 03/21/18 1046   Exercise Comments  Reviewed METs and goals with Pt. Pt will continue exercise at home in additoin to cardiac rehab.   Reviewed HEP with Pt. Responded well.   Reviewed METs and goals with Pt. Pt was responsive and understood goals.   Reviewed METs and goals with Pt. Pt was responsive and understood goals.   Reviewed METs and goals with Pt. Pt was responsive and understood goals.    [place] Name 03/29/18 1509           Exercise Comments  Pt graduated Cardiac Rehab today. Pt progressed well.           Exercise Goals and Review:  Exercise Goals    Row Name 12/13/17 1038             Exercise Goals   Increase Physical Activity  Yes       Intervention  Develop an individualized exercise prescription for aerobic and resistive training based on initial evaluation findings, risk stratification, comorbidities and participant's personal goals.;Provide advice, education, support and counseling about physical activity/exercise needs.       Expected Outcomes  Short Term: Attend rehab on a regular basis to increase amount of physical activity.       Increase Strength and Stamina  Yes       Intervention  Provide advice, education, support and counseling about physical activity/exercise needs.;Develop an individualized exercise prescription for aerobic and resistive training based on initial evaluation findings, risk stratification, comorbidities and participant's personal goals.       Expected Outcomes  Short Term: Increase workloads from initial exercise prescription for resistance, speed, and METs.       Able to understand and use rate of perceived exertion (RPE) scale  Yes       Intervention  Provide education and explanation on  how to use RPE scale       Expected Outcomes  Short Term: Able to use RPE daily in rehab to express subjective intensity level;Long Term:  Able to use RPE to guide  intensity level when exercising independently       Knowledge and understanding of Target Heart Rate Range (THRR)  Yes       Intervention  Provide education and explanation of THRR including how the numbers were predicted and where they are located for reference       Expected Outcomes  Short Term: Able to state/look up THRR;Long Term: Able to use THRR to govern intensity when exercising independently;Short Term: Able to use daily as guideline for intensity in rehab       Able to check pulse independently  Yes       Intervention  Provide education and demonstration on how to check pulse in carotid and radial arteries.;Review the importance of being able to check your own pulse for safety during independent exercise       Expected Outcomes  Short Term: Able to explain why pulse checking is important during independent exercise;Long Term: Able to check pulse independently and accurately       Understanding of Exercise Prescription  Yes       Intervention  Provide education, explanation, and written materials on patient's individual exercise prescription       Expected Outcomes  Short Term: Able to explain program exercise prescription;Long Term: Able to explain home exercise prescription to exercise independently          Exercise Goals Re-Evaluation : Exercise Goals Re-Evaluation    Row Name 01/02/18 1443 01/04/18 1437 01/23/18 1030 02/12/18 1553 03/21/18 1045     Exercise Goal Re-Evaluation   Exercise Goals Review  Increase Physical Activity;Able to understand and use rate of perceived exertion (RPE) scale;Knowledge and understanding of Target Heart Rate Range (THRR);Understanding of Exercise Prescription;Increase Strength and Stamina;Able to check pulse independently  Increase Physical Activity;Increase Strength and Stamina;Able to check pulse independently;Understanding of Exercise Prescription;Knowledge and understanding of Target Heart Rate Range (THRR);Able to understand and use rate of  perceived exertion (RPE) scale  Increase Physical Activity;Increase Strength and Stamina;Able to check pulse independently;Understanding of Exercise Prescription;Knowledge and understanding of Target Heart Rate Range (THRR);Able to understand and use rate of perceived exertion (RPE) scale  Increase Physical Activity;Increase Strength and Stamina;Able to check pulse independently;Understanding of Exercise Prescription;Knowledge and understanding of Target Heart Rate Range (THRR);Able to understand and use rate of perceived exertion (RPE) scale  Increase Physical Activity;Increase Strength and Stamina;Able to check pulse independently;Understanding of Exercise Prescription;Knowledge and understanding of Target Heart Rate Range (THRR);Able to understand and use rate of perceived exertion (RPE) scale   Comments  Reviewed METs and goals with Pt. Pt MET level is 2.2. Pt is biking 15 minutes 3 days per week in addition to cardiac rehab.   Reviewed HEP with Pt. Pt responded well to goals, exercise precautions, tmeperature regulations, and THRR. Pt will exercise 2-3 days per week for 30-45 minutes per day in addition to cardiac rehab.   Reviewed METs and goals with Pt. MET level is 2.3. Pt stated he is not exercising much at home. Recommended Pt to increase exercise at home to 2-3 days for 30-45 minutes in addition to Cardiac Rehab.   Reviewed METs and goals with Pt. MET level is 2.7 and continues to increase each session. Pt is tolerating exercise Rx and progressing well.   Reviewed METs and goals  with Pt. MET level is 2.7 and continues to increase each session. Pt is tolerating exercise Rx and progressing well. Pt stated he had an airdyne bike and would start using it for 20 minutes 2-4 days per week in addition to the program.    Expected Outcomes  Will continue to monitor and progress Pt as tolerated.   Will monitor and progress Pt as tolerated.   Will monitor and progress Pt as tolerated.   Will monitor and progress  Pt as tolerated.   Will monitor and progress Pt as tolerated.    [place] Name 03/29/18 1507             Exercise Goal Re-Evaluation   Exercise Goals Review  Increase Physical Activity;Increase Strength and Stamina;Able to check pulse independently;Understanding of Exercise Prescription;Knowledge and understanding of Target Heart Rate Range (THRR);Able to understand and use rate of perceived exertion (RPE) scale       Comments  Pt graduated Cardiac Rehab today. Pt progressed through the program well and has a MET level of 2.7. Pt is moving to a senior living center where he will be using the gym to continue his exercise Rx.        Expected Outcomes  Pt will continue exercising at the gym at the senior living center he will be moving to.           Discharge Exercise Prescription (Final Exercise Prescription Changes): Exercise Prescription Changes - 03/29/18 1500      Response to Exercise   Blood Pressure (Admit)  120/60    Blood Pressure (Exercise)  140/72    Blood Pressure (Exit)  130/70    Heart Rate (Admit)  60 bpm    Heart Rate (Exercise)  127 bpm    Heart Rate (Exit)  58 bpm    Rating of Perceived Exertion (Exercise)  11    Symptoms  None    Comments  Pt graduated Cardiac Rehab today.     Duration  Continue with 30 min of aerobic exercise without signs/symptoms of physical distress.    Intensity  THRR New      Progression   Progression  Continue to progress workloads to maintain intensity without signs/symptoms of physical distress.    Average METs  2.7      Resistance Training   Training Prescription  Yes    Weight  3 lbs.     Reps  10-15    Time  10 Minutes      NuStep   Level  6    SPM  75    Minutes  10    METs  2.2      Arm Ergometer   Level  2    Watts  20    Minutes  10    METs  2.28      Track   Laps  14    Minutes  10    METs  3.45      Home Exercise Plan   Plans to continue exercise at  Home (comment)   Walking 2-3 days for 30 minutes per day.     Frequency  Add 3 additional days to program exercise sessions.    Initial Home Exercises Provided  01/04/18       Nutrition:  Target Goals: Understanding of nutrition guidelines, daily intake of sodium <1539m, cholesterol <2082m calories 30% from fat and 7% or less from saturated fats, daily to have 5 or more servings of fruits and vegetables.  Biometrics: Pre Biometrics - 12/13/17 1038      Pre Biometrics   Height  _0  (1.727 m)    Weight  88.9 kg    Waist Circumference  41 inches    Hip Circumference  40 inches    Waist to Hip Ratio  1.02 %    BMI (Calculated)  29.81    Triceps Skinfold  19 mm    % Body Fat  30.5 %    Grip Strength  30 kg    Flexibility  7 in    Single Leg Stand  0 seconds      Post Biometrics - 03/11/18 1520       Post  Biometrics   Waist Circumference  39.5 inches    Hip Circumference  38.75 inches    Waist to Hip Ratio  1.02 %    Triceps Skinfold  11 mm    Grip Strength  33.5 kg    Flexibility  9.5 in    Single Leg Stand  0 seconds       Nutrition Therapy Plan and Nutrition Goals: Nutrition Therapy & Goals - 12/13/17 0947      Nutrition Therapy   Diet  heart healthy      Personal Nutrition Goals   Nutrition Goal  Pt to identify and limit food sources of saturated fat, trans fat, refined carbohydrates and sodium    Personal Goal #2  Pt to eat a variety of non-starchy vegetables    Personal Goal #3  Pt to make better choices when eating out    Personal Goal #4  Pt to decrease intake of fried foods      Intervention Plan   Intervention  Prescribe, educate and counsel regarding individualized specific dietary modifications aiming towards targeted core components such as weight, hypertension, lipid management, diabetes, heart failure and other comorbidities.    Expected Outcomes  Short Term Goal: Understand basic principles of dietary content, such as calories, fat, sodium, cholesterol and nutrients.;Long Term Goal: Adherence to prescribed  nutrition plan.       Nutrition Assessments: Nutrition Assessments - 12/13/17 0949      MEDFICTS Scores   Pre Score  69       Nutrition Goals Re-Evaluation:   Nutrition Goals Re-Evaluation:   Nutrition Goals Discharge (Final Nutrition Goals Re-Evaluation):   Psychosocial: Target Goals: Acknowledge presence or absence of significant depression and/or stress, maximize coping skills, provide positive support system. Participant is able to verbalize types and ability to use techniques and skills needed for reducing stress and depression.  Initial Review & Psychosocial Screening: Initial Psych Review & Screening - 12/13/17 1122      Initial Review   Current issues with  None Identified      Family Dynamics   Good Support System?  Yes   family      Barriers   Psychosocial barriers to participate in program  There are no identifiable barriers or psychosocial needs.      Screening Interventions   Interventions  Encouraged to exercise       Quality of Life Scores: Quality of Life - 12/13/17 1048      Quality of Life   Select  Quality of Life      Quality of Life Scores   Health/Function Pre  27.6 %    Socioeconomic Pre  30 %    Psych/Spiritual Pre  29.14 %    Family Pre  28.8 %    GLOBAL Pre  28.63 %      Scores of 19 and below usually indicate a poorer quality of life in these areas.  A difference of  2-3 points is a clinically meaningful difference.  A difference of 2-3 points in the total score of the Quality of Life Index has been associated with significant improvement in overall quality of life, self-image, physical symptoms, and general health in studies assessing change in quality of life.  PHQ-9: Recent Review Flowsheet Data    Depression screen [facility] 2/9 12/17/2017   Decreased Interest 0   Down, Depressed, Hopeless 0   PHQ - 2 Score 0     Interpretation of Total Score  Total Score Depression Severity:  1-4 = Minimal depression, 5-9 = Mild depression,  10-14 = Moderate depression, 15-19 = Moderately severe depression, 20-27 = Severe depression   Psychosocial Evaluation and Intervention: Psychosocial Evaluation - 03/29/18 1528      Psychosocial Evaluation & Interventions   Interventions  Encouraged to exercise with the program and follow exercise prescription    Comments  no psychosocial needs identified, no interventions necessary. pt unable to participate in former hobbies due to vision loss. pt currently enjoys container gardening.  pt has very supportive family.    Expected Outcomes  pt will exhibit positive outlook with good coping skills.     Continue Psychosocial Services   No Follow up required      Discharge Psychosocial Assessment & Intervention   Comments  no psychosocial needs identified, no interventions necessary .        Psychosocial Re-Evaluation: Psychosocial Re-Evaluation    Row Name 12/26/17 1436 01/03/18 1209 01/18/18 1526 02/18/18 0728 03/21/18 0932     Psychosocial Re-Evaluation   Current issues with  None Identified  None Identified  None Identified  None Identified  None Identified   Comments  no psychosocial needs identified, no interventions necessary   no psychosocial needs identified, no interventions necessary   no psychosocial needs identified, no interventions necessary   no psychosocial needs identified, no interventions necessary   no psychosocial needs identified, no interventions necessary    Expected Outcomes  pt will exhibit positive outlook with good coping skills.   pt will exhibit positive outlook with good coping skills.   pt will exhibit positive outlook with good coping skills.   pt will exhibit positive outlook with good coping skills.   pt will exhibit positive outlook with good coping skills.    Interventions  Encouraged to attend Cardiac Rehabilitation for the exercise  Encouraged to attend Cardiac Rehabilitation for the exercise  Encouraged to attend Cardiac Rehabilitation for the exercise   Encouraged to attend Cardiac Rehabilitation for the exercise  Encouraged to attend Cardiac Rehabilitation for the exercise   Continue Psychosocial Services   No Follow up required  No Follow up required  No Follow up required  No Follow up required  No Follow up required      Psychosocial Discharge (Final Psychosocial Re-Evaluation): Psychosocial Re-Evaluation - 03/21/18 0821      Psychosocial Re-Evaluation   Current issues with  None Identified    Comments  no psychosocial needs identified, no interventions necessary     Expected Outcomes  pt will exhibit positive outlook with good coping skills.     Interventions  Encouraged to attend Cardiac Rehabilitation for the exercise    Continue Psychosocial Services   No Follow up required       Vocational Rehabilitation: Provide vocational rehab assistance to qualifying candidates.  Vocational Rehab Evaluation & Intervention: Vocational Rehab - 12/13/17 1123      Initial Vocational Rehab Evaluation & Intervention   Assessment shows need for Vocational Rehabilitation  No   retired Marketing executive      Education: Education Goals: Education classes will be provided on a weekly basis, covering required topics. Participant will state understanding/return demonstration of topics presented.  Learning Barriers/Preferences: Learning Barriers/Preferences - 12/12/17 1521      Learning Barriers/Preferences   Learning Barriers  Sight   macular degeneration   Learning Preferences  Written Material;Verbal Instruction       Education Topics: Count Your Pulse:  -Group instruction provided by verbal instruction, demonstration, patient participation and written materials to support subject.  Instructors address importance of being able to find your pulse and how to count your pulse when at home without a heart monitor.  Patients get hands on experience counting their pulse with staff help and individually.   Heart Attack, Angina, and Risk  Factor Modification:  -Group instruction provided by verbal instruction, video, and written materials to support subject.  Instructors address signs and symptoms of angina and heart attacks.    Also discuss risk factors for heart disease and how to make changes to improve heart health risk factors.   Functional Fitness:  -Group instruction provided by verbal instruction, demonstration, patient participation, and written materials to support subject.  Instructors address safety measures for doing things around the house.  Discuss how to get up and down off the floor, how to pick things up properly, how to safely get out of a chair without assistance, and balance training.   Meditation and Mindfulness:  -Group instruction provided by verbal instruction, patient participation, and written materials to support subject.  Instructor addresses importance of mindfulness and meditation practice to help reduce stress and improve awareness.  Instructor also leads participants through a meditation exercise.    Stretching for Flexibility and Mobility:  -Group instruction provided by verbal instruction, patient participation, and written materials to support subject.  Instructors lead participants through series of stretches that are designed to increase flexibility thus improving mobility.  These stretches are additional exercise for major muscle groups that are typically performed during regular warm up and cool down.   Hands Only CPR:  -Group verbal, video, and participation provides a basic overview of AHA guidelines for community CPR. Role-play of emergencies allow participants the opportunity to practice calling for help and chest compression technique with discussion of AED use.   Hypertension: -Group verbal and written instruction that provides a basic overview of hypertension including the most recent diagnostic guidelines, risk factor reduction with self-care instructions and medication  management.    Nutrition I class: Heart Healthy Eating:  -Group instruction provided by PowerPoint slides, verbal discussion, and written materials to support subject matter. The instructor gives an explanation and review of the Therapeutic Lifestyle Changes diet recommendations, which includes a discussion on lipid goals, dietary fat, sodium, fiber, plant stanol/sterol esters, sugar, and the components of a well-balanced, healthy diet.   Nutrition II class: Lifestyle Skills:  -Group instruction provided by PowerPoint slides, verbal discussion, and written materials to support subject matter. The instructor gives an explanation and review of label reading, grocery shopping for heart health, heart healthy recipe modifications, and ways to make healthier choices when eating out.   Diabetes Question & Answer:  -Group instruction provided by PowerPoint slides, verbal discussion, and written materials to support subject matter. The instructor gives an explanation and review  of diabetes co-morbidities, pre- and post-prandial blood glucose goals, pre-exercise blood glucose goals, signs, symptoms, and treatment of hypoglycemia and hyperglycemia, and foot care basics.   Diabetes Blitz:  -Group instruction provided by PowerPoint slides, verbal discussion, and written materials to support subject matter. The instructor gives an explanation and review of the physiology behind type 1 and type 2 diabetes, diabetes medications and rational behind using different medications, pre- and post-prandial blood glucose recommendations and Hemoglobin A1c goals, diabetes diet, and exercise including blood glucose guidelines for exercising safely.    Portion Distortion:  -Group instruction provided by PowerPoint slides, verbal discussion, written materials, and food models to support subject matter. The instructor gives an explanation of serving size versus portion size, changes in portions sizes over the last 20 years,  and what consists of a serving from each food group.   Stress Management:  -Group instruction provided by verbal instruction, video, and written materials to support subject matter.  Instructors review role of stress in heart disease and how to cope with stress positively.     Exercising on Your Own:  -Group instruction provided by verbal instruction, power point, and written materials to support subject.  Instructors discuss benefits of exercise, components of exercise, frequency and intensity of exercise, and end points for exercise.  Also discuss use of nitroglycerin and activating EMS.  Review options of places to exercise outside of rehab.  Review guidelines for sex with heart disease.   Cardiac Drugs I:  -Group instruction provided by verbal instruction and written materials to support subject.  Instructor reviews cardiac drug classes: antiplatelets, anticoagulants, beta blockers, and statins.  Instructor discusses reasons, side effects, and lifestyle considerations for each drug class.   Cardiac Drugs II:  -Group instruction provided by verbal instruction and written materials to support subject.  Instructor reviews cardiac drug classes: angiotensin converting enzyme inhibitors (ACE-I), angiotensin II receptor blockers (ARBs), nitrates, and calcium channel blockers.  Instructor discusses reasons, side effects, and lifestyle considerations for each drug class.   CARDIAC REHAB PHASE II EXERCISE from 01/16/2018 in [place]  Date  01/16/18  Educator  Pharmacist  Instruction Review Code  2- Demonstrated Understanding      Anatomy and Physiology of the Circulatory System:  Group verbal and written instruction and models provide basic cardiac anatomy and physiology, with the coronary electrical and arterial systems. Review of: AMI, Angina, Valve disease, Heart Failure, Peripheral Artery Disease, Cardiac Arrhythmia, Pacemakers, and the ICD.   Other  Education:  -Group or individual verbal, written, or video instructions that support the educational goals of the cardiac rehab program.   Holiday Eating Survival Tips:  -Group instruction provided by PowerPoint slides, verbal discussion, and written materials to support subject matter. The instructor gives patients tips, tricks, and techniques to help them not only survive but enjoy the holidays despite the onslaught of food that accompanies the holidays.   Knowledge Questionnaire Score: Knowledge Questionnaire Score - 12/13/17 1039      Knowledge Questionnaire Score   Pre Score  14/24       Core Components/Risk Factors/Patient Goals at Admission: Personal Goals and Risk Factors at Admission - 12/13/17 1053      Core Components/Risk Factors/Patient Goals on Admission    Weight Management  Yes;Weight Maintenance;Weight Loss    Admit Weight  195 lb 15.8 oz (88.9 kg)    Expected Outcomes  Short Term: Continue to assess and modify interventions until short term weight is achieved;Long Term: Adherence  to nutrition and physical activity/exercise program aimed toward attainment of established weight goal;Weight Maintenance: Understanding of the daily nutrition guidelines, which includes 25-35% calories from fat, 7% or less cal from saturated fats, less than 277m cholesterol, less than 1.5gm of sodium, & 5 or more servings of fruits and vegetables daily;Weight Loss: Understanding of general recommendations for a balanced deficit meal plan, which promotes 1-2 lb weight loss per week and includes a negative energy balance of [phone] kcal/d;Understanding recommendations for meals to include 15-35% energy as protein, 25-35% energy from fat, 35-60% energy from carbohydrates, less than 2027mof dietary cholesterol, 20-35 gm of total fiber daily;Understanding of distribution of calorie intake throughout the day with the consumption of 4-5 meals/snacks    Hypertension  Yes    Intervention  Provide  education on lifestyle modifcations including regular physical activity/exercise, weight management, moderate sodium restriction and increased consumption of fresh fruit, vegetables, and low fat dairy, alcohol moderation, and smoking cessation.;Monitor prescription use compliance.    Expected Outcomes  Short Term: Continued assessment and intervention until BP is < 140/9072mG in hypertensive participants. < 130/10m17m in hypertensive participants with diabetes, heart failure or chronic kidney disease.;Long Term: Maintenance of blood pressure at goal levels.    Intervention  Provide education and support for participant on nutrition & aerobic/resistive exercise along with prescribed medications to achieve LDL <70mg26mL >40mg.46mExpected Outcomes  Short Term: Participant states understanding of desired cholesterol values and is compliant with medications prescribed. Participant is following exercise prescription and nutrition guidelines.;Long Term: Cholesterol controlled with medications as prescribed, with individualized exercise RX and with personalized nutrition plan. Value goals: LDL < 70mg, 41m> 40 mg.       Core Components/Risk Factors/Patient Goals Review:  Goals and Risk Factor Review    Row Name 12/17/17 1421 01/03/18 1210 01/18/18 1527 02/18/18 0728 03/21/18 0821     Core Components/Risk Factors/Patient Goals Review   Personal Goals Review  Weight Management/Obesity;Hypertension;Lipids  Weight Management/Obesity;Hypertension;Lipids  Weight Management/Obesity;Hypertension;Lipids  Weight Management/Obesity;Hypertension;Lipids  Weight Management/Obesity;Hypertension;Lipids   Review  pt with CAD RF demonstrates eagerness to participate in CR program. pt personal goals are to lose weight and improve overall health/well being. pt would like to restore his recent funcational decline.    pt with CAD RF demonstrates eagerness to participate in CR program. pt personal goals are to lose weight and  improve overall health/well being. pt would like to improve upon his recent funcational decline and restore activity level to baseline prior to cardiac event.   pt with CAD RF demonstrates eagerness to participate in CR program. pt personal goals are to lose weight and improve overall health/well being. pt using stationary bike at home. pt states he enjoys walking and talking at CR.    pt with CAD RF demonstrates eagerness to participate in CR program. pt personal goals are to lose weight and improve overall health/well being. pt using stationary bike and walking 1/2 mile at home.  pt states he enjoys walking and talking at CR.    pt with CAD RF demonstrates eagerness to participate in CR program. pt personal goals are to lose weight and improve overall health/well being. pt current weight loss is 18 lbs.  pt education reinforced for safe and appropriate weight loss.   pt using stationary bike and walking 1/2 mile at home weather related.  pt states he enjoys walking and talking at CR.     Expected Outcomes  pt will participate in CR  exercise, nutrition and lifestyle modification to decrease overall RF to achieve personal and program goals.   pt will participate in CR exercise, nutrition and lifestyle modification to decrease overall RF to achieve personal and program goals.   pt will participate in CR exercise, nutrition and lifestyle modification to decrease overall RF to achieve personal and program goals.   pt will participate in CR exercise, nutrition and lifestyle modification to decrease overall RF to achieve personal and program goals.   pt will participate in CR exercise, nutrition and lifestyle modification to decrease overall RF to achieve personal and program goals.    [place] Name 03/29/18 1528             Core Components/Risk Factors/Patient Goals Review   Personal Goals Review  Weight Management/Obesity;Hypertension;Lipids       Review  pt with CAD RF demonstrates eagerness to participate in CR  program. pt personal goals are to lose weight and improve overall health/well being. pt current weight loss is 18 lbs.  pt education reinforced for safe and appropriate weight loss.   pt using stationary bike and walking 1/2 mile at home weather related.  pt most pleased with increased strength/stamina, increased ability and feels better.         Expected Outcomes  pt will participate in exercise, nutrition and lifestyle modification to decrease overall RF on his own the community.           Core Components/Risk Factors/Patient Goals at Discharge (Final Review):  Goals and Risk Factor Review - 03/29/18 1528      Core Components/Risk Factors/Patient Goals Review   Personal Goals Review  Weight Management/Obesity;Hypertension;Lipids    Review  pt with CAD RF demonstrates eagerness to participate in CR program. pt personal goals are to lose weight and improve overall health/well being. pt current weight loss is 18 lbs.  pt education reinforced for safe and appropriate weight loss.   pt using stationary bike and walking 1/2 mile at home weather related.  pt most pleased with increased strength/stamina, increased ability and feels better.      Expected Outcomes  pt will participate in exercise, nutrition and lifestyle modification to decrease overall RF on his own the community.        ITP Comments: ITP Comments    Row Name 12/12/17 1519 12/13/17 8638 12/17/17 1419 01/03/18 1209 01/18/18 1526   ITP Comments  Dr.Trenia Tennyson Radford Pax, Medical Director   Dr.Kortni Hasten, Medical Director   pt started group exercise. pt tolerated light activity without difficulty. pt oriented to exercise equipment and safety routine.  pt verbalized understanding.   30 day ITP review. pt with good attendance and participation.  pt demonstrates eagerness to partcipate in CR program.   30 day ITP review. pt with good attendance and participation.  pt demonstrates eagerness to partcipate in [place] program.    [place] Name 02/20/18 1644  03/21/18 0821 03/29/18 1526       ITP Comments  30 day ITP review. pt with good attendance and participation.  pt recent absences from holiday family plans. pt demonstrates eagerness to partcipate in CR program.   30 day ITP review. pt with good attendance and participation.  pt recent absences from holiday family plans. pt demonstrates eagerness to partcipate in CR program.   pt graduated from [facility] program with 36 sessions. pt making significant lifestyle changes and congratulated on his success.          Comments:

## 2018-03-22 ENCOUNTER — Encounter (HOSPITAL_COMMUNITY)
Admission: RE | Admit: 2018-03-22 | Discharge: 2018-03-22 | Disposition: A | Discharge status: Home / Self Care | Payer: Medicare Other | Source: Ambulatory Visit | Attending: Cardiovascular Disease | Admitting: Cardiovascular Disease

## 2018-03-22 DIAGNOSIS — Z9861 Coronary angioplasty status: Secondary | ICD-10-CM | POA: Diagnosis not present

## 2018-03-25 ENCOUNTER — Telehealth (HOSPITAL_COMMUNITY): Payer: Self-pay | Admitting: Cardiac Rehabilitation

## 2018-03-25 ENCOUNTER — Encounter (HOSPITAL_COMMUNITY)
Admission: RE | Admit: 2018-03-25 | Discharge: 2018-03-25 | Disposition: A | Discharge status: Home / Self Care | Payer: Medicare Other | Source: Ambulatory Visit | Attending: Cardiovascular Disease | Admitting: Cardiovascular Disease

## 2018-03-25 DIAGNOSIS — Z9861 Coronary angioplasty status: Secondary | ICD-10-CM

## 2018-03-25 NOTE — Telephone Encounter (Signed)
-----  Message from Ivonne Andrew, RD sent at 03/22/2018 11:50 AM EST ----- Regarding: RE: weight loss Alba Destine,  Depends on the goal of the patient. Generally anywhere from .5 - 2 lbs a week for weight loss is considered normal. I spoke with the patient last about reducing fried foods, and foods eaten away from home. He was eating out a lot and eating fried food as well. Patient expressed the desire for weight loss when he came in/we last met. For people with an age greater than 84 it is considered normal to have a BMI between 20-27, if that helps. Parke Simmers felt that it was safe to have a weight loss of 6-24 lbs across a patient stay at cardiac rehab. I would primarily recommend that he is eating regularly across the day, 3 meals minimum, and that he is ensuring to build a healthy plate. I would be concerned if he was not doing this, or started to recently experience any n/v, GI distress, lack of appetite, or difficulty chewing/swallowing as they could be another underlying issue. Does that answer your question?   Healthy Regards,  Rodman Pickle   ----- Message ----- From: Lowell Guitar, RN Sent: 03/21/2018  12:31 PM EST To: Ivonne Andrew, RD Subject: weight loss                                    Dear Rodman Pickle,  I noticed pt has lost 18lb during his time here at CR.  What would your weight loss goal for him be?  With his advanced age, I am concerned he may be losing too much too fast.  I also wonder about proper nutrition in the home.  I know his wife has problems eating. I really look forward to hearing your recommendations  Thank you Andi Hence, RN, BSN Cardiac Pulmonary Rehab

## 2018-03-26 ENCOUNTER — Telehealth (HOSPITAL_COMMUNITY): Payer: Self-pay | Admitting: Cardiac Rehabilitation

## 2018-03-26 NOTE — Telephone Encounter (Signed)
pc received from pt concerned about need to be absent from CR.   Pt states his wife is having radiation therapy on 03/27/2018 and oncology appt on 0214/2020.  Pt would like to be there with her for both those appointments. Pt offered reassurance and emotional support. Pt encouraged to do what he feels is most important and understandable he would want to be present with his wife.  Pt would like to complete final CR visits with extension date to 04/05/2018.  Understanding verbalized. Andi Hence, RN, BSN Cardiac Pulmonary Rehab

## 2018-03-29 ENCOUNTER — Encounter (HOSPITAL_COMMUNITY)
Admission: RE | Admit: 2018-03-29 | Discharge: 2018-03-29 | Disposition: A | Discharge status: Home / Self Care | Payer: Medicare Other | Source: Ambulatory Visit | Attending: Cardiovascular Disease | Admitting: Cardiovascular Disease

## 2018-03-29 ENCOUNTER — Ambulatory Visit (INDEPENDENT_AMBULATORY_CARE_PROVIDER_SITE_OTHER): Payer: Medicare Other | Admitting: Pharmacist

## 2018-03-29 ENCOUNTER — Encounter (HOSPITAL_COMMUNITY): Payer: Self-pay

## 2018-03-29 DIAGNOSIS — Z5181 Encounter for therapeutic drug level monitoring: Secondary | ICD-10-CM

## 2018-03-29 DIAGNOSIS — I4891 Unspecified atrial fibrillation: Secondary | ICD-10-CM

## 2018-03-29 DIAGNOSIS — I4821 Permanent atrial fibrillation: Secondary | ICD-10-CM

## 2018-03-29 DIAGNOSIS — Z9861 Coronary angioplasty status: Secondary | ICD-10-CM | POA: Diagnosis not present

## 2018-03-29 LAB — POCT INR: INR: 3 (ref 2.0–3.0)

## 2018-03-29 NOTE — Patient Instructions (Signed)
Description   Continue taking 1 tablet every day except 1.5 tablets on Mondays.  Recheck INR in 6 weeks.  Call if placed on any new medications or if scheduled for any procedures.  Coumadin clinic [phone].

## 2018-03-29 NOTE — Progress Notes (Signed)
Tyler Williams 85 y.o. male Nutrition Note Spoke with pt. Nutrition plan and goals reviewed with pt. Pt continues to  follow a heart healthy diet. Pt has been working on weight loss, has lost ~18 lbs. Nurse concerned pt not eating regularly across the day. Discussed concerns with pt who expressed that he has been eating more regularly since November beginning of December. Pt shared that he is getting in 3 smaller meals and 1 snack daily. Pt shared he is also ensuring he gets protein and healthy fats in to help manage his hunger. Praised pt for making changes and for all of their hard work, encouraged them to keep it up.  Lab Results  Component Value Date   HGBA1C 5.0 06/09/2016    Wt Readings from Last 3 Encounters:  01/02/18 195 lb 12.8 oz (88.8 kg)  12/13/17 195 lb 15.8 oz (88.9 kg)  09/25/17 186 lb (84.4 kg)    Nutrition Diagnosis   Food-and nutrition-related knowledge deficit related to lack of exposure to information as related to diagnosis of: ?CVD ?CHF  Overweightrelated to excessive energy intake as evidenced by a BMI of 28.29  Excessive sodium intake related to over consumption of processed food as evidenced by frequent consumption of convenience food/ canned vegetables and eating out frequently.  Nutrition Intervention   Pt's individual nutrition plan reviewed with pt.  Benefits of adopting Heart Healthy diet discussed when [employer] reviewed.       Goal(s)  Pt to identify and limit food sources of saturated fat, trans fat, refined carbohydrates and sodium  Pt to eat a variety of non-starchy vegetables  Pt to make better choices when eating out  Pt to decrease intake of fried foods  Plan:  Pt to attend nutrition classes ?Nutrition I ?Nutrition II ?Portion Distortion   Will provide client-centered nutrition education as part of interdisciplinary care  Monitor and evaluate progress toward nutrition goal with team.    Laurina Bustle, MS, RD,  LDN 03/29/2018 2:34 PM

## 2018-04-04 DIAGNOSIS — H353231 Exudative age-related macular degeneration, bilateral, with active choroidal neovascularization: Secondary | ICD-10-CM | POA: Diagnosis not present

## 2018-04-17 DIAGNOSIS — L821 Other seborrheic keratosis: Secondary | ICD-10-CM | POA: Diagnosis not present

## 2018-04-17 DIAGNOSIS — D0461 Carcinoma in situ of skin of right upper limb, including shoulder: Secondary | ICD-10-CM | POA: Diagnosis not present

## 2018-04-17 DIAGNOSIS — L570 Actinic keratosis: Secondary | ICD-10-CM | POA: Diagnosis not present

## 2018-04-17 DIAGNOSIS — Z85828 Personal history of other malignant neoplasm of skin: Secondary | ICD-10-CM | POA: Diagnosis not present

## 2018-04-17 NOTE — Addendum Note (Signed)
Encounter addended by: Lowell Guitar, RN on: 04/17/2018 8:01 AM  Actions taken: Visit diagnoses modified

## 2018-04-22 ENCOUNTER — Encounter (HOSPITAL_COMMUNITY)
Admission: RE | Admit: 2018-04-22 | Discharge: 2018-04-22 | Disposition: A | Discharge status: Home / Self Care | Payer: Self-pay | Source: Ambulatory Visit | Attending: Cardiovascular Disease | Admitting: Cardiovascular Disease

## 2018-04-22 DIAGNOSIS — Z9861 Coronary angioplasty status: Secondary | ICD-10-CM | POA: Insufficient documentation

## 2018-04-24 ENCOUNTER — Encounter (HOSPITAL_COMMUNITY): Payer: Self-pay

## 2018-04-24 DIAGNOSIS — I4821 Permanent atrial fibrillation: Secondary | ICD-10-CM | POA: Diagnosis not present

## 2018-04-24 DIAGNOSIS — I739 Peripheral vascular disease, unspecified: Secondary | ICD-10-CM | POA: Diagnosis not present

## 2018-04-24 DIAGNOSIS — N182 Chronic kidney disease, stage 2 (mild): Secondary | ICD-10-CM | POA: Insufficient documentation

## 2018-04-24 DIAGNOSIS — I130 Hypertensive heart and chronic kidney disease with heart failure and stage 1 through stage 4 chronic kidney disease, or unspecified chronic kidney disease: Secondary | ICD-10-CM | POA: Diagnosis not present

## 2018-04-24 DIAGNOSIS — Z0289 Encounter for other administrative examinations: Secondary | ICD-10-CM | POA: Diagnosis not present

## 2018-04-24 DIAGNOSIS — I2510 Atherosclerotic heart disease of native coronary artery without angina pectoris: Secondary | ICD-10-CM | POA: Diagnosis not present

## 2018-04-24 DIAGNOSIS — Z7901 Long term (current) use of anticoagulants: Secondary | ICD-10-CM | POA: Diagnosis not present

## 2018-04-24 DIAGNOSIS — I509 Heart failure, unspecified: Secondary | ICD-10-CM | POA: Diagnosis not present

## 2018-04-24 DIAGNOSIS — Z6827 Body mass index (BMI) 27.0-27.9, adult: Secondary | ICD-10-CM | POA: Diagnosis not present

## 2018-04-25 ENCOUNTER — Other Ambulatory Visit: Payer: Self-pay

## 2018-04-25 ENCOUNTER — Ambulatory Visit (INDEPENDENT_AMBULATORY_CARE_PROVIDER_SITE_OTHER): Payer: Medicare Other | Admitting: Podiatry

## 2018-04-25 DIAGNOSIS — M79674 Pain in right toe(s): Secondary | ICD-10-CM | POA: Diagnosis not present

## 2018-04-25 DIAGNOSIS — B351 Tinea unguium: Secondary | ICD-10-CM

## 2018-04-25 DIAGNOSIS — M79675 Pain in left toe(s): Secondary | ICD-10-CM | POA: Diagnosis not present

## 2018-04-25 NOTE — Patient Instructions (Signed)

## 2018-04-26 ENCOUNTER — Encounter (HOSPITAL_COMMUNITY)
Admission: RE | Admit: 2018-04-26 | Discharge: 2018-04-26 | Disposition: A | Discharge status: Home / Self Care | Payer: Self-pay | Source: Ambulatory Visit | Attending: Cardiovascular Disease | Admitting: Cardiovascular Disease

## 2018-04-29 ENCOUNTER — Other Ambulatory Visit: Payer: Self-pay | Admitting: Internal Medicine

## 2018-04-29 ENCOUNTER — Encounter (HOSPITAL_COMMUNITY): Payer: Self-pay

## 2018-04-29 ENCOUNTER — Telehealth (HOSPITAL_COMMUNITY): Payer: Self-pay | Admitting: *Deleted

## 2018-04-29 DIAGNOSIS — R9389 Abnormal findings on diagnostic imaging of other specified body structures: Secondary | ICD-10-CM

## 2018-04-29 DIAGNOSIS — I509 Heart failure, unspecified: Secondary | ICD-10-CM

## 2018-04-29 NOTE — Addendum Note (Signed)
Encounter addended by: Lowell Guitar, RN on: 04/29/2018 4:48 PM  Actions taken: Visit diagnoses modified

## 2018-04-29 NOTE — Addendum Note (Signed)
Encounter addended by: Lowell Guitar, RN on: 04/29/2018 4:50 PM  Actions taken: Visit diagnoses modified

## 2018-04-29 NOTE — Addendum Note (Signed)
Encounter addended by: Lowell Guitar, RN on: 04/29/2018 4:24 PM  Actions taken: Visit diagnoses modified

## 2018-04-29 NOTE — Addendum Note (Signed)
Encounter addended by: Lowell Guitar, RN on: 04/29/2018 3:52 PM  Actions taken: Visit diagnoses modified

## 2018-04-29 NOTE — Addendum Note (Signed)
Encounter addended by: Lowell Guitar, RN on: 04/29/2018 4:22 PM  Actions taken: Visit diagnoses modified

## 2018-04-29 NOTE — Addendum Note (Signed)
Encounter addended by: Lowell Guitar, RN on: 04/29/2018 4:27 PM  Actions taken: Visit diagnoses modified

## 2018-04-29 NOTE — Addendum Note (Signed)
Encounter addended by: Lowell Guitar, RN on: 04/29/2018 3:56 PM  Actions taken: Episode resolved, Episode edited

## 2018-04-29 NOTE — Addendum Note (Signed)
Encounter addended by: Lowell Guitar, RN on: 04/29/2018 4:36 PM  Actions taken: Visit diagnoses modified

## 2018-04-29 NOTE — Addendum Note (Signed)
Encounter addended by: Lowell Guitar, RN on: 04/29/2018 4:54 PM  Actions taken: Visit diagnoses modified

## 2018-04-29 NOTE — Addendum Note (Signed)
Encounter addended by: Lowell Guitar, RN on: 04/29/2018 4:31 PM  Actions taken: Visit diagnoses modified

## 2018-04-29 NOTE — Addendum Note (Signed)
Encounter addended by: Lowell Guitar, RN on: 04/29/2018 4:46 PM  Actions taken: Visit diagnoses modified

## 2018-04-29 NOTE — Addendum Note (Signed)
Encounter addended by: Lowell Guitar, RN on: 04/29/2018 4:39 PM  Actions taken: Visit diagnoses modified

## 2018-04-29 NOTE — Addendum Note (Signed)
Encounter addended by: Lowell Guitar, RN on: 04/29/2018 4:33 PM  Actions taken: Visit diagnoses modified

## 2018-04-29 NOTE — Addendum Note (Signed)
Encounter addended by: Lowell Guitar, RN on: 04/29/2018 4:34 PM  Actions taken: Visit diagnoses modified

## 2018-04-29 NOTE — Addendum Note (Signed)
Encounter addended by: Lowell Guitar, RN on: 04/29/2018 4:28 PM  Actions taken: Visit diagnoses modified

## 2018-04-29 NOTE — Addendum Note (Signed)
Encounter addended by: Lowell Guitar, RN on: 04/29/2018 4:49 PM  Actions taken: Visit diagnoses modified

## 2018-04-29 NOTE — Addendum Note (Signed)
Encounter addended by: Lowell Guitar, RN on: 04/29/2018 4:23 PM  Actions taken: Visit diagnoses modified

## 2018-04-29 NOTE — Addendum Note (Signed)
Encounter addended by: Lowell Guitar, RN on: 04/29/2018 4:51 PM  Actions taken: Visit diagnoses modified

## 2018-04-29 NOTE — Addendum Note (Signed)
Encounter addended by: Lowell Guitar, RN on: 04/29/2018 4:30 PM  Actions taken: Visit diagnoses modified

## 2018-04-29 NOTE — Addendum Note (Signed)
Encounter addended by: Lowell Guitar, RN on: 04/29/2018 4:26 PM  Actions taken: Visit diagnoses modified

## 2018-04-29 NOTE — Addendum Note (Signed)
Encounter addended by: Lowell Guitar, RN on: 04/29/2018 4:41 PM  Actions taken: Visit diagnoses modified

## 2018-04-29 NOTE — Addendum Note (Signed)
Encounter addended by: Lowell Guitar, RN on: 04/29/2018 4:47 PM  Actions taken: Visit diagnoses modified

## 2018-04-29 NOTE — Addendum Note (Signed)
Encounter addended by: Lowell Guitar, RN on: 04/29/2018 4:53 PM  Actions taken: Visit diagnoses modified

## 2018-04-29 NOTE — Addendum Note (Signed)
Encounter addended by: Lowell Guitar, RN on: 04/29/2018 4:35 PM  Actions taken: Visit diagnoses modified

## 2018-04-29 NOTE — Addendum Note (Signed)
Encounter addended by: Lowell Guitar, RN on: 04/29/2018 4:25 PM  Actions taken: Visit diagnoses modified

## 2018-04-29 NOTE — Telephone Encounter (Signed)
Contacted patient to notify of Cardiac Rehab department closure x2 weeks. Pt verbalized understanding. Lesa Vandall, Exercise Physiologist Cardiac and Pulmonary Rehabilitation  

## 2018-04-29 NOTE — Addendum Note (Signed)
Encounter addended by: Lowell Guitar, RN on: 04/29/2018 4:32 PM  Actions taken: Visit diagnoses modified

## 2018-04-29 NOTE — Addendum Note (Signed)
Encounter addended by: Lowell Guitar, RN on: 04/29/2018 4:52 PM  Actions taken: Visit diagnoses modified

## 2018-04-29 NOTE — Addendum Note (Signed)
Encounter addended by: Lowell Guitar, RN on: 04/29/2018 4:38 PM  Actions taken: Visit diagnoses modified

## 2018-05-01 ENCOUNTER — Encounter (HOSPITAL_COMMUNITY): Payer: Self-pay

## 2018-05-03 ENCOUNTER — Encounter (HOSPITAL_COMMUNITY): Payer: Self-pay

## 2018-05-06 ENCOUNTER — Other Ambulatory Visit: Payer: Medicare Other

## 2018-05-06 ENCOUNTER — Encounter (HOSPITAL_COMMUNITY): Payer: Self-pay

## 2018-05-06 ENCOUNTER — Encounter: Payer: Self-pay | Admitting: Podiatry

## 2018-05-06 NOTE — Progress Notes (Signed)
Subjective: Tyler Williams is a 85 y.o. y.o. male who is on long term blood thinner warfarin presents today with painful, discolored, thick toenails  which interfere with daily activities. Pain is aggravated when wearing enclosed shoe gear. Pain is relieved with periodic professional debridement.  Patient's PCP is Burnard Bunting, MD .   Current Outpatient Medications:  .  amLODipine (NORVASC) 10 MG tablet, Take 10 mg by mouth daily., Disp: , Rfl:  .  aspirin EC 81 MG tablet, Take 81 mg by mouth daily., Disp: , Rfl:  .  atorvastatin (LIPITOR) 20 MG tablet, TAKE 1 TABLET ONCE DAILY., Disp: 90 tablet, Rfl: 3 .  Calcium-Magnesium-Zinc (CAL-MAG-ZINC PO), Take 1 tablet by mouth daily., Disp: , Rfl:  .  Cholecalciferol (VITAMIN D3) 5000 units TABS, Take 1 tablet by mouth daily., Disp: , Rfl:  .  Coenzyme Q10 (COQ-10) 10 MG CAPS, Take 10 mg by mouth daily., Disp: , Rfl:  .  fesoterodine (TOVIAZ) 8 MG TB24 tablet, Take 8 mg by mouth every evening., Disp: , Rfl:  .  furosemide (LASIX) 40 MG tablet, Take 40 mg by mouth daily., Disp: , Rfl:  .  isosorbide mononitrate (IMDUR) 60 MG 24 hr tablet, Take 1.5 tablets (90 mg total) by mouth daily., Disp: 135 tablet, Rfl: 2 .  LORazepam (ATIVAN) 1 MG tablet, Take 1 mg by mouth at bedtime., Disp: , Rfl:  .  metoprolol succinate (TOPROL-XL) 25 MG 24 hr tablet, Take 1 tablet (25 mg total) by mouth daily., Disp: 90 tablet, Rfl: 3 .  multivitamin-lutein (OCUVITE-LUTEIN) CAPS capsule, Take 1 capsule by mouth daily., Disp: , Rfl:  .  nitroGLYCERIN (NITROSTAT) 0.4 MG SL tablet, Place 0.4 mg under the tongue every 5 (five) minutes as needed for chest pain., Disp: , Rfl:  .  omega-3 acid ethyl esters (LOVAZA) 1 G capsule, Take 1 g by mouth daily., Disp: , Rfl:  .  Omega-3 Fatty Acids (FISH OIL PO), Take 1 tablet by mouth daily., Disp: , Rfl:  .  ramipril (ALTACE) 10 MG capsule, Take 10 mg by mouth 2 (two) times daily., Disp: , Rfl:  .  ranibizumab (LUCENTIS) 0.5  MG/0.05ML SOLN, 0.5 mg by Intravitreal route See admin instructions. Every 9 weeks, Disp: , Rfl:  .  spironolactone (ALDACTONE) 25 MG tablet, Take 25 mg by mouth daily., Disp: , Rfl:  .  warfarin (COUMADIN) 5 MG tablet, Take 5-7.5 mg by mouth See admin instructions. Take 1 and 1/2 tablets on Monday then take 1 tablet all the other days, Disp: , Rfl:    Allergies  Allergen Reactions  . No Known Allergies      Objective: Vascular Examination: Capillary refill time less than 3 seconds x 10 digits.  Dorsalis pedis pulses present b/l.  Posterior tibial pulses present b/l.  No digital hair x 10 digits.  Skin temperature gradient WNL b/l.  Dermatological Examination: Skin thin and atrophic b/l.  Toenails 1-5 b/l discolored, thick, dystrophic with subungual debris and pain with palpation to nailbeds due to thickness of nails.  Musculoskeletal: Muscle strength 5/5 to all LE muscle groups.  Neurological: Sensation intact with 10 gram monofilament.  Vibratory sensation intact.  Assessment: Painful onychomycosis toenails 1-5 b/l in patient on blood thinner.   Plan: 1. Toenails 1-5 b/l were debrided in length and girth without iatrogenic bleeding. 2. Patient to continue soft, supportive shoe gear daily. 3. Patient to report any pedal injuries to medical professional immediately. 4. Avoid self trimming due to use of  blood thinner. 5. Follow up 10 weeks 6. Patient/POA to call should there be a concern in the interim.

## 2018-05-07 ENCOUNTER — Telehealth: Payer: Self-pay | Admitting: Cardiovascular Disease

## 2018-05-07 NOTE — Telephone Encounter (Signed)
Agree. [name], PA-C    05/07/2018 4:03 PM

## 2018-05-07 NOTE — Telephone Encounter (Signed)
STAT if HR is under 50 or over 120 (normal HR is 60-100 beats per minute)  1) What is your heart rate? 55 now and it was 38 earlier  2) Do you have a log of your heart rate readings (document readings)? no  3) Do you have any other symptoms? When this happen, he has no energy*

## 2018-05-07 NOTE — Telephone Encounter (Signed)
Called pt who reported current HR of 54 and O2 of 96 Last BP this am was 129/57 Pt reports HR of 38 approximately 1 hr ago After discussing pts medication list pt reported that he had been taking 1.5 tab daily of Metoprolol. I educated pt on ordered dose of only 1 tab (25mg ) daily of Metoprolol. Pt verbalized understanding. Advised pt to call back tomorrow with an update or sooner if low HR persists again.

## 2018-05-08 ENCOUNTER — Encounter (HOSPITAL_COMMUNITY): Payer: Self-pay

## 2018-05-08 ENCOUNTER — Telehealth: Payer: Self-pay

## 2018-05-08 NOTE — Telephone Encounter (Signed)

## 2018-05-10 ENCOUNTER — Encounter (HOSPITAL_COMMUNITY): Payer: Self-pay

## 2018-05-10 ENCOUNTER — Ambulatory Visit (INDEPENDENT_AMBULATORY_CARE_PROVIDER_SITE_OTHER): Payer: Medicare Other | Admitting: *Deleted

## 2018-05-10 DIAGNOSIS — I4891 Unspecified atrial fibrillation: Secondary | ICD-10-CM

## 2018-05-10 DIAGNOSIS — Z5181 Encounter for therapeutic drug level monitoring: Secondary | ICD-10-CM

## 2018-05-10 LAB — POCT INR: INR: 2.8 (ref 2.0–3.0)

## 2018-05-13 ENCOUNTER — Encounter (HOSPITAL_COMMUNITY): Payer: Self-pay

## 2018-05-15 ENCOUNTER — Encounter (HOSPITAL_COMMUNITY): Payer: Self-pay

## 2018-05-17 ENCOUNTER — Encounter (HOSPITAL_COMMUNITY): Payer: Self-pay

## 2018-05-20 ENCOUNTER — Encounter (HOSPITAL_COMMUNITY): Payer: Self-pay

## 2018-05-22 ENCOUNTER — Encounter (HOSPITAL_COMMUNITY): Payer: Self-pay

## 2018-05-24 ENCOUNTER — Encounter (HOSPITAL_COMMUNITY): Payer: Self-pay

## 2018-05-24 ENCOUNTER — Telehealth (HOSPITAL_COMMUNITY): Payer: Self-pay | Admitting: *Deleted

## 2018-05-24 NOTE — Telephone Encounter (Signed)
Called to notify patient that the cardiac and pulmonary rehabilitation department remains closed at this time due to COVID-19 restrictions. Contacted patient at phone number provided but unable to leave message.  Sol Passer, MS, ACSM CEP 05/24/2018 1113

## 2018-05-27 ENCOUNTER — Encounter (HOSPITAL_COMMUNITY): Payer: Self-pay

## 2018-05-28 ENCOUNTER — Telehealth: Payer: Self-pay | Admitting: Pharmacist

## 2018-05-28 NOTE — Telephone Encounter (Signed)
Patient called stating he has a dental appointment tomorrow and they might pull some teeth (1-2) asking if he needs to hold coumadin. Patient advised that as long as they are only pulling 1-2 teeth he does not need to hold coumadin.  If dentist needs INR day of, give [place] a call.

## 2018-05-29 ENCOUNTER — Encounter (HOSPITAL_COMMUNITY): Payer: Self-pay

## 2018-05-31 ENCOUNTER — Encounter (HOSPITAL_COMMUNITY): Payer: Self-pay

## 2018-06-03 ENCOUNTER — Encounter (HOSPITAL_COMMUNITY): Payer: Self-pay

## 2018-06-05 ENCOUNTER — Encounter (HOSPITAL_COMMUNITY): Payer: Self-pay

## 2018-06-05 NOTE — Addendum Note (Signed)
Encounter addended by: Sueanne Margarita, MD on: 06/05/2018 5:55 PM  Actions taken: Clinical Note Signed

## 2018-06-05 NOTE — Addendum Note (Signed)
Encounter addended by: Sueanne Margarita, MD on: 06/05/2018 5:58 PM  Actions taken: Clinical Note Signed

## 2018-06-05 NOTE — Addendum Note (Signed)
Encounter addended by: Sueanne Margarita, MD on: 06/05/2018 5:53 PM  Actions taken: Clinical Note Signed

## 2018-06-05 NOTE — Addendum Note (Signed)
Encounter addended by: Sueanne Margarita, MD on: 06/05/2018 5:50 PM  Actions taken: Clinical Note Signed

## 2018-06-05 NOTE — Addendum Note (Signed)
Encounter addended by: Sueanne Margarita, MD on: 06/05/2018 5:52 PM  Actions taken: Clinical Note Signed

## 2018-06-05 NOTE — Addendum Note (Signed)
Encounter addended by: Sueanne Margarita, MD on: 06/05/2018 5:56 PM  Actions taken: Clinical Note Signed

## 2018-06-06 ENCOUNTER — Ambulatory Visit
Admission: RE | Admit: 2018-06-06 | Discharge: 2018-06-06 | Disposition: A | Discharge status: Home / Self Care | Payer: Medicare Other | Source: Ambulatory Visit | Attending: Internal Medicine | Admitting: Internal Medicine

## 2018-06-06 ENCOUNTER — Other Ambulatory Visit: Payer: Self-pay

## 2018-06-06 DIAGNOSIS — I509 Heart failure, unspecified: Secondary | ICD-10-CM

## 2018-06-06 DIAGNOSIS — R9389 Abnormal findings on diagnostic imaging of other specified body structures: Secondary | ICD-10-CM | POA: Diagnosis not present

## 2018-06-06 MED ORDER — IOPAMIDOL (ISOVUE-300) INJECTION 61%
75.0000 mL | Freq: Once | INTRAVENOUS | Status: AC | PRN
  Administered 2018-06-06: 75 mL via INTRAVENOUS

## 2018-06-07 ENCOUNTER — Encounter (HOSPITAL_COMMUNITY): Payer: Self-pay

## 2018-06-10 ENCOUNTER — Encounter (HOSPITAL_COMMUNITY): Payer: Self-pay

## 2018-06-10 DIAGNOSIS — I700 Atherosclerosis of aorta: Secondary | ICD-10-CM | POA: Insufficient documentation

## 2018-06-10 DIAGNOSIS — J449 Chronic obstructive pulmonary disease, unspecified: Secondary | ICD-10-CM | POA: Insufficient documentation

## 2018-06-12 ENCOUNTER — Other Ambulatory Visit: Payer: Self-pay | Admitting: Cardiovascular Disease

## 2018-06-12 ENCOUNTER — Encounter (HOSPITAL_COMMUNITY): Payer: Self-pay

## 2018-06-14 ENCOUNTER — Encounter (HOSPITAL_COMMUNITY): Payer: Self-pay

## 2018-06-17 ENCOUNTER — Encounter (HOSPITAL_COMMUNITY): Payer: Self-pay

## 2018-06-19 ENCOUNTER — Telehealth: Payer: Self-pay

## 2018-06-19 ENCOUNTER — Encounter (HOSPITAL_COMMUNITY): Payer: Self-pay

## 2018-06-19 NOTE — Telephone Encounter (Signed)

## 2018-06-20 DIAGNOSIS — H353231 Exudative age-related macular degeneration, bilateral, with active choroidal neovascularization: Secondary | ICD-10-CM | POA: Diagnosis not present

## 2018-06-20 DIAGNOSIS — H3554 Dystrophies primarily involving the retinal pigment epithelium: Secondary | ICD-10-CM | POA: Diagnosis not present

## 2018-06-20 DIAGNOSIS — H35362 Drusen (degenerative) of macula, left eye: Secondary | ICD-10-CM | POA: Diagnosis not present

## 2018-06-21 ENCOUNTER — Other Ambulatory Visit: Payer: Self-pay

## 2018-06-21 ENCOUNTER — Encounter (HOSPITAL_COMMUNITY): Payer: Self-pay

## 2018-06-21 ENCOUNTER — Ambulatory Visit (INDEPENDENT_AMBULATORY_CARE_PROVIDER_SITE_OTHER): Payer: Medicare Other | Admitting: *Deleted

## 2018-06-21 ENCOUNTER — Telehealth: Payer: Self-pay | Admitting: Cardiovascular Disease

## 2018-06-21 DIAGNOSIS — I4821 Permanent atrial fibrillation: Secondary | ICD-10-CM

## 2018-06-21 DIAGNOSIS — I4891 Unspecified atrial fibrillation: Secondary | ICD-10-CM

## 2018-06-21 DIAGNOSIS — Z5181 Encounter for therapeutic drug level monitoring: Secondary | ICD-10-CM

## 2018-06-21 LAB — POCT INR: INR: 3.2 — AB (ref 2.0–3.0)

## 2018-06-21 NOTE — Patient Instructions (Signed)
Spoke with pt and instructed him to take half a tab tonight and then continue taking 1 tablet every day except 1.5 tablets on Mondays.  Recheck INR in 4 weeks.  Call if placed on any new medications or if scheduled for any procedures.  Coumadin clinic [phone].

## 2018-06-21 NOTE — Telephone Encounter (Signed)
Patient set up for MyChart? DECLINED   Is patient using Smartphone/computer/tablet?NO  Did audio/video work?   Does patient need telephone visit? YES   Best phone number to use? [phone]  Special Instructions? Patient will have vitals ready      Virtual Visit Pre-Appointment Phone Call  "(Name), I am calling you today to discuss your upcoming appointment. We are currently trying to limit exposure to the virus that causes COVID-19 by seeing patients at home rather than in the office."  1. "What is the BEST phone number to call the day of the visit?" - include this in appointment notes  2. Do you have or have access to (through a family member/friend) a smartphone with video capability that we can use for your visit?" a. If yes - list this number in appt notes as cell (if different from BEST phone #) and list the appointment type as a VIDEO visit in appointment notes b. If no - list the appointment type as a PHONE visit in appointment notes  3. Confirm consent - "In the setting of the current Covid19 crisis, you are scheduled for a (phone or video) visit with your provider on (date) at (time).  Just as we do with many in-office visits, in order for you to participate in this visit, we must obtain consent.  If you'd like, I can send this to your mychart (if signed up) or email for you to review.  Otherwise, I can obtain your verbal consent now.  All virtual visits are billed to your insurance company just like a normal visit would be.  By agreeing to a virtual visit, we'd like you to understand that the technology does not allow for your provider to perform an examination, and thus may limit your provider's ability to fully assess your condition. If your provider identifies any concerns that need to be evaluated in person, we will make arrangements to do so.  Finally, though the technology is pretty good, we cannot assure that it will always work on either your or our end, and in the  setting of a video visit, we may have to convert it to a phone-only visit.  In either situation, we cannot ensure that we have a secure connection.  Are you willing to proceed?" STAFF: Did the patient verbally acknowledge consent to telehealth visit? Document YES/NO here: yes  4. Advise patient to be prepared - "Two hours prior to your appointment, go ahead and check your blood pressure, pulse, oxygen saturation, and your weight (if you have the equipment to check those) and write them all down. When your visit starts, your provider will ask you for this information. If you have an Apple Watch or Kardia device, please plan to have heart rate information ready on the day of your appointment. Please have a pen and paper handy nearby the day of the visit as well."  5. Give patient instructions for MyChart download to smartphone OR Doximity/Doxy.me as below if video visit (depending on what platform provider is using)  6. Inform patient they will receive a phone call 15 minutes prior to their appointment time (may be from unknown caller ID) so they should be prepared to answer    TELEPHONE CALL NOTE  Tyler Williams has been deemed a candidate for a follow-up tele-health visit to limit community exposure during the Covid-19 pandemic. I spoke with the patient via phone to ensure availability of phone/video source, confirm preferred email & phone number, and discuss instructions and expectations.  I reminded Tyler Williams to be prepared with any vital sign and/or heart rhythm information that could potentially be obtained via home monitoring, at the time of his visit. I reminded Tyler Williams to expect a phone call prior to his visit.  Tyler Williams 06/21/2018 2:41 PM   INSTRUCTIONS FOR DOWNLOADING THE MYCHART APP TO SMARTPHONE  - The patient must first make sure to have activated MyChart and know their login information - If Apple, go to [employer] and type in MyChart in the  search bar and download the app. If Android, ask patient to go to [employer] and type in [place] in the search bar and download the app. The app is free but as with any other app downloads, their phone may require them to verify saved payment information or Apple/Android password.  - The patient will need to then log into the app with their MyChart username and password, and select [facility] as their healthcare provider to link the account. When it is time for your visit, go to the MyChart app, find appointments, and click Begin Video Visit. Be sure to Select Allow for your device to access the Microphone and Camera for your visit. You will then be connected, and your provider will be with you shortly.  **If they have any issues connecting, or need assistance please contact MyChart service desk [phone])**  **If using a computer, in order to ensure the best quality for their visit they will need to use either of the following Internet Browsers: [employer], or Google Chrome**  IF USING DOXIMITY or DOXY.ME - The patient will receive a link just prior to their visit by text.     FULL LENGTH CONSENT FOR TELE-HEALTH VISIT   I hereby voluntarily request, consent and authorize [facility] and its employed or contracted physicians, physician assistants, nurse practitioners or other licensed health care professionals (the Practitioner), to provide me with telemedicine health care services (the Services") as deemed necessary by the treating Practitioner. I acknowledge and consent to receive the Services by the Practitioner via telemedicine. I understand that the telemedicine visit will involve communicating with the Practitioner through live audiovisual communication technology and the disclosure of certain medical information by electronic transmission. I acknowledge that I have been given the opportunity to request an in-person assessment or other available alternative prior  to the telemedicine visit and am voluntarily participating in the telemedicine visit.  I understand that I have the right to withhold or withdraw my consent to the use of telemedicine in the course of my care at any time, without affecting my right to future care or treatment, and that the Practitioner or I may terminate the telemedicine visit at any time. I understand that I have the right to inspect all information obtained and/or recorded in the course of the telemedicine visit and may receive copies of available information for a reasonable fee.  I understand that some of the potential risks of receiving the Services via telemedicine include:   Delay or interruption in medical evaluation due to technological equipment failure or disruption;  Information transmitted may not be sufficient (e.g. poor resolution of images) to allow for appropriate medical decision making by the Practitioner; and/or   In rare instances, security protocols could fail, causing a breach of personal health information.  Furthermore, I acknowledge that it is my responsibility to provide information about my medical history, conditions and care that is complete and accurate to the best of  my ability. I acknowledge that Practitioner's advice, recommendations, and/or decision may be based on factors not within their control, such as incomplete or inaccurate data provided by me or distortions of diagnostic images or specimens that may result from electronic transmissions. I understand that the practice of medicine is not an exact science and that Practitioner makes no warranties or guarantees regarding treatment outcomes. I acknowledge that I will receive a copy of this consent concurrently upon execution via email to the email address I last provided but may also request a printed copy by calling the office of [facility].    I understand that my insurance will be billed for this visit.   I have read or had this consent read  to me.  I understand the contents of this consent, which adequately explains the benefits and risks of the Services being provided via telemedicine.   I have been provided ample opportunity to ask questions regarding this consent and the Services and have had my questions answered to my satisfaction.  I give my informed consent for the services to be provided through the use of telemedicine in my medical care  By participating in this telemedicine visit I agree to the above.

## 2018-06-24 ENCOUNTER — Encounter (HOSPITAL_COMMUNITY): Payer: Self-pay

## 2018-06-25 ENCOUNTER — Telehealth (INDEPENDENT_AMBULATORY_CARE_PROVIDER_SITE_OTHER): Payer: Medicare Other | Admitting: Cardiovascular Disease

## 2018-06-25 ENCOUNTER — Other Ambulatory Visit: Payer: Self-pay

## 2018-06-25 ENCOUNTER — Encounter: Payer: Self-pay | Admitting: Cardiovascular Disease

## 2018-06-25 VITALS — BP 131/58 | HR 54 | Temp 97.7°F | Ht 68.0 in | Wt 175.0 lb

## 2018-06-25 DIAGNOSIS — I4811 Longstanding persistent atrial fibrillation: Secondary | ICD-10-CM | POA: Diagnosis not present

## 2018-06-25 DIAGNOSIS — I5032 Chronic diastolic (congestive) heart failure: Secondary | ICD-10-CM

## 2018-06-25 DIAGNOSIS — I359 Nonrheumatic aortic valve disorder, unspecified: Secondary | ICD-10-CM

## 2018-06-25 NOTE — Patient Instructions (Signed)
Medication Instructions:  Your provider recommends that you continue on your current medications as directed. Please refer to the Current Medication list given to you today.    Follow-Up: Your provider wants you to follow-up in: 6 months with Dr. Burt Knack. You will receive a reminder letter in the mail two months in advance. If you don't receive a letter, please call our office to schedule the follow-up appointment.

## 2018-06-25 NOTE — Progress Notes (Signed)
Virtual Visit via Telephone Note   This visit type was conducted due to national recommendations for restrictions regarding the COVID-19 Pandemic (e.g. social distancing) in an effort to limit this patient's exposure and mitigate transmission in our community.  Due to his co-morbid illnesses, this patient is at least at moderate risk for complications without adequate follow up.  This format is felt to be most appropriate for this patient at this time.  The patient did not have access to video technology/had technical difficulties with video requiring transitioning to audio format only (telephone).  All issues noted in this document were discussed and addressed.  No physical exam could be performed with this format.  Please refer to the patient's chart for his  consent to telehealth for [facility].   Date:  06/25/2018   ID:  [name], DOB 30-Apr-1926, MRN [id number]  Patient Location: Home Provider Location: Home  PCP:  Burnard Bunting, MD  Cardiologist:  Sherren Mocha, MD  Electrophysiologist:  None   Evaluation Performed:  Follow-Up Visit  Chief Complaint:  FU CAD/aortic valve disease/AFib  History of Present Illness:    Tyler Williams is a 85 y.o. male with hx of atrial fibrillation, coronary artery disease status post angioplasty in the 1980s and 1990s, peripheral vascular disease, AAA status post repair, aortic stenosis status post TAVR in 8546, diastolic heart failure, long-term anticoagulation with warfarin.  The patient does not have symptoms concerning for COVID-19 infection (fever, chills, cough, or new shortness of breath).   He is doing well today. He was really enjoying cardiac rehab and was starting the maintenance phase when the Covid 19 restrictions cancelled all cardiac rehab. He has tried to stay active with walking around the block at his home. Today, he denies symptoms of palpitations, chest pain, shortness of breath, orthopnea, PND, lower  extremity edema, dizziness, or syncope. He was having problems with leg swelling when I last saw him, but this is greatly improved and he has no swelling at present. Dr Reynaldo Minium prescribed spironolactone and this helped significantly.   Past Medical History:  Diagnosis Date  . A-fib ([place])   . AAA (abdominal aortic aneurysm) ([place])    s/p repair  . Aortic stenosis, mild    a. mean gradient 17 mmHg on 04/2012 TTE  . Arthritis    knees  . CAD (coronary artery disease)    a. s/p multiple percutaneous prior coronary artery interventions b. 05/2012: unsuccessful PCI to tight RCA->medically managed  . Cancer [facility])    skin cancer removed, right hand, left leg, chest  . Carotid artery disease ([place])    0-39% bilateral ICA stenoses 11/2011  . Chronic anticoagulation    Coumadin  . Coronary artery disease   . GERD (gastroesophageal reflux disease)   . GERD (gastroesophageal reflux disease)   . History of pulmonary embolism    1964 related to dislocated hip on right   . History of skin cancer   . Hypercholesteremia   . Hypertension   . Incidental cecal mass noted on CT imaging 05/24/2016   **An incidental finding of potential clinical significance has been found. 4.6 x 5.6 x 3.7 cm masslike lesion in the cecum adjacent to the ileocecal valve may represent a large polyp or colonic neoplasm. Correlation with nonemergent colonoscopy is strongly recommended in the near future to better evaluate this finding.**  . Macular degeneration    eye injections  . Microscopic hematuria    Followed by urology  . Permanent atrial  fibrillation   . PVD (peripheral vascular disease) ([place])    Bilateral renal artery atherosclerosis, SMA stenosis with collateralization  . RBBB    Chronic  . 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 SSS (sick sinus syndrome) [facility])    Past Surgical History:   Procedure Laterality Date  . ABDOMINAL AORTIC ANEURYSM REPAIR     1993   . CARDIAC CATHETERIZATION  05/24/2012   pD1 20%, oCFX 20%, mCFX 30%, ostial Int Br 30%, distal AV groove CFX 30%, pRCA 30-40%, mRCA 99%, dRCA 30%, PL branch small with diffuse 40%. Unable to pass wire past RCA lesion->medically managed  . CHOLECYSTECTOMY N/A 12/05/2012   Procedure: LAPAROSCOPIC CHOLECYSTECTOMY ;  Surgeon: Edward Jolly, MD;  Location: [place];  Service: General;  Laterality: N/A;  . CHOLECYSTECTOMY    . CORONARY ANGIOPLASTY  1980, 1990  . CORONARY BALLOON ANGIOPLASTY N/A 08/31/2017   Procedure: CORONARY BALLOON ANGIOPLASTY;  Surgeon: Martinique, Peter M, MD;  Location: [place] CV LAB;  Service: Cardiovascular;  Laterality: N/A;  . EYE SURGERY     hx of cataract surgery   . [place]  . HERNIA REPAIR     right inguinal hernia repair   . JOINT REPLACEMENT     left knee replacement, partial right  . LEFT HEART CATH AND CORONARY ANGIOGRAPHY N/A 08/31/2017   Procedure: LEFT HEART CATH AND CORONARY ANGIOGRAPHY;  Surgeon: Martinique, Peter M, MD;  Location: [place] CV LAB;  Service: Cardiovascular;  Laterality: N/A;  . LEFT HEART CATHETERIZATION WITH CORONARY ANGIOGRAM N/A 05/24/2012   Procedure: LEFT HEART CATHETERIZATION WITH CORONARY ANGIOGRAM;  Surgeon: Burnell Blanks, MD;  Location: [facility] CATH LAB;  Service: Cardiovascular;  Laterality: N/A;  . ORTHOPEDIC SURGERY    . OTHER SURGICAL HISTORY     right knee popliteal aneurysm surgery stent placed   . OTHER SURGICAL HISTORY    . PARTIAL KNEE ARTHROPLASTY  01/22/2012   Procedure: UNICOMPARTMENTAL KNEE;  Surgeon: Mauri Pole, MD;  Location: [facility] OR[facility];  Service: Orthopedics;  Laterality: Right;  . PERCUTANEOUS CORONARY INTERVENTION-BALLOON ONLY  05/24/2012   Procedure: PERCUTANEOUS CORONARY INTERVENTION-BALLOON ONLY;  Surgeon: Burnell Blanks, MD;  Location: [facility] C[facility] LAB;  Service: Cardiovascular;;  . RIGHT/LEFT HEART CATH AND  CORONARY ANGIOGRAPHY N/A 05/12/2016   Procedure: Right/Left Heart Cath and Coronary Angiography;  Surgeon: Sherren Mocha, MD;  Location: [place] CV LAB;  Service: Cardiovascular;  Laterality: N/A;  . TEE WITHOUT CARDIOVERSION N/A 06/13/2016   Procedure: TRANSESOPHAGEAL ECHOCARDIOGRAM (TEE);  Surgeon: Sherren Mocha, MD;  Location: [place];  Service: Open Heart Surgery;  Laterality: N/A;  . TONSILLECTOMY    . TOTAL KNEE ARTHROPLASTY Left   . TRANSCATHETER AORTIC VALVE REPLACEMENT, TRANSFEMORAL N/A 06/13/2016   Procedure: TRANSCATHETER AORTIC VALVE REPLACEMENT, TRANSFEMORAL;  Surgeon: Sherren Mocha, MD;  Location: [place];  Service: Open Heart Surgery;  Laterality: N/A;     Current Meds  Medication Sig  . amLODipine (NORVASC) 10 MG tablet Take 10 mg by mouth daily.  Marland Kitchen aspirin EC 81 MG tablet Take 81 mg by mouth daily.  Marland Kitchen atorvastatin (LIPITOR) 20 MG tablet TAKE 1 TABLET ONCE DAILY.  . Calcium-Magnesium-Zinc (CAL-MAG-ZINC PO) Take 1 tablet by mouth daily.  . Cholecalciferol (VITAMIN D3) 5000 units TABS Take 1 tablet by mouth daily.  . Coenzyme Q10 (COQ-10) 10 MG CAPS Take 10 mg by  mouth daily.  . fesoterodine (TOVIAZ) 8 MG TB24 tablet Take 8 mg by mouth every evening.  . furosemide (LASIX) 40 MG tablet TAKE 1 TABLET EACH DAY.  . isosorbide mononitrate (IMDUR) 60 MG 24 hr tablet Take 1.5 tablets (90 mg total) by mouth daily.  Marland Kitchen LORazepam (ATIVAN) 1 MG tablet Take 1 mg by mouth at bedtime.  . metoprolol succinate (TOPROL-XL) 25 MG 24 hr tablet Take 1 tablet (25 mg total) by mouth daily.  . nitroGLYCERIN (NITROSTAT) 0.4 MG SL tablet Place 0.4 mg under the tongue every 5 (five) minutes as needed for chest pain.  . ramipril (ALTACE) 10 MG capsule Take 10 mg by mouth 2 (two) times daily.  . ranibizumab (LUCENTIS) 0.5 MG/0.05ML SOLN 0.5 mg by Intravitreal route See admin instructions. Every 9 weeks  . spironolactone (ALDACTONE) 25 MG tablet Take 25 mg by mouth daily.  Marland Kitchen warfarin (COUMADIN) 5 MG  tablet Take 5-7.5 mg by mouth See admin instructions. Take 1 and 1/2 tablets on Monday then take 1 tablet all the other days     Allergies:   No known allergies   Social History   Tobacco Use  . Smoking status: Never Smoker  . Smokeless tobacco: Never Used  Substance Use Topics  . Alcohol use: Not Currently    Frequency: Never    Comment: patient doesn't drink anymore  . Drug use: Never     Family Hx: The patient's family history includes Heart attack (age of onset: 25) in his mother.  ROS:   Please see the history of present illness.    All other systems reviewed and are negative.  Prior CV studies:   The following studies were reviewed today:  CT Chest with Contrast: IMPRESSION: Atherosclerosis of thoracic aorta without aneurysm or dissection. Status post transcatheter aortic valve replacement. Left atrial enlargement is noted.  Coronary artery calcifications are noted.  Probable right basilar scarring is noted. No acute pulmonary abnormality is noted.  Probable hepatic cirrhosis is noted in visualized portion of upper abdomen.  Aortic Atherosclerosis (ICD10-I70.0) and Emphysema (ICD10-J43.9).  Echo 08-28-2017: Study Conclusions  - Left ventricle: The cavity size was normal. Wall thickness was   increased in a pattern of mild LVH. Systolic function was normal.   The estimated ejection fraction was in the range of 55% to 60%.   There is akinesis of the basalinferolateral myocardium. - Aortic valve: A bioprosthesis was present. There was trivial   regurgitation. - Mitral valve: Calcified annulus. There was mild regurgitation. - Left atrium: The atrium was severely dilated. - Right atrium: The atrium was moderately dilated. - Pulmonary arteries: Systolic pressure was mildly increased. PA   peak pressure: 43 mm Hg (S).  Impressions:  - Akinesis of the inferolateral wall with overall preserved LV   systolic function; s/p AVR with trace AI; mild MR;  biatrial   enlargement; trace TR with mild pulmonary hypertension.  Labs/Other Tests and Data Reviewed:    EKG:  An ECG dated 09/25/2017 was personally reviewed today and demonstrated:  atrial flutter with 4:1 conduction heart rate 57 bpm  Recent Labs: 09/01/2017: BUN 14; Creatinine, Ser 0.92; Hemoglobin 15.0; Platelets 121; Potassium 3.8; Sodium 139   Recent Lipid Panel Lab Results  Component Value Date/Time   CHOL 149 08/28/2017 03:50 AM   TRIG 95 08/28/2017 03:50 AM   HDL 52 08/28/2017 03:50 AM   CHOLHDL 2.9 08/28/2017 03:50 AM   LDLCALC 78 08/28/2017 03:50 AM    Wt Readings from Last  3 Encounters:  06/25/18 175 lb (79.4 kg)  01/02/18 195 lb 12.8 oz (88.8 kg)  12/13/17 195 lb 15.8 oz (88.9 kg)     Objective:    Vital Signs:  BP (!) 131/58   Pulse (!) 54   Temp 97.7 F (36.5 C)   Ht 5\' 8"  (1.727 m)   Wt 175 lb (79.4 kg)   BMI 26.61 kg/m    VITAL SIGNS:  reviewed Remaining exam is not performed today because this visit is done as a virtual visit via telephone  ASSESSMENT & PLAN:    1. Permanent atrial fibrillation: Patient continues on warfarin for anticoagulation.  He has had no bleeding problems.  Heart rate has been well controlled over time. 2. Chronic diastolic heart failure: Patient with New York Heart Association functional class II symptoms.  Really seems to be doing quite well right now.  I think the addition of Spironolactone has helped significantly as well.  He has lost 20 pounds over the last 6 months.  Labs are followed by Dr. Reynaldo Minium.  3.  Aortic valve disease status post TAVR: His aortic bioprosthesis has functioned normally based on postoperative echo studies 4. Coronary artery disease, native vessel, with angina: The patient symptoms have been well controlled on aspirin, a statin drug, isosorbide, and a beta-blocker.  COVID-19 Education: The signs and symptoms of COVID-19 were discussed with the patient and how to seek care for testing (follow up with  PCP or arrange E-visit).  The importance of social distancing was discussed today.  Time:   Today, I have spent 25 minutes with the patient with telehealth technology discussing the above problems.    Medication Adjustments/Labs and Tests Ordered: Current medicines are reviewed at length with the patient today.  Concerns regarding medicines are outlined above.   Tests Ordered: No orders of the defined types were placed in this encounter.   Medication Changes: No orders of the defined types were placed in this encounter.   Disposition:  Follow up in 6 month(s)  Signed, Sherren Mocha, MD  06/25/2018 9:03 AM    [place]

## 2018-06-26 ENCOUNTER — Encounter (HOSPITAL_COMMUNITY): Payer: Self-pay

## 2018-06-28 ENCOUNTER — Encounter (HOSPITAL_COMMUNITY): Payer: Self-pay

## 2018-07-01 ENCOUNTER — Encounter (HOSPITAL_COMMUNITY): Payer: Self-pay

## 2018-07-03 ENCOUNTER — Encounter (HOSPITAL_COMMUNITY): Payer: Self-pay

## 2018-07-04 ENCOUNTER — Ambulatory Visit: Payer: Medicare Other | Admitting: Podiatry

## 2018-07-05 ENCOUNTER — Encounter (HOSPITAL_COMMUNITY): Payer: Self-pay

## 2018-07-10 ENCOUNTER — Encounter (HOSPITAL_COMMUNITY): Payer: Self-pay

## 2018-07-12 ENCOUNTER — Encounter (HOSPITAL_COMMUNITY): Payer: Self-pay

## 2018-07-12 ENCOUNTER — Ambulatory Visit: Payer: Medicare Other | Admitting: Cardiovascular Disease

## 2018-07-15 ENCOUNTER — Encounter (HOSPITAL_COMMUNITY): Payer: Self-pay

## 2018-07-16 ENCOUNTER — Telehealth: Payer: Self-pay

## 2018-07-16 NOTE — Telephone Encounter (Signed)
lmom for move appt and prescreen

## 2018-07-16 NOTE — Telephone Encounter (Signed)
1. COVID-19 Pre-Screening Questions:  . In the past 7 to 10 days have you had a cough,  shortness of breath, headache, congestion, fever (100 or greater) body aches, chills, sore throat, or sudden loss of taste or sense of smell?  No . Have you been around anyone with known Covid 19.  No . Have you been around anyone who is awaiting Covid 19 test results in the past 7 to 10 days?  No . Have you been around anyone who has been exposed to Covid 19, or has mentioned symptoms of Covid 19 within the past 7 to 10 days?  No    2. Pt advised of visitor restrictions (no visitors allowed except if needed to conduct the visit). Also advised to arrive at appointment time and wear a mask.   Moved pt from 11:15am where he was double booked to 2pm.

## 2018-07-17 ENCOUNTER — Encounter (HOSPITAL_COMMUNITY): Payer: Self-pay

## 2018-07-19 ENCOUNTER — Ambulatory Visit (INDEPENDENT_AMBULATORY_CARE_PROVIDER_SITE_OTHER): Payer: Medicare Other | Admitting: *Deleted

## 2018-07-19 ENCOUNTER — Other Ambulatory Visit: Payer: Self-pay

## 2018-07-19 ENCOUNTER — Encounter (HOSPITAL_COMMUNITY): Payer: Self-pay

## 2018-07-19 DIAGNOSIS — I4821 Permanent atrial fibrillation: Secondary | ICD-10-CM

## 2018-07-19 DIAGNOSIS — Z5181 Encounter for therapeutic drug level monitoring: Secondary | ICD-10-CM | POA: Diagnosis not present

## 2018-07-19 DIAGNOSIS — I4891 Unspecified atrial fibrillation: Secondary | ICD-10-CM | POA: Diagnosis not present

## 2018-07-19 LAB — POCT INR: INR: 2.7 (ref 2.0–3.0)

## 2018-07-19 NOTE — Patient Instructions (Signed)
Description    Continue taking 1 tablet every day except 1.5 tablets on Mondays.  Recheck INR in 5 weeks.  Call if placed on any new medications or if scheduled for any procedures.  Coumadin clinic [phone].

## 2018-07-22 ENCOUNTER — Encounter: Payer: Self-pay | Admitting: Podiatry

## 2018-07-22 ENCOUNTER — Encounter (HOSPITAL_COMMUNITY): Payer: Self-pay

## 2018-07-22 ENCOUNTER — Other Ambulatory Visit: Payer: Self-pay

## 2018-07-22 ENCOUNTER — Ambulatory Visit (INDEPENDENT_AMBULATORY_CARE_PROVIDER_SITE_OTHER): Payer: Medicare Other | Admitting: Podiatry

## 2018-07-22 VITALS — Temp 97.9°F

## 2018-07-22 DIAGNOSIS — M79674 Pain in right toe(s): Secondary | ICD-10-CM | POA: Diagnosis not present

## 2018-07-22 DIAGNOSIS — Z9229 Personal history of other drug therapy: Secondary | ICD-10-CM | POA: Diagnosis not present

## 2018-07-22 DIAGNOSIS — M79675 Pain in left toe(s): Secondary | ICD-10-CM | POA: Diagnosis not present

## 2018-07-22 DIAGNOSIS — B351 Tinea unguium: Secondary | ICD-10-CM

## 2018-07-22 NOTE — Patient Instructions (Signed)

## 2018-07-24 ENCOUNTER — Encounter (HOSPITAL_COMMUNITY): Payer: Self-pay

## 2018-07-25 DIAGNOSIS — H353231 Exudative age-related macular degeneration, bilateral, with active choroidal neovascularization: Secondary | ICD-10-CM | POA: Diagnosis not present

## 2018-07-25 NOTE — Progress Notes (Signed)
Subjective: Tyler Williams is a 85 y.o. y.o. male who is on long term blood thinner Coumadin and presents for debridement of painful, discolored, thick toenails which interfere with daily activities. Pain is aggravated when wearing enclosed shoe gear. Pain is relieved with periodic professional debridement.  Burnard Bunting, MD is his PCP and last visit was January or February per patient recall.  He voices no new pedal concerns on today's visit.   Current Outpatient Medications:  .  amLODipine (NORVASC) 10 MG tablet, Take 10 mg by mouth daily., Disp: , Rfl:  .  aspirin EC 81 MG tablet, Take 81 mg by mouth daily., Disp: , Rfl:  .  atorvastatin (LIPITOR) 20 MG tablet, TAKE 1 TABLET ONCE DAILY., Disp: 90 tablet, Rfl: 3 .  Calcium-Magnesium-Zinc (CAL-MAG-ZINC PO), Take 1 tablet by mouth daily., Disp: , Rfl:  .  Cholecalciferol (VITAMIN D3) 5000 units TABS, Take 1 tablet by mouth daily., Disp: , Rfl:  .  Coenzyme Q10 (COQ-10) 10 MG CAPS, Take 10 mg by mouth daily., Disp: , Rfl:  .  fesoterodine (TOVIAZ) 8 MG TB24 tablet, Take 8 mg by mouth every evening., Disp: , Rfl:  .  furosemide (LASIX) 40 MG tablet, TAKE 1 TABLET EACH DAY., Disp: 90 tablet, Rfl: 0 .  isosorbide mononitrate (IMDUR) 60 MG 24 hr tablet, Take 1.5 tablets (90 mg total) by mouth daily., Disp: 135 tablet, Rfl: 2 .  LORazepam (ATIVAN) 1 MG tablet, Take 1 mg by mouth at bedtime., Disp: , Rfl:  .  metoprolol succinate (TOPROL-XL) 25 MG 24 hr tablet, Take 1 tablet (25 mg total) by mouth daily., Disp: 90 tablet, Rfl: 3 .  nitroGLYCERIN (NITROSTAT) 0.4 MG SL tablet, Place 0.4 mg under the tongue every 5 (five) minutes as needed for chest pain., Disp: , Rfl:  .  ramipril (ALTACE) 10 MG capsule, Take 10 mg by mouth 2 (two) times daily., Disp: , Rfl:  .  ranibizumab (LUCENTIS) 0.5 MG/0.05ML SOLN, 0.5 mg by Intravitreal route See admin instructions. Every 9 weeks, Disp: , Rfl:  .  spironolactone (ALDACTONE) 25 MG tablet, Take 25 mg by  mouth daily., Disp: , Rfl:  .  warfarin (COUMADIN) 5 MG tablet, Take 5-7.5 mg by mouth See admin instructions. Take 1 and 1/2 tablets on Monday then take 1 tablet all the other days, Disp: , Rfl:    Allergies  Allergen Reactions  . No Known Allergies      Objective: Vitals:   07/22/18 1042  Temp: 97.9 F (36.6 C)    Vascular Examination: Capillary refill time less than 3 seconds x 10 digits.  Dorsalis pedis pulses palpable b/l.  Posterior tibial pulses palpable b/l.  No digital hair x 10 digits.  Skin temperature gradient WNL b/l.  Dermatological Examination: Skin thin, shiny and atrophic b/l.  Toenails 1-5 b/l discolored, thick, dystrophic with subungual debris and pain with palpation to nailbeds due to thickness of nails.  Musculoskeletal: Muscle strength 5/5 to all LE muscle groups.  Neurological: Sensation intact with 10 gram monofilament.  Vibratory sensation intact.  Assessment: Painful onychomycosis toenails 1-5 b/l in patient on blood thinner.   Plan: 1. Toenails 1-5 b/l were debrided in length and girth without iatrogenic bleeding. 2. Patient to continue soft, supportive shoe gear daily. 3. Patient to report any pedal injuries to medical professional immediately. 4. Avoid self trimming due to use of blood thinner. 5. Follow up 10 weeks. 6. Patient/POA to call should there be a concern in the interim.

## 2018-07-26 ENCOUNTER — Encounter (HOSPITAL_COMMUNITY): Payer: Self-pay

## 2018-07-29 ENCOUNTER — Encounter (HOSPITAL_COMMUNITY): Payer: Self-pay

## 2018-07-31 ENCOUNTER — Encounter (HOSPITAL_COMMUNITY): Payer: Self-pay

## 2018-08-02 ENCOUNTER — Encounter (HOSPITAL_COMMUNITY): Payer: Self-pay

## 2018-08-05 ENCOUNTER — Encounter (HOSPITAL_COMMUNITY): Admission: RE | Admit: 2018-08-05 | Payer: Self-pay | Source: Ambulatory Visit

## 2018-08-07 ENCOUNTER — Encounter (HOSPITAL_COMMUNITY): Payer: Self-pay

## 2018-08-09 ENCOUNTER — Encounter (HOSPITAL_COMMUNITY): Payer: Self-pay

## 2018-08-12 ENCOUNTER — Encounter (HOSPITAL_COMMUNITY): Payer: Self-pay

## 2018-08-19 ENCOUNTER — Telehealth: Payer: Self-pay

## 2018-08-19 NOTE — Telephone Encounter (Signed)
Unable to lmom for prescreen  

## 2018-08-23 ENCOUNTER — Encounter (INDEPENDENT_AMBULATORY_CARE_PROVIDER_SITE_OTHER): Payer: Self-pay

## 2018-08-23 ENCOUNTER — Telehealth: Payer: Self-pay | Admitting: *Deleted

## 2018-08-23 ENCOUNTER — Ambulatory Visit (INDEPENDENT_AMBULATORY_CARE_PROVIDER_SITE_OTHER): Payer: Medicare Other | Admitting: *Deleted

## 2018-08-23 ENCOUNTER — Other Ambulatory Visit: Payer: Self-pay

## 2018-08-23 DIAGNOSIS — Z5181 Encounter for therapeutic drug level monitoring: Secondary | ICD-10-CM

## 2018-08-23 DIAGNOSIS — I4891 Unspecified atrial fibrillation: Secondary | ICD-10-CM | POA: Diagnosis not present

## 2018-08-23 LAB — POCT INR: INR: 3.5 — AB (ref 2.0–3.0)

## 2018-08-23 NOTE — Patient Instructions (Signed)
Description    Hold today, then continue taking 1 tablet every day except 1.5 tablets on Mondays.  Recheck INR in 3 weeks.  Call if placed on any new medications or if scheduled for any procedures.  Coumadin clinic [phone].

## 2018-08-23 NOTE — Telephone Encounter (Signed)

## 2018-08-29 DIAGNOSIS — H353231 Exudative age-related macular degeneration, bilateral, with active choroidal neovascularization: Secondary | ICD-10-CM | POA: Diagnosis not present

## 2018-09-11 ENCOUNTER — Telehealth: Payer: Self-pay | Admitting: Pharmacist

## 2018-09-11 DIAGNOSIS — I10 Essential (primary) hypertension: Secondary | ICD-10-CM | POA: Diagnosis not present

## 2018-09-11 DIAGNOSIS — Z125 Encounter for screening for malignant neoplasm of prostate: Secondary | ICD-10-CM | POA: Diagnosis not present

## 2018-09-11 DIAGNOSIS — E7849 Other hyperlipidemia: Secondary | ICD-10-CM | POA: Diagnosis not present

## 2018-09-11 NOTE — Telephone Encounter (Signed)

## 2018-09-12 ENCOUNTER — Other Ambulatory Visit: Payer: Self-pay | Admitting: Cardiovascular Disease

## 2018-09-16 ENCOUNTER — Other Ambulatory Visit: Payer: Self-pay

## 2018-09-16 ENCOUNTER — Ambulatory Visit (INDEPENDENT_AMBULATORY_CARE_PROVIDER_SITE_OTHER): Payer: Medicare Other | Admitting: *Deleted

## 2018-09-16 DIAGNOSIS — Z5181 Encounter for therapeutic drug level monitoring: Secondary | ICD-10-CM

## 2018-09-16 DIAGNOSIS — I4821 Permanent atrial fibrillation: Secondary | ICD-10-CM | POA: Diagnosis not present

## 2018-09-16 LAB — POCT INR: INR: 2.1 (ref 2.0–3.0)

## 2018-09-16 NOTE — Patient Instructions (Signed)
Description   Continue taking 1 tablet every day except 1.5 tablets on Mondays.  Recheck INR in 4 weeks.  Call if placed on any new medications or if scheduled for any procedures.  Coumadin clinic [phone].

## 2018-09-24 DIAGNOSIS — I10 Essential (primary) hypertension: Secondary | ICD-10-CM | POA: Diagnosis not present

## 2018-09-24 DIAGNOSIS — R82998 Other abnormal findings in urine: Secondary | ICD-10-CM | POA: Diagnosis not present

## 2018-09-30 DIAGNOSIS — E7849 Other hyperlipidemia: Secondary | ICD-10-CM | POA: Diagnosis not present

## 2018-10-03 DIAGNOSIS — I509 Heart failure, unspecified: Secondary | ICD-10-CM | POA: Diagnosis not present

## 2018-10-03 DIAGNOSIS — Z952 Presence of prosthetic heart valve: Secondary | ICD-10-CM | POA: Diagnosis not present

## 2018-10-03 DIAGNOSIS — E785 Hyperlipidemia, unspecified: Secondary | ICD-10-CM | POA: Diagnosis not present

## 2018-10-03 DIAGNOSIS — I130 Hypertensive heart and chronic kidney disease with heart failure and stage 1 through stage 4 chronic kidney disease, or unspecified chronic kidney disease: Secondary | ICD-10-CM | POA: Diagnosis not present

## 2018-10-03 DIAGNOSIS — I482 Chronic atrial fibrillation, unspecified: Secondary | ICD-10-CM | POA: Diagnosis not present

## 2018-10-03 DIAGNOSIS — I739 Peripheral vascular disease, unspecified: Secondary | ICD-10-CM | POA: Diagnosis not present

## 2018-10-03 DIAGNOSIS — J449 Chronic obstructive pulmonary disease, unspecified: Secondary | ICD-10-CM | POA: Diagnosis not present

## 2018-10-03 DIAGNOSIS — M1990 Unspecified osteoarthritis, unspecified site: Secondary | ICD-10-CM | POA: Diagnosis not present

## 2018-10-03 DIAGNOSIS — Z0000 Encounter for general adult medical examination without abnormal findings: Secondary | ICD-10-CM | POA: Diagnosis not present

## 2018-10-03 DIAGNOSIS — I2510 Atherosclerotic heart disease of native coronary artery without angina pectoris: Secondary | ICD-10-CM | POA: Diagnosis not present

## 2018-10-03 DIAGNOSIS — N182 Chronic kidney disease, stage 2 (mild): Secondary | ICD-10-CM | POA: Diagnosis not present

## 2018-10-03 DIAGNOSIS — I700 Atherosclerosis of aorta: Secondary | ICD-10-CM | POA: Diagnosis not present

## 2018-10-17 DIAGNOSIS — H353231 Exudative age-related macular degeneration, bilateral, with active choroidal neovascularization: Secondary | ICD-10-CM | POA: Diagnosis not present

## 2018-10-18 ENCOUNTER — Other Ambulatory Visit: Payer: Self-pay

## 2018-10-18 ENCOUNTER — Ambulatory Visit (INDEPENDENT_AMBULATORY_CARE_PROVIDER_SITE_OTHER): Payer: Medicare Other | Admitting: *Deleted

## 2018-10-18 DIAGNOSIS — Z5181 Encounter for therapeutic drug level monitoring: Secondary | ICD-10-CM | POA: Diagnosis not present

## 2018-10-18 DIAGNOSIS — I4891 Unspecified atrial fibrillation: Secondary | ICD-10-CM

## 2018-10-18 LAB — POCT INR: INR: 3.1 — AB (ref 2.0–3.0)

## 2018-10-18 NOTE — Patient Instructions (Signed)
Description   Take 1/2 a tablet today, then continue taking 1 tablet every day except 1.5 tablets on Mondays.  Recheck INR in 4 weeks.  Call if placed on any new medications or if scheduled for any procedures.  Coumadin clinic [phone].

## 2018-10-24 DIAGNOSIS — Z23 Encounter for immunization: Secondary | ICD-10-CM | POA: Diagnosis not present

## 2018-10-25 ENCOUNTER — Telehealth: Payer: Self-pay | Admitting: Cardiovascular Disease

## 2018-10-25 NOTE — Telephone Encounter (Signed)
New message     Talk to the nurse regarding his appt with Dr Burt Knack and swelling in his legs.  (That is all he would tell me)

## 2018-10-25 NOTE — Telephone Encounter (Signed)
I spoke with pt. He reports swelling has been controlled on Spironolactone but this was stopped by Dr. Reynaldo Minium about 5 weeks ago. Pt reports he requested it be stopped due to frequent urination. Pt states since this was stopped he has gradually developed swelling in feet and ankles. Weight has remained stable at 180-181 lbs.   Pt is also asking about follow up with Dr. Burt Knack. I told him this was planned for November and I would have Katy contact him to schedule this appointment. Will send note to Dr. Burt Knack about swelling.

## 2018-10-28 ENCOUNTER — Ambulatory Visit (INDEPENDENT_AMBULATORY_CARE_PROVIDER_SITE_OTHER): Payer: Medicare Other | Admitting: Podiatry

## 2018-10-28 ENCOUNTER — Other Ambulatory Visit: Payer: Self-pay

## 2018-10-28 ENCOUNTER — Encounter: Payer: Self-pay | Admitting: Podiatry

## 2018-10-28 DIAGNOSIS — B351 Tinea unguium: Secondary | ICD-10-CM

## 2018-10-28 DIAGNOSIS — M79674 Pain in right toe(s): Secondary | ICD-10-CM | POA: Diagnosis not present

## 2018-10-28 DIAGNOSIS — M79675 Pain in left toe(s): Secondary | ICD-10-CM

## 2018-10-28 NOTE — Patient Instructions (Signed)

## 2018-10-28 NOTE — Telephone Encounter (Signed)
The patient has no complaints other than increased swelling. He was taking aldactone 25 mg BID (Epic med list was incorrect). The patient would like to try 25 mg qd to see if that helps.  Scheduled the patient for evaluation with Dr. Burt Knack 10/5 to see if symptoms have improved.  He will call prior to that time if swelling worsens, weight increases more than 3 pounds in a day, or more symptoms occur. He was grateful for call and agrees with treatment plan.

## 2018-10-28 NOTE — Progress Notes (Signed)
Subjective: Tyler Williams is seen today for follow up painful, elongated, thickened toenails 1-5 b/l feet that he cannot cut. Pain interferes with daily activities. Aggravating factor includes wearing enclosed shoe gear and relieved with periodic debridement.  Objective:  Neurovascular status unchanged: CFT <3 seconds x 10 digits.  DP/PT pulses palpable b/l.  No digital hair noted b/l.   Skin temperature gradient WNL b/l.  No edema noted b/l.  Sensation intact 5/5 b/l with 10 gram monofilament.  Dermatological Examination: Pedal skin thin, shiny and atrophic b/l.  Toenails 1-5 b/l discolored, thick, dystrophic with subungual debris and pain with palpation to nailbeds due to thickness of nails.  Musculoskeletal: Muscle strength 5/5 to all LE muscle groups.  No gross bony deformities b/l.  No pain, crepitus or joint limitation noted with ROM.   Assessment: Painful onychomycosis toenails 1-5 b/l   Plan: 1. Toenails 1-5 b/l were debrided in length and girth without iatrogenic bleeding. 2. Patient to continue soft, supportive shoe gear 3. Patient to report any pedal injuries to medical professional immediately. 4. Follow up 3 months.  5. Patient/POA to call should there be a concern in the interim.

## 2018-10-28 NOTE — Telephone Encounter (Signed)
Tyler Williams can you touch base with him? I'm happy to see him sooner if needed, or he can start back on aldactone if it was working well, maybe at a lower dose if it was causing frequent urination. He might have to accept the trade off of frequent urination for decreased swelling.

## 2018-11-05 ENCOUNTER — Telehealth: Payer: Self-pay | Admitting: Cardiovascular Disease

## 2018-11-05 DIAGNOSIS — M7989 Other specified soft tissue disorders: Secondary | ICD-10-CM

## 2018-11-05 MED ORDER — FUROSEMIDE 80 MG PO TABS: 80.0000 mg | ORAL_TABLET | Freq: Every day | ORAL | 3 refills | Status: DC

## 2018-11-05 NOTE — Telephone Encounter (Signed)
I would recommend increasing lasix to 80 mg daily starting tomorrow. Please arrange a BMET prior to his upcoming appointment. thanks

## 2018-11-05 NOTE — Telephone Encounter (Signed)
Called patient about his message. Patient complaining of swelling in BLE, patient stated more swelling in right than left. Patient stated the swelling goes down over night. Patient denies any pain in his BLE. Patient did state that compression stockings do help some. Patient stated he does get SOB with walking and he has gained 5 lbs in over a week. Discussed a low salt diet with patient and keeping his sodium below 2 grams daily. Explained to patient about checking labels on cans and processed food. Encouraged patient to watch the salt intake when he eats out at restaurants. Patient is currently taking spirolactone 25 mg daily and Lasix 40 mg daily. Patient has appointment with Dr. Burt Knack on 11/18/18. Informed patient that a message would be sent to Dr. Burt Knack for advisement.

## 2018-11-05 NOTE — Telephone Encounter (Signed)
Called patient back, wife stated to call back in one hour, that he had left the house but would be back shortly.

## 2018-11-05 NOTE — Telephone Encounter (Signed)
Called patient back with Dr. Burt Knack recommends. Patient will increase Lasix 80 mg daily starting tomorrow and will get BMET next Friday while he is in the office for his coumadin clinic appointment. Patient verbalized understanding.

## 2018-11-05 NOTE — Telephone Encounter (Signed)
° ° °  Pt c/o swelling: STAT is pt has developed SOB within 24 hours  1) How much weight have you gained and in what time span?   If swelling, where is the swelling located? Legs, ankles, feet 2) Are you currently taking a fluid pill? yes  3) Are you currently SOB? SOB at times  4) Do you have a log of your daily weights (if so, list)? 186 today, 188, 2 weeks ago was 181  5) Have you gained 3 pounds in a day or 5 pounds in a week?   6) Have you traveled recently? no

## 2018-11-07 NOTE — Telephone Encounter (Signed)
Follow Up  Patient states that his weight has dropped from 182.2 to 176.8 overnight. Patient wants to see if this is normal or if the medicine needs to alter the medication. Please give patient a call back to discuss.

## 2018-11-07 NOTE — Telephone Encounter (Signed)
Mr. Lugg is concerned because he started Lasix 80 mg today and has lost ~5 pounds since yesterday. After much discussion, it was realized he weighed himself yesterday in the afternoon when he was swollen and he weighed today after taking Lasix and his swelling had much improved. Reiterated to him to continue medications as directed at this time and to call if he starts to exhibit S/S of dehydration. Also reiterated to him to weigh in the morning, every day, under the same circumstances.  He will continue current plan and weigh himself daily and bring log to visit with Dr. Burt Knack 10/5. He was grateful for assistance.

## 2018-11-12 DIAGNOSIS — T1490XA Injury, unspecified, initial encounter: Secondary | ICD-10-CM | POA: Diagnosis not present

## 2018-11-12 DIAGNOSIS — Z7901 Long term (current) use of anticoagulants: Secondary | ICD-10-CM | POA: Diagnosis not present

## 2018-11-12 DIAGNOSIS — I482 Chronic atrial fibrillation, unspecified: Secondary | ICD-10-CM | POA: Diagnosis not present

## 2018-11-15 ENCOUNTER — Other Ambulatory Visit: Payer: Medicare Other | Admitting: *Deleted

## 2018-11-15 ENCOUNTER — Ambulatory Visit (INDEPENDENT_AMBULATORY_CARE_PROVIDER_SITE_OTHER): Payer: Medicare Other | Admitting: *Deleted

## 2018-11-15 ENCOUNTER — Other Ambulatory Visit: Payer: Self-pay

## 2018-11-15 DIAGNOSIS — M7989 Other specified soft tissue disorders: Secondary | ICD-10-CM | POA: Diagnosis not present

## 2018-11-15 DIAGNOSIS — I4891 Unspecified atrial fibrillation: Secondary | ICD-10-CM | POA: Diagnosis not present

## 2018-11-15 DIAGNOSIS — Z7901 Long term (current) use of anticoagulants: Secondary | ICD-10-CM | POA: Diagnosis not present

## 2018-11-15 DIAGNOSIS — Z5181 Encounter for therapeutic drug level monitoring: Secondary | ICD-10-CM | POA: Diagnosis not present

## 2018-11-15 LAB — POCT INR: INR: 3.8 — AB (ref 2.0–3.0)

## 2018-11-15 NOTE — Patient Instructions (Signed)
Description   Hold today and then take 1 tablet daily. Pt usually takes 1 tablet every day except for 1.5 tablets on Mondays.  Recheck INR in 1 week pt finishes doxy 10/7.  Call if placed on any new medications or if scheduled for any procedures.  Coumadin clinic [phone].

## 2018-11-16 LAB — BASIC METABOLIC PANEL
BUN/Creatinine Ratio: 26 — ABNORMAL HIGH (ref 10–24)
BUN: 37 mg/dL — ABNORMAL HIGH (ref 10–36)
CO2: 22 mmol/L (ref 20–29)
Calcium: 9.5 mg/dL (ref 8.6–10.2)
Chloride: 103 mmol/L (ref 96–106)
Creatinine, Ser: 1.43 mg/dL — ABNORMAL HIGH (ref 0.76–1.27)
GFR calc Af Amer: 49 mL/min/{1.73_m2} — ABNORMAL LOW (ref >59)
GFR calc non Af Amer: 43 mL/min/{1.73_m2} — ABNORMAL LOW (ref >59)
Glucose: 94 mg/dL (ref 65–99)
Potassium: 4.8 mmol/L (ref 3.5–5.2)
Sodium: 139 mmol/L (ref 134–144)

## 2018-11-18 ENCOUNTER — Ambulatory Visit (INDEPENDENT_AMBULATORY_CARE_PROVIDER_SITE_OTHER): Payer: Medicare Other | Admitting: Cardiovascular Disease

## 2018-11-18 ENCOUNTER — Encounter: Payer: Self-pay | Admitting: Cardiovascular Disease

## 2018-11-18 ENCOUNTER — Other Ambulatory Visit: Payer: Self-pay

## 2018-11-18 VITALS — BP 140/64 | HR 59 | Ht 68.0 in | Wt 179.8 lb

## 2018-11-18 DIAGNOSIS — I2510 Atherosclerotic heart disease of native coronary artery without angina pectoris: Secondary | ICD-10-CM | POA: Diagnosis not present

## 2018-11-18 DIAGNOSIS — I359 Nonrheumatic aortic valve disorder, unspecified: Secondary | ICD-10-CM | POA: Diagnosis not present

## 2018-11-18 DIAGNOSIS — I4821 Permanent atrial fibrillation: Secondary | ICD-10-CM

## 2018-11-18 DIAGNOSIS — I5032 Chronic diastolic (congestive) heart failure: Secondary | ICD-10-CM | POA: Diagnosis not present

## 2018-11-18 MED ORDER — AMLODIPINE BESYLATE 5 MG PO TABS: 5.0000 mg | ORAL_TABLET | Freq: Every day | ORAL | 3 refills | Status: DC

## 2018-11-18 NOTE — Patient Instructions (Signed)
Medication Instructions:  1) DECREASE AMLODIPINE to 5 mg daily  Labwork: None  Testing/Procedures: None  Follow-Up: Your provider wants you to follow-up in: 6 months with Dr. Burt Knack. You will receive a reminder letter in the mail two months in advance. If you don't receive a letter, please call our office to schedule the follow-up appointment.    Any Other Special Instructions Will Be Listed Below (If Applicable).     If you need a refill on your cardiac medications before your next appointment, please call your pharmacy.

## 2018-11-18 NOTE — Progress Notes (Signed)
Cardiology Office Note:    Date:  11/18/2018   ID:  [name], DOB 10/15/1926, MRN [id number]  PCP:  Burnard Bunting, MD  Cardiologist:  Sherren Mocha, MD  Electrophysiologist:  None   Referring MD: Burnard Bunting, MD   Chief Complaint  Patient presents with  . Leg Swelling    History of Present Illness:    Tyler Williams is a 85 y.o. male with a hx of atrial fibrillation, coronary artery disease, peripheral vascular disease, abdominal aortic aneurysm status post repair, and aortic stenosis status post TAVR in 2018.  The patient is maintained on long-term warfarin for anticoagulation.  He is also followed for chronic diastolic heart failure.  We have recently adjusted his diuretics to treat weight gain and leg swelling.  He is currently taking Spironolactone 25 mg daily and furosemide 80 mg daily.  His leg swelling has improved.  He continues to wear compression stockings.  He denies chest pain or shortness of breath.  He denies orthopnea or PND.  He has had no heart palpitations of late.  He complains of breast tenderness since taking Spironolactone.  Past Medical History:  Diagnosis Date  . A-fib ([place])   . AAA (abdominal aortic aneurysm) ([place])    s/p repair  . Aortic stenosis, mild    a. mean gradient 17 mmHg on 04/2012 TTE  . Arthritis    knees  . CAD (coronary artery disease)    a. s/p multiple percutaneous prior coronary artery interventions b. 05/2012: unsuccessful PCI to tight RCA->medically managed  . Cancer [facility])    skin cancer removed, right hand, left leg, chest  . Carotid artery disease ([place])    0-39% bilateral ICA stenoses 11/2011  . Chronic anticoagulation    Coumadin  . Coronary artery disease   . GERD (gastroesophageal reflux disease)   . GERD (gastroesophageal reflux disease)   . History of pulmonary embolism    1964 related to dislocated hip on right   . History of skin cancer   . Hypercholesteremia   . Hypertension   . Incidental  cecal mass noted on CT imaging 05/24/2016   **An incidental finding of potential clinical significance has been found. 4.6 x 5.6 x 3.7 cm masslike lesion in the cecum adjacent to the ileocecal valve may represent a large polyp or colonic neoplasm. Correlation with nonemergent colonoscopy is strongly recommended in the near future to better evaluate this finding.**  . Macular degeneration    eye injections  . Microscopic hematuria    Followed by urology  . Permanent atrial fibrillation ([place])   . PVD (peripheral vascular disease) ([place])    Bilateral renal artery atherosclerosis, SMA stenosis with collateralization  . RBBB    Chronic  . 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 SSS (sick sinus syndrome) [facility])     Past Surgical History:  Procedure Laterality Date  . ABDOMINAL AORTIC ANEURYSM REPAIR     1993   . CARDIAC CATHETERIZATION  05/24/2012   pD1 20%, oCFX 20%, mCFX 30%, ostial Int Br 30%, distal AV groove CFX 30%, pRCA 30-40%, mRCA 99%, dRCA 30%, PL branch small with diffuse 40%. Unable to pass wire past RCA lesion->medically managed  . CHOLECYSTECTOMY N/A 12/05/2012   Procedure: LAPAROSCOPIC CHOLECYSTECTOMY ;  Surgeon: Edward Jolly, MD;  Location: [place];  Service: General;  Laterality: N/A;  . CHOLECYSTECTOMY    .  CORONARY ANGIOPLASTY  1980, 1990  . CORONARY BALLOON ANGIOPLASTY N/A 08/31/2017   Procedure: CORONARY BALLOON ANGIOPLASTY;  Surgeon: Martinique, Peter M, MD;  Location: [place] CV LAB;  Service: Cardiovascular;  Laterality: N/A;  . EYE SURGERY     hx of cataract surgery   . [place]  . HERNIA REPAIR     right inguinal hernia repair   . JOINT REPLACEMENT     left knee replacement, partial right  . LEFT HEART CATH AND CORONARY ANGIOGRAPHY N/A 08/31/2017   Procedure: LEFT HEART CATH AND CORONARY ANGIOGRAPHY;  Surgeon: Martinique,  Peter M, MD;  Location: [place] CV LAB;  Service: Cardiovascular;  Laterality: N/A;  . LEFT HEART CATHETERIZATION WITH CORONARY ANGIOGRAM N/A 05/24/2012   Procedure: LEFT HEART CATHETERIZATION WITH CORONARY ANGIOGRAM;  Surgeon: Burnell Blanks, MD;  Location: [facility] CATH LAB;  Service: Cardiovascular;  Laterality: N/A;  . ORTHOPEDIC SURGERY    . OTHER SURGICAL HISTORY     right knee popliteal aneurysm surgery stent placed   . OTHER SURGICAL HISTORY    . PARTIAL KNEE ARTHROPLASTY  01/22/2012   Procedure: UNICOMPARTMENTAL KNEE;  Surgeon: Mauri Pole, MD;  Location: [facility];  Service: Orthopedics;  Laterality: Right;  . PERCUTANEOUS CORONARY INTERVENTION-BALLOON ONLY  05/24/2012   Procedure: PERCUTANEOUS CORONARY INTERVENTION-BALLOON ONLY;  Surgeon: Burnell Blanks, MD;  Location: [facility] C[facility] LAB;  Service: Cardiovascular;;  . RIGHT/LEFT HEART CATH AND CORONARY ANGIOGRAPHY N/A 05/12/2016   Procedure: Right/Left Heart Cath and Coronary Angiography;  Surgeon: Sherren Mocha, MD;  Location: [place] CV LAB;  Service: Cardiovascular;  Laterality: N/A;  . TEE WITHOUT CARDIOVERSION N/A 06/13/2016   Procedure: TRANSESOPHAGEAL ECHOCARDIOGRAM (TEE);  Surgeon: Sherren Mocha, MD;  Location: [place];  Service: Open Heart Surgery;  Laterality: N/A;  . TONSILLECTOMY    . TOTAL KNEE ARTHROPLASTY Left   . TRANSCATHETER AORTIC VALVE REPLACEMENT, TRANSFEMORAL N/A 06/13/2016   Procedure: TRANSCATHETER AORTIC VALVE REPLACEMENT, TRANSFEMORAL;  Surgeon: Sherren Mocha, MD;  Location: [place];  Service: Open Heart Surgery;  Laterality: N/A;    Current Medications: Current Meds  Medication Sig  . amLODipine (NORVASC) 5 MG tablet Take 1 tablet (5 mg total) by mouth daily.  Marland Kitchen aspirin EC 81 MG tablet Take 81 mg by mouth daily.  Marland Kitchen atorvastatin (LIPITOR) 20 MG tablet TAKE 1 TABLET ONCE DAILY.  . Calcium-Magnesium-Zinc (CAL-MAG-ZINC PO) Take 1 tablet by mouth daily.  . Cholecalciferol (VITAMIN D3) 5000 units TABS  Take 1 tablet by mouth daily.  . Coenzyme Q10 (COQ10) 100 MG CAPS Take 1 tablet by mouth daily.  . fesoterodine (TOVIAZ) 8 MG TB24 tablet Take 8 mg by mouth every evening.  . furosemide (LASIX) 80 MG tablet Take 1 tablet (80 mg total) by mouth daily.  . isosorbide mononitrate (IMDUR) 60 MG 24 hr tablet Take 1.5 tablets (90 mg total) by mouth daily.  Marland Kitchen LORazepam (ATIVAN) 1 MG tablet Take 1 mg by mouth at bedtime.  . metoprolol succinate (TOPROL-XL) 25 MG 24 hr tablet Take 1 tablet (25 mg total) by mouth daily.  . Multiple Vitamins-Minerals (PRESERVISION AREDS 2 PO) Take 1 tablet by mouth 2 (two) times daily.  . nitroGLYCERIN (NITROSTAT) 0.4 MG SL tablet Place 0.4 mg under the tongue every 5 (five) minutes as needed for chest pain.  . ramipril (ALTACE) 10 MG capsule Take 10 mg by mouth 2 (two) times daily.  . ranibizumab (LUCENTIS) 0.5 MG/0.05ML SOLN 0.5 mg by Intravitreal route See  admin instructions. Every 9 weeks  . spironolactone (ALDACTONE) 25 MG tablet Take 25 mg by mouth daily.  Marland Kitchen warfarin (COUMADIN) 5 MG tablet Take 5-7.5 mg by mouth See admin instructions. Take 1 and 1/2 tablets on Monday then take 1 tablet all the other days  . [DISCONTINUED] amLODipine (NORVASC) 10 MG tablet Take 10 mg by mouth daily.     Allergies:   No known allergies   Social History   Socioeconomic History  . Marital status: Single    Spouse name: Not on file  . Number of children: Not on file  . Years of education: Not on file  . Highest education level: Not on file  Occupational History  . Not on file  Social Needs  . Financial resource strain: Not on file  . Food insecurity    Worry: Not on file    Inability: Not on file  . Transportation needs    Medical: Not on file    Non-medical: Not on file  Tobacco Use  . Smoking status: Never Smoker  . Smokeless tobacco: Never Used  Substance and Sexual Activity  . Alcohol use: Not Currently    Frequency: Never    Comment: patient doesn't drink anymore   . Drug use: Never  . Sexual activity: Not on file  Lifestyle  . Physical activity    Days per week: Not on file    Minutes per session: Not on file  . Stress: Not on file  Relationships  . Social Herbalist on phone: Not on file    Gets together: Not on file    Attends religious service: Not on file    Active member of club or organization: Not on file    Attends meetings of clubs or organizations: Not on file    Relationship status: Not on file  Other Topics Concern  . Not on file  Social History Narrative   ** Merged History Encounter **       [place]; played football; score touchdown against [employer]     Family History: The patient's family history includes Heart attack (age of onset: 44) in his mother.  ROS:   Please see the history of present illness.    All other systems reviewed and are negative.  EKGs/Labs/Other Studies Reviewed:    The following studies were reviewed today: Echo 08-28-2017: Study Conclusions  - Left ventricle: The cavity size was normal. Wall thickness was   increased in a pattern of mild LVH. Systolic function was normal.   The estimated ejection fraction was in the range of 55% to 60%.   There is akinesis of the basalinferolateral myocardium. - Aortic valve: A bioprosthesis was present. There was trivial   regurgitation. - Mitral valve: Calcified annulus. There was mild regurgitation. - Left atrium: The atrium was severely dilated. - Right atrium: The atrium was moderately dilated. - Pulmonary arteries: Systolic pressure was mildly increased. PA   peak pressure: 43 mm Hg (S).  Impressions:  - Akinesis of the inferolateral wall with overall preserved LV   systolic function; s/p AVR with trace AI; mild MR; biatrial   enlargement; trace TR with mild pulmonary hypertension.  EKG:  EKG is not ordered today.   Recent Labs: 11/15/2018: BUN 37; Creatinine, Ser 1.43; Potassium 4.8; Sodium 139  Recent Lipid Panel     Component Value Date/Time   CHOL 149 08/28/2017 0350   TRIG 95 08/28/2017 0350   HDL 52 08/28/2017 0350  CHOLHDL 2.9 08/28/2017 0350   VLDL 19 08/28/2017 0350   LDLCALC 78 08/28/2017 0350    Physical Exam:    VS:  BP 140/64   Pulse (!) 59   Ht 5\' 8"  (1.727 m)   Wt 179 lb 12.8 oz (81.6 kg)   SpO2 98%   BMI 27.34 kg/m     Wt Readings from Last 3 Encounters:  11/18/18 179 lb 12.8 oz (81.6 kg)  06/25/18 175 lb (79.4 kg)  01/02/18 195 lb 12.8 oz (88.8 kg)     GEN:  Well nourished, well developed in no acute distress HEENT: Normal NECK: No JVD; No carotid bruits LYMPHATICS: No lymphadenopathy CARDIAC: Irregular, 2/6 systolic ejection murmur at the right upper sternal border RESPIRATORY:  Clear to auscultation without rales, wheezing or rhonchi  ABDOMEN: Soft, non-tender, non-distended MUSCULOSKELETAL: Trace bilateral pretibial edema; No deformity  SKIN: Warm and dry NEUROLOGIC:  Alert and oriented x 3 PSYCHIATRIC:  Normal affect   ASSESSMENT:    1. Permanent atrial fibrillation ([place])   2. Coronary artery disease involving native coronary artery of native heart without angina pectoris   3. Chronic diastolic CHF (congestive heart failure) ([place])   4. Aortic valve disease    PLAN:    In order of problems listed above:  1. Patient asymptomatic, heart rate controlled on current therapy.  Tolerating warfarin. 2. Doing well without symptoms of angina.  He continues on aspirin for antiplatelet therapy.  He is treated with a high intensity statin drug. 3. The patient's volume status is improved.  His weight is down 10 pounds on current therapy.  We discussed consideration of further medicine adjustment.  Breast tenderness is not bothering him much and he prefers to stay on Spironolactone for now.  We will continue his current doses of Spironolactone and furosemide.  I think venous insufficiency is making a significant contribution to his leg swelling.  I asked him to reduce  amlodipine from 10 down to 5 mg daily with hopes that his leg swelling will improve with decreasing his calcium channel blocker dose. 4. Normal function of his transcatheter aortic valve on most recent echo.  Continue SBE prophylaxis when indicated.   Medication Adjustments/Labs and Tests Ordered: Current medicines are reviewed at length with the patient today.  Concerns regarding medicines are outlined above.  No orders of the defined types were placed in this encounter.  Meds ordered this encounter  Medications  . amLODipine (NORVASC) 5 MG tablet    Sig: Take 1 tablet (5 mg total) by mouth daily.    Dispense:  90 tablet    Refill:  3    Patient Instructions  Medication Instructions:  1) DECREASE AMLODIPINE to 5 mg daily  Labwork: None  Testing/Procedures: None  Follow-Up: Your provider wants you to follow-up in: 6 months with Dr. Burt Knack. You will receive a reminder letter in the mail two months in advance. If you don't receive a letter, please call our office to schedule the follow-up appointment.    Any Other Special Instructions Will Be Listed Below (If Applicable).     If you need a refill on your cardiac medications before your next appointment, please call your pharmacy.      Signed, Sherren Mocha, MD  11/18/2018 5:47 PM    [place]

## 2018-11-19 ENCOUNTER — Telehealth: Payer: Self-pay

## 2018-11-19 DIAGNOSIS — I5032 Chronic diastolic (congestive) heart failure: Secondary | ICD-10-CM

## 2018-11-19 NOTE — Telephone Encounter (Signed)
-----   Message from Sherren Mocha, MD sent at 11/19/2018  9:42 AM EDT ----- Reviewed with patient at Tyler [facility]. Should repeat BMET in one month. thx

## 2018-11-19 NOTE — Telephone Encounter (Signed)
Reviewed results with patient who verbalized understanding.   Repeat BMET scheduled 11/7. The patient agrees with treatment plan.

## 2018-11-22 ENCOUNTER — Telehealth: Payer: Self-pay | Admitting: Cardiovascular Disease

## 2018-11-22 NOTE — Telephone Encounter (Signed)
Patient stated he would like to stop spironolactone due to his breast tenderness. Patient only wants to stop if Dr. Burt Knack is okay with this. Will send to Dr. Burt Knack for advisement.

## 2018-11-22 NOTE — Telephone Encounter (Signed)
New Message:    Pt says he wants to tell Dr Burt Knack that he wants to discontinue his Spironolactone.

## 2018-11-24 NOTE — Telephone Encounter (Signed)
Yes I'm fine with this. Agree with plan to STOP spironolactone. thx

## 2018-11-25 ENCOUNTER — Ambulatory Visit (INDEPENDENT_AMBULATORY_CARE_PROVIDER_SITE_OTHER): Payer: Medicare Other | Admitting: Pharmacist

## 2018-11-25 ENCOUNTER — Other Ambulatory Visit: Payer: Self-pay

## 2018-11-25 DIAGNOSIS — Z5181 Encounter for therapeutic drug level monitoring: Secondary | ICD-10-CM

## 2018-11-25 DIAGNOSIS — I4891 Unspecified atrial fibrillation: Secondary | ICD-10-CM | POA: Diagnosis not present

## 2018-11-25 LAB — POCT INR: INR: 3.3 — AB (ref 2.0–3.0)

## 2018-11-25 NOTE — Telephone Encounter (Signed)
Pt aware may discontinue med./cy

## 2018-11-25 NOTE — Patient Instructions (Signed)
Description   Take 1 tablet today, then continue taking 1 tablet every day except for 1.5 tablets on Mondays. Recheck INR in 3 weeks. Call if placed on any new medications or if scheduled for any procedures.  Coumadin clinic [phone].

## 2018-11-28 DIAGNOSIS — H353231 Exudative age-related macular degeneration, bilateral, with active choroidal neovascularization: Secondary | ICD-10-CM | POA: Diagnosis not present

## 2018-12-02 DIAGNOSIS — N3281 Overactive bladder: Secondary | ICD-10-CM | POA: Diagnosis not present

## 2018-12-02 DIAGNOSIS — N3941 Urge incontinence: Secondary | ICD-10-CM | POA: Diagnosis not present

## 2018-12-04 ENCOUNTER — Other Ambulatory Visit (HOSPITAL_COMMUNITY): Payer: Self-pay | Admitting: *Deleted

## 2018-12-04 DIAGNOSIS — I5032 Chronic diastolic (congestive) heart failure: Secondary | ICD-10-CM

## 2018-12-05 DIAGNOSIS — D485 Neoplasm of uncertain behavior of skin: Secondary | ICD-10-CM | POA: Diagnosis not present

## 2018-12-05 DIAGNOSIS — L570 Actinic keratosis: Secondary | ICD-10-CM | POA: Diagnosis not present

## 2018-12-05 DIAGNOSIS — Z85828 Personal history of other malignant neoplasm of skin: Secondary | ICD-10-CM | POA: Diagnosis not present

## 2018-12-05 DIAGNOSIS — I872 Venous insufficiency (chronic) (peripheral): Secondary | ICD-10-CM | POA: Diagnosis not present

## 2018-12-05 DIAGNOSIS — D225 Melanocytic nevi of trunk: Secondary | ICD-10-CM | POA: Diagnosis not present

## 2018-12-05 DIAGNOSIS — L821 Other seborrheic keratosis: Secondary | ICD-10-CM | POA: Diagnosis not present

## 2018-12-05 DIAGNOSIS — I8312 Varicose veins of left lower extremity with inflammation: Secondary | ICD-10-CM | POA: Diagnosis not present

## 2018-12-05 DIAGNOSIS — I8311 Varicose veins of right lower extremity with inflammation: Secondary | ICD-10-CM | POA: Diagnosis not present

## 2018-12-16 ENCOUNTER — Telehealth: Payer: Self-pay | Admitting: Cardiovascular Disease

## 2018-12-16 NOTE — Telephone Encounter (Signed)
Spoke to the patient at great length about increased blood pressure and his grieving over the loss of his wife. His pressures have been elevated since her death a week ago (up to 180s/90s). Upon further discussion, he reports he checks his BP prior to taking his medications. Instructed him to take his medications and then check his BP 1-2 hours later. He will continue to monitor BP and call if BP is consistently elevated while treated. He will also call Dr. Reynaldo Minium to discuss lorazepam and if he can use more frequently while processing his wife's death. He was grateful for assistance.

## 2018-12-16 NOTE — Telephone Encounter (Signed)
I spoke to the patient who is calling because his wife died a week ago Jun 02, 2022 and his BP has elevated since then to 181/80 last night and 176/91 this morning.  He is asymptomatic, but is grieving the loss.  He was wondering if his medications should be adjusted or just continue monitoring.  Please advise, thank you

## 2018-12-16 NOTE — Telephone Encounter (Signed)
Pt c/o BP issue: STAT if pt c/o blurred vision, one-sided weakness or slurred speech  1. What are your last 5 BP readings?  181/80, 176/91 this morning last night  2e you having any other symptoms (ex. Dizziness, headache, blurred vision, passed out)? No  3. What is your BP issue? Blood Pressure is running high- Daughter wanted you to know pt;s wife died last 06-08-22 and he is not doing well at all with her death

## 2018-12-18 NOTE — Progress Notes (Signed)
Mr. Lacinda Axon will begin the 12-week PREP at [facility] to run Tu/Th from 2:30-3:45.

## 2018-12-19 ENCOUNTER — Telehealth: Payer: Self-pay

## 2018-12-20 ENCOUNTER — Other Ambulatory Visit: Payer: Self-pay

## 2018-12-20 ENCOUNTER — Other Ambulatory Visit: Payer: Medicare Other | Admitting: *Deleted

## 2018-12-20 ENCOUNTER — Ambulatory Visit (INDEPENDENT_AMBULATORY_CARE_PROVIDER_SITE_OTHER): Payer: Medicare Other

## 2018-12-20 DIAGNOSIS — Z5181 Encounter for therapeutic drug level monitoring: Secondary | ICD-10-CM | POA: Diagnosis not present

## 2018-12-20 DIAGNOSIS — I4821 Permanent atrial fibrillation: Secondary | ICD-10-CM | POA: Diagnosis not present

## 2018-12-20 DIAGNOSIS — I5032 Chronic diastolic (congestive) heart failure: Secondary | ICD-10-CM | POA: Diagnosis not present

## 2018-12-20 DIAGNOSIS — I4891 Unspecified atrial fibrillation: Secondary | ICD-10-CM | POA: Diagnosis not present

## 2018-12-20 LAB — BASIC METABOLIC PANEL
BUN/Creatinine Ratio: 24 (ref 10–24)
BUN: 31 mg/dL (ref 10–36)
CO2: 21 mmol/L (ref 20–29)
Calcium: 9.1 mg/dL (ref 8.6–10.2)
Chloride: 99 mmol/L (ref 96–106)
Creatinine, Ser: 1.29 mg/dL — ABNORMAL HIGH (ref 0.76–1.27)
GFR calc Af Amer: 56 mL/min/{1.73_m2} — ABNORMAL LOW (ref >59)
GFR calc non Af Amer: 48 mL/min/{1.73_m2} — ABNORMAL LOW (ref >59)
Glucose: 95 mg/dL (ref 65–99)
Potassium: 4.8 mmol/L (ref 3.5–5.2)
Sodium: 137 mmol/L (ref 134–144)

## 2018-12-20 LAB — POCT INR: INR: 2.8 (ref 2.0–3.0)

## 2018-12-20 NOTE — Patient Instructions (Signed)
Description   Continue on same dosage 1 tablet every day except for 1.5 tablets on Mondays. Recheck INR in 4 weeks. Call if placed on any new medications or if scheduled for any procedures.  Coumadin clinic [phone].

## 2019-01-02 DIAGNOSIS — H353231 Exudative age-related macular degeneration, bilateral, with active choroidal neovascularization: Secondary | ICD-10-CM | POA: Diagnosis not present

## 2019-01-02 NOTE — Progress Notes (Signed)
[facility] [employer] PREP Weekly Session   Patient Details  Name: Tyler Williams MRN: [id number] Date of Birth: 02/08/1927 Age: 85 y.o. PCP: Burnard Bunting, MD  There were no vitals filed for this visit.  Cardio: 2 min march: 155 30 sec chair stand: 10 RT arm 8lb curl: 16  Balance: Double: 0 Single: 1 (assisted) Tandem: 2 (feet spaced)   Vanita Ingles 01/02/2019, 9:24 PM

## 2019-01-08 NOTE — Progress Notes (Signed)
[facility] [employer] PREP Weekly Session   Patient Details  Name: Tyler Williams MRN: [id number] Date of Birth: 1926/08/25 Age: 85 y.o. PCP: Burnard Bunting, MD  Vitals:   01/08/19 1629  Weight: 182 lb 12.8 oz (82.9 kg)    [facility] [employer] Weekly seesion - 01/08/19 1600      Weekly Session   Topic Discussed  Healthy eating tips;Health habits    Minutes exercised this week  --   none reported     Comments: 'great let's go'   Pam M Wheaton [facility] 01/08/2019, 4:30 PM

## 2019-01-08 NOTE — Progress Notes (Signed)
[facility] PREP Weekly Session   Patient Details  Name: Tyler Williams MRN: [id number] Date of Birth: [birth year] Age: 85 y.o. PCP: Burnard Bunting, MD  Vitals:   01/08/19 1223  Weight: 183 lb 9.6 oz (83.3 kg)       Pam Tally Joe 01/08/2019, 12:24 PM

## 2019-01-15 NOTE — Progress Notes (Signed)
[facility] [employer] PREP Weekly Session   Patient Details  Name: Tyler Williams MRN: [id number] Date of Birth: Dec 11, 1926 Age: 85 y.o. PCP: Burnard Bunting, MD  Vitals:   01/14/19 1155  Weight: 187 lb (84.8 kg)    [facility] [employer] Weekly seesion - 01/14/19 1156      Weekly Session   Topic Discussed  Other ways to be active    Minutes exercised this week  135 minutes   90cardio/45strength     Fun things you did since last mtg:"thanksgiving" Things you are grateful for:"my family, family's health, kids, getting along" Barriers:"kids not getting along, missing wife"  Vanita Ingles 01/15/2019, 11:56 AM

## 2019-01-17 ENCOUNTER — Other Ambulatory Visit: Payer: Self-pay

## 2019-01-17 ENCOUNTER — Ambulatory Visit (INDEPENDENT_AMBULATORY_CARE_PROVIDER_SITE_OTHER): Payer: Medicare Other | Admitting: *Deleted

## 2019-01-17 DIAGNOSIS — I4891 Unspecified atrial fibrillation: Secondary | ICD-10-CM | POA: Diagnosis not present

## 2019-01-17 DIAGNOSIS — Z5181 Encounter for therapeutic drug level monitoring: Secondary | ICD-10-CM | POA: Diagnosis not present

## 2019-01-17 LAB — POCT INR: INR: 2.5 (ref 2.0–3.0)

## 2019-01-17 NOTE — Patient Instructions (Signed)
Description   Continue on same dosage 1 tablet every day except for 1.5 tablets on Mondays. Recheck INR in 5 weeks. Call if placed on any new medications or if scheduled for any procedures.  Coumadin clinic [phone].

## 2019-01-24 ENCOUNTER — Ambulatory Visit (INDEPENDENT_AMBULATORY_CARE_PROVIDER_SITE_OTHER): Payer: Medicare Other | Admitting: Podiatry

## 2019-01-24 ENCOUNTER — Other Ambulatory Visit: Payer: Self-pay

## 2019-01-24 DIAGNOSIS — M79674 Pain in right toe(s): Secondary | ICD-10-CM | POA: Diagnosis not present

## 2019-01-24 DIAGNOSIS — B351 Tinea unguium: Secondary | ICD-10-CM | POA: Diagnosis not present

## 2019-01-24 DIAGNOSIS — Z9229 Personal history of other drug therapy: Secondary | ICD-10-CM

## 2019-01-24 DIAGNOSIS — M79675 Pain in left toe(s): Secondary | ICD-10-CM

## 2019-01-26 ENCOUNTER — Other Ambulatory Visit: Payer: Self-pay | Admitting: Physician Assistant

## 2019-01-26 ENCOUNTER — Other Ambulatory Visit: Payer: Self-pay | Admitting: Cardiovascular Disease

## 2019-01-27 ENCOUNTER — Other Ambulatory Visit: Payer: Self-pay | Admitting: *Deleted

## 2019-01-27 MED ORDER — METOPROLOL SUCCINATE ER 25 MG PO TB24: 25.0000 mg | ORAL_TABLET | Freq: Every day | ORAL | 2 refills | Status: DC

## 2019-01-30 ENCOUNTER — Other Ambulatory Visit: Payer: Self-pay | Admitting: *Deleted

## 2019-01-30 ENCOUNTER — Ambulatory Visit: Payer: Medicare Other | Admitting: *Deleted

## 2019-01-30 NOTE — Patient Outreach (Signed)
[facility]) Care Management  01/30/2019  Tyler Williams 04-19-1926 [id number]  RN Health Coach attempted follow up outreach screening call to patient.  Patient was unavailable.Phone line busy. Plan: RN will call patient again within 10 business days  [place] Management [phone]

## 2019-01-31 ENCOUNTER — Encounter: Payer: Self-pay | Admitting: *Deleted

## 2019-02-01 ENCOUNTER — Encounter: Payer: Self-pay | Admitting: Podiatry

## 2019-02-01 NOTE — Progress Notes (Signed)
Subjective: Tyler Williams is seen today for follow up painful, elongated, thickened toenails bilateral feet that he cannot cut. Pain interferes with daily activities. Aggravating factor includes wearing enclosed shoe gear and relieved with periodic debridement.  He remains on blood thinner, warfarin.   Medications reviewed in chart.  Allergies  Allergen Reactions  . No Known Allergies      Objective:  Vascular Examination: Capillary refill time <3 seconds b/l.  Dorsalis pedis present b/l.  Posterior tibial pulses present b/l.  Digital hair absent b/l.  Skin temperature gradient WNL b/l.   Dermatological Examination: Skin with normal turgor, texture and tone b/l.  Toenails 1-5 b/l discolored, thick, dystrophic with subungual debris and pain with palpation to nailbeds due to thickness of nails.  Musculoskeletal: Muscle strength 5/5 to all LE muscle groups b/l.  No gross bony deformities b/l.  No pain, crepitus or joint limitation noted with ROM.   Neurological Examination: Protective sensation intact 5/5 with 10 gram monofilament bilaterally.  Assessment: Painful onychomycosis toenails 1-5 b/l  Pt on long term blood thinner  Plan: 1. No new findings. No new orders. 2. Toenails 1-5 b/l were debrided in length and girth without iatrogenic bleeding. 3. Patient to continue soft, supportive shoe gear daily. 4. Patient to report any pedal injuries to medical professional immediately. 5. Follow up 3 months.  6. Patient/POA to call should there be a concern in the interim.

## 2019-02-04 ENCOUNTER — Other Ambulatory Visit: Payer: Self-pay | Admitting: Physician Assistant

## 2019-02-21 ENCOUNTER — Other Ambulatory Visit: Payer: Self-pay | Admitting: *Deleted

## 2019-02-21 NOTE — Telephone Encounter (Signed)
This encounter was created in error - please disregard.

## 2019-02-21 NOTE — Patient Outreach (Signed)
[place] [facility]) Care Management  02/21/2019  Josiyah Henao [birth year] [id number]   RN Health Coach telephone call to patient.  [employer] compliance verified. Outreach successful: patient quickly stated that he was not interested in program and hung up.  Plan:  RN will send unsuccessful outreach letter and [place] Management [phone]

## 2019-02-25 ENCOUNTER — Telehealth: Payer: Self-pay | Admitting: *Deleted

## 2019-02-25 DIAGNOSIS — Z20828 Contact with and (suspected) exposure to other viral communicable diseases: Secondary | ICD-10-CM | POA: Diagnosis not present

## 2019-02-25 NOTE — Telephone Encounter (Signed)
Pt called to report he is undergoing testing for COVID 19 since he was around his son in the past 6 days which has COVID. Pt states he has no symptoms but needed to be tested to ensure he was fine. He is aware that the appt for tomorrow must be canceled until his results return and no symptoms. Pt will call back to reschedule.

## 2019-02-27 ENCOUNTER — Telehealth: Payer: Self-pay | Admitting: Cardiovascular Disease

## 2019-02-27 NOTE — Telephone Encounter (Signed)
New Message      We are recommending the COVID-19 vaccine to all of our patients. Cardiac medications (including blood thinners) should not deter anyone from being vaccinated and there is no need to hold any of those medications prior to vaccine administration.     Currently, there is a hotline to call (active 02/21/19) to schedule vaccination appointments as no walk-ins will be accepted.   Number: [phone]    If you have further questions or concerns about the vaccine process, please visit [email or web address] or contact your primary care physician.         

## 2019-03-05 ENCOUNTER — Ambulatory Visit: Payer: Medicare Other | Attending: Internal Medicine

## 2019-03-05 DIAGNOSIS — Z23 Encounter for immunization: Secondary | ICD-10-CM | POA: Diagnosis not present

## 2019-03-05 NOTE — Progress Notes (Signed)
   Covid-19 Vaccination Clinic  Name:  Tyler Williams    MRN: [id number] DOB: [birth year]  03/05/2019  Tyler Williams was observed post Covid-19 immunization for 15 minutes without incidence. He was provided with Vaccine Information Sheet and instruction to access the V-Safe system.   Tyler Williams was instructed to call 911 with any severe reactions post vaccine: Marland Kitchen Difficulty breathing  . Swelling of your face and throat  . A fast heartbeat  . A bad rash all over your body  . Dizziness and weakness    Immunizations Administered    Name Date Dose VIS Date Route   Pfizer COVID-19 Vaccine 03/05/2019  6:27 PM 0.3 mL 01/24/2019 Intramuscular   Manufacturer: [place]   Lot: [id number]   [place]: [id number]

## 2019-03-06 DIAGNOSIS — H353231 Exudative age-related macular degeneration, bilateral, with active choroidal neovascularization: Secondary | ICD-10-CM | POA: Diagnosis not present

## 2019-03-07 ENCOUNTER — Ambulatory Visit (INDEPENDENT_AMBULATORY_CARE_PROVIDER_SITE_OTHER): Payer: Medicare Other | Admitting: *Deleted

## 2019-03-07 ENCOUNTER — Other Ambulatory Visit: Payer: Self-pay

## 2019-03-07 DIAGNOSIS — Z5181 Encounter for therapeutic drug level monitoring: Secondary | ICD-10-CM | POA: Diagnosis not present

## 2019-03-07 DIAGNOSIS — I4891 Unspecified atrial fibrillation: Secondary | ICD-10-CM | POA: Diagnosis not present

## 2019-03-07 LAB — POCT INR: INR: 2.7 (ref 2.0–3.0)

## 2019-03-07 NOTE — Patient Instructions (Signed)
Description   Continue on same dosage 1 tablet every day except for 1.5 tablets on Mondays. Recheck INR in 6 weeks. Call if placed on any new medications or if scheduled for any procedures.  Coumadin clinic [phone].

## 2019-03-24 ENCOUNTER — Telehealth: Payer: Self-pay | Admitting: Cardiovascular Disease

## 2019-03-24 ENCOUNTER — Ambulatory Visit: Payer: Medicare Other | Attending: Internal Medicine

## 2019-03-24 DIAGNOSIS — Z23 Encounter for immunization: Secondary | ICD-10-CM

## 2019-03-24 NOTE — Progress Notes (Signed)
   Covid-19 Vaccination Clinic  Name:  Tyler Williams    MRN: [id number] DOB: [birth year]  03/24/2019  [name] was observed post Covid-19 immunization for 15 minutes without incidence. He was provided with Vaccine Information Sheet and instruction to access the V-Safe system.   [name] was instructed to call 911 with any severe reactions post vaccine: Marland Kitchen Difficulty breathing  . Swelling of your face and throat  . A fast heartbeat  . A bad rash all over your body  . Dizziness and weakness    Immunizations Administered    Name Date Dose VIS Date Route   Pfizer COVID-19 Vaccine 03/24/2019  1:02 PM 0.3 mL 01/24/2019 Intramuscular   Manufacturer: [place]   Lot: [id number]   [place]: [id number]

## 2019-03-24 NOTE — Telephone Encounter (Signed)
Pt c/o BP issue: STAT if pt c/o blurred vision, one-sided weakness or slurred speech  1. What are your last 5 BP readings?   196/86   2. Are you having any other symptoms (ex. Dizziness, headache, blurred vision, passed out)? No 3. What is your BP issue? BP has been elevated.

## 2019-03-24 NOTE — Telephone Encounter (Signed)
Attempted to call patient several times. The first time, it sounded like someone picked up and hung up. The second, same. The third, it rang and sounded like a fax machine.  Will try again later.

## 2019-03-25 NOTE — Telephone Encounter (Signed)
I spoke to the patient who since Sunday, has experienced intermittent "chest pressure" and HTN.  His BP has ranged between [id number] Systolic with HR between [id number].  He denies any SOB or other symptoms.    I have scheduled an appt with Scott on 2/10 @ 3:15 pm, but instructed the patient to go to the ED if symptoms worsen.  He verbalized understanding.

## 2019-03-25 NOTE — Telephone Encounter (Signed)
Follow up     Pts daughter is calling to speak with the nurse    Please call

## 2019-03-26 ENCOUNTER — Other Ambulatory Visit: Payer: Self-pay

## 2019-03-26 ENCOUNTER — Encounter: Payer: Self-pay | Admitting: Physician Assistant

## 2019-03-26 ENCOUNTER — Ambulatory Visit (INDEPENDENT_AMBULATORY_CARE_PROVIDER_SITE_OTHER): Payer: Medicare Other | Admitting: Physician Assistant

## 2019-03-26 VITALS — BP 140/50 | HR 54 | Ht 68.0 in | Wt 181.8 lb

## 2019-03-26 DIAGNOSIS — I359 Nonrheumatic aortic valve disorder, unspecified: Secondary | ICD-10-CM

## 2019-03-26 DIAGNOSIS — Z952 Presence of prosthetic heart valve: Secondary | ICD-10-CM

## 2019-03-26 DIAGNOSIS — I5032 Chronic diastolic (congestive) heart failure: Secondary | ICD-10-CM | POA: Diagnosis not present

## 2019-03-26 DIAGNOSIS — I4821 Permanent atrial fibrillation: Secondary | ICD-10-CM | POA: Diagnosis not present

## 2019-03-26 DIAGNOSIS — I25119 Atherosclerotic heart disease of native coronary artery with unspecified angina pectoris: Secondary | ICD-10-CM | POA: Diagnosis not present

## 2019-03-26 DIAGNOSIS — I10 Essential (primary) hypertension: Secondary | ICD-10-CM

## 2019-03-26 MED ORDER — ISOSORBIDE MONONITRATE ER 60 MG PO TB24: 120.0000 mg | ORAL_TABLET | Freq: Every day | ORAL | 3 refills | Status: DC

## 2019-03-26 MED ORDER — FUROSEMIDE 80 MG PO TABS: 40.0000 mg | ORAL_TABLET | Freq: Two times a day (BID) | ORAL | 3 refills | Status: DC

## 2019-03-26 NOTE — Progress Notes (Signed)
Cardiology Office Note:    Date:  03/26/2019   ID:  Tyler Williams, DOB 07-31-1926, MRN [id number]  PCP:  Burnard Bunting, MD  Cardiologist:  Sherren Mocha, MD  Electrophysiologist:  None   Referring MD: Burnard Bunting, MD   Chief Complaint:  BP issues    Patient Profile:    Tyler Williams is a 86 y.o. male with:   Permanent atrial fibrillation  Coumadin anticoagulation  Coronary artery disease  S/p POBA in the 1980s, 1990s  Known high-grade stenosis of mid RCA; unsuccessful attempted PCI in past  Cath 7/19: Mid RCA, 85 >>unsuccessful PCI attempt to the RCA  Peripheral arterial disease  AAA s/p repair  Aortic stenosis  S/p TAVR 08/6224  Chronic diastolic CHF  Prior CV studies:  Cardiac catheterization 08/31/2017 LM mid 30 LAD normal LCx normal RCA mid 95, 85 LVEDP 12 PCI: Unsuccessful to the RCA  Echo 08/28/2017 Mild LVH, EF 55-60, inferolateral akinesis, bioprosthetic aortic valve with trivial AI, MAC, mild MR, severe LAE, moderate RAE, PASP 43  Echo 06/20/2017 EF 60-65, inferolateral akinesis, bioprosthetic aortic valve with mean gradient 7, MAC, mild MR, severe LAE, severe RAE, mild TR, PASP 53  24-hour Holter 02/01/2017 The basic rhythm is marked sinus bradycardia with average HR 48 bpm There are PVC's, but no sustained arrhythmia No atrial fibrillation noted No tachy-arrhythmias  Carotid US 12/04/2016 Bilateral ICA 1-39  Echo 07/05/2016 Moderate LVH, EF 60-65, normal wall motion, bioprosthetic aortic valve, mild MR, severe BAE, PASP 48  Echo 06/15/16 Mild LVH, EF 55-60, TAVR with trace perivalvular AI and peak gradient 16, mild MR, severe BAE, PASP 50  LHC 05/12/16 LAD mid 25 RI irregularities LCx mid 30 RCA proximal 40, mid 95, distal 40 Mean AV gradient 35, AVA 1.06 cm  Carotid US 10/17 R 40-59; L1-39>>follow-up 1 year    History of Present Illness:    Tyler Williams was last seen by Dr. Burt Knack in October 2020.   His volume status was improved at that time.  His amlodipine dose was reduced secondary to chronic leg swelling.  He called in recently with uncontrolled blood pressure and chest discomfort.  He was therefore scheduled for further evaluation.  He is here today with his daughter.  He brings in a list of his blood pressures.  He has several blood pressures in the mornings prior to his medicines that are 333L-456Y systolic.  He had 1 day last week where his pressure was in the 190s and he had some chest pressure associated with this.  Otherwise, he has not had any exertional chest symptoms.  He has not had significant shortness of breath.  He has not had orthopnea.  His leg swelling is improved.  He has not had syncope.  He does note significant volumes of urine after taking his furosemide in the morning.    Past Medical History:  Diagnosis Date  . A-fib ([place])   . AAA (abdominal aortic aneurysm) ([place])    s/p repair  . Aortic stenosis, mild    a. mean gradient 17 mmHg on 04/2012 TTE  . Arthritis    knees  . CAD (coronary artery disease)    a. s/p multiple percutaneous prior coronary artery interventions b. 05/2012: unsuccessful PCI to tight RCA->medically managed  . Cancer [facility])    skin cancer removed, right hand, left leg, chest  . Carotid artery disease ([place])    0-39% bilateral ICA stenoses 11/2011  . Chronic anticoagulation    Coumadin  .  Coronary artery disease   . GERD (gastroesophageal reflux disease)   . GERD (gastroesophageal reflux disease)   . History of pulmonary embolism    1964 related to dislocated hip on right   . History of skin cancer   . Hypercholesteremia   . Hypertension   . Incidental cecal mass noted on CT imaging 05/24/2016   **An incidental finding of potential clinical significance has been found. 4.6 x 5.6 x 3.7 cm masslike lesion in the cecum adjacent to the ileocecal valve may represent a large polyp or colonic neoplasm. Correlation with nonemergent colonoscopy is  strongly recommended in the near future to better evaluate this finding.**  . Macular degeneration    eye injections  . Microscopic hematuria    Followed by urology  . Permanent atrial fibrillation ([place])   . PVD (peripheral vascular disease) ([place])    Bilateral renal artery atherosclerosis, SMA stenosis with collateralization  . RBBB    Chronic  . 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 SSS (sick sinus syndrome) (HCC)     Current Medications: Current Meds  Medication Sig  . amLODipine (NORVASC) 5 MG tablet Take 1 tablet (5 mg total) by mouth daily.  Marland Kitchen aspirin EC 81 MG tablet Take 81 mg by mouth daily.  Marland Kitchen atorvastatin (LIPITOR) 20 MG tablet TAKE 1 TABLET ONCE DAILY.  . Calcium-Magnesium-Zinc (CAL-MAG-ZINC PO) Take 1 tablet by mouth daily.  . Cholecalciferol (VITAMIN D3) 5000 units TABS Take 1 tablet by mouth daily.  . Coenzyme Q10 (COQ10) 100 MG CAPS Take 1 tablet by mouth daily.  . fesoterodine (TOVIAZ) 8 MG TB24 tablet Take 8 mg by mouth every evening.  . furosemide (LASIX) 80 MG tablet Take 0.5 tablets (40 mg total) by mouth 2 (two) times daily.  . isosorbide mononitrate (IMDUR) 60 MG 24 hr tablet Take 2 tablets (120 mg total) by mouth daily.  Marland Kitchen LORazepam (ATIVAN) 1 MG tablet Take 1 mg by mouth at bedtime.  . metoprolol succinate (TOPROL-XL) 25 MG 24 hr tablet Take 1 tablet (25 mg total) by mouth daily.  . Multiple Vitamins-Minerals (PRESERVISION AREDS 2 PO) Take 1 tablet by mouth 2 (two) times daily.  . nitroGLYCERIN (NITROSTAT) 0.4 MG SL tablet Place 0.4 mg under the tongue every 5 (five) minutes as needed for chest pain.  Vladimir Faster Glycol-Propyl Glycol (SYSTANE OP) Place 2 drops into Williams eyes 2 (two) times daily.  . psyllium (METAMUCIL) 58.6 % packet Take by mouth.  . ramipril (ALTACE) 10 MG capsule Take 10 mg by mouth 2 (two) times daily.  .  ranibizumab (LUCENTIS) 0.5 MG/0.05ML SOLN 0.5 mg by Intravitreal route See admin instructions. Every 9 weeks  . warfarin (COUMADIN) 5 MG tablet Take 5-7.5 mg by mouth See admin instructions. Take 1 and 1/2 tablets on Monday then take 1 tablet all the other days  . [DISCONTINUED] furosemide (LASIX) 80 MG tablet Take 1 tablet (80 mg total) by mouth daily.  . [DISCONTINUED] isosorbide mononitrate (IMDUR) 60 MG 24 hr tablet TAKE 1&1/2 TABLETS ONCE DAILY.     Allergies:   No known allergies   Social History   Tobacco Use  . Smoking status: Never Smoker  . Smokeless tobacco: Never Used  Substance Use Topics  . Alcohol use: Not Currently    Comment: patient doesn't drink anymore  . Drug use: Never     Family Hx: The patient's family  history includes Heart attack (age of onset: 73) in his mother.  ROS   EKGs/Labs/Other Test Reviewed:    EKG:  EKG is  ordered today.  The ekg ordered today demonstrates sinus bradycardia, heart rate 54, right axis deviation, right bundle branch block, QTC 451, since prior tracing he is now in sinus rhythm  Recent Labs: 12/20/2018: BUN 31; Creatinine, Ser 1.29; Potassium 4.8; Sodium 137   Recent Lipid Panel Lab Results  Component Value Date/Time   CHOL 149 08/28/2017 03:50 AM   TRIG 95 08/28/2017 03:50 AM   HDL 52 08/28/2017 03:50 AM   CHOLHDL 2.9 08/28/2017 03:50 AM   LDLCALC 78 08/28/2017 03:50 AM    Physical Exam:    VS:  BP (!) 140/50   Pulse (!) 54   Ht _0  (1.727 m)   Wt 181 lb 12.8 oz (82.5 kg)   SpO2 96%   BMI 27.64 kg/m     Wt Readings from Last 3 Encounters:  03/26/19 181 lb 12.8 oz (82.5 kg)  01/14/19 187 lb (84.8 kg)  01/08/19 182 lb 12.8 oz (82.9 kg)     Constitutional:      Appearance: Healthy appearance. Not in distress.  Pulmonary:     Effort: Pulmonary effort is normal.     Breath sounds: No wheezing. No rales.  Cardiovascular:     Normal rate. Regular rhythm. Normal S1. Normal S2.     Murmurs: There is no murmur.   Edema:    Pretibial: bilateral trace edema of the pretibial area.    Ankle: trace edema of the right ankle. Abdominal:     Palpations: Abdomen is soft.  Musculoskeletal:     Cervical back: Neck supple. Skin:    General: Skin is warm and dry.  Neurological:     General: No focal deficit present.     Mental Status: Alert and oriented to person, place and time.     Cranial Nerves: Cranial nerves are intact.      ASSESSMENT & PLAN:    1. Essential hypertension Blood pressure uncontrolled.  He has several readings that are significantly elevated in the morning.  This does come down after taking his medications.  He has had recurrent angina with extremely elevated blood pressures.  He initially asked to come off of amlodipine.  However, I would prefer that he not stop his amlodipine due to his uncontrolled blood pressure.  I have recommended that we try to split of his blood pressure medications.  He can continue taking Ramipril twice a day.  I will increase his isosorbide to 120 mg daily.  He will take this in the morning.  He will continue metoprolol succinate the morning and change amlodipine to take in the evening.  He will continue to monitor his blood pressure and notify us if his readings remain uncontrolled.  2. Coronary artery disease involving native coronary artery of native heart with angina pectoris ([place]) History of multiple PCI procedures in the past.  He has known high-grade disease in the RCA and prior PCI attempt has been unsuccessful.  He is managed medically.  As noted, he did have recurrent angina with elevated blood pressures.  I have recommended that we increase his isosorbide mononitrate to 120 mg daily.  Continue current dose of amlodipine, metoprolol succinate.  Continue aspirin, atorvastatin.  3. Permanent atrial fibrillation ([place]) He is in sinus rhythm today.  He is tolerating anticoagulation.  Continue warfarin and follow-up with our Coumadin clinic.  4. Chronic  diastolic CHF (congestive heart failure) (HCC) Echocardiogram in 2019 demonstrated EF 55-60%.  NYHA II.  Volume status stable.  He is somewhat limited by frequent urination related to taking his loop diuretic.  I have suggested that he try taking Lasix 40 mg twice a day to see if this helps.  Obtain follow-up BMP today.  5. Aortic valve disease 6. S/P TAVR (transcatheter aortic valve replacement) Normally functioning AVR by echocardiogram in July 2019.  Continue SBE prophylaxis.   Dispo:  Return in about 2 months (around 05/24/2019) for Routine Follow Up, w/ Dr. Burt Knack, in person.   Medication Adjustments/Labs and Tests Ordered: Current medicines are reviewed at length with the patient today.  Concerns regarding medicines are outlined above.  Tests Ordered: Orders Placed This Encounter  Procedures  . Basic metabolic panel  . EKG 12-Lead   Medication Changes: Meds ordered this encounter  Medications  . furosemide (LASIX) 80 MG tablet    Sig: Take 0.5 tablets (40 mg total) by mouth 2 (two) times daily.    Dispense:  90 tablet    Refill:  3  . isosorbide mononitrate (IMDUR) 60 MG 24 hr tablet    Sig: Take 2 tablets (120 mg total) by mouth daily.    Dispense:  180 tablet    Refill:  3    Signed, [name], PA-C  03/26/2019 5:01 PM    [facility] [address], [place], [place]  [address] Phone: [phone]; Fax: [phone]

## 2019-03-26 NOTE — Patient Instructions (Signed)
Medication Instructions:   Your physician has recommended you make the following change in your medication:   1) Start taking your Amlodipine 5 mg at night 2) Split your Furosemide in half, take 0.5 tablet by mouth twice a day 3) Increase your Imdur to 120 mg (2 tablets) by mouth once a day  *If you need a refill on your cardiac medications before your next appointment, please call your pharmacy*  Lab Work:  You will have labs drawn today: BMET  If you have labs (blood work) drawn today and your tests are completely normal, you will receive your results only by: Marland Kitchen MyChart Message (if you have MyChart) OR . A paper copy in the mail If you have any lab test that is abnormal or we need to change your treatment, we will call you to review the results.  Testing/Procedures:  None ordered today  Follow-Up: At [facility], you and your health needs are our priority.  As part of our continuing mission to provide you with exceptional heart care, we have created designated Provider Care Teams.  These Care Teams include your primary Cardiologist (physician) and Advanced Practice Providers (APPs -  Physician Assistants and Nurse Practitioners) who all work together to provide you with the care you need, when you need it.  Your next appointment:   2 month(s)  The format for your next appointment:   In Person  Provider:   Sherren Mocha, MD

## 2019-03-27 LAB — BASIC METABOLIC PANEL
BUN/Creatinine Ratio: 25 — ABNORMAL HIGH (ref 10–24)
BUN: 31 mg/dL (ref 10–36)
CO2: 22 mmol/L (ref 20–29)
Calcium: 9.2 mg/dL (ref 8.6–10.2)
Chloride: 103 mmol/L (ref 96–106)
Creatinine, Ser: 1.24 mg/dL (ref 0.76–1.27)
GFR calc Af Amer: 58 mL/min/{1.73_m2} — ABNORMAL LOW (ref >59)
GFR calc non Af Amer: 51 mL/min/{1.73_m2} — ABNORMAL LOW (ref >59)
Glucose: 103 mg/dL — ABNORMAL HIGH (ref 65–99)
Potassium: 4.9 mmol/L (ref 3.5–5.2)
Sodium: 140 mmol/L (ref 134–144)

## 2019-04-07 DIAGNOSIS — J449 Chronic obstructive pulmonary disease, unspecified: Secondary | ICD-10-CM | POA: Diagnosis not present

## 2019-04-07 DIAGNOSIS — I509 Heart failure, unspecified: Secondary | ICD-10-CM | POA: Diagnosis not present

## 2019-04-07 DIAGNOSIS — I2510 Atherosclerotic heart disease of native coronary artery without angina pectoris: Secondary | ICD-10-CM | POA: Diagnosis not present

## 2019-04-07 DIAGNOSIS — I700 Atherosclerosis of aorta: Secondary | ICD-10-CM | POA: Diagnosis not present

## 2019-04-07 DIAGNOSIS — E785 Hyperlipidemia, unspecified: Secondary | ICD-10-CM | POA: Diagnosis not present

## 2019-04-07 DIAGNOSIS — I482 Chronic atrial fibrillation, unspecified: Secondary | ICD-10-CM | POA: Diagnosis not present

## 2019-04-07 DIAGNOSIS — I130 Hypertensive heart and chronic kidney disease with heart failure and stage 1 through stage 4 chronic kidney disease, or unspecified chronic kidney disease: Secondary | ICD-10-CM | POA: Diagnosis not present

## 2019-04-07 DIAGNOSIS — E871 Hypo-osmolality and hyponatremia: Secondary | ICD-10-CM | POA: Diagnosis not present

## 2019-04-07 DIAGNOSIS — N182 Chronic kidney disease, stage 2 (mild): Secondary | ICD-10-CM | POA: Diagnosis not present

## 2019-04-07 DIAGNOSIS — Z1331 Encounter for screening for depression: Secondary | ICD-10-CM | POA: Diagnosis not present

## 2019-04-07 DIAGNOSIS — Z952 Presence of prosthetic heart valve: Secondary | ICD-10-CM | POA: Diagnosis not present

## 2019-04-17 DIAGNOSIS — H353231 Exudative age-related macular degeneration, bilateral, with active choroidal neovascularization: Secondary | ICD-10-CM | POA: Diagnosis not present

## 2019-04-17 DIAGNOSIS — Z961 Presence of intraocular lens: Secondary | ICD-10-CM | POA: Diagnosis not present

## 2019-04-18 ENCOUNTER — Ambulatory Visit (INDEPENDENT_AMBULATORY_CARE_PROVIDER_SITE_OTHER): Payer: Medicare Other | Admitting: *Deleted

## 2019-04-18 ENCOUNTER — Other Ambulatory Visit: Payer: Self-pay

## 2019-04-18 DIAGNOSIS — I4821 Permanent atrial fibrillation: Secondary | ICD-10-CM

## 2019-04-18 DIAGNOSIS — Z5181 Encounter for therapeutic drug level monitoring: Secondary | ICD-10-CM

## 2019-04-18 DIAGNOSIS — I4891 Unspecified atrial fibrillation: Secondary | ICD-10-CM | POA: Diagnosis not present

## 2019-04-18 LAB — POCT INR: INR: 2.6 (ref 2.0–3.0)

## 2019-04-18 NOTE — Patient Instructions (Signed)
Description   Continue on same dosage 1 tablet every day except for 1.5 tablets on Mondays. Recheck INR in 6 weeks. Call if placed on any new medications or if scheduled for any procedures.  Coumadin clinic [phone].

## 2019-04-21 ENCOUNTER — Other Ambulatory Visit: Payer: Self-pay | Admitting: Cardiovascular Disease

## 2019-04-24 DIAGNOSIS — R0609 Other forms of dyspnea: Secondary | ICD-10-CM | POA: Diagnosis not present

## 2019-04-24 DIAGNOSIS — I482 Chronic atrial fibrillation, unspecified: Secondary | ICD-10-CM | POA: Diagnosis not present

## 2019-04-24 DIAGNOSIS — I509 Heart failure, unspecified: Secondary | ICD-10-CM | POA: Diagnosis not present

## 2019-04-24 DIAGNOSIS — I2510 Atherosclerotic heart disease of native coronary artery without angina pectoris: Secondary | ICD-10-CM | POA: Diagnosis not present

## 2019-04-24 DIAGNOSIS — K5900 Constipation, unspecified: Secondary | ICD-10-CM | POA: Diagnosis not present

## 2019-04-24 DIAGNOSIS — R0600 Dyspnea, unspecified: Secondary | ICD-10-CM | POA: Insufficient documentation

## 2019-04-24 DIAGNOSIS — J449 Chronic obstructive pulmonary disease, unspecified: Secondary | ICD-10-CM | POA: Diagnosis not present

## 2019-04-25 ENCOUNTER — Other Ambulatory Visit: Payer: Self-pay

## 2019-04-25 ENCOUNTER — Encounter: Payer: Self-pay | Admitting: Podiatry

## 2019-04-25 ENCOUNTER — Ambulatory Visit (INDEPENDENT_AMBULATORY_CARE_PROVIDER_SITE_OTHER): Payer: Medicare Other | Admitting: Podiatry

## 2019-04-25 VITALS — Temp 97.0°F

## 2019-04-25 DIAGNOSIS — B351 Tinea unguium: Secondary | ICD-10-CM | POA: Diagnosis not present

## 2019-04-25 DIAGNOSIS — M79674 Pain in right toe(s): Secondary | ICD-10-CM

## 2019-04-25 DIAGNOSIS — M79675 Pain in left toe(s): Secondary | ICD-10-CM

## 2019-04-25 DIAGNOSIS — Z9229 Personal history of other drug therapy: Secondary | ICD-10-CM

## 2019-04-25 NOTE — Patient Instructions (Signed)

## 2019-05-01 NOTE — Progress Notes (Signed)
Subjective: Tyler Williams presents today for follow up of painful mycotic nails b/l that are difficult to trim. Pain interferes with ambulation. Aggravating factors include wearing enclosed shoe gear. Pain is relieved with periodic professional debridement.  He remains on blood thinner, Coumadin.  Tyler Williams voices no new pedal concerns on today's visit.  Allergies  Allergen Reactions  . No Known Allergies Other (See Comments)    NKA     Objective: Vitals:   04/25/19 1429  Temp: (!) 39 F (36.1 C)    Pt 86 y.o. year old Caucasian male WD, WN in NAD. AAO x 3.   Vascular Examination:  Capillary fill time to digits <3 seconds b/l. Palpable DP pulses b/l. Palpable PT pulses b/l. Pedal hair absent b/l Skin temperature gradient within normal limits b/l.  Dermatological Examination: Pedal skin with normal turgor, texture and tone bilaterally. No open wounds bilaterally. No interdigital macerations bilaterally. Toenails 1-5 b/l elongated, dystrophic, thickened, crumbly with subungual debris and tenderness to dorsal palpation.  Musculoskeletal: Normal muscle strength 5/5 to all lower extremity muscle groups bilaterally, no gross bony deformities bilaterally and no pain crepitus or joint limitation noted with ROM b/l  Neurological: Protective sensation intact 5/5 intact bilaterally with 10g monofilament b/l Vibratory sensation intact b/l  Assessment: 1. Pain due to onychomycosis of toenails of both feet   2. Hx of long term use of blood thinners    Plan: -Toenails 1-5 b/l were debrided in length and girth with sterile nail nippers and dremel without iatrogenic bleeding.  -Patient to continue soft, supportive shoe gear daily. -Patient to report any pedal injuries to medical professional immediately. -Patient/POA to call should there be question/concern in the interim.  Return in about 10 weeks (around 07/04/2019) for nail trim/ Coumadin.

## 2019-05-30 ENCOUNTER — Ambulatory Visit (INDEPENDENT_AMBULATORY_CARE_PROVIDER_SITE_OTHER): Payer: Medicare Other | Admitting: *Deleted

## 2019-05-30 ENCOUNTER — Other Ambulatory Visit: Payer: Self-pay

## 2019-05-30 DIAGNOSIS — I4891 Unspecified atrial fibrillation: Secondary | ICD-10-CM | POA: Diagnosis not present

## 2019-05-30 DIAGNOSIS — Z5181 Encounter for therapeutic drug level monitoring: Secondary | ICD-10-CM

## 2019-05-30 DIAGNOSIS — I4821 Permanent atrial fibrillation: Secondary | ICD-10-CM

## 2019-05-30 LAB — POCT INR: INR: 2.8 (ref 2.0–3.0)

## 2019-05-30 NOTE — Patient Instructions (Signed)
Description   Continue on same dosage 1 tablet every day except for 1.5 tablets on Mondays. Recheck INR in 5 weeks with MD Appt. Call if placed on any new medications or if scheduled for any procedures.  Coumadin clinic [phone].

## 2019-06-05 DIAGNOSIS — D692 Other nonthrombocytopenic purpura: Secondary | ICD-10-CM | POA: Diagnosis not present

## 2019-06-05 DIAGNOSIS — D1801 Hemangioma of skin and subcutaneous tissue: Secondary | ICD-10-CM | POA: Diagnosis not present

## 2019-06-05 DIAGNOSIS — D225 Melanocytic nevi of trunk: Secondary | ICD-10-CM | POA: Diagnosis not present

## 2019-06-05 DIAGNOSIS — L814 Other melanin hyperpigmentation: Secondary | ICD-10-CM | POA: Diagnosis not present

## 2019-06-05 DIAGNOSIS — D0462 Carcinoma in situ of skin of left upper limb, including shoulder: Secondary | ICD-10-CM | POA: Diagnosis not present

## 2019-06-05 DIAGNOSIS — Z85828 Personal history of other malignant neoplasm of skin: Secondary | ICD-10-CM | POA: Diagnosis not present

## 2019-06-05 DIAGNOSIS — L570 Actinic keratosis: Secondary | ICD-10-CM | POA: Diagnosis not present

## 2019-06-05 DIAGNOSIS — L821 Other seborrheic keratosis: Secondary | ICD-10-CM | POA: Diagnosis not present

## 2019-06-05 DIAGNOSIS — D045 Carcinoma in situ of skin of trunk: Secondary | ICD-10-CM | POA: Diagnosis not present

## 2019-06-12 DIAGNOSIS — Z961 Presence of intraocular lens: Secondary | ICD-10-CM | POA: Diagnosis not present

## 2019-06-12 DIAGNOSIS — H353231 Exudative age-related macular degeneration, bilateral, with active choroidal neovascularization: Secondary | ICD-10-CM | POA: Diagnosis not present

## 2019-06-30 ENCOUNTER — Ambulatory Visit (INDEPENDENT_AMBULATORY_CARE_PROVIDER_SITE_OTHER): Payer: Medicare Other | Admitting: Cardiovascular Disease

## 2019-06-30 ENCOUNTER — Encounter: Payer: Self-pay | Admitting: Cardiovascular Disease

## 2019-06-30 ENCOUNTER — Ambulatory Visit (INDEPENDENT_AMBULATORY_CARE_PROVIDER_SITE_OTHER): Payer: Medicare Other | Admitting: *Deleted

## 2019-06-30 ENCOUNTER — Other Ambulatory Visit: Payer: Self-pay

## 2019-06-30 VITALS — BP 138/48 | HR 62 | Ht 68.0 in | Wt 185.4 lb

## 2019-06-30 DIAGNOSIS — I5032 Chronic diastolic (congestive) heart failure: Secondary | ICD-10-CM

## 2019-06-30 DIAGNOSIS — I359 Nonrheumatic aortic valve disorder, unspecified: Secondary | ICD-10-CM

## 2019-06-30 DIAGNOSIS — Z5181 Encounter for therapeutic drug level monitoring: Secondary | ICD-10-CM

## 2019-06-30 DIAGNOSIS — I4891 Unspecified atrial fibrillation: Secondary | ICD-10-CM

## 2019-06-30 DIAGNOSIS — I10 Essential (primary) hypertension: Secondary | ICD-10-CM | POA: Diagnosis not present

## 2019-06-30 DIAGNOSIS — I4821 Permanent atrial fibrillation: Secondary | ICD-10-CM

## 2019-06-30 DIAGNOSIS — I25119 Atherosclerotic heart disease of native coronary artery with unspecified angina pectoris: Secondary | ICD-10-CM | POA: Diagnosis not present

## 2019-06-30 LAB — POCT INR: INR: 3.1 — AB (ref 2.0–3.0)

## 2019-06-30 NOTE — Progress Notes (Signed)
Cardiology Office Note:    Date:  06/30/2019   ID:  [name], DOB Jun 09, 1926, MRN [id number]  PCP:  Burnard Bunting, MD  Cardiologist:  Sherren Mocha, MD  Electrophysiologist:  None   Referring MD: Burnard Bunting, MD   Chief Complaint  Patient presents with  . Coronary Artery Disease    History of Present Illness:    Keonne Copping is a 86 y.o. male with a hx of:  Permanent atrial fibrillation ? Coumadin anticoagulation  Coronary artery disease ? S/p POBA in the 1980s, 1990s ? Known high-grade stenosis of mid RCA; unsuccessful attempted PCI in past ? Cath 7/19: Mid RCA, 85 >>unsuccessful PCI attempt to the RCA  Peripheral arterial disease  AAA s/p repair  Aortic stenosis ? S/p TAVR [id number]  Chronic diastolic CHF  The patient is here with his daughter today.  He has been living with her but looks forward to returning back to his home.  He continues to have a lot of problems with vision loss.  He has had leg swelling somewhat refractory to compression stockings.  He states that his leg swelling is pretty good when he first wakes up in the morning, but as the day goes on it gets much worse.  He has increased his furosemide and this is helped with the swelling but he does not like the frequent urination that occurs when he takes it.  He denies chest pain or pressure.  Shortness of breath with activity is stable.  No orthopnea or PND.  Past Medical History:  Diagnosis Date  . A-fib ([place])   . AAA (abdominal aortic aneurysm) ([place])    s/p repair  . Aortic stenosis, mild    a. mean gradient 17 mmHg on 04/2012 TTE  . Arthritis    knees  . CAD (coronary artery disease)    a. s/p multiple percutaneous prior coronary artery interventions b. 05/2012: unsuccessful PCI to tight RCA->medically managed  . Cancer [facility])    skin cancer removed, right hand, left leg, chest  . Carotid artery disease ([place])    0-39% bilateral ICA stenoses 11/2011  . Chronic  anticoagulation    Coumadin  . Coronary artery disease   . GERD (gastroesophageal reflux disease)   . GERD (gastroesophageal reflux disease)   . History of pulmonary embolism    1964 related to dislocated hip on right   . History of skin cancer   . Hypercholesteremia   . Hypertension   . Incidental cecal mass noted on CT imaging 05/24/2016   **An incidental finding of potential clinical significance has been found. 4.6 x 5.6 x 3.7 cm masslike lesion in the cecum adjacent to the ileocecal valve may represent a large polyp or colonic neoplasm. Correlation with nonemergent colonoscopy is strongly recommended in the near future to better evaluate this finding.**  . Macular degeneration    eye injections  . Microscopic hematuria    Followed by urology  . Permanent atrial fibrillation ([place])   . PVD (peripheral vascular disease) ([place])    Bilateral renal artery atherosclerosis, SMA stenosis with collateralization  . RBBB    Chronic  . 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 SSS (sick sinus syndrome) [facility])     Past Surgical History:  Procedure Laterality Date  . ABDOMINAL AORTIC ANEURYSM REPAIR     1993   . CARDIAC CATHETERIZATION  05/24/2012  pD1 20%, oCFX 20%, mCFX 30%, ostial Int Br 30%, distal AV groove CFX 30%, pRCA 30-40%, mRCA 99%, dRCA 30%, PL branch small with diffuse 40%. Unable to pass wire past RCA lesion->medically managed  . CHOLECYSTECTOMY N/A 12/05/2012   Procedure: LAPAROSCOPIC CHOLECYSTECTOMY ;  Surgeon: Edward Jolly, MD;  Location: [place];  Service: General;  Laterality: N/A;  . CHOLECYSTECTOMY    . CORONARY ANGIOPLASTY  1980, 1990  . CORONARY BALLOON ANGIOPLASTY N/A 08/31/2017   Procedure: CORONARY BALLOON ANGIOPLASTY;  Surgeon: Martinique, Peter M, MD;  Location: [place] CV LAB;  Service: Cardiovascular;  Laterality: N/A;  . EYE SURGERY       hx of cataract surgery   . [place]  . HERNIA REPAIR     right inguinal hernia repair   . JOINT REPLACEMENT     left knee replacement, partial right  . LEFT HEART CATH AND CORONARY ANGIOGRAPHY N/A 08/31/2017   Procedure: LEFT HEART CATH AND CORONARY ANGIOGRAPHY;  Surgeon: Martinique, Peter M, MD;  Location: [place] CV LAB;  Service: Cardiovascular;  Laterality: N/A;  . LEFT HEART CATHETERIZATION WITH CORONARY ANGIOGRAM N/A 05/24/2012   Procedure: LEFT HEART CATHETERIZATION WITH CORONARY ANGIOGRAM;  Surgeon: Burnell Blanks, MD;  Location: [facility] CATH LAB;  Service: Cardiovascular;  Laterality: N/A;  . ORTHOPEDIC SURGERY    . OTHER SURGICAL HISTORY     right knee popliteal aneurysm surgery stent placed   . OTHER SURGICAL HISTORY    . PARTIAL KNEE ARTHROPLASTY  01/22/2012   Procedure: UNICOMPARTMENTAL KNEE;  Surgeon: Mauri Pole, MD;  Location: [facility];  Service: Orthopedics;  Laterality: Right;  . PERCUTANEOUS CORONARY INTERVENTION-BALLOON ONLY  05/24/2012   Procedure: PERCUTANEOUS CORONARY INTERVENTION-BALLOON ONLY;  Surgeon: Burnell Blanks, MD;  Location: [facility] CATH LAB;  Service: Cardiovascular;;  . RIGHT/LEFT HEART CATH AND CORONARY ANGIOGRAPHY N/A 05/12/2016   Procedure: Right/Left Heart Cath and Coronary Angiography;  Surgeon: Sherren Mocha, MD;  Location: [place] CV LAB;  Service: Cardiovascular;  Laterality: N/A;  . TEE WITHOUT CARDIOVERSION N/A 06/13/2016   Procedure: TRANSESOPHAGEAL ECHOCARDIOGRAM (TEE);  Surgeon: Sherren Mocha, MD;  Location: [place];  Service: Open Heart Surgery;  Laterality: N/A;  . TONSILLECTOMY    . TOTAL KNEE ARTHROPLASTY Left   . TRANSCATHETER AORTIC VALVE REPLACEMENT, TRANSFEMORAL N/A 06/13/2016   Procedure: TRANSCATHETER AORTIC VALVE REPLACEMENT, TRANSFEMORAL;  Surgeon: Sherren Mocha, MD;  Location: [place];  Service: Open Heart Surgery;  Laterality: N/A;    Current Medications: Current Meds  Medication Sig  . amLODipine  (NORVASC) 5 MG tablet Take 1 tablet (5 mg total) by mouth daily.  Marland Kitchen aspirin EC 81 MG tablet Take 81 mg by mouth daily.  Marland Kitchen atorvastatin (LIPITOR) 20 MG tablet TAKE 1 TABLET ONCE DAILY.  . Calcium-Magnesium-Zinc (CAL-MAG-ZINC PO) Take 1 tablet by mouth daily.  . Cholecalciferol (VITAMIN D3) 5000 units TABS Take 1 tablet by mouth daily.  . Coenzyme Q10 (COQ10) 100 MG CAPS Take 1 tablet by mouth daily.  . fesoterodine (TOVIAZ) 8 MG TB24 tablet Take 8 mg by mouth every evening.  . furosemide (LASIX) 80 MG tablet Take 0.5 tablets (40 mg total) by mouth 2 (two) times daily.  . isosorbide mononitrate (IMDUR) 60 MG 24 hr tablet Take 2 tablets (120 mg total) by mouth daily.  Marland Kitchen LORazepam (ATIVAN) 1 MG tablet Take 1 mg by mouth at bedtime.  . metoprolol succinate (TOPROL-XL) 25 MG 24 hr tablet Take 1 tablet (25 mg  total) by mouth daily.  . nitroGLYCERIN (NITROSTAT) 0.4 MG SL tablet Place 0.4 mg under the tongue every 5 (five) minutes as needed for chest pain.  Vladimir Faster Glycol-Propyl Glycol (SYSTANE OP) Place 2 drops into both eyes 2 (two) times daily.  . psyllium (METAMUCIL) 58.6 % packet Take by mouth.  . ramipril (ALTACE) 10 MG capsule Take 10 mg by mouth 2 (two) times daily.  . ranibizumab (LUCENTIS) 0.5 MG/0.05ML SOLN 0.5 mg by Intravitreal route See admin instructions. Every 9 weeks  . warfarin (COUMADIN) 5 MG tablet Take 5-7.5 mg by mouth See admin instructions. Take 1 and 1/2 tablets on Monday then take 1 tablet all the other days     Allergies:   No known allergies   Social History   Socioeconomic History  . Marital status: Widowed    Spouse name: Not on file  . Number of children: Not on file  . Years of education: Not on file  . Highest education level: Not on file  Occupational History  . Not on file  Tobacco Use  . Smoking status: Never Smoker  . Smokeless tobacco: Never Used  Substance and Sexual Activity  . Alcohol use: Not Currently    Comment: patient doesn't drink anymore    . Drug use: Never  . Sexual activity: Not on file  Other Topics Concern  . Not on file  Social History Narrative   ** Merged History Encounter **       [employer]; played football; score touchdown against [employer] Determinants of Health   Financial Resource Strain:   . Difficulty of Paying Living Expenses:   Food Insecurity:   . Worried About Charity fundraiser in the Last Year:   . Arboriculturist in the Last Year:   Transportation Needs:   . Film/video editor (Medical):   Marland Kitchen Lack of Transportation (Non-Medical):   Physical Activity:   . Days of Exercise per Week:   . Minutes of Exercise per Session:   Stress:   . Feeling of Stress :   Social Connections:   . Frequency of Communication with Friends and Family:   . Frequency of Social Gatherings with Friends and Family:   . Attends Religious Services:   . Active Member of Clubs or Organizations:   . Attends Archivist Meetings:   Marland Kitchen Marital Status:      Family History: The patient's family history includes Heart attack (age of onset: 78) in his mother.  ROS:   Please see the history of present illness.    Positive for gait instability.  All other systems reviewed and are negative.  EKGs/Labs/Other Studies Reviewed:    The following studies were reviewed today: Echo 08/28/2017: Study Conclusions   - Left ventricle: The cavity size was normal. Wall thickness was  increased in a pattern of mild LVH. Systolic function was normal.  The estimated ejection fraction was in the range of 55% to 60%.  There is akinesis of the basalinferolateral myocardium.  - Aortic valve: A bioprosthesis was present. There was trivial  regurgitation.  - Mitral valve: Calcified annulus. There was mild regurgitation.  - Left atrium: The atrium was severely dilated.  - Right atrium: The atrium was moderately dilated.  - Pulmonary arteries: Systolic pressure was mildly increased. PA  peak pressure: 43  mm Hg (S).   Impressions:   - Akinesis of the inferolateral wall with overall preserved LV  systolic function;  s/p AVR with trace AI; mild MR; biatrial  enlargement; trace TR with mild pulmonary hypertension.   EKG:  EKG is not ordered today.    Recent Labs: 03/26/2019: BUN 31; Creatinine, Ser 1.24; Potassium 4.9; Sodium 140  Recent Lipid Panel    Component Value Date/Time   CHOL 149 08/28/2017 0350   TRIG 95 08/28/2017 0350   HDL 52 08/28/2017 0350   CHOLHDL 2.9 08/28/2017 0350   VLDL 19 08/28/2017 0350   LDLCALC 78 08/28/2017 0350    Physical Exam:    VS:  BP (!) 138/48   Pulse 62   Ht 5\' 8"  (1.727 m)   Wt 185 lb 6.4 oz (84.1 kg)   SpO2 97%   BMI 28.19 kg/m     Wt Readings from Last 3 Encounters:  06/30/19 185 lb 6.4 oz (84.1 kg)  03/26/19 181 lb 12.8 oz (82.5 kg)  01/14/19 187 lb (84.8 kg)     GEN: Well nourished, well developed in no acute distress HEENT: Normal NECK: No JVD; No carotid bruits LYMPHATICS: No lymphadenopathy CARDIAC: irregular, no murmurs, rubs, gallops RESPIRATORY:  Clear to auscultation without rales, wheezing or rhonchi  ABDOMEN: Soft, non-tender, non-distended MUSCULOSKELETAL:  1+ bilateral leg edema, knee high compression socks in place; No deformity  SKIN: Warm and dry NEUROLOGIC:  Alert and oriented x 3 PSYCHIATRIC:  Normal affect   ASSESSMENT:    1. Permanent atrial fibrillation ([place])   2. Coronary artery disease involving native coronary artery of native heart with angina pectoris ([place])   3. Aortic valve disease   4. Chronic diastolic CHF (congestive heart failure) ([place])   5. Essential hypertension    PLAN:    In order of problems listed above:  1. Tolerating oral anticoagulation with warfarin without bleeding problems.  Heart rate is controlled. 2. Overall appears stable.  When he was seen last, isosorbide was doubled because of hypertension and increased angina.  Seems to be better at present without angina as a  prominent complaint today.  Continue amlodipine, isosorbide, and metoprolol succinate. 3. The patient is status post TAVR.  He has had normal transcatheter heart valve function on serial imaging. 4. Overall appears stable.  I suspect his leg swelling is multifactorial with a prominent component of venous insufficiency.  He will continue with sodium restriction, compression socks, and elevation.  Continue current diuretic therapy. 5. We discussed the idea of discontinuing amlodipine as it could be exacerbating his leg swelling.  However, he is on multi drug therapy with a loop diuretic, ACE inhibitor, beta-blocker, long-acting nitrate, and calcium channel blocker.  He was not able to tolerate spironolactone.  Options are fairly limited.  I talked about stopping amlodipine and starting him on hydralazine but he was not interested in a 3 time per day drug.  I do not think clonidine is a good idea in someone his age on so many medications.  We will continue his current management for now.  Will arrange follow-up in 3 months.   Medication Adjustments/Labs and Tests Ordered: Current medicines are reviewed at length with the patient today.  Concerns regarding medicines are outlined above.  No orders of the defined types were placed in this encounter.  No orders of the defined types were placed in this encounter.   Patient Instructions  Medication Instructions:  Your provider recommends that you continue on your current medications as directed. Please refer to the Current Medication list given to you today.   *If you need a refill  on your cardiac medications before your next appointment, please call your pharmacy*  Follow-Up: Your provider recommends that you schedule a follow-up appointment in 3 months with [name].    Signed, Sherren Mocha, MD  06/30/2019 4:45 PM    [facility]

## 2019-06-30 NOTE — Patient Instructions (Signed)
Description    Take 1/2 a tablet today and then continue on same dosage 1 tablet every day except for 1.5 tablets on Mondays. Recheck INR in 5 weeks. Call if placed on any new medications or if scheduled for any procedures.  Coumadin clinic [phone].

## 2019-06-30 NOTE — Patient Instructions (Signed)
Medication Instructions:  Your provider recommends that you continue on your current medications as directed. Please refer to the Current Medication list given to you today.   *If you need a refill on your cardiac medications before your next appointment, please call your pharmacy*  Follow-Up: Your provider recommends that you schedule a follow-up appointment in 3 months with Scott Weaver. 

## 2019-07-07 DIAGNOSIS — E663 Overweight: Secondary | ICD-10-CM | POA: Diagnosis not present

## 2019-07-07 DIAGNOSIS — I10 Essential (primary) hypertension: Secondary | ICD-10-CM | POA: Diagnosis not present

## 2019-07-07 DIAGNOSIS — R0609 Other forms of dyspnea: Secondary | ICD-10-CM | POA: Diagnosis not present

## 2019-07-07 DIAGNOSIS — J449 Chronic obstructive pulmonary disease, unspecified: Secondary | ICD-10-CM | POA: Diagnosis not present

## 2019-07-07 DIAGNOSIS — I482 Chronic atrial fibrillation, unspecified: Secondary | ICD-10-CM | POA: Diagnosis not present

## 2019-07-07 DIAGNOSIS — K4090 Unilateral inguinal hernia, without obstruction or gangrene, not specified as recurrent: Secondary | ICD-10-CM | POA: Diagnosis not present

## 2019-07-07 DIAGNOSIS — Z7901 Long term (current) use of anticoagulants: Secondary | ICD-10-CM | POA: Diagnosis not present

## 2019-07-11 ENCOUNTER — Other Ambulatory Visit: Payer: Self-pay

## 2019-07-11 ENCOUNTER — Encounter: Payer: Self-pay | Admitting: Podiatry

## 2019-07-11 ENCOUNTER — Ambulatory Visit (INDEPENDENT_AMBULATORY_CARE_PROVIDER_SITE_OTHER): Payer: Medicare Other | Admitting: Podiatry

## 2019-07-11 DIAGNOSIS — M79675 Pain in left toe(s): Secondary | ICD-10-CM

## 2019-07-11 DIAGNOSIS — M79674 Pain in right toe(s): Secondary | ICD-10-CM

## 2019-07-11 DIAGNOSIS — B351 Tinea unguium: Secondary | ICD-10-CM | POA: Diagnosis not present

## 2019-07-18 NOTE — Progress Notes (Signed)
Subjective: Tyler Williams is a 86 y.o. male patient seen today painful mycotic nails b/l that are difficult to trim. Pain interferes with ambulation. Aggravating factors include wearing enclosed shoe gear. Pain is relieved with periodic professional debridement.   Tyler Williams voices no new pedal problems on today's visit.  Patient Active Problem List   Diagnosis Date Noted  . Aorta aneurysm ([place]) 02/14/2018  . Chronic diastolic CHF (congestive heart failure) ([place]) 09/25/2017  . Precordial chest pain 08/28/2017  . HLD (hyperlipidemia) 08/28/2017  . Anticoagulated on Coumadin 08/28/2017  . HTN (hypertension) 08/28/2017  . S/P TAVR (transcatheter aortic valve replacement)   . 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 Incidental cecal mass noted on CT imaging 05/24/2016  . 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 Encounter for therapeutic drug monitoring 03/20/2013  . Pseudophakia of both eyes 03/19/2013  . Macular degeneration 03/19/2013  . Cholecystitis, acute 12/06/2012  . Chest pain, atypical 12/03/2012  . Hematuria, microscopic 12/03/2012  . Cystoid macular edema 05/14/2012  . Overweight (BMI 25.0-29.9) 01/23/2012  . Hyponatremia 01/23/2012  . S/P right UKR 01/22/2012  . Carotid artery bruit 11/21/2011  . Carotid bruit 11/21/2011  . Presence of intraocular lens 02/16/2011  . EDEMA 07/14/2009  . Essential hypertension 01/27/2009  . HYPERCHOLESTEROLEMIA  IIA 10/21/2008  . Coronary artery disease involving native coronary artery with angina pectoris ([place]) 10/21/2008  . Permanent atrial fibrillation ([place]) 10/21/2008    Current Outpatient Medications on File Prior to Visit  Medication Sig Dispense Refill  . amLODipine (NORVASC) 5 MG tablet Take 1 tablet (5 mg total) by mouth daily. 90 tablet 3  . aspirin EC 81 MG tablet Take 81 mg by mouth daily.    Marland Kitchen atorvastatin (LIPITOR) 20  MG tablet TAKE 1 TABLET ONCE DAILY. 90 tablet 3  . Calcium-Magnesium-Zinc (CAL-MAG-ZINC PO) Take 1 tablet by mouth daily.    . Cholecalciferol (VITAMIN D3) 5000 units TABS Take 1 tablet by mouth daily.    . Coenzyme Q10 (COQ10) 100 MG CAPS Take 1 tablet by mouth daily.    . fesoterodine (TOVIAZ) 8 MG TB24 tablet Take 8 mg by mouth every evening.    . furosemide (LASIX) 80 MG tablet Take 0.5 tablets (40 mg total) by mouth 2 (two) times daily. 90 tablet 3  . isosorbide mononitrate (IMDUR) 60 MG 24 hr tablet Take 2 tablets (120 mg total) by mouth daily. 180 tablet 3  . LORazepam (ATIVAN) 1 MG tablet Take 1 mg by mouth at bedtime.    . metoprolol succinate (TOPROL-XL) 25 MG 24 hr tablet Take 1 tablet (25 mg total) by mouth daily. 90 tablet 2  . nitroGLYCERIN (NITROSTAT) 0.4 MG SL tablet Place 0.4 mg under the tongue every 5 (five) minutes as needed for chest pain.    Tyler Williams Glycol-Propyl Glycol (SYSTANE OP) Place 2 drops into both eyes 2 (two) times daily.    . psyllium (METAMUCIL) 58.6 % packet Take by mouth.    . ramipril (ALTACE) 10 MG capsule Take 10 mg by mouth 2 (two) times daily.    . ranibizumab (LUCENTIS) 0.5 MG/0.05ML SOLN 0.5 mg by Intravitreal route See admin instructions. Every 9 weeks    . warfarin (COUMADIN) 5 MG tablet Take 5-7.5 mg by mouth See admin instructions. Take 1 and 1/2 tablets on Monday then take 1 tablet all the other days     No current facility-administered medications on file prior to  visit.    Allergies  Allergen Reactions  . No Known Allergies Other (See Comments)    NKA    Objective: Physical Exam  General: Tyler Williams is a pleasant 86 y.o. African American male, in NAD. AAO x 3.   Vascular:  Neurovascular status unchanged b/l lower extremities. Capillary fill time to digits <3 seconds b/l lower extremities. Palpable DP pulses b/l. Palpable PT pulses b/l. Pedal hair absent b/l. Skin temperature gradient within normal limits  b/l.  Dermatological:  Pedal skin with normal turgor, texture and tone bilaterally. No open wounds bilaterally. No interdigital macerations bilaterally. Toenails 1-5 b/l elongated, discolored, dystrophic, thickened, crumbly with subungual debris and tenderness to dorsal palpation.  Musculoskeletal:  Normal muscle strength 5/5 to all lower extremity muscle groups bilaterally. No pain crepitus or joint limitation noted with ROM b/l. No gross bony deformities bilaterally.  Neurological:  Protective sensation intact 5/5 intact bilaterally with 10g monofilament b/l. Vibratory sensation intact b/l. Proprioception intact bilaterally.  Assessment and Plan:  1. Pain due to onychomycosis of toenails of both feet    -Examined patient. -No new findings. No new orders. -Toenails 1-5 b/l were debrided in length and girth with sterile nail nippers and dremel without iatrogenic bleeding.  -Patient to continue soft, supportive shoe gear daily. -Patient to report any pedal injuries to medical professional immediately. -Patient/POA to call should there be question/concern in the interim.  Return in about 10 weeks (around 09/19/2019) for nail trim/ Coumadin.  Marzetta Board, DPM

## 2019-07-24 DIAGNOSIS — H353231 Exudative age-related macular degeneration, bilateral, with active choroidal neovascularization: Secondary | ICD-10-CM | POA: Diagnosis not present

## 2019-07-29 DIAGNOSIS — N182 Chronic kidney disease, stage 2 (mild): Secondary | ICD-10-CM | POA: Diagnosis not present

## 2019-07-29 DIAGNOSIS — I130 Hypertensive heart and chronic kidney disease with heart failure and stage 1 through stage 4 chronic kidney disease, or unspecified chronic kidney disease: Secondary | ICD-10-CM | POA: Diagnosis not present

## 2019-07-29 DIAGNOSIS — Z952 Presence of prosthetic heart valve: Secondary | ICD-10-CM | POA: Diagnosis not present

## 2019-07-29 DIAGNOSIS — I482 Chronic atrial fibrillation, unspecified: Secondary | ICD-10-CM | POA: Diagnosis not present

## 2019-07-29 DIAGNOSIS — K5900 Constipation, unspecified: Secondary | ICD-10-CM | POA: Diagnosis not present

## 2019-07-29 DIAGNOSIS — R609 Edema, unspecified: Secondary | ICD-10-CM | POA: Diagnosis not present

## 2019-07-29 DIAGNOSIS — I509 Heart failure, unspecified: Secondary | ICD-10-CM | POA: Diagnosis not present

## 2019-08-01 ENCOUNTER — Ambulatory Visit (INDEPENDENT_AMBULATORY_CARE_PROVIDER_SITE_OTHER): Payer: Medicare Other | Admitting: *Deleted

## 2019-08-01 ENCOUNTER — Other Ambulatory Visit: Payer: Self-pay

## 2019-08-01 DIAGNOSIS — I4891 Unspecified atrial fibrillation: Secondary | ICD-10-CM | POA: Diagnosis not present

## 2019-08-01 DIAGNOSIS — Z5181 Encounter for therapeutic drug level monitoring: Secondary | ICD-10-CM

## 2019-08-01 LAB — POCT INR: INR: 3.3 — AB (ref 2.0–3.0)

## 2019-08-01 NOTE — Patient Instructions (Addendum)
Description   Hold warfarin today and then start taking taking 1 tablet daily.  Recheck INR in 3 weeks. Call if placed on any new medications or if scheduled for any procedures.  Coumadin clinic [phone].

## 2019-08-22 ENCOUNTER — Ambulatory Visit (INDEPENDENT_AMBULATORY_CARE_PROVIDER_SITE_OTHER): Payer: Medicare Other

## 2019-08-22 ENCOUNTER — Other Ambulatory Visit: Payer: Self-pay

## 2019-08-22 DIAGNOSIS — I4891 Unspecified atrial fibrillation: Secondary | ICD-10-CM

## 2019-08-22 DIAGNOSIS — Z5181 Encounter for therapeutic drug level monitoring: Secondary | ICD-10-CM

## 2019-08-22 DIAGNOSIS — I4821 Permanent atrial fibrillation: Secondary | ICD-10-CM

## 2019-08-22 LAB — POCT INR: INR: 2.3 (ref 2.0–3.0)

## 2019-08-22 NOTE — Patient Instructions (Signed)
Continue taking 1 tablet daily.  Recheck INR in 4 weeks. Call if placed on any new medications or if scheduled for any procedures.  Coumadin clinic [phone].

## 2019-09-10 DIAGNOSIS — H9192 Unspecified hearing loss, left ear: Secondary | ICD-10-CM | POA: Diagnosis not present

## 2019-09-10 DIAGNOSIS — R2232 Localized swelling, mass and lump, left upper limb: Secondary | ICD-10-CM | POA: Diagnosis not present

## 2019-09-10 DIAGNOSIS — H6122 Impacted cerumen, left ear: Secondary | ICD-10-CM | POA: Diagnosis not present

## 2019-09-10 DIAGNOSIS — M79642 Pain in left hand: Secondary | ICD-10-CM | POA: Diagnosis not present

## 2019-09-11 DIAGNOSIS — H353231 Exudative age-related macular degeneration, bilateral, with active choroidal neovascularization: Secondary | ICD-10-CM | POA: Diagnosis not present

## 2019-09-11 DIAGNOSIS — Z961 Presence of intraocular lens: Secondary | ICD-10-CM | POA: Diagnosis not present

## 2019-09-17 DIAGNOSIS — Z7901 Long term (current) use of anticoagulants: Secondary | ICD-10-CM | POA: Diagnosis not present

## 2019-09-17 DIAGNOSIS — W57XXXA Bitten or stung by nonvenomous insect and other nonvenomous arthropods, initial encounter: Secondary | ICD-10-CM | POA: Diagnosis not present

## 2019-09-17 DIAGNOSIS — I482 Chronic atrial fibrillation, unspecified: Secondary | ICD-10-CM | POA: Diagnosis not present

## 2019-09-17 DIAGNOSIS — S60561A Insect bite (nonvenomous) of right hand, initial encounter: Secondary | ICD-10-CM | POA: Diagnosis not present

## 2019-09-19 ENCOUNTER — Other Ambulatory Visit: Payer: Self-pay

## 2019-09-19 ENCOUNTER — Encounter: Payer: Self-pay | Admitting: Podiatry

## 2019-09-19 ENCOUNTER — Telehealth: Payer: Self-pay | Admitting: Pharmacist

## 2019-09-19 ENCOUNTER — Ambulatory Visit (INDEPENDENT_AMBULATORY_CARE_PROVIDER_SITE_OTHER): Payer: Medicare Other | Admitting: Podiatry

## 2019-09-19 DIAGNOSIS — M79675 Pain in left toe(s): Secondary | ICD-10-CM | POA: Diagnosis not present

## 2019-09-19 DIAGNOSIS — B351 Tinea unguium: Secondary | ICD-10-CM

## 2019-09-19 DIAGNOSIS — M79674 Pain in right toe(s): Secondary | ICD-10-CM | POA: Diagnosis not present

## 2019-09-19 NOTE — Telephone Encounter (Signed)
Called pt and left message. He missed INR check this AM and will need appt rescheduled. 

## 2019-09-20 NOTE — Progress Notes (Signed)
Subjective: Tyler Williams is a 86 y.o. male patient seen today painful mycotic nails b/l that are difficult to trim. Pain interferes with ambulation. Aggravating factors include wearing enclosed shoe gear. Pain is relieved with periodic professional debridement.   He continues to have vision problems and no longer drives. A friend has brought him to today's appointment. [name] voices no new pedal problems on today's visit.  Patient Active Problem List   Diagnosis Date Noted  . Aorta aneurysm ([place]) 02/14/2018  . Chronic diastolic CHF (congestive heart failure) ([place]) 09/25/2017  . Precordial chest pain 08/28/2017  . HLD (hyperlipidemia) 08/28/2017  . Anticoagulated on Coumadin 08/28/2017  . HTN (hypertension) 08/28/2017  . S/P TAVR (transcatheter aortic valve replacement)   . 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 Incidental cecal mass noted on CT imaging 05/24/2016  . Exudative age-related macular 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 ([place]) 12/22/2014  . Encounter for therapeutic drug monitoring 03/20/2013  . Pseudophakia of both eyes 03/19/2013  . Macular degeneration 03/19/2013  . Cholecystitis, acute 12/06/2012  . Chest pain, atypical 12/03/2012  . Hematuria, microscopic 12/03/2012  . Cystoid macular edema 05/14/2012  . Overweight (BMI 25.0-29.9) 01/23/2012  . Hyponatremia 01/23/2012  . S/P right UKR 01/22/2012  . Carotid artery bruit 11/21/2011  . Carotid bruit 11/21/2011  . Presence of intraocular lens 02/16/2011  . EDEMA 07/14/2009  . Essential hypertension 01/27/2009  . HYPERCHOLESTEROLEMIA  IIA 10/21/2008  . Coronary artery disease involving native coronary artery with angina pectoris ([place]) 10/21/2008  . Permanent atrial fibrillation ([place]) 10/21/2008    Current Outpatient Medications on File Prior to Visit  Medication Sig Dispense Refill  . amLODipine (NORVASC) 5 MG tablet Take 1 tablet (5 mg total) by  mouth daily. 90 tablet 3  . aspirin EC 81 MG tablet Take 81 mg by mouth daily.    Marland Kitchen atorvastatin (LIPITOR) 20 MG tablet TAKE 1 TABLET ONCE DAILY. 90 tablet 3  . Calcium-Magnesium-Zinc (CAL-MAG-ZINC PO) Take 1 tablet by mouth daily.    . cephALEXin (KEFLEX) 250 MG capsule Take by mouth.    . Cholecalciferol (VITAMIN D3) 5000 units TABS Take 1 tablet by mouth daily.    . Coenzyme Q10 (COQ10) 100 MG CAPS Take 1 tablet by mouth daily.    . fesoterodine (TOVIAZ) 8 MG TB24 tablet Take 8 mg by mouth every evening.    . furosemide (LASIX) 80 MG tablet Take 0.5 tablets (40 mg total) by mouth 2 (two) times daily. 90 tablet 3  . isosorbide mononitrate (IMDUR) 60 MG 24 hr tablet Take 2 tablets (120 mg total) by mouth daily. 180 tablet 3  . LORazepam (ATIVAN) 1 MG tablet Take 1 mg by mouth at bedtime.    . metoprolol succinate (TOPROL-XL) 25 MG 24 hr tablet Take 1 tablet (25 mg total) by mouth daily. 90 tablet 2  . neomycin-polymyxin-hydrocortisone (CORTISPORIN) 3.5-10000-1 OTIC suspension     . nitroGLYCERIN (NITROSTAT) 0.4 MG SL tablet Place 0.4 mg under the tongue every 5 (five) minutes as needed for chest pain.    Vladimir Faster Glycol-Propyl Glycol (SYSTANE OP) Place 2 drops into both eyes 2 (two) times daily.    . psyllium (METAMUCIL) 58.6 % packet Take by mouth.    . ramipril (ALTACE) 10 MG capsule Take 10 mg by mouth 2 (two) times daily.    . ranibizumab (LUCENTIS) 0.5 MG/0.05ML SOLN 0.5 mg by Intravitreal route See admin instructions. Every 9 weeks    .  warfarin (COUMADIN) 5 MG tablet Take 5-7.5 mg by mouth See admin instructions. Take 1 and 1/2 tablets on Monday then take 1 tablet all the other days     No current facility-administered medications on file prior to visit.    Allergies  Allergen Reactions  . No Known Allergies Other (See Comments)    NKA    Objective: Physical Exam  General: [name] is a pleasant 86 y.o. African American male, in NAD. AAO x 3.   Vascular:   Neurovascular status unchanged b/l lower extremities. Capillary fill time to digits <3 seconds b/l lower extremities. Palpable DP pulses b/l. Palpable PT pulses b/l. Pedal hair absent b/l. Skin temperature gradient within normal limits b/l.  Dermatological:  Pedal skin with normal turgor, texture and tone bilaterally. No open wounds bilaterally. No interdigital macerations bilaterally. Toenails 1-5 b/l elongated, discolored, dystrophic, thickened, crumbly with subungual debris and tenderness to dorsal palpation.  Musculoskeletal:  Normal muscle strength 5/5 to all lower extremity muscle groups bilaterally. No pain crepitus or joint limitation noted with ROM b/l. No gross bony deformities bilaterally.  Neurological:  Protective sensation intact 5/5 intact bilaterally with 10g monofilament b/l. Vibratory sensation intact b/l. Proprioception intact bilaterally.  Assessment and Plan:   1. Pain due to onychomycosis of toenails of both feet    -Examined patient. -No new findings. No new orders. -Toenails 1-5 b/l were debrided in length and girth with sterile nail nippers and dremel without iatrogenic bleeding.  -Patient to continue soft, supportive shoe gear daily. -Patient to report any pedal injuries to medical professional immediately. -Patient/POA to call should there be question/concern in the interim.  Return in about 9 weeks (around 11/21/2019) for nail trim.  Marzetta Board, DPM

## 2019-09-23 ENCOUNTER — Other Ambulatory Visit: Payer: Self-pay

## 2019-09-23 ENCOUNTER — Ambulatory Visit (INDEPENDENT_AMBULATORY_CARE_PROVIDER_SITE_OTHER): Payer: Medicare Other

## 2019-09-23 DIAGNOSIS — Z5181 Encounter for therapeutic drug level monitoring: Secondary | ICD-10-CM | POA: Diagnosis not present

## 2019-09-23 DIAGNOSIS — I4891 Unspecified atrial fibrillation: Secondary | ICD-10-CM | POA: Diagnosis not present

## 2019-09-23 DIAGNOSIS — I4821 Permanent atrial fibrillation: Secondary | ICD-10-CM | POA: Diagnosis not present

## 2019-09-23 LAB — POCT INR: INR: 3.1 — AB (ref 2.0–3.0)

## 2019-09-23 NOTE — Patient Instructions (Signed)
Description   Take 1/2 tablet today, then resume same dosage 1 tablet daily.  Recheck INR in 4 weeks. Call if placed on any new medications or if scheduled for any procedures.  Coumadin clinic [phone].

## 2019-09-26 ENCOUNTER — Telehealth (HOSPITAL_COMMUNITY): Payer: Self-pay | Admitting: Internal Medicine

## 2019-09-29 ENCOUNTER — Telehealth (HOSPITAL_COMMUNITY): Payer: Self-pay | Admitting: *Deleted

## 2019-09-29 NOTE — Telephone Encounter (Signed)
Returned patient's call regarding the referral to the cardiac rehab maintenance program. Patient is interested in starting the maintenance program for his exercise. Will send referral to Dr. Burt Knack, patient's cardiologist. Will schedule patient once referral is received.  Sol Passer, MS, ACSM CEP 09/29/2019 1141

## 2019-10-03 DIAGNOSIS — R609 Edema, unspecified: Secondary | ICD-10-CM | POA: Diagnosis not present

## 2019-10-03 DIAGNOSIS — I509 Heart failure, unspecified: Secondary | ICD-10-CM | POA: Diagnosis not present

## 2019-10-03 DIAGNOSIS — J449 Chronic obstructive pulmonary disease, unspecified: Secondary | ICD-10-CM | POA: Diagnosis not present

## 2019-10-03 DIAGNOSIS — N182 Chronic kidney disease, stage 2 (mild): Secondary | ICD-10-CM | POA: Diagnosis not present

## 2019-10-03 DIAGNOSIS — I130 Hypertensive heart and chronic kidney disease with heart failure and stage 1 through stage 4 chronic kidney disease, or unspecified chronic kidney disease: Secondary | ICD-10-CM | POA: Diagnosis not present

## 2019-10-03 DIAGNOSIS — I482 Chronic atrial fibrillation, unspecified: Secondary | ICD-10-CM | POA: Diagnosis not present

## 2019-10-03 DIAGNOSIS — I739 Peripheral vascular disease, unspecified: Secondary | ICD-10-CM | POA: Diagnosis not present

## 2019-10-03 DIAGNOSIS — H9191 Unspecified hearing loss, right ear: Secondary | ICD-10-CM | POA: Diagnosis not present

## 2019-10-03 DIAGNOSIS — H6121 Impacted cerumen, right ear: Secondary | ICD-10-CM | POA: Diagnosis not present

## 2019-10-07 ENCOUNTER — Ambulatory Visit (INDEPENDENT_AMBULATORY_CARE_PROVIDER_SITE_OTHER): Payer: Medicare Other | Admitting: Physician Assistant

## 2019-10-07 ENCOUNTER — Other Ambulatory Visit: Payer: Self-pay

## 2019-10-07 ENCOUNTER — Encounter: Payer: Self-pay | Admitting: Physician Assistant

## 2019-10-07 VITALS — BP 130/42 | HR 62 | Ht 69.0 in | Wt 180.0 lb

## 2019-10-07 DIAGNOSIS — I5032 Chronic diastolic (congestive) heart failure: Secondary | ICD-10-CM

## 2019-10-07 DIAGNOSIS — M7989 Other specified soft tissue disorders: Secondary | ICD-10-CM

## 2019-10-07 DIAGNOSIS — I359 Nonrheumatic aortic valve disorder, unspecified: Secondary | ICD-10-CM

## 2019-10-07 DIAGNOSIS — Z952 Presence of prosthetic heart valve: Secondary | ICD-10-CM | POA: Diagnosis not present

## 2019-10-07 DIAGNOSIS — I10 Essential (primary) hypertension: Secondary | ICD-10-CM | POA: Diagnosis not present

## 2019-10-07 DIAGNOSIS — I25119 Atherosclerotic heart disease of native coronary artery with unspecified angina pectoris: Secondary | ICD-10-CM

## 2019-10-07 DIAGNOSIS — I4821 Permanent atrial fibrillation: Secondary | ICD-10-CM

## 2019-10-07 NOTE — Patient Instructions (Addendum)
Medication Instructions:  Your physician recommends that you continue on your current medications as directed. Please refer to the Current Medication list given to you today.  *If you need a refill on your cardiac medications before your next appointment, please call your pharmacy*  Lab Work: None ordered today  Testing/Procedures: None ordered today  Follow-Up: On 04/12/20 at 2:20PM with Sherren Mocha, MD

## 2019-10-07 NOTE — Addendum Note (Signed)
Addended byKathlen Mody, Nicki Reaper T on: 10/07/2019 05:03 PM   Modules accepted: Orders

## 2019-10-07 NOTE — Progress Notes (Signed)
Cardiology Office Note:    Date:  10/07/2019   ID:  Tyler Williams, DOB [birth year], MRN [id number]  PCP:  Tyler Bunting, MD  Cardiologist:  Tyler Mocha, MD   Electrophysiologist:  None   Referring MD: Tyler Bunting, MD   Chief Complaint:  Follow-up (CAD, CHF, AFib, leg swelling)    Patient Profile:    Tyler Williams is a 86 y.o. male with:   Permanent atrial fibrillation ? Coumadin anticoagulation  Coronary artery disease ? S/p POBA in the 1980s, 1990s ? Known high-grade stenosis of mid RCA; unsuccessful attempted PCI in past ? Cath 7/19: Mid RCA, 85 >>unsuccessful PCI attempt to the RCA  Peripheral arterial disease  AAA s/p repair  Aortic stenosis ? S/p TAVR 04/1515  Chronic diastolic CHF  Venous insufficiency   Prior CV studies:  Cardiac catheterization 08/31/2017 LM mid 30 LAD normal LCx normal RCA mid 95, 85 LVEDP 12 PCI: Unsuccessful to the RCA  Echo 08/28/2017 Mild LVH, EF 55-60, inferolateral akinesis, bioprosthetic aortic valve with trivial AI, MAC, mild MR, severe LAE, moderate RAE, PASP 43  Echo 06/20/2017 EF 60-65, inferolateral akinesis, bioprosthetic aortic valve with mean gradient 7, MAC, mild MR, severe LAE, severe RAE, mild TR, PASP 53  24-hour Holter 02/01/2017 The basic rhythm is marked sinus bradycardia with average HR 48 bpm There are PVC's, but no sustained arrhythmia No atrial fibrillation noted No tachy-arrhythmias  Carotid US 12/04/2016 Bilateral ICA 1-39  Echo 07/05/2016 Moderate LVH, EF 60-65, normal wall motion, bioprosthetic aortic valve, mild MR, severe BAE, PASP 48  Echo 06/15/16 Mild LVH, EF 55-60, TAVR with trace perivalvular AI and peak gradient 16, mild MR, severe BAE, PASP 50  LHC 05/12/16 LAD mid 25 RI irregularities LCx mid 30 RCA proximal 40, mid 95, distal 40 Mean AV gradient 35, AVA 1.06 cm  Carotid US 10/17 R 40-59; L1-39>>follow-up 1 year  History of Present Illness:     Tyler Williams was last seen by Dr. Burt Williams in May 2021.  He returns for follow-up.  He is here with his daughter.  He has continued to have issues with lower extremity swelling.  He saw one of the PAs with his primary care physician last week and was given an extra dose of furosemide.  He had significant urination with this and some improved leg swelling.  He notes that his leg swelling resolves with leg elevation.  He has not had any significant change in his shortness of breath.  He has not had chest discomfort, orthopnea, paroxysmal nocturnal dyspnea or syncope.  Past Medical History:  Diagnosis Date  . A-fib ([place])   . AAA (abdominal aortic aneurysm) ([place])    s/p repair  . Aortic stenosis, mild    a. mean gradient 17 mmHg on 04/2012 TTE  . Arthritis    knees  . CAD (coronary artery disease)    a. s/p multiple percutaneous prior coronary artery interventions b. 05/2012: unsuccessful PCI to tight RCA->medically managed  . Cancer [facility])    skin cancer removed, right hand, left leg, chest  . Carotid artery disease ([place])    0-39% bilateral ICA stenoses 11/2011  . Chronic anticoagulation    Coumadin  . Coronary artery disease   . GERD (gastroesophageal reflux disease)   . GERD (gastroesophageal reflux disease)   . History of pulmonary embolism    1964 related to dislocated hip on right   . History of skin cancer   . Hypercholesteremia   . Hypertension   .  Incidental cecal mass noted on CT imaging 05/24/2016   **An incidental finding of potential clinical significance has been found. 4.6 x 5.6 x 3.7 cm masslike lesion in the cecum adjacent to the ileocecal valve may represent a large polyp or colonic neoplasm. Correlation with nonemergent colonoscopy is strongly recommended in the near future to better evaluate this finding.**  . Macular degeneration    eye injections  . Microscopic hematuria    Followed by urology  . Permanent atrial fibrillation ([place])   . PVD (peripheral vascular disease)  ([place])    Bilateral renal artery atherosclerosis, SMA stenosis with collateralization  . RBBB    Chronic  . 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 SSS (sick sinus syndrome) (HCC)     Current Medications: Current Meds  Medication Sig  . amLODipine (NORVASC) 5 MG tablet Take 1 tablet (5 mg total) by mouth daily.  Marland Kitchen aspirin EC 81 MG tablet Take 81 mg by mouth daily.  Marland Kitchen atorvastatin (LIPITOR) 20 MG tablet TAKE 1 TABLET ONCE DAILY.  . Calcium-Magnesium-Zinc (CAL-MAG-ZINC PO) Take 1 tablet by mouth daily.  . Cholecalciferol (VITAMIN D3) 5000 units TABS Take 1 tablet by mouth daily.  . Coenzyme Q10 (COQ10) 100 MG CAPS Take 1 tablet by mouth daily.  . fesoterodine (TOVIAZ) 8 MG TB24 tablet Take 8 mg by mouth every evening.  . furosemide (LASIX) 80 MG tablet Take 0.5 tablets (40 mg total) by mouth 2 (two) times daily.  . isosorbide mononitrate (IMDUR) 60 MG 24 hr tablet Take 2 tablets (120 mg total) by mouth daily.  Marland Kitchen LORazepam (ATIVAN) 1 MG tablet Take 1 mg by mouth at bedtime.  . metoprolol succinate (TOPROL-XL) 25 MG 24 hr tablet Take 1 tablet (25 mg total) by mouth daily.  Marland Kitchen neomycin-polymyxin-hydrocortisone (CORTISPORIN) 3.5-10000-1 OTIC suspension   . nitroGLYCERIN (NITROSTAT) 0.4 MG SL tablet Place 0.4 mg under the tongue every 5 (five) minutes as needed for chest pain.  Vladimir Faster Glycol-Propyl Glycol (SYSTANE OP) Place 2 drops into both eyes 2 (two) times daily.  . psyllium (METAMUCIL) 58.6 % packet Take by mouth.  . ramipril (ALTACE) 10 MG capsule Take 10 mg by mouth 2 (two) times daily.  . ranibizumab (LUCENTIS) 0.5 MG/0.05ML SOLN 0.5 mg by Intravitreal route See admin instructions. Every 9 weeks  . warfarin (COUMADIN) 5 MG tablet Take 5-7.5 mg by mouth See admin instructions. Take 1 and 1/2 tablets on Monday then take 1 tablet all the other days      Allergies:   No known allergies   Social History   Tobacco Use  . Smoking status: Never Smoker  . Smokeless tobacco: Never Used  Vaping Use  . Vaping Use: Never used  Substance Use Topics  . Alcohol use: Not Currently    Comment: patient doesn't drink anymore  . Drug use: Never     Family Hx: The patient's family history includes Heart attack (age of onset: 38) in his mother.  ROS   EKGs/Labs/Other Test Reviewed:    EKG:  EKG is   ordered today.  The ekg ordered today demonstrates atrial fibrillation, HR 62, right axis deviation, right bundle branch block, QTC 491, no change from prior tracing  Recent Labs: 03/26/2019: BUN 31; Creatinine, Ser 1.24; Potassium 4.9; Sodium 140   Recent Lipid Panel Lab Results  Component Value Date/Time   CHOL 149 08/28/2017 03:50 AM  TRIG 95 08/28/2017 03:50 AM   HDL 52 08/28/2017 03:50 AM   CHOLHDL 2.9 08/28/2017 03:50 AM   LDLCALC 78 08/28/2017 03:50 AM    Physical Exam:    VS:  BP (!) 130/42   Pulse 62   Ht _0  (1.753 m)   Wt 180 lb (81.6 kg)   SpO2 96%   BMI 26.58 kg/m     Wt Readings from Last 3 Encounters:  10/07/19 180 lb (81.6 kg)  06/30/19 185 lb 6.4 oz (84.1 kg)  03/26/19 181 lb 12.8 oz (82.5 kg)     Constitutional:      Appearance: Healthy appearance. Not in distress.  Pulmonary:     Effort: Pulmonary effort is normal.     Breath sounds: No wheezing. No rales.  Cardiovascular:     Normal rate. Irregularly irregular rhythm. Normal S1. Normal S2.     Murmurs: There is no murmur.  Edema:    Pretibial: trace pitting edema of the left pretibial area and 1+ pitting edema of the right pretibial area. Abdominal:     Palpations: Abdomen is soft.  Musculoskeletal:     Cervical back: Neck supple. Skin:    General: Skin is warm and dry.  Neurological:     General: No focal deficit present.     Mental Status: Alert and oriented to person, place and time.     Cranial Nerves: Cranial nerves are intact.       ASSESSMENT & PLAN:    1. Permanent atrial fibrillation (HCC) Rate is controlled.  He is tolerating anticoagulation.  Continue warfarin.  2. Chronic diastolic CHF (congestive heart failure) ([place]) Echo 7/19: EF 55-60.  NYHA IIb.  Volume status overall stable.  Continue current medications.  3. Coronary artery disease involving native coronary artery of native heart with angina pectoris ([place]) History of multiple PCI procedures in the past.  He has known high-grade disease in the RCA and prior PCI attempt has been unsuccessful.  His anginal symptoms are currently well controlled on his current medical regimen which includes amlodipine, aspirin, atorvastatin, isosorbide, metoprolol succinate.  Continue current therapy.  4. Essential hypertension The patient's blood pressure is controlled on his current regimen.  Continue current therapy.   5. Aortic valve disease 6. S/P TAVR (transcatheter aortic valve replacement) Normally functioning aortic valve replacement by echocardiogram July 2019.  Continue SBE prophylaxis.  7. Leg swelling His leg swelling is multifactorial and mainly related to venous insufficiency.  Amlodipine may be playing somewhat of a role.  Diastolic heart failure is also contributing.  He did much better when he was more active and would like to get back into the maintenance phase of cardiac rehabilitation.  We did discuss possibly changing his amlodipine to hydralazine.  He is not certain he could take a medication 3 times a day.  I would prefer him increasing his activity first before medication changes.  -Continue current medications  -He can take an extra dose of furosemide if needed  -I will follow up with cardiac rehab to see if he can resume maintenance    Dispo:  Return in about 6 months (around 04/08/2020) for Routine Follow Up, w/ Dr. Burt Williams, in person.   Medication Adjustments/Labs and Tests Ordered: Current medicines are reviewed at length with the patient  today.  Concerns regarding medicines are outlined above.  Tests Ordered: Orders Placed This Encounter  Procedures  . EKG 12-Lead   Medication Changes: No orders of the defined types were placed in  this encounter.   Signed, [name], PA-C  10/07/2019 2:45 PM    [facility] [address], [place], [place]  [address] Phone: [phone]; Fax: [phone]

## 2019-10-10 ENCOUNTER — Other Ambulatory Visit: Payer: Self-pay | Admitting: Cardiovascular Disease

## 2019-10-14 ENCOUNTER — Encounter (HOSPITAL_COMMUNITY)
Admission: RE | Admit: 2019-10-14 | Discharge: 2019-10-14 | Disposition: A | Discharge status: Home / Self Care | Payer: Self-pay | Source: Ambulatory Visit | Attending: Cardiovascular Disease | Admitting: Cardiovascular Disease

## 2019-10-14 ENCOUNTER — Other Ambulatory Visit: Payer: Self-pay

## 2019-10-14 DIAGNOSIS — I25119 Atherosclerotic heart disease of native coronary artery with unspecified angina pectoris: Secondary | ICD-10-CM | POA: Insufficient documentation

## 2019-10-16 ENCOUNTER — Other Ambulatory Visit: Payer: Self-pay

## 2019-10-16 ENCOUNTER — Encounter (HOSPITAL_COMMUNITY)
Admission: RE | Admit: 2019-10-16 | Discharge: 2019-10-16 | Disposition: A | Discharge status: Home / Self Care | Payer: Self-pay | Source: Ambulatory Visit | Attending: Cardiovascular Disease | Admitting: Cardiovascular Disease

## 2019-10-16 DIAGNOSIS — I25119 Atherosclerotic heart disease of native coronary artery with unspecified angina pectoris: Secondary | ICD-10-CM | POA: Insufficient documentation

## 2019-10-21 ENCOUNTER — Other Ambulatory Visit: Payer: Self-pay

## 2019-10-21 ENCOUNTER — Encounter (HOSPITAL_COMMUNITY)
Admission: RE | Admit: 2019-10-21 | Discharge: 2019-10-21 | Disposition: A | Discharge status: Home / Self Care | Payer: Self-pay | Source: Ambulatory Visit | Attending: Cardiovascular Disease | Admitting: Cardiovascular Disease

## 2019-10-23 ENCOUNTER — Encounter (HOSPITAL_COMMUNITY)
Admission: RE | Admit: 2019-10-23 | Discharge: 2019-10-23 | Disposition: A | Discharge status: Home / Self Care | Payer: Self-pay | Source: Ambulatory Visit | Attending: Cardiovascular Disease | Admitting: Cardiovascular Disease

## 2019-10-23 ENCOUNTER — Ambulatory Visit (INDEPENDENT_AMBULATORY_CARE_PROVIDER_SITE_OTHER): Payer: Medicare Other | Admitting: *Deleted

## 2019-10-23 ENCOUNTER — Other Ambulatory Visit: Payer: Self-pay

## 2019-10-23 DIAGNOSIS — H353231 Exudative age-related macular degeneration, bilateral, with active choroidal neovascularization: Secondary | ICD-10-CM | POA: Diagnosis not present

## 2019-10-23 DIAGNOSIS — I4891 Unspecified atrial fibrillation: Secondary | ICD-10-CM

## 2019-10-23 DIAGNOSIS — I4821 Permanent atrial fibrillation: Secondary | ICD-10-CM

## 2019-10-23 DIAGNOSIS — Z5181 Encounter for therapeutic drug level monitoring: Secondary | ICD-10-CM | POA: Diagnosis not present

## 2019-10-23 LAB — POCT INR: INR: 2.2 (ref 2.0–3.0)

## 2019-10-23 NOTE — Patient Instructions (Signed)
Description   Continue taking the dose you have been taking, which is: 1 tablet daily except 1.5 tablets on Mondays.  Recheck INR in 4 weeks. Call if placed on any new medications or if scheduled for any procedures.  Coumadin clinic [phone].

## 2019-10-28 ENCOUNTER — Other Ambulatory Visit: Payer: Self-pay

## 2019-10-28 ENCOUNTER — Encounter (HOSPITAL_COMMUNITY)
Admission: RE | Admit: 2019-10-28 | Discharge: 2019-10-28 | Disposition: A | Discharge status: Home / Self Care | Payer: Self-pay | Source: Ambulatory Visit | Attending: Cardiovascular Disease | Admitting: Cardiovascular Disease

## 2019-10-30 ENCOUNTER — Other Ambulatory Visit: Payer: Self-pay

## 2019-10-30 ENCOUNTER — Encounter (HOSPITAL_COMMUNITY)
Admission: RE | Admit: 2019-10-30 | Discharge: 2019-10-30 | Disposition: A | Discharge status: Home / Self Care | Payer: Self-pay | Source: Ambulatory Visit | Attending: Cardiovascular Disease | Admitting: Cardiovascular Disease

## 2019-11-04 ENCOUNTER — Other Ambulatory Visit: Payer: Self-pay

## 2019-11-04 ENCOUNTER — Encounter (HOSPITAL_COMMUNITY)
Admission: RE | Admit: 2019-11-04 | Discharge: 2019-11-04 | Disposition: A | Discharge status: Home / Self Care | Payer: Self-pay | Source: Ambulatory Visit | Attending: Cardiovascular Disease | Admitting: Cardiovascular Disease

## 2019-11-05 ENCOUNTER — Other Ambulatory Visit: Payer: Self-pay | Admitting: Cardiovascular Disease

## 2019-11-06 ENCOUNTER — Other Ambulatory Visit: Payer: Self-pay

## 2019-11-06 ENCOUNTER — Encounter (HOSPITAL_COMMUNITY)
Admission: RE | Admit: 2019-11-06 | Discharge: 2019-11-06 | Disposition: A | Discharge status: Home / Self Care | Payer: Self-pay | Source: Ambulatory Visit | Attending: Cardiovascular Disease | Admitting: Cardiovascular Disease

## 2019-11-11 ENCOUNTER — Encounter (HOSPITAL_COMMUNITY)
Admission: RE | Admit: 2019-11-11 | Discharge: 2019-11-11 | Disposition: A | Discharge status: Home / Self Care | Payer: Self-pay | Source: Ambulatory Visit | Attending: Cardiovascular Disease | Admitting: Cardiovascular Disease

## 2019-11-11 ENCOUNTER — Other Ambulatory Visit: Payer: Self-pay

## 2019-11-13 ENCOUNTER — Encounter (HOSPITAL_COMMUNITY)
Admission: RE | Admit: 2019-11-13 | Discharge: 2019-11-13 | Disposition: A | Discharge status: Home / Self Care | Payer: Self-pay | Source: Ambulatory Visit | Attending: Cardiovascular Disease | Admitting: Cardiovascular Disease

## 2019-11-13 ENCOUNTER — Other Ambulatory Visit: Payer: Self-pay

## 2019-11-17 DIAGNOSIS — Z23 Encounter for immunization: Secondary | ICD-10-CM | POA: Diagnosis not present

## 2019-11-18 ENCOUNTER — Encounter (HOSPITAL_COMMUNITY)
Admission: RE | Admit: 2019-11-18 | Discharge: 2019-11-18 | Disposition: A | Discharge status: Home / Self Care | Payer: Self-pay | Source: Ambulatory Visit | Attending: Cardiovascular Disease | Admitting: Cardiovascular Disease

## 2019-11-18 ENCOUNTER — Other Ambulatory Visit: Payer: Self-pay

## 2019-11-18 DIAGNOSIS — I25119 Atherosclerotic heart disease of native coronary artery with unspecified angina pectoris: Secondary | ICD-10-CM | POA: Insufficient documentation

## 2019-11-20 ENCOUNTER — Other Ambulatory Visit: Payer: Self-pay

## 2019-11-20 ENCOUNTER — Encounter (HOSPITAL_COMMUNITY)
Admission: RE | Admit: 2019-11-20 | Discharge: 2019-11-20 | Disposition: A | Discharge status: Home / Self Care | Payer: Self-pay | Source: Ambulatory Visit | Attending: Cardiovascular Disease | Admitting: Cardiovascular Disease

## 2019-11-21 ENCOUNTER — Ambulatory Visit (INDEPENDENT_AMBULATORY_CARE_PROVIDER_SITE_OTHER): Payer: Medicare Other

## 2019-11-21 DIAGNOSIS — I4821 Permanent atrial fibrillation: Secondary | ICD-10-CM | POA: Diagnosis not present

## 2019-11-21 DIAGNOSIS — Z5181 Encounter for therapeutic drug level monitoring: Secondary | ICD-10-CM | POA: Diagnosis not present

## 2019-11-21 DIAGNOSIS — I4891 Unspecified atrial fibrillation: Secondary | ICD-10-CM | POA: Diagnosis not present

## 2019-11-21 LAB — POCT INR: INR: 3.3 — AB (ref 2.0–3.0)

## 2019-11-21 NOTE — Patient Instructions (Signed)
Description   Skip today's dosage of Warfarin, then resume same dosage 1 tablet daily except 1.5 tablets on Mondays.  Recheck INR in 3 weeks. Call if placed on any new medications or if scheduled for any procedures.  Coumadin clinic [phone].

## 2019-11-25 ENCOUNTER — Encounter (HOSPITAL_COMMUNITY)
Admission: RE | Admit: 2019-11-25 | Discharge: 2019-11-25 | Disposition: A | Discharge status: Home / Self Care | Payer: Self-pay | Source: Ambulatory Visit | Attending: Cardiovascular Disease | Admitting: Cardiovascular Disease

## 2019-11-25 ENCOUNTER — Other Ambulatory Visit: Payer: Self-pay

## 2019-11-27 ENCOUNTER — Other Ambulatory Visit: Payer: Self-pay

## 2019-11-27 ENCOUNTER — Encounter (HOSPITAL_COMMUNITY)
Admission: RE | Admit: 2019-11-27 | Discharge: 2019-11-27 | Disposition: A | Discharge status: Home / Self Care | Payer: Self-pay | Source: Ambulatory Visit | Attending: Cardiovascular Disease | Admitting: Cardiovascular Disease

## 2019-11-27 DIAGNOSIS — H353231 Exudative age-related macular degeneration, bilateral, with active choroidal neovascularization: Secondary | ICD-10-CM | POA: Diagnosis not present

## 2019-11-28 DIAGNOSIS — N3281 Overactive bladder: Secondary | ICD-10-CM | POA: Diagnosis not present

## 2019-12-02 ENCOUNTER — Encounter (HOSPITAL_COMMUNITY)
Admission: RE | Admit: 2019-12-02 | Discharge: 2019-12-02 | Disposition: A | Discharge status: Home / Self Care | Payer: Medicare Other | Source: Ambulatory Visit | Attending: Cardiovascular Disease | Admitting: Cardiovascular Disease

## 2019-12-02 ENCOUNTER — Other Ambulatory Visit: Payer: Self-pay

## 2019-12-04 ENCOUNTER — Encounter (HOSPITAL_COMMUNITY)
Admission: RE | Admit: 2019-12-04 | Discharge: 2019-12-04 | Disposition: A | Discharge status: Home / Self Care | Payer: Medicare Other | Source: Ambulatory Visit | Attending: Cardiovascular Disease | Admitting: Cardiovascular Disease

## 2019-12-04 ENCOUNTER — Other Ambulatory Visit: Payer: Self-pay

## 2019-12-04 DIAGNOSIS — J449 Chronic obstructive pulmonary disease, unspecified: Secondary | ICD-10-CM | POA: Diagnosis not present

## 2019-12-04 DIAGNOSIS — I509 Heart failure, unspecified: Secondary | ICD-10-CM | POA: Diagnosis not present

## 2019-12-04 DIAGNOSIS — R0602 Shortness of breath: Secondary | ICD-10-CM | POA: Diagnosis not present

## 2019-12-04 DIAGNOSIS — R9389 Abnormal findings on diagnostic imaging of other specified body structures: Secondary | ICD-10-CM | POA: Diagnosis not present

## 2019-12-05 ENCOUNTER — Ambulatory Visit
Admission: RE | Admit: 2019-12-05 | Discharge: 2019-12-05 | Disposition: A | Discharge status: Home / Self Care | Payer: Medicare Other | Source: Ambulatory Visit | Attending: Internal Medicine | Admitting: Internal Medicine

## 2019-12-05 ENCOUNTER — Other Ambulatory Visit: Payer: Self-pay | Admitting: Internal Medicine

## 2019-12-05 DIAGNOSIS — R9389 Abnormal findings on diagnostic imaging of other specified body structures: Secondary | ICD-10-CM

## 2019-12-05 DIAGNOSIS — R0602 Shortness of breath: Secondary | ICD-10-CM

## 2019-12-05 DIAGNOSIS — I700 Atherosclerosis of aorta: Secondary | ICD-10-CM | POA: Diagnosis not present

## 2019-12-05 DIAGNOSIS — I2510 Atherosclerotic heart disease of native coronary artery without angina pectoris: Secondary | ICD-10-CM | POA: Diagnosis not present

## 2019-12-05 DIAGNOSIS — J439 Emphysema, unspecified: Secondary | ICD-10-CM | POA: Diagnosis not present

## 2019-12-05 DIAGNOSIS — D0462 Carcinoma in situ of skin of left upper limb, including shoulder: Secondary | ICD-10-CM | POA: Diagnosis not present

## 2019-12-05 DIAGNOSIS — D692 Other nonthrombocytopenic purpura: Secondary | ICD-10-CM | POA: Diagnosis not present

## 2019-12-05 DIAGNOSIS — L570 Actinic keratosis: Secondary | ICD-10-CM | POA: Diagnosis not present

## 2019-12-05 DIAGNOSIS — C44329 Squamous cell carcinoma of skin of other parts of face: Secondary | ICD-10-CM | POA: Diagnosis not present

## 2019-12-05 DIAGNOSIS — J90 Pleural effusion, not elsewhere classified: Secondary | ICD-10-CM | POA: Diagnosis not present

## 2019-12-05 DIAGNOSIS — Z85828 Personal history of other malignant neoplasm of skin: Secondary | ICD-10-CM | POA: Diagnosis not present

## 2019-12-05 DIAGNOSIS — L821 Other seborrheic keratosis: Secondary | ICD-10-CM | POA: Diagnosis not present

## 2019-12-05 MED ORDER — IOPAMIDOL (ISOVUE-300) INJECTION 61%
75.0000 mL | Freq: Once | INTRAVENOUS | Status: AC | PRN
  Administered 2019-12-05: 75 mL via INTRAVENOUS

## 2019-12-09 ENCOUNTER — Encounter (HOSPITAL_COMMUNITY)
Admission: RE | Admit: 2019-12-09 | Discharge: 2019-12-09 | Disposition: A | Discharge status: Home / Self Care | Payer: Self-pay | Source: Ambulatory Visit | Attending: Cardiovascular Disease | Admitting: Cardiovascular Disease

## 2019-12-09 ENCOUNTER — Other Ambulatory Visit: Payer: Self-pay

## 2019-12-11 ENCOUNTER — Other Ambulatory Visit: Payer: Self-pay

## 2019-12-11 ENCOUNTER — Encounter (HOSPITAL_COMMUNITY)
Admission: RE | Admit: 2019-12-11 | Discharge: 2019-12-11 | Disposition: A | Discharge status: Home / Self Care | Payer: Self-pay | Source: Ambulatory Visit | Attending: Cardiovascular Disease | Admitting: Cardiovascular Disease

## 2019-12-12 ENCOUNTER — Ambulatory Visit (INDEPENDENT_AMBULATORY_CARE_PROVIDER_SITE_OTHER): Payer: Medicare Other | Admitting: *Deleted

## 2019-12-12 DIAGNOSIS — I4891 Unspecified atrial fibrillation: Secondary | ICD-10-CM | POA: Diagnosis not present

## 2019-12-12 DIAGNOSIS — Z5181 Encounter for therapeutic drug level monitoring: Secondary | ICD-10-CM | POA: Diagnosis not present

## 2019-12-12 LAB — POCT INR: INR: 3.4 — AB (ref 2.0–3.0)

## 2019-12-12 NOTE — Patient Instructions (Signed)
Description   Skip today's dosage of Warfarin, then start taking 1 tablet daily.  Recheck INR in 3 weeks. Call if placed on any new medications or if scheduled for any procedures.  Coumadin clinic [phone].

## 2019-12-13 DIAGNOSIS — Z23 Encounter for immunization: Secondary | ICD-10-CM | POA: Diagnosis not present

## 2019-12-19 ENCOUNTER — Ambulatory Visit (INDEPENDENT_AMBULATORY_CARE_PROVIDER_SITE_OTHER): Payer: Medicare Other | Admitting: Podiatry

## 2019-12-19 ENCOUNTER — Other Ambulatory Visit: Payer: Self-pay

## 2019-12-19 ENCOUNTER — Encounter: Payer: Self-pay | Admitting: Podiatry

## 2019-12-19 DIAGNOSIS — B351 Tinea unguium: Secondary | ICD-10-CM | POA: Diagnosis not present

## 2019-12-19 DIAGNOSIS — M79675 Pain in left toe(s): Secondary | ICD-10-CM | POA: Diagnosis not present

## 2019-12-19 DIAGNOSIS — M79674 Pain in right toe(s): Secondary | ICD-10-CM

## 2019-12-21 NOTE — Progress Notes (Signed)
Subjective: Tyler Williams is a 86 y.o. male patient seen today painful mycotic nails b/l that are difficult to trim. Pain interferes with ambulation. Aggravating factors include wearing enclosed shoe gear. Pain is relieved with periodic professional debridement.   Tyler Williams voices no new pedal problems on today's visit.  Patient Active Problem List   Diagnosis Date Noted  . Aorta aneurysm ([place]) 02/14/2018  . Chronic diastolic CHF (congestive heart failure) ([place]) 09/25/2017  . Precordial chest pain 08/28/2017  . HLD (hyperlipidemia) 08/28/2017  . Anticoagulated on Coumadin 08/28/2017  . HTN (hypertension) 08/28/2017  . S/P TAVR (transcatheter aortic valve replacement)   . 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 Incidental cecal mass noted on CT imaging 05/24/2016  . Exudative age-related macular 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 ([place]) 12/22/2014  . Encounter for therapeutic drug monitoring 03/20/2013  . Pseudophakia of both eyes 03/19/2013  . Macular degeneration 03/19/2013  . Cholecystitis, acute 12/06/2012  . Chest pain, atypical 12/03/2012  . Hematuria, microscopic 12/03/2012  . Cystoid macular edema 05/14/2012  . Overweight (BMI 25.0-29.9) 01/23/2012  . Hyponatremia 01/23/2012  . S/P right UKR 01/22/2012  . Carotid artery bruit 11/21/2011  . Carotid bruit 11/21/2011  . Presence of intraocular lens 02/16/2011  . EDEMA 07/14/2009  . Essential hypertension 01/27/2009  . HYPERCHOLESTEROLEMIA  IIA 10/21/2008  . Coronary artery disease involving native coronary artery with angina pectoris ([place]) 10/21/2008  . Permanent atrial fibrillation ([place]) 10/21/2008    Current Outpatient Medications on File Prior to Visit  Medication Sig Dispense Refill  . amLODipine (NORVASC) 5 MG tablet Take 1 tablet (5 mg total) by mouth daily. 90 tablet 3  . aspirin EC 81 MG tablet Take 81 mg by mouth daily.    Marland Kitchen atorvastatin (LIPITOR) 20  MG tablet TAKE 1 TABLET ONCE DAILY. 90 tablet 3  . Calcium-Magnesium-Zinc (CAL-MAG-ZINC PO) Take 1 tablet by mouth daily.    . Cholecalciferol (VITAMIN D3) 5000 units TABS Take 1 tablet by mouth daily.    . Coenzyme Q10 (COQ10) 100 MG CAPS Take 1 tablet by mouth daily.    . fesoterodine (TOVIAZ) 8 MG TB24 tablet Take 8 mg by mouth every evening.    . furosemide (LASIX) 80 MG tablet Take 0.5 tablets (40 mg total) by mouth 2 (two) times daily. 90 tablet 3  . isosorbide mononitrate (IMDUR) 60 MG 24 hr tablet Take 2 tablets (120 mg total) by mouth daily. 180 tablet 3  . LORazepam (ATIVAN) 1 MG tablet Take 1 mg by mouth at bedtime.    . metoprolol succinate (TOPROL-XL) 25 MG 24 hr tablet Take 1 tablet (25 mg total) by mouth daily. 90 tablet 3  . neomycin-polymyxin-hydrocortisone (CORTISPORIN) 3.5-10000-1 OTIC suspension     . nitroGLYCERIN (NITROSTAT) 0.4 MG SL tablet Place 0.4 mg under the tongue every 5 (five) minutes as needed for chest pain.    Vladimir Faster Glycol-Propyl Glycol (SYSTANE OP) Place 2 drops into both eyes 2 (two) times daily.    . psyllium (METAMUCIL) 58.6 % packet Take by mouth.    . ramipril (ALTACE) 10 MG capsule Take 10 mg by mouth 2 (two) times daily.    . ranibizumab (LUCENTIS) 0.5 MG/0.05ML SOLN 0.5 mg by Intravitreal route See admin instructions. Every 9 weeks    . warfarin (COUMADIN) 5 MG tablet Take 5-7.5 mg by mouth See admin instructions. Take 1 and 1/2 tablets on Monday then take 1 tablet all the other days  No current facility-administered medications on file prior to visit.    Allergies  Allergen Reactions  . No Known Allergies Other (See Comments)    NKA    Objective: Physical Exam  General: Tyler Williams is a pleasant 86 y.o. African American male, in NAD. AAO x 3.   Vascular:  Neurovascular status unchanged b/l lower extremities. Capillary fill time to digits <3 seconds b/l lower extremities. Palpable DP pulses b/l. Palpable PT pulses b/l.  Pedal hair absent b/l. Skin temperature gradient within normal limits b/l.  Dermatological:  Pedal skin with normal turgor, texture and tone bilaterally. No open wounds bilaterally. No interdigital macerations bilaterally. Toenails 1-5 b/l elongated, discolored, dystrophic, thickened, crumbly with subungual debris and tenderness to dorsal palpation.  Musculoskeletal:  Normal muscle strength 5/5 to all lower extremity muscle groups bilaterally. No pain crepitus or joint limitation noted with ROM b/l. No gross bony deformities bilaterally.  Neurological:  Protective sensation intact 5/5 intact bilaterally with 10g monofilament b/l. Vibratory sensation intact b/l. Proprioception intact bilaterally.  Assessment and Plan:   1. Pain due to onychomycosis of toenails of both feet    -Examined patient. -No new findings. No new orders. -Toenails 1-5 b/l were debrided in length and girth with sterile nail nippers and dremel without iatrogenic bleeding.  -Patient to continue soft, supportive shoe gear daily. -Patient to report any pedal injuries to medical professional immediately. -Patient/POA to call should there be question/concern in the interim.  Return in about 3 months (around 03/20/2020) for painful mycotic toenails.  Marzetta Board, DPM

## 2020-01-01 DIAGNOSIS — H353231 Exudative age-related macular degeneration, bilateral, with active choroidal neovascularization: Secondary | ICD-10-CM | POA: Diagnosis not present

## 2020-01-02 ENCOUNTER — Ambulatory Visit (INDEPENDENT_AMBULATORY_CARE_PROVIDER_SITE_OTHER): Payer: Medicare Other

## 2020-01-02 ENCOUNTER — Other Ambulatory Visit: Payer: Self-pay

## 2020-01-02 DIAGNOSIS — Z5181 Encounter for therapeutic drug level monitoring: Secondary | ICD-10-CM | POA: Diagnosis not present

## 2020-01-02 DIAGNOSIS — I4821 Permanent atrial fibrillation: Secondary | ICD-10-CM | POA: Diagnosis not present

## 2020-01-02 DIAGNOSIS — I4891 Unspecified atrial fibrillation: Secondary | ICD-10-CM

## 2020-01-02 LAB — POCT INR: INR: 1.7 — AB (ref 2.0–3.0)

## 2020-01-02 NOTE — Patient Instructions (Signed)
Description   Take 1.5 tablets today, then resume 1 tablet daily.  Recheck INR in 2 weeks. Call if placed on any new medications or if scheduled for any procedures.  Coumadin clinic [phone].

## 2020-01-05 DIAGNOSIS — Z7901 Long term (current) use of anticoagulants: Secondary | ICD-10-CM | POA: Diagnosis not present

## 2020-01-05 DIAGNOSIS — E663 Overweight: Secondary | ICD-10-CM | POA: Diagnosis not present

## 2020-01-05 DIAGNOSIS — I10 Essential (primary) hypertension: Secondary | ICD-10-CM | POA: Diagnosis not present

## 2020-01-05 DIAGNOSIS — I509 Heart failure, unspecified: Secondary | ICD-10-CM | POA: Diagnosis not present

## 2020-01-09 ENCOUNTER — Other Ambulatory Visit: Payer: Self-pay | Admitting: Cardiovascular Disease

## 2020-01-13 ENCOUNTER — Emergency Department (HOSPITAL_BASED_OUTPATIENT_CLINIC_OR_DEPARTMENT_OTHER)
Admission: EM | Admit: 2020-01-13 | Discharge: 2020-01-13 | Disposition: A | Discharge status: Home / Self Care | Payer: Medicare Other | Attending: Emergency Medicine | Admitting: Emergency Medicine

## 2020-01-13 ENCOUNTER — Other Ambulatory Visit: Payer: Self-pay

## 2020-01-13 ENCOUNTER — Encounter (HOSPITAL_BASED_OUTPATIENT_CLINIC_OR_DEPARTMENT_OTHER): Payer: Self-pay | Admitting: Emergency Medicine

## 2020-01-13 DIAGNOSIS — I110 Hypertensive heart disease with heart failure: Secondary | ICD-10-CM | POA: Insufficient documentation

## 2020-01-13 DIAGNOSIS — I4891 Unspecified atrial fibrillation: Secondary | ICD-10-CM | POA: Diagnosis not present

## 2020-01-13 DIAGNOSIS — Y33XXXA Other specified events, undetermined intent, initial encounter: Secondary | ICD-10-CM | POA: Insufficient documentation

## 2020-01-13 DIAGNOSIS — S5012XA Contusion of left forearm, initial encounter: Secondary | ICD-10-CM | POA: Insufficient documentation

## 2020-01-13 DIAGNOSIS — Z7901 Long term (current) use of anticoagulants: Secondary | ICD-10-CM | POA: Diagnosis not present

## 2020-01-13 DIAGNOSIS — I5032 Chronic diastolic (congestive) heart failure: Secondary | ICD-10-CM | POA: Diagnosis not present

## 2020-01-13 DIAGNOSIS — Z79899 Other long term (current) drug therapy: Secondary | ICD-10-CM | POA: Diagnosis not present

## 2020-01-13 DIAGNOSIS — Z7982 Long term (current) use of aspirin: Secondary | ICD-10-CM | POA: Insufficient documentation

## 2020-01-13 DIAGNOSIS — T148XXA Other injury of unspecified body region, initial encounter: Secondary | ICD-10-CM

## 2020-01-13 LAB — BASIC METABOLIC PANEL
Anion gap: 9 (ref 5–15)
BUN: 22 mg/dL (ref 8–23)
CO2: 21 mmol/L — ABNORMAL LOW (ref 22–32)
Calcium: 9.2 mg/dL (ref 8.9–10.3)
Chloride: 107 mmol/L (ref 98–111)
Creatinine, Ser: 0.93 mg/dL (ref 0.61–1.24)
GFR, Estimated: >60 mL/min (ref >60)
Glucose, Bld: 99 mg/dL (ref 70–99)
Potassium: 4 mmol/L (ref 3.5–5.1)
Sodium: 137 mmol/L (ref 135–145)

## 2020-01-13 LAB — PROTIME-INR
INR: 2.8 — ABNORMAL HIGH (ref 0.8–1.2)
Prothrombin Time: 28.4 seconds — ABNORMAL HIGH (ref 11.4–15.2)

## 2020-01-13 LAB — CBC WITH DIFFERENTIAL/PLATELET
Abs Immature Granulocytes: 0.04 10*3/uL (ref 0.00–0.07)
Basophils Absolute: 0.1 10*3/uL (ref 0.0–0.1)
Basophils Relative: 1 %
Eosinophils Absolute: 0.1 10*3/uL (ref 0.0–0.5)
Eosinophils Relative: 1 %
HCT: 43.3 % (ref 39.0–52.0)
Hemoglobin: 14.6 g/dL (ref 13.0–17.0)
Immature Granulocytes: 1 %
Lymphocytes Relative: 15 %
Lymphs Abs: 1.1 10*3/uL (ref 0.7–4.0)
MCH: 31.2 pg (ref 26.0–34.0)
MCHC: 33.7 g/dL (ref 30.0–36.0)
MCV: 92.5 fL (ref 80.0–100.0)
Monocytes Absolute: 0.6 10*3/uL (ref 0.1–1.0)
Monocytes Relative: 8 %
Neutro Abs: 5.3 10*3/uL (ref 1.7–7.7)
Neutrophils Relative %: 74 %
Platelets: 133 10*3/uL — ABNORMAL LOW (ref 150–400)
RBC: 4.68 MIL/uL (ref 4.22–5.81)
RDW: 15.2 % (ref 11.5–15.5)
WBC: 7.2 10*3/uL (ref 4.0–10.5)
nRBC: 0 % (ref 0.0–0.2)

## 2020-01-13 NOTE — ED Notes (Signed)
Pt discharged to home. Discharge instructions have been discussed with patient and/or family members. Pt verbally acknowledges understanding d/c instructions, and endorses comprehension to checkout at registration before leaving.  °

## 2020-01-13 NOTE — ED Triage Notes (Signed)
Pt noticed that he has some significant bruising to left arm with small area of hematoma.  Unknown injury.  Pt is on coumadin.

## 2020-01-13 NOTE — ED Provider Notes (Signed)
[facility] EMERGENCY DEPARTMENT Provider Note   CSN: [id number] Arrival date & time: 01/13/20  1442     History Chief Complaint  Patient presents with  . Skin Problem  . Bleeding/Bruising    Tyler Williams is a 86 y.o. male.  The history is provided by the patient.  Illness Location:  Left arm Severity:  Mild Onset quality:  Gradual Timing:  Constant Progression:  Unchanged Chronicity:  New Context:  Bruise to left arm, on coudmin.  Relieved by:  Nothing Worsened by:  Nothing Associated symptoms: no fever, no headaches and no rash        Past Medical History:  Diagnosis Date  . A-fib ([place])   . AAA (abdominal aortic aneurysm) ([place])    s/p repair  . Aortic stenosis, mild    a. mean gradient 17 mmHg on 04/2012 TTE  . Arthritis    knees  . CAD (coronary artery disease)    a. s/p multiple percutaneous prior coronary artery interventions b. 05/2012: unsuccessful PCI to tight RCA->medically managed  . Cancer [facility])    skin cancer removed, right hand, left leg, chest  . Carotid artery disease ([place])    0-39% bilateral ICA stenoses 11/2011  . Chronic anticoagulation    Coumadin  . Coronary artery disease   . GERD (gastroesophageal reflux disease)   . GERD (gastroesophageal reflux disease)   . History of pulmonary embolism    1964 related to dislocated hip on right   . History of skin cancer   . Hypercholesteremia   . Hypertension   . Incidental cecal mass noted on CT imaging 05/24/2016   **An incidental finding of potential clinical significance has been found. 4.6 x 5.6 x 3.7 cm masslike lesion in the cecum adjacent to the ileocecal valve may represent a large polyp or colonic neoplasm. Correlation with nonemergent colonoscopy is strongly recommended in the near future to better evaluate this finding.**  . Macular degeneration    eye injections  . Microscopic hematuria    Followed by urology  . Permanent atrial fibrillation ([place])   . PVD  (peripheral vascular disease) ([place])    Bilateral renal artery atherosclerosis, SMA stenosis with collateralization  . RBBB    Chronic  . 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 SSS (sick sinus syndrome) [facility])     Patient Active Problem List   Diagnosis Date Noted  . Aorta aneurysm ([place]) 02/14/2018  . Chronic diastolic CHF (congestive heart failure) ([place]) 09/25/2017  . Precordial chest pain 08/28/2017  . HLD (hyperlipidemia) 08/28/2017  . Anticoagulated on Coumadin 08/28/2017  . HTN (hypertension) 08/28/2017  . S/P TAVR (transcatheter aortic valve replacement)   . 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 Incidental cecal mass noted on CT imaging 05/24/2016  . Exudative age-related macular 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 ([place]) 12/22/2014  . Encounter for therapeutic drug monitoring 03/20/2013  . Pseudophakia of both eyes 03/19/2013  . Macular degeneration 03/19/2013  . Cholecystitis, acute 12/06/2012  . Chest pain, atypical 12/03/2012  . Hematuria, microscopic 12/03/2012  . Cystoid macular edema 05/14/2012  . Overweight (BMI 25.0-29.9) 01/23/2012  . Hyponatremia 01/23/2012  . S/P right UKR 01/22/2012  . Carotid artery bruit 11/21/2011  . Carotid bruit 11/21/2011  . Presence of intraocular lens 02/16/2011  . EDEMA 07/14/2009  . Essential hypertension 01/27/2009  . HYPERCHOLESTEROLEMIA  IIA  10/21/2008  . Coronary artery disease involving native coronary artery with angina pectoris ([place]) 10/21/2008  . Permanent atrial fibrillation ([place]) 10/21/2008    Past Surgical History:  Procedure Laterality Date  . ABDOMINAL AORTIC ANEURYSM REPAIR     1993   . CARDIAC CATHETERIZATION  05/24/2012   pD1 20%, oCFX 20%, mCFX 30%, ostial Int Br 30%, distal AV groove CFX 30%, pRCA 30-40%, mRCA 99%, dRCA 30%, PL  branch small with diffuse 40%. Unable to pass wire past RCA lesion->medically managed  . CHOLECYSTECTOMY N/A 12/05/2012   Procedure: LAPAROSCOPIC CHOLECYSTECTOMY ;  Surgeon: Edward Jolly, MD;  Location: [facility];  Service: General;  Laterality: N/A;  . CHOLECYSTECTOMY    . CORONARY ANGIOPLASTY  1980, 1990  . CORONARY BALLOON ANGIOPLASTY N/A 08/31/2017   Procedure: CORONARY BALLOON ANGIOPLASTY;  Surgeon: Martinique, Peter M, MD;  Location: [facility] CV LAB;  Service: Cardiovascular;  Laterality: N/A;  . EYE SURGERY     hx of cataract surgery   . [place]  . HERNIA REPAIR     right inguinal hernia repair   . JOINT REPLACEMENT     left knee replacement, partial right  . LEFT HEART CATH AND CORONARY ANGIOGRAPHY N/A 08/31/2017   Procedure: LEFT HEART CATH AND CORONARY ANGIOGRAPHY;  Surgeon: Martinique, Peter M, MD;  Location: [facility] CV LAB;  Service: Cardiovascular;  Laterality: N/A;  . LEFT HEART CATHETERIZATION WITH CORONARY ANGIOGRAM N/A 05/24/2012   Procedure: LEFT HEART CATHETERIZATION WITH CORONARY ANGIOGRAM;  Surgeon: Burnell Blanks, MD;  Location: [facility] CATH LAB;  Service: Cardiovascular;  Laterality: N/A;  . ORTHOPEDIC SURGERY    . OTHER SURGICAL HISTORY     right knee popliteal aneurysm surgery stent placed   . OTHER SURGICAL HISTORY    . PARTIAL KNEE ARTHROPLASTY  01/22/2012   Procedure: UNICOMPARTMENTAL KNEE;  Surgeon: Mauri Pole, MD;  Location: [facility] OR[facility];  Service: Orthopedics;  Laterality: Right;  . PERCUTANEOUS CORONARY INTERVENTION-BALLOON ONLY  05/24/2012   Procedure: PERCUTANEOUS CORONARY INTERVENTION-BALLOON ONLY;  Surgeon: Burnell Blanks, MD;  Location: [facility] CATH LAB;  Service: Cardiovascular;;  . RIGHT/LEFT HEART CATH AND CORONARY ANGIOGRAPHY N/A 05/12/2016   Procedure: Right/Left Heart Cath and Coronary Angiography;  Surgeon: Sherren Mocha, MD;  Location: [facility] CV LAB;  Service: Cardiovascular;  Laterality: N/A;  . TEE WITHOUT  CARDIOVERSION N/A 06/13/2016   Procedure: TRANSESOPHAGEAL ECHOCARDIOGRAM (TEE);  Surgeon: Sherren Mocha, MD;  Location: [facility];  Service: Open Heart Surgery;  Laterality: N/A;  . TONSILLECTOMY    . TOTAL KNEE ARTHROPLASTY Left   . TRANSCATHETER AORTIC VALVE REPLACEMENT, TRANSFEMORAL N/A 06/13/2016   Procedure: TRANSCATHETER AORTIC VALVE REPLACEMENT, TRANSFEMORAL;  Surgeon: Sherren Mocha, MD;  Location: [facility];  Service: Open Heart Surgery;  Laterality: N/A;       Family History  Problem Relation Age of Onset  . Heart attack Mother 30    Social History   Tobacco Use  . Smoking status: Never Smoker  . Smokeless tobacco: Never Used  Vaping Use  . Vaping Use: Never used  Substance Use Topics  . Alcohol use: Not Currently    Comment: patient doesn't drink anymore  . Drug use: Never    Home Medications Prior to Admission medications   Medication Sig Start Date End Date Taking? Authorizing Provider  amLODipine (NORVASC) 5 MG tablet Take 1 tablet (5 mg total) by mouth daily. 11/05/19   Sherren Mocha, MD  aspirin EC 81 MG tablet Take  81 mg by mouth daily.    [provider]  atorvastatin (LIPITOR) 20 MG tablet TAKE 1 TABLET ONCE DAILY. 04/21/19   Sherren Mocha, MD  Calcium-Magnesium-Zinc (CAL-MAG-ZINC PO) Take 1 tablet by mouth daily.    [provider]  Cholecalciferol (VITAMIN D3) 5000 units TABS Take 1 tablet by mouth daily.    [provider]  Coenzyme Q10 (COQ10) 100 MG CAPS Take 1 tablet by mouth daily.    [provider]  fesoterodine (TOVIAZ) 8 MG TB24 tablet Take 8 mg by mouth every evening.    [provider]  furosemide (LASIX) 80 MG tablet TAKE 1 TABLET ONCE DAILY. 01/12/20   Sherren Mocha, MD  isosorbide mononitrate (IMDUR) 60 MG 24 hr tablet Take 2 tablets (120 mg total) by mouth daily. 03/26/19   [name] T, PA-C  LORazepam (ATIVAN) 1 MG tablet Take 1 mg by mouth at bedtime.    [provider]  metoprolol  succinate (TOPROL-XL) 25 MG 24 hr tablet Take 1 tablet (25 mg total) by mouth daily. 10/10/19   [name] T, PA-C  neomycin-polymyxin-hydrocortisone (CORTISPORIN) 3.5-10000-1 OTIC suspension  09/16/19   [provider]  nitroGLYCERIN (NITROSTAT) 0.4 MG SL tablet Place 0.4 mg under the tongue every 5 (five) minutes as needed for chest pain.    [provider]  Polyethyl Glycol-Propyl Glycol (SYSTANE OP) Place 2 drops into both eyes 2 (two) times daily.    [provider]  psyllium (METAMUCIL) 58.6 % packet Take by mouth.    [provider]  ramipril (ALTACE) 10 MG capsule Take 10 mg by mouth 2 (two) times daily.    [provider]  ranibizumab (LUCENTIS) 0.5 MG/0.05ML SOLN 0.5 mg by Intravitreal route See admin instructions. Every 9 weeks    [provider]  warfarin (COUMADIN) 5 MG tablet Take 5-7.5 mg by mouth See admin instructions. Take 1 and 1/2 tablets on Monday then take 1 tablet all the other days    [provider]    Allergies    No known allergies  Review of Systems   Review of Systems  Constitutional: Negative for fever.  Musculoskeletal: Negative for arthralgias and joint swelling.  Skin: Positive for color change. Negative for pallor, rash and wound.  Neurological: Negative for weakness, numbness and headaches.    Physical Exam Updated Vital Signs  ED Triage Vitals  Enc Vitals Group     BP 01/13/20 1457 (!) 183/73     Pulse --      Resp 01/13/20 1457 18     Temp 01/13/20 1457 98.4 F (36.9 C)     Temp Source 01/13/20 1457 Oral     SpO2 --      Weight 01/13/20 1457 178 lb (80.7 kg)     Height 01/13/20 1457 5\' 8"  (1.727 m)     Head Circumference --      Peak Flow --      Pain Score 01/13/20 1508 0     Pain Loc --      Pain Edu? --      Excl. in [place]? --     Physical Exam Vitals and nursing note reviewed.  Constitutional:      General: He is not in acute distress.    Appearance: He is  well-developed. He is not ill-appearing.  HENT:     Head: Normocephalic and atraumatic.  Eyes:     Conjunctiva/sclera: Conjunctivae normal.  Cardiovascular:     Rate and  Rhythm: Normal rate and regular rhythm.     Pulses: Normal pulses.     Heart sounds: No murmur heard.   Pulmonary:     Effort: Pulmonary effort is normal. No respiratory distress.     Breath sounds: Normal breath sounds.  Abdominal:     Palpations: Abdomen is soft.     Tenderness: There is no abdominal tenderness.  Musculoskeletal:        General: No tenderness. Normal range of motion.     Cervical back: Neck supple.  Skin:    General: Skin is warm and dry.     Findings: Bruising (left forearm, old age appearing with small hematoma) present.  Neurological:     Mental Status: He is alert.     Sensory: No sensory deficit.     Motor: No weakness.     ED Results / Procedures / Treatments   Labs (all labs ordered are listed, but only abnormal results are displayed) Labs Reviewed  CBC WITH DIFFERENTIAL/PLATELET - Abnormal; Notable for the following components:      Result Value   Platelets 133 (*)    All other components within normal limits  PROTIME-INR - Abnormal; Notable for the following components:   Prothrombin Time 28.4 (*)    INR 2.8 (*)    All other components within normal limits  BASIC METABOLIC PANEL    EKG None  Radiology No results found.  Procedures Procedures (including critical care time)  Medications Ordered in ED Medications - No data to display  ED Course  I have reviewed the triage vital signs and the nursing notes.  Pertinent labs & imaging results that were available during my care of the patient were reviewed by me and considered in my medical decision making (see chart for details).    MDM Rules/Calculators/A&P                          Tyler Williams is here with left arm bruising and small hematoma to left forearm.  No major trauma.  Bruising overall appears  old.  Likely hit himself on the arm and incidentally because a hematoma.  Pain is over the mid forearm but there is no deformity.  No real tenderness to palpation.  Coumadin is 2.8.  Lab work otherwise unremarkable.  Recommend some basic wound care and follow-up with primary care doctor.  Discharged in ED in good condition.  No need for any imaging given no trauma, no real tenderness on exam.  This chart was dictated using voice recognition software.  Despite best efforts to proofread,  errors can occur which can change the documentation meaning.    Final Clinical Impression(s) / ED Diagnoses Final diagnoses:  Bruise    Rx / DC Orders ED Discharge Orders    None       Lennice Sites, DO 01/13/20 1621

## 2020-01-16 ENCOUNTER — Ambulatory Visit (INDEPENDENT_AMBULATORY_CARE_PROVIDER_SITE_OTHER): Payer: Medicare Other | Admitting: *Deleted

## 2020-01-16 ENCOUNTER — Other Ambulatory Visit: Payer: Self-pay

## 2020-01-16 DIAGNOSIS — I4821 Permanent atrial fibrillation: Secondary | ICD-10-CM | POA: Diagnosis not present

## 2020-01-16 DIAGNOSIS — Z5181 Encounter for therapeutic drug level monitoring: Secondary | ICD-10-CM

## 2020-01-16 DIAGNOSIS — I4891 Unspecified atrial fibrillation: Secondary | ICD-10-CM | POA: Diagnosis not present

## 2020-01-16 LAB — POCT INR: INR: 2.7 (ref 2.0–3.0)

## 2020-01-16 NOTE — Patient Instructions (Signed)
Description   Continue taking 1 tablet daily.  Recheck INR in 3 weeks. Call if placed on any new medications or if scheduled for any procedures. Coumadin clinic [phone].

## 2020-01-29 DIAGNOSIS — H353231 Exudative age-related macular degeneration, bilateral, with active choroidal neovascularization: Secondary | ICD-10-CM | POA: Diagnosis not present

## 2020-02-12 ENCOUNTER — Ambulatory Visit (INDEPENDENT_AMBULATORY_CARE_PROVIDER_SITE_OTHER): Payer: Medicare Other | Admitting: Pharmacist

## 2020-02-12 ENCOUNTER — Other Ambulatory Visit: Payer: Self-pay

## 2020-02-12 DIAGNOSIS — I4891 Unspecified atrial fibrillation: Secondary | ICD-10-CM

## 2020-02-12 DIAGNOSIS — Z5181 Encounter for therapeutic drug level monitoring: Secondary | ICD-10-CM | POA: Diagnosis not present

## 2020-02-12 DIAGNOSIS — I4821 Permanent atrial fibrillation: Secondary | ICD-10-CM

## 2020-02-12 LAB — POCT INR: INR: 2.3 (ref 2.0–3.0)

## 2020-02-12 NOTE — Patient Instructions (Signed)
Description   Continue taking 1 tablet daily.  Recheck INR in 5 weeks. Call if placed on any new medications or if scheduled for any procedures. Coumadin clinic #[phone].      

## 2020-03-02 ENCOUNTER — Ambulatory Visit (INDEPENDENT_AMBULATORY_CARE_PROVIDER_SITE_OTHER): Payer: Medicare Other | Admitting: Podiatry

## 2020-03-02 ENCOUNTER — Other Ambulatory Visit: Payer: Self-pay

## 2020-03-02 ENCOUNTER — Encounter: Payer: Self-pay | Admitting: Podiatry

## 2020-03-02 DIAGNOSIS — M79675 Pain in left toe(s): Secondary | ICD-10-CM

## 2020-03-02 DIAGNOSIS — M79674 Pain in right toe(s): Secondary | ICD-10-CM

## 2020-03-02 DIAGNOSIS — B351 Tinea unguium: Secondary | ICD-10-CM

## 2020-03-02 NOTE — Progress Notes (Signed)
Subjective: Tyler Williams is a 87 y.o. male patient seen today painful mycotic nails b/l that are difficult to trim. Pain interferes with ambulation. Aggravating factors include wearing enclosed shoe gear. Pain is relieved with periodic professional debridement.   His son is present during today's visit. Tyler Williams voices no new pedal problems on today's visit.  Patient Active Problem List   Diagnosis Date Noted  . Aorta aneurysm ([place]) 02/14/2018  . Chronic diastolic CHF (congestive heart failure) ([place]) 09/25/2017  . Precordial chest pain 08/28/2017  . HLD (hyperlipidemia) 08/28/2017  . Anticoagulated on Coumadin 08/28/2017  . HTN (hypertension) 08/28/2017  . S/P TAVR (transcatheter aortic valve replacement)   . 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 Incidental cecal mass noted on CT imaging 05/24/2016  . Exudative age-related macular 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 ([place]) 12/22/2014  . Encounter for therapeutic drug monitoring 03/20/2013  . Pseudophakia of both eyes 03/19/2013  . Macular degeneration 03/19/2013  . Cholecystitis, acute 12/06/2012  . Chest pain, atypical 12/03/2012  . Hematuria, microscopic 12/03/2012  . Cystoid macular edema 05/14/2012  . Overweight (BMI 25.0-29.9) 01/23/2012  . Hyponatremia 01/23/2012  . S/P right UKR 01/22/2012  . Carotid artery bruit 11/21/2011  . Carotid bruit 11/21/2011  . Presence of intraocular lens 02/16/2011  . EDEMA 07/14/2009  . Essential hypertension 01/27/2009  . HYPERCHOLESTEROLEMIA  IIA 10/21/2008  . Coronary artery disease involving native coronary artery with angina pectoris ([place]) 10/21/2008  . Permanent atrial fibrillation ([place]) 10/21/2008    Current Outpatient Medications on File Prior to Visit  Medication Sig Dispense Refill  . amLODipine (NORVASC) 5 MG tablet Take 1 tablet (5 mg total) by mouth daily. 90 tablet 3  . aspirin EC 81 MG tablet Take 81 mg by  mouth daily.    Marland Kitchen atorvastatin (LIPITOR) 20 MG tablet TAKE 1 TABLET ONCE DAILY. 90 tablet 3  . Calcium-Magnesium-Zinc (CAL-MAG-ZINC PO) Take 1 tablet by mouth daily.    . Cholecalciferol (VITAMIN D3) 5000 units TABS Take 1 tablet by mouth daily.    . Coenzyme Q10 (COQ10) 100 MG CAPS Take 1 tablet by mouth daily.    . fesoterodine (TOVIAZ) 8 MG TB24 tablet Take 8 mg by mouth every evening.    . furosemide (LASIX) 80 MG tablet TAKE 1 TABLET ONCE DAILY. 90 tablet 2  . isosorbide mononitrate (IMDUR) 60 MG 24 hr tablet Take 2 tablets (120 mg total) by mouth daily. 180 tablet 3  . LORazepam (ATIVAN) 1 MG tablet Take 1 mg by mouth at bedtime.    . metoprolol succinate (TOPROL-XL) 25 MG 24 hr tablet Take 1 tablet (25 mg total) by mouth daily. 90 tablet 3  . neomycin-polymyxin-hydrocortisone (CORTISPORIN) 3.5-10000-1 OTIC suspension     . nitroGLYCERIN (NITROSTAT) 0.4 MG SL tablet Place 0.4 mg under the tongue every 5 (five) minutes as needed for chest pain.    Vladimir Faster Glycol-Propyl Glycol (SYSTANE OP) Place 2 drops into both eyes 2 (two) times daily.    . psyllium (METAMUCIL) 58.6 % packet Take by mouth.    . ramipril (ALTACE) 10 MG capsule Take 10 mg by mouth 2 (two) times daily.    . ranibizumab (LUCENTIS) 0.5 MG/0.05ML SOLN 0.5 mg by Intravitreal route See admin instructions. Every 9 weeks    . warfarin (COUMADIN) 5 MG tablet Take 5-7.5 mg by mouth See admin instructions. Take 1 and 1/2 tablets on Monday then take 1 tablet all the other days  No current facility-administered medications on file prior to visit.    Allergies  Allergen Reactions  . No Known Allergies Other (See Comments)    NKA    Objective: Physical Exam  General: Tyler Williams is a pleasant 87 y.o. African American male, in NAD. AAO x 3.   Vascular:  Neurovascular status unchanged b/l lower extremities. Capillary fill time to digits <3 seconds b/l lower extremities. Palpable DP pulses b/l. Palpable PT pulses  b/l. Pedal hair absent b/l. Skin temperature gradient within normal limits b/l.  Dermatological:  Pedal skin with normal turgor, texture and tone bilaterally. No open wounds bilaterally. No interdigital macerations bilaterally. Toenails 1-5 b/l elongated, discolored, dystrophic, thickened, crumbly with subungual debris and tenderness to dorsal palpation.  Musculoskeletal:  Normal muscle strength 5/5 to all lower extremity muscle groups bilaterally. No pain crepitus or joint limitation noted with ROM b/l. No gross bony deformities bilaterally.  Neurological:  Protective sensation intact 5/5 intact bilaterally with 10g monofilament b/l. Vibratory sensation intact b/l. Proprioception intact bilaterally.  Assessment and Plan:   1. Pain due to onychomycosis of toenails of both feet    -Examined patient. -No new findings. No new orders. -Toenails 1-5 b/l were debrided in length and girth with sterile nail nippers and dremel without iatrogenic bleeding.  -Patient to continue soft, supportive shoe gear daily. -Patient to report any pedal injuries to medical professional immediately. -Patient/POA to call should there be question/concern in the interim.  Return in about 3 months (around 05/31/2020).  Marzetta Board, DPM

## 2020-03-04 DIAGNOSIS — H353231 Exudative age-related macular degeneration, bilateral, with active choroidal neovascularization: Secondary | ICD-10-CM | POA: Diagnosis not present

## 2020-03-15 DIAGNOSIS — I739 Peripheral vascular disease, unspecified: Secondary | ICD-10-CM | POA: Diagnosis not present

## 2020-03-15 DIAGNOSIS — I130 Hypertensive heart and chronic kidney disease with heart failure and stage 1 through stage 4 chronic kidney disease, or unspecified chronic kidney disease: Secondary | ICD-10-CM | POA: Diagnosis not present

## 2020-03-15 DIAGNOSIS — D692 Other nonthrombocytopenic purpura: Secondary | ICD-10-CM | POA: Diagnosis not present

## 2020-03-15 DIAGNOSIS — E785 Hyperlipidemia, unspecified: Secondary | ICD-10-CM | POA: Diagnosis not present

## 2020-03-15 DIAGNOSIS — N182 Chronic kidney disease, stage 2 (mild): Secondary | ICD-10-CM | POA: Diagnosis not present

## 2020-03-15 DIAGNOSIS — Z952 Presence of prosthetic heart valve: Secondary | ICD-10-CM | POA: Diagnosis not present

## 2020-03-15 DIAGNOSIS — R609 Edema, unspecified: Secondary | ICD-10-CM | POA: Diagnosis not present

## 2020-03-15 DIAGNOSIS — I509 Heart failure, unspecified: Secondary | ICD-10-CM | POA: Diagnosis not present

## 2020-03-15 DIAGNOSIS — I482 Chronic atrial fibrillation, unspecified: Secondary | ICD-10-CM | POA: Diagnosis not present

## 2020-03-15 DIAGNOSIS — I700 Atherosclerosis of aorta: Secondary | ICD-10-CM | POA: Diagnosis not present

## 2020-03-15 DIAGNOSIS — I2510 Atherosclerotic heart disease of native coronary artery without angina pectoris: Secondary | ICD-10-CM | POA: Diagnosis not present

## 2020-03-19 ENCOUNTER — Other Ambulatory Visit: Payer: Self-pay

## 2020-03-19 ENCOUNTER — Ambulatory Visit (INDEPENDENT_AMBULATORY_CARE_PROVIDER_SITE_OTHER): Payer: Medicare Other

## 2020-03-19 DIAGNOSIS — Z5181 Encounter for therapeutic drug level monitoring: Secondary | ICD-10-CM

## 2020-03-19 DIAGNOSIS — I4821 Permanent atrial fibrillation: Secondary | ICD-10-CM

## 2020-03-19 DIAGNOSIS — I4891 Unspecified atrial fibrillation: Secondary | ICD-10-CM | POA: Diagnosis not present

## 2020-03-19 LAB — POCT INR: INR: 2.2 (ref 2.0–3.0)

## 2020-03-19 NOTE — Patient Instructions (Signed)
Continue taking 1 tablet daily.  Recheck INR in 6 weeks. Call if placed on any new medications or if scheduled for any procedures. Coumadin clinic [phone].

## 2020-04-06 ENCOUNTER — Other Ambulatory Visit: Payer: Self-pay | Admitting: Physician Assistant

## 2020-04-08 ENCOUNTER — Other Ambulatory Visit: Payer: Self-pay | Admitting: Cardiovascular Disease

## 2020-04-12 ENCOUNTER — Other Ambulatory Visit: Payer: Self-pay

## 2020-04-12 ENCOUNTER — Ambulatory Visit (INDEPENDENT_AMBULATORY_CARE_PROVIDER_SITE_OTHER): Payer: Medicare Other | Admitting: Cardiovascular Disease

## 2020-04-12 ENCOUNTER — Encounter: Payer: Self-pay | Admitting: Cardiovascular Disease

## 2020-04-12 VITALS — BP 130/76 | HR 63 | Ht 68.0 in | Wt 189.8 lb

## 2020-04-12 DIAGNOSIS — I4821 Permanent atrial fibrillation: Secondary | ICD-10-CM | POA: Diagnosis not present

## 2020-04-12 DIAGNOSIS — Z952 Presence of prosthetic heart valve: Secondary | ICD-10-CM

## 2020-04-12 DIAGNOSIS — M7989 Other specified soft tissue disorders: Secondary | ICD-10-CM | POA: Diagnosis not present

## 2020-04-12 DIAGNOSIS — I5032 Chronic diastolic (congestive) heart failure: Secondary | ICD-10-CM

## 2020-04-12 DIAGNOSIS — I25119 Atherosclerotic heart disease of native coronary artery with unspecified angina pectoris: Secondary | ICD-10-CM | POA: Diagnosis not present

## 2020-04-12 NOTE — Patient Instructions (Addendum)
Medication Instructions:  1) STOP AMLODIPINE *If you need a refill on your cardiac medications before your next appointment, please call your pharmacy*   Follow-Up: You have an appointment with Dr. Burt Knack on July 21, 2020 at 2:00PM.

## 2020-04-12 NOTE — Progress Notes (Signed)
Cardiology Office Note:    Date:  04/15/2020   ID:  Tyler Williams, DOB 03-10-1926, MRN [id number]  PCP:  Burnard Bunting, [place]  Cardiologist:  Sherren Mocha, MD  Advanced Practice Provider:  No care team member to display Electrophysiologist:  None       Referring MD: Burnard Bunting, MD   Chief Complaint  Patient presents with  . Leg Swelling    History of Present Illness:    Tyler Williams is a 87 y.o. male with a hx of:  Permanent atrial fibrillation ? Coumadin anticoagulation  Coronary artery disease ? S/p POBA in the 1980s, 1990s ? Known high-grade stenosis of mid RCA; unsuccessful attempted PCIin past ? Cath 7/19: Mid RCA, 85 >>unsuccessful PCI attempt to the RCA  Peripheral arterial disease  AAA s/p repair  Aortic stenosis ? S/p TAVR 03/3534  Chronic diastolic CHF  Venous insufficiency    The patient is here with his daughter today. He is not doing very well. Feels 'lonely' in his home. He lived on his daughter's farm for a few months, but is now back in his home here in [place]. He is going to move into [place] next month.  No chest pain or shortness of breath at present.  However, he is experiencing intermittent dizziness.  He has gait instability as well.  The patient reports chronic leg swelling with no significant change recently.  No orthopnea or PND.  Past Medical History:  Diagnosis Date  . A-fib ([place])   . AAA (abdominal aortic aneurysm) ([place])    s/p repair  . Aortic stenosis, mild    a. mean gradient 17 mmHg on 04/2012 TTE  . Arthritis    knees  . CAD (coronary artery disease)    a. s/p multiple percutaneous prior coronary artery interventions b. 05/2012: unsuccessful PCI to tight RCA->medically managed  . Cancer [facility])    skin cancer removed, right hand, left leg, chest  . Carotid artery disease ([place])    0-39% bilateral ICA stenoses 11/2011  . Chronic anticoagulation    Coumadin  .  Coronary artery disease   . GERD (gastroesophageal reflux disease)   . GERD (gastroesophageal reflux disease)   . History of pulmonary embolism    1964 related to dislocated hip on right   . History of skin cancer   . Hypercholesteremia   . Hypertension   . Incidental cecal mass noted on CT imaging 05/24/2016   **An incidental finding of potential clinical significance has been found. 4.6 x 5.6 x 3.7 cm masslike lesion in the cecum adjacent to the ileocecal valve may represent a large polyp or colonic neoplasm. Correlation with nonemergent colonoscopy is strongly recommended in the near future to better evaluate this finding.**  . Macular degeneration    eye injections  . Microscopic hematuria    Followed by urology  . Permanent atrial fibrillation ([place])   . PVD (peripheral vascular disease) ([place])    Bilateral renal artery atherosclerosis, SMA stenosis with collateralization  . RBBB    Chronic  . 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 SSS (sick sinus syndrome) [facility])     Past Surgical History:  Procedure Laterality Date  . ABDOMINAL AORTIC ANEURYSM REPAIR     1993   . CARDIAC CATHETERIZATION  05/24/2012   pD1 20%, oCFX 20%, mCFX 30%, ostial Int Br  30%, distal AV groove CFX 30%, pRCA 30-40%, mRCA 99%, dRCA 30%, PL branch small with diffuse 40%. Unable to pass wire past RCA lesion->medically managed  . CHOLECYSTECTOMY N/A 12/05/2012   Procedure: LAPAROSCOPIC CHOLECYSTECTOMY ;  Surgeon: Edward Jolly, MD;  Location: [place];  Service: General;  Laterality: N/A;  . CHOLECYSTECTOMY    . CORONARY ANGIOPLASTY  1980, 1990  . CORONARY BALLOON ANGIOPLASTY N/A 08/31/2017   Procedure: CORONARY BALLOON ANGIOPLASTY;  Surgeon: Martinique, Peter M, MD;  Location: [place] [facility];  Service: Cardiovascular;  Laterality: N/A;  . EYE SURGERY     hx of cataract surgery   .  [place]  . HERNIA REPAIR     right inguinal hernia repair   . JOINT REPLACEMENT     left knee replacement, partial right  . LEFT HEART CATH AND CORONARY ANGIOGRAPHY N/A 08/31/2017   Procedure: LEFT HEART CATH AND CORONARY ANGIOGRAPHY;  Surgeon: Martinique, Peter M, MD;  Location: [place] [facility];  Service: Cardiovascular;  Laterality: N/A;  . LEFT HEART CATHETERIZATION WITH CORONARY ANGIOGRAM N/A 05/24/2012   Procedure: LEFT HEART CATHETERIZATION WITH CORONARY ANGIOGRAM;  Surgeon: Burnell Blanks, MD;  Location: [facility] CATH LAB;  Service: Cardiovascular;  Laterality: N/A;  . ORTHOPEDIC SURGERY    . OTHER SURGICAL HISTORY     right knee popliteal aneurysm surgery stent placed   . OTHER SURGICAL HISTORY    . PARTIAL KNEE ARTHROPLASTY  01/22/2012   Procedure: UNICOMPARTMENTAL KNEE;  Surgeon: Mauri Pole, MD;  Location: [facility];  Service: Orthopedics;  Laterality: Right;  . PERCUTANEOUS CORONARY INTERVENTION-BALLOON ONLY  05/24/2012   Procedure: PERCUTANEOUS CORONARY INTERVENTION-BALLOON ONLY;  Surgeon: Burnell Blanks, MD;  Location: [facility] CATH LAB;  Service: Cardiovascular;;  . RIGHT/LEFT HEART CATH AND CORONARY ANGIOGRAPHY N/A 05/12/2016   Procedure: Right/Left Heart Cath and Coronary Angiography;  Surgeon: Sherren Mocha, MD;  Location: [place] [facility];  Service: Cardiovascular;  Laterality: N/A;  . TEE WITHOUT CARDIOVERSION N/A 06/13/2016   Procedure: TRANSESOPHAGEAL ECHOCARDIOGRAM (TEE);  Surgeon: Sherren Mocha, MD;  Location: [place];  Service: Open Heart Surgery;  Laterality: N/A;  . TONSILLECTOMY    . TOTAL KNEE ARTHROPLASTY Left   . TRANSCATHETER AORTIC VALVE REPLACEMENT, TRANSFEMORAL N/A 06/13/2016   Procedure: TRANSCATHETER AORTIC VALVE REPLACEMENT, TRANSFEMORAL;  Surgeon: Sherren Mocha, MD;  Location: [place];  Service: Open Heart Surgery;  Laterality: N/A;    Current Medications: Current Meds  Medication Sig  . aspirin EC 81 MG tablet Take 81 mg by mouth  daily.  Marland Kitchen atorvastatin (LIPITOR) 20 MG tablet TAKE 1 TABLET ONCE DAILY.  . Calcium-Magnesium-Zinc (CAL-MAG-ZINC PO) Take 1 tablet by mouth daily.  . Cholecalciferol (VITAMIN D3) 5000 units TABS Take 1 tablet by mouth daily.  . Coenzyme Q10 (COQ10) 100 MG CAPS Take 1 tablet by mouth daily.  . fesoterodine (TOVIAZ) 8 MG TB24 tablet Take 8 mg by mouth every evening.  . furosemide (LASIX) 80 MG tablet TAKE 1 TABLET ONCE DAILY.  . isosorbide mononitrate (IMDUR) 60 MG 24 hr tablet TAKE (2) TABLETS BY MOUTH DAILY.  Marland Kitchen LORazepam (ATIVAN) 1 MG tablet Take 1 mg by mouth at bedtime.  . metoprolol succinate (TOPROL-XL) 25 MG 24 hr tablet Take 1 tablet (25 mg total) by mouth daily.  Marland Kitchen neomycin-polymyxin-hydrocortisone (CORTISPORIN) 3.5-10000-1 OTIC suspension   . nitroGLYCERIN (NITROSTAT) 0.4 MG SL tablet Place 0.4 mg under the tongue every 5 (five) minutes as needed for chest pain.  Marland Kitchen  Polyethyl Glycol-Propyl Glycol (SYSTANE OP) Place 2 drops into both eyes 2 (two) times daily.  . polyethylene glycol powder (MIRALAX) 17 GM/SCOOP powder Take 1 Container by mouth once.  . ramipril (ALTACE) 10 MG capsule Take 10 mg by mouth 2 (two) times daily.  . ranibizumab (LUCENTIS) 0.5 MG/0.05ML SOLN 0.5 mg by Intravitreal route See admin instructions. Every 9 weeks  . warfarin (COUMADIN) 5 MG tablet Take 5-7.5 mg by mouth See admin instructions. Take 1 and 1/2 tablets on Monday then take 1 tablet all the other days  . [DISCONTINUED] amLODipine (NORVASC) 5 MG tablet Take 1 tablet (5 mg total) by mouth daily.     Allergies:   No known allergies   Social History   Socioeconomic History  . Marital status: Widowed    Spouse name: Not on file  . Number of children: Not on file  . Years of education: Not on file  . Highest education level: Not on file  Occupational History  . Not on file  Tobacco Use  . Smoking status: Never Smoker  . Smokeless tobacco: Never Used  Vaping Use  . Vaping Use: Never used  Substance  and Sexual Activity  . Alcohol use: Not Currently    Comment: patient doesn't drink anymore  . Drug use: Never  . Sexual activity: Not on file  Other Topics Concern  . Not on file  Social History Narrative   ** Merged History Encounter **       [employer]; played football; score touchdown against [employer] Determinants of Health   Financial Resource Strain: Not on file  Food Insecurity: Not on file  Transportation Needs: Not on file  Physical Activity: Not on file  Stress: Not on file  Social Connections: Not on file     Family History: The patient's family history includes Heart attack (age of onset: 32) in his mother.  ROS:   Please see the history of present illness.    All other systems reviewed and are negative.  EKGs/Labs/Other Studies Reviewed:    EKG:  EKG is not ordered today.    Recent Labs: 01/13/2020: BUN 22; Creatinine, Ser 0.93; Hemoglobin 14.6; Platelets 133; Potassium 4.0; Sodium 137  Recent Lipid Panel    Component Value Date/Time   CHOL 149 08/28/2017 0350   TRIG 95 08/28/2017 0350   HDL 52 08/28/2017 0350   CHOLHDL 2.9 08/28/2017 0350   VLDL 19 08/28/2017 0350   LDLCALC 78 08/28/2017 0350            Physical Exam:    VS:  BP 130/76   Pulse 63   Ht 5\' 8"  (1.727 m)   Wt 189 lb 12.8 oz (86.1 kg)   SpO2 95%   BMI 28.86 kg/m     Wt Readings from Last 3 Encounters:  04/12/20 189 lb 12.8 oz (86.1 kg)  01/13/20 178 lb (80.7 kg)  10/07/19 180 lb (81.6 kg)     GEN: Elderly male in no acute distress HEENT: Normal NECK: No JVD; No carotid bruits LYMPHATICS: No lymphadenopathy CARDIAC: RRR, QIONGEXBM/8/4 systolic murmur at the right upper sternal border, no diastolic murmur RESPIRATORY:  Clear to auscultation without rales, wheezing or rhonchi  ABDOMEN: Soft, non-tender, non-distended MUSCULOSKELETAL: 1+ bilateral pretibial edema, compression stockings in place; No deformity  SKIN: Warm and dry NEUROLOGIC:  Alert and  oriented x 3 PSYCHIATRIC:  Normal affect   ASSESSMENT:    1. Permanent atrial fibrillation ([place])  2. Chronic diastolic CHF (congestive heart failure) ([place])   3. S/P TAVR (transcatheter aortic valve replacement)   4. Coronary artery disease involving native coronary artery of native heart with angina pectoris (HCC)   5. Leg swelling    PLAN:    In order of problems listed above:  1. Heart rate controlled, remains anticoagulated with warfarin. 2. Volume status appears stable.  He does have chronic leg edema and continues on diuretic therapy as well as compression stockings.  Functional class II symptoms at present. 3. Normal function of his transcatheter heart valve has been noted on serial echo studies. 4. Not currently experiencing angina.  He continues on medical therapy as outlined above. 5. We will hold amlodipine to see if his leg swelling is improved with this.  Continue leg elevation and compression stockings as tolerated.  Medication Adjustments/Labs and Tests Ordered: Current medicines are reviewed at length with the patient today.  Concerns regarding medicines are outlined above.  No orders of the defined types were placed in this encounter.  No orders of the defined types were placed in this encounter.   Patient Instructions  Medication Instructions:  1) STOP AMLODIPINE *If you need a refill on your cardiac medications before your next appointment, please call your pharmacy*   Follow-Up: You have an appointment with Dr. Burt Knack on July 21, 2020 at 2:00PM.    Signed, Sherren Mocha, MD  04/15/2020 5:48 PM    [place]

## 2020-04-15 DIAGNOSIS — H353231 Exudative age-related macular degeneration, bilateral, with active choroidal neovascularization: Secondary | ICD-10-CM | POA: Diagnosis not present

## 2020-04-30 ENCOUNTER — Ambulatory Visit (INDEPENDENT_AMBULATORY_CARE_PROVIDER_SITE_OTHER): Payer: Medicare Other | Admitting: *Deleted

## 2020-04-30 ENCOUNTER — Other Ambulatory Visit: Payer: Self-pay

## 2020-04-30 DIAGNOSIS — I4891 Unspecified atrial fibrillation: Secondary | ICD-10-CM

## 2020-04-30 DIAGNOSIS — Z5181 Encounter for therapeutic drug level monitoring: Secondary | ICD-10-CM

## 2020-04-30 DIAGNOSIS — I4821 Permanent atrial fibrillation: Secondary | ICD-10-CM

## 2020-04-30 LAB — POCT INR: INR: 3.7 — AB (ref 2.0–3.0)

## 2020-04-30 NOTE — Patient Instructions (Signed)
Description   Do not take any Warfarin today then continue taking 1 tablet daily.  Recheck INR in 3 weeks (normally 6 weeks). Call if placed on any new medications or if scheduled for any procedures. Coumadin clinic [phone].

## 2020-05-03 DIAGNOSIS — Z1159 Encounter for screening for other viral diseases: Secondary | ICD-10-CM | POA: Diagnosis not present

## 2020-05-03 DIAGNOSIS — Z20828 Contact with and (suspected) exposure to other viral communicable diseases: Secondary | ICD-10-CM | POA: Diagnosis not present

## 2020-05-06 DIAGNOSIS — S20419A Abrasion of unspecified back wall of thorax, initial encounter: Secondary | ICD-10-CM | POA: Diagnosis not present

## 2020-05-06 DIAGNOSIS — S51011A Laceration without foreign body of right elbow, initial encounter: Secondary | ICD-10-CM | POA: Diagnosis not present

## 2020-05-06 DIAGNOSIS — W19XXXA Unspecified fall, initial encounter: Secondary | ICD-10-CM | POA: Diagnosis not present

## 2020-05-10 DIAGNOSIS — Z1159 Encounter for screening for other viral diseases: Secondary | ICD-10-CM | POA: Diagnosis not present

## 2020-05-10 DIAGNOSIS — Z20828 Contact with and (suspected) exposure to other viral communicable diseases: Secondary | ICD-10-CM | POA: Diagnosis not present

## 2020-05-13 DIAGNOSIS — N182 Chronic kidney disease, stage 2 (mild): Secondary | ICD-10-CM | POA: Diagnosis not present

## 2020-05-13 DIAGNOSIS — Z7901 Long term (current) use of anticoagulants: Secondary | ICD-10-CM | POA: Diagnosis not present

## 2020-05-13 DIAGNOSIS — Z9181 History of falling: Secondary | ICD-10-CM | POA: Diagnosis not present

## 2020-05-13 DIAGNOSIS — E785 Hyperlipidemia, unspecified: Secondary | ICD-10-CM | POA: Diagnosis not present

## 2020-05-13 DIAGNOSIS — I130 Hypertensive heart and chronic kidney disease with heart failure and stage 1 through stage 4 chronic kidney disease, or unspecified chronic kidney disease: Secondary | ICD-10-CM | POA: Diagnosis not present

## 2020-05-13 DIAGNOSIS — Z7982 Long term (current) use of aspirin: Secondary | ICD-10-CM | POA: Diagnosis not present

## 2020-05-13 DIAGNOSIS — I739 Peripheral vascular disease, unspecified: Secondary | ICD-10-CM | POA: Diagnosis not present

## 2020-05-13 DIAGNOSIS — R609 Edema, unspecified: Secondary | ICD-10-CM | POA: Diagnosis not present

## 2020-05-13 DIAGNOSIS — I4891 Unspecified atrial fibrillation: Secondary | ICD-10-CM | POA: Diagnosis not present

## 2020-05-13 DIAGNOSIS — I509 Heart failure, unspecified: Secondary | ICD-10-CM | POA: Diagnosis not present

## 2020-05-13 DIAGNOSIS — S51811D Laceration without foreign body of right forearm, subsequent encounter: Secondary | ICD-10-CM | POA: Diagnosis not present

## 2020-05-13 DIAGNOSIS — I10 Essential (primary) hypertension: Secondary | ICD-10-CM | POA: Diagnosis not present

## 2020-05-13 DIAGNOSIS — F419 Anxiety disorder, unspecified: Secondary | ICD-10-CM | POA: Diagnosis not present

## 2020-05-13 DIAGNOSIS — S31010D Laceration without foreign body of lower back and pelvis without penetration into retroperitoneum, subsequent encounter: Secondary | ICD-10-CM | POA: Diagnosis not present

## 2020-05-13 DIAGNOSIS — Z87891 Personal history of nicotine dependence: Secondary | ICD-10-CM | POA: Diagnosis not present

## 2020-05-13 DIAGNOSIS — I482 Chronic atrial fibrillation, unspecified: Secondary | ICD-10-CM | POA: Diagnosis not present

## 2020-05-17 DIAGNOSIS — Z20828 Contact with and (suspected) exposure to other viral communicable diseases: Secondary | ICD-10-CM | POA: Diagnosis not present

## 2020-05-17 DIAGNOSIS — I739 Peripheral vascular disease, unspecified: Secondary | ICD-10-CM | POA: Diagnosis not present

## 2020-05-17 DIAGNOSIS — S51811D Laceration without foreign body of right forearm, subsequent encounter: Secondary | ICD-10-CM | POA: Diagnosis not present

## 2020-05-17 DIAGNOSIS — Z1159 Encounter for screening for other viral diseases: Secondary | ICD-10-CM | POA: Diagnosis not present

## 2020-05-17 DIAGNOSIS — Z7982 Long term (current) use of aspirin: Secondary | ICD-10-CM | POA: Diagnosis not present

## 2020-05-17 DIAGNOSIS — I10 Essential (primary) hypertension: Secondary | ICD-10-CM | POA: Diagnosis not present

## 2020-05-17 DIAGNOSIS — I4891 Unspecified atrial fibrillation: Secondary | ICD-10-CM | POA: Diagnosis not present

## 2020-05-17 DIAGNOSIS — S31010D Laceration without foreign body of lower back and pelvis without penetration into retroperitoneum, subsequent encounter: Secondary | ICD-10-CM | POA: Diagnosis not present

## 2020-05-19 DIAGNOSIS — I10 Essential (primary) hypertension: Secondary | ICD-10-CM | POA: Diagnosis not present

## 2020-05-19 DIAGNOSIS — S51811D Laceration without foreign body of right forearm, subsequent encounter: Secondary | ICD-10-CM | POA: Diagnosis not present

## 2020-05-19 DIAGNOSIS — S31010D Laceration without foreign body of lower back and pelvis without penetration into retroperitoneum, subsequent encounter: Secondary | ICD-10-CM | POA: Diagnosis not present

## 2020-05-19 DIAGNOSIS — Z7982 Long term (current) use of aspirin: Secondary | ICD-10-CM | POA: Diagnosis not present

## 2020-05-19 DIAGNOSIS — I739 Peripheral vascular disease, unspecified: Secondary | ICD-10-CM | POA: Diagnosis not present

## 2020-05-19 DIAGNOSIS — I4891 Unspecified atrial fibrillation: Secondary | ICD-10-CM | POA: Diagnosis not present

## 2020-05-20 DIAGNOSIS — S51811D Laceration without foreign body of right forearm, subsequent encounter: Secondary | ICD-10-CM | POA: Diagnosis not present

## 2020-05-20 DIAGNOSIS — I4891 Unspecified atrial fibrillation: Secondary | ICD-10-CM | POA: Diagnosis not present

## 2020-05-20 DIAGNOSIS — I10 Essential (primary) hypertension: Secondary | ICD-10-CM | POA: Diagnosis not present

## 2020-05-20 DIAGNOSIS — I739 Peripheral vascular disease, unspecified: Secondary | ICD-10-CM | POA: Diagnosis not present

## 2020-05-20 DIAGNOSIS — S31010D Laceration without foreign body of lower back and pelvis without penetration into retroperitoneum, subsequent encounter: Secondary | ICD-10-CM | POA: Diagnosis not present

## 2020-05-20 DIAGNOSIS — Z7982 Long term (current) use of aspirin: Secondary | ICD-10-CM | POA: Diagnosis not present

## 2020-05-21 ENCOUNTER — Other Ambulatory Visit: Payer: Self-pay

## 2020-05-21 ENCOUNTER — Ambulatory Visit (INDEPENDENT_AMBULATORY_CARE_PROVIDER_SITE_OTHER): Payer: Medicare Other | Admitting: *Deleted

## 2020-05-21 DIAGNOSIS — I4891 Unspecified atrial fibrillation: Secondary | ICD-10-CM

## 2020-05-21 DIAGNOSIS — Z5181 Encounter for therapeutic drug level monitoring: Secondary | ICD-10-CM

## 2020-05-21 LAB — POCT INR: INR: 2.8 (ref 2.0–3.0)

## 2020-05-21 NOTE — Patient Instructions (Signed)
Description   Continue taking 1 tablet daily.  Recheck INR in 5 weeks. Call if placed on any new medications or if scheduled for any procedures. Coumadin clinic [phone].

## 2020-05-24 DIAGNOSIS — S31010D Laceration without foreign body of lower back and pelvis without penetration into retroperitoneum, subsequent encounter: Secondary | ICD-10-CM | POA: Diagnosis not present

## 2020-05-24 DIAGNOSIS — Z1159 Encounter for screening for other viral diseases: Secondary | ICD-10-CM | POA: Diagnosis not present

## 2020-05-24 DIAGNOSIS — I10 Essential (primary) hypertension: Secondary | ICD-10-CM | POA: Diagnosis not present

## 2020-05-24 DIAGNOSIS — Z7982 Long term (current) use of aspirin: Secondary | ICD-10-CM | POA: Diagnosis not present

## 2020-05-24 DIAGNOSIS — I4891 Unspecified atrial fibrillation: Secondary | ICD-10-CM | POA: Diagnosis not present

## 2020-05-24 DIAGNOSIS — I739 Peripheral vascular disease, unspecified: Secondary | ICD-10-CM | POA: Diagnosis not present

## 2020-05-24 DIAGNOSIS — S51811D Laceration without foreign body of right forearm, subsequent encounter: Secondary | ICD-10-CM | POA: Diagnosis not present

## 2020-05-24 DIAGNOSIS — Z20828 Contact with and (suspected) exposure to other viral communicable diseases: Secondary | ICD-10-CM | POA: Diagnosis not present

## 2020-05-25 DIAGNOSIS — I4891 Unspecified atrial fibrillation: Secondary | ICD-10-CM | POA: Diagnosis not present

## 2020-05-25 DIAGNOSIS — Z7982 Long term (current) use of aspirin: Secondary | ICD-10-CM | POA: Diagnosis not present

## 2020-05-25 DIAGNOSIS — S51811D Laceration without foreign body of right forearm, subsequent encounter: Secondary | ICD-10-CM | POA: Diagnosis not present

## 2020-05-25 DIAGNOSIS — S31010D Laceration without foreign body of lower back and pelvis without penetration into retroperitoneum, subsequent encounter: Secondary | ICD-10-CM | POA: Diagnosis not present

## 2020-05-25 DIAGNOSIS — I739 Peripheral vascular disease, unspecified: Secondary | ICD-10-CM | POA: Diagnosis not present

## 2020-05-25 DIAGNOSIS — I10 Essential (primary) hypertension: Secondary | ICD-10-CM | POA: Diagnosis not present

## 2020-05-27 DIAGNOSIS — Z7982 Long term (current) use of aspirin: Secondary | ICD-10-CM | POA: Diagnosis not present

## 2020-05-27 DIAGNOSIS — S51811D Laceration without foreign body of right forearm, subsequent encounter: Secondary | ICD-10-CM | POA: Diagnosis not present

## 2020-05-27 DIAGNOSIS — I10 Essential (primary) hypertension: Secondary | ICD-10-CM | POA: Diagnosis not present

## 2020-05-27 DIAGNOSIS — I739 Peripheral vascular disease, unspecified: Secondary | ICD-10-CM | POA: Diagnosis not present

## 2020-05-27 DIAGNOSIS — H353231 Exudative age-related macular degeneration, bilateral, with active choroidal neovascularization: Secondary | ICD-10-CM | POA: Diagnosis not present

## 2020-05-27 DIAGNOSIS — S31010D Laceration without foreign body of lower back and pelvis without penetration into retroperitoneum, subsequent encounter: Secondary | ICD-10-CM | POA: Diagnosis not present

## 2020-05-27 DIAGNOSIS — I4891 Unspecified atrial fibrillation: Secondary | ICD-10-CM | POA: Diagnosis not present

## 2020-05-31 DIAGNOSIS — Z952 Presence of prosthetic heart valve: Secondary | ICD-10-CM | POA: Diagnosis not present

## 2020-05-31 DIAGNOSIS — Z7901 Long term (current) use of anticoagulants: Secondary | ICD-10-CM | POA: Diagnosis not present

## 2020-05-31 DIAGNOSIS — Z1159 Encounter for screening for other viral diseases: Secondary | ICD-10-CM | POA: Diagnosis not present

## 2020-05-31 DIAGNOSIS — I509 Heart failure, unspecified: Secondary | ICD-10-CM | POA: Diagnosis not present

## 2020-05-31 DIAGNOSIS — Z20828 Contact with and (suspected) exposure to other viral communicable diseases: Secondary | ICD-10-CM | POA: Diagnosis not present

## 2020-05-31 DIAGNOSIS — I10 Essential (primary) hypertension: Secondary | ICD-10-CM | POA: Diagnosis not present

## 2020-05-31 DIAGNOSIS — E663 Overweight: Secondary | ICD-10-CM | POA: Diagnosis not present

## 2020-06-01 DIAGNOSIS — I739 Peripheral vascular disease, unspecified: Secondary | ICD-10-CM | POA: Diagnosis not present

## 2020-06-01 DIAGNOSIS — S51811D Laceration without foreign body of right forearm, subsequent encounter: Secondary | ICD-10-CM | POA: Diagnosis not present

## 2020-06-01 DIAGNOSIS — S31010D Laceration without foreign body of lower back and pelvis without penetration into retroperitoneum, subsequent encounter: Secondary | ICD-10-CM | POA: Diagnosis not present

## 2020-06-01 DIAGNOSIS — I4891 Unspecified atrial fibrillation: Secondary | ICD-10-CM | POA: Diagnosis not present

## 2020-06-01 DIAGNOSIS — I10 Essential (primary) hypertension: Secondary | ICD-10-CM | POA: Diagnosis not present

## 2020-06-01 DIAGNOSIS — Z7982 Long term (current) use of aspirin: Secondary | ICD-10-CM | POA: Diagnosis not present

## 2020-06-02 DIAGNOSIS — S51811D Laceration without foreign body of right forearm, subsequent encounter: Secondary | ICD-10-CM | POA: Diagnosis not present

## 2020-06-02 DIAGNOSIS — I4891 Unspecified atrial fibrillation: Secondary | ICD-10-CM | POA: Diagnosis not present

## 2020-06-02 DIAGNOSIS — I739 Peripheral vascular disease, unspecified: Secondary | ICD-10-CM | POA: Diagnosis not present

## 2020-06-02 DIAGNOSIS — S31010D Laceration without foreign body of lower back and pelvis without penetration into retroperitoneum, subsequent encounter: Secondary | ICD-10-CM | POA: Diagnosis not present

## 2020-06-02 DIAGNOSIS — Z7982 Long term (current) use of aspirin: Secondary | ICD-10-CM | POA: Diagnosis not present

## 2020-06-02 DIAGNOSIS — I10 Essential (primary) hypertension: Secondary | ICD-10-CM | POA: Diagnosis not present

## 2020-06-04 DIAGNOSIS — Z7982 Long term (current) use of aspirin: Secondary | ICD-10-CM | POA: Diagnosis not present

## 2020-06-04 DIAGNOSIS — S31010D Laceration without foreign body of lower back and pelvis without penetration into retroperitoneum, subsequent encounter: Secondary | ICD-10-CM | POA: Diagnosis not present

## 2020-06-04 DIAGNOSIS — I739 Peripheral vascular disease, unspecified: Secondary | ICD-10-CM | POA: Diagnosis not present

## 2020-06-04 DIAGNOSIS — I4891 Unspecified atrial fibrillation: Secondary | ICD-10-CM | POA: Diagnosis not present

## 2020-06-04 DIAGNOSIS — I10 Essential (primary) hypertension: Secondary | ICD-10-CM | POA: Diagnosis not present

## 2020-06-04 DIAGNOSIS — S51811D Laceration without foreign body of right forearm, subsequent encounter: Secondary | ICD-10-CM | POA: Diagnosis not present

## 2020-06-08 DIAGNOSIS — S31010D Laceration without foreign body of lower back and pelvis without penetration into retroperitoneum, subsequent encounter: Secondary | ICD-10-CM | POA: Diagnosis not present

## 2020-06-08 DIAGNOSIS — Z7982 Long term (current) use of aspirin: Secondary | ICD-10-CM | POA: Diagnosis not present

## 2020-06-08 DIAGNOSIS — S51811D Laceration without foreign body of right forearm, subsequent encounter: Secondary | ICD-10-CM | POA: Diagnosis not present

## 2020-06-08 DIAGNOSIS — I10 Essential (primary) hypertension: Secondary | ICD-10-CM | POA: Diagnosis not present

## 2020-06-08 DIAGNOSIS — I4891 Unspecified atrial fibrillation: Secondary | ICD-10-CM | POA: Diagnosis not present

## 2020-06-08 DIAGNOSIS — I739 Peripheral vascular disease, unspecified: Secondary | ICD-10-CM | POA: Diagnosis not present

## 2020-06-09 DIAGNOSIS — S31010D Laceration without foreign body of lower back and pelvis without penetration into retroperitoneum, subsequent encounter: Secondary | ICD-10-CM | POA: Diagnosis not present

## 2020-06-09 DIAGNOSIS — S51811D Laceration without foreign body of right forearm, subsequent encounter: Secondary | ICD-10-CM | POA: Diagnosis not present

## 2020-06-09 DIAGNOSIS — I10 Essential (primary) hypertension: Secondary | ICD-10-CM | POA: Diagnosis not present

## 2020-06-09 DIAGNOSIS — I4891 Unspecified atrial fibrillation: Secondary | ICD-10-CM | POA: Diagnosis not present

## 2020-06-09 DIAGNOSIS — Z7982 Long term (current) use of aspirin: Secondary | ICD-10-CM | POA: Diagnosis not present

## 2020-06-09 DIAGNOSIS — I739 Peripheral vascular disease, unspecified: Secondary | ICD-10-CM | POA: Diagnosis not present

## 2020-06-11 ENCOUNTER — Inpatient Hospital Stay (HOSPITAL_COMMUNITY)
Admission: EM | Admit: 2020-06-11 | Discharge: 2020-06-21 | DRG: 291 | Disposition: A | Discharge status: Home Health | Payer: Medicare Other | Source: Skilled Nursing Facility | Attending: Internal Medicine | Admitting: Internal Medicine

## 2020-06-11 DIAGNOSIS — Z9114 Patient's other noncompliance with medication regimen: Secondary | ICD-10-CM

## 2020-06-11 DIAGNOSIS — I4891 Unspecified atrial fibrillation: Secondary | ICD-10-CM | POA: Diagnosis not present

## 2020-06-11 DIAGNOSIS — I509 Heart failure, unspecified: Secondary | ICD-10-CM | POA: Diagnosis not present

## 2020-06-11 DIAGNOSIS — I700 Atherosclerosis of aorta: Secondary | ICD-10-CM | POA: Diagnosis not present

## 2020-06-11 DIAGNOSIS — D6959 Other secondary thrombocytopenia: Secondary | ICD-10-CM | POA: Diagnosis present

## 2020-06-11 DIAGNOSIS — R0689 Other abnormalities of breathing: Secondary | ICD-10-CM | POA: Diagnosis not present

## 2020-06-11 DIAGNOSIS — N390 Urinary tract infection, site not specified: Secondary | ICD-10-CM | POA: Diagnosis present

## 2020-06-11 DIAGNOSIS — I4821 Permanent atrial fibrillation: Secondary | ICD-10-CM | POA: Diagnosis present

## 2020-06-11 DIAGNOSIS — R32 Unspecified urinary incontinence: Secondary | ICD-10-CM | POA: Diagnosis present

## 2020-06-11 DIAGNOSIS — I248 Other forms of acute ischemic heart disease: Secondary | ICD-10-CM | POA: Diagnosis present

## 2020-06-11 DIAGNOSIS — I4510 Unspecified right bundle-branch block: Secondary | ICD-10-CM | POA: Diagnosis not present

## 2020-06-11 DIAGNOSIS — K219 Gastro-esophageal reflux disease without esophagitis: Secondary | ICD-10-CM | POA: Diagnosis present

## 2020-06-11 DIAGNOSIS — J1008 Influenza due to other identified influenza virus with other specified pneumonia: Secondary | ICD-10-CM | POA: Diagnosis not present

## 2020-06-11 DIAGNOSIS — Z96652 Presence of left artificial knee joint: Secondary | ICD-10-CM | POA: Diagnosis present

## 2020-06-11 DIAGNOSIS — I5043 Acute on chronic combined systolic (congestive) and diastolic (congestive) heart failure: Secondary | ICD-10-CM | POA: Diagnosis not present

## 2020-06-11 DIAGNOSIS — I2510 Atherosclerotic heart disease of native coronary artery without angina pectoris: Secondary | ICD-10-CM | POA: Diagnosis present

## 2020-06-11 DIAGNOSIS — Z20822 Contact with and (suspected) exposure to covid-19: Secondary | ICD-10-CM | POA: Diagnosis present

## 2020-06-11 DIAGNOSIS — Z86711 Personal history of pulmonary embolism: Secondary | ICD-10-CM

## 2020-06-11 DIAGNOSIS — Z7901 Long term (current) use of anticoagulants: Secondary | ICD-10-CM

## 2020-06-11 DIAGNOSIS — R791 Abnormal coagulation profile: Secondary | ICD-10-CM | POA: Diagnosis present

## 2020-06-11 DIAGNOSIS — T502X5A Adverse effect of carbonic-anhydrase inhibitors, benzothiadiazides and other diuretics, initial encounter: Secondary | ICD-10-CM | POA: Diagnosis present

## 2020-06-11 DIAGNOSIS — Z85828 Personal history of other malignant neoplasm of skin: Secondary | ICD-10-CM

## 2020-06-11 DIAGNOSIS — I110 Hypertensive heart disease with heart failure: Secondary | ICD-10-CM | POA: Diagnosis not present

## 2020-06-11 DIAGNOSIS — I420 Dilated cardiomyopathy: Secondary | ICD-10-CM

## 2020-06-11 DIAGNOSIS — R0902 Hypoxemia: Secondary | ICD-10-CM | POA: Diagnosis not present

## 2020-06-11 DIAGNOSIS — N401 Enlarged prostate with lower urinary tract symptoms: Secondary | ICD-10-CM | POA: Diagnosis present

## 2020-06-11 DIAGNOSIS — I482 Chronic atrial fibrillation, unspecified: Secondary | ICD-10-CM

## 2020-06-11 DIAGNOSIS — Z79899 Other long term (current) drug therapy: Secondary | ICD-10-CM

## 2020-06-11 DIAGNOSIS — Z9861 Coronary angioplasty status: Secondary | ICD-10-CM

## 2020-06-11 DIAGNOSIS — Z7982 Long term (current) use of aspirin: Secondary | ICD-10-CM

## 2020-06-11 DIAGNOSIS — E785 Hyperlipidemia, unspecified: Secondary | ICD-10-CM | POA: Diagnosis present

## 2020-06-11 DIAGNOSIS — I739 Peripheral vascular disease, unspecified: Secondary | ICD-10-CM | POA: Diagnosis present

## 2020-06-11 DIAGNOSIS — Z953 Presence of xenogenic heart valve: Secondary | ICD-10-CM

## 2020-06-11 DIAGNOSIS — J101 Influenza due to other identified influenza virus with other respiratory manifestations: Secondary | ICD-10-CM

## 2020-06-11 DIAGNOSIS — R3915 Urgency of urination: Secondary | ICD-10-CM | POA: Diagnosis present

## 2020-06-11 DIAGNOSIS — R310 Gross hematuria: Secondary | ICD-10-CM

## 2020-06-11 DIAGNOSIS — I10 Essential (primary) hypertension: Secondary | ICD-10-CM | POA: Diagnosis not present

## 2020-06-11 DIAGNOSIS — R0602 Shortness of breath: Secondary | ICD-10-CM | POA: Diagnosis not present

## 2020-06-11 DIAGNOSIS — E7800 Pure hypercholesterolemia, unspecified: Secondary | ICD-10-CM | POA: Diagnosis present

## 2020-06-11 DIAGNOSIS — E876 Hypokalemia: Secondary | ICD-10-CM | POA: Diagnosis present

## 2020-06-11 MED ORDER — FUROSEMIDE 10 MG/ML IJ SOLN
40.0000 mg | Freq: Once | INTRAMUSCULAR | Status: AC
  Administered 2020-06-12: 40 mg via INTRAVENOUS
  Filled 2020-06-11: qty 4

## 2020-06-11 NOTE — ED Provider Notes (Signed)
[facility] EMERGENCY DEPARTMENT Provider Note   CSN: [id number] Arrival date & time: 06/11/20  2336     History No chief complaint on file.   Tyler Williams is a 87 y.o. male.  Patient is a 87 year old male with extensive past medical history including coronary artery disease with prior angioplasty, atrial fibrillation, pulmonary embolism, hypertension, CHF.  Patient on Coumadin for anticoagulation.  He presents today by EMS for evaluation of swelling and shortness of breath.  This is worsened over the past several days.  He has swelling noted to his legs all the way up through his scrotum and was referred for evaluation of this.  He also has rattling in his chest.  Patient denies fevers or chills.  He denies to me he is experiencing pain.  The history is provided by the patient.       Past Medical History:  Diagnosis Date  . A-fib ([place])   . AAA (abdominal aortic aneurysm) ([place])    s/p repair  . Aortic stenosis, mild    a. mean gradient 17 mmHg on 04/2012 TTE  . Arthritis    knees  . CAD (coronary artery disease)    a. s/p multiple percutaneous prior coronary artery interventions b. 05/2012: unsuccessful PCI to tight RCA->medically managed  . Cancer [facility])    skin cancer removed, right hand, left leg, chest  . Carotid artery disease ([place])    0-39% bilateral ICA stenoses 11/2011  . Chronic anticoagulation    Coumadin  . Coronary artery disease   . GERD (gastroesophageal reflux disease)   . GERD (gastroesophageal reflux disease)   . History of pulmonary embolism    1964 related to dislocated hip on right   . History of skin cancer   . Hypercholesteremia   . Hypertension   . Incidental cecal mass noted on CT imaging 05/24/2016   **An incidental finding of potential clinical significance has been found. 4.6 x 5.6 x 3.7 cm masslike lesion in the cecum adjacent to the ileocecal valve may represent a large polyp or colonic neoplasm. Correlation with  nonemergent colonoscopy is strongly recommended in the near future to better evaluate this finding.**  . Macular degeneration    eye injections  . Microscopic hematuria    Followed by urology  . Permanent atrial fibrillation ([place])   . PVD (peripheral vascular disease) ([place])    Bilateral renal artery atherosclerosis, SMA stenosis with collateralization  . RBBB    Chronic  . 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 SSS (sick sinus syndrome) [facility])     Patient Active Problem List   Diagnosis Date Noted  . Aorta aneurysm ([place]) 02/14/2018  . Chronic diastolic CHF (congestive heart failure) ([place]) 09/25/2017  . Precordial chest pain 08/28/2017  . HLD (hyperlipidemia) 08/28/2017  . Anticoagulated on Coumadin 08/28/2017  . HTN (hypertension) 08/28/2017  . S/P TAVR (transcatheter aortic valve replacement)   . 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 Incidental cecal mass noted on CT imaging 05/24/2016  . Exudative age-related macular 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 ([place]) 12/22/2014  . Encounter for therapeutic drug monitoring 03/20/2013  . Pseudophakia of both eyes 03/19/2013  . Macular degeneration 03/19/2013  . Cholecystitis, acute 12/06/2012  . Chest pain, atypical 12/03/2012  . Hematuria, microscopic 12/03/2012  . Cystoid macular edema 05/14/2012  . Overweight (BMI 25.0-29.9) 01/23/2012  .  Hyponatremia 01/23/2012  . S/P right UKR 01/22/2012  . Carotid artery bruit 11/21/2011  . Carotid bruit 11/21/2011  . Presence of intraocular lens 02/16/2011  . EDEMA 07/14/2009  . Essential hypertension 01/27/2009  . HYPERCHOLESTEROLEMIA  IIA 10/21/2008  . Coronary artery disease involving native coronary artery with angina pectoris ([place]) 10/21/2008  . Permanent atrial fibrillation ([place]) 10/21/2008    Past  Surgical History:  Procedure Laterality Date  . ABDOMINAL AORTIC ANEURYSM REPAIR     1993   . CARDIAC CATHETERIZATION  05/24/2012   pD1 20%, oCFX 20%, mCFX 30%, ostial Int Br 30%, distal AV groove CFX 30%, pRCA 30-40%, mRCA 99%, dRCA 30%, PL branch small with diffuse 40%. Unable to pass wire past RCA lesion->medically managed  . CHOLECYSTECTOMY N/A 12/05/2012   Procedure: LAPAROSCOPIC CHOLECYSTECTOMY ;  Surgeon: Edward Jolly, MD;  Location: [place];  Service: General;  Laterality: N/A;  . CHOLECYSTECTOMY    . CORONARY ANGIOPLASTY  1980, 1990  . CORONARY BALLOON ANGIOPLASTY N/A 08/31/2017   Procedure: CORONARY BALLOON ANGIOPLASTY;  Surgeon: Martinique, Peter M, MD;  Location: [place] CV LAB;  Service: Cardiovascular;  Laterality: N/A;  . EYE SURGERY     hx of cataract surgery   . [place]  . HERNIA REPAIR     right inguinal hernia repair   . JOINT REPLACEMENT     left knee replacement, partial right  . LEFT HEART CATH AND CORONARY ANGIOGRAPHY N/A 08/31/2017   Procedure: LEFT HEART CATH AND CORONARY ANGIOGRAPHY;  Surgeon: Martinique, Peter M, MD;  Location: [place] CV LAB;  Service: Cardiovascular;  Laterality: N/A;  . LEFT HEART CATHETERIZATION WITH CORONARY ANGIOGRAM N/A 05/24/2012   Procedure: LEFT HEART CATHETERIZATION WITH CORONARY ANGIOGRAM;  Surgeon: Burnell Blanks, MD;  Location: [facility] CATH LAB;  Service: Cardiovascular;  Laterality: N/A;  . ORTHOPEDIC SURGERY    . OTHER SURGICAL HISTORY     right knee popliteal aneurysm surgery stent placed   . OTHER SURGICAL HISTORY    . PARTIAL KNEE ARTHROPLASTY  01/22/2012   Procedure: UNICOMPARTMENTAL KNEE;  Surgeon: Mauri Pole, MD;  Location: [facility];  Service: Orthopedics;  Laterality: Right;  . PERCUTANEOUS CORONARY INTERVENTION-BALLOON ONLY  05/24/2012   Procedure: PERCUTANEOUS CORONARY INTERVENTION-BALLOON ONLY;  Surgeon: Burnell Blanks, MD;  Location: [facility] CATH LAB;  Service: Cardiovascular;;  .  RIGHT/LEFT HEART CATH AND CORONARY ANGIOGRAPHY N/A 05/12/2016   Procedure: Right/Left Heart Cath and Coronary Angiography;  Surgeon: Sherren Mocha, MD;  Location: [place] CV LAB;  Service: Cardiovascular;  Laterality: N/A;  . TEE WITHOUT CARDIOVERSION N/A 06/13/2016   Procedure: TRANSESOPHAGEAL ECHOCARDIOGRAM (TEE);  Surgeon: Sherren Mocha, MD;  Location: [place];  Service: Open Heart Surgery;  Laterality: N/A;  . TONSILLECTOMY    . TOTAL KNEE ARTHROPLASTY Left   . TRANSCATHETER AORTIC VALVE REPLACEMENT, TRANSFEMORAL N/A 06/13/2016   Procedure: TRANSCATHETER AORTIC VALVE REPLACEMENT, TRANSFEMORAL;  Surgeon: Sherren Mocha, MD;  Location: [place];  Service: Open Heart Surgery;  Laterality: N/A;       Family History  Problem Relation Age of Onset  . Heart attack Mother 38    Social History   Tobacco Use  . Smoking status: Never Smoker  . Smokeless tobacco: Never Used  Vaping Use  . Vaping Use: Never used  Substance Use Topics  . Alcohol use: Not Currently    Comment: patient doesn't drink anymore  . Drug use: Never    Home Medications Prior to Admission  medications   Medication Sig Start Date End Date Taking? Authorizing Provider  aspirin EC 81 MG tablet Take 81 mg by mouth daily.    [provider]  atorvastatin (LIPITOR) 20 MG tablet TAKE 1 TABLET ONCE DAILY. 04/09/20   Sherren Mocha, MD  Calcium-Magnesium-Zinc (CAL-MAG-ZINC PO) Take 1 tablet by mouth daily.    [provider]  Cholecalciferol (VITAMIN D3) 5000 units TABS Take 1 tablet by mouth daily.    [provider]  Coenzyme Q10 (COQ10) 100 MG CAPS Take 1 tablet by mouth daily.    [provider]  fesoterodine (TOVIAZ) 8 MG TB24 tablet Take 8 mg by mouth every evening.    [provider]  furosemide (LASIX) 80 MG tablet TAKE 1 TABLET ONCE DAILY. 01/12/20   Sherren Mocha, MD  isosorbide mononitrate (IMDUR) 60 MG 24 hr tablet TAKE (2) TABLETS BY MOUTH DAILY. 04/06/20   Sherren Mocha, MD  LORazepam (ATIVAN) 1 MG tablet Take 1 mg by mouth at bedtime.    [provider]  metoprolol succinate (TOPROL-XL) 25 MG 24 hr tablet Take 1 tablet (25 mg total) by mouth daily. 10/10/19   [name] T, PA-C  neomycin-polymyxin-hydrocortisone (CORTISPORIN) 3.5-10000-1 OTIC suspension  09/16/19   [provider]  nitroGLYCERIN (NITROSTAT) 0.4 MG SL tablet Place 0.4 mg under the tongue every 5 (five) minutes as needed for chest pain.    [provider]  Polyethyl Glycol-Propyl Glycol (SYSTANE OP) Place 2 drops into both eyes 2 (two) times daily.    [provider]  polyethylene glycol powder (MIRALAX) 17 GM/SCOOP powder Take 1 Container by mouth once.    [provider]  ramipril (ALTACE) 10 MG capsule Take 10 mg by mouth 2 (two) times daily.    [provider]  ranibizumab (LUCENTIS) 0.5 MG/0.05ML SOLN 0.5 mg by Intravitreal route See admin instructions. Every 9 weeks    [provider]  warfarin (COUMADIN) 5 MG tablet Take 5-7.5 mg by mouth See admin instructions. Take 1 and 1/2 tablets on Monday then take 1 tablet all the other days    [provider]    Allergies    No known allergies  Review of Systems   Review of Systems  All other systems reviewed and are negative.   Physical Exam Updated Vital Signs BP (!) 156/64 (BP Location: Left Arm)   Pulse 70   Temp 98.8 F (37.1 C) (Oral)   Resp 18   SpO2 95%   Physical Exam Vitals and nursing note reviewed.  Constitutional:      General: He is not in acute distress.    Appearance: He is well-developed. He is not diaphoretic.  HENT:     Head: Normocephalic and atraumatic.  Cardiovascular:     Rate and Rhythm: Normal rate and regular rhythm.     Heart sounds: No murmur heard. No friction rub.  Pulmonary:     Effort: Pulmonary effort is normal. No respiratory distress.     Breath sounds: Rales present. No wheezing.     Comments: There are rales in  the bases bilaterally. Abdominal:     General: Bowel sounds are normal. There is no distension.     Palpations: Abdomen is soft.     Tenderness: There is no abdominal tenderness.  Musculoskeletal:        General: Normal range of motion.     Cervical back: Normal range of motion and neck supple.     Right lower leg:  Edema present.     Left lower leg: Edema present.     Comments: There is 3+ pitting edema of both lower extremities extending above the knees and into the thighs.  Skin:    General: Skin is warm and dry.  Neurological:     Mental Status: He is alert and oriented to person, place, and time.     Coordination: Coordination normal.     ED Results / Procedures / Treatments   Labs (all labs ordered are listed, but only abnormal results are displayed) Labs Reviewed  RESP PANEL BY RT-PCR (FLU A&B, COVID) [id number]  COMPREHENSIVE METABOLIC PANEL  CBC WITH DIFFERENTIAL/PLATELET  BRAIN NATRIURETIC PEPTIDE  URINALYSIS, ROUTINE W REFLEX MICROSCOPIC  PROTIME-INR  TROPONIN I (HIGH SENSITIVITY)    EKG EKG Interpretation  Date/Time:  Saturday June 12 2020 00:09:27 EDT Ventricular Rate:  59 PR Interval:    QRS Duration: 155 QT Interval:  496 QTC Calculation: 492 R Axis:   119 Text Interpretation: Atrial fibrillation Right bundle branch block Borderline ST depression, lateral leads No significant change since 06/14/2016 Confirmed by Veryl Speak [phone]) on 06/12/2020 12:17:01 AM   Radiology No results found.  Procedures Procedures   Medications Ordered in ED Medications  furosemide (LASIX) injection 40 mg (has no administration in time range)    ED Course  I have reviewed the triage vital signs and the nursing notes.  Pertinent labs & imaging results that were available during my care of the patient were reviewed by me and considered in my medical decision making (see chart for details).    MDM Rules/Calculators/A&P  Patient is a 87 year old male with history of  CHF and multiple other medical issues presenting with increased swelling and difficulty breathing over the past several days.  Patient arrives here with audible rales and presentation most consistent with CHF.  It sounds as though he has been taking less diuretic than normal because he does not like to get up to go to the bathroom.  His work-up is consistent with CHF exacerbation.  He is also tested positive for influenza A.  His troponin is mildly elevated at 100, likely demand ischemia.  Patient will be admitted for diuresis and further trending of his troponin.  He also has an INR of 7.7, but has no signs of active bleeding.  CRITICAL CARE Performed by: Veryl Speak Total critical care time: 35 minutes Critical care time was exclusive of separately billable procedures and treating other patients. Critical care was necessary to treat or prevent imminent or life-threatening deterioration. Critical care was time spent personally by me on the following activities: development of treatment plan with patient and/or surrogate as well as nursing, discussions with consultants, evaluation of patient's response to treatment, examination of patient, obtaining history from patient or surrogate, ordering and performing treatments and interventions, ordering and review of laboratory studies, ordering and review of radiographic studies, pulse oximetry and re-evaluation of patient's condition.   Final Clinical Impression(s) / ED Diagnoses Final diagnoses:  None    Rx / DC Orders ED Discharge Orders    None       Veryl Speak, MD 06/12/20 [phone]

## 2020-06-12 ENCOUNTER — Inpatient Hospital Stay (HOSPITAL_COMMUNITY): Payer: Medicare Other

## 2020-06-12 ENCOUNTER — Other Ambulatory Visit (HOSPITAL_COMMUNITY): Payer: Medicare Other

## 2020-06-12 ENCOUNTER — Emergency Department (HOSPITAL_COMMUNITY): Payer: Medicare Other

## 2020-06-12 ENCOUNTER — Other Ambulatory Visit: Payer: Self-pay

## 2020-06-12 ENCOUNTER — Encounter (HOSPITAL_COMMUNITY): Payer: Self-pay

## 2020-06-12 DIAGNOSIS — S51811D Laceration without foreign body of right forearm, subsequent encounter: Secondary | ICD-10-CM | POA: Diagnosis not present

## 2020-06-12 DIAGNOSIS — Z85828 Personal history of other malignant neoplasm of skin: Secondary | ICD-10-CM | POA: Diagnosis not present

## 2020-06-12 DIAGNOSIS — I2510 Atherosclerotic heart disease of native coronary artery without angina pectoris: Secondary | ICD-10-CM | POA: Diagnosis present

## 2020-06-12 DIAGNOSIS — E876 Hypokalemia: Secondary | ICD-10-CM | POA: Diagnosis present

## 2020-06-12 DIAGNOSIS — I5043 Acute on chronic combined systolic (congestive) and diastolic (congestive) heart failure: Secondary | ICD-10-CM | POA: Diagnosis present

## 2020-06-12 DIAGNOSIS — Z86711 Personal history of pulmonary embolism: Secondary | ICD-10-CM | POA: Diagnosis not present

## 2020-06-12 DIAGNOSIS — J90 Pleural effusion, not elsewhere classified: Secondary | ICD-10-CM | POA: Diagnosis not present

## 2020-06-12 DIAGNOSIS — R32 Unspecified urinary incontinence: Secondary | ICD-10-CM | POA: Diagnosis present

## 2020-06-12 DIAGNOSIS — R3915 Urgency of urination: Secondary | ICD-10-CM | POA: Diagnosis present

## 2020-06-12 DIAGNOSIS — R791 Abnormal coagulation profile: Secondary | ICD-10-CM | POA: Diagnosis present

## 2020-06-12 DIAGNOSIS — N182 Chronic kidney disease, stage 2 (mild): Secondary | ICD-10-CM | POA: Diagnosis not present

## 2020-06-12 DIAGNOSIS — S31010D Laceration without foreign body of lower back and pelvis without penetration into retroperitoneum, subsequent encounter: Secondary | ICD-10-CM | POA: Diagnosis not present

## 2020-06-12 DIAGNOSIS — Z79899 Other long term (current) drug therapy: Secondary | ICD-10-CM | POA: Diagnosis not present

## 2020-06-12 DIAGNOSIS — R310 Gross hematuria: Secondary | ICD-10-CM | POA: Diagnosis not present

## 2020-06-12 DIAGNOSIS — I10 Essential (primary) hypertension: Secondary | ICD-10-CM | POA: Diagnosis not present

## 2020-06-12 DIAGNOSIS — Z9114 Patient's other noncompliance with medication regimen: Secondary | ICD-10-CM | POA: Diagnosis not present

## 2020-06-12 DIAGNOSIS — Z7901 Long term (current) use of anticoagulants: Secondary | ICD-10-CM | POA: Diagnosis not present

## 2020-06-12 DIAGNOSIS — E7800 Pure hypercholesterolemia, unspecified: Secondary | ICD-10-CM | POA: Diagnosis present

## 2020-06-12 DIAGNOSIS — I5041 Acute combined systolic (congestive) and diastolic (congestive) heart failure: Secondary | ICD-10-CM | POA: Diagnosis not present

## 2020-06-12 DIAGNOSIS — Z9181 History of falling: Secondary | ICD-10-CM | POA: Diagnosis not present

## 2020-06-12 DIAGNOSIS — I4891 Unspecified atrial fibrillation: Secondary | ICD-10-CM | POA: Diagnosis not present

## 2020-06-12 DIAGNOSIS — I700 Atherosclerosis of aorta: Secondary | ICD-10-CM | POA: Diagnosis not present

## 2020-06-12 DIAGNOSIS — N390 Urinary tract infection, site not specified: Secondary | ICD-10-CM | POA: Diagnosis present

## 2020-06-12 DIAGNOSIS — J1008 Influenza due to other identified influenza virus with other specified pneumonia: Secondary | ICD-10-CM | POA: Diagnosis present

## 2020-06-12 DIAGNOSIS — I482 Chronic atrial fibrillation, unspecified: Secondary | ICD-10-CM | POA: Diagnosis not present

## 2020-06-12 DIAGNOSIS — Z20822 Contact with and (suspected) exposure to covid-19: Secondary | ICD-10-CM | POA: Diagnosis present

## 2020-06-12 DIAGNOSIS — Z7982 Long term (current) use of aspirin: Secondary | ICD-10-CM | POA: Diagnosis not present

## 2020-06-12 DIAGNOSIS — K219 Gastro-esophageal reflux disease without esophagitis: Secondary | ICD-10-CM | POA: Diagnosis present

## 2020-06-12 DIAGNOSIS — I5021 Acute systolic (congestive) heart failure: Secondary | ICD-10-CM | POA: Diagnosis not present

## 2020-06-12 DIAGNOSIS — I420 Dilated cardiomyopathy: Secondary | ICD-10-CM | POA: Diagnosis present

## 2020-06-12 DIAGNOSIS — I5031 Acute diastolic (congestive) heart failure: Secondary | ICD-10-CM

## 2020-06-12 DIAGNOSIS — I509 Heart failure, unspecified: Secondary | ICD-10-CM | POA: Diagnosis not present

## 2020-06-12 DIAGNOSIS — I248 Other forms of acute ischemic heart disease: Secondary | ICD-10-CM | POA: Diagnosis present

## 2020-06-12 DIAGNOSIS — J101 Influenza due to other identified influenza virus with other respiratory manifestations: Secondary | ICD-10-CM | POA: Diagnosis not present

## 2020-06-12 DIAGNOSIS — I5023 Acute on chronic systolic (congestive) heart failure: Secondary | ICD-10-CM | POA: Diagnosis not present

## 2020-06-12 DIAGNOSIS — I130 Hypertensive heart and chronic kidney disease with heart failure and stage 1 through stage 4 chronic kidney disease, or unspecified chronic kidney disease: Secondary | ICD-10-CM | POA: Diagnosis not present

## 2020-06-12 DIAGNOSIS — I110 Hypertensive heart disease with heart failure: Secondary | ICD-10-CM | POA: Diagnosis present

## 2020-06-12 DIAGNOSIS — Z96652 Presence of left artificial knee joint: Secondary | ICD-10-CM | POA: Diagnosis present

## 2020-06-12 DIAGNOSIS — I739 Peripheral vascular disease, unspecified: Secondary | ICD-10-CM | POA: Diagnosis present

## 2020-06-12 DIAGNOSIS — R0602 Shortness of breath: Secondary | ICD-10-CM | POA: Diagnosis not present

## 2020-06-12 DIAGNOSIS — Z953 Presence of xenogenic heart valve: Secondary | ICD-10-CM | POA: Diagnosis not present

## 2020-06-12 DIAGNOSIS — I4821 Permanent atrial fibrillation: Secondary | ICD-10-CM | POA: Diagnosis present

## 2020-06-12 DIAGNOSIS — E785 Hyperlipidemia, unspecified: Secondary | ICD-10-CM | POA: Diagnosis not present

## 2020-06-12 DIAGNOSIS — Z87891 Personal history of nicotine dependence: Secondary | ICD-10-CM | POA: Diagnosis not present

## 2020-06-12 DIAGNOSIS — I2583 Coronary atherosclerosis due to lipid rich plaque: Secondary | ICD-10-CM | POA: Diagnosis not present

## 2020-06-12 LAB — BRAIN NATRIURETIC PEPTIDE: B Natriuretic Peptide: 1215.1 pg/mL — ABNORMAL HIGH (ref 0.0–100.0)

## 2020-06-12 LAB — COMPREHENSIVE METABOLIC PANEL
ALT: 25 U/L (ref 0–44)
AST: 40 U/L (ref 15–41)
Albumin: 2.6 g/dL — ABNORMAL LOW (ref 3.5–5.0)
Alkaline Phosphatase: 100 U/L (ref 38–126)
Anion gap: 7 (ref 5–15)
BUN: 21 mg/dL (ref 8–23)
CO2: 22 mmol/L (ref 22–32)
Calcium: 8 mg/dL — ABNORMAL LOW (ref 8.9–10.3)
Chloride: 107 mmol/L (ref 98–111)
Creatinine, Ser: 0.89 mg/dL (ref 0.61–1.24)
GFR, Estimated: >60 mL/min (ref >60)
Glucose, Bld: 163 mg/dL — ABNORMAL HIGH (ref 70–99)
Potassium: 3.4 mmol/L — ABNORMAL LOW (ref 3.5–5.1)
Sodium: 136 mmol/L (ref 135–145)
Total Bilirubin: 1.1 mg/dL (ref 0.3–1.2)
Total Protein: 5.8 g/dL — ABNORMAL LOW (ref 6.5–8.1)

## 2020-06-12 LAB — ECHOCARDIOGRAM COMPLETE
AR max vel: 1.38 cm2
AV Area VTI: 1.31 cm2
AV Area mean vel: 1.39 cm2
AV Mean grad: 6 mmHg
AV Peak grad: 13.2 mmHg
Ao pk vel: 1.82 m/s
Height: 68 in
P 1/2 time: 422 msec
S' Lateral: 3.8 cm
Weight: 2960 oz

## 2020-06-12 LAB — CBC
HCT: 45.4 % (ref 39.0–52.0)
Hemoglobin: 14.7 g/dL (ref 13.0–17.0)
MCH: 31.3 pg (ref 26.0–34.0)
MCHC: 32.4 g/dL (ref 30.0–36.0)
MCV: 96.6 fL (ref 80.0–100.0)
Platelets: 103 10*3/uL — ABNORMAL LOW (ref 150–400)
RBC: 4.7 MIL/uL (ref 4.22–5.81)
RDW: 15.9 % — ABNORMAL HIGH (ref 11.5–15.5)
WBC: 3.6 10*3/uL — ABNORMAL LOW (ref 4.0–10.5)
nRBC: 0 % (ref 0.0–0.2)

## 2020-06-12 LAB — BASIC METABOLIC PANEL
Anion gap: 9 (ref 5–15)
BUN: 20 mg/dL (ref 8–23)
CO2: 26 mmol/L (ref 22–32)
Calcium: 8.5 mg/dL — ABNORMAL LOW (ref 8.9–10.3)
Chloride: 107 mmol/L (ref 98–111)
Creatinine, Ser: 0.88 mg/dL (ref 0.61–1.24)
GFR, Estimated: >60 mL/min (ref >60)
Glucose, Bld: 87 mg/dL (ref 70–99)
Potassium: 3.4 mmol/L — ABNORMAL LOW (ref 3.5–5.1)
Sodium: 142 mmol/L (ref 135–145)

## 2020-06-12 LAB — CBC WITH DIFFERENTIAL/PLATELET
Abs Immature Granulocytes: 0.01 10*3/uL (ref 0.00–0.07)
Basophils Absolute: 0 10*3/uL (ref 0.0–0.1)
Basophils Relative: 1 %
Eosinophils Absolute: 0 10*3/uL (ref 0.0–0.5)
Eosinophils Relative: 0 %
HCT: 44.4 % (ref 39.0–52.0)
Hemoglobin: 14 g/dL (ref 13.0–17.0)
Immature Granulocytes: 0 %
Lymphocytes Relative: 23 %
Lymphs Abs: 0.7 10*3/uL (ref 0.7–4.0)
MCH: 31 pg (ref 26.0–34.0)
MCHC: 31.5 g/dL (ref 30.0–36.0)
MCV: 98.2 fL (ref 80.0–100.0)
Monocytes Absolute: 0.4 10*3/uL (ref 0.1–1.0)
Monocytes Relative: 13 %
Neutro Abs: 2 10*3/uL (ref 1.7–7.7)
Neutrophils Relative %: 63 %
Platelets: 100 10*3/uL — ABNORMAL LOW (ref 150–400)
RBC: 4.52 MIL/uL (ref 4.22–5.81)
RDW: 15.8 % — ABNORMAL HIGH (ref 11.5–15.5)
WBC: 3.2 10*3/uL — ABNORMAL LOW (ref 4.0–10.5)
nRBC: 0 % (ref 0.0–0.2)

## 2020-06-12 LAB — RESP PANEL BY RT-PCR (FLU A&B, COVID) ARPGX2
Influenza A by PCR: POSITIVE — AB
Influenza B by PCR: NEGATIVE
SARS Coronavirus 2 by RT PCR: NEGATIVE

## 2020-06-12 LAB — TROPONIN I (HIGH SENSITIVITY)
Troponin I (High Sensitivity): 103 ng/L (ref <18)
Troponin I (High Sensitivity): 117 ng/L (ref <18)
Troponin I (High Sensitivity): 105 ng/L (ref <18)
Troponin I (High Sensitivity): 111 ng/L (ref <18)

## 2020-06-12 LAB — PROCALCITONIN: Procalcitonin: <0.1 ng/mL

## 2020-06-12 LAB — PROTIME-INR
INR: 7.7 (ref 0.8–1.2)
Prothrombin Time: 65.2 seconds — ABNORMAL HIGH (ref 11.4–15.2)

## 2020-06-12 LAB — PHOSPHORUS: Phosphorus: 3 mg/dL (ref 2.5–4.6)

## 2020-06-12 LAB — MAGNESIUM: Magnesium: 2 mg/dL (ref 1.7–2.4)

## 2020-06-12 MED ORDER — OSELTAMIVIR PHOSPHATE 30 MG PO CAPS
30.0000 mg | ORAL_CAPSULE | Freq: Two times a day (BID) | ORAL | Status: AC
  Administered 2020-06-12 – 2020-06-16 (×10): 30 mg via ORAL
  Filled 2020-06-12 (×10): qty 1

## 2020-06-12 MED ORDER — FESOTERODINE FUMARATE ER 8 MG PO TB24
8.0000 mg | ORAL_TABLET | Freq: Every evening | ORAL | Status: DC
  Administered 2020-06-12 – 2020-06-20 (×9): 8 mg via ORAL
  Filled 2020-06-12 (×10): qty 1

## 2020-06-12 MED ORDER — LORAZEPAM 0.5 MG PO TABS: 0.5000 mg | ORAL_TABLET | Freq: Every evening | ORAL | Status: DC | PRN

## 2020-06-12 MED ORDER — SODIUM CHLORIDE 0.9 % IV SOLN
1.0000 g | INTRAVENOUS | Status: DC
  Administered 2020-06-12 – 2020-06-13 (×2): 1 g via INTRAVENOUS
  Filled 2020-06-12 (×2): qty 10

## 2020-06-12 MED ORDER — FUROSEMIDE 10 MG/ML IJ SOLN
40.0000 mg | Freq: Two times a day (BID) | INTRAMUSCULAR | Status: DC
  Administered 2020-06-12 – 2020-06-16 (×7): 40 mg via INTRAVENOUS
  Filled 2020-06-12 (×8): qty 4

## 2020-06-12 MED ORDER — WARFARIN SODIUM 5 MG PO TABS: 5.0000 mg | ORAL_TABLET | Freq: Every day | ORAL | Status: DC

## 2020-06-12 MED ORDER — SODIUM CHLORIDE 3 % IN NEBU
4.0000 mL | INHALATION_SOLUTION | Freq: Every day | RESPIRATORY_TRACT | Status: DC
  Administered 2020-06-13 – 2020-06-14 (×2): 4 mL via RESPIRATORY_TRACT
  Filled 2020-06-12 (×3): qty 4

## 2020-06-12 MED ORDER — IPRATROPIUM-ALBUTEROL 0.5-2.5 (3) MG/3ML IN SOLN
3.0000 mL | Freq: Four times a day (QID) | RESPIRATORY_TRACT | Status: DC
  Administered 2020-06-12 (×3): 3 mL via RESPIRATORY_TRACT
  Filled 2020-06-12 (×3): qty 3

## 2020-06-12 MED ORDER — ASPIRIN EC 81 MG PO TBEC
81.0000 mg | DELAYED_RELEASE_TABLET | Freq: Every day | ORAL | Status: DC
  Administered 2020-06-12 – 2020-06-21 (×10): 81 mg via ORAL
  Filled 2020-06-12 (×10): qty 1

## 2020-06-12 MED ORDER — WARFARIN - PHARMACIST DOSING INPATIENT: Freq: Every day | Status: DC

## 2020-06-12 MED ORDER — FUROSEMIDE 10 MG/ML IJ SOLN
20.0000 mg | Freq: Two times a day (BID) | INTRAMUSCULAR | Status: DC
  Administered 2020-06-12: 20 mg via INTRAVENOUS
  Filled 2020-06-12: qty 2

## 2020-06-12 MED ORDER — METOPROLOL SUCCINATE ER 25 MG PO TB24
25.0000 mg | ORAL_TABLET | Freq: Every day | ORAL | Status: DC
  Administered 2020-06-12 – 2020-06-21 (×10): 25 mg via ORAL
  Filled 2020-06-12 (×10): qty 1

## 2020-06-12 MED ORDER — POTASSIUM CHLORIDE CRYS ER 20 MEQ PO TBCR
40.0000 meq | EXTENDED_RELEASE_TABLET | Freq: Every day | ORAL | Status: DC
  Administered 2020-06-12: 40 meq via ORAL
  Filled 2020-06-12 (×2): qty 2

## 2020-06-12 MED ORDER — GUAIFENESIN ER 600 MG PO TB12
1200.0000 mg | ORAL_TABLET | Freq: Two times a day (BID) | ORAL | Status: DC
  Administered 2020-06-12 – 2020-06-21 (×19): 1200 mg via ORAL
  Filled 2020-06-12 (×20): qty 2

## 2020-06-12 MED ORDER — ISOSORBIDE MONONITRATE ER 60 MG PO TB24
120.0000 mg | ORAL_TABLET | Freq: Every day | ORAL | Status: DC
  Administered 2020-06-12 – 2020-06-21 (×10): 120 mg via ORAL
  Filled 2020-06-12 (×10): qty 2

## 2020-06-12 MED ORDER — RANIBIZUMAB 0.5 MG/0.05ML IZ SOLN: 0.5000 mg | INTRAVITREAL | Status: DC

## 2020-06-12 NOTE — ED Triage Notes (Signed)
Pt bib gems from [place] for generalized edema. Nursing staff noticed swelling in testicles when changing pt. EMS reports nonpalpable pulses and delayed cap refill. Pt also fell on the 24th and was down for 4 hours and was never evaluated after the fall. Unknown if head trauma occurred during fall. Pt stopped taking lasix after the fall because he felt weak. Hx of a-fib.   HBP;160/90  RR:30  CBG: 199

## 2020-06-12 NOTE — ED Notes (Signed)
Attempted to call report x 1  

## 2020-06-12 NOTE — Progress Notes (Addendum)
PROGRESS NOTE    Tyler Williams  ZOX:[id number] DOB: 07-Aug-1926 DOA: 06/11/2020 PCP: Burnard Bunting, MD   Brief Narrative:  Tyler Williams is a 87 y.o. male with medical history significant for permanent A. fib on Coumadin, coronary artery disease, peripheral artery disease, AAA s/p repair, aortic stenosis status post TAVR in 8119, chronic diastolic CHF, chronic venous insufficiency who presented to [facility] ED due to worsening semiproductive cough of 4 to 5 days duration.  Associated with worsening bilateral lower extremity edema and generalized weakness.  He has been noncompliant with his Lasix due to increased urination.  ED course: Afebrile.  BP 160/91, pulse 63, respiration rate 22, O2 saturation 97% on room air.  Lab studies remarkable for serum potassium 3.4, serum glucose 163, BNP 1200, troponin 103, INR 7.7, WBC 3.2, hemoglobin 14.0, platelet 100, troponin 103.  The day of, INR supratherapeutic at 7.7.  Patient started on IV diuretics due to acute on chronic diastolic CHF.  [facility] hospitalist consulted for admission for further management.  Assessment & Plan:  Acute on chronic diastolic CHF: -Secondary to noncompliance with Lasix.  Patient presented with signs of fluid overload-bilateral lower extremity edema, elevated BNP greater than 1200.  Reviewed chest x-ray. -Continue IV diuretics -Strict INO's and daily weight. -Echo is pending. -Monitor electrolytes and renal function closely  Influenza A viral pneumonia with superimposed bacterial infection: -Reviewed chest x-ray.  Continue Tamiflu 30 mg twice daily for 5 days -Continue Rocephin -DuoNebs every 6 hours as needed for wheezing and shortness of breath -Procalcitonin:<0.10 -Continue Mucinex twice daily, chest physiotherapy, incentive spirometry  Elevated troponin: -Likely demand ischemia in the setting of acute viral/bacterial illness -Reviewed EKG-no acute ischemic changes noted. -Patient denies ACS symptoms.   Echo is ordered and is pending  Supratherapeutic INR: -Patient is on Coumadin due to underlying permanent A. Fib -No signs of active bleeding. -Monitor INR closely.  Hold Coumadin  Permanent A. fib on Coumadin: -Continue Toprol -Hold Coumadin due to supratherapeutic INR  Generalized weakness: -In the setting of acute viral/bacterial infection -PT/OT -Falls precautions  Thrombocytopenia: Chronic -Platelet 103 -Continue to monitor  Hypokalemia: Replenished.  Repeat BMP tomorrow a.m.  DVT prophylaxis: SCD.  No chemical prophylaxis due to supratherapeutic INR Code Status: Full code Family Communication: Patient's son present at bedside.  Plan of care discussed with patient in length and he verbalized understanding and agreed with it. Disposition Plan: To be determined  Consultants:  None Procedures:  None Antimicrobials:   Rocephin  Status is: Inpatient   Dispo: The patient is from: ALF              Anticipated d/c is to: ALF              Patient currently is not medically stable to d/c.   Difficult to place patient No         Subjective: Patient seen and examined.  Son at the bedside.  Patient reports some improvement in breathing however he continues to have productive cough.  Reports generalized weakness, fatigue and has no energy.  Objective: Vitals:   06/12/20 0503 06/12/20 0833 06/12/20 0908 06/12/20 0956  BP: (!) 186/79 (!) 177/80 (!) 181/82   Pulse: 69 68 73   Resp: 18 16    Temp: 98.6 F (37 C) 98 F (36.7 C) 98.1 F (36.7 C)   TempSrc: Oral Oral Oral   SpO2: 96% 96% 98% 97%  Weight:      Height:  Intake/Output Summary (Last 24 hours) at 06/12/2020 1028 Last data filed at 06/12/2020 2119 Gross per 24 hour  Intake --  Output 950 ml  Net -950 ml   Filed Weights   06/12/20 0013  Weight: 83.9 kg    Examination:  General exam: Appears calm and comfortable, on room air, elderly looking, appears sick and weak Respiratory system:  Bilateral Rales noted, mild expiratory wheezing noted. cardiovascular system: S1 & S2 heard, RRR. No JVD, murmurs, rubs, gallops or clicks.  2+ pitting edema positive Gastrointestinal system: Abdomen is nondistended, soft and nontender. No organomegaly or masses felt. Normal bowel sounds heard. Central nervous system: 103 and oriented. No focal neurological deficits. Extremities: Symmetric 5 x 5 power. Skin: No rashes, lesions or ulcers Psychiatry: Judgement and insight appear normal. Mood & affect appropriate.    Data Reviewed: I have personally reviewed following labs and imaging studies  CBC: Recent Labs  Lab 06/12/20 0027 06/12/20 0354  WBC 3.2* 3.6*  NEUTROABS 2.0  --   HGB 14.0 14.7  HCT 44.4 45.4  MCV 98.2 96.6  PLT 100* 417*   Basic Metabolic Panel: Recent Labs  Lab 06/12/20 0027 06/12/20 0354  NA 136 142  K 3.4* 3.4*  CL 107 107  CO2 22 26  GLUCOSE 163* 87  BUN 21 20  CREATININE 0.89 0.88  CALCIUM 8.0* 8.5*  MG  --  2.0  PHOS  --  3.0   GFR: Estimated Creatinine Clearance: 56.5 mL/min (by C-G formula based on SCr of 0.88 mg/dL). Liver Function Tests: Recent Labs  Lab 06/12/20 0027  AST 40  ALT 25  ALKPHOS 100  BILITOT 1.1  PROT 5.8*  ALBUMIN 2.6*   No results for input(s): LIPASE, AMYLASE in the last 168 hours. No results for input(s): AMMONIA in the last 168 hours. Coagulation Profile: Recent Labs  Lab 06/12/20 0027 06/12/20 0451  INR 7.7* 7.9*   Cardiac Enzymes: No results for input(s): CKTOTAL, CKMB, CKMBINDEX, TROPONINI in the last 168 hours. BNP (last 3 results) No results for input(s): PROBNP in the last 8760 hours. HbA1C: No results for input(s): HGBA1C in the last 72 hours. CBG: No results for input(s): GLUCAP in the last 168 hours. Lipid Profile: No results for input(s): CHOL, HDL, LDLCALC, TRIG, CHOLHDL, LDLDIRECT in the last 72 hours. Thyroid Function Tests: No results for input(s): TSH, T4TOTAL, FREET4, T3FREE, THYROIDAB in  the last 72 hours. Anemia Panel: No results for input(s): VITAMINB12, FOLATE, FERRITIN, TIBC, IRON, RETICCTPCT in the last 72 hours. Sepsis Labs: Recent Labs  Lab 06/12/20 0354  PROCALCITON <0.10    Recent Results (from the past 240 hour(s))  Resp Panel by RT-PCR (Flu A&B, Covid) Nasopharyngeal Swab     Status: Abnormal   Collection Time: 06/12/20 12:27 AM   Specimen: Nasopharyngeal Swab; Nasopharyngeal(NP) swabs in vial transport medium  Result Value Ref Range Status   SARS Coronavirus 2 by RT PCR NEGATIVE NEGATIVE Final    Comment: (NOTE) SARS-CoV-2 target nucleic acids are NOT DETECTED.  The SARS-CoV-2 RNA is generally detectable in upper respiratory specimens during the acute phase of infection. The lowest concentration of SARS-CoV-2 viral copies this assay can detect is 138 copies/mL. A negative result does not preclude SARS-Cov-2 infection and should not be used as the sole basis for treatment or other patient management decisions. A negative result may occur with  improper specimen collection/handling, submission of specimen other than nasopharyngeal swab, presence of viral mutation(s) within the areas targeted by this assay, and  inadequate number of viral copies(<138 copies/mL). A negative result must be combined with clinical observations, patient history, and epidemiological information. The expected result is Negative.  Fact Sheet for Patients:  EntrepreneurPulse.com.au  Fact Sheet for Healthcare Providers:  IncredibleEmployment.be  This test is no t yet approved or cleared by the [place] FDA and  has been authorized for detection and/or diagnosis of SARS-CoV-2 by FDA under an Emergency Use Authorization (EUA). This EUA will remain  in effect (meaning this test can be used) for the duration of the COVID-19 declaration under Section 564(b)(1) of the Act, 21 U.S.C.section 360bbb-3(b)(1), unless the authorization is  terminated  or revoked sooner.       Influenza A by PCR POSITIVE (A) NEGATIVE Final   Influenza B by PCR NEGATIVE NEGATIVE Final    Comment: (NOTE) The Xpert Xpress SARS-CoV-2/FLU/RSV plus assay is intended as an aid in the diagnosis of influenza from Nasopharyngeal swab specimens and should not be used as a sole basis for treatment. Nasal washings and aspirates are unacceptable for Xpert Xpress SARS-CoV-2/FLU/RSV testing.  Fact Sheet for Patients: EntrepreneurPulse.com.au  Fact Sheet for Healthcare Providers: IncredibleEmployment.be  This test is not yet approved or cleared by the [place] FDA and has been authorized for detection and/or diagnosis of SARS-CoV-2 by FDA under an Emergency Use Authorization (EUA). This EUA will remain in effect (meaning this test can be used) for the duration of the COVID-19 declaration under Section 564(b)(1) of the Act, 21 U.S.C. section 360bbb-3(b)(1), unless the authorization is terminated or revoked.  Performed at [facility], [place] [address], [place], [place] [address]       Radiology Studies: DG Chest Port 1 View  Result Date: 06/12/2020 CLINICAL DATA:  Shortness of breath. EXAM: PORTABLE CHEST 1 VIEW COMPARISON:  None. FINDINGS: Decreased lung volumes are seen which is, in part, secondary to the degree of patient inspiration. Mild to moderate severity areas of atelectasis and/or infiltrate are seen within the bilateral lung bases, left greater than right. A thin walled parenchymal cyst is noted within the lateral aspect of the right lung base. There is no evidence of a pleural effusion or pneumothorax. There is mild to moderate severity enlargement of the cardiac silhouette. An artificial aortic valve is seen with marked severity calcification of the aortic arch. The visualized skeletal structures are unremarkable. IMPRESSION: Stable cardiomegaly with mild to moderate severity bibasilar  atelectasis and/or infiltrate. Electronically Signed   By: Virgina Norfolk M.D.   On: 06/12/2020 01:50    Scheduled Meds: . aspirin EC  81 mg Oral Daily  . fesoterodine  8 mg Oral QPM  . furosemide  20 mg Intravenous BID  . guaiFENesin  1,200 mg Oral BID  . ipratropium-albuterol  3 mL Nebulization Q6H  . isosorbide mononitrate  120 mg Oral Daily  . metoprolol succinate  25 mg Oral Daily  . oseltamivir  30 mg Oral BID  . potassium chloride  40 mEq Oral Daily  . sodium chloride HYPERTONIC  4 mL Nebulization Daily  . [START ON 06/13/2020] Warfarin - Pharmacist Dosing Inpatient   Does not apply q1600   Continuous Infusions: . cefTRIAXone (ROCEPHIN)  IV 1 g (06/12/20 0539)     LOS: 0 days   Time spent: 35 minutes   Laniqua Torrens Loann Quill, MD [facility]  If 7PM-7AM, please contact night-coverage [email or web address] 06/12/2020, 10:28 AM

## 2020-06-12 NOTE — Evaluation (Signed)
Physical Therapy Evaluation Patient Details Name: Tyler Williams MRN: [id number] DOB: Dec 16, 1926 Today's Date: 06/12/2020   History of Present Illness  The pt is a 87 yo male presenting 4/29 from [place] with BLE swelling and SOB that has been progressively worsening over last few days. Pt found to have elevated troponin, INR of 7.7, and the flu with PNA upon work up, admitted for management of troponin. PMH includes: afib, AAA s/p repair, arthritis, CAD, HTN, PVD, s/p TAVR (2018), and multiple joint replacements.    Clinical Impression  Pt in bed upon arrival of PT, agreeable to evaluation at this time. Prior to admission the pt was mobilizing with use of RW, and states he is typically safe to ambulate without supervision (despite fall on 4/24), but receives supervision for ADLs. The pt now presents with limitations in functional mobility, strength, stability, and endurance due to above dx, and will continue to benefit from skilled PT to address these deficits. The pt was able to demo good initial transfers with minG for safety and use of RW, and completed good bout of hallway ambulation without change in VS or overt LOB. Pt does rely on BUE support for gait, and is noted to have significant increase in sway with static stance without BUE support. The pt will continue to benefit from skilled PT acutely as well as resumption of therapy with return to [place].      Follow Up Recommendations Home health PT;Supervision for mobility/OOB    Equipment Recommendations   (pt has needed equipment)    Recommendations for Other Services       Precautions / Restrictions Precautions Precautions: Fall Precaution Comments: fell on 4/24 Restrictions Weight Bearing Restrictions: No      Mobility  Bed Mobility Overal bed mobility: Needs Assistance             General bed mobility comments: pt OOB in recliner upon arrival of PT    Transfers Overall transfer level: Needs  assistance Equipment used: Rolling walker (2 wheeled) Transfers: Sit to/from Stand Sit to Stand: Min guard         General transfer comment: no physical assist to power up, pt with increased use of momentum, good use of BUE without cues for safe hand placement. no instability with initial stand  Ambulation/Gait Ambulation/Gait assistance: Min guard Gait Distance (Feet): 75 Feet Assistive device: Rolling walker (2 wheeled) Gait Pattern/deviations: Step-through pattern;Decreased stride length;Trunk flexed Gait velocity: 0.4 m/s Gait velocity interpretation: <1.31 ft/sec, indicative of household ambulator General Gait Details: slow but steady with BUE support on RW. no overt LOB, trunk flexed         Balance Overall balance assessment: Needs assistance Sitting-balance support: No upper extremity supported;Feet supported Sitting balance-Leahy Scale: Good     Standing balance support: No upper extremity supported Standing balance-Leahy Scale: Poor Standing balance comment: BUE support for gait, increased sway without UE support, but pt able to don mask without UE support without assist                             Pertinent Vitals/Pain Pain Assessment: No/denies pain    Home Living Family/patient expects to be discharged to:: Assisted living ([facility])               Home Equipment: Walker - 2 wheels Additional Comments: pt from [facility] where he has lived for 2 months. uses RW and is receiving therapy there. reports he is independent  with dressing and bathing, but with further prompting states staff are there with him supervising for ADLs    Prior Function Level of Independence: Needs assistance   Gait / Transfers Assistance Needed: uses RW, reports fall when fatigued on 4/24, but has been mobilizing with RW without supervision  ADL's / Homemaking Assistance Needed: pt reports independence, states staff present to supervise, assist with some lower body  dressing        Hand Dominance        Extremity/Trunk Assessment   Upper Extremity Assessment Upper Extremity Assessment: Defer to OT evaluation    Lower Extremity Assessment Lower Extremity Assessment: Generalized weakness (increased edema in bilateral LE, pt able to complete full ROM against gravity at ankle, knee, and hip. no instances of buckling)    Cervical / Trunk Assessment Cervical / Trunk Assessment: Normal  Communication   Communication: No difficulties  Cognition Arousal/Alertness: Awake/alert Behavior During Therapy: WFL for tasks assessed/performed Overall Cognitive Status: Impaired/Different from baseline Area of Impairment: Memory;Safety/judgement                     Memory: Decreased short-term memory   Safety/Judgement: Decreased awareness of safety;Decreased awareness of deficits     General Comments: pt with slightly decreased insight to safety/need for assistance. mild inconsistencies between what pt reports and what son reports regarding assist provided by staff, generally functional, following all commands within session      General Comments General comments (skin integrity, edema, etc.): VSS on RA    Exercises     Assessment/Plan    PT Assessment Patient needs continued PT services  PT Problem List Decreased strength;Decreased range of motion;Decreased activity tolerance;Decreased balance;Decreased mobility;Cardiopulmonary status limiting activity       PT Treatment Interventions DME instruction;Gait training;Stair training;Functional mobility training;Therapeutic exercise;Therapeutic activities;Balance training;Patient/family education    PT Goals (Current goals can be found in the Care Plan section)  Acute Rehab PT Goals Patient Stated Goal: return to independence with walking PT Goal Formulation: With patient Time For Goal Achievement: 06/26/20 Potential to Achieve Goals: Good    Frequency Min 3X/week    AM-PAC PT "6  Clicks" Mobility  Outcome Measure Help needed turning from your back to your side while in a flat bed without using bedrails?: A Little Help needed moving from lying on your back to sitting on the side of a flat bed without using bedrails?: A Little Help needed moving to and from a bed to a chair (including a wheelchair)?: A Little Help needed standing up from a chair using your arms (e.g., wheelchair or bedside chair)?: A Little Help needed to walk in hospital room?: A Little Help needed climbing 3-5 steps with a railing? : A Lot 6 Click Score: 17    End of Session Equipment Utilized During Treatment: Gait belt Activity Tolerance: Patient tolerated treatment well Patient left: in chair;with call bell/phone within reach;with chair alarm set;with family/visitor present Nurse Communication: Mobility status PT Visit Diagnosis: Other abnormalities of gait and mobility (R26.89);Muscle weakness (generalized) (M62.81);History of falling (Z91.81)    Time: 2376-2831 PT Time Calculation (min) (ACUTE ONLY): 15 min   Charges:   PT Evaluation $PT Eval Low Complexity: 1 Low          Karma Ganja, PT, DPT   Acute Rehabilitation Department Pager #: [phone]  Otho Bellows 06/12/2020, 2:46 PM

## 2020-06-12 NOTE — H&P (Signed)
History and Physical  Castle Lamons WUJ:[id number] DOB: 1926-10-20 DOA: 06/11/2020  Referring physician: Dr. Stark Jock, [place]  PCP: Burnard Bunting, MD  Outpatient Specialists:  Cardiology, ophthalmology. Patient coming from:  Assisted living facility.  Chief Complaint: Worsening semiproductive cough for 4 to 5 days.  HPI: Tyler Williams is a 87 y.o. male with medical history significant for permanent A. fib on Coumadin, coronary artery disease, peripheral artery disease, AAA s/p repair, aortic stenosis status post TAVR in 9562, chronic diastolic CHF, chronic venous insufficiency who presented to [facility] ED due to worsening semiproductive cough of 4 to 5 days duration.  Associated with worsening bilateral lower extremity edema and generalized weakness.  Denies fever.  No chest pain.  He presented to the ED for further evaluation.  History is mainly obtained from EDP, his daughter at bedside and from review of medical records.  Patient has his eyes closed during interview and is minimally interactive.  Upon presentation to the ED work-up revealed acute on chronic diastolic CHF with BNP greater than 1200, chest x-ray with increase in pulmonary vascularity as well as bibasilar infiltrates.  Influenza A positive, COVID-19 negative on 06/12/2020.  INR supratherapeutic 7.7.  Started on IV diuretics by EDP for acute CHF.  TRH, hospitalist team, was asked to admit.  ED Course:  Afebrile.  BP 160/91, pulse 63, respiration rate 22, O2 saturation 97% on room air.  Lab studies remarkable for serum potassium 3.4, serum glucose 163, BNP 1200, troponin 103, INR 7.7, WBC 3.2, hemoglobin 14.0, platelet 100, troponin 103.  Review of Systems: Review of systems as noted in the HPI. All other systems reviewed and are negative.   Past Medical History:  Diagnosis Date  . A-fib ([place])   . AAA (abdominal aortic aneurysm) ([place])    s/p repair  . Aortic stenosis, mild    a. mean gradient 17 mmHg on 04/2012 TTE  .  Arthritis    knees  . CAD (coronary artery disease)    a. s/p multiple percutaneous prior coronary artery interventions b. 05/2012: unsuccessful PCI to tight RCA->medically managed  . Cancer [facility])    skin cancer removed, right hand, left leg, chest  . Carotid artery disease ([place])    0-39% bilateral ICA stenoses 11/2011  . Chronic anticoagulation    Coumadin  . Coronary artery disease   . GERD (gastroesophageal reflux disease)   . GERD (gastroesophageal reflux disease)   . History of pulmonary embolism    1964 related to dislocated hip on right   . History of skin cancer   . Hypercholesteremia   . Hypertension   . Incidental cecal mass noted on CT imaging 05/24/2016   **An incidental finding of potential clinical significance has been found. 4.6 x 5.6 x 3.7 cm masslike lesion in the cecum adjacent to the ileocecal valve may represent a large polyp or colonic neoplasm. Correlation with nonemergent colonoscopy is strongly recommended in the near future to better evaluate this finding.**  . Macular degeneration    eye injections  . Microscopic hematuria    Followed by urology  . Permanent atrial fibrillation ([place])   . PVD (peripheral vascular disease) ([place])    Bilateral renal artery atherosclerosis, SMA stenosis with collateralization  . RBBB    Chronic  . 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 SSS (sick sinus syndrome) [facility])    Past Surgical History:  Procedure  Laterality Date  . ABDOMINAL AORTIC ANEURYSM REPAIR     1993   . CARDIAC CATHETERIZATION  05/24/2012   pD1 20%, oCFX 20%, mCFX 30%, ostial Int Br 30%, distal AV groove CFX 30%, pRCA 30-40%, mRCA 99%, dRCA 30%, PL branch small with diffuse 40%. Unable to pass wire past RCA lesion->medically managed  . CHOLECYSTECTOMY N/A 12/05/2012   Procedure: LAPAROSCOPIC CHOLECYSTECTOMY ;  Surgeon: Edward Jolly, MD;  Location: [place];  Service: General;  Laterality: N/A;  . CHOLECYSTECTOMY    . CORONARY ANGIOPLASTY  1980, 1990  . CORONARY BALLOON ANGIOPLASTY N/A 08/31/2017   Procedure: CORONARY BALLOON ANGIOPLASTY;  Surgeon: Martinique, Peter M, MD;  Location: [place] CV LAB;  Service: Cardiovascular;  Laterality: N/A;  . EYE SURGERY     hx of cataract surgery   . [place]  . HERNIA REPAIR     right inguinal hernia repair   . JOINT REPLACEMENT     left knee replacement, partial right  . LEFT HEART CATH AND CORONARY ANGIOGRAPHY N/A 08/31/2017   Procedure: LEFT HEART CATH AND CORONARY ANGIOGRAPHY;  Surgeon: Martinique, Peter M, MD;  Location: [place] CV LAB;  Service: Cardiovascular;  Laterality: N/A;  . LEFT HEART CATHETERIZATION WITH CORONARY ANGIOGRAM N/A 05/24/2012   Procedure: LEFT HEART CATHETERIZATION WITH CORONARY ANGIOGRAM;  Surgeon: Burnell Blanks, MD;  Location: [facility] CATH LAB;  Service: Cardiovascular;  Laterality: N/A;  . ORTHOPEDIC SURGERY    . OTHER SURGICAL HISTORY     right knee popliteal aneurysm surgery stent placed   . OTHER SURGICAL HISTORY    . PARTIAL KNEE ARTHROPLASTY  01/22/2012   Procedure: UNICOMPARTMENTAL KNEE;  Surgeon: Mauri Pole, MD;  Location: [facility] ORS;  Service: Orthopedics;  Laterality: Right;  . PERCUTANEOUS CORONARY INTERVENTION-BALLOON ONLY  05/24/2012   Procedure: PERCUTANEOUS CORONARY INTERVENTION-BALLOON ONLY;  Surgeon: Burnell Blanks, MD;  Location: [facility] CATH LAB;  Service: Cardiovascular;;  . RIGHT/LEFT HEART CATH AND CORONARY ANGIOGRAPHY N/A 05/12/2016   Procedure: Right/Left Heart Cath and Coronary Angiography;  Surgeon: Sherren Mocha, MD;  Location: [place] CV LAB;  Service: Cardiovascular;  Laterality: N/A;  . TEE WITHOUT CARDIOVERSION N/A 06/13/2016   Procedure: TRANSESOPHAGEAL ECHOCARDIOGRAM (TEE);  Surgeon: Sherren Mocha, MD;  Location: [place];  Service: Open Heart Surgery;  Laterality: N/A;  . TONSILLECTOMY     . TOTAL KNEE ARTHROPLASTY Left   . TRANSCATHETER AORTIC VALVE REPLACEMENT, TRANSFEMORAL N/A 06/13/2016   Procedure: TRANSCATHETER AORTIC VALVE REPLACEMENT, TRANSFEMORAL;  Surgeon: Sherren Mocha, MD;  Location: [place];  Service: Open Heart Surgery;  Laterality: N/A;    Social History:  reports that he has never smoked. He has never used smokeless tobacco. He reports previous alcohol use. He reports that he does not use drugs.   Allergies  Allergen Reactions  . No Known Allergies Other (See Comments)    NKA    Family History  Problem Relation Age of Onset  . Heart attack Mother 5      Prior to Admission medications   Medication Sig Start Date End Date Taking? Authorizing Provider  aspirin EC 81 MG tablet Take 81 mg by mouth daily.   Yes [provider]  atorvastatin (LIPITOR) 20 MG tablet TAKE 1 TABLET ONCE DAILY. Patient taking differently: Take 20 mg by mouth at bedtime. 04/09/20  Yes Sherren Mocha, MD  Calcium-Magnesium-Zinc (CAL-MAG-ZINC PO) Take 1 tablet by mouth daily.   Yes [provider]  Cholecalciferol (VITAMIN D3)  5000 units TABS Take 5,000 Units by mouth daily.   Yes [provider]  Coenzyme Q10 (COQ10) 100 MG CAPS Take 100 mg by mouth daily.   Yes [provider]  fesoterodine (TOVIAZ) 8 MG TB24 tablet Take 8 mg by mouth every evening.   Yes [provider]  furosemide (LASIX) 80 MG tablet TAKE 1 TABLET ONCE DAILY. Patient taking differently: Take 80 mg by mouth daily. 01/12/20  Yes Sherren Mocha, MD  isosorbide mononitrate (IMDUR) 60 MG 24 hr tablet TAKE (2) TABLETS BY MOUTH DAILY. Patient taking differently: Take 120 mg by mouth daily. 04/06/20  Yes Sherren Mocha, MD  LORazepam (ATIVAN) 1 MG tablet Take 1 mg by mouth at bedtime.   Yes [provider]  metoprolol succinate (TOPROL-XL) 25 MG 24 hr tablet Take 1 tablet (25 mg total) by mouth daily. 10/10/19  Yes Weaver, Scott T, PA-C  Multiple Vitamins-Minerals  (PRESERVISION AREDS) CAPS Take 1 tablet by mouth daily. 03/04/09  Yes [provider]  nitroGLYCERIN (NITROSTAT) 0.4 MG SL tablet Place 0.4 mg under the tongue every 5 (five) minutes as needed for chest pain.   Yes [provider]  Polyethyl Glycol-Propyl Glycol (SYSTANE OP) Place 2 drops into both eyes 2 (two) times daily as needed.   Yes [provider]  polyethylene glycol powder (GLYCOLAX/MIRALAX) 17 GM/SCOOP powder Take 17 g by mouth daily as needed for mild constipation.   Yes [provider]  ramipril (ALTACE) 10 MG capsule Take 10 mg by mouth 2 (two) times daily.   Yes [provider]  ranibizumab (LUCENTIS) 0.5 MG/0.05ML SOLN 0.5 mg by Intravitreal route See admin instructions. Every 9 weeks   Yes [provider]  warfarin (COUMADIN) 5 MG tablet Take 5 mg by mouth daily.   Yes [provider]    Physical Exam: BP (!) 168/91   Pulse 63   Temp 98.8 F (37.1 C) (Oral)   Resp (!) 22   Ht 5\' 8"  (1.727 m)   Wt 83.9 kg   SpO2 97%   BMI 28.13 kg/m   . General: 87 y.o. year-old male well developed well nourished in no acute distress.  Alert and minimally interactive. . Cardiovascular: Irregular rate and rhythm with no rubs or gallops.  No thyromegaly or JVD noted.  2+ pitting edema in lower extremities bilaterally.   Marland Kitchen Respiratory: Diffuse rales bilaterally.  Mild wheezing noted.  Poor inspiratory effort. . Abdomen: Soft nontender nondistended with normal bowel sounds x4 quadrants. . Muskuloskeletal: No cyanosis or clubbing.  2+ pitting edema in lower extremities bilaterally.   . Neuro: CN II-XII intact, strength, sensation, reflexes . Skin: No ulcerative lesions noted or rashes . Psychiatry: Judgement and insight appear normal. Mood is appropriate for condition and setting          Labs on Admission:  Basic Metabolic Panel: Recent Labs  Lab 06/12/20 0027  NA 136  K 3.4*  CL 107  CO2 22  GLUCOSE 163*  BUN 21   CREATININE 0.89  CALCIUM 8.0*   Liver Function Tests: Recent Labs  Lab 06/12/20 0027  AST 40  ALT 25  ALKPHOS 100  BILITOT 1.1  PROT 5.8*  ALBUMIN 2.6*   No results for input(s): LIPASE, AMYLASE in the last 168 hours. No results for input(s): AMMONIA in the last 168 hours. CBC: Recent Labs  Lab 06/12/20 0027  WBC 3.2*  NEUTROABS 2.0  HGB 14.0  HCT 44.4  MCV 98.2  PLT 100*  Cardiac Enzymes: No results for input(s): CKTOTAL, CKMB, CKMBINDEX, TROPONINI in the last 168 hours.  BNP (last 3 results) Recent Labs    06/12/20 0027  BNP 1,215.1*    ProBNP (last 3 results) No results for input(s): PROBNP in the last 8760 hours.  CBG: No results for input(s): GLUCAP in the last 168 hours.  Radiological Exams on Admission: DG Chest Port 1 View  Result Date: 06/12/2020 CLINICAL DATA:  Shortness of breath. EXAM: PORTABLE CHEST 1 VIEW COMPARISON:  None. FINDINGS: Decreased lung volumes are seen which is, in part, secondary to the degree of patient inspiration. Mild to moderate severity areas of atelectasis and/or infiltrate are seen within the bilateral lung bases, left greater than right. A thin walled parenchymal cyst is noted within the lateral aspect of the right lung base. There is no evidence of a pleural effusion or pneumothorax. There is mild to moderate severity enlargement of the cardiac silhouette. An artificial aortic valve is seen with marked severity calcification of the aortic arch. The visualized skeletal structures are unremarkable. IMPRESSION: Stable cardiomegaly with mild to moderate severity bibasilar atelectasis and/or infiltrate. Electronically Signed   By: Virgina Norfolk M.D.   On: 06/12/2020 01:50    EKG: I independently viewed the EKG done and my findings are as followed: Atrial fibrillation, rate of 59, nonspecific ST-T changes.  QTc 492.  Assessment/Plan Present on Admission: **None**  Active Problems:   Acute CHF (congestive heart failure)  (HCC)  Acute on chronic diastolic CHF Presented with semiproductive cough, bilateral lower extremity 2+ pitting edema, elevated BNP greater than 1200. Personally reviewed chest x-ray showing increase in pulmonary vascularity suggestive of pulmonary edema, also bilateral pulmonary infiltrates with concern for superimposed pulmonary bacterial infection. Continue diuretics, IV Lasix 20 mg twice daily. Resume cardiac medications Obtain 2D echo Please consult cardiology in the morning. Start strict I's and O's and daily weight  Influenza A viral pneumonia with concern for superimposed bacterial pulmonary infection Started Tamiflu 30 mg twice daily x5 days. Started Rocephin empirically due to concern for superimposed bacterial pulmonary infection. Follow sputum culture and procalcitonin level DuoNebs every 6 hours for wheezing. Pulmonary toilette Mucinex 1200 mg twice daily, saline nebs daily, chest physiotherapy daily, incentive spirometer.  Elevated troponin, suspect demand ischemia in the setting of acute illness Troponin 103, cycle x2. No evidence of acute ischemia on twelve-lead EKG Repeat twelve-lead EKG Monitor on telemetry Follow 2D echo  Supratherapeutic INR On Coumadin for permanent A. Fib Presented with INR of 7.7, hold off Coumadin. Repeat INR level and treat with low-dose vitamin K 2.5 mg x 1, if indicated or if bleeding. Coumadin will be restarted by pharmacy when appropriate.  Permanent A. fib on Coumadin Rate controlled on Toprol-XL Supratherapeutic on Coumadin, hold off Coumadin. Monitor on telemetry  Generalized weakness Likely from acute illness PT OT to assess Fall precautions    DVT prophylaxis: SCDs.  Code Status: Full code as stated by his daughter at bedside who is also his medical POA.  Family Communication: Plan discussed with his daughter who is also medical POA at bedside.  Disposition Plan: Admit to telemetry medical.  Consults called: Please  consult cardiology in the morning.  Admission status: Inpatient status.  Patient will require least 2 midnights for further evaluation and treatment of present condition.   Status is: Inpatient    Dispo: The patient is from: ALF Home.               Anticipated d/c is to:  ALF               Patient currently not stable for discharge due to ongoing management of acute on chronic diastolic CHF, supratherapeutic INR.    Difficult to place patient, not applicable.        Kayleen Memos MD [facility] Pager [phone]  If 7PM-7AM, please contact night-coverage [email or web address] Password [facility]  06/12/2020, 3:36 AM

## 2020-06-12 NOTE — Progress Notes (Signed)
  Echocardiogram 2D Echocardiogram has been performed.  Merrie Roof F 06/12/2020, 5:18 PM

## 2020-06-12 NOTE — Evaluation (Signed)
Occupational Therapy Evaluation and Discharge Patient Details Name: Tyler Williams MRN: [id number] DOB: Nov 23, 1926 Today's Date: 06/12/2020    History of Present Illness The pt is a 87 yo male presenting 4/29 from [place] with BLE swelling and SOB that has been progressively worsening over last few days. Pt found to have elevated troponin, INR of 7.7, and the flu with PNA upon work up, admitted for management of troponin. PMH includes: afib, AAA s/p repair, arthritis, CAD, HTN, PVD, s/p TAVR (2018), and multiple joint replacements.   Clinical Impression   This 87 yo male admitted with above presents to acute OT at an overall intermittent S level for  basic ADLs at [facility] level.     Follow Up Recommendations  No OT follow up;Supervision - Intermittent    Equipment Recommendations  None recommended by OT       Precautions / Restrictions Precautions Precautions: Fall Precaution Comments: fell on 4/24 Restrictions Weight Bearing Restrictions: No      Mobility OOB in recliner upon arrival   Transfers Overall transfer level: Needs assistance Equipment used: Rolling walker (2 wheeled) Transfers: Sit to/from Stand Sit to Stand: Min guard         General transfer comment: no physical assist to power up, pt with increased use of momentum, good use of BUE without cues for safe hand placement. no instability with initial stand    Balance Overall balance assessment: Needs assistance Sitting-balance support: No upper extremity supported;Feet supported Sitting balance-Leahy Scale: Good     Standing balance support: No upper extremity supported Standing balance-Leahy Scale: Poor Standing balance comment: BUE support for gait, increased sway without UE support, but pt able to don mask without UE support without assist; able to stand at sink to brush teeth and wash face without UE support                           ADL either performed or assessed with clinical  judgement   ADL Overall ADL's : intermittent S                                       General ADL Comments: at RW level     Vision Patient Visual Report: No change from baseline              Pertinent Vitals/Pain Pain Assessment: No/denies pain     Hand Dominance  right   Extremity/Trunk Assessment Upper Extremity Assessment Upper Extremity Assessment: Defer to OT evaluation     Communication Communication Communication: No difficulties   Cognition Arousal/Alertness: Awake/alert Behavior During Therapy: WFL for tasks assessed/performed Overall Cognitive Status: Impaired/Different from baseline Area of Impairment: Memory;Safety/judgement                     Memory: Decreased short-term memory   Safety/Judgement: Decreased awareness of safety;Decreased awareness of deficits     General Comments: pt with slightly decreased insight to safety/need for assistance. mild inconsistencies between what pt reports and what son reports regarding assist provided by staff, generally functional, following all commands within session   General Comments  VSS on RA            Home Living Family/patient expects to be discharged to:: Assisted living ([facility])  Home Equipment: Walker - 2 wheels   Additional Comments: pt from [place] where he has lived for 2 months. uses [facility] and is receiving therapy there. reports he is independent with dressing and bathing, but with further prompting states staff are there with him supervising for showering and A with donning socks.      Prior Functioning/Environment Level of Independence: Needs assistance  Gait / Transfers Assistance Needed: uses [facility], reports fall when fatigued on 4/24, but has been mobilizing with [facility] without supervision ADL's / Homemaking Assistance Needed: pt reports independence, states staff present to supervise, assist with some lower body dressing             OT Problem List: Impaired balance (sitting and/or standing)         OT Goals(Current goals can be found in the care plan section) Acute Rehab OT Goals Patient Stated Goal: return to independence with walking                AM-PAC OT "6 Clicks" Daily Activity     Outcome Measure Help from another person eating meals?: None Help from another person taking care of personal grooming?: None Help from another person toileting, which includes using toliet, bedpan, or urinal?: None Help from another person bathing (including washing, rinsing, drying)?: None Help from another person to put on and taking off regular upper body clothing?: None Help from another person to put on and taking off regular lower body clothing?: None 6 Click Score: 24   End of Session Equipment Utilized During Treatment: Gait belt;Rolling walker  Activity Tolerance: Patient tolerated treatment well Patient left: in chair;with call bell/phone within reach;with chair alarm set;with family/visitor present  OT Visit Diagnosis: Muscle weakness (generalized) (M62.81)                Time: 1694-5038 OT Time Calculation (min): 15 min Charges:  OT General Charges $OT Visit: 1 Visit OT Evaluation $OT Eval Moderate Complexity: 1 Mod  [place] Circle, OTR/L Acute [employer] Pager [phone] Office [phone]     Almon Register 06/12/2020, 3:05 PM

## 2020-06-12 NOTE — Progress Notes (Signed)
ANTICOAGULATION CONSULT NOTE - Initial Consult  Pharmacy Consult for Warfarin Indication: atrial fibrillation  Allergies  Allergen Reactions  . No Known Allergies Other (See Comments)    NKA    Patient Measurements: Height: 5\' 8"  (172.7 cm) Weight: 83.9 kg (185 lb) IBW/kg (Calculated) : 68.4  Vital Signs: Temp: 98.8 F (37.1 C) (04/29 2341) Temp Source: Oral (04/29 2341) BP: 168/91 (04/30 0330) Pulse Rate: 63 (04/30 0330)  Labs: Recent Labs    06/12/20 0027 06/12/20 0239  HGB 14.0  --   HCT 44.4  --   PLT 100*  --   LABPROT 65.2*  --   INR 7.7*  --   CREATININE 0.89  --   TROPONINIHS 103* 117*    Estimated Creatinine Clearance: 55.9 mL/min (by C-G formula based on SCr of 0.89 mg/dL).   Medical History: Past Medical History:  Diagnosis Date  . A-fib ([place])   . AAA (abdominal aortic aneurysm) ([place])    s/p repair  . Aortic stenosis, mild    a. mean gradient 17 mmHg on 04/2012 TTE  . Arthritis    knees  . CAD (coronary artery disease)    a. s/p multiple percutaneous prior coronary artery interventions b. 05/2012: unsuccessful PCI to tight RCA->medically managed  . Cancer [facility])    skin cancer removed, right hand, left leg, chest  . Carotid artery disease ([place])    0-39% bilateral ICA stenoses 11/2011  . Chronic anticoagulation    Coumadin  . Coronary artery disease   . GERD (gastroesophageal reflux disease)   . GERD (gastroesophageal reflux disease)   . History of pulmonary embolism    1964 related to dislocated hip on right   . History of skin cancer   . Hypercholesteremia   . Hypertension   . Incidental cecal mass noted on CT imaging 05/24/2016   **An incidental finding of potential clinical significance has been found. 4.6 x 5.6 x 3.7 cm masslike lesion in the cecum adjacent to the ileocecal valve may represent a large polyp or colonic neoplasm. Correlation with nonemergent colonoscopy is strongly recommended in the near future to better evaluate this  finding.**  . Macular degeneration    eye injections  . Microscopic hematuria    Followed by urology  . Permanent atrial fibrillation ([place])   . PVD (peripheral vascular disease) ([place])    Bilateral renal artery atherosclerosis, SMA stenosis with collateralization  . RBBB    Chronic  . 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 SSS (sick sinus syndrome) ([facility])     Medications:  Await electronic med rec  Assessment: 87 y.o. F presents from [place] for edema. Pt on warfarin PTA for afib. Admission INR 7.7 (supratherapeutic). H/H wnl, Plt 100 (usually ~120). Home dose: 5mg  daily - last dose 4/29  Goal of Therapy:  INR 2-3 Monitor platelets by anticoagulation protocol: Yes   Plan:  Daily INR No coumadin today  Sherlon Handing, PharmD, BCPS Please see amion for complete clinical pharmacist phone list 06/12/2020,3:52 AM

## 2020-06-13 DIAGNOSIS — I10 Essential (primary) hypertension: Secondary | ICD-10-CM

## 2020-06-13 DIAGNOSIS — I2583 Coronary atherosclerosis due to lipid rich plaque: Secondary | ICD-10-CM

## 2020-06-13 DIAGNOSIS — I2510 Atherosclerotic heart disease of native coronary artery without angina pectoris: Secondary | ICD-10-CM

## 2020-06-13 DIAGNOSIS — I5031 Acute diastolic (congestive) heart failure: Secondary | ICD-10-CM | POA: Diagnosis not present

## 2020-06-13 DIAGNOSIS — I482 Chronic atrial fibrillation, unspecified: Secondary | ICD-10-CM

## 2020-06-13 LAB — CBC
HCT: 46.4 % (ref 39.0–52.0)
Hemoglobin: 15.2 g/dL (ref 13.0–17.0)
MCH: 31.1 pg (ref 26.0–34.0)
MCHC: 32.8 g/dL (ref 30.0–36.0)
MCV: 94.9 fL (ref 80.0–100.0)
Platelets: 103 10*3/uL — ABNORMAL LOW (ref 150–400)
RBC: 4.89 MIL/uL (ref 4.22–5.81)
RDW: 15.8 % — ABNORMAL HIGH (ref 11.5–15.5)
WBC: 4.3 10*3/uL (ref 4.0–10.5)
nRBC: 0 % (ref 0.0–0.2)

## 2020-06-13 LAB — URINALYSIS, ROUTINE W REFLEX MICROSCOPIC
Bilirubin Urine: NEGATIVE
Glucose, UA: NEGATIVE mg/dL
Ketones, ur: 15 mg/dL — AB
Nitrite: POSITIVE — AB
Protein, ur: >300 mg/dL — AB
Specific Gravity, Urine: 1.01 (ref 1.005–1.030)
pH: 6.5 (ref 5.0–8.0)

## 2020-06-13 LAB — PROTIME-INR
INR: 7.9 (ref 0.8–1.2)
INR: 6.3 (ref 0.8–1.2)
Prothrombin Time: 66.5 seconds — ABNORMAL HIGH (ref 11.4–15.2)
Prothrombin Time: 55.2 seconds — ABNORMAL HIGH (ref 11.4–15.2)

## 2020-06-13 LAB — BASIC METABOLIC PANEL WITH GFR
Anion gap: 9 (ref 5–15)
BUN: 16 mg/dL (ref 8–23)
CO2: 27 mmol/L (ref 22–32)
Calcium: 8.3 mg/dL — ABNORMAL LOW (ref 8.9–10.3)
Chloride: 102 mmol/L (ref 98–111)
Creatinine, Ser: 0.83 mg/dL (ref 0.61–1.24)
GFR, Estimated: >60 mL/min (ref >60)
Glucose, Bld: 98 mg/dL (ref 70–99)
Potassium: 3.6 mmol/L (ref 3.5–5.1)
Sodium: 138 mmol/L (ref 135–145)

## 2020-06-13 LAB — URINALYSIS, MICROSCOPIC (REFLEX)
RBC / HPF: >50 RBC/hpf (ref 0–5)
Squamous Epithelial / HPF: NONE SEEN (ref 0–5)
WBC, UA: >50 WBC/hpf (ref 0–5)

## 2020-06-13 LAB — PHOSPHORUS: Phosphorus: 2.6 mg/dL (ref 2.5–4.6)

## 2020-06-13 LAB — MAGNESIUM: Magnesium: 1.8 mg/dL (ref 1.7–2.4)

## 2020-06-13 MED ORDER — HYDROMORPHONE HCL 1 MG/ML IJ SOLN
0.5000 mg | INTRAMUSCULAR | Status: DC | PRN
  Administered 2020-06-13: 0.5 mg via INTRAVENOUS
  Filled 2020-06-13: qty 0.5

## 2020-06-13 MED ORDER — CHLORHEXIDINE GLUCONATE CLOTH 2 % EX PADS
6.0000 | MEDICATED_PAD | Freq: Every day | CUTANEOUS | Status: DC
  Administered 2020-06-14 – 2020-06-21 (×7): 6 via TOPICAL

## 2020-06-13 MED ORDER — POLYETHYLENE GLYCOL 3350 17 GM/SCOOP PO POWD
17.0000 g | Freq: Every day | ORAL | Status: DC | PRN
  Filled 2020-06-13: qty 255

## 2020-06-13 MED ORDER — POLYETHYLENE GLYCOL 3350 17 G PO PACK
17.0000 g | PACK | Freq: Every day | ORAL | Status: DC | PRN
  Administered 2020-06-17: 17 g via ORAL
  Filled 2020-06-13: qty 1

## 2020-06-13 MED ORDER — FINASTERIDE 5 MG PO TABS
5.0000 mg | ORAL_TABLET | Freq: Every day | ORAL | Status: DC
  Administered 2020-06-13 – 2020-06-21 (×9): 5 mg via ORAL
  Filled 2020-06-13 (×9): qty 1

## 2020-06-13 MED ORDER — TAMSULOSIN HCL 0.4 MG PO CAPS
0.4000 mg | ORAL_CAPSULE | Freq: Every day | ORAL | Status: DC
  Administered 2020-06-13 – 2020-06-21 (×9): 0.4 mg via ORAL
  Filled 2020-06-13 (×9): qty 1

## 2020-06-13 MED ORDER — NITROGLYCERIN 0.4 MG SL SUBL: 0.4000 mg | SUBLINGUAL_TABLET | SUBLINGUAL | Status: DC | PRN

## 2020-06-13 MED ORDER — LIDOCAINE HCL URETHRAL/MUCOSAL 2 % EX GEL
1.0000 "application " | Freq: Once | CUTANEOUS | Status: AC
  Administered 2020-06-13: 1 via URETHRAL
  Filled 2020-06-13: qty 11

## 2020-06-13 MED ORDER — POTASSIUM CHLORIDE 20 MEQ PO PACK
40.0000 meq | PACK | Freq: Two times a day (BID) | ORAL | Status: AC
  Administered 2020-06-13 (×2): 40 meq via ORAL
  Filled 2020-06-13 (×2): qty 2

## 2020-06-13 MED ORDER — ATORVASTATIN CALCIUM 10 MG PO TABS
20.0000 mg | ORAL_TABLET | Freq: Every day | ORAL | Status: DC
  Administered 2020-06-13 – 2020-06-21 (×9): 20 mg via ORAL
  Filled 2020-06-13 (×9): qty 2

## 2020-06-13 MED ORDER — IPRATROPIUM-ALBUTEROL 0.5-2.5 (3) MG/3ML IN SOLN
3.0000 mL | Freq: Three times a day (TID) | RESPIRATORY_TRACT | Status: DC
  Administered 2020-06-13 – 2020-06-14 (×4): 3 mL via RESPIRATORY_TRACT
  Filled 2020-06-13 (×3): qty 3

## 2020-06-13 MED ORDER — MAGNESIUM SULFATE 2 GM/50ML IV SOLN
2.0000 g | Freq: Once | INTRAVENOUS | Status: AC
  Administered 2020-06-13: 2 g via INTRAVENOUS
  Filled 2020-06-13: qty 50

## 2020-06-13 MED ORDER — AMLODIPINE BESYLATE 5 MG PO TABS
5.0000 mg | ORAL_TABLET | Freq: Every day | ORAL | Status: DC
  Administered 2020-06-14: 5 mg via ORAL
  Filled 2020-06-13: qty 1

## 2020-06-13 MED ORDER — SODIUM CHLORIDE 0.9 % IV SOLN
2.0000 g | INTRAVENOUS | Status: AC
  Administered 2020-06-14 – 2020-06-16 (×3): 2 g via INTRAVENOUS
  Filled 2020-06-13 (×3): qty 20

## 2020-06-13 MED ORDER — RAMIPRIL 2.5 MG PO CAPS
10.0000 mg | ORAL_CAPSULE | Freq: Two times a day (BID) | ORAL | Status: DC
  Administered 2020-06-13 – 2020-06-15 (×6): 10 mg via ORAL
  Filled 2020-06-13 (×6): qty 4

## 2020-06-13 NOTE — Procedures (Signed)
Foley Catheter Placement Note  Indications: 87 y.o. male with afib on coumadin, CAD, PAD, CHF who is hospitalized for CHF exacerbation, PNA found to be in urinary retention. Urology was consulted following several attempts at catheter placement by nursing staff with blood at meatus and 500cc on bladder scan.   Patient reports he has had frequency, urgency and intermittent incontinent episodes for some time as well as feelings of weak stream and incomplete emptying which are likely secondary to BPH, BOO and overflow incontinence.  Pre-operative Diagnosis: Urinary retention  Post-operative Diagnosis: Same  Surgeon: Ermelinda Das, MD  Assistants: None  Procedure Details  Patient was placed in the supine position, prepped with Betadine and draped in the usual sterile fashion. There was existing large blood at meatus.  We injected lidocaine jelly per urethra prior to the procedure. A 20Fr coude was attempted, however it was clear there was a false passage. Placement of a sensor wire was then attempted however was unable to pass into bladder. At this point we elected to perform flexible cystoscopy. A flexible cystoscope was passed per urethra with copious lubrication and irrigation. There was blood throughout urethra and large false passage at the prostate. After carefully bypassing the false passage with a wire which was inserted into the bladder, we then inserted a 20 [place] council tip catheter per urethra which easily passed into the bladder over the wire without any resistance.  We achieved return of clear yellow urine then proceeded to insert 10 mL of sterile water into the Foley balloon.  The catheter was attached to a drainage bag. Placement of the catheter had return of greater than 1000 mL of blood-tinged urine.               Complications: None; patient tolerated the procedure well.  Plan:   1.  Continue Foley catheter to drainage for 7-10days due to greater than 1000 mL of urine returned  with catheter placement and mild bladder stretch injury along with urethral trauma from attempted catheter placements 2.  Remove Foley catheter with with [facility] in follow up in 1-2 weeks with TOV, patient will be contacted to schedule however is also encouraged to call and confirm follow up. 3. Please continue catheter care while foley is in place 4. Catheter can be hand irrigated as needed by nursing staff. Hematuria is expected the setting of foley trauma and ongoing blood thinning medications. Please call if catheter does not start draining despite flushing 5. Please discharge patient on flomax and finasteride       Attending Attestation: Dr. Tresa Moore was available.

## 2020-06-13 NOTE — Progress Notes (Signed)
Pt complaining of urgency. Md made aware bladder scan completed 500-600 ml md made aware. Attempted to in&out met resistance and called 2nd RN unsuccessfully. MD made aware with orders to attempt caudae cath and obtain UA.

## 2020-06-13 NOTE — Progress Notes (Signed)
ANTICOAGULATION CONSULT NOTE - Initial Consult  Pharmacy Consult for Warfarin Indication: atrial fibrillation  Allergies  Allergen Reactions  . No Known Allergies Other (See Comments)    NKA    Patient Measurements: Height: 5\' 8"  (172.7 cm) Weight: 78 kg (172 lb) (scale b) IBW/kg (Calculated) : 68.4  Vital Signs: Temp: 97.4 F (36.3 C) (05/01 0425) Temp Source: Oral (05/01 0425) BP: 168/69 (05/01 0425) Pulse Rate: 64 (05/01 0425)  Labs: Recent Labs    06/12/20 0027 06/12/20 0239 06/12/20 0354 06/12/20 0451 06/12/20 0532 06/13/20 0231  HGB 14.0  --  14.7  --   --  15.2  HCT 44.4  --  45.4  --   --  46.4  PLT 100*  --  103*  --   --  103*  LABPROT 65.2*  --   --  66.5*  --  55.2*  INR 7.7*  --   --  7.9*  --  6.3*  CREATININE 0.89  --  0.88  --   --  0.83  TROPONINIHS 103* 117* 105*  --  111*  --     Estimated Creatinine Clearance: 54.9 mL/min (by C-G formula based on SCr of 0.83 mg/dL).   Medical History: Past Medical History:  Diagnosis Date  . A-fib ([place])   . AAA (abdominal aortic aneurysm) ([place] [place])    s/p repair  . Aortic stenosis, mild    a. mean gradient 17 mmHg on 04/2012 TTE  . Arthritis    knees  . CAD (coronary artery disease)    a. s/p multiple percutaneous prior coronary artery interventions b. 05/2012: unsuccessful PCI to tight RCA->medically managed  . Cancer [facility])    skin cancer removed, right hand, left leg, chest  . Carotid artery disease ([place])    0-39% bilateral ICA stenoses 11/2011  . Chronic anticoagulation    Coumadin  . Coronary artery disease   . GERD (gastroesophageal reflux disease)   . GERD (gastroesophageal reflux disease)   . History of pulmonary embolism    1964 related to dislocated hip on right   . History of skin cancer   . Hypercholesteremia   . Hypertension   . Incidental cecal mass noted on CT imaging 05/24/2016   **An incidental finding of potential clinical significance has been found. 4.6 x 5.6 x 3.7 cm masslike  lesion in the cecum adjacent to the ileocecal valve may represent a large polyp or colonic neoplasm. Correlation with nonemergent colonoscopy is strongly recommended in the near future to better evaluate this finding.**  . Macular degeneration    eye injections  . Microscopic hematuria    Followed by urology  . Permanent atrial fibrillation ([place])   . PVD (peripheral vascular disease) ([place])    Bilateral renal artery atherosclerosis, SMA stenosis with collateralization  . RBBB    Chronic  . 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 SSS (sick sinus syndrome) ([facility])     Assessment: 87 YO male presents from [facility] for edema. Pt on warfarin PTA for afib. Admission INR 7.7 (supratherapeutic). H/H wnl, Plt 100 (usually ~120). Home dose: 5mg  daily - last dose 4/29  INR this AM trending down from 7.9 to 6.3. No s/sx bleeding noted and CBC is stable. 50% of meal charted yesterday.   Goal of Therapy:  INR 2-3 Monitor platelets by anticoagulation protocol: Yes   Plan:  Monitor daily INR, s/sx  bleeding Hold warfarin again today Follow cardiology plans   Mercy Riding, PharmD PGY1 Acute Care Pharmacy Resident Please refer to [facility] for unit-specific pharmacist

## 2020-06-13 NOTE — Consult Note (Incomplete)
Reason for Consult:***  Referring Physician: ***  Naftuli Dhondt is an 87 y.o. male.   HPI:   1 - Urinary Retention - new urinary retention during admission for CHF and pneumonia. Prostate 15mL by CT 2019 (moderate enlargement). I+O cath x several but then unable to place by nurisng with blood c/w foley trauma. No GU meds at baseline.   2 - Foley Trauma  - XXXX. He is on coumadin for AFiB and PE  PMH sig for CAD/Stents/AFib/PE/TAVR/Coumadin/CHF, XXX.   Today " XXX " is seen in consultation for above.   Past Medical History:  Diagnosis Date  . A-fib ([place])   . AAA (abdominal aortic aneurysm) ([place])    s/p repair  . Aortic stenosis, mild    a. mean gradient 17 mmHg on 04/2012 TTE  . Arthritis    knees  . CAD (coronary artery disease)    a. s/p multiple percutaneous prior coronary artery interventions b. 05/2012: unsuccessful PCI to tight RCA->medically managed  . Cancer [facility])    skin cancer removed, right hand, left leg, chest  . Carotid artery disease ([place])    0-39% bilateral ICA stenoses 11/2011  . Chronic anticoagulation    Coumadin  . Coronary artery disease   . GERD (gastroesophageal reflux disease)   . GERD (gastroesophageal reflux disease)   . History of pulmonary embolism    1964 related to dislocated hip on right   . History of skin cancer   . Hypercholesteremia   . Hypertension   . Incidental cecal mass noted on CT imaging 05/24/2016   **An incidental finding of potential clinical significance has been found. 4.6 x 5.6 x 3.7 cm masslike lesion in the cecum adjacent to the ileocecal valve may represent a large polyp or colonic neoplasm. Correlation with nonemergent colonoscopy is strongly recommended in the near future to better evaluate this finding.**  . Macular degeneration    eye injections  . Microscopic hematuria    Followed by urology  . Permanent atrial fibrillation ([place])   . PVD (peripheral vascular disease) ([place])    Bilateral renal artery  atherosclerosis, SMA stenosis with collateralization  . RBBB    Chronic  . 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 SSS (sick sinus syndrome) [facility])     Past Surgical History:  Procedure Laterality Date  . ABDOMINAL AORTIC ANEURYSM REPAIR     1993   . CARDIAC CATHETERIZATION  05/24/2012   pD1 20%, oCFX 20%, mCFX 30%, ostial Int Br 30%, distal AV groove CFX 30%, pRCA 30-40%, mRCA 99%, dRCA 30%, PL branch small with diffuse 40%. Unable to pass wire past RCA lesion->medically managed  . CHOLECYSTECTOMY N/A 12/05/2012   Procedure: LAPAROSCOPIC CHOLECYSTECTOMY ;  Surgeon: Edward Jolly, MD;  Location: [place];  Service: General;  Laterality: N/A;  . CHOLECYSTECTOMY    . CORONARY ANGIOPLASTY  1980, 1990  . CORONARY BALLOON ANGIOPLASTY N/A 08/31/2017   Procedure: CORONARY BALLOON ANGIOPLASTY;  Surgeon: Martin[place], Peter M, MD;  Location: [place] CV LAB;  Service: Cardiovascular;  Laterality: N/A;  . EYE SURGERY     hx of cataract surgery   . [place]  . HERNIA REPAIR     right inguinal hernia repair   . JOINT REPLACEMENT     left knee replacement, partial right  . LEFT HEART CATH AND CORONARY ANGIOGRAPHY N/A 08/31/2017  Procedure: LEFT HEART CATH AND CORONARY ANGIOGRAPHY;  Surgeon: Martinique, Peter M, MD;  Location: [place] CV LAB;  Service: Cardiovascular;  Laterality: N/A;  . LEFT HEART CATHETERIZATION WITH CORONARY ANGIOGRAM N/A 05/24/2012   Procedure: LEFT HEART CATHETERIZATION WITH CORONARY ANGIOGRAM;  Surgeon: Burnell Blanks, MD;  Location: [facility] CATH LAB;  Service: Cardiovascular;  Laterality: N/A;  . ORTHOPEDIC SURGERY    . OTHER SURGICAL HISTORY     right knee popliteal aneurysm surgery stent placed   . OTHER SURGICAL HISTORY    . PARTIAL KNEE ARTHROPLASTY  01/22/2012   Procedure: UNICOMPARTMENTAL KNEE;  Surgeon: Mauri Pole, MD;   Location: [facility];  Service: Orthopedics;  Laterality: Right;  . PERCUTANEOUS CORONARY INTERVENTION-BALLOON ONLY  05/24/2012   Procedure: PERCUTANEOUS CORONARY INTERVENTION-BALLOON ONLY;  Surgeon: Burnell Blanks, MD;  Location: [facility] CATH LAB;  Service: Cardiovascular;;  . RIGHT/LEFT HEART CATH AND CORONARY ANGIOGRAPHY N/A 05/12/2016   Procedure: Right/Left Heart Cath and Coronary Angiography;  Surgeon: Sherren Mocha, MD;  Location: [place] CV LAB;  Service: Cardiovascular;  Laterality: N/A;  . TEE WITHOUT CARDIOVERSION N/A 06/13/2016   Procedure: TRANSESOPHAGEAL ECHOCARDIOGRAM (TEE);  Surgeon: Sherren Mocha, MD;  Location: [place];  Service: Open Heart Surgery;  Laterality: N/A;  . TONSILLECTOMY    . TOTAL KNEE ARTHROPLASTY Left   . TRANSCATHETER AORTIC VALVE REPLACEMENT, TRANSFEMORAL N/A 06/13/2016   Procedure: TRANSCATHETER AORTIC VALVE REPLACEMENT, TRANSFEMORAL;  Surgeon: Sherren Mocha, MD;  Location: [place];  Service: Open Heart Surgery;  Laterality: N/A;    Family History  Problem Relation Age of Onset  . Heart attack Mother 55    Social History:  reports that he has never smoked. He has never used smokeless tobacco. He reports previous alcohol use. He reports that he does not use drugs.  Allergies:  Allergies  Allergen Reactions  . No Known Allergies Other (See Comments)    NKA    Medications: {medication reviewed/display:3041432}  Results for orders placed or performed during the hospital encounter of 06/11/20 (from the past 48 hour(s))  Comprehensive metabolic panel     Status: Abnormal   Collection Time: 06/12/20 12:27 AM  Result Value Ref Range   Sodium 136 135 - 145 mmol/L   Potassium 3.4 (L) 3.5 - 5.1 mmol/L   Chloride 107 98 - 111 mmol/L   CO2 22 22 - 32 mmol/L   Glucose, Bld 163 (H) 70 - 99 mg/dL    Comment: Glucose reference range applies only to samples taken after fasting for at least 8 hours.   BUN 21 8 - 23 mg/dL   Creatinine, Ser 0.89 0.61 - 1.24  mg/dL   Calcium 8.0 (L) 8.9 - 10.3 mg/dL   Total Protein 5.8 (L) 6.5 - 8.1 g/dL   Albumin 2.6 (L) 3.5 - 5.0 g/dL   AST 40 15 - 41 U/L   ALT 25 0 - 44 U/L   Alkaline Phosphatase 100 38 - 126 U/L   Total Bilirubin 1.1 0.3 - 1.2 mg/dL   GFR, Estimated >60 >60 mL/min   Anion gap 7 5 - 15    Comment: Performed at [facility], [place] [address], [place], [place] [address]  CBC with Differential     Status: Abnormal   Collection Time: 06/12/20 12:27 AM  Result Value Ref Range   WBC 3.2 (L) 4.0 - 10.5 K/uL   RBC 4.52 4.22 - 5.81 MIL/uL   Hemoglobin 14.0 13.0 - 17.0 g/dL   HCT 44.4 39.0 -  52.0 %   MCV 98.2 80.0 - 100.0 fL   MCH 31.0 26.0 - 34.0 pg   MCHC 31.5 30.0 - 36.0 g/dL   RDW 15.8 (H) 11.5 - 15.5 %   Platelets 100 (L) 150 - 400 K/uL    Comment: Immature Platelet Fraction may be clinically indicated, consider ordering this additional test [id number] REPEATED TO VERIFY PLATELET COUNT CONFIRMED BY SMEAR    nRBC 0.0 0.0 - 0.2 %   Neutrophils Relative % 63 %   Neutro Abs 2.0 1.7 - 7.7 K/uL   Lymphocytes Relative 23 %   Lymphs Abs 0.7 0.7 - 4.0 K/uL   Monocytes Relative 13 %   Monocytes Absolute 0.4 0.1 - 1.0 K/uL   Eosinophils Relative 0 %   Eosinophils Absolute 0.0 0.0 - 0.5 K/uL   Basophils Relative 1 %   Basophils Absolute 0.0 0.0 - 0.1 K/uL   Immature Granulocytes 0 %   Abs Immature Granulocytes 0.01 0.00 - 0.07 K/uL    Comment: Performed at [facility], [address] [place], [place] [address]  Troponin I (High Sensitivity)     Status: Abnormal   Collection Time: 06/12/20 12:27 AM  Result Value Ref Range   Troponin I (High Sensitivity) 103 (HH) <18 ng/L    Comment: CRITICAL RESULT CALLED TO, READ BACK BY AND VERIFIED WITH:  Jacklyn Shell RN @0136  06/12/20 K. SANDERS Performed at [facility], [place] [address], [place], [place] [address]   Brain natriuretic peptide     Status: Abnormal   Collection Time: 06/12/20 12:27 AM  Result Value Ref Range   B  Natriuretic Peptide 1,215.1 (H) 0.0 - 100.0 pg/mL    Comment: Performed at [facility] [address] [place], [place] [address]  Resp Panel by RT-PCR (Flu A&B, Covid) Nasopharyngeal Swab     Status: Abnormal   Collection Time: 06/12/20 12:27 AM   Specimen: Nasopharyngeal Swab; Nasopharyngeal(NP) swabs in vial transport medium  Result Value Ref Range   SARS Coronavirus 2 by RT PCR NEGATIVE NEGATIVE    Comment: (NOTE) SARS-CoV-2 target nucleic acids are NOT DETECTED.  The SARS-CoV-2 RNA is generally detectable in upper respiratory specimens during the acute phase of infection. The lowest concentration of SARS-CoV-2 viral copies this assay can detect is 138 copies/mL. A negative result does not preclude SARS-Cov-2 infection and should not be used as the sole basis for treatment or other patient management decisions. A negative result may occur with  improper specimen collection/handling, submission of specimen other than nasopharyngeal swab, presence of viral mutation(s) within the areas targeted by this assay, and inadequate number of viral copies(<138 copies/mL). A negative result must be combined with clinical observations, patient history, and epidemiological information. The expected result is Negative.  Fact Sheet for Patients:  EntrepreneurPulse.com.au  Fact Sheet for Healthcare Providers:  IncredibleEmployment.be  This test is no t yet approved or cleared by the [place] FDA and  has been authorized for detection and/or diagnosis of SARS-CoV-2 by FDA under an Emergency Use Authorization (EUA). This EUA will remain  in effect (meaning this test can be used) for the duration of the COVID-19 declaration under Section 564(b)(1) of the Act, 21 U.S.C.section 360bbb-3(b)(1), unless the authorization is terminated  or revoked sooner.       Influenza A by PCR POSITIVE (A) NEGATIVE   Influenza B by PCR NEGATIVE NEGATIVE    Comment:  (NOTE) The Xpert Xpress SARS-CoV-2/FLU/RSV plus assay is intended as  an aid in the diagnosis of influenza from Nasopharyngeal swab specimens and should not be used as a sole basis for treatment. Nasal washings and aspirates are unacceptable for Xpert Xpress SARS-CoV-2/FLU/RSV testing.  Fact Sheet for Patients: EntrepreneurPulse.com.au  Fact Sheet for Healthcare Providers: IncredibleEmployment.be  This test is not yet approved or cleared by the [place] FDA and has been authorized for detection and/or diagnosis of SARS-CoV-2 by FDA under an Emergency Use Authorization (EUA). This EUA will remain in effect (meaning this test can be used) for the duration of the COVID-19 declaration under Section 564(b)(1) of the Act, 21 U.S.C. section 360bbb-3(b)(1), unless the authorization is terminated or revoked.  Performed at [facility], [place] [address], [place], [place] [address]   Protime-INR     Status: Abnormal   Collection Time: 06/12/20 12:27 AM  Result Value Ref Range   Prothrombin Time 65.2 (H) 11.4 - 15.2 seconds   INR 7.7 (HH) 0.8 - 1.2    Comment: REPEATED TO VERIFY CRITICAL RESULT CALLED TO, READ BACK BY AND VERIFIED WITH: Jodene Nam, RN 06/12/2020 0146 BTAYLOR (NOTE) INR goal varies based on device and disease states. Performed at [facility], [place] [address], [place], [place] [address]   Troponin I (High Sensitivity)     Status: Abnormal   Collection Time: 06/12/20  2:39 AM  Result Value Ref Range   Troponin I (High Sensitivity) 117 (HH) <18 ng/L    Comment: CRITICAL VALUE NOTED.  VALUE IS CONSISTENT WITH PREVIOUSLY REPORTED AND CALLED VALUE. (NOTE) Elevated high sensitivity troponin I (hsTnI) values and significant  changes across serial measurements may suggest ACS but many other  chronic and acute conditions are known to elevate hsTnI results.  Refer to the Links section for chest pain algorithms and additional   guidance. Performed at [facility], [place] [address] [place], [place] [id number]   Basic metabolic panel     Status: Abnormal   Collection Time: 06/12/20  3:54 AM  Result Value Ref Range   Sodium 142 135 - 145 mmol/L   Potassium 3.4 (L) 3.5 - 5.1 mmol/L   Chloride 107 98 - 111 mmol/L   CO2 26 22 - 32 mmol/L   Glucose, Bld 87 70 - 99 mg/dL    Comment: Glucose reference range applies only to samples taken after fasting for at least 8 hours.   BUN 20 8 - 23 mg/dL   Creatinine, Ser 0.88 0.61 - 1.24 mg/dL   Calcium 8.5 (L) 8.9 - 10.3 mg/dL   GFR, Estimated >60 >60 mL/min    Comment: (NOTE) Calculated using the CKD-EPI Creatinine Equation (2021)    Anion gap 9 5 - 15    Comment: Performed at [facility] [address] [place], [place] [address]  CBC     Status: Abnormal   Collection Time: 06/12/20  3:54 AM  Result Value Ref Range   WBC 3.6 (L) 4.0 - 10.5 K/uL   RBC 4.70 4.22 - 5.81 MIL/uL   Hemoglobin 14.7 13.0 - 17.0 g/dL   HCT 45.4 39.0 - 52.0 %   MCV 96.6 80.0 - 100.0 fL   MCH 31.3 26.0 - 34.0 pg   MCHC 32.4 30.0 - 36.0 g/dL   RDW 15.9 (H) 11.5 - 15.5 %   Platelets 103 (L) 150 - 400 K/uL    Comment: Immature Platelet Fraction may be clinically indicated, consider ordering this additional test [id number] CONSISTENT WITH PREVIOUS RESULT REPEATED TO  VERIFY    nRBC 0.0 0.0 - 0.2 %    Comment: Performed at [facility], [place] [address], [place], [place] [address]  Magnesium     Status: None   Collection Time: 06/12/20  3:54 AM  Result Value Ref Range   Magnesium 2.0 1.7 - 2.4 mg/dL    Comment: Performed at [facility] [address], [place], [place] [address]  Procalcitonin     Status: None   Collection Time: 06/12/20  3:54 AM  Result Value Ref Range   Procalcitonin <0.10 ng/mL    Comment:        Interpretation: PCT (Procalcitonin) <= 0.5 ng/mL: Systemic infection (sepsis) is not likely. Local bacterial infection is possible. (NOTE)        Sepsis PCT Algorithm           Lower Respiratory Tract                                      Infection PCT Algorithm    ----------------------------     ----------------------------         PCT < 0.25 ng/mL                PCT < 0.10 ng/mL          Strongly encourage             Strongly discourage   discontinuation of antibiotics    initiation of antibiotics    ----------------------------     -----------------------------       PCT 0.25 - 0.50 ng/mL            PCT 0.10 - 0.25 ng/mL               OR       >80% decrease in PCT            Discourage initiation of                                            antibiotics      Encourage discontinuation           of antibiotics    ----------------------------     -----------------------------         PCT >= 0.50 ng/mL              PCT 0.26 - 0.50 ng/mL               AND        <80% decrease in PCT             Encourage initiation of                                             antibiotics       Encourage continuation           of antibiotics    ----------------------------     -----------------------------        PCT >= 0.50 ng/mL                  PCT > 0.50 ng/mL  AND         increase in PCT                  Strongly encourage                                      initiation of antibiotics    Strongly encourage escalation           of antibiotics                                     -----------------------------                                           PCT <= 0.25 ng/mL                                                 OR                                        > 80% decrease in PCT                                      Discontinue / Do not initiate                                             antibiotics  Performed at [facility], [address] [place], [place] [address]   Phosphorus     Status: None   Collection Time: 06/12/20  3:54 AM  Result Value Ref Range   Phosphorus 3.0 2.5 - 4.6 mg/dL    Comment: Performed  at [facility] [address], [place], [place] [address]  Troponin I (High Sensitivity)     Status: Abnormal   Collection Time: 06/12/20  3:54 AM  Result Value Ref Range   Troponin I (High Sensitivity) 105 (HH) <18 ng/L    Comment: CRITICAL VALUE NOTED.  VALUE IS CONSISTENT WITH PREVIOUSLY REPORTED AND CALLED VALUE. (NOTE) Elevated high sensitivity troponin I (hsTnI) values and significant  changes across serial measurements may suggest ACS but many other  chronic and acute conditions are known to elevate hsTnI results.  Refer to the Links section for chest pain algorithms and additional  guidance. Performed at [facility], [place] [address], [place], [place] [address]   Protime-INR     Status: Abnormal   Collection Time: 06/12/20  4:51 AM  Result Value Ref Range   Prothrombin Time 66.5 (H) 11.4 - 15.2 seconds   INR 7.9 (HH) 0.8 - 1.2    Comment: Kaleen Odea, RN 07/30/8370 0537 BTAYLOR CRITICAL RESULT CALLED TO, READ BACK BY AND VERIFIED WITH: REPEATED TO VERIFY Performed at [facility], [place] [address], [place], [place] [address]  CORRECTED ON 05/01 AT 0713: PREVIOUSLY REPORTED AS 7.9 JAHELLY FELICIANO, RN [id number] BTAYLOR CRITICAL RESULT CALLED TO, READ BACK BY AND VERIFIED WITH:, CORRECTED ON 05/01 AT 0705: PREVIOUSLY REPORTED AS 7.9 JAHELLY FELICIANO, RN [id number]  BTAYLOR   Troponin I (High Sensitivity)     Status: Abnormal   Collection Time: 06/12/20  5:32 AM  Result Value Ref Range   Troponin I (High Sensitivity) 111 (HH) <18 ng/L    Comment: CRITICAL VALUE NOTED.  VALUE IS CONSISTENT WITH PREVIOUSLY REPORTED AND CALLED VALUE. (NOTE) Elevated high sensitivity troponin I (hsTnI) values and significant  changes across serial measurements may suggest ACS but many other  chronic and acute conditions are known to elevate hsTnI results.  Refer to the Links section for chest pain algorithms and additional  guidance. Performed at [facility], [place] [address] [place], [place] [address]   Protime-INR     Status: Abnormal   Collection Time: 06/13/20  2:31 AM  Result Value Ref Range   Prothrombin Time 55.2 (H) 11.4 - 15.2 seconds   INR 6.3 (HH) 0.8 - 1.2    Comment: REPEATED TO VERIFY CRITICAL RESULT CALLED TO, READ BACK BY AND VERIFIED WITH: Kennieth Francois, RN 06/13/2020 0516 BTAYLOR (NOTE) INR goal varies based on device and disease states. Performed at [facility], [place] [address] [place], [place] [address]   CBC     Status: Abnormal   Collection Time: 06/13/20  2:31 AM  Result Value Ref Range   WBC 4.3 4.0 - 10.5 K/uL   RBC 4.89 4.22 - 5.81 MIL/uL   Hemoglobin 15.2 13.0 - 17.0 g/dL   HCT 46.4 39.0 - 52.0 %   MCV 94.9 80.0 - 100.0 fL   MCH 31.1 26.0 - 34.0 pg   MCHC 32.8 30.0 - 36.0 g/dL   RDW 15.8 (H) 11.5 - 15.5 %   Platelets 103 (L) 150 - 400 K/uL    Comment: Immature Platelet Fraction may be clinically indicated, consider ordering this additional test [id number] CONSISTENT WITH PREVIOUS RESULT DELTA CHECK NOTED    nRBC 0.0 0.0 - 0.2 %    Comment: Performed at [facility], [place] [address], [place], [place] [address]  Magnesium     Status: None   Collection Time: 06/13/20  2:31 AM  Result Value Ref Range   Magnesium 1.8 1.7 - 2.4 mg/dL    Comment: Performed at [facility] [address], [place], [place] [address]  Phosphorus     Status: None   Collection Time: 06/13/20  2:31 AM  Result Value Ref Range   Phosphorus 2.6 2.5 - 4.6 mg/dL    Comment: Performed at [facility], [place] [address] [place], [place] [id number]  Basic metabolic panel     Status: Abnormal   Collection Time: 06/13/20  2:31 AM  Result Value Ref Range   Sodium 138 135 - 145 mmol/L   Potassium 3.6 3.5 - 5.1 mmol/L   Chloride 102 98 - 111 mmol/L   CO2 27 22 - 32 mmol/L   Glucose, Bld 98 70 - 99 mg/dL    Comment: Glucose reference range applies only to samples taken after fasting for at least 8 hours.   BUN  16 8 - 23 mg/dL   Creatinine, Ser 0.83 0.61 - 1.24 mg/dL   Calcium 8.3 (L) 8.9 - 10.3 mg/dL   GFR, Estimated >60 >60 mL/min    Comment: (NOTE) Calculated  using the CKD-EPI Creatinine Equation (2021)    Anion gap 9 5 - 15    Comment: Performed at [facility], [place] [address], [place], [place] [address]    DG Chest Port 1 View  Result Date: 06/12/2020 CLINICAL DATA:  Shortness of breath. EXAM: PORTABLE CHEST 1 VIEW COMPARISON:  None. FINDINGS: Decreased lung volumes are seen which is, in part, secondary to the degree of patient inspiration. Mild to moderate severity areas of atelectasis and/or infiltrate are seen within the bilateral lung bases, left greater than right. A thin walled parenchymal cyst is noted within the lateral aspect of the right lung base. There is no evidence of a pleural effusion or pneumothorax. There is mild to moderate severity enlargement of the cardiac silhouette. An artificial aortic valve is seen with marked severity calcification of the aortic arch. The visualized skeletal structures are unremarkable. IMPRESSION: Stable cardiomegaly with mild to moderate severity bibasilar atelectasis and/or infiltrate. Electronically Signed   By: Virgina Norfolk M.D.   On: 06/12/2020 01:50   ECHOCARDIOGRAM COMPLETE  Result Date: 06/12/2020    ECHOCARDIOGRAM REPORT   Patient Name:   Tyler Williams Date of Exam: 06/12/2020 Medical Rec #:  [id number]     Height:       68.0 in Accession #:    [id number]    Weight:       185.0 lb Date of Birth:  [birth year]      BSA:          1.977 m Patient Age:    45 years      BP:           147/75 mmHg Patient Gender: M             HR:           71 bpm. Exam Location:  Inpatient Procedure: 2D Echo, Cardiac Doppler and Color Doppler Indications:    [id number] Acute diastolic (congestive) heart failure  History:        Patient has prior history of Echocardiogram examinations, most                 recent 07/29/2017. 29 mm Edwards bioprosthetic valve in the                  aortic position.                 Aortic Valve: 29 mm Sapien prosthetic, stented (TAVR) valve is                 present in the aortic position.  Sonographer:    Merrie Roof RDCS Referring Phys: [id number] [place]  1. Left ventricular ejection fraction, by estimation, is 45 to 50%. The left ventricle has mildly decreased function. The left ventricle demonstrates regional wall motion abnormalities (see scoring diagram/findings for description). There is mild concentric left ventricular hypertrophy. Left ventricular diastolic parameters are consistent with Grade II diastolic dysfunction (pseudonormalization). Elevated left atrial pressure. There is moderate hypokinesis of the left ventricular, basal-mid inferior wall and inferolateral wall.  2. Right ventricular systolic function is mildly reduced. The right ventricular size is normal. There is mildly elevated pulmonary artery systolic pressure.  3. Left atrial size was severely dilated.  4. Right atrial size was moderately dilated.  5. The mitral valve is normal in structure. Mild to moderate mitral valve regurgitation.  6. The aortic valve has been repaired/replaced. Aortic valve regurgitation is trivial. No aortic stenosis is present.  There is a 29 mm Sapien prosthetic (TAVR) valve present in the aortic position. Aortic valve mean gradient measures 6.0 mmHg. Aortic valve Vmax measures 1.82 m/s.  7. The inferior vena cava is dilated in size with <50% respiratory variability, suggesting right atrial pressure of 15 mmHg. Comparison(s): Prior images unable to be directly viewed, comparison made by report only. The left ventricular function is worsened. The left ventricular wall motion abnormality is unchanged. FINDINGS  Left Ventricle: Left ventricular ejection fraction, by estimation, is 45 to 50%. The left ventricle has mildly decreased function. The left ventricle demonstrates regional wall motion abnormalities. Moderate hypokinesis of the  left ventricular, basal-mid inferior wall and inferolateral wall. The left ventricular internal cavity size was normal in size. There is mild concentric left ventricular hypertrophy. Abnormal (paradoxical) septal motion consistent with post-operative status. Left ventricular diastolic parameters are consistent with Grade II diastolic dysfunction (pseudonormalization). Elevated left atrial pressure. Right Ventricle: The right ventricular size is normal. No increase in right ventricular wall thickness. Right ventricular systolic function is mildly reduced. There is mildly elevated pulmonary artery systolic pressure. The tricuspid regurgitant velocity  is 2.56 m/s, and with an assumed right atrial pressure of 15 mmHg, the estimated right ventricular systolic pressure is [id number] mmHg. Left Atrium: Left atrial size was severely dilated. Right Atrium: Right atrial size was moderately dilated. Pericardium: There is no evidence of pericardial effusion. Mitral Valve: The mitral valve is normal in structure. Mild to moderate mitral annular calcification. Mild to moderate mitral valve regurgitation, with centrally-directed jet. Tricuspid Valve: The tricuspid valve is normal in structure. Tricuspid valve regurgitation is mild. Aortic Valve: The aortic valve has been repaired/replaced. Aortic valve regurgitation is trivial. Aortic regurgitation PHT measures 422 msec. No aortic stenosis is present. Aortic valve mean gradient measures 6.0 mmHg. Aortic valve peak gradient measures  13.2 mmHg. Aortic valve area, by VTI measures 1.31 cm. There is a 29 mm Sapien prosthetic, stented (TAVR) valve present in the aortic position. Pulmonic Valve: The pulmonic valve was normal in structure. Pulmonic valve regurgitation is not visualized. Aorta: The aortic root and ascending aorta are structurally normal, with no evidence of dilitation. Venous: The inferior vena cava is dilated in size with less than 50% respiratory variability, suggesting  right atrial pressure of 15 mmHg. IAS/Shunts: No atrial level shunt detected by color flow Doppler.  LEFT VENTRICLE PLAX 2D LVIDd:         4.80 cm LVIDs:         3.80 cm LV PW:         1.40 cm LV IVS:        1.00 cm LVOT diam:     2.20 cm LV SV:         41 LV SV Index:   21 LVOT Area:     3.80 cm  RIGHT VENTRICLE            IVC RV Basal diam:  3.70 cm    IVC diam: 3.10 cm RV S prime:     6.85 cm/s TAPSE (M-mode): 1.0 cm LEFT ATRIUM            Index       RIGHT ATRIUM           Index LA diam:      5.70 cm  2.88 cm/m  RA Area:     29.30 cm LA Vol (A4C): 114.0 ml 57.66 ml/m RA Volume:   90.60 ml  45.82 ml/m  AORTIC VALVE AV  Area (Vmax):    1.38 cm AV Area (Vmean):   1.39 cm AV Area (VTI):     1.31 cm AV Vmax:           182.00 cm/s AV Vmean:          115.000 cm/s AV VTI:            0.316 m AV Peak Grad:      13.2 mmHg AV Mean Grad:      6.0 mmHg LVOT Vmax:         66.13 cm/s LVOT Vmean:        41.933 cm/s LVOT VTI:          0.109 m LVOT/AV VTI ratio: 0.34 AI PHT:            422 msec  AORTA Ao Root diam: 3.20 cm Ao Asc diam:  3.30 cm TRICUSPID VALVE TR Peak grad:   26.2 mmHg TR Vmax:        256.00 cm/s  SHUNTS Systemic VTI:  0.11 m Systemic Diam: 2.20 cm Mihai Croitoru MD Electronically signed by Sanda Klein MD Signature Date/Time: 06/12/2020/5:57:11 PM    Final     Review of Systems Blood pressure (!) 168/69, pulse 64, temperature (!) 97.4 F (36.3 C), temperature source Oral, resp. rate 16, height 5\' 8"  (1.727 m), weight 78 kg, SpO2 94 %. Physical Exam  Assessment/Plan:  Begin daily tamsulosin and fiansteride and continue at [place] current foley at discharge, rec reqeust Urol FU in abtou 1-2 weeks for voiding trial at discharge. He will likelky have on / off hematuria given coumadin use.   Alexis Frock 06/13/2020, 3:43 PM

## 2020-06-13 NOTE — Progress Notes (Addendum)
PROGRESS NOTE    Tyler Williams  [id number] DOB: Apr 22, 1926 DOA: 06/11/2020 PCP: Burnard Bunting, MD   Brief Narrative:  Tyler Williams is a 87 y.o. male with medical history significant for permanent A. fib on Coumadin, coronary artery disease, peripheral artery disease, AAA s/p repair, aortic stenosis status post TAVR in [id number], chronic diastolic CHF, chronic venous insufficiency who presented to [facility] ED due to worsening semiproductive cough of 4 to 5 days duration.  Associated with worsening bilateral lower extremity edema and generalized weakness.  He has been noncompliant with his Lasix due to increased urination.  ED course: Afebrile.  BP 160/91, pulse 63, respiration rate 22, O2 saturation 97% on room air.  Lab studies remarkable for serum potassium 3.4, serum glucose 163, BNP 1200, troponin 103, INR 7.7, WBC 3.2, hemoglobin 14.0, platelet 100, troponin 103.  The day of, INR supratherapeutic at 7.7.  Patient started on IV diuretics due to acute on chronic diastolic CHF.  [facility] consulted for admission for further management.  Assessment & Plan:  Acute on chronic combined systolic and diastolic CHF: -Secondary to noncompliance with Lasix.  Patient presented with signs of fluid overload-bilateral lower extremity edema, elevated BNP greater than 1200.  Reviewed chest x-ray. -Echo shows ejection fraction of 45 to 50% mild LVH, grade 2 diastolic dysfunction, mild to moderate MR, AR.  Left ventricle regional wall motion abnormalities. -Continue IV diuretics.  He is -1.6 L and -13lbs since admission -Strict INO's and daily weight. -Monitor electrolytes and renal function closely -Continue aspirin, statin, metoprolol, Imdur -will consult cardiology  Influenza A viral pneumonia with superimposed bacterial infection: -Reviewed chest x-ray.  Continue Tamiflu 30 mg twice daily for 5 days -Continue Rocephin -DuoNebs every 6 hours as needed for wheezing and shortness of  breath -Procalcitonin:<0.10 -Continue Mucinex twice daily, chest physiotherapy, incentive spirometry  Elevated troponin: History of coronary artery disease: -Likely demand ischemia in the setting of acute viral/bacterial illness -Reviewed EKG-no acute ischemic changes noted. -Patient denies ACS symptoms.  Echo as above.  Supratherapeutic INR: -Patient is on Coumadin due to underlying permanent A. Fib -No signs of active bleeding. -Monitor INR closely.  Hold Coumadin.  INR is trending down.   Hypertension: Blood pressure slightly elevated this morning -Continue ramipril, metoprolol, Imdur.  Permanent A. fib on Coumadin: -Continue Toprol -Hold Coumadin due to supratherapeutic INR  Generalized weakness: -In the setting of acute viral/bacterial infection -PT/OT -Falls precautions  Thrombocytopenia: Chronic -Platelet 103 -Continue to monitor  Acute urinary retention: -check UA, bladder scan-shows 600 cc of urine -Cont. Lisbeth Ply -Rn tried in & out cath multiple times with no success. -Will consult urology for further management.  Hypokalemia: Improving.  Potassium 3.6.  Replenished.  Repeat BMP tomorrow a.m.  Hypomagnesemia: Magnesium 1.8.  Replenished.  Repeat magnesium level tomorrow AM.  Hx of TAVR: aware -Reviewed echo  DVT prophylaxis: SCD.  No chemical prophylaxis due to supratherapeutic INR Code Status: Full code Family Communication: None present at bedside.  Plan of care discussed with patient in length and he verbalized understanding and agreed with it.  Discussed plan of care with patient's son on the phone and he verbalized understanding.  Disposition Plan: To be determined  Consultants:  Cardiology urology  Procedures:  None Antimicrobials:   Rocephin  Status is: Inpatient   Dispo: The patient is from: ALF              Anticipated d/c is to: ALF  Patient currently is not medically stable to d/c.   Difficult to place patient  No    Subjective: Patient seen and examined.  Sitting at the side of the bed.  Reports improvement in breathing and leg swelling.  No fever, chills.  Denies chest pain, palpitation, orthopnea or PND.  No acute events overnight.  Objective: Vitals:   06/12/20 2121 06/13/20 0307 06/13/20 0425 06/13/20 0919  BP: 140/69  (!) 168/69   Pulse: 74  64   Resp: 16  16   Temp: 98.3 F (36.8 C)  (!) 97.4 F (36.3 C)   TempSrc: Oral  Oral   SpO2: 92%  94% 96%  Weight:  78 kg    Height:        Intake/Output Summary (Last 24 hours) at 06/13/2020 1249 Last data filed at 06/13/2020 1039 Gross per 24 hour  Intake 1300 ml  Output 2150 ml  Net -850 ml   Filed Weights   06/12/20 0013 06/13/20 0307  Weight: 83.9 kg 78 kg    Examination:  General exam: Appears calm and comfortable, on room air, elderly looking, appears  weak Respiratory system: Bilateral rhonchi noted, mild expiratory wheezing noted. cardiovascular system: S1 & S2 heard, RRR. No JVD, murmurs, rubs, gallops or clicks.  2+ pitting edema positive Gastrointestinal system: Abdomen is nondistended, soft and nontender. No organomegaly or masses felt. Normal bowel sounds heard. Central nervous system: Alert and oriented. No focal neurological deficits. Extremities: Symmetric 5 x 5 power.chronic venous stasis Skin: No rashes, lesions or ulcers Psychiatry: Judgement and insight appear normal. Mood & affect appropriate.    Data Reviewed: I have personally reviewed following labs and imaging studies  CBC: Recent Labs  Lab 06/12/20 0027 06/12/20 0354 06/13/20 0231  WBC 3.2* 3.6* 4.3  NEUTROABS 2.0  --   --   HGB 14.0 14.7 15.2  HCT 44.4 45.4 46.4  MCV 98.2 96.6 94.9  PLT 100* 103* [id number]*   Basic Metabolic Panel: Recent Labs  Lab 06/12/20 0027 06/12/20 0354 06/13/20 0231  NA 136 142 138  K 3.4* 3.4* 3.6  CL 107 107 102  CO2 22 26 27   GLUCOSE 163* 87 98  BUN 21 20 16   CREATININE 0.89 0.88 0.83  CALCIUM 8.0* 8.5* 8.3*   MG  --  2.0 1.8  PHOS  --  3.0 2.6   GFR: Estimated Creatinine Clearance: 54.9 mL/min (by C-G formula based on SCr of 0.83 mg/dL). Liver Function Tests: Recent Labs  Lab 06/12/20 0027  AST 40  ALT 25  ALKPHOS 100  BILITOT 1.1  PROT 5.8*  ALBUMIN 2.6*   No results for input(s): LIPASE, AMYLASE in the last 168 hours. No results for input(s): AMMONIA in the last 168 hours. Coagulation Profile: Recent Labs  Lab 06/12/20 0027 06/12/20 0451 06/13/20 0231  INR 7.7* 7.9* 6.3*   Cardiac Enzymes: No results for input(s): CKTOTAL, CKMB, CKMBINDEX, TROPONINI in the last 168 hours. BNP (last 3 results) No results for input(s): PROBNP in the last 8760 hours. HbA1C: No results for input(s): HGBA1C in the last 72 hours. CBG: No results for input(s): GLUCAP in the last 168 hours. Lipid Profile: No results for input(s): CHOL, HDL, LDLCALC, TRIG, CHOLHDL, LDLDIRECT in the last 72 hours. Thyroid Function Tests: No results for input(s): TSH, T4TOTAL, FREET4, T3FREE, THYROIDAB in the last 72 hours. Anemia Panel: No results for input(s): VITAMINB12, FOLATE, FERRITIN, TIBC, IRON, RETICCTPCT in the last 72 hours. Sepsis Labs: Recent Labs  Lab 06/12/20 0354  PROCALCITON <0.10    Recent Results (from the past 240 hour(s))  Resp Panel by RT-PCR (Flu A&B, Covid) Nasopharyngeal Swab     Status: Abnormal   Collection Time: 06/12/20 12:27 AM   Specimen: Nasopharyngeal Swab; Nasopharyngeal(NP) swabs in vial transport medium  Result Value Ref Range Status   SARS Coronavirus 2 by RT PCR NEGATIVE NEGATIVE Final    Comment: (NOTE) SARS-CoV-2 target nucleic acids are NOT DETECTED.  The SARS-CoV-2 RNA is generally detectable in upper respiratory specimens during the acute phase of infection. The lowest concentration of SARS-CoV-2 viral copies this assay can detect is 138 copies/mL. A negative result does not preclude SARS-Cov-2 infection and should not be used as the sole basis for treatment  or other patient management decisions. A negative result may occur with  improper specimen collection/handling, submission of specimen other than nasopharyngeal swab, presence of viral mutation(s) within the areas targeted by this assay, and inadequate number of viral copies(<138 copies/mL). A negative result must be combined with clinical observations, patient history, and epidemiological information. The expected result is Negative.  Fact Sheet for Patients:  EntrepreneurPulse.com.au  Fact Sheet for Healthcare Providers:  IncredibleEmployment.be  This test is no t yet approved or cleared by the [place] FDA and  has been authorized for detection and/or diagnosis of SARS-CoV-2 by FDA under an Emergency Use Authorization (EUA). This EUA will remain  in effect (meaning this test can be used) for the duration of the COVID-19 declaration under Section 564(b)(1) of the Act, 21 U.S.C.section 360bbb-3(b)(1), unless the authorization is terminated  or revoked sooner.       Influenza A by PCR POSITIVE (A) NEGATIVE Final   Influenza B by PCR NEGATIVE NEGATIVE Final    Comment: (NOTE) The Xpert Xpress SARS-CoV-2/FLU/RSV plus assay is intended as an aid in the diagnosis of influenza from Nasopharyngeal swab specimens and should not be used as a sole basis for treatment. Nasal washings and aspirates are unacceptable for Xpert Xpress SARS-CoV-2/FLU/RSV testing.  Fact Sheet for Patients: EntrepreneurPulse.com.au  Fact Sheet for Healthcare Providers: IncredibleEmployment.be  This test is not yet approved or cleared by the [place] FDA and has been authorized for detection and/or diagnosis of SARS-CoV-2 by FDA under an Emergency Use Authorization (EUA). This EUA will remain in effect (meaning this test can be used) for the duration of the COVID-19 declaration under Section 564(b)(1) of the Act, 21  U.S.C. section 360bbb-3(b)(1), unless the authorization is terminated or revoked.  Performed at [facility], [place] [address] [place], [place] [address]       Radiology Studies: DG Chest Port 1 View  Result Date: 06/12/2020 CLINICAL DATA:  Shortness of breath. EXAM: PORTABLE CHEST 1 VIEW COMPARISON:  None. FINDINGS: Decreased lung volumes are seen which is, in part, secondary to the degree of patient inspiration. Mild to moderate severity areas of atelectasis and/or infiltrate are seen within the bilateral lung bases, left greater than right. A thin walled parenchymal cyst is noted within the lateral aspect of the right lung base. There is no evidence of a pleural effusion or pneumothorax. There is mild to moderate severity enlargement of the cardiac silhouette. An artificial aortic valve is seen with marked severity calcification of the aortic arch. The visualized skeletal structures are unremarkable. IMPRESSION: Stable cardiomegaly with mild to moderate severity bibasilar atelectasis and/or infiltrate. Electronically Signed   By: Virgina Norfolk M.D.   On: 06/12/2020 01:50   ECHOCARDIOGRAM COMPLETE  Result Date: 06/12/2020  ECHOCARDIOGRAM REPORT   Patient Name:   FOCH ROSENWALD Date of Exam: 06/12/2020 Medical Rec #:  [id number]     Height:       68.0 in Accession #:    [id number]    Weight:       185.0 lb Date of Birth:  [birth year]      BSA:          1.977 m Patient Age:    60 years      BP:           147/75 mmHg Patient Gender: M             HR:           71 bpm. Exam Location:  Inpatient Procedure: 2D Echo, Cardiac Doppler and Color Doppler Indications:    X32.35 Acute diastolic (congestive) heart failure  History:        Patient has prior history of Echocardiogram examinations, most                 recent 07/29/2017. 29 mm Edwards bioprosthetic valve in the                 aortic position.                 Aortic Valve: 29 mm Sapien prosthetic, stented (TAVR) valve is                  present in the aortic position.  Sonographer:    Merrie Roof RDCS Referring Phys: [id number] [place]  1. Left ventricular ejection fraction, by estimation, is 45 to 50%. The left ventricle has mildly decreased function. The left ventricle demonstrates regional wall motion abnormalities (see scoring diagram/findings for description). There is mild concentric left ventricular hypertrophy. Left ventricular diastolic parameters are consistent with Grade II diastolic dysfunction (pseudonormalization). Elevated left atrial pressure. There is moderate hypokinesis of the left ventricular, basal-mid inferior wall and inferolateral wall.  2. Right ventricular systolic function is mildly reduced. The right ventricular size is normal. There is mildly elevated pulmonary artery systolic pressure.  3. Left atrial size was severely dilated.  4. Right atrial size was moderately dilated.  5. The mitral valve is normal in structure. Mild to moderate mitral valve regurgitation.  6. The aortic valve has been repaired/replaced. Aortic valve regurgitation is trivial. No aortic stenosis is present. There is a 29 mm Sapien prosthetic (TAVR) valve present in the aortic position. Aortic valve mean gradient measures 6.0 mmHg. Aortic valve Vmax measures 1.82 m/s.  7. The inferior vena cava is dilated in size with <50% respiratory variability, suggesting right atrial pressure of 15 mmHg. Comparison(s): Prior images unable to be directly viewed, comparison made by report only. The left ventricular function is worsened. The left ventricular wall motion abnormality is unchanged. FINDINGS  Left Ventricle: Left ventricular ejection fraction, by estimation, is 45 to 50%. The left ventricle has mildly decreased function. The left ventricle demonstrates regional wall motion abnormalities. Moderate hypokinesis of the left ventricular, basal-mid inferior wall and inferolateral wall. The left ventricular internal cavity size was normal in  size. There is mild concentric left ventricular hypertrophy. Abnormal (paradoxical) septal motion consistent with post-operative status. Left ventricular diastolic parameters are consistent with Grade II diastolic dysfunction (pseudonormalization). Elevated left atrial pressure. Right Ventricle: The right ventricular size is normal. No increase in right ventricular wall thickness. Right ventricular systolic function is mildly reduced. There is mildly elevated pulmonary artery systolic pressure. The  tricuspid regurgitant velocity  is 2.56 m/s, and with an assumed right atrial pressure of 15 mmHg, the estimated right ventricular systolic pressure is 76.1 mmHg. Left Atrium: Left atrial size was severely dilated. Right Atrium: Right atrial size was moderately dilated. Pericardium: There is no evidence of pericardial effusion. Mitral Valve: The mitral valve is normal in structure. Mild to moderate mitral annular calcification. Mild to moderate mitral valve regurgitation, with centrally-directed jet. Tricuspid Valve: The tricuspid valve is normal in structure. Tricuspid valve regurgitation is mild. Aortic Valve: The aortic valve has been repaired/replaced. Aortic valve regurgitation is trivial. Aortic regurgitation PHT measures 422 msec. No aortic stenosis is present. Aortic valve mean gradient measures 6.0 mmHg. Aortic valve peak gradient measures  13.2 mmHg. Aortic valve area, by VTI measures 1.31 cm. There is a 29 mm Sapien prosthetic, stented (TAVR) valve present in the aortic position. Pulmonic Valve: The pulmonic valve was normal in structure. Pulmonic valve regurgitation is not visualized. Aorta: The aortic root and ascending aorta are structurally normal, with no evidence of dilitation. Venous: The inferior vena cava is dilated in size with less than 50% respiratory variability, suggesting right atrial pressure of 15 mmHg. IAS/Shunts: No atrial level shunt detected by color flow Doppler.  LEFT VENTRICLE PLAX 2D  LVIDd:         4.80 cm LVIDs:         3.80 cm LV PW:         1.40 cm LV IVS:        1.00 cm LVOT diam:     2.20 cm LV SV:         41 LV SV Index:   21 LVOT Area:     3.80 cm  RIGHT VENTRICLE            IVC RV Basal diam:  3.70 cm    IVC diam: 3.10 cm RV S prime:     6.85 cm/s TAPSE (M-mode): 1.0 cm LEFT ATRIUM            Index       RIGHT ATRIUM           Index LA diam:      5.70 cm  2.88 cm/m  RA Area:     29.30 cm LA Vol (A4C): 114.0 ml 57.66 ml/m RA Volume:   90.60 ml  45.82 ml/m  AORTIC VALVE AV Area (Vmax):    1.38 cm AV Area (Vmean):   1.39 cm AV Area (VTI):     1.31 cm AV Vmax:           182.00 cm/s AV Vmean:          115.000 cm/s AV VTI:            0.316 m AV Peak Grad:      13.2 mmHg AV Mean Grad:      6.0 mmHg LVOT Vmax:         66.13 cm/s LVOT Vmean:        41.933 cm/s LVOT VTI:          0.109 m LVOT/AV VTI ratio: 0.34 AI PHT:            422 msec  AORTA Ao Root diam: 3.20 cm Ao Asc diam:  3.30 cm TRICUSPID VALVE TR Peak grad:   26.2 mmHg TR Vmax:        256.00 cm/s  SHUNTS Systemic VTI:  0.11 m Systemic Diam: 2.20 cm Sanda Klein MD Electronically signed  by Sanda Klein MD Signature Date/Time: 06/12/2020/5:57:11 PM    Final     Scheduled Meds: . aspirin EC  81 mg Oral Daily  . atorvastatin  20 mg Oral Daily  . fesoterodine  8 mg Oral QPM  . furosemide  40 mg Intravenous BID  . guaiFENesin  1,200 mg Oral BID  . ipratropium-albuterol  3 mL Nebulization TID  . isosorbide mononitrate  120 mg Oral Daily  . metoprolol succinate  25 mg Oral Daily  . oseltamivir  30 mg Oral BID  . potassium chloride  40 mEq Oral BID  . ramipril  10 mg Oral BID  . sodium chloride HYPERTONIC  4 mL Nebulization Daily  . Warfarin - Pharmacist Dosing Inpatient   Does not apply q1600   Continuous Infusions: . [START ON 06/14/2020] cefTRIAXone (ROCEPHIN)  IV       LOS: 1 day   Time spent: 40 minutes   Kess Mcilwain Loann Quill, MD [facility]  If 7PM-7AM, please contact  night-coverage [email or web address] 06/13/2020, 12:49 PM

## 2020-06-13 NOTE — Consult Note (Signed)
Cardiology Consultation:   Patient ID: Tyler Williams MRN: [id number]; DOB: Jul 09, 1926  Admit date: 06/11/2020 Date of Consult: 06/13/2020  PCP:  Burnard Bunting, MD   [facility]  Cardiologist:  Sherren Mocha, MD  Advanced Practice Provider:  No care team member to display Electrophysiologist:  None   Patient Profile:   Tyler Williams is a 87 y.o. male with a PMH of CAD s/p POBA to RCA remotely, permanent atrial fibrillation on coumadin, chronic diastolic CHF, aortic stenosis s/p TAVR in 2018, AAA s/p repair, chronic venous insufficiency, HTN, HLD, who is being seen today for the evaluation of abnormal echocardiogram at the request of Dr. Doristine Bosworth.  History of Present Illness:   Mr. Monte was in his usual state of health until ~ 1 week ago when he began experiencing cough, generalized weakness, and worsening LE edema. Given worsening symptoms, EMS was activated and patient was brought to [facility] ED for further evaluation.    He was last evaluated by cardiology at an outpatient visit with Dr. Burt Knack 03/2020, at which time he had no complaints of chest pain or SOB, however did note chronic LE edema and more recent dizziness. He was anticipating a move to [place] in the near future. His amlodipine was discontinued at that visit in an effort to improve his LE edema and he was recommended to follow-up in 4 months. His last ischemic evaluation was a LHC in 2019 which showed severe mRCA stenosis which was medically managed after unsuccessful PCI attempt due to small vessel size, otherwise 30% LM stenosis. Prior to this admission his last echo in 2019 showed EF 55-60%, mild LVH, akinesis of the basal inferolateral myocardium, stable aortic valve s/p TAVR, severe LAE, and moderate RAE.   Hospital course: Hypertensive, initially tachypneic, otherwise VSS. Labs notable for K 3.4, Cr 0.88, albumin 2.6, Hgb 14, PLT 100, INR 7.7>7.9>6.3, HsTrop 103>117>105>111, BNP 1200.  COVID-19 negative, though influenza A positive. CXR showed mild-moderate bibasilar atelectasis and/or infiltrate. EKG showed atrial fibrillation with rate 59 bpm, chronic RBBB, no STE/D; no significant change from previous. Patient was admitted to medicine for influenza A with c/f superimposed bacterial PNA. He was started on tamiflu and CTX. He was started on IV lasix 20mg  BID for management of his CHF. Echo this admission showed EF 45-50%, RWMA in basal-mid, inferior, and inferolateral wall unchanged from previous, mild concentric LVH, G2DD, mildly reduced RV systolic function, severe LAE, moderate RAE, mild-moderate MR, and stable aortic valve s/p TAVR. Cardiology asked to evaluate for CHF.   At the time of this evaluation he is quite uncomfortable due to urinary retention and 2 failed attempts to place a foley catheter. Urology has been called. Patient reports for the past week he has self adjusted his home lasix regimen, often not taking a full dose as prescribed due to urinary frequency. As such he has had increased LE edema and cough. He denies any chest pain or palpitations. He is laying relatively flat in bed at this time without significant SOB. He notes quite a bit of improvement in his breathing since arrival.    Past Medical History:  Diagnosis Date  . A-fib ([place])   . AAA (abdominal aortic aneurysm) ([place])    s/p repair  . Aortic stenosis, mild    a. mean gradient 17 mmHg on 04/2012 TTE  . Arthritis    knees  . CAD (coronary artery disease)    a. s/p multiple percutaneous prior coronary artery interventions b. 05/2012:  unsuccessful PCI to tight RCA->medically managed  . [facility])    skin cancer removed, right hand, left leg, chest  . Carotid artery disease ([place])    0-39% bilateral ICA stenoses 11/2011  . Chronic anticoagulation    Coumadin  . Coronary artery disease   . GERD (gastroesophageal reflux disease)   . GERD (gastroesophageal reflux disease)   . History of pulmonary  embolism    1964 related to dislocated hip on right   . History of skin cancer   . Hypercholesteremia   . Hypertension   . Incidental cecal mass noted on CT imaging 05/24/2016   **An incidental finding of potential clinical significance has been found. 4.6 x 5.6 x 3.7 cm masslike lesion in the cecum adjacent to the ileocecal valve may represent a large polyp or colonic neoplasm. Correlation with nonemergent colonoscopy is strongly recommended in the near future to better evaluate this finding.**  . Macular degeneration    eye injections  . Microscopic hematuria    Followed by urology  . Permanent atrial fibrillation ([place])   . PVD (peripheral vascular disease) ([place])    Bilateral renal artery atherosclerosis, SMA stenosis with collateralization  . RBBB    Chronic  . 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 [facility]     Past Surgical History:  Procedure Laterality Date  . ABDOMINAL AORTIC ANEURYSM REPAIR     1993   . CARDIAC CATHETERIZATION  05/24/2012   pD1 20%, oCFX 20%, mCFX 30%, ostial Int Br 30%, distal AV groove CFX 30%, pRCA 30-40%, mRCA 99%, dRCA 30%, PL branch small with diffuse 40%. Unable to pass wire past RCA lesion->medically managed  . CHOLECYSTECTOMY N/A 12/05/2012   Procedure: LAPAROSCOPIC CHOLECYSTECTOMY ;  Surgeon: Edward Jolly, MD;  Location: [place];  Service: General;  Laterality: N/A;  . CHOLECYSTECTOMY    . CORONARY ANGIOPLASTY  1980, 1990  . CORONARY BALLOON ANGIOPLASTY N/A 08/31/2017   Procedure: CORONARY BALLOON ANGIOPLASTY;  Surgeon: Martinique, Peter M, MD;  Location: [place] CV [facility];  Service: Cardiovascular;  Laterality: N/A;  . EYE SURGERY     hx of cataract surgery   . [place]  . HERNIA REPAIR     right inguinal hernia repair   . JOINT REPLACEMENT     left knee replacement, partial  right  . LEFT HEART CATH AND CORONARY ANGIOGRAPHY N/A 08/31/2017   Procedure: LEFT HEART CATH AND CORONARY ANGIOGRAPHY;  Surgeon: Martinique, Peter M, MD;  Location: [place] [facility];  Service: Cardiovascular;  Laterality: N/A;  . LEFT HEART CATHETERIZATION WITH CORONARY ANGIOGRAM N/A 05/24/2012   Procedure: LEFT HEART CATHETERIZATION WITH CORONARY ANGIOGRAM;  Surgeon: Burnell Blanks, MD;  Location: [facility] CATH LAB;  Service: Cardiovascular;  Laterality: N/A;  . ORTHOPEDIC SURGERY    . OTHER SURGICAL HISTORY     right knee popliteal aneurysm surgery stent placed   . OTHER SURGICAL HISTORY    . PARTIAL KNEE ARTHROPLASTY  01/22/2012   Procedure: UNICOMPARTMENTAL KNEE;  Surgeon: Mauri Pole, MD;  Location: [facility];  Service: Orthopedics;  Laterality: Right;  . PERCUTANEOUS CORONARY INTERVENTION-BALLOON ONLY  05/24/2012   Procedure: PERCUTANEOUS CORONARY INTERVENTION-BALLOON ONLY;  Surgeon: Burnell Blanks, MD;  Location: [facility] CATH LAB;  Service: Cardiovascular;;  . RIGHT/LEFT HEART CATH AND CORONARY ANGIOGRAPHY N/A 05/12/2016   Procedure: Right/Left  Heart Cath and Coronary Angiography;  Surgeon: Sherren Mocha, MD;  Location: [place] [facility];  Service: Cardiovascular;  Laterality: N/A;  . TEE WITHOUT CARDIOVERSION N/A 06/13/2016   Procedure: TRANSESOPHAGEAL ECHOCARDIOGRAM (TEE);  Surgeon: Sherren Mocha, MD;  Location: [place];  Service: Open Heart Surgery;  Laterality: N/A;  . TONSILLECTOMY    . TOTAL KNEE ARTHROPLASTY Left   . TRANSCATHETER AORTIC VALVE REPLACEMENT, TRANSFEMORAL N/A 06/13/2016   Procedure: TRANSCATHETER AORTIC VALVE REPLACEMENT, TRANSFEMORAL;  Surgeon: Sherren Mocha, MD;  Location: [place];  Service: Open Heart Surgery;  Laterality: N/A;     Home Medications:  Prior to Admission medications   Medication Sig Start Date End Date Taking? Authorizing Provider  aspirin EC 81 MG tablet Take 81 mg by mouth daily.   Yes [provider]  atorvastatin (LIPITOR) 20 MG  tablet TAKE 1 TABLET ONCE DAILY. Patient taking differently: Take 20 mg by mouth at bedtime. 04/09/20  Yes Sherren Mocha, MD  Calcium-Magnesium-Zinc (CAL-MAG-ZINC PO) Take 1 tablet by mouth daily.   Yes [provider]  Cholecalciferol (VITAMIN D3) 5000 units TABS Take 5,000 Units by mouth daily.   Yes [provider]  Coenzyme Q10 (COQ10) 100 MG CAPS Take 100 mg by mouth daily.   Yes [provider]  fesoterodine (TOVIAZ) 8 MG TB24 tablet Take 8 mg by mouth every evening.   Yes [provider]  furosemide (LASIX) 80 MG tablet TAKE 1 TABLET ONCE DAILY. Patient taking differently: Take 80 mg by mouth daily. 01/12/20  Yes Sherren Mocha, MD  isosorbide mononitrate (IMDUR) 60 MG 24 hr tablet TAKE (2) TABLETS BY MOUTH DAILY. Patient taking differently: Take 120 mg by mouth daily. 04/06/20  Yes Sherren Mocha, MD  LORazepam (ATIVAN) 1 MG tablet Take 1 mg by mouth at bedtime.   Yes [provider]  metoprolol succinate (TOPROL-XL) 25 MG 24 hr tablet Take 1 tablet (25 mg total) by mouth daily. 10/10/19  Yes Weaver, Scott T, PA-C  Multiple Vitamins-Minerals (PRESERVISION AREDS) CAPS Take 1 tablet by mouth daily. 03/04/09  Yes [provider]  nitroGLYCERIN (NITROSTAT) 0.4 MG SL tablet Place 0.4 mg under the tongue every 5 (five) minutes as needed for chest pain.   Yes [provider]  Polyethyl Glycol-Propyl Glycol (SYSTANE OP) Place 2 drops into both eyes 2 (two) times daily as needed.   Yes [provider]  polyethylene glycol powder (GLYCOLAX/MIRALAX) 17 GM/SCOOP powder Take 17 g by mouth daily as needed for mild constipation.   Yes [provider]  ramipril (ALTACE) 10 MG capsule Take 10 mg by mouth 2 (two) times daily.   Yes [provider]  ranibizumab (LUCENTIS) 0.5 MG/0.05ML SOLN 0.5 mg by Intravitreal route See admin instructions. Every 9 weeks   Yes [provider]  warfarin (COUMADIN) 5 MG tablet  Take 5 mg by mouth daily.   Yes [provider]    Inpatient Medications: Scheduled Meds: . aspirin EC  81 mg Oral Daily  . atorvastatin  20 mg Oral Daily  . fesoterodine  8 mg Oral QPM  . finasteride  5 mg Oral Daily  . furosemide  40 mg Intravenous BID  . guaiFENesin  1,200 mg Oral BID  . ipratropium-albuterol  3 mL Nebulization TID  . isosorbide mononitrate  120 mg Oral Daily  . metoprolol succinate  25 mg Oral Daily  . oseltamivir  30 mg Oral BID  . potassium chloride  40 mEq Oral BID  . ramipril  10 mg Oral BID  . sodium chloride HYPERTONIC  4 mL Nebulization Daily  . tamsulosin  0.4 mg Oral Daily  . Warfarin - Pharmacist Dosing Inpatient   Does not apply q1600   Continuous Infusions: . [START ON 06/14/2020] cefTRIAXone (ROCEPHIN)  IV     PRN Meds: HYDROmorphone (DILAUDID) injection, LORazepam, nitroGLYCERIN, polyethylene glycol  Allergies:    Allergies  Allergen Reactions  . No Known Allergies Other (See Comments)    NKA    Social History:   Social History   Socioeconomic History  . Marital status: Widowed    Spouse name: Not on file  . Number of children: Not on file  . Years of education: Not on file  . Highest education level: Not on file  Occupational History  . Not on file  Tobacco Use  . Smoking status: Never Smoker  . Smokeless tobacco: Never Used  Vaping Use  . Vaping Use: Never used  Substance and Sexual Activity  . Alcohol use: Not Currently    Comment: patient doesn't drink anymore  . Drug use: Never  . Sexual activity: Not on file  Other Topics Concern  . Not on file  Social History Narrative   ** Merged History Encounter **       [employer]; played football; score touchdown against [employer] Determinants of Health   Financial Resource Strain: Not on file  Food Insecurity: Not on file  Transportation Needs: Not on file  Physical Activity: Not on file  Stress: Not on file  Social Connections: Not on file   Intimate Partner Violence: Not on file    Family History:    Family History  Problem Relation Age of Onset  . Heart attack Mother 71     ROS:  Please see the history of present illness.   All other ROS reviewed and negative.     Physical Exam/Data:   Vitals:   06/13/20 0307 06/13/20 0425 06/13/20 0919 06/13/20 1402  BP:  (!) 168/69    Pulse:  64    Resp:  16    Temp:  (!) 97.4 F (36.3 C)    TempSrc:  Oral    SpO2:  94% 96% 94%  Weight: 78 kg     Height:        Intake/Output Summary (Last 24 hours) at 06/13/2020 1620 Last data filed at 06/13/2020 1350 Gross per 24 hour  Intake 960 ml  Output 2300 ml  Net -1340 ml   Last 3 Weights 06/13/2020 06/12/2020 04/12/2020  Weight (lbs) 172 lb 185 lb 189 lb 12.8 oz  Weight (kg) 78.019 kg 83.915 kg 86.093 kg     Body mass index is 26.15 kg/m.  General:  Elderly gentleman laying in bed in no acute distress HEENT: sclera anicteric Neck: no JVD Vascular: No carotid bruits; distal pulses 2+ bilaterally  Cardiac:  normal S1, S2; RRR; no murmurs, rubs, or gallops Lungs:  clear to auscultation bilaterally, no wheezing, rhonchi or rales  Abd: soft, nontender, no hepatomegaly  Ext: 3+ LE edema, extending up to thighs Musculoskeletal:  No deformities, BUE and BLE strength normal and equal Skin: warm and dry  Neuro:  CNs 2-12 intact, no focal abnormalities noted Psych:  Normal affect   EKG:  The EKG was personally reviewed and demonstrates:   atrial fibrillation with rate 59 bpm, chronic RBBB, no STE/D; no significant change from previous.   Telemetry:  Telemetry was personally reviewed and demonstrates:  Atrial fibrillation with CVR, occasional PVCs  Relevant CV Studies: Echocardiogram 06/12/20:   Echocardiogram 2019: - Left ventricle: The cavity size was normal. Wall thickness was  increased in a pattern of mild LVH. Systolic function was normal.  The estimated ejection fraction was in the range of 55% to 60%.  There is  akinesis of the basalinferolateral myocardium.  - Aortic valve: A bioprosthesis was present. There was trivial  regurgitation.  - Mitral valve: Calcified annulus. There was mild regurgitation.  - Left atrium: The atrium was severely dilated.  - Right atrium: The atrium was moderately dilated.  - Pulmonary arteries: Systolic pressure was mildly increased. PA  peak pressure: 43 mm Hg (S).   Impressions:   - Akinesis of the inferolateral wall with overall preserved LV  systolic function; s/p AVR with trace AI; mild MR; biatrial  enlargement; trace TR with mild pulmonary hypertension.   [place] 2019:  Mid RCA-1 lesion is 95% stenosed.  Mid RCA-2 lesion is 85% stenosed.  Mid LM lesion is 30% stenosed.  Post intervention, there is a 90% residual stenosis.  Balloon angioplasty was performed using a BALLOON SAPPHIRE 2.5X12.  LV end diastolic pressure is normal.   1. Single vessel obstructive CAD involving the mid RCA 2. Normal LVEDP 3. No AV gradient.  4. Unsuccessful PCI of the RCA due to inability to expand the lesion. I do not feel that any other attempt at opening the RCA is feasible and would continue medical management.   Recommend to resume Warfarin, at currently prescribed dose and frequency, on 09/01/17.  Recommend concurrent antiplatelet therapy of Aspirin 81mg  daily for 1 month.  Diagnostic Dominance: Right    Intervention     Laboratory Data:  High Sensitivity Troponin:   Recent Labs  Lab 06/12/20 0027 06/12/20 0239 06/12/20 0354 06/12/20 0532  TROPONINIHS 103* 117* 105* 111*     Chemistry Recent Labs  Lab 06/12/20 0027 06/12/20 0354 06/13/20 0231  NA 136 142 138  K 3.4* 3.4* 3.6  CL 107 107 102  CO2 22 26 27   GLUCOSE 163* 87 98  BUN 21 20 16   CREATININE 0.89 0.88 0.83  CALCIUM 8.0* 8.5* 8.3*  GFRNONAA >60 >60 >60  ANIONGAP 7 9 9     Recent Labs  Lab 06/12/20 0027  PROT 5.8*  ALBUMIN 2.6*  AST 40  ALT 25  ALKPHOS 100  BILITOT  1.1   Hematology Recent Labs  Lab 06/12/20 0027 06/12/20 0354 06/13/20 0231  WBC 3.2* 3.6* 4.3  RBC 4.52 4.70 4.89  HGB 14.0 14.7 15.2  HCT 44.4 45.4 46.4  MCV 98.2 96.6 94.9  MCH 31.0 31.3 31.1  MCHC 31.5 32.4 32.8  RDW 15.8* 15.9* 15.8*  PLT 100* 103* 103*   BNP Recent Labs  Lab 06/12/20 0027  BNP 1,215.1*    DDimer No results for input(s): DDIMER in the last 168 hours.   Radiology/Studies:  DG Chest Port 1 View  Result Date: 06/12/2020 CLINICAL DATA:  Shortness of breath. EXAM: PORTABLE CHEST 1 VIEW COMPARISON:  None. FINDINGS: Decreased lung volumes are seen which is, in part, secondary to the degree of patient inspiration. Mild to moderate severity areas of atelectasis and/or infiltrate are seen within the bilateral lung bases, left greater than right. A thin walled parenchymal cyst is noted within the lateral aspect of the right lung base. There is no evidence of a pleural effusion or pneumothorax. There is mild to moderate severity enlargement of the cardiac silhouette. An artificial aortic  valve is seen with marked severity calcification of the aortic arch. The visualized skeletal structures are unremarkable. IMPRESSION: Stable cardiomegaly with mild to moderate severity bibasilar atelectasis and/or infiltrate. Electronically Signed   By: Virgina Norfolk M.D.   On: 06/12/2020 01:50   ECHOCARDIOGRAM COMPLETE  Result Date: 06/12/2020    ECHOCARDIOGRAM REPORT   Patient Name:   Tyler Williams Date of Exam: 06/12/2020 Medical Rec #:  [id number]     Height:       68.0 in Accession #:    [id number]    Weight:       185.0 lb Date of Birth:  02-06-1927      BSA:          1.977 m Patient Age:    32 years      BP:           147/75 mmHg Patient Gender: M             HR:           71 bpm. Exam Location:  Inpatient Procedure: 2D Echo, Cardiac Doppler and Color Doppler Indications:    T02.40 Acute diastolic (congestive) heart failure  History:        Patient has prior history of  Echocardiogram examinations, most                 recent 07/29/2017. 29 mm Edwards bioprosthetic valve in the                 aortic position.                 Aortic Valve: 29 mm Sapien prosthetic, stented (TAVR) valve is                 present in the aortic position.  Sonographer:    Merrie Roof RDCS Referring Phys: [id number] Guys  1. Left ventricular ejection fraction, by estimation, is 45 to 50%. The left ventricle has mildly decreased function. The left ventricle demonstrates regional wall motion abnormalities (see scoring diagram/findings for description). There is mild concentric left ventricular hypertrophy. Left ventricular diastolic parameters are consistent with Grade II diastolic dysfunction (pseudonormalization). Elevated left atrial pressure. There is moderate hypokinesis of the left ventricular, basal-mid inferior wall and inferolateral wall.  2. Right ventricular systolic function is mildly reduced. The right ventricular size is normal. There is mildly elevated pulmonary artery systolic pressure.  3. Left atrial size was severely dilated.  4. Right atrial size was moderately dilated.  5. The mitral valve is normal in structure. Mild to moderate mitral valve regurgitation.  6. The aortic valve has been repaired/replaced. Aortic valve regurgitation is trivial. No aortic stenosis is present. There is a 29 mm Sapien prosthetic (TAVR) valve present in the aortic position. Aortic valve mean gradient measures 6.0 mmHg. Aortic valve Vmax measures 1.82 m/s.  7. The inferior vena cava is dilated in size with <50% respiratory variability, suggesting right atrial pressure of 15 mmHg. Comparison(s): Prior images unable to be directly viewed, comparison made by report only. The left ventricular function is worsened. The left ventricular wall motion abnormality is unchanged. FINDINGS  Left Ventricle: Left ventricular ejection fraction, by estimation, is 45 to 50%. The left ventricle has mildly  decreased function. The left ventricle demonstrates regional wall motion abnormalities. Moderate hypokinesis of the left ventricular, basal-mid inferior wall and inferolateral wall. The left ventricular internal cavity size was normal in size. There is mild concentric left ventricular hypertrophy. Abnormal (  paradoxical) septal motion consistent with post-operative status. Left ventricular diastolic parameters are consistent with Grade II diastolic dysfunction (pseudonormalization). Elevated left atrial pressure. Right Ventricle: The right ventricular size is normal. No increase in right ventricular wall thickness. Right ventricular systolic function is mildly reduced. There is mildly elevated pulmonary artery systolic pressure. The tricuspid regurgitant velocity  is 2.56 m/s, and with an assumed right atrial pressure of 15 mmHg, the estimated right ventricular systolic pressure is 86.7 mmHg. Left Atrium: Left atrial size was severely dilated. Right Atrium: Right atrial size was moderately dilated. Pericardium: There is no evidence of pericardial effusion. Mitral Valve: The mitral valve is normal in structure. Mild to moderate mitral annular calcification. Mild to moderate mitral valve regurgitation, with centrally-directed jet. Tricuspid Valve: The tricuspid valve is normal in structure. Tricuspid valve regurgitation is mild. Aortic Valve: The aortic valve has been repaired/replaced. Aortic valve regurgitation is trivial. Aortic regurgitation PHT measures 422 msec. No aortic stenosis is present. Aortic valve mean gradient measures 6.0 mmHg. Aortic valve peak gradient measures  13.2 mmHg. Aortic valve area, by VTI measures 1.31 cm. There is a 29 mm Sapien prosthetic, stented (TAVR) valve present in the aortic position. Pulmonic Valve: The pulmonic valve was normal in structure. Pulmonic valve regurgitation is not visualized. Aorta: The aortic root and ascending aorta are structurally normal, with no evidence of  dilitation. Venous: The inferior vena cava is dilated in size with less than 50% respiratory variability, suggesting right atrial pressure of 15 mmHg. IAS/Shunts: No atrial level shunt detected by color flow Doppler.  LEFT VENTRICLE PLAX 2D LVIDd:         4.80 cm LVIDs:         3.80 cm LV PW:         1.40 cm LV IVS:        1.00 cm LVOT diam:     2.20 cm LV SV:         41 LV SV Index:   21 LVOT Area:     3.80 cm  RIGHT VENTRICLE            IVC RV Basal diam:  3.70 cm    IVC diam: 3.10 cm RV S prime:     6.85 cm/s TAPSE (M-mode): 1.0 cm LEFT ATRIUM            Index       RIGHT ATRIUM           Index LA diam:      5.70 cm  2.88 cm/m  RA Area:     29.30 cm LA Vol (A4C): 114.0 ml 57.66 ml/m RA Volume:   90.60 ml  45.82 ml/m  AORTIC VALVE AV Area (Vmax):    1.38 cm AV Area (Vmean):   1.39 cm AV Area (VTI):     1.31 cm AV Vmax:           182.00 cm/s AV Vmean:          115.000 cm/s AV VTI:            0.316 m AV Peak Grad:      13.2 mmHg AV Mean Grad:      6.0 mmHg LVOT Vmax:         66.13 cm/s LVOT Vmean:        41.933 cm/s LVOT VTI:          0.109 m LVOT/AV VTI ratio: 0.34 AI PHT:  422 msec  AORTA Ao Root diam: 3.20 cm Ao Asc diam:  3.30 cm TRICUSPID VALVE TR Peak grad:   26.2 mmHg TR Vmax:        256.00 cm/s  SHUNTS Systemic VTI:  0.11 m Systemic Diam: 2.20 cm Dani Gobble Croitoru MD Electronically signed by Sanda Klein MD Signature Date/Time: 06/12/2020/5:57:11 PM    Final      Assessment and Plan:   1. Acute on chronic diastolic CHF: patient has known history of diastolic CHF, now with evidence of systolic dysfunction with EF 45-50% on echo this admission. Side by side comparison was not available to compare to previous though on Dr. Theodosia Blender review, likely EF is in the 50-55% range. RWMA were noted to be unchanged. He has had progressive SOB and LE edema recently. BNP elevated to 1200. Suspect poor nutrition status is also contributing to his volume overload given albumin 2.6. He was started on IV  lasix 20mg  BID with UOP -1.5L in the past 24 hours and -1.6L this admission. Weight down to 172lbs today from 185lbs yesterday. Now with urinary retention - Await foley catheter placement - Continue IV lasix 20mg  BID - Continue strict I&Os and daily weights - Continue to monitor electrolytes and replete as needed to maintain K >4, Mg >2  2. Influenza A with superimposed bacterial PNA: likely contributing to his symptoms of cough and weakness. CXR showed moderate bibasilar atelectasis/infiltrate. He was started on tamiflu and CTX.  - Continue antibiotics and supportive care per primary team  3. CAD s/p POBA to RCA remotely: no recent anginal complaints. HsTrop 105>111, low flat trend not c/w ACS. EKG non-ischemic.  - Continue aspirin and statin - Continue BBlocker - Continue imdur  4. Permanent atrial fibrillation: rates well controlled this admission. INR has been supratherapeutic 7.7>7.9>6.3.  - Continue metoprolol for rate control - Continue coumadin per pharmacy with goal INR 2-3  5. Aortic stenosis s/p TAVR in 2018: valve appears stable on echo this admission - Continue routine monitoring.   6. HTN: BP elevated this admission.  - Continue lasix, imdur, metoprolol, and ramipril. - May need to consider restarting amlodipine  7. Urinary retention: patient with difficulty urinating today. Bladder scan with 500-600cc. RN attempted 2 foley placements but unsuccessful. Urology consulted - Await foley placement - Continue management per primary team and urology.   8. HLD:  - Continue atorvastatin     Risk Assessment/Risk Scores:   New York Heart Association (NYHA) Functional Class NYHA Class III  CHA2DS2-VASc Score = 5  This indicates a 7.2% annual risk of stroke. The patient's score is based upon: CHF History: Yes HTN History: Yes Diabetes History: No Stroke History: No Vascular Disease History: Yes Age Score: 2 Gender Score: 0       For questions or updates, please  contact [place] Please consult [email or web address] for contact info under    Signed, [name], PA-C  06/13/2020 4:20 PM

## 2020-06-14 DIAGNOSIS — I5041 Acute combined systolic (congestive) and diastolic (congestive) heart failure: Secondary | ICD-10-CM | POA: Diagnosis not present

## 2020-06-14 DIAGNOSIS — I482 Chronic atrial fibrillation, unspecified: Secondary | ICD-10-CM | POA: Diagnosis not present

## 2020-06-14 DIAGNOSIS — I10 Essential (primary) hypertension: Secondary | ICD-10-CM | POA: Diagnosis not present

## 2020-06-14 DIAGNOSIS — I2510 Atherosclerotic heart disease of native coronary artery without angina pectoris: Secondary | ICD-10-CM | POA: Diagnosis not present

## 2020-06-14 LAB — MAGNESIUM: Magnesium: 2.1 mg/dL (ref 1.7–2.4)

## 2020-06-14 LAB — BASIC METABOLIC PANEL
Anion gap: 10 (ref 5–15)
BUN: 15 mg/dL (ref 8–23)
CO2: 29 mmol/L (ref 22–32)
Calcium: 8.5 mg/dL — ABNORMAL LOW (ref 8.9–10.3)
Chloride: 99 mmol/L (ref 98–111)
Creatinine, Ser: 0.9 mg/dL (ref 0.61–1.24)
GFR, Estimated: >60 mL/min (ref >60)
Glucose, Bld: 108 mg/dL — ABNORMAL HIGH (ref 70–99)
Potassium: 4.6 mmol/L (ref 3.5–5.1)
Sodium: 138 mmol/L (ref 135–145)

## 2020-06-14 LAB — CBC
HCT: 45.2 % (ref 39.0–52.0)
Hemoglobin: 14.7 g/dL (ref 13.0–17.0)
MCH: 31 pg (ref 26.0–34.0)
MCHC: 32.5 g/dL (ref 30.0–36.0)
MCV: 95.4 fL (ref 80.0–100.0)
Platelets: 107 10*3/uL — ABNORMAL LOW (ref 150–400)
RBC: 4.74 MIL/uL (ref 4.22–5.81)
RDW: 15.7 % — ABNORMAL HIGH (ref 11.5–15.5)
WBC: 7.2 10*3/uL (ref 4.0–10.5)
nRBC: 0 % (ref 0.0–0.2)

## 2020-06-14 LAB — PROTIME-INR
INR: 3.8 — ABNORMAL HIGH (ref 0.8–1.2)
Prothrombin Time: 37.2 seconds — ABNORMAL HIGH (ref 11.4–15.2)

## 2020-06-14 MED ORDER — HYDRALAZINE HCL 10 MG PO TABS
10.0000 mg | ORAL_TABLET | Freq: Three times a day (TID) | ORAL | Status: DC
  Administered 2020-06-14 – 2020-06-21 (×20): 10 mg via ORAL
  Filled 2020-06-14 (×21): qty 1

## 2020-06-14 MED ORDER — IPRATROPIUM-ALBUTEROL 0.5-2.5 (3) MG/3ML IN SOLN: 3.0000 mL | Freq: Three times a day (TID) | RESPIRATORY_TRACT | Status: DC | PRN

## 2020-06-14 MED ORDER — WARFARIN SODIUM 1 MG PO TABS
1.0000 mg | ORAL_TABLET | Freq: Once | ORAL | Status: AC
  Administered 2020-06-14: 1 mg via ORAL
  Filled 2020-06-14: qty 1

## 2020-06-14 NOTE — Progress Notes (Signed)
Progress Note  Patient Name: Tyler Williams Date of Encounter: 06/14/2020  [facility] Cardiologist: Sherren Mocha, MD   Subjective   Denies any chest pain and SOB has improved.  He put out 2.1L urine yesterday and is neg 3.5L  Inpatient Medications    Scheduled Meds: . amLODipine  5 mg Oral Daily  . aspirin EC  81 mg Oral Daily  . atorvastatin  20 mg Oral Daily  . Chlorhexidine Gluconate Cloth  6 each Topical Daily  . fesoterodine  8 mg Oral QPM  . finasteride  5 mg Oral Daily  . furosemide  40 mg Intravenous BID  . guaiFENesin  1,200 mg Oral BID  . ipratropium-albuterol  3 mL Nebulization TID  . isosorbide mononitrate  120 mg Oral Daily  . metoprolol succinate  25 mg Oral Daily  . oseltamivir  30 mg Oral BID  . ramipril  10 mg Oral BID  . sodium chloride HYPERTONIC  4 mL Nebulization Daily  . tamsulosin  0.4 mg Oral Daily  . warfarin  1 mg Oral ONCE-1600  . Warfarin - Pharmacist Dosing Inpatient   Does not apply q1600   Continuous Infusions: . cefTRIAXone (ROCEPHIN)  IV 2 g (06/14/20 0453)   PRN Meds: HYDROmorphone (DILAUDID) injection, LORazepam, nitroGLYCERIN, polyethylene glycol   Vital Signs    Vitals:   06/13/20 1630 06/13/20 1917 06/14/20 0153 06/14/20 0440  BP: (!) 188/86 120/63  140/70  Pulse: 80 80  76  Resp: 17 16  16   Temp: 98.2 F (36.8 C) 98.1 F (36.7 C)  98.6 F (37 C)  TempSrc: Oral Oral  Oral  SpO2: 94% 92%  95%  Weight:   77.4 kg   Height:        Intake/Output Summary (Last 24 hours) at 06/14/2020 0845 Last data filed at 06/14/2020 0750 Gross per 24 hour  Intake 300 ml  Output 2150 ml  Net -1850 ml   Last 3 Weights 06/14/2020 06/13/2020 06/12/2020  Weight (lbs) 170 lb 10.2 oz 172 lb 185 lb  Weight (kg) 77.4 kg 78.019 kg 83.915 kg      Telemetry    Atrial fibrillation with CVR - Personally Reviewed  ECG    No new EKG to review - Personally Reviewed  Physical Exam   GEN: Well nourished, well developed in no acute  distress HEENT: Normal NECK: No JVD; No carotid bruits LYMPHATICS: No lymphadenopathy CARDIAC:irregularly irregular, no murmurs, rubs, gallops RESPIRATORY:  Clear to auscultation without rales, wheezing or rhonchi  ABDOMEN: Soft, non-tender, non-distended MUSCULOSKELETAL:  2+ pitting pedal edema; No deformity  SKIN: Warm and dry NEUROLOGIC:  Alert and oriented x 3 PSYCHIATRIC:  Normal affect    Labs    High Sensitivity Troponin:   Recent Labs  Lab 06/12/20 0027 06/12/20 0239 06/12/20 0354 06/12/20 0532  TROPONINIHS 103* 117* 105* 111*      Chemistry Recent Labs  Lab 06/12/20 0027 06/12/20 0354 06/13/20 0231 06/14/20 0420  NA 136 142 138 138  K 3.4* 3.4* 3.6 4.6  CL 107 107 102 99  CO2 22 26 27 29   GLUCOSE 163* 87 98 108*  BUN 21 20 16 15   CREATININE 0.89 0.88 0.83 0.90  CALCIUM 8.0* 8.5* 8.3* 8.5*  PROT 5.8*  --   --   --   ALBUMIN 2.6*  --   --   --   AST 40  --   --   --   ALT 25  --   --   --  ALKPHOS 100  --   --   --   BILITOT 1.1  --   --   --   GFRNONAA >60 >60 >60 >60  ANIONGAP 7 9 9 10      Hematology Recent Labs  Lab 06/12/20 0027 06/12/20 0354 06/13/20 0231  WBC 3.2* 3.6* 4.3  RBC 4.52 4.70 4.89  HGB 14.0 14.7 15.2  HCT 44.4 45.4 46.4  MCV 98.2 96.6 94.9  MCH 31.0 31.3 31.1  MCHC 31.5 32.4 32.8  RDW 15.8* 15.9* 15.8*  PLT 100* 103* 103*    BNP Recent Labs  Lab 06/12/20 0027  BNP 1,215.1*     DDimer No results for input(s): DDIMER in the last 168 hours.   CHA2DS2-VASc Score = 5  This indicates a 7.2% annual risk of stroke. The patient's score is based upon: CHF History: Yes HTN History: Yes Diabetes History: No Stroke History: No Vascular Disease History: Yes Age Score: 2 Gender Score: 0     Radiology    ECHOCARDIOGRAM COMPLETE  Result Date: 06/12/2020    ECHOCARDIOGRAM REPORT   Patient Name:   Tyler Williams Date of Exam: 06/12/2020 Medical Rec #:  [id number]     Height:       68.0 in Accession #:    [id number]     Weight:       185.0 lb Date of Birth:  08/27/1926      BSA:          1.977 m Patient Age:    87 years      BP:           147/75 mmHg Patient Gender: M             HR:           71 bpm. Exam Location:  Inpatient Procedure: 2D Echo, Cardiac Doppler and Color Doppler Indications:    W43.15 Acute diastolic (congestive) heart failure  History:        Patient has prior history of Echocardiogram examinations, most                 recent 07/29/2017. 29 mm Edwards bioprosthetic valve in the                 aortic position.                 Aortic Valve: 29 mm Sapien prosthetic, stented (TAVR) valve is                 present in the aortic position.  Sonographer:    Merrie Roof RDCS Referring Phys: [id number] [place]  1. Left ventricular ejection fraction, by estimation, is 45 to 50%. The left ventricle has mildly decreased function. The left ventricle demonstrates regional wall motion abnormalities (see scoring diagram/findings for description). There is mild concentric left ventricular hypertrophy. Left ventricular diastolic parameters are consistent with Grade II diastolic dysfunction (pseudonormalization). Elevated left atrial pressure. There is moderate hypokinesis of the left ventricular, basal-mid inferior wall and inferolateral wall.  2. Right ventricular systolic function is mildly reduced. The right ventricular size is normal. There is mildly elevated pulmonary artery systolic pressure.  3. Left atrial size was severely dilated.  4. Right atrial size was moderately dilated.  5. The mitral valve is normal in structure. Mild to moderate mitral valve regurgitation.  6. The aortic valve has been repaired/replaced. Aortic valve regurgitation is trivial. No aortic stenosis is present. There is a 29 mm Sapien  prosthetic (TAVR) valve present in the aortic position. Aortic valve mean gradient measures 6.0 mmHg. Aortic valve Vmax measures 1.82 m/s.  7. The inferior vena cava is dilated in size with <50%  respiratory variability, suggesting right atrial pressure of 15 mmHg. Comparison(s): Prior images unable to be directly viewed, comparison made by report only. The left ventricular function is worsened. The left ventricular wall motion abnormality is unchanged. FINDINGS  Left Ventricle: Left ventricular ejection fraction, by estimation, is 45 to 50%. The left ventricle has mildly decreased function. The left ventricle demonstrates regional wall motion abnormalities. Moderate hypokinesis of the left ventricular, basal-mid inferior wall and inferolateral wall. The left ventricular internal cavity size was normal in size. There is mild concentric left ventricular hypertrophy. Abnormal (paradoxical) septal motion consistent with post-operative status. Left ventricular diastolic parameters are consistent with Grade II diastolic dysfunction (pseudonormalization). Elevated left atrial pressure. Right Ventricle: The right ventricular size is normal. No increase in right ventricular wall thickness. Right ventricular systolic function is mildly reduced. There is mildly elevated pulmonary artery systolic pressure. The tricuspid regurgitant velocity  is 2.56 m/s, and with an assumed right atrial pressure of 15 mmHg, the estimated right ventricular systolic pressure is [id number] mmHg. Left Atrium: Left atrial size was severely dilated. Right Atrium: Right atrial size was moderately dilated. Pericardium: There is no evidence of pericardial effusion. Mitral Valve: The mitral valve is normal in structure. Mild to moderate mitral annular calcification. Mild to moderate mitral valve regurgitation, with centrally-directed jet. Tricuspid Valve: The tricuspid valve is normal in structure. Tricuspid valve regurgitation is mild. Aortic Valve: The aortic valve has been repaired/replaced. Aortic valve regurgitation is trivial. Aortic regurgitation PHT measures 422 msec. No aortic stenosis is present. Aortic valve mean gradient measures 6.0 mmHg.  Aortic valve peak gradient measures  13.2 mmHg. Aortic valve area, by VTI measures 1.31 cm. There is a 29 mm Sapien prosthetic, stented (TAVR) valve present in the aortic position. Pulmonic Valve: The pulmonic valve was normal in structure. Pulmonic valve regurgitation is not visualized. Aorta: The aortic root and ascending aorta are structurally normal, with no evidence of dilitation. Venous: The inferior vena cava is dilated in size with less than 50% respiratory variability, suggesting right atrial pressure of 15 mmHg. IAS/Shunts: No atrial level shunt detected by color flow Doppler.  LEFT VENTRICLE PLAX 2D LVIDd:         4.80 cm LVIDs:         3.80 cm LV PW:         1.40 cm LV IVS:        1.00 cm LVOT diam:     2.20 cm LV SV:         41 LV SV Index:   21 LVOT Area:     3.80 cm  RIGHT VENTRICLE            IVC RV Basal diam:  3.70 cm    IVC diam: 3.10 cm RV S prime:     6.85 cm/s TAPSE (M-mode): 1.0 cm LEFT ATRIUM            Index       RIGHT ATRIUM           Index LA diam:      5.70 cm  2.88 cm/m  RA Area:     29.30 cm LA Vol (A4C): 114.0 ml 57.66 ml/m RA Volume:   90.60 ml  45.82 ml/m  AORTIC VALVE AV Area (Vmax):    1.38  cm AV Area (Vmean):   1.39 cm AV Area (VTI):     1.31 cm AV Vmax:           182.00 cm/s AV Vmean:          115.000 cm/s AV VTI:            0.316 m AV Peak Grad:      13.2 mmHg AV Mean Grad:      6.0 mmHg LVOT Vmax:         66.13 cm/s LVOT Vmean:        41.933 cm/s LVOT VTI:          0.109 m LVOT/AV VTI ratio: 0.34 AI PHT:            422 msec  AORTA Ao Root diam: 3.20 cm Ao Asc diam:  3.30 cm TRICUSPID VALVE TR Peak grad:   26.2 mmHg TR Vmax:        256.00 cm/s  SHUNTS Systemic VTI:  0.11 m Systemic Diam: 2.20 cm Sanda Klein MD Electronically signed by Sanda Klein MD Signature Date/Time: 06/12/2020/5:57:11 PM    Final     Cardiac Studies   2D echo 05/2020 IMPRESSIONS   1. Left ventricular ejection fraction, by estimation, is 45 to 50%. The  left ventricle has mildly  decreased function. The left ventricle  demonstrates regional wall motion abnormalities (see scoring  diagram/findings for description). There is mild  concentric left ventricular hypertrophy. Left ventricular diastolic  parameters are consistent with Grade II diastolic dysfunction  (pseudonormalization). Elevated left atrial pressure. There is moderate  hypokinesis of the left ventricular, basal-mid  inferior wall and inferolateral wall.  2. Right ventricular systolic function is mildly reduced. The right  ventricular size is normal. There is mildly elevated pulmonary artery  systolic pressure.  3. Left atrial size was severely dilated.  4. Right atrial size was moderately dilated.  5. The mitral valve is normal in structure. Mild to moderate mitral valve  regurgitation.  6. The aortic valve has been repaired/replaced. Aortic valve  regurgitation is trivial. No aortic stenosis is present. There is a 29 mm  Sapien prosthetic (TAVR) valve present in the aortic position. Aortic  valve mean gradient measures 6.0 mmHg. Aortic  valve Vmax measures 1.82 m/s.  7. The inferior vena cava is dilated in size with <50% respiratory  variability, suggesting right atrial pressure of 15 mmHg.   Comparison(s): Prior images unable to be directly viewed, comparison made  by report only. The left ventricular function is worsened. The left  ventricular wall motion abnormality is unchanged.   Patient Profile     87 y.o. male with a PMH of CAD s/p POBA to RCA remotely, permanent atrial fibrillation on coumadin, chronic diastolic CHF, aortic stenosis s/p TAVR in 2018, AAA s/p repair, chronic venous insufficiency, HTN, HLD, who is being seen for the evaluation of abnormal echocardiogram at the request of Dr. Doristine Bosworth  Assessment & Plan    1. Acute on chronic diastolic CHF: patient has known history of diastolic CHF, now with evidence of systolic dysfunction with EF 45-50% on echo this admission. Side by  side comparison was not available to compare to previous though on Dr. Theodosia Blender review, likely EF is in the 50-55% range. RWMA were noted to be unchanged. He has had progressive SOB and LE edema recently. BNP elevated to 1200. Suspect poor nutrition status is also contributing to his volume overload given albumin 2.6.  - He was started on  IV lasix 20mg  BID  - he put out 2.15L yesterday and 3.5L net neg - renal function stable with SCr 0.90 and K+ 4.6 today - still has significant pedal edema - Continue IV lasix 20mg  BID - Continue strict I&Os and daily weights - Continue to monitor electrolytes and replete as needed to maintain K >4, Mg >2  2. Influenza A with superimposed bacterial PNA: likely contributing to his symptoms of cough and weakness. CXR showed moderate bibasilar atelectasis/infiltrate. He was started on tamiflu and CTX.  - Continue antibiotics and supportive care per primary team  3. CAD s/p POBA to RCA remotely: no recent anginal complaints. HsTrop 105>111, low flat trend not c/w ACS. EKG non-ischemic.  - Continue aspirin and statin - Continue BBlocker - Continue imdur  4. Permanent atrial fibrillation: rates well controlled this admission. INR has been supratherapeutic 7.7>7.9>6.3>3.8.  - HR controlled at 76bpm - Continue metoprolol for rate control - Continue coumadin per pharmacy with goal INR 2-3  5. Aortic stenosis s/p TAVR in 2018: valve appears stable on echo this admission - Continue routine monitoring.   6. HTN:  - BP controlled at 140/17mmHg - Continue lasix, imdur, metoprolol and ramipril. - stop amlodipine due to LE edema and add Hydralazine 10mg  TID  7. Urinary retention: patient with difficulty urinating today. Bladder scan with 500-600cc. RN attempted 2 foley placements but unsuccessful. Urology consulted - catheter placed and now with gross hematuria due to traumatic placement on anticoagulation - Continue management per primary team and urology.    8. HLD:  - Continue atorvastatin    I have spent a total of 35 minutes with patient reviewing 2D echo , telemetry, EKGs, labs and examining patient as well as establishing an assessment and plan that was discussed with the patient.  > 50% of time was spent in direct patient care.    For questions or updates, please contact [place] Please consult [email or web address] for contact info under        Signed, Fransico Him, MD  06/14/2020, 8:45 AM

## 2020-06-14 NOTE — Progress Notes (Signed)
Physical Therapy Treatment Patient Details Name: Tyler Williams MRN: [id number] DOB: [birth year] Today's Date: 06/14/2020    History of Present Illness The pt is a 87 yo male presenting 4/29 from [place] with BLE swelling and SOB that has been progressively worsening over last few days. Pt found to have elevated troponin, INR of 7.7, and the flu with PNA upon work up, admitted for management of troponin. PMH includes: afib, AAA s/p repair, arthritis, CAD, HTN, PVD, s/p TAVR (2018), and multiple joint replacements.    PT Comments    Patient progressing well towards PT goals. [place]. Improved ambulation distance with min guard assist and use of RW for support. HR ranging from 70-94 bpm and Sp02 remained >95% on RA. Easily redirected and following commands well. Recommend continued mobility with RN and mobility tech to maintain strength/endurance and help with overall confusion. Will follow.   Follow Up Recommendations  Home health PT;Supervision for mobility/OOB     Equipment Recommendations  None recommended by PT    Recommendations for Other Services       Precautions / Restrictions Precautions Precautions: Fall Precaution Comments: fell on 4/24 Restrictions Weight Bearing Restrictions: No    Mobility  Bed Mobility               General bed mobility comments: Sitting EOB upon PT arrival.    Transfers Overall transfer level: Needs assistance Equipment used: Rolling walker (2 wheeled) Transfers: Sit to/from Stand Sit to Stand: Min guard         General transfer comment: Min guard for safety. Slow to rise from EOB x1.  Ambulation/Gait Ambulation/Gait assistance: Min guard Gait Distance (Feet): 325 Feet Assistive device: Rolling walker (2 wheeled) Gait Pattern/deviations: Step-through pattern;Decreased stride length;Trunk flexed Gait velocity: 1.0 ft/sec Gait velocity  interpretation: <1.31 ft/sec, indicative of household ambulator General Gait Details: Slow mostly steady gait with RW for support. HR ranging from 70-94 bpm. Sp02 remained >95% on RA.   Stairs             Wheelchair Mobility    Modified Rankin (Stroke Patients Only)       Balance Overall balance assessment: Needs assistance Sitting-balance support: No upper extremity supported;Feet supported Sitting balance-Leahy Scale: Good     Standing balance support: During functional activity Standing balance-Leahy Scale: Poor Standing balance comment: Requires UE support in standing.                            Cognition Arousal/Alertness: Awake/alert Behavior During Therapy: WFL for tasks assessed/performed Overall Cognitive Status: Impaired/Different from baseline Area of Impairment: Orientation;Memory;Safety/judgement                 Orientation Level: Disoriented to;Place;Situation   Memory: Decreased short-term memory   Safety/Judgement: Decreased awareness of safety;Decreased awareness of deficits     General Comments: [place] and looking for his hat to go golfing. Knows it is May 2022. Follows commands well and easily redirected but not able to recall info related at beginning of session about orientation. Decreased insight into safety.      Exercises      General Comments General comments (skin integrity, edema, etc.): HR ranging from 70-94 bpm. Sp02 remained >95% on RA.      Pertinent Vitals/Pain Pain Assessment: No/denies pain  Home Living                      Prior Function            PT Goals (current goals can now be found in the care plan section) Progress towards PT goals: Progressing toward goals    Frequency    Min 3X/week      PT Plan Current plan remains appropriate    Co-evaluation              AM-PAC PT "6 Clicks" Mobility   Outcome Measure  Help  needed turning from your back to your side while in a flat bed without using bedrails?: A Little Help needed moving from lying on your back to sitting on the side of a flat bed without using bedrails?: A Little Help needed moving to and from a bed to a chair (including a wheelchair)?: A Little Help needed standing up from a chair using your arms (e.g., wheelchair or bedside chair)?: A Little Help needed to walk in hospital room?: A Little Help needed climbing 3-5 steps with a railing? : A Little 6 Click Score: 18    End of Session Equipment Utilized During Treatment: Gait belt Activity Tolerance: Patient tolerated treatment well Patient left: in bed;with call bell/phone within reach (sitting EOB) Nurse Communication: Mobility status PT Visit Diagnosis: Other abnormalities of gait and mobility (R26.89);Muscle weakness (generalized) (M62.81);History of falling (Z91.81)     Time: 0300-9233 PT Time Calculation (min) (ACUTE ONLY): 18 min  Charges:  $Gait Training: 8-22 mins                     Marisa Severin, PT, DPT Acute Rehabilitation Services Pager [phone] Office [place] 06/14/2020, 9:42 AM

## 2020-06-14 NOTE — Progress Notes (Signed)
ANTICOAGULATION CONSULT NOTE - Follow Up Consult  Pharmacy Consult for Coumadin Indication: atrial fibrillation  Allergies  Allergen Reactions  . No Known Allergies Other (See Comments)    NKA    Patient Measurements: Height: 5\' 8"  (172.7 cm) Weight: 77.4 kg (170 lb 10.2 oz) IBW/kg (Calculated) : 68.4  Vital Signs: Temp: 98.6 F (37 C) (05/02 0440) Temp Source: Oral (05/02 0440) BP: 140/70 (05/02 0440) Pulse Rate: 76 (05/02 0440)  Labs: Recent Labs    06/12/20 0027 06/12/20 0239 06/12/20 0354 06/12/20 0451 06/12/20 0532 06/13/20 0231 06/14/20 0420  HGB 14.0  --  14.7  --   --  15.2  --   HCT 44.4  --  45.4  --   --  46.4  --   PLT 100*  --  103*  --   --  103*  --   LABPROT 65.2*  --   --  66.5*  --  55.2* 37.2*  INR 7.7*  --   --  7.9*  --  6.3* 3.8*  CREATININE 0.89  --  0.88  --   --  0.83 0.90  TROPONINIHS 103* 117* 105*  --  111*  --   --     Estimated Creatinine Clearance: 50.7 mL/min (by C-G formula based on SCr of 0.9 mg/dL).  Assessment:  Anticoag: Warfarin PTA for afib. Plt 103 (usually ~120). INR down to 3.8. - Home dose: 5mg  daily - last dose 4/29, Admit INR 7.7  Goal of Therapy:  INR 2-3 Monitor platelets by anticoagulation protocol: Yes   Plan:  Coumadin 1mg  po x 1 tonight. Daily INR   Fable Huisman S. Alford Highland, PharmD, BCPS Clinical Staff Pharmacist [email or web address] Alford Highland, [place] 06/14/2020,7:42 AM

## 2020-06-14 NOTE — Discharge Instructions (Signed)

## 2020-06-14 NOTE — Progress Notes (Signed)
PROGRESS NOTE    Tyler Williams  MGQ:[id number] DOB: March 09, 1926 DOA: 06/11/2020 PCP: Burnard Bunting, MD   Brief Narrative:  HPI on 06/12/2020 by Dr. [name] Tyler Williams is a 87 y.o. male with medical history significant for permanent A. fib on Coumadin, coronary artery disease, peripheral artery disease, AAA s/p repair, aortic stenosis status post TAVR in 2671, chronic diastolic CHF, chronic venous insufficiency who presented to [facility] ED due to worsening semiproductive cough of 4 to 5 days duration.  Associated with worsening bilateral lower extremity edema and generalized weakness.  Denies fever.  No chest pain.  He presented to the ED for further evaluation.  History is mainly obtained from EDP, his daughter at bedside and from review of medical records.  Patient has his eyes closed during interview and is minimally interactive.  Upon presentation to the ED work-up revealed acute on chronic diastolic CHF with BNP greater than 1200, chest x-ray with increase in pulmonary vascularity as well as bibasilar infiltrates.  Influenza A positive, COVID-19 negative on 06/12/2020.  INR supratherapeutic 7.7.  Started on IV diuretics by EDP for acute CHF.  TRH, hospitalist team, was asked to admit.  Interim history Presented with generalized weakness, cough and lower extremity edema.  Patient admitted with CHF exacerbation along with influenza A viral pneumonia.  Currently on IV Lasix as well as Tamiflu and ceftriaxone.  Cardiology consulted and appreciated.  Hospitalization, gated by urinary retention, and traumatic Foley placement.  Patient with frank hematuria noted today.  Urology consulted and following. Assessment & Plan   Acute on chronic combined systolic and diastolic heart failure -Likely secondary to noncompliance with Lasix -Patient presented with signs of fluid overload as well as lower extremity edema -BNP 1215.1 -Echocardiogram showed an EF of 45 to 50%, mild LVH.  Grade 2  diastolic dysfunction. -Cardiology consulted and appreciated -Continue IV Lasix 20 mg IV twice daily -Monitor intake and output, daily weights  Influenza A viral pneumonia with superimposed bacterial infection -Chest x-ray showed mild to moderate severity bibasilar atelectasis and/or infiltrate -Placed on Tamiflu 30 mg twice daily for 5 days -Continue ceftriaxone -Procalcitonin <0.10 -Continue nebulizer treatments as needed for wheezing or shortness of breath -Continue Mucinex, flutter valve, incentive spirometry  Elevated troponin/history of coronary disease -Suspect troponin secondary to demand ischemia in the setting of viral and bacterial illness along with CHF exacerbation -Echocardiogram as above -Cardiology consulted and appreciate -Currently denies chest pain  Acute urinary retention with frank hematuria with UTI -Urine analysis showed many bacteria, > 50 WBC, moderate leukocytes, positive nitrites, large hemoglobin -Urine culture was not obtained, and may not show anything given that patient is already been on antibiotics (ceftriaxone).  However will obtain urine culture today -Pending CBC -Urology consulted and appreciated, status post Foley catheter placement.  Recommendations were for Foley catheter to remain in place for 7 to 10 days.  Continue Flomax and finasteride.  Hematuria expected in the setting of Foley trauma and ongoing blood thinning medications.  Permanent atrial fibrillation on Coumadin with supratherapeutic INR -Coumadin held  -INR is trending downward, currently 3.8 -Continue Toprol for rate control -Monitor CBC closely  Essential hypertension -BP stable, continue ramipril, metoprolol, Imdur  Generalized weakness -Likely secondary to the above -PT consulted recommending home health -OT evaluated patient, no further needs  Hypokalemia/hypomagnesemia -Resolved with replacement -Continue to monitor and replace as needed  Thrombocytopenia -Pending  CBC today   DVT Prophylaxis SCDs  Code Status: Full  Family Communication: None at bedside  Disposition Plan:  Status is: Inpatient  Remains inpatient appropriate because:IV treatments appropriate due to intensity of illness or inability to take PO and Inpatient level of care appropriate due to severity of illness   Dispo: The patient is from: ALF              Anticipated d/c is to: ALF              Patient currently is not medically stable to d/c.   Difficult to place patient No  Consultants Cardiology Urology  Procedures  Echocardiogram Foley catheter placement by urology  Antibiotics   Anti-infectives (From admission, onward)   Start     Dose/Rate Route Frequency Ordered Stop   06/14/20 0400  cefTRIAXone (ROCEPHIN) 2 g in sodium chloride 0.9 % 100 mL IVPB        2 g 200 mL/hr over 30 Minutes Intravenous Every 24 hours 06/13/20 0739 06/17/20 0359   06/12/20 0400  cefTRIAXone (ROCEPHIN) 1 g in sodium chloride 0.9 % 100 mL IVPB  Status:  Discontinued        1 g 200 mL/hr over 30 Minutes Intravenous Every 24 hours 06/12/20 0340 06/13/20 0739   06/12/20 0400  oseltamivir (TAMIFLU) capsule 30 mg        30 mg Oral 2 times daily 06/12/20 0345 06/17/20 9983      Subjective:   Tyler Williams seen and examined today.  Patient feels his breathing has improved, currently denies any chest pain.  Denies current abdominal pain, nausea or vomiting, diarrhea constipation, dizziness or headache.  Continues to have cough occasionally.    Objective:   Vitals:   06/13/20 1917 06/14/20 0153 06/14/20 0440 06/14/20 0910  BP: 120/63  140/70   Pulse: 80  76   Resp: 16  16   Temp: 98.1 F (36.7 C)  98.6 F (37 C)   TempSrc: Oral  Oral   SpO2: 92%  95% 96%  Weight:  77.4 kg    Height:        Intake/Output Summary (Last 24 hours) at 06/14/2020 0948 Last data filed at 06/14/2020 0750 Gross per 24 hour  Intake 60 ml  Output 2150 ml  Net -2090 ml   Filed Weights   06/12/20 0013  06/13/20 0307 06/14/20 0153  Weight: 83.9 kg 78 kg 77.4 kg    Exam  General: Well developed, elderly, chronically ill-appearing, NAD  HEENT: NCAT, mucous membranes moist.   Neck: Supple  Cardiovascular: S1 S2 auscultated, irregular, no murmur  Respiratory: Diminished breath sounds  Abdomen: Soft, nontender, nondistended, + bowel sounds  GU: Foley catheter in place, noted to have hematuria and bag  Extremities: warm dry without cyanosis clubbing.  2+ LE edema bilaterally  Neuro: AAOx3, nonfocal  Psych: pleasant, appropriate mood and affect   Data Reviewed: I have personally reviewed following labs and imaging studies  CBC: Recent Labs  Lab 06/12/20 0027 06/12/20 0354 06/13/20 0231  WBC 3.2* 3.6* 4.3  NEUTROABS 2.0  --   --   HGB 14.0 14.7 15.2  HCT 44.4 45.4 46.4  MCV 98.2 96.6 94.9  PLT 100* 103* 382*   Basic Metabolic Panel: Recent Labs  Lab 06/12/20 0027 06/12/20 0354 06/13/20 0231 06/14/20 0420  NA 136 142 138 138  K 3.4* 3.4* 3.6 4.6  CL 107 107 102 99  CO2 22 26 27 29   GLUCOSE 163* 87 98 108*  BUN 21 20 16 15   CREATININE 0.89 0.88 0.83 0.90  CALCIUM 8.0*  8.5* 8.3* 8.5*  MG  --  2.0 1.8 2.1  PHOS  --  3.0 2.6  --    GFR: Estimated Creatinine Clearance: 50.7 mL/min (by C-G formula based on SCr of 0.9 mg/dL). Liver Function Tests: Recent Labs  Lab 06/12/20 0027  AST 40  ALT 25  ALKPHOS 100  BILITOT 1.1  PROT 5.8*  ALBUMIN 2.6*   No results for input(s): LIPASE, AMYLASE in the last 168 hours. No results for input(s): AMMONIA in the last 168 hours. Coagulation Profile: Recent Labs  Lab 06/12/20 0027 06/12/20 0451 06/13/20 0231 06/14/20 0420  INR 7.7* 7.9* 6.3* 3.8*   Cardiac Enzymes: No results for input(s): CKTOTAL, CKMB, CKMBINDEX, TROPONINI in the last 168 hours. BNP (last 3 results) No results for input(s): PROBNP in the last 8760 hours. HbA1C: No results for input(s): HGBA1C in the last 72 hours. CBG: No results for  input(s): GLUCAP in the last 168 hours. Lipid Profile: No results for input(s): CHOL, HDL, LDLCALC, TRIG, CHOLHDL, LDLDIRECT in the last 72 hours. Thyroid Function Tests: No results for input(s): TSH, T4TOTAL, FREET4, T3FREE, THYROIDAB in the last 72 hours. Anemia Panel: No results for input(s): VITAMINB12, FOLATE, FERRITIN, TIBC, IRON, RETICCTPCT in the last 72 hours. Urine analysis:    Component Value Date/Time   COLORURINE RED (A) 06/13/2020 1335   APPEARANCEUR CLOUDY (A) 06/13/2020 1335   LABSPEC 1.010 06/13/2020 1335   PHURINE 6.5 06/13/2020 1335   GLUCOSEU NEGATIVE 06/13/2020 1335   HGBUR LARGE (A) 06/13/2020 1335   BILIRUBINUR NEGATIVE 06/13/2020 1335   KETONESUR 15 (A) 06/13/2020 1335   PROTEINUR >300 (A) 06/13/2020 1335   UROBILINOGEN 0.2 09/27/2013 2341   NITRITE POSITIVE (A) 06/13/2020 1335   LEUKOCYTESUR MODERATE (A) 06/13/2020 1335   Sepsis Labs: @LABRCNTIP (procalcitonin:4,lacticidven:4)  ) Recent Results (from the past 240 hour(s))  Resp Panel by RT-PCR (Flu A&B, Covid) Nasopharyngeal Swab     Status: Abnormal   Collection Time: 06/12/20 12:27 AM   Specimen: Nasopharyngeal Swab; Nasopharyngeal(NP) swabs in vial transport medium  Result Value Ref Range Status   SARS Coronavirus 2 by RT PCR NEGATIVE NEGATIVE Final    Comment: (NOTE) SARS-CoV-2 target nucleic acids are NOT DETECTED.  The SARS-CoV-2 RNA is generally detectable in upper respiratory specimens during the acute phase of infection. The lowest concentration of SARS-CoV-2 viral copies this assay can detect is 138 copies/mL. A negative result does not preclude SARS-Cov-2 infection and should not be used as the sole basis for treatment or other patient management decisions. A negative result may occur with  improper specimen collection/handling, submission of specimen other than nasopharyngeal swab, presence of viral mutation(s) within the areas targeted by this assay, and inadequate number of  viral copies(<138 copies/mL). A negative result must be combined with clinical observations, patient history, and epidemiological information. The expected result is Negative.  Fact Sheet for Patients:  EntrepreneurPulse.com.au  Fact Sheet for Healthcare Providers:  IncredibleEmployment.be  This test is no t yet approved or cleared by the [place] FDA and  has been authorized for detection and/or diagnosis of SARS-CoV-2 by FDA under an Emergency Use Authorization (EUA). This EUA will remain  in effect (meaning this test can be used) for the duration of the COVID-19 declaration under Section 564(b)(1) of the Act, 21 U.S.C.section 360bbb-3(b)(1), unless the authorization is terminated  or revoked sooner.       Influenza A by PCR POSITIVE (A) NEGATIVE Final   Influenza B by PCR NEGATIVE NEGATIVE Final  Comment: (NOTE) The Xpert Xpress SARS-CoV-2/FLU/RSV plus assay is intended as an aid in the diagnosis of influenza from Nasopharyngeal swab specimens and should not be used as a sole basis for treatment. Nasal washings and aspirates are unacceptable for Xpert Xpress SARS-CoV-2/FLU/RSV testing.  Fact Sheet for Patients: EntrepreneurPulse.com.au  Fact Sheet for Healthcare Providers: IncredibleEmployment.be  This test is not yet approved or cleared by the [place] FDA and has been authorized for detection and/or diagnosis of SARS-CoV-2 by FDA under an Emergency Use Authorization (EUA). This EUA will remain in effect (meaning this test can be used) for the duration of the COVID-19 declaration under Section 564(b)(1) of the Act, 21 U.S.C. section 360bbb-3(b)(1), unless the authorization is terminated or revoked.  Performed at [facility], [place] [address], [place], [place] [address]       Radiology Studies: ECHOCARDIOGRAM COMPLETE  Result Date: 06/12/2020    ECHOCARDIOGRAM REPORT    Patient Name:   Tyler Williams Date of Exam: 06/12/2020 Medical Rec #:  [id number]     Height:       68.0 in Accession #:    [id number]    Weight:       185.0 lb Date of Birth:  Jun 03, 1926      BSA:          1.977 m Patient Age:    40 years      BP:           147/75 mmHg Patient Gender: M             HR:           71 bpm. Exam Location:  Inpatient Procedure: 2D Echo, Cardiac Doppler and Color Doppler Indications:    [id number] Acute diastolic (congestive) heart failure  History:        Patient has prior history of Echocardiogram examinations, most                 recent 07/29/2017. 29 mm Edwards bioprosthetic valve in the                 aortic position.                 Aortic Valve: 29 mm Sapien prosthetic, stented (TAVR) valve is                 present in the aortic position.  Sonographer:    Merrie Roof RDCS Referring Phys: [id number] [place]  1. Left ventricular ejection fraction, by estimation, is 45 to 50%. The left ventricle has mildly decreased function. The left ventricle demonstrates regional wall motion abnormalities (see scoring diagram/findings for description). There is mild concentric left ventricular hypertrophy. Left ventricular diastolic parameters are consistent with Grade II diastolic dysfunction (pseudonormalization). Elevated left atrial pressure. There is moderate hypokinesis of the left ventricular, basal-mid inferior wall and inferolateral wall.  2. Right ventricular systolic function is mildly reduced. The right ventricular size is normal. There is mildly elevated pulmonary artery systolic pressure.  3. Left atrial size was severely dilated.  4. Right atrial size was moderately dilated.  5. The mitral valve is normal in structure. Mild to moderate mitral valve regurgitation.  6. The aortic valve has been repaired/replaced. Aortic valve regurgitation is trivial. No aortic stenosis is present. There is a 29 mm Sapien prosthetic (TAVR) valve present in the aortic position. Aortic valve  mean gradient measures 6.0 mmHg. Aortic valve Vmax measures 1.82 m/s.  7. The inferior vena  cava is dilated in size with <50% respiratory variability, suggesting right atrial pressure of 15 mmHg. Comparison(s): Prior images unable to be directly viewed, comparison made by report only. The left ventricular function is worsened. The left ventricular wall motion abnormality is unchanged. FINDINGS  Left Ventricle: Left ventricular ejection fraction, by estimation, is 45 to 50%. The left ventricle has mildly decreased function. The left ventricle demonstrates regional wall motion abnormalities. Moderate hypokinesis of the left ventricular, basal-mid inferior wall and inferolateral wall. The left ventricular internal cavity size was normal in size. There is mild concentric left ventricular hypertrophy. Abnormal (paradoxical) septal motion consistent with post-operative status. Left ventricular diastolic parameters are consistent with Grade II diastolic dysfunction (pseudonormalization). Elevated left atrial pressure. Right Ventricle: The right ventricular size is normal. No increase in right ventricular wall thickness. Right ventricular systolic function is mildly reduced. There is mildly elevated pulmonary artery systolic pressure. The tricuspid regurgitant velocity  is 2.56 m/s, and with an assumed right atrial pressure of 15 mmHg, the estimated right ventricular systolic pressure is 93.7 mmHg. Left Atrium: Left atrial size was severely dilated. Right Atrium: Right atrial size was moderately dilated. Pericardium: There is no evidence of pericardial effusion. Mitral Valve: The mitral valve is normal in structure. Mild to moderate mitral annular calcification. Mild to moderate mitral valve regurgitation, with centrally-directed jet. Tricuspid Valve: The tricuspid valve is normal in structure. Tricuspid valve regurgitation is mild. Aortic Valve: The aortic valve has been repaired/replaced. Aortic valve regurgitation is  trivial. Aortic regurgitation PHT measures 422 msec. No aortic stenosis is present. Aortic valve mean gradient measures 6.0 mmHg. Aortic valve peak gradient measures  13.2 mmHg. Aortic valve area, by VTI measures 1.31 cm. There is a 29 mm Sapien prosthetic, stented (TAVR) valve present in the aortic position. Pulmonic Valve: The pulmonic valve was normal in structure. Pulmonic valve regurgitation is not visualized. Aorta: The aortic root and ascending aorta are structurally normal, with no evidence of dilitation. Venous: The inferior vena cava is dilated in size with less than 50% respiratory variability, suggesting right atrial pressure of 15 mmHg. IAS/Shunts: No atrial level shunt detected by color flow Doppler.  LEFT VENTRICLE PLAX 2D LVIDd:         4.80 cm LVIDs:         3.80 cm LV PW:         1.40 cm LV IVS:        1.00 cm LVOT diam:     2.20 cm LV SV:         41 LV SV Index:   21 LVOT Area:     3.80 cm  RIGHT VENTRICLE            IVC RV Basal diam:  3.70 cm    IVC diam: 3.10 cm RV S prime:     6.85 cm/s TAPSE (M-mode): 1.0 cm LEFT ATRIUM            Index       RIGHT ATRIUM           Index LA diam:      5.70 cm  2.88 cm/m  RA Area:     29.30 cm LA Vol (A4C): 114.0 ml 57.66 ml/m RA Volume:   90.60 ml  45.82 ml/m  AORTIC VALVE AV Area (Vmax):    1.38 cm AV Area (Vmean):   1.39 cm AV Area (VTI):     1.31 cm AV Vmax:  182.00 cm/s AV Vmean:          115.000 cm/s AV VTI:            0.316 m AV Peak Grad:      13.2 mmHg AV Mean Grad:      6.0 mmHg LVOT Vmax:         66.13 cm/s LVOT Vmean:        41.933 cm/s LVOT VTI:          0.109 m LVOT/AV VTI ratio: 0.34 AI PHT:            422 msec  AORTA Ao Root diam: 3.20 cm Ao Asc diam:  3.30 cm TRICUSPID VALVE TR Peak grad:   26.2 mmHg TR Vmax:        256.00 cm/s  SHUNTS Systemic VTI:  0.11 m Systemic Diam: 2.20 cm Dani Gobble Croitoru MD Electronically signed by Sanda Klein MD Signature Date/Time: 06/12/2020/5:57:11 PM    Final      Scheduled Meds: . aspirin  EC  81 mg Oral Daily  . atorvastatin  20 mg Oral Daily  . Chlorhexidine Gluconate Cloth  6 each Topical Daily  . fesoterodine  8 mg Oral QPM  . finasteride  5 mg Oral Daily  . furosemide  40 mg Intravenous BID  . guaiFENesin  1,200 mg Oral BID  . hydrALAZINE  10 mg Oral Q8H  . ipratropium-albuterol  3 mL Nebulization TID  . isosorbide mononitrate  120 mg Oral Daily  . metoprolol succinate  25 mg Oral Daily  . oseltamivir  30 mg Oral BID  . ramipril  10 mg Oral BID  . sodium chloride HYPERTONIC  4 mL Nebulization Daily  . tamsulosin  0.4 mg Oral Daily  . warfarin  1 mg Oral ONCE-1600  . Warfarin - Pharmacist Dosing Inpatient   Does not apply q1600   Continuous Infusions: . cefTRIAXone (ROCEPHIN)  IV 2 g (06/14/20 0453)     LOS: 2 days   Time Spent in minutes   45 minutes  Kyia Rhude D.O. on 06/14/2020 at 9:48 AM  Between 7am to 7pm - Please see pager noted on [email or web address]  After 7pm go to [email or web address]  And look for the night coverage person covering for me after hours  [facility] Office  [phone]

## 2020-06-15 ENCOUNTER — Inpatient Hospital Stay (HOSPITAL_COMMUNITY): Payer: Medicare Other

## 2020-06-15 ENCOUNTER — Ambulatory Visit: Payer: Medicare Other | Admitting: Podiatry

## 2020-06-15 DIAGNOSIS — I482 Chronic atrial fibrillation, unspecified: Secondary | ICD-10-CM | POA: Diagnosis not present

## 2020-06-15 DIAGNOSIS — I5041 Acute combined systolic (congestive) and diastolic (congestive) heart failure: Secondary | ICD-10-CM | POA: Diagnosis not present

## 2020-06-15 DIAGNOSIS — I10 Essential (primary) hypertension: Secondary | ICD-10-CM | POA: Diagnosis not present

## 2020-06-15 DIAGNOSIS — I2510 Atherosclerotic heart disease of native coronary artery without angina pectoris: Secondary | ICD-10-CM | POA: Diagnosis not present

## 2020-06-15 LAB — PROTIME-INR
INR: 3.4 — ABNORMAL HIGH (ref 0.8–1.2)
Prothrombin Time: 34.2 seconds — ABNORMAL HIGH (ref 11.4–15.2)

## 2020-06-15 LAB — BASIC METABOLIC PANEL
Anion gap: 7 (ref 5–15)
BUN: 24 mg/dL — ABNORMAL HIGH (ref 8–23)
CO2: 30 mmol/L (ref 22–32)
Calcium: 8.4 mg/dL — ABNORMAL LOW (ref 8.9–10.3)
Chloride: 100 mmol/L (ref 98–111)
Creatinine, Ser: 0.9 mg/dL (ref 0.61–1.24)
GFR, Estimated: >60 mL/min (ref >60)
Glucose, Bld: 104 mg/dL — ABNORMAL HIGH (ref 70–99)
Potassium: 4.2 mmol/L (ref 3.5–5.1)
Sodium: 137 mmol/L (ref 135–145)

## 2020-06-15 LAB — URINE CULTURE: Culture: NO GROWTH

## 2020-06-15 LAB — HEMOGLOBIN AND HEMATOCRIT, BLOOD
HCT: 39.3 % (ref 39.0–52.0)
Hemoglobin: 13 g/dL (ref 13.0–17.0)

## 2020-06-15 MED ORDER — WARFARIN 0.5 MG HALF TABLET
0.5000 mg | ORAL_TABLET | Freq: Once | ORAL | Status: AC
  Filled 2020-06-15: qty 1

## 2020-06-15 NOTE — Progress Notes (Signed)
PT Cancellation Note  Patient Details Name: Tyler Williams MRN: [id number] DOB: 1926/11/02   Cancelled Treatment:    Reason Eval/Treat Not Completed: Patient at procedure or test/unavailable Pt off the floor for CXR during attempt and on second attempt RN said pt is bleeding from foley catheter site so awaiting urology. Will follow.   Marguarite Arbour A Kimbria Camposano 06/15/2020, 2:37 PM Marisa Severin, PT, DPT Acute Rehabilitation Services Pager [phone] Office [phone]

## 2020-06-15 NOTE — Plan of Care (Signed)
Pt experiencing confusion this shift and is therefore having a challenging time remembering educational materials related to medications and therapies. Will to continue to re-orient and reinforce education.   Problem: Clinical Measurements: Goal: Ability to maintain clinical measurements within normal limits will improve Outcome: Progressing Goal: Will remain free from infection Outcome: Progressing Goal: Respiratory complications will improve Outcome: Progressing Goal: Cardiovascular complication will be avoided Outcome: Progressing   Problem: Nutrition: Goal: Adequate nutrition will be maintained Outcome: Progressing   Problem: Elimination: Goal: Will not experience complications related to urinary retention Outcome: Progressing   Problem: Pain Managment: Goal: General experience of comfort will improve Outcome: Progressing   Problem: Skin Integrity: Goal: Risk for impaired skin integrity will decrease Outcome: Progressing   Problem: Education: Goal: Ability to demonstrate management of disease process will improve Outcome: Not Progressing Goal: Ability to verbalize understanding of medication therapies will improve Outcome: Not Progressing   Problem: Education: Goal: Knowledge of General Education information will improve Description: Including pain rating scale, medication(s)/side effects and non-pharmacologic comfort measures Outcome: Not Progressing

## 2020-06-15 NOTE — Progress Notes (Signed)
Progress Note  Patient Name: Tyler Williams Date of Encounter: 06/15/2020  [facility] Cardiologist: Sherren Mocha, MD   Subjective   Sitting up in bed eating breakfast.  Feeling much better.  Still has a cough and pedal edema  Inpatient Medications    Scheduled Meds: . aspirin EC  81 mg Oral Daily  . atorvastatin  20 mg Oral Daily  . Chlorhexidine Gluconate Cloth  6 each Topical Daily  . fesoterodine  8 mg Oral QPM  . finasteride  5 mg Oral Daily  . furosemide  40 mg Intravenous BID  . guaiFENesin  1,200 mg Oral BID  . hydrALAZINE  10 mg Oral Q8H  . isosorbide mononitrate  120 mg Oral Daily  . metoprolol succinate  25 mg Oral Daily  . oseltamivir  30 mg Oral BID  . ramipril  10 mg Oral BID  . tamsulosin  0.4 mg Oral Daily  . warfarin  0.5 mg Oral ONCE-1600  . Warfarin - Pharmacist Dosing Inpatient   Does not apply q1600   Continuous Infusions: . cefTRIAXone (ROCEPHIN)  IV Stopped (06/15/20 0449)   PRN Meds: HYDROmorphone (DILAUDID) injection, ipratropium-albuterol, LORazepam, nitroGLYCERIN, polyethylene glycol   Vital Signs    Vitals:   06/14/20 1145 06/14/20 2044 06/15/20 0030 06/15/20 0427  BP: (!) 116/59 (!) 100/51  (!) 148/62  Pulse: 69 75  68  Resp: 20 20  16   Temp: 98.1 F (36.7 C) 97.9 F (36.6 C)  98.2 F (36.8 C)  TempSrc: Oral   Oral  SpO2: 92% 95%  95%  Weight:   77.8 kg   Height:        Intake/Output Summary (Last 24 hours) at 06/15/2020 0821 Last data filed at 06/15/2020 0651 Gross per 24 hour  Intake 634 ml  Output 3500 ml  Net -2866 ml   Last 3 Weights 06/15/2020 06/14/2020 06/13/2020  Weight (lbs) 171 lb 8.3 oz 170 lb 10.2 oz 172 lb  Weight (kg) 77.8 kg 77.4 kg 78.019 kg      Telemetry    Atrial fibrillation with CVR- Personally Reviewed  ECG    No new EKG to review - Personally Reviewed  Physical Exam   GEN: Well nourished, well developed in no acute distress HEENT: Normal NECK: No JVD; No carotid bruits LYMPHATICS: No  lymphadenopathy CARDIAC:irregularly irregular , no murmurs, rubs, gallops RESPIRATORY:  Clear to auscultation without rales, wheezing or rhonchi  ABDOMEN: Soft, non-tender, non-distended MUSCULOSKELETAL:  2+ pedal edema; No deformity  SKIN: Warm and dry NEUROLOGIC:  Alert and oriented x 3 PSYCHIATRIC:  Normal affect    Labs    High Sensitivity Troponin:   Recent Labs  Lab 06/12/20 0027 06/12/20 0239 06/12/20 0354 06/12/20 0532  TROPONINIHS 103* 117* 105* 111*      Chemistry Recent Labs  Lab 06/12/20 0027 06/12/20 0354 06/13/20 0231 06/14/20 0420 06/15/20 0403  NA 136   < > 138 138 137  K 3.4*   < > 3.6 4.6 4.2  CL 107   < > 102 99 100  CO2 22   < > 27 29 30   GLUCOSE 163*   < > 98 108* 104*  BUN 21   < > 16 15 24*  CREATININE 0.89   < > 0.83 0.90 0.90  CALCIUM 8.0*   < > 8.3* 8.5* 8.4*  PROT 5.8*  --   --   --   --   ALBUMIN 2.6*  --   --   --   --  AST 40  --   --   --   --   ALT 25  --   --   --   --   ALKPHOS 100  --   --   --   --   BILITOT 1.1  --   --   --   --   GFRNONAA >60   < > >60 >60 >60  ANIONGAP 7   < > 9 10 7    < > = values in this interval not displayed.     Hematology Recent Labs  Lab 06/12/20 0354 06/13/20 0231 06/14/20 0420 06/15/20 0403  WBC 3.6* 4.3 7.2  --   RBC 4.70 4.89 4.74  --   HGB 14.7 15.2 14.7 13.0  HCT 45.4 46.4 45.2 39.3  MCV 96.6 94.9 95.4  --   MCH 31.3 31.1 31.0  --   MCHC 32.4 32.8 32.5  --   RDW 15.9* 15.8* 15.7*  --   PLT 103* 103* 107*  --     BNP Recent Labs  Lab 06/12/20 0027  BNP 1,215.1*     DDimer No results for input(s): DDIMER in the last 168 hours.   CHA2DS2-VASc Score = 5  This indicates a 7.2% annual risk of stroke. The patient's score is based upon: CHF History: Yes HTN History: Yes Diabetes History: No Stroke History: No Vascular Disease History: Yes Age Score: 2 Gender Score: 0     Radiology    No results found.  Cardiac Studies   2D echo 05/2020 IMPRESSIONS   1. Left  ventricular ejection fraction, by estimation, is 45 to 50%. The  left ventricle has mildly decreased function. The left ventricle  demonstrates regional wall motion abnormalities (see scoring  diagram/findings for description). There is mild  concentric left ventricular hypertrophy. Left ventricular diastolic  parameters are consistent with Grade II diastolic dysfunction  (pseudonormalization). Elevated left atrial pressure. There is moderate  hypokinesis of the left ventricular, basal-mid  inferior wall and inferolateral wall.  2. Right ventricular systolic function is mildly reduced. The right  ventricular size is normal. There is mildly elevated pulmonary artery  systolic pressure.  3. Left atrial size was severely dilated.  4. Right atrial size was moderately dilated.  5. The mitral valve is normal in structure. Mild to moderate mitral valve  regurgitation.  6. The aortic valve has been repaired/replaced. Aortic valve  regurgitation is trivial. No aortic stenosis is present. There is a 29 mm  Sapien prosthetic (TAVR) valve present in the aortic position. Aortic  valve mean gradient measures 6.0 mmHg. Aortic  valve Vmax measures 1.82 m/s.  7. The inferior vena cava is dilated in size with <50% respiratory  variability, suggesting right atrial pressure of 15 mmHg.   Comparison(s): Prior images unable to be directly viewed, comparison made  by report only. The left ventricular function is worsened. The left  ventricular wall motion abnormality is unchanged.   Patient Profile     87 y.o. male with a PMH of CAD s/p POBA to RCA remotely, permanent atrial fibrillation on coumadin, chronic diastolic CHF, aortic stenosis s/p TAVR in 2018, AAA s/p repair, chronic venous insufficiency, HTN, HLD, who is being seen for the evaluation of abnormal echocardiogram at the request of Dr. Doristine Bosworth  Assessment & Plan    1. Acute on chronic diastolic CHF: patient has known history of diastolic  CHF, now with evidence of systolic dysfunction with EF 45-50% on echo this admission. Side by side  comparison was not available to compare to previous though on Dr. Theodosia Blender review, likely EF is in the 50-55% range. RWMA were noted to be unchanged. He has had progressive SOB and LE edema recently. BNP elevated to 1200. Suspect poor nutrition status is also contributing to his volume overload given albumin 2.6.  - He was started on IV lasix 20mg  BID  - he put out 3.5L yesterday and was net neg 6.3L - renal function stable with SCr 0.90 and K+ 4.2 today - still has significant pedal edema and junky sounding lungs which is difficult to discern whether this is fluid or related to his influenza>>will check a Cxray today - Continue IV lasix 20mg  BID - Continue strict I&Os and daily weights - Continue to monitor electrolytes and replete as needed to maintain K >4, Mg >2  2. Influenza A with superimposed bacterial PNA: likely contributing to his symptoms of cough and weakness. CXR showed moderate bibasilar atelectasis/infiltrate. He was started on tamiflu and CTX.  - Continue antibiotics and supportive care per primary team  3. CAD s/p POBA to RCA remotely: no recent anginal complaints. HsTrop 105>111, low flat trend not c/w ACS. EKG non-ischemic.  - Continue aspirin and statin - Continue BBlocker - Continue imdur  4. Permanent atrial fibrillation: rates well controlled this admission. INR has been supratherapeutic 7.7>7.9>6.3>3.8.  - HR remains controlled on tele - Continue metoprolol for rate control - Continue coumadin per pharmacy with goal INR 2-3  5. Aortic stenosis s/p TAVR in 2018: valve appears stable on echo this admission - Continue routine monitoring.   6. HTN:  - BP borderline controlled at 148/62mmHg this am but lower throughout the day yesterday -will continue to follow - Continue lasix, imdur, metoprolol and ramipril. - stopped amlodipine due to LE edema yesterday and added  Hydralazine 10mg  TID  7. Urinary retention: patient with difficulty urinating today. Bladder scan with 500-600cc. RN attempted 2 foley placements but unsuccessful. Urology consulted - catheter placed and now with gross hematuria due to traumatic placement on anticoagulation - Continue management per primary team and urology.   8. HLD:  - Continue atorvastatin    I have spent a total of 35 minutes with patient reviewing 2D echo , telemetry, EKGs, labs and examining patient as well as establishing an assessment and plan that was discussed with the patient.  > 50% of time was spent in direct patient care.    For questions or updates, please contact [place] Please consult [email or web address] for contact info under        Signed, Fransico Him, MD  06/15/2020, 8:21 AM

## 2020-06-15 NOTE — Progress Notes (Signed)
ANTICOAGULATION CONSULT NOTE - Follow Up Consult  Pharmacy Consult for Coumadin Indication: atrial fibrillation  Allergies  Allergen Reactions  . No Known Allergies Other (See Comments)    NKA    Patient Measurements: Height: 5\' 8"  (172.7 cm) Weight: 77.8 kg (171 lb 8.3 oz) IBW/kg (Calculated) : 68.4  Vital Signs: Temp: 98.2 F (36.8 C) (05/03 0427) Temp Source: Oral (05/03 0427) BP: 148/62 (05/03 0427) Pulse Rate: 68 (05/03 0427)  Labs: Recent Labs    06/13/20 0231 06/14/20 0420 06/15/20 0403  HGB 15.2 14.7 13.0  HCT 46.4 45.2 39.3  PLT 103* 107*  --   LABPROT 55.2* 37.2* 34.2*  INR 6.3* 3.8* 3.4*  CREATININE 0.83 0.90 0.90    Estimated Creatinine Clearance: 50.7 mL/min (by C-G formula based on SCr of 0.9 mg/dL).  Assessment:  Anticoag: Warfarin PTA for afib. Plt 107 (baseline ~120). INR down to 3.4. Hematuria noted (see Urology note) - Home dose: 5mg  daily - last dose 4/29, Admit INR 7.7  Goal of Therapy:  INR 2-3 Monitor platelets by anticoagulation protocol: Yes   Plan:  Coumadin 0.5 mg po x 1 tonight. Daily INR   Rodrickus Min S. Alford Highland, PharmD, BCPS Clinical Staff Pharmacist [email or web address] Alford Highland, [place] 06/15/2020,8:06 AM

## 2020-06-15 NOTE — NC FL2 (Signed)
[place] LEVEL OF CARE SCREENING TOOL     IDENTIFICATION  Patient Name: Tyler Williams Birthdate: May 12, 1926 Sex: male Admission Date (Current Location): 06/11/2020  [facility] and [place] Number:  Herbalist and Address:  [facility] [place]. [facility], [place] [address] [place], [place] [address]      Provider Number: [id number]  Attending Physician Name and Address:  Cristal Ford, DO  Relative Name and Phone Number:  DONALD,WENDY (Daughter)   [phone]    Current Level of Care: Hospital Recommended Level of Care: [facility] Prior Approval Number:    Date Approved/Denied:   PASRR Number:    Discharge Plan: Other (Comment) ([facility])    Current Diagnoses: Patient Active Problem List   Diagnosis Date Noted  . Acute CHF (congestive heart failure) ([place]) 06/12/2020  . Aorta aneurysm ([place]) 02/14/2018  . Chronic diastolic CHF (congestive heart failure) ([place]) 09/25/2017  . Precordial chest pain 08/28/2017  . HLD (hyperlipidemia) 08/28/2017  . Anticoagulated on Coumadin 08/28/2017  . HTN (hypertension) 08/28/2017  . S/P TAVR (transcatheter aortic valve replacement)   . 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 Incidental cecal mass noted on CT imaging 05/24/2016  . Exudative age-related macular 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 ([place]) 12/22/2014  . Encounter for therapeutic drug monitoring 03/20/2013  . Pseudophakia of both eyes 03/19/2013  . Macular degeneration 03/19/2013  . Cholecystitis, acute 12/06/2012  . Chest pain, atypical 12/03/2012  . Hematuria, microscopic 12/03/2012  . Cystoid macular edema 05/14/2012  . Overweight (BMI 25.0-29.9) 01/23/2012  . Hyponatremia 01/23/2012  . S/P right UKR 01/22/2012  . Carotid artery bruit 11/21/2011  . Carotid bruit 11/21/2011  . Presence of intraocular lens 02/16/2011  . EDEMA 07/14/2009  . Essential hypertension  01/27/2009  . HYPERCHOLESTEROLEMIA  IIA 10/21/2008  . Coronary artery disease involving native coronary artery with angina pectoris ([place]) 10/21/2008  . Permanent atrial fibrillation ([place]) 10/21/2008    Orientation RESPIRATION BLADDER Height & Weight     Self,Time,Situation  Normal External catheter Weight: 171 lb 8.3 oz (77.8 kg) Height:  5\' 8"  (172.7 cm)  BEHAVIORAL SYMPTOMS/MOOD NEUROLOGICAL BOWEL NUTRITION STATUS      Continent Diet (See DC Summary)  AMBULATORY STATUS COMMUNICATION OF NEEDS Skin   Limited Assist Verbally                         Personal Care Assistance Level of Assistance  Bathing,Feeding,Dressing Bathing Assistance: Limited assistance Feeding assistance: Independent Dressing Assistance: Limited assistance     Functional Limitations Info  Sight,Hearing,Speech Sight Info: Impaired Hearing Info: Adequate Speech Info: Adequate    SPECIAL CARE FACTORS FREQUENCY  PT (By licensed PT),OT (By licensed OT)     PT Frequency: 5x a week OT Frequency: 5x a week            Contractures Contractures Info: Not present    Additional Factors Info  Code Status,Allergies Code Status Info: Full Allergies Info: NKA           Current Medications (06/15/2020):  This is the current hospital active medication list Current Facility-Administered Medications  Medication Dose Route Frequency Provider Last Rate Last Admin  . aspirin EC tablet 81 mg  81 mg Oral Daily Irene Pap N, DO   81 mg at 06/15/20 0930  . atorvastatin (LIPITOR) tablet 20 mg  20 mg Oral Daily Pahwani, Rinka R, MD   20 mg at 06/15/20  0930  . cefTRIAXone (ROCEPHIN) 2 g in sodium chloride 0.9 % 100 mL IVPB  2 g Intravenous Q24H Pahwani, Michell Heinrich, MD   Stopped at 06/15/20 0449  . Chlorhexidine Gluconate Cloth 2 % PADS 6 each  6 each Topical Daily Ermelinda Das, MD   6 each at 06/15/20 1000  . fesoterodine (TOVIAZ) tablet 8 mg  8 mg Oral QPM Hall, Carole N, DO   8 mg at 06/14/20 1641  .  finasteride (PROSCAR) tablet 5 mg  5 mg Oral Daily Alexis Frock, MD   5 mg at 06/15/20 0930  . furosemide (LASIX) injection 40 mg  40 mg Intravenous BID Pahwani, Rinka R, MD   40 mg at 06/15/20 0926  . guaiFENesin (MUCINEX) 12 hr tablet 1,200 mg  1,200 mg Oral BID Irene Pap N, DO   1,200 mg at 06/15/20 0930  . hydrALAZINE (APRESOLINE) tablet 10 mg  10 mg Oral Q8H Turner, Traci R, MD   10 mg at 06/15/20 1401  . HYDROmorphone (DILAUDID) injection 0.5 mg  0.5 mg Intravenous Q4H PRN Pahwani, Rinka R, MD   0.5 mg at 06/13/20 1612  . ipratropium-albuterol (DUONEB) 0.5-2.5 (3) MG/3ML nebulizer solution 3 mL  3 mL Nebulization TID PRN Cristal Ford, DO      . isosorbide mononitrate (IMDUR) 24 hr tablet 120 mg  120 mg Oral Daily Hall, Carole N, DO   120 mg at 06/15/20 0930  . LORazepam (ATIVAN) tablet 0.5 mg  0.5 mg Oral QHS PRN Irene Pap N, DO      . metoprolol succinate (TOPROL-XL) 24 hr tablet 25 mg  25 mg Oral Daily Hall, Carole N, DO   25 mg at 06/15/20 0930  . nitroGLYCERIN (NITROSTAT) SL tablet 0.4 mg  0.4 mg Sublingual Q5 min PRN Pahwani, Rinka R, MD      . oseltamivir (TAMIFLU) capsule 30 mg  30 mg Oral BID Irene Pap N, DO   30 mg at 06/15/20 0931  . polyethylene glycol (MIRALAX / GLYCOLAX) packet 17 g  17 g Oral Daily PRN Pahwani, Rinka R, MD      . ramipril (ALTACE) capsule 10 mg  10 mg Oral BID Pahwani, Rinka R, MD   10 mg at 06/15/20 0929  . tamsulosin (FLOMAX) capsule 0.4 mg  0.4 mg Oral Daily Alexis Frock, MD   0.4 mg at 06/15/20 0930  . warfarin (COUMADIN) tablet 0.5 mg  0.5 mg Oral ONCE-1600 Karren Cobble, [place]      . Warfarin - Pharmacist Dosing Inpatient   Does not apply [place], [facility]         Discharge Medications: Please see discharge summary for a list of discharge medications.  Relevant Imaging Results:  Relevant Lab Results:   Additional Information SSN# [id number]  Reece Agar, [place]

## 2020-06-15 NOTE — Progress Notes (Addendum)
PROGRESS NOTE    Tyler Williams  WFU:[id number] DOB: 06/15/1926 DOA: 06/11/2020 PCP: Burnard Bunting, MD   Brief Narrative:  HPI on 06/12/2020 by Dr. [name] Jarmarcus Tyler Williams is a 87 y.o. male with medical history significant for permanent A. fib on Coumadin, coronary artery disease, peripheral artery disease, AAA s/p repair, aortic stenosis status post TAVR in 2025, chronic diastolic CHF, chronic venous insufficiency who presented to [facility] ED due to worsening semiproductive cough of 4 to 5 days duration.  Associated with worsening bilateral lower extremity edema and generalized weakness.  Denies fever.  No chest pain.  He presented to the ED for further evaluation.  History is mainly obtained from EDP, his daughter at bedside and from review of medical records.  Patient has his eyes closed during interview and is minimally interactive.  Upon presentation to the ED work-up revealed acute on chronic diastolic CHF with BNP greater than 1200, chest x-ray with increase in pulmonary vascularity as well as bibasilar infiltrates.  Influenza A positive, COVID-19 negative on 06/12/2020.  INR supratherapeutic 7.7.  Started on IV diuretics by EDP for acute CHF.  TRH, hospitalist team, was asked to admit.  Interim history Presented with generalized weakness, cough and lower extremity edema.  Patient admitted with CHF exacerbation along with influenza A viral pneumonia.  Currently on IV Lasix as well as Tamiflu and ceftriaxone.  Cardiology consulted and appreciated.  Hospitalization, gated by urinary retention, and traumatic Foley placement.  Patient with frank hematuria.  Urology consulted and following. Assessment & Plan   Acute on chronic combined systolic and diastolic heart failure -Likely secondary to noncompliance with Lasix -Patient presented with signs of fluid overload as well as lower extremity edema -BNP 1215.1 -Echocardiogram showed an EF of 45 to 50%, mild LVH.  Grade 2 diastolic  dysfunction. -Cardiology consulted and appreciated -Continue IV Lasix 40 mg IV twice daily -Monitor intake and output, daily weights -Continues to have lower extremity edema -pending CXR  Influenza A viral pneumonia with superimposed bacterial infection -Chest x-ray showed mild to moderate severity bibasilar atelectasis and/or infiltrate -Placed on Tamiflu 30 mg twice daily for 5 days -Continue ceftriaxone -Procalcitonin <0.10 -Continue nebulizer treatments as needed for wheezing or shortness of breath -Continue Mucinex, flutter valve, incentive spirometry  Elevated troponin/history of coronary disease -Suspect troponin secondary to demand ischemia in the setting of viral and bacterial illness along with CHF exacerbation -Echocardiogram as above -Cardiology consulted and appreciate -Currently denies chest pain  Acute urinary retention with frank hematuria with UTI -Urine analysis showed many bacteria, > 50 WBC, moderate leukocytes, positive nitrites, large hemoglobin -Urine culture was not obtained, and may not show anything given that patient is already been on antibiotics (ceftriaxone).  However will obtain urine culture today -Urology consulted and appreciated, status post Foley catheter placement.  Recommendations were for Foley catheter to remain in place for 7 to 10 days.  Continue Flomax and finasteride.  Hematuria expected in the setting of Foley trauma and ongoing blood thinning medications. -Currently on toviaz -patient continues to have frank hematuria- however is needing frequent flushes  -Discussed with urology, this will likely take time and will just have to wait and see if it clears up.  -hemoglobin currently 13 (stable)  Permanent atrial fibrillation on Coumadin with supratherapeutic INR -Coumadin per pharmacy -INR is trending downward, currently 3.4 -Continue Toprol for rate control -Monitor CBC closely  Essential hypertension -BP stable, continue ramipril,  metoprolol, Imdur  Generalized weakness -Likely secondary to the  above -PT consulted recommending home health -OT evaluated patient, no further needs  Hypokalemia/hypomagnesemia -Resolved with replacement -Continue to monitor and replace as needed  Thrombocytopenia -Continue to monitor CBC  DVT Prophylaxis SCDs  Code Status: Full  Family Communication: None at bedside  Disposition Plan:  Status is: Inpatient  Remains inpatient appropriate because:IV treatments appropriate due to intensity of illness or inability to take PO and Inpatient level of care appropriate due to severity of illness   Dispo: The patient is from: ALF              Anticipated d/c is to: ALF with home health services              Patient currently is not medically stable to d/c.   Difficult to place patient No  Consultants Cardiology Urology  Procedures  Echocardiogram Foley catheter placement by urology  Antibiotics   Anti-infectives (From admission, onward)   Start     Dose/Rate Route Frequency Ordered Stop   06/14/20 0400  cefTRIAXone (ROCEPHIN) 2 g in sodium chloride 0.9 % 100 mL IVPB        2 g 200 mL/hr over 30 Minutes Intravenous Every 24 hours 06/13/20 0739 06/17/20 0359   06/12/20 0400  cefTRIAXone (ROCEPHIN) 1 g in sodium chloride 0.9 % 100 mL IVPB  Status:  Discontinued        1 g 200 mL/hr over 30 Minutes Intravenous Every 24 hours 06/12/20 0340 06/13/20 0739   06/12/20 0400  oseltamivir (TAMIFLU) capsule 30 mg        30 mg Oral 2 times daily 06/12/20 0345 06/17/20 1761      Subjective:   Tyler Williams seen and examined today.  Patient continues to complain of cough but feels his breathing has improved.  Denies current chest pain, abdominal pain, nausea or vomiting, diarrhea or constipation, dizziness or headache.  Complains of his legs feeling tight.    Objective:   Vitals:   06/14/20 2044 06/15/20 0030 06/15/20 0427 06/15/20 0905  BP: (!) 100/51  (!) 148/62 (!) 119/53   Pulse: 75  68 66  Resp: 20  16   Temp: 97.9 F (36.6 C)  98.2 F (36.8 C) 97.8 F (36.6 C)  TempSrc:   Oral Oral  SpO2: 95%  95% 97%  Weight:  77.8 kg    Height:        Intake/Output Summary (Last 24 hours) at 06/15/2020 1213 Last data filed at 06/15/2020 0900 Gross per 24 hour  Intake 694 ml  Output 2775 ml  Net -2081 ml   Filed Weights   06/13/20 0307 06/14/20 0153 06/15/20 0030  Weight: 78 kg 77.4 kg 77.8 kg    Exam  General: Well developed, elderly, chronically ill-appearing, NAD  HEENT: NCAT, mucous membranes moist.   Cardiovascular: S1 S2 auscultated, irregular, no murmur  Respiratory: Diffuse coarse breath sounds, cough  Abdomen: Soft, nontender, nondistended, + bowel sounds  GU: Foley catheter in place, noted to have hematuria and bag  Extremities: warm dry without cyanosis clubbing.  2+ LE edema bilaterally  Neuro: AAOx3, nonfocal  Psych: pleasant, appropriate mood and affect   Data Reviewed: I have personally reviewed following labs and imaging studies  CBC: Recent Labs  Lab 06/12/20 0027 06/12/20 0354 06/13/20 0231 06/14/20 0420 06/15/20 0403  WBC 3.2* 3.6* 4.3 7.2  --   NEUTROABS 2.0  --   --   --   --   HGB 14.0 14.7 15.2 14.7 13.0  HCT  44.4 45.4 46.4 45.2 39.3  MCV 98.2 96.6 94.9 95.4  --   PLT 100* 103* 103* 107*  --    Basic Metabolic Panel: Recent Labs  Lab 06/12/20 0027 06/12/20 0354 06/13/20 0231 06/14/20 0420 06/15/20 0403  NA 136 142 138 138 137  K 3.4* 3.4* 3.6 4.6 4.2  CL 107 107 102 99 100  CO2 22 26 27 29 30   GLUCOSE 163* 87 98 108* 104*  BUN 21 20 16 15  24*  CREATININE 0.89 0.88 0.83 0.90 0.90  CALCIUM 8.0* 8.5* 8.3* 8.5* 8.4*  MG  --  2.0 1.8 2.1  --   PHOS  --  3.0 2.6  --   --    GFR: Estimated Creatinine Clearance: 50.7 mL/min (by C-G formula based on SCr of 0.9 mg/dL). Liver Function Tests: Recent Labs  Lab 06/12/20 0027  AST 40  ALT 25  ALKPHOS 100  BILITOT 1.1  PROT 5.8*  ALBUMIN 2.6*   No  results for input(s): LIPASE, AMYLASE in the last 168 hours. No results for input(s): AMMONIA in the last 168 hours. Coagulation Profile: Recent Labs  Lab 06/12/20 0027 06/12/20 0451 06/13/20 0231 06/14/20 0420 06/15/20 0403  INR 7.7* 7.9* 6.3* 3.8* 3.4*   Cardiac Enzymes: No results for input(s): CKTOTAL, CKMB, CKMBINDEX, TROPONINI in the last 168 hours. BNP (last 3 results) No results for input(s): PROBNP in the last 8760 hours. HbA1C: No results for input(s): HGBA1C in the last 72 hours. CBG: No results for input(s): GLUCAP in the last 168 hours. Lipid Profile: No results for input(s): CHOL, HDL, LDLCALC, TRIG, CHOLHDL, LDLDIRECT in the last 72 hours. Thyroid Function Tests: No results for input(s): TSH, T4TOTAL, FREET4, T3FREE, THYROIDAB in the last 72 hours. Anemia Panel: No results for input(s): VITAMINB12, FOLATE, FERRITIN, TIBC, IRON, RETICCTPCT in the last 72 hours. Urine analysis:    Component Value Date/Time   COLORURINE RED (A) 06/13/2020 1335   APPEARANCEUR CLOUDY (A) 06/13/2020 1335   LABSPEC 1.010 06/13/2020 1335   PHURINE 6.5 06/13/2020 1335   GLUCOSEU NEGATIVE 06/13/2020 1335   HGBUR LARGE (A) 06/13/2020 1335   BILIRUBINUR NEGATIVE 06/13/2020 1335   KETONESUR 15 (A) 06/13/2020 1335   PROTEINUR >300 (A) 06/13/2020 1335   UROBILINOGEN 0.2 09/27/2013 2341   NITRITE POSITIVE (A) 06/13/2020 1335   LEUKOCYTESUR MODERATE (A) 06/13/2020 1335   Sepsis Labs: @LABRCNTIP (procalcitonin:4,lacticidven:4)  ) Recent Results (from the past 240 hour(s))  Resp Panel by RT-PCR (Flu A&B, Covid) Nasopharyngeal Swab     Status: Abnormal   Collection Time: 06/12/20 12:27 AM   Specimen: Nasopharyngeal Swab; Nasopharyngeal(NP) swabs in vial transport medium  Result Value Ref Range Status   SARS Coronavirus 2 by RT PCR NEGATIVE NEGATIVE Final    Comment: (NOTE) SARS-CoV-2 target nucleic acids are NOT DETECTED.  The SARS-CoV-2 RNA is generally detectable in upper  respiratory specimens during the acute phase of infection. The lowest concentration of SARS-CoV-2 viral copies this assay can detect is 138 copies/mL. A negative result does not preclude SARS-Cov-2 infection and should not be used as the sole basis for treatment or other patient management decisions. A negative result may occur with  improper specimen collection/handling, submission of specimen other than nasopharyngeal swab, presence of viral mutation(s) within the areas targeted by this assay, and inadequate number of viral copies(<138 copies/mL). A negative result must be combined with clinical observations, patient history, and epidemiological information. The expected result is Negative.  Fact Sheet for Patients:  EntrepreneurPulse.com.au  Fact  Sheet for Healthcare Providers:  IncredibleEmployment.be  This test is no t yet approved or cleared by the [place] FDA and  has been authorized for detection and/or diagnosis of SARS-CoV-2 by FDA under an Emergency Use Authorization (EUA). This EUA will remain  in effect (meaning this test can be used) for the duration of the COVID-19 declaration under Section 564(b)(1) of the Act, 21 U.S.C.section 360bbb-3(b)(1), unless the authorization is terminated  or revoked sooner.       Influenza A by PCR POSITIVE (A) NEGATIVE Final   Influenza B by PCR NEGATIVE NEGATIVE Final    Comment: (NOTE) The Xpert Xpress SARS-CoV-2/FLU/RSV plus assay is intended as an aid in the diagnosis of influenza from Nasopharyngeal swab specimens and should not be used as a sole basis for treatment. Nasal washings and aspirates are unacceptable for Xpert Xpress SARS-CoV-2/FLU/RSV testing.  Fact Sheet for Patients: EntrepreneurPulse.com.au  Fact Sheet for Healthcare Providers: IncredibleEmployment.be  This test is not yet approved or cleared by the [place] FDA and has been  authorized for detection and/or diagnosis of SARS-CoV-2 by FDA under an Emergency Use Authorization (EUA). This EUA will remain in effect (meaning this test can be used) for the duration of the COVID-19 declaration under Section 564(b)(1) of the Act, 21 U.S.C. section 360bbb-3(b)(1), unless the authorization is terminated or revoked.  Performed at [facility], [place] [address], [place], [place] [address]   Culture, Urine     Status: None   Collection Time: 06/14/20  9:10 AM   Specimen: Urine, Random  Result Value Ref Range Status   Specimen Description URINE, RANDOM  Final   Special Requests NONE  Final   Culture   Final    NO GROWTH Performed at [facility], [place] [address] [place], [place] [address]    Report Status 06/15/2020 FINAL  Final      Radiology Studies: No results found.   Scheduled Meds: . aspirin EC  81 mg Oral Daily  . atorvastatin  20 mg Oral Daily  . Chlorhexidine Gluconate Cloth  6 each Topical Daily  . fesoterodine  8 mg Oral QPM  . finasteride  5 mg Oral Daily  . furosemide  40 mg Intravenous BID  . guaiFENesin  1,200 mg Oral BID  . hydrALAZINE  10 mg Oral Q8H  . isosorbide mononitrate  120 mg Oral Daily  . metoprolol succinate  25 mg Oral Daily  . oseltamivir  30 mg Oral BID  . ramipril  10 mg Oral BID  . tamsulosin  0.4 mg Oral Daily  . warfarin  0.5 mg Oral ONCE-1600  . Warfarin - Pharmacist Dosing Inpatient   Does not apply q1600   Continuous Infusions: . cefTRIAXone (ROCEPHIN)  IV Stopped (06/15/20 0449)     LOS: 3 days   Time Spent in minutes   45 minutes  Henritta Mutz D.O. on 06/15/2020 at 12:13 PM  Between 7am to 7pm - Please see pager noted on [email or web address]  After 7pm go to [email or web address]  And look for the night coverage person covering for me after hours  [facility] Office  [phone]

## 2020-06-16 ENCOUNTER — Other Ambulatory Visit (HOSPITAL_COMMUNITY): Payer: Self-pay

## 2020-06-16 DIAGNOSIS — I420 Dilated cardiomyopathy: Secondary | ICD-10-CM | POA: Diagnosis not present

## 2020-06-16 DIAGNOSIS — I5031 Acute diastolic (congestive) heart failure: Secondary | ICD-10-CM | POA: Diagnosis not present

## 2020-06-16 DIAGNOSIS — I482 Chronic atrial fibrillation, unspecified: Secondary | ICD-10-CM | POA: Diagnosis not present

## 2020-06-16 DIAGNOSIS — I10 Essential (primary) hypertension: Secondary | ICD-10-CM | POA: Diagnosis not present

## 2020-06-16 DIAGNOSIS — I509 Heart failure, unspecified: Secondary | ICD-10-CM | POA: Diagnosis not present

## 2020-06-16 DIAGNOSIS — J101 Influenza due to other identified influenza virus with other respiratory manifestations: Secondary | ICD-10-CM | POA: Diagnosis not present

## 2020-06-16 LAB — BASIC METABOLIC PANEL
Anion gap: 5 (ref 5–15)
BUN: 22 mg/dL (ref 8–23)
CO2: 28 mmol/L (ref 22–32)
Calcium: 8.1 mg/dL — ABNORMAL LOW (ref 8.9–10.3)
Chloride: 103 mmol/L (ref 98–111)
Creatinine, Ser: 0.75 mg/dL (ref 0.61–1.24)
GFR, Estimated: >60 mL/min (ref >60)
Glucose, Bld: 94 mg/dL (ref 70–99)
Potassium: 3.3 mmol/L — ABNORMAL LOW (ref 3.5–5.1)
Sodium: 136 mmol/L (ref 135–145)

## 2020-06-16 LAB — CBC
HCT: 37.2 % — ABNORMAL LOW (ref 39.0–52.0)
Hemoglobin: 12.2 g/dL — ABNORMAL LOW (ref 13.0–17.0)
MCH: 31.3 pg (ref 26.0–34.0)
MCHC: 32.8 g/dL (ref 30.0–36.0)
MCV: 95.4 fL (ref 80.0–100.0)
Platelets: 121 10*3/uL — ABNORMAL LOW (ref 150–400)
RBC: 3.9 MIL/uL — ABNORMAL LOW (ref 4.22–5.81)
RDW: 15.4 % (ref 11.5–15.5)
WBC: 4.7 10*3/uL (ref 4.0–10.5)
nRBC: 0 % (ref 0.0–0.2)

## 2020-06-16 LAB — PROTIME-INR
INR: 2.2 — ABNORMAL HIGH (ref 0.8–1.2)
Prothrombin Time: 24.7 seconds — ABNORMAL HIGH (ref 11.4–15.2)

## 2020-06-16 MED ORDER — LOSARTAN POTASSIUM 50 MG PO TABS
100.0000 mg | ORAL_TABLET | Freq: Every day | ORAL | Status: DC
  Administered 2020-06-16: 100 mg via ORAL
  Filled 2020-06-16: qty 2

## 2020-06-16 MED ORDER — WARFARIN SODIUM 5 MG PO TABS
5.0000 mg | ORAL_TABLET | Freq: Once | ORAL | Status: AC
  Administered 2020-06-16: 5 mg via ORAL
  Filled 2020-06-16: qty 1

## 2020-06-16 MED ORDER — FUROSEMIDE 80 MG PO TABS
80.0000 mg | ORAL_TABLET | Freq: Every day | ORAL | Status: DC
  Administered 2020-06-16 – 2020-06-21 (×6): 80 mg via ORAL
  Filled 2020-06-16 (×6): qty 1

## 2020-06-16 MED ORDER — POTASSIUM CHLORIDE CRYS ER 20 MEQ PO TBCR
40.0000 meq | EXTENDED_RELEASE_TABLET | Freq: Two times a day (BID) | ORAL | Status: AC
  Administered 2020-06-16 (×2): 40 meq via ORAL
  Filled 2020-06-16 (×2): qty 2

## 2020-06-16 NOTE — Progress Notes (Signed)
Physical Therapy Treatment Patient Details Name: Tyler Williams MRN: [id number] DOB: 02-05-1927 Today's Date: 06/16/2020    History of Present Illness The pt is a 87 yo male presenting 4/29 from [facility] with BLE swelling and SOB that has been progressively worsening over last few days. Pt found to have elevated troponin, INR of 7.7, and the flu with PNA upon work up, admitted for management of troponin. PMH includes: afib, AAA s/p repair, arthritis, CAD, HTN, PVD, s/p TAVR (2018), and multiple joint replacements.    PT Comments    Pt demonstrates ability to ambulate for greater than 1000 ft during session, requiring no physical assistance. Pt tolerates sit>stand transfers without physical assistance. Pt does require a reminder for room number and location to return to room at end of session. Pt will continue to benefit from continued acute PT to address remaining deficits in strength and activity tolerance for improved independent mobility.    Follow Up Recommendations  Home health PT;Supervision for mobility/OOB     Equipment Recommendations  None recommended by PT    Recommendations for Other Services       Precautions / Restrictions Precautions Precautions: Fall Precaution Comments: fell on 4/24 Restrictions Weight Bearing Restrictions: No    Mobility  Bed Mobility               General bed mobility comments: Sitting in recliner upon PT arrival.    Transfers Overall transfer level: Needs assistance Equipment used: Rolling walker (2 wheeled) Transfers: Sit to/from Stand Sit to Stand: Min guard            Ambulation/Gait Ambulation/Gait assistance: Min guard Gait Distance (Feet): 1000 Feet (> 1000 ft) Assistive device: Rolling walker (2 wheeled) Gait Pattern/deviations: Step-through pattern;Decreased stride length;Trunk flexed   Gait velocity interpretation: 1.31 - 2.62 ft/sec, indicative of limited community ambulator General Gait Details: Pt  denies symptoms during gait, tolerates > 1044ft ambulation. Pt does forget room number and location when attempting to return to room.   Stairs             Wheelchair Mobility    Modified Rankin (Stroke Patients Only)       Balance Overall balance assessment: Needs assistance Sitting-balance support: No upper extremity supported;Feet supported Sitting balance-Leahy Scale: Good     Standing balance support: During functional activity;No upper extremity supported;Bilateral upper extremity supported Standing balance-Leahy Scale: Fair Standing balance comment: Pt able to don facemask without UE support.                            Cognition Arousal/Alertness: Awake/alert Behavior During Therapy: WFL for tasks assessed/performed Overall Cognitive Status: Impaired/Different from baseline Area of Impairment: Memory                     Memory: Decreased short-term memory                Exercises      General Comments General comments (skin integrity, edema, etc.): Pt denies any symptoms with ambulation, VSS on RA.      Pertinent Vitals/Pain Pain Assessment: No/denies pain    Home Living                      Prior Function            PT Goals (current goals can now be found in the care plan section) Acute Rehab PT Goals Patient Stated Goal:  Return home. Progress towards PT goals: Progressing toward goals    Frequency    Min 3X/week      PT Plan Current plan remains appropriate    Co-evaluation              AM-PAC PT "6 Clicks" Mobility   Outcome Measure  Help needed turning from your back to your side while in a flat bed without using bedrails?: A Little Help needed moving from lying on your back to sitting on the side of a flat bed without using bedrails?: A Little Help needed moving to and from a bed to a chair (including a wheelchair)?: A Little Help needed standing up from a chair using your arms (e.g.,  wheelchair or bedside chair)?: A Little Help needed to walk in hospital room?: A Little Help needed climbing 3-5 steps with a railing? : A Little 6 Click Score: 18    End of Session Equipment Utilized During Treatment: Gait belt Activity Tolerance: Patient tolerated treatment well Patient left: with chair alarm set;with call bell/phone within reach;with family/visitor present Nurse Communication: Mobility status PT Visit Diagnosis: Other abnormalities of gait and mobility (R26.89);Muscle weakness (generalized) (M62.81);History of falling (Z91.81)     Time: 8676-1950 PT Time Calculation (min) (ACUTE ONLY): 29 min  Charges:  $Therapeutic Activity: 23-37 mins                     Acute Rehab  Pager: [phone]    Garwin Brothers, SPT  06/16/2020, 4:00 PM

## 2020-06-16 NOTE — Plan of Care (Signed)
87 y.o. male with afib on coumadin, CAD, PAD, CHF who is hospitalized for CHF exacerbation, PNA found to be in urinary retention. Urology was consulted following several attempts at catheter placement by nursing staff and found to have false passage requiring catheter placement via cystoscopy over wire on 06/13/20.  Plan:   1.  Hematuria is expected in the setting of urethral trauma from attempted foley placements and blood thinning medications.  2. Foley catheter should be continued to drainage for 7-10days following placement due to greater than 1000 mL of urine returned with catheter placement/mild bladder stretch injury along with urethral trauma from attempted catheter placements. Additionally, patient has likely long been in urinary retention with overflow incontinence leading up to his hospitalization and removing catheter would not be a good option for him at this time. 2.  Remove Foley catheter with with [facility] in follow up in 1-2 weeks with TOV, patient will be contacted to schedule however is also encouraged to call and confirm follow up. 3. Please continue catheter care while foley is in place 4. Catheter can be hand irrigated as needed by nursing staff. Please call if catheter does not start draining despite flushing 5. Please discharge patient on flomax and finasteride  [facility]

## 2020-06-16 NOTE — Care Management Important Message (Signed)
Important Message  Patient Details  Name: Tyler Williams MRN: [id number] Date of Birth: [birth year]   Medicare Important Message Given:  Yes  Pt. On precaution,left letter w/RN.    [place], [place] 06/16/2020, 8:18 AM

## 2020-06-16 NOTE — Progress Notes (Signed)
  Mobility Specialist Criteria Algorithm Info.  Mobility Team: HOB elevated: Activity: Ambulated in hall; Transferred:  Bed to chair (to chair after ambulation) Range of motion: Active; All extremities Level of assistance: Standby assist, set-up cues, supervision of patient - no hands on (min A to boost) Assistive device: Front wheel walker Minutes sitting in chair:  Minutes stood: 8 minutes Minutes ambulated: 8 minutes Distance ambulated (ft): 500 ft Mobility response: Tolerated well Bed Position: Chair   06/16/2020 3:03 PM

## 2020-06-16 NOTE — NC FL2 (Signed)
[place] LEVEL OF CARE SCREENING TOOL     IDENTIFICATION  Patient Name: Tyler Williams Birthdate: 1926/08/20 Sex: male Admission Date (Current Location): 06/11/2020  [facility] [place] and [place] Number:  Herbalist and Address:  [facility], [place] [address], [place], [place] [address]      Provider Number: [id number]  Attending Physician Name and Address:  Charlynne Cousins, MD  Relative Name and Phone Number:  Brown Human (Daughter)   [phone]    Current Level of Care: Hospital Recommended Level of Care: [place] Prior Approval Number:    Date Approved/Denied:   PASRR Number:    Discharge Plan: Other (Comment) ([facility])    Current Diagnoses: Patient Active Problem List   Diagnosis Date Noted  . DCM (dilated cardiomyopathy) ([place])   . Chronic atrial fibrillation ([place])   . Acute CHF (congestive heart failure) ([place]) 06/12/2020  . Aorta aneurysm ([place]) 02/14/2018  . Chronic diastolic CHF (congestive heart failure) ([place]) 09/25/2017  . Precordial chest pain 08/28/2017  . HLD (hyperlipidemia) 08/28/2017  . Anticoagulated on Coumadin 08/28/2017  . HTN (hypertension) 08/28/2017  . S/P TAVR (transcatheter aortic valve replacement)   . 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 Incidental cecal mass noted on CT imaging 05/24/2016  . Exudative age-related macular 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 ([place]) 12/22/2014  . Encounter for therapeutic drug monitoring 03/20/2013  . Pseudophakia of both eyes 03/19/2013  . Macular degeneration 03/19/2013  . Cholecystitis, acute 12/06/2012  . Chest pain, atypical 12/03/2012  . Hematuria, microscopic 12/03/2012  . Cystoid macular edema 05/14/2012  . Overweight (BMI 25.0-29.9) 01/23/2012  . Hyponatremia 01/23/2012  . S/P right UKR 01/22/2012  . Carotid artery bruit 11/21/2011  . Carotid bruit 11/21/2011  .  Presence of intraocular lens 02/16/2011  . EDEMA 07/14/2009  . Essential hypertension 01/27/2009  . HYPERCHOLESTEROLEMIA  IIA 10/21/2008  . Coronary artery disease involving native coronary artery with angina pectoris ([place]) 10/21/2008  . Permanent atrial fibrillation ([place]) 10/21/2008    Orientation RESPIRATION BLADDER Height & Weight     Self,Time,Situation  Normal External catheter Weight: 164 lb 14.5 oz (74.8 kg) Height:  5\' 8"  (172.7 cm)  BEHAVIORAL SYMPTOMS/MOOD NEUROLOGICAL BOWEL NUTRITION STATUS      Continent Diet (See DC Summary)  AMBULATORY STATUS COMMUNICATION OF NEEDS Skin   Limited Assist Verbally                         Personal Care Assistance Level of Assistance  Bathing,Feeding,Dressing Bathing Assistance: Limited assistance Feeding assistance: Independent Dressing Assistance: Limited assistance     Functional Limitations Info  Sight,Hearing,Speech Sight Info: Impaired Hearing Info: Adequate Speech Info: Adequate    SPECIAL CARE FACTORS FREQUENCY  PT (By licensed PT),OT (By licensed OT)     PT Frequency: 5x a week OT Frequency: 5x a week            Contractures Contractures Info: Not present    Additional Factors Info  Code Status,Allergies Code Status Info: Full Allergies Info: NKA           Current Medications (06/16/2020):  This is the current hospital active medication list Current Facility-Administered Medications  Medication Dose Route Frequency Provider Last Rate Last Admin  . aspirin EC tablet 81 mg  81 mg Oral Daily Irene Pap N, DO   81 mg at 06/16/20 6387  . atorvastatin (LIPITOR) tablet 20 mg  20 mg Oral Daily Pahwani, Rinka R, MD   20 mg at 06/16/20 0833  . Chlorhexidine Gluconate Cloth 2 % PADS 6 each  6 each Topical Daily Ermelinda Das, MD   6 each at 06/16/20 0850  . fesoterodine (TOVIAZ) tablet 8 mg  8 mg Oral QPM Hall, Carole N, DO   8 mg at 06/15/20 1723  . finasteride (PROSCAR) tablet 5 mg  5 mg Oral Daily Alexis Frock, MD   5 mg at 06/16/20 6295  . furosemide (LASIX) tablet 80 mg  80 mg Oral Daily Turner, Traci R, MD      . guaiFENesin (MUCINEX) 12 hr tablet 1,200 mg  1,200 mg Oral BID Irene Pap N, DO   1,200 mg at 06/16/20 2841  . hydrALAZINE (APRESOLINE) tablet 10 mg  10 mg Oral Q8H Sueanne Margarita, MD   10 mg at 06/16/20 0615  . HYDROmorphone (DILAUDID) injection 0.5 mg  0.5 mg Intravenous Q4H PRN Pahwani, Rinka R, MD   0.5 mg at 06/13/20 1612  . ipratropium-albuterol (DUONEB) 0.5-2.5 (3) MG/3ML nebulizer solution 3 mL  3 mL Nebulization TID PRN Cristal Ford, DO      . isosorbide mononitrate (IMDUR) 24 hr tablet 120 mg  120 mg Oral Daily Irene Pap N, DO   120 mg at 06/16/20 3244  . LORazepam (ATIVAN) tablet 0.5 mg  0.5 mg Oral QHS PRN Irene Pap N, DO      . losartan (COZAAR) tablet 100 mg  100 mg Oral Daily Turner, Traci R, MD      . metoprolol succinate (TOPROL-XL) 24 hr tablet 25 mg  25 mg Oral Daily Irene Pap N, DO   25 mg at 06/16/20 0102  . nitroGLYCERIN (NITROSTAT) SL tablet 0.4 mg  0.4 mg Sublingual Q5 min PRN Pahwani, Rinka R, MD      . oseltamivir (TAMIFLU) capsule 30 mg  30 mg Oral BID Irene Pap N, DO   30 mg at 06/16/20 0834  . polyethylene glycol (MIRALAX / GLYCOLAX) packet 17 g  17 g Oral Daily PRN Pahwani, Rinka R, MD      . potassium chloride SA (KLOR-CON) CR tablet 40 mEq  40 mEq Oral BID Charlynne Cousins, MD   40 mEq at 06/16/20 0830  . tamsulosin (FLOMAX) capsule 0.4 mg  0.4 mg Oral Daily Alexis Frock, MD   0.4 mg at 06/16/20 7253  . warfarin (COUMADIN) tablet 0.5 mg  0.5 mg Oral ONCE-1600 Karren Cobble, RPH      . warfarin (COUMADIN) tablet 5 mg  5 mg Oral ONCE-1600 [place], [place]      . Warfarin - Pharmacist Dosing Inpatient   Does not apply [place], [facility]         Discharge Medications: Please see discharge summary for a list of discharge medications.  Relevant Imaging Results:  Relevant Lab Results:   Additional  Information SSN# [id number]  Reece Agar, [place]

## 2020-06-16 NOTE — Progress Notes (Signed)
[facility] PROGRESS NOTE    Progress Note  Tyler Williams  UKG:[id number] DOB: 1926/08/08 DOA: 06/11/2020 PCP: Burnard Bunting, MD     Brief Narrative:   Tyler Williams is an 87 y.o. male past medical history significant for permanent atrial fibrillation on Coumadin, coronary artery disease, status post AAA repair, aortic stenosis status post TAVR was in 7628, chronic diastolic heart failure, comes into the ED for worsening productive cough that started 4 days prior to admission associated with lower extremity edema and generalized weakness.  In the ED BNP was 1200 chest x-ray showed bilateral pulmonary infiltrates influenza A was positive COVID-19 PCR was negative INR was 7.7 IV diuresis by the ED and cardiology has been consulted.   Assessment/Plan:   Acute on chronic combined systolic and diastolic heart failure: Likely secondary to noncompliance with his diuretics. Cardiology has been consulted and was placed on IV Lasix. He is now negative about 7 L, his potassium was 3.3 this morning we will go ahead and replete orally recheck in the morning. Further management per cardiology continue strict I's and O's and daily weights. He is responding well to diuresis continue IV diuresis per cardiology.  Influenza A viral pneumonia: Tamiflu and admission for 5 days. He was started empirically on Rocephin his procalcitonin was 0.1 Continue Rocephin.  Not have a bacterial infection on admission.  Elevated troponin/history of coronary artery disease In the setting of demand ischemia. Denies any chest pain.  Acute urinary retention with frank hematuria and UTI Urine culture not obtained on admission was given  Rocephin. Urine was obtained and has remained negative till date.  Paroxysmal atrial fibrillation on Coumadin Coumadin per pharmacy INR is trending down continue Toprol for rate control. INR today is 2.2.  Essential hypertension Blood pressure is slowly  improving, will resume his home medications.  Hypokalemia/hypomagnesemia: Likely due to diuresis replete and recheck.  Gross hematuria: They were unsuccessful in placing Foley urology was consulted, the patient reported frequency urgency and intermittent incontinence. They recommended to continue Foley for 10 days remove Foley as an outpatient in [facility]. Etiology is likely due to Foley trauma placement in the setting of supratherapeutic INR. Will need to be discharged on Flomax and finasteride.   DVT prophylaxis: lovenox Family Communication:none Status is: Inpatient  Remains inpatient appropriate because:Hemodynamically unstable   Dispo: The patient is from: Home              Anticipated d/c is to: Home              Patient currently is not medically stable to d/c.   Difficult to place patient No        Code Status:     Code Status Orders  (From admission, onward)         Start     Ordered   06/12/20 0435  Full code  Continuous        06/12/20 0434        Code Status History    Date Active Date Inactive Code Status Order ID Comments User Context   08/28/2017 0337 09/01/2017 1420 Full Code [id number]  Norval Morton, MD ED   06/13/2016 1646 06/15/2016 2028 Full Code [id number]  Rexene Alberts, MD Inpatient   05/12/2016 1435 05/12/2016 2141 Full Code [id number]  Sherren Mocha, MD Inpatient   12/04/2012 0000 12/07/2012 1544 Full Code [id number]  Orvan Falconer, MD Inpatient   01/22/2012 1522 01/23/2012 1440 Full Code [id number]  Redge Gainer,  RN Inpatient   Advance Care Planning Activity        IV Access:    Peripheral IV   Procedures and diagnostic studies:   DG Chest 2 View  Result Date: 06/15/2020 CLINICAL DATA:  History of congestive failure EXAM: CHEST - 2 VIEW COMPARISON:  06/12/2020 FINDINGS: Cardiac shadow remains enlarged. Changes of prior TAVR are noted. Aortic calcifications are again seen. The lungs are clear. Somewhat ovoid density is noted  over the left apex which may artifactual in nature and extrinsic to the patient. This was not seen on the lateral projection nor on the most recent exam from 3 days previous. No bony abnormality is noted. Small effusions are seen bilaterally. IMPRESSION: Small posterior effusions. Ovoid density overlying the anterior aspect of the left third rib which is felt to be artifactual in nature as it is not seen on the lateral projection and was not present on the film from 3 days previous. Electronically Signed   By: Inez Catalina M.D.   On: 06/15/2020 12:45     Medical Consultants:    None.   Subjective:    Tyler Williams feels great no new complaints.  Objective:    Vitals:   06/15/20 1356 06/15/20 1956 06/15/20 2108 06/16/20 0419  BP: (!) 116/52 (!) 147/68 (!) 154/76 (!) 147/63  Pulse: (!) 57 68 62 (!) 59  Resp: 16 20 16 18   Temp: 98 F (36.7 C) 98.6 F (37 C) 98.3 F (36.8 C) 97.9 F (36.6 C)  TempSrc: Oral Oral Oral Oral  SpO2: 97% 96% 98% 94%  Weight:    74.8 kg  Height:       SpO2: 94 %   Intake/Output Summary (Last 24 hours) at 06/16/2020 0844 Last data filed at 06/16/2020 0300 Gross per 24 hour  Intake 630 ml  Output 2120 ml  Net -1490 ml   Filed Weights   06/14/20 0153 06/15/20 0030 06/16/20 0419  Weight: 77.4 kg 77.8 kg 74.8 kg    Exam: General exam: In no acute distress. Respiratory system: Good air movement and crackles in the right lung base Cardiovascular system: S1 & S2 heard, RRR.  Positive hepatojugular reflux Gastrointestinal system: Abdomen is nondistended, soft and nontender.  Extremities: 3+ edema Skin: No rashes, lesions or ulcers Psychiatry: Judgement and insight appear normal. Mood & affect appropriate.    Data Reviewed:    Labs: Basic Metabolic Panel: Recent Labs  Lab 06/12/20 0354 06/13/20 0231 06/14/20 0420 06/15/20 0403 06/16/20 0436  NA 142 138 138 137 136  K 3.4* 3.6 4.6 4.2 3.3*  CL 107 102 99 100 103  CO2 26 27 29 30  28   GLUCOSE 87 98 108* 104* 94  BUN 20 16 15  24* 22  CREATININE 0.88 0.83 0.90 0.90 0.75  CALCIUM 8.5* 8.3* 8.5* 8.4* 8.1*  MG 2.0 1.8 2.1  --   --   PHOS 3.0 2.6  --   --   --    GFR Estimated Creatinine Clearance: 57 mL/min (by C-G formula based on SCr of 0.75 mg/dL). Liver Function Tests: Recent Labs  Lab 06/12/20 0027  AST 40  ALT 25  ALKPHOS 100  BILITOT 1.1  PROT 5.8*  ALBUMIN 2.6*   No results for input(s): LIPASE, AMYLASE in the last 168 hours. No results for input(s): AMMONIA in the last 168 hours. Coagulation profile Recent Labs  Lab 06/12/20 0451 06/13/20 0231 06/14/20 0420 06/15/20 0403 06/16/20 0436  INR 7.9* 6.3* 3.8* 3.4* 2.2*  COVID-19 Labs  No results for input(s): DDIMER, FERRITIN, LDH, CRP in the last 72 hours.  Lab Results  Component Value Date   [place] NEGATIVE 06/12/2020    CBC: Recent Labs  Lab 06/12/20 0027 06/12/20 0354 06/13/20 0231 06/14/20 0420 06/15/20 0403 06/16/20 0436  WBC 3.2* 3.6* 4.3 7.2  --  4.7  NEUTROABS 2.0  --   --   --   --   --   HGB 14.0 14.7 15.2 14.7 13.0 12.2*  HCT 44.4 45.4 46.4 45.2 39.3 37.2*  MCV 98.2 96.6 94.9 95.4  --  95.4  PLT 100* 103* 103* 107*  --  121*   Cardiac Enzymes: No results for input(s): CKTOTAL, CKMB, CKMBINDEX, TROPONINI in the last 168 hours. BNP (last 3 results) No results for input(s): PROBNP in the last 8760 hours. CBG: No results for input(s): GLUCAP in the last 168 hours. D-Dimer: No results for input(s): DDIMER in the last 72 hours. Hgb A1c: No results for input(s): HGBA1C in the last 72 hours. Lipid Profile: No results for input(s): CHOL, HDL, LDLCALC, TRIG, CHOLHDL, LDLDIRECT in the last 72 hours. Thyroid function studies: No results for input(s): TSH, T4TOTAL, T3FREE, THYROIDAB in the last 72 hours.  Invalid input(s): FREET3 Anemia work up: No results for input(s): VITAMINB12, FOLATE, FERRITIN, TIBC, IRON, RETICCTPCT in the last 72 hours. Sepsis Labs: Recent  Labs  Lab 06/12/20 0354 06/13/20 0231 06/14/20 0420 06/16/20 0436  PROCALCITON <0.10  --   --   --   WBC 3.6* 4.3 7.2 4.7   Microbiology Recent Results (from the past 240 hour(s))  Resp Panel by RT-PCR (Flu A&B, Covid) Nasopharyngeal Swab     Status: Abnormal   Collection Time: 06/12/20 12:27 AM   Specimen: Nasopharyngeal Swab; Nasopharyngeal(NP) swabs in vial transport medium  Result Value Ref Range Status   SARS Coronavirus 2 by RT PCR NEGATIVE NEGATIVE Final    Comment: (NOTE) SARS-CoV-2 target nucleic acids are NOT DETECTED.  The SARS-CoV-2 RNA is generally detectable in upper respiratory specimens during the acute phase of infection. The lowest concentration of SARS-CoV-2 viral copies this assay can detect is 138 copies/mL. A negative result does not preclude SARS-Cov-2 infection and should not be used as the sole basis for treatment or other patient management decisions. A negative result may occur with  improper specimen collection/handling, submission of specimen other than nasopharyngeal swab, presence of viral mutation(s) within the areas targeted by this assay, and inadequate number of viral copies(<138 copies/mL). A negative result must be combined with clinical observations, patient history, and epidemiological information. The expected result is Negative.  Fact Sheet for Patients:  EntrepreneurPulse.com.au  Fact Sheet for Healthcare Providers:  IncredibleEmployment.be  This test is no t yet approved or cleared by the [place] FDA and  has been authorized for detection and/or diagnosis of SARS-CoV-2 by FDA under an Emergency Use Authorization (EUA). This EUA will remain  in effect (meaning this test can be used) for the duration of the COVID-19 declaration under Section 564(b)(1) of the Act, 21 U.S.C.section 360bbb-3(b)(1), unless the authorization is terminated  or revoked sooner.       Influenza A by PCR POSITIVE  (A) NEGATIVE Final   Influenza B by PCR NEGATIVE NEGATIVE Final    Comment: (NOTE) The Xpert Xpress SARS-CoV-2/FLU/RSV plus assay is intended as an aid in the diagnosis of influenza from Nasopharyngeal swab specimens and should not be used as a sole basis for treatment. Nasal washings and aspirates are unacceptable  for Xpert Xpress SARS-CoV-2/FLU/RSV testing.  Fact Sheet for Patients: EntrepreneurPulse.com.au  Fact Sheet for Healthcare Providers: IncredibleEmployment.be  This test is not yet approved or cleared by the [place] FDA and has been authorized for detection and/or diagnosis of SARS-CoV-2 by FDA under an Emergency Use Authorization (EUA). This EUA will remain in effect (meaning this test can be used) for the duration of the COVID-19 declaration under Section 564(b)(1) of the Act, 21 U.S.C. section 360bbb-3(b)(1), unless the authorization is terminated or revoked.  Performed at [facility], [place] [address], [place], [place] [address]   Culture, Urine     Status: None   Collection Time: 06/14/20  9:10 AM   Specimen: Urine, Random  Result Value Ref Range Status   Specimen Description URINE, RANDOM  Final   Special Requests NONE  Final   Culture   Final    NO GROWTH Performed at [facility], [place] [address], [place], [place] [address]    Report Status 06/15/2020 FINAL  Final     Medications:   . aspirin EC  81 mg Oral Daily  . atorvastatin  20 mg Oral Daily  . Chlorhexidine Gluconate Cloth  6 each Topical Daily  . fesoterodine  8 mg Oral QPM  . finasteride  5 mg Oral Daily  . furosemide  40 mg Intravenous BID  . guaiFENesin  1,200 mg Oral BID  . hydrALAZINE  10 mg Oral Q8H  . isosorbide mononitrate  120 mg Oral Daily  . metoprolol succinate  25 mg Oral Daily  . oseltamivir  30 mg Oral BID  . potassium chloride  40 mEq Oral BID  . ramipril  10 mg Oral BID  . tamsulosin  0.4 mg Oral Daily  . warfarin  0.5  mg Oral ONCE-1600  . warfarin  5 mg Oral ONCE-1600  . Warfarin - Pharmacist Dosing Inpatient   Does not apply q1600   Continuous Infusions:    LOS: 4 days   [place] [facility]  06/16/2020, 8:44 AM

## 2020-06-16 NOTE — Progress Notes (Signed)
ANTICOAGULATION CONSULT NOTE - Follow Up Consult  Pharmacy Consult for Coumadin Indication: atrial fibrillation  Allergies  Allergen Reactions  . No Known Allergies Other (See Comments)    NKA    Patient Measurements: Height: 5\' 8"  (172.7 cm) Weight: 74.8 kg (164 lb 14.5 oz) IBW/kg (Calculated) : 68.4  Vital Signs: Temp: 97.9 F (36.6 C) (05/04 0419) Temp Source: Oral (05/04 0419) BP: 147/63 (05/04 0419) Pulse Rate: 59 (05/04 0419)  Labs: Recent Labs    06/14/20 0420 06/15/20 0403 06/16/20 0436  HGB 14.7 13.0 12.2*  HCT 45.2 39.3 37.2*  PLT 107*  --  121*  LABPROT 37.2* 34.2* 24.7*  INR 3.8* 3.4* 2.2*  CREATININE 0.90 0.90 0.75    Estimated Creatinine Clearance: 57 mL/min (by C-G formula based on SCr of 0.75 mg/dL).  Assessment:  Anticoag: Warfarin PTA for afib. Plt 107 (baseline ~120). INR down to 2.2. Hematuria noted (see Urology note). Hgb dropping a lot down to 12.1. Plts 121 low stable. - Home dose: 5mg  daily - last dose 4/29, Admit INR 7.7  Goal of Therapy:  INR 2-3 Monitor platelets by anticoagulation protocol: Yes   Plan:  Coumadin 5mg  po x 1 today to reverse downward trend. Daily INR   Brieana Shimmin S. Al[place] Highland, PharmD, BCPS Clinical Staff Pharmacist [email or web address] Alford Highland, [place] 06/16/2020,8:11 AM

## 2020-06-16 NOTE — Progress Notes (Signed)
Heart Failure Stewardship Pharmacist Progress Note   PCP: Burnard Bunting, MD PCP-Cardiologist: Sherren Mocha, MD    HPI:  87 yo male with a PMH of CAD s/p POBA to RCA, permanent atrial fibrillation on warfarin, chronic diastolic CHF, aortic stenosis s/p TAVR in 2018, AAA s/p repair, chronic venous insufficiency, HTN, and HLD. Presented with worsening semiproductive cough and found to have Influenza A viral PNA. ECHO on 06/12/20 revealed LVEF 45-50%, RV function mildly reduced, mild-moderate MR and stable TAVR. Was markedly volume overloaded with urinary retention now s/p foley catheter placement with initial gross hematuria due to traumatic placement on anticoagulation.   Current HF Medications: Furosemide 80 mg PO daily Metoprolol XL 25 mg daily Losartan 100 mg daily  Prior to admission HF Medications: Furosemide 80 mg PO daily Metoprolol XL 25 mg daily Ramipril 10 mg BID  Pertinent Lab Values: . Serum creatinine 0.75, BUN 22, Potassium 3.3, Sodium 136  Vital Signs: . Weight: 164 lbs (admission weight: 185 lbs) . Blood pressure: 140/60s  . Heart rate: 60s   Medication Assistance / Insurance Benefits Check: Does the patient have prescription insurance?  Yes Type of insurance plan:  Rx [employer]  Does the patient qualify for medication assistance through manufacturers or grants?   Pending . Eligible grants and/or patient assistance programs: Pending . Medication assistance applications in progress: None  . Medication assistance applications approved: None Approved medication assistance renewals will be completed by: Pending  Outpatient Pharmacy:  Prior to admission outpatient pharmacy: [facility] Is the patient willing to use [place] pharmacy at discharge? Yes Is the patient willing to transition their outpatient pharmacy to utilize a [facility] outpatient pharmacy?   Pending    Assessment: 1. Acute on chronic systolic CHF (EF 15-83%), due to ICM. NYHA class  II symptoms. - Near euvolemic on MD exam - change to PO furosemide 80 mg daily - Continue metoprolol XL 25 mg daily - Change Ramipril to Losartan 100 mg daily. Can consider switching to Entresto 49/51 mg BID on 06/17/20 after 36 hr washout of ACE inhibitor. - Can consider adding spironolactone as long as potassium stable - Can consider adding SGLT2i at time of discharge pending clinical course and improvement of urinary retention   Plan: 1) Medication changes recommended at this time: - Continue current medications  2) Patient assistance: [name] copay $47 - [place] copay [phone] - Will discuss with patient if he is able to afford this or if he needs additional patient assistance  3)  Education  - To be completed prior to discharge  Richardine Service, PharmD, BCPS Kerby Nora, PharmD, BCPS Heart Failure Stewardship Pharmacist Phone [phone]

## 2020-06-16 NOTE — Progress Notes (Signed)
Progress Note  Patient Name: Tyler Williams Date of Encounter: 06/16/2020  [facility] Cardiologist: Sherren Mocha, MD   Subjective   Continues to improve.  Denies any SOB or CP  Inpatient Medications    Scheduled Meds: . aspirin EC  81 mg Oral Daily  . atorvastatin  20 mg Oral Daily  . Chlorhexidine Gluconate Cloth  6 each Topical Daily  . fesoterodine  8 mg Oral QPM  . finasteride  5 mg Oral Daily  . furosemide  40 mg Intravenous BID  . guaiFENesin  1,200 mg Oral BID  . hydrALAZINE  10 mg Oral Q8H  . isosorbide mononitrate  120 mg Oral Daily  . metoprolol succinate  25 mg Oral Daily  . oseltamivir  30 mg Oral BID  . potassium chloride  40 mEq Oral BID  . ramipril  10 mg Oral BID  . tamsulosin  0.4 mg Oral Daily  . warfarin  0.5 mg Oral ONCE-1600  . warfarin  5 mg Oral ONCE-1600  . Warfarin - Pharmacist Dosing Inpatient   Does not apply q1600   Continuous Infusions:  PRN Meds: HYDROmorphone (DILAUDID) injection, ipratropium-albuterol, LORazepam, nitroGLYCERIN, polyethylene glycol   Vital Signs    Vitals:   06/15/20 1356 06/15/20 1956 06/15/20 2108 06/16/20 0419  BP: (!) 116/52 (!) 147/68 (!) 154/76 (!) 147/63  Pulse: (!) 57 68 62 (!) 59  Resp: 16 20 16 18   Temp: 98 F (36.7 C) 98.6 F (37 C) 98.3 F (36.8 C) 97.9 F (36.6 C)  TempSrc: Oral Oral Oral Oral  SpO2: 97% 96% 98% 94%  Weight:    74.8 kg  Height:        Intake/Output Summary (Last 24 hours) at 06/16/2020 3419 Last data filed at 06/16/2020 0847 Gross per 24 hour  Intake 890 ml  Output 1845 ml  Net -955 ml   Last 3 Weights 06/16/2020 06/15/2020 06/14/2020  Weight (lbs) 164 lb 14.5 oz 171 lb 8.3 oz 170 lb 10.2 oz  Weight (kg) 74.8 kg 77.8 kg 77.4 kg      Telemetry    Atrial fibrillation with CVR- Personally Reviewed  ECG    No new EKG to review - Personally Reviewed  Physical Exam   GEN: Well nourished, well developed in no acute distress HEENT: Normal NECK: No JVD; No carotid  bruits LYMPHATICS: No lymphadenopathy CARDIAC:irregulary irregular, no murmurs, rubs, gallops RESPIRATORY:  Clear to auscultation without rales, wheezing or rhonchi  ABDOMEN: Soft, non-tender, non-distended MUSCULOSKELETAL:  No edema; No deformity  SKIN: Warm and dry NEUROLOGIC:  Alert and oriented x 3 PSYCHIATRIC:  Normal affect    Labs    High Sensitivity Troponin:   Recent Labs  Lab 06/12/20 0027 06/12/20 0239 06/12/20 0354 06/12/20 0532  TROPONINIHS 103* 117* 105* 111*      Chemistry Recent Labs  Lab 06/12/20 0027 06/12/20 0354 06/14/20 0420 06/15/20 0403 06/16/20 0436  NA 136   < > 138 137 136  K 3.4*   < > 4.6 4.2 3.3*  CL 107   < > 99 100 103  CO2 22   < > 29 30 28   GLUCOSE 163*   < > 108* 104* 94  BUN 21   < > 15 24* 22  CREATININE 0.89   < > 0.90 0.90 0.75  CALCIUM 8.0*   < > 8.5* 8.4* 8.1*  PROT 5.8*  --   --   --   --   ALBUMIN 2.6*  --   --   --   --  AST 40  --   --   --   --   ALT 25  --   --   --   --   ALKPHOS 100  --   --   --   --   BILITOT 1.1  --   --   --   --   GFRNONAA >60   < > >60 >60 >60  ANIONGAP 7   < > 10 7 5    < > = values in this interval not displayed.     Hematology Recent Labs  Lab 06/13/20 0231 06/14/20 0420 06/15/20 0403 06/16/20 0436  WBC 4.3 7.2  --  4.7  RBC 4.89 4.74  --  3.90*  HGB 15.2 14.7 13.0 12.2*  HCT 46.4 45.2 39.3 37.2*  MCV 94.9 95.4  --  95.4  MCH 31.1 31.0  --  31.3  MCHC 32.8 32.5  --  32.8  RDW 15.8* 15.7*  --  15.4  PLT 103* 107*  --  121*    BNP Recent Labs  Lab 06/12/20 0027  BNP 1,215.1*     DDimer No results for input(s): DDIMER in the last 168 hours.   CHA2DS2-VASc Score = 5  This indicates a 7.2% annual risk of stroke. The patient's score is based upon: CHF History: Yes HTN History: Yes Diabetes History: No Stroke History: No Vascular Disease History: Yes Age Score: 2 Gender Score: 0     Radiology    DG Chest 2 View  Result Date: 06/15/2020 CLINICAL DATA:   History of congestive failure EXAM: CHEST - 2 VIEW COMPARISON:  06/12/2020 FINDINGS: Cardiac shadow remains enlarged. Changes of prior TAVR are noted. Aortic calcifications are again seen. The lungs are clear. Somewhat ovoid density is noted over the left apex which may artifactual in nature and extrinsic to the patient. This was not seen on the lateral projection nor on the most recent exam from 3 days previous. No bony abnormality is noted. Small effusions are seen bilaterally. IMPRESSION: Small posterior effusions. Ovoid density overlying the anterior aspect of the left third rib which is felt to be artifactual in nature as it is not seen on the lateral projection and was not present on the film from 3 days previous. Electronically Signed   By: Inez Catalina M.D.   On: 06/15/2020 12:45    Cardiac Studies   2D echo 05/2020 IMPRESSIONS   1. Left ventricular ejection fraction, by estimation, is 45 to 50%. The  left ventricle has mildly decreased function. The left ventricle  demonstrates regional wall motion abnormalities (see scoring  diagram/findings for description). There is mild  concentric left ventricular hypertrophy. Left ventricular diastolic  parameters are consistent with Grade II diastolic dysfunction  (pseudonormalization). Elevated left atrial pressure. There is moderate  hypokinesis of the left ventricular, basal-mid  inferior wall and inferolateral wall.  2. Right ventricular systolic function is mildly reduced. The right  ventricular size is normal. There is mildly elevated pulmonary artery  systolic pressure.  3. Left atrial size was severely dilated.  4. Right atrial size was moderately dilated.  5. The mitral valve is normal in structure. Mild to moderate mitral valve  regurgitation.  6. The aortic valve has been repaired/replaced. Aortic valve  regurgitation is trivial. No aortic stenosis is present. There is a 29 mm  Sapien prosthetic (TAVR) valve present in the  aortic position. Aortic  valve mean gradient measures 6.0 mmHg. Aortic  valve Vmax measures 1.82 m/s.  7.  The inferior vena cava is dilated in size with <50% respiratory  variability, suggesting right atrial pressure of 15 mmHg.   Comparison(s): Prior images unable to be directly viewed, comparison made  by report only. The left ventricular function is worsened. The left  ventricular wall motion abnormality is unchanged.   Patient Profile     87 y.o. male with a PMH of CAD s/p POBA to RCA remotely, permanent atrial fibrillation on coumadin, chronic diastolic CHF, aortic stenosis s/p TAVR in 2018, AAA s/p repair, chronic venous insufficiency, HTN, HLD, who is being seen for the evaluation of abnormal echocardiogram at the request of Dr. Doristine Bosworth  Assessment & Plan    1. Acute on chronic diastolic CHF: patient has known history of diastolic CHF, now with evidence of systolic dysfunction with EF 45-50% on echo this admission. Side by side comparison was not available to compare to previous though on Dr. Theodosia Blender review, likely EF is in the 50-55% range. RWMA were noted to be unchanged. He has had progressive SOB and LE edema recently. BNP elevated to 1200. Suspect poor nutrition status is also contributing to his volume overload given albumin 2.6.  - He was started on IV lasix 20mg  BID  - he put out 2.1L yesterday and was net neg 7.4L - renal function stable with SCr 0.75 and K+ 3.3 today - Cxray yesterday with clear lungs and small basilar pleural effusions - I think he is near euvolemic and will change to his home does of Lasix 80mg  daily - continue Toprol - change ramipril to Losartan and transition to Entresto at discharge after being off ACE I for > 36 hours - Continue strict I&Os and daily weights - Continue to monitor electrolytes and replete as needed to maintain K >4, Mg >2  2. Influenza A with superimposed bacterial PNA: likely contributing to his symptoms of cough and weakness.  CXR showed moderate bibasilar atelectasis/infiltrate. He was started on tamiflu and CTX.  - Continue antibiotics and supportive care per primary team  3. CAD s/p POBA to RCA remotely: no recent anginal complaints. HsTrop 105>111, low flat trend not c/w ACS. EKG non-ischemic.  - Continue aspirin and statin - Continue BBlocker - Continue imdur  4. Permanent atrial fibrillation: rates well controlled this admission. INR has been supratherapeutic 7.7>7.9>6.3>3.8>2.2.  - HR remains controlled on tele - Continue metoprolol for rate control - Continue coumadin per pharmacy with goal INR 2-3  5. Aortic stenosis s/p TAVR in 2018: valve appears stable on echo this admission - Continue routine monitoring.   6. HTN:  - BP borderline controlled at 147/35mmHg this am and elevated throughout the day yesterday -will continue to follow - Continue lasix, imdur, Toprol - stop ACE I and start losartan 100mg  daily and transition to Entresto at discharge - cannot increase BB due to borderline low HR - continue Hydralazine at 10mg  TID for now since changing ACE to ARB to see how BP tolerates  7. Urinary retention: patient with difficulty urinating today. Bladder scan with 500-600cc. RN attempted 2 foley placements but unsuccessful. Urology consulted - catheter placed and now with gross hematuria due to traumatic placement on anticoagulation - Continue management per primary team and urology.   8. HLD:  - Continue atorvastatin    I have spent a total of 35 minutes with patient reviewing 2D echo , telemetry, EKGs, labs and examining patient as well as establishing an assessment and plan that was discussed with the patient.  > 50% of time  was spent in direct patient care.    For questions or updates, please contact [place] Please consult [email or web address] for contact info under        Signed, Fransico Him, MD  06/16/2020, 9:07 AM

## 2020-06-17 DIAGNOSIS — I420 Dilated cardiomyopathy: Secondary | ICD-10-CM | POA: Diagnosis not present

## 2020-06-17 DIAGNOSIS — I5021 Acute systolic (congestive) heart failure: Secondary | ICD-10-CM

## 2020-06-17 DIAGNOSIS — I509 Heart failure, unspecified: Secondary | ICD-10-CM | POA: Diagnosis not present

## 2020-06-17 DIAGNOSIS — J101 Influenza due to other identified influenza virus with other respiratory manifestations: Secondary | ICD-10-CM | POA: Diagnosis not present

## 2020-06-17 DIAGNOSIS — R310 Gross hematuria: Secondary | ICD-10-CM

## 2020-06-17 DIAGNOSIS — I482 Chronic atrial fibrillation, unspecified: Secondary | ICD-10-CM | POA: Diagnosis not present

## 2020-06-17 LAB — MAGNESIUM: Magnesium: 2 mg/dL (ref 1.7–2.4)

## 2020-06-17 LAB — PROTIME-INR
INR: 1.8 — ABNORMAL HIGH (ref 0.8–1.2)
Prothrombin Time: 20.4 seconds — ABNORMAL HIGH (ref 11.4–15.2)

## 2020-06-17 MED ORDER — POTASSIUM CHLORIDE CRYS ER 20 MEQ PO TBCR
40.0000 meq | EXTENDED_RELEASE_TABLET | Freq: Two times a day (BID) | ORAL | Status: AC
  Administered 2020-06-17 (×2): 40 meq via ORAL
  Filled 2020-06-17 (×2): qty 2

## 2020-06-17 MED ORDER — SACUBITRIL-VALSARTAN 24-26 MG PO TABS
1.0000 | ORAL_TABLET | Freq: Two times a day (BID) | ORAL | Status: DC
  Administered 2020-06-17 – 2020-06-21 (×9): 1 via ORAL
  Filled 2020-06-17 (×9): qty 1

## 2020-06-17 MED ORDER — POLYETHYLENE GLYCOL 3350 17 G PO PACK
17.0000 g | PACK | Freq: Two times a day (BID) | ORAL | Status: DC
  Administered 2020-06-17 – 2020-06-21 (×8): 17 g via ORAL
  Filled 2020-06-17 (×8): qty 1

## 2020-06-17 NOTE — Progress Notes (Signed)
Heart Failure Stewardship Pharmacist Progress Note   PCP: Burnard Bunting, MD PCP-Cardiologist: Sherren Mocha, MD    HPI:  87 yo male with a PMH of CAD s/p POBA to RCA, permanent atrial fibrillation on warfarin, chronic diastolic CHF, aortic stenosis s/p TAVR in 2018, AAA s/p repair, chronic venous insufficiency, HTN, and HLD. Presented with worsening semiproductive cough and found to have Influenza A viral PNA. ECHO on 06/12/20 revealed LVEF 45-50%, RV function mildly reduced, mild-moderate MR and stable TAVR. Was markedly volume overloaded with urinary retention now s/p foley catheter placement with initial gross hematuria due to traumatic placement on anticoagulation.   Current HF Medications: Furosemide 80 mg PO daily Metoprolol XL 25 mg daily Entresto 24/26 mg BID Hydralazine 10 mg q8h Imdur 120 mg daily  Prior to admission HF Medications: Furosemide 80 mg PO daily Metoprolol XL 25 mg daily Ramipril 10 mg BID  Pertinent Lab Values: . 5/4 - Serum creatinine 0.75, BUN 22, Potassium 3.3, Sodium 136  Vital Signs: . Weight: 167 lbs (admission weight: 185 lbs) . Blood pressure: 150/60s  . Heart rate: 60s   Medication Assistance / Insurance Benefits Check: Does the patient have prescription insurance?  Yes Type of insurance plan:  Rx [employer]  Outpatient Pharmacy:  Prior to admission outpatient pharmacy: [facility] Is the patient willing to use [place] pharmacy at discharge? Yes Is the patient willing to transition their outpatient pharmacy to utilize a [facility] outpatient pharmacy?   Pending    Assessment: 1. Acute on chronic systolic CHF (EF 16-10%), due to ICM. NYHA class II symptoms. - Continue furosemide PO 80 mg daily - Continue metoprolol XL 25 mg daily - Agree with transitioning to Entresto 24/26 mg BID - Can consider adding spironolactone as long as potassium stable - Can consider adding SGLT2i at time of discharge pending clinical course and  improvement of urinary retention - Continue hydralazine 10 mg q8h - Continue Imdur 120 mg daily   Plan: 1) Medication changes recommended at this time: - Continue current regimen - agree with changes from above  2) Patient assistance: - Entresto copay $47 - pt states this is affordable - Jardiance copay $47  3)  Education  - To be completed prior to discharge  Kerby Nora, PharmD, BCPS Heart Failure Cytogeneticist Phone [phone]

## 2020-06-17 NOTE — Progress Notes (Addendum)
ANTICOAGULATION CONSULT NOTE - Follow Up Consult  Pharmacy Consult for Coumadin Indication: atrial fibrillation  Allergies  Allergen Reactions  . No Known Allergies Other (See Comments)    NKA    Patient Measurements: Height: 5\' 8"  (172.7 cm) Weight: 76 kg (167 lb 8.8 oz) IBW/kg (Calculated) : 68.4  Vital Signs: Temp: 98.2 F (36.8 C) (05/05 0428) Temp Source: Oral (05/05 0428) BP: 157/67 (05/05 0555) Pulse Rate: 57 (05/05 0555)  Labs: Recent Labs    06/15/20 0403 06/16/20 0436 06/17/20 0301  HGB 13.0 12.2*  --   HCT 39.3 37.2*  --   PLT  --  121*  --   LABPROT 34.2* 24.7* 20.4*  INR 3.4* 2.2* 1.8*  CREATININE 0.90 0.75  --     Estimated Creatinine Clearance: 57 mL/min (by C-G formula based on SCr of 0.75 mg/dL).  Assessment: 87 yo M on warfarin PTA for afib. Admit INR 7.7 on 5mg  daily, down to 3.8 after holding x2 days. Pharmacy consulted to dose warfarin.   Patient with traumatic foley insertion and continued hematuria (frank blood per MD). H/H down trending 14 to 12, plt low but stable. Per discussion with MDs, will hold warfarin until hematuria resolves.   Home dose: 5mg  daily   Goal of Therapy:  INR 2-3  when hematuria resolves Monitor platelets by anticoagulation protocol: Yes   Plan:  Hold warfarin until hematuria resolves Monitor daily INR, CBC/plt Monitor for signs/symptoms of bleeding/hematuria F/u urology recs   Benetta Spar, PharmD, BCPS, [facility] Clinical Pharmacist  Please check AMION for all [place] phone numbers After 10:00 PM, call [place] [phone]

## 2020-06-17 NOTE — Progress Notes (Addendum)
Progress Note  Patient Name: Tyler Williams Date of Encounter: 06/17/2020  [facility] Cardiologist: Sherren Mocha, MD   Subjective   Doing well.  No chest pain or SOB.  Still having significant hematuria  Inpatient Medications    Scheduled Meds: . aspirin EC  81 mg Oral Daily  . atorvastatin  20 mg Oral Daily  . Chlorhexidine Gluconate Cloth  6 each Topical Daily  . fesoterodine  8 mg Oral QPM  . finasteride  5 mg Oral Daily  . furosemide  80 mg Oral Daily  . guaiFENesin  1,200 mg Oral BID  . hydrALAZINE  10 mg Oral Q8H  . isosorbide mononitrate  120 mg Oral Daily  . losartan  100 mg Oral Daily  . metoprolol succinate  25 mg Oral Daily  . tamsulosin  0.4 mg Oral Daily  . Warfarin - Pharmacist Dosing Inpatient   Does not apply q1600   Continuous Infusions:  PRN Meds: HYDROmorphone (DILAUDID) injection, ipratropium-albuterol, LORazepam, nitroGLYCERIN, polyethylene glycol   Vital Signs    Vitals:   06/16/20 1500 06/16/20 1946 06/17/20 0428 06/17/20 0555  BP: (!) 126/53 (!) 135/56 (!) 155/74 (!) 157/67  Pulse:  (!) 55 (!) 59 (!) 57  Resp:  18 20   Temp:  98.2 F (36.8 C) 98.2 F (36.8 C)   TempSrc:  Oral Oral   SpO2:  98% 96%   Weight:   76 kg   Height:        Intake/Output Summary (Last 24 hours) at 06/17/2020 0737 Last data filed at 06/17/2020 0434 Gross per 24 hour  Intake 1490 ml  Output 2895 ml  Net -1405 ml   Last 3 Weights 06/17/2020 06/16/2020 06/15/2020  Weight (lbs) 167 lb 8.8 oz 164 lb 14.5 oz 171 lb 8.3 oz  Weight (kg) 76 kg 74.8 kg 77.8 kg      Telemetry    Atrial fibrillation with CVR- Personally Reviewed  ECG    No new EKG to review - Personally Reviewed  Physical Exam   GEN: Well nourished, well developed in no acute distress HEENT: Normal NECK: No JVD; No carotid bruits LYMPHATICS: No lymphadenopathy CARDIAC:irregularly irregular, no murmurs, rubs, gallops RESPIRATORY:scattered wheezes and rhonchi ABDOMEN: Soft, non-tender,  non-distended MUSCULOSKELETAL:  No edema; No deformity  SKIN: Warm and dry NEUROLOGIC:  Alert and oriented x 3 PSYCHIATRIC:  Normal affect    Labs    High Sensitivity Troponin:   Recent Labs  Lab 06/12/20 0027 06/12/20 0239 06/12/20 0354 06/12/20 0532  TROPONINIHS 103* 117* 105* 111*      Chemistry Recent Labs  Lab 06/12/20 0027 06/12/20 0354 06/14/20 0420 06/15/20 0403 06/16/20 0436  NA 136   < > 138 137 136  K 3.4*   < > 4.6 4.2 3.3*  CL 107   < > 99 100 103  CO2 22   < > 29 30 28   GLUCOSE 163*   < > 108* 104* 94  BUN 21   < > 15 24* 22  CREATININE 0.89   < > 0.90 0.90 0.75  CALCIUM 8.0*   < > 8.5* 8.4* 8.1*  PROT 5.8*  --   --   --   --   ALBUMIN 2.6*  --   --   --   --   AST 40  --   --   --   --   ALT 25  --   --   --   --  ALKPHOS 100  --   --   --   --   BILITOT 1.1  --   --   --   --   GFRNONAA >60   < > >60 >60 >60  ANIONGAP 7   < > 10 7 5    < > = values in this interval not displayed.     Hematology Recent Labs  Lab 06/13/20 0231 06/14/20 0420 06/15/20 0403 06/16/20 0436  WBC 4.3 7.2  --  4.7  RBC 4.89 4.74  --  3.90*  HGB 15.2 14.7 13.0 12.2*  HCT 46.4 45.2 39.3 37.2*  MCV 94.9 95.4  --  95.4  MCH 31.1 31.0  --  31.3  MCHC 32.8 32.5  --  32.8  RDW 15.8* 15.7*  --  15.4  PLT 103* 107*  --  121*    BNP Recent Labs  Lab 06/12/20 0027  BNP 1,215.1*     DDimer No results for input(s): DDIMER in the last 168 hours.   CHA2DS2-VASc Score = 5  This indicates a 7.2% annual risk of stroke. The patient's score is based upon: CHF History: Yes HTN History: Yes Diabetes History: No Stroke History: No Vascular Disease History: Yes Age Score: 2 Gender Score: 0     Radiology    DG Chest 2 View  Result Date: 06/15/2020 CLINICAL DATA:  History of congestive failure EXAM: CHEST - 2 VIEW COMPARISON:  06/12/2020 FINDINGS: Cardiac shadow remains enlarged. Changes of prior TAVR are noted. Aortic calcifications are again seen. The lungs are  clear. Somewhat ovoid density is noted over the left apex which may artifactual in nature and extrinsic to the patient. This was not seen on the lateral projection nor on the most recent exam from 3 days previous. No bony abnormality is noted. Small effusions are seen bilaterally. IMPRESSION: Small posterior effusions. Ovoid density overlying the anterior aspect of the left third rib which is felt to be artifactual in nature as it is not seen on the lateral projection and was not present on the film from 3 days previous. Electronically Signed   By: Inez Catalina M.D.   On: 06/15/2020 12:45    Cardiac Studies   2D echo 05/2020 IMPRESSIONS   1. Left ventricular ejection fraction, by estimation, is 45 to 50%. The  left ventricle has mildly decreased function. The left ventricle  demonstrates regional wall motion abnormalities (see scoring  diagram/findings for description). There is mild  concentric left ventricular hypertrophy. Left ventricular diastolic  parameters are consistent with Grade II diastolic dysfunction  (pseudonormalization). Elevated left atrial pressure. There is moderate  hypokinesis of the left ventricular, basal-mid  inferior wall and inferolateral wall.  2. Right ventricular systolic function is mildly reduced. The right  ventricular size is normal. There is mildly elevated pulmonary artery  systolic pressure.  3. Left atrial size was severely dilated.  4. Right atrial size was moderately dilated.  5. The mitral valve is normal in structure. Mild to moderate mitral valve  regurgitation.  6. The aortic valve has been repaired/replaced. Aortic valve  regurgitation is trivial. No aortic stenosis is present. There is a 29 mm  Sapien prosthetic (TAVR) valve present in the aortic position. Aortic  valve mean gradient measures 6.0 mmHg. Aortic  valve Vmax measures 1.82 m/s.  7. The inferior vena cava is dilated in size with <50% respiratory  variability, suggesting right  atrial pressure of 15 mmHg.   Comparison(s): Prior images unable to be directly viewed,  comparison made  by report only. The left ventricular function is worsened. The left  ventricular wall motion abnormality is unchanged.   Patient Profile     87 y.o. male with a PMH of CAD s/p POBA to RCA remotely, permanent atrial fibrillation on coumadin, chronic diastolic CHF, aortic stenosis s/p TAVR in 2018, AAA s/p repair, chronic venous insufficiency, HTN, HLD, who is being seen for the evaluation of abnormal echocardiogram at the request of Dr. Doristine Bosworth  Assessment & Plan    1. Acute on chronic diastolic CHF: patient has known history of diastolic CHF, now with evidence of systolic dysfunction with EF 45-50% on echo this admission. Side by side comparison was not available to compare to previous though on Dr. Theodosia Blender review, likely EF is in the 50-55% range. RWMA were noted to be unchanged. He has had progressive SOB and LE edema recently. BNP elevated to 1200. Suspect poor nutrition status is also contributing to his volume overload given albumin 2.6.  - He was started on IV lasix 20mg  BID  - he put out 2.89L yesterday and is net neg 9.2L since admit - BMET pending this am - Cxray 5/2 with clear lungs and small basilar pleural effusions - changed back to Lasix 80mg  daily which we will continue - continue Toprol - changed ramipril to Losartan and transition to Entresto at discharge after being off ACE I for > 36 hours] - he will be 36 hours from last dose of ACE I at 10am today so will change Losartan to Entresto 24-26mg  BID starting at 10am today  and titrate as BP allows - Continue strict I&Os and daily weights - Continue to monitor electrolytes and replete as needed to maintain K >4, Mg >2  2. Influenza A with superimposed bacterial PNA: likely contributing to his symptoms of cough and weakness. CXR showed moderate bibasilar atelectasis/infiltrate. He was started on tamiflu and CTX.  -  Continue antibiotics and supportive care per primary team  3. CAD s/p POBA to RCA remotely: no recent anginal complaints. HsTrop 105>111, low flat trend not c/w ACS. EKG non-ischemic.  - Continue aspirin and statin - Continue BBlocker - Continue imdur  4. Permanent atrial fibrillation: rates well controlled this admission. INR has been supratherapeutic 7.7>7.9>6.3>3.8>2.2>1.8.  - HR remains controlled on tele - Continue metoprolol for rate control - Continue coumadin per pharmacy with goal INR 2-3 - see below>>having significant hematuria still and need guidance from Urology on whether we can continue anticoagulation  5. Aortic stenosis s/p TAVR in 2018: valve appears stable on echo this admission - Continue routine monitoring.   6. HTN:  - BP borderline controlled at 157/30mmHg this am but better controlled during the day yesterday  -will continue to follow - Continue lasix, imdur, Toprol - stopped ACE I and started losartan 100mg  daily with plans to transition to [facility] at discharge - it has been 36 hours since last dose of ACE I so will change Losartan to Entresto 24-26mg  BID today - cannot increase BB due to borderline low HR - continue Hydralazine at 10mg  TID for now since changing ACE to ARB to see how BP tolerates  7. Urinary retention: patient with difficulty urinating today. Bladder scan with 500-600cc. RN attempted 2 foley placements but unsuccessful. Urology consulted - catheter placed and now with gross hematuria due to traumatic placement on anticoagulation - Continue management per primary team and urology.   8. HLD:  - Continue atorvastatin  9.  Hematuria -this has not improved -  initially thought to be related to traumatic foley insertion in setting of anticoagulation but has not improved -recommend Urology to see back today for recommendations since he is on anticoagulation for afib    I have spent a total of 35 minutes with patient reviewing 2D echo ,  telemetry, EKGs, labs and examining patient as well as establishing an assessment and plan that was discussed with the patient.  > 50% of time was spent in direct patient care.    For questions or updates, please contact [place] Please consult [email or web address] for contact info under        Signed, Fransico Him, MD  06/17/2020, 7:37 AM

## 2020-06-17 NOTE — Progress Notes (Signed)
[facility] PROGRESS NOTE    Progress Note  Tyler Williams  [id number] DOB: 11-Jun-1926 DOA: 06/11/2020 PCP: Burnard Bunting, MD     Brief Narrative:   Tyler Williams is an 87 y.o. male past medical history significant for permanent atrial fibrillation on Coumadin, coronary artery disease, status post AAA repair, aortic stenosis status post TAVR was in [id number], chronic diastolic heart failure, comes into the ED for worsening productive cough that started 4 days prior to admission associated with lower extremity edema and generalized weakness.  In the ED BNP was 1200 chest x-ray showed bilateral pulmonary infiltrates influenza A was positive COVID-19 PCR was negative INR was 7.7 IV diuresis by the ED and cardiology has been consulted.   Assessment/Plan:   Acute on chronic combined systolic and diastolic heart failure: Likely secondary to noncompliance with diuretic. Cardiology was Started he was started on IV Lasix he is diuresed about 9 L. Potassium is low this morning replete and recheck in the morning. Continue IV diuresis per cardiology appreciate assistance. Strict I's and O's and daily weights. Cards to start [facility] today monitor Bp.  Influenza A viral pneumonia: Tamiflu and admission for 5 days. He was started empirically on Rocephin his procalcitonin was 0.1 Discontinue  Rocephin.  Not have a bacterial infection on admission.  Elevated troponin/history of coronary artery disease In the setting of demand ischemia. Denies any chest pain.  Acute urinary retention with frank hematuria and UTI Urine culture not obtained on admission was given  Rocephin.  Paroxysmal atrial fibrillation on Coumadin There with gross hematuria mild drop in hemoglobin to 12, hold Coumadin allow INR to drift down until hematuria has been cleared. Continue Toprol for rate control.  Essential hypertension Blood pressure is slowly improving, will resume his home  medications.  Hypokalemia/hypomagnesemia: Likely due to diuresis replete and recheck.  Gross hematuria: They were unsuccessful in placing Foley urology was consulted, the patient reported frequency urgency and intermittent incontinence. Traumatic insertion still with gross hematuria INR subtherapeutic hold Coumadin continue to monitor hemoglobin and urine output of Coumadin until hematuria subsides. He will need to continue Foley for 10 days and follow[facility] as [facility].  DVT prophylaxis: Coumadin Family Communication:none Status is: Inpatient  Remains inpatient appropriate because:Hemodynamically unstable   Dispo: The patient is from: Home              Anticipated d/c is to: Home              Patient currently is not medically stable to d/c.   Difficult to place patient No        Code Status:     Code Status Orders  (From admission, onward)         Start     Ordered   06/12/20 0435  Full code  Continuous        06/12/20 0434        Code Status History    Date Active Date Inactive Code Status Order ID Comments User Context   08/28/2017 0337 09/01/2017 1420 Full Code [id number]  Norval Morton, MD ED   06/13/2016 1646 06/15/2016 2028 Full Code [id number]  Rexene Alberts, MD Inpatient   05/12/2016 1435 05/12/2016 2141 Full Code [id number]  Sherren Mocha, MD Inpatient   12/04/2012 0000 12/07/2012 1544 Full Code [id number]  Orvan Falconer, MD Inpatient   01/22/2012 1522 01/23/2012 1440 Full Code [id number]  Redge Gainer, RN Inpatient   Advance Care Planning Activity  IV Access:    Peripheral IV   Procedures and diagnostic studies:   DG Chest 2 View  Result Date: 06/15/2020 CLINICAL DATA:  History of congestive failure EXAM: CHEST - 2 VIEW COMPARISON:  06/12/2020 FINDINGS: Cardiac shadow remains enlarged. Changes of prior TAVR are noted. Aortic calcifications are again seen. The lungs are clear. Somewhat ovoid density is noted over the left  apex which may artifactual in nature and extrinsic to the patient. This was not seen on the lateral projection nor on the most recent exam from 3 days previous. No bony abnormality is noted. Small effusions are seen bilaterally. IMPRESSION: Small posterior effusions. Ovoid density overlying the anterior aspect of the left third rib which is felt to be artifactual in nature as it is not seen on the lateral projection and was not present on the film from 3 days previous. Electronically Signed   By: Inez Catalina M.D.   On: 06/15/2020 12:45     Medical Consultants:    None.   Subjective:    Tyler Williams has no new complaints today he relates he is ready to go home.  Objective:    Vitals:   06/16/20 1500 06/16/20 1946 06/17/20 0428 06/17/20 0555  BP: (!) 126/53 (!) 135/56 (!) 155/74 (!) 157/67  Pulse:  (!) 55 (!) 59 (!) 57  Resp:  18 20   Temp:  98.2 F (36.8 C) 98.2 F (36.8 C)   TempSrc:  Oral Oral   SpO2:  98% 96%   Weight:   76 kg   Height:       SpO2: 96 %   Intake/Output Summary (Last 24 hours) at 06/17/2020 0800 Last data filed at 06/17/2020 0434 Gross per 24 hour  Intake 1490 ml  Output 2895 ml  Net -1405 ml   Filed Weights   06/15/20 0030 06/16/20 0419 06/17/20 0428  Weight: 77.8 kg 74.8 kg 76 kg    Exam: General exam: In no acute distress. Respiratory system: Good air movement and clear to auscultation. Cardiovascular system: S1 & S2 heard, RRR. +JVD. Gastrointestinal system: Abdomen is nondistended, soft and nontender.  Extremities: No pedal edema. Skin: No rashes, lesions or ulcers Psychiatry: Judgement and insight appear normal. Mood & affect appropriate.  Data Reviewed:    Labs: Basic Metabolic Panel: Recent Labs  Lab 06/12/20 0354 06/13/20 0231 06/14/20 0420 06/15/20 0403 06/16/20 0436  NA 142 138 138 137 136  K 3.4* 3.6 4.6 4.2 3.3*  CL 107 102 99 100 103  CO2 26 27 29 30 28   GLUCOSE 87 98 108* 104* 94  BUN 20 16 15  24* 22   CREATININE 0.88 0.83 0.90 0.90 0.75  CALCIUM 8.5* 8.3* 8.5* 8.4* 8.1*  MG 2.0 1.8 2.1  --   --   PHOS 3.0 2.6  --   --   --    GFR Estimated Creatinine Clearance: 57 mL/min (by C-G formula based on SCr of 0.75 mg/dL). Liver Function Tests: Recent Labs  Lab 06/12/20 0027  AST 40  ALT 25  ALKPHOS 100  BILITOT 1.1  PROT 5.8*  ALBUMIN 2.6*   No results for input(s): LIPASE, AMYLASE in the last 168 hours. No results for input(s): AMMONIA in the last 168 hours. Coagulation profile Recent Labs  Lab 06/13/20 0231 06/14/20 0420 06/15/20 0403 06/16/20 0436 06/17/20 0301  INR 6.3* 3.8* 3.4* 2.2* 1.8*   COVID-19 Labs  No results for input(s): DDIMER, FERRITIN, LDH, CRP in the last 72 hours.  Lab Results  Component Value Date   [place] NEGATIVE 06/12/2020    CBC: Recent Labs  Lab 06/12/20 0027 06/12/20 0354 06/13/20 0231 06/14/20 0420 06/15/20 0403 06/16/20 0436  WBC 3.2* 3.6* 4.3 7.2  --  4.7  NEUTROABS 2.0  --   --   --   --   --   HGB 14.0 14.7 15.2 14.7 13.0 12.2*  HCT 44.4 45.4 46.4 45.2 39.3 37.2*  MCV 98.2 96.6 94.9 95.4  --  95.4  PLT 100* 103* 103* 107*  --  121*   Cardiac Enzymes: No results for input(s): CKTOTAL, CKMB, CKMBINDEX, TROPONINI in the last 168 hours. BNP (last 3 results) No results for input(s): PROBNP in the last 8760 hours. CBG: No results for input(s): GLUCAP in the last 168 hours. D-Dimer: No results for input(s): DDIMER in the last 72 hours. Hgb A1c: No results for input(s): HGBA1C in the last 72 hours. Lipid Profile: No results for input(s): CHOL, HDL, LDLCALC, TRIG, CHOLHDL, LDLDIRECT in the last 72 hours. Thyroid function studies: No results for input(s): TSH, T4TOTAL, T3FREE, THYROIDAB in the last 72 hours.  Invalid input(s): FREET3 Anemia work up: No results for input(s): VITAMINB12, FOLATE, FERRITIN, TIBC, IRON, RETICCTPCT in the last 72 hours. Sepsis Labs: Recent Labs  Lab 06/12/20 0354 06/13/20 0231  06/14/20 0420 06/16/20 0436  PROCALCITON <0.10  --   --   --   WBC 3.6* 4.3 7.2 4.7   Microbiology Recent Results (from the past 240 hour(s))  Resp Panel by RT-PCR (Flu A&B, Covid) Nasopharyngeal Swab     Status: Abnormal   Collection Time: 06/12/20 12:27 AM   Specimen: Nasopharyngeal Swab; Nasopharyngeal(NP) swabs in vial transport medium  Result Value Ref Range Status   SARS Coronavirus 2 by RT PCR NEGATIVE NEGATIVE Final    Comment: (NOTE) SARS-CoV-2 target nucleic acids are NOT DETECTED.  The SARS-CoV-2 RNA is generally detectable in upper respiratory specimens during the acute phase of infection. The lowest concentration of SARS-CoV-2 viral copies this assay can detect is 138 copies/mL. A negative result does not preclude SARS-Cov-2 infection and should not be used as the sole basis for treatment or other patient management decisions. A negative result may occur with  improper specimen collection/handling, submission of specimen other than nasopharyngeal swab, presence of viral mutation(s) within the areas targeted by this assay, and inadequate number of viral copies(<138 copies/mL). A negative result must be combined with clinical observations, patient history, and epidemiological information. The expected result is Negative.  Fact Sheet for Patients:  EntrepreneurPulse.com.au  Fact Sheet for Healthcare Providers:  IncredibleEmployment.be  This test is no t yet approved or cleared by the [place] FDA and  has been authorized for detection and/or diagnosis of SARS-CoV-2 by FDA under an Emergency Use Authorization (EUA). This EUA will remain  in effect (meaning this test can be used) for the duration of the COVID-19 declaration under Section 564(b)(1) of the Act, 21 U.S.C.section 360bbb-3(b)(1), unless the authorization is terminated  or revoked sooner.       Influenza A by PCR POSITIVE (A) NEGATIVE Final   Influenza B by PCR  NEGATIVE NEGATIVE Final    Comment: (NOTE) The Xpert Xpress SARS-CoV-2/FLU/RSV plus assay is intended as an aid in the diagnosis of influenza from Nasopharyngeal swab specimens and should not be used as a sole basis for treatment. Nasal washings and aspirates are unacceptable for Xpert Xpress SARS-CoV-2/FLU/RSV testing.  Fact Sheet for Patients: EntrepreneurPulse.com.au  Fact Sheet for Healthcare Providers:  IncredibleEmployment.be  This test is not yet approved or cleared by the [place] and has been authorized for detection and/or diagnosis of SARS-CoV-2 by FDA under an Emergency Use Authorization (EUA). This EUA will remain in effect (meaning this test can be used) for the duration of the COVID-19 declaration under Section 564(b)(1) of the Act, 21 U.S.C. section 360bbb-3(b)(1), unless the authorization is terminated or revoked.  Performed at [facility], [place] [address] [place], [place] [address]   Culture, Urine     Status: None   Collection Time: 06/14/20  9:10 AM   Specimen: Urine, Random  Result Value Ref Range Status   Specimen Description URINE, RANDOM  Final   Special Requests NONE  Final   Culture   Final    NO GROWTH Performed at [facility], [place] [address] [place], [place] [address]    Report Status 06/15/2020 FINAL  Final     Medications:   . aspirin EC  81 mg Oral Daily  . atorvastatin  20 mg Oral Daily  . Chlorhexidine Gluconate Cloth  6 each Topical Daily  . fesoterodine  8 mg Oral QPM  . finasteride  5 mg Oral Daily  . furosemide  80 mg Oral Daily  . guaiFENesin  1,200 mg Oral BID  . hydrALAZINE  10 mg Oral Q8H  . isosorbide mononitrate  120 mg Oral Daily  . metoprolol succinate  25 mg Oral Daily  . sacubitril-valsartan  1 tablet Oral BID  . tamsulosin  0.4 mg Oral Daily  . Warfarin - Pharmacist Dosing Inpatient   Does not apply q1600   Continuous Infusions:    LOS: 5 days   [facility]  06/17/2020, 8:00 AM

## 2020-06-17 NOTE — TOC Initial Note (Signed)
Transition of Care [facility]) - Initial/Assessment Note    Patient Details  Name: Tyler Williams MRN: [id number] Date of Birth: 15-May-1926  Transition of Care [facility]) CM/SW Contact:    Bartholomew Crews, RN Phone Number: [phone] 06/17/2020, 4:02 PM  Clinical Narrative:                  Spoke with patient and daughter, Tyler Williams, at the bedside. PTA patient living at [facility]. Stated that he had been living at home alone until about 2 weeks prior to admission to hospital. He has a walker at home.   Discussed HH PT recommendations. Advised that [place] has Building surveyor for home health PT. Agreeable. Spoke with Rollene Fare at [facility] to advise of recommendations. Patient will need HH PT order with [facility] at discharge.   Family to provide transportation back to [place] at discharge.   TOC following for transition needs.   Expected Discharge Plan: [place] Barriers to Discharge: Continued Medical Work up   Patient Goals and CMS Choice Patient states their goals for this hospitalization and ongoing recovery are:: return to [place] Medicare.gov Compare Post Acute Care list provided to:: Patient Choice offered to / list presented to : Patient  Expected Discharge Plan and Services Expected Discharge Plan: [place] In-house Referral: Clinical Social Work Discharge Planning Services: CM Consult Post Acute Care Choice: [place] arrangements for the past 2 months: [place]                 DME Arranged: N/A DME Agency: NA       HH Arranged: PT [place] Agency: Other - See comment Secondary school teacher) Date HH Agency Contacted: 06/17/20 Time [place]: 1559 Representative spoke with at [place]: Rollene Fare  Prior Living Arrangements/Services Living arrangements for the past 2 months: [place] Lives with:: Facility Resident Patient  language and need for interpreter reviewed:: Yes Do you feel safe going back to the place where you live?: Yes      Need for Family Participation in Patient Care: Yes (Comment) Care giver support system in place?: Yes (comment)   Criminal Activity/Legal Involvement Pertinent to Current Situation/Hospitalization: No - Comment as needed  Activities of Daily Living      Permission Sought/Granted Permission sought to share information with : Family Supports Permission granted to share information with : Yes, Verbal Permission Granted  Share Information with NAME: Tyler Williams     Permission granted to share info w Relationship: daughter     Emotional Assessment Appearance:: Appears stated age Attitude/Demeanor/Rapport: Engaged Affect (typically observed): Accepting Orientation: : Fluctuating Orientation (Suspected and/or reported Sundowners) Alcohol / Substance Use: Not Applicable Psych Involvement: No (comment)  Admission diagnosis:  Acute CHF (congestive heart failure) ([place]) [I50.9] Patient Active Problem List   Diagnosis Date Noted  . Gross hematuria   . DCM (dilated cardiomyopathy) ([place])   . Chronic atrial fibrillation ([place])   . Acute CHF (congestive heart failure) ([place]) 06/12/2020  . Aorta aneurysm ([place]) 02/14/2018  . Chronic diastolic CHF (congestive heart failure) ([place]) 09/25/2017  . Precordial chest pain 08/28/2017  . HLD (hyperlipidemia) 08/28/2017  . Anticoagulated on Coumadin 08/28/2017  . HTN (hypertension) 08/28/2017  . S/P TAVR (transcatheter aortic valve replacement)   . 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 Incidental cecal mass noted on CT imaging 05/24/2016  . Exudative age-related macular degeneration of both eyes with  active choroidal neovascularization ([place]) 12/22/2014  . Encounter for therapeutic drug monitoring 03/20/2013  . Pseudophakia of both eyes 03/19/2013  . Macular degeneration 03/19/2013  . Cholecystitis, acute 12/06/2012   . Chest pain, atypical 12/03/2012  . Hematuria, microscopic 12/03/2012  . Cystoid macular edema 05/14/2012  . Overweight (BMI 25.0-29.9) 01/23/2012  . Hyponatremia 01/23/2012  . S/P right UKR 01/22/2012  . Carotid artery bruit 11/21/2011  . Carotid bruit 11/21/2011  . Presence of intraocular lens 02/16/2011  . EDEMA 07/14/2009  . Essential hypertension 01/27/2009  . HYPERCHOLESTEROLEMIA  IIA 10/21/2008  . Coronary artery disease involving native coronary artery with angina pectoris ([place]) 10/21/2008  . Permanent atrial fibrillation ([place]) 10/21/2008   PCP:  Burnard Bunting, MD Pharmacy:   [facility], [place] [address] Phone: [phone] Fax: [facility] [address] [address] Phone: [phone] Fax: [phone]     Social Determinants of Health (SDOH) Interventions    Readmission Risk Interventions No flowsheet data found.

## 2020-06-18 DIAGNOSIS — R310 Gross hematuria: Secondary | ICD-10-CM | POA: Diagnosis not present

## 2020-06-18 DIAGNOSIS — I482 Chronic atrial fibrillation, unspecified: Secondary | ICD-10-CM | POA: Diagnosis not present

## 2020-06-18 DIAGNOSIS — I420 Dilated cardiomyopathy: Secondary | ICD-10-CM | POA: Diagnosis not present

## 2020-06-18 DIAGNOSIS — I5023 Acute on chronic systolic (congestive) heart failure: Secondary | ICD-10-CM | POA: Diagnosis not present

## 2020-06-18 DIAGNOSIS — J101 Influenza due to other identified influenza virus with other respiratory manifestations: Secondary | ICD-10-CM

## 2020-06-18 DIAGNOSIS — I509 Heart failure, unspecified: Secondary | ICD-10-CM | POA: Diagnosis not present

## 2020-06-18 LAB — CBC
HCT: 41.8 % (ref 39.0–52.0)
Hemoglobin: 13.9 g/dL (ref 13.0–17.0)
MCH: 31.4 pg (ref 26.0–34.0)
MCHC: 33.3 g/dL (ref 30.0–36.0)
MCV: 94.6 fL (ref 80.0–100.0)
Platelets: 168 10*3/uL (ref 150–400)
RBC: 4.42 MIL/uL (ref 4.22–5.81)
RDW: 15 % (ref 11.5–15.5)
WBC: 6.3 10*3/uL (ref 4.0–10.5)
nRBC: 0 % (ref 0.0–0.2)

## 2020-06-18 LAB — BASIC METABOLIC PANEL
Anion gap: 6 (ref 5–15)
BUN: 19 mg/dL (ref 8–23)
CO2: 25 mmol/L (ref 22–32)
Calcium: 8.4 mg/dL — ABNORMAL LOW (ref 8.9–10.3)
Chloride: 103 mmol/L (ref 98–111)
Creatinine, Ser: 0.78 mg/dL (ref 0.61–1.24)
GFR, Estimated: >60 mL/min (ref >60)
Glucose, Bld: 99 mg/dL (ref 70–99)
Potassium: 4.6 mmol/L (ref 3.5–5.1)
Sodium: 134 mmol/L — ABNORMAL LOW (ref 135–145)

## 2020-06-18 LAB — PROTIME-INR
INR: 1.7 — ABNORMAL HIGH (ref 0.8–1.2)
Prothrombin Time: 19.5 seconds — ABNORMAL HIGH (ref 11.4–15.2)

## 2020-06-18 NOTE — Progress Notes (Signed)
Physical Therapy Treatment Patient Details Name: Tyler Williams MRN: [id number] DOB: [birth year] Today's Date: 06/18/2020    History of Present Illness The pt is a 87 yo male presenting 4/29 from [place] with BLE swelling and SOB that has been progressively worsening over last few days. Pt found to have elevated troponin, INR of 7.7, and the flu with PNA upon work up, admitted for management of troponin. PMH includes: afib, AAA s/p repair, arthritis, CAD, HTN, PVD, s/p TAVR (2018), and multiple joint replacements.    PT Comments    Pt experiences one LOB, posteriorly with initial sit>stand transfer, requiring assistance to recover. No other LOB noted for remainder of tasks. Pt reports feeling less agile compared to last session. Pt demonstrates ability to ambulate community distances with dynamic gait challenges, limited by ability to dual task when scanning requiring frequent stops. Pt demonstrates reduced gait speed with static and dynamic ambulation. Pt tolerates side-stepping, backwards walking, and walking around obstacles with decreased gait speed. Pt will continue to benefit from acute PT services to address deficits in strength, endurance, balance, and activity tolerance for safe and independent mobility.   Follow Up Recommendations  Home health PT;Supervision for mobility/OOB     Equipment Recommendations  None recommended by PT    Recommendations for Other Services       Precautions / Restrictions Precautions Precautions: Fall Precaution Comments: fell on 4/24 Restrictions Weight Bearing Restrictions: No    Mobility  Bed Mobility Overal bed mobility: Modified Independent Bed Mobility: Supine to Sit     Supine to sit: Modified independent (Device/Increase time);HOB elevated          Transfers Overall transfer level: Needs assistance Equipment used: Rolling walker (2 wheeled) Transfers: Sit to/from Stand Sit to Stand: Min assist         General  transfer comment: Posterior LOB noted upon standing, requiring min A for recovery.  Ambulation/Gait Ambulation/Gait assistance: Min guard Gait Distance (Feet): 1000 Feet Assistive device: Rolling walker (2 wheeled) Gait Pattern/deviations: Step-through pattern;Step-to pattern;Decreased stride length;Trunk flexed Gait velocity: decreased Gait velocity interpretation: <1.31 ft/sec, indicative of household ambulator General Gait Details: Pt ambulates > 1000 ft, with decreased gait speed compared to last session. Pt also reports feeling less agile compared to previous session. Pt challenged with dynamic gait tasks, including walking around obstacles, scanning, side-stepping, and backwards walking. Pt decreases gait speed and comes to a stop during scanning attempts.   Stairs             Wheelchair Mobility    Modified Rankin (Stroke Patients Only)       Balance Overall balance assessment: Needs assistance Sitting-balance support: No upper extremity supported;Feet supported Sitting balance-Leahy Scale: Good     Standing balance support: During functional activity;No upper extremity supported;Bilateral upper extremity supported Standing balance-Leahy Scale: Poor Standing balance comment: Pt experiences posterior LOB with standing attempt at beginning of session, with min A to recover.                            Cognition Arousal/Alertness: Awake/alert Behavior During Therapy: WFL for tasks assessed/performed Overall Cognitive Status: Impaired/Different from baseline Area of Impairment: Memory                     Memory: Decreased short-term memory         General Comments: Pt has difficulty remembering room number.      Exercises  General Comments General comments (skin integrity, edema, etc.): Pt tolerates dynamic gait tasks, with reduced gait speed during session today. Pt reports this was his first time being OOB today.      Pertinent  Vitals/Pain Pain Assessment: No/denies pain    Home Living                      Prior Function            PT Goals (current goals can now be found in the care plan section) Acute Rehab PT Goals Patient Stated Goal: Return home. Progress towards PT goals: Progressing toward goals    Frequency    Min 3X/week      PT Plan Current plan remains appropriate    Co-evaluation              AM-PAC PT "6 Clicks" Mobility   Outcome Measure  Help needed turning from your back to your side while in a flat bed without using bedrails?: None Help needed moving from lying on your back to sitting on the side of a flat bed without using bedrails?: None Help needed moving to and from a bed to a chair (including a wheelchair)?: A Little Help needed standing up from a chair using your arms (e.g., wheelchair or bedside chair)?: A Little Help needed to walk in hospital room?: A Little Help needed climbing 3-5 steps with a railing? : A Little 6 Click Score: 20    End of Session Equipment Utilized During Treatment: Gait belt Activity Tolerance: Patient tolerated treatment well Patient left: with chair alarm set;with call bell/phone within reach;with family/visitor present Nurse Communication: Mobility status PT Visit Diagnosis: Other abnormalities of gait and mobility (R26.89);Muscle weakness (generalized) (M62.81);History of falling (Z91.81)     Time: 0254-2706 PT Time Calculation (min) (ACUTE ONLY): 35 min  Charges:  $Gait Training: 8-22 mins $Therapeutic Activity: 8-22 mins                     Acute Rehab  Pager: [phone]    Garwin Brothers, SPT  06/18/2020, 3:55 PM

## 2020-06-18 NOTE — Progress Notes (Signed)
[facility] PROGRESS NOTE    Progress Note  Tyler Williams  [id number] DOB: 1926-03-18 DOA: 06/11/2020 PCP: Burnard Bunting, MD     Brief Narrative:   Tyler Williams is an 87 y.o. male past medical history significant for permanent atrial fibrillation on Coumadin, coronary artery disease, status post AAA repair, aortic stenosis status post TAVR was in [id number], chronic diastolic heart failure, comes into the ED for worsening productive cough that started 4 days prior to admission associated with lower extremity edema and generalized weakness.  In the ED BNP was 1200 chest x-ray showed bilateral pulmonary infiltrates influenza A was positive COVID-19 PCR was negative INR was 7.7 IV diuresis by the ED and cardiology has been consulted.   Assessment/Plan:   Acute on chronic combined systolic and diastolic heart failure: Likely secondary to noncompliance with diuretic. His diuresis has slowed down, he is about 10.5 L negative. His potassium was repleted, basic metabolic panel is pending this morning. Continue diuresis per cardiology. Continue strict I's and O's and daily weights. Started on Entresto on 06/17/2020, continue metoprolol, Imdur and hydralazine.  Influenza A viral pneumonia: Tamiflu and admission for 5 days. He was started empirically on Rocephin his procalcitonin was 0.1 Discontinue  Rocephin.  Not have a bacterial infection on admission.  Elevated troponin/history of coronary artery disease In the setting of demand ischemia. Denies any chest pain.  Acute urinary retention with frank hematuria and UTI Urine culture not obtained on admission was given  Rocephin.  Paroxysmal atrial fibrillation on Coumadin There with gross hematuria mild drop in hemoglobin to 12, hold Coumadin allow INR to drift down until hematuria has been cleared. Continue Toprol for rate control.  Essential hypertension Blood pressure is slowly improving, will resume his home  medications.  Hypokalemia/hypomagnesemia: Likely due to diuresis replete and recheck.  Gross hematuria: They were unsuccessful in placing Foley, urology was consulted.  For Foley placement which was traumatic. INR still 1.7, continues to have gross hematuria, CBC is pending this morning. Neurology recommended to continue Foley for 10 days and follow-up with them as an outpatient. We will need to continue to hold Coumadin until gross hematuria subsides.  DVT prophylaxis: Coumadin Family Communication:none Status is: Inpatient  Remains inpatient appropriate because:Hemodynamically unstable   Dispo: The patient is from: Home              Anticipated d/c is to: Home              Patient currently is not medically stable to d/c.   Difficult to place patient No        Code Status:     Code Status Orders  (From admission, onward)         Start     Ordered   06/12/20 0435  Full code  Continuous        06/12/20 0434        Code Status History    Date Active Date Inactive Code Status Order ID Comments User Context   08/28/2017 0337 09/01/2017 1420 Full Code [id number]  Norval Morton, MD ED   06/13/2016 1646 06/15/2016 2028 Full Code [id number]  Rexene Alberts, MD Inpatient   05/12/2016 1435 05/12/2016 2141 Full Code [id number]  Sherren Mocha, MD Inpatient   12/04/2012 0000 12/07/2012 1544 Full Code [id number]  Orvan Falconer, MD Inpatient   01/22/2012 1522 01/23/2012 1440 Full Code [id number]  Redge Gainer, RN Inpatient   Advance Care Planning Activity  IV Access:    Peripheral IV   Procedures and diagnostic studies:   No results found.   Medical Consultants:    None.   Subjective:    Tyler Williams has no new complaints this morning.  Objective:    Vitals:   06/17/20 2145 06/17/20 2145 06/18/20 0315 06/18/20 0536  BP: (!) 158/73 (!) 158/73  (!) 147/54  Pulse:  74  60  Resp:    17  Temp:    97.6 F (36.4 C)  TempSrc:    Oral  SpO2:     96%  Weight:   74.3 kg   Height:       SpO2: 96 %   Intake/Output Summary (Last 24 hours) at 06/18/2020 0747 Last data filed at 06/18/2020 0500 Gross per 24 hour  Intake 860 ml  Output 2350 ml  Net -1490 ml   Filed Weights   06/16/20 0419 06/17/20 0428 06/18/20 0315  Weight: 74.8 kg 76 kg 74.3 kg    Exam: General exam: In no acute distress. Respiratory system: Good air movement and clear to auscultation. Cardiovascular system: S1 & S2 heard, RRR. No JVD. Gastrointestinal system: Abdomen is nondistended, soft and nontender.  Extremities: No pedal edema. Skin: No rashes, lesions or ulcers Psychiatry: Judgement and insight appear normal. Mood & affect appropriate.  Data Reviewed:    Labs: Basic Metabolic Panel: Recent Labs  Lab 06/12/20 0354 06/13/20 0231 06/14/20 0420 06/15/20 0403 06/16/20 0436 06/17/20 0825  NA 142 138 138 137 136  --   K 3.4* 3.6 4.6 4.2 3.3*  --   CL 107 102 99 100 103  --   CO2 26 27 29 30 28   --   GLUCOSE 87 98 108* 104* 94  --   BUN 20 16 15  24* 22  --   CREATININE 0.88 0.83 0.90 0.90 0.75  --   CALCIUM 8.5* 8.3* 8.5* 8.4* 8.1*  --   MG 2.0 1.8 2.1  --   --  2.0  PHOS 3.0 2.6  --   --   --   --    GFR Estimated Creatinine Clearance: 57 mL/min (by C-G formula based on SCr of 0.75 mg/dL). Liver Function Tests: Recent Labs  Lab 06/12/20 0027  AST 40  ALT 25  ALKPHOS 100  BILITOT 1.1  PROT 5.8*  ALBUMIN 2.6*   No results for input(s): LIPASE, AMYLASE in the last 168 hours. No results for input(s): AMMONIA in the last 168 hours. Coagulation profile Recent Labs  Lab 06/14/20 0420 06/15/20 0403 06/16/20 0436 06/17/20 0301 06/18/20 0517  INR 3.8* 3.4* 2.2* 1.8* 1.7*   COVID-19 Labs  No results for input(s): DDIMER, FERRITIN, LDH, CRP in the last 72 hours.  Lab Results  Component Value Date   [place] NEGATIVE 06/12/2020    CBC: Recent Labs  Lab 06/12/20 0027 06/12/20 0354 06/13/20 0231 06/14/20 0420  06/15/20 0403 06/16/20 0436  WBC 3.2* 3.6* 4.3 7.2  --  4.7  NEUTROABS 2.0  --   --   --   --   --   HGB 14.0 14.7 15.2 14.7 13.0 12.2*  HCT 44.4 45.4 46.4 45.2 39.3 37.2*  MCV 98.2 96.6 94.9 95.4  --  95.4  PLT 100* 103* 103* 107*  --  121*   Cardiac Enzymes: No results for input(s): CKTOTAL, CKMB, CKMBINDEX, TROPONINI in the last 168 hours. BNP (last 3 results) No results for input(s): PROBNP in the last 8760 hours. CBG: No results  for input(s): GLUCAP in the last 168 hours. D-Dimer: No results for input(s): DDIMER in the last 72 hours. Hgb A1c: No results for input(s): HGBA1C in the last 72 hours. Lipid Profile: No results for input(s): CHOL, HDL, LDLCALC, TRIG, CHOLHDL, LDLDIRECT in the last 72 hours. Thyroid function studies: No results for input(s): TSH, T4TOTAL, T3FREE, THYROIDAB in the last 72 hours.  Invalid input(s): FREET3 Anemia work up: No results for input(s): VITAMINB12, FOLATE, FERRITIN, TIBC, IRON, RETICCTPCT in the last 72 hours. Sepsis Labs: Recent Labs  Lab 06/12/20 0354 06/13/20 0231 06/14/20 0420 06/16/20 0436  PROCALCITON <0.10  --   --   --   WBC 3.6* 4.3 7.2 4.7   Microbiology Recent Results (from the past 240 hour(s))  Resp Panel by RT-PCR (Flu A&B, Covid) Nasopharyngeal Swab     Status: Abnormal   Collection Time: 06/12/20 12:27 AM   Specimen: Nasopharyngeal Swab; Nasopharyngeal(NP) swabs in vial transport medium  Result Value Ref Range Status   SARS Coronavirus 2 by RT PCR NEGATIVE NEGATIVE Final    Comment: (NOTE) SARS-CoV-2 target nucleic acids are NOT DETECTED.  The SARS-CoV-2 RNA is generally detectable in upper respiratory specimens during the acute phase of infection. The lowest concentration of SARS-CoV-2 viral copies this assay can detect is 138 copies/mL. A negative result does not preclude SARS-Cov-2 infection and should not be used as the sole basis for treatment or other patient management decisions. A negative result may  occur with  improper specimen collection/handling, submission of specimen other than nasopharyngeal swab, presence of viral mutation(s) within the areas targeted by this assay, and inadequate number of viral copies(<138 copies/mL). A negative result must be combined with clinical observations, patient history, and epidemiological information. The expected result is Negative.  Fact Sheet for Patients:  EntrepreneurPulse.com.au  Fact Sheet for Healthcare Providers:  IncredibleEmployment.be  This test is no t yet approved or cleared by the [place] FDA and  has been authorized for detection and/or diagnosis of SARS-CoV-2 by FDA under an Emergency Use Authorization (EUA). This EUA will remain  in effect (meaning this test can be used) for the duration of the COVID-19 declaration under Section 564(b)(1) of the Act, 21 U.S.C.section 360bbb-3(b)(1), unless the authorization is terminated  or revoked sooner.       Influenza A by PCR POSITIVE (A) NEGATIVE Final   Influenza B by PCR NEGATIVE NEGATIVE Final    Comment: (NOTE) The Xpert Xpress SARS-CoV-2/FLU/RSV plus assay is intended as an aid in the diagnosis of influenza from Nasopharyngeal swab specimens and should not be used as a sole basis for treatment. Nasal washings and aspirates are unacceptable for Xpert Xpress SARS-CoV-2/FLU/RSV testing.  Fact Sheet for Patients: EntrepreneurPulse.com.au  Fact Sheet for Healthcare Providers: IncredibleEmployment.be  This test is not yet approved or cleared by the [place] FDA and has been authorized for detection and/or diagnosis of SARS-CoV-2 by FDA under an Emergency Use Authorization (EUA). This EUA will remain in effect (meaning this test can be used) for the duration of the COVID-19 declaration under Section 564(b)(1) of the Act, 21 U.S.C. section 360bbb-3(b)(1), unless the authorization is terminated  or revoked.  Performed at [facility], [place] [address], [place], [place] [address]   Culture, Urine     Status: None   Collection Time: 06/14/20  9:10 AM   Specimen: Urine, Random  Result Value Ref Range Status   Specimen Description URINE, RANDOM  Final   Special Requests NONE  Final  Culture   Final    NO GROWTH Performed at [facility], [place] [address], [place], [place] [address]    Report Status 06/15/2020 FINAL  Final     Medications:   . aspirin EC  81 mg Oral Daily  . atorvastatin  20 mg Oral Daily  . Chlorhexidine Gluconate Cloth  6 each Topical Daily  . fesoterodine  8 mg Oral QPM  . finasteride  5 mg Oral Daily  . furosemide  80 mg Oral Daily  . guaiFENesin  1,200 mg Oral BID  . hydrALAZINE  10 mg Oral Q8H  . isosorbide mononitrate  120 mg Oral Daily  . metoprolol succinate  25 mg Oral Daily  . polyethylene glycol  17 g Oral BID  . sacubitril-valsartan  1 tablet Oral BID  . tamsulosin  0.4 mg Oral Daily  . Warfarin - Pharmacist Dosing Inpatient   Does not apply q1600   Continuous Infusions:    LOS: 6 days   [facility]  06/18/2020, 7:47 AM

## 2020-06-18 NOTE — Progress Notes (Signed)
Progress Note  Patient Name: Tyler Williams Date of Encounter: 06/18/2020  [facility] Cardiologist: Sherren Mocha, MD   Subjective   Still having hematuria.  Denies any chest pain or SOB.  Still coughing some  Inpatient Medications    Scheduled Meds: . aspirin EC  81 mg Oral Daily  . atorvastatin  20 mg Oral Daily  . Chlorhexidine Gluconate Cloth  6 each Topical Daily  . fesoterodine  8 mg Oral QPM  . finasteride  5 mg Oral Daily  . furosemide  80 mg Oral Daily  . guaiFENesin  1,200 mg Oral BID  . hydrALAZINE  10 mg Oral Q8H  . isosorbide mononitrate  120 mg Oral Daily  . metoprolol succinate  25 mg Oral Daily  . polyethylene glycol  17 g Oral BID  . sacubitril-valsartan  1 tablet Oral BID  . tamsulosin  0.4 mg Oral Daily  . Warfarin - Pharmacist Dosing Inpatient   Does not apply q1600   Continuous Infusions:  PRN Meds: HYDROmorphone (DILAUDID) injection, ipratropium-albuterol, LORazepam, nitroGLYCERIN   Vital Signs    Vitals:   06/17/20 2145 06/17/20 2145 06/18/20 0315 06/18/20 0536  BP: (!) 158/73 (!) 158/73  (!) 147/54  Pulse:  74  60  Resp:    17  Temp:    97.6 F (36.4 C)  TempSrc:    Oral  SpO2:    96%  Weight:   74.3 kg   Height:        Intake/Output Summary (Last 24 hours) at 06/18/2020 0845 Last data filed at 06/18/2020 0700 Gross per 24 hour  Intake 660 ml  Output 2350 ml  Net -1690 ml   Last 3 Weights 06/18/2020 06/17/2020 06/16/2020  Weight (lbs) 163 lb 12.8 oz 167 lb 8.8 oz 164 lb 14.5 oz  Weight (kg) 74.3 kg 76 kg 74.8 kg      Telemetry    Atrial fibrillation with CVR- Personally Reviewed  ECG    No new EKG to review - Personally Reviewed  Physical Exam   GEN: Well nourished, well developed in no acute distress HEENT: Normal NECK: No JVD; No carotid bruits LYMPHATICS: No lymphadenopathy CARDIAC:RRR, no murmurs, rubs, gallops RESPIRATORY:  Clear to auscultation without rales, wheezing or rhonchi  ABDOMEN: Soft, non-tender,  non-distended MUSCULOSKELETAL:  No edema; No deformity  SKIN: Warm and dry NEUROLOGIC:  Alert and oriented x 3 PSYCHIATRIC:  Normal affect    Labs    High Sensitivity Troponin:   Recent Labs  Lab 06/12/20 0027 06/12/20 0239 06/12/20 0354 06/12/20 0532  TROPONINIHS 103* 117* 105* 111*      Chemistry Recent Labs  Lab 06/12/20 0027 06/12/20 0354 06/14/20 0420 06/15/20 0403 06/16/20 0436  NA 136   < > 138 137 136  K 3.4*   < > 4.6 4.2 3.3*  CL 107   < > 99 100 103  CO2 22   < > 29 30 28   GLUCOSE 163*   < > 108* 104* 94  BUN 21   < > 15 24* 22  CREATININE 0.89   < > 0.90 0.90 0.75  CALCIUM 8.0*   < > 8.5* 8.4* 8.1*  PROT 5.8*  --   --   --   --   ALBUMIN 2.6*  --   --   --   --   AST 40  --   --   --   --   ALT 25  --   --   --   --  ALKPHOS 100  --   --   --   --   BILITOT 1.1  --   --   --   --   GFRNONAA >60   < > >60 >60 >60  ANIONGAP 7   < > 10 7 5    < > = values in this interval not displayed.     Hematology Recent Labs  Lab 06/14/20 0420 06/15/20 0403 06/16/20 0436 06/18/20 0803  WBC 7.2  --  4.7 6.3  RBC 4.74  --  3.90* 4.42  HGB 14.7 13.0 12.2* 13.9  HCT 45.2 39.3 37.2* 41.8  MCV 95.4  --  95.4 94.6  MCH 31.0  --  31.3 31.4  MCHC 32.5  --  32.8 33.3  RDW 15.7*  --  15.4 15.0  PLT 107*  --  121* 168    BNP Recent Labs  Lab 06/12/20 0027  BNP 1,215.1*     DDimer No results for input(s): DDIMER in the last 168 hours.   CHA2DS2-VASc Score = 5  This indicates a 7.2% annual risk of stroke. The patient's score is based upon: CHF History: Yes HTN History: Yes Diabetes History: No Stroke History: No Vascular Disease History: Yes Age Score: 2 Gender Score: 0     Radiology    No results found.  Cardiac Studies   2D echo 05/2020 IMPRESSIONS   1. Left ventricular ejection fraction, by estimation, is 45 to 50%. The  left ventricle has mildly decreased function. The left ventricle  demonstrates regional wall motion abnormalities  (see scoring  diagram/findings for description). There is mild  concentric left ventricular hypertrophy. Left ventricular diastolic  parameters are consistent with Grade II diastolic dysfunction  (pseudonormalization). Elevated left atrial pressure. There is moderate  hypokinesis of the left ventricular, basal-mid  inferior wall and inferolateral wall.  2. Right ventricular systolic function is mildly reduced. The right  ventricular size is normal. There is mildly elevated pulmonary artery  systolic pressure.  3. Left atrial size was severely dilated.  4. Right atrial size was moderately dilated.  5. The mitral valve is normal in structure. Mild to moderate mitral valve  regurgitation.  6. The aortic valve has been repaired/replaced. Aortic valve  regurgitation is trivial. No aortic stenosis is present. There is a 29 mm  Sapien prosthetic (TAVR) valve present in the aortic position. Aortic  valve mean gradient measures 6.0 mmHg. Aortic  valve Vmax measures 1.82 m/s.  7. The inferior vena cava is dilated in size with <50% respiratory  variability, suggesting right atrial pressure of 15 mmHg.   Comparison(s): Prior images unable to be directly viewed, comparison made  by report only. The left ventricular function is worsened. The left  ventricular wall motion abnormality is unchanged.   Patient Profile     87 y.o. male with a PMH of CAD s/p POBA to RCA remotely, permanent atrial fibrillation on coumadin, chronic diastolic CHF, aortic stenosis s/p TAVR in 2018, AAA s/p repair, chronic venous insufficiency, HTN, HLD, who is being seen for the evaluation of abnormal echocardiogram at the request of Dr. Doristine Bosworth  Assessment & Plan    1. Acute on chronic diastolic CHF: patient has known history of diastolic CHF, now with evidence of systolic dysfunction with EF 45-50% on echo this admission. Side by side comparison was not available to compare to previous though on Dr. Theodosia Blender review,  likely EF is in the 50-55% range. RWMA were noted to be unchanged. He has had progressive  SOB and LE edema recently. BNP elevated to 1200. Suspect poor nutrition status is also contributing to his volume overload given albumin 2.6.  - he put out 2.3L yesterday and is net neg 10.6L since admit - BMET pending this am - Cxray 5/2 with clear lungs and small basilar pleural effusions - continue Lasix 80mg  PO daily, Toprol XL 25mg  daily, Entresto 24-26mg  BID, Hydralazine and Imdur 120mg  daily. - Continue strict I&Os and daily weights - Continue to monitor electrolytes and replete as needed to maintain K >4, Mg >2  2. Influenza A with superimposed bacterial PNA: likely contributing to his symptoms of cough and weakness. CXR showed moderate bibasilar atelectasis/infiltrate. He was started on tamiflu and CTX.  - Continue antibiotics and supportive care per primary team  3. CAD s/p POBA to RCA remotely: - He denies any anginal symptoms  - HsTrop 105>111, low flat trend not c/w ACS. EKG non-ischemic.  - Continue aspirin, Imdur, BBlocker and statin  4. Permanent atrial fibrillation: rates well controlled this admission. INR has been supratherapeutic 7.7>7.9>6.3>3.8>2.2>1.8.  - HR remains controlled on tele - Continue metoprolol for rate control - Coumadin currently on hold due to hematuria  5. Aortic stenosis s/p TAVR in 2018: valve appears stable on echo this admission - Continue routine monitoring.   6. HTN:  - BP borderline controlled over the past 24 hours  -will continue to follow - Continue lasix, imdur, Toprol, Entresto and Hydralazine - cannot increase BB due to borderline low HR  7. Urinary retention: patient with difficulty urinating today. Bladder scan with 500-600cc. RN attempted 2 foley placements but unsuccessful. Urology consulted - catheter placed and now with gross hematuria due to traumatic placement on anticoagulation - Continue management per primary team and urology.    8. HLD:  - Continue atorvastatin  9.  Hematuria -this has not improved -initially thought to be related to traumatic foley insertion in setting of anticoagulation but has not improved -recommend Urology to see back today for recommendations since he is on anticoagulation for afib    I have spent a total of 35 minutes with patient reviewing 2D echo , telemetry, EKGs, labs and examining patient as well as establishing an assessment and plan that was discussed with the patient.  > 50% of time was spent in direct patient care.    For questions or updates, please contact [place] Please consult [email or web address] for contact info under        Signed, Fransico Him, MD  06/18/2020, 8:45 AM

## 2020-06-18 NOTE — Progress Notes (Signed)
ANTICOAGULATION CONSULT NOTE - Follow Up Consult  Pharmacy Consult for Coumadin Indication: atrial fibrillation  Allergies  Allergen Reactions  . No Known Allergies Other (See Comments)    NKA    Patient Measurements: Height: 5\' 8"  (172.7 cm) Weight: 74.3 kg (163 lb 12.8 oz) IBW/kg (Calculated) : 68.4  Vital Signs: Temp: 97.6 F (36.4 C) (05/06 0536) Temp Source: Oral (05/06 0536) BP: 147/54 (05/06 0536) Pulse Rate: 60 (05/06 0536)  Labs: Recent Labs    06/16/20 0436 06/17/20 0301 06/18/20 0517 06/18/20 0803  HGB 12.2*  --   --  13.9  HCT 37.2*  --   --  41.8  PLT 121*  --   --  168  LABPROT 24.7* 20.4* 19.5*  --   INR 2.2* 1.8* 1.7*  --   CREATININE 0.75  --   --  0.78    Estimated Creatinine Clearance: 57 mL/min (by C-G formula based on SCr of 0.78 mg/dL).  Assessment: 87 yo M on warfarin PTA for afib. Admit INR 7.7 on 5mg  daily, down to 3.8 after holding x2 days. Pharmacy consulted to dose warfarin.   Patient with traumatic foley insertion and continued hematuria (frank blood per MD). H/H trended back to 13.9, plt stable. INR 1.8> 1.7. Per discussion with MD, will continue to hold warfarin until hematuria resolves.   Home dose: 5mg  daily   Goal of Therapy:  INR 2-3  when hematuria resolves Monitor platelets by anticoagulation protocol: Yes   Plan:  Hold warfarin until hematuria resolves Monitor daily INR, CBC/plt Monitor for signs/symptoms of bleeding/hematuria  Benetta Spar, PharmD, BCPS, BCCP Clinical Pharmacist  Please check AMION for all [place] phone numbers After 10:00 PM, call [place]

## 2020-06-18 NOTE — Care Management Important Message (Signed)
Important Message  Patient Details  Name: Tyler Williams MRN: [id number] Date of Birth: Jan 03, 1927   Medicare Important Message Given:  Yes     Shelda Altes 06/18/2020, 9:55 AM

## 2020-06-19 DIAGNOSIS — I5043 Acute on chronic combined systolic (congestive) and diastolic (congestive) heart failure: Secondary | ICD-10-CM | POA: Diagnosis not present

## 2020-06-19 DIAGNOSIS — R310 Gross hematuria: Secondary | ICD-10-CM | POA: Diagnosis not present

## 2020-06-19 DIAGNOSIS — J101 Influenza due to other identified influenza virus with other respiratory manifestations: Secondary | ICD-10-CM | POA: Diagnosis not present

## 2020-06-19 LAB — PROTIME-INR
INR: 1.5 — ABNORMAL HIGH (ref 0.8–1.2)
Prothrombin Time: 18.1 seconds — ABNORMAL HIGH (ref 11.4–15.2)

## 2020-06-19 LAB — BASIC METABOLIC PANEL
Anion gap: 9 (ref 5–15)
BUN: 30 mg/dL — ABNORMAL HIGH (ref 8–23)
CO2: 24 mmol/L (ref 22–32)
Calcium: 8.2 mg/dL — ABNORMAL LOW (ref 8.9–10.3)
Chloride: 100 mmol/L (ref 98–111)
Creatinine, Ser: 0.83 mg/dL (ref 0.61–1.24)
GFR, Estimated: >60 mL/min (ref >60)
Glucose, Bld: 97 mg/dL (ref 70–99)
Potassium: 4.3 mmol/L (ref 3.5–5.1)
Sodium: 133 mmol/L — ABNORMAL LOW (ref 135–145)

## 2020-06-19 MED ORDER — SODIUM CHLORIDE 0.9 % IV SOLN
12.5000 mg | Freq: Four times a day (QID) | INTRAVENOUS | Status: DC | PRN
  Administered 2020-06-19: 12.5 mg via INTRAVENOUS
  Filled 2020-06-19 (×2): qty 0.5

## 2020-06-19 NOTE — Progress Notes (Signed)
Progress Note  Patient Name: Tyler Williams Date of Encounter: 06/19/2020  [facility] Cardiologist: Sherren Mocha, MD   Subjective   NAEO. Urine continues to be bloody. No dyspnea. Daughter at bedside.  Inpatient Medications    Scheduled Meds: . aspirin EC  81 mg Oral Daily  . atorvastatin  20 mg Oral Daily  . Chlorhexidine Gluconate Cloth  6 each Topical Daily  . fesoterodine  8 mg Oral QPM  . finasteride  5 mg Oral Daily  . furosemide  80 mg Oral Daily  . guaiFENesin  1,200 mg Oral BID  . hydrALAZINE  10 mg Oral Q8H  . isosorbide mononitrate  120 mg Oral Daily  . metoprolol succinate  25 mg Oral Daily  . polyethylene glycol  17 g Oral BID  . sacubitril-valsartan  1 tablet Oral BID  . tamsulosin  0.4 mg Oral Daily  . Warfarin - Pharmacist Dosing Inpatient   Does not apply q1600   Continuous Infusions:  PRN Meds: HYDROmorphone (DILAUDID) injection, ipratropium-albuterol, LORazepam, nitroGLYCERIN   Vital Signs    Vitals:   06/19/20 0353 06/19/20 0530 06/19/20 0530 06/19/20 0917  BP: (!) 146/64 135/65 135/65 (!) 153/76  Pulse: 69  63 73  Resp: 19   18  Temp: (!) 97.4 F (36.3 C)     TempSrc: Oral     SpO2: 98%   96%  Weight:      Height:        Intake/Output Summary (Last 24 hours) at 06/19/2020 1057 Last data filed at 06/19/2020 1019 Gross per 24 hour  Intake 590 ml  Output 1575 ml  Net -985 ml   Last 3 Weights 06/19/2020 06/18/2020 06/17/2020  Weight (lbs) 163 lb 5.8 oz 163 lb 12.8 oz 167 lb 8.8 oz  Weight (kg) 74.1 kg 74.3 kg 76 kg      Telemetry    reviewed - Personally Reviewed  ECG    No new - Personally Reviewed  Physical Exam   GEN: No acute distress.   Neck: No JVD Cardiac: RRR, no murmurs, rubs, or gallops.  Respiratory: Clear to auscultation bilaterally. GI/GU: Soft, nontender, non-distended. Hematuria in foley bag MS: No edema; No deformity. Neuro:  Nonfocal  Psych: Normal affect   Labs    High Sensitivity Troponin:    Recent Labs  Lab 06/12/20 0027 06/12/20 0239 06/12/20 0354 06/12/20 0532  TROPONINIHS 103* 117* 105* 111*      Chemistry Recent Labs  Lab 06/16/20 0436 06/18/20 0803 06/19/20 0301  NA 136 134* 133*  K 3.3* 4.6 4.3  CL 103 103 100  CO2 28 25 24   GLUCOSE 94 99 97  BUN 22 19 30*  CREATININE 0.75 0.78 0.83  CALCIUM 8.1* 8.4* 8.2*  GFRNONAA >60 >60 >60  ANIONGAP 5 6 9      Hematology Recent Labs  Lab 06/14/20 0420 06/15/20 0403 06/16/20 0436 06/18/20 0803  WBC 7.2  --  4.7 6.3  RBC 4.74  --  3.90* 4.42  HGB 14.7 13.0 12.2* 13.9  HCT 45.2 39.3 37.2* 41.8  MCV 95.4  --  95.4 94.6  MCH 31.0  --  31.3 31.4  MCHC 32.5  --  32.8 33.3  RDW 15.7*  --  15.4 15.0  PLT 107*  --  121* 168    BNPNo results for input(s): BNP, PROBNP in the last 168 hours.   DDimer No results for input(s): DDIMER in the last 168 hours.   Radiology  No results found.  Cardiac Studies   No new  Patient Profile     87 y.o. male w CAD, perm AF on coumadin, chronic diastolic HF, AS s/p TAVR in 2018, AAA s/p repair, HTN, HLD who has a mildly decreased LV function on recent echo.  Assessment & Plan   #hematuria Urology consult pending  #Chronic diastolic HF - earm and nearly euvolemic on my exam today - cont lasix 80mg  PO daily - cont toprol and entresto and hydral and imdur  #Influenza A - abx per primary  #CAD - cont asa, bb, statin  #perm AF Restart coumadin when felt safe from a urologic perspective   For questions or updates, please contact [place] Please consult [email or web address] for contact info under        Signed, Vickie Epley, MD  06/19/2020, 10:57 AM

## 2020-06-19 NOTE — Progress Notes (Signed)
Progress Note    Tyler Williams   GEX:[id number]  DOB: 1926-05-26  DOA: 06/11/2020     7  PCP: Burnard Bunting, MD  CC: cough, hematuria  Hospital Course: Tyler Williams is a 87 y.o. male past medical history significant for permanent atrial fibrillation on Coumadin, coronary artery disease, status post AAA repair, aortic stenosis status post TAVR was in 0102, chronic diastolic heart failure, comes into the ED for worsening productive cough that started 4 days prior to admission associated with lower extremity edema and generalized weakness.  He was also having voiding with difficulty which is an intermittent problem at home.  In the ED BNP was 1200 chest x-ray showed bilateral pulmonary infiltrates influenza A was positive COVID-19 PCR was negative.  INR was 7.7  Interval History:  No events overnight.  Resting in bed comfortably when seen this morning.  Daughter bedside.  Still having hematuria.  Reviewed overall plan of care that we are awaiting further resolution of his hematuria before restarting Coumadin and hopefully discharging home soon after.  ROS: Constitutional: negative for chills and fevers, Respiratory: negative for cough, Cardiovascular: negative for chest pain and Gastrointestinal: negative for abdominal pain  Assessment & Plan:  Gross hematuria - s/p traumatic foley placement in ER on admission along with INR 7.7 - now has underwent foley placement by urology; has significant BPH and rec'd to remain on flomax and finasteride at discharge as well - still having hematuria, but INR has been coming down and suspect hematuria should be resolving fairly soon - Continue holding Coumadin until hematuria has resolved, then okay to resume - Continue Foley at discharge and follow-up outpatient with urology  Acute on chronic combined systolic and diastolic heart failure -Aggressively diuresed on admission - Now has stabilized.  Continue oral Lasix - Cardiology  has also been following, appreciate assistance - Continue Entresto (started 06/17/2020).  Continue Toprol, hydralazine, Imdur  Influenza A viral pneumonia Tamiflu and admission for 5 days.  Course completed He was started empirically on Rocephin; Discontinued Rocephin  Elevated troponin/history of coronary artery disease In the setting of demand ischemia. Denies any chest pain.  Acute urinary retention with frank hematuria Urine culture not obtained on admission was given Rocephin. -Etiology felt to be due to underlying BPH with attempted Foley in ER consider traumatic  Paroxysmal atrial fibrillation on Coumadin Continue Toprol for rate control. -See hematuria discussion  Essential hypertension Blood pressure is slowly improving, will resume his home medications.  Hypokalemia/hypomagnesemia: Likely due to diuresis replete and recheck.  Old records reviewed in assessment of this patient   DVT prophylaxis: Place TED hose Start: 06/16/20 7253   Code Status:   Code Status: Full Code Family Communication: Daughter  Disposition Plan: Status is: Inpatient  Remains inpatient appropriate because:Inpatient level of care appropriate due to severity of illness   Dispo: The patient is from: Home              Anticipated d/c is to: Home              Patient currently is not medically stable to d/c.   Difficult to place patient No  Risk of unplanned readmission score: Unplanned Admission- Pilot do not use: 20.86   Objective: Blood pressure (!) 123/57, pulse 70, temperature 97.9 F (36.6 C), temperature source Oral, resp. rate 17, height 5\' 8"  (1.727 m), weight 74.1 kg, SpO2 96 %.  Examination: General appearance: alert and cooperative Head: Normocephalic, without obvious abnormality, atraumatic Eyes: EOMI Lungs:  clear to auscultation bilaterally Heart: regular rate and rhythm and S1, S2 normal Abdomen: normal findings: bowel sounds normal and soft, non-tender  GU: Foley  bag noted with gross hematuria Extremities: No edema Skin: mobility and turgor normal Neurologic: Grossly normal  Consultants:   Cardiology  Urology  Procedures:   Urology placed Foley, 06/13/2020  Data Reviewed: I have personally reviewed following labs and imaging studies Results for orders placed or performed during the hospital encounter of 06/11/20 (from the past 24 hour(s))  Protime-INR     Status: Abnormal   Collection Time: 06/19/20  3:01 AM  Result Value Ref Range   Prothrombin Time 18.1 (H) 11.4 - 15.2 seconds   INR 1.5 (H) 0.8 - 1.2  Basic metabolic panel     Status: Abnormal   Collection Time: 06/19/20  3:01 AM  Result Value Ref Range   Sodium 133 (L) 135 - 145 mmol/L   Potassium 4.3 3.5 - 5.1 mmol/L   Chloride 100 98 - 111 mmol/L   CO2 24 22 - 32 mmol/L   Glucose, Bld 97 70 - 99 mg/dL   BUN 30 (H) 8 - 23 mg/dL   Creatinine, Ser 0.83 0.61 - 1.24 mg/dL   Calcium 8.2 (L) 8.9 - 10.3 mg/dL   GFR, Estimated >60 >60 mL/min   Anion gap 9 5 - 15    Recent Results (from the past 240 hour(s))  Resp Panel by RT-PCR (Flu A&B, Covid) Nasopharyngeal Swab     Status: Abnormal   Collection Time: 06/12/20 12:27 AM   Specimen: Nasopharyngeal Swab; Nasopharyngeal(NP) swabs in vial transport medium  Result Value Ref Range Status   SARS Coronavirus 2 by RT PCR NEGATIVE NEGATIVE Final    Comment: (NOTE) SARS-CoV-2 target nucleic acids are NOT DETECTED.  The SARS-CoV-2 RNA is generally detectable in upper respiratory specimens during the acute phase of infection. The lowest concentration of SARS-CoV-2 viral copies this assay can detect is 138 copies/mL. A negative result does not preclude SARS-Cov-2 infection and should not be used as the sole basis for treatment or other patient management decisions. A negative result may occur with  improper specimen collection/handling, submission of specimen other than nasopharyngeal swab, presence of viral mutation(s) within the areas  targeted by this assay, and inadequate number of viral copies(<138 copies/mL). A negative result must be combined with clinical observations, patient history, and epidemiological information. The expected result is Negative.  Fact Sheet for Patients:  EntrepreneurPulse.com.au  Fact Sheet for Healthcare Providers:  IncredibleEmployment.be  This test is no t yet approved or cleared by the [place] FDA and  has been authorized for detection and/or diagnosis of SARS-CoV-2 by FDA under an Emergency Use Authorization (EUA). This EUA will remain  in effect (meaning this test can be used) for the duration of the COVID-19 declaration under Section 564(b)(1) of the Act, 21 U.S.C.section 360bbb-3(b)(1), unless the authorization is terminated  or revoked sooner.       Influenza A by PCR POSITIVE (A) NEGATIVE Final   Influenza B by PCR NEGATIVE NEGATIVE Final    Comment: (NOTE) The Xpert Xpress SARS-CoV-2/FLU/RSV plus assay is intended as an aid in the diagnosis of influenza from Nasopharyngeal swab specimens and should not be used as a sole basis for treatment. Nasal washings and aspirates are unacceptable for Xpert Xpress SARS-CoV-2/FLU/RSV testing.  Fact Sheet for Patients: EntrepreneurPulse.com.au  Fact Sheet for Healthcare Providers: IncredibleEmployment.be  This test is not yet approved or cleared by the [place] and  has been authorized for detection and/or diagnosis of SARS-CoV-2 by FDA under an Emergency Use Authorization (EUA). This EUA will remain in effect (meaning this test can be used) for the duration of the COVID-19 declaration under Section 564(b)(1) of the Act, 21 U.S.C. section 360bbb-3(b)(1), unless the authorization is terminated or revoked.  Performed at [facility], [place] [address], [place], [place] [address]   Culture, Urine     Status: None   Collection Time: 06/14/20   9:10 AM   Specimen: Urine, Random  Result Value Ref Range Status   Specimen Description URINE, RANDOM  Final   Special Requests NONE  Final   Culture   Final    NO GROWTH Performed at [facility], [place] [address], [place],  [address]    Report Status 06/15/2020 FINAL  Final     Radiology Studies: No results found. DG Chest 2 View  Final Result    DG Chest Port 1 View  Final Result      Scheduled Meds: . aspirin EC  81 mg Oral Daily  . atorvastatin  20 mg Oral Daily  . Chlorhexidine Gluconate Cloth  6 each Topical Daily  . fesoterodine  8 mg Oral QPM  . finasteride  5 mg Oral Daily  . furosemide  80 mg Oral Daily  . guaiFENesin  1,200 mg Oral BID  . hydrALAZINE  10 mg Oral Q8H  . isosorbide mononitrate  120 mg Oral Daily  . metoprolol succinate  25 mg Oral Daily  . polyethylene glycol  17 g Oral BID  . sacubitril-valsartan  1 tablet Oral BID  . tamsulosin  0.4 mg Oral Daily  . Warfarin - Pharmacist Dosing Inpatient   Does not apply q1600   PRN Meds: HYDROmorphone (DILAUDID) injection, ipratropium-albuterol, LORazepam, nitroGLYCERIN Continuous Infusions:   LOS: 7 days  Time spent: Greater than 50% of the 35 minute visit was spent in counseling/coordination of care for the patient as laid out in the A&P.   Dwyane Dee, MD [facility] 06/19/2020, 1:27 PM

## 2020-06-19 NOTE — Progress Notes (Signed)
ANTICOAGULATION CONSULT NOTE - Follow Up Consult  Pharmacy Consult for Coumadin Indication: atrial fibrillation  Allergies  Allergen Reactions  . No Known Allergies Other (See Comments)    NKA    Patient Measurements: Height: 5\' 8"  (172.7 cm) Weight: 74.1 kg (163 lb 5.8 oz) IBW/kg (Calculated) : 68.4  Vital Signs: Temp: 97.4 F (36.3 C) (05/07 0353) Temp Source: Oral (05/07 0353) BP: 135/65 (05/07 0530) Pulse Rate: 63 (05/07 0530)  Labs: Recent Labs    06/17/20 0301 06/18/20 0517 06/18/20 0803 06/19/20 0301  HGB  --   --  13.9  --   HCT  --   --  41.8  --   PLT  --   --  168  --   LABPROT 20.4* 19.5*  --  18.1*  INR 1.8* 1.7*  --  1.5*  CREATININE  --   --  0.78 0.83    Estimated Creatinine Clearance: 54.9 mL/min (by C-G formula based on SCr of 0.83 mg/dL).  Assessment: 87 yo M on warfarin PTA for afib. Admit INR 7.7 on home dose of 5mg  daily. Pharmacy consulted to dose warfarin.   Patient with traumatic foley insertion and continued hematuria per RN. H/H, plt stable. INR 1.5. Per discussion with MD, will continue to hold warfarin until hematuria resolves.   Goal of Therapy:  INR 2-3 when hematuria resolves Monitor platelets by anticoagulation protocol: Yes   Plan:  Hold warfarin until hematuria resolves Monitor daily INR, CBC/plt Monitor for signs/symptoms of bleeding/hematuria  Romilda Garret, PharmD PGY1 Acute Care Pharmacy Resident 06/19/2020 8:04 AM  Please check [email or web address] for unit specific pharmacy phone numbers.

## 2020-06-20 DIAGNOSIS — I509 Heart failure, unspecified: Secondary | ICD-10-CM | POA: Diagnosis not present

## 2020-06-20 DIAGNOSIS — R310 Gross hematuria: Secondary | ICD-10-CM | POA: Diagnosis not present

## 2020-06-20 DIAGNOSIS — I482 Chronic atrial fibrillation, unspecified: Secondary | ICD-10-CM | POA: Diagnosis not present

## 2020-06-20 DIAGNOSIS — J101 Influenza due to other identified influenza virus with other respiratory manifestations: Secondary | ICD-10-CM | POA: Diagnosis not present

## 2020-06-20 LAB — CBC WITH DIFFERENTIAL/PLATELET
Abs Immature Granulocytes: 0.03 10*3/uL (ref 0.00–0.07)
Basophils Absolute: 0 10*3/uL (ref 0.0–0.1)
Basophils Relative: 1 %
Eosinophils Absolute: 0.1 10*3/uL (ref 0.0–0.5)
Eosinophils Relative: 1 %
HCT: 39.5 % (ref 39.0–52.0)
Hemoglobin: 13.1 g/dL (ref 13.0–17.0)
Immature Granulocytes: 1 %
Lymphocytes Relative: 20 %
Lymphs Abs: 1.2 10*3/uL (ref 0.7–4.0)
MCH: 31.2 pg (ref 26.0–34.0)
MCHC: 33.2 g/dL (ref 30.0–36.0)
MCV: 94 fL (ref 80.0–100.0)
Monocytes Absolute: 0.6 10*3/uL (ref 0.1–1.0)
Monocytes Relative: 10 %
Neutro Abs: 4.3 10*3/uL (ref 1.7–7.7)
Neutrophils Relative %: 67 %
Platelets: 186 10*3/uL (ref 150–400)
RBC: 4.2 MIL/uL — ABNORMAL LOW (ref 4.22–5.81)
RDW: 15.1 % (ref 11.5–15.5)
WBC: 6.3 10*3/uL (ref 4.0–10.5)
nRBC: 0 % (ref 0.0–0.2)

## 2020-06-20 LAB — BASIC METABOLIC PANEL
Anion gap: 5 (ref 5–15)
BUN: 24 mg/dL — ABNORMAL HIGH (ref 8–23)
CO2: 26 mmol/L (ref 22–32)
Calcium: 8.3 mg/dL — ABNORMAL LOW (ref 8.9–10.3)
Chloride: 104 mmol/L (ref 98–111)
Creatinine, Ser: 0.87 mg/dL (ref 0.61–1.24)
GFR, Estimated: >60 mL/min (ref >60)
Glucose, Bld: 91 mg/dL (ref 70–99)
Potassium: 4.1 mmol/L (ref 3.5–5.1)
Sodium: 135 mmol/L (ref 135–145)

## 2020-06-20 LAB — PROTIME-INR
INR: 1.3 — ABNORMAL HIGH (ref 0.8–1.2)
Prothrombin Time: 16.3 seconds — ABNORMAL HIGH (ref 11.4–15.2)

## 2020-06-20 LAB — MAGNESIUM: Magnesium: 2.3 mg/dL (ref 1.7–2.4)

## 2020-06-20 MED ORDER — WARFARIN SODIUM 5 MG PO TABS
5.0000 mg | ORAL_TABLET | Freq: Once | ORAL | Status: AC
  Administered 2020-06-20: 5 mg via ORAL
  Filled 2020-06-20: qty 1

## 2020-06-20 NOTE — Progress Notes (Signed)
ANTICOAGULATION CONSULT NOTE - Follow Up Consult  Pharmacy Consult for Coumadin Indication: atrial fibrillation  Allergies  Allergen Reactions  . No Known Allergies Other (See Comments)    NKA    Patient Measurements: Height: 5\' 8"  (172.7 cm) Weight: 74.3 kg (163 lb 12.8 oz) IBW/kg (Calculated) : 68.4  Vital Signs: Temp: 98 F (36.7 C) (05/08 0513) Temp Source: Oral (05/08 0513) BP: 134/66 (05/08 0513) Pulse Rate: 60 (05/08 0513)  Labs: Recent Labs    06/18/20 0517 06/18/20 0803 06/19/20 0301 06/20/20 0210  HGB  --  13.9  --  13.1  HCT  --  41.8  --  39.5  PLT  --  168  --  186  LABPROT 19.5*  --  18.1* 16.3*  INR 1.7*  --  1.5* 1.3*  CREATININE  --  0.78 0.83 0.87    Estimated Creatinine Clearance: 52.4 mL/min (by C-G formula based on SCr of 0.87 mg/dL).  Assessment: 87 yo M on warfarin PTA for afib. Admit INR 7.7 on home dose of 5mg  daily. Pharmacy consulted to dose warfarin.   Patient with traumatic foley insertion and hematuria. Per MD, hematuria has resolved, okay to resume warfarin. H/H, plt stable. INR 1.3.   Goal of Therapy:  INR 2-3 when hematuria resolves Monitor platelets by anticoagulation protocol: Yes   Plan:  Warfarin 5 mg x1 Monitor daily INR, CBC/plt Monitor for signs/symptoms of bleeding/hematuria  Romilda Garret, PharmD PGY1 Acute Care Pharmacy Resident 06/20/2020 9:25 AM  Please check [email or web address] for unit specific pharmacy phone numbers.

## 2020-06-20 NOTE — Progress Notes (Signed)
Progress Note  Patient Name: Tyler Williams Date of Encounter: 06/20/2020  [facility] Cardiologist: Sherren Mocha, MD   Subjective   NAEO. Hematuria resolved. Son at bedside.  Inpatient Medications    Scheduled Meds: . aspirin EC  81 mg Oral Daily  . atorvastatin  20 mg Oral Daily  . Chlorhexidine Gluconate Cloth  6 each Topical Daily  . fesoterodine  8 mg Oral QPM  . finasteride  5 mg Oral Daily  . furosemide  80 mg Oral Daily  . guaiFENesin  1,200 mg Oral BID  . hydrALAZINE  10 mg Oral Q8H  . isosorbide mononitrate  120 mg Oral Daily  . metoprolol succinate  25 mg Oral Daily  . polyethylene glycol  17 g Oral BID  . sacubitril-valsartan  1 tablet Oral BID  . tamsulosin  0.4 mg Oral Daily  . warfarin  5 mg Oral ONCE-1600  . Warfarin - Pharmacist Dosing Inpatient   Does not apply q1600   Continuous Infusions: . promethazine (PHENERGAN) injection (IM or IVPB) 12.5 mg (06/19/20 1441)   PRN Meds: HYDROmorphone (DILAUDID) injection, ipratropium-albuterol, LORazepam, nitroGLYCERIN, promethazine (PHENERGAN) injection (IM or IVPB)   Vital Signs    Vitals:   06/19/20 0917 06/19/20 1247 06/19/20 1944 06/20/20 0513  BP: (!) 153/76 (!) 123/57 132/64 134/66  Pulse: 73 70 71 60  Resp: 18 17 18 18   Temp:  97.9 F (36.6 C) 97.9 F (36.6 C) 98 F (36.7 C)  TempSrc:  Oral Oral Oral  SpO2: 96% 96% 96% 95%  Weight:    74.3 kg  Height:        Intake/Output Summary (Last 24 hours) at 06/20/2020 0946 Last data filed at 06/20/2020 0700 Gross per 24 hour  Intake 410 ml  Output 1500 ml  Net -1090 ml   Last 3 Weights 06/20/2020 06/19/2020 06/18/2020  Weight (lbs) 163 lb 12.8 oz 163 lb 5.8 oz 163 lb 12.8 oz  Weight (kg) 74.3 kg 74.1 kg 74.3 kg      Telemetry    reviewed - Personally Reviewed  ECG    No new - Personally Reviewed  Physical Exam   GEN: No acute distress.   Neck: No JVD Cardiac: RRR, no murmurs, rubs, or gallops.  Respiratory: Clear to auscultation  bilaterally. GI/GU: Soft, nontender, non-distended.  MS: No edema; No deformity. Neuro:  Nonfocal  Psych: Normal affect   Labs    High Sensitivity Troponin:   Recent Labs  Lab 06/12/20 0027 06/12/20 0239 06/12/20 0354 06/12/20 0532  TROPONINIHS 103* 117* 105* 111*      Chemistry Recent Labs  Lab 06/18/20 0803 06/19/20 0301 06/20/20 0210  NA 134* 133* 135  K 4.6 4.3 4.1  CL 103 100 104  CO2 25 24 26   GLUCOSE 99 97 91  BUN 19 30* 24*  CREATININE 0.78 0.83 0.87  CALCIUM 8.4* 8.2* 8.3*  GFRNONAA >60 >60 >60  ANIONGAP 6 9 5      Hematology Recent Labs  Lab 06/16/20 0436 06/18/20 0803 06/20/20 0210  WBC 4.7 6.3 6.3  RBC 3.90* 4.42 4.20*  HGB 12.2* 13.9 13.1  HCT 37.2* 41.8 39.5  MCV 95.4 94.6 94.0  MCH 31.3 31.4 31.2  MCHC 32.8 33.3 33.2  RDW 15.4 15.0 15.1  PLT 121* 168 186    BNPNo results for input(s): BNP, PROBNP in the last 168 hours.   DDimer No results for input(s): DDIMER in the last 168 hours.   Radiology    No  results found.  Cardiac Studies   No new  Patient Profile     87 y.o. male w CAD, perm AF on coumadin, chronic diastolic HF, AS s/p TAVR in 2018, AAA s/p repair, HTN, HLD who has a mildly decreased LV function on recent echo.  Assessment & Plan   #Chronic diastolic HF - earm and euvolemic on my exam today - cont lasix 80mg  PO daily - cont toprol, entresto, hydral and imdur  #Influenza A - abx per primary  #CAD - cont asa, bb, statin  #perm AF Coumadin restarted  #indwellin foley - follow up per IM and uro    For questions or updates, please contact [place] Please consult [email or web address] for contact info under        Signed, Vickie Epley, MD  06/20/2020, 9:46 AM

## 2020-06-20 NOTE — Progress Notes (Signed)
[facility] PROGRESS NOTE    Progress Note  Tyler Williams  AUQ:[id number] DOB: 11-21-1926 DOA: 06/11/2020 PCP: Geoffry Paradise, MD     Brief Narrative:   Tyler Williams is an 87 y.o. male past medical history significant for permanent atrial fibrillation on Coumadin, coronary artery disease, status post AAA repair, aortic stenosis status post TAVR was in 2018, chronic diastolic heart failure, comes into the ED for worsening productive cough that started 4 days prior to admission associated with lower extremity edema and generalized weakness.  In the ED BNP was 1200 chest x-ray showed bilateral pulmonary infiltrates influenza A was positive COVID-19 PCR was negative INR was 7.7 IV diuresis by the ED and cardiology has been consulted.   Assessment/Plan:   Acute on chronic combined systolic and diastolic heart failure: Likely secondary to noncompliance with diuretic. His diuresis is slowing down but still having urine output about 2 L.  Continue to monitor electrolytes and replete as needed continue strict I's and O's and daily weight start on Entresto on 06/17/2020 continue metoprolol and Hydralazine. Further management per cardiology I's and O's poorly recorded he had almost 2 L of can comes in fluid in the room this morning.  I have discussed with the nurse we need to limit his fluid intake.  Influenza A viral pneumonia: Tamiflu and admission for 5 days. He was started empirically on Rocephin his procalcitonin was 0.1 Discontinue  Rocephin.  Not have a bacterial infection on admission.  Elevated troponin/history of coronary artery disease In the setting of demand ischemia. Denies any chest pain.  Acute urinary retention with frank hematuria and UTI Urine culture not obtained on admission was given  Rocephin.  Paroxysmal atrial fibrillation on Coumadin There with gross hematuria mild drop in hemoglobin to 12, hold Coumadin allow INR to drift down until hematuria  has been cleared. Continue Toprol for rate control.  Essential hypertension Blood pressure is slowly improving, will resume his home medications.  Hypokalemia/hypomagnesemia: Likely due to diuresis replete and recheck.  Gross hematuria: They were unsuccessful in placing Foley, urology was consulted.  For Foley placement which was traumatic. INR This morning is 1.3. Hematuria has resolved can restart him back on his Coumadin.  DVT prophylaxis: Coumadin Family Communication:none Status is: Inpatient  Remains inpatient appropriate because:Hemodynamically unstable   Dispo: The patient is from: Home              Anticipated d/c is to: Home              Patient currently is not medically stable to d/c.   Difficult to place patient No        Code Status:     Code Status Orders  (From admission, onward)         Start     Ordered   06/12/20 0435  Full code  Continuous        06/12/20 0434        Code Status History    Date Active Date Inactive Code Status Order ID Comments User Context   08/28/2017 0337 09/01/2017 1420 Full Code [id number]  Clydie Braun, MD ED   06/13/2016 1646 06/15/2016 2028 Full Code [id number]  Purcell Nails, MD Inpatient   05/12/2016 1435 05/12/2016 2141 Full Code [id number]  Tonny Bollman, MD Inpatient   12/04/2012 0000 12/07/2012 1544 Full Code [id number]  Houston Siren, MD Inpatient   01/22/2012 1522 01/23/2012 1440 Full Code [id number]  Nilda Simmer, RN Inpatient  Advance Care Planning Activity        IV Access:    Peripheral IV   Procedures and diagnostic studies:   No results found.   Medical Consultants:    None.   Subjective:    [name] feels great no new complaints.  Objective:    Vitals:   06/19/20 0917 06/19/20 1247 06/19/20 1944 06/20/20 0513  BP: (!) 153/76 (!) 123/57 132/64 134/66  Pulse: 73 70 71 60  Resp: 18 17 18 18   Temp:  97.9 F (36.6 C) 97.9 F (36.6 C) 98 F (36.7 C)  TempSrc:  Oral  Oral Oral  SpO2: 96% 96% 96% 95%  Weight:    74.3 kg  Height:       SpO2: 95 %   Intake/Output Summary (Last 24 hours) at 06/20/2020 0714 Last data filed at 06/20/2020 0600 Gross per 24 hour  Intake 180 ml  Output 1700 ml  Net -1520 ml   Filed Weights   06/18/20 0315 06/19/20 0119 06/20/20 0513  Weight: 74.3 kg 74.1 kg 74.3 kg    Exam: General exam: In no acute distress. Respiratory system: Good air movement and clear to auscultation. Cardiovascular system: S1 & S2 heard, RRR. No JVD. Gastrointestinal system: Abdomen is nondistended, soft and nontender.  Extremities: No pedal edema. Skin: No rashes, lesions or ulcers  Data Reviewed:    Labs: Basic Metabolic Panel: Recent Labs  Lab 06/14/20 0420 06/15/20 0403 06/16/20 0436 06/17/20 0825 06/18/20 0803 06/19/20 0301 06/20/20 0210  NA 138 137 136  --  134* 133* 135  K 4.6 4.2 3.3*  --  4.6 4.3 4.1  CL 99 100 103  --  103 100 104  CO2 29 30 28   --  25 24 26   GLUCOSE 108* 104* 94  --  99 97 91  BUN 15 24* 22  --  19 30* 24*  CREATININE 0.90 0.90 0.75  --  0.78 0.83 0.87  CALCIUM 8.5* 8.4* 8.1*  --  8.4* 8.2* 8.3*  MG 2.1  --   --  2.0  --   --  2.3   GFR Estimated Creatinine Clearance: 52.4 mL/min (by C-G formula based on SCr of 0.87 mg/dL). Liver Function Tests: No results for input(s): AST, ALT, ALKPHOS, BILITOT, PROT, ALBUMIN in the last 168 hours. No results for input(s): LIPASE, AMYLASE in the last 168 hours. No results for input(s): AMMONIA in the last 168 hours. Coagulation profile Recent Labs  Lab 06/16/20 0436 06/17/20 0301 06/18/20 0517 06/19/20 0301 06/20/20 0210  INR 2.2* 1.8* 1.7* 1.5* 1.3*   COVID-19 Labs  No results for input(s): DDIMER, FERRITIN, LDH, CRP in the last 72 hours.  Lab Results  Component Value Date   [place] NEGATIVE 06/12/2020    CBC: Recent Labs  Lab 06/14/20 0420 06/15/20 0403 06/16/20 0436 06/18/20 0803 06/20/20 0210  WBC 7.2  --  4.7 6.3 6.3  NEUTROABS   --   --   --   --  4.3  HGB 14.7 13.0 12.2* 13.9 13.1  HCT 45.2 39.3 37.2* 41.8 39.5  MCV 95.4  --  95.4 94.6 94.0  PLT 107*  --  121* 168 186   Cardiac Enzymes: No results for input(s): CKTOTAL, CKMB, CKMBINDEX, TROPONINI in the last 168 hours. BNP (last 3 results) No results for input(s): PROBNP in the last 8760 hours. CBG: No results for input(s): GLUCAP in the last 168 hours. D-Dimer: No results for input(s): DDIMER in the last  72 hours. Hgb A1c: No results for input(s): HGBA1C in the last 72 hours. Lipid Profile: No results for input(s): CHOL, HDL, LDLCALC, TRIG, CHOLHDL, LDLDIRECT in the last 72 hours. Thyroid function studies: No results for input(s): TSH, T4TOTAL, T3FREE, THYROIDAB in the last 72 hours.  Invalid input(s): FREET3 Anemia work up: No results for input(s): VITAMINB12, FOLATE, FERRITIN, TIBC, IRON, RETICCTPCT in the last 72 hours. Sepsis Labs: Recent Labs  Lab 06/14/20 0420 06/16/20 0436 06/18/20 0803 06/20/20 0210  WBC 7.2 4.7 6.3 6.3   Microbiology Recent Results (from the past 240 hour(s))  Resp Panel by RT-PCR (Flu A&B, Covid) Nasopharyngeal Swab     Status: Abnormal   Collection Time: 06/12/20 12:27 AM   Specimen: Nasopharyngeal Swab; Nasopharyngeal(NP) swabs in vial transport medium  Result Value Ref Range Status   SARS Coronavirus 2 by RT PCR NEGATIVE NEGATIVE Final    Comment: (NOTE) SARS-CoV-2 target nucleic acids are NOT DETECTED.  The SARS-CoV-2 RNA is generally detectable in upper respiratory specimens during the acute phase of infection. The lowest concentration of SARS-CoV-2 viral copies this assay can detect is 138 copies/mL. A negative result does not preclude SARS-Cov-2 infection and should not be used as the sole basis for treatment or other patient management decisions. A negative result may occur with  improper specimen collection/handling, submission of specimen other than nasopharyngeal swab, presence of viral mutation(s)  within the areas targeted by this assay, and inadequate number of viral copies(<138 copies/mL). A negative result must be combined with clinical observations, patient history, and epidemiological information. The expected result is Negative.  Fact Sheet for Patients:  EntrepreneurPulse.com.au  Fact Sheet for Healthcare Providers:  IncredibleEmployment.be  This test is no t yet approved or cleared by the [place] FDA and  has been authorized for detection and/or diagnosis of SARS-CoV-2 by FDA under an Emergency Use Authorization (EUA). This EUA will remain  in effect (meaning this test can be used) for the duration of the COVID-19 declaration under Section 564(b)(1) of the Act, 21 U.S.C.section 360bbb-3(b)(1), unless the authorization is terminated  or revoked sooner.       Influenza A by PCR POSITIVE (A) NEGATIVE Final   Influenza B by PCR NEGATIVE NEGATIVE Final    Comment: (NOTE) The Xpert Xpress SARS-CoV-2/FLU/RSV plus assay is intended as an aid in the diagnosis of influenza from Nasopharyngeal swab specimens and should not be used as a sole basis for treatment. Nasal washings and aspirates are unacceptable for Xpert Xpress SARS-CoV-2/FLU/RSV testing.  Fact Sheet for Patients: EntrepreneurPulse.com.au  Fact Sheet for Healthcare Providers: IncredibleEmployment.be  This test is not yet approved or cleared by the [place] FDA and has been authorized for detection and/or diagnosis of SARS-CoV-2 by FDA under an Emergency Use Authorization (EUA). This EUA will remain in effect (meaning this test can be used) for the duration of the COVID-19 declaration under Section 564(b)(1) of the Act, 21 U.S.C. section 360bbb-3(b)(1), unless the authorization is terminated or revoked.  Performed at [facility], [place] [address] [place], [place] [address]   Culture, Urine     Status: None    Collection Time: 06/14/20  9:10 AM   Specimen: Urine, Random  Result Value Ref Range Status   Specimen Description URINE, RANDOM  Final   Special Requests NONE  Final   Culture   Final    NO GROWTH Performed at [facility], [place] [address], [place], [place] [address]    Report Status 06/15/2020 FINAL  Final     Medications:   . aspirin EC  81 mg Oral Daily  . atorvastatin  20 mg Oral Daily  . Chlorhexidine Gluconate Cloth  6 each Topical Daily  . fesoterodine  8 mg Oral QPM  . finasteride  5 mg Oral Daily  . furosemide  80 mg Oral Daily  . guaiFENesin  1,200 mg Oral BID  . hydrALAZINE  10 mg Oral Q8H  . isosorbide mononitrate  120 mg Oral Daily  . metoprolol succinate  25 mg Oral Daily  . polyethylene glycol  17 g Oral BID  . sacubitril-valsartan  1 tablet Oral BID  . tamsulosin  0.4 mg Oral Daily  . Warfarin - Pharmacist Dosing Inpatient   Does not apply q1600   Continuous Infusions: . promethazine (PHENERGAN) injection (IM or IVPB) 12.5 mg (06/19/20 1441)      LOS: 8 days   Charlynne Cousins  [facility]  06/20/2020, 7:14 AM

## 2020-06-21 ENCOUNTER — Other Ambulatory Visit (HOSPITAL_COMMUNITY): Payer: Self-pay

## 2020-06-21 DIAGNOSIS — I482 Chronic atrial fibrillation, unspecified: Secondary | ICD-10-CM | POA: Diagnosis not present

## 2020-06-21 DIAGNOSIS — I509 Heart failure, unspecified: Secondary | ICD-10-CM | POA: Diagnosis not present

## 2020-06-21 DIAGNOSIS — R310 Gross hematuria: Secondary | ICD-10-CM | POA: Diagnosis not present

## 2020-06-21 LAB — CBC WITH DIFFERENTIAL/PLATELET
Abs Immature Granulocytes: 0.04 10*3/uL (ref 0.00–0.07)
Basophils Absolute: 0 10*3/uL (ref 0.0–0.1)
Basophils Relative: 1 %
Eosinophils Absolute: 0.1 10*3/uL (ref 0.0–0.5)
Eosinophils Relative: 1 %
HCT: 40 % (ref 39.0–52.0)
Hemoglobin: 13.2 g/dL (ref 13.0–17.0)
Immature Granulocytes: 1 %
Lymphocytes Relative: 16 %
Lymphs Abs: 1.1 10*3/uL (ref 0.7–4.0)
MCH: 31.1 pg (ref 26.0–34.0)
MCHC: 33 g/dL (ref 30.0–36.0)
MCV: 94.3 fL (ref 80.0–100.0)
Monocytes Absolute: 0.7 10*3/uL (ref 0.1–1.0)
Monocytes Relative: 10 %
Neutro Abs: 4.8 10*3/uL (ref 1.7–7.7)
Neutrophils Relative %: 71 %
Platelets: 191 10*3/uL (ref 150–400)
RBC: 4.24 MIL/uL (ref 4.22–5.81)
RDW: 15 % (ref 11.5–15.5)
WBC: 6.6 10*3/uL (ref 4.0–10.5)
nRBC: 0 % (ref 0.0–0.2)

## 2020-06-21 LAB — PROTIME-INR
INR: 1.3 — ABNORMAL HIGH (ref 0.8–1.2)
Prothrombin Time: 15.8 seconds — ABNORMAL HIGH (ref 11.4–15.2)

## 2020-06-21 LAB — MAGNESIUM: Magnesium: 2.3 mg/dL (ref 1.7–2.4)

## 2020-06-21 MED ORDER — FINASTERIDE 5 MG PO TABS
5.0000 mg | ORAL_TABLET | Freq: Every day | ORAL | 0 refills | Status: AC
  Filled 2020-06-21: qty 30, 30d supply, fill #0

## 2020-06-21 MED ORDER — WARFARIN SODIUM 5 MG PO TABS: 2.5000 mg | ORAL_TABLET | Freq: Every day | ORAL | Status: DC

## 2020-06-21 MED ORDER — FUROSEMIDE 80 MG PO TABS
80.0000 mg | ORAL_TABLET | Freq: Every day | ORAL | 0 refills | Status: DC
  Filled 2020-06-21: qty 30, 30d supply, fill #0

## 2020-06-21 MED ORDER — SACUBITRIL-VALSARTAN 24-26 MG PO TABS
1.0000 | ORAL_TABLET | Freq: Two times a day (BID) | ORAL | 0 refills | Status: DC
Start: 2020-06-21 — End: 2020-07-22
  Filled 2020-06-21: qty 60, 30d supply, fill #0

## 2020-06-21 MED ORDER — TAMSULOSIN HCL 0.4 MG PO CAPS
0.4000 mg | ORAL_CAPSULE | Freq: Every day | ORAL | 3 refills | Status: AC
  Filled 2020-06-21: qty 30, 30d supply, fill #0

## 2020-06-21 NOTE — TOC Transition Note (Addendum)
Transition of Care [facility]) - CM/SW Discharge Note   Patient Details  Name: Orlan Aversa MRN: [id number] Date of Birth: Jul 11, 1926  [facility]) CM/SW Contact:  Zenon Mayo, RN Phone Number: 06/21/2020, 10:16 AM   Clinical Narrative:    Patient is for dc today, he will need HHPT, HHRN (foley care), he was active with [facility], would like to continue with them.  NCM made Stacie aware of patient's discharge today.  Per patient family members will transport him home.   Final next level of care: [place] Barriers to Discharge: No Barriers Identified   Patient Goals and CMS Choice Patient states their goals for this hospitalization and ongoing recovery are:: return to [place] living CMS Medicare.gov Compare Post Acute Care list provided to:: Patient Choice offered to / list presented to : Patient  Discharge Placement                       Discharge Plan and Services In-house Referral: Clinical Social Work Discharge Planning Services: CM Consult Post Acute Care Choice: Home Health          DME Arranged: N/A DME Agency: NA       HH Arranged: RN,PT [place] Agency: Other - See comment Secondary school teacher) Date HH Agency Contacted: 06/21/20 Time [place]: 1016 Representative spoke with at [place]: [place] ([place]) Interventions     Readmission Risk Interventions No flowsheet data found.

## 2020-06-21 NOTE — Progress Notes (Signed)
Physical Therapy Treatment Patient Details Name: Tyler Williams MRN: [id number] DOB: 10-May-1926 Today's Date: 06/21/2020    History of Present Illness The pt is a 87 yo male presenting 4/29 from [place] with BLE swelling and SOB that has been progressively worsening over last few days. Pt found to have elevated troponin, INR of 7.7, and the flu with PNA upon work up, admitted for management of troponin. PMH includes: afib, AAA s/p repair, arthritis, CAD, HTN, PVD, s/p TAVR (2018), and multiple joint replacements.    PT Comments    Patient progressing well towards PT goals. Noted to have a more guarded gait today with slower gait speed putting pt at increased risk for falls. Tolerated higher level balance challenges- head turns, stops, stepping over objects etc and noted to have mild deviations in gait with Min guard-Min A as well as needing to stop when scanning vertically. Difficulty with dual tasking at times. VSS on RA. Eager to return home. Continue to recommend HHPT to improve overall mobility, strength and higher level balance activities. Will follow.   Follow Up Recommendations  Home health PT;Supervision for mobility/OOB     Equipment Recommendations  None recommended by PT    Recommendations for Other Services       Precautions / Restrictions Precautions Precautions: Fall Precaution Comments: fell on 4/24 Restrictions Weight Bearing Restrictions: No    Mobility  Bed Mobility               General bed mobility comments: Sitting on EOB upon PT arrival.    Transfers Overall transfer level: Needs assistance Equipment used: Rolling walker (2 wheeled) Transfers: Sit to/from Stand Sit to Stand: Min assist;Min guard         General transfer comment: Min A from low toilet, Min guad from EOB with cues for hand placement. Transferred to chair post ambulation.  Ambulation/Gait Ambulation/Gait assistance: Min guard Gait Distance (Feet): 1000 Feet Assistive  device: Rolling walker (2 wheeled) Gait Pattern/deviations: Step-through pattern;Decreased stride length;Trunk flexed Gait velocity: 1.2 ft/sec Gait velocity interpretation: <1.31 ft/sec, indicative of household ambulator General Gait Details: Slow, guarded gait with decreased step lengths bilaterally. Tolerated dynamic gait tasks with mild deviations in gait but no overt LOB. VSS on RA. Stops when looking up.   Stairs             Wheelchair Mobility    Modified Rankin (Stroke Patients Only)       Balance Overall balance assessment: Needs assistance Sitting-balance support: No upper extremity supported;Feet supported Sitting balance-Leahy Scale: Good     Standing balance support: During functional activity Standing balance-Leahy Scale: Fair Standing balance comment: Able to stand at sink and wash hands with close Min guard, no UE support but needs UE support for longer distances/safety.             High level balance activites: Head turns;Sudden stops;Direction changes High Level Balance Comments: Tolerated above and also stepping over objects in hallway with [place]-Min A needing to completely stop task of walking to scan vertically.            Cognition Arousal/Alertness: Awake/alert Behavior During Therapy: WFL for tasks assessed/performed Overall Cognitive Status: No family/caregiver present to determine baseline cognitive functioning Area of Impairment: Memory                     Memory: Decreased short-term memory         General Comments: Able to recall where room is generally today, "i  turn a left down here somewhere right?"      Exercises      General Comments        Pertinent Vitals/Pain Pain Assessment: No/denies pain    Home Living                      Prior Function            PT Goals (current goals can now be found in the care plan section) Progress towards PT goals: Progressing toward goals     Frequency    Min 3X/week      PT Plan Current plan remains appropriate    Co-evaluation              AM-PAC PT "6 Clicks" Mobility   Outcome Measure  Help needed turning from your back to your side while in a flat bed without using bedrails?: None Help needed moving from lying on your back to sitting on the side of a flat bed without using bedrails?: None Help needed moving to and from a bed to a chair (including a wheelchair)?: A Little Help needed standing up from a chair using your arms (e.g., wheelchair or bedside chair)?: A Little Help needed to walk in hospital room?: A Little Help needed climbing 3-5 steps with a railing? : A Little 6 Click Score: 20    End of Session Equipment Utilized During Treatment: Gait belt Activity Tolerance: Patient tolerated treatment well Patient left: in chair;with call bell/phone within reach;with chair alarm set Nurse Communication: Mobility status PT Visit Diagnosis: Other abnormalities of gait and mobility (R26.89);Muscle weakness (generalized) (M62.81);History of falling (Z91.81)     Time: 0831-0900 PT Time Calculation (min) (ACUTE ONLY): 29 min  Charges:  $Gait Training: 8-22 mins $Therapeutic Activity: 8-22 mins                     Marisa Severin, PT, DPT Acute Rehabilitation Services Pager [phone] Office [place] 06/21/2020, 10:20 AM

## 2020-06-21 NOTE — TOC Benefit Eligibility Note (Signed)
Transition of Care [facility]) Benefit Eligibility Note    Patient Details  Name: Tyler Williams MRN: [id number] Date of Birth: August 12, 1926   Medication/Dose: [name]  24-26 MG BID  Covered?: Yes  Tier: 3 Drug  Prescription Coverage Preferred Pharmacy: [facility] with Person/Company/Phone Number:: MARK  @  [facility] NO #  [phone]  Co-Pay: $47.00  Prior Approval: No  Deductible:  (NO DEDUCTIBLE WITH PLAN  and  OUT-OF-POCKET :Linton Ham Phone Number: 06/21/2020, 12:08 PM

## 2020-06-21 NOTE — Progress Notes (Signed)
D/C instructions given and reviewed. Tele and IV removed, tolerated well. Awaiting family to transport to ALF.

## 2020-06-21 NOTE — Plan of Care (Signed)
  Problem: Pain Managment: Goal: General experience of comfort will improve Outcome: Completed/Met   Problem: Elimination: Goal: Will not experience complications related to bowel motility Outcome: Completed/Met   Problem: Coping: Goal: Level of anxiety will decrease Outcome: Completed/Met   Problem: Nutrition: Goal: Adequate nutrition will be maintained Outcome: Completed/Met   Problem: Activity: Goal: Risk for activity intolerance will decrease Outcome: Completed/Met

## 2020-06-21 NOTE — Care Management Important Message (Signed)
Important Message  Patient Details  Name: Tyler Williams MRN: [id number] Date of Birth: 10-25-1926   Medicare Important Message Given:  Yes     Shelda Altes 06/21/2020, 10:07 AM

## 2020-06-21 NOTE — TOC Progression Note (Addendum)
Transition of Care [facility]) - Progression Note    Patient Details  Name: Tyler Williams MRN: [id number] Date of Birth: 27-Jan-1927  Transition of [facility]) CM/SW Contact  Reece Agar, [place] Phone Number: 06/21/2020, 12:24 PM  Clinical Narrative:    11:34am- Pt will DC to [place] today, CSW spoke with Rollene Fare from [facility], she is waiting on pt covid but pt daughter is concerned about falling and does not think he will do well with foley if it is permanent because HH will be short term and they would have to have someone to come in and manage it for long term.  1:00pm- CSW followed up with Rollene Fare, foley is not permanent per MD DC summary, it is for 10 days until pt sees outpatient doctor. Rollene Fare will follow up with CSW after getting [facility] set [facility] at the facility so that pt can DC today.   4:00- CSW spoke with Rollene Fare and she stated pt could be dc today and they could [facility]. Pt already had [facility] set up and they would continue that for pt. CSW reached out to pt daughter and she wanted pt to go to SNF. CSW explained that pt would have to have orders from pt to go to a snf and insurance pay. CSW reached back out to pt and asked about eval and pt stated pt did well ambulating today and did not think pt needed snf. CSW shared information with pt daughter and she stated she would come and transport pt back to [facility] today. CSW contacted Rollene Fare to let her know pt would come back to day and sent over last pt note from today.  Expected Discharge Plan: [place] Barriers to Discharge: No Barriers Identified  Expected Discharge Plan and Services Expected Discharge Plan: [place] In-house Referral: Clinical Social Work Discharge Planning Services: CM Consult Post Acute Care Choice: [place] arrangements for the past 2 months: [place] Expected Discharge Date: 06/21/20                DME Arranged: N/A DME Agency: NA       HH Arranged: RN,PT [place] Agency: Other - See comment Secondary school teacher) Date HH Agency Contacted: 06/21/20 Time [place]: [place] Representative spoke with at [place]: [place] ([place]) Interventions    Readmission Risk Interventions No flowsheet data found.

## 2020-06-21 NOTE — Discharge Summary (Addendum)
Physician Discharge Summary  Tito Ausmus VZC:[id number] DOB: 1926/12/23 DOA: 06/11/2020  PCP: Burnard Bunting, MD  Admit date: 06/11/2020 Discharge date: 06/21/2020  Admitted From: ILF Disposition:  ILF  Recommendations for Outpatient Follow-up:  1. Follow up with Urology in 1-2 weeks 2. Please obtain BMP/CBC in one week 3. Follow-up with Coumadin clinic this week check a PT and INR. 4. Home health to check a PT and INR and sent to the Coumadin clinic  Home Health:Yes Equipment/Devices:None  Discharge Condition:Stable CODE STATUS:Full Diet recommendation: Heart Healthy  Brief/Interim Summary: 87 y.o. male past medical history significant for permanent atrial fibrillation on Coumadin, coronary artery disease, status post AAA repair, aortic stenosis status post TAVR was in 1287, chronic diastolic heart failure, comes into the ED for worsening productive cough that started 4 days prior to admission associated with lower extremity edema and generalized weakness.  In the ED BNP was 1200 chest x-ray showed bilateral pulmonary infiltrates influenza A was positive COVID-19 PCR was negative INR was 7.7 IV diuresis by the ED and cardiology has been consulted.  Discharge Diagnoses:  Active Problems:   Congestive heart failure (HCC)   DCM (dilated cardiomyopathy) (HCC)   Chronic atrial fibrillation (HCC)   Gross hematuria   Influenza A   Acute on chronic combined systolic and diastolic heart failure: Limited due to noncompliance with his diuretic therapy. He was started on IV Lasix and he diuresed over 12 L electrolytes were monitored and titrated as needed. His ACE inhibitor was discontinued he was continue metoprolol and hydralazine his blood pressure remained elevated he was started on Entresto which he tolerated well. He will follow-up with cardiology in 1 week.  Influenza A viral pneumonia: Treated with 5-day course of Tamiflu.  Elevated troponin/history of coronary artery  disease: In setting of demand ischemia he denies any chest pain.  Acute urinary retention with frank hematuria: He was started empirically on Rocephin, urine cultures were not obtained urology was consulted as we had a hard time placing a Foley they placed a Foley, urology recommended to leave the Foley in place 10 follow-up with them in 10 days.  He was also started on Flomax which she will continue as an outpatient.  Paroxysmal atrial fibrillation on Coumadin: He had gross hematuria there was a mild drop in hemoglobin, his INR was also elevated, Coumadin was held his hemoglobin dropped he remained rate controlled metoprolol. Was a gross hematuria resolved he was started back on his Coumadin. We will get a PT and INR on Wednesday with home health.  Essential hypertension: His ACE inhibitor was discontinued he was continue metoprolol and hydralazine Entresto was started remained stable continue current regimen.  Hypokalemia/hypomagnesemia: Likely due to diuresis these were repleted and recheck.  Gross hematuria: Likely due to traumatic Foley placement, urology was consulted recommended to leave the Foley in place for 10 days, his Coumadin was held INR drift down his hematuria resolved. He was started back on his Coumadin and his hematuria had resolved. He will follow-up with urology as an outpatient.    Discharge Instructions  Discharge Instructions    Diet - low sodium heart healthy   Complete by: As directed    Increase activity slowly   Complete by: As directed      Allergies as of 06/21/2020      Reactions   No Known Allergies Other (See Comments)   NKA      Medication List    STOP taking these medications   ramipril 10  MG capsule Commonly known as: ALTACE     TAKE these medications   aspirin EC 81 MG tablet Take 81 mg by mouth daily.   atorvastatin 20 MG tablet Commonly known as: LIPITOR TAKE 1 TABLET ONCE DAILY. What changed: when to take this   CAL-MAG-ZINC  PO Take 1 tablet by mouth daily.   CoQ10 100 MG Caps Take 100 mg by mouth daily.   fesoterodine 8 MG Tb24 tablet Commonly known as: TOVIAZ Take 8 mg by mouth every evening.   finasteride 5 MG tablet Commonly known as: PROSCAR Take 1 tablet (5 mg total) by mouth daily.   furosemide 80 MG tablet Commonly known as: LASIX Take 1 tablet (80 mg total) by mouth daily.   isosorbide mononitrate 60 MG 24 hr tablet Commonly known as: IMDUR TAKE (2) TABLETS BY MOUTH DAILY. What changed: See the new instructions.   LORazepam 1 MG tablet Commonly known as: ATIVAN Take 1 mg by mouth at bedtime.   metoprolol succinate 25 MG 24 hr tablet Commonly known as: TOPROL-XL Take 1 tablet (25 mg total) by mouth daily.   nitroGLYCERIN 0.4 MG SL tablet Commonly known as: NITROSTAT Place 0.4 mg under the tongue every 5 (five) minutes as needed for chest pain.   polyethylene glycol powder 17 GM/SCOOP powder Commonly known as: GLYCOLAX/MIRALAX Take 17 g by mouth daily as needed for mild constipation.   PreserVision AREDS Caps Take 1 tablet by mouth daily.   ranibizumab 0.5 MG/0.05ML Soln Commonly known as: LUCENTIS 0.5 mg by Intravitreal route See admin instructions. Every 9 weeks   sacubitril-valsartan 24-26 MG Commonly known as: ENTRESTO Take 1 tablet by mouth 2 (two) times daily.   SYSTANE OP Place 2 drops into both eyes 2 (two) times daily as needed.   tamsulosin 0.4 MG Caps capsule Commonly known as: FLOMAX Take 1 capsule (0.4 mg total) by mouth daily.   Vitamin D3 125 MCG (5000 UT) Tabs Take 5,000 Units by mouth daily.   warfarin 5 MG tablet Commonly known as: COUMADIN Take as directed. If you are unsure how to take this medication, talk to your nurse or doctor. Original instructions: Take 0.5 tablets (2.5 mg total) by mouth daily. What changed: how much to take       Follow-up Information    [facility].   Specialty: Cardiology Why: INR  Check on 06/22/2020 at 11:30 AM.  Contact information: [address], [place] [address] [place] Follow up in 1 week(s).   Contact information: [place] [phone]             Allergies  Allergen Reactions  . No Known Allergies Other (See Comments)    NKA    Consultations:  Cardiology  Urology   Procedures/Studies: DG Chest 2 View  Result Date: 06/15/2020 CLINICAL DATA:  History of congestive failure EXAM: CHEST - 2 VIEW COMPARISON:  06/12/2020 FINDINGS: Cardiac shadow remains enlarged. Changes of prior TAVR are noted. Aortic calcifications are again seen. The lungs are clear. Somewhat ovoid density is noted over the left apex which may artifactual in nature and extrinsic to the patient. This was not seen on the lateral projection nor on the most recent exam from 3 days previous. No bony abnormality is noted. Small effusions are seen bilaterally. IMPRESSION: Small posterior effusions. Ovoid density overlying the anterior aspect of the  left third rib which is felt to be artifactual in nature as it is not seen on the lateral projection and was not present on the film from 3 days previous. Electronically Signed   By: Inez Catalina M.D.   On: 06/15/2020 12:45   DG Chest Port 1 View  Result Date: 06/12/2020 CLINICAL DATA:  Shortness of breath. EXAM: PORTABLE CHEST 1 VIEW COMPARISON:  None. FINDINGS: Decreased lung volumes are seen which is, in part, secondary to the degree of patient inspiration. Mild to moderate severity areas of atelectasis and/or infiltrate are seen within the bilateral lung bases, left greater than right. A thin walled parenchymal cyst is noted within the lateral aspect of the right lung base. There is no evidence of a pleural effusion or pneumothorax. There is mild to moderate severity enlargement of the cardiac silhouette. An artificial aortic valve  is seen with marked severity calcification of the aortic arch. The visualized skeletal structures are unremarkable. IMPRESSION: Stable cardiomegaly with mild to moderate severity bibasilar atelectasis and/or infiltrate. Electronically Signed   By: Virgina Norfolk M.D.   On: 06/12/2020 01:50   ECHOCARDIOGRAM COMPLETE  Result Date: 06/12/2020    ECHOCARDIOGRAM REPORT   Patient Name:   HARDWICK STREIFEL Date of Exam: 06/12/2020 Medical Rec #:  [id number]     Height:       68.0 in Accession #:    [id number]    Weight:       185.0 lb Date of Birth:  September 30, 1926      BSA:          1.977 m Patient Age:    11 years      BP:           147/75 mmHg Patient Gender: M             HR:           71 bpm. Exam Location:  Inpatient Procedure: 2D Echo, Cardiac Doppler and Color Doppler Indications:    [id number] Acute diastolic (congestive) heart failure  History:        Patient has prior history of Echocardiogram examinations, most                 recent 07/29/2017. 29 mm Edwards bioprosthetic valve in the                 aortic position.                 Aortic Valve: 29 mm Sapien prosthetic, stented (TAVR) valve is                 present in the aortic position.  Sonographer:    Merrie Roof RDCS Referring Phys: [id number] [place]  1. Left ventricular ejection fraction, by estimation, is 45 to 50%. The left ventricle has mildly decreased function. The left ventricle demonstrates regional wall motion abnormalities (see scoring diagram/findings for description). There is mild concentric left ventricular hypertrophy. Left ventricular diastolic parameters are consistent with Grade II diastolic dysfunction (pseudonormalization). Elevated left atrial pressure. There is moderate hypokinesis of the left ventricular, basal-mid inferior wall and inferolateral wall.  2. Right ventricular systolic function is mildly reduced. The right ventricular size is normal. There is mildly elevated pulmonary artery systolic pressure.  3. Left atrial  size was severely dilated.  4. Right atrial size was moderately dilated.  5. The mitral valve is normal in structure. Mild to moderate mitral valve regurgitation.  6. The aortic valve  has been repaired/replaced. Aortic valve regurgitation is trivial. No aortic stenosis is present. There is a 29 mm Sapien prosthetic (TAVR) valve present in the aortic position. Aortic valve mean gradient measures 6.0 mmHg. Aortic valve Vmax measures 1.82 m/s.  7. The inferior vena cava is dilated in size with <50% respiratory variability, suggesting right atrial pressure of 15 mmHg. Comparison(s): Prior images unable to be directly viewed, comparison made by report only. The left ventricular function is worsened. The left ventricular wall motion abnormality is unchanged. FINDINGS  Left Ventricle: Left ventricular ejection fraction, by estimation, is 45 to 50%. The left ventricle has mildly decreased function. The left ventricle demonstrates regional wall motion abnormalities. Moderate hypokinesis of the left ventricular, basal-mid inferior wall and inferolateral wall. The left ventricular internal cavity size was normal in size. There is mild concentric left ventricular hypertrophy. Abnormal (paradoxical) septal motion consistent with post-operative status. Left ventricular diastolic parameters are consistent with Grade II diastolic dysfunction (pseudonormalization). Elevated left atrial pressure. Right Ventricle: The right ventricular size is normal. No increase in right ventricular wall thickness. Right ventricular systolic function is mildly reduced. There is mildly elevated pulmonary artery systolic pressure. The tricuspid regurgitant velocity  is 2.56 m/s, and with an assumed right atrial pressure of 15 mmHg, the estimated right ventricular systolic pressure is [id number] mmHg. Left Atrium: Left atrial size was severely dilated. Right Atrium: Right atrial size was moderately dilated. Pericardium: There is no evidence of pericardial  effusion. Mitral Valve: The mitral valve is normal in structure. Mild to moderate mitral annular calcification. Mild to moderate mitral valve regurgitation, with centrally-directed jet. Tricuspid Valve: The tricuspid valve is normal in structure. Tricuspid valve regurgitation is mild. Aortic Valve: The aortic valve has been repaired/replaced. Aortic valve regurgitation is trivial. Aortic regurgitation PHT measures 422 msec. No aortic stenosis is present. Aortic valve mean gradient measures 6.0 mmHg. Aortic valve peak gradient measures  13.2 mmHg. Aortic valve area, by VTI measures 1.31 cm. There is a 29 mm Sapien prosthetic, stented (TAVR) valve present in the aortic position. Pulmonic Valve: The pulmonic valve was normal in structure. Pulmonic valve regurgitation is not visualized. Aorta: The aortic root and ascending aorta are structurally normal, with no evidence of dilitation. Venous: The inferior vena cava is dilated in size with less than 50% respiratory variability, suggesting right atrial pressure of 15 mmHg. IAS/Shunts: No atrial level shunt detected by color flow Doppler.  LEFT VENTRICLE PLAX 2D LVIDd:         4.80 cm LVIDs:         3.80 cm LV PW:         1.40 cm LV IVS:        1.00 cm LVOT diam:     2.20 cm LV SV:         41 LV SV Index:   21 LVOT Area:     3.80 cm  RIGHT VENTRICLE            IVC RV Basal diam:  3.70 cm    IVC diam: 3.10 cm RV S prime:     6.85 cm/s TAPSE (M-mode): 1.0 cm LEFT ATRIUM            Index       RIGHT ATRIUM           Index LA diam:      5.70 cm  2.88 cm/m  RA Area:     29.30 cm LA Vol (A4C): 114.0 ml 57.66 ml/m RA  Volume:   90.60 ml  45.82 ml/m  AORTIC VALVE AV Area (Vmax):    1.38 cm AV Area (Vmean):   1.39 cm AV Area (VTI):     1.31 cm AV Vmax:           182.00 cm/s AV Vmean:          115.000 cm/s AV VTI:            0.316 m AV Peak Grad:      13.2 mmHg AV Mean Grad:      6.0 mmHg LVOT Vmax:         66.13 cm/s LVOT Vmean:        41.933 cm/s LVOT VTI:          0.109 m  LVOT/AV VTI ratio: 0.34 AI PHT:            422 msec  AORTA Ao Root diam: 3.20 cm Ao Asc diam:  3.30 cm TRICUSPID VALVE TR Peak grad:   26.2 mmHg TR Vmax:        256.00 cm/s  SHUNTS Systemic VTI:  0.11 m Systemic Diam: 2.20 cm Mihai Croitoru MD Electronically signed by Sanda Klein MD Signature Date/Time: 06/12/2020/5:57:11 PM    Final    (Echo, Carotid, EGD, Colonoscopy, ERCP)    Subjective: No new complaints feels great  Discharge Exam: Vitals:   06/20/20 2016 06/21/20 0437  BP: 132/62 (!) 121/57  Pulse: (!) 57 64  Resp: 18 18  Temp: 97.9 F (36.6 C) (!) 97.5 F (36.4 C)  SpO2: 98% 99%   Vitals:   06/20/20 1139 06/20/20 1544 06/20/20 2016 06/21/20 0437  BP: (!) 95/50 (!) 103/52 132/62 (!) 121/57  Pulse: (!) 58  (!) 57 64  Resp: 18  18 18   Temp: 97.6 F (36.4 C)  97.9 F (36.6 C) (!) 97.5 F (36.4 C)  TempSrc: Oral  Oral Oral  SpO2: 99%  98% 99%  Weight:    72.2 kg  Height:        General: Pt is alert, awake, not in acute distress Cardiovascular: RRR, S1/S2 +, no rubs, no gallops Respiratory: CTA bilaterally, no wheezing, no rhonchi Abdominal: Soft, NT, ND, bowel sounds + Extremities: no edema, no cyanosis    The results of significant diagnostics from this hospitalization (including imaging, microbiology, ancillary and laboratory) are listed below for reference.     Microbiology: Recent Results (from the past 240 hour(s))  Resp Panel by RT-PCR (Flu A&B, Covid) Nasopharyngeal Swab     Status: Abnormal   Collection Time: 06/12/20 12:27 AM   Specimen: Nasopharyngeal Swab; Nasopharyngeal(NP) swabs in vial transport medium  Result Value Ref Range Status   SARS Coronavirus 2 by RT PCR NEGATIVE NEGATIVE Final    Comment: (NOTE) SARS-CoV-2 target nucleic acids are NOT DETECTED.  The SARS-CoV-2 RNA is generally detectable in upper respiratory specimens during the acute phase of infection. The lowest concentration of SARS-CoV-2 viral copies this assay can detect  is 138 copies/mL. A negative result does not preclude SARS-Cov-2 infection and should not be used as the sole basis for treatment or other patient management decisions. A negative result may occur with  improper specimen collection/handling, submission of specimen other than nasopharyngeal swab, presence of viral mutation(s) within the areas targeted by this assay, and inadequate number of viral copies(<138 copies/mL). A negative result must be combined with clinical observations, patient history, and epidemiological information. The expected result is Negative.  Fact Sheet for Patients:  EntrepreneurPulse.com.au  Fact Sheet for Healthcare Providers:  IncredibleEmployment.be  This test is no t yet approved or cleared by the [place] FDA and  has been authorized for detection and/or diagnosis of SARS-CoV-2 by FDA under an Emergency Use Authorization (EUA). This EUA will remain  in effect (meaning this test can be used) for the duration of the COVID-19 declaration under Section 564(b)(1) of the Act, 21 U.S.C.section 360bbb-3(b)(1), unless the authorization is terminated  or revoked sooner.       Influenza A by PCR POSITIVE (A) NEGATIVE Final   Influenza B by PCR NEGATIVE NEGATIVE Final    Comment: (NOTE) The Xpert Xpress SARS-CoV-2/FLU/RSV plus assay is intended as an aid in the diagnosis of influenza from Nasopharyngeal swab specimens and should not be used as a sole basis for treatment. Nasal washings and aspirates are unacceptable for Xpert Xpress SARS-CoV-2/FLU/RSV testing.  Fact Sheet for Patients: EntrepreneurPulse.com.au  Fact Sheet for Healthcare Providers: IncredibleEmployment.be  This test is not yet approved or cleared by the [place] FDA and has been authorized for detection and/or diagnosis of SARS-CoV-2 by FDA under an Emergency Use Authorization (EUA). This EUA will remain in  effect (meaning this test can be used) for the duration of the COVID-19 declaration under Section 564(b)(1) of the Act, 21 U.S.C. section 360bbb-3(b)(1), unless the authorization is terminated or revoked.  Performed at [facility], [place] [address], [place], [place] [address]   Culture, Urine     Status: None   Collection Time: 06/14/20  9:10 AM   Specimen: Urine, Random  Result Value Ref Range Status   Specimen Description URINE, RANDOM  Final   Special Requests NONE  Final   Culture   Final    NO GROWTH Performed at [facility], [place] [address], [place], [place] [address]    Report Status 06/15/2020 FINAL  Final     Labs: BNP (last 3 results) Recent Labs    06/12/20 0027  BNP [id number]   Basic Metabolic Panel: Recent Labs  Lab 06/15/20 0403 06/16/20 0436 06/17/20 0825 06/18/20 0803 06/19/20 0301 06/20/20 0210  NA 137 136  --  134* 133* 135  K 4.2 3.3*  --  4.6 4.3 4.1  CL 100 103  --  103 100 104  CO2 30 28  --  25 24 26   GLUCOSE 104* 94  --  99 97 91  BUN 24* 22  --  19 30* 24*  CREATININE 0.90 0.75  --  0.78 0.83 0.87  CALCIUM 8.4* 8.1*  --  8.4* 8.2* 8.3*  MG  --   --  2.0  --   --  2.3   Liver Function Tests: No results for input(s): AST, ALT, ALKPHOS, BILITOT, PROT, ALBUMIN in the last 168 hours. No results for input(s): LIPASE, AMYLASE in the last 168 hours. No results for input(s): AMMONIA in the last 168 hours. CBC: Recent Labs  Lab 06/15/20 0403 06/16/20 0436 06/18/20 0803 06/20/20 0210  WBC  --  4.7 6.3 6.3  NEUTROABS  --   --   --  4.3  HGB 13.0 12.2* 13.9 13.1  HCT 39.3 37.2* 41.8 39.5  MCV  --  95.4 94.6 94.0  PLT  --  121* 168 186   Cardiac Enzymes: No results for input(s): CKTOTAL, CKMB, CKMBINDEX, TROPONINI in the last 168 hours. BNP: Invalid input(s): POCBNP CBG: No results for input(s): GLUCAP in the last 168 hours. D-Dimer No results for input(s): DDIMER in the  last 72 hours. Hgb A1c No results for input(s):  HGBA1C in the last 72 hours. Lipid Profile No results for input(s): CHOL, HDL, LDLCALC, TRIG, CHOLHDL, LDLDIRECT in the last 72 hours. Thyroid function studies No results for input(s): TSH, T4TOTAL, T3FREE, THYROIDAB in the last 72 hours.  Invalid input(s): FREET3 Anemia work up No results for input(s): VITAMINB12, FOLATE, FERRITIN, TIBC, IRON, RETICCTPCT in the last 72 hours. Urinalysis    Component Value Date/Time   COLORURINE RED (A) 06/13/2020 1335   APPEARANCEUR CLOUDY (A) 06/13/2020 1335   LABSPEC 1.010 06/13/2020 1335   PHURINE 6.5 06/13/2020 1335   GLUCOSEU NEGATIVE 06/13/2020 1335   HGBUR LARGE (A) 06/13/2020 1335   BILIRUBINUR NEGATIVE 06/13/2020 1335   KETONESUR 15 (A) 06/13/2020 1335   PROTEINUR >300 (A) 06/13/2020 1335   UROBILINOGEN 0.2 09/27/2013 2341   NITRITE POSITIVE (A) 06/13/2020 1335   LEUKOCYTESUR MODERATE (A) 06/13/2020 1335   Sepsis Labs Invalid input(s): PROCALCITONIN,  WBC,  LACTICIDVEN Microbiology Recent Results (from the past 240 hour(s))  Resp Panel by RT-PCR (Flu A&B, Covid) Nasopharyngeal Swab     Status: Abnormal   Collection Time: 06/12/20 12:27 AM   Specimen: Nasopharyngeal Swab; Nasopharyngeal(NP) swabs in vial transport medium  Result Value Ref Range Status   SARS Coronavirus 2 by RT PCR NEGATIVE NEGATIVE Final    Comment: (NOTE) SARS-CoV-2 target nucleic acids are NOT DETECTED.  The SARS-CoV-2 RNA is generally detectable in upper respiratory specimens during the acute phase of infection. The lowest concentration of SARS-CoV-2 viral copies this assay can detect is 138 copies/mL. A negative result does not preclude SARS-Cov-2 infection and should not be used as the sole basis for treatment or other patient management decisions. A negative result may occur with  improper specimen collection/handling, submission of specimen other than nasopharyngeal swab, presence of viral mutation(s) within the areas targeted by this assay, and  inadequate number of viral copies(<138 copies/mL). A negative result must be combined with clinical observations, patient history, and epidemiological information. The expected result is Negative.  Fact Sheet for Patients:  EntrepreneurPulse.com.au  Fact Sheet for Healthcare Providers:  IncredibleEmployment.be  This test is no t yet approved or cleared by the [place] FDA and  has been authorized for detection and/or diagnosis of SARS-CoV-2 by FDA under an Emergency Use Authorization (EUA). This EUA will remain  in effect (meaning this test can be used) for the duration of the COVID-19 declaration under Section 564(b)(1) of the Act, 21 U.S.C.section 360bbb-3(b)(1), unless the authorization is terminated  or revoked sooner.       Influenza A by PCR POSITIVE (A) NEGATIVE Final   Influenza B by PCR NEGATIVE NEGATIVE Final    Comment: (NOTE) The Xpert Xpress SARS-CoV-2/FLU/RSV plus assay is intended as an aid in the diagnosis of influenza from Nasopharyngeal swab specimens and should not be used as a sole basis for treatment. Nasal washings and aspirates are unacceptable for Xpert Xpress SARS-CoV-2/FLU/RSV testing.  Fact Sheet for Patients: EntrepreneurPulse.com.au  Fact Sheet for Healthcare Providers: IncredibleEmployment.be  This test is not yet approved or cleared by the [place] FDA and has been authorized for detection and/or diagnosis of SARS-CoV-2 by FDA under an Emergency Use Authorization (EUA). This EUA will remain in effect (meaning this test can be used) for the duration of the COVID-19 declaration under Section 564(b)(1) of the Act, 21 U.S.C. section 360bbb-3(b)(1), unless the authorization is terminated or revoked.  Performed at [facility], [place] [address] [place], [place]  [address]   Culture, Urine     Status: None   Collection Time: 06/14/20  9:10 AM   Specimen: Urine,  Random  Result Value Ref Range Status   Specimen Description URINE, RANDOM  Final   Special Requests NONE  Final   Culture   Final    NO GROWTH Performed at [facility], [address] [place], [place] [address]    Report Status 06/15/2020 FINAL  Final     Time coordinating discharge: Over 30 minutes  SIGNED:   Charlynne Cousins, MD  [facility] 06/21/2020, 8:01 AM Pager   If 7PM-7AM, please contact night-coverage [email or web address] Password [email or web address]

## 2020-06-21 NOTE — Plan of Care (Signed)
  Problem: Acute Rehab PT Goals(only PT should resolve) Goal: Pt Will Go Supine/Side To Sit Outcome: Adequate for Discharge Goal: Patient Will Transfer Sit To/From Stand Outcome: Adequate for Discharge Goal: Pt Will Ambulate Outcome: Adequate for Discharge Goal: Pt Will Go Up/Down Stairs Outcome: Adequate for Discharge Goal: Pt/caregiver will Perform Home Exercise Program Outcome: Adequate for Discharge   Problem: Education: Goal: Ability to demonstrate management of disease process will improve Outcome: Adequate for Discharge Goal: Ability to verbalize understanding of medication therapies will improve Outcome: Adequate for Discharge Goal: Individualized Educational Video(s) Outcome: Adequate for Discharge

## 2020-06-21 NOTE — Progress Notes (Signed)
Heart Failure Nurse Navigator Progress Note  Arranged HV TOC appt for 5/16 @ 2pm, confirmed with Dr. Burt Knack to scheduled as his f/u is scheduled 07/21/20. Pt has issues with medication noncompliance. hopsitalization complicated by +infuluenza A and hematuria with foley in place for 10 days-urology consulted, f/u OP. Pt coumadin restarted, INR check Friday.   Confirmed transportation.   Pricilla Holm, RN, BSN Heart Failure Nurse Navigator [phone]

## 2020-06-23 ENCOUNTER — Ambulatory Visit (INDEPENDENT_AMBULATORY_CARE_PROVIDER_SITE_OTHER): Payer: Medicare Other | Admitting: *Deleted

## 2020-06-23 DIAGNOSIS — I4891 Unspecified atrial fibrillation: Secondary | ICD-10-CM | POA: Diagnosis not present

## 2020-06-23 DIAGNOSIS — I4821 Permanent atrial fibrillation: Secondary | ICD-10-CM

## 2020-06-23 DIAGNOSIS — Z7982 Long term (current) use of aspirin: Secondary | ICD-10-CM | POA: Diagnosis not present

## 2020-06-23 DIAGNOSIS — S31010D Laceration without foreign body of lower back and pelvis without penetration into retroperitoneum, subsequent encounter: Secondary | ICD-10-CM | POA: Diagnosis not present

## 2020-06-23 DIAGNOSIS — Z5181 Encounter for therapeutic drug level monitoring: Secondary | ICD-10-CM | POA: Diagnosis not present

## 2020-06-23 DIAGNOSIS — I739 Peripheral vascular disease, unspecified: Secondary | ICD-10-CM | POA: Diagnosis not present

## 2020-06-23 DIAGNOSIS — S51811D Laceration without foreign body of right forearm, subsequent encounter: Secondary | ICD-10-CM | POA: Diagnosis not present

## 2020-06-23 DIAGNOSIS — I10 Essential (primary) hypertension: Secondary | ICD-10-CM | POA: Diagnosis not present

## 2020-06-23 LAB — POCT INR: INR: 1.3 — AB (ref 2.0–3.0)

## 2020-06-25 NOTE — Progress Notes (Signed)
[facility] Transitions of Care Clinic  PCP: Burnard Bunting Primary Cardiologist: Sherren Mocha  HPI: [name] was referred to the [facility] clinic by Dr Olevia Bowens for heart failure consultation.   Tyler Williams is a 87 y.o.  male  with a PMH significant for CAD s/p BA to RCA in the past, permanent atrial fibrillation on chronic anticoagulation on coumadin, chronic diastolic CHF, severe AS s/p TAVR 2018, AAA s/p repair, HTN, HLD and chronic venous insufficiency.   Known CAD and permanent afib for years on coumadin, Preserved EF earliest ECHO in our records from 2012 EF 60% and mild AS.  Had  [place] in 2014 with heavily calcified RCA disease Severe Stenosis, mild lcx and LAD disease, also had ECHO which demonstrated EF 65-70%, mild AS.    Repeat LHC in 2018 with similar severe stenosis and heavily calcified RCA disease, mild lcx and LAD disease and by Cath severe aortic stenosis.  ECHO TTE and TEE followed which also demonstrated preserved EF and Severe Aortic stenosis. S/P TAVR.   Developed chest pain a year later and underwent repeat ischemic evaluation in 2019 by [place] which showed severe mid RCA stenosis which was medically managed after unsuccessful PCI attempt due to small vessel size, otherwise 30% LM stenosis. Echo in 2019 showed EF 55-60%, mild LVH, akinesis of the basal inferolateral myocardium, stable aortic valve s/p TAVR, severe LAE, and moderate RAE.  Seen by Dr. Burt Knack 03/2020, at which time he had no complaints of chest pain or SOB, however did note chronic LE edema and more recent dizziness.  His amlodipine was discontinued at that visit in an effort to improve his LE edema and he was recommended to follow-up in 4 months.   Presented to [facility] ED 06/11/20 with  hypertensive and tachypneic. BNP 1200. COVID-19 negative, influenza A positive. Placed on Tamiflu. He was started on IV lasix 20mg  BID for management of his CHF. Repeat echo showed EF  45-50%, RWMA in basal-mid, inferior, and inferolateral wall unchanged from previous, mild concentric LVH, G2DD, mildly reduced RV systolic function, severe LAE, moderate RAE, mild-moderate MR, and stable aortic valve s/p TAVR. Hospitalization complicated by urinary retention. Once diuresed, he was placed back on PO diuretics, Lasix 80 mg daily. Discharged with foley catheter in place.  Also continued on toprol, entresto, hydralazine and imdur. D/w wt 163 lb. Discharged to [place] with home health.   He presents to [facility] clinic today for post hospital f/u with his daughter. Overall feeling ok but feels like he is on to much lasix. Doesn't like foley catheter. Mild SOB with exertion. Denies PND/Orthopnea. Appetite ok. No fever or chills. Weight has been trending down . Taking all medications. Currently at [place] with home health. Followed by [facility].   Cardiac Testing:  05/2020 EF 45-50% Grade II DD, RV mildly reduced   ROS: All systems negative except as listed in HPI, PMH and Problem List.  SH:  Social History   Socioeconomic History  . Marital status: Widowed    Spouse name: Not on file  . Number of children: Not on file  . Years of education: Not on file  . Highest education level: Not on file  Occupational History  . Not on file  Tobacco Use  . Smoking status: Never Smoker  . Smokeless tobacco: Never Used  Vaping Use  . Vaping Use: Never used  Substance and Sexual Activity  . Alcohol use:  Not Currently    Comment: patient doesn't drink anymore  . Drug use: Never  . Sexual activity: Not on file  Other Topics Concern  . Not on file  Social History Narrative   ** Merged History Encounter **       [employer]; played football; score touchdown against [employer] Determinants of Health   Financial Resource Strain: Not on file  Food Insecurity: Not on file  Transportation Needs: Not on file  Physical Activity:  Not on file  Stress: Not on file  Social Connections: Not on file  Intimate Partner Violence: Not on file    FH:  Family History  Problem Relation Age of Onset  . Heart attack Mother 60    Past Medical History:  Diagnosis Date  . A-fib ([place])   . AAA (abdominal aortic aneurysm) ([place])    s/p repair  . Aortic stenosis, mild    a. mean gradient 17 mmHg on 04/2012 TTE  . Arthritis    knees  . CAD (coronary artery disease)    a. s/p multiple percutaneous prior coronary artery interventions b. 05/2012: unsuccessful PCI to tight RCA->medically managed  . Cancer [facility])    skin cancer removed, right hand, left leg, chest  . Carotid artery disease ([place])    0-39% bilateral ICA stenoses 11/2011  . Chronic anticoagulation    Coumadin  . Coronary artery disease   . GERD (gastroesophageal reflux disease)   . GERD (gastroesophageal reflux disease)   . History of pulmonary embolism    1964 related to dislocated hip on right   . History of skin cancer   . Hypercholesteremia   . Hypertension   . Incidental cecal mass noted on CT imaging 05/24/2016   **An incidental finding of potential clinical significance has been found. 4.6 x 5.6 x 3.7 cm masslike lesion in the cecum adjacent to the ileocecal valve may represent a large polyp or colonic neoplasm. Correlation with nonemergent colonoscopy is strongly recommended in the near future to better evaluate this finding.**  . Macular degeneration    eye injections  . Microscopic hematuria    Followed by urology  . Permanent atrial fibrillation ([place])   . PVD (peripheral vascular disease) ([place])    Bilateral renal artery atherosclerosis, SMA stenosis with collateralization  . RBBB    Chronic  . 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 SSS (sick sinus syndrome) ([facility])     Current Outpatient Medications  Medication Sig  Dispense Refill  . atorvastatin (LIPITOR) 20 MG tablet TAKE 1 TABLET ONCE DAILY. (Patient taking differently: Take 20 mg by mouth at bedtime.) 90 tablet 2  . Calcium-Magnesium-Zinc (CAL-MAG-ZINC PO) Take 1 tablet by mouth daily.    . Cholecalciferol (VITAMIN D3) 5000 units TABS Take 5,000 Units by mouth daily.    . Coenzyme Q10 (COQ10) 100 MG CAPS Take 100 mg by mouth daily.    . fesoterodine (TOVIAZ) 8 MG TB24 tablet Take 8 mg by mouth every evening.    . finasteride (PROSCAR) 5 MG tablet Take 1 tablet (5 mg total) by mouth daily. 30 tablet 0  . furosemide (LASIX) 80 MG tablet Take 1 tablet (80 mg total) by mouth daily. 30 tablet 0  . isosorbide mononitrate (IMDUR) 60 MG 24 hr tablet TAKE (2) TABLETS BY MOUTH DAILY. (Patient taking differently: Take 120 mg by mouth daily.) 180  tablet 3  . LORazepam (ATIVAN) 1 MG tablet Take 1 mg by mouth at bedtime.    . metoprolol succinate (TOPROL-XL) 25 MG 24 hr tablet Take 1 tablet (25 mg total) by mouth daily. 90 tablet 3  . Multiple Vitamins-Minerals (PRESERVISION AREDS) CAPS Take 1 tablet by mouth daily.    . nitroGLYCERIN (NITROSTAT) 0.4 MG SL tablet Place 0.4 mg under the tongue every 5 (five) minutes as needed for chest pain.    Vladimir Faster Glycol-Propyl Glycol (SYSTANE OP) Place 2 drops into both eyes 2 (two) times daily as needed.    . polyethylene glycol powder (GLYCOLAX/MIRALAX) 17 GM/SCOOP powder Take 17 g by mouth daily as needed for mild constipation.    . ranibizumab (LUCENTIS) 0.5 MG/0.05ML SOLN 0.5 mg by Intravitreal route See admin instructions. Every 9 weeks    . sacubitril-valsartan (ENTRESTO) 24-26 MG Take 1 tablet by mouth 2 (two) times daily. 60 tablet 0  . tamsulosin (FLOMAX) 0.4 MG CAPS capsule Take 1 capsule (0.4 mg total) by mouth daily. 30 capsule 3  . warfarin (COUMADIN) 5 MG tablet Take 0.5 tablets (2.5 mg total) by mouth daily.    Marland Kitchen aspirin EC 81 MG tablet Take 81 mg by mouth daily. (Patient not taking: Reported on 06/28/2020)      No current facility-administered medications for this encounter.    Vitals:   06/28/20 1427  BP: (!) 130/58  Pulse: 63  SpO2: 96%  Weight: 76.6 kg (168 lb 12.8 oz)   Wt Readings from Last 3 Encounters:  06/28/20 76.6 kg (168 lb 12.8 oz)  06/21/20 72.2 kg (159 lb 2.8 oz)  04/12/20 86.1 kg (189 lb 12.8 oz)    ReDS Vest / Clip - 06/28/20 1400      ReDS Vest / Clip   Station Marker C    Ruler Value 30    ReDS Value Range Low volume    ReDS Actual Value 27           PHYSICAL EXAM: General:  Walked slowly in the clinic with a rolling walker. No respiratory difficulty HEENT: normal Neck: supple. no JVD. Carotids 2+ bilat; no bruits. No lymphadenopathy or thyromegaly appreciated. Cor: PMI nondisplaced. Irregular rate & rhythm. No rubs, gallops or murmurs. Lungs: clear Abdomen: soft, nontender, nondistended. No hepatosplenomegaly. No bruits or masses. Good bowel sounds. Extremities: no cyanosis, clubbing, rash, edema Neuro: alert & oriented x 3, cranial nerves grossly intact. moves all 4 extremities w/o difficulty. Affect pleasant.   EKG: A flutter 58 bpm.    ASSESSMENT & PLAN: 1. Combined Systolic and Diastolic Heart Failure - Echo 2019 EF 55-60% . Repeat echo 05/2020 showed EF 45-50%, RWMA in basal-mid, inferior, and inferolateral wall unchanged from previous, mild concentric LVH, G2DD, mildly reduced RV systolic function, severe LAE, moderate RAE, mild-moderate MR, and stable aortic valve s/p TAVR -NYHA III. Reds Clip 28%  - Volume status low. Will need to cut back lasix to 40 mg daily - Increase spironolactone to 25 mg daily. Check BMET today and in 7 days.  - Continue current dose metoprolol and Entresto   - No SGLT2i with urinary retention.   2. Aortic Valve Disease - h/o severe AS, s/p TAVR in 2019 - stable on echo 06/2020   3. Chronic Afib - rate controlled w/  blocker - on coumadin for a/c .Has had some hematuria but improving.   4. CAD  - history  outlined in HPI above - No chest pain.  - followed by  Dr. Burt Knack - asa,  blocker + statin      Refer to Social Work:  N/A     Refer to Pharmacy: We saw jointly today.  Refer to Financial Counselor : N/A Refer to Home Health:  Active  Refer to Advanced Heart Clinic: No he is established with Dr Burt Knack and has follow up next month.  Refer to General Cardiology: N/A as above   Discussed medications for HF Pharmacy [place] during the visit.  I personally called Dr Reynaldo Minium to discuss today visit.  Follow up as needed.  Dalal Livengood NP-C  5:21 PM

## 2020-06-28 ENCOUNTER — Ambulatory Visit (INDEPENDENT_AMBULATORY_CARE_PROVIDER_SITE_OTHER): Payer: Medicare Other | Admitting: Pharmacist

## 2020-06-28 ENCOUNTER — Encounter (HOSPITAL_COMMUNITY): Payer: Self-pay

## 2020-06-28 ENCOUNTER — Ambulatory Visit (HOSPITAL_COMMUNITY)
Admit: 2020-06-28 | Discharge: 2020-06-28 | Disposition: A | Discharge status: Home / Self Care | Payer: Medicare Other | Attending: Cardiology | Admitting: Cardiology

## 2020-06-28 ENCOUNTER — Telehealth (HOSPITAL_COMMUNITY): Payer: Self-pay

## 2020-06-28 ENCOUNTER — Other Ambulatory Visit: Payer: Self-pay

## 2020-06-28 VITALS — BP 130/58 | HR 63 | Wt 168.8 lb

## 2020-06-28 DIAGNOSIS — I25119 Atherosclerotic heart disease of native coronary artery with unspecified angina pectoris: Secondary | ICD-10-CM

## 2020-06-28 DIAGNOSIS — I482 Chronic atrial fibrillation, unspecified: Secondary | ICD-10-CM

## 2020-06-28 DIAGNOSIS — Z79899 Other long term (current) drug therapy: Secondary | ICD-10-CM | POA: Insufficient documentation

## 2020-06-28 DIAGNOSIS — I4821 Permanent atrial fibrillation: Secondary | ICD-10-CM

## 2020-06-28 DIAGNOSIS — I872 Venous insufficiency (chronic) (peripheral): Secondary | ICD-10-CM | POA: Diagnosis not present

## 2020-06-28 DIAGNOSIS — R0602 Shortness of breath: Secondary | ICD-10-CM | POA: Diagnosis not present

## 2020-06-28 DIAGNOSIS — Z5181 Encounter for therapeutic drug level monitoring: Secondary | ICD-10-CM

## 2020-06-28 DIAGNOSIS — Z7901 Long term (current) use of anticoagulants: Secondary | ICD-10-CM | POA: Diagnosis not present

## 2020-06-28 DIAGNOSIS — Z7982 Long term (current) use of aspirin: Secondary | ICD-10-CM | POA: Diagnosis not present

## 2020-06-28 DIAGNOSIS — I5042 Chronic combined systolic (congestive) and diastolic (congestive) heart failure: Secondary | ICD-10-CM | POA: Insufficient documentation

## 2020-06-28 DIAGNOSIS — I350 Nonrheumatic aortic (valve) stenosis: Secondary | ICD-10-CM | POA: Diagnosis not present

## 2020-06-28 DIAGNOSIS — S51811D Laceration without foreign body of right forearm, subsequent encounter: Secondary | ICD-10-CM | POA: Diagnosis not present

## 2020-06-28 DIAGNOSIS — I110 Hypertensive heart disease with heart failure: Secondary | ICD-10-CM | POA: Insufficient documentation

## 2020-06-28 DIAGNOSIS — R319 Hematuria, unspecified: Secondary | ICD-10-CM | POA: Diagnosis not present

## 2020-06-28 DIAGNOSIS — Z20828 Contact with and (suspected) exposure to other viral communicable diseases: Secondary | ICD-10-CM | POA: Diagnosis not present

## 2020-06-28 DIAGNOSIS — I739 Peripheral vascular disease, unspecified: Secondary | ICD-10-CM | POA: Diagnosis not present

## 2020-06-28 DIAGNOSIS — I10 Essential (primary) hypertension: Secondary | ICD-10-CM | POA: Diagnosis not present

## 2020-06-28 DIAGNOSIS — I2510 Atherosclerotic heart disease of native coronary artery without angina pectoris: Secondary | ICD-10-CM | POA: Diagnosis not present

## 2020-06-28 DIAGNOSIS — E785 Hyperlipidemia, unspecified: Secondary | ICD-10-CM | POA: Insufficient documentation

## 2020-06-28 DIAGNOSIS — Z1159 Encounter for screening for other viral diseases: Secondary | ICD-10-CM | POA: Diagnosis not present

## 2020-06-28 DIAGNOSIS — Z8249 Family history of ischemic heart disease and other diseases of the circulatory system: Secondary | ICD-10-CM | POA: Diagnosis not present

## 2020-06-28 DIAGNOSIS — Z952 Presence of prosthetic heart valve: Secondary | ICD-10-CM | POA: Insufficient documentation

## 2020-06-28 DIAGNOSIS — S31010D Laceration without foreign body of lower back and pelvis without penetration into retroperitoneum, subsequent encounter: Secondary | ICD-10-CM | POA: Diagnosis not present

## 2020-06-28 DIAGNOSIS — I4891 Unspecified atrial fibrillation: Secondary | ICD-10-CM | POA: Diagnosis not present

## 2020-06-28 LAB — BASIC METABOLIC PANEL
Anion gap: 4 — ABNORMAL LOW (ref 5–15)
BUN: 26 mg/dL — ABNORMAL HIGH (ref 8–23)
CO2: 29 mmol/L (ref 22–32)
Calcium: 8.3 mg/dL — ABNORMAL LOW (ref 8.9–10.3)
Chloride: 103 mmol/L (ref 98–111)
Creatinine, Ser: 0.96 mg/dL (ref 0.61–1.24)
GFR, Estimated: >60 mL/min (ref >60)
Glucose, Bld: 97 mg/dL (ref 70–99)
Potassium: 4.3 mmol/L (ref 3.5–5.1)
Sodium: 136 mmol/L (ref 135–145)

## 2020-06-28 LAB — POCT INR: INR: 1.2 — AB (ref 2.0–3.0)

## 2020-06-28 MED ORDER — SPIRONOLACTONE 25 MG PO TABS: 25.0000 mg | ORAL_TABLET | Freq: Every day | ORAL | 3 refills | Status: DC

## 2020-06-28 MED ORDER — FUROSEMIDE 40 MG PO TABS: 40.0000 mg | ORAL_TABLET | Freq: Every day | ORAL | 6 refills | Status: DC

## 2020-06-28 NOTE — Patient Instructions (Signed)
Description   Spoke with Magda Paganini RN with [facility] [place] and instructed patient to take 2 tablets today and tomorrow then resume 1 tablet daily.  Recheck in 1 week.  Stay consistent with leafy veggies. Recheck INR on Monday. Call if placed on any new medications or if scheduled for any procedures. Coumadin Clinic [phone].

## 2020-06-28 NOTE — Progress Notes (Signed)
ReDS Vest / Clip - 06/28/20 1400      ReDS Vest / Clip   Station Marker C    Ruler Value 30    ReDS Value Range Low volume    ReDS Actual Value 27

## 2020-06-28 NOTE — Patient Instructions (Signed)
START Spironolactone 2 5mg  ,one tab daily DECREASE Lasix 40 mg, one dialy  Labs today We will only contact you if something comes back abnormal or we need to make some changes. Otherwise no news is good news!  Labs needed in 7-10 day  Please feel free to contact us for an appointment.  At the [facility], you and your health needs are our priority. As part of our continuing mission to provide you with exceptional heart care, we have created designated Provider Care Teams. These Care Teams include your primary Cardiologist (physician) and Advanced Practice Providers (APPs- Physician Assistants and Nurse Practitioners) who all work together to provide you with the care you need, when you need it.   You may see any of the following providers on your designated Care Team at your next follow up: Marland Kitchen Dr Glori Bickers . Dr Loralie Champagne . Dr Vickki Muff . Darrick Grinder, NP . Lyda Jester, [place] . Audry Riles, PharmD   Please be sure to bring in all your medications bottles to every appointment.

## 2020-06-28 NOTE — Telephone Encounter (Signed)
Heart Failure Nurse Navigator Progress Note  Spoke with pt daughter, [name], regarding HV TOC appt today at [place] stated her sister is planning to bring him today. Pt currently at [facility] [employer] Has private transportation.   Hx: noncompliance with diuretics. [place] hospital with foley, urology following.   Pricilla Holm, RN, BSN Heart Failure Nurse Navigator [phone]

## 2020-06-28 NOTE — Progress Notes (Signed)
Heart and Vascular Center Transitions of Care Clinic Heart Failure Pharmacist Encounter  PCP: Burnard Bunting, MD PCP-Cardiologist: Sherren Mocha, MD  HPI:  87 yo male with a PMH of CAD s/p POBA to RCA, permanent atrial fibrillation on warfarin, chronic diastolic CHF, aortic stenosis s/p TAVR in 2018, AAA s/p repair, chronic venous insufficiency, HTN, and HLD. Presented with worsening semiproductive cough and found to have Influenza A viral PNA. ECHO on 06/12/20 revealed LVEF 45-50%, RV function mildly reduced, mild-moderate MR and stable TAVR. Was markedly volume overloaded with urinary retention now s/p foley catheter placement with initial gross hematuria due to traumatic placement on anticoagulation. He was then discharged on 06/21/20.  Today, Tyler Williams presents to the [facility] with his daughter for follow up. He denies having shortness of breath, orthopnea, lightheadedness or dizziness. His lungs are clear with only mild LE edema. He has been receiving his medications from the nurses at [facility]. He states that he feels like the lasix dose is too high for him. He is still having discomfort with the foley.  HF Medications: Furosemide 80 mg daily Metoprolol XL 25 mg daily Entresto 24/26 mg BID Imdur 120 mg daily  Has the patient been experiencing any side effects to the medications prescribed?  no  Does the patient have any problems obtaining medications due to transportation or finances?   no  Understanding of regimen: good Understanding of indications: good Potential of compliance: excellent Patient understands to avoid NSAIDs. Patient understands to avoid decongestants.   Pertinent Lab Values: . Serum creatinine 0.96, BUN 26, Potassium 4.3, Sodium 136  Vital Signs: . Weight: 168 lbs (discharge weight: 159 lbs) . Blood pressure: 130/58 mmHg  . Heart rate: 63 bpm   Medication Assistance / Insurance Benefits Check: Does the patient have  prescription insurance?  Yes Type of insurance plan: Rx[employer]vers[employer]  Outpatient Pharmacy:  Current outpatient pharmacy: [place] Was the [facility] pharmacy used to supply discharge medications? yes If TOC pharmacy was used, were the refills transferred out to current pharmacy yet? No - will message PharmD to initiate transfers  Is the patient willing to transition their outpatient pharmacy to utilize a [facility] outpatient pharmacy with or without mail order?   No  Assessment: 1) Chronic systolic CHF (EF 08-65%), due to ICM. NYHA class II symptoms. - Reduce furosemide to 40 mg daily - Continue metoprolol XL 25 mg daily - Continue Entresto 24/26 mg BID - Increase spironolactone to 25 mg daily. BMET done today. Recommend checking again in 1 week. - No SGLT2i with urinary retention and foley complications - Continue Imdur 120 mg daily  Plan: 1) Medication changes: - Reduce furosemide to 40 mg daily - Increase spironolactone to 25 mg daily  2) Follow up: - Next appointment with Dr. Burt Knack on June 8th   Kerby Nora, PharmD, BCPS Heart Failure Transitions of Care Clinic Pharmacist [phone]

## 2020-06-29 DIAGNOSIS — S51811D Laceration without foreign body of right forearm, subsequent encounter: Secondary | ICD-10-CM | POA: Diagnosis not present

## 2020-06-29 DIAGNOSIS — I739 Peripheral vascular disease, unspecified: Secondary | ICD-10-CM | POA: Diagnosis not present

## 2020-06-29 DIAGNOSIS — S31010D Laceration without foreign body of lower back and pelvis without penetration into retroperitoneum, subsequent encounter: Secondary | ICD-10-CM | POA: Diagnosis not present

## 2020-06-29 DIAGNOSIS — I10 Essential (primary) hypertension: Secondary | ICD-10-CM | POA: Diagnosis not present

## 2020-06-29 DIAGNOSIS — Z7982 Long term (current) use of aspirin: Secondary | ICD-10-CM | POA: Diagnosis not present

## 2020-06-29 DIAGNOSIS — I4891 Unspecified atrial fibrillation: Secondary | ICD-10-CM | POA: Diagnosis not present

## 2020-07-01 DIAGNOSIS — I10 Essential (primary) hypertension: Secondary | ICD-10-CM | POA: Diagnosis not present

## 2020-07-01 DIAGNOSIS — I739 Peripheral vascular disease, unspecified: Secondary | ICD-10-CM | POA: Diagnosis not present

## 2020-07-01 DIAGNOSIS — S51811D Laceration without foreign body of right forearm, subsequent encounter: Secondary | ICD-10-CM | POA: Diagnosis not present

## 2020-07-01 DIAGNOSIS — Z7982 Long term (current) use of aspirin: Secondary | ICD-10-CM | POA: Diagnosis not present

## 2020-07-01 DIAGNOSIS — S31010D Laceration without foreign body of lower back and pelvis without penetration into retroperitoneum, subsequent encounter: Secondary | ICD-10-CM | POA: Diagnosis not present

## 2020-07-01 DIAGNOSIS — I4891 Unspecified atrial fibrillation: Secondary | ICD-10-CM | POA: Diagnosis not present

## 2020-07-05 ENCOUNTER — Ambulatory Visit (INDEPENDENT_AMBULATORY_CARE_PROVIDER_SITE_OTHER): Payer: Medicare Other | Admitting: *Deleted

## 2020-07-05 DIAGNOSIS — I4821 Permanent atrial fibrillation: Secondary | ICD-10-CM

## 2020-07-05 DIAGNOSIS — I10 Essential (primary) hypertension: Secondary | ICD-10-CM | POA: Diagnosis not present

## 2020-07-05 DIAGNOSIS — Z5181 Encounter for therapeutic drug level monitoring: Secondary | ICD-10-CM

## 2020-07-05 DIAGNOSIS — I4891 Unspecified atrial fibrillation: Secondary | ICD-10-CM | POA: Diagnosis not present

## 2020-07-05 DIAGNOSIS — Z7982 Long term (current) use of aspirin: Secondary | ICD-10-CM | POA: Diagnosis not present

## 2020-07-05 DIAGNOSIS — I739 Peripheral vascular disease, unspecified: Secondary | ICD-10-CM | POA: Diagnosis not present

## 2020-07-05 DIAGNOSIS — S31010D Laceration without foreign body of lower back and pelvis without penetration into retroperitoneum, subsequent encounter: Secondary | ICD-10-CM | POA: Diagnosis not present

## 2020-07-05 DIAGNOSIS — S51811D Laceration without foreign body of right forearm, subsequent encounter: Secondary | ICD-10-CM | POA: Diagnosis not present

## 2020-07-05 LAB — POCT INR: INR: 2.3 (ref 2.0–3.0)

## 2020-07-05 NOTE — Patient Instructions (Signed)
Description   Spoke with Magda Paganini RN with [facility] and instructed patient to continue taking 1 tablet daily.  Recheck in 1 week. Stay consistent with leafy veggies. Recheck INR in 1 week by Home Health. Call if placed on any new medications or if scheduled for any procedures. Coumadin Clinic [phone].

## 2020-07-06 ENCOUNTER — Emergency Department (HOSPITAL_COMMUNITY)
Admission: EM | Admit: 2020-07-06 | Discharge: 2020-07-06 | Disposition: A | Discharge status: Skilled Nursing Facility | Payer: Medicare Other | Attending: Emergency Medicine | Admitting: Emergency Medicine

## 2020-07-06 DIAGNOSIS — M21332 Wrist drop, left wrist: Secondary | ICD-10-CM | POA: Insufficient documentation

## 2020-07-06 DIAGNOSIS — Z96652 Presence of left artificial knee joint: Secondary | ICD-10-CM | POA: Insufficient documentation

## 2020-07-06 DIAGNOSIS — Z85828 Personal history of other malignant neoplasm of skin: Secondary | ICD-10-CM | POA: Insufficient documentation

## 2020-07-06 DIAGNOSIS — I10 Essential (primary) hypertension: Secondary | ICD-10-CM | POA: Diagnosis not present

## 2020-07-06 DIAGNOSIS — Z7901 Long term (current) use of anticoagulants: Secondary | ICD-10-CM | POA: Diagnosis not present

## 2020-07-06 DIAGNOSIS — I2510 Atherosclerotic heart disease of native coronary artery without angina pectoris: Secondary | ICD-10-CM | POA: Insufficient documentation

## 2020-07-06 DIAGNOSIS — Z7982 Long term (current) use of aspirin: Secondary | ICD-10-CM | POA: Diagnosis not present

## 2020-07-06 DIAGNOSIS — R531 Weakness: Secondary | ICD-10-CM | POA: Diagnosis not present

## 2020-07-06 DIAGNOSIS — I4821 Permanent atrial fibrillation: Secondary | ICD-10-CM | POA: Diagnosis not present

## 2020-07-06 DIAGNOSIS — Z79899 Other long term (current) drug therapy: Secondary | ICD-10-CM | POA: Diagnosis not present

## 2020-07-06 DIAGNOSIS — I110 Hypertensive heart disease with heart failure: Secondary | ICD-10-CM | POA: Insufficient documentation

## 2020-07-06 DIAGNOSIS — R29810 Facial weakness: Secondary | ICD-10-CM | POA: Diagnosis not present

## 2020-07-06 DIAGNOSIS — I5043 Acute on chronic combined systolic (congestive) and diastolic (congestive) heart failure: Secondary | ICD-10-CM | POA: Diagnosis not present

## 2020-07-06 NOTE — Discharge Instructions (Signed)
It was wonderful to see you today.  We believe that you compressed your radial nerve which has caused your wrist to drop.  You are placed in a splint to help keep it in place, hopefully should continue to improve on its own.  You may need physical therapy in the future if it is not getting better shortly.  If you have any further weakness, numbness, drooping of your face, or lightheadedness/dizziness/vision changes please return to the ED.  Otherwise please follow-up with your PCP later this week.

## 2020-07-06 NOTE — ED Triage Notes (Signed)
BIB [facility] after staff at [facility] called to report pt having increased weakness from L wrist down, no other neuro deficits reported. Pt LKW 2300. Hx of TIA, afib.

## 2020-07-06 NOTE — ED Provider Notes (Signed)
[facility] EMERGENCY DEPARTMENT Provider Note   CSN: [id number] Arrival date & time: 07/06/20  1124     History Chief Complaint  Patient presents with  . Weakness    Left hand     Tyler Williams is a 87 y.o. male, with a history of chronic atrial fibrillation on Coumadin, dilated cardiomyopathy, s/p TAVR, hypertension, CAD, and aortic aneurysm, presenting for evaluation of left hand weakness.  He reports he was in his usual state of health yesterday.  Went to sleep around 11 PM in a cushioned recliner and to keep his hands warm through the night he placed his left hand/arm in between the cushion and the armrest for the entire night.  This morning when he woke up around about 10 AM noted that his left hand felt weak.  He could not move his left wrist properly.  No associated numbness or tingling, could still feel his hand.  He tried to shake it off however was not any better and so he opted to seek care.  He denies any weakness elsewhere, facial droop, gait difficulty, difficulty with speech or understanding, lightheadedness/dizziness, or blurred vision. Uses walker for ambulation.   Follows with his cardiologist for INR checks.  Most recent INR yesterday 2.3.     Past Medical History:  Diagnosis Date  . A-fib ([place])   . AAA (abdominal aortic aneurysm) ([place])    s/p repair  . Aortic stenosis, mild    a. mean gradient 17 mmHg on 04/2012 TTE  . Arthritis    knees  . CAD (coronary artery disease)    a. s/p multiple percutaneous prior coronary artery interventions b. 05/2012: unsuccessful PCI to tight RCA->medically managed  . Cancer [facility])    skin cancer removed, right hand, left leg, chest  . Carotid artery disease ([place])    0-39% bilateral ICA stenoses 11/2011  . Chronic anticoagulation    Coumadin  . Coronary artery disease   . GERD (gastroesophageal reflux disease)   . GERD (gastroesophageal reflux disease)   . History of pulmonary embolism    1964  related to dislocated hip on right   . History of skin cancer   . Hypercholesteremia   . Hypertension   . Incidental cecal mass noted on CT imaging 05/24/2016   **An incidental finding of potential clinical significance has been found. 4.6 x 5.6 x 3.7 cm masslike lesion in the cecum adjacent to the ileocecal valve may represent a large polyp or colonic neoplasm. Correlation with nonemergent colonoscopy is strongly recommended in the near future to better evaluate this finding.**  . Macular degeneration    eye injections  . Microscopic hematuria    Followed by urology  . Permanent atrial fibrillation ([place])   . PVD (peripheral vascular disease) ([place])    Bilateral renal artery atherosclerosis, SMA stenosis with collateralization  . RBBB    Chronic  . 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 SSS (sick sinus syndrome) [facility])     Patient Active Problem List   Diagnosis Date Noted  . Influenza A   . Gross hematuria   . DCM (dilated cardiomyopathy) ([place])   . Chronic atrial fibrillation ([place])   . Congestive heart failure ([place]) 06/12/2020  . Aorta aneurysm ([place]) 02/14/2018  . Acute on chronic combined systolic and diastolic CHF (congestive heart failure) ([place]) 09/25/2017  . Precordial chest pain 08/28/2017  .  HLD (hyperlipidemia) 08/28/2017  . Anticoagulated on Coumadin 08/28/2017  . HTN (hypertension) 08/28/2017  . S/P TAVR (transcatheter aortic valve replacement)   . 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 Incidental cecal mass noted on CT imaging 05/24/2016  . Exudative age-related macular 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 ([place]) 12/22/2014  . Encounter for therapeutic drug monitoring 03/20/2013  . Pseudophakia of both eyes 03/19/2013  . Macular degeneration 03/19/2013  . Cholecystitis, acute 12/06/2012  .  Chest pain, atypical 12/03/2012  . Hematuria, microscopic 12/03/2012  . Cystoid macular edema 05/14/2012  . Overweight (BMI 25.0-29.9) 01/23/2012  . Hyponatremia 01/23/2012  . S/P right UKR 01/22/2012  . Carotid artery bruit 11/21/2011  . Carotid bruit 11/21/2011  . Presence of intraocular lens 02/16/2011  . EDEMA 07/14/2009  . Essential hypertension 01/27/2009  . HYPERCHOLESTEROLEMIA  IIA 10/21/2008  . Coronary artery disease involving native coronary artery with angina pectoris ([place]) 10/21/2008  . Permanent atrial fibrillation ([place]) 10/21/2008    Past Surgical History:  Procedure Laterality Date  . ABDOMINAL AORTIC ANEURYSM REPAIR     1993   . CARDIAC CATHETERIZATION  05/24/2012   pD1 20%, oCFX 20%, mCFX 30%, ostial Int Br 30%, distal AV groove CFX 30%, pRCA 30-40%, mRCA 99%, dRCA 30%, PL branch small with diffuse 40%. Unable to pass wire past RCA lesion->medically managed  . CHOLECYSTECTOMY N/A 12/05/2012   Procedure: LAPAROSCOPIC CHOLECYSTECTOMY ;  Surgeon: Edward Jolly, MD;  Location: [facility];  Service: General;  Laterality: N/A;  . CHOLECYSTECTOMY    . CORONARY ANGIOPLASTY  1980, 1990  . CORONARY BALLOON ANGIOPLASTY N/A 08/31/2017   Procedure: CORONARY BALLOON ANGIOPLASTY;  Surgeon: Martinique, Peter M, MD;  Location: [facility] CV LAB;  Service: Cardiovascular;  Laterality: N/A;  . EYE SURGERY     hx of cataract surgery   . [place]  . HERNIA REPAIR     right inguinal hernia repair   . JOINT REPLACEMENT     left knee replacement, partial right  . LEFT HEART CATH AND CORONARY ANGIOGRAPHY N/A 08/31/2017   Procedure: LEFT HEART CATH AND CORONARY ANGIOGRAPHY;  Surgeon: Martinique, Peter M, MD;  Location: [facility];  Service: Cardiovascular;  Laterality: N/A;  . LEFT HEART CATHETERIZATION WITH CORONARY ANGIOGRAM N/A 05/24/2012   Procedure: LEFT HEART CATHETERIZATION WITH CORONARY ANGIOGRAM;  Surgeon: Burnell Blanks, MD;  Location: [facility] CATH LAB;   Service: Cardiovascular;  Laterality: N/A;  . ORTHOPEDIC SURGERY    . OTHER SURGICAL HISTORY     right knee popliteal aneurysm surgery stent placed   . OTHER SURGICAL HISTORY    . PARTIAL KNEE ARTHROPLASTY  01/22/2012   Procedure: UNICOMPARTMENTAL KNEE;  Surgeon: Mauri Pole, MD;  Location: [facility];  Service: Orthopedics;  Laterality: Right;  . PERCUTANEOUS CORONARY INTERVENTION-BALLOON ONLY  05/24/2012   Procedure: PERCUTANEOUS CORONARY INTERVENTION-BALLOON ONLY;  Surgeon: Burnell Blanks, MD;  Location: [facility] C[facility] LAB;  Service: Cardiovascular;;  . RIGHT/LEFT HEART CATH AND CORONARY ANGIOGRAPHY N/A 05/12/2016   Procedure: Right/Left Heart Cath and Coronary Angiography;  Surgeon: Sherren Mocha, MD;  Location: [facility] CV LAB;  Service: Cardiovascular;  Laterality: N/A;  . TEE WITHOUT CARDIOVERSION N/A 06/13/2016   Procedure: TRANSESOPHAGEAL ECHOCARDIOGRAM (TEE);  Surgeon: Sherren Mocha, MD;  Location: [facility];  Service: Open Heart Surgery;  Laterality: N/A;  . TONSILLECTOMY    . TOTAL KNEE ARTHROPLASTY Left   . TRANSCATHETER AORTIC VALVE REPLACEMENT, TRANSFEMORAL N/A  06/13/2016   Procedure: TRANSCATHETER AORTIC VALVE REPLACEMENT, TRANSFEMORAL;  Surgeon: Sherren Mocha, MD;  Location: [facility];  Service: Open Heart Surgery;  Laterality: N/A;       Family History  Problem Relation Age of Onset  . Heart attack Mother 31    Social History   Tobacco Use  . Smoking status: Never Smoker  . Smokeless tobacco: Never Used  Vaping Use  . Vaping Use: Never used  Substance Use Topics  . Alcohol use: Not Currently    Comment: patient doesn't drink anymore  . Drug use: Never    Home Medications Prior to Admission medications   Medication Sig Start Date End Date Taking? Authorizing Provider  aspirin EC 81 MG tablet Take 81 mg by mouth daily. Patient not taking: Reported on 06/28/2020    [provider]  atorvastatin (LIPITOR) 20 MG tablet TAKE 1 TABLET ONCE DAILY. Patient  taking differently: Take 20 mg by mouth at bedtime. 04/09/20   Sherren Mocha, MD  Calcium-Magnesium-Zinc (CAL-MAG-ZINC PO) Take 1 tablet by mouth daily.    [provider]  Cholecalciferol (VITAMIN D3) 5000 units TABS Take 5,000 Units by mouth daily.    [provider]  Coenzyme Q10 (COQ10) 100 MG CAPS Take 100 mg by mouth daily.    [provider]  fesoterodine (TOVIAZ) 8 MG TB24 tablet Take 8 mg by mouth every evening.    [provider]  finasteride (PROSCAR) 5 MG tablet Take 1 tablet (5 mg total) by mouth daily. 06/21/20   Charlynne Cousins, MD  furosemide (LASIX) 40 MG tablet Take 1 tablet (40 mg total) by mouth daily. 06/28/20   Clegg, Amy D, NP  isosorbide mononitrate (IMDUR) 60 MG 24 hr tablet TAKE (2) TABLETS BY MOUTH DAILY. Patient taking differently: Take 120 mg by mouth daily. 04/06/20   Sherren Mocha, MD  LORazepam (ATIVAN) 1 MG tablet Take 1 mg by mouth at bedtime.    [provider]  metoprolol succinate (TOPROL-XL) 25 MG 24 hr tablet Take 1 tablet (25 mg total) by mouth daily. 10/10/19   [name] T, PA-C  Multiple Vitamins-Minerals (PRESERVISION AREDS) CAPS Take 1 tablet by mouth daily. 03/04/09   [provider]  nitroGLYCERIN (NITROSTAT) 0.4 MG SL tablet Place 0.4 mg under the tongue every 5 (five) minutes as needed for chest pain.    [provider]  Polyethyl Glycol-Propyl Glycol (SYSTANE OP) Place 2 drops into both eyes 2 (two) times daily as needed.    [provider]  polyethylene glycol powder (GLYCOLAX/MIRALAX) 17 GM/SCOOP powder Take 17 g by mouth daily as needed for mild constipation.    [provider]  ranibizumab (LUCENTIS) 0.5 MG/0.05ML SOLN 0.5 mg by Intravitreal route See admin instructions. Every 9 weeks    [provider]  sacubitril-valsartan (ENTRESTO) 24-26 MG Take 1 tablet by mouth 2 (two) times daily. 06/21/20   Charlynne Cousins, MD  spironolactone (ALDACTONE) 25  MG tablet Take 1 tablet (25 mg total) by mouth daily. 06/28/20 09/26/20  Clegg, Amy D, NP  tamsulosin (FLOMAX) 0.4 MG CAPS capsule Take 1 capsule (0.4 mg total) by mouth daily. 06/21/20   Charlynne Cousins, MD  warfarin (COUMADIN) 5 MG tablet Take 0.5 tablets (2.5 mg total) by mouth daily. 06/21/20   Charlynne Cousins, MD    Allergies    No known allergies  Review of Systems   Review of Systems  Constitutional: Negative for chills, fatigue and fever.  HENT: Negative  for congestion.   Respiratory: Negative for chest tightness and shortness of breath.   Cardiovascular: Negative for chest pain.  Gastrointestinal: Negative for nausea and vomiting.  Genitourinary: Negative for flank pain.  Musculoskeletal: Negative for gait problem.  Skin: Negative for wound.  Neurological: Positive for weakness. Negative for dizziness, syncope, facial asymmetry, speech difficulty, light-headedness, numbness and headaches.    Physical Exam Updated Vital Signs BP (!) 157/72 (BP Location: Right Arm)   Pulse 75   Temp 97.7 F (36.5 C) (Oral)   Resp 16   SpO2 99%   Physical Exam Constitutional:      Appearance: Normal appearance.     Comments: Well-appearing, appears younger than stated age  HENT:     Head: Normocephalic and atraumatic.     Nose: Nose normal.     Mouth/Throat:     Mouth: Mucous membranes are moist.  Eyes:     Extraocular Movements: Extraocular movements intact.     Conjunctiva/sclera: Conjunctivae normal.     Pupils: Pupils are equal, round, and reactive to light.  Cardiovascular:     Rate and Rhythm: Rhythm irregular.     Pulses: Normal pulses.  Pulmonary:     Effort: Pulmonary effort is normal.     Breath sounds: Normal breath sounds.  Abdominal:     Palpations: Abdomen is soft.  Musculoskeletal:     Cervical back: Neck supple.     Comments: 1+ pitting edema to midshin bilaterally, lower legs equal in size.  Skin:    General: Skin is warm and dry.     Capillary  Refill: Capillary refill takes less than 2 seconds.  Neurological:     Mental Status: He is alert.     Deep Tendon Reflexes: Reflexes normal.     Comments: Alert and oriented X3.  CN II-XII intact.  EOMI, PERRLA.  No pronator drift bilaterally.  5/5 right upper extremity and bilateral lower extremity strength.  5/5 strength through rotator cuff, triceps/biceps on the left.  Left wrist drop present, unable to extend wrist or fingers.  5/5 hand grip strength on the left.  Can spontaneously move his fingers.  Sensation to light touch intact throughout, including left hand/wrist.  Psychiatric:        Mood and Affect: Mood normal.        Behavior: Behavior normal.     ED Results / Procedures / Treatments   Labs (all labs ordered are listed, but only abnormal results are displayed) Labs Reviewed - No data to display  EKG None  Radiology No results found.  Procedures Procedures   Medications Ordered in ED Medications - No data to display  ED Course  I have reviewed the triage vital signs and the nursing notes.  Pertinent labs & imaging results that were available during my care of the patient were reviewed by me and considered in my medical decision making (see chart for details).  Clinical Course as of 07/06/20 1605  Tue Jul 06, 2020  1304 Ambulated patient with walker with the assistance of his son at bedside.  Stable steady gait.  He feels that his walking is at baseline. [SB]    Clinical Course User Index [SB] Patriciaann Clan, DO   MDM Rules/Calculators/A&P                          87 year old gentleman, living independently, with a history of persistent atrial fibrillation on warfarin (at therapeutic level 2.3 on 5/22),  hypertension, CHF, and HLD, presenting for evaluation of left wrist weakness.  He woke up with inability to extend his left hand weakness, after sleeping all night in a recliner chair with his arm tucked in between the cushion and armrest.  He is  hemodynamically stable and well-appearing on exam, neurologically intact with exception of left wrist drop including inability to extend his left wrist and fingers.  Sensation intact without pronator drift.  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  Certainly considered CVA given age and risk factors, however given localized weakness to wrist only, doubt central etiology with otherwise preserved strength elsewhere.  Placed in wrist splint and recommended follow-up with PCP this week.  Hopefully should spontaneously improve, may need PT in the future.  ED precautions discussed.  Final Clinical Impression(s) / ED Diagnoses Final diagnoses:  Acquired wrist drop, left    Rx / DC Orders ED Discharge Orders    None       Patriciaann Clan, DO 07/06/20 1613    Blanchie Dessert, MD 07/07/20 [phone]

## 2020-07-06 NOTE — Progress Notes (Signed)
Orthopedic Tech Progress Note Patient Details:  Tyler Williams 05-Aug-1926 [id number]  Ortho Devices Type of Ortho Device: Wrist splint Ortho Device/Splint Location: LUE Ortho Device/Splint Interventions: Ordered,Application,Adjustment   Post Interventions Patient Tolerated: Well Instructions Provided: Care of Walker 07/06/2020, 12:40 PM

## 2020-07-07 DIAGNOSIS — R338 Other retention of urine: Secondary | ICD-10-CM | POA: Diagnosis not present

## 2020-07-07 DIAGNOSIS — R310 Gross hematuria: Secondary | ICD-10-CM | POA: Diagnosis not present

## 2020-07-08 DIAGNOSIS — S51811D Laceration without foreign body of right forearm, subsequent encounter: Secondary | ICD-10-CM | POA: Diagnosis not present

## 2020-07-08 DIAGNOSIS — I509 Heart failure, unspecified: Secondary | ICD-10-CM | POA: Diagnosis not present

## 2020-07-08 DIAGNOSIS — Z7982 Long term (current) use of aspirin: Secondary | ICD-10-CM | POA: Diagnosis not present

## 2020-07-08 DIAGNOSIS — I739 Peripheral vascular disease, unspecified: Secondary | ICD-10-CM | POA: Diagnosis not present

## 2020-07-08 DIAGNOSIS — I4891 Unspecified atrial fibrillation: Secondary | ICD-10-CM | POA: Diagnosis not present

## 2020-07-08 DIAGNOSIS — I10 Essential (primary) hypertension: Secondary | ICD-10-CM | POA: Diagnosis not present

## 2020-07-08 DIAGNOSIS — S31010D Laceration without foreign body of lower back and pelvis without penetration into retroperitoneum, subsequent encounter: Secondary | ICD-10-CM | POA: Diagnosis not present

## 2020-07-12 DIAGNOSIS — Z86711 Personal history of pulmonary embolism: Secondary | ICD-10-CM | POA: Diagnosis not present

## 2020-07-12 DIAGNOSIS — Z5181 Encounter for therapeutic drug level monitoring: Secondary | ICD-10-CM | POA: Diagnosis not present

## 2020-07-12 DIAGNOSIS — I6523 Occlusion and stenosis of bilateral carotid arteries: Secondary | ICD-10-CM | POA: Diagnosis not present

## 2020-07-12 DIAGNOSIS — I4821 Permanent atrial fibrillation: Secondary | ICD-10-CM | POA: Diagnosis not present

## 2020-07-12 DIAGNOSIS — I5043 Acute on chronic combined systolic (congestive) and diastolic (congestive) heart failure: Secondary | ICD-10-CM | POA: Diagnosis not present

## 2020-07-12 DIAGNOSIS — I2510 Atherosclerotic heart disease of native coronary artery without angina pectoris: Secondary | ICD-10-CM | POA: Diagnosis not present

## 2020-07-12 DIAGNOSIS — I701 Atherosclerosis of renal artery: Secondary | ICD-10-CM | POA: Diagnosis not present

## 2020-07-12 DIAGNOSIS — Z954 Presence of other heart-valve replacement: Secondary | ICD-10-CM | POA: Diagnosis not present

## 2020-07-12 DIAGNOSIS — Z7901 Long term (current) use of anticoagulants: Secondary | ICD-10-CM | POA: Diagnosis not present

## 2020-07-12 DIAGNOSIS — I110 Hypertensive heart disease with heart failure: Secondary | ICD-10-CM | POA: Diagnosis not present

## 2020-07-12 DIAGNOSIS — H3530 Unspecified macular degeneration: Secondary | ICD-10-CM | POA: Diagnosis not present

## 2020-07-12 DIAGNOSIS — Z7982 Long term (current) use of aspirin: Secondary | ICD-10-CM | POA: Diagnosis not present

## 2020-07-12 DIAGNOSIS — Z96653 Presence of artificial knee joint, bilateral: Secondary | ICD-10-CM | POA: Diagnosis not present

## 2020-07-12 DIAGNOSIS — E876 Hypokalemia: Secondary | ICD-10-CM | POA: Diagnosis not present

## 2020-07-12 DIAGNOSIS — I495 Sick sinus syndrome: Secondary | ICD-10-CM | POA: Diagnosis not present

## 2020-07-12 DIAGNOSIS — E7800 Pure hypercholesterolemia, unspecified: Secondary | ICD-10-CM | POA: Diagnosis not present

## 2020-07-12 DIAGNOSIS — K219 Gastro-esophageal reflux disease without esophagitis: Secondary | ICD-10-CM | POA: Diagnosis not present

## 2020-07-12 DIAGNOSIS — I739 Peripheral vascular disease, unspecified: Secondary | ICD-10-CM | POA: Diagnosis not present

## 2020-07-12 DIAGNOSIS — R339 Retention of urine, unspecified: Secondary | ICD-10-CM | POA: Diagnosis not present

## 2020-07-12 DIAGNOSIS — Z85828 Personal history of other malignant neoplasm of skin: Secondary | ICD-10-CM | POA: Diagnosis not present

## 2020-07-12 DIAGNOSIS — I420 Dilated cardiomyopathy: Secondary | ICD-10-CM | POA: Diagnosis not present

## 2020-07-12 DIAGNOSIS — Z8701 Personal history of pneumonia (recurrent): Secondary | ICD-10-CM | POA: Diagnosis not present

## 2020-07-12 DIAGNOSIS — I4510 Unspecified right bundle-branch block: Secondary | ICD-10-CM | POA: Diagnosis not present

## 2020-07-12 DIAGNOSIS — I872 Venous insufficiency (chronic) (peripheral): Secondary | ICD-10-CM | POA: Diagnosis not present

## 2020-07-13 DIAGNOSIS — I2510 Atherosclerotic heart disease of native coronary artery without angina pectoris: Secondary | ICD-10-CM | POA: Diagnosis not present

## 2020-07-13 DIAGNOSIS — I5043 Acute on chronic combined systolic (congestive) and diastolic (congestive) heart failure: Secondary | ICD-10-CM | POA: Diagnosis not present

## 2020-07-13 DIAGNOSIS — I4821 Permanent atrial fibrillation: Secondary | ICD-10-CM | POA: Diagnosis not present

## 2020-07-13 DIAGNOSIS — I110 Hypertensive heart disease with heart failure: Secondary | ICD-10-CM | POA: Diagnosis not present

## 2020-07-13 DIAGNOSIS — I872 Venous insufficiency (chronic) (peripheral): Secondary | ICD-10-CM | POA: Diagnosis not present

## 2020-07-13 DIAGNOSIS — I739 Peripheral vascular disease, unspecified: Secondary | ICD-10-CM | POA: Diagnosis not present

## 2020-07-14 ENCOUNTER — Ambulatory Visit (INDEPENDENT_AMBULATORY_CARE_PROVIDER_SITE_OTHER): Payer: Medicare Other | Admitting: Pharmacist

## 2020-07-14 DIAGNOSIS — I872 Venous insufficiency (chronic) (peripheral): Secondary | ICD-10-CM | POA: Diagnosis not present

## 2020-07-14 DIAGNOSIS — I4821 Permanent atrial fibrillation: Secondary | ICD-10-CM

## 2020-07-14 DIAGNOSIS — Z5181 Encounter for therapeutic drug level monitoring: Secondary | ICD-10-CM

## 2020-07-14 DIAGNOSIS — I2510 Atherosclerotic heart disease of native coronary artery without angina pectoris: Secondary | ICD-10-CM | POA: Diagnosis not present

## 2020-07-14 DIAGNOSIS — I110 Hypertensive heart disease with heart failure: Secondary | ICD-10-CM | POA: Diagnosis not present

## 2020-07-14 DIAGNOSIS — I5043 Acute on chronic combined systolic (congestive) and diastolic (congestive) heart failure: Secondary | ICD-10-CM | POA: Diagnosis not present

## 2020-07-14 DIAGNOSIS — I739 Peripheral vascular disease, unspecified: Secondary | ICD-10-CM | POA: Diagnosis not present

## 2020-07-14 LAB — POCT INR: INR: 1.5 — AB (ref 2.0–3.0)

## 2020-07-15 DIAGNOSIS — I4821 Permanent atrial fibrillation: Secondary | ICD-10-CM | POA: Diagnosis not present

## 2020-07-15 DIAGNOSIS — I110 Hypertensive heart disease with heart failure: Secondary | ICD-10-CM | POA: Diagnosis not present

## 2020-07-15 DIAGNOSIS — I5043 Acute on chronic combined systolic (congestive) and diastolic (congestive) heart failure: Secondary | ICD-10-CM | POA: Diagnosis not present

## 2020-07-15 DIAGNOSIS — I872 Venous insufficiency (chronic) (peripheral): Secondary | ICD-10-CM | POA: Diagnosis not present

## 2020-07-15 DIAGNOSIS — I2510 Atherosclerotic heart disease of native coronary artery without angina pectoris: Secondary | ICD-10-CM | POA: Diagnosis not present

## 2020-07-15 DIAGNOSIS — I739 Peripheral vascular disease, unspecified: Secondary | ICD-10-CM | POA: Diagnosis not present

## 2020-07-16 ENCOUNTER — Other Ambulatory Visit (HOSPITAL_COMMUNITY): Payer: Self-pay

## 2020-07-20 DIAGNOSIS — I4821 Permanent atrial fibrillation: Secondary | ICD-10-CM | POA: Diagnosis not present

## 2020-07-20 DIAGNOSIS — I2510 Atherosclerotic heart disease of native coronary artery without angina pectoris: Secondary | ICD-10-CM | POA: Diagnosis not present

## 2020-07-20 DIAGNOSIS — I5043 Acute on chronic combined systolic (congestive) and diastolic (congestive) heart failure: Secondary | ICD-10-CM | POA: Diagnosis not present

## 2020-07-20 DIAGNOSIS — I110 Hypertensive heart disease with heart failure: Secondary | ICD-10-CM | POA: Diagnosis not present

## 2020-07-20 DIAGNOSIS — I872 Venous insufficiency (chronic) (peripheral): Secondary | ICD-10-CM | POA: Diagnosis not present

## 2020-07-20 DIAGNOSIS — I739 Peripheral vascular disease, unspecified: Secondary | ICD-10-CM | POA: Diagnosis not present

## 2020-07-21 ENCOUNTER — Ambulatory Visit (INDEPENDENT_AMBULATORY_CARE_PROVIDER_SITE_OTHER): Payer: Medicare Other

## 2020-07-21 ENCOUNTER — Encounter: Payer: Self-pay | Admitting: Cardiovascular Disease

## 2020-07-21 ENCOUNTER — Other Ambulatory Visit: Payer: Self-pay

## 2020-07-21 ENCOUNTER — Ambulatory Visit (INDEPENDENT_AMBULATORY_CARE_PROVIDER_SITE_OTHER): Payer: Medicare Other | Admitting: Cardiovascular Disease

## 2020-07-21 VITALS — BP 120/60 | HR 76 | Ht 68.0 in | Wt 164.6 lb

## 2020-07-21 DIAGNOSIS — Z5181 Encounter for therapeutic drug level monitoring: Secondary | ICD-10-CM

## 2020-07-21 DIAGNOSIS — I739 Peripheral vascular disease, unspecified: Secondary | ICD-10-CM | POA: Diagnosis not present

## 2020-07-21 DIAGNOSIS — I4821 Permanent atrial fibrillation: Secondary | ICD-10-CM | POA: Diagnosis not present

## 2020-07-21 DIAGNOSIS — I872 Venous insufficiency (chronic) (peripheral): Secondary | ICD-10-CM | POA: Diagnosis not present

## 2020-07-21 DIAGNOSIS — I2510 Atherosclerotic heart disease of native coronary artery without angina pectoris: Secondary | ICD-10-CM | POA: Diagnosis not present

## 2020-07-21 DIAGNOSIS — I5042 Chronic combined systolic (congestive) and diastolic (congestive) heart failure: Secondary | ICD-10-CM | POA: Diagnosis not present

## 2020-07-21 DIAGNOSIS — I25119 Atherosclerotic heart disease of native coronary artery with unspecified angina pectoris: Secondary | ICD-10-CM | POA: Diagnosis not present

## 2020-07-21 DIAGNOSIS — I5043 Acute on chronic combined systolic (congestive) and diastolic (congestive) heart failure: Secondary | ICD-10-CM | POA: Diagnosis not present

## 2020-07-21 DIAGNOSIS — I110 Hypertensive heart disease with heart failure: Secondary | ICD-10-CM | POA: Diagnosis not present

## 2020-07-21 LAB — POCT INR: INR: 3.9 — AB (ref 2.0–3.0)

## 2020-07-21 NOTE — Patient Instructions (Signed)
Description   Spoke with Magda Paganini RN with [facility] and instructed patient to hold today's dosage of Warfarin, then start taking 1 tablet daily.  Stay consistent with leafy veggies. Recheck INR in 1 week by Home Health. Call if placed on any new medications or if scheduled for any procedures. [facility] [phone].

## 2020-07-21 NOTE — Patient Instructions (Signed)
Medication Instructions:  Your provider recommends that you continue on your current medications as directed. Please refer to the Current Medication list given to you today.   *If you need a refill on your cardiac medications before your next appointment, please call your pharmacy*  Lab Work: TODAY! BMET If you have labs (blood work) drawn today and your tests are completely normal, you will receive your results only by: Marland Kitchen MyChart Message (if you have MyChart) OR . A paper copy in the mail If you have any lab test that is abnormal or we need to change your treatment, we will call you to review the results.  Follow-Up: Please keep your follow-up as scheduled.

## 2020-07-21 NOTE — Progress Notes (Signed)
Cardiology Office Note:    Date:  07/21/2020   ID:  [name], DOB 08-08-1926, MRN [id number]  PCP:  Burnard Bunting, MD   [facility] Providers Cardiologist:  Sherren Mocha, MD     Referring MD: Burnard Bunting, MD   Chief Complaint  Patient presents with  . Congestive Heart Failure    History of Present Illness:    Saron Tweed is a 87 y.o. male with a hx of:  Permanent atrial fibrillation ? Coumadin anticoagulation  Coronary artery disease ? S/p POBA in the 1980s, 1990s ? Known high-grade stenosis of mid RCA; unsuccessful attempted PCIin past ? Cath 7/19: Mid RCA, 85 >>unsuccessful PCI attempt to the RCA  Peripheral arterial disease  AAA s/p repair  Aortic stenosis ? S/p TAVR 03/5636  Chronic diastolic CHF  Venous insufficiency   The patient is here with his daughter today.  He was recently hospitalized with marked volume overload and congestive heart failure.  He had been experiencing problems with urinary incontinence and had cut way back on his furosemide dosing prior to his hospitalization.  He was treated with IV diuretics and lost about 20 pounds of fluid weight.  He is feeling a lot better and notes that his leg swelling has markedly improved.  He has had a difficult transition and [place] from independent living.  He has not had any chest pain or pressure, orthopnea, or PND.  He is compliant with his medications.  He continues to have a lot of trouble with urinary incontinence and has been under some evaluation by urology.  Past Medical History:  Diagnosis Date  . A-fib ([place])   . AAA (abdominal aortic aneurysm) ([place])    s/p repair  . Aortic stenosis, mild    a. mean gradient 17 mmHg on 04/2012 TTE  . Arthritis    knees  . CAD (coronary artery disease)    a. s/p multiple percutaneous prior coronary artery interventions b. 05/2012: unsuccessful PCI to tight RCA->medically managed  . Cancer [facility])    skin cancer removed, right  hand, left leg, chest  . Carotid artery disease ([place])    0-39% bilateral ICA stenoses 11/2011  . Chronic anticoagulation    Coumadin  . Coronary artery disease   . GERD (gastroesophageal reflux disease)   . GERD (gastroesophageal reflux disease)   . History of pulmonary embolism    1964 related to dislocated hip on right   . History of skin cancer   . Hypercholesteremia   . Hypertension   . Incidental cecal mass noted on CT imaging 05/24/2016   **An incidental finding of potential clinical significance has been found. 4.6 x 5.6 x 3.7 cm masslike lesion in the cecum adjacent to the ileocecal valve may represent a large polyp or colonic neoplasm. Correlation with nonemergent colonoscopy is strongly recommended in the near future to better evaluate this finding.**  . Macular degeneration    eye injections  . Microscopic hematuria    Followed by urology  . Permanent atrial fibrillation ([place])   . PVD (peripheral vascular disease) ([place])    Bilateral renal artery atherosclerosis, SMA stenosis with collateralization  . RBBB    Chronic  . 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 SSS (sick sinus syndrome) [facility])     Past Surgical History:  Procedure Laterality Date  . ABDOMINAL AORTIC ANEURYSM REPAIR  [place]  05/24/2012   pD1 20%, oCFX 20%, mCFX 30%, ostial Int Br 30%, distal AV groove CFX 30%, pRCA 30-40%, mRCA 99%, dRCA 30%, PL branch small with diffuse 40%. Unable to pass wire past RCA lesion->medically managed  . CHOLECYSTECTOMY N/A 12/05/2012   Procedure: LAPAROSCOPIC CHOLECYSTECTOMY ;  Surgeon: Edward Jolly, MD;  Location: [place];  Service: General;  Laterality: N/A;  . CHOLECYSTECTOMY    . CORONARY ANGIOPLASTY  1980, 1990  . CORONARY BALLOON ANGIOPLASTY N/A 08/31/2017   Procedure: CORONARY BALLOON ANGIOPLASTY;  Surgeon:  Martinique, Peter M, MD;  Location: [place] CV LAB;  Service: Cardiovascular;  Laterality: N/A;  . EYE SURGERY     hx of cataract surgery   . [place]  . HERNIA REPAIR     right inguinal hernia repair   . JOINT REPLACEMENT     left knee replacement, partial right  . LEFT HEART CATH AND CORONARY ANGIOGRAPHY N/A 08/31/2017   Procedure: LEFT HEART CATH AND CORONARY ANGIOGRAPHY;  Surgeon: Martinique, Peter M, MD;  Location: [place] CV LAB;  Service: Cardiovascular;  Laterality: N/A;  . LEFT HEART CATHETERIZATION WITH CORONARY ANGIOGRAM N/A 05/24/2012   Procedure: LEFT HEART CATHETERIZATION WITH CORONARY ANGIOGRAM;  Surgeon: Burnell Blanks, MD;  Location: [facility] CATH LAB;  Service: Cardiovascular;  Laterality: N/A;  . ORTHOPEDIC SURGERY    . OTHER SURGICAL HISTORY     right knee popliteal aneurysm surgery stent placed   . OTHER SURGICAL HISTORY    . PARTIAL KNEE ARTHROPLASTY  01/22/2012   Procedure: UNICOMPARTMENTAL KNEE;  Surgeon: Mauri Pole, MD;  Location: [facility];  Service: Orthopedics;  Laterality: Right;  . PERCUTANEOUS CORONARY INTERVENTION-BALLOON ONLY  05/24/2012   Procedure: PERCUTANEOUS CORONARY INTERVENTION-BALLOON ONLY;  Surgeon: Burnell Blanks, MD;  Location: [facility] CATH LAB;  Service: Cardiovascular;;  . RIGHT/LEFT HEART CATH AND CORONARY ANGIOGRAPHY N/A 05/12/2016   Procedure: Right/Left Heart Cath and Coronary Angiography;  Surgeon: Sherren Mocha, MD;  Location: [place] CV LAB;  Service: Cardiovascular;  Laterality: N/A;  . TEE WITHOUT CARDIOVERSION N/A 06/13/2016   Procedure: TRANSESOPHAGEAL ECHOCARDIOGRAM (TEE);  Surgeon: Sherren Mocha, MD;  Location: [place];  Service: Open Heart Surgery;  Laterality: N/A;  . TONSILLECTOMY    . TOTAL KNEE ARTHROPLASTY Left   . TRANSCATHETER AORTIC VALVE REPLACEMENT, TRANSFEMORAL N/A 06/13/2016   Procedure: TRANSCATHETER AORTIC VALVE REPLACEMENT, TRANSFEMORAL;  Surgeon: Sherren Mocha, MD;  Location: [place];   Service: Open Heart Surgery;  Laterality: N/A;    Current Medications: Current Meds  Medication Sig  . aspirin EC 81 MG tablet Take 81 mg by mouth daily.  Marland Kitchen atorvastatin (LIPITOR) 20 MG tablet TAKE 1 TABLET ONCE DAILY.  . Calcium-Magnesium-Zinc (CAL-MAG-ZINC PO) Take 1 tablet by mouth daily.  . Cholecalciferol (VITAMIN D3) 5000 units TABS Take 5,000 Units by mouth daily.  . Coenzyme Q10 (COQ10) 100 MG CAPS Take 100 mg by mouth daily.  . fesoterodine (TOVIAZ) 8 MG TB24 tablet Take 8 mg by mouth every evening.  . finasteride (PROSCAR) 5 MG tablet Take 1 tablet (5 mg total) by mouth daily.  . furosemide (LASIX) 40 MG tablet Take 1 tablet (40 mg total) by mouth daily.  . isosorbide mononitrate (IMDUR) 60 MG 24 hr tablet TAKE (2) TABLETS BY MOUTH DAILY.  Marland Kitchen LORazepam (ATIVAN) 1 MG tablet Take 1 mg by mouth at bedtime.  . metoprolol succinate (TOPROL-XL) 25 MG 24 hr tablet Take 1  tablet (25 mg total) by mouth daily.  . Multiple Vitamins-Minerals (PRESERVISION AREDS) CAPS Take 1 tablet by mouth daily.  . nitroGLYCERIN (NITROSTAT) 0.4 MG SL tablet Place 0.4 mg under the tongue every 5 (five) minutes as needed for chest pain.  Vladimir Faster Glycol-Propyl Glycol (SYSTANE OP) Place 2 drops into both eyes 2 (two) times daily as needed.  . polyethylene glycol powder (GLYCOLAX/MIRALAX) 17 GM/SCOOP powder Take 17 g by mouth daily as needed for mild constipation.  . ranibizumab (LUCENTIS) 0.5 MG/0.05ML SOLN 0.5 mg by Intravitreal route See admin instructions. Every 9 weeks  . sacubitril-valsartan (ENTRESTO) 24-26 MG Take 1 tablet by mouth 2 (two) times daily.  Marland Kitchen spironolactone (ALDACTONE) 25 MG tablet Take 1 tablet (25 mg total) by mouth daily.  . tamsulosin (FLOMAX) 0.4 MG CAPS capsule Take 1 capsule (0.4 mg total) by mouth daily.  Marland Kitchen warfarin (COUMADIN) 5 MG tablet Take 0.5 tablets (2.5 mg total) by mouth daily.     Allergies:   No known allergies   Social History   Socioeconomic History  . Marital  status: Widowed    Spouse name: Not on file  . Number of children: Not on file  . Years of education: Not on file  . Highest education level: Not on file  Occupational History  . Not on file  Tobacco Use  . Smoking status: Never Smoker  . Smokeless tobacco: Never Used  Vaping Use  . Vaping Use: Never used  Substance and Sexual Activity  . Alcohol use: Not Currently    Comment: patient doesn't drink anymore  . Drug use: Never  . Sexual activity: Not on file  Other Topics Concern  . Not on file  Social History Narrative   ** Merged History Encounter **       [employer]; played football; score touchdown against [place] Determinants of Health   Financial Resource Strain: Not on file  Food Insecurity: Not on file  Transportation Needs: Not on file  Physical Activity: Not on file  Stress: Not on file  Social Connections: Not on file     Family History: The patient's family history includes Heart attack (age of onset: 90) in his mother.  ROS:   Please see the history of present illness.    All other systems reviewed and are negative.  EKGs/Labs/Other Studies Reviewed:    The following studies were reviewed today: Echo: IMPRESSIONS    1. Left ventricular ejection fraction, by estimation, is 45 to 50%. The  left ventricle has mildly decreased function. The left ventricle  demonstrates regional wall motion abnormalities (see scoring  diagram/findings for description). There is mild  concentric left ventricular hypertrophy. Left ventricular diastolic  parameters are consistent with Grade II diastolic dysfunction  (pseudonormalization). Elevated left atrial pressure. There is moderate  hypokinesis of the left ventricular, basal-mid  inferior wall and inferolateral wall.  2. Right ventricular systolic function is mildly reduced. The right  ventricular size is normal. There is mildly elevated pulmonary artery  systolic pressure.  3. Left atrial size  was severely dilated.  4. Right atrial size was moderately dilated.  5. The mitral valve is normal in structure. Mild to moderate mitral valve  regurgitation.  6. The aortic valve has been repaired/replaced. Aortic valve  regurgitation is trivial. No aortic stenosis is present. There is a 29 mm  Sapien prosthetic (TAVR) valve present in the aortic position. Aortic  valve mean gradient measures 6.0 mmHg. Aortic  valve  Vmax measures 1.82 m/s.  7. The inferior vena cava is dilated in size with <50% respiratory  variability, suggesting right atrial pressure of 15 mmHg.   Comparison(s): Prior images unable to be directly viewed, comparison made  by report only. The left ventricular function is worsened. The left  ventricular wall motion abnormality is unchanged.   EKG:  EKG is not ordered today.    Recent Labs: 06/12/2020: ALT 25; B Natriuretic Peptide 1,215.1 06/21/2020: Hemoglobin 13.2; Magnesium 2.3; Platelets 191 06/28/2020: BUN 26; Creatinine, Ser 0.96; Potassium 4.3; Sodium 136  Recent Lipid Panel    Component Value Date/Time   CHOL 149 08/28/2017 0350   TRIG 95 08/28/2017 0350   HDL 52 08/28/2017 0350   CHOLHDL 2.9 08/28/2017 0350   VLDL 19 08/28/2017 0350   LDLCALC 78 08/28/2017 0350     Risk Assessment/Calculations:    CHA2DS2-VASc Score = 5  This indicates a 7.2% annual risk of stroke. The patient's score is based upon: CHF History: Yes HTN History: Yes Diabetes History: No Stroke History: No Vascular Disease History: Yes Age Score: 2 Gender Score: 0      Physical Exam:    VS:  BP 120/60   Pulse 76   Ht 5\' 8"  (1.727 m)   Wt 164 lb 9.6 oz (74.7 kg)   SpO2 94%   BMI 25.03 kg/m     Wt Readings from Last 3 Encounters:  07/21/20 164 lb 9.6 oz (74.7 kg)  06/28/20 168 lb 12.8 oz (76.6 kg)  06/21/20 159 lb 2.8 oz (72.2 kg)     GEN: Pleasant elderly male in no acute distress HEENT: Normal NECK: No JVD; No carotid bruits LYMPHATICS: No  lymphadenopathy CARDIAC: Irregularly irregular with grade 2/6 systolic murmur at the right upper sternal border RESPIRATORY:  Clear to auscultation without rales, wheezing or rhonchi  ABDOMEN: Soft, non-tender, non-distended MUSCULOSKELETAL: Mild bilateral 1+ pretibial edema; No deformity  SKIN: Warm and dry, chronic stasis changes both legs NEUROLOGIC:  Alert and oriented x 3 PSYCHIATRIC:  Normal affect   ASSESSMENT:    1. Chronic combined systolic and diastolic congestive heart failure ([place])   2. Permanent atrial fibrillation ([place])   3. Chronic combined systolic and diastolic heart failure ([place])   4. Coronary artery disease involving native coronary artery of native heart without angina pectoris    PLAN:    In order of problems listed above:  1. The patient is improved after his recent hospitalization with IV diuresis.  While he is having a difficult time with urinary incontinence, I think it is necessary to keep him on a loop diuretic at his current dose of furosemide 40 mg daily.  He otherwise will continue his current medical management which includes Entresto, spironolactone, metoprolol succinate, and furosemide. 2. Anticoagulated with warfarin long-term.  Heart rate controlled. 3. As above 4. Continue aspirin for antiplatelet therapy.  Continue atorvastatin.        Medication Adjustments/Labs and Tests Ordered: Current medicines are reviewed at length with the patient today.  Concerns regarding medicines are outlined above.  Orders Placed This Encounter  Procedures  . Basic metabolic panel   No orders of the defined types were placed in this encounter.   Patient Instructions  Medication Instructions:  Your provider recommends that you continue on your current medications as directed. Please refer to the Current Medication list given to you today.   *If you need a refill on your cardiac medications before your next appointment, please call your pharmacy*  Lab  Work: TODAY! BMET If you have labs (blood work) drawn today and your tests are completely normal, you will receive your results only by: Marland Kitchen MyChart Message (if you have MyChart) OR . A paper copy in the mail If you have any lab test that is abnormal or we need to change your treatment, we will call you to review the results.  Follow-Up: Please keep your follow-up as scheduled.     Signed, Sherren Mocha, MD  07/21/2020 3:09 PM    [facility]

## 2020-07-22 ENCOUNTER — Other Ambulatory Visit: Payer: Self-pay

## 2020-07-22 DIAGNOSIS — R338 Other retention of urine: Secondary | ICD-10-CM | POA: Diagnosis not present

## 2020-07-22 DIAGNOSIS — N3000 Acute cystitis without hematuria: Secondary | ICD-10-CM | POA: Diagnosis not present

## 2020-07-22 LAB — BASIC METABOLIC PANEL
BUN/Creatinine Ratio: 23 (ref 10–24)
BUN: 24 mg/dL (ref 10–36)
CO2: 22 mmol/L (ref 20–29)
Calcium: 8.7 mg/dL (ref 8.6–10.2)
Chloride: 102 mmol/L (ref 96–106)
Creatinine, Ser: 1.06 mg/dL (ref 0.76–1.27)
Glucose: 116 mg/dL — ABNORMAL HIGH (ref 65–99)
Potassium: 4.7 mmol/L (ref 3.5–5.2)
Sodium: 135 mmol/L (ref 134–144)
eGFR: 66 mL/min/{1.73_m2} (ref >59)

## 2020-07-22 MED ORDER — SACUBITRIL-VALSARTAN 24-26 MG PO TABS: 1.0000 | ORAL_TABLET | Freq: Two times a day (BID) | ORAL | 11 refills | Status: DC

## 2020-07-22 NOTE — Telephone Encounter (Signed)
Pt's medication was sent to pt's pharmacy as requested. Confirmation received.  °

## 2020-07-23 ENCOUNTER — Encounter: Payer: Self-pay | Admitting: Podiatry

## 2020-07-23 ENCOUNTER — Ambulatory Visit (INDEPENDENT_AMBULATORY_CARE_PROVIDER_SITE_OTHER): Payer: Medicare Other | Admitting: Podiatry

## 2020-07-23 ENCOUNTER — Other Ambulatory Visit: Payer: Self-pay

## 2020-07-23 DIAGNOSIS — I739 Peripheral vascular disease, unspecified: Secondary | ICD-10-CM | POA: Diagnosis not present

## 2020-07-23 DIAGNOSIS — I110 Hypertensive heart disease with heart failure: Secondary | ICD-10-CM | POA: Diagnosis not present

## 2020-07-23 DIAGNOSIS — S51011A Laceration without foreign body of right elbow, initial encounter: Secondary | ICD-10-CM | POA: Insufficient documentation

## 2020-07-23 DIAGNOSIS — M79675 Pain in left toe(s): Secondary | ICD-10-CM

## 2020-07-23 DIAGNOSIS — W19XXXA Unspecified fall, initial encounter: Secondary | ICD-10-CM | POA: Insufficient documentation

## 2020-07-23 DIAGNOSIS — W57XXXA Bitten or stung by nonvenomous insect and other nonvenomous arthropods, initial encounter: Secondary | ICD-10-CM | POA: Insufficient documentation

## 2020-07-23 DIAGNOSIS — I2510 Atherosclerotic heart disease of native coronary artery without angina pectoris: Secondary | ICD-10-CM | POA: Diagnosis not present

## 2020-07-23 DIAGNOSIS — M79674 Pain in right toe(s): Secondary | ICD-10-CM | POA: Diagnosis not present

## 2020-07-23 DIAGNOSIS — I4821 Permanent atrial fibrillation: Secondary | ICD-10-CM | POA: Diagnosis not present

## 2020-07-23 DIAGNOSIS — I872 Venous insufficiency (chronic) (peripheral): Secondary | ICD-10-CM | POA: Diagnosis not present

## 2020-07-23 DIAGNOSIS — H9190 Unspecified hearing loss, unspecified ear: Secondary | ICD-10-CM | POA: Insufficient documentation

## 2020-07-23 DIAGNOSIS — B351 Tinea unguium: Secondary | ICD-10-CM | POA: Diagnosis not present

## 2020-07-23 DIAGNOSIS — S20419A Abrasion of unspecified back wall of thorax, initial encounter: Secondary | ICD-10-CM | POA: Insufficient documentation

## 2020-07-23 DIAGNOSIS — I5043 Acute on chronic combined systolic (congestive) and diastolic (congestive) heart failure: Secondary | ICD-10-CM | POA: Diagnosis not present

## 2020-07-26 DIAGNOSIS — Z1159 Encounter for screening for other viral diseases: Secondary | ICD-10-CM | POA: Diagnosis not present

## 2020-07-26 DIAGNOSIS — Z20828 Contact with and (suspected) exposure to other viral communicable diseases: Secondary | ICD-10-CM | POA: Diagnosis not present

## 2020-07-27 DIAGNOSIS — I739 Peripheral vascular disease, unspecified: Secondary | ICD-10-CM | POA: Diagnosis not present

## 2020-07-27 DIAGNOSIS — I4821 Permanent atrial fibrillation: Secondary | ICD-10-CM | POA: Diagnosis not present

## 2020-07-27 DIAGNOSIS — I110 Hypertensive heart disease with heart failure: Secondary | ICD-10-CM | POA: Diagnosis not present

## 2020-07-27 DIAGNOSIS — I872 Venous insufficiency (chronic) (peripheral): Secondary | ICD-10-CM | POA: Diagnosis not present

## 2020-07-27 DIAGNOSIS — I2510 Atherosclerotic heart disease of native coronary artery without angina pectoris: Secondary | ICD-10-CM | POA: Diagnosis not present

## 2020-07-27 DIAGNOSIS — I5043 Acute on chronic combined systolic (congestive) and diastolic (congestive) heart failure: Secondary | ICD-10-CM | POA: Diagnosis not present

## 2020-07-28 ENCOUNTER — Ambulatory Visit (INDEPENDENT_AMBULATORY_CARE_PROVIDER_SITE_OTHER): Payer: Medicare Other

## 2020-07-28 DIAGNOSIS — Z5181 Encounter for therapeutic drug level monitoring: Secondary | ICD-10-CM | POA: Diagnosis not present

## 2020-07-28 DIAGNOSIS — I5043 Acute on chronic combined systolic (congestive) and diastolic (congestive) heart failure: Secondary | ICD-10-CM | POA: Diagnosis not present

## 2020-07-28 DIAGNOSIS — I872 Venous insufficiency (chronic) (peripheral): Secondary | ICD-10-CM | POA: Diagnosis not present

## 2020-07-28 DIAGNOSIS — I2510 Atherosclerotic heart disease of native coronary artery without angina pectoris: Secondary | ICD-10-CM | POA: Diagnosis not present

## 2020-07-28 DIAGNOSIS — I4821 Permanent atrial fibrillation: Secondary | ICD-10-CM | POA: Diagnosis not present

## 2020-07-28 DIAGNOSIS — I739 Peripheral vascular disease, unspecified: Secondary | ICD-10-CM | POA: Diagnosis not present

## 2020-07-28 DIAGNOSIS — I110 Hypertensive heart disease with heart failure: Secondary | ICD-10-CM | POA: Diagnosis not present

## 2020-07-28 LAB — POCT INR: INR: 2.6 (ref 2.0–3.0)

## 2020-07-28 NOTE — Patient Instructions (Signed)
Description   Spoke with Magda Paganini RN with [facility] and instructed patient to continue on same dosage of Warfarin 1 tablet daily.  Stay consistent with leafy veggies. Recheck INR on Friday given start of Cipro 250mg  BID x 5 days on 07/27/20.  Call if placed on any new medications or if scheduled for any procedures. Cou[facility]in Clinic [phone].

## 2020-07-28 NOTE — Progress Notes (Signed)
Subjective: Tyler Williams is a pleasant 87 y.o. male patient seen today painful thick toenails that are difficult to trim. Pain interferes with ambulation. Aggravating factors include wearing enclosed shoe gear. Pain is relieved with periodic professional debridement.  His daughter is present during today's visit. He missed his last appointment with [place] due to hospitalization.  He notes no new pedal problems on today's visit.  PCP is Burnard Bunting, MD. Last visit was: 05/31/2020.  Allergies  Allergen Reactions   No Known Allergies Other (See Comments)    NKA    Objective: Physical Exam  General: Tyler Williams is a pleasant 87 y.o. Caucasian male, WD, WN in NAD. AAO x 3.   Vascular:  Capillary fill time to digits <3 seconds b/l lower extremities. Palpable pedal pulses b/l LE. Pedal hair absent. Lower extremity skin temperature gradient within normal limits. No pain with calf compression b/l.  Dermatological:  Pedal skin with normal turgor, texture and tone bilaterally. No open wounds bilaterally. No interdigital macerations bilaterally. Toenails 1-5 b/l elongated, discolored, dystrophic, thickened, crumbly with subungual debris and tenderness to dorsal palpation. Incurvated nailplate lateral border(s) R 3rd toe.  Nail border hypertrophy minimal. There is tenderness to palpation. Sign(s) of infection: no clinical signs of infection noted on examination today..  Musculoskeletal:  Normal muscle strength 5/5 to all lower extremity muscle groups bilaterally. No pain crepitus or joint limitation noted with ROM b/l. No gross bony deformities bilaterally.  Neurological:  Protective sensation intact 5/5 intact bilaterally with 10g monofilament b/l.  Assessment and Plan:  1. Pain due to onychomycosis of toenails of both feet     -Examined patient. -Patient to continue soft, supportive shoe gear daily. -Toenails 1-5 b/l were debrided in length and girth with sterile nail  nippers and dremel without iatrogenic bleeding.  -Offending nail border debrided and curretaged R 3rd toe utilizing sterile nail nipper and currette. Border cleansed with alcohol and triple antibiotic applied. No further treatment required by patient/caregiver. -Patient to report any pedal injuries to medical professional immediately. -Patient/POA to call should there be question/concern in the interim.  Return in about 3 months (around 10/23/2020).  Marzetta Board, DPM

## 2020-07-30 ENCOUNTER — Ambulatory Visit (INDEPENDENT_AMBULATORY_CARE_PROVIDER_SITE_OTHER): Payer: Medicare Other | Admitting: Pharmacist

## 2020-07-30 DIAGNOSIS — I5043 Acute on chronic combined systolic (congestive) and diastolic (congestive) heart failure: Secondary | ICD-10-CM | POA: Diagnosis not present

## 2020-07-30 DIAGNOSIS — Z5181 Encounter for therapeutic drug level monitoring: Secondary | ICD-10-CM

## 2020-07-30 DIAGNOSIS — I110 Hypertensive heart disease with heart failure: Secondary | ICD-10-CM | POA: Diagnosis not present

## 2020-07-30 DIAGNOSIS — G5632 Lesion of radial nerve, left upper limb: Secondary | ICD-10-CM | POA: Diagnosis not present

## 2020-07-30 DIAGNOSIS — I739 Peripheral vascular disease, unspecified: Secondary | ICD-10-CM | POA: Diagnosis not present

## 2020-07-30 DIAGNOSIS — I2510 Atherosclerotic heart disease of native coronary artery without angina pectoris: Secondary | ICD-10-CM | POA: Diagnosis not present

## 2020-07-30 DIAGNOSIS — I4821 Permanent atrial fibrillation: Secondary | ICD-10-CM

## 2020-07-30 DIAGNOSIS — I872 Venous insufficiency (chronic) (peripheral): Secondary | ICD-10-CM | POA: Diagnosis not present

## 2020-07-30 DIAGNOSIS — M542 Cervicalgia: Secondary | ICD-10-CM | POA: Diagnosis not present

## 2020-07-30 LAB — POCT INR: INR: 3.3 — AB (ref 2.0–3.0)

## 2020-08-02 DIAGNOSIS — Z85828 Personal history of other malignant neoplasm of skin: Secondary | ICD-10-CM | POA: Diagnosis not present

## 2020-08-02 DIAGNOSIS — I2510 Atherosclerotic heart disease of native coronary artery without angina pectoris: Secondary | ICD-10-CM | POA: Diagnosis not present

## 2020-08-02 DIAGNOSIS — C44319 Basal cell carcinoma of skin of other parts of face: Secondary | ICD-10-CM | POA: Diagnosis not present

## 2020-08-02 DIAGNOSIS — L570 Actinic keratosis: Secondary | ICD-10-CM | POA: Diagnosis not present

## 2020-08-02 DIAGNOSIS — D485 Neoplasm of uncertain behavior of skin: Secondary | ICD-10-CM | POA: Diagnosis not present

## 2020-08-02 DIAGNOSIS — L814 Other melanin hyperpigmentation: Secondary | ICD-10-CM | POA: Diagnosis not present

## 2020-08-02 DIAGNOSIS — L821 Other seborrheic keratosis: Secondary | ICD-10-CM | POA: Diagnosis not present

## 2020-08-02 DIAGNOSIS — I739 Peripheral vascular disease, unspecified: Secondary | ICD-10-CM | POA: Diagnosis not present

## 2020-08-02 DIAGNOSIS — I5043 Acute on chronic combined systolic (congestive) and diastolic (congestive) heart failure: Secondary | ICD-10-CM | POA: Diagnosis not present

## 2020-08-02 DIAGNOSIS — I872 Venous insufficiency (chronic) (peripheral): Secondary | ICD-10-CM | POA: Diagnosis not present

## 2020-08-02 DIAGNOSIS — I110 Hypertensive heart disease with heart failure: Secondary | ICD-10-CM | POA: Diagnosis not present

## 2020-08-02 DIAGNOSIS — I4821 Permanent atrial fibrillation: Secondary | ICD-10-CM | POA: Diagnosis not present

## 2020-08-03 DIAGNOSIS — I4821 Permanent atrial fibrillation: Secondary | ICD-10-CM | POA: Diagnosis not present

## 2020-08-03 DIAGNOSIS — I2510 Atherosclerotic heart disease of native coronary artery without angina pectoris: Secondary | ICD-10-CM | POA: Diagnosis not present

## 2020-08-03 DIAGNOSIS — I110 Hypertensive heart disease with heart failure: Secondary | ICD-10-CM | POA: Diagnosis not present

## 2020-08-03 DIAGNOSIS — I739 Peripheral vascular disease, unspecified: Secondary | ICD-10-CM | POA: Diagnosis not present

## 2020-08-03 DIAGNOSIS — I5043 Acute on chronic combined systolic (congestive) and diastolic (congestive) heart failure: Secondary | ICD-10-CM | POA: Diagnosis not present

## 2020-08-03 DIAGNOSIS — I872 Venous insufficiency (chronic) (peripheral): Secondary | ICD-10-CM | POA: Diagnosis not present

## 2020-08-04 ENCOUNTER — Ambulatory Visit (INDEPENDENT_AMBULATORY_CARE_PROVIDER_SITE_OTHER): Payer: Medicare Other | Admitting: Pharmacist

## 2020-08-04 DIAGNOSIS — I110 Hypertensive heart disease with heart failure: Secondary | ICD-10-CM | POA: Diagnosis not present

## 2020-08-04 DIAGNOSIS — I5043 Acute on chronic combined systolic (congestive) and diastolic (congestive) heart failure: Secondary | ICD-10-CM | POA: Diagnosis not present

## 2020-08-04 DIAGNOSIS — I4821 Permanent atrial fibrillation: Secondary | ICD-10-CM

## 2020-08-04 DIAGNOSIS — I872 Venous insufficiency (chronic) (peripheral): Secondary | ICD-10-CM | POA: Diagnosis not present

## 2020-08-04 DIAGNOSIS — I2510 Atherosclerotic heart disease of native coronary artery without angina pectoris: Secondary | ICD-10-CM | POA: Diagnosis not present

## 2020-08-04 DIAGNOSIS — Z5181 Encounter for therapeutic drug level monitoring: Secondary | ICD-10-CM

## 2020-08-04 DIAGNOSIS — I739 Peripheral vascular disease, unspecified: Secondary | ICD-10-CM | POA: Diagnosis not present

## 2020-08-04 LAB — POCT INR: INR: 2.2 (ref 2.0–3.0)

## 2020-08-04 NOTE — Patient Instructions (Signed)
Description   Spoke with Magda Paganini RN with [facility] and instructed patient to continue on same dosage of Warfarin 1 tablet daily. Recheck INR in 3-4 weeks in office since [facility] is discharging pt. Called pt's daughter [name] to schedule this. Call if placed on any new medications or if scheduled for any procedures. Coumadin Clinic [phone].

## 2020-08-05 DIAGNOSIS — I2510 Atherosclerotic heart disease of native coronary artery without angina pectoris: Secondary | ICD-10-CM | POA: Diagnosis not present

## 2020-08-05 DIAGNOSIS — I110 Hypertensive heart disease with heart failure: Secondary | ICD-10-CM | POA: Diagnosis not present

## 2020-08-05 DIAGNOSIS — I4821 Permanent atrial fibrillation: Secondary | ICD-10-CM | POA: Diagnosis not present

## 2020-08-05 DIAGNOSIS — I739 Peripheral vascular disease, unspecified: Secondary | ICD-10-CM | POA: Diagnosis not present

## 2020-08-05 DIAGNOSIS — I872 Venous insufficiency (chronic) (peripheral): Secondary | ICD-10-CM | POA: Diagnosis not present

## 2020-08-05 DIAGNOSIS — I5043 Acute on chronic combined systolic (congestive) and diastolic (congestive) heart failure: Secondary | ICD-10-CM | POA: Diagnosis not present

## 2020-08-09 DIAGNOSIS — Z20828 Contact with and (suspected) exposure to other viral communicable diseases: Secondary | ICD-10-CM | POA: Diagnosis not present

## 2020-08-09 DIAGNOSIS — Z1159 Encounter for screening for other viral diseases: Secondary | ICD-10-CM | POA: Diagnosis not present

## 2020-08-10 ENCOUNTER — Ambulatory Visit: Payer: Medicare Other | Admitting: Podiatry

## 2020-08-10 DIAGNOSIS — I5043 Acute on chronic combined systolic (congestive) and diastolic (congestive) heart failure: Secondary | ICD-10-CM | POA: Diagnosis not present

## 2020-08-10 DIAGNOSIS — I739 Peripheral vascular disease, unspecified: Secondary | ICD-10-CM | POA: Diagnosis not present

## 2020-08-10 DIAGNOSIS — I4821 Permanent atrial fibrillation: Secondary | ICD-10-CM | POA: Diagnosis not present

## 2020-08-10 DIAGNOSIS — I110 Hypertensive heart disease with heart failure: Secondary | ICD-10-CM | POA: Diagnosis not present

## 2020-08-10 DIAGNOSIS — I2510 Atherosclerotic heart disease of native coronary artery without angina pectoris: Secondary | ICD-10-CM | POA: Diagnosis not present

## 2020-08-10 DIAGNOSIS — I872 Venous insufficiency (chronic) (peripheral): Secondary | ICD-10-CM | POA: Diagnosis not present

## 2020-08-11 DIAGNOSIS — I739 Peripheral vascular disease, unspecified: Secondary | ICD-10-CM | POA: Diagnosis not present

## 2020-08-11 DIAGNOSIS — Z954 Presence of other heart-valve replacement: Secondary | ICD-10-CM | POA: Diagnosis not present

## 2020-08-11 DIAGNOSIS — Z85828 Personal history of other malignant neoplasm of skin: Secondary | ICD-10-CM | POA: Diagnosis not present

## 2020-08-11 DIAGNOSIS — I495 Sick sinus syndrome: Secondary | ICD-10-CM | POA: Diagnosis not present

## 2020-08-11 DIAGNOSIS — I5043 Acute on chronic combined systolic (congestive) and diastolic (congestive) heart failure: Secondary | ICD-10-CM | POA: Diagnosis not present

## 2020-08-11 DIAGNOSIS — I872 Venous insufficiency (chronic) (peripheral): Secondary | ICD-10-CM | POA: Diagnosis not present

## 2020-08-11 DIAGNOSIS — Z7982 Long term (current) use of aspirin: Secondary | ICD-10-CM | POA: Diagnosis not present

## 2020-08-11 DIAGNOSIS — Z86711 Personal history of pulmonary embolism: Secondary | ICD-10-CM | POA: Diagnosis not present

## 2020-08-11 DIAGNOSIS — K219 Gastro-esophageal reflux disease without esophagitis: Secondary | ICD-10-CM | POA: Diagnosis not present

## 2020-08-11 DIAGNOSIS — Z7901 Long term (current) use of anticoagulants: Secondary | ICD-10-CM | POA: Diagnosis not present

## 2020-08-11 DIAGNOSIS — E7800 Pure hypercholesterolemia, unspecified: Secondary | ICD-10-CM | POA: Diagnosis not present

## 2020-08-11 DIAGNOSIS — I110 Hypertensive heart disease with heart failure: Secondary | ICD-10-CM | POA: Diagnosis not present

## 2020-08-11 DIAGNOSIS — H3530 Unspecified macular degeneration: Secondary | ICD-10-CM | POA: Diagnosis not present

## 2020-08-11 DIAGNOSIS — Z5181 Encounter for therapeutic drug level monitoring: Secondary | ICD-10-CM | POA: Diagnosis not present

## 2020-08-11 DIAGNOSIS — E876 Hypokalemia: Secondary | ICD-10-CM | POA: Diagnosis not present

## 2020-08-11 DIAGNOSIS — Z8701 Personal history of pneumonia (recurrent): Secondary | ICD-10-CM | POA: Diagnosis not present

## 2020-08-11 DIAGNOSIS — I4510 Unspecified right bundle-branch block: Secondary | ICD-10-CM | POA: Diagnosis not present

## 2020-08-11 DIAGNOSIS — I2510 Atherosclerotic heart disease of native coronary artery without angina pectoris: Secondary | ICD-10-CM | POA: Diagnosis not present

## 2020-08-11 DIAGNOSIS — I6523 Occlusion and stenosis of bilateral carotid arteries: Secondary | ICD-10-CM | POA: Diagnosis not present

## 2020-08-11 DIAGNOSIS — Z96653 Presence of artificial knee joint, bilateral: Secondary | ICD-10-CM | POA: Diagnosis not present

## 2020-08-11 DIAGNOSIS — I701 Atherosclerosis of renal artery: Secondary | ICD-10-CM | POA: Diagnosis not present

## 2020-08-11 DIAGNOSIS — I420 Dilated cardiomyopathy: Secondary | ICD-10-CM | POA: Diagnosis not present

## 2020-08-11 DIAGNOSIS — R339 Retention of urine, unspecified: Secondary | ICD-10-CM | POA: Diagnosis not present

## 2020-08-11 DIAGNOSIS — I4821 Permanent atrial fibrillation: Secondary | ICD-10-CM | POA: Diagnosis not present

## 2020-08-12 DIAGNOSIS — I4821 Permanent atrial fibrillation: Secondary | ICD-10-CM | POA: Diagnosis not present

## 2020-08-12 DIAGNOSIS — I2510 Atherosclerotic heart disease of native coronary artery without angina pectoris: Secondary | ICD-10-CM | POA: Diagnosis not present

## 2020-08-12 DIAGNOSIS — I110 Hypertensive heart disease with heart failure: Secondary | ICD-10-CM | POA: Diagnosis not present

## 2020-08-12 DIAGNOSIS — I872 Venous insufficiency (chronic) (peripheral): Secondary | ICD-10-CM | POA: Diagnosis not present

## 2020-08-12 DIAGNOSIS — M79642 Pain in left hand: Secondary | ICD-10-CM | POA: Insufficient documentation

## 2020-08-12 DIAGNOSIS — I739 Peripheral vascular disease, unspecified: Secondary | ICD-10-CM | POA: Diagnosis not present

## 2020-08-12 DIAGNOSIS — G5632 Lesion of radial nerve, left upper limb: Secondary | ICD-10-CM | POA: Diagnosis not present

## 2020-08-12 DIAGNOSIS — I5043 Acute on chronic combined systolic (congestive) and diastolic (congestive) heart failure: Secondary | ICD-10-CM | POA: Diagnosis not present

## 2020-08-13 DIAGNOSIS — I4821 Permanent atrial fibrillation: Secondary | ICD-10-CM | POA: Diagnosis not present

## 2020-08-13 DIAGNOSIS — I5043 Acute on chronic combined systolic (congestive) and diastolic (congestive) heart failure: Secondary | ICD-10-CM | POA: Diagnosis not present

## 2020-08-13 DIAGNOSIS — I110 Hypertensive heart disease with heart failure: Secondary | ICD-10-CM | POA: Diagnosis not present

## 2020-08-13 DIAGNOSIS — G5632 Lesion of radial nerve, left upper limb: Secondary | ICD-10-CM | POA: Insufficient documentation

## 2020-08-13 DIAGNOSIS — I739 Peripheral vascular disease, unspecified: Secondary | ICD-10-CM | POA: Diagnosis not present

## 2020-08-13 DIAGNOSIS — I872 Venous insufficiency (chronic) (peripheral): Secondary | ICD-10-CM | POA: Diagnosis not present

## 2020-08-13 DIAGNOSIS — I2510 Atherosclerotic heart disease of native coronary artery without angina pectoris: Secondary | ICD-10-CM | POA: Diagnosis not present

## 2020-08-16 DIAGNOSIS — Z20828 Contact with and (suspected) exposure to other viral communicable diseases: Secondary | ICD-10-CM | POA: Diagnosis not present

## 2020-08-16 DIAGNOSIS — Z1159 Encounter for screening for other viral diseases: Secondary | ICD-10-CM | POA: Diagnosis not present

## 2020-08-17 DIAGNOSIS — I110 Hypertensive heart disease with heart failure: Secondary | ICD-10-CM | POA: Diagnosis not present

## 2020-08-17 DIAGNOSIS — I2510 Atherosclerotic heart disease of native coronary artery without angina pectoris: Secondary | ICD-10-CM | POA: Diagnosis not present

## 2020-08-17 DIAGNOSIS — I872 Venous insufficiency (chronic) (peripheral): Secondary | ICD-10-CM | POA: Diagnosis not present

## 2020-08-17 DIAGNOSIS — I5043 Acute on chronic combined systolic (congestive) and diastolic (congestive) heart failure: Secondary | ICD-10-CM | POA: Diagnosis not present

## 2020-08-17 DIAGNOSIS — I739 Peripheral vascular disease, unspecified: Secondary | ICD-10-CM | POA: Diagnosis not present

## 2020-08-17 DIAGNOSIS — I4821 Permanent atrial fibrillation: Secondary | ICD-10-CM | POA: Diagnosis not present

## 2020-08-19 DIAGNOSIS — I5021 Acute systolic (congestive) heart failure: Secondary | ICD-10-CM | POA: Diagnosis not present

## 2020-08-19 DIAGNOSIS — Z961 Presence of intraocular lens: Secondary | ICD-10-CM | POA: Diagnosis not present

## 2020-08-19 DIAGNOSIS — N19 Unspecified kidney failure: Secondary | ICD-10-CM | POA: Diagnosis not present

## 2020-08-19 DIAGNOSIS — H353231 Exudative age-related macular degeneration, bilateral, with active choroidal neovascularization: Secondary | ICD-10-CM | POA: Diagnosis not present

## 2020-08-19 DIAGNOSIS — Z954 Presence of other heart-valve replacement: Secondary | ICD-10-CM | POA: Diagnosis not present

## 2020-08-19 DIAGNOSIS — Z7901 Long term (current) use of anticoagulants: Secondary | ICD-10-CM | POA: Diagnosis not present

## 2020-08-20 DIAGNOSIS — I4821 Permanent atrial fibrillation: Secondary | ICD-10-CM | POA: Diagnosis not present

## 2020-08-20 DIAGNOSIS — I2510 Atherosclerotic heart disease of native coronary artery without angina pectoris: Secondary | ICD-10-CM | POA: Diagnosis not present

## 2020-08-20 DIAGNOSIS — I110 Hypertensive heart disease with heart failure: Secondary | ICD-10-CM | POA: Diagnosis not present

## 2020-08-20 DIAGNOSIS — I739 Peripheral vascular disease, unspecified: Secondary | ICD-10-CM | POA: Diagnosis not present

## 2020-08-20 DIAGNOSIS — I872 Venous insufficiency (chronic) (peripheral): Secondary | ICD-10-CM | POA: Diagnosis not present

## 2020-08-20 DIAGNOSIS — I5043 Acute on chronic combined systolic (congestive) and diastolic (congestive) heart failure: Secondary | ICD-10-CM | POA: Diagnosis not present

## 2020-08-25 DIAGNOSIS — I110 Hypertensive heart disease with heart failure: Secondary | ICD-10-CM | POA: Diagnosis not present

## 2020-08-25 DIAGNOSIS — I2510 Atherosclerotic heart disease of native coronary artery without angina pectoris: Secondary | ICD-10-CM | POA: Diagnosis not present

## 2020-08-25 DIAGNOSIS — I5043 Acute on chronic combined systolic (congestive) and diastolic (congestive) heart failure: Secondary | ICD-10-CM | POA: Diagnosis not present

## 2020-08-25 DIAGNOSIS — I872 Venous insufficiency (chronic) (peripheral): Secondary | ICD-10-CM | POA: Diagnosis not present

## 2020-08-25 DIAGNOSIS — I739 Peripheral vascular disease, unspecified: Secondary | ICD-10-CM | POA: Diagnosis not present

## 2020-08-25 DIAGNOSIS — I4821 Permanent atrial fibrillation: Secondary | ICD-10-CM | POA: Diagnosis not present

## 2020-08-26 DIAGNOSIS — I110 Hypertensive heart disease with heart failure: Secondary | ICD-10-CM | POA: Diagnosis not present

## 2020-08-26 DIAGNOSIS — I5043 Acute on chronic combined systolic (congestive) and diastolic (congestive) heart failure: Secondary | ICD-10-CM | POA: Diagnosis not present

## 2020-08-26 DIAGNOSIS — I872 Venous insufficiency (chronic) (peripheral): Secondary | ICD-10-CM | POA: Diagnosis not present

## 2020-08-26 DIAGNOSIS — I739 Peripheral vascular disease, unspecified: Secondary | ICD-10-CM | POA: Diagnosis not present

## 2020-08-26 DIAGNOSIS — I2510 Atherosclerotic heart disease of native coronary artery without angina pectoris: Secondary | ICD-10-CM | POA: Diagnosis not present

## 2020-08-26 DIAGNOSIS — I4821 Permanent atrial fibrillation: Secondary | ICD-10-CM | POA: Diagnosis not present

## 2020-08-30 ENCOUNTER — Telehealth: Payer: Self-pay

## 2020-08-30 DIAGNOSIS — Z20828 Contact with and (suspected) exposure to other viral communicable diseases: Secondary | ICD-10-CM | POA: Diagnosis not present

## 2020-08-30 DIAGNOSIS — Z1159 Encounter for screening for other viral diseases: Secondary | ICD-10-CM | POA: Diagnosis not present

## 2020-08-30 NOTE — Telephone Encounter (Signed)
Called pt to r/s missed INR appt for today.  Spoke w/ pt's son, [name].  He reports that pt is at [facility] and they are managing his INR.  He will double check to make sure this is being monitored and call back if we can be of assistance.

## 2020-08-31 DIAGNOSIS — I872 Venous insufficiency (chronic) (peripheral): Secondary | ICD-10-CM | POA: Diagnosis not present

## 2020-08-31 DIAGNOSIS — I739 Peripheral vascular disease, unspecified: Secondary | ICD-10-CM | POA: Diagnosis not present

## 2020-08-31 DIAGNOSIS — I5043 Acute on chronic combined systolic (congestive) and diastolic (congestive) heart failure: Secondary | ICD-10-CM | POA: Diagnosis not present

## 2020-08-31 DIAGNOSIS — I110 Hypertensive heart disease with heart failure: Secondary | ICD-10-CM | POA: Diagnosis not present

## 2020-08-31 DIAGNOSIS — I2510 Atherosclerotic heart disease of native coronary artery without angina pectoris: Secondary | ICD-10-CM | POA: Diagnosis not present

## 2020-08-31 DIAGNOSIS — I4821 Permanent atrial fibrillation: Secondary | ICD-10-CM | POA: Diagnosis not present

## 2020-09-01 DIAGNOSIS — I110 Hypertensive heart disease with heart failure: Secondary | ICD-10-CM | POA: Diagnosis not present

## 2020-09-01 DIAGNOSIS — L570 Actinic keratosis: Secondary | ICD-10-CM | POA: Diagnosis not present

## 2020-09-01 DIAGNOSIS — Z85828 Personal history of other malignant neoplasm of skin: Secondary | ICD-10-CM | POA: Diagnosis not present

## 2020-09-01 DIAGNOSIS — I5043 Acute on chronic combined systolic (congestive) and diastolic (congestive) heart failure: Secondary | ICD-10-CM | POA: Diagnosis not present

## 2020-09-01 DIAGNOSIS — I2510 Atherosclerotic heart disease of native coronary artery without angina pectoris: Secondary | ICD-10-CM | POA: Diagnosis not present

## 2020-09-01 DIAGNOSIS — I872 Venous insufficiency (chronic) (peripheral): Secondary | ICD-10-CM | POA: Diagnosis not present

## 2020-09-01 DIAGNOSIS — I739 Peripheral vascular disease, unspecified: Secondary | ICD-10-CM | POA: Diagnosis not present

## 2020-09-01 DIAGNOSIS — I4821 Permanent atrial fibrillation: Secondary | ICD-10-CM | POA: Diagnosis not present

## 2020-09-01 DIAGNOSIS — C44319 Basal cell carcinoma of skin of other parts of face: Secondary | ICD-10-CM | POA: Diagnosis not present

## 2020-09-01 NOTE — Telephone Encounter (Signed)
[facility] called back and stated that they are managing pt's warfarin/INR checks now. Will close pt's anti coag encounter for heartcare.   Called and spoke to pt's daughter, Tyler Williams because pt's son [name] recommended to call her because she is more active in their father's care. Made her aware that [facility] stated that they were managing pt's warfrin/INR now but to call us if we could ever be of service.

## 2020-09-01 NOTE — Telephone Encounter (Signed)
Called and spoke to pt's son who stated that he was unsure if [facility] is managing his warfarin because he thought they were suppose to come out earlier this week to check.   Called [facility] and [facility] to call coumadin clinic back. Need to make sure they are managing pt's warfarin.

## 2020-09-06 DIAGNOSIS — I110 Hypertensive heart disease with heart failure: Secondary | ICD-10-CM | POA: Diagnosis not present

## 2020-09-06 DIAGNOSIS — I5043 Acute on chronic combined systolic (congestive) and diastolic (congestive) heart failure: Secondary | ICD-10-CM | POA: Diagnosis not present

## 2020-09-06 DIAGNOSIS — I739 Peripheral vascular disease, unspecified: Secondary | ICD-10-CM | POA: Diagnosis not present

## 2020-09-06 DIAGNOSIS — I4821 Permanent atrial fibrillation: Secondary | ICD-10-CM | POA: Diagnosis not present

## 2020-09-06 DIAGNOSIS — I872 Venous insufficiency (chronic) (peripheral): Secondary | ICD-10-CM | POA: Diagnosis not present

## 2020-09-06 DIAGNOSIS — I2510 Atherosclerotic heart disease of native coronary artery without angina pectoris: Secondary | ICD-10-CM | POA: Diagnosis not present

## 2020-09-07 DIAGNOSIS — I4821 Permanent atrial fibrillation: Secondary | ICD-10-CM | POA: Diagnosis not present

## 2020-09-07 DIAGNOSIS — I739 Peripheral vascular disease, unspecified: Secondary | ICD-10-CM | POA: Diagnosis not present

## 2020-09-07 DIAGNOSIS — I872 Venous insufficiency (chronic) (peripheral): Secondary | ICD-10-CM | POA: Diagnosis not present

## 2020-09-07 DIAGNOSIS — I5043 Acute on chronic combined systolic (congestive) and diastolic (congestive) heart failure: Secondary | ICD-10-CM | POA: Diagnosis not present

## 2020-09-07 DIAGNOSIS — I2510 Atherosclerotic heart disease of native coronary artery without angina pectoris: Secondary | ICD-10-CM | POA: Diagnosis not present

## 2020-09-07 DIAGNOSIS — I110 Hypertensive heart disease with heart failure: Secondary | ICD-10-CM | POA: Diagnosis not present

## 2020-09-09 DIAGNOSIS — M21332 Wrist drop, left wrist: Secondary | ICD-10-CM | POA: Diagnosis not present

## 2020-09-12 DIAGNOSIS — I130 Hypertensive heart and chronic kidney disease with heart failure and stage 1 through stage 4 chronic kidney disease, or unspecified chronic kidney disease: Secondary | ICD-10-CM | POA: Diagnosis not present

## 2020-09-12 DIAGNOSIS — N182 Chronic kidney disease, stage 2 (mild): Secondary | ICD-10-CM | POA: Diagnosis not present

## 2020-09-12 DIAGNOSIS — E785 Hyperlipidemia, unspecified: Secondary | ICD-10-CM | POA: Diagnosis not present

## 2020-09-12 DIAGNOSIS — I509 Heart failure, unspecified: Secondary | ICD-10-CM | POA: Diagnosis not present

## 2020-09-13 DIAGNOSIS — Z1159 Encounter for screening for other viral diseases: Secondary | ICD-10-CM | POA: Diagnosis not present

## 2020-09-13 DIAGNOSIS — R262 Difficulty in walking, not elsewhere classified: Secondary | ICD-10-CM | POA: Diagnosis not present

## 2020-09-13 DIAGNOSIS — M21332 Wrist drop, left wrist: Secondary | ICD-10-CM | POA: Diagnosis not present

## 2020-09-13 DIAGNOSIS — M6281 Muscle weakness (generalized): Secondary | ICD-10-CM | POA: Diagnosis not present

## 2020-09-13 DIAGNOSIS — R2681 Unsteadiness on feet: Secondary | ICD-10-CM | POA: Diagnosis not present

## 2020-09-13 DIAGNOSIS — Z20828 Contact with and (suspected) exposure to other viral communicable diseases: Secondary | ICD-10-CM | POA: Diagnosis not present

## 2020-09-14 DIAGNOSIS — R262 Difficulty in walking, not elsewhere classified: Secondary | ICD-10-CM | POA: Diagnosis not present

## 2020-09-14 DIAGNOSIS — R2681 Unsteadiness on feet: Secondary | ICD-10-CM | POA: Diagnosis not present

## 2020-09-14 DIAGNOSIS — M21332 Wrist drop, left wrist: Secondary | ICD-10-CM | POA: Diagnosis not present

## 2020-09-14 DIAGNOSIS — M6281 Muscle weakness (generalized): Secondary | ICD-10-CM | POA: Diagnosis not present

## 2020-09-15 DIAGNOSIS — R262 Difficulty in walking, not elsewhere classified: Secondary | ICD-10-CM | POA: Diagnosis not present

## 2020-09-15 DIAGNOSIS — R2681 Unsteadiness on feet: Secondary | ICD-10-CM | POA: Diagnosis not present

## 2020-09-15 DIAGNOSIS — M21332 Wrist drop, left wrist: Secondary | ICD-10-CM | POA: Diagnosis not present

## 2020-09-15 DIAGNOSIS — M6281 Muscle weakness (generalized): Secondary | ICD-10-CM | POA: Diagnosis not present

## 2020-09-16 DIAGNOSIS — Z20828 Contact with and (suspected) exposure to other viral communicable diseases: Secondary | ICD-10-CM | POA: Diagnosis not present

## 2020-09-16 DIAGNOSIS — Z1159 Encounter for screening for other viral diseases: Secondary | ICD-10-CM | POA: Diagnosis not present

## 2020-09-17 DIAGNOSIS — M6281 Muscle weakness (generalized): Secondary | ICD-10-CM | POA: Diagnosis not present

## 2020-09-17 DIAGNOSIS — M21332 Wrist drop, left wrist: Secondary | ICD-10-CM | POA: Diagnosis not present

## 2020-09-17 DIAGNOSIS — R262 Difficulty in walking, not elsewhere classified: Secondary | ICD-10-CM | POA: Diagnosis not present

## 2020-09-17 DIAGNOSIS — R2681 Unsteadiness on feet: Secondary | ICD-10-CM | POA: Diagnosis not present

## 2020-09-20 ENCOUNTER — Other Ambulatory Visit: Payer: Self-pay | Admitting: Physician Assistant

## 2020-09-20 DIAGNOSIS — M6281 Muscle weakness (generalized): Secondary | ICD-10-CM | POA: Diagnosis not present

## 2020-09-20 DIAGNOSIS — Z125 Encounter for screening for malignant neoplasm of prostate: Secondary | ICD-10-CM | POA: Diagnosis not present

## 2020-09-20 DIAGNOSIS — M21332 Wrist drop, left wrist: Secondary | ICD-10-CM | POA: Diagnosis not present

## 2020-09-20 DIAGNOSIS — R262 Difficulty in walking, not elsewhere classified: Secondary | ICD-10-CM | POA: Diagnosis not present

## 2020-09-20 DIAGNOSIS — E785 Hyperlipidemia, unspecified: Secondary | ICD-10-CM | POA: Diagnosis not present

## 2020-09-20 DIAGNOSIS — R2681 Unsteadiness on feet: Secondary | ICD-10-CM | POA: Diagnosis not present

## 2020-09-21 DIAGNOSIS — M21332 Wrist drop, left wrist: Secondary | ICD-10-CM | POA: Diagnosis not present

## 2020-09-21 DIAGNOSIS — R262 Difficulty in walking, not elsewhere classified: Secondary | ICD-10-CM | POA: Diagnosis not present

## 2020-09-21 DIAGNOSIS — R2681 Unsteadiness on feet: Secondary | ICD-10-CM | POA: Diagnosis not present

## 2020-09-21 DIAGNOSIS — M6281 Muscle weakness (generalized): Secondary | ICD-10-CM | POA: Diagnosis not present

## 2020-09-22 DIAGNOSIS — R262 Difficulty in walking, not elsewhere classified: Secondary | ICD-10-CM | POA: Diagnosis not present

## 2020-09-22 DIAGNOSIS — R2681 Unsteadiness on feet: Secondary | ICD-10-CM | POA: Diagnosis not present

## 2020-09-22 DIAGNOSIS — M21332 Wrist drop, left wrist: Secondary | ICD-10-CM | POA: Diagnosis not present

## 2020-09-22 DIAGNOSIS — M6281 Muscle weakness (generalized): Secondary | ICD-10-CM | POA: Diagnosis not present

## 2020-09-23 DIAGNOSIS — R262 Difficulty in walking, not elsewhere classified: Secondary | ICD-10-CM | POA: Diagnosis not present

## 2020-09-23 DIAGNOSIS — R2681 Unsteadiness on feet: Secondary | ICD-10-CM | POA: Diagnosis not present

## 2020-09-23 DIAGNOSIS — M6281 Muscle weakness (generalized): Secondary | ICD-10-CM | POA: Diagnosis not present

## 2020-09-23 DIAGNOSIS — M21332 Wrist drop, left wrist: Secondary | ICD-10-CM | POA: Diagnosis not present

## 2020-09-24 DIAGNOSIS — M21332 Wrist drop, left wrist: Secondary | ICD-10-CM | POA: Diagnosis not present

## 2020-09-24 DIAGNOSIS — M6281 Muscle weakness (generalized): Secondary | ICD-10-CM | POA: Diagnosis not present

## 2020-09-24 DIAGNOSIS — R262 Difficulty in walking, not elsewhere classified: Secondary | ICD-10-CM | POA: Diagnosis not present

## 2020-09-24 DIAGNOSIS — R2681 Unsteadiness on feet: Secondary | ICD-10-CM | POA: Diagnosis not present

## 2020-09-27 DIAGNOSIS — Z952 Presence of prosthetic heart valve: Secondary | ICD-10-CM | POA: Diagnosis not present

## 2020-09-27 DIAGNOSIS — Z0000 Encounter for general adult medical examination without abnormal findings: Secondary | ICD-10-CM | POA: Diagnosis not present

## 2020-09-27 DIAGNOSIS — I2510 Atherosclerotic heart disease of native coronary artery without angina pectoris: Secondary | ICD-10-CM | POA: Diagnosis not present

## 2020-09-27 DIAGNOSIS — I130 Hypertensive heart and chronic kidney disease with heart failure and stage 1 through stage 4 chronic kidney disease, or unspecified chronic kidney disease: Secondary | ICD-10-CM | POA: Diagnosis not present

## 2020-09-27 DIAGNOSIS — I509 Heart failure, unspecified: Secondary | ICD-10-CM | POA: Diagnosis not present

## 2020-09-27 DIAGNOSIS — R82998 Other abnormal findings in urine: Secondary | ICD-10-CM | POA: Diagnosis not present

## 2020-09-27 DIAGNOSIS — Z8616 Personal history of COVID-19: Secondary | ICD-10-CM | POA: Diagnosis not present

## 2020-09-27 DIAGNOSIS — Z1339 Encounter for screening examination for other mental health and behavioral disorders: Secondary | ICD-10-CM | POA: Diagnosis not present

## 2020-09-27 DIAGNOSIS — Z7901 Long term (current) use of anticoagulants: Secondary | ICD-10-CM | POA: Diagnosis not present

## 2020-09-27 DIAGNOSIS — R262 Difficulty in walking, not elsewhere classified: Secondary | ICD-10-CM | POA: Diagnosis not present

## 2020-09-27 DIAGNOSIS — M6281 Muscle weakness (generalized): Secondary | ICD-10-CM | POA: Diagnosis not present

## 2020-09-27 DIAGNOSIS — I10 Essential (primary) hypertension: Secondary | ICD-10-CM | POA: Diagnosis not present

## 2020-09-27 DIAGNOSIS — R2681 Unsteadiness on feet: Secondary | ICD-10-CM | POA: Diagnosis not present

## 2020-09-27 DIAGNOSIS — M21332 Wrist drop, left wrist: Secondary | ICD-10-CM | POA: Diagnosis not present

## 2020-09-27 DIAGNOSIS — Z1331 Encounter for screening for depression: Secondary | ICD-10-CM | POA: Diagnosis not present

## 2020-09-27 DIAGNOSIS — I482 Chronic atrial fibrillation, unspecified: Secondary | ICD-10-CM | POA: Diagnosis not present

## 2020-09-27 DIAGNOSIS — N1831 Chronic kidney disease, stage 3a: Secondary | ICD-10-CM | POA: Diagnosis not present

## 2020-09-28 DIAGNOSIS — R2681 Unsteadiness on feet: Secondary | ICD-10-CM | POA: Diagnosis not present

## 2020-09-28 DIAGNOSIS — R262 Difficulty in walking, not elsewhere classified: Secondary | ICD-10-CM | POA: Diagnosis not present

## 2020-09-28 DIAGNOSIS — M6281 Muscle weakness (generalized): Secondary | ICD-10-CM | POA: Diagnosis not present

## 2020-09-28 DIAGNOSIS — M21332 Wrist drop, left wrist: Secondary | ICD-10-CM | POA: Diagnosis not present

## 2020-09-29 DIAGNOSIS — M6281 Muscle weakness (generalized): Secondary | ICD-10-CM | POA: Diagnosis not present

## 2020-09-29 DIAGNOSIS — R262 Difficulty in walking, not elsewhere classified: Secondary | ICD-10-CM | POA: Diagnosis not present

## 2020-09-29 DIAGNOSIS — R2681 Unsteadiness on feet: Secondary | ICD-10-CM | POA: Diagnosis not present

## 2020-09-29 DIAGNOSIS — M21332 Wrist drop, left wrist: Secondary | ICD-10-CM | POA: Diagnosis not present

## 2020-09-30 DIAGNOSIS — H353231 Exudative age-related macular degeneration, bilateral, with active choroidal neovascularization: Secondary | ICD-10-CM | POA: Diagnosis not present

## 2020-10-01 DIAGNOSIS — M6281 Muscle weakness (generalized): Secondary | ICD-10-CM | POA: Diagnosis not present

## 2020-10-01 DIAGNOSIS — R2681 Unsteadiness on feet: Secondary | ICD-10-CM | POA: Diagnosis not present

## 2020-10-01 DIAGNOSIS — R262 Difficulty in walking, not elsewhere classified: Secondary | ICD-10-CM | POA: Diagnosis not present

## 2020-10-01 DIAGNOSIS — M21332 Wrist drop, left wrist: Secondary | ICD-10-CM | POA: Diagnosis not present

## 2020-10-04 DIAGNOSIS — R2681 Unsteadiness on feet: Secondary | ICD-10-CM | POA: Diagnosis not present

## 2020-10-04 DIAGNOSIS — Z20828 Contact with and (suspected) exposure to other viral communicable diseases: Secondary | ICD-10-CM | POA: Diagnosis not present

## 2020-10-04 DIAGNOSIS — M21332 Wrist drop, left wrist: Secondary | ICD-10-CM | POA: Diagnosis not present

## 2020-10-04 DIAGNOSIS — R262 Difficulty in walking, not elsewhere classified: Secondary | ICD-10-CM | POA: Diagnosis not present

## 2020-10-04 DIAGNOSIS — M6281 Muscle weakness (generalized): Secondary | ICD-10-CM | POA: Diagnosis not present

## 2020-10-05 DIAGNOSIS — R262 Difficulty in walking, not elsewhere classified: Secondary | ICD-10-CM | POA: Diagnosis not present

## 2020-10-05 DIAGNOSIS — M6281 Muscle weakness (generalized): Secondary | ICD-10-CM | POA: Diagnosis not present

## 2020-10-05 DIAGNOSIS — R2681 Unsteadiness on feet: Secondary | ICD-10-CM | POA: Diagnosis not present

## 2020-10-05 DIAGNOSIS — M21332 Wrist drop, left wrist: Secondary | ICD-10-CM | POA: Diagnosis not present

## 2020-10-06 DIAGNOSIS — R2681 Unsteadiness on feet: Secondary | ICD-10-CM | POA: Diagnosis not present

## 2020-10-06 DIAGNOSIS — R262 Difficulty in walking, not elsewhere classified: Secondary | ICD-10-CM | POA: Diagnosis not present

## 2020-10-06 DIAGNOSIS — M21332 Wrist drop, left wrist: Secondary | ICD-10-CM | POA: Diagnosis not present

## 2020-10-06 DIAGNOSIS — M6281 Muscle weakness (generalized): Secondary | ICD-10-CM | POA: Diagnosis not present

## 2020-10-07 DIAGNOSIS — M6281 Muscle weakness (generalized): Secondary | ICD-10-CM | POA: Diagnosis not present

## 2020-10-07 DIAGNOSIS — R262 Difficulty in walking, not elsewhere classified: Secondary | ICD-10-CM | POA: Diagnosis not present

## 2020-10-07 DIAGNOSIS — M21332 Wrist drop, left wrist: Secondary | ICD-10-CM | POA: Diagnosis not present

## 2020-10-07 DIAGNOSIS — R2681 Unsteadiness on feet: Secondary | ICD-10-CM | POA: Diagnosis not present

## 2020-10-08 ENCOUNTER — Other Ambulatory Visit (HOSPITAL_COMMUNITY): Payer: Self-pay

## 2020-10-08 DIAGNOSIS — R262 Difficulty in walking, not elsewhere classified: Secondary | ICD-10-CM | POA: Diagnosis not present

## 2020-10-08 DIAGNOSIS — M6281 Muscle weakness (generalized): Secondary | ICD-10-CM | POA: Diagnosis not present

## 2020-10-08 DIAGNOSIS — R2681 Unsteadiness on feet: Secondary | ICD-10-CM | POA: Diagnosis not present

## 2020-10-08 DIAGNOSIS — M21332 Wrist drop, left wrist: Secondary | ICD-10-CM | POA: Diagnosis not present

## 2020-10-11 DIAGNOSIS — R262 Difficulty in walking, not elsewhere classified: Secondary | ICD-10-CM | POA: Diagnosis not present

## 2020-10-11 DIAGNOSIS — R2681 Unsteadiness on feet: Secondary | ICD-10-CM | POA: Diagnosis not present

## 2020-10-11 DIAGNOSIS — M21332 Wrist drop, left wrist: Secondary | ICD-10-CM | POA: Diagnosis not present

## 2020-10-11 DIAGNOSIS — Z8616 Personal history of COVID-19: Secondary | ICD-10-CM | POA: Diagnosis not present

## 2020-10-11 DIAGNOSIS — M6281 Muscle weakness (generalized): Secondary | ICD-10-CM | POA: Diagnosis not present

## 2020-10-12 DIAGNOSIS — R2681 Unsteadiness on feet: Secondary | ICD-10-CM | POA: Diagnosis not present

## 2020-10-12 DIAGNOSIS — M21332 Wrist drop, left wrist: Secondary | ICD-10-CM | POA: Diagnosis not present

## 2020-10-12 DIAGNOSIS — M6281 Muscle weakness (generalized): Secondary | ICD-10-CM | POA: Diagnosis not present

## 2020-10-12 DIAGNOSIS — R262 Difficulty in walking, not elsewhere classified: Secondary | ICD-10-CM | POA: Diagnosis not present

## 2020-10-13 ENCOUNTER — Encounter: Payer: Self-pay | Admitting: Podiatry

## 2020-10-13 ENCOUNTER — Other Ambulatory Visit: Payer: Self-pay

## 2020-10-13 ENCOUNTER — Ambulatory Visit (INDEPENDENT_AMBULATORY_CARE_PROVIDER_SITE_OTHER): Payer: Medicare Other | Admitting: Podiatry

## 2020-10-13 DIAGNOSIS — M79674 Pain in right toe(s): Secondary | ICD-10-CM | POA: Diagnosis not present

## 2020-10-13 DIAGNOSIS — I130 Hypertensive heart and chronic kidney disease with heart failure and stage 1 through stage 4 chronic kidney disease, or unspecified chronic kidney disease: Secondary | ICD-10-CM | POA: Diagnosis not present

## 2020-10-13 DIAGNOSIS — I509 Heart failure, unspecified: Secondary | ICD-10-CM | POA: Diagnosis not present

## 2020-10-13 DIAGNOSIS — B351 Tinea unguium: Secondary | ICD-10-CM

## 2020-10-13 DIAGNOSIS — N182 Chronic kidney disease, stage 2 (mild): Secondary | ICD-10-CM | POA: Diagnosis not present

## 2020-10-13 DIAGNOSIS — E785 Hyperlipidemia, unspecified: Secondary | ICD-10-CM | POA: Diagnosis not present

## 2020-10-13 DIAGNOSIS — M79675 Pain in left toe(s): Secondary | ICD-10-CM | POA: Diagnosis not present

## 2020-10-14 DIAGNOSIS — M6281 Muscle weakness (generalized): Secondary | ICD-10-CM | POA: Diagnosis not present

## 2020-10-14 DIAGNOSIS — M21332 Wrist drop, left wrist: Secondary | ICD-10-CM | POA: Diagnosis not present

## 2020-10-14 DIAGNOSIS — R262 Difficulty in walking, not elsewhere classified: Secondary | ICD-10-CM | POA: Diagnosis not present

## 2020-10-14 DIAGNOSIS — R2681 Unsteadiness on feet: Secondary | ICD-10-CM | POA: Diagnosis not present

## 2020-10-15 DIAGNOSIS — M6281 Muscle weakness (generalized): Secondary | ICD-10-CM | POA: Diagnosis not present

## 2020-10-15 DIAGNOSIS — M21332 Wrist drop, left wrist: Secondary | ICD-10-CM | POA: Diagnosis not present

## 2020-10-15 DIAGNOSIS — R2681 Unsteadiness on feet: Secondary | ICD-10-CM | POA: Diagnosis not present

## 2020-10-15 DIAGNOSIS — R262 Difficulty in walking, not elsewhere classified: Secondary | ICD-10-CM | POA: Diagnosis not present

## 2020-10-18 DIAGNOSIS — Z8616 Personal history of COVID-19: Secondary | ICD-10-CM | POA: Diagnosis not present

## 2020-10-18 NOTE — Progress Notes (Signed)
Subjective: Tyler Williams is a pleasant 87 y.o. male patient seen today for painful thick toenails that are difficult to trim. Pain interferes with ambulation. Aggravating factors include wearing enclosed shoe gear. Pain is relieved with periodic professional debridement.   He relates both 4th digit toenails are tender on today's visit. His son is present during today's visit.  PCP is Burnard Bunting, MD. Last visit was: 2-3 weeks ago for his six month check-up..  Allergies  Allergen Reactions   No Known Allergies Other (See Comments)    NKA    Objective: Physical Exam  General: Tyler Williams is a pleasant 87 y.o. Caucasian male, WD, WN in NAD. AAO x 3.   Vascular:  Capillary fill time to digits <3 seconds b/l lower extremities. Palpable DP pulse(s) b/l lower extremities Palpable PT pulse(s) b/l lower extremities Pedal hair absent. Lower extremity skin temperature gradient within normal limits. No pain with calf compression b/l.  Dermatological:  Skin warm and supple b/l lower extremities. No open wounds b/l lower extremities. No interdigital macerations b/l lower extremities. Toenails 1-5 b/l elongated, discolored, dystrophic, thickened, crumbly with subungual debris and tenderness to dorsal palpation. Incurvated nailplate lateral border(s) L 4th toe and R 4th toe.  Nail border hypertrophy minimal. There is tenderness to palpation. Sign(s) of infection: no clinical signs of infection noted on examination today..  Musculoskeletal:  Normal muscle strength 5/5 to all lower extremity muscle groups bilaterally. Adductovarus deformity L 4th toe and R 4th toe.  Neurological:  Protective sensation intact 5/5 intact bilaterally with 10g monofilament b/l.  Assessment and Plan:  1. Pain due to onychomycosis of toenails of both feet      -Examined patient. -Patient to continue soft, supportive shoe gear daily. -Toenails 1-5 b/l were debrided in length and girth with sterile  nail nippers and dremel without iatrogenic bleeding.  -Offending nail border debrided and curretaged L 4th toe and R 4th toe utilizing sterile nail nipper and currette. Border cleansed with alcohol and triple antibiotic applied. No further treatment required by patient/caregiver. -Patient to report any pedal injuries to medical professional immediately. -Patient/POA to call should there be question/concern in the interim.  Return in about 3 months (around 01/12/2021).  Marzetta Board, DPM

## 2020-10-19 DIAGNOSIS — M21332 Wrist drop, left wrist: Secondary | ICD-10-CM | POA: Diagnosis not present

## 2020-10-19 DIAGNOSIS — R262 Difficulty in walking, not elsewhere classified: Secondary | ICD-10-CM | POA: Diagnosis not present

## 2020-10-19 DIAGNOSIS — R2681 Unsteadiness on feet: Secondary | ICD-10-CM | POA: Diagnosis not present

## 2020-10-19 DIAGNOSIS — M6281 Muscle weakness (generalized): Secondary | ICD-10-CM | POA: Diagnosis not present

## 2020-10-20 DIAGNOSIS — M6281 Muscle weakness (generalized): Secondary | ICD-10-CM | POA: Diagnosis not present

## 2020-10-20 DIAGNOSIS — R2681 Unsteadiness on feet: Secondary | ICD-10-CM | POA: Diagnosis not present

## 2020-10-20 DIAGNOSIS — M21332 Wrist drop, left wrist: Secondary | ICD-10-CM | POA: Diagnosis not present

## 2020-10-20 DIAGNOSIS — R262 Difficulty in walking, not elsewhere classified: Secondary | ICD-10-CM | POA: Diagnosis not present

## 2020-10-21 DIAGNOSIS — M6281 Muscle weakness (generalized): Secondary | ICD-10-CM | POA: Diagnosis not present

## 2020-10-21 DIAGNOSIS — R2681 Unsteadiness on feet: Secondary | ICD-10-CM | POA: Diagnosis not present

## 2020-10-21 DIAGNOSIS — R262 Difficulty in walking, not elsewhere classified: Secondary | ICD-10-CM | POA: Diagnosis not present

## 2020-10-21 DIAGNOSIS — M21332 Wrist drop, left wrist: Secondary | ICD-10-CM | POA: Diagnosis not present

## 2020-10-25 DIAGNOSIS — Z8616 Personal history of COVID-19: Secondary | ICD-10-CM | POA: Diagnosis not present

## 2020-10-26 ENCOUNTER — Ambulatory Visit: Payer: Medicare Other | Admitting: Physician Assistant

## 2020-10-26 ENCOUNTER — Telehealth: Payer: Self-pay | Admitting: *Deleted

## 2020-10-26 DIAGNOSIS — M21332 Wrist drop, left wrist: Secondary | ICD-10-CM | POA: Diagnosis not present

## 2020-10-26 DIAGNOSIS — R2681 Unsteadiness on feet: Secondary | ICD-10-CM | POA: Diagnosis not present

## 2020-10-26 DIAGNOSIS — R262 Difficulty in walking, not elsewhere classified: Secondary | ICD-10-CM | POA: Diagnosis not present

## 2020-10-26 DIAGNOSIS — M6281 Muscle weakness (generalized): Secondary | ICD-10-CM | POA: Diagnosis not present

## 2020-10-26 NOTE — Telephone Encounter (Signed)
Lvm on cell phone to R/S appt with [name], PA today due to [facility] being sick. Home number did not have a vm.  R/S appt for Wednesday, October 12 @ 2:45 pm.  Will also send pt [employer].

## 2020-10-26 NOTE — Telephone Encounter (Signed)
Lvm to make sure pt knows appt was canceled today and rescheduled.

## 2020-10-27 DIAGNOSIS — M21332 Wrist drop, left wrist: Secondary | ICD-10-CM | POA: Diagnosis not present

## 2020-10-27 DIAGNOSIS — R262 Difficulty in walking, not elsewhere classified: Secondary | ICD-10-CM | POA: Diagnosis not present

## 2020-10-27 DIAGNOSIS — R2681 Unsteadiness on feet: Secondary | ICD-10-CM | POA: Diagnosis not present

## 2020-10-27 DIAGNOSIS — N3941 Urge incontinence: Secondary | ICD-10-CM | POA: Diagnosis not present

## 2020-10-27 DIAGNOSIS — N401 Enlarged prostate with lower urinary tract symptoms: Secondary | ICD-10-CM | POA: Diagnosis not present

## 2020-10-27 DIAGNOSIS — M6281 Muscle weakness (generalized): Secondary | ICD-10-CM | POA: Diagnosis not present

## 2020-10-27 DIAGNOSIS — N3281 Overactive bladder: Secondary | ICD-10-CM | POA: Diagnosis not present

## 2020-10-27 DIAGNOSIS — R338 Other retention of urine: Secondary | ICD-10-CM | POA: Diagnosis not present

## 2020-10-28 DIAGNOSIS — H9193 Unspecified hearing loss, bilateral: Secondary | ICD-10-CM | POA: Diagnosis not present

## 2020-10-28 DIAGNOSIS — M6281 Muscle weakness (generalized): Secondary | ICD-10-CM | POA: Diagnosis not present

## 2020-10-28 DIAGNOSIS — S0091XA Abrasion of unspecified part of head, initial encounter: Secondary | ICD-10-CM | POA: Diagnosis not present

## 2020-10-28 DIAGNOSIS — R2681 Unsteadiness on feet: Secondary | ICD-10-CM | POA: Diagnosis not present

## 2020-10-28 DIAGNOSIS — M21332 Wrist drop, left wrist: Secondary | ICD-10-CM | POA: Diagnosis not present

## 2020-10-28 DIAGNOSIS — H6121 Impacted cerumen, right ear: Secondary | ICD-10-CM | POA: Diagnosis not present

## 2020-10-28 DIAGNOSIS — H6122 Impacted cerumen, left ear: Secondary | ICD-10-CM | POA: Diagnosis not present

## 2020-10-28 DIAGNOSIS — R262 Difficulty in walking, not elsewhere classified: Secondary | ICD-10-CM | POA: Diagnosis not present

## 2020-10-29 DIAGNOSIS — R2681 Unsteadiness on feet: Secondary | ICD-10-CM | POA: Diagnosis not present

## 2020-10-29 DIAGNOSIS — R262 Difficulty in walking, not elsewhere classified: Secondary | ICD-10-CM | POA: Diagnosis not present

## 2020-10-29 DIAGNOSIS — M6281 Muscle weakness (generalized): Secondary | ICD-10-CM | POA: Diagnosis not present

## 2020-10-29 DIAGNOSIS — M21332 Wrist drop, left wrist: Secondary | ICD-10-CM | POA: Diagnosis not present

## 2020-11-01 DIAGNOSIS — R2681 Unsteadiness on feet: Secondary | ICD-10-CM | POA: Diagnosis not present

## 2020-11-01 DIAGNOSIS — R262 Difficulty in walking, not elsewhere classified: Secondary | ICD-10-CM | POA: Diagnosis not present

## 2020-11-01 DIAGNOSIS — M21332 Wrist drop, left wrist: Secondary | ICD-10-CM | POA: Diagnosis not present

## 2020-11-01 DIAGNOSIS — M6281 Muscle weakness (generalized): Secondary | ICD-10-CM | POA: Diagnosis not present

## 2020-11-02 ENCOUNTER — Ambulatory Visit: Payer: Medicare Other | Admitting: Podiatry

## 2020-11-02 DIAGNOSIS — R262 Difficulty in walking, not elsewhere classified: Secondary | ICD-10-CM | POA: Diagnosis not present

## 2020-11-02 DIAGNOSIS — M6281 Muscle weakness (generalized): Secondary | ICD-10-CM | POA: Diagnosis not present

## 2020-11-02 DIAGNOSIS — M21332 Wrist drop, left wrist: Secondary | ICD-10-CM | POA: Diagnosis not present

## 2020-11-02 DIAGNOSIS — R2681 Unsteadiness on feet: Secondary | ICD-10-CM | POA: Diagnosis not present

## 2020-11-03 DIAGNOSIS — R2681 Unsteadiness on feet: Secondary | ICD-10-CM | POA: Diagnosis not present

## 2020-11-03 DIAGNOSIS — M21332 Wrist drop, left wrist: Secondary | ICD-10-CM | POA: Diagnosis not present

## 2020-11-03 DIAGNOSIS — R262 Difficulty in walking, not elsewhere classified: Secondary | ICD-10-CM | POA: Diagnosis not present

## 2020-11-03 DIAGNOSIS — M6281 Muscle weakness (generalized): Secondary | ICD-10-CM | POA: Diagnosis not present

## 2020-11-08 DIAGNOSIS — R262 Difficulty in walking, not elsewhere classified: Secondary | ICD-10-CM | POA: Diagnosis not present

## 2020-11-08 DIAGNOSIS — M6281 Muscle weakness (generalized): Secondary | ICD-10-CM | POA: Diagnosis not present

## 2020-11-08 DIAGNOSIS — R2681 Unsteadiness on feet: Secondary | ICD-10-CM | POA: Diagnosis not present

## 2020-11-08 DIAGNOSIS — M21332 Wrist drop, left wrist: Secondary | ICD-10-CM | POA: Diagnosis not present

## 2020-11-10 DIAGNOSIS — R2681 Unsteadiness on feet: Secondary | ICD-10-CM | POA: Diagnosis not present

## 2020-11-10 DIAGNOSIS — M21332 Wrist drop, left wrist: Secondary | ICD-10-CM | POA: Diagnosis not present

## 2020-11-10 DIAGNOSIS — R262 Difficulty in walking, not elsewhere classified: Secondary | ICD-10-CM | POA: Diagnosis not present

## 2020-11-10 DIAGNOSIS — M6281 Muscle weakness (generalized): Secondary | ICD-10-CM | POA: Diagnosis not present

## 2020-11-11 DIAGNOSIS — R262 Difficulty in walking, not elsewhere classified: Secondary | ICD-10-CM | POA: Diagnosis not present

## 2020-11-11 DIAGNOSIS — R2681 Unsteadiness on feet: Secondary | ICD-10-CM | POA: Diagnosis not present

## 2020-11-11 DIAGNOSIS — M6281 Muscle weakness (generalized): Secondary | ICD-10-CM | POA: Diagnosis not present

## 2020-11-11 DIAGNOSIS — M21332 Wrist drop, left wrist: Secondary | ICD-10-CM | POA: Diagnosis not present

## 2020-11-12 DIAGNOSIS — M21332 Wrist drop, left wrist: Secondary | ICD-10-CM | POA: Diagnosis not present

## 2020-11-12 DIAGNOSIS — M6281 Muscle weakness (generalized): Secondary | ICD-10-CM | POA: Diagnosis not present

## 2020-11-12 DIAGNOSIS — R2681 Unsteadiness on feet: Secondary | ICD-10-CM | POA: Diagnosis not present

## 2020-11-12 DIAGNOSIS — R262 Difficulty in walking, not elsewhere classified: Secondary | ICD-10-CM | POA: Diagnosis not present

## 2020-11-16 DIAGNOSIS — R262 Difficulty in walking, not elsewhere classified: Secondary | ICD-10-CM | POA: Diagnosis not present

## 2020-11-16 DIAGNOSIS — M6281 Muscle weakness (generalized): Secondary | ICD-10-CM | POA: Diagnosis not present

## 2020-11-16 DIAGNOSIS — R2681 Unsteadiness on feet: Secondary | ICD-10-CM | POA: Diagnosis not present

## 2020-11-16 DIAGNOSIS — M21332 Wrist drop, left wrist: Secondary | ICD-10-CM | POA: Diagnosis not present

## 2020-11-17 DIAGNOSIS — G5632 Lesion of radial nerve, left upper limb: Secondary | ICD-10-CM | POA: Diagnosis not present

## 2020-11-17 DIAGNOSIS — M79642 Pain in left hand: Secondary | ICD-10-CM | POA: Diagnosis not present

## 2020-11-18 DIAGNOSIS — H35361 Drusen (degenerative) of macula, right eye: Secondary | ICD-10-CM | POA: Diagnosis not present

## 2020-11-18 DIAGNOSIS — H3589 Other specified retinal disorders: Secondary | ICD-10-CM | POA: Diagnosis not present

## 2020-11-18 DIAGNOSIS — H353231 Exudative age-related macular degeneration, bilateral, with active choroidal neovascularization: Secondary | ICD-10-CM | POA: Diagnosis not present

## 2020-11-19 DIAGNOSIS — M6281 Muscle weakness (generalized): Secondary | ICD-10-CM | POA: Diagnosis not present

## 2020-11-19 DIAGNOSIS — R262 Difficulty in walking, not elsewhere classified: Secondary | ICD-10-CM | POA: Diagnosis not present

## 2020-11-19 DIAGNOSIS — R2681 Unsteadiness on feet: Secondary | ICD-10-CM | POA: Diagnosis not present

## 2020-11-19 DIAGNOSIS — M21332 Wrist drop, left wrist: Secondary | ICD-10-CM | POA: Diagnosis not present

## 2020-11-23 DIAGNOSIS — R262 Difficulty in walking, not elsewhere classified: Secondary | ICD-10-CM | POA: Diagnosis not present

## 2020-11-23 DIAGNOSIS — M6281 Muscle weakness (generalized): Secondary | ICD-10-CM | POA: Diagnosis not present

## 2020-11-23 DIAGNOSIS — R2681 Unsteadiness on feet: Secondary | ICD-10-CM | POA: Diagnosis not present

## 2020-11-23 DIAGNOSIS — M21332 Wrist drop, left wrist: Secondary | ICD-10-CM | POA: Diagnosis not present

## 2020-11-24 ENCOUNTER — Ambulatory Visit (INDEPENDENT_AMBULATORY_CARE_PROVIDER_SITE_OTHER): Payer: Medicare Other | Admitting: Physician Assistant

## 2020-11-24 ENCOUNTER — Telehealth: Payer: Self-pay | Admitting: *Deleted

## 2020-11-24 ENCOUNTER — Other Ambulatory Visit: Payer: Self-pay

## 2020-11-24 ENCOUNTER — Encounter: Payer: Self-pay | Admitting: Physician Assistant

## 2020-11-24 VITALS — BP 118/40 | HR 66 | Ht 68.0 in | Wt 168.8 lb

## 2020-11-24 DIAGNOSIS — Z952 Presence of prosthetic heart valve: Secondary | ICD-10-CM

## 2020-11-24 DIAGNOSIS — E782 Mixed hyperlipidemia: Secondary | ICD-10-CM

## 2020-11-24 DIAGNOSIS — I4821 Permanent atrial fibrillation: Secondary | ICD-10-CM

## 2020-11-24 DIAGNOSIS — I2510 Atherosclerotic heart disease of native coronary artery without angina pectoris: Secondary | ICD-10-CM

## 2020-11-24 DIAGNOSIS — I25119 Atherosclerotic heart disease of native coronary artery with unspecified angina pectoris: Secondary | ICD-10-CM | POA: Diagnosis not present

## 2020-11-24 DIAGNOSIS — I359 Nonrheumatic aortic valve disorder, unspecified: Secondary | ICD-10-CM

## 2020-11-24 DIAGNOSIS — I5020 Unspecified systolic (congestive) heart failure: Secondary | ICD-10-CM | POA: Diagnosis not present

## 2020-11-24 DIAGNOSIS — I10 Essential (primary) hypertension: Secondary | ICD-10-CM | POA: Diagnosis not present

## 2020-11-24 NOTE — Progress Notes (Addendum)
Cardiology Office Note:    Date:  11/24/2020   ID:  Tyler Williams, DOB 04-17-1926, MRN [id number]  PCP:  Burnard Bunting, MD   [facility] Providers Cardiologist:  Sherren Mocha, MD    Referring MD: Burnard Bunting, MD   Chief Complaint:  Follow-up for CHF, atrial fibrillation, CAD    Patient Profile:   Tyler Williams is a 87 y.o. male with:  Permanent atrial fibrillation Coumadin anticoagulation Coronary artery disease S/p POBA in the 1980s, 1990s Known high-grade stenosis of mid RCA; unsuccessful attempted PCI in past Cath 7/19: Mid RCA, 85 >>unsuccessful PCI attempt to the RCA HFmrEF (heart failure with mildly reduced ejection fraction)  Echo 4/22: EF 45-50 with inferior and inferolateral HK, GRII DD Peripheral arterial disease AAA s/p repair Aortic stenosis S/p TAVR 05/979 Chronic diastolic CHF Venous insufficiency  Aortic atherosclerosis History of urinary retention  Prior CV studies: Echocardiogram 06/12/20 EF 45-50, mild concentric LVH, GRII DD, inferior and inferolateral HK, mildly reduced RVSF, severe LAE, moderate RAE, mild-moderate MR, TAVR with trivial PVL and mean gradient 6  Cardiac catheterization 08/31/2017 LM mid 30 LAD normal LCx normal RCA mid 95, 85 LVEDP 12 PCI: Unsuccessful to the RCA   Echo 08/28/2017 Mild LVH, EF 55-60, inferolateral akinesis, bioprosthetic aortic valve with trivial AI, MAC, mild MR, severe LAE, moderate RAE, PASP 43   24-hour Holter 02/01/2017 The basic rhythm is marked sinus bradycardia with average HR 48 bpm There are PVC's, but no sustained arrhythmia No atrial fibrillation noted No tachy-arrhythmias   Carotid US 12/04/2016 Bilateral ICA 1-39   History of Present Illness: Tyler Williams was admitted in April 2022 with volume overload in the setting of influenza A.  Echocardiogram demonstrated reduced LV function with EF of 45-50.  Medications were adjusted for heart failure.  He was not placed on SGLT2  inhibitor given history of urinary retention.  He did follow-up in CHF clinic for posthospital evaluation.  He was last seen by Dr. Burt Knack in 6/22.  He returns for follow-up.  He is here with his son.  He lives at [facility].  He has been doing well since last seen.  His leg edema remains improved.  He has not had significant shortness of breath.  He has not had chest pain, syncope, orthopnea.  He has not had significant dizziness or lightheadedness.    Past Medical History:  Diagnosis Date   A-fib [facility])    AAA (abdominal aortic aneurysm)    s/p repair   Aortic stenosis, mild    a. mean gradient 17 mmHg on 04/2012 TTE   Arthritis    knees   CAD (coronary artery disease)    a. s/p multiple percutaneous prior coronary artery interventions b. 05/2012: unsuccessful PCI to tight RCA->medically managed   Cancer ([place])    skin cancer removed, right hand, left leg, chest   Carotid artery disease (HCC)    0-39% bilateral ICA stenoses 11/2011   Chronic anticoagulation    Coumadin   Coronary artery disease    GERD (gastroesophageal reflux disease)    GERD (gastroesophageal reflux disease)    History of pulmonary embolism    1964 related to dislocated hip on right    History of skin cancer    Hypercholesteremia    Hypertension    Incidental cecal mass noted on CT imaging 05/24/2016   **An incidental finding of potential clinical significance has been found. 4.6 x 5.6 x 3.7 cm masslike lesion in the cecum adjacent to  the ileocecal valve may represent a large polyp or colonic neoplasm. 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   atorvastatin (LIPITOR) 20 MG tablet TAKE 1 TABLET ONCE DAILY.   Calcium-Magnesium-Zinc (CAL-MAG-ZINC PO) Take 100 mg by mouth daily.   Cholecalciferol (VITAMIN D3) 5000 units TABS Take 5,000 Units by mouth daily.   fesoterodine (TOVIAZ) 8 MG TB24 tablet Take 8 mg by mouth every evening.   finasteride (PROSCAR) 5 MG tablet Take 1 tablet (5 mg total) by mouth daily.   furosemide (LASIX) 80 MG tablet Patient takes 1/2 tablet by mouth daily (40 mg total)   isosorbide mononitrate (IMDUR) 60 MG 24 hr tablet TAKE (2) TABLETS BY MOUTH DAILY.   LORazepam (ATIVAN) 1 MG tablet Take 1 mg by mouth at bedtime.   metoprolol succinate (TOPROL-XL) 25 MG 24 hr tablet TAKE 1 TABLET EACH DAY.   Multiple Vitamins-Minerals (PRESERVISION AREDS) CAPS Take 1 tablet by mouth daily.   nitroGLYCERIN (NITROSTAT) 0.4 MG SL tablet Place 0.4 mg under the tongue every 5 (five) minutes as needed for chest pain.   sacubitril-valsartan (ENTRESTO) 24-26 MG Take 1 tablet by mouth 2 (two) times daily.   spironolactone (ALDACTONE) 25 MG tablet Take 1 tablet (25 mg total) by mouth daily.   tamsulosin (FLOMAX) 0.4 MG CAPS capsule Take 1 capsule (0.4 mg total) by mouth daily.   warfarin (COUMADIN) 5 MG tablet Take 0.5 tablets (2.5 mg total) by mouth daily.    Allergies:   No known allergies   Social History   Tobacco Use   Smoking status: Never   Smokeless tobacco: Never  Vaping Use   Vaping Use: Never used  Substance Use Topics   Alcohol use: Not Currently    Comment: patient doesn't drink anymore   Drug use: Never    Family Hx: The patient's family history includes Heart attack (age of onset: 5) in his mother.  Review of Systems   Gastrointestinal:  Negative for hematochezia.  Genitourinary:  Negative for hematuria.    EKGs/Labs/Other Test Reviewed:    EKG:  EKG is not ordered today.  The ekg ordered today demonstrates n/a  Recent Labs: 06/12/2020: ALT 25; B Natriuretic Peptide 1,215.1 06/21/2020: Hemoglobin 13.2; Magnesium 2.3; Platelets 191 07/21/2020: BUN 24; Creatinine, Ser 1.06; Potassium 4.7; Sodium 135   Recent Lipid Panel Lab Results  Component Value Date/Time   CHOL 149 08/28/2017 03:50 AM   TRIG 95 08/28/2017 03:50 AM   HDL 52 08/28/2017 03:50 AM   LDLCALC 78 08/28/2017 03:50 AM   Labs obtained through [place] - personally reviewed and interpreted: H/H/22: Total cholesterol 133, HDL 52, LDL 69, triglycerides 59, K+ 5, ALT 15, TSH 2.3   Risk Assessment/Calculations:    CHA2DS2-VASc Score = 5   This indicates a 7.2% annual risk of stroke. The patient's score  is based upon: CHF History: 1 HTN History: 1 Diabetes History: 0 Stroke History: 0 Vascular Disease History: 1 Age Score: 2 Gender Score: 0         Physical Exam:    VS:  BP (!) 118/40   Pulse 66   Ht _0  (1.727 m)   Wt 168 lb 12.8 oz (76.6 kg)   SpO2 97%   BMI 25.67 kg/m     Wt Readings from Last 3 Encounters:  11/24/20 168 lb 12.8 oz (76.6 kg)  07/21/20 164 lb 9.6 oz (74.7 kg)  06/28/20 168 lb 12.8 oz (76.6 kg)    Constitutional:      Appearance: Healthy appearance. Not in distress.  Neck:     Vascular: No JVR.  Pulmonary:     Effort: Pulmonary effort is normal.     Breath sounds: No wheezing. No rales.  Cardiovascular:     Normal rate. Regular rhythm. Normal S1. Normal S2.      Murmurs: There is a grade 1/6 systolic murmur at the URSB.  Edema:    Peripheral edema present.    Pretibial: bilateral 1+ edema of the pretibial area.    Ankle: bilateral 1+ edema of the ankle. Abdominal:     Palpations: Abdomen is soft.  Skin:    General: Skin is warm and dry.  Neurological:     General: No focal deficit present.      Mental Status: Alert and oriented to person, place and time.     Cranial Nerves: Cranial nerves are intact.       ASSESSMENT & PLAN:   1. 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) EF 45-50 by echocardiogram 4/22.  NYHA IIb.  Volume status appears stable.  Continue furosemide 40 mg daily, isosorbide 120 mg daily, metoprolol succinate 25 mg daily, Entresto 24/26 mg twice daily, spironolactone 25 mg daily.  He is not a good candidate for SGLT2 inhibitor given his history of urinary retention.  His blood pressure is on the lower side but he is asymptomatic.  I have asked him to contact us if he has any issues with significant lightheadedness or dizziness.  We could decrease his furosemide further.  However, given his lower extremity edema, I would like to avoid this if at all possible.  Follow-up in December with Dr. Burt Knack as planned.  2. Permanent atrial fibrillation (HCC) Rate is controlled.  He is tolerating anticoagulation.  Continue warfarin.  3. Coronary artery disease involving native coronary artery of native heart without angina pectoris History of multiple PCI procedures in the past.  He has known high-grade disease in the RCA and prior PCI attempt has been unsuccessful.  He is currently doing well without anginal symptoms.  Continue aspirin 81 mg daily, atorvastatin 20 mg daily, isosorbide mononitrate 120 mg daily, metoprolol succinate 25 mg daily.  4. Aortic valve disease 5. S/P TAVR (transcatheter aortic valve replacement) Normally functioning aortic valve prosthesis by echocardiogram 4/22.  Continue SBE prophylaxis.  6. Essential hypertension As noted, blood pressure running somewhat low.  However, he is tolerating this.  Have asked him to contact us if he has symptomatic hypotension.  7. Mixed hyperlipidemia LDL optimal.  Continue atorvastatin 20 mg daily.  Addendum: Labs from primary care received and reviewed.  09/20/2020: Total cholesterol 133, triglycerides 59, HDL 52,  LDL 69, Hgb 14.3, creatinine 1.2, K+ 5, ALT 15  Dispo:  Return in about 2 months (around 01/24/2021) for Follow up in 2 months with Dr. Burt Knack.Marland Kitchen  Medication Adjustments/Labs and Tests Ordered: Current medicines are reviewed at length with the patient today.  Concerns regarding medicines are outlined above.  Tests Ordered: No orders of the defined types were placed in this encounter.  Medication Changes: No orders of the defined types were placed in this encounter.  Signed, [name], PA-C  11/24/2020 3:38 PM    [facility] [place], [place], [place]  [address] Phone: [phone]; Fax: [phone]

## 2020-11-24 NOTE — Telephone Encounter (Signed)
Lvm for Tyler Williams with [facility] @ [phone] to request recent labs from 8/22 for [name], PA-C.

## 2020-11-24 NOTE — Patient Instructions (Signed)
Medication Instructions:   Your physician recommends that you continue on your current medications as directed. Please refer to the Current Medication list given to you today.  *If you need a refill on your cardiac medications before your next appointment, please call your pharmacy*   Lab Work:  -NONE  If you have labs (blood work) drawn today and your tests are completely normal, you will receive your results only by: [place] (if you have MyChart) OR A paper copy in the mail If you have any lab test that is abnormal or we need to change your treatment, we will call you to review the results.   Testing/Procedures:  -NONE  Follow-Up: At [facility], you and your health needs are our priority.  As part of our continuing mission to provide you with exceptional heart care, we have created designated Provider Care Teams.  These Care Teams include your primary Cardiologist (physician) and Advanced Practice Providers (APPs -  Physician Assistants and Nurse Practitioners) who all work together to provide you with the care you need, when you need it.  We recommend signing up for the patient portal called "MyChart".  Sign up information is provided on this After Visit Summary.  MyChart is used to connect with patients for Virtual Visits (Telemedicine).  Patients are able to view lab/test results, encounter notes, upcoming appointments, etc.  Non-urgent messages can be sent to your provider as well.   To learn more about what you can do with MyChart, go to [email or web address].    Your next appointment:   2 month(s)  The format for your next appointment:   In Person  Provider:   Sherren Mocha, MD   Other Instructions

## 2020-11-25 DIAGNOSIS — M6281 Muscle weakness (generalized): Secondary | ICD-10-CM | POA: Diagnosis not present

## 2020-11-25 DIAGNOSIS — R2681 Unsteadiness on feet: Secondary | ICD-10-CM | POA: Diagnosis not present

## 2020-11-25 DIAGNOSIS — R262 Difficulty in walking, not elsewhere classified: Secondary | ICD-10-CM | POA: Diagnosis not present

## 2020-11-25 DIAGNOSIS — M21332 Wrist drop, left wrist: Secondary | ICD-10-CM | POA: Diagnosis not present

## 2020-11-26 DIAGNOSIS — R2681 Unsteadiness on feet: Secondary | ICD-10-CM | POA: Diagnosis not present

## 2020-11-26 DIAGNOSIS — M21332 Wrist drop, left wrist: Secondary | ICD-10-CM | POA: Diagnosis not present

## 2020-11-26 DIAGNOSIS — R262 Difficulty in walking, not elsewhere classified: Secondary | ICD-10-CM | POA: Diagnosis not present

## 2020-11-26 DIAGNOSIS — M6281 Muscle weakness (generalized): Secondary | ICD-10-CM | POA: Diagnosis not present

## 2020-11-29 DIAGNOSIS — R262 Difficulty in walking, not elsewhere classified: Secondary | ICD-10-CM | POA: Diagnosis not present

## 2020-11-29 DIAGNOSIS — M21332 Wrist drop, left wrist: Secondary | ICD-10-CM | POA: Diagnosis not present

## 2020-11-29 DIAGNOSIS — R2681 Unsteadiness on feet: Secondary | ICD-10-CM | POA: Diagnosis not present

## 2020-11-29 DIAGNOSIS — M6281 Muscle weakness (generalized): Secondary | ICD-10-CM | POA: Diagnosis not present

## 2020-11-30 DIAGNOSIS — M6281 Muscle weakness (generalized): Secondary | ICD-10-CM | POA: Diagnosis not present

## 2020-11-30 DIAGNOSIS — R262 Difficulty in walking, not elsewhere classified: Secondary | ICD-10-CM | POA: Diagnosis not present

## 2020-11-30 DIAGNOSIS — M21332 Wrist drop, left wrist: Secondary | ICD-10-CM | POA: Diagnosis not present

## 2020-11-30 DIAGNOSIS — R2681 Unsteadiness on feet: Secondary | ICD-10-CM | POA: Diagnosis not present

## 2020-11-30 NOTE — Telephone Encounter (Signed)
2nd request

## 2020-12-01 DIAGNOSIS — M6281 Muscle weakness (generalized): Secondary | ICD-10-CM | POA: Diagnosis not present

## 2020-12-01 DIAGNOSIS — R262 Difficulty in walking, not elsewhere classified: Secondary | ICD-10-CM | POA: Diagnosis not present

## 2020-12-01 DIAGNOSIS — R2681 Unsteadiness on feet: Secondary | ICD-10-CM | POA: Diagnosis not present

## 2020-12-01 DIAGNOSIS — M21332 Wrist drop, left wrist: Secondary | ICD-10-CM | POA: Diagnosis not present

## 2020-12-01 NOTE — Telephone Encounter (Signed)
Scott received lab records yesterday.

## 2020-12-02 DIAGNOSIS — R262 Difficulty in walking, not elsewhere classified: Secondary | ICD-10-CM | POA: Diagnosis not present

## 2020-12-02 DIAGNOSIS — M21332 Wrist drop, left wrist: Secondary | ICD-10-CM | POA: Diagnosis not present

## 2020-12-02 DIAGNOSIS — R2681 Unsteadiness on feet: Secondary | ICD-10-CM | POA: Diagnosis not present

## 2020-12-02 DIAGNOSIS — M6281 Muscle weakness (generalized): Secondary | ICD-10-CM | POA: Diagnosis not present

## 2020-12-03 DIAGNOSIS — R262 Difficulty in walking, not elsewhere classified: Secondary | ICD-10-CM | POA: Diagnosis not present

## 2020-12-03 DIAGNOSIS — M21332 Wrist drop, left wrist: Secondary | ICD-10-CM | POA: Diagnosis not present

## 2020-12-03 DIAGNOSIS — M6281 Muscle weakness (generalized): Secondary | ICD-10-CM | POA: Diagnosis not present

## 2020-12-03 DIAGNOSIS — R2681 Unsteadiness on feet: Secondary | ICD-10-CM | POA: Diagnosis not present

## 2020-12-06 DIAGNOSIS — R262 Difficulty in walking, not elsewhere classified: Secondary | ICD-10-CM | POA: Diagnosis not present

## 2020-12-06 DIAGNOSIS — M21332 Wrist drop, left wrist: Secondary | ICD-10-CM | POA: Diagnosis not present

## 2020-12-06 DIAGNOSIS — R2681 Unsteadiness on feet: Secondary | ICD-10-CM | POA: Diagnosis not present

## 2020-12-06 DIAGNOSIS — M6281 Muscle weakness (generalized): Secondary | ICD-10-CM | POA: Diagnosis not present

## 2020-12-07 DIAGNOSIS — M21332 Wrist drop, left wrist: Secondary | ICD-10-CM | POA: Diagnosis not present

## 2020-12-07 DIAGNOSIS — R2681 Unsteadiness on feet: Secondary | ICD-10-CM | POA: Diagnosis not present

## 2020-12-07 DIAGNOSIS — M6281 Muscle weakness (generalized): Secondary | ICD-10-CM | POA: Diagnosis not present

## 2020-12-07 DIAGNOSIS — R262 Difficulty in walking, not elsewhere classified: Secondary | ICD-10-CM | POA: Diagnosis not present

## 2020-12-09 DIAGNOSIS — M6281 Muscle weakness (generalized): Secondary | ICD-10-CM | POA: Diagnosis not present

## 2020-12-09 DIAGNOSIS — M21332 Wrist drop, left wrist: Secondary | ICD-10-CM | POA: Diagnosis not present

## 2020-12-09 DIAGNOSIS — R2681 Unsteadiness on feet: Secondary | ICD-10-CM | POA: Diagnosis not present

## 2020-12-09 DIAGNOSIS — R262 Difficulty in walking, not elsewhere classified: Secondary | ICD-10-CM | POA: Diagnosis not present

## 2020-12-10 DIAGNOSIS — R2681 Unsteadiness on feet: Secondary | ICD-10-CM | POA: Diagnosis not present

## 2020-12-10 DIAGNOSIS — M6281 Muscle weakness (generalized): Secondary | ICD-10-CM | POA: Diagnosis not present

## 2020-12-10 DIAGNOSIS — M21332 Wrist drop, left wrist: Secondary | ICD-10-CM | POA: Diagnosis not present

## 2020-12-10 DIAGNOSIS — R262 Difficulty in walking, not elsewhere classified: Secondary | ICD-10-CM | POA: Diagnosis not present

## 2020-12-13 DIAGNOSIS — R262 Difficulty in walking, not elsewhere classified: Secondary | ICD-10-CM | POA: Diagnosis not present

## 2020-12-13 DIAGNOSIS — M6281 Muscle weakness (generalized): Secondary | ICD-10-CM | POA: Diagnosis not present

## 2020-12-13 DIAGNOSIS — M21332 Wrist drop, left wrist: Secondary | ICD-10-CM | POA: Diagnosis not present

## 2020-12-13 DIAGNOSIS — R2681 Unsteadiness on feet: Secondary | ICD-10-CM | POA: Diagnosis not present

## 2020-12-14 DIAGNOSIS — M6281 Muscle weakness (generalized): Secondary | ICD-10-CM | POA: Diagnosis not present

## 2020-12-14 DIAGNOSIS — R2681 Unsteadiness on feet: Secondary | ICD-10-CM | POA: Diagnosis not present

## 2020-12-14 DIAGNOSIS — R262 Difficulty in walking, not elsewhere classified: Secondary | ICD-10-CM | POA: Diagnosis not present

## 2020-12-14 DIAGNOSIS — M21332 Wrist drop, left wrist: Secondary | ICD-10-CM | POA: Diagnosis not present

## 2020-12-15 DIAGNOSIS — R262 Difficulty in walking, not elsewhere classified: Secondary | ICD-10-CM | POA: Diagnosis not present

## 2020-12-15 DIAGNOSIS — M6281 Muscle weakness (generalized): Secondary | ICD-10-CM | POA: Diagnosis not present

## 2020-12-15 DIAGNOSIS — R2681 Unsteadiness on feet: Secondary | ICD-10-CM | POA: Diagnosis not present

## 2020-12-15 DIAGNOSIS — M21332 Wrist drop, left wrist: Secondary | ICD-10-CM | POA: Diagnosis not present

## 2020-12-16 DIAGNOSIS — R2681 Unsteadiness on feet: Secondary | ICD-10-CM | POA: Diagnosis not present

## 2020-12-16 DIAGNOSIS — M21332 Wrist drop, left wrist: Secondary | ICD-10-CM | POA: Diagnosis not present

## 2020-12-16 DIAGNOSIS — R262 Difficulty in walking, not elsewhere classified: Secondary | ICD-10-CM | POA: Diagnosis not present

## 2020-12-16 DIAGNOSIS — M6281 Muscle weakness (generalized): Secondary | ICD-10-CM | POA: Diagnosis not present

## 2020-12-22 DIAGNOSIS — R262 Difficulty in walking, not elsewhere classified: Secondary | ICD-10-CM | POA: Diagnosis not present

## 2020-12-22 DIAGNOSIS — R2681 Unsteadiness on feet: Secondary | ICD-10-CM | POA: Diagnosis not present

## 2020-12-22 DIAGNOSIS — M21332 Wrist drop, left wrist: Secondary | ICD-10-CM | POA: Diagnosis not present

## 2020-12-22 DIAGNOSIS — M6281 Muscle weakness (generalized): Secondary | ICD-10-CM | POA: Diagnosis not present

## 2020-12-23 DIAGNOSIS — M6281 Muscle weakness (generalized): Secondary | ICD-10-CM | POA: Diagnosis not present

## 2020-12-23 DIAGNOSIS — R2681 Unsteadiness on feet: Secondary | ICD-10-CM | POA: Diagnosis not present

## 2020-12-23 DIAGNOSIS — R262 Difficulty in walking, not elsewhere classified: Secondary | ICD-10-CM | POA: Diagnosis not present

## 2020-12-23 DIAGNOSIS — M21332 Wrist drop, left wrist: Secondary | ICD-10-CM | POA: Diagnosis not present

## 2020-12-24 DIAGNOSIS — M6281 Muscle weakness (generalized): Secondary | ICD-10-CM | POA: Diagnosis not present

## 2020-12-24 DIAGNOSIS — R2681 Unsteadiness on feet: Secondary | ICD-10-CM | POA: Diagnosis not present

## 2020-12-24 DIAGNOSIS — R262 Difficulty in walking, not elsewhere classified: Secondary | ICD-10-CM | POA: Diagnosis not present

## 2020-12-24 DIAGNOSIS — M21332 Wrist drop, left wrist: Secondary | ICD-10-CM | POA: Diagnosis not present

## 2020-12-29 DIAGNOSIS — M21332 Wrist drop, left wrist: Secondary | ICD-10-CM | POA: Diagnosis not present

## 2020-12-29 DIAGNOSIS — R262 Difficulty in walking, not elsewhere classified: Secondary | ICD-10-CM | POA: Diagnosis not present

## 2020-12-29 DIAGNOSIS — M6281 Muscle weakness (generalized): Secondary | ICD-10-CM | POA: Diagnosis not present

## 2020-12-29 DIAGNOSIS — R2681 Unsteadiness on feet: Secondary | ICD-10-CM | POA: Diagnosis not present

## 2020-12-30 ENCOUNTER — Other Ambulatory Visit: Payer: Self-pay | Admitting: Cardiovascular Disease

## 2020-12-30 DIAGNOSIS — H353231 Exudative age-related macular degeneration, bilateral, with active choroidal neovascularization: Secondary | ICD-10-CM | POA: Diagnosis not present

## 2020-12-31 DIAGNOSIS — M21332 Wrist drop, left wrist: Secondary | ICD-10-CM | POA: Diagnosis not present

## 2020-12-31 DIAGNOSIS — R2681 Unsteadiness on feet: Secondary | ICD-10-CM | POA: Diagnosis not present

## 2020-12-31 DIAGNOSIS — R262 Difficulty in walking, not elsewhere classified: Secondary | ICD-10-CM | POA: Diagnosis not present

## 2020-12-31 DIAGNOSIS — M6281 Muscle weakness (generalized): Secondary | ICD-10-CM | POA: Diagnosis not present

## 2021-01-03 DIAGNOSIS — R262 Difficulty in walking, not elsewhere classified: Secondary | ICD-10-CM | POA: Diagnosis not present

## 2021-01-03 DIAGNOSIS — M6281 Muscle weakness (generalized): Secondary | ICD-10-CM | POA: Diagnosis not present

## 2021-01-03 DIAGNOSIS — R2681 Unsteadiness on feet: Secondary | ICD-10-CM | POA: Diagnosis not present

## 2021-01-03 DIAGNOSIS — M21332 Wrist drop, left wrist: Secondary | ICD-10-CM | POA: Diagnosis not present

## 2021-01-05 DIAGNOSIS — M21332 Wrist drop, left wrist: Secondary | ICD-10-CM | POA: Diagnosis not present

## 2021-01-05 DIAGNOSIS — R262 Difficulty in walking, not elsewhere classified: Secondary | ICD-10-CM | POA: Diagnosis not present

## 2021-01-05 DIAGNOSIS — R2681 Unsteadiness on feet: Secondary | ICD-10-CM | POA: Diagnosis not present

## 2021-01-05 DIAGNOSIS — M6281 Muscle weakness (generalized): Secondary | ICD-10-CM | POA: Diagnosis not present

## 2021-01-10 DIAGNOSIS — M6281 Muscle weakness (generalized): Secondary | ICD-10-CM | POA: Diagnosis not present

## 2021-01-10 DIAGNOSIS — R2681 Unsteadiness on feet: Secondary | ICD-10-CM | POA: Diagnosis not present

## 2021-01-10 DIAGNOSIS — R262 Difficulty in walking, not elsewhere classified: Secondary | ICD-10-CM | POA: Diagnosis not present

## 2021-01-10 DIAGNOSIS — M21332 Wrist drop, left wrist: Secondary | ICD-10-CM | POA: Diagnosis not present

## 2021-01-13 DIAGNOSIS — R2681 Unsteadiness on feet: Secondary | ICD-10-CM | POA: Diagnosis not present

## 2021-01-13 DIAGNOSIS — R262 Difficulty in walking, not elsewhere classified: Secondary | ICD-10-CM | POA: Diagnosis not present

## 2021-01-17 DIAGNOSIS — R2681 Unsteadiness on feet: Secondary | ICD-10-CM | POA: Diagnosis not present

## 2021-01-17 DIAGNOSIS — R262 Difficulty in walking, not elsewhere classified: Secondary | ICD-10-CM | POA: Diagnosis not present

## 2021-01-21 ENCOUNTER — Ambulatory Visit: Payer: Medicare Other | Admitting: Podiatry

## 2021-01-25 DIAGNOSIS — R2681 Unsteadiness on feet: Secondary | ICD-10-CM | POA: Diagnosis not present

## 2021-01-25 DIAGNOSIS — R262 Difficulty in walking, not elsewhere classified: Secondary | ICD-10-CM | POA: Diagnosis not present

## 2021-01-28 ENCOUNTER — Ambulatory Visit: Payer: Medicare Other | Admitting: Podiatry

## 2021-01-28 DIAGNOSIS — Z1159 Encounter for screening for other viral diseases: Secondary | ICD-10-CM | POA: Diagnosis not present

## 2021-01-28 DIAGNOSIS — Z20828 Contact with and (suspected) exposure to other viral communicable diseases: Secondary | ICD-10-CM | POA: Diagnosis not present

## 2021-02-11 ENCOUNTER — Encounter: Payer: Self-pay | Admitting: Cardiovascular Disease

## 2021-02-11 ENCOUNTER — Other Ambulatory Visit: Payer: Self-pay

## 2021-02-11 ENCOUNTER — Ambulatory Visit (INDEPENDENT_AMBULATORY_CARE_PROVIDER_SITE_OTHER): Payer: Medicare Other | Admitting: Cardiovascular Disease

## 2021-02-11 VITALS — BP 110/56 | HR 66 | Ht 68.0 in | Wt 167.0 lb

## 2021-02-11 DIAGNOSIS — I359 Nonrheumatic aortic valve disorder, unspecified: Secondary | ICD-10-CM | POA: Diagnosis not present

## 2021-02-11 DIAGNOSIS — I4821 Permanent atrial fibrillation: Secondary | ICD-10-CM | POA: Diagnosis not present

## 2021-02-11 DIAGNOSIS — I25119 Atherosclerotic heart disease of native coronary artery with unspecified angina pectoris: Secondary | ICD-10-CM | POA: Diagnosis not present

## 2021-02-11 DIAGNOSIS — I5022 Chronic systolic (congestive) heart failure: Secondary | ICD-10-CM

## 2021-02-11 NOTE — Progress Notes (Signed)
Cardiology Office Note:    Date:  02/11/2021   ID:  Tyler Williams, DOB 05-11-1926, MRN [id number]  PCP:  Burnard Bunting, MD   [facility] Providers Cardiologist:  Sherren Mocha, MD     Referring MD: Burnard Bunting, MD   Chief Complaint  Patient presents with   Coronary Artery Disease    History of Present Illness:    Tyler Williams is a 87 y.o. male with a hx of: Permanent atrial fibrillation Coumadin anticoagulation Coronary artery disease S/p POBA in the 1980s, 1990s Known high-grade stenosis of mid RCA; unsuccessful attempted PCI in past Cath 7/19: Mid RCA, 85 >>unsuccessful PCI attempt to the RCA HFmrEF (heart failure with mildly reduced ejection fraction)  Echo 4/22: EF 45-50 with inferior and inferolateral HK, GRII DD Peripheral arterial disease AAA s/p repair Aortic stenosis S/p TAVR 08/1694 Chronic diastolic CHF Venous insufficiency  Aortic atherosclerosis History of urinary retention  The patient is here with his daughter today.  He has fully transitioned to habits would.  He actually is doing quite well and he specifically denies chest pain, chest pressure, shortness of breath, or heart palpitations.  He has mild lightheadedness with postural changes but this is nothing new for him.  He is compliant with his medications.  He is trying to stay as active as possible.  Past Medical History:  Diagnosis Date   A-fib [facility])    AAA (abdominal aortic aneurysm)    s/p repair   Aortic stenosis, mild    a. mean gradient 17 mmHg on 04/2012 TTE   Arthritis    knees   CAD (coronary artery disease)    a. s/p multiple percutaneous prior coronary artery interventions b. 05/2012: unsuccessful PCI to tight RCA->medically managed   Cancer ([place])    skin cancer removed, right hand, left leg, chest   Carotid artery disease (HCC)    0-39% bilateral ICA stenoses 11/2011   Chronic anticoagulation    Coumadin   Coronary artery disease    GERD  (gastroesophageal reflux disease)    GERD (gastroesophageal reflux disease)    History of pulmonary embolism    1964 related to dislocated hip on right    History of skin cancer    Hypercholesteremia    Hypertension    Incidental cecal mass noted on CT imaging 05/24/2016   **An incidental finding of potential clinical significance has been found. 4.6 x 5.6 x 3.7 cm masslike lesion in the cecum adjacent to the ileocecal valve may represent a large polyp or colonic neoplasm. 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 Unable to pass wire past RCA lesion->medically  managed   CHOLECYSTECTOMY N/A 12/05/2012   Procedure: LAPAROSCOPIC CHOLECYSTECTOMY ;  Surgeon: Edward Jolly, MD;  Location: [place];  Service: General;  Laterality: N/A;   [place] N/A 08/31/2017   Procedure: CORONARY BALLOON ANGIOPLASTY;  Surgeon: Martinique, Peter M, MD;  Location: [place] CV LAB;  Service: Cardiovascular;  Laterality: N/A;   EYE SURGERY     hx of cataract surgery    [place]     right  inguinal hernia repair    JOINT REPLACEMENT     left knee replacement, partial right   LEFT HEART CATH AND CORONARY ANGIOGRAPHY N/A 08/31/2017   Procedure: LEFT HEART CATH AND CORONARY ANGIOGRAPHY;  Surgeon: Martinique, Peter M, MD;  Location: [place] CV LAB;  Service: Cardiovascular;  Laterality: N/A;   LEFT HEART CATHETERIZATION WITH CORONARY ANGIOGRAM N/A 05/24/2012   Procedure: LEFT HEART CATHETERIZATION WITH CORONARY ANGIOGRAM;  Surgeon: Burnell Blanks, MD;  Location: [facility] CATH LAB;  Service: Cardiovascular;  Laterality: N/A;   ORTHOPEDIC SURGERY     OTHER SURGICAL HISTORY     right knee popliteal aneurysm surgery stent placed    OTHER SURGICAL HISTORY     PARTIAL KNEE ARTHROPLASTY  01/22/2012   Procedure: UNICOMPARTMENTAL KNEE;  Surgeon: Mauri Pole, MD;  Location: [facility];  Service: Orthopedics;  Laterality: Right;   PERCUTANEOUS CORONARY INTERVENTION-BALLOON ONLY  05/24/2012   Procedure: PERCUTANEOUS CORONARY INTERVENTION-BALLOON ONLY;  Surgeon: Burnell Blanks, MD;  Location: [facility];  Service: Cardiovascular;;   RIGHT/LEFT HEART CATH AND CORONARY ANGIOGRAPHY N/A 05/12/2016   Procedure: Right/Left Heart Cath and Coronary Angiography;  Surgeon: Sherren Mocha, MD;  Location: [place] CV LAB;  Service: Cardiovascular;  Laterality: N/A;   TEE WITHOUT CARDIOVERSION N/A 06/13/2016   Procedure: TRANSESOPHAGEAL ECHOCARDIOGRAM (TEE);  Surgeon: Sherren Mocha, MD;  Location: [place];  Service: Open Heart Surgery;  Laterality: N/A;   TONSILLECTOMY     TOTAL KNEE ARTHROPLASTY Left    TRANSCATHETER AORTIC VALVE REPLACEMENT, TRANSFEMORAL N/A 06/13/2016   Procedure: TRANSCATHETER AORTIC VALVE REPLACEMENT, TRANSFEMORAL;  Surgeon: Sherren Mocha, MD;  Location: [place];  Service: Open Heart Surgery;  Laterality: N/A;    Current Medications: Current Meds  Medication Sig   aspirin EC 81 MG tablet Take 81 mg by mouth daily.   atorvastatin (LIPITOR) 20 MG tablet TAKE 1 TABLET ONCE DAILY.    Calcium-Magnesium-Zinc (CAL-MAG-ZINC PO) Take 100 mg by mouth daily.   Cholecalciferol (VITAMIN D3) 5000 units TABS Take 5,000 Units by mouth daily.   fesoterodine (TOVIAZ) 8 MG TB24 tablet Take 8 mg by mouth every evening.   furosemide (LASIX) 80 MG tablet Patient takes 1/2 tablet by mouth daily (40 mg total)   isosorbide mononitrate (IMDUR) 60 MG 24 hr tablet TAKE (2) TABLETS BY MOUTH DAILY.   LORazepam (ATIVAN) 1 MG tablet Take 1 mg by mouth at bedtime.   metoprolol succinate (TOPROL-XL) 25 MG 24 hr tablet TAKE 1 TABLET EACH DAY.   Multiple Vitamins-Minerals (PRESERVISION AREDS) CAPS Take 1 tablet by mouth daily.   nitroGLYCERIN (NITROSTAT) 0.4 MG SL tablet Place 0.4 mg under the tongue every 5 (five) minutes as needed for chest pain.   sacubitril-valsartan (ENTRESTO) 24-26 MG Take 1 tablet by mouth 2 (two) times daily.   tamsulosin (FLOMAX) 0.4 MG CAPS capsule Take 1 capsule (0.4 mg total) by mouth daily.  warfarin (COUMADIN) 5 MG tablet Take 0.5 tablets (2.5 mg total) by mouth daily.     Allergies:   No known allergies   Social History   Socioeconomic History   Marital status: Widowed    Spouse name: Not on file   Number of children: Not on file   Years of education: Not on file   Highest education level: Not on file  Occupational History   Not on file  Tobacco Use   Smoking status: Never   Smokeless tobacco: Never  Vaping Use   Vaping Use: Never used  Substance and Sexual Activity   Alcohol use: Not Currently    Comment: patient doesn't drink anymore   Drug use: Never   Sexual activity: Not on file  Other Topics Concern   Not on file  Social History Narrative   ** Merged History Encounter **       [place]; played football; score touchdown against [employer]   Social Determinants of Health   Financial Resource Strain: Not on file  Food Insecurity: Not on file  Transportation Needs: Not on file  Physical Activity: Not on file  Stress: Not on file   Social Connections: Not on file     Family History: The patient's family history includes Heart attack (age of onset: 69) in his mother.  ROS:   Please see the history of present illness.    All other systems reviewed and are negative.  EKGs/Labs/Other Studies Reviewed:    The following studies were reviewed today: Echo 06/12/20: IMPRESSIONS     1. Left ventricular ejection fraction, by estimation, is 45 to 50%. The  left ventricle has mildly decreased function. The left ventricle  demonstrates regional wall motion abnormalities (see scoring  diagram/findings for description). There is mild  concentric left ventricular hypertrophy. Left ventricular diastolic  parameters are consistent with Grade II diastolic dysfunction  (pseudonormalization). Elevated left atrial pressure. There is moderate  hypokinesis of the left ventricular, basal-mid  inferior wall and inferolateral wall.   2. Right ventricular systolic function is mildly reduced. The right  ventricular size is normal. There is mildly elevated pulmonary artery  systolic pressure.   3. Left atrial size was severely dilated.   4. Right atrial size was moderately dilated.   5. The mitral valve is normal in structure. Mild to moderate mitral valve  regurgitation.   6. The aortic valve has been repaired/replaced. Aortic valve  regurgitation is trivial. No aortic stenosis is present. There is a 29 mm  Sapien prosthetic (TAVR) valve present in the aortic position. Aortic  valve mean gradient measures 6.0 mmHg. Aortic  valve Vmax measures 1.82 m/s.   7. The inferior vena cava is dilated in size with <50% respiratory  variability, suggesting right atrial pressure of 15 mmHg.   Comparison(s): Prior images unable to be directly viewed, comparison made  by report only. The left ventricular function is worsened. The left  ventricular wall motion abnormality is unchanged.   EKG:  EKG is not ordered today.    Recent  Labs: 06/12/2020: ALT 25; B Natriuretic Peptide 1,215.1 06/21/2020: Hemoglobin 13.2; Magnesium 2.3; Platelets 191 07/21/2020: BUN 24; Creatinine, Ser 1.06; Potassium 4.7; Sodium 135  Recent Lipid Panel    Component Value Date/Time   CHOL 149 08/28/2017 0350   TRIG 95 08/28/2017 0350   HDL 52 08/28/2017 0350   CHOLHDL 2.9 08/28/2017 0350   VLDL 19 08/28/2017 0350   LDLCALC 78 08/28/2017 0350  Risk Assessment/Calculations:    CHA2DS2-VASc Score = 5   This indicates a 7.2% annual risk of stroke. The patient's score is based upon: CHF History: 1 HTN History: 1 Diabetes History: 0 Stroke History: 0 Vascular Disease History: 1 Age Score: 2 Gender Score: 0          Physical Exam:    VS:  BP (!) 110/56    Pulse 66    Ht 5\' 8"  (1.727 m)    Wt 167 lb (75.8 kg)    SpO2 98%    BMI 25.39 kg/m     Wt Readings from Last 3 Encounters:  02/11/21 167 lb (75.8 kg)  11/24/20 168 lb 12.8 oz (76.6 kg)  07/21/20 164 lb 9.6 oz (74.7 kg)     GEN:  Well nourished, well developed in no acute distress HEENT: Normal NECK: No JVD; No carotid bruits LYMPHATICS: No lymphadenopathy CARDIAC: RRR, 2/6 early peaking systolic murmur at the RUSB, no diastolic murmur RESPIRATORY:  Clear to auscultation without rales, wheezing or rhonchi  ABDOMEN: Soft, non-tender, non-distended MUSCULOSKELETAL:  1+ edema right pretibial region with chronic stasis changes, trace edema on the left; No deformity  SKIN: Warm and dry NEUROLOGIC:  Alert and oriented x 3 PSYCHIATRIC:  Normal affect   ASSESSMENT:    1. Chronic systolic heart failure ([place])   2. Aortic valve disease   3. Permanent atrial fibrillation ([place])   4. Coronary artery disease involving native coronary artery of native heart with angina pectoris ([place])    PLAN:    In order of problems listed above:  Patient is well compensated.  Continue isosorbide, furosemide, Entresto, and spironolactone.  Repeat echocardiogram at the time of his next visit  in 6 months.  Check labs today to include a metabolic panel and CBC. Patient status post TAVR.  Exam is unchanged and suggests a normally functioning TAVR valve.  Repeat echocardiogram in 6 months. Tolerating oral anticoagulation with warfarin.  Remained stable. Managed medically with isosorbide succinate.  No recent symptoms.  Currently stable., metoprolol succinate. No recent symptoms. Currently stable. Follow-up 6 months.     Medication Adjustments/Labs and Tests Ordered: Current medicines are reviewed at length with the patient today.  Concerns regarding medicines are outlined above.  Orders Placed This Encounter  Procedures   Basic metabolic panel   CBC   ECHOCARDIOGRAM COMPLETE   No orders of the defined types were placed in this encounter.   Patient Instructions  Medication Instructions:  Your physician recommends that you continue on your current medications as directed. Please refer to the Current Medication list given to you today.  *If you need a refill on your cardiac medications before your next appointment, please call your pharmacy*   Lab Work: CBC and BMET today  If you have labs (blood work) drawn today and your tests are completely normal, you will receive your results only by: [place] (if you have MyChart) OR A paper copy in the mail If you have any lab test that is abnormal or we need to change your treatment, we will call you to review the results.   Testing/Procedures: Your physician has requested that you have an echocardiogram on May 24 before your visit with Dr. Burt Knack at 2:40pm. Echocardiography is a painless test that uses sound waves to create images of your heart. It provides your doctor with information about the size and shape of your heart and how well your hearts chambers and valves are working. This procedure takes approximately  one hour. There are no restrictions for this procedure.    Follow-Up: At [facility], you and your health  needs are our priority.  As part of our continuing mission to provide you with exceptional heart care, we have created designated Provider Care Teams.  These Care Teams include your primary Cardiologist (physician) and Advanced Practice Providers (APPs -  Physician Assistants and Nurse Practitioners) who all work together to provide you with the care you need, when you need it.  We recommend signing up for the patient portal called "MyChart".  Sign up information is provided on this After Visit Summary.  MyChart is used to connect with patients for Virtual Visits (Telemedicine).  Patients are able to view lab/test results, encounter notes, upcoming appointments, etc.  Non-urgent messages can be sent to your provider as well.   To learn more about what you can do with MyChart, go to [email or web address].    Your next appointment:   May 24 at 2:40 pm  Provider:   Sherren Mocha, MD {       Signed, Sherren Mocha, MD  02/11/2021 5:24 PM    [place]

## 2021-02-11 NOTE — Patient Instructions (Signed)
Medication Instructions:  Your physician recommends that you continue on your current medications as directed. Please refer to the Current Medication list given to you today.  *If you need a refill on your cardiac medications before your next appointment, please call your pharmacy*   Lab Work: CBC and BMET today  If you have labs (blood work) drawn today and your tests are completely normal, you will receive your results only by: [place] (if you have MyChart) OR A paper copy in the mail If you have any lab test that is abnormal or we need to change your treatment, we will call you to review the results.   Testing/Procedures: Your physician has requested that you have an echocardiogram on May 24 before your visit with Dr. Burt Knack at 2:40pm. Echocardiography is a painless test that uses sound waves to create images of your heart. It provides your doctor with information about the size and shape of your heart and how well your hearts chambers and valves are working. This procedure takes approximately one hour. There are no restrictions for this procedure.    Follow-Up: At [facility], you and your health needs are our priority.  As part of our continuing mission to provide you with exceptional heart care, we have created designated Provider Care Teams.  These Care Teams include your primary Cardiologist (physician) and Advanced Practice Providers (APPs -  Physician Assistants and Nurse Practitioners) who all work together to provide you with the care you need, when you need it.  We recommend signing up for the patient portal called "MyChart".  Sign up information is provided on this After Visit Summary.  MyChart is used to connect with patients for Virtual Visits (Telemedicine).  Patients are able to view lab/test results, encounter notes, upcoming appointments, etc.  Non-urgent messages can be sent to your provider as well.   To learn more about what you can do with MyChart, go to  [email or web address].    Your next appointment:   May 24 at 2:40 pm  Provider:   Sherren Mocha, MD {

## 2021-02-12 LAB — CBC
Hematocrit: 36.8 % — ABNORMAL LOW (ref 37.5–51.0)
Hemoglobin: 12.2 g/dL — ABNORMAL LOW (ref 13.0–17.7)
MCH: 30.9 pg (ref 26.6–33.0)
MCHC: 33.2 g/dL (ref 31.5–35.7)
MCV: 93 fL (ref 79–97)
Platelets: 141 10*3/uL — ABNORMAL LOW (ref 150–450)
RBC: 3.95 x10E6/uL — ABNORMAL LOW (ref 4.14–5.80)
RDW: 13.7 % (ref 11.6–15.4)
WBC: 5.3 10*3/uL (ref 3.4–10.8)

## 2021-02-12 LAB — BASIC METABOLIC PANEL
BUN/Creatinine Ratio: 35 — ABNORMAL HIGH (ref 10–24)
BUN: 52 mg/dL — ABNORMAL HIGH (ref 10–36)
CO2: 22 mmol/L (ref 20–29)
Calcium: 9 mg/dL (ref 8.6–10.2)
Chloride: 111 mmol/L — ABNORMAL HIGH (ref 96–106)
Creatinine, Ser: 1.48 mg/dL — ABNORMAL HIGH (ref 0.76–1.27)
Glucose: 103 mg/dL — ABNORMAL HIGH (ref 70–99)
Potassium: 5.4 mmol/L — ABNORMAL HIGH (ref 3.5–5.2)
Sodium: 144 mmol/L (ref 134–144)
eGFR: 44 mL/min/{1.73_m2} — ABNORMAL LOW (ref >59)

## 2021-02-14 DIAGNOSIS — Z20828 Contact with and (suspected) exposure to other viral communicable diseases: Secondary | ICD-10-CM | POA: Diagnosis not present

## 2021-02-16 ENCOUNTER — Other Ambulatory Visit: Payer: Self-pay

## 2021-02-16 ENCOUNTER — Ambulatory Visit (INDEPENDENT_AMBULATORY_CARE_PROVIDER_SITE_OTHER): Payer: Medicare Other | Admitting: Podiatry

## 2021-02-16 ENCOUNTER — Telehealth: Payer: Self-pay

## 2021-02-16 DIAGNOSIS — Z79899 Other long term (current) drug therapy: Secondary | ICD-10-CM

## 2021-02-16 DIAGNOSIS — M79675 Pain in left toe(s): Secondary | ICD-10-CM

## 2021-02-16 DIAGNOSIS — I130 Hypertensive heart and chronic kidney disease with heart failure and stage 1 through stage 4 chronic kidney disease, or unspecified chronic kidney disease: Secondary | ICD-10-CM

## 2021-02-16 DIAGNOSIS — B351 Tinea unguium: Secondary | ICD-10-CM

## 2021-02-16 DIAGNOSIS — I420 Dilated cardiomyopathy: Secondary | ICD-10-CM

## 2021-02-16 DIAGNOSIS — M79674 Pain in right toe(s): Secondary | ICD-10-CM

## 2021-02-16 DIAGNOSIS — I10 Essential (primary) hypertension: Secondary | ICD-10-CM

## 2021-02-16 NOTE — Telephone Encounter (Signed)
Reviewed lab with Dr Burt Knack.  Dr Burt Knack requests to confirm pt is taking Spironolactone as medication is showing expired on his med list in [place].   Spoke with Denton Ar, head nurse at [facility] where pt resides who confirms pt is taking Spironolactone 25mg  - 1 tablet by mouth daily.  Per Dr Burt Knack pt should stop Spironolactone and recheck BMET in 2 weeks.  Attempted phone call to [facility] to relay Dr Antionette Char orders and left message on voicemail for Denton Ar to call RN tomorrow at [phone].  Will forward to triage to follow up on 02/17/2021.

## 2021-02-17 DIAGNOSIS — H353231 Exudative age-related macular degeneration, bilateral, with active choroidal neovascularization: Secondary | ICD-10-CM | POA: Diagnosis not present

## 2021-02-17 NOTE — Telephone Encounter (Signed)
Called facility and was transferred to a VM but it was full.  Called back and was transferred to another VM and left message to call back.

## 2021-02-18 NOTE — Addendum Note (Signed)
Addended by: Thora Lance on: 02/18/2021 11:46 AM   Modules accepted: Orders

## 2021-02-18 NOTE — Telephone Encounter (Signed)
Phone call to facility and spoke with Lesly Rubenstein, head nurse and advised per Dr Burt Knack, stop Spironolactone and recheck BMET in 2 weeks.  Lesly Rubenstein requests that RN contact pt to schedule lab appointment and thanked RN for the call.

## 2021-02-18 NOTE — Telephone Encounter (Signed)
Spoke with pt and advised of need for BMET per Dr Burt Knack.  Appointment scheduled for 03/03/2021 at 3pm.  Pt verbalizes understanding and agrees with current plan.

## 2021-02-20 ENCOUNTER — Encounter: Payer: Self-pay | Admitting: Podiatry

## 2021-02-20 NOTE — Progress Notes (Signed)
°  Subjective:  Patient ID: Tyler Williams, male    DOB: 02/13/1927,  MRN: [id number]  Tyler Williams presents to clinic today for painful elongated mycotic toenails 1-5 bilaterally which are tender when wearing enclosed shoe gear. Pain is relieved with periodic professional debridement.  Patient is accompanied by his daughter, [name],  on today's visit. He voices no new pedal problems on today's visit.  PCP is Burnard Bunting, MD , and last visit was 08/04/2020.  Allergies  Allergen Reactions   No Known Allergies Other (See Comments)    NKA    Review of Systems: Negative except as noted in the HPI. Objective:   Constitutional Tyler Williams is a pleasant 88 y.o. Caucasian male, WD, WN in NAD. AAO x 3.   Vascular CFT <3 seconds b/l LE. Palpable DP/PT pulses b/l LE. Digital hair absent b/l. Skin temperature gradient WNL b/l. No pain with calf compression b/l. No edema noted b/l. No cyanosis or clubbing noted b/l LE.  Neurologic Normal speech. Oriented to person, place, and time. Protective sensation intact 5/5 intact bilaterally with 10g monofilament b/l. Vibratory sensation intact b/l.  Dermatologic Pedal integument with normal turgor, texture and tone b/l LE. No open wounds b/l. No interdigital macerations b/l. Toenails 1-5 b/l elongated, thickened, discolored with subungual debris. +Tenderness with dorsal palpation of nailplates. No hyperkeratotic or porokeratotic lesions present.  Orthopedic: Muscle strength 5/5 to all lower extremity muscle groups bilaterally. 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   Radiographs: None   Assessment:   1. Pain due to onychomycosis of toenails of both feet    Plan:  Patient was evaluated and treated and all questions answered. Consent given for treatment as described below: -Mycotic toenails 1-5 bilaterally were debrided in length and girth with sterile nail nippers and dremel without  incident. -Patient/POA to call should there be question/concern in the interim.  No follow-ups on file.  Marzetta Board, DPM

## 2021-02-21 DIAGNOSIS — Z20828 Contact with and (suspected) exposure to other viral communicable diseases: Secondary | ICD-10-CM | POA: Diagnosis not present

## 2021-02-23 DIAGNOSIS — C44329 Squamous cell carcinoma of skin of other parts of face: Secondary | ICD-10-CM | POA: Diagnosis not present

## 2021-02-23 DIAGNOSIS — D692 Other nonthrombocytopenic purpura: Secondary | ICD-10-CM | POA: Diagnosis not present

## 2021-02-23 DIAGNOSIS — L821 Other seborrheic keratosis: Secondary | ICD-10-CM | POA: Diagnosis not present

## 2021-02-23 DIAGNOSIS — Z85828 Personal history of other malignant neoplasm of skin: Secondary | ICD-10-CM | POA: Diagnosis not present

## 2021-02-23 DIAGNOSIS — L853 Xerosis cutis: Secondary | ICD-10-CM | POA: Diagnosis not present

## 2021-02-23 DIAGNOSIS — L570 Actinic keratosis: Secondary | ICD-10-CM | POA: Diagnosis not present

## 2021-02-27 ENCOUNTER — Other Ambulatory Visit (HOSPITAL_COMMUNITY): Payer: Self-pay | Admitting: Adult Health

## 2021-02-28 ENCOUNTER — Other Ambulatory Visit (HOSPITAL_COMMUNITY): Payer: Self-pay | Admitting: Cardiovascular Disease

## 2021-03-03 ENCOUNTER — Other Ambulatory Visit: Payer: Medicare Other

## 2021-03-03 ENCOUNTER — Other Ambulatory Visit: Payer: Self-pay

## 2021-03-03 DIAGNOSIS — I10 Essential (primary) hypertension: Secondary | ICD-10-CM

## 2021-03-03 DIAGNOSIS — I420 Dilated cardiomyopathy: Secondary | ICD-10-CM | POA: Diagnosis not present

## 2021-03-03 DIAGNOSIS — Z79899 Other long term (current) drug therapy: Secondary | ICD-10-CM | POA: Diagnosis not present

## 2021-03-03 DIAGNOSIS — I130 Hypertensive heart and chronic kidney disease with heart failure and stage 1 through stage 4 chronic kidney disease, or unspecified chronic kidney disease: Secondary | ICD-10-CM

## 2021-03-04 LAB — BASIC METABOLIC PANEL
BUN/Creatinine Ratio: 28 — ABNORMAL HIGH (ref 10–24)
BUN: 33 mg/dL (ref 10–36)
CO2: 24 mmol/L (ref 20–29)
Calcium: 9.2 mg/dL (ref 8.6–10.2)
Chloride: 101 mmol/L (ref 96–106)
Creatinine, Ser: 1.19 mg/dL (ref 0.76–1.27)
Glucose: 82 mg/dL (ref 70–99)
Potassium: 4.9 mmol/L (ref 3.5–5.2)
Sodium: 140 mmol/L (ref 134–144)
eGFR: 57 mL/min/{1.73_m2} — ABNORMAL LOW (ref >59)

## 2021-03-13 DIAGNOSIS — N182 Chronic kidney disease, stage 2 (mild): Secondary | ICD-10-CM | POA: Diagnosis not present

## 2021-03-13 DIAGNOSIS — I509 Heart failure, unspecified: Secondary | ICD-10-CM | POA: Diagnosis not present

## 2021-03-13 DIAGNOSIS — E785 Hyperlipidemia, unspecified: Secondary | ICD-10-CM | POA: Diagnosis not present

## 2021-03-13 DIAGNOSIS — I130 Hypertensive heart and chronic kidney disease with heart failure and stage 1 through stage 4 chronic kidney disease, or unspecified chronic kidney disease: Secondary | ICD-10-CM | POA: Diagnosis not present

## 2021-03-30 DIAGNOSIS — N1831 Chronic kidney disease, stage 3a: Secondary | ICD-10-CM | POA: Diagnosis not present

## 2021-03-30 DIAGNOSIS — R609 Edema, unspecified: Secondary | ICD-10-CM | POA: Diagnosis not present

## 2021-03-30 DIAGNOSIS — I130 Hypertensive heart and chronic kidney disease with heart failure and stage 1 through stage 4 chronic kidney disease, or unspecified chronic kidney disease: Secondary | ICD-10-CM | POA: Diagnosis not present

## 2021-03-30 DIAGNOSIS — E663 Overweight: Secondary | ICD-10-CM | POA: Diagnosis not present

## 2021-03-30 DIAGNOSIS — Z7901 Long term (current) use of anticoagulants: Secondary | ICD-10-CM | POA: Diagnosis not present

## 2021-03-30 DIAGNOSIS — I700 Atherosclerosis of aorta: Secondary | ICD-10-CM | POA: Diagnosis not present

## 2021-03-30 DIAGNOSIS — J449 Chronic obstructive pulmonary disease, unspecified: Secondary | ICD-10-CM | POA: Diagnosis not present

## 2021-03-31 ENCOUNTER — Other Ambulatory Visit: Payer: Self-pay | Admitting: Cardiovascular Disease

## 2021-03-31 DIAGNOSIS — H353231 Exudative age-related macular degeneration, bilateral, with active choroidal neovascularization: Secondary | ICD-10-CM | POA: Diagnosis not present

## 2021-03-31 DIAGNOSIS — Z7901 Long term (current) use of anticoagulants: Secondary | ICD-10-CM | POA: Diagnosis not present

## 2021-04-01 ENCOUNTER — Other Ambulatory Visit (HOSPITAL_COMMUNITY): Payer: Self-pay

## 2021-04-13 ENCOUNTER — Telehealth: Payer: Self-pay | Admitting: Cardiovascular Disease

## 2021-04-13 NOTE — Telephone Encounter (Signed)
? ?  [facility] Pre-operative Risk Assessment  ?  ?Request for surgical clearance: ? ?What type of surgery is being performed?  ?Standard Dental Cleaning  ? ?When is this surgery scheduled?  ?04/13/21  ? ?What type of clearance is required (medical clearance vs. Pharmacy clearance to hold med vs. Both)?  ?Both  ? ?Are there any medications that need to be held prior to surgery and how long? ?No - Their office would like to know whether the patient needs to pre-med for dental work ? ?Practice name and name of physician performing surgery?  ?Office of Dr. Anna Genre, DMD Dentistry  Dr. Anna Genre, DMD ? ?What is your office phone number? ?[phone]  ?  ?7.   What is your office fax number? ?[phone] ? ?8.   Anesthesia type (None, local, MAC, general) ?  ?None ? ? ?Zara Council ?04/13/2021, 3:32 PM  ?

## 2021-04-13 NOTE — Telephone Encounter (Signed)
Spoke with clinic staff and instructed that patient will need SBE prophylaxis given hx of TVAR.  ?

## 2021-04-13 NOTE — Telephone Encounter (Signed)
Pre-op covering unavailable for transfer. Please return call as able. ?

## 2021-04-20 ENCOUNTER — Telehealth: Payer: Self-pay | Admitting: Cardiovascular Disease

## 2021-04-20 NOTE — Telephone Encounter (Signed)
See other phone note from today.

## 2021-04-20 NOTE — Telephone Encounter (Signed)
I returned call to Tyler Williams (patient's son).  He is currently with patient. Patient gave permission to speak with his son and daughter when needed. ?Son reports patient has swelling in lower legs. Started about a week ago. Does not weigh daily but patient thinks he has gained weight. Some shortness of breath in the last week. He resides at [facility] and medicines are provided by staff.  Son thinks patient is taking medications as listed.  ?Patient also complains of burning when urinating.  He checked temperature while on the phone and it is 99.3 ?I scheduled patient to see Dr Acie Fredrickson on 04/22/21 at 10:30 for evaluation of swelling and shortness of breath.  I asked son and patient to contact PCP or MD at [facility] regarding possible urinary tract infection.  ?

## 2021-04-20 NOTE — Telephone Encounter (Signed)
Pt c/o swelling: STAT is pt has developed SOB within 24 hours ? ?If swelling, where is the swelling located? Swelling in legs and ankles ? ?How much weight have you gained and in what time span? N/A ? ?Have you gained 3 pounds in a day or 5 pounds in a week? N/A ? ?Do you have a log of your daily weights (if so, list)? No  ? ?Are you currently taking a fluid pill? Yes  ? ?Are you currently SOB? N/A ? ?Have you traveled recently? No   ?

## 2021-04-20 NOTE — Telephone Encounter (Signed)
I placed call to patient to get permission to speak with his daughter. Left message to call office.  I returned call to patient's daughter and explained I would need patient's permission to speak with her.  She will have patient call office.  ?

## 2021-04-20 NOTE — Telephone Encounter (Signed)
Pt c/o swelling: STAT is pt has developed SOB within 24 hours ? ?If swelling, where is the swelling located?  Left upper thigh-  Patient is [place] and they called the daughter to report this to her ? ?How much weight have you gained and in what time span? She did not know about the weight gain ? ?Have you gained 3 pounds in a day or 5 pounds in a week?  ? ?Do you have a log of your daily weights (if so, list)?  ? ?Are you currently taking a fluid pill? yes ? ?Are you currently SOB? no ? ?Have you traveled recently? No- daughter wanted patient to be seenis  ? ?

## 2021-04-21 DIAGNOSIS — R609 Edema, unspecified: Secondary | ICD-10-CM | POA: Diagnosis not present

## 2021-04-21 DIAGNOSIS — I130 Hypertensive heart and chronic kidney disease with heart failure and stage 1 through stage 4 chronic kidney disease, or unspecified chronic kidney disease: Secondary | ICD-10-CM | POA: Diagnosis not present

## 2021-04-21 DIAGNOSIS — I739 Peripheral vascular disease, unspecified: Secondary | ICD-10-CM | POA: Diagnosis not present

## 2021-04-21 DIAGNOSIS — N1831 Chronic kidney disease, stage 3a: Secondary | ICD-10-CM | POA: Diagnosis not present

## 2021-04-21 DIAGNOSIS — N390 Urinary tract infection, site not specified: Secondary | ICD-10-CM | POA: Diagnosis not present

## 2021-04-21 DIAGNOSIS — I509 Heart failure, unspecified: Secondary | ICD-10-CM | POA: Diagnosis not present

## 2021-04-21 DIAGNOSIS — R0602 Shortness of breath: Secondary | ICD-10-CM | POA: Diagnosis not present

## 2021-04-21 DIAGNOSIS — Z7901 Long term (current) use of anticoagulants: Secondary | ICD-10-CM | POA: Diagnosis not present

## 2021-04-21 DIAGNOSIS — R300 Dysuria: Secondary | ICD-10-CM | POA: Diagnosis not present

## 2021-04-21 DIAGNOSIS — I482 Chronic atrial fibrillation, unspecified: Secondary | ICD-10-CM | POA: Diagnosis not present

## 2021-04-21 DIAGNOSIS — R82998 Other abnormal findings in urine: Secondary | ICD-10-CM | POA: Diagnosis not present

## 2021-04-21 NOTE — Progress Notes (Signed)
Cardiology Office Note:    Date:  04/22/2021   ID:  Tyler Williams, DOB 1926/11/21, MRN [id number]  PCP:  Geoffry Paradise, MD   [facility] Providers Cardiologist:  Tonny Bollman, MD     Referring MD: Geoffry Paradise, MD   Chief Complaint  Patient presents with   Leg Swelling    History of Present Illness:    Seen with son,Tyler Williams  Tyler Williams is a 88 y.o. male with a hx of atrial fib, CAD, mild systolic CHF, PAD, s/p TAVR  He has been having increasing leg edema   1 year ago, he had extensive leg edema ,  was started on Entresto  Was found to have UTI.   Was also on amlodipine  Now has had leg edema , also found to have UTI  Started on Abx.   Son says leg edema is better. Wt is down 2 lbs from yesterday   Eats Jimmie Dean breakfasts - likely has lots of salt  Has a lounge doctor leg rest - advised him to use it more  Is on Lasix 40 mg a day  Has urgency and frequency . Has seen Dr. Retta Diones.       Past Medical History:  Diagnosis Date   A-fib [facility])    AAA (abdominal aortic aneurysm)    s/p repair   Aortic stenosis, mild    a. mean gradient 17 mmHg on 04/2012 TTE   Arthritis    knees   CAD (coronary artery disease)    a. s/p multiple percutaneous prior coronary artery interventions b. 05/2012: unsuccessful PCI to tight RCA->medically managed   Cancer (HCC)    skin cancer removed, right hand, left leg, chest   Carotid artery disease (HCC)    0-39% bilateral ICA stenoses 11/2011   Chronic anticoagulation    Coumadin   Coronary artery disease    GERD (gastroesophageal reflux disease)    GERD (gastroesophageal reflux disease)    History of pulmonary embolism    1964 related to dislocated hip on right    History of skin cancer    Hypercholesteremia    Hypertension    Incidental cecal mass noted on CT imaging 05/24/2016   **An incidental finding of potential clinical significance has been found. 4.6 x 5.6 x 3.7 cm masslike lesion  in the cecum adjacent to the ileocecal valve may represent a large polyp or colonic neoplasm. 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 Unable to pass wire past RCA lesion->medically managed   CHOLECYSTECTOMY N/A 12/05/2012   Procedure: LAPAROSCOPIC CHOLECYSTECTOMY ;  Surgeon: Mariella Saa, MD;  Location: [facility]  OR;  Service: General;  Laterality: N/A;   CHOLECYSTECTOMY     CORONARY ANGIOPLASTY  1980, 1990   CORONARY BALLOON ANGIOPLASTY N/A 08/31/2017   Procedure: CORONARY BALLOON ANGIOPLASTY;  Surgeon: Swaziland, Peter M, MD;  Location: [facility] INVASIVE CV LAB;  Service: Cardiovascular;  Laterality: N/A;   EYE SURGERY     hx of cataract surgery    HEMORRHOID SURGERY     1973   HERNIA REPAIR     right inguinal hernia repair    JOINT REPLACEMENT     left knee replacement, partial right   LEFT HEART CATH AND CORONARY ANGIOGRAPHY N/A 08/31/2017   Procedure: LEFT HEART CATH AND CORONARY ANGIOGRAPHY;  Surgeon: Swaziland, Peter M, MD;  Location: [facility] INVASIVE CV LAB;  Service: Cardiovascular;  Laterality: N/A;   LEFT HEART CATHETERIZATION WITH CORONARY ANGIOGRAM N/A 05/24/2012   Procedure:  LEFT HEART CATHETERIZATION WITH CORONARY ANGIOGRAM;  Surgeon: Kathleene Hazel, MD;  Location: [facility] CATH LAB;  Service: Cardiovascular;  Laterality: N/A;   ORTHOPEDIC SURGERY     OTHER SURGICAL HISTORY     right knee popliteal aneurysm surgery stent placed    OTHER SURGICAL HISTORY     PARTIAL KNEE ARTHROPLASTY  01/22/2012   Procedure: UNICOMPARTMENTAL KNEE;  Surgeon: [name], MD;  Location: [facility] ORS;  Service: Orthopedics;  Laterality: Right;   PERCUTANEOUS CORONARY INTERVENTION-BALLOON ONLY  05/24/2012   Procedure: PERCUTANEOUS CORONARY INTERVENTION-BALLOON ONLY;  Surgeon: Kathleene Hazel, MD;  Location: [facility] CATH LAB;  Service: Cardiovascular;;   RIGHT/LEFT HEART CATH AND CORONARY ANGIOGRAPHY N/A 05/12/2016   Procedure: Right/Left Heart Cath and Coronary Angiography;  Surgeon: Tonny Bollman, MD;  Location: [facility] INVASIVE CV LAB;  Service: Cardiovascular;  Laterality: N/A;   TEE WITHOUT CARDIOVERSION N/A 06/13/2016   Procedure: TRANSESOPHAGEAL ECHOCARDIOGRAM (TEE);  Surgeon: Tonny Bollman, MD;  Location: [facility] OR;  Service: Open Heart Surgery;  Laterality: N/A;   TONSILLECTOMY     TOTAL KNEE ARTHROPLASTY Left    TRANSCATHETER AORTIC VALVE REPLACEMENT, TRANSFEMORAL N/A 06/13/2016   Procedure: TRANSCATHETER AORTIC VALVE REPLACEMENT, TRANSFEMORAL;  Surgeon: Tonny Bollman, MD;  Location: [facility] OR;  Service: Open Heart Surgery;  Laterality: N/A;    Current Medications: Current Meds  Medication Sig   aspirin EC 81 MG tablet Take 81 mg by mouth daily.   atorvastatin (LIPITOR) 20 MG tablet TAKE 1 TABLET ONCE DAILY.   Calcium-Magnesium-Zinc (CAL-MAG-ZINC PO) Take 100 mg by mouth daily.   Cholecalciferol (VITAMIN D3) 5000 units TABS Take 5,000 Units by mouth daily.   fesoterodine (TOVIAZ) 8 MG TB24 tablet Take 8 mg by mouth every evening.   finasteride (PROSCAR) 5 MG tablet Take 1 tablet (5 mg total) by mouth daily.   furosemide (LASIX) 80 MG tablet Patient takes 1/2 tablet by mouth daily (40  mg total)   isosorbide mononitrate (IMDUR) 60 MG 24 hr tablet Take 3 tablets (180 mg total) by mouth daily. Please keep appointment in May 2023 for future refills. Thank you   LORazepam (ATIVAN) 1 MG tablet Take 1 mg by mouth at bedtime.   metoprolol succinate (TOPROL-XL) 25 MG 24 hr tablet TAKE 1 TABLET EACH DAY.   Multiple Vitamins-Minerals (PRESERVISION AREDS) CAPS Take 1 tablet by mouth daily.   nitroGLYCERIN (NITROSTAT) 0.4 MG SL tablet Place 0.4 mg under the tongue every 5 (five) minutes as needed for chest pain.   sacubitril-valsartan (ENTRESTO) 24-26 MG Take 1 tablet by mouth 2 (two) times daily.   spironolactone (ALDACTONE) 25 MG tablet Take 1 tablet (  25 mg total) by mouth daily.   tamsulosin (FLOMAX) 0.4 MG CAPS capsule Take 1 capsule (0.4 mg total) by mouth daily.   warfarin (COUMADIN) 5 MG tablet Take 0.5 tablets (2.5 mg total) by mouth daily.     Allergies:   No known allergies   Social History   Socioeconomic History   Marital status: Widowed    Spouse name: Not on file   Number of children: Not on file   Years of education: Not on file   Highest education level: Not on file  Occupational History   Not on file  Tobacco Use   Smoking status: Never   Smokeless tobacco: Never  Vaping Use   Vaping Use: Never used  Substance and Sexual Activity   Alcohol use: Not Currently    Comment: patient doesn't drink anymore   Drug use: Never   Sexual activity: Not on file  Other Topics Concern   Not on file  Social History Narrative   ** Merged History Encounter **       [employer] [place]; played football; score touchdown against [employer]   Social Determinants of Health   Financial Resource Strain: Not on file  Food Insecurity: Not on file  Transportation Needs: Not on file  Physical Activity: Not on file  Stress: Not on file  Social Connections: Not on file     Family History: The patient's family history includes Heart attack (age of onset: 65) in his  mother.  ROS:   Please see the history of present illness.     All other systems reviewed and are negative.  EKGs/Labs/Other Studies Reviewed:    The following studies were reviewed today:   EKG:   April 22, 2021: Atrial fibrillation with a heart rate of 64.  Right bundle branch block.  Recent Labs: 06/12/2020: ALT 25; B Natriuretic Peptide 1,215.1 06/21/2020: Magnesium 2.3 02/11/2021: Hemoglobin 12.2; Platelets 141 03/03/2021: BUN 33; Creatinine, Ser 1.19; Potassium 4.9; Sodium 140  Recent Lipid Panel    Component Value Date/Time   CHOL 149 08/28/2017 0350   TRIG 95 08/28/2017 0350   HDL 52 08/28/2017 0350   CHOLHDL 2.9 08/28/2017 0350   VLDL 19 08/28/2017 0350   LDLCALC 78 08/28/2017 0350     Risk Assessment/Calculations:      Physical Exam:    VS:  BP 140/64   Pulse 64   Ht 5\' 8"  (1.727 m)   Wt 179 lb 12.8 oz (81.6 kg)   SpO2 93%   BMI 27.34 kg/m     Wt Readings from Last 3 Encounters:  04/22/21 179 lb 12.8 oz (81.6 kg)  02/11/21 167 lb (75.8 kg)  11/24/20 168 lb 12.8 oz (76.6 kg)     GEN: elderly man,  somwehat frail in no acute distress HEENT: Normal NECK: No JVD; No carotid bruits LYMPHATICS: No lymphadenopathy CARDIAC: irreg. Irreg. , very soft systolic murmur  RESPIRATORY:  Clear to auscultation without rales, wheezing or rhonchi  ABDOMEN: Soft, non-tender, non-distended MUSCULOSKELETAL: 3 + edema up to thighs,  No deformity  SKIN: Warm and dry NEUROLOGIC:  Alert and oriented x 3 PSYCHIATRIC:  Normal affect   ASSESSMENT:    1. Leg edema   2. Hypertensive heart and chronic kidney disease with heart failure and stage 1 through stage 4 chronic kidney disease, or unspecified chronic kidney disease (HCC)    PLAN:      Leg edema: Check is had episodes of leg edema in the past.  Curiously,  his last severe episode of leg edema was associated with a urinary tract infection.  He apparently has a urinary tract infection now.  He eats a very high salt  diet.  He he eats Vilma Meckel breakfast almost every day.  He does not add any salt to his meals.  He has a lounge doctor leg rest and uses it occasionally. His son since states that his leg edema is better compared to yesterday after starting antibiotics for his urinary tract infection.  It may be that he has some general inflammation from his UTI that is causing his capillaries to be more leaky.  Continue current dose of Lasix 40 mg a day.  I have given him the okay to take an extra dose of Lasix around 3 or 4 in the afternoon if he thinks that he can tolerate getting up and going to the bathroom.  He already has some stress urgency and incontinence after taking the Lasix.  I encouraged him to use his lounge doctor leg rest for several hours a day.  Of asked him to pick a different breakfast instead of the [employer] breakfast as I think that those are very salty.   Will return to see Dr. Excell Seltzer or APP in several months           Medication Adjustments/Labs and Tests Ordered: Current medicines are reviewed at length with the patient today.  Concerns regarding medicines are outlined above.  Orders Placed This Encounter  Procedures   EKG 12-Lead   ECHOCARDIOGRAM COMPLETE   No orders of the defined types were placed in this encounter.   Patient Instructions  Medication Instructions:  Your physician recommends that you continue on your current medications as directed. Please refer to the Current Medication list given to you today.  *If you need a refill on your cardiac medications before your next appointment, please call your pharmacy*   Lab Work: NONE If you have labs (blood work) drawn today and your tests are completely normal, you will receive your results only by: MyChart Message (if you have MyChart) OR A paper copy in the mail If you have any lab test that is abnormal or we need to change your treatment, we will call you to review the results.   Testing/Procedures: Your  physician has requested that you have an echocardiogram. Echocardiography is a painless test that uses sound waves to create images of your heart. It provides your doctor with information about the size and shape of your heart and how well your hearts chambers and valves are working. This procedure takes approximately one hour. There are no restrictions for this procedure.    Follow-Up: At [facility], you and your health needs are our priority.  As part of our continuing mission to provide you with exceptional heart care, we have created designated Provider Care Teams.  These Care Teams include your primary Cardiologist (physician) and Advanced Practice Providers (APPs -  Physician Assistants and Nurse Practitioners) who all work together to provide you with the care you need, when you need it.  Your next appointment:   3 month(s)  The format for your next appointment:   In Person  Provider:   Tonny Bollman, MD  or Chelsea Aus, PA-C, Eligha Bridegroom, NP, or Tereso Newcomer, PA-C   Signed, Kristeen Miss, MD  04/22/2021 5:54 PM    [facility]

## 2021-04-22 ENCOUNTER — Ambulatory Visit (INDEPENDENT_AMBULATORY_CARE_PROVIDER_SITE_OTHER): Payer: Medicare Other | Admitting: Cardiovascular Disease

## 2021-04-22 ENCOUNTER — Encounter: Payer: Self-pay | Admitting: Cardiovascular Disease

## 2021-04-22 ENCOUNTER — Other Ambulatory Visit: Payer: Self-pay

## 2021-04-22 VITALS — BP 140/64 | HR 64 | Ht 68.0 in | Wt 179.8 lb

## 2021-04-22 DIAGNOSIS — R600 Localized edema: Secondary | ICD-10-CM | POA: Diagnosis not present

## 2021-04-22 DIAGNOSIS — I130 Hypertensive heart and chronic kidney disease with heart failure and stage 1 through stage 4 chronic kidney disease, or unspecified chronic kidney disease: Secondary | ICD-10-CM

## 2021-04-22 NOTE — Patient Instructions (Signed)
Medication Instructions:  ?Your physician recommends that you continue on your current medications as directed. Please refer to the Current Medication list given to you today. ? ?*If you need a refill on your cardiac medications before your next appointment, please call your pharmacy* ? ? ?Lab Work: ?NONE ?If you have labs (blood work) drawn today and your tests are completely normal, you will receive your results only by: ?MyChart Message (if you have MyChart) OR ?A paper copy in the mail ?If you have any lab test that is abnormal or we need to change your treatment, we will call you to review the results. ? ? ?Testing/Procedures: ?Your physician has requested that you have an echocardiogram. Echocardiography is a painless test that uses sound waves to create images of your heart. It provides your doctor with information about the size and shape of your heart and how well your heart?s chambers and valves are working. This procedure takes approximately one hour. There are no restrictions for this procedure.  ? ? ?Follow-Up: ?At [facility], you and your health needs are our priority.  As part of our continuing mission to provide you with exceptional heart care, we have created designated Provider Care Teams.  These Care Teams include your primary Cardiologist (physician) and Advanced Practice Providers (APPs -  Physician Assistants and Nurse Practitioners) who all work together to provide you with the care you need, when you need it. ? ?Your next appointment:   ?3 month(s) ? ?The format for your next appointment:   ?In Person ? ?Provider:   ?Sherren Mocha, MD  or Robbie Lis, PA-C, Christen Bame, NP, or [name], PA-C ?

## 2021-04-27 ENCOUNTER — Emergency Department (HOSPITAL_COMMUNITY): Payer: Medicare Other

## 2021-04-27 ENCOUNTER — Emergency Department (HOSPITAL_COMMUNITY)
Admission: EM | Admit: 2021-04-27 | Discharge: 2021-04-27 | Disposition: A | Discharge status: Home / Self Care | Payer: Medicare Other | Attending: Emergency Medicine | Admitting: Emergency Medicine

## 2021-04-27 DIAGNOSIS — Y92002 Bathroom of unspecified non-institutional (private) residence single-family (private) house as the place of occurrence of the external cause: Secondary | ICD-10-CM | POA: Diagnosis not present

## 2021-04-27 DIAGNOSIS — N3941 Urge incontinence: Secondary | ICD-10-CM | POA: Diagnosis not present

## 2021-04-27 DIAGNOSIS — R338 Other retention of urine: Secondary | ICD-10-CM | POA: Diagnosis not present

## 2021-04-27 DIAGNOSIS — W19XXXA Unspecified fall, initial encounter: Secondary | ICD-10-CM | POA: Diagnosis not present

## 2021-04-27 DIAGNOSIS — M4319 Spondylolisthesis, multiple sites in spine: Secondary | ICD-10-CM | POA: Diagnosis not present

## 2021-04-27 DIAGNOSIS — M47812 Spondylosis without myelopathy or radiculopathy, cervical region: Secondary | ICD-10-CM | POA: Diagnosis not present

## 2021-04-27 DIAGNOSIS — S0990XA Unspecified injury of head, initial encounter: Secondary | ICD-10-CM | POA: Diagnosis not present

## 2021-04-27 DIAGNOSIS — W01198A Fall on same level from slipping, tripping and stumbling with subsequent striking against other object, initial encounter: Secondary | ICD-10-CM | POA: Insufficient documentation

## 2021-04-27 DIAGNOSIS — Z23 Encounter for immunization: Secondary | ICD-10-CM | POA: Diagnosis not present

## 2021-04-27 DIAGNOSIS — Z7982 Long term (current) use of aspirin: Secondary | ICD-10-CM | POA: Diagnosis not present

## 2021-04-27 DIAGNOSIS — Z7901 Long term (current) use of anticoagulants: Secondary | ICD-10-CM | POA: Insufficient documentation

## 2021-04-27 DIAGNOSIS — I2510 Atherosclerotic heart disease of native coronary artery without angina pectoris: Secondary | ICD-10-CM | POA: Diagnosis not present

## 2021-04-27 DIAGNOSIS — I6782 Cerebral ischemia: Secondary | ICD-10-CM | POA: Diagnosis not present

## 2021-04-27 DIAGNOSIS — N3281 Overactive bladder: Secondary | ICD-10-CM | POA: Diagnosis not present

## 2021-04-27 DIAGNOSIS — S51812A Laceration without foreign body of left forearm, initial encounter: Secondary | ICD-10-CM | POA: Insufficient documentation

## 2021-04-27 DIAGNOSIS — S0001XA Abrasion of scalp, initial encounter: Secondary | ICD-10-CM | POA: Insufficient documentation

## 2021-04-27 DIAGNOSIS — Z9889 Other specified postprocedural states: Secondary | ICD-10-CM | POA: Diagnosis not present

## 2021-04-27 DIAGNOSIS — R9431 Abnormal electrocardiogram [ECG] [EKG]: Secondary | ICD-10-CM | POA: Diagnosis not present

## 2021-04-27 DIAGNOSIS — I517 Cardiomegaly: Secondary | ICD-10-CM | POA: Diagnosis not present

## 2021-04-27 DIAGNOSIS — G319 Degenerative disease of nervous system, unspecified: Secondary | ICD-10-CM | POA: Diagnosis not present

## 2021-04-27 DIAGNOSIS — S299XXA Unspecified injury of thorax, initial encounter: Secondary | ICD-10-CM | POA: Diagnosis not present

## 2021-04-27 DIAGNOSIS — S59912A Unspecified injury of left forearm, initial encounter: Secondary | ICD-10-CM | POA: Diagnosis present

## 2021-04-27 DIAGNOSIS — I6381 Other cerebral infarction due to occlusion or stenosis of small artery: Secondary | ICD-10-CM | POA: Diagnosis not present

## 2021-04-27 DIAGNOSIS — R58 Hemorrhage, not elsewhere classified: Secondary | ICD-10-CM | POA: Diagnosis not present

## 2021-04-27 DIAGNOSIS — I700 Atherosclerosis of aorta: Secondary | ICD-10-CM | POA: Diagnosis not present

## 2021-04-27 LAB — CBC WITH DIFFERENTIAL/PLATELET
Abs Immature Granulocytes: 0.04 10*3/uL (ref 0.00–0.07)
Basophils Absolute: 0.1 10*3/uL (ref 0.0–0.1)
Basophils Relative: 1 %
Eosinophils Absolute: 0 10*3/uL (ref 0.0–0.5)
Eosinophils Relative: 1 %
HCT: 39.9 % (ref 39.0–52.0)
Hemoglobin: 12.8 g/dL — ABNORMAL LOW (ref 13.0–17.0)
Immature Granulocytes: 1 %
Lymphocytes Relative: 19 %
Lymphs Abs: 1 10*3/uL (ref 0.7–4.0)
MCH: 30 pg (ref 26.0–34.0)
MCHC: 32.1 g/dL (ref 30.0–36.0)
MCV: 93.7 fL (ref 80.0–100.0)
Monocytes Absolute: 0.4 10*3/uL (ref 0.1–1.0)
Monocytes Relative: 8 %
Neutro Abs: 3.7 10*3/uL (ref 1.7–7.7)
Neutrophils Relative %: 70 %
Platelets: 124 10*3/uL — ABNORMAL LOW (ref 150–400)
RBC: 4.26 MIL/uL (ref 4.22–5.81)
RDW: 15.1 % (ref 11.5–15.5)
WBC: 5.2 10*3/uL (ref 4.0–10.5)
nRBC: 0 % (ref 0.0–0.2)

## 2021-04-27 LAB — PROTIME-INR
INR: 3.7 — ABNORMAL HIGH (ref 0.8–1.2)
Prothrombin Time: 36.9 seconds — ABNORMAL HIGH (ref 11.4–15.2)

## 2021-04-27 LAB — BASIC METABOLIC PANEL
Anion gap: 9 (ref 5–15)
BUN: 24 mg/dL — ABNORMAL HIGH (ref 8–23)
CO2: 25 mmol/L (ref 22–32)
Calcium: 9.4 mg/dL (ref 8.9–10.3)
Chloride: 103 mmol/L (ref 98–111)
Creatinine, Ser: 1.38 mg/dL — ABNORMAL HIGH (ref 0.61–1.24)
GFR, Estimated: 48 mL/min — ABNORMAL LOW (ref >60)
Glucose, Bld: 104 mg/dL — ABNORMAL HIGH (ref 70–99)
Potassium: 4 mmol/L (ref 3.5–5.1)
Sodium: 137 mmol/L (ref 135–145)

## 2021-04-27 MED ORDER — TETANUS-DIPHTH-ACELL PERTUSSIS 5-2.5-18.5 LF-MCG/0.5 IM SUSY
0.5000 mL | PREFILLED_SYRINGE | Freq: Once | INTRAMUSCULAR | Status: AC
  Administered 2021-04-27: 0.5 mL via INTRAMUSCULAR
  Filled 2021-04-27: qty 0.5

## 2021-04-27 NOTE — ED Provider Notes (Signed)
?[facility] ?Provider Note ? ? ?CSN: [id number] ?Arrival date & time: 04/27/21  1700 ? ?  ? ?History ? ?Chief Complaint  ?Patient presents with  ? Fall  ? ? ?Tyler Williams is a 88 y.o. male. ? ?88 year old male with prior medical history as detailed below presents for evaluation.  Patient reports that he was at his urologist for routine appointment.  He was maneuvering in the bathroom.  After standing from the toilet he lost his balance.  He reports that the bathroom was very small.  He did not fell and struck his head.  He denies LOC.  He denies neck pain.  He denies other injury.  He does have a superficial abrasion to the posterior scalp.  He also has a small skin tear to the left forearm.  He does take Coumadin. ? ?He denies chest pain or back pain.  He denies shortness of breath.  He denies lower extremity or hip pain.  He was able to ambulate after the fall. ? ?The history is provided by the patient and medical records.  ?Fall ?This is a new problem. The current episode started less than 1 hour ago. The problem occurs rarely. The problem has not changed since onset.Pertinent negatives include no chest pain and no abdominal pain. Nothing aggravates the symptoms. Nothing relieves the symptoms.  ? ?  ? ?Home Medications ?Prior to Admission medications   ?Medication Sig Start Date End Date Taking? Authorizing Provider  ?aspirin EC 81 MG tablet Take 81 mg by mouth daily.    [provider]  ?atorvastatin (LIPITOR) 20 MG tablet TAKE 1 TABLET ONCE DAILY. 12/30/20   Sherren Mocha, MD  ?Calcium-Magnesium-Zinc (CAL-MAG-ZINC PO) Take 100 mg by mouth daily.    [provider]  ?Cholecalciferol (VITAMIN D3) 5000 units TABS Take 5,000 Units by mouth daily.    [provider]  ?fesoterodine (TOVIAZ) 8 MG TB24 tablet Take 8 mg by mouth every evening.    [provider]  ?finasteride (PROSCAR) 5 MG tablet Take 1 tablet (5 mg total) by mouth  daily. 06/21/20   Charlynne Cousins, MD  ?furosemide (LASIX) 80 MG tablet Patient takes 1/2 tablet by mouth daily (40 mg total)    [provider]  ?isosorbide mononitrate (IMDUR) 60 MG 24 hr tablet Take 3 tablets (180 mg total) by mouth daily. Please keep appointment in May 2023 for future refills. Thank you 03/31/21   Sherren Mocha, MD  ?LORazepam (ATIVAN) 1 MG tablet Take 1 mg by mouth at bedtime.    [provider]  ?metoprolol succinate (TOPROL-XL) 25 MG 24 hr tablet TAKE 1 TABLET EACH DAY. 09/20/20   Sherren Mocha, MD  ?Multiple Vitamins-Minerals (PRESERVISION AREDS) CAPS Take 1 tablet by mouth daily. 03/04/09   [provider]  ?nitroGLYCERIN (NITROSTAT) 0.4 MG SL tablet Place 0.4 mg under the tongue every 5 (five) minutes as needed for chest pain.    [provider]  ?sacubitril-valsartan (ENTRESTO) 24-26 MG Take 1 tablet by mouth 2 (two) times daily. 07/22/20   Sherren Mocha, MD  ?spironolactone (ALDACTONE) 25 MG tablet Take 1 tablet (25 mg total) by mouth daily. 06/28/20 04/22/21  Clegg, Amy D, NP  ?tamsulosin (FLOMAX) 0.4 MG CAPS capsule Take 1 capsule (0.4 mg total) by mouth daily. 06/21/20   Charlynne Cousins, MD  ?warfarin (COUMADIN) 5 MG tablet Take 0.5 tablets (2.5 mg total) by mouth daily. 06/21/20   Charlynne Cousins, MD  ?   ? ?  Allergies    ?No known allergies   ? ?Review of Systems   ?Review of Systems  ?Cardiovascular:  Negative for chest pain.  ?Gastrointestinal:  Negative for abdominal pain.  ?All other systems reviewed and are negative. ? ?Physical Exam ?Updated Vital Signs ?BP (!) 152/53   Pulse 60   Temp 98.2 ?F (36.8 ?C)   Resp 16   SpO2 95%  ?Physical Exam ?Vitals and nursing note reviewed.  ?Constitutional:   ?   General: He is not in acute distress. ?   Appearance: Normal appearance. He is well-developed.  ?HENT:  ?   Head: Normocephalic.  ?   Comments: Superficial linear abrasion to the posterior scalp.  No active bleeding. ?Eyes:  ?    Conjunctiva/sclera: Conjunctivae normal.  ?   Pupils: Pupils are equal, round, and reactive to light.  ?Cardiovascular:  ?   Rate and Rhythm: Normal rate and regular rhythm.  ?   Heart sounds: Normal heart sounds.  ?Pulmonary:  ?   Effort: Pulmonary effort is normal. No respiratory distress.  ?   Breath sounds: Normal breath sounds.  ?Abdominal:  ?   General: There is no distension.  ?   Palpations: Abdomen is soft.  ?   Tenderness: There is no abdominal tenderness.  ?Musculoskeletal:     ?   General: No deformity. Normal range of motion.  ?   Cervical back: Normal range of motion and neck supple.  ?Skin: ?   General: Skin is warm and dry.  ?   Comments: Small skin tear to the distal left forearm.  No active bleeding.  ?Neurological:  ?   General: No focal deficit present.  ?   Mental Status: He is alert and oriented to person, place, and time.  ?   Comments: Alert and oriented x4, GCS 15  ? ? ?ED Results / Procedures / Treatments   ?Labs ?(all labs ordered are listed, but only abnormal results are displayed) ?Labs Reviewed  ?CBC WITH DIFFERENTIAL/PLATELET - Abnormal; Notable for the following components:  ?    Result Value  ? Hemoglobin 12.8 (*)   ? Platelets 124 (*)   ? All other components within normal limits  ?PROTIME-INR - Abnormal; Notable for the following components:  ? Prothrombin Time 36.9 (*)   ? INR 3.7 (*)   ? All other components within normal limits  ?BASIC METABOLIC PANEL  ?TYPE AND SCREEN  ? ? ?EKG ?None ? ?Radiology ?DG Chest Port 1 View ? ?Result Date: 04/27/2021 ?CLINICAL DATA:  [place], hit head, anticoagulated EXAM: PORTABLE CHEST 1 VIEW COMPARISON:  06/15/2020 FINDINGS: Single frontal view of the chest demonstrates postsurgical changes from aortic valve replacement. The cardiac silhouette is mildly enlarged but stable. Stable aortic atherosclerosis. Lung volumes are slightly diminished. No acute airspace disease, effusion, or pneumothorax. No acute displaced fracture. IMPRESSION: 1. No acute  intrathoracic process. Electronically Signed   By: Randa Ngo M.D.   On: 04/27/2021 17:45   ? ?Procedures ?Procedures  ? ? ?Medications Ordered in ED ?Medications  ?Tdap (BOOSTRIX) injection 0.5 mL (has no administration in time range)  ? ? ?ED Course/ Medical Decision Making/ A&P ?  ?                        ?Medical Decision Making ?Amount and/or Complexity of Data Reviewed ?Labs: ordered. ?Radiology: ordered. ? ?Risk ?Prescription drug management. ? ? ? ?Medical Screen Complete ? ?This patient presented to the ED  with complaint of fall with head injury. ? ?This complaint involves an extensive number of treatment options. The initial differential diagnosis includes, but is not limited to, intracranial injury related to fall, other traumatic injury, coagulopathy, metabolic abnormality, etc. ? ?This presentation is: Acute, Chronic, Self-Limited, Previously Undiagnosed, Uncertain Prognosis, Complicated, Systemic Symptoms, and Threat to Life/Bodily Function ?Patient presents after mechanical fall with striking of his head.  He is on Coumadin. ? ?INR today is 3.7. ? ?CT imaging is without evidence of acute traumatic injury ? ?Patient is comfortable and desires DC home. ? ?He and his son understand need for close follow-up.  Strict return precautions given and understood. ? ? ?Co morbidities that complicated the patient's evaluation ? ?Advanced age, on coumadin ? ? ?Additional history obtained: ? ?Additional history obtained from EMS ?External records from outside sources obtained and reviewed including prior ED visits and prior Inpatient records.  ? ? ?Lab Tests: ? ?I ordered and personally interpreted labs.  The pertinent results include: CBC, BMP, INR ? ? ?Imaging Studies ordered: ? ?I ordered imaging studies including head, CT C-spine, chest x-ray ?I independently visualized and interpreted obtained imaging which showed no acute pathology ?I agree with the radiologist interpretation. ? ? ?Cardiac  Monitoring: ? ?The patient was maintained on a cardiac monitor.  I personally viewed and interpreted the cardiac monitor which showed an underlying rhythm of: NSR ? ? ?Medicines ordered: ? ?I ordered medication including Tdap for tetanu

## 2021-04-27 NOTE — ED Notes (Signed)
Patient transported to CT 

## 2021-04-27 NOTE — Discharge Instructions (Signed)
Return for any problem. ? ?Your INR today was 3.7.  Please check with your outpatient care provider who monitors your Coumadin levels. ? ?If you develop headache, weakness, confusion please return to the ED for evaluation. ? ? ?

## 2021-04-27 NOTE — Progress Notes (Signed)
Orthopedic Tech Progress Note ?Patient Details:  ?Tyler Williams ?09/09/1926 ?[id number] ? ?Level 2 trauma  ? ?Patient ID: [name], male   DOB: 10/02/1926, 88 y.o.   MRN: [id number] ? ?[name] ?04/27/2021, 5:39 PM ? ?

## 2021-04-27 NOTE — ED Triage Notes (Signed)
Via EMS. Pt came from urology appt. Pt had fall from standing position hitting head on toilet. Pt on blood thinners. Denies LOC, lac to posterior scalp, bleeding controlled. GCS 15 ?

## 2021-04-28 DIAGNOSIS — I482 Chronic atrial fibrillation, unspecified: Secondary | ICD-10-CM | POA: Diagnosis not present

## 2021-04-28 DIAGNOSIS — R609 Edema, unspecified: Secondary | ICD-10-CM | POA: Diagnosis not present

## 2021-04-28 DIAGNOSIS — N390 Urinary tract infection, site not specified: Secondary | ICD-10-CM | POA: Diagnosis not present

## 2021-04-28 DIAGNOSIS — I509 Heart failure, unspecified: Secondary | ICD-10-CM | POA: Diagnosis not present

## 2021-04-28 DIAGNOSIS — R55 Syncope and collapse: Secondary | ICD-10-CM | POA: Diagnosis not present

## 2021-04-28 DIAGNOSIS — M25552 Pain in left hip: Secondary | ICD-10-CM | POA: Diagnosis not present

## 2021-05-04 ENCOUNTER — Other Ambulatory Visit (HOSPITAL_COMMUNITY): Payer: Medicare Other

## 2021-05-14 ENCOUNTER — Emergency Department (HOSPITAL_COMMUNITY)
Admission: EM | Admit: 2021-05-14 | Discharge: 2021-05-14 | Disposition: A | Discharge status: Home / Self Care | Payer: Medicare Other | Attending: Emergency Medicine | Admitting: Emergency Medicine

## 2021-05-14 ENCOUNTER — Other Ambulatory Visit: Payer: Self-pay

## 2021-05-14 ENCOUNTER — Encounter (HOSPITAL_COMMUNITY): Payer: Self-pay

## 2021-05-14 DIAGNOSIS — R0602 Shortness of breath: Secondary | ICD-10-CM | POA: Diagnosis not present

## 2021-05-14 DIAGNOSIS — I509 Heart failure, unspecified: Secondary | ICD-10-CM | POA: Insufficient documentation

## 2021-05-14 DIAGNOSIS — Z7982 Long term (current) use of aspirin: Secondary | ICD-10-CM | POA: Diagnosis not present

## 2021-05-14 DIAGNOSIS — R58 Hemorrhage, not elsewhere classified: Secondary | ICD-10-CM | POA: Diagnosis not present

## 2021-05-14 DIAGNOSIS — X58XXXA Exposure to other specified factors, initial encounter: Secondary | ICD-10-CM | POA: Diagnosis not present

## 2021-05-14 DIAGNOSIS — Z7901 Long term (current) use of anticoagulants: Secondary | ICD-10-CM | POA: Diagnosis not present

## 2021-05-14 DIAGNOSIS — R791 Abnormal coagulation profile: Secondary | ICD-10-CM | POA: Diagnosis not present

## 2021-05-14 DIAGNOSIS — S41111A Laceration without foreign body of right upper arm, initial encounter: Secondary | ICD-10-CM | POA: Diagnosis not present

## 2021-05-14 DIAGNOSIS — S8992XA Unspecified injury of left lower leg, initial encounter: Secondary | ICD-10-CM | POA: Diagnosis present

## 2021-05-14 DIAGNOSIS — S81811A Laceration without foreign body, right lower leg, initial encounter: Secondary | ICD-10-CM | POA: Insufficient documentation

## 2021-05-14 DIAGNOSIS — S81812A Laceration without foreign body, left lower leg, initial encounter: Secondary | ICD-10-CM | POA: Diagnosis not present

## 2021-05-14 DIAGNOSIS — S41112A Laceration without foreign body of left upper arm, initial encounter: Secondary | ICD-10-CM | POA: Diagnosis not present

## 2021-05-14 DIAGNOSIS — I10 Essential (primary) hypertension: Secondary | ICD-10-CM | POA: Diagnosis not present

## 2021-05-14 LAB — PROTIME-INR
INR: 5.9 (ref 0.8–1.2)
Prothrombin Time: 52.8 seconds — ABNORMAL HIGH (ref 11.4–15.2)

## 2021-05-14 LAB — BASIC METABOLIC PANEL
Anion gap: 8 (ref 5–15)
BUN: 31 mg/dL — ABNORMAL HIGH (ref 8–23)
CO2: 18 mmol/L — ABNORMAL LOW (ref 22–32)
Calcium: 9 mg/dL (ref 8.9–10.3)
Chloride: 111 mmol/L (ref 98–111)
Creatinine, Ser: 1.02 mg/dL (ref 0.61–1.24)
GFR, Estimated: >60 mL/min (ref >60)
Glucose, Bld: 97 mg/dL (ref 70–99)
Potassium: 4.3 mmol/L (ref 3.5–5.1)
Sodium: 137 mmol/L (ref 135–145)

## 2021-05-14 LAB — CBC WITH DIFFERENTIAL/PLATELET
Abs Immature Granulocytes: 0.01 10*3/uL (ref 0.00–0.07)
Basophils Absolute: 0.1 10*3/uL (ref 0.0–0.1)
Basophils Relative: 1 %
Eosinophils Absolute: 0.1 10*3/uL (ref 0.0–0.5)
Eosinophils Relative: 1 %
HCT: 33.1 % — ABNORMAL LOW (ref 39.0–52.0)
Hemoglobin: 10.4 g/dL — ABNORMAL LOW (ref 13.0–17.0)
Immature Granulocytes: 0 %
Lymphocytes Relative: 15 %
Lymphs Abs: 0.8 10*3/uL (ref 0.7–4.0)
MCH: 30.1 pg (ref 26.0–34.0)
MCHC: 31.4 g/dL (ref 30.0–36.0)
MCV: 95.7 fL (ref 80.0–100.0)
Monocytes Absolute: 0.4 10*3/uL (ref 0.1–1.0)
Monocytes Relative: 8 %
Neutro Abs: 4.1 10*3/uL (ref 1.7–7.7)
Neutrophils Relative %: 75 %
Platelets: 145 10*3/uL — ABNORMAL LOW (ref 150–400)
RBC: 3.46 MIL/uL — ABNORMAL LOW (ref 4.22–5.81)
RDW: 17 % — ABNORMAL HIGH (ref 11.5–15.5)
WBC: 5.4 10*3/uL (ref 4.0–10.5)
nRBC: 0 % (ref 0.0–0.2)

## 2021-05-14 NOTE — ED Triage Notes (Addendum)
Via EMS from  nursing home. -[place] . Pt here d/t a skin tear on his R lower leg. Nurse tech was helping the patient taking off his pants, then made a skin tear on his R lower leg, couldn't control the bleeding until fire department got there. Bleeding now controlled.a&o x4. Vss. Pt on WARFARIN for afib ? ?

## 2021-05-14 NOTE — ED Provider Notes (Signed)
?[name] ?Provider Note ? ? ?CSN: [id number] ?Arrival date & time: 05/14/21  1520 ? ?  ?History ? ?Chief Complaint  ?Patient presents with  ? skin tear  ? ? ?Tyler Williams is a 88 y.o. male with past medical history significant for CHF, A-fib, mechanical valve on chronic anticoagulation here for evaluation of lower extremity wounds.  Apparently nursing facility went to take his compression socks off yesterday where patient developed skin tears to his bilateral anterior shins.  They were unable to stop the bleeding subsequently called EMS.  Patient denies any complaints.  No headache, lightheadedness, dizziness, chest pain, numbness, weakness.  States his lower extremity edema is at baseline.  He is compliant with his medications are given to him at his nursing facility. ? ?[place] up to date ? ?HPI ? ?  ? ?Home Medications ?Prior to Admission medications   ?Medication Sig Start Date End Date Taking? Authorizing Provider  ?aspirin EC 81 MG tablet Take 81 mg by mouth daily.    [provider]  ?atorvastatin (LIPITOR) 20 MG tablet TAKE 1 TABLET ONCE DAILY. 12/30/20   Sherren Mocha, MD  ?Calcium-Magnesium-Zinc (CAL-MAG-ZINC PO) Take 100 mg by mouth daily.    [provider]  ?Cholecalciferol (VITAMIN D3) 5000 units TABS Take 5,000 Units by mouth daily.    [provider]  ?fesoterodine (TOVIAZ) 8 MG TB24 tablet Take 8 mg by mouth every evening.    [provider]  ?finasteride (PROSCAR) 5 MG tablet Take 1 tablet (5 mg total) by mouth daily. 06/21/20   Charlynne Cousins, MD  ?furosemide (LASIX) 80 MG tablet Patient takes 1/2 tablet by mouth daily (40 mg total)    [provider]  ?isosorbide mononitrate (IMDUR) 60 MG 24 hr tablet Take 3 tablets (180 mg total) by mouth daily. Please keep appointment in May 2023 for future refills. Thank you 03/31/21   Sherren Mocha, MD  ?LORazepam (ATIVAN) 1 MG tablet Take 1 mg by mouth at  bedtime.    [provider]  ?metoprolol succinate (TOPROL-XL) 25 MG 24 hr tablet TAKE 1 TABLET EACH DAY. 09/20/20   Sherren Mocha, MD  ?Multiple Vitamins-Minerals (PRESERVISION AREDS) CAPS Take 1 tablet by mouth daily. 03/04/09   [provider]  ?nitroGLYCERIN (NITROSTAT) 0.4 MG SL tablet Place 0.4 mg under the tongue every 5 (five) minutes as needed for chest pain.    [provider]  ?sacubitril-valsartan (ENTRESTO) 24-26 MG Take 1 tablet by mouth 2 (two) times daily. 07/22/20   Sherren Mocha, MD  ?spironolactone (ALDACTONE) 25 MG tablet Take 1 tablet (25 mg total) by mouth daily. 06/28/20 04/22/21  Clegg, Amy D, NP  ?tamsulosin (FLOMAX) 0.4 MG CAPS capsule Take 1 capsule (0.4 mg total) by mouth daily. 06/21/20   Charlynne Cousins, MD  ?warfarin (COUMADIN) 5 MG tablet Take 0.5 tablets (2.5 mg total) by mouth daily. 06/21/20   Charlynne Cousins, MD  ?   ? ?Allergies    ?No known allergies   ? ?Review of Systems   ?Review of Systems  ?Constitutional: Negative.   ?HENT: Negative.    ?Respiratory: Negative.    ?Cardiovascular: Negative.   ?Gastrointestinal: Negative.   ?Genitourinary: Negative.   ?Musculoskeletal: Negative.   ?Skin:  Positive for wound.  ?Neurological: Negative.   ?All other systems reviewed and are negative. ? ?Physical Exam ?Updated Vital Signs ?BP (!) 152/68 (BP Location: Right Arm)   Pulse 61   Temp 98.3 ?F (36.8 ?  C) (Oral)   Resp 16   Ht '5\' 8"'$  (1.727 m)   Wt 79.4 kg   SpO2 98%   BMI 26.61 kg/m?  ?Physical Exam ?Vitals and nursing note reviewed.  ?Constitutional:   ?   General: He is not in acute distress. ?   Appearance: He is well-developed. He is not ill-appearing, toxic-appearing or diaphoretic.  ?HENT:  ?   Head: Atraumatic.  ?   Nose: Nose normal.  ?   Mouth/Throat:  ?   Mouth: Mucous membranes are moist.  ?Eyes:  ?   Pupils: Pupils are equal, round, and reactive to light.  ?Cardiovascular:  ?   Rate and Rhythm: Normal rate and regular rhythm.  ?   Pulses:  Normal pulses.     ?     Dorsalis pedis pulses are 2+ on the right side and 2+ on the left side.  ?     Posterior tibial pulses are 2+ on the right side and 2+ on the left side.  ?Pulmonary:  ?   Effort: Pulmonary effort is normal. No respiratory distress.  ?   Breath sounds: Normal breath sounds.  ?Abdominal:  ?   General: Bowel sounds are normal. There is no distension.  ?   Palpations: Abdomen is soft.  ?Musculoskeletal:     ?   General: Normal range of motion.  ?   Cervical back: Normal range of motion and neck supple.  ?   Comments: No bony tenderness, Full ROM, compartments soft  ?Skin: ?   General: Skin is warm and dry.  ?   Comments: 1+ pitting edema BLE. Skin tear right tib/fib at 2.5 cm superficial, slight oozing. Left mid anterior tib/fib 2 cm skin tear, slight oozing. See picture in chart  ?Neurological:  ?   General: No focal deficit present.  ?   Mental Status: He is alert and oriented to person, place, and time.  ?   Motor: No weakness.  ?   Coordination: Coordination normal.  ? ? ? ? ? ?ED Results / Procedures / Treatments   ?Labs ?(all labs ordered are listed, but only abnormal results are displayed) ?Labs Reviewed  ?CBC WITH DIFFERENTIAL/PLATELET - Abnormal; Notable for the following components:  ?    Result Value  ? RBC 3.46 (*)   ? Hemoglobin 10.4 (*)   ? HCT 33.1 (*)   ? RDW 17.0 (*)   ? Platelets 145 (*)   ? All other components within normal limits  ?BASIC METABOLIC PANEL - Abnormal; Notable for the following components:  ? CO2 18 (*)   ? BUN 31 (*)   ? All other components within normal limits  ?PROTIME-INR - Abnormal; Notable for the following components:  ? Prothrombin Time 52.8 (*)   ? INR 5.9 (*)   ? All other components within normal limits  ? ? ?EKG ?None ? ?Radiology ?No results found. ? ?Procedures ?Wound packing ? ?Date/Time: 05/14/2021 7:33 PM ?Performed by: Shelby Dubin A, PA-C ?Authorized by: Nettie Elm, PA-C  ?Consent: Verbal consent obtained. Written consent not  obtained. ?Risks and benefits: risks, benefits and alternatives were discussed ?Consent given by: patient ?Patient understanding: patient states understanding of the procedure being performed ?Patient consent: the patient's understanding of the procedure matches consent given ?Procedure consent: procedure consent matches procedure scheduled ?Relevant documents: relevant documents present and verified ?Test results: test results available and properly labeled ?Site marked: the operative site was marked ?Imaging studies: imaging studies available ?Required items:  required blood products, implants, devices, and special equipment available ?Patient identity confirmed: verbally with patient and arm band ?Time out: Immediately prior to procedure a "time out" was called to verify the correct patient, procedure, equipment, support staff and site/side marked as required. ?Preparation: Patient was prepped and draped in the usual sterile fashion. ?Local anesthesia used: no ? ?Anesthesia: ?Local anesthesia used: no ? ?Sedation: ?Patient sedated: no ? ?Patient tolerance: patient tolerated the procedure well with no immediate complications ? ?  ? ? ?Medications Ordered in ED ?Medications - No data to display ? ?ED Course/ Medical Decision Making/ A&P ?  ?88 year old chronic coumadin for mec valve/afib here for evaluation of skin tears to bilateral shins.  No bony tenderness, full range of motion.  1+ edema to bilateral lower extremities with chronic venous stasis skin changes.  Does have some skin tears which are mildly oozing.  Pressure dressing placed with cessation of bleeding.  No lacerations to suture.  Nursing facility concerned with amount of bleeding, will check labs, INR ? ?Labs personally viewed and interpreted: ? ?CBC Hgb 10.4 suspect due to recent bleeding, baseline 12 ?BMP without significant abnormality ?INR 5.9 ? ?Patient reassessed. Wounds assessed after 4 hours> no active bleeding.  Replace bandaging. Discussed  labs with patient and family . Will have him hold Coumadin and FU with PCP Monday for repeat labs. He denies lightheadedness, dizziness, melena, blood per rectum, epistaxis. He will return for new or worsening symptom

## 2021-05-14 NOTE — Discharge Instructions (Addendum)
Do Not remove the leg dressings for 2 days ? ?Hold your Coumadin for 2 days. Have doctor on Monday repeat your labs as they may need to make medication doses since INR was 5.9 today ? ?Return for new or worsening symptoms ?

## 2021-05-17 ENCOUNTER — Encounter (HOSPITAL_COMMUNITY): Payer: Self-pay

## 2021-05-17 ENCOUNTER — Emergency Department (HOSPITAL_COMMUNITY)
Admission: EM | Admit: 2021-05-17 | Discharge: 2021-05-17 | Disposition: A | Discharge status: Home / Self Care | Payer: Medicare Other | Attending: Emergency Medicine | Admitting: Emergency Medicine

## 2021-05-17 ENCOUNTER — Other Ambulatory Visit: Payer: Self-pay

## 2021-05-17 ENCOUNTER — Emergency Department (HOSPITAL_COMMUNITY): Payer: Medicare Other

## 2021-05-17 DIAGNOSIS — W08XXXA Fall from other furniture, initial encounter: Secondary | ICD-10-CM | POA: Diagnosis not present

## 2021-05-17 DIAGNOSIS — R0602 Shortness of breath: Secondary | ICD-10-CM | POA: Diagnosis not present

## 2021-05-17 DIAGNOSIS — R000 Tachycardia, unspecified: Secondary | ICD-10-CM | POA: Diagnosis not present

## 2021-05-17 DIAGNOSIS — Y9389 Activity, other specified: Secondary | ICD-10-CM | POA: Diagnosis not present

## 2021-05-17 DIAGNOSIS — W19XXXA Unspecified fall, initial encounter: Secondary | ICD-10-CM

## 2021-05-17 DIAGNOSIS — S41111A Laceration without foreign body of right upper arm, initial encounter: Secondary | ICD-10-CM | POA: Insufficient documentation

## 2021-05-17 DIAGNOSIS — Z7901 Long term (current) use of anticoagulants: Secondary | ICD-10-CM | POA: Diagnosis not present

## 2021-05-17 DIAGNOSIS — Z79899 Other long term (current) drug therapy: Secondary | ICD-10-CM | POA: Diagnosis not present

## 2021-05-17 DIAGNOSIS — R001 Bradycardia, unspecified: Secondary | ICD-10-CM | POA: Diagnosis not present

## 2021-05-17 DIAGNOSIS — R062 Wheezing: Secondary | ICD-10-CM | POA: Diagnosis not present

## 2021-05-17 DIAGNOSIS — I10 Essential (primary) hypertension: Secondary | ICD-10-CM | POA: Diagnosis not present

## 2021-05-17 DIAGNOSIS — S0990XA Unspecified injury of head, initial encounter: Secondary | ICD-10-CM | POA: Diagnosis not present

## 2021-05-17 DIAGNOSIS — Z7982 Long term (current) use of aspirin: Secondary | ICD-10-CM | POA: Diagnosis not present

## 2021-05-17 DIAGNOSIS — I4891 Unspecified atrial fibrillation: Secondary | ICD-10-CM | POA: Diagnosis not present

## 2021-05-17 DIAGNOSIS — S4991XA Unspecified injury of right shoulder and upper arm, initial encounter: Secondary | ICD-10-CM | POA: Diagnosis present

## 2021-05-17 LAB — CBC WITH DIFFERENTIAL/PLATELET
Abs Immature Granulocytes: 0.02 10*3/uL (ref 0.00–0.07)
Basophils Absolute: 0 10*3/uL (ref 0.0–0.1)
Basophils Relative: 0 %
Eosinophils Absolute: 0 10*3/uL (ref 0.0–0.5)
Eosinophils Relative: 0 %
HCT: 31.6 % — ABNORMAL LOW (ref 39.0–52.0)
Hemoglobin: 10 g/dL — ABNORMAL LOW (ref 13.0–17.0)
Immature Granulocytes: 0 %
Lymphocytes Relative: 3 %
Lymphs Abs: 0.1 10*3/uL — ABNORMAL LOW (ref 0.7–4.0)
MCH: 29.9 pg (ref 26.0–34.0)
MCHC: 31.6 g/dL (ref 30.0–36.0)
MCV: 94.6 fL (ref 80.0–100.0)
Monocytes Absolute: 0.1 10*3/uL (ref 0.1–1.0)
Monocytes Relative: 2 %
Neutro Abs: 4.5 10*3/uL (ref 1.7–7.7)
Neutrophils Relative %: 95 %
Platelets: 127 10*3/uL — ABNORMAL LOW (ref 150–400)
RBC: 3.34 MIL/uL — ABNORMAL LOW (ref 4.22–5.81)
RDW: 17.2 % — ABNORMAL HIGH (ref 11.5–15.5)
WBC: 4.8 10*3/uL (ref 4.0–10.5)
nRBC: 0 % (ref 0.0–0.2)

## 2021-05-17 LAB — COMPREHENSIVE METABOLIC PANEL
ALT: 15 U/L (ref 0–44)
AST: 18 U/L (ref 15–41)
Albumin: 3.3 g/dL — ABNORMAL LOW (ref 3.5–5.0)
Alkaline Phosphatase: 95 U/L (ref 38–126)
Anion gap: 7 (ref 5–15)
BUN: 31 mg/dL — ABNORMAL HIGH (ref 8–23)
CO2: 22 mmol/L (ref 22–32)
Calcium: 8.8 mg/dL — ABNORMAL LOW (ref 8.9–10.3)
Chloride: 110 mmol/L (ref 98–111)
Creatinine, Ser: 0.84 mg/dL (ref 0.61–1.24)
GFR, Estimated: >60 mL/min (ref >60)
Glucose, Bld: 116 mg/dL — ABNORMAL HIGH (ref 70–99)
Potassium: 3.8 mmol/L (ref 3.5–5.1)
Sodium: 139 mmol/L (ref 135–145)
Total Bilirubin: 1.4 mg/dL — ABNORMAL HIGH (ref 0.3–1.2)
Total Protein: 6.9 g/dL (ref 6.5–8.1)

## 2021-05-17 LAB — BRAIN NATRIURETIC PEPTIDE: B Natriuretic Peptide: 804.4 pg/mL — ABNORMAL HIGH (ref 0.0–100.0)

## 2021-05-17 LAB — PROTIME-INR
INR: 2.6 — ABNORMAL HIGH (ref 0.8–1.2)
Prothrombin Time: 27.4 seconds — ABNORMAL HIGH (ref 11.4–15.2)

## 2021-05-17 MED ORDER — ALBUTEROL SULFATE (2.5 MG/3ML) 0.083% IN NEBU
5.0000 mg | INHALATION_SOLUTION | Freq: Once | RESPIRATORY_TRACT | Status: AC
  Administered 2021-05-17: 5 mg via RESPIRATORY_TRACT
  Filled 2021-05-17: qty 6

## 2021-05-17 MED ORDER — FUROSEMIDE 40 MG PO TABS
80.0000 mg | ORAL_TABLET | Freq: Every morning | ORAL | 0 refills | Status: DC
Start: 2021-05-17 — End: 2021-07-06

## 2021-05-17 MED ORDER — FUROSEMIDE 10 MG/ML IJ SOLN
20.0000 mg | Freq: Once | INTRAMUSCULAR | Status: DC
Start: 2021-05-17 — End: 2021-05-17

## 2021-05-17 NOTE — ED Provider Notes (Signed)
?[facility] DEPT ?Provider Note ? ? ?CSN: [id number] ?Arrival date & time: 05/17/21  6503 ? ?  ? ?History ? ?No chief complaint on file. ? ? ?Tyler Williams is a 88 y.o. male. ? ?HPI ?Patient presents via EMS after a fall.  Patient was sitting at his table, drink coffee, he reportedly fell.  He has no complaints, but is wearing supplemental oxygen, receiving a bronchodilator.  He acknowledges multiple medical problems, states that he takes medication regularly.  Notably, the patient had a pause in his Coumadin dosing, and has not taken this in 3 weeks.  EMS reports the patient was dyspneic, tachypneic on arrival, improved with bronchodilator provided in route.  He also sustained a skin tear to his right forearm. ?  ? ?Home Medications ?Prior to Admission medications   ?Medication Sig Start Date End Date Taking? Authorizing Provider  ?aspirin EC 81 MG tablet Take 81 mg by mouth every morning.   Yes [provider]  ?atorvastatin (LIPITOR) 20 MG tablet TAKE 1 TABLET ONCE DAILY. ?Patient taking differently: Take 20 mg by mouth every evening. 12/30/20  Yes Sherren Mocha, MD  ?Calcium-Magnesium-Zinc (CAL-MAG-ZINC PO) Take 1 tablet by mouth every morning.   Yes [provider]  ?Cholecalciferol (VITAMIN D-3) 125 MCG (5000 UT) TABS Take 5,000 Units by mouth every morning.   Yes [provider]  ?fesoterodine (TOVIAZ) 8 MG TB24 tablet Take 8 mg by mouth every evening.   Yes [provider]  ?finasteride (PROSCAR) 5 MG tablet Take 1 tablet (5 mg total) by mouth daily. ?Patient taking differently: Take 5 mg by mouth every morning. 06/21/20  Yes Charlynne Cousins, MD  ?isosorbide mononitrate (IMDUR) 60 MG 24 hr tablet Take 3 tablets (180 mg total) by mouth daily. Please keep appointment in May 2023 for future refills. Thank you ?Patient taking differently: Take 120 mg by mouth every morning. Please keep appointment in May 2023 for future refills. Thank  you 03/31/21  Yes Burt Knack, Legrand Como, MD  ?LORazepam (ATIVAN) 1 MG tablet Take 1 mg by mouth every evening.   Yes [provider]  ?metoprolol succinate (TOPROL-XL) 25 MG 24 hr tablet TAKE 1 TABLET EACH DAY. ?Patient taking differently: 25 mg every morning. 09/20/20  Yes Sherren Mocha, MD  ?Multiple Vitamins-Minerals (PRESERVISION AREDS) TABS Take 2 tablets by mouth every morning. 03/04/09  Yes [provider]  ?nitroGLYCERIN (NITROSTAT) 0.4 MG SL tablet Place 0.4 mg under the tongue every 5 (five) minutes as needed for chest pain.   Yes [provider]  ?sacubitril-valsartan (ENTRESTO) 24-26 MG Take 1 tablet by mouth 2 (two) times daily. 07/22/20  Yes Sherren Mocha, MD  ?tamsulosin (FLOMAX) 0.4 MG CAPS capsule Take 1 capsule (0.4 mg total) by mouth daily. ?Patient taking differently: Take 0.4 mg by mouth every morning. 06/21/20  Yes Charlynne Cousins, MD  ?warfarin (COUMADIN) 5 MG tablet Take 0.5 tablets (2.5 mg total) by mouth daily. ?Patient taking differently: Take 5 mg by mouth every evening. 06/21/20  Yes Charlynne Cousins, MD  ?furosemide (LASIX) 40 MG tablet Take 2 tablets (80 mg total) by mouth every morning for 5 days. 05/17/21 05/22/21  Carmin Muskrat, MD  ?spironolactone (ALDACTONE) 25 MG tablet Take 1 tablet (25 mg total) by mouth daily. ?Patient not taking: Reported on 05/17/2021 06/28/20 04/22/21  Conrad [place], NP  ?   ? ?Allergies    ?Patient has no known allergies.   ? ?Review of Systems   ?Review of  Systems  ?Constitutional:   ?     Per HPI, otherwise negative  ?HENT:    ?     Per HPI, otherwise negative  ?Respiratory:    ?     Per HPI, otherwise negative  ?Cardiovascular:   ?     Per HPI, otherwise negative  ?Gastrointestinal:  Negative for vomiting.  ?Endocrine:  ?     Negative aside from HPI  ?Genitourinary:   ?     Neg aside from HPI   ?Musculoskeletal:   ?     Per HPI, otherwise negative  ?Skin: Negative.   ?Neurological:  Negative for syncope.  ? ?Physical Exam ?Updated  Vital Signs ?BP (!) 111/47   Pulse 74   Temp 98.1 ?F (36.7 ?C) (Oral)   Resp (!) 22   SpO2 98%  ?Physical Exam ?Vitals and nursing note reviewed.  ?Constitutional:   ?   General: He is not in acute distress. ?   Appearance: He is well-developed.  ?HENT:  ?   Head: Normocephalic and atraumatic.  ?Eyes:  ?   Conjunctiva/sclera: Conjunctivae normal.  ?Cardiovascular:  ?   Rate and Rhythm: Regular rhythm. Tachycardia present.  ?Pulmonary:  ?   Effort: No respiratory distress.  ?   Breath sounds: No stridor. Wheezing present.  ?Abdominal:  ?   General: There is no distension.  ?Musculoskeletal:     ?   General: No deformity.  ?   Comments: Moves all extremities spontaneously flexes each hip independently and to command, denies any physical pain in his extremities.  ?Skin: ?   General: Skin is warm and dry.  ?   Comments: Skin tear right forearm   ?Neurological:  ?   Mental Status: He is alert and oriented to person, place, and time.  ? ? ?ED Results / Procedures / Treatments   ?Labs ?(all labs ordered are listed, but only abnormal results are displayed) ?Labs Reviewed  ?COMPREHENSIVE METABOLIC PANEL - Abnormal; Notable for the following components:  ?    Result Value  ? Glucose, Bld 116 (*)   ? BUN 31 (*)   ? Calcium 8.8 (*)   ? Albumin 3.3 (*)   ? Total Bilirubin 1.4 (*)   ? All other components within normal limits  ?CBC WITH DIFFERENTIAL/PLATELET - Abnormal; Notable for the following components:  ? RBC 3.34 (*)   ? Hemoglobin 10.0 (*)   ? HCT 31.6 (*)   ? RDW 17.2 (*)   ? Platelets 127 (*)   ? Lymphs Abs 0.1 (*)   ? All other components within normal limits  ?BRAIN NATRIURETIC PEPTIDE - Abnormal; Notable for the following components:  ? B Natriuretic Peptide 804.4 (*)   ? All other components within normal limits  ?PROTIME-INR - Abnormal; Notable for the following components:  ? Prothrombin Time 27.4 (*)   ? INR 2.6 (*)   ? All other components within normal limits  ? ? ?EKG ?EKG  Interpretation ? ?Date/Time:  Tuesday May 17 2021 09:33:18 EDT ?Ventricular Rate:  79 ?PR Interval:    ?QRS Duration: 155 ?QT Interval:  445 ?QTC Calculation: 511 ?R Axis:   108 ?Text Interpretation: Atrial fibrillation Ventricular premature complex Right bundle branch block Baseline wander in lead(s) V2 Abnormal ECG Confirmed by Carmin Muskrat [phone]) on 05/17/2021 11:13:04 AM ? ?Radiology ?DG Chest Port 1 View ? ?Result Date: 05/17/2021 ?CLINICAL DATA:  Shortness of breath EXAM: PORTABLE CHEST 1 VIEW COMPARISON:  04/27/2021 FINDINGS: Stable cardiomegaly status  post TAVR. Aortic atherosclerosis. Streaky bibasilar opacities, likely atelectasis. No pleural effusion or pneumothorax. IMPRESSION: Streaky bibasilar opacities, likely atelectasis. Electronically Signed   By: Davina Poke D.O.   On: 05/17/2021 10:42   ? ?Procedures ?Procedures  ? ? ?Medications Ordered in ED ?Medications  ?furosemide (LASIX) injection 20 mg (has no administration in time range)  ?albuterol (PROVENTIL) (2.5 MG/3ML) 0.083% nebulizer solution 5 mg (5 mg Nebulization Given 05/17/21 1048)  ? ? ?ED Course/ Medical Decision Making/ A&P ?This patient with a Hx of multiple medical issues including heart failure, A-fib presents to the ED for concern of wheezing, fall, this involves an extensive number of treatment options, and is a complaint that carries with it a high risk of complications and morbidity.   ? ?The differential diagnosis includes heart failure exacerbation, COPD exacerbation, as well as posttraumatic effects ? ? ?Social Determinants of Health: ? ?, Multiple medical issues ? ?Additional history obtained: ? ?Additional history and/or information obtained from EMS as above, notable for ongoing respiratory therapy ? ? ?After the initial evaluation, orders, including: Bronchodilators, x-ray, labs, ECG were initiated. ? ? ?Patient placed on Cardiac and Pulse-Oximetry Monitors ?The patient was maintained on a cardiac monitor.  The cardiac  monitored showed an rhythm of 75 sinus normal ?The patient was also maintained on pulse oximetry. The readings were typically 99% with nonrebreather mask receiving therapy abnormal ? ? ?On repeat evaluation of the patient improved ? ?Lab Tests:

## 2021-05-17 NOTE — Discharge Instructions (Signed)
As discussed, with today's evaluation for shortness of breath and your fall is important to follow-up with your primary care physician.  For the next 5 days please take your increased dose of Lasix.  You may then return to your normal dosing pattern.  Please discuss this with your physician. ? ? ?Your wound care devices should be removed in 7 days.  Return here for concerning changes in your condition. ?

## 2021-05-17 NOTE — ED Triage Notes (Addendum)
Patient coming from [facility]  with c/o a fall. Patient has skin tear on his right forearm. Patient was given 10 mg of albuterol and 0.5 Atrovent 125 mg of solumedrol. ?

## 2021-05-18 ENCOUNTER — Ambulatory Visit: Payer: Medicare Other | Admitting: Podiatry

## 2021-05-18 ENCOUNTER — Telehealth: Payer: Self-pay | Admitting: Cardiovascular Disease

## 2021-05-18 NOTE — Telephone Encounter (Signed)
? ?  Pt's daughter calling, she wanted to let Dr. Burt Knack know that pt was in the ED twice this month 04/01 and 04/04. Also, pt's BMP is at 804. She wanted to check in if there's any recommendation from Dr. Burt Knack  ?

## 2021-05-18 NOTE — Telephone Encounter (Signed)
Thx. I'll take a look at the labs when we get them.  ?

## 2021-05-18 NOTE — Telephone Encounter (Signed)
I spoke with the pts daughter.... she reports that the pt has been to the ED twice recently due to having a scratch from a CNA at [facility] that led to bleeding and another slipping out of his chair at home and having another abrasion requiring a dressing... she wants to let Dr. Burt Knack know that he had labs done at his last ED visit yesterday 4/4 and his BNP was elevated.. 804.  ? ?He has had his typical bilateral lower extremity edema relieved with compression stockings and elevation... he continues to have increased NA intake... but he is not having any added SOB with exertion. ? ?The ED MD had ordered a 5 day increase of  lasix from 40 mg to 80 mg for 5 days then he has a follow up with his PCP Dr. Reynaldo Minium... he has labs planned for that visit.  ? ?He is seeing Dr. Burt Knack in May 2023 with an Echo same day.  ? ?I asked her to have the labs sent to Dr. Burt Knack for his review.  ? ?I will forward to Dr.Cooper per her request for any further recommendations.  ? ? ?

## 2021-05-19 DIAGNOSIS — Z7901 Long term (current) use of anticoagulants: Secondary | ICD-10-CM | POA: Diagnosis not present

## 2021-05-19 DIAGNOSIS — H353231 Exudative age-related macular degeneration, bilateral, with active choroidal neovascularization: Secondary | ICD-10-CM | POA: Diagnosis not present

## 2021-05-19 DIAGNOSIS — Z961 Presence of intraocular lens: Secondary | ICD-10-CM | POA: Diagnosis not present

## 2021-05-24 DIAGNOSIS — I509 Heart failure, unspecified: Secondary | ICD-10-CM | POA: Diagnosis not present

## 2021-05-24 DIAGNOSIS — S41111A Laceration without foreign body of right upper arm, initial encounter: Secondary | ICD-10-CM | POA: Diagnosis not present

## 2021-05-24 DIAGNOSIS — Z7901 Long term (current) use of anticoagulants: Secondary | ICD-10-CM | POA: Diagnosis not present

## 2021-05-24 DIAGNOSIS — R609 Edema, unspecified: Secondary | ICD-10-CM | POA: Diagnosis not present

## 2021-05-24 DIAGNOSIS — I482 Chronic atrial fibrillation, unspecified: Secondary | ICD-10-CM | POA: Diagnosis not present

## 2021-05-30 DIAGNOSIS — Z9181 History of falling: Secondary | ICD-10-CM | POA: Diagnosis not present

## 2021-05-30 DIAGNOSIS — I482 Chronic atrial fibrillation, unspecified: Secondary | ICD-10-CM | POA: Diagnosis not present

## 2021-05-30 DIAGNOSIS — I2510 Atherosclerotic heart disease of native coronary artery without angina pectoris: Secondary | ICD-10-CM | POA: Diagnosis not present

## 2021-05-30 DIAGNOSIS — I110 Hypertensive heart disease with heart failure: Secondary | ICD-10-CM | POA: Diagnosis not present

## 2021-05-30 DIAGNOSIS — E785 Hyperlipidemia, unspecified: Secondary | ICD-10-CM | POA: Diagnosis not present

## 2021-05-30 DIAGNOSIS — Z7901 Long term (current) use of anticoagulants: Secondary | ICD-10-CM | POA: Diagnosis not present

## 2021-05-30 DIAGNOSIS — S51811D Laceration without foreign body of right forearm, subsequent encounter: Secondary | ICD-10-CM | POA: Diagnosis not present

## 2021-05-30 DIAGNOSIS — H3530 Unspecified macular degeneration: Secondary | ICD-10-CM | POA: Diagnosis not present

## 2021-05-30 DIAGNOSIS — I509 Heart failure, unspecified: Secondary | ICD-10-CM | POA: Diagnosis not present

## 2021-05-30 DIAGNOSIS — Z87440 Personal history of urinary (tract) infections: Secondary | ICD-10-CM | POA: Diagnosis not present

## 2021-05-30 DIAGNOSIS — K4090 Unilateral inguinal hernia, without obstruction or gangrene, not specified as recurrent: Secondary | ICD-10-CM | POA: Diagnosis not present

## 2021-05-30 DIAGNOSIS — S81811D Laceration without foreign body, right lower leg, subsequent encounter: Secondary | ICD-10-CM | POA: Diagnosis not present

## 2021-05-30 DIAGNOSIS — E783 Hyperchylomicronemia: Secondary | ICD-10-CM | POA: Diagnosis not present

## 2021-05-30 DIAGNOSIS — Z7982 Long term (current) use of aspirin: Secondary | ICD-10-CM | POA: Diagnosis not present

## 2021-05-30 DIAGNOSIS — S81812D Laceration without foreign body, left lower leg, subsequent encounter: Secondary | ICD-10-CM | POA: Diagnosis not present

## 2021-06-02 DIAGNOSIS — I509 Heart failure, unspecified: Secondary | ICD-10-CM | POA: Diagnosis not present

## 2021-06-02 DIAGNOSIS — I482 Chronic atrial fibrillation, unspecified: Secondary | ICD-10-CM | POA: Diagnosis not present

## 2021-06-02 DIAGNOSIS — I110 Hypertensive heart disease with heart failure: Secondary | ICD-10-CM | POA: Diagnosis not present

## 2021-06-02 DIAGNOSIS — S51811D Laceration without foreign body of right forearm, subsequent encounter: Secondary | ICD-10-CM | POA: Diagnosis not present

## 2021-06-02 DIAGNOSIS — S81811D Laceration without foreign body, right lower leg, subsequent encounter: Secondary | ICD-10-CM | POA: Diagnosis not present

## 2021-06-02 DIAGNOSIS — S81812D Laceration without foreign body, left lower leg, subsequent encounter: Secondary | ICD-10-CM | POA: Diagnosis not present

## 2021-06-06 DIAGNOSIS — I509 Heart failure, unspecified: Secondary | ICD-10-CM | POA: Diagnosis not present

## 2021-06-06 DIAGNOSIS — I110 Hypertensive heart disease with heart failure: Secondary | ICD-10-CM | POA: Diagnosis not present

## 2021-06-06 DIAGNOSIS — S81811D Laceration without foreign body, right lower leg, subsequent encounter: Secondary | ICD-10-CM | POA: Diagnosis not present

## 2021-06-06 DIAGNOSIS — S81812D Laceration without foreign body, left lower leg, subsequent encounter: Secondary | ICD-10-CM | POA: Diagnosis not present

## 2021-06-06 DIAGNOSIS — I482 Chronic atrial fibrillation, unspecified: Secondary | ICD-10-CM | POA: Diagnosis not present

## 2021-06-06 DIAGNOSIS — S51811D Laceration without foreign body of right forearm, subsequent encounter: Secondary | ICD-10-CM | POA: Diagnosis not present

## 2021-06-07 DIAGNOSIS — I509 Heart failure, unspecified: Secondary | ICD-10-CM | POA: Diagnosis not present

## 2021-06-07 DIAGNOSIS — I110 Hypertensive heart disease with heart failure: Secondary | ICD-10-CM | POA: Diagnosis not present

## 2021-06-07 DIAGNOSIS — S51811D Laceration without foreign body of right forearm, subsequent encounter: Secondary | ICD-10-CM | POA: Diagnosis not present

## 2021-06-07 DIAGNOSIS — S81811D Laceration without foreign body, right lower leg, subsequent encounter: Secondary | ICD-10-CM | POA: Diagnosis not present

## 2021-06-07 DIAGNOSIS — I482 Chronic atrial fibrillation, unspecified: Secondary | ICD-10-CM | POA: Diagnosis not present

## 2021-06-07 DIAGNOSIS — S81812D Laceration without foreign body, left lower leg, subsequent encounter: Secondary | ICD-10-CM | POA: Diagnosis not present

## 2021-06-08 DIAGNOSIS — S81812D Laceration without foreign body, left lower leg, subsequent encounter: Secondary | ICD-10-CM | POA: Diagnosis not present

## 2021-06-08 DIAGNOSIS — I482 Chronic atrial fibrillation, unspecified: Secondary | ICD-10-CM | POA: Diagnosis not present

## 2021-06-08 DIAGNOSIS — I110 Hypertensive heart disease with heart failure: Secondary | ICD-10-CM | POA: Diagnosis not present

## 2021-06-08 DIAGNOSIS — S81811D Laceration without foreign body, right lower leg, subsequent encounter: Secondary | ICD-10-CM | POA: Diagnosis not present

## 2021-06-08 DIAGNOSIS — I509 Heart failure, unspecified: Secondary | ICD-10-CM | POA: Diagnosis not present

## 2021-06-08 DIAGNOSIS — S51811D Laceration without foreign body of right forearm, subsequent encounter: Secondary | ICD-10-CM | POA: Diagnosis not present

## 2021-06-09 DIAGNOSIS — S81811D Laceration without foreign body, right lower leg, subsequent encounter: Secondary | ICD-10-CM | POA: Diagnosis not present

## 2021-06-09 DIAGNOSIS — I110 Hypertensive heart disease with heart failure: Secondary | ICD-10-CM | POA: Diagnosis not present

## 2021-06-09 DIAGNOSIS — S81812D Laceration without foreign body, left lower leg, subsequent encounter: Secondary | ICD-10-CM | POA: Diagnosis not present

## 2021-06-09 DIAGNOSIS — S51811D Laceration without foreign body of right forearm, subsequent encounter: Secondary | ICD-10-CM | POA: Diagnosis not present

## 2021-06-09 DIAGNOSIS — I482 Chronic atrial fibrillation, unspecified: Secondary | ICD-10-CM | POA: Diagnosis not present

## 2021-06-09 DIAGNOSIS — Z20822 Contact with and (suspected) exposure to covid-19: Secondary | ICD-10-CM | POA: Diagnosis not present

## 2021-06-09 DIAGNOSIS — I509 Heart failure, unspecified: Secondary | ICD-10-CM | POA: Diagnosis not present

## 2021-06-12 DIAGNOSIS — E785 Hyperlipidemia, unspecified: Secondary | ICD-10-CM | POA: Diagnosis not present

## 2021-06-12 DIAGNOSIS — N182 Chronic kidney disease, stage 2 (mild): Secondary | ICD-10-CM | POA: Diagnosis not present

## 2021-06-12 DIAGNOSIS — I509 Heart failure, unspecified: Secondary | ICD-10-CM | POA: Diagnosis not present

## 2021-06-12 DIAGNOSIS — I130 Hypertensive heart and chronic kidney disease with heart failure and stage 1 through stage 4 chronic kidney disease, or unspecified chronic kidney disease: Secondary | ICD-10-CM | POA: Diagnosis not present

## 2021-06-13 DIAGNOSIS — I110 Hypertensive heart disease with heart failure: Secondary | ICD-10-CM | POA: Diagnosis not present

## 2021-06-13 DIAGNOSIS — R338 Other retention of urine: Secondary | ICD-10-CM | POA: Diagnosis not present

## 2021-06-13 DIAGNOSIS — S81812D Laceration without foreign body, left lower leg, subsequent encounter: Secondary | ICD-10-CM | POA: Diagnosis not present

## 2021-06-13 DIAGNOSIS — S51811D Laceration without foreign body of right forearm, subsequent encounter: Secondary | ICD-10-CM | POA: Diagnosis not present

## 2021-06-13 DIAGNOSIS — I509 Heart failure, unspecified: Secondary | ICD-10-CM | POA: Diagnosis not present

## 2021-06-13 DIAGNOSIS — S81811D Laceration without foreign body, right lower leg, subsequent encounter: Secondary | ICD-10-CM | POA: Diagnosis not present

## 2021-06-13 DIAGNOSIS — I482 Chronic atrial fibrillation, unspecified: Secondary | ICD-10-CM | POA: Diagnosis not present

## 2021-06-14 ENCOUNTER — Other Ambulatory Visit: Payer: Self-pay | Admitting: Cardiovascular Disease

## 2021-06-14 DIAGNOSIS — S81812D Laceration without foreign body, left lower leg, subsequent encounter: Secondary | ICD-10-CM | POA: Diagnosis not present

## 2021-06-14 DIAGNOSIS — S81811D Laceration without foreign body, right lower leg, subsequent encounter: Secondary | ICD-10-CM | POA: Diagnosis not present

## 2021-06-14 DIAGNOSIS — I482 Chronic atrial fibrillation, unspecified: Secondary | ICD-10-CM | POA: Diagnosis not present

## 2021-06-14 DIAGNOSIS — S51811D Laceration without foreign body of right forearm, subsequent encounter: Secondary | ICD-10-CM | POA: Diagnosis not present

## 2021-06-14 DIAGNOSIS — I509 Heart failure, unspecified: Secondary | ICD-10-CM | POA: Diagnosis not present

## 2021-06-14 DIAGNOSIS — I110 Hypertensive heart disease with heart failure: Secondary | ICD-10-CM | POA: Diagnosis not present

## 2021-06-15 DIAGNOSIS — I482 Chronic atrial fibrillation, unspecified: Secondary | ICD-10-CM | POA: Diagnosis not present

## 2021-06-15 DIAGNOSIS — I509 Heart failure, unspecified: Secondary | ICD-10-CM | POA: Diagnosis not present

## 2021-06-15 DIAGNOSIS — S51811D Laceration without foreign body of right forearm, subsequent encounter: Secondary | ICD-10-CM | POA: Diagnosis not present

## 2021-06-15 DIAGNOSIS — S81811D Laceration without foreign body, right lower leg, subsequent encounter: Secondary | ICD-10-CM | POA: Diagnosis not present

## 2021-06-15 DIAGNOSIS — S81812D Laceration without foreign body, left lower leg, subsequent encounter: Secondary | ICD-10-CM | POA: Diagnosis not present

## 2021-06-15 DIAGNOSIS — I110 Hypertensive heart disease with heart failure: Secondary | ICD-10-CM | POA: Diagnosis not present

## 2021-06-16 DIAGNOSIS — I110 Hypertensive heart disease with heart failure: Secondary | ICD-10-CM | POA: Diagnosis not present

## 2021-06-16 DIAGNOSIS — I482 Chronic atrial fibrillation, unspecified: Secondary | ICD-10-CM | POA: Diagnosis not present

## 2021-06-16 DIAGNOSIS — S51811D Laceration without foreign body of right forearm, subsequent encounter: Secondary | ICD-10-CM | POA: Diagnosis not present

## 2021-06-16 DIAGNOSIS — S81811D Laceration without foreign body, right lower leg, subsequent encounter: Secondary | ICD-10-CM | POA: Diagnosis not present

## 2021-06-16 DIAGNOSIS — I509 Heart failure, unspecified: Secondary | ICD-10-CM | POA: Diagnosis not present

## 2021-06-16 DIAGNOSIS — S81812D Laceration without foreign body, left lower leg, subsequent encounter: Secondary | ICD-10-CM | POA: Diagnosis not present

## 2021-06-18 DIAGNOSIS — S51811D Laceration without foreign body of right forearm, subsequent encounter: Secondary | ICD-10-CM | POA: Diagnosis not present

## 2021-06-18 DIAGNOSIS — S81812D Laceration without foreign body, left lower leg, subsequent encounter: Secondary | ICD-10-CM | POA: Diagnosis not present

## 2021-06-18 DIAGNOSIS — S81811D Laceration without foreign body, right lower leg, subsequent encounter: Secondary | ICD-10-CM | POA: Diagnosis not present

## 2021-06-18 DIAGNOSIS — I509 Heart failure, unspecified: Secondary | ICD-10-CM | POA: Diagnosis not present

## 2021-06-18 DIAGNOSIS — I110 Hypertensive heart disease with heart failure: Secondary | ICD-10-CM | POA: Diagnosis not present

## 2021-06-18 DIAGNOSIS — I482 Chronic atrial fibrillation, unspecified: Secondary | ICD-10-CM | POA: Diagnosis not present

## 2021-06-20 DIAGNOSIS — S51811D Laceration without foreign body of right forearm, subsequent encounter: Secondary | ICD-10-CM | POA: Diagnosis not present

## 2021-06-20 DIAGNOSIS — S81811D Laceration without foreign body, right lower leg, subsequent encounter: Secondary | ICD-10-CM | POA: Diagnosis not present

## 2021-06-20 DIAGNOSIS — I110 Hypertensive heart disease with heart failure: Secondary | ICD-10-CM | POA: Diagnosis not present

## 2021-06-20 DIAGNOSIS — I509 Heart failure, unspecified: Secondary | ICD-10-CM | POA: Diagnosis not present

## 2021-06-20 DIAGNOSIS — I482 Chronic atrial fibrillation, unspecified: Secondary | ICD-10-CM | POA: Diagnosis not present

## 2021-06-20 DIAGNOSIS — S81812D Laceration without foreign body, left lower leg, subsequent encounter: Secondary | ICD-10-CM | POA: Diagnosis not present

## 2021-06-21 ENCOUNTER — Other Ambulatory Visit: Payer: Self-pay | Admitting: Cardiovascular Disease

## 2021-06-22 DIAGNOSIS — I110 Hypertensive heart disease with heart failure: Secondary | ICD-10-CM | POA: Diagnosis not present

## 2021-06-22 DIAGNOSIS — S81811D Laceration without foreign body, right lower leg, subsequent encounter: Secondary | ICD-10-CM | POA: Diagnosis not present

## 2021-06-22 DIAGNOSIS — S81812D Laceration without foreign body, left lower leg, subsequent encounter: Secondary | ICD-10-CM | POA: Diagnosis not present

## 2021-06-22 DIAGNOSIS — I482 Chronic atrial fibrillation, unspecified: Secondary | ICD-10-CM | POA: Diagnosis not present

## 2021-06-22 DIAGNOSIS — S51811D Laceration without foreign body of right forearm, subsequent encounter: Secondary | ICD-10-CM | POA: Diagnosis not present

## 2021-06-22 DIAGNOSIS — I509 Heart failure, unspecified: Secondary | ICD-10-CM | POA: Diagnosis not present

## 2021-06-23 DIAGNOSIS — I110 Hypertensive heart disease with heart failure: Secondary | ICD-10-CM | POA: Diagnosis not present

## 2021-06-23 DIAGNOSIS — I482 Chronic atrial fibrillation, unspecified: Secondary | ICD-10-CM | POA: Diagnosis not present

## 2021-06-23 DIAGNOSIS — S51811D Laceration without foreign body of right forearm, subsequent encounter: Secondary | ICD-10-CM | POA: Diagnosis not present

## 2021-06-23 DIAGNOSIS — I509 Heart failure, unspecified: Secondary | ICD-10-CM | POA: Diagnosis not present

## 2021-06-23 DIAGNOSIS — S81811D Laceration without foreign body, right lower leg, subsequent encounter: Secondary | ICD-10-CM | POA: Diagnosis not present

## 2021-06-23 DIAGNOSIS — S81812D Laceration without foreign body, left lower leg, subsequent encounter: Secondary | ICD-10-CM | POA: Diagnosis not present

## 2021-06-28 DIAGNOSIS — S81812D Laceration without foreign body, left lower leg, subsequent encounter: Secondary | ICD-10-CM | POA: Diagnosis not present

## 2021-06-28 DIAGNOSIS — I482 Chronic atrial fibrillation, unspecified: Secondary | ICD-10-CM | POA: Diagnosis not present

## 2021-06-28 DIAGNOSIS — I509 Heart failure, unspecified: Secondary | ICD-10-CM | POA: Diagnosis not present

## 2021-06-28 DIAGNOSIS — I110 Hypertensive heart disease with heart failure: Secondary | ICD-10-CM | POA: Diagnosis not present

## 2021-06-28 DIAGNOSIS — S81811D Laceration without foreign body, right lower leg, subsequent encounter: Secondary | ICD-10-CM | POA: Diagnosis not present

## 2021-06-28 DIAGNOSIS — S51811D Laceration without foreign body of right forearm, subsequent encounter: Secondary | ICD-10-CM | POA: Diagnosis not present

## 2021-06-29 ENCOUNTER — Telehealth: Payer: Self-pay | Admitting: Physician Assistant

## 2021-06-29 DIAGNOSIS — Z9181 History of falling: Secondary | ICD-10-CM | POA: Diagnosis not present

## 2021-06-29 DIAGNOSIS — S81812D Laceration without foreign body, left lower leg, subsequent encounter: Secondary | ICD-10-CM | POA: Diagnosis not present

## 2021-06-29 DIAGNOSIS — H3530 Unspecified macular degeneration: Secondary | ICD-10-CM | POA: Diagnosis not present

## 2021-06-29 DIAGNOSIS — Z7901 Long term (current) use of anticoagulants: Secondary | ICD-10-CM | POA: Diagnosis not present

## 2021-06-29 DIAGNOSIS — S81811D Laceration without foreign body, right lower leg, subsequent encounter: Secondary | ICD-10-CM | POA: Diagnosis not present

## 2021-06-29 DIAGNOSIS — Z7982 Long term (current) use of aspirin: Secondary | ICD-10-CM | POA: Diagnosis not present

## 2021-06-29 DIAGNOSIS — S51811D Laceration without foreign body of right forearm, subsequent encounter: Secondary | ICD-10-CM | POA: Diagnosis not present

## 2021-06-29 DIAGNOSIS — I482 Chronic atrial fibrillation, unspecified: Secondary | ICD-10-CM | POA: Diagnosis not present

## 2021-06-29 DIAGNOSIS — I2510 Atherosclerotic heart disease of native coronary artery without angina pectoris: Secondary | ICD-10-CM | POA: Diagnosis not present

## 2021-06-29 DIAGNOSIS — K4090 Unilateral inguinal hernia, without obstruction or gangrene, not specified as recurrent: Secondary | ICD-10-CM | POA: Diagnosis not present

## 2021-06-29 DIAGNOSIS — E785 Hyperlipidemia, unspecified: Secondary | ICD-10-CM | POA: Diagnosis not present

## 2021-06-29 DIAGNOSIS — I509 Heart failure, unspecified: Secondary | ICD-10-CM | POA: Diagnosis not present

## 2021-06-29 DIAGNOSIS — Z87440 Personal history of urinary (tract) infections: Secondary | ICD-10-CM | POA: Diagnosis not present

## 2021-06-29 DIAGNOSIS — I110 Hypertensive heart disease with heart failure: Secondary | ICD-10-CM | POA: Diagnosis not present

## 2021-06-29 NOTE — Telephone Encounter (Signed)
Paged by Bubba Hales at [facility] where Mr. Tyler Williams resides.  According to [facility], patient is weight has been stable, however he has worsening swelling up to his scrotum.  He is currently on 40 mg daily of Lasix, Entresto, Imdur, and metoprolol.  He is no longer on spironolactone for some reason.  Given the significantly worsening swelling, I asked the facility to increase Lasix to 40 mg twice daily for 5 days before going back to daily dosing thereafter.  He is scheduled to see Dr. Burt Knack next Wednesday, a basic metabolic panel should be obtained during the visit to reassess his renal function and electrolyte. ?

## 2021-06-30 DIAGNOSIS — I509 Heart failure, unspecified: Secondary | ICD-10-CM | POA: Diagnosis not present

## 2021-06-30 DIAGNOSIS — I110 Hypertensive heart disease with heart failure: Secondary | ICD-10-CM | POA: Diagnosis not present

## 2021-06-30 DIAGNOSIS — I482 Chronic atrial fibrillation, unspecified: Secondary | ICD-10-CM | POA: Diagnosis not present

## 2021-06-30 DIAGNOSIS — S51811D Laceration without foreign body of right forearm, subsequent encounter: Secondary | ICD-10-CM | POA: Diagnosis not present

## 2021-06-30 DIAGNOSIS — S81811D Laceration without foreign body, right lower leg, subsequent encounter: Secondary | ICD-10-CM | POA: Diagnosis not present

## 2021-06-30 DIAGNOSIS — S81812D Laceration without foreign body, left lower leg, subsequent encounter: Secondary | ICD-10-CM | POA: Diagnosis not present

## 2021-06-30 NOTE — Telephone Encounter (Signed)
Thx. Agree with plan. Please check with facility 5/19 to see if he has responded to increased lasix. If not, may need to increase to 80 mg BID over the weekend x 3 days.

## 2021-07-01 NOTE — Telephone Encounter (Signed)
Spoke with Bubba Hales who states that she has not seen the patient today. She states that yesterday he seemed to be doing a little bit better. She is going to check on him later today. She has not heard from any of the aides today who will typically reach out with any concerns. She will let us know if there has not been any improvement.

## 2021-07-03 ENCOUNTER — Other Ambulatory Visit: Payer: Self-pay | Admitting: Cardiovascular Disease

## 2021-07-04 DIAGNOSIS — I110 Hypertensive heart disease with heart failure: Secondary | ICD-10-CM | POA: Diagnosis not present

## 2021-07-04 DIAGNOSIS — I509 Heart failure, unspecified: Secondary | ICD-10-CM | POA: Diagnosis not present

## 2021-07-04 DIAGNOSIS — S81811D Laceration without foreign body, right lower leg, subsequent encounter: Secondary | ICD-10-CM | POA: Diagnosis not present

## 2021-07-04 DIAGNOSIS — S81812D Laceration without foreign body, left lower leg, subsequent encounter: Secondary | ICD-10-CM | POA: Diagnosis not present

## 2021-07-04 DIAGNOSIS — S51811D Laceration without foreign body of right forearm, subsequent encounter: Secondary | ICD-10-CM | POA: Diagnosis not present

## 2021-07-04 DIAGNOSIS — I482 Chronic atrial fibrillation, unspecified: Secondary | ICD-10-CM | POA: Diagnosis not present

## 2021-07-05 DIAGNOSIS — I509 Heart failure, unspecified: Secondary | ICD-10-CM | POA: Diagnosis not present

## 2021-07-05 DIAGNOSIS — I110 Hypertensive heart disease with heart failure: Secondary | ICD-10-CM | POA: Diagnosis not present

## 2021-07-05 DIAGNOSIS — S81812D Laceration without foreign body, left lower leg, subsequent encounter: Secondary | ICD-10-CM | POA: Diagnosis not present

## 2021-07-05 DIAGNOSIS — I482 Chronic atrial fibrillation, unspecified: Secondary | ICD-10-CM | POA: Diagnosis not present

## 2021-07-05 DIAGNOSIS — S51811D Laceration without foreign body of right forearm, subsequent encounter: Secondary | ICD-10-CM | POA: Diagnosis not present

## 2021-07-05 DIAGNOSIS — S81811D Laceration without foreign body, right lower leg, subsequent encounter: Secondary | ICD-10-CM | POA: Diagnosis not present

## 2021-07-06 ENCOUNTER — Encounter: Payer: Self-pay | Admitting: Cardiovascular Disease

## 2021-07-06 ENCOUNTER — Ambulatory Visit (HOSPITAL_COMMUNITY): Payer: Medicare Other | Attending: Cardiovascular Disease

## 2021-07-06 ENCOUNTER — Ambulatory Visit (INDEPENDENT_AMBULATORY_CARE_PROVIDER_SITE_OTHER): Payer: Medicare Other | Admitting: Cardiovascular Disease

## 2021-07-06 VITALS — BP 130/60 | HR 67 | Ht 68.0 in | Wt 182.4 lb

## 2021-07-06 DIAGNOSIS — I5042 Chronic combined systolic (congestive) and diastolic (congestive) heart failure: Secondary | ICD-10-CM

## 2021-07-06 DIAGNOSIS — I2510 Atherosclerotic heart disease of native coronary artery without angina pectoris: Secondary | ICD-10-CM

## 2021-07-06 DIAGNOSIS — I4821 Permanent atrial fibrillation: Secondary | ICD-10-CM

## 2021-07-06 DIAGNOSIS — I359 Nonrheumatic aortic valve disorder, unspecified: Secondary | ICD-10-CM

## 2021-07-06 DIAGNOSIS — I10 Essential (primary) hypertension: Secondary | ICD-10-CM

## 2021-07-06 DIAGNOSIS — I5022 Chronic systolic (congestive) heart failure: Secondary | ICD-10-CM

## 2021-07-06 DIAGNOSIS — E782 Mixed hyperlipidemia: Secondary | ICD-10-CM | POA: Diagnosis not present

## 2021-07-06 LAB — ECHOCARDIOGRAM COMPLETE
AR max vel: 1.45 cm2
AV Area VTI: 1.4 cm2
AV Area mean vel: 1.36 cm2
AV Mean grad: 6 mmHg
AV Peak grad: 10.8 mmHg
Ao pk vel: 1.65 m/s
Area-P 1/2: 4.96 cm2
MV M vel: 5.54 m/s
MV Peak grad: 122.8 mmHg
S' Lateral: 2.55 cm

## 2021-07-06 MED ORDER — FUROSEMIDE 40 MG PO TABS: 80.0000 mg | ORAL_TABLET | Freq: Every day | ORAL | 3 refills | Status: DC

## 2021-07-06 NOTE — Progress Notes (Signed)
Cardiology Office Note:    Date:  07/06/2021   ID:  Tyler Williams, DOB [birth year], MRN [id number]  PCP:  Burnard Bunting, MD   [facility] Providers Cardiologist:  Sherren Mocha, MD     Referring MD: Burnard Bunting, MD   Chief Complaint  Patient presents with   Leg Swelling    History of Present Illness:    Tyler Williams is a 89 y.o. male with a hx of: Permanent atrial fibrillation Coumadin anticoagulation Coronary artery disease S/p POBA in the 1980s, 1990s Known high-grade stenosis of mid RCA; unsuccessful attempted PCI in past Cath 7/19: Mid RCA, 85 >>unsuccessful PCI attempt to the RCA HFmrEF (heart failure with mildly reduced ejection fraction)  Echo 4/22: EF 45-50 with inferior and inferolateral HK, GRII DD Peripheral arterial disease AAA s/p repair Aortic stenosis S/p TAVR 03/4233 Chronic diastolic CHF Venous insufficiency/chronic leg edema Aortic atherosclerosis History of urinary retention  The patient is here with his son today.  He is in the process of transitioning from the independent portion of [facility] into the assisted living facility.  He has had increasing problems with leg swelling and scrotal swelling.  He denies orthopnea, PND, or shortness of breath at present.  No recent chest pain.  The patient has experienced a few falls but has not had any head injuries.  Past Medical History:  Diagnosis Date   A-fib [facility])    AAA (abdominal aortic aneurysm) ([place])    s/p repair   Aortic stenosis, mild    a. mean gradient 17 mmHg on 04/2012 TTE   Arthritis    knees   CAD (coronary artery disease)    a. s/p multiple percutaneous prior coronary artery interventions b. 05/2012: unsuccessful PCI to tight RCA->medically managed   Cancer ([place])    skin cancer removed, right hand, left leg, chest   Carotid artery disease (HCC)    0-39% bilateral ICA stenoses 11/2011   Chronic anticoagulation    Coumadin   Coronary artery disease    GERD  (gastroesophageal reflux disease)    GERD (gastroesophageal reflux disease)    History of pulmonary embolism    1964 related to dislocated hip on right    History of skin cancer    Hypercholesteremia    Hypertension    Incidental cecal mass noted on CT imaging 05/24/2016   **An incidental finding of potential clinical significance has been found. 4.6 x 5.6 x 3.7 cm masslike lesion in the cecum adjacent to the ileocecal valve may represent a large polyp or colonic neoplasm. 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 Unable to pass wire  past RCA lesion->medically managed   CHOLECYSTECTOMY N/A 12/05/2012   Procedure: LAPAROSCOPIC CHOLECYSTECTOMY ;  Surgeon: Edward Jolly, MD;  Location: [place];  Service: General;  Laterality: N/A;   [place] N/A 08/31/2017   Procedure: CORONARY BALLOON ANGIOPLASTY;  Surgeon: Martinique, Peter M, MD;  Location: [place] [facility];  Service: Cardiovascular;  Laterality: N/A;   EYE SURGERY     hx of cataract surgery    [place]     right  inguinal hernia repair    JOINT REPLACEMENT     left knee replacement, partial right   LEFT HEART CATH AND CORONARY ANGIOGRAPHY N/A 08/31/2017   Procedure: LEFT HEART CATH AND CORONARY ANGIOGRAPHY;  Surgeon: Martinique, Peter M, MD;  Location: [place] [facility];  Service: Cardiovascular;  Laterality: N/A;   LEFT HEART CATHETERIZATION WITH CORONARY ANGIOGRAM N/A 05/24/2012   Procedure: LEFT HEART CATHETERIZATION WITH CORONARY ANGIOGRAM;  Surgeon: Burnell Blanks, MD;  Location: [facility] CATH LAB;  Service: Cardiovascular;  Laterality: N/A;   ORTHOPEDIC SURGERY     OTHER SURGICAL HISTORY     right knee popliteal aneurysm surgery stent placed    OTHER SURGICAL HISTORY     PARTIAL KNEE ARTHROPLASTY  01/22/2012   Procedure: UNICOMPARTMENTAL KNEE;  Surgeon: Mauri Pole, MD;  Location: [facility];  Service: Orthopedics;  Laterality: Right;   PERCUTANEOUS CORONARY INTERVENTION-BALLOON ONLY  05/24/2012   Procedure: PERCUTANEOUS CORONARY INTERVENTION-BALLOON ONLY;  Surgeon: Burnell Blanks, MD;  Location: [facility] CATH LAB;  Service: Cardiovascular;;   RIGHT/LEFT HEART CATH AND CORONARY ANGIOGRAPHY N/A 05/12/2016   Procedure: Right/Left Heart Cath and Coronary Angiography;  Surgeon: Sherren Mocha, MD;  Location: [place] [facility];  Service: Cardiovascular;  Laterality: N/A;   TEE WITHOUT CARDIOVERSION N/A 06/13/2016   Procedure: TRANSESOPHAGEAL ECHOCARDIOGRAM (TEE);  Surgeon: Sherren Mocha, MD;  Location: [place];  Service: Open Heart Surgery;  Laterality: N/A;   TONSILLECTOMY     TOTAL KNEE ARTHROPLASTY Left    TRANSCATHETER AORTIC VALVE REPLACEMENT, TRANSFEMORAL N/A 06/13/2016   Procedure: TRANSCATHETER AORTIC VALVE REPLACEMENT, TRANSFEMORAL;  Surgeon: Sherren Mocha, MD;  Location: [place];  Service: Open Heart Surgery;  Laterality: N/A;    Current Medications: Current Meds  Medication Sig   aspirin EC 81 MG tablet Take 81 mg by mouth every morning.   atorvastatin (LIPITOR) 20 MG tablet TAKE 1 TABLET  ONCE DAILY. (Patient taking differently: Take 20 mg by mouth every evening.)   Calcium-Magnesium-Zinc (CAL-MAG-ZINC PO) Take 1 tablet by mouth every morning.   furosemide (LASIX) 40 MG tablet Take 2 tablets (80 mg total) by mouth daily.     Allergies:   Patient has no known allergies.   Social History   Socioeconomic History   Marital status: Widowed    Spouse name: Not on file   Number of children: Not on file   Years of education: Not on file   Highest education level: Not on file  Occupational History   Not on file  Tobacco Use   Smoking status: Never   Smokeless tobacco: Never  Vaping Use   Vaping Use: Never used  Substance and Sexual Activity   Alcohol use: Not Currently    Comment: patient doesn't drink anymore   Drug use: Never   Sexual activity: Not on file  Other Topics Concern   Not on file  Social History  Narrative   ** Merged History Encounter **       [employer]; played football; score touchdown against [place] Determinants of Health   Financial Resource Strain: Not on file  Food Insecurity: Not on file  Transportation Needs: Not on file  Physical Activity: Not on file  Stress: Not on file  Social Connections: Not on file     Family History: The patient's family history includes Heart attack (age of onset: 34) in his mother.  ROS:   Please see the history of present illness.    All other systems reviewed and are negative.  EKGs/Labs/Other Studies Reviewed:    The following studies were reviewed today: Echo 06/12/20: 1. Left ventricular ejection fraction, by estimation, is 45 to 50%. The  left ventricle has mildly decreased function. The left ventricle  demonstrates regional wall motion abnormalities (see scoring  diagram/findings for description). There is mild  concentric left ventricular hypertrophy. Left ventricular diastolic  parameters are consistent with Grade II diastolic dysfunction  (pseudonormalization). Elevated left  atrial pressure. There is moderate  hypokinesis of the left ventricular, basal-mid  inferior wall and inferolateral wall.   2. Right ventricular systolic function is mildly reduced. The right  ventricular size is normal. There is mildly elevated pulmonary artery  systolic pressure.   3. Left atrial size was severely dilated.   4. Right atrial size was moderately dilated.   5. The mitral valve is normal in structure. Mild to moderate mitral valve  regurgitation.   6. The aortic valve has been repaired/replaced. Aortic valve  regurgitation is trivial. No aortic stenosis is present. There is a 29 mm  Sapien prosthetic (TAVR) valve present in the aortic position. Aortic  valve mean gradient measures 6.0 mmHg. Aortic  valve Vmax measures 1.82 m/s.   7. The inferior vena cava is dilated in size with <50% respiratory  variability, suggesting right atrial pressure of 15 mmHg.   Comparison(s): Prior images unable to be directly viewed, comparison made  by report only. The left ventricular function is worsened. The left  ventricular wall motion abnormality is unchanged.   FINDINGS   Left Ventricle: Left ventricular ejection fraction, by estimation, is 45  to 50%. The left ventricle has mildly decreased function. The left  ventricle demonstrates regional wall motion abnormalities. Moderate  hypokinesis of the left ventricular,  basal-mid inferior wall and inferolateral wall. The left ventricular  internal cavity size was normal in size. There is mild concentric left  ventricular hypertrophy. Abnormal (paradoxical) septal motion consistent  with post-operative status. Left  ventricular diastolic parameters are consistent with Grade II diastolic  dysfunction (pseudonormalization). Elevated left atrial pressure.   Right Ventricle: The right ventricular size is normal. No increase in  right ventricular wall thickness. Right ventricular systolic function is  mildly reduced. There is mildly  elevated pulmonary artery systolic  pressure. The tricuspid regurgitant velocity   is 2.56 m/s, and with an assumed right atrial pressure of 15 mmHg, the  estimated right ventricular systolic pressure is 03.4 mmHg.   Left Atrium: Left atrial size was severely dilated.   Right Atrium: Right atrial size was moderately dilated.   Pericardium: There is no evidence of pericardial effusion.   Mitral Valve: The mitral valve is normal in structure. Mild to moderate  mitral annular calcification. Mild to moderate mitral valve regurgitation,  with centrally-directed jet.   Tricuspid Valve: The tricuspid valve is normal in structure. Tricuspid  valve regurgitation is mild.  Aortic Valve: The aortic valve has been repaired/replaced. Aortic valve  regurgitation is trivial. Aortic regurgitation PHT measures 422 msec. No  aortic stenosis is present. Aortic valve mean gradient measures 6.0 mmHg.  Aortic valve peak gradient measures   13.2 mmHg. Aortic valve area, by VTI measures 1.31 cm. There is a 29 mm  Sapien prosthetic, stented (TAVR) valve present in the aortic position.   Pulmonic Valve: The pulmonic valve was normal in structure. Pulmonic valve  regurgitation is not visualized.   Aorta: The aortic root and ascending aorta are structurally normal, with  no evidence of dilitation.   Venous: The inferior vena cava is dilated in size with less than 50%  respiratory variability, suggesting right atrial pressure of 15 mmHg.   IAS/Shunts: No atrial level shunt detected by color flow Doppler.   EKG:  EKG is not ordered today.   Recent Labs: 05/17/2021: ALT 15; B Natriuretic Peptide 804.4; BUN 31; Creatinine, Ser 0.84; Hemoglobin 10.0; Platelets 127; Potassium 3.8; Sodium 139  Recent Lipid Panel    Component Value Date/Time   CHOL 149 08/28/2017 0350   TRIG 95 08/28/2017 0350   HDL 52 08/28/2017 0350   CHOLHDL 2.9 08/28/2017 0350   VLDL 19 08/28/2017 0350   LDLCALC 78 08/28/2017 0350      Risk Assessment/Calculations:    CHA2DS2-VASc Score = 5   This indicates a 7.2% annual risk of stroke. The patient's score is based upon: CHF History: 1 HTN History: 1 Diabetes History: 0 Stroke History: 0 Vascular Disease History: 1 Age Score: 2 Gender Score: 0          Physical Exam:    VS:  BP 130/60   Pulse 67   Ht '5\' 8"'$  (1.727 m)   Wt 182 lb 6.4 oz (82.7 kg)   SpO2 97%   BMI 27.73 kg/m     Wt Readings from Last 3 Encounters:  07/06/21 182 lb 6.4 oz (82.7 kg)  05/14/21 175 lb (79.4 kg)  04/22/21 179 lb 12.8 oz (81.6 kg)     GEN: Pleasant elderly male in no acute distress HEENT: Normal NECK: JVP is elevated with prominent V waves; No carotid bruits LYMPHATICS: No lymphadenopathy CARDIAC: Irregularly irregular, no murmurs, rubs, gallops RESPIRATORY:  Clear to auscultation without rales, wheezing or rhonchi  ABDOMEN: Soft, non-tender, non-distended GU: scrotal edema noted MUSCULOSKELETAL: 2+ bilateral pretibial edema; No deformity  SKIN: Warm and dry NEUROLOGIC:  Alert and oriented x 3 PSYCHIATRIC:  Normal affect   ASSESSMENT:    1. Permanent atrial fibrillation ([place])   2. Chronic combined systolic and diastolic heart failure ([place])   3. Coronary artery disease involving native coronary artery of native heart without angina pectoris   4. Essential hypertension   5. Mixed hyperlipidemia   6. Aortic valve disease    PLAN:    In order of problems listed above:  Continue metoprolol succinate.  Continue warfarin for anticoagulation.  Discussed pros and cons of anticoagulation in this elderly patient with increased fall risk.  He is at high stroke risk with a CHADS-Vasc score of 5 and will continue on anticoagulation for now. He understands we will continue to discuss risk/benefit of anticoagulation in the future.  Today's echo is reviewed and shows stable, mild LV dysfunction with normal function of the TAVR prosthesis. He is volume overloaded on exam.  He will increase furosemide to 80 mg daily.  No angina. Continue current Rx.  BP well-controlled. Increase furosemide.  Continue atorvastatin.  Last  LDL cholesterol is 69 mg/dL.  ALT is 15. Normal function of his TAVR bioprosthesis on today's echo.  I personally reviewed the images.  There is a trivial paravalvular leak that is not clinically significant.           Medication Adjustments/Labs and Tests Ordered: Current medicines are reviewed at length with the patient today.  Concerns regarding medicines are outlined above.  No orders of the defined types were placed in this encounter.  Meds ordered this encounter  Medications   furosemide (LASIX) 40 MG tablet    Sig: Take 2 tablets (80 mg total) by mouth daily.    Dispense:  180 tablet    Refill:  3    Dose INCREASE    Patient Instructions  Medication Instructions:  INCREASE Furosemide to '80mg'$  daily *If you need a refill on your cardiac medications before your next appointment, please call your pharmacy*   Lab Work: NONE If you have labs (blood work) drawn today and your tests are completely normal, you will receive your results only by: [place] (if you have MyChart) OR A paper copy in the mail If you have any lab test that is abnormal or we need to change your treatment, we will call you to review the results.   Testing/Procedures: NONE   Follow-Up: At [facility], you and your health needs are our priority.  As part of our continuing mission to provide you with exceptional heart care, we have created designated Provider Care Teams.  These Care Teams include your primary Cardiologist (physician) and Advanced Practice Providers (APPs -  Physician Assistants and Nurse Practitioners) who all work together to provide you with the care you need, when you need it.   Your next appointment:   3 month(s)  The format for your next appointment:   In Person  Provider:   Christen Bame, NP or [name], PA.  Then, Dr Burt Knack will plan to see you in 1 year.      Important Information About Sugar         Signed, Sherren Mocha, MD  07/06/2021 5:57 PM    [place]

## 2021-07-06 NOTE — Patient Instructions (Addendum)
Medication Instructions:  INCREASE Furosemide to '80mg'$  daily *If you need a refill on your cardiac medications before your next appointment, please call your pharmacy*   Lab Work: NONE If you have labs (blood work) drawn today and your tests are completely normal, you will receive your results only by: [place] (if you have MyChart) OR A paper copy in the mail If you have any lab test that is abnormal or we need to change your treatment, we will call you to review the results.   Testing/Procedures: NONE   Follow-Up: At [facility], you and your health needs are our priority.  As part of our continuing mission to provide you with exceptional heart care, we have created designated Provider Care Teams.  These Care Teams include your primary Cardiologist (physician) and Advanced Practice Providers (APPs -  Physician Assistants and Nurse Practitioners) who all work together to provide you with the care you need, when you need it.   Your next appointment:   3 month(s)  The format for your next appointment:   In Person  Provider:   Christen Bame, NP or [name], PA. Then, Dr Burt Knack will plan to see you in 1 year.      Important Information About Sugar

## 2021-07-07 DIAGNOSIS — I110 Hypertensive heart disease with heart failure: Secondary | ICD-10-CM | POA: Diagnosis not present

## 2021-07-07 DIAGNOSIS — I509 Heart failure, unspecified: Secondary | ICD-10-CM | POA: Diagnosis not present

## 2021-07-07 DIAGNOSIS — Z111 Encounter for screening for respiratory tuberculosis: Secondary | ICD-10-CM | POA: Diagnosis not present

## 2021-07-07 DIAGNOSIS — I482 Chronic atrial fibrillation, unspecified: Secondary | ICD-10-CM | POA: Diagnosis not present

## 2021-07-07 DIAGNOSIS — S51811D Laceration without foreign body of right forearm, subsequent encounter: Secondary | ICD-10-CM | POA: Diagnosis not present

## 2021-07-07 DIAGNOSIS — S81812D Laceration without foreign body, left lower leg, subsequent encounter: Secondary | ICD-10-CM | POA: Diagnosis not present

## 2021-07-07 DIAGNOSIS — S81811D Laceration without foreign body, right lower leg, subsequent encounter: Secondary | ICD-10-CM | POA: Diagnosis not present

## 2021-07-08 DIAGNOSIS — S81811D Laceration without foreign body, right lower leg, subsequent encounter: Secondary | ICD-10-CM | POA: Diagnosis not present

## 2021-07-08 DIAGNOSIS — I509 Heart failure, unspecified: Secondary | ICD-10-CM | POA: Diagnosis not present

## 2021-07-08 DIAGNOSIS — I110 Hypertensive heart disease with heart failure: Secondary | ICD-10-CM | POA: Diagnosis not present

## 2021-07-08 DIAGNOSIS — I482 Chronic atrial fibrillation, unspecified: Secondary | ICD-10-CM | POA: Diagnosis not present

## 2021-07-08 DIAGNOSIS — S81812D Laceration without foreign body, left lower leg, subsequent encounter: Secondary | ICD-10-CM | POA: Diagnosis not present

## 2021-07-08 DIAGNOSIS — S51811D Laceration without foreign body of right forearm, subsequent encounter: Secondary | ICD-10-CM | POA: Diagnosis not present

## 2021-07-09 DIAGNOSIS — Z111 Encounter for screening for respiratory tuberculosis: Secondary | ICD-10-CM | POA: Diagnosis not present

## 2021-07-12 DIAGNOSIS — I110 Hypertensive heart disease with heart failure: Secondary | ICD-10-CM | POA: Diagnosis not present

## 2021-07-12 DIAGNOSIS — S81811D Laceration without foreign body, right lower leg, subsequent encounter: Secondary | ICD-10-CM | POA: Diagnosis not present

## 2021-07-12 DIAGNOSIS — I482 Chronic atrial fibrillation, unspecified: Secondary | ICD-10-CM | POA: Diagnosis not present

## 2021-07-12 DIAGNOSIS — I509 Heart failure, unspecified: Secondary | ICD-10-CM | POA: Diagnosis not present

## 2021-07-12 DIAGNOSIS — S81812D Laceration without foreign body, left lower leg, subsequent encounter: Secondary | ICD-10-CM | POA: Diagnosis not present

## 2021-07-12 DIAGNOSIS — S51811D Laceration without foreign body of right forearm, subsequent encounter: Secondary | ICD-10-CM | POA: Diagnosis not present

## 2021-07-13 DIAGNOSIS — S81811D Laceration without foreign body, right lower leg, subsequent encounter: Secondary | ICD-10-CM | POA: Diagnosis not present

## 2021-07-13 DIAGNOSIS — S81812D Laceration without foreign body, left lower leg, subsequent encounter: Secondary | ICD-10-CM | POA: Diagnosis not present

## 2021-07-13 DIAGNOSIS — I509 Heart failure, unspecified: Secondary | ICD-10-CM | POA: Diagnosis not present

## 2021-07-13 DIAGNOSIS — S51811D Laceration without foreign body of right forearm, subsequent encounter: Secondary | ICD-10-CM | POA: Diagnosis not present

## 2021-07-13 DIAGNOSIS — I482 Chronic atrial fibrillation, unspecified: Secondary | ICD-10-CM | POA: Diagnosis not present

## 2021-07-13 DIAGNOSIS — I110 Hypertensive heart disease with heart failure: Secondary | ICD-10-CM | POA: Diagnosis not present

## 2021-07-14 DIAGNOSIS — S51811D Laceration without foreign body of right forearm, subsequent encounter: Secondary | ICD-10-CM | POA: Diagnosis not present

## 2021-07-14 DIAGNOSIS — I482 Chronic atrial fibrillation, unspecified: Secondary | ICD-10-CM | POA: Diagnosis not present

## 2021-07-14 DIAGNOSIS — I509 Heart failure, unspecified: Secondary | ICD-10-CM | POA: Diagnosis not present

## 2021-07-14 DIAGNOSIS — I110 Hypertensive heart disease with heart failure: Secondary | ICD-10-CM | POA: Diagnosis not present

## 2021-07-14 DIAGNOSIS — S81811D Laceration without foreign body, right lower leg, subsequent encounter: Secondary | ICD-10-CM | POA: Diagnosis not present

## 2021-07-14 DIAGNOSIS — S81812D Laceration without foreign body, left lower leg, subsequent encounter: Secondary | ICD-10-CM | POA: Diagnosis not present

## 2021-07-15 DIAGNOSIS — I110 Hypertensive heart disease with heart failure: Secondary | ICD-10-CM | POA: Diagnosis not present

## 2021-07-15 DIAGNOSIS — S81812D Laceration without foreign body, left lower leg, subsequent encounter: Secondary | ICD-10-CM | POA: Diagnosis not present

## 2021-07-15 DIAGNOSIS — I509 Heart failure, unspecified: Secondary | ICD-10-CM | POA: Diagnosis not present

## 2021-07-15 DIAGNOSIS — S51811D Laceration without foreign body of right forearm, subsequent encounter: Secondary | ICD-10-CM | POA: Diagnosis not present

## 2021-07-15 DIAGNOSIS — I482 Chronic atrial fibrillation, unspecified: Secondary | ICD-10-CM | POA: Diagnosis not present

## 2021-07-15 DIAGNOSIS — S81811D Laceration without foreign body, right lower leg, subsequent encounter: Secondary | ICD-10-CM | POA: Diagnosis not present

## 2021-07-18 DIAGNOSIS — S51811D Laceration without foreign body of right forearm, subsequent encounter: Secondary | ICD-10-CM | POA: Diagnosis not present

## 2021-07-18 DIAGNOSIS — I482 Chronic atrial fibrillation, unspecified: Secondary | ICD-10-CM | POA: Diagnosis not present

## 2021-07-18 DIAGNOSIS — S81811D Laceration without foreign body, right lower leg, subsequent encounter: Secondary | ICD-10-CM | POA: Diagnosis not present

## 2021-07-18 DIAGNOSIS — S81812D Laceration without foreign body, left lower leg, subsequent encounter: Secondary | ICD-10-CM | POA: Diagnosis not present

## 2021-07-18 DIAGNOSIS — I110 Hypertensive heart disease with heart failure: Secondary | ICD-10-CM | POA: Diagnosis not present

## 2021-07-18 DIAGNOSIS — I509 Heart failure, unspecified: Secondary | ICD-10-CM | POA: Diagnosis not present

## 2021-07-19 ENCOUNTER — Ambulatory Visit: Payer: Medicare Other | Admitting: Cardiovascular Disease

## 2021-07-19 DIAGNOSIS — I110 Hypertensive heart disease with heart failure: Secondary | ICD-10-CM | POA: Diagnosis not present

## 2021-07-19 DIAGNOSIS — I482 Chronic atrial fibrillation, unspecified: Secondary | ICD-10-CM | POA: Diagnosis not present

## 2021-07-19 DIAGNOSIS — I509 Heart failure, unspecified: Secondary | ICD-10-CM | POA: Diagnosis not present

## 2021-07-19 DIAGNOSIS — S51811D Laceration without foreign body of right forearm, subsequent encounter: Secondary | ICD-10-CM | POA: Diagnosis not present

## 2021-07-19 DIAGNOSIS — S81811D Laceration without foreign body, right lower leg, subsequent encounter: Secondary | ICD-10-CM | POA: Diagnosis not present

## 2021-07-19 DIAGNOSIS — S81812D Laceration without foreign body, left lower leg, subsequent encounter: Secondary | ICD-10-CM | POA: Diagnosis not present

## 2021-07-21 DIAGNOSIS — I509 Heart failure, unspecified: Secondary | ICD-10-CM | POA: Diagnosis not present

## 2021-07-21 DIAGNOSIS — S81811D Laceration without foreign body, right lower leg, subsequent encounter: Secondary | ICD-10-CM | POA: Diagnosis not present

## 2021-07-21 DIAGNOSIS — I482 Chronic atrial fibrillation, unspecified: Secondary | ICD-10-CM | POA: Diagnosis not present

## 2021-07-21 DIAGNOSIS — I110 Hypertensive heart disease with heart failure: Secondary | ICD-10-CM | POA: Diagnosis not present

## 2021-07-21 DIAGNOSIS — S81812D Laceration without foreign body, left lower leg, subsequent encounter: Secondary | ICD-10-CM | POA: Diagnosis not present

## 2021-07-21 DIAGNOSIS — S51811D Laceration without foreign body of right forearm, subsequent encounter: Secondary | ICD-10-CM | POA: Diagnosis not present

## 2021-07-22 DIAGNOSIS — I110 Hypertensive heart disease with heart failure: Secondary | ICD-10-CM | POA: Diagnosis not present

## 2021-07-22 DIAGNOSIS — I482 Chronic atrial fibrillation, unspecified: Secondary | ICD-10-CM | POA: Diagnosis not present

## 2021-07-22 DIAGNOSIS — S81811D Laceration without foreign body, right lower leg, subsequent encounter: Secondary | ICD-10-CM | POA: Diagnosis not present

## 2021-07-22 DIAGNOSIS — S81812D Laceration without foreign body, left lower leg, subsequent encounter: Secondary | ICD-10-CM | POA: Diagnosis not present

## 2021-07-22 DIAGNOSIS — I509 Heart failure, unspecified: Secondary | ICD-10-CM | POA: Diagnosis not present

## 2021-07-22 DIAGNOSIS — S51811D Laceration without foreign body of right forearm, subsequent encounter: Secondary | ICD-10-CM | POA: Diagnosis not present

## 2021-07-25 DIAGNOSIS — I482 Chronic atrial fibrillation, unspecified: Secondary | ICD-10-CM | POA: Diagnosis not present

## 2021-07-25 DIAGNOSIS — S81811D Laceration without foreign body, right lower leg, subsequent encounter: Secondary | ICD-10-CM | POA: Diagnosis not present

## 2021-07-25 DIAGNOSIS — S81812D Laceration without foreign body, left lower leg, subsequent encounter: Secondary | ICD-10-CM | POA: Diagnosis not present

## 2021-07-25 DIAGNOSIS — I110 Hypertensive heart disease with heart failure: Secondary | ICD-10-CM | POA: Diagnosis not present

## 2021-07-25 DIAGNOSIS — S51811D Laceration without foreign body of right forearm, subsequent encounter: Secondary | ICD-10-CM | POA: Diagnosis not present

## 2021-07-25 DIAGNOSIS — I509 Heart failure, unspecified: Secondary | ICD-10-CM | POA: Diagnosis not present

## 2021-07-26 DIAGNOSIS — I509 Heart failure, unspecified: Secondary | ICD-10-CM | POA: Diagnosis not present

## 2021-07-26 DIAGNOSIS — S51811D Laceration without foreign body of right forearm, subsequent encounter: Secondary | ICD-10-CM | POA: Diagnosis not present

## 2021-07-26 DIAGNOSIS — I482 Chronic atrial fibrillation, unspecified: Secondary | ICD-10-CM | POA: Diagnosis not present

## 2021-07-26 DIAGNOSIS — I110 Hypertensive heart disease with heart failure: Secondary | ICD-10-CM | POA: Diagnosis not present

## 2021-07-26 DIAGNOSIS — S81812D Laceration without foreign body, left lower leg, subsequent encounter: Secondary | ICD-10-CM | POA: Diagnosis not present

## 2021-07-26 DIAGNOSIS — S81811D Laceration without foreign body, right lower leg, subsequent encounter: Secondary | ICD-10-CM | POA: Diagnosis not present

## 2021-07-27 DIAGNOSIS — I110 Hypertensive heart disease with heart failure: Secondary | ICD-10-CM | POA: Diagnosis not present

## 2021-07-27 DIAGNOSIS — I482 Chronic atrial fibrillation, unspecified: Secondary | ICD-10-CM | POA: Diagnosis not present

## 2021-07-27 DIAGNOSIS — S81811D Laceration without foreign body, right lower leg, subsequent encounter: Secondary | ICD-10-CM | POA: Diagnosis not present

## 2021-07-27 DIAGNOSIS — I509 Heart failure, unspecified: Secondary | ICD-10-CM | POA: Diagnosis not present

## 2021-07-27 DIAGNOSIS — S51811D Laceration without foreign body of right forearm, subsequent encounter: Secondary | ICD-10-CM | POA: Diagnosis not present

## 2021-07-27 DIAGNOSIS — S81812D Laceration without foreign body, left lower leg, subsequent encounter: Secondary | ICD-10-CM | POA: Diagnosis not present

## 2021-07-29 DIAGNOSIS — S81811D Laceration without foreign body, right lower leg, subsequent encounter: Secondary | ICD-10-CM | POA: Diagnosis not present

## 2021-07-29 DIAGNOSIS — S51811D Laceration without foreign body of right forearm, subsequent encounter: Secondary | ICD-10-CM | POA: Diagnosis not present

## 2021-07-29 DIAGNOSIS — H3530 Unspecified macular degeneration: Secondary | ICD-10-CM | POA: Diagnosis not present

## 2021-07-29 DIAGNOSIS — S81812D Laceration without foreign body, left lower leg, subsequent encounter: Secondary | ICD-10-CM | POA: Diagnosis not present

## 2021-07-29 DIAGNOSIS — I2510 Atherosclerotic heart disease of native coronary artery without angina pectoris: Secondary | ICD-10-CM | POA: Diagnosis not present

## 2021-07-29 DIAGNOSIS — I509 Heart failure, unspecified: Secondary | ICD-10-CM | POA: Diagnosis not present

## 2021-07-29 DIAGNOSIS — I110 Hypertensive heart disease with heart failure: Secondary | ICD-10-CM | POA: Diagnosis not present

## 2021-07-29 DIAGNOSIS — Z9181 History of falling: Secondary | ICD-10-CM | POA: Diagnosis not present

## 2021-07-29 DIAGNOSIS — Z87440 Personal history of urinary (tract) infections: Secondary | ICD-10-CM | POA: Diagnosis not present

## 2021-07-29 DIAGNOSIS — K4090 Unilateral inguinal hernia, without obstruction or gangrene, not specified as recurrent: Secondary | ICD-10-CM | POA: Diagnosis not present

## 2021-07-29 DIAGNOSIS — Z7982 Long term (current) use of aspirin: Secondary | ICD-10-CM | POA: Diagnosis not present

## 2021-07-29 DIAGNOSIS — E785 Hyperlipidemia, unspecified: Secondary | ICD-10-CM | POA: Diagnosis not present

## 2021-07-29 DIAGNOSIS — Z7901 Long term (current) use of anticoagulants: Secondary | ICD-10-CM | POA: Diagnosis not present

## 2021-07-29 DIAGNOSIS — I482 Chronic atrial fibrillation, unspecified: Secondary | ICD-10-CM | POA: Diagnosis not present

## 2021-08-01 DIAGNOSIS — I110 Hypertensive heart disease with heart failure: Secondary | ICD-10-CM | POA: Diagnosis not present

## 2021-08-01 DIAGNOSIS — I482 Chronic atrial fibrillation, unspecified: Secondary | ICD-10-CM | POA: Diagnosis not present

## 2021-08-01 DIAGNOSIS — S81812D Laceration without foreign body, left lower leg, subsequent encounter: Secondary | ICD-10-CM | POA: Diagnosis not present

## 2021-08-01 DIAGNOSIS — S81811D Laceration without foreign body, right lower leg, subsequent encounter: Secondary | ICD-10-CM | POA: Diagnosis not present

## 2021-08-01 DIAGNOSIS — S51811D Laceration without foreign body of right forearm, subsequent encounter: Secondary | ICD-10-CM | POA: Diagnosis not present

## 2021-08-01 DIAGNOSIS — I509 Heart failure, unspecified: Secondary | ICD-10-CM | POA: Diagnosis not present

## 2021-08-04 DIAGNOSIS — I110 Hypertensive heart disease with heart failure: Secondary | ICD-10-CM | POA: Diagnosis not present

## 2021-08-04 DIAGNOSIS — I509 Heart failure, unspecified: Secondary | ICD-10-CM | POA: Diagnosis not present

## 2021-08-04 DIAGNOSIS — S51811D Laceration without foreign body of right forearm, subsequent encounter: Secondary | ICD-10-CM | POA: Diagnosis not present

## 2021-08-04 DIAGNOSIS — S81811D Laceration without foreign body, right lower leg, subsequent encounter: Secondary | ICD-10-CM | POA: Diagnosis not present

## 2021-08-04 DIAGNOSIS — S81812D Laceration without foreign body, left lower leg, subsequent encounter: Secondary | ICD-10-CM | POA: Diagnosis not present

## 2021-08-04 DIAGNOSIS — I482 Chronic atrial fibrillation, unspecified: Secondary | ICD-10-CM | POA: Diagnosis not present

## 2021-08-05 DIAGNOSIS — S81812D Laceration without foreign body, left lower leg, subsequent encounter: Secondary | ICD-10-CM | POA: Diagnosis not present

## 2021-08-05 DIAGNOSIS — I482 Chronic atrial fibrillation, unspecified: Secondary | ICD-10-CM | POA: Diagnosis not present

## 2021-08-05 DIAGNOSIS — I110 Hypertensive heart disease with heart failure: Secondary | ICD-10-CM | POA: Diagnosis not present

## 2021-08-05 DIAGNOSIS — I509 Heart failure, unspecified: Secondary | ICD-10-CM | POA: Diagnosis not present

## 2021-08-05 DIAGNOSIS — S51811D Laceration without foreign body of right forearm, subsequent encounter: Secondary | ICD-10-CM | POA: Diagnosis not present

## 2021-08-05 DIAGNOSIS — S81811D Laceration without foreign body, right lower leg, subsequent encounter: Secondary | ICD-10-CM | POA: Diagnosis not present

## 2021-08-08 DIAGNOSIS — S81812D Laceration without foreign body, left lower leg, subsequent encounter: Secondary | ICD-10-CM | POA: Diagnosis not present

## 2021-08-08 DIAGNOSIS — I110 Hypertensive heart disease with heart failure: Secondary | ICD-10-CM | POA: Diagnosis not present

## 2021-08-08 DIAGNOSIS — S51811D Laceration without foreign body of right forearm, subsequent encounter: Secondary | ICD-10-CM | POA: Diagnosis not present

## 2021-08-08 DIAGNOSIS — I482 Chronic atrial fibrillation, unspecified: Secondary | ICD-10-CM | POA: Diagnosis not present

## 2021-08-08 DIAGNOSIS — I509 Heart failure, unspecified: Secondary | ICD-10-CM | POA: Diagnosis not present

## 2021-08-08 DIAGNOSIS — S81811D Laceration without foreign body, right lower leg, subsequent encounter: Secondary | ICD-10-CM | POA: Diagnosis not present

## 2021-08-10 DIAGNOSIS — I482 Chronic atrial fibrillation, unspecified: Secondary | ICD-10-CM | POA: Diagnosis not present

## 2021-08-10 DIAGNOSIS — S51811D Laceration without foreign body of right forearm, subsequent encounter: Secondary | ICD-10-CM | POA: Diagnosis not present

## 2021-08-10 DIAGNOSIS — I110 Hypertensive heart disease with heart failure: Secondary | ICD-10-CM | POA: Diagnosis not present

## 2021-08-10 DIAGNOSIS — I509 Heart failure, unspecified: Secondary | ICD-10-CM | POA: Diagnosis not present

## 2021-08-10 DIAGNOSIS — S81812D Laceration without foreign body, left lower leg, subsequent encounter: Secondary | ICD-10-CM | POA: Diagnosis not present

## 2021-08-10 DIAGNOSIS — S81811D Laceration without foreign body, right lower leg, subsequent encounter: Secondary | ICD-10-CM | POA: Diagnosis not present

## 2021-08-12 DIAGNOSIS — S81812D Laceration without foreign body, left lower leg, subsequent encounter: Secondary | ICD-10-CM | POA: Diagnosis not present

## 2021-08-12 DIAGNOSIS — S81811D Laceration without foreign body, right lower leg, subsequent encounter: Secondary | ICD-10-CM | POA: Diagnosis not present

## 2021-08-12 DIAGNOSIS — I509 Heart failure, unspecified: Secondary | ICD-10-CM | POA: Diagnosis not present

## 2021-08-12 DIAGNOSIS — I110 Hypertensive heart disease with heart failure: Secondary | ICD-10-CM | POA: Diagnosis not present

## 2021-08-12 DIAGNOSIS — S51811D Laceration without foreign body of right forearm, subsequent encounter: Secondary | ICD-10-CM | POA: Diagnosis not present

## 2021-08-12 DIAGNOSIS — I482 Chronic atrial fibrillation, unspecified: Secondary | ICD-10-CM | POA: Diagnosis not present

## 2021-08-17 DIAGNOSIS — S81811D Laceration without foreign body, right lower leg, subsequent encounter: Secondary | ICD-10-CM | POA: Diagnosis not present

## 2021-08-17 DIAGNOSIS — I482 Chronic atrial fibrillation, unspecified: Secondary | ICD-10-CM | POA: Diagnosis not present

## 2021-08-17 DIAGNOSIS — I509 Heart failure, unspecified: Secondary | ICD-10-CM | POA: Diagnosis not present

## 2021-08-17 DIAGNOSIS — S51811D Laceration without foreign body of right forearm, subsequent encounter: Secondary | ICD-10-CM | POA: Diagnosis not present

## 2021-08-17 DIAGNOSIS — S81812D Laceration without foreign body, left lower leg, subsequent encounter: Secondary | ICD-10-CM | POA: Diagnosis not present

## 2021-08-17 DIAGNOSIS — I110 Hypertensive heart disease with heart failure: Secondary | ICD-10-CM | POA: Diagnosis not present

## 2021-08-18 DIAGNOSIS — H353231 Exudative age-related macular degeneration, bilateral, with active choroidal neovascularization: Secondary | ICD-10-CM | POA: Diagnosis not present

## 2021-08-18 DIAGNOSIS — Z7901 Long term (current) use of anticoagulants: Secondary | ICD-10-CM | POA: Diagnosis not present

## 2021-08-19 DIAGNOSIS — I110 Hypertensive heart disease with heart failure: Secondary | ICD-10-CM | POA: Diagnosis not present

## 2021-08-19 DIAGNOSIS — S51811D Laceration without foreign body of right forearm, subsequent encounter: Secondary | ICD-10-CM | POA: Diagnosis not present

## 2021-08-19 DIAGNOSIS — I509 Heart failure, unspecified: Secondary | ICD-10-CM | POA: Diagnosis not present

## 2021-08-19 DIAGNOSIS — S81811D Laceration without foreign body, right lower leg, subsequent encounter: Secondary | ICD-10-CM | POA: Diagnosis not present

## 2021-08-19 DIAGNOSIS — S81812D Laceration without foreign body, left lower leg, subsequent encounter: Secondary | ICD-10-CM | POA: Diagnosis not present

## 2021-08-19 DIAGNOSIS — I482 Chronic atrial fibrillation, unspecified: Secondary | ICD-10-CM | POA: Diagnosis not present

## 2021-08-22 DIAGNOSIS — S51811D Laceration without foreign body of right forearm, subsequent encounter: Secondary | ICD-10-CM | POA: Diagnosis not present

## 2021-08-22 DIAGNOSIS — Z85828 Personal history of other malignant neoplasm of skin: Secondary | ICD-10-CM | POA: Diagnosis not present

## 2021-08-22 DIAGNOSIS — S81811D Laceration without foreign body, right lower leg, subsequent encounter: Secondary | ICD-10-CM | POA: Diagnosis not present

## 2021-08-22 DIAGNOSIS — L0889 Other specified local infections of the skin and subcutaneous tissue: Secondary | ICD-10-CM | POA: Diagnosis not present

## 2021-08-22 DIAGNOSIS — I872 Venous insufficiency (chronic) (peripheral): Secondary | ICD-10-CM | POA: Diagnosis not present

## 2021-08-22 DIAGNOSIS — I8311 Varicose veins of right lower extremity with inflammation: Secondary | ICD-10-CM | POA: Diagnosis not present

## 2021-08-22 DIAGNOSIS — I8312 Varicose veins of left lower extremity with inflammation: Secondary | ICD-10-CM | POA: Diagnosis not present

## 2021-08-22 DIAGNOSIS — I110 Hypertensive heart disease with heart failure: Secondary | ICD-10-CM | POA: Diagnosis not present

## 2021-08-22 DIAGNOSIS — L821 Other seborrheic keratosis: Secondary | ICD-10-CM | POA: Diagnosis not present

## 2021-08-22 DIAGNOSIS — L570 Actinic keratosis: Secondary | ICD-10-CM | POA: Diagnosis not present

## 2021-08-22 DIAGNOSIS — S81812D Laceration without foreign body, left lower leg, subsequent encounter: Secondary | ICD-10-CM | POA: Diagnosis not present

## 2021-08-22 DIAGNOSIS — I509 Heart failure, unspecified: Secondary | ICD-10-CM | POA: Diagnosis not present

## 2021-08-22 DIAGNOSIS — D692 Other nonthrombocytopenic purpura: Secondary | ICD-10-CM | POA: Diagnosis not present

## 2021-08-22 DIAGNOSIS — I482 Chronic atrial fibrillation, unspecified: Secondary | ICD-10-CM | POA: Diagnosis not present

## 2021-08-25 ENCOUNTER — Encounter: Payer: Self-pay | Admitting: Cardiovascular Disease

## 2021-08-25 ENCOUNTER — Telehealth: Payer: Self-pay | Admitting: Cardiovascular Disease

## 2021-08-25 DIAGNOSIS — I110 Hypertensive heart disease with heart failure: Secondary | ICD-10-CM | POA: Diagnosis not present

## 2021-08-25 DIAGNOSIS — S81812D Laceration without foreign body, left lower leg, subsequent encounter: Secondary | ICD-10-CM | POA: Diagnosis not present

## 2021-08-25 DIAGNOSIS — S51811D Laceration without foreign body of right forearm, subsequent encounter: Secondary | ICD-10-CM | POA: Diagnosis not present

## 2021-08-25 DIAGNOSIS — S81811D Laceration without foreign body, right lower leg, subsequent encounter: Secondary | ICD-10-CM | POA: Diagnosis not present

## 2021-08-25 DIAGNOSIS — I509 Heart failure, unspecified: Secondary | ICD-10-CM | POA: Diagnosis not present

## 2021-08-25 DIAGNOSIS — I482 Chronic atrial fibrillation, unspecified: Secondary | ICD-10-CM | POA: Diagnosis not present

## 2021-08-25 NOTE — Telephone Encounter (Signed)
error 

## 2021-08-25 NOTE — Telephone Encounter (Signed)
Called and spoke to pt's son who stated that pt was started on Doxy by the dermatologist for a staph infection. Pt's son stated he thought that [facility] was managing pt's warfarin. However, he was not sure because he was told that pt's INR was to be checked on Tuesday with his PCP.   Informed son that we have documentation from 09/01/2020 that [facility] was managing his INR and that he would need to call them and verify. Informed him to let them know that pt was started on doxy.  Informed son that doxy could increase the INR, making pt more prone to bleeding. Informed him that typically we would encourage some one who was started on doxy to eat some greens and recheck the INR about 3 days after starting doxy. However, he would need to call and update the staff that is managing pt's INR and see what they would advise because we have not seen pt in over a year.   Informed son that only one clinic needs to be managing pt's INR. However, if we could ever be further assistance or if he would like for [place] to monitor pt's INR again then to call us and let us know. Gave him the number to the coumadin clinic [phone].

## 2021-08-25 NOTE — Telephone Encounter (Signed)
Patient son called stating they are going to start his father on an antibiotic that will interfere with his coumadin. He wants to know what should be done.

## 2021-08-26 ENCOUNTER — Ambulatory Visit: Payer: Medicare Other | Admitting: Podiatry

## 2021-08-26 DIAGNOSIS — S51811D Laceration without foreign body of right forearm, subsequent encounter: Secondary | ICD-10-CM | POA: Diagnosis not present

## 2021-08-26 DIAGNOSIS — S81812D Laceration without foreign body, left lower leg, subsequent encounter: Secondary | ICD-10-CM | POA: Diagnosis not present

## 2021-08-26 DIAGNOSIS — I509 Heart failure, unspecified: Secondary | ICD-10-CM | POA: Diagnosis not present

## 2021-08-26 DIAGNOSIS — S81811D Laceration without foreign body, right lower leg, subsequent encounter: Secondary | ICD-10-CM | POA: Diagnosis not present

## 2021-08-26 DIAGNOSIS — I482 Chronic atrial fibrillation, unspecified: Secondary | ICD-10-CM | POA: Diagnosis not present

## 2021-08-26 DIAGNOSIS — I110 Hypertensive heart disease with heart failure: Secondary | ICD-10-CM | POA: Diagnosis not present

## 2021-08-28 DIAGNOSIS — Z7901 Long term (current) use of anticoagulants: Secondary | ICD-10-CM | POA: Diagnosis not present

## 2021-08-28 DIAGNOSIS — Z9181 History of falling: Secondary | ICD-10-CM | POA: Diagnosis not present

## 2021-08-28 DIAGNOSIS — I509 Heart failure, unspecified: Secondary | ICD-10-CM | POA: Diagnosis not present

## 2021-08-28 DIAGNOSIS — I110 Hypertensive heart disease with heart failure: Secondary | ICD-10-CM | POA: Diagnosis not present

## 2021-08-28 DIAGNOSIS — S81811D Laceration without foreign body, right lower leg, subsequent encounter: Secondary | ICD-10-CM | POA: Diagnosis not present

## 2021-08-28 DIAGNOSIS — K4090 Unilateral inguinal hernia, without obstruction or gangrene, not specified as recurrent: Secondary | ICD-10-CM | POA: Diagnosis not present

## 2021-08-28 DIAGNOSIS — Z87440 Personal history of urinary (tract) infections: Secondary | ICD-10-CM | POA: Diagnosis not present

## 2021-08-28 DIAGNOSIS — Z7982 Long term (current) use of aspirin: Secondary | ICD-10-CM | POA: Diagnosis not present

## 2021-08-28 DIAGNOSIS — S51811D Laceration without foreign body of right forearm, subsequent encounter: Secondary | ICD-10-CM | POA: Diagnosis not present

## 2021-08-28 DIAGNOSIS — E785 Hyperlipidemia, unspecified: Secondary | ICD-10-CM | POA: Diagnosis not present

## 2021-08-28 DIAGNOSIS — H3530 Unspecified macular degeneration: Secondary | ICD-10-CM | POA: Diagnosis not present

## 2021-08-28 DIAGNOSIS — S81812D Laceration without foreign body, left lower leg, subsequent encounter: Secondary | ICD-10-CM | POA: Diagnosis not present

## 2021-08-28 DIAGNOSIS — I2510 Atherosclerotic heart disease of native coronary artery without angina pectoris: Secondary | ICD-10-CM | POA: Diagnosis not present

## 2021-08-28 DIAGNOSIS — I482 Chronic atrial fibrillation, unspecified: Secondary | ICD-10-CM | POA: Diagnosis not present

## 2021-08-29 DIAGNOSIS — S51811D Laceration without foreign body of right forearm, subsequent encounter: Secondary | ICD-10-CM | POA: Diagnosis not present

## 2021-08-29 DIAGNOSIS — I509 Heart failure, unspecified: Secondary | ICD-10-CM | POA: Diagnosis not present

## 2021-08-29 DIAGNOSIS — S81812D Laceration without foreign body, left lower leg, subsequent encounter: Secondary | ICD-10-CM | POA: Diagnosis not present

## 2021-08-29 DIAGNOSIS — S81811D Laceration without foreign body, right lower leg, subsequent encounter: Secondary | ICD-10-CM | POA: Diagnosis not present

## 2021-08-29 DIAGNOSIS — I482 Chronic atrial fibrillation, unspecified: Secondary | ICD-10-CM | POA: Diagnosis not present

## 2021-08-29 DIAGNOSIS — I110 Hypertensive heart disease with heart failure: Secondary | ICD-10-CM | POA: Diagnosis not present

## 2021-09-01 ENCOUNTER — Telehealth: Payer: Self-pay | Admitting: Cardiovascular Disease

## 2021-09-01 NOTE — Telephone Encounter (Signed)
Received call from [place] stating she checked pt's INR. Made nurse aware that PCP currently manages [place]/INR.

## 2021-09-02 ENCOUNTER — Telehealth: Payer: Self-pay | Admitting: *Deleted

## 2021-09-02 DIAGNOSIS — I482 Chronic atrial fibrillation, unspecified: Secondary | ICD-10-CM | POA: Diagnosis not present

## 2021-09-02 DIAGNOSIS — S51811D Laceration without foreign body of right forearm, subsequent encounter: Secondary | ICD-10-CM | POA: Diagnosis not present

## 2021-09-02 DIAGNOSIS — I509 Heart failure, unspecified: Secondary | ICD-10-CM | POA: Diagnosis not present

## 2021-09-02 DIAGNOSIS — S81812D Laceration without foreign body, left lower leg, subsequent encounter: Secondary | ICD-10-CM | POA: Diagnosis not present

## 2021-09-02 DIAGNOSIS — I110 Hypertensive heart disease with heart failure: Secondary | ICD-10-CM | POA: Diagnosis not present

## 2021-09-02 DIAGNOSIS — S81811D Laceration without foreign body, right lower leg, subsequent encounter: Secondary | ICD-10-CM | POA: Diagnosis not present

## 2021-09-02 NOTE — Telephone Encounter (Addendum)
Received a fax regarding pt's INR that was done on 08/26/21 and faxed over today. According to our records, pt has been managed by PCP at  [facility]. Pt was last seen by our Anticoagulation Clinic on 08/04/2020 and on 08/30/2020 the follow up appt was canceled and the note states that son, Tyler Williams, reported managed at [facility].   Called [facility] to clarify and to see why this is being faxed here. Had to leave a message for Tyler Williams to callback. Received a callback from [place] with [facility] where the pt resides. She states the pt is being manged by Tyler Williams who is his PCP. She stated that [facility] checks the INR at the facility and calls it into the PCP office but they found out that the PCP was on vacation and the covering provider will not address it. Advised that they should call the PCP back as this is a monitored med with timely results that need to be addressed and that the results are over a week old and we will not address any results over 29 days old. She stated she thought that and understands. Advised that since we have not seen the pt in over a year and do not know his dose than this should be deferred to PCP since they have been managing. She stated she would contact [facility] as well to update them.

## 2021-09-05 DIAGNOSIS — I110 Hypertensive heart disease with heart failure: Secondary | ICD-10-CM | POA: Diagnosis not present

## 2021-09-05 DIAGNOSIS — S51811D Laceration without foreign body of right forearm, subsequent encounter: Secondary | ICD-10-CM | POA: Diagnosis not present

## 2021-09-05 DIAGNOSIS — S81811D Laceration without foreign body, right lower leg, subsequent encounter: Secondary | ICD-10-CM | POA: Diagnosis not present

## 2021-09-05 DIAGNOSIS — I509 Heart failure, unspecified: Secondary | ICD-10-CM | POA: Diagnosis not present

## 2021-09-05 DIAGNOSIS — S81812D Laceration without foreign body, left lower leg, subsequent encounter: Secondary | ICD-10-CM | POA: Diagnosis not present

## 2021-09-05 DIAGNOSIS — I482 Chronic atrial fibrillation, unspecified: Secondary | ICD-10-CM | POA: Diagnosis not present

## 2021-09-07 DIAGNOSIS — S81812D Laceration without foreign body, left lower leg, subsequent encounter: Secondary | ICD-10-CM | POA: Diagnosis not present

## 2021-09-07 DIAGNOSIS — I482 Chronic atrial fibrillation, unspecified: Secondary | ICD-10-CM | POA: Diagnosis not present

## 2021-09-07 DIAGNOSIS — I110 Hypertensive heart disease with heart failure: Secondary | ICD-10-CM | POA: Diagnosis not present

## 2021-09-07 DIAGNOSIS — S51811D Laceration without foreign body of right forearm, subsequent encounter: Secondary | ICD-10-CM | POA: Diagnosis not present

## 2021-09-07 DIAGNOSIS — S81811D Laceration without foreign body, right lower leg, subsequent encounter: Secondary | ICD-10-CM | POA: Diagnosis not present

## 2021-09-07 DIAGNOSIS — I509 Heart failure, unspecified: Secondary | ICD-10-CM | POA: Diagnosis not present

## 2021-09-14 DIAGNOSIS — I509 Heart failure, unspecified: Secondary | ICD-10-CM | POA: Diagnosis not present

## 2021-09-14 DIAGNOSIS — S81811D Laceration without foreign body, right lower leg, subsequent encounter: Secondary | ICD-10-CM | POA: Diagnosis not present

## 2021-09-14 DIAGNOSIS — S51811D Laceration without foreign body of right forearm, subsequent encounter: Secondary | ICD-10-CM | POA: Diagnosis not present

## 2021-09-14 DIAGNOSIS — S81812D Laceration without foreign body, left lower leg, subsequent encounter: Secondary | ICD-10-CM | POA: Diagnosis not present

## 2021-09-14 DIAGNOSIS — I482 Chronic atrial fibrillation, unspecified: Secondary | ICD-10-CM | POA: Diagnosis not present

## 2021-09-14 DIAGNOSIS — I110 Hypertensive heart disease with heart failure: Secondary | ICD-10-CM | POA: Diagnosis not present

## 2021-09-15 DIAGNOSIS — I509 Heart failure, unspecified: Secondary | ICD-10-CM | POA: Diagnosis not present

## 2021-09-15 DIAGNOSIS — S81812D Laceration without foreign body, left lower leg, subsequent encounter: Secondary | ICD-10-CM | POA: Diagnosis not present

## 2021-09-15 DIAGNOSIS — S81811D Laceration without foreign body, right lower leg, subsequent encounter: Secondary | ICD-10-CM | POA: Diagnosis not present

## 2021-09-15 DIAGNOSIS — S51811D Laceration without foreign body of right forearm, subsequent encounter: Secondary | ICD-10-CM | POA: Diagnosis not present

## 2021-09-15 DIAGNOSIS — I482 Chronic atrial fibrillation, unspecified: Secondary | ICD-10-CM | POA: Diagnosis not present

## 2021-09-15 DIAGNOSIS — I110 Hypertensive heart disease with heart failure: Secondary | ICD-10-CM | POA: Diagnosis not present

## 2021-09-21 DIAGNOSIS — I110 Hypertensive heart disease with heart failure: Secondary | ICD-10-CM | POA: Diagnosis not present

## 2021-09-21 DIAGNOSIS — S81811D Laceration without foreign body, right lower leg, subsequent encounter: Secondary | ICD-10-CM | POA: Diagnosis not present

## 2021-09-21 DIAGNOSIS — I509 Heart failure, unspecified: Secondary | ICD-10-CM | POA: Diagnosis not present

## 2021-09-21 DIAGNOSIS — I482 Chronic atrial fibrillation, unspecified: Secondary | ICD-10-CM | POA: Diagnosis not present

## 2021-09-21 DIAGNOSIS — S51811D Laceration without foreign body of right forearm, subsequent encounter: Secondary | ICD-10-CM | POA: Diagnosis not present

## 2021-09-21 DIAGNOSIS — S81812D Laceration without foreign body, left lower leg, subsequent encounter: Secondary | ICD-10-CM | POA: Diagnosis not present

## 2021-09-22 DIAGNOSIS — S81811D Laceration without foreign body, right lower leg, subsequent encounter: Secondary | ICD-10-CM | POA: Diagnosis not present

## 2021-09-22 DIAGNOSIS — S81812D Laceration without foreign body, left lower leg, subsequent encounter: Secondary | ICD-10-CM | POA: Diagnosis not present

## 2021-09-22 DIAGNOSIS — I482 Chronic atrial fibrillation, unspecified: Secondary | ICD-10-CM | POA: Diagnosis not present

## 2021-09-22 DIAGNOSIS — S51811D Laceration without foreign body of right forearm, subsequent encounter: Secondary | ICD-10-CM | POA: Diagnosis not present

## 2021-09-22 DIAGNOSIS — I509 Heart failure, unspecified: Secondary | ICD-10-CM | POA: Diagnosis not present

## 2021-09-22 DIAGNOSIS — I110 Hypertensive heart disease with heart failure: Secondary | ICD-10-CM | POA: Diagnosis not present

## 2021-09-27 DIAGNOSIS — R32 Unspecified urinary incontinence: Secondary | ICD-10-CM | POA: Diagnosis not present

## 2021-09-27 DIAGNOSIS — I482 Chronic atrial fibrillation, unspecified: Secondary | ICD-10-CM | POA: Diagnosis not present

## 2021-09-27 DIAGNOSIS — I110 Hypertensive heart disease with heart failure: Secondary | ICD-10-CM | POA: Diagnosis not present

## 2021-09-27 DIAGNOSIS — Z7982 Long term (current) use of aspirin: Secondary | ICD-10-CM | POA: Diagnosis not present

## 2021-09-27 DIAGNOSIS — H3530 Unspecified macular degeneration: Secondary | ICD-10-CM | POA: Diagnosis not present

## 2021-09-27 DIAGNOSIS — I2510 Atherosclerotic heart disease of native coronary artery without angina pectoris: Secondary | ICD-10-CM | POA: Diagnosis not present

## 2021-09-27 DIAGNOSIS — L97921 Non-pressure chronic ulcer of unspecified part of left lower leg limited to breakdown of skin: Secondary | ICD-10-CM | POA: Diagnosis not present

## 2021-09-27 DIAGNOSIS — K4090 Unilateral inguinal hernia, without obstruction or gangrene, not specified as recurrent: Secondary | ICD-10-CM | POA: Diagnosis not present

## 2021-09-27 DIAGNOSIS — Z7901 Long term (current) use of anticoagulants: Secondary | ICD-10-CM | POA: Diagnosis not present

## 2021-09-27 DIAGNOSIS — Z87440 Personal history of urinary (tract) infections: Secondary | ICD-10-CM | POA: Diagnosis not present

## 2021-09-27 DIAGNOSIS — I872 Venous insufficiency (chronic) (peripheral): Secondary | ICD-10-CM | POA: Diagnosis not present

## 2021-09-27 DIAGNOSIS — I509 Heart failure, unspecified: Secondary | ICD-10-CM | POA: Diagnosis not present

## 2021-09-27 DIAGNOSIS — E785 Hyperlipidemia, unspecified: Secondary | ICD-10-CM | POA: Diagnosis not present

## 2021-09-28 ENCOUNTER — Encounter: Payer: Self-pay | Admitting: Podiatry

## 2021-09-28 ENCOUNTER — Ambulatory Visit (INDEPENDENT_AMBULATORY_CARE_PROVIDER_SITE_OTHER): Payer: Medicare Other | Admitting: Podiatry

## 2021-09-28 DIAGNOSIS — I2510 Atherosclerotic heart disease of native coronary artery without angina pectoris: Secondary | ICD-10-CM | POA: Diagnosis not present

## 2021-09-28 DIAGNOSIS — M79675 Pain in left toe(s): Secondary | ICD-10-CM

## 2021-09-28 DIAGNOSIS — M79674 Pain in right toe(s): Secondary | ICD-10-CM

## 2021-09-28 DIAGNOSIS — B351 Tinea unguium: Secondary | ICD-10-CM

## 2021-09-28 DIAGNOSIS — I872 Venous insufficiency (chronic) (peripheral): Secondary | ICD-10-CM | POA: Diagnosis not present

## 2021-09-28 DIAGNOSIS — L97921 Non-pressure chronic ulcer of unspecified part of left lower leg limited to breakdown of skin: Secondary | ICD-10-CM | POA: Diagnosis not present

## 2021-09-28 DIAGNOSIS — I482 Chronic atrial fibrillation, unspecified: Secondary | ICD-10-CM | POA: Diagnosis not present

## 2021-09-28 DIAGNOSIS — I509 Heart failure, unspecified: Secondary | ICD-10-CM | POA: Diagnosis not present

## 2021-09-28 DIAGNOSIS — I110 Hypertensive heart disease with heart failure: Secondary | ICD-10-CM | POA: Diagnosis not present

## 2021-10-03 NOTE — Progress Notes (Signed)
Subjective: Tyler Williams is a pleasant 88 y.o. male patient seen today for painful thick toenails that are difficult to trim. Pain interferes with ambulation. Aggravating factors include wearing enclosed shoe gear. Pain is relieved with periodic professional debridement.   He presents with his son on today's visit.  Patient has h/o PE and is on coumadin.  PCP is Burnard Bunting, MD. Last visit was: 04/21/2021.  No Known Allergies  Objective: Physical Exam  General: Tyler Williams is a pleasant 88 y.o. Caucasian male, WD, WN in NAD. AAO x 3.   Vascular Examination: Vascular status intact b/l with palpable pedal pulses. Pedal hair absent b/l. CFT <3 seconds. No edema. No pain with calf compression b/l. Skin temperature gradient WNL b/l.   Neurological Examination: Sensation grossly intact b/l with 10 gram monofilament. Vibratory sensation intact b/l.   Dermatological Examination: Pedal skin with normal turgor, texture and tone b/l. Toenails 1-5 b/l thick, discolored, elongated with subungual debris and pain on dorsal palpation. No hyperkeratotic lesions noted b/l.   Musculoskeletal Examination: Muscle strength 5/5 to b/l LE. 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  Radiographs: None  Assessment and Plan:  1. Pain due to onychomycosis of toenails of both feet   Patient was evaluated and treated and all questions answered. Consent given for treatment as described below: -Examined patient. -Patient to continue soft, supportive shoe gear daily. -Toenails 1-5 b/l were debrided in length and girth with sterile nail nippers and dremel without iatrogenic bleeding.  -Patient/POA to call should there be question/concern in the interim.  Return in about 3 months (around 12/29/2021).  Marzetta Board, DPM

## 2021-10-05 ENCOUNTER — Other Ambulatory Visit: Payer: Self-pay | Admitting: *Deleted

## 2021-10-05 ENCOUNTER — Ambulatory Visit (INDEPENDENT_AMBULATORY_CARE_PROVIDER_SITE_OTHER): Payer: Medicare Other | Admitting: Physician Assistant

## 2021-10-05 ENCOUNTER — Encounter: Payer: Self-pay | Admitting: Physician Assistant

## 2021-10-05 ENCOUNTER — Ambulatory Visit
Admission: RE | Admit: 2021-10-05 | Discharge: 2021-10-05 | Disposition: A | Discharge status: Home / Self Care | Payer: Medicare Other | Source: Ambulatory Visit | Attending: Physician Assistant | Admitting: Physician Assistant

## 2021-10-05 VITALS — BP 90/40 | HR 58 | Ht 68.0 in | Wt 171.0 lb

## 2021-10-05 DIAGNOSIS — E785 Hyperlipidemia, unspecified: Secondary | ICD-10-CM | POA: Diagnosis not present

## 2021-10-05 DIAGNOSIS — R8279 Other abnormal findings on microbiological examination of urine: Secondary | ICD-10-CM | POA: Diagnosis not present

## 2021-10-05 DIAGNOSIS — N1831 Chronic kidney disease, stage 3a: Secondary | ICD-10-CM | POA: Diagnosis not present

## 2021-10-05 DIAGNOSIS — Z1339 Encounter for screening examination for other mental health and behavioral disorders: Secondary | ICD-10-CM | POA: Diagnosis not present

## 2021-10-05 DIAGNOSIS — I4821 Permanent atrial fibrillation: Secondary | ICD-10-CM

## 2021-10-05 DIAGNOSIS — Z7901 Long term (current) use of anticoagulants: Secondary | ICD-10-CM | POA: Diagnosis not present

## 2021-10-05 DIAGNOSIS — I9589 Other hypotension: Secondary | ICD-10-CM | POA: Diagnosis not present

## 2021-10-05 DIAGNOSIS — J90 Pleural effusion, not elsewhere classified: Secondary | ICD-10-CM | POA: Diagnosis not present

## 2021-10-05 DIAGNOSIS — D692 Other nonthrombocytopenic purpura: Secondary | ICD-10-CM | POA: Diagnosis not present

## 2021-10-05 DIAGNOSIS — I25119 Atherosclerotic heart disease of native coronary artery with unspecified angina pectoris: Secondary | ICD-10-CM | POA: Diagnosis not present

## 2021-10-05 DIAGNOSIS — I130 Hypertensive heart and chronic kidney disease with heart failure and stage 1 through stage 4 chronic kidney disease, or unspecified chronic kidney disease: Secondary | ICD-10-CM | POA: Diagnosis not present

## 2021-10-05 DIAGNOSIS — I959 Hypotension, unspecified: Secondary | ICD-10-CM | POA: Diagnosis not present

## 2021-10-05 DIAGNOSIS — Z0000 Encounter for general adult medical examination without abnormal findings: Secondary | ICD-10-CM | POA: Diagnosis not present

## 2021-10-05 DIAGNOSIS — I5020 Unspecified systolic (congestive) heart failure: Secondary | ICD-10-CM | POA: Diagnosis not present

## 2021-10-05 DIAGNOSIS — I517 Cardiomegaly: Secondary | ICD-10-CM | POA: Diagnosis not present

## 2021-10-05 DIAGNOSIS — Z952 Presence of prosthetic heart valve: Secondary | ICD-10-CM | POA: Diagnosis not present

## 2021-10-05 DIAGNOSIS — I2510 Atherosclerotic heart disease of native coronary artery without angina pectoris: Secondary | ICD-10-CM | POA: Diagnosis not present

## 2021-10-05 DIAGNOSIS — Z125 Encounter for screening for malignant neoplasm of prostate: Secondary | ICD-10-CM | POA: Diagnosis not present

## 2021-10-05 DIAGNOSIS — Z1331 Encounter for screening for depression: Secondary | ICD-10-CM | POA: Diagnosis not present

## 2021-10-05 DIAGNOSIS — I509 Heart failure, unspecified: Secondary | ICD-10-CM | POA: Diagnosis not present

## 2021-10-05 DIAGNOSIS — R7989 Other specified abnormal findings of blood chemistry: Secondary | ICD-10-CM | POA: Diagnosis not present

## 2021-10-05 DIAGNOSIS — I10 Essential (primary) hypertension: Secondary | ICD-10-CM | POA: Diagnosis not present

## 2021-10-05 DIAGNOSIS — J449 Chronic obstructive pulmonary disease, unspecified: Secondary | ICD-10-CM | POA: Diagnosis not present

## 2021-10-05 DIAGNOSIS — R82998 Other abnormal findings in urine: Secondary | ICD-10-CM | POA: Diagnosis not present

## 2021-10-05 MED ORDER — FUROSEMIDE 40 MG PO TABS: 40.0000 mg | ORAL_TABLET | Freq: Every day | ORAL | 3 refills | Status: DC

## 2021-10-05 MED ORDER — SPIRONOLACTONE 25 MG PO TABS: 25.0000 mg | ORAL_TABLET | Freq: Every day | ORAL | 3 refills | Status: AC

## 2021-10-05 MED ORDER — METOPROLOL SUCCINATE ER 25 MG PO TB24: 12.5000 mg | ORAL_TABLET | Freq: Every day | ORAL | 3 refills | Status: AC

## 2021-10-05 NOTE — Progress Notes (Signed)
Cardiology Office Note:    Date:  10/05/2021   ID:  Tyler Williams, DOB [birth year], MRN [id number]  PCP:  Burnard Bunting, MD  [place] Providers Cardiologist:  Sherren Mocha, MD    Referring MD: Burnard Bunting, MD   Chief Complaint:  Follow-up for CAD, CHF, A-fib    Patient Profile: Permanent atrial fibrillation Coumadin anticoagulation Coronary artery disease S/p POBA in the 1980s, 1990s Known high-grade stenosis of mid RCA; unsuccessful attempted PCI in past Cath 7/19: Mid RCA, 85 >>unsuccessful PCI attempt to the RCA HFmrEF (heart failure with mildly reduced ejection fraction)  Echo 4/22: EF 45-50 with inferior and inferolateral HK, GRII DD Echocardiogram 5/23: EF 45-50, inf and post HK, mild LVH, mildly reduced RVSF, severe BAE, mild to mod MR, trivial AI, RVSP 64  Mild to mod MR (echocardiogram 06/2021) Severe pulmonary hypertension (Echocardiogram 06/2021) Peripheral arterial disease AAA s/p repair Aortic stenosis S/p TAVR 06/2016 Venous insufficiency  Aortic atherosclerosis History of urinary retention  Prior CV Studies: ECHO COMPLETE WO IMAGING [employer]ING [employer]ENT 07/06/2021 EF 45-50, inferior and posterior wall HK, mild concentric LVH, mild reduced RVSF, severely elevated PASP (RVSP 64), severe BAE, mild to moderate MR, moderate TR, s/p TAVR with trivial perivalvular leak and mean gradient 6    Echocardiogram 06/12/20 EF 45-50, mild concentric LVH, GRII DD, inferior and inferolateral HK, mildly reduced RVSF, severe LAE, moderate RAE, mild-moderate MR, TAVR with trivial PVL and mean gradient 6   Cardiac catheterization 08/31/2017 LM mid 30 LAD normal LCx normal RCA mid 95, 85 LVEDP 12 PCI: Unsuccessful to the RCA   Echo 08/28/2017 Mild LVH, EF 55-60, inferolateral akinesis, bioprosthetic aortic valve with trivial AI, MAC, mild MR, severe LAE, moderate RAE, PASP 43   24-hour Holter 02/01/2017 The basic rhythm is marked sinus bradycardia with  average HR 48 bpm There are PVC's, but no sustained arrhythmia No atrial fibrillation noted No tachy-arrhythmias   Carotid US 12/04/2016 Bilateral ICA 1-39  History of Present Illness:   Tyler Williams is a 88 y.o. male with the above problem list.  He was last seen by Dr. Burt Knack in May 2023. He had undergone f/u echocardiogram which was reviewed. This demonstrated stable LVF with EF at 45-50, normally functioning TAVR. PASP was severely elevated but the pt was volume overloaded. His Furosemide was increased. He returns for f/u.  He is here today with his son.  He notes that he had an appointment with his PCP earlier today.  His blood pressure was high at that visit.  After going home, he took his medications.  Here his blood pressure was initially 80 systolic.  On second check it was 90 systolic.  He does note feeling poorly as well as having some chills.  He has not had chest pain, significant shortness of breath.  He wears compression hose to control his lower extremity edema.  He has not had orthopnea, syncope, near syncope.  I did contact his PCPs office.  His GFR was 47 on labs as well as BUN 57, WBC 4.9 and Hgb 11.3.        Past Medical History:  Diagnosis Date   A-fib [facility])    AAA (abdominal aortic aneurysm) ([place])    s/p repair   Aortic stenosis, mild    a. mean gradient 17 mmHg on 04/2012 TTE   Arthritis    knees   CAD (coronary artery disease)    a. s/p multiple percutaneous prior coronary artery interventions b. 05/2012: unsuccessful  PCI to tight RCA->medically managed   Cancer [facility])    skin cancer removed, right hand, left leg, chest   Carotid artery disease (HCC)    0-39% bilateral ICA stenoses 11/2011   Chronic anticoagulation    Coumadin   Coronary artery disease    GERD (gastroesophageal reflux disease)    GERD (gastroesophageal reflux disease)    History of pulmonary embolism    1964 related to dislocated hip on right    History of skin cancer     Hypercholesteremia    Hypertension    Incidental cecal mass noted on CT imaging 05/24/2016   **An incidental finding of potential clinical significance has been found. 4.6 x 5.6 x 3.7 cm masslike lesion in the cecum adjacent to the ileocecal valve may represent a large polyp or colonic neoplasm. Correlation with nonemergent colonoscopy is strongly recommended in the near future to better evaluate this finding.**   Macular degeneration    eye injections   Microscopic hematuria    Followed by urology   Permanent atrial fibrillation [facility])    PVD (peripheral vascular disease) (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   aspirin EC 81 MG tablet Take 81 mg by mouth every morning.   atorvastatin (LIPITOR) 20 MG tablet TAKE 1 TABLET ONCE DAILY.   Calcium-Magnesium-Zinc (CAL-MAG-ZINC PO) Take 1 tablet by mouth every morning.   Cholecalciferol (VITAMIN D-3) 125 MCG (5000 UT) TABS Take 5,000 Units by mouth every morning.   fesoterodine (TOVIAZ) 8 MG TB24 tablet Take 8 mg by mouth every evening.   finasteride (PROSCAR) 5 MG tablet Take 1 tablet (5 mg total) by mouth daily.   furosemide (LASIX) 40 MG tablet Take 1 tablet (40 mg total) by mouth daily.   isosorbide mononitrate (IMDUR) 60 MG 24 hr tablet Take 2 tablets by mouth daily.   LORazepam (ATIVAN) 1 MG tablet Take 1 mg by mouth every evening.   metoprolol succinate (TOPROL XL) 25 MG 24 hr tablet Take 0.5 tablets (12.5 mg total) by mouth daily.   Multiple Vitamins-Minerals (PRESERVISION AREDS) TABS Take 2 tablets by  mouth every morning.   nitroGLYCERIN (NITROSTAT) 0.4 MG SL tablet Place 0.4 mg under the tongue every 5 (five) minutes as needed for chest pain.   polyethylene glycol powder (GLYCOLAX) 17 GM/SCOOP powder Take 1 Container by mouth once.   sacubitril-valsartan (ENTRESTO) 24-26 MG Take 1 tablet by mouth 2 (two) times daily. Please keep upcoming appointment for future refills thank you.   spironolactone (ALDACTONE) 25 MG tablet Take 1 tablet (25 mg total) by mouth daily.   tamsulosin (FLOMAX) 0.4 MG CAPS capsule Take 1 capsule (0.4 mg total) by mouth daily.   warfarin (COUMADIN) 5 MG tablet Take 0.5 tablets (2.5 mg total) by mouth daily.   [DISCONTINUED] furosemide (LASIX) 40 MG tablet Take 2 tablets (80 mg total) by mouth daily.   [DISCONTINUED] metoprolol succinate (TOPROL-XL) 25 MG 24 hr tablet TAKE ONE TABLET BY MOUTH ONCE DAILY   [DISCONTINUED] spironolactone (ALDACTONE) 25 MG tablet Take 1 tablet (25 mg  total) by mouth daily.    Allergies:   Patient has no known allergies.   Social History   Tobacco Use   Smoking status: Never   Smokeless tobacco: Never  Vaping Use   Vaping Use: Never used  Substance Use Topics   Alcohol use: Not Currently    Comment: patient doesn't drink anymore   Drug use: Never    Family Hx: The patient's family history includes Heart attack (age of onset: 5) in his mother.  Review of Systems  Respiratory:  Negative for cough.   Gastrointestinal:  Negative for hematochezia.  Genitourinary:  Negative for dysuria, flank pain and hematuria.     EKGs/Labs/Other Test Reviewed:    EKG:  EKG is  ordered today.  The ekg ordered today demonstrates atrial fibrillation with slow ventricular rate, HR 52, right bundle branch block  Recent Labs: 05/17/2021: ALT 15; B Natriuretic Peptide 804.4; BUN 31; Creatinine, Ser 0.84; Hemoglobin 10.0; Platelets 127; Potassium 3.8; Sodium 139   Recent Lipid Panel No results for input(s): "CHOL", "TRIG", "HDL", "VLDL", "LDLCALC",  "LDLDIRECT" in the last 8760 hours.   Risk Assessment/Calculations/Metrics:    CHA2DS2-VASc Score = 5   This indicates a 7.2% annual risk of stroke. The patient's score is based upon: CHF History: 1 HTN History: 1 Diabetes History: 0 Stroke History: 0 Vascular Disease History: 1 Age Score: 2 Gender Score: 0             Physical Exam:    VS:  BP (!) 90/40   Pulse (!) 58   Ht _0  (1.727 m)   Wt 171 lb (77.6 kg)   SpO2 96%   BMI 26.00 kg/m     Wt Readings from Last 3 Encounters:  10/05/21 171 lb (77.6 kg)  07/06/21 182 lb 6.4 oz (82.7 kg)  05/14/21 175 lb (79.4 kg)    Constitutional:      Appearance: Healthy appearance. Not in distress.  Neck:     Vascular: JVD normal.     Lymphadenopathy: No cervical adenopathy.  Pulmonary:     Effort: Pulmonary effort is normal.     Breath sounds: No wheezing. No rales.  Cardiovascular:     Normal rate. Irregularly irregular rhythm. Normal S1. Normal S2.      Murmurs: There is no murmur.  Edema:    Peripheral edema (tight bilateral - compression hose in place) present.    Pretibial: bilateral 1+ edema of the pretibial area. Abdominal:     Palpations: Abdomen is soft.  Skin:    General: Skin is warm and dry.  Neurological:     General: No focal deficit present.     Mental Status: Alert and oriented to person, place and time.         ASSESSMENT & PLAN:   Hypotension On repeat blood pressure checked by me, his pressure was 100/50.  Etiology for his low blood pressure and chills is not entirely clear.  I reviewed his case further with Dr. Burt Knack in the office today.  We would like to avoid sending him to the emergency room if at all possible.  I have advised him and his son both to go the emergency room if he develops worsening chills, lethargy, confusion, poor appetite, etc.  He understands this.  I will repeat labs while he is here to include CMET, CBC with differential, urinalysis and chest x-ray.  We will also get blood  cultures.  His medications may be contributing to some of his  hypotension.  Therefore, I will reduce his furosemide to 40 mg daily, spironolactone to 12.5 mg daily, Toprol-XL 12.5 mg daily.  Hold Entresto for 3 days, then resume.  Close follow-up next week.  Permanent atrial fibrillation (HCC) Heart rate somewhat slow.  Reduce Toprol to 12.5 mg daily as noted.  Continue warfarin.  This is now managed by primary care.  Coronary artery disease involving native coronary artery with angina pectoris ([place]) History of multiple PCI procedures in the past.  He has known high-grade disease in the RCA and prior PCI attempt was unsuccessful.  He is not currently having anginal symptoms.  Continue aspirin 81 mg daily, Lipitor 20 mg daily, Imdur 120 mg daily.  Reduce Toprol-XL as noted due to low heart rate and low blood pressure.  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) EF 45-50 by recent echocardiogram.  Overall, volume status seems to be stable.  Hold Entresto for 3 days, then resume as noted.  Reduce dose of spironolactone as outlined above.  Reduce dose of beta-blocker as noted above as well.  Follow-up in 1 week.  S/P TAVR (transcatheter aortic valve replacement) Normally functioning TAVR by recent echocardiogram.            Dispo:  Return in about 1 week (around 10/12/2021) for Close Follow Up, w/ Dr. Burt Knack, or [name], PA-C.   Medication Adjustments/Labs and Tests Ordered: Current medicines are reviewed at length with the patient today.  Concerns regarding medicines are outlined above.  Tests Ordered: Orders Placed This Encounter  Procedures   Blood culture (routine single)   DG Chest 2 View   Comp Met (CMET)   CBC w/Diff   Urinalysis   EKG 12-Lead   Medication Changes: Meds ordered this encounter  Medications   furosemide (LASIX) 40 MG tablet    Sig: Take 1 tablet (40 mg total) by mouth daily.    Dispense:  90 tablet    Refill:  3   spironolactone (ALDACTONE) 25 MG tablet     Sig: Take 1 tablet (25 mg total) by mouth daily.    Dispense:  45 tablet    Refill:  3   metoprolol succinate (TOPROL XL) 25 MG 24 hr tablet    Sig: Take 0.5 tablets (12.5 mg total) by mouth daily.    Dispense:  45 tablet    Refill:  3   Signed, [name], PA-C  10/05/2021 6:17 PM    [facility] [place], [place], [place]  [address] Phone: [phone]; Fax: [phone]

## 2021-10-05 NOTE — Assessment & Plan Note (Signed)
On repeat blood pressure checked by me, his pressure was 100/50.  Etiology for his low blood pressure and chills is not entirely clear.  I reviewed his case further with Dr. Burt Knack in the office today.  We would like to avoid sending him to the emergency room if at all possible.  I have advised him and his son both to go the emergency room if he develops worsening chills, lethargy, confusion, poor appetite, etc.  He understands this.  I will repeat labs while he is here to include CMET, CBC with differential, urinalysis and chest x-ray.  We will also get blood cultures.  His medications may be contributing to some of his hypotension.  Therefore, I will reduce his furosemide to 40 mg daily, spironolactone to 12.5 mg daily, Toprol-XL 12.5 mg daily.  Hold Entresto for 3 days, then resume.  Close follow-up next week.

## 2021-10-05 NOTE — Assessment & Plan Note (Signed)
History of multiple PCI procedures in the past.  He has known high-grade disease in the RCA and prior PCI attempt was unsuccessful.  He is not currently having anginal symptoms.  Continue aspirin 81 mg daily, Lipitor 20 mg daily, Imdur 120 mg daily.  Reduce Toprol-XL as noted due to low heart rate and low blood pressure.

## 2021-10-05 NOTE — Assessment & Plan Note (Signed)
Normally functioning TAVR by recent echocardiogram.

## 2021-10-05 NOTE — Assessment & Plan Note (Signed)
EF 45-50 by recent echocardiogram.  Overall, volume status seems to be stable.  Hold Entresto for 3 days, then resume as noted.  Reduce dose of spironolactone as outlined above.  Reduce dose of beta-blocker as noted above as well.  Follow-up in 1 week.

## 2021-10-05 NOTE — Assessment & Plan Note (Signed)
Heart rate somewhat slow.  Reduce Toprol to 12.5 mg daily as noted.  Continue warfarin.  This is now managed by primary care.

## 2021-10-05 NOTE — Patient Instructions (Signed)
Medication Instructions:  Your physician has recommended you make the following change in your medication:   HOLD Entrest for 3 days then restart  DECREASE the Lasix to 40 mg taking 1 daily   DECREASE Spironolactone to 25 mg taking only 1/2 tablet daily  DECREASE Toprol XL 25 mg taking only 1/2 tablet daily   *If you need a refill on your cardiac medications before your next appointment, please call your pharmacy*   Lab Work: [place]:  URINALYSIS, BLOOD CULTURE, CMET, & CBC W DIFF  If you have labs (blood work) drawn today and your tests are completely normal, you will receive your results only by: [place] (if you have MyChart) OR A paper copy in the mail If you have any lab test that is abnormal or we need to change your treatment, we will call you to review the results.   Testing/Procedures: A chest x-ray takes a picture of the organs and structures inside the chest, including the heart, lungs, and blood vessels. This test can show several things, including, whether the heart is enlarges; whether fluid is building up in the lungs; and whether pacemaker / defibrillator leads are still in place. Go to [facility] for Chest X-RAY   Follow-Up: At [facility], you and your health needs are our priority.  As part of our continuing mission to provide you with exceptional heart care, we have created designated Provider Care Teams.  These Care Teams include your primary Cardiologist (physician) and Advanced Practice Providers (APPs -  Physician Assistants and Nurse Practitioners) who all work together to provide you with the care you need, when you need it.  We recommend signing up for the patient portal called "MyChart".  Sign up information is provided on this After Visit Summary.  MyChart is used to connect with patients for Virtual Visits (Telemedicine).  Patients are able to view lab/test results, encounter  notes, upcoming appointments, etc.  Non-urgent messages can be sent to your provider as well.   To learn more about what you can do with MyChart, go to [email or web address].    Your next appointment:   MONDAY, 10/10/21 ARRIVE AT 1:25   The format for your next appointment:   In Person  Provider:   Sherren Mocha, MD     Other Instructions   Important Information About [place]

## 2021-10-06 ENCOUNTER — Telehealth: Payer: Self-pay | Admitting: *Deleted

## 2021-10-06 ENCOUNTER — Other Ambulatory Visit: Payer: Self-pay | Admitting: *Deleted

## 2021-10-06 LAB — URINALYSIS
Bilirubin, UA: NEGATIVE
Glucose, UA: NEGATIVE
Ketones, UA: NEGATIVE
Nitrite, UA: NEGATIVE
Protein,UA: NEGATIVE
RBC, UA: NEGATIVE
Specific Gravity, UA: 1.011 (ref 1.005–1.030)
Urobilinogen, Ur: 0.2 mg/dL (ref 0.2–1.0)
pH, UA: 6 (ref 5.0–7.5)

## 2021-10-06 LAB — CBC WITH DIFFERENTIAL/PLATELET
Basophils Absolute: 0.1 10*3/uL (ref 0.0–0.2)
Basos: 1 %
EOS (ABSOLUTE): 0 10*3/uL (ref 0.0–0.4)
Eos: 1 %
Hematocrit: 35 % — ABNORMAL LOW (ref 37.5–51.0)
Hemoglobin: 11.4 g/dL — ABNORMAL LOW (ref 13.0–17.7)
Immature Grans (Abs): 0 10*3/uL (ref 0.0–0.1)
Immature Granulocytes: 0 %
Lymphocytes Absolute: 1 10*3/uL (ref 0.7–3.1)
Lymphs: 21 %
MCH: 27.4 pg (ref 26.6–33.0)
MCHC: 32.6 g/dL (ref 31.5–35.7)
MCV: 84 fL (ref 79–97)
Monocytes Absolute: 0.5 10*3/uL (ref 0.1–0.9)
Monocytes: 10 %
Neutrophils Absolute: 3.1 10*3/uL (ref 1.4–7.0)
Neutrophils: 67 %
Platelets: 140 10*3/uL — ABNORMAL LOW (ref 150–450)
RBC: 4.16 x10E6/uL (ref 4.14–5.80)
RDW: 18.3 % — ABNORMAL HIGH (ref 11.6–15.4)
WBC: 4.7 10*3/uL (ref 3.4–10.8)

## 2021-10-06 LAB — COMPREHENSIVE METABOLIC PANEL
ALT: 11 IU/L (ref 0–44)
AST: 14 IU/L (ref 0–40)
Albumin/Globulin Ratio: 1.3 (ref 1.2–2.2)
Albumin: 4.2 g/dL (ref 3.6–4.6)
Alkaline Phosphatase: 92 IU/L (ref 44–121)
BUN/Creatinine Ratio: 40 — ABNORMAL HIGH (ref 10–24)
BUN: 57 mg/dL — ABNORMAL HIGH (ref 10–36)
Bilirubin Total: 0.9 mg/dL (ref 0.0–1.2)
CO2: 21 mmol/L (ref 20–29)
Calcium: 9.2 mg/dL (ref 8.6–10.2)
Chloride: 100 mmol/L (ref 96–106)
Creatinine, Ser: 1.41 mg/dL — ABNORMAL HIGH (ref 0.76–1.27)
Globulin, Total: 3.3 g/dL (ref 1.5–4.5)
Glucose: 88 mg/dL (ref 70–99)
Potassium: 4.5 mmol/L (ref 3.5–5.2)
Sodium: 138 mmol/L (ref 134–144)
Total Protein: 7.5 g/dL (ref 6.0–8.5)
eGFR: 46 mL/min/{1.73_m2} — ABNORMAL LOW (ref >59)

## 2021-10-06 MED ORDER — AMOXICILLIN-POT CLAVULANATE 875-125 MG PO TABS: 1.0000 | ORAL_TABLET | Freq: Two times a day (BID) | ORAL | 0 refills | Status: DC

## 2021-10-06 NOTE — Telephone Encounter (Signed)
-----   Message from Liliane Shi, [place] sent at 10/06/2021  8:05 AM EDT ----- Creatinine increased indicating some dehydration. K+, LFTs, Albumin normal. Hgb, platelet count stable. WBC normal. U/A with +leukocytes - likely indicates infection. PLAN:  -If possible, have lab send urine off for culture and sensitivity -Start Augmentin 875 mg twice daily x 7 days -Continue with med adjustments as suggested at [facility] yesterday - in addition, hold Lasix and Spironolactone x 1 day, then resume at reduced dose discussed at [place] yesterday. -BMET 1 week (can do at f/u OV) -Keep f/u as planned  [name], PA-C    10/06/2021 7:53 AM

## 2021-10-06 NOTE — Addendum Note (Signed)
Addended by: Gaetano Net on: 10/06/2021 09:29 AM   Modules accepted: Orders

## 2021-10-07 ENCOUNTER — Telehealth: Payer: Self-pay | Admitting: Cardiovascular Disease

## 2021-10-07 ENCOUNTER — Telehealth: Payer: Self-pay | Admitting: *Deleted

## 2021-10-07 DIAGNOSIS — I509 Heart failure, unspecified: Secondary | ICD-10-CM | POA: Diagnosis not present

## 2021-10-07 DIAGNOSIS — I482 Chronic atrial fibrillation, unspecified: Secondary | ICD-10-CM | POA: Diagnosis not present

## 2021-10-07 DIAGNOSIS — I110 Hypertensive heart disease with heart failure: Secondary | ICD-10-CM | POA: Diagnosis not present

## 2021-10-07 DIAGNOSIS — L97921 Non-pressure chronic ulcer of unspecified part of left lower leg limited to breakdown of skin: Secondary | ICD-10-CM | POA: Diagnosis not present

## 2021-10-07 DIAGNOSIS — I872 Venous insufficiency (chronic) (peripheral): Secondary | ICD-10-CM | POA: Diagnosis not present

## 2021-10-07 DIAGNOSIS — I2510 Atherosclerotic heart disease of native coronary artery without angina pectoris: Secondary | ICD-10-CM | POA: Diagnosis not present

## 2021-10-07 LAB — SPECIMEN STATUS REPORT

## 2021-10-07 MED ORDER — DOXYCYCLINE MONOHYDRATE 100 MG PO TABS: 100.0000 mg | ORAL_TABLET | Freq: Two times a day (BID) | ORAL | 0 refills | Status: DC

## 2021-10-07 NOTE — Telephone Encounter (Signed)
Returned call to [facility] and was advised the issue had been resolved.

## 2021-10-07 NOTE — Telephone Encounter (Signed)
Huja / Vicky @ [facility] [place] have been notified of pt's results. Recommendations have been faxed [phone]

## 2021-10-07 NOTE — Telephone Encounter (Signed)
Tyler Williams is requesting PT INR order due to faulty machine that is not currently allowing her to take readings.

## 2021-10-07 NOTE — Telephone Encounter (Signed)
-----   Message from Liliane Shi, [place] sent at 10/07/2021 12:28 PM EDT ----- CXR with ?pneumonia R lung base. PLAN:  -Continue current medications -Add Doxycycline 100 mg twice daily x 7 days -F/u as planned -If pt is feeling worse, he needs to go to the ED [name], PA-C    10/07/2021 12:23 PM

## 2021-10-10 ENCOUNTER — Encounter: Payer: Self-pay | Admitting: Cardiovascular Disease

## 2021-10-10 ENCOUNTER — Ambulatory Visit: Payer: Medicare Other | Attending: Cardiovascular Disease | Admitting: Cardiovascular Disease

## 2021-10-10 VITALS — BP 120/60 | HR 65 | Ht 68.0 in | Wt 176.0 lb

## 2021-10-10 DIAGNOSIS — I5020 Unspecified systolic (congestive) heart failure: Secondary | ICD-10-CM | POA: Diagnosis not present

## 2021-10-10 DIAGNOSIS — I25119 Atherosclerotic heart disease of native coronary artery with unspecified angina pectoris: Secondary | ICD-10-CM | POA: Diagnosis not present

## 2021-10-10 DIAGNOSIS — I4821 Permanent atrial fibrillation: Secondary | ICD-10-CM | POA: Insufficient documentation

## 2021-10-10 DIAGNOSIS — Z952 Presence of prosthetic heart valve: Secondary | ICD-10-CM | POA: Diagnosis not present

## 2021-10-10 NOTE — Progress Notes (Signed)
Cardiology Office Note:    Date:  10/11/2021   ID:  Tyler Williams, DOB 1926-06-09, MRN [id number]  PCP:  Burnard Bunting, [place] [facility] Cardiologist:  Sherren Mocha, MD     Referring MD: Burnard Bunting, MD   Chief Complaint  Patient presents with   Atrial Fibrillation    History of Present Illness:    Tyler Williams is a 88 y.o. male with a hx of: Permanent atrial fibrillation Coumadin anticoagulation Coronary artery disease S/p POBA in the 1980s, 1990s Known high-grade stenosis of mid RCA; unsuccessful attempted PCI in past Cath 7/19: Mid RCA, 85 >>unsuccessful PCI attempt to the RCA HFmrEF (heart failure with mildly reduced ejection fraction)  Echo 4/22: EF 45-50 with inferior and inferolateral HK, GRII DD Echocardiogram 5/23: EF 45-50, inf and post HK, mild LVH, mildly reduced RVSF, severe BAE, mild to mod MR, trivial AI, RVSP 64  Mild to mod MR (echocardiogram 06/2021) Severe pulmonary hypertension (Echocardiogram 06/2021) Peripheral arterial disease AAA s/p repair Aortic stenosis S/p TAVR 06/2016 Venous insufficiency  Aortic atherosclerosis History of urinary retention  The patient is here with his son today.  He was seen last week by [name], PA.  At that time he was noted to have low blood pressure initially 80 mmHg up to 90 mmHg on second check.  He complained of having chills but no fever.  There was concern that he may have an infection and he was sent for a metabolic panel, CBC with differential, urinalysis, and chest x-ray.  His medications were adjusted with reduction in some of his heart failure medicines at that time, reducing spironolactone to 12.5 mg daily, metoprolol succinate 12.5 mg daily, and Entresto was held for 3 days.  Urinalysis showed 3+ leukocytes, blood cultures were negative, and a chest x-ray showed small bilateral effusions with patchy consolidation in the right lung base cannot exclude pneumonia.  He  was treated with antibiotics.  Today, he reports that he is feeling back to baseline.  He has no specific complaints.  He denies chest pain, cough, further chills, or shortness of breath.  He feels like his leg edema has improved.  Past Medical History:  Diagnosis Date   A-fib [facility])    AAA (abdominal aortic aneurysm) ([place])    s/p repair   Aortic stenosis, mild    a. mean gradient 17 mmHg on 04/2012 TTE   Arthritis    knees   CAD (coronary artery disease)    a. s/p multiple percutaneous prior coronary artery interventions b. 05/2012: unsuccessful PCI to tight RCA->medically managed   Cancer ([place])    skin cancer removed, right hand, left leg, chest   Carotid artery disease (HCC)    0-39% bilateral ICA stenoses 11/2011   Chronic anticoagulation    Coumadin   Coronary artery disease    GERD (gastroesophageal reflux disease)    GERD (gastroesophageal reflux disease)    History of pulmonary embolism    1964 related to dislocated hip on right    History of skin cancer    Hypercholesteremia    Hypertension    Incidental cecal mass noted on CT imaging 05/24/2016   **An incidental finding of potential clinical significance has been found. 4.6 x 5.6 x 3.7 cm masslike lesion in the cecum adjacent to the ileocecal valve may represent a large polyp or colonic neoplasm. 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 Unable to pass wire past RCA lesion->medically managed   CHOLECYSTECTOMY N/A 12/05/2012   Procedure: LAPAROSCOPIC CHOLECYSTECTOMY ;  Surgeon: Edward Jolly, MD;  Location: [place];  Service: General;  Laterality: N/A;   [place] N/A 08/31/2017   Procedure: CORONARY BALLOON ANGIOPLASTY;  Surgeon: Martinique, Peter M, MD;  Location: [place] CV LAB;  Service: Cardiovascular;  Laterality: N/A;   EYE SURGERY     hx of cataract surgery    Strum     right inguinal hernia repair    JOINT REPLACEMENT     left knee replacement, partial right   LEFT HEART CATH AND CORONARY ANGIOGRAPHY N/A 08/31/2017   Procedure: LEFT HEART CATH AND CORONARY ANGIOGRAPHY;  Surgeon: Martinique, Peter M, MD;  Location: [place] CV LAB;  Service: Cardiovascular;  Laterality: N/A;   LEFT HEART CATHETERIZATION WITH CORONARY ANGIOGRAM N/A 05/24/2012   Procedure: LEFT HEART CATHETERIZATION WITH CORONARY ANGIOGRAM;  Surgeon: Burnell Blanks, MD;  Location: [facility] CATH LAB;  Service: Cardiovascular;  Laterality: N/A;   ORTHOPEDIC SURGERY     OTHER SURGICAL HISTORY     right knee popliteal aneurysm surgery stent placed    OTHER SURGICAL HISTORY     PARTIAL KNEE ARTHROPLASTY  01/22/2012   Procedure: UNICOMPARTMENTAL KNEE;  Surgeon: Mauri Pole, MD;  Location: [facility];  Service: Orthopedics;  Laterality: Right;   PERCUTANEOUS CORONARY INTERVENTION-BALLOON ONLY  05/24/2012   Procedure: PERCUTANEOUS CORONARY INTERVENTION-BALLOON ONLY;  Surgeon: Burnell Blanks, MD;  Location: [facility] CATH LAB;  Service: Cardiovascular;;   RIGHT/LEFT HEART CATH AND CORONARY ANGIOGRAPHY N/A 05/12/2016    Procedure: Right/Left Heart Cath and Coronary Angiography;  Surgeon: Sherren Mocha, MD;  Location: [place] CV LAB;  Service: Cardiovascular;  Laterality: N/A;   TEE WITHOUT CARDIOVERSION N/A 06/13/2016   Procedure: TRANSESOPHAGEAL ECHOCARDIOGRAM (TEE);  Surgeon: Sherren Mocha, MD;  Location: [place];  Service: Open Heart Surgery;  Laterality: N/A;   TONSILLECTOMY     TOTAL KNEE ARTHROPLASTY Left    TRANSCATHETER AORTIC VALVE REPLACEMENT, TRANSFEMORAL N/A 06/13/2016   Procedure: TRANSCATHETER AORTIC VALVE REPLACEMENT, TRANSFEMORAL;  Surgeon: Sherren Mocha, MD;  Location: [place];  Service: Open Heart Surgery;  Laterality: N/A;    Current Medications: Current Meds  Medication Sig   acetaminophen (TYLENOL) 500 MG tablet Take 500 mg by mouth every 6 (six) hours as needed for mild pain or moderate pain.   amoxicillin-clavulanate (AUGMENTIN) 875-125 MG tablet Take 1 tablet by mouth 2 (two) times daily.   aspirin EC 81 MG tablet Take 81 mg by mouth  every morning.   atorvastatin (LIPITOR) 20 MG tablet TAKE 1 TABLET ONCE DAILY.   Calcium-Magnesium-Zinc (CAL-MAG-ZINC PO) Take 1 tablet by mouth every morning.   Cholecalciferol (VITAMIN D-3) 125 MCG (5000 UT) TABS Take 5,000 Units by mouth every morning.   doxycycline (ADOXA) 100 MG tablet Take 1 tablet (100 mg total) by mouth 2 (two) times daily.   fesoterodine (TOVIAZ) 8 MG TB24 tablet Take 8 mg by mouth every evening.   finasteride (PROSCAR) 5 MG tablet Take 1 tablet (5 mg total) by mouth daily.   furosemide (LASIX) 40 MG tablet Take 1 tablet (40 mg total) by mouth daily.   isosorbide mononitrate (IMDUR) 60 MG 24 hr tablet Take 2 tablets by mouth daily.   LORazepam (ATIVAN) 1 MG tablet Take 1 mg by mouth every evening.   metoprolol succinate (TOPROL XL) 25 MG 24 hr tablet Take 0.5 tablets (12.5 mg total) by mouth daily.   Multiple Vitamins-Minerals (PRESERVISION AREDS) TABS Take 2 tablets by mouth every morning.   nitroGLYCERIN (NITROSTAT) 0.4 MG  SL tablet Place 0.4 mg under the tongue every 5 (five) minutes as needed for chest pain.   polyethylene glycol powder (GLYCOLAX) 17 GM/SCOOP powder Take 1 Container by mouth once.   sacubitril-valsartan (ENTRESTO) 24-26 MG Take 1 tablet by mouth 2 (two) times daily. Please keep upcoming appointment for future refills thank you.   spironolactone (ALDACTONE) 25 MG tablet Take 1 tablet (25 mg total) by mouth daily.   tamsulosin (FLOMAX) 0.4 MG CAPS capsule Take 1 capsule (0.4 mg total) by mouth daily.   warfarin (COUMADIN) 5 MG tablet Take 0.5 tablets (2.5 mg total) by mouth daily.     Allergies:   Patient has no known allergies.   Social History   Socioeconomic History   Marital status: Widowed    Spouse name: Not on file   Number of children: Not on file   Years of education: Not on file   Highest education level: Not on file  Occupational History   Not on file  Tobacco Use   Smoking status: Never   Smokeless tobacco: Never  Vaping Use   Vaping Use: Never used  Substance and Sexual Activity   Alcohol use: Not Currently    Comment: patient doesn't drink anymore   Drug use: Never   Sexual activity: Not on file  Other Topics Concern   Not on file  Social History Narrative   ** Merged History Encounter **       [place]; played football; score touchdown against [employer]   Social Determinants of Health   Financial Resource Strain: Not on file  Food Insecurity: Not on file  Transportation Needs: Not on file  Physical Activity: Not on file  Stress: Not on file  Social Connections: Not on file     Family History: The patient's family history includes Heart attack (age of onset: 73) in his mother.  ROS:   Please see the history of present illness.    All other systems reviewed and are negative.  EKGs/Labs/Other Studies Reviewed:      EKG:  EKG is not ordered today.    Recent Labs: 05/17/2021: B Natriuretic Peptide 804.4 10/05/2021: ALT 11; BUN 57; Creatinine,  Ser 1.41; Hemoglobin 11.4; Platelets 140; Potassium 4.5; Sodium 138  Recent Lipid Panel    Component Value Date/Time   CHOL 149 08/28/2017 0350   TRIG 95 08/28/2017 0350   HDL 52 08/28/2017 0350   CHOLHDL 2.9 08/28/2017 0350  VLDL 19 08/28/2017 0350   LDLCALC 78 08/28/2017 0350     Risk Assessment/Calculations:    CHA2DS2-VASc Score = 5   This indicates a 7.2% annual risk of stroke. The patient's score is based upon: CHF History: 1 HTN History: 1 Diabetes History: 0 Stroke History: 0 Vascular Disease History: 1 Age Score: 2 Gender Score: 0               Physical Exam:    VS:  BP 120/60   Pulse 65   Ht '5\' 8"'$  (1.727 m)   Wt 176 lb (79.8 kg)   SpO2 96%   BMI 26.76 kg/m     Wt Readings from Last 3 Encounters:  10/10/21 176 lb (79.8 kg)  10/05/21 171 lb (77.6 kg)  07/06/21 182 lb 6.4 oz (82.7 kg)     GEN: Elderly male in no acute distress HEENT: Normal NECK: No JVD; No carotid bruits LYMPHATICS: No lymphadenopathy CARDIAC: Irregularly irregular, no murmurs, rubs, gallops RESPIRATORY:  Clear to auscultation without rales, wheezing or rhonchi  ABDOMEN: Soft, non-tender, non-distended MUSCULOSKELETAL: 1+ left pretibial edema, 1+ right pretibial edema; No deformity  SKIN: Warm and dry NEUROLOGIC:  Alert and oriented x 3 PSYCHIATRIC:  Normal affect   ASSESSMENT:    1. Permanent atrial fibrillation (HCC)   2. 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)   3. S/P TAVR (transcatheter aortic valve replacement)   4. Coronary artery disease involving native coronary artery of native heart with angina pectoris ([place])    PLAN:    In order of problems listed above:  The patient is clinically stable.  Anticoagulated with warfarin.  I am going to keep him on his reduced dose of metoprolol succinate.  Overall he is improved after starting antibiotics and he will complete his course as prescribed by his primary care physician. Volume status is good today.  He will  continue on furosemide 40 mg daily.  He is back on Entresto and tolerating this. He has had normally functioning TAVR valve on serial echo imaging No angina at present.  Continue current medical therapy.   Mr. Lacinda Axon is improved this week.  He appears clinically stable.  He will return in 3 months with labs and APP follow-up.       Medication Adjustments/Labs and Tests Ordered: Current medicines are reviewed at length with the patient today.  Concerns regarding medicines are outlined above.  Orders Placed This Encounter  Procedures   Basic metabolic panel   No orders of the defined types were placed in this encounter.   Patient Instructions  Medication Instructions:  Your physician recommends that you continue on your current medications as directed. Please refer to the Current Medication list given to you today.  *If you need a refill on your cardiac medications before your next appointment, please call your pharmacy*   Lab Work: BMET in 3 months (at next appt) If you have labs (blood work) drawn today and your tests are completely normal, you will receive your results only by: [place] (if you have MyChart) OR A paper copy in the mail If you have any lab test that is abnormal or we need to change your treatment, we will call you to review the results.   Testing/Procedures: NONE   Follow-Up: At [facility], you and your health needs are our priority.  As part of our continuing mission to provide you with exceptional heart care, we have created designated Provider Care Teams.  These Care  Teams include your primary Cardiologist (physician) and Advanced Practice Providers (APPs -  Physician Assistants and Nurse Practitioners) who all work together to provide you with the care you need, when you need it.  Your next appointment:   3 month(s)  The format for your next appointment:   In Person  Provider:   [name], PA-C     Then, Sherren Mocha, MD will  plan to see you again in 6 month(s).      Important Information About Sugar         Signed, Sherren Mocha, MD  10/11/2021 5:39 AM    [place]

## 2021-10-10 NOTE — Patient Instructions (Signed)
Medication Instructions:  Your physician recommends that you continue on your current medications as directed. Please refer to the Current Medication list given to you today.  *If you need a refill on your cardiac medications before your next appointment, please call your pharmacy*   Lab Work: BMET in 3 months (at next appt) If you have labs (blood work) drawn today and your tests are completely normal, you will receive your results only by: [place] (if you have MyChart) OR A paper copy in the mail If you have any lab test that is abnormal or we need to change your treatment, we will call you to review the results.   Testing/Procedures: NONE   Follow-Up: At [facility], you and your health needs are our priority.  As part of our continuing mission to provide you with exceptional heart care, we have created designated Provider Care Teams.  These Care Teams include your primary Cardiologist (physician) and Advanced Practice Providers (APPs -  Physician Assistants and Nurse Practitioners) who all work together to provide you with the care you need, when you need it.  Your next appointment:   3 month(s)  The format for your next appointment:   In Person  Provider:   [name], PA-C     Then, Sherren Mocha, MD will plan to see you again in 6 month(s).      Important Information About Sugar

## 2021-10-11 ENCOUNTER — Encounter: Payer: Self-pay | Admitting: Cardiovascular Disease

## 2021-10-11 LAB — CULTURE, BLOOD (SINGLE)

## 2021-10-13 DIAGNOSIS — I482 Chronic atrial fibrillation, unspecified: Secondary | ICD-10-CM | POA: Diagnosis not present

## 2021-10-13 DIAGNOSIS — I872 Venous insufficiency (chronic) (peripheral): Secondary | ICD-10-CM | POA: Diagnosis not present

## 2021-10-13 DIAGNOSIS — L97921 Non-pressure chronic ulcer of unspecified part of left lower leg limited to breakdown of skin: Secondary | ICD-10-CM | POA: Diagnosis not present

## 2021-10-13 DIAGNOSIS — I509 Heart failure, unspecified: Secondary | ICD-10-CM | POA: Diagnosis not present

## 2021-10-13 DIAGNOSIS — I2510 Atherosclerotic heart disease of native coronary artery without angina pectoris: Secondary | ICD-10-CM | POA: Diagnosis not present

## 2021-10-13 DIAGNOSIS — I110 Hypertensive heart disease with heart failure: Secondary | ICD-10-CM | POA: Diagnosis not present

## 2021-10-20 DIAGNOSIS — I110 Hypertensive heart disease with heart failure: Secondary | ICD-10-CM | POA: Diagnosis not present

## 2021-10-20 DIAGNOSIS — I482 Chronic atrial fibrillation, unspecified: Secondary | ICD-10-CM | POA: Diagnosis not present

## 2021-10-20 DIAGNOSIS — I872 Venous insufficiency (chronic) (peripheral): Secondary | ICD-10-CM | POA: Diagnosis not present

## 2021-10-20 DIAGNOSIS — L97921 Non-pressure chronic ulcer of unspecified part of left lower leg limited to breakdown of skin: Secondary | ICD-10-CM | POA: Diagnosis not present

## 2021-10-20 DIAGNOSIS — I2510 Atherosclerotic heart disease of native coronary artery without angina pectoris: Secondary | ICD-10-CM | POA: Diagnosis not present

## 2021-10-20 DIAGNOSIS — I509 Heart failure, unspecified: Secondary | ICD-10-CM | POA: Diagnosis not present

## 2021-10-25 DIAGNOSIS — M79674 Pain in right toe(s): Secondary | ICD-10-CM | POA: Diagnosis not present

## 2021-10-25 DIAGNOSIS — L84 Corns and callosities: Secondary | ICD-10-CM | POA: Diagnosis not present

## 2021-10-27 DIAGNOSIS — I2510 Atherosclerotic heart disease of native coronary artery without angina pectoris: Secondary | ICD-10-CM | POA: Diagnosis not present

## 2021-10-27 DIAGNOSIS — H3530 Unspecified macular degeneration: Secondary | ICD-10-CM | POA: Diagnosis not present

## 2021-10-27 DIAGNOSIS — K4090 Unilateral inguinal hernia, without obstruction or gangrene, not specified as recurrent: Secondary | ICD-10-CM | POA: Diagnosis not present

## 2021-10-27 DIAGNOSIS — I509 Heart failure, unspecified: Secondary | ICD-10-CM | POA: Diagnosis not present

## 2021-10-27 DIAGNOSIS — L97921 Non-pressure chronic ulcer of unspecified part of left lower leg limited to breakdown of skin: Secondary | ICD-10-CM | POA: Diagnosis not present

## 2021-10-27 DIAGNOSIS — I110 Hypertensive heart disease with heart failure: Secondary | ICD-10-CM | POA: Diagnosis not present

## 2021-10-27 DIAGNOSIS — E785 Hyperlipidemia, unspecified: Secondary | ICD-10-CM | POA: Diagnosis not present

## 2021-10-27 DIAGNOSIS — Z7982 Long term (current) use of aspirin: Secondary | ICD-10-CM | POA: Diagnosis not present

## 2021-10-27 DIAGNOSIS — I872 Venous insufficiency (chronic) (peripheral): Secondary | ICD-10-CM | POA: Diagnosis not present

## 2021-10-27 DIAGNOSIS — Z87440 Personal history of urinary (tract) infections: Secondary | ICD-10-CM | POA: Diagnosis not present

## 2021-10-27 DIAGNOSIS — R32 Unspecified urinary incontinence: Secondary | ICD-10-CM | POA: Diagnosis not present

## 2021-10-27 DIAGNOSIS — Z7901 Long term (current) use of anticoagulants: Secondary | ICD-10-CM | POA: Diagnosis not present

## 2021-10-27 DIAGNOSIS — I482 Chronic atrial fibrillation, unspecified: Secondary | ICD-10-CM | POA: Diagnosis not present

## 2021-11-03 DIAGNOSIS — I872 Venous insufficiency (chronic) (peripheral): Secondary | ICD-10-CM | POA: Diagnosis not present

## 2021-11-03 DIAGNOSIS — I482 Chronic atrial fibrillation, unspecified: Secondary | ICD-10-CM | POA: Diagnosis not present

## 2021-11-03 DIAGNOSIS — Z961 Presence of intraocular lens: Secondary | ICD-10-CM | POA: Diagnosis not present

## 2021-11-03 DIAGNOSIS — H353231 Exudative age-related macular degeneration, bilateral, with active choroidal neovascularization: Secondary | ICD-10-CM | POA: Diagnosis not present

## 2021-11-03 DIAGNOSIS — I110 Hypertensive heart disease with heart failure: Secondary | ICD-10-CM | POA: Diagnosis not present

## 2021-11-03 DIAGNOSIS — I2510 Atherosclerotic heart disease of native coronary artery without angina pectoris: Secondary | ICD-10-CM | POA: Diagnosis not present

## 2021-11-03 DIAGNOSIS — L97921 Non-pressure chronic ulcer of unspecified part of left lower leg limited to breakdown of skin: Secondary | ICD-10-CM | POA: Diagnosis not present

## 2021-11-03 DIAGNOSIS — I509 Heart failure, unspecified: Secondary | ICD-10-CM | POA: Diagnosis not present

## 2021-11-03 DIAGNOSIS — Z7901 Long term (current) use of anticoagulants: Secondary | ICD-10-CM | POA: Diagnosis not present

## 2021-11-07 DIAGNOSIS — I872 Venous insufficiency (chronic) (peripheral): Secondary | ICD-10-CM | POA: Diagnosis not present

## 2021-11-07 DIAGNOSIS — L97921 Non-pressure chronic ulcer of unspecified part of left lower leg limited to breakdown of skin: Secondary | ICD-10-CM | POA: Diagnosis not present

## 2021-11-07 DIAGNOSIS — I2510 Atherosclerotic heart disease of native coronary artery without angina pectoris: Secondary | ICD-10-CM | POA: Diagnosis not present

## 2021-11-07 DIAGNOSIS — I509 Heart failure, unspecified: Secondary | ICD-10-CM | POA: Diagnosis not present

## 2021-11-07 DIAGNOSIS — I482 Chronic atrial fibrillation, unspecified: Secondary | ICD-10-CM | POA: Diagnosis not present

## 2021-11-07 DIAGNOSIS — I110 Hypertensive heart disease with heart failure: Secondary | ICD-10-CM | POA: Diagnosis not present

## 2021-11-10 DIAGNOSIS — Z23 Encounter for immunization: Secondary | ICD-10-CM | POA: Diagnosis not present

## 2021-11-17 DIAGNOSIS — I110 Hypertensive heart disease with heart failure: Secondary | ICD-10-CM | POA: Diagnosis not present

## 2021-11-17 DIAGNOSIS — I482 Chronic atrial fibrillation, unspecified: Secondary | ICD-10-CM | POA: Diagnosis not present

## 2021-11-17 DIAGNOSIS — I872 Venous insufficiency (chronic) (peripheral): Secondary | ICD-10-CM | POA: Diagnosis not present

## 2021-11-17 DIAGNOSIS — I509 Heart failure, unspecified: Secondary | ICD-10-CM | POA: Diagnosis not present

## 2021-11-17 DIAGNOSIS — L97921 Non-pressure chronic ulcer of unspecified part of left lower leg limited to breakdown of skin: Secondary | ICD-10-CM | POA: Diagnosis not present

## 2021-11-17 DIAGNOSIS — I2510 Atherosclerotic heart disease of native coronary artery without angina pectoris: Secondary | ICD-10-CM | POA: Diagnosis not present

## 2021-11-21 ENCOUNTER — Encounter (HOSPITAL_COMMUNITY): Payer: Self-pay | Admitting: *Deleted

## 2021-11-21 ENCOUNTER — Emergency Department (HOSPITAL_COMMUNITY): Payer: Medicare Other

## 2021-11-21 ENCOUNTER — Inpatient Hospital Stay (HOSPITAL_COMMUNITY)
Admission: EM | Admit: 2021-11-21 | Discharge: 2021-11-25 | DRG: 291 | Disposition: A | Discharge status: Skilled Nursing Facility | Payer: Medicare Other | Attending: Internal Medicine | Admitting: Internal Medicine

## 2021-11-21 ENCOUNTER — Other Ambulatory Visit: Payer: Self-pay

## 2021-11-21 DIAGNOSIS — Z9049 Acquired absence of other specified parts of digestive tract: Secondary | ICD-10-CM | POA: Diagnosis not present

## 2021-11-21 DIAGNOSIS — R0789 Other chest pain: Secondary | ICD-10-CM | POA: Diagnosis not present

## 2021-11-21 DIAGNOSIS — I499 Cardiac arrhythmia, unspecified: Secondary | ICD-10-CM | POA: Diagnosis not present

## 2021-11-21 DIAGNOSIS — Z953 Presence of xenogenic heart valve: Secondary | ICD-10-CM

## 2021-11-21 DIAGNOSIS — E1151 Type 2 diabetes mellitus with diabetic peripheral angiopathy without gangrene: Secondary | ICD-10-CM | POA: Diagnosis not present

## 2021-11-21 DIAGNOSIS — R079 Chest pain, unspecified: Secondary | ICD-10-CM | POA: Insufficient documentation

## 2021-11-21 DIAGNOSIS — I701 Atherosclerosis of renal artery: Secondary | ICD-10-CM | POA: Diagnosis present

## 2021-11-21 DIAGNOSIS — F419 Anxiety disorder, unspecified: Secondary | ICD-10-CM | POA: Diagnosis not present

## 2021-11-21 DIAGNOSIS — I4821 Permanent atrial fibrillation: Secondary | ICD-10-CM | POA: Diagnosis not present

## 2021-11-21 DIAGNOSIS — Z7901 Long term (current) use of anticoagulants: Secondary | ICD-10-CM

## 2021-11-21 DIAGNOSIS — I10 Essential (primary) hypertension: Secondary | ICD-10-CM | POA: Diagnosis not present

## 2021-11-21 DIAGNOSIS — E1122 Type 2 diabetes mellitus with diabetic chronic kidney disease: Secondary | ICD-10-CM | POA: Diagnosis present

## 2021-11-21 DIAGNOSIS — N1831 Chronic kidney disease, stage 3a: Secondary | ICD-10-CM | POA: Diagnosis not present

## 2021-11-21 DIAGNOSIS — E7800 Pure hypercholesterolemia, unspecified: Secondary | ICD-10-CM | POA: Diagnosis present

## 2021-11-21 DIAGNOSIS — I350 Nonrheumatic aortic (valve) stenosis: Secondary | ICD-10-CM | POA: Diagnosis not present

## 2021-11-21 DIAGNOSIS — Z9861 Coronary angioplasty status: Secondary | ICD-10-CM

## 2021-11-21 DIAGNOSIS — Z96652 Presence of left artificial knee joint: Secondary | ICD-10-CM | POA: Diagnosis present

## 2021-11-21 DIAGNOSIS — Z952 Presence of prosthetic heart valve: Secondary | ICD-10-CM | POA: Diagnosis not present

## 2021-11-21 DIAGNOSIS — I130 Hypertensive heart and chronic kidney disease with heart failure and stage 1 through stage 4 chronic kidney disease, or unspecified chronic kidney disease: Principal | ICD-10-CM | POA: Diagnosis present

## 2021-11-21 DIAGNOSIS — E782 Mixed hyperlipidemia: Secondary | ICD-10-CM

## 2021-11-21 DIAGNOSIS — I272 Pulmonary hypertension, unspecified: Secondary | ICD-10-CM | POA: Diagnosis present

## 2021-11-21 DIAGNOSIS — D696 Thrombocytopenia, unspecified: Secondary | ICD-10-CM | POA: Diagnosis present

## 2021-11-21 DIAGNOSIS — J90 Pleural effusion, not elsewhere classified: Secondary | ICD-10-CM

## 2021-11-21 DIAGNOSIS — H3530 Unspecified macular degeneration: Secondary | ICD-10-CM | POA: Diagnosis present

## 2021-11-21 DIAGNOSIS — I25119 Atherosclerotic heart disease of native coronary artery with unspecified angina pectoris: Secondary | ICD-10-CM | POA: Diagnosis present

## 2021-11-21 DIAGNOSIS — R7989 Other specified abnormal findings of blood chemistry: Secondary | ICD-10-CM | POA: Diagnosis not present

## 2021-11-21 DIAGNOSIS — K7460 Unspecified cirrhosis of liver: Secondary | ICD-10-CM | POA: Diagnosis not present

## 2021-11-21 DIAGNOSIS — I4510 Unspecified right bundle-branch block: Secondary | ICD-10-CM | POA: Diagnosis present

## 2021-11-21 DIAGNOSIS — Z8249 Family history of ischemic heart disease and other diseases of the circulatory system: Secondary | ICD-10-CM

## 2021-11-21 DIAGNOSIS — N1832 Chronic kidney disease, stage 3b: Secondary | ICD-10-CM

## 2021-11-21 DIAGNOSIS — M1990 Unspecified osteoarthritis, unspecified site: Secondary | ICD-10-CM | POA: Diagnosis present

## 2021-11-21 DIAGNOSIS — Z79899 Other long term (current) drug therapy: Secondary | ICD-10-CM

## 2021-11-21 DIAGNOSIS — Z86711 Personal history of pulmonary embolism: Secondary | ICD-10-CM

## 2021-11-21 DIAGNOSIS — I472 Ventricular tachycardia, unspecified: Secondary | ICD-10-CM | POA: Diagnosis present

## 2021-11-21 DIAGNOSIS — N400 Enlarged prostate without lower urinary tract symptoms: Secondary | ICD-10-CM | POA: Diagnosis not present

## 2021-11-21 DIAGNOSIS — Z743 Need for continuous supervision: Secondary | ICD-10-CM | POA: Diagnosis not present

## 2021-11-21 DIAGNOSIS — I5021 Acute systolic (congestive) heart failure: Secondary | ICD-10-CM | POA: Diagnosis not present

## 2021-11-21 DIAGNOSIS — I5043 Acute on chronic combined systolic (congestive) and diastolic (congestive) heart failure: Secondary | ICD-10-CM | POA: Diagnosis present

## 2021-11-21 DIAGNOSIS — Z85828 Personal history of other malignant neoplasm of skin: Secondary | ICD-10-CM

## 2021-11-21 DIAGNOSIS — Z8679 Personal history of other diseases of the circulatory system: Secondary | ICD-10-CM

## 2021-11-21 DIAGNOSIS — N183 Chronic kidney disease, stage 3 unspecified: Secondary | ICD-10-CM | POA: Diagnosis present

## 2021-11-21 DIAGNOSIS — E785 Hyperlipidemia, unspecified: Secondary | ICD-10-CM | POA: Diagnosis present

## 2021-11-21 DIAGNOSIS — D649 Anemia, unspecified: Secondary | ICD-10-CM | POA: Diagnosis present

## 2021-11-21 DIAGNOSIS — Z8673 Personal history of transient ischemic attack (TIA), and cerebral infarction without residual deficits: Secondary | ICD-10-CM

## 2021-11-21 DIAGNOSIS — I491 Atrial premature depolarization: Secondary | ICD-10-CM | POA: Diagnosis not present

## 2021-11-21 DIAGNOSIS — R6889 Other general symptoms and signs: Secondary | ICD-10-CM | POA: Diagnosis not present

## 2021-11-21 DIAGNOSIS — R791 Abnormal coagulation profile: Secondary | ICD-10-CM | POA: Diagnosis present

## 2021-11-21 DIAGNOSIS — I2510 Atherosclerotic heart disease of native coronary artery without angina pectoris: Secondary | ICD-10-CM | POA: Diagnosis present

## 2021-11-21 DIAGNOSIS — Z7982 Long term (current) use of aspirin: Secondary | ICD-10-CM

## 2021-11-21 DIAGNOSIS — I495 Sick sinus syndrome: Secondary | ICD-10-CM | POA: Diagnosis present

## 2021-11-21 DIAGNOSIS — K219 Gastro-esophageal reflux disease without esophagitis: Secondary | ICD-10-CM | POA: Diagnosis present

## 2021-11-21 HISTORY — DX: Chronic systolic (congestive) heart failure: I50.22

## 2021-11-21 HISTORY — DX: Atherosclerosis of aorta: I70.0

## 2021-11-21 HISTORY — DX: Unspecified cirrhosis of liver: K74.60

## 2021-11-21 HISTORY — DX: Pulmonary hypertension, unspecified: I27.20

## 2021-11-21 HISTORY — DX: Nonrheumatic aortic (valve) stenosis: I35.0

## 2021-11-21 HISTORY — DX: Chronic kidney disease, stage 3a: N18.31

## 2021-11-21 LAB — HEPATIC FUNCTION PANEL
ALT: 14 U/L (ref 0–44)
AST: 24 U/L (ref 15–41)
Albumin: 3.2 g/dL — ABNORMAL LOW (ref 3.5–5.0)
Alkaline Phosphatase: 84 U/L (ref 38–126)
Bilirubin, Direct: 0.4 mg/dL — ABNORMAL HIGH (ref 0.0–0.2)
Indirect Bilirubin: 0.9 mg/dL (ref 0.3–0.9)
Total Bilirubin: 1.3 mg/dL — ABNORMAL HIGH (ref 0.3–1.2)
Total Protein: 6.5 g/dL (ref 6.5–8.1)

## 2021-11-21 LAB — TROPONIN I (HIGH SENSITIVITY)
Troponin I (High Sensitivity): 31 ng/L — ABNORMAL HIGH (ref <18)
Troponin I (High Sensitivity): 30 ng/L — ABNORMAL HIGH (ref <18)

## 2021-11-21 LAB — CBC
HCT: 35.6 % — ABNORMAL LOW (ref 39.0–52.0)
Hemoglobin: 11.8 g/dL — ABNORMAL LOW (ref 13.0–17.0)
MCH: 29.6 pg (ref 26.0–34.0)
MCHC: 33.1 g/dL (ref 30.0–36.0)
MCV: 89.4 fL (ref 80.0–100.0)
Platelets: 122 10*3/uL — ABNORMAL LOW (ref 150–400)
RBC: 3.98 MIL/uL — ABNORMAL LOW (ref 4.22–5.81)
RDW: 16.7 % — ABNORMAL HIGH (ref 11.5–15.5)
WBC: 4.2 10*3/uL (ref 4.0–10.5)
nRBC: 0 % (ref 0.0–0.2)

## 2021-11-21 LAB — BASIC METABOLIC PANEL
Anion gap: 10 (ref 5–15)
BUN: 29 mg/dL — ABNORMAL HIGH (ref 8–23)
CO2: 22 mmol/L (ref 22–32)
Calcium: 9.3 mg/dL (ref 8.9–10.3)
Chloride: 105 mmol/L (ref 98–111)
Creatinine, Ser: 1.15 mg/dL (ref 0.61–1.24)
GFR, Estimated: 59 mL/min — ABNORMAL LOW (ref >60)
Glucose, Bld: 90 mg/dL (ref 70–99)
Potassium: 4.1 mmol/L (ref 3.5–5.1)
Sodium: 137 mmol/L (ref 135–145)

## 2021-11-21 LAB — BRAIN NATRIURETIC PEPTIDE: B Natriuretic Peptide: 1254.9 pg/mL — ABNORMAL HIGH (ref 0.0–100.0)

## 2021-11-21 LAB — PROTIME-INR
INR: 3.2 — ABNORMAL HIGH (ref 0.8–1.2)
Prothrombin Time: 32.7 seconds — ABNORMAL HIGH (ref 11.4–15.2)

## 2021-11-21 MED ORDER — FINASTERIDE 5 MG PO TABS
5.0000 mg | ORAL_TABLET | Freq: Every day | ORAL | Status: DC
  Administered 2021-11-21 – 2021-11-25 (×5): 5 mg via ORAL
  Filled 2021-11-21 (×5): qty 1

## 2021-11-21 MED ORDER — ISOSORBIDE MONONITRATE ER 60 MG PO TB24
120.0000 mg | ORAL_TABLET | Freq: Every day | ORAL | Status: DC
  Administered 2021-11-21 – 2021-11-23 (×3): 120 mg via ORAL
  Filled 2021-11-21: qty 2
  Filled 2021-11-21 (×2): qty 4

## 2021-11-21 MED ORDER — LORAZEPAM 1 MG PO TABS
1.0000 mg | ORAL_TABLET | Freq: Two times a day (BID) | ORAL | Status: DC | PRN
  Administered 2021-11-23 – 2021-11-24 (×2): 1 mg via ORAL
  Filled 2021-11-21 (×2): qty 1

## 2021-11-21 MED ORDER — SACUBITRIL-VALSARTAN 24-26 MG PO TABS
1.0000 | ORAL_TABLET | Freq: Two times a day (BID) | ORAL | Status: DC
  Administered 2021-11-21 – 2021-11-23 (×6): 1 via ORAL
  Filled 2021-11-21 (×6): qty 1

## 2021-11-21 MED ORDER — ACETAMINOPHEN 325 MG PO TABS: 650.0000 mg | ORAL_TABLET | Freq: Four times a day (QID) | ORAL | Status: DC | PRN

## 2021-11-21 MED ORDER — FUROSEMIDE 10 MG/ML IJ SOLN
20.0000 mg | Freq: Once | INTRAMUSCULAR | Status: AC
  Administered 2021-11-21: 20 mg via INTRAVENOUS
  Filled 2021-11-21: qty 2

## 2021-11-21 MED ORDER — SPIRONOLACTONE 25 MG PO TABS
25.0000 mg | ORAL_TABLET | Freq: Every day | ORAL | Status: DC
  Administered 2021-11-21 – 2021-11-25 (×5): 25 mg via ORAL
  Filled 2021-11-21 (×5): qty 1

## 2021-11-21 MED ORDER — ONDANSETRON HCL 4 MG/2ML IJ SOLN
4.0000 mg | Freq: Four times a day (QID) | INTRAMUSCULAR | Status: DC | PRN
  Administered 2021-11-23: 4 mg via INTRAVENOUS
  Filled 2021-11-21: qty 2

## 2021-11-21 MED ORDER — METOPROLOL SUCCINATE ER 25 MG PO TB24
12.5000 mg | ORAL_TABLET | Freq: Every day | ORAL | Status: DC
  Administered 2021-11-21 – 2021-11-25 (×5): 12.5 mg via ORAL
  Filled 2021-11-21 (×5): qty 1

## 2021-11-21 MED ORDER — FESOTERODINE FUMARATE ER 8 MG PO TB24
8.0000 mg | ORAL_TABLET | Freq: Every evening | ORAL | Status: DC
  Administered 2021-11-21 – 2021-11-22 (×2): 8 mg via ORAL
  Filled 2021-11-21 (×3): qty 1

## 2021-11-21 MED ORDER — ACETAMINOPHEN 650 MG RE SUPP: 650.0000 mg | Freq: Four times a day (QID) | RECTAL | Status: DC | PRN

## 2021-11-21 MED ORDER — TAMSULOSIN HCL 0.4 MG PO CAPS
0.4000 mg | ORAL_CAPSULE | Freq: Every day | ORAL | Status: DC
  Administered 2021-11-21 – 2021-11-25 (×5): 0.4 mg via ORAL
  Filled 2021-11-21 (×5): qty 1

## 2021-11-21 MED ORDER — ONDANSETRON HCL 4 MG PO TABS: 4.0000 mg | ORAL_TABLET | Freq: Four times a day (QID) | ORAL | Status: DC | PRN

## 2021-11-21 MED ORDER — ALUM & MAG HYDROXIDE-SIMETH 200-200-20 MG/5ML PO SUSP
30.0000 mL | Freq: Once | ORAL | Status: AC
  Administered 2021-11-21: 30 mL via ORAL
  Filled 2021-11-21: qty 30

## 2021-11-21 MED ORDER — ATORVASTATIN CALCIUM 10 MG PO TABS
20.0000 mg | ORAL_TABLET | Freq: Every evening | ORAL | Status: DC
  Administered 2021-11-21 – 2021-11-25 (×5): 20 mg via ORAL
  Filled 2021-11-21 (×5): qty 2

## 2021-11-21 MED ORDER — FUROSEMIDE 10 MG/ML IJ SOLN
20.0000 mg | Freq: Two times a day (BID) | INTRAMUSCULAR | Status: DC
  Administered 2021-11-21 – 2021-11-22 (×2): 20 mg via INTRAVENOUS
  Filled 2021-11-21 (×2): qty 2

## 2021-11-21 MED ORDER — ALBUTEROL SULFATE (2.5 MG/3ML) 0.083% IN NEBU
2.5000 mg | INHALATION_SOLUTION | Freq: Four times a day (QID) | RESPIRATORY_TRACT | Status: DC | PRN
  Administered 2021-11-21: 2.5 mg via RESPIRATORY_TRACT
  Filled 2021-11-21: qty 3

## 2021-11-21 NOTE — ED Notes (Signed)
PT TRANSPORTED TO XRAY

## 2021-11-21 NOTE — Consult Note (Addendum)
Cardiology Consultation   Patient ID: Tyler Williams MRN: [id number]; DOB: Jun 16, 1926  Admit date: 11/21/2021 Date of Consult: 11/21/2021  PCP:  Burnard Bunting, MD   [facility] Providers Cardiologist:  Sherren Mocha, MD        Patient Profile:   Dilraj Killgore is a 88 y.o. male with a hx of permanent atrial fibrillation, CAD s/p POBA 1980s, 1990s (details unclear), known stenotic RCA (previous unsuccessful intervention), chronic HFmrEF, severe AS s/p TAVR 06/2016, mild-moderate MR by echo 06/2021, severe pulmonary HTN, PVD with AAA s/p repair 1993, bilateral renal artery atherosclerosis, SMA stenosis with collateralization, mild carotid artery disease (1-39% 2018), historical PVCs on telemetry, venous insufficiency, probable CKD stage 3a, urinary rentention, aortic atherosclerosis, very remote PE (1964), arthritis, GERD, macular degeneration, RBBB, cirrhotic morphology on liver on CT, colon mass by CT 2018, remote SSS who is being seen 11/21/2021 for the evaluation of CF at the request of Dr. Fuller Plan.  History of Present Illness:   Mr. Mey has the above complex PMH. His last cath was in 2019 with high grade stenosis of the RCA with unsuccessful PCI due to inability to expand the lesion, otherwise 30% mLM, normal LVEDP. Last echo 06/2021 EF 45-50%, mild LVH, + WMA as outlined below, mildly reduced RVSF, severely elevated PASP, severe BAE, mild-moderate MR, moderate TR, dilated IVC, trivial AI, TAVR parameters unchanged. He was seen in the office in 09/2021 with hypotension and chills. There was concern that he may have an infection and he was sent for a metabolic panel, CBC with differential, urinalysis, and chest x-ray. Spironolactone and metoprolol were decreased and Entresto was held for 3 days. Urinalysis showed 3+ leukocytes, blood cultures were negative, and a chest x-ray showed small bilateral effusions with patchy consolidation in the right lung base cannot  exclude pneumonia. He was treated with antibiotics. He was seen back in the office 10/10/21 and doing much better.   He presented to the hospital via EMS with chest tightness and elevated BP. He reports he's been feeling worsening SOB over the last few days, with increasing LE edema for one day. He then developed persistent chest tightness overnight and had increased difficulty breathing. EMS was called. En route, he had frequent PVCs and 1 4 beat run of NSVT with BP 173/80. hsTroponin 31->30, INR 3.2, Hgb 11.8, plt 122, K 4.1, Cr 1.15, albumin 3.2. CXR shows cardiomegaly, central pulm vessels more prominent suggesting increased CHF, interstitial markings in lower lung fields possibly suggestive of pulmonary edema or pneumonia, small to moderate L>R bilateral pleural effusions. He received '20mg'$  IV Lasix and is written for '20mg'$  IV BID (home dose of Lasix '40mg'$  daily). He also got Maalox. He reports significant UOP with the IV Lasix and is feeling much better, requesting to eat. Last OP weight 8/28 175.9lb; he reports running around 183 at home.    Past Medical History:  Diagnosis Date   AAA (abdominal aortic aneurysm) (HCC)    s/p repair   Aortic atherosclerosis (HCC)    Arthritis    knees   CAD (coronary artery disease)    a. s/p POBA 1980s, 1990s (details unclear). b. known stenotic RCA (previous unsuccessful intervention)   Cancer ([place])    skin cancer removed, right hand, left leg, chest   Carotid artery disease (HCC)    0-39% bilateral ICA stenoses 11/2011   Chronic anticoagulation    Coumadin   Chronic kidney disease, stage 3a (HCC)    Cirrhosis of liver (  [place])    by imaging   Coronary artery disease    GERD (gastroesophageal reflux disease)    Heart failure with mildly reduced ejection fraction (HFmrEF) (HCC)    History of pulmonary embolism    1964 related to dislocated hip on right    History of skin cancer    Hypercholesteremia    Hypertension    Incidental cecal mass noted on CT  imaging 05/24/2016   **An incidental finding of potential clinical significance has been found. 4.6 x 5.6 x 3.7 cm masslike lesion in the cecum adjacent to the ileocecal valve may represent a large polyp or colonic neoplasm. Correlation with nonemergent colonoscopy is strongly recommended in the near future to better evaluate this finding.**   Macular degeneration    eye injections   Microscopic hematuria    Followed by urology   Permanent atrial fibrillation ([place])    Pulmonary hypertension (HCC)    PVD (peripheral vascular disease) ([place])    Bilateral renal artery atherosclerosis, SMA stenosis with collateralization   RBBB    Chronic   S/P TAVR (transcatheter aortic valve replacement) 06/13/2016   29 mm Edwards Sapien 3 transcatheter heart valve placed via percutaneous right transfemoral approach   Severe aortic stenosis    s/p TAVR   SSS (sick sinus syndrome) ([place])     Past Surgical History:  Procedure Laterality Date   ABDOMINAL AORTIC ANEURYSM REPAIR     1993    CARDIAC CATHETERIZATION  05/24/2012   pD1 20%, oCFX 20%, mCFX 30%, ostial Int Br 30%, distal AV groove CFX 30%, pRCA 30-40%, mRCA 99%, dRCA 30%, PL branch small with diffuse 40%. Unable to pass wire past RCA lesion->medically managed   CHOLECYSTECTOMY N/A 12/05/2012   Procedure: LAPAROSCOPIC CHOLECYSTECTOMY ;  Surgeon: Edward Jolly, MD;  Location: [facility];  Service: General;  Laterality: N/A;   [place] N/A 08/31/2017   Procedure: CORONARY BALLOON ANGIOPLASTY;  Surgeon: Martinique, Peter M, MD;  Location: [facility];  Service: Cardiovascular;  Laterality: N/A;   EYE SURGERY     hx of cataract surgery    [place]     right inguinal hernia repair    JOINT REPLACEMENT     left knee replacement, partial right   LEFT HEART CATH AND CORONARY ANGIOGRAPHY N/A 08/31/2017   Procedure: LEFT HEART CATH AND  CORONARY ANGIOGRAPHY;  Surgeon: Martinique, Peter M, MD;  Location: [facility];  Service: Cardiovascular;  Laterality: N/A;   LEFT HEART CATHETERIZATION WITH CORONARY ANGIOGRAM N/A 05/24/2012   Procedure: LEFT HEART CATHETERIZATION WITH CORONARY ANGIOGRAM;  Surgeon: Burnell Blanks, MD;  Location: [facility] C[facility];  Service: Cardiovascular;  Laterality: N/A;   ORTHOPEDIC SURGERY     OTHER SURGICAL HISTORY     right knee popliteal aneurysm surgery stent placed    OTHER SURGICAL HISTORY     PARTIAL KNEE ARTHROPLASTY  01/22/2012   Procedure: UNICOMPARTMENTAL KNEE;  Surgeon: Mauri Pole, MD;  Location: [facility];  Service: Orthopedics;  Laterality: Right;   PERCUTANEOUS CORONARY INTERVENTION-BALLOON ONLY  05/24/2012   Procedure: PERCUTANEOUS CORONARY INTERVENTION-BALLOON ONLY;  Surgeon: Burnell Blanks, MD;  Location: [facility] CATH LAB;  Service: Cardiovascular;;   RIGHT/LEFT HEART CATH AND CORONARY ANGIOGRAPHY N/A 05/12/2016   Procedure: Right/Left Heart Cath and Coronary Angiography;  Surgeon: Sherren Mocha, MD;  Location: [place] [facility];  Service: Cardiovascular;  Laterality: N/A;   TEE WITHOUT CARDIOVERSION N/A 06/13/2016   Procedure: TRANSESOPHAGEAL ECHOCARDIOGRAM (TEE);  Surgeon: Sherren Mocha, MD;  Location: [facility];  Service: Open Heart Surgery;  Laterality: N/A;   TONSILLECTOMY     TOTAL KNEE ARTHROPLASTY Left    TRANSCATHETER AORTIC VALVE REPLACEMENT, TRANSFEMORAL N/A 06/13/2016   Procedure: TRANSCATHETER AORTIC VALVE REPLACEMENT, TRANSFEMORAL;  Surgeon: Sherren Mocha, MD;  Location: [facility];  Service: Open Heart Surgery;  Laterality: N/A;     Home Medications:  Prior to Admission medications   Medication Sig Start Date End Date Taking? Authorizing Provider  atorvastatin (LIPITOR) 20 MG tablet TAKE 1 TABLET ONCE DAILY. Patient taking differently: Take 20 mg by mouth every evening. 12/30/20  Yes Sherren Mocha, MD  Calcium-Magnesium-Zinc (CAL-MAG-ZINC PO) Take 1 tablet by mouth  every morning.   Yes [provider]  Cholecalciferol (VITAMIN D-3) 125 MCG (5000 UT) TABS Take 5,000 Units by mouth every morning.   Yes [provider]  fesoterodine (TOVIAZ) 8 MG TB24 tablet Take 8 mg by mouth every evening.   Yes [provider]  finasteride (PROSCAR) 5 MG tablet Take 1 tablet (5 mg total) by mouth daily. 06/21/20  Yes Charlynne Cousins, MD  furosemide (LASIX) 40 MG tablet Take 1 tablet (40 mg total) by mouth daily. 10/05/21  Yes Weaver, Scott T, PA-C  isosorbide mononitrate (IMDUR) 60 MG 24 hr tablet Take 2 tablets by mouth daily.   Yes [provider]  LORazepam (ATIVAN) 1 MG tablet Take 1 mg by mouth 2 (two) times daily as needed for anxiety.   Yes [provider]  metoprolol succinate (TOPROL XL) 25 MG 24 hr tablet Take 0.5 tablets (12.5 mg total) by mouth daily. 10/05/21  Yes Weaver, Scott T, PA-C  Multiple Vitamins-Minerals (PRESERVISION AREDS) TABS Take 1 tablet by mouth daily. 03/04/09  Yes [provider]  nitroGLYCERIN (NITROSTAT) 0.4 MG SL tablet Place 0.4 mg under the tongue every 5 (five) minutes as needed for chest pain.   Yes [provider]  sacubitril-valsartan (ENTRESTO) 24-26 MG Take 1 tablet by mouth 2 (two) times daily. Please keep upcoming appointment for future refills thank you. 07/04/21  Yes Sherren Mocha, MD  spironolactone (ALDACTONE) 25 MG tablet Take 1 tablet (25 mg total) by mouth daily. 10/05/21  Yes Weaver, Scott T, PA-C  tamsulosin (FLOMAX) 0.4 MG CAPS capsule Take 1 capsule (0.4 mg total) by mouth daily. 06/21/20  Yes Charlynne Cousins, MD  warfarin (COUMADIN) 5 MG tablet Take 0.5 tablets (2.5 mg total) by mouth daily. Patient taking differently: Take 5-7.5 mg by mouth See admin instructions. Take 7.5 by mouth on Mondays and '5mg'$  by mouth on all other days of the week. 06/21/20  Yes Charlynne Cousins, MD  amoxicillin-clavulanate (AUGMENTIN) 875-125 MG tablet Take 1 tablet by mouth 2  (two) times daily. Patient not taking: Reported on 11/21/2021 10/06/21   [name] T, PA-C  doxycycline (ADOXA) 100 MG tablet Take 1 tablet (100 mg total) by mouth 2 (two) times daily. Patient not taking: Reported on 11/21/2021 10/07/21   Liliane Shi, PA-C    Inpatient Medications: Scheduled Meds:  atorvastatin  20 mg Oral QPM   fesoterodine  8 mg Oral QPM   finasteride  5 mg Oral Daily   furosemide  20 mg Intravenous BID   isosorbide mononitrate  120 mg Oral Daily   metoprolol succinate  12.5 mg Oral Daily   sacubitril-valsartan  1 tablet Oral BID  spironolactone  25 mg Oral Daily   tamsulosin  0.4 mg Oral Daily   Continuous Infusions:  PRN Meds: acetaminophen **OR** acetaminophen, albuterol, LORazepam, ondansetron **OR** ondansetron (ZOFRAN) IV  Allergies:   No Known Allergies  Social History:   Social History   Socioeconomic History   Marital status: Widowed    Spouse name: Not on file   Number of children: Not on file   Years of education: Not on file   Highest education level: Not on file  Occupational History   Not on file  Tobacco Use   Smoking status: Never   Smokeless tobacco: Never  Vaping Use   Vaping Use: Never used  Substance and Sexual Activity   Alcohol use: Not Currently    Comment: patient doesn't drink anymore   Drug use: Never   Sexual activity: Not on file  Other Topics Concern   Not on file  Social History Narrative   ** Merged History Encounter **       [place]; played football; score touchdown against [employer]   Social Determinants of Health   Financial Resource Strain: Not on file  Food Insecurity: Not on file  Transportation Needs: Not on file  Physical Activity: Not on file  Stress: Not on file  Social Connections: Not on file  Intimate Partner Violence: Not on file    Family History:   Family History  Problem Relation Age of Onset   Heart attack Mother 37     ROS:  Please see the history of present  illness.  All other ROS reviewed and negative.     Physical Exam/Data:   Vitals:   11/21/21 1130 11/21/21 1200 11/21/21 1300 11/21/21 1400  BP: (!) 144/72 (!) 155/67 135/65 (!) 144/64  Pulse: (!) 51 61 (!) 54 (!) 55  Resp: '17 18 17 19  '$ Temp:      TempSrc:      SpO2: 96% 96% 97% 96%  Weight:      Height:        Intake/Output Summary (Last 24 hours) at 11/21/2021 1503 Last data filed at 11/21/2021 1218 Gross per 24 hour  Intake --  Output 1000 ml  Net -1000 ml      11/21/2021    6:55 AM 10/10/2021    1:38 PM 10/05/2021    1:20 PM  Last 3 Weights  Weight (lbs) 180 lb 176 lb 171 lb  Weight (kg) 81.647 kg 79.833 kg 77.565 kg     Body mass index is 27.37 kg/m.  General: Well developed elderly WM in no acute distress. Head: Normocephalic, atraumatic, sclera marginally icteric, no xanthomas, nares are without discharge. Neck: Negative for carotid bruits. JVP not elevated. Lungs: Decreased BS at bases, otherwise coarse without wheezing or rhonchi. Breathing is unlabored. Heart: irregularly irregular, S1 S2 without murmurs, rubs, or gallops.  Abdomen: Soft, non-tender, non-distended with normoactive bowel sounds. No rebound/guarding. Extremities: No clubbing or cyanosis. 1+ BLE edema with chronic venous stasis changes. Distal pedal pulses are 2+ and equal bilaterally. Neuro: Alert and oriented X 3. Moves all extremities spontaneously. Psych:  Responds to questions appropriately with a normal affect.   EKG:  The EKG was personally reviewed and demonstrates:  atrial fib 67bpm, occasional PVCs, nonspecific STTW changes  Telemetry:  Telemetry was personally reviewed and demonstrates:  atrial fib CVR occasional PVCs  Relevant CV Studies: 2D echo 06/2021   1. Left ventricular ejection fraction, by estimation, is 45 to 50%. The  left ventricle  has mildly decreased function. The left ventricle  demonstrates regional wall motion abnormalities (see scoring  diagram/findings for  description). There is mild  concentric left ventricular hypertrophy. Left ventricular diastolic  function could not be evaluated. There is severe hypokinesis of the left  ventricular, basal-mid inferior wall and inferolateral wall.   2. Right ventricular systolic function is mildly reduced. The right  ventricular size is mildly enlarged. There is severely elevated pulmonary  artery systolic pressure.   3. Left atrial size was severely dilated.   4. Right atrial size was severely dilated.   5. The mitral valve is normal in structure. Mild to moderate mitral valve  regurgitation. No evidence of mitral stenosis. Moderate mitral annular  calcification.   6. Tricuspid valve regurgitation is moderate.   7. There is trivial perivalvular leak at the posterior annulus. The  aortic valve has been repaired/replaced. Aortic valve regurgitation is  trivial. There is a 29 mm Sapien prosthetic (TAVR) valve present in the  aortic position. Procedure Date: 2018.  Aortic valve mean gradient measures 6.0 mmHg. Aortic valve Vmax measures  1.64 m/s. Aortic valve acceleration time measures 88 msec.   8. The inferior vena cava is dilated in size with <50% respiratory  variability, suggesting right atrial pressure of 15 mmHg.   Comparison(s): The left ventricular function is unchanged. The left  ventricular wall motion abnormality is unchanged. TAVR parameters are  unchanged. The estimated systolic PA pressure is higher.   Cath 2019 Mid RCA-1 lesion is 95% stenosed. Mid RCA-2 lesion is 85% stenosed. Mid LM lesion is 30% stenosed. Post intervention, there is a 90% residual stenosis. Balloon angioplasty was performed using a BALLOON SAPPHIRE 2.5X12. LV end diastolic pressure is normal.   1. Single vessel obstructive CAD involving the mid RCA 2. Normal LVEDP 3. No AV gradient.  4. Unsuccessful PCI of the RCA due to inability to expand the lesion. I do not feel that any other attempt at opening the RCA is  feasible and would continue medical management.    Recommend to resume Warfarin, at currently prescribed dose and frequency, on 09/01/17.  Recommend concurrent antiplatelet therapy of Aspirin '81mg'$  daily for 1 month.  Carotid duplex 2018     Final Interpretation:  Right Carotid: There is evidence in the right ICA of a 1-39% stenosis.   Left Carotid: There is evidence in the left ICA of a 1-39% stenosis.   Vertebrals:  Left vertebral artery was patent with antegrade flow.  Retrograde               flow.  Subclavians: Normal flow hemodynamics were seen in bilateral subclavian               arteries.   *See table(s) above for measurements and observations.      Electronically signed by Ida Rogue on 12/05/2016 at 7:31:58 PM.   Laboratory Data:  High Sensitivity Troponin:   Recent Labs  Lab 11/21/21 0658 11/21/21 0913  TROPONINIHS 31* 30*     Chemistry Recent Labs  Lab 11/21/21 0658  NA 137  K 4.1  CL 105  CO2 22  GLUCOSE 90  BUN 29*  CREATININE 1.15  CALCIUM 9.3  GFRNONAA 59*  ANIONGAP 10    Recent Labs  Lab 11/21/21 0658  PROT 6.5  ALBUMIN 3.2*  AST 24  ALT 14  ALKPHOS 84  BILITOT 1.3*   Lipids No results for input(s): "CHOL", "TRIG", "HDL", "LABVLDL", "LDLCALC", "CHOLHDL" in the last 168 hours.  Hematology Recent Labs  Lab 11/21/21 0658  WBC 4.2  RBC 3.98*  HGB 11.8*  HCT 35.6*  MCV 89.4  MCH 29.6  MCHC 33.1  RDW 16.7*  PLT 122*   Thyroid No results for input(s): "TSH", "FREET4" in the last 168 hours.  BNP Recent Labs  Lab 11/21/21 0658  BNP 1,254.9*    DDimer No results for input(s): "DDIMER" in the last 168 hours.   Radiology/Studies:  DG Chest 2 View  Result Date: 11/21/2021 CLINICAL DATA:  Chest pain x1 week EXAM: CHEST - 2 VIEW COMPARISON:  Previous studies including the examination of 10/05/2021 FINDINGS: Transverse diameter of heart is increased. Central pulmonary vessels are more prominent. Increased interstitial and  alveolar markings are seen in parahilar regions and lower lung fields. Small to moderate bilateral pleural effusions are seen, more so on the left side. There is prosthetic stent in the region of aortic valve. There is no pneumothorax. IMPRESSION: Cardiomegaly. Central pulmonary vessels are prominent suggesting CHF. Increased markings in the lower lung fields may suggest pulmonary edema or pneumonia. Small to moderate bilateral pleural effusions, more so on the left side with interval increase. Electronically Signed   By: Elmer Picker M.D.   On: 11/21/2021 08:00     Assessment and Plan:   1. Chest tightness with suspected acute on chronic HFmrEF, known severe pulmonary HTN - suspect potentially driven by hypervolemia with evidence of CHF and pleural effusions, patient already reports excellent UOP and improvement of symptoms with dose of IV Lasix - would continue and titrate if needed - continue home Entresto, metoprolol, Imdur, spironolactone and follow renal function - some suggestion of ?PNA on CXR - will defer to primary team for input; PE felt less likely given chronic anticoag and INR 3.2  2. Known CAD s/p remote POBAs, stenotic RCA treated medically - hsTroponin low/flat, not specifically suggestive of ACS - not on ASA with concomitant warfarin - continue BB, statin  3. Permanent atrial fib with occasional PVCs (known) - on chronic warfarin for anticoagulation, rate controlled on metoprolol - continue rate control strategy with metoprolol  4. PVD -follow clinically as OP f/u  5. Prior abnormal liver imaging - prior CTs have demonstrated possible cirrhotic changes of liver as well as 4.6 x 5.6 x 3.7 cm masslike lesion in the cecum in 2018 adjacent to the ileocecal valve may represent a large polyp or colonic neoplasm - I do not see specific prior follow-up though CT 2019 by PCP did not mention this finding - consider OP GI follow-up  6. Essential HTN -  manage in context  above  7. Severe AS s/p TAVR 2018, mild-moderate MR - continue volume control as above  8. Anemia/thrombocytopenia - similar to prior values, follow   Risk Assessment/Risk Scores:        New York Heart Association (NYHA) Functional Class NYHA Class III  CHA2DS2-VASc Score = 5   This indicates a 7.2% annual risk of stroke. The patient's score is based upon: CHF History: 1 HTN History: 1 Diabetes History: 0 Stroke History: 0 Vascular Disease History: 1 Age Score: 2 Gender Score: 0      For questions or updates, please contact [place] Please consult [email or web address] for contact info under    Signed, Charlie Pitter, PA-C  11/21/2021 3:03 PM

## 2021-11-21 NOTE — Progress Notes (Signed)
Heart Failure Navigator Progress Note  Assessed for Heart & Vascular TOC clinic readiness.  Prior to hospitalization care established with AHF clinic and Dr. Haroldine Laws. Last seen 06/2020. Currently [facility] consulted, follow with Dr. Burt Knack, 10/10/2021.   NF Navigation team will sign-off.   Pricilla Holm, MSN, RN Heart Failure Nurse Navigator

## 2021-11-21 NOTE — ED Provider Notes (Signed)
[facility] EMERGENCY DEPARTMENT Provider Note   CSN: [id number] Arrival date & time: 11/21/21  6759     History  Chief Complaint  Patient presents with   Chest Pain    Tyler Williams is a 88 y.o. male.  With PMH of CAD with RCA stenosis failed stenting in 2019, CHF, A-fib on warfarin, AAA s/p repair, aortic stenosis status post TAVR presenting with substernal chest pain since about 5 AM this morning given 1 nitroglycerin and 324 mg aspirin.  Patient said he had been up earlier than 5 AM but began experiencing substernal chest pain and tightness associated with "the worst reflux" he has ever had that has "eased up" that started around midnight.  He has not eaten or drank today.  He also took 2 Pepcid [facility] home.  The pain is nonradiating and is substernal.  He denies any diaphoresis, shortness of breath, vomiting, fevers, URI symptoms with this pain.  He says he is compliant with his warfarin.  He denies any bleeding or bloody stools.  He does note increased drainage from his lower extremities and swelling and noting that he has had some much fluid coming out from his legs that it has been pooling into his shoes.   Chest Pain      Home Medications Prior to Admission medications   Medication Sig Start Date End Date Taking? Authorizing Provider  atorvastatin (LIPITOR) 20 MG tablet TAKE 1 TABLET ONCE DAILY. Patient taking differently: Take 20 mg by mouth every evening. 12/30/20  Yes Sherren Mocha, MD  Calcium-Magnesium-Zinc (CAL-MAG-ZINC PO) Take 1 tablet by mouth every morning.   Yes [provider]  Cholecalciferol (VITAMIN D-3) 125 MCG (5000 UT) TABS Take 5,000 Units by mouth every morning.   Yes [provider]  fesoterodine (TOVIAZ) 8 MG TB24 tablet Take 8 mg by mouth every evening.   Yes [provider]  finasteride (PROSCAR) 5 MG tablet Take 1 tablet (5 mg total) by mouth daily. 06/21/20  Yes Charlynne Cousins, MD   furosemide (LASIX) 40 MG tablet Take 1 tablet (40 mg total) by mouth daily. 10/05/21  Yes Weaver, Scott T, PA-C  isosorbide mononitrate (IMDUR) 60 MG 24 hr tablet Take 2 tablets by mouth daily.   Yes [provider]  LORazepam (ATIVAN) 1 MG tablet Take 1 mg by mouth 2 (two) times daily as needed for anxiety.   Yes [provider]  metoprolol succinate (TOPROL XL) 25 MG 24 hr tablet Take 0.5 tablets (12.5 mg total) by mouth daily. 10/05/21  Yes Weaver, Scott T, PA-C  Multiple Vitamins-Minerals (PRESERVISION AREDS) TABS Take 1 tablet by mouth daily. 03/04/09  Yes [provider]  nitroGLYCERIN (NITROSTAT) 0.4 MG SL tablet Place 0.4 mg under the tongue every 5 (five) minutes as needed for chest pain.   Yes [provider]  sacubitril-valsartan (ENTRESTO) 24-26 MG Take 1 tablet by mouth 2 (two) times daily. Please keep upcoming appointment for future refills thank you. 07/04/21  Yes Sherren Mocha, MD  spironolactone (ALDACTONE) 25 MG tablet Take 1 tablet (25 mg total) by mouth daily. 10/05/21  Yes Weaver, Scott T, PA-C  tamsulosin (FLOMAX) 0.4 MG CAPS capsule Take 1 capsule (0.4 mg total) by mouth daily. 06/21/20  Yes Charlynne Cousins, MD  warfarin (COUMADIN) 5 MG tablet Take 0.5 tablets (2.5 mg total) by mouth daily. Patient taking differently: Take 5-7.5 mg by mouth See admin instructions. Take 7.5 by mouth on Mondays and '5mg'$  by  mouth on all other days of the week. 06/21/20  Yes Charlynne Cousins, MD  amoxicillin-clavulanate (AUGMENTIN) 875-125 MG tablet Take 1 tablet by mouth 2 (two) times daily. Patient not taking: Reported on 11/21/2021 10/06/21   [name] T, PA-C  doxycycline (ADOXA) 100 MG tablet Take 1 tablet (100 mg total) by mouth 2 (two) times daily. Patient not taking: Reported on 11/21/2021 10/07/21   [name] T, PA-C      Allergies    Patient has no known allergies.    Review of Systems   Review of Systems  Cardiovascular:  Positive for  chest pain.    Physical Exam Updated Vital Signs BP (!) 141/69   Pulse 64   Temp (!) 97.5 F (36.4 C) (Oral)   Resp 19   Ht '5\' 8"'$  (1.727 m)   Wt 81.6 kg   SpO2 95%   BMI 27.37 kg/m  Physical Exam Constitutional: Alert and oriented.  Lying in bed with eyes closed but no acute distress Eyes: Conjunctivae are normal. ENT      Head: Normocephalic and atraumatic.      Nose: No congestion.      Mouth/Throat: Mucous membranes are moist.      Neck: No stridor. Cardiovascular: Irregular rhythm, regular rate, equal palpable radial pulses Respiratory: Normal respiratory effort. Decreased BS at bases.  O2 sat 97-99 on RA Gastrointestinal: Soft and mild epigastrium tenderness, no rebound, no guarding Musculoskeletal: Normal range of motion in all extremities. 2+ erythematous, nontender equal bilateral pitting edema of the lower extremities from the foot to the thigh with venous stasis changes Neurologic: Normal speech and language. No gross focal neurologic deficits are appreciated. Skin: Skin is warm, dry and intact. No rash noted. Psychiatric: Mood and affect are normal. Speech and behavior are normal.  ED Results / Procedures / Treatments   Labs (all labs ordered are listed, but only abnormal results are displayed) Labs Reviewed  BASIC METABOLIC PANEL - Abnormal; Notable for the following components:      Result Value   BUN 29 (*)    GFR, Estimated 59 (*)    All other components within normal limits  CBC - Abnormal; Notable for the following components:   RBC 3.98 (*)    Hemoglobin 11.8 (*)    HCT 35.6 (*)    RDW 16.7 (*)    Platelets 122 (*)    All other components within normal limits  HEPATIC FUNCTION PANEL - Abnormal; Notable for the following components:   Albumin 3.2 (*)    Total Bilirubin 1.3 (*)    Bilirubin, Direct 0.4 (*)    All other components within normal limits  PROTIME-INR - Abnormal; Notable for the following components:   Prothrombin Time 32.7 (*)    INR  3.2 (*)    All other components within normal limits  TROPONIN I (HIGH SENSITIVITY) - Abnormal; Notable for the following components:   Troponin I (High Sensitivity) 30 (*)    All other components within normal limits  TROPONIN I (HIGH SENSITIVITY) - Abnormal; Notable for the following components:   Troponin I (High Sensitivity) 31 (*)    All other components within normal limits  BRAIN NATRIURETIC PEPTIDE    EKG EKG Interpretation  Date/Time:  Monday November 21 2021 06:50:02 EDT Ventricular Rate:  67 PR Interval:    QRS Duration: 161 QT Interval:  468 QTC Calculation: 495 R Axis:   107 Text Interpretation: Atrial fibrillation Ventricular bigeminy Right bundle branch block Confirmed  by Georgina Snell [phone]) on 11/21/2021 7:01:02 AM  Radiology DG Chest 2 View  Result Date: 11/21/2021 CLINICAL DATA:  Chest pain x1 week EXAM: CHEST - 2 VIEW COMPARISON:  Previous studies including the examination of 10/05/2021 FINDINGS: Transverse diameter of heart is increased. Central pulmonary vessels are more prominent. Increased interstitial and alveolar markings are seen in parahilar regions and lower lung fields. Small to moderate bilateral pleural effusions are seen, more so on the left side. There is prosthetic stent in the region of aortic valve. There is no pneumothorax. IMPRESSION: Cardiomegaly. Central pulmonary vessels are prominent suggesting CHF. Increased markings in the lower lung fields may suggest pulmonary edema or pneumonia. Small to moderate bilateral pleural effusions, more so on the left side with interval increase. Electronically Signed   By: Elmer Picker M.D.   On: 11/21/2021 08:00    Procedures Procedures  Remains on constant cardiac monitoring, A-fib with intermittent PVCs  Medications Ordered in ED Medications  atorvastatin (LIPITOR) tablet 20 mg (has no administration in time range)  isosorbide mononitrate (IMDUR) 24 hr tablet 120 mg (has no administration in  time range)  metoprolol succinate (TOPROL-XL) 24 hr tablet 12.5 mg (has no administration in time range)  LORazepam (ATIVAN) tablet 1 mg (has no administration in time range)  fesoterodine (TOVIAZ) tablet 8 mg (has no administration in time range)  finasteride (PROSCAR) tablet 5 mg (has no administration in time range)  tamsulosin (FLOMAX) capsule 0.4 mg (has no administration in time range)  furosemide (LASIX) injection 20 mg (has no administration in time range)  acetaminophen (TYLENOL) tablet 650 mg (has no administration in time range)    Or  acetaminophen (TYLENOL) suppository 650 mg (has no administration in time range)  ondansetron (ZOFRAN) tablet 4 mg (has no administration in time range)    Or  ondansetron (ZOFRAN) injection 4 mg (has no administration in time range)  albuterol (PROVENTIL) (2.5 MG/3ML) 0.083% nebulizer solution 2.5 mg (has no administration in time range)  furosemide (LASIX) injection 20 mg (20 mg Intravenous Given 11/21/21 0824)  alum & mag hydroxide-simeth (MAALOX/MYLANTA) 200-200-20 MG/5ML suspension 30 mL (30 mLs Oral Given 11/21/21 1038)    ED Course/ Medical Decision Making/ A&P                           Medical Decision Making Sally Reimers Conde is a 88 y.o. male.  With PMH of CAD with RCA stenosis failed stenting in 2019, CHF, A-fib on warfarin, AAA s/p repair, aortic stenosis status post TAVR presenting with substernal chest pain since about 5 AM this morning given 1 nitroglycerin and 324 mg aspirin.   Regarding the patient's chest pain, he has a mix of typical and atypical symptoms suggestive of possible GERD or PUD GI source vs ACS and cardiac etiology with significant underlying cardiac history which makes his HEART score 6 prior to troponin.  EKG is A-fib with PVCs and minimal ST depressions in the inferior and lateral leads which looks generally similar to prior.  We will trend troponins, already received aspirin 324 mg and is anticoagulated on  warfarin.  Last catheterization in 2019 showed significant stenosis of RCA which was unable to be stented and previous notes recommend continued medical management.  Chest x-ray was obtained which I also personally interpreted and shows blunting of costophrenic angles worse on the left likely pleural effusions. I do not think aortic dissection as the patient is well-appearing, does not have ripping/tearing  pain and equally has no pulse or neurologic deficits.  Patient's initial high sensitive troponin 31 repeat troponin 30 and flat.  Intermittently still endorsing chest tightness which seems atypical, do not suspect NSTEMI.  Will reach out to hospitalist for continued observation and possible echocardiogram.   Amount and/or Complexity of Data Reviewed Labs: ordered. Radiology: ordered.  Risk OTC drugs. Prescription drug management. Decision regarding hospitalization.    Final Clinical Impression(s) / ED Diagnoses Final diagnoses:  Chest pain, unspecified type  Pleural effusion    Rx / DC Orders ED Discharge Orders     None         Elgie Congo, MD 11/21/21 1158

## 2021-11-21 NOTE — ED Notes (Signed)
Pt stated his wallet with money was given and\ sent home with his son

## 2021-11-21 NOTE — H&P (Addendum)
History and Physical    Patient: Tyler Williams [id number]:[id number] DOB: Mar 24, 1926 DOA: 11/21/2021 DOS: the patient was seen and examined on 11/21/2021 PCP: Burnard Bunting, MD  Patient coming from: [place] via EMS  Chief Complaint:  Chief Complaint  Patient presents with   Chest Pain   HPI: Tyler Williams is a 88 y.o. male with medical history significant of hypertension, hyperlipidemia, persistent atrial fibrillation, heart failure with reduced EF, CAD, AAA s/p repair, aortic stenosis s/p TAVR, and GERD who presents with complaints of chest pain.  He had a significant cough or fever.  Patient reports having substernal chest pain and tightness with reflux symptoms starting late last night around midnight.  His son, daughter-in-law, and daughter present at bedside and provided additional history.  Apparently patient had reduction in his Lasix from 80 mg daily to 40 mg daily after he was found to be hypotensive during his cardiology appointment.  Review of records from that appointment on 8/84 where systolic blood pressures were noted to be initially in the 80s, but with subsequent rechecks improved to 100/50 for which hospitalization was not felt needed.  At that time they reduced furosemide to 40 mg daily, start lactone to 12.5 mg daily,  metoprolol to 12.5 mg daily, and recommend holding Entresto for 3 days prior to resuming.  Since the medication changes the patient has had progressive swelling of his lower extremities with an estimated 15 pound weight gain.  Family notes his legs had started to weep last night.  Patient states that he was unable to rest due to the chest discomfort which led him to come to EMS.  Upon admission into the emergency department patient was seen to be afebrile with pulse 49-82, blood pressures noted up to 167/69, and all other vital signs maintained.  Labs noted high-sensitivity troponin 31->30 and INR 3.2.  Chest x-ray concerning for cardiomegaly with  central pulmonary vessels prominent suggesting CHF with increased lower lung markings to suggest edema or pneumonia and small to moderate bilateral pleural effusions.  Be worse on the left.  Review of Systems: As mentioned in the history of present illness. All other systems reviewed and are negative. Past Medical History:  Diagnosis Date   A-fib [facility])    AAA (abdominal aortic aneurysm) ([place])    s/p repair   Aortic stenosis, mild    a. mean gradient 17 mmHg on 04/2012 TTE   Arthritis    knees   CAD (coronary artery disease)    a. s/p multiple percutaneous prior coronary artery interventions b. 05/2012: unsuccessful PCI to tight RCA->medically managed   Cancer ([place])    skin cancer removed, right hand, left leg, chest   Carotid artery disease (HCC)    0-39% bilateral ICA stenoses 11/2011   Chronic anticoagulation    Coumadin   Coronary artery disease    GERD (gastroesophageal reflux disease)    GERD (gastroesophageal reflux disease)    History of pulmonary embolism    1964 related to dislocated hip on right    History of skin cancer    Hypercholesteremia    Hypertension    Incidental cecal mass noted on CT imaging 05/24/2016   **An incidental finding of potential clinical significance has been found. 4.6 x 5.6 x 3.7 cm masslike lesion in the cecum adjacent to the ileocecal valve may represent a large polyp or colonic neoplasm. Correlation with nonemergent colonoscopy is strongly recommended in the near future to better evaluate this finding.**   Macular degeneration  eye injections   Microscopic hematuria    Followed by urology   Permanent atrial fibrillation [facility])    PVD (peripheral vascular disease) ([place])    Bilateral renal artery atherosclerosis, SMA stenosis with collateralization   RBBB    Chronic   S/P TAVR (transcatheter aortic valve replacement) 06/13/2016   29 mm Edwards Sapien 3 transcatheter heart valve placed via percutaneous right transfemoral approach   SSS (sick  sinus syndrome) ([place])    Past Surgical History:  Procedure Laterality Date   ABDOMINAL AORTIC ANEURYSM REPAIR     1993    CARDIAC CATHETERIZATION  05/24/2012   pD1 20%, oCFX 20%, mCFX 30%, ostial Int Br 30%, distal AV groove CFX 30%, pRCA 30-40%, mRCA 99%, dRCA 30%, PL branch small with diffuse 40%. Unable to pass wire past RCA lesion->medically managed   CHOLECYSTECTOMY N/A 12/05/2012   Procedure: LAPAROSCOPIC CHOLECYSTECTOMY ;  Surgeon: Edward Jolly, MD;  Location: [place];  Service: General;  Laterality: N/A;   [place] N/A 08/31/2017   Procedure: CORONARY BALLOON ANGIOPLASTY;  Surgeon: Martinique, Peter M, MD;  Location: [place] CV LAB;  Service: Cardiovascular;  Laterality: N/A;   EYE SURGERY     hx of cataract surgery    [place]     right inguinal hernia repair    JOINT REPLACEMENT     left knee replacement, partial right   LEFT HEART CATH AND CORONARY ANGIOGRAPHY N/A 08/31/2017   Procedure: LEFT HEART CATH AND CORONARY ANGIOGRAPHY;  Surgeon: Martinique, Peter M, MD;  Location: [place] CV LAB;  Service: Cardiovascular;  Laterality: N/A;   LEFT HEART CATHETERIZATION WITH CORONARY ANGIOGRAM N/A 05/24/2012   Procedure: LEFT HEART CATHETERIZATION WITH CORONARY ANGIOGRAM;  Surgeon: Burnell Blanks, MD;  Location: [facility] CATH LAB;  Service: Cardiovascular;  Laterality: N/A;   ORTHOPEDIC SURGERY     OTHER SURGICAL HISTORY     right knee popliteal aneurysm surgery stent placed    OTHER SURGICAL HISTORY     PARTIAL KNEE ARTHROPLASTY  01/22/2012   Procedure: UNICOMPARTMENTAL KNEE;  Surgeon: Mauri Pole, MD;  Location: [facility];  Service: Orthopedics;  Laterality: Right;   PERCUTANEOUS CORONARY INTERVENTION-BALLOON ONLY  05/24/2012   Procedure: PERCUTANEOUS CORONARY INTERVENTION-BALLOON ONLY;  Surgeon: Burnell Blanks, MD;  Location: [facility] LAB;  Service:  Cardiovascular;;   RIGHT/LEFT HEART CATH AND CORONARY ANGIOGRAPHY N/A 05/12/2016   Procedure: Right/Left Heart Cath and Coronary Angiography;  Surgeon: Sherren Mocha, MD;  Location: [place] CV LAB;  Service: Cardiovascular;  Laterality: N/A;   TEE WITHOUT CARDIOVERSION N/A 06/13/2016   Procedure: TRANSESOPHAGEAL ECHOCARDIOGRAM (TEE);  Surgeon: Sherren Mocha, MD;  Location: [place];  Service: Open Heart Surgery;  Laterality: N/A;   TONSILLECTOMY     TOTAL KNEE ARTHROPLASTY Left    TRANSCATHETER AORTIC VALVE REPLACEMENT, TRANSFEMORAL N/A 06/13/2016   Procedure: TRANSCATHETER AORTIC VALVE REPLACEMENT, TRANSFEMORAL;  Surgeon: Sherren Mocha, MD;  Location: [place];  Service: Open Heart Surgery;  Laterality: N/A;   Social History:  reports that he has never smoked. He has never used smokeless tobacco. He reports that he does not currently use alcohol. He reports that he does not use drugs.  No Known Allergies  Family History  Problem Relation Age of Onset   Heart attack Mother 40    Prior to Admission medications   Medication Sig Start Date End  Date Taking? Authorizing Provider  acetaminophen (TYLENOL) 500 MG tablet Take 500 mg by mouth every 6 (six) hours as needed for mild pain or moderate pain.    [provider]  amoxicillin-clavulanate (AUGMENTIN) 875-125 MG tablet Take 1 tablet by mouth 2 (two) times daily. 10/06/21   [name] T, PA-C  aspirin EC 81 MG tablet Take 81 mg by mouth every morning.    [provider]  atorvastatin (LIPITOR) 20 MG tablet TAKE 1 TABLET ONCE DAILY. 12/30/20   Sherren Mocha, MD  Calcium-Magnesium-Zinc (CAL-MAG-ZINC PO) Take 1 tablet by mouth every morning.    [provider]  Cholecalciferol (VITAMIN D-3) 125 MCG (5000 UT) TABS Take 5,000 Units by mouth every morning.    [provider]  doxycycline (ADOXA) 100 MG tablet Take 1 tablet (100 mg total) by mouth 2 (two) times daily. 10/07/21   [name] T, PA-C   fesoterodine (TOVIAZ) 8 MG TB24 tablet Take 8 mg by mouth every evening.    [provider]  finasteride (PROSCAR) 5 MG tablet Take 1 tablet (5 mg total) by mouth daily. 06/21/20   Charlynne Cousins, MD  furosemide (LASIX) 40 MG tablet Take 1 tablet (40 mg total) by mouth daily. 10/05/21   [name] T, PA-C  isosorbide mononitrate (IMDUR) 60 MG 24 hr tablet Take 2 tablets by mouth daily.    [provider]  LORazepam (ATIVAN) 1 MG tablet Take 1 mg by mouth every evening.    [provider]  metoprolol succinate (TOPROL XL) 25 MG 24 hr tablet Take 0.5 tablets (12.5 mg total) by mouth daily. 10/05/21   [name] T, PA-C  Multiple Vitamins-Minerals (PRESERVISION AREDS) TABS Take 2 tablets by mouth every morning. 03/04/09   [provider]  nitroGLYCERIN (NITROSTAT) 0.4 MG SL tablet Place 0.4 mg under the tongue every 5 (five) minutes as needed for chest pain.    [provider]  polyethylene glycol powder (GLYCOLAX) 17 GM/SCOOP powder Take 1 Container by mouth once.    [provider]  sacubitril-valsartan (ENTRESTO) 24-26 MG Take 1 tablet by mouth 2 (two) times daily. Please keep upcoming appointment for future refills thank you. 07/04/21   Sherren Mocha, MD  spironolactone (ALDACTONE) 25 MG tablet Take 1 tablet (25 mg total) by mouth daily. 10/05/21   [name] T, PA-C  tamsulosin (FLOMAX) 0.4 MG CAPS capsule Take 1 capsule (0.4 mg total) by mouth daily. 06/21/20   Charlynne Cousins, MD  warfarin (COUMADIN) 5 MG tablet Take 0.5 tablets (2.5 mg total) by mouth daily. 06/21/20   Charlynne Cousins, MD    Physical Exam: Vitals:   11/21/21 0830 11/21/21 0900 11/21/21 0901 11/21/21 0930  BP: (!) 146/87 (!) 150/55  (!) 147/62  Pulse: 82 (!) 59 63 (!) 57  Resp: '19 19 17 17  '$ Temp:      TempSrc:      SpO2: 97% 97% 97% 99%  Weight:      Height:       Exam  Constitutional: Elderly male currently in no acute distress Eyes: PERRL,  lids and conjunctivae normal ENMT: Mucous membranes are moist.  Patient is slightly hard of hearing Neck: normal, supple, JVD present Respiratory: Normal respiratory effort with intermittent crackles appreciated. Cardiovascular: irregular irregular without systolic murmur present,  +2 pitting bilateral lower extremity edema. Abdomen: no tenderness, no masses palpated.  Bowel sounds positive.  Musculoskeletal: no clubbing / cyanosis.  Muscle tone. Skin: Venous stasis noted of  the bilateral lower extremities with bandages present of the lower extremity due to prior weeping Neurologic: CN 2-12 grossly intact.   Moves all extremities. Psychiatric: Normal judgment and insight. Alert and oriented x 3. Normal mood.   Data Reviewed:  EKG revealed atrial fibrillation at 67 bpm with ventricular bigeminy and right bundle branch block.  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  Patient presents with complaints of chest pain and discomfort.  Patient with crackles on lung exam, JVD, and 2+ pitting edema.  Chest x-ray noted cardiomegaly with diffuse concern for pulmonary edema with worsening effusions symptomatic.  Last EF 45-50%, inf and post HK, mild LVH, mildly reduced RVSF, severe BAE, mild to mod MR, trivial AI, and RVSP 64 in 06/2021.  Suspect secondary to change sin diuretic from 80 mg daily to 40 mg daily and spironolactone 25 mg to 12.5 mg daily made on 8/23 due to hypotension:.  Patient has been given Lasix 20 mg IV -Admit to a cardiac telemetry bed -Heart failure order set utilized -Strict I&O's and daily weight -Add-on BNP -Continue Lasix 20 mg IV twice daily.  Reassess and adjust diuresis as needed -Continue metoprolol, Entresto -Cardiology formally consulted at family request.  Chest pain elevated troponin CAD Patient presented with complaints of chest pain.  High-sensitivity troponins 31->30.  Patient has had multiple PCI procedures  in the past.  He has known high-grade disease in the RCA and prior PCI attempt were unsuccessful.  Suspect that patient's symptoms are likely related with CHF exacerbation as patient reports pain improving since being given Lasix. -Continue isosorbide mononitrate -Continue to monitor  Permanent atrial fibrillation supratherapeutic INR Patient appears to be in atrial fibrillation, but rate controlled.  INR elevated at 3.2. -Coumadin per pharmacy -Continue metoprolol  -Goal potassium at least 4 and magnesium at least 2.  Essential hypertension Home blood pressure regimen includes furosemide 40 mg daily, isosorbide mononitrate 120 mg daily, Entresto 24-26 mg twice daily, spironolactone 25 mg daily, and metoprolol 12.5 mg daily. -Continued isosorbide mononitrate, Entresto, and metoprolol  Aortic stenosis s/p TAVR Normally functioning during last echocardiogram.  Normocytic anemia Hemoglobin 11.8 g/dL which appears around patient's baseline. -Recheck CBC tomorrow morning  Chronic kidney disease stage IIIb Creatinine 1.15 which appears improved from prior -Continue to monitor kidney function daily while diuresing  Thrombocytopenia Chronic.  Platelet count 122 on admission.  No reports of bleeding.  Likely related to liver cirrhosis as noted on prior CT imaging of the abdomen. -Continue to monitor  Hyperlipidemia -Continue atorvastatin  Anxiety -Continue Ativan as needed  BPH -Continue finasteride and tamsulosin  History of AAA  S/p repair back in [place]:   Code Status: Full Code.  Confirmed with family present at bedside.  Consults: Cardiology  Family Communication: Son, daughter, and daughter-in-law updated at bedside  Severity of Illness: The appropriate patient status for this patient is INPATIENT. Inpatient status is judged to be reasonable and necessary in order to provide the required intensity of service to ensure the patient's safety. The  patient's presenting symptoms, physical exam findings, and initial radiographic and laboratory data in the context of their chronic comorbidities is felt to place them at high risk for further clinical deterioration. Furthermore, it is not anticipated that the patient will be medically stable for discharge from the hospital within 2 midnights of admission.   * I certify that at the point of admission it is my clinical judgment that the patient  will require inpatient hospital care spanning beyond 2 midnights from the point of admission due to high intensity of service, high risk for further deterioration and high frequency of surveillance required.*  Author: Norval Morton, MD 11/21/2021 10:13 AM  For on call review [email or web address].

## 2021-11-21 NOTE — ED Triage Notes (Signed)
Pt arrives via [facility] from [facility]. Per medic report, the pt has been having chest pain since around midnight. He has had tightness. Alert and oriented x 4. 324 asa given en route. IV established in the left ac. He has had frequent PVC/s he had 1- 4 beat run of vtach, En Route  BP 173/80, 96% ra, cbg 177

## 2021-11-22 ENCOUNTER — Telehealth: Payer: Self-pay | Admitting: Cardiovascular Disease

## 2021-11-22 DIAGNOSIS — I25119 Atherosclerotic heart disease of native coronary artery with unspecified angina pectoris: Secondary | ICD-10-CM | POA: Diagnosis not present

## 2021-11-22 DIAGNOSIS — I10 Essential (primary) hypertension: Secondary | ICD-10-CM | POA: Diagnosis not present

## 2021-11-22 DIAGNOSIS — D649 Anemia, unspecified: Secondary | ICD-10-CM | POA: Diagnosis not present

## 2021-11-22 DIAGNOSIS — I5043 Acute on chronic combined systolic (congestive) and diastolic (congestive) heart failure: Secondary | ICD-10-CM | POA: Diagnosis not present

## 2021-11-22 LAB — CBC
HCT: 35.4 % — ABNORMAL LOW (ref 39.0–52.0)
Hemoglobin: 11.2 g/dL — ABNORMAL LOW (ref 13.0–17.0)
MCH: 28.6 pg (ref 26.0–34.0)
MCHC: 31.6 g/dL (ref 30.0–36.0)
MCV: 90.5 fL (ref 80.0–100.0)
Platelets: 125 10*3/uL — ABNORMAL LOW (ref 150–400)
RBC: 3.91 MIL/uL — ABNORMAL LOW (ref 4.22–5.81)
RDW: 16.8 % — ABNORMAL HIGH (ref 11.5–15.5)
WBC: 4.8 10*3/uL (ref 4.0–10.5)
nRBC: 0 % (ref 0.0–0.2)

## 2021-11-22 LAB — BASIC METABOLIC PANEL
Anion gap: 6 (ref 5–15)
BUN: 27 mg/dL — ABNORMAL HIGH (ref 8–23)
CO2: 27 mmol/L (ref 22–32)
Calcium: 8.6 mg/dL — ABNORMAL LOW (ref 8.9–10.3)
Chloride: 105 mmol/L (ref 98–111)
Creatinine, Ser: 1.16 mg/dL (ref 0.61–1.24)
GFR, Estimated: 59 mL/min — ABNORMAL LOW (ref >60)
Glucose, Bld: 104 mg/dL — ABNORMAL HIGH (ref 70–99)
Potassium: 3.9 mmol/L (ref 3.5–5.1)
Sodium: 138 mmol/L (ref 135–145)

## 2021-11-22 LAB — PROTIME-INR
INR: 3.1 — ABNORMAL HIGH (ref 0.8–1.2)
Prothrombin Time: 31.9 seconds — ABNORMAL HIGH (ref 11.4–15.2)

## 2021-11-22 LAB — MAGNESIUM: Magnesium: 2.4 mg/dL (ref 1.7–2.4)

## 2021-11-22 LAB — MRSA NEXT GEN BY PCR, NASAL: MRSA by PCR Next Gen: DETECTED — AB

## 2021-11-22 MED ORDER — POTASSIUM CHLORIDE CRYS ER 10 MEQ PO TBCR
10.0000 meq | EXTENDED_RELEASE_TABLET | Freq: Once | ORAL | Status: AC
  Administered 2021-11-22: 10 meq via ORAL
  Filled 2021-11-22: qty 1

## 2021-11-22 MED ORDER — CHLORHEXIDINE GLUCONATE CLOTH 2 % EX PADS
6.0000 | MEDICATED_PAD | Freq: Every day | CUTANEOUS | Status: DC
  Administered 2021-11-23 – 2021-11-25 (×2): 6 via TOPICAL

## 2021-11-22 MED ORDER — MUPIROCIN 2 % EX OINT
1.0000 | TOPICAL_OINTMENT | Freq: Two times a day (BID) | CUTANEOUS | Status: DC
  Administered 2021-11-23 – 2021-11-25 (×6): 1 via NASAL
  Filled 2021-11-22: qty 22

## 2021-11-22 MED ORDER — FUROSEMIDE 10 MG/ML IJ SOLN
60.0000 mg | Freq: Two times a day (BID) | INTRAMUSCULAR | Status: DC
  Administered 2021-11-22 – 2021-11-23 (×2): 60 mg via INTRAVENOUS
  Filled 2021-11-22 (×3): qty 6

## 2021-11-22 MED ORDER — WARFARIN - PHARMACIST DOSING INPATIENT: Freq: Every day | Status: DC

## 2021-11-22 NOTE — Assessment & Plan Note (Signed)
Patient with no chest pain Continue with statin therapy.

## 2021-11-22 NOTE — Assessment & Plan Note (Signed)
Continue with statin therapy.  ?

## 2021-11-22 NOTE — Assessment & Plan Note (Signed)
CKD stage 3a.  Renal function with serum cr 1,16 with K at 3,9 and serum bicarbonate at 27 Plan to continue diuresis with furosemide and spironolactone Follow up renal function and electrolytes in am.  Avoid hypotension and nephrotoxic medications.

## 2021-11-22 NOTE — Progress Notes (Addendum)
Progress Note   Patient: Tyler Williams UYQ:[id number] DOB: 11-29-1926 DOA: 11/21/2021     1 DOS: the patient was seen and examined on 11/22/2021   Brief Williams course: Tyler Williams was admitted to the [facility] with the working diagnosis of decompensated heart failure.   88 yo male with the past medical history of hypertension, dyslipidemia, atrial fibrillation, AAA repair, aortic stenosis sp TAVR and heart failure who presented with chest pain. On 08/23 his furosemide dose was decreased from 80 to 40 and entresto held due to hypotension, with systolic blood pressure 80 to 100 mmHg. At home patient developed progressive lower extremity edema and 15 lbs weight gain. The night prior to hospitalization patient had chest tightness and noted his legs weeping fluid, EMS was called and he was brought to the ED. On his initial physical examination his blood pressure was 167/69, HR 49 to 82, RR 19 and 02 saturation 97%, lungs with rales bilaterally, heart with S1 and S2 present irregularly irregular, abdomen with no distention and positive lower extremity edema ++.   NA 137, K 4,1 CL 105 bicarbonate at 22, glucose 90 bun 29 cr 1,15  BNP 1,254  High sensitive troponin 31 and 30  Wbc 4,2 hgb 11,8 plt 122  INR 3,2   Chest radiograph with cardiomegaly with bilateral hilar vascular congestion, bilateral interstitial infiltrates with bilateral pleural effusions, more left than right   EKG 67 bpm, right axis deviation, right bundle branch block, atrial fibrillation rhythm with PVCs, no significant ST segment or T wave changes.   Patient was placed on IV furosemide for diuresis.   Assessment and Plan: * Acute on chronic combined systolic and diastolic CHF (congestive heart failure) (HCC) Echocardiogram with mildly reduced LV systolic function 45 to 63%, mild LVH, severe hypokinesis of the left ventricular, basal mid inferior wall and inferolateral wall. RV systolic function with mild reduction. Mild  enlarged RV cavity, RVSP 64.0 with severe elevation of pulmonary systolic pressure. Severe dilatation of left and right atriums. Moderate TR.  SP TAVR.  Continue patient with hypervolemia Systolic blood pressure 875 to 150 mmHg  Continue diuresis with furosemide, will increase dose to 60 mg IV q12 hrs Continue with metoprolol, entresto, isosorbide, and spironolactone.  Patient will benefit from SGLT 2 inh.   Coronary artery disease involving native coronary artery with angina pectoris Tyler Williams) Patient with no chest pain Continue with statin therapy.   Permanent atrial fibrillation (HCC) Continue rate control with metoprolol and anticoagulation with warfarin  Follow up INR per pharmacy protocol.   Essential hypertension Continue blood pressure control with entresto and metoprolol, diuresis with furosemide and spironolactone. Continue with isosorbide.   Normocytic anemia Thrombocytopenia  hgb and platelet stable Follow up on iron stores.   Chronic kidney disease, stage III (moderate) (HCC) CKD stage 3a.  Renal function with serum cr 1,16 with K at 3,9 and serum bicarbonate at 27 Plan to continue diuresis with furosemide and spironolactone Follow up renal function and electrolytes in am.  Avoid hypotension and nephrotoxic medications.   HLD (hyperlipidemia) Continue with statin therapy.   Anxiety disorder Continue with as needed lorazepam.  Continue neuro checks per unit protocol.  PT and OT   BPH (benign prostatic hyperplasia) No clinical signs of urinary retention.  Continue with finasteride.         Subjective: patient with improvement in dyspnea and edema but not back to baseline, no chest pain   Physical Exam: Vitals:   11/22/21 0931 11/22/21 1100 11/22/21  1300 11/22/21 1500  BP:  (!) 151/53 (!) 140/60 (!) 144/56  Pulse:  64 60 70  Resp:  18    Temp: 97.6 F (36.4 C)     TempSrc: Oral     SpO2:  100% 98% 99%  Weight:      Height:       Neurology awake  and alert ENT with mild pallor Cardiovascular with S1 and S2 present and rhythmic with no gallops, positive systolic murmur at the left lower sternal border,  Positive JVD Positive lower extremity edema ++ pitting Respiratory with rales at bases with no wheezing Abdomen with no distention  Data Reviewed:    Family Communication: no family at the bedside   Disposition: Status is: Inpatient Remains inpatient appropriate because: heart failure   Planned Discharge Destination: Home     Author: Tawni Millers, MD 11/22/2021 3:10 PM  For on call review [email or web address].

## 2021-11-22 NOTE — Telephone Encounter (Signed)
Returned call to pt's daughter who states the patient is not happy or comfortable and she was hoping Dr Burt Knack could see him and try to get him a bed faster. Advised her that Burt Knack is not there today and has already left clinic and that bed placement is decided based on acuity and not in Dr Antionette Char hands. I apologized that I couldn't be more helpful, she states she understands.

## 2021-11-22 NOTE — Assessment & Plan Note (Signed)
Thrombocytopenia  hgb and platelet stable Follow up on iron stores.

## 2021-11-22 NOTE — Progress Notes (Addendum)
Progress Note  Patient Name: Tyler Williams Date of Encounter: 11/22/2021  Primary Cardiologist: Sherren Mocha, MD  Subjective   Feeling much better. Less SOB. No recurrent chest tightness. Reports good UOP. Some cough.  Inpatient Medications    Scheduled Meds:  atorvastatin  20 mg Oral QPM   fesoterodine  8 mg Oral QPM   finasteride  5 mg Oral Daily   furosemide  20 mg Intravenous BID   isosorbide mononitrate  120 mg Oral Daily   metoprolol succinate  12.5 mg Oral Daily   sacubitril-valsartan  1 tablet Oral BID   spironolactone  25 mg Oral Daily   tamsulosin  0.4 mg Oral Daily   Continuous Infusions:  PRN Meds: acetaminophen **OR** acetaminophen, albuterol, LORazepam, ondansetron **OR** ondansetron (ZOFRAN) IV   Vital Signs    Vitals:   11/22/21 0600 11/22/21 0700 11/22/21 0900 11/22/21 0931  BP: (!) 157/64 (!) 147/61 (!) 153/63   Pulse: 62 73 69   Resp: '20 17 19   '$ Temp:    97.6 F (36.4 C)  TempSrc:    Oral  SpO2: 94% 98% 97%   Weight:      Height:        Intake/Output Summary (Last 24 hours) at 11/22/2021 0938 Last data filed at 11/21/2021 1218 Gross per 24 hour  Intake --  Output 1000 ml  Net -1000 ml      11/21/2021    6:55 AM 10/10/2021    1:38 PM 10/05/2021    1:20 PM  Last 3 Weights  Weight (lbs) 180 lb 176 lb 171 lb  Weight (kg) 81.647 kg 79.833 kg 77.565 kg     Telemetry    Atrial fib, occasional PVCs - some nocturnal bradycardia 40s but otherwise day rates well controlled - Personally Reviewed  Physical Exam   GEN: No acute distress. Elderly HEENT: Normocephalic, atraumatic, sclera non-icteric. Neck: No JVD or bruits. Cardiac: irregularly irregular, rate controlled, no murmurs, rubs, or gallops.  Respiratory: Mildly decreased BS to bases bilaterally without wheezing, rales or rhonchi. Breathing is unlabored. GI: Soft, nontender, non-distended, BS +x 4. MS: no deformity. Extremities: No clubbing or cyanosis. 1+ BLE edema with  chronic venous stasis changes but improving Neuro:  AAOx3. Follows commands. Psych:  Responds to questions appropriately with a normal affect.  Labs    High Sensitivity Troponin:   Recent Labs  Lab 11/21/21 0658 11/21/21 0913  TROPONINIHS 31* 30*      Cardiac EnzymesNo results for input(s): "TROPONINI" in the last 168 hours. No results for input(s): "TROPIPOC" in the last 168 hours.   Chemistry Recent Labs  Lab 11/21/21 0658 11/22/21 0340  NA 137 138  K 4.1 3.9  CL 105 105  CO2 22 27  GLUCOSE 90 104*  BUN 29* 27*  CREATININE 1.15 1.16  CALCIUM 9.3 8.6*  PROT 6.5  --   ALBUMIN 3.2*  --   AST 24  --   ALT 14  --   ALKPHOS 84  --   BILITOT 1.3*  --   GFRNONAA 59* 59*  ANIONGAP 10 6     Hematology Recent Labs  Lab 11/21/21 0658 11/22/21 0340  WBC 4.2 4.8  RBC 3.98* 3.91*  HGB 11.8* 11.2*  HCT 35.6* 35.4*  MCV 89.4 90.5  MCH 29.6 28.6  MCHC 33.1 31.6  RDW 16.7* 16.8*  PLT 122* 125*    BNP Recent Labs  Lab 11/21/21 0658  BNP 1,254.9*     DDimer No results  for input(s): "DDIMER" in the last 168 hours.   Radiology    DG Chest 2 View  Result Date: 11/21/2021 CLINICAL DATA:  Chest pain x1 week EXAM: CHEST - 2 VIEW COMPARISON:  Previous studies including the examination of 10/05/2021 FINDINGS: Transverse diameter of heart is increased. Central pulmonary vessels are more prominent. Increased interstitial and alveolar markings are seen in parahilar regions and lower lung fields. Small to moderate bilateral pleural effusions are seen, more so on the left side. There is prosthetic stent in the region of aortic valve. There is no pneumothorax. IMPRESSION: Cardiomegaly. Central pulmonary vessels are prominent suggesting CHF. Increased markings in the lower lung fields may suggest pulmonary edema or pneumonia. Small to moderate bilateral pleural effusions, more so on the left side with interval increase. Electronically Signed   By: Elmer Picker M.D.   On:  11/21/2021 08:00    Cardiac Studies   06/2021    1. Left ventricular ejection fraction, by estimation, is 45 to 50%. The  left ventricle has mildly decreased function. The left ventricle  demonstrates regional wall motion abnormalities (see scoring  diagram/findings for description). There is mild  concentric left ventricular hypertrophy. Left ventricular diastolic  function could not be evaluated. There is severe hypokinesis of the left  ventricular, basal-mid inferior wall and inferolateral wall.   2. Right ventricular systolic function is mildly reduced. The right  ventricular size is mildly enlarged. There is severely elevated pulmonary  artery systolic pressure.   3. Left atrial size was severely dilated.   4. Right atrial size was severely dilated.   5. The mitral valve is normal in structure. Mild to moderate mitral valve  regurgitation. No evidence of mitral stenosis. Moderate mitral annular  calcification.   6. Tricuspid valve regurgitation is moderate.   7. There is trivial perivalvular leak at the posterior annulus. The  aortic valve has been repaired/replaced. Aortic valve regurgitation is  trivial. There is a 29 mm Sapien prosthetic (TAVR) valve present in the  aortic position. Procedure Date: 2018.  Aortic valve mean gradient measures 6.0 mmHg. Aortic valve Vmax measures  1.64 m/s. Aortic valve acceleration time measures 88 msec.   8. The inferior vena cava is dilated in size with <50% respiratory  variability, suggesting right atrial pressure of 15 mmHg.   Comparison(s): The left ventricular function is unchanged. The left  ventricular wall motion abnormality is unchanged. TAVR parameters are  unchanged. The estimated systolic PA pressure is higher.   Patient Profile     88 y.o. male with  permanent atrial fibrillation, CAD s/p POBA 1980s, 1990s (details unclear), known stenotic RCA (previous unsuccessful intervention), chronic HFmrEF, severe AS s/p TAVR 06/2016,  mild-moderate MR by echo 06/2021, severe pulmonary HTN, PVD with AAA s/p repair 1993, bilateral renal artery atherosclerosis, SMA stenosis with collateralization, mild carotid artery disease (1-39% 2018), historical PVCs on telemetry, venous insufficiency, probable CKD stage 3a, urinary rentention, aortic atherosclerosis, very remote PE (1964), arthritis, GERD, macular degeneration, RBBB, cirrhotic morphology on liver on CT, colon mass by CT 2018, remote SSS. Admitted with worsening SOB, edema, weight gain and chest tightness.  Assessment & Plan    1. Chest tightness with suspected acute on chronic HFmrEF, known severe pulmonary HTN - suspect potentially driven by hypervolemia with evidence of CHF and pleural effusions, patient already reports excellent UOP and improvement of symptoms with dose of IV Lasix - would continue as he is diuresing well - I/O's and weights not well tracked in ER  setting but has urine canister nearing full after AM dose - continue home Entresto, metoprolol, Imdur, spironolactone and follow renal function -> will review advancement of GDMT with MD - had to be decreased in 09/2021 due to infection / hypotension issues but now trending hypertensive - some suggestion of ?PNA on CXR - will defer to primary team for input   2. Known CAD s/p remote POBAs, stenotic RCA treated medically - hsTroponin low/flat, not specifically suggestive of ACS - felt demand due to CHF - not on ASA with concomitant warfarin - continue BB, statin   3. Permanent atrial fib with occasional PVCs (known) - on chronic warfarin for anticoagulation - will reorder for pharm to follow, INR supratherapeutic - continue rate control strategy with metoprolol - Mg wnl, K 3.9 -> will rx 68mq KCl   4. PVD -follow clinically as OP f/u   5. Prior abnormal liver imaging - prior CTs have demonstrated possible cirrhotic changes of liver as well as 4.6 x 5.6 x 3.7 cm masslike lesion in the cecum in 2018 adjacent to  the ileocecal valve may represent a large polyp or colonic neoplasm - I do not see specific prior follow-up though CT 2019 by PCP did not mention this finding - consider OP GI follow-up   6. Essential HTN -  manage in context above   7. Severe AS s/p TAVR 2018, mild-moderate MR - continue volume control as above- - recent echo 06/2021, will defer to MD if repeat needed   8. Anemia/thrombocytopenia - similar to prior values, follow    For questions or updates, please contact [place] consult [email or web address] for contact info under Cardiology/STEMI.  Signed, DCharlie Pitter PA-C 11/22/2021, 9:38 AM

## 2021-11-22 NOTE — Assessment & Plan Note (Signed)
Continue with as needed lorazepam.  Continue neuro checks per unit protocol.  PT and OT

## 2021-11-22 NOTE — Assessment & Plan Note (Signed)
Continue blood pressure control with entresto and metoprolol, diuresis with furosemide and spironolactone. Continue with isosorbide.

## 2021-11-22 NOTE — Plan of Care (Signed)
°  Problem: Education: °Goal: Ability to demonstrate management of disease process will improve °Outcome: Progressing °  °Problem: Education: °Goal: Ability to verbalize understanding of medication therapies will improve °Outcome: Progressing °  °Problem: Activity: °Goal: Capacity to carry out activities will improve °Outcome: Progressing °  °

## 2021-11-22 NOTE — Evaluation (Signed)
Physical Therapy Evaluation Patient Details Name: Tyler Williams MRN: [id number] DOB: [birth year] Today's Date: 11/22/2021  History of Present Illness  88 yo male from [place] was admitted 10/9 for reflux, chest tightness and substernal chest pain.  Has recently been hypotensive, light headed upon standing today.  LE edema ongoing, elevated troponin, CHF exacerbation.  PMHx:   afib, AAA s/p repair, arthritis, CAD, HTN, PVD, s/p TAVR (2018), and multiple joint replacements, aortic stenosis, anemia,  Clinical Impression  Pt was seen in ED for evaluation and note his changes in mobility are significant, and has a much higher fall risk.  Current need is for inpt care in SNF, but talked with pt about trying to get him home if possible.  Currently recommending SNF due to change in balance, inability to manage gait, low BP diastolically with light headed feelings and loss of balance, and low endurance generally.  Follow acutely for PT goals and have been able to see with his son to help with keeping family in the communication loop.       Recommendations for follow up therapy are one component of a multi-disciplinary discharge planning process, led by the attending physician.  Recommendations may be updated based on patient status, additional functional criteria and insurance authorization.  Follow Up Recommendations Skilled nursing-short term rehab (<3 hours/day) Can patient physically be transported by private vehicle: No    Assistance Recommended at Discharge Frequent or constant Supervision/Assistance  Patient can return home with the following  A lot of help with walking and/or transfers;A little help with bathing/dressing/bathroom;Assistance with cooking/housework;Assist for transportation;Help with stairs or ramp for entrance    Equipment Recommendations Rolling walker (2 wheels)  Recommendations for Other Services       Functional Status Assessment Patient has had a  recent decline in their functional status and demonstrates the ability to make significant improvements in function in a reasonable and predictable amount of time.     Precautions / Restrictions Precautions Precautions: Fall Precaution Comments: light headed upon standing with BP drop Restrictions Weight Bearing Restrictions: No      Mobility  Bed Mobility Overal bed mobility: Needs Assistance Bed Mobility: Sidelying to Sit, Sit to Sidelying   Sidelying to sit: Mod assist     Sit to sidelying: Mod assist      Transfers Overall transfer level: Needs assistance Equipment used: Rolling walker (2 wheels), 1 person hand held assist               General transfer comment: assist to support pt due to sudden onset of light headed feelings and diastolic BP drop    Ambulation/Gait Ambulation/Gait assistance: Min guard Gait Distance (Feet): 4 Feet Assistive device: Rolling walker (2 wheels), 1 person hand held assist Gait Pattern/deviations: Step-to pattern, Decreased stride length Gait velocity: reduced Gait velocity interpretation: <1.31 ft/sec, indicative of household ambulator Pre-gait activities: standing balance and BP ck General Gait Details: pt sidestepped on side of bed due to BP being low and avoiding a walk from the bedside  Stairs            Wheelchair Mobility    Modified Rankin (Stroke Patients Only)       Balance Overall balance assessment: Needs assistance Sitting-balance support: Feet supported, Single extremity supported Sitting balance-Leahy Scale: Fair     Standing balance support: Bilateral upper extremity supported, During functional activity Standing balance-Leahy Scale: Poor  Pertinent Vitals/Pain Pain Assessment Pain Assessment: Faces Faces Pain Scale: Hurts a little bit Pain Location: general tightness Pain Descriptors / Indicators: Guarding Pain Intervention(s): Repositioned,  Monitored during session    Home Living Family/patient expects to be discharged to:: Skilled nursing facility                   Additional Comments: lives in accessible environment with rollator to walk    Prior Function Prior Level of Function : Needs assist       Physical Assist : Mobility (physical) Mobility (physical): Gait   Mobility Comments: I with rollator typically ADLs Comments: has staff assist for bathing and dressing     Hand Dominance   Dominant Hand: Right    Extremity/Trunk Assessment   Upper Extremity Assessment Upper Extremity Assessment: Overall WFL for tasks assessed    Lower Extremity Assessment Lower Extremity Assessment: Generalized weakness    Cervical / Trunk Assessment Cervical / Trunk Assessment: Kyphotic (mild)  Communication   Communication: No difficulties (mild HOH)  Cognition Arousal/Alertness: Awake/alert Behavior During Therapy: WFL for tasks assessed/performed Overall Cognitive Status: History of cognitive impairments - at baseline                                 General Comments: has some history challenges but son present to give details        General Comments General comments (skin integrity, edema, etc.): pt was noted to be a bit light headed sitting and was 130/72 BP; standing down to 125/50    Exercises     Assessment/Plan    PT Assessment Patient needs continued PT services  PT Problem List Decreased strength;Decreased activity tolerance;Decreased balance;Decreased mobility;Decreased safety awareness;Cardiopulmonary status limiting activity       PT Treatment Interventions DME instruction;Gait training;Functional mobility training;Therapeutic activities;Therapeutic exercise;Balance training;Neuromuscular re-education;Patient/family education    PT Goals (Current goals can be found in the Care Plan section)  Acute Rehab PT Goals Patient Stated Goal: to go home and live at [facility] PT Goal  Formulation: With patient/family Time For Goal Achievement: 12/06/21 Potential to Achieve Goals: Good    Frequency Min 3X/week     Co-evaluation               AM-PAC PT "6 Clicks" Mobility  Outcome Measure Help needed turning from your back to your side while in a flat bed without using bedrails?: A Lot Help needed moving from lying on your back to sitting on the side of a flat bed without using bedrails?: A Lot Help needed moving to and from a bed to a chair (including a wheelchair)?: A Lot Help needed standing up from a chair using your arms (e.g., wheelchair or bedside chair)?: A Little Help needed to walk in hospital room?: A Little Help needed climbing 3-5 steps with a railing? : Total 6 Click Score: 13    End of Session Equipment Utilized During Treatment: Gait belt Activity Tolerance: Patient limited by fatigue;Treatment limited secondary to medical complications (Comment) Patient left: in bed;with call bell/phone within reach;with family/visitor present Nurse Communication: Mobility status PT Visit Diagnosis: Unsteadiness on feet (R26.81);Muscle weakness (generalized) (M62.81)    Time: 6295-2841 PT Time Calculation (min) (ACUTE ONLY): 35 min   Charges:   PT Evaluation $PT Eval Moderate Complexity: 1 Mod PT Treatments $Therapeutic Activity: 8-22 mins       Ramond Dial 11/22/2021, 2:13 PM  Mee Hives, PT  PhD Acute Rehab Dept. Number: [place]

## 2021-11-22 NOTE — Telephone Encounter (Signed)
Pt's daughter is calling to make Dr. Burt Knack aware that pt is admitted currently at [facility] and he has been in ED since last night due to no bed availability. Requesting call back.

## 2021-11-22 NOTE — Hospital Course (Signed)
Tyler Williams was admitted to the hospital with the working diagnosis of decompensated heart failure.   88 yo male with the past medical history of hypertension, dyslipidemia, atrial fibrillation, AAA repair, aortic stenosis sp TAVR and heart failure who presented with chest pain. On 08/23 his furosemide dose was decreased from 80 to 40 and entresto held due to hypotension, with systolic blood pressure 80 to 100 mmHg. At home patient developed progressive lower extremity edema and 15 lbs weight gain. The night prior to hospitalization patient had chest tightness and noted his legs weeping fluid, EMS was called and he was brought to the ED. On his initial physical examination his blood pressure was 167/69, HR 49 to 82, RR 19 and 02 saturation 97%, lungs with rales bilaterally, heart with S1 and S2 present irregularly irregular, abdomen with no distention and positive lower extremity edema ++.   NA 137, K 4,1 CL 105 bicarbonate at 22, glucose 90 bun 29 cr 1,15  BNP 1,254  High sensitive troponin 31 and 30  Wbc 4,2 hgb 11,8 plt 122  INR 3,2   Chest radiograph with cardiomegaly with bilateral hilar vascular congestion, bilateral interstitial infiltrates with bilateral pleural effusions, more left than right   EKG 67 bpm, right axis deviation, right bundle branch block, atrial fibrillation rhythm with PVCs, no significant ST segment or T wave changes.   Patient was placed on IV furosemide for diuresis.

## 2021-11-22 NOTE — Assessment & Plan Note (Addendum)
No clinical signs of urinary retention.  Continue with finasteride.

## 2021-11-22 NOTE — Assessment & Plan Note (Signed)
Continue rate control with metoprolol and anticoagulation with warfarin  Follow up INR per pharmacy protocol.

## 2021-11-22 NOTE — Progress Notes (Signed)
ANTICOAGULATION CONSULT NOTE - Initial Consult  Pharmacy Consult for warfarin  Indication: atrial fibrillation  No Known Allergies  Patient Measurements: Height: '5\' 8"'$  (172.7 cm) Weight: 81.6 kg (180 lb) IBW/kg (Calculated) : 68.4  Vital Signs: Temp: 97.6 F (36.4 C) (10/10 0931) Temp Source: Oral (10/10 0931) BP: 153/63 (10/10 0900) Pulse Rate: 69 (10/10 0900)  Labs: Recent Labs    11/21/21 0658 11/21/21 0913 11/22/21 0340  HGB 11.8*  --  11.2*  HCT 35.6*  --  35.4*  PLT 122*  --  125*  LABPROT 32.7*  --  31.9*  INR 3.2*  --  3.1*  CREATININE 1.15  --  1.16  TROPONINIHS 31* 30*  --     Estimated Creatinine Clearance: 38.5 mL/min (by C-G formula based on SCr of 1.16 mg/dL).   Medical History: Past Medical History:  Diagnosis Date   AAA (abdominal aortic aneurysm) ([place])    s/p repair   Aortic atherosclerosis (HCC)    Arthritis    knees   CAD (coronary artery disease)    a. s/p POBA 1980s, 1990s (details unclear). b. known stenotic RCA (previous unsuccessful intervention)   Cancer ([place])    skin cancer removed, right hand, left leg, chest   Carotid artery disease (HCC)    Chronic anticoagulation    Coumadin   Chronic kidney disease, stage 3a (HCC)    Cirrhosis of liver ([place])    by imaging   Coronary artery disease    GERD (gastroesophageal reflux disease)    Heart failure with mildly reduced ejection fraction (HFmrEF) ([place])    History of pulmonary embolism    1964 related to dislocated hip on right    History of skin cancer    Hypercholesteremia    Hypertension    Incidental cecal mass noted on CT imaging 05/24/2016   **An incidental finding of potential clinical significance has been found. 4.6 x 5.6 x 3.7 cm masslike lesion in the cecum adjacent to the ileocecal valve may represent a large polyp or colonic neoplasm. Correlation with nonemergent colonoscopy is strongly recommended in the near future to better evaluate this finding.**   Macular  degeneration    eye injections   Microscopic hematuria    Followed by urology   Permanent atrial fibrillation ([place])    Pulmonary hypertension (HCC)    PVD (peripheral vascular disease) ([place])    Bilateral renal artery atherosclerosis, SMA stenosis with collateralization   RBBB    Chronic   S/P TAVR (transcatheter aortic valve replacement) 06/13/2016   29 mm Edwards Sapien 3 transcatheter heart valve placed via percutaneous right transfemoral approach   Severe aortic stenosis    s/p TAVR   SSS (sick sinus syndrome) ([place])    Assessment: Patient is a 88 y.o male admitted with CC of chest pain. History of permanent atrial fibrillation for which he is on warfarin. Home warfarin regimen is 7.'5mg'$  on Monday and '5mg'$  all other days. Last dose was on 11/20/2021 in the morning per med rec.   Admission INR was 3.2, AM INR on 10/10 elevated at 3.1. Hemoglobin stable at 11.2.   Goal of Therapy:  INR 2-3 Monitor platelets by anticoagulation protocol: Yes   Plan:  Will hold warfarin for today as INR is supratherapeutic at 3.1.  Monitor INR daily.  Follow H & H.   Ventura Sellers 11/22/2021,9:38 AM

## 2021-11-22 NOTE — Assessment & Plan Note (Addendum)
Echocardiogram with mildly reduced LV systolic function 45 to 29%, mild LVH, severe hypokinesis of the left ventricular, basal mid inferior wall and inferolateral wall. RV systolic function with mild reduction. Mild enlarged RV cavity, RVSP 64.0 with severe elevation of pulmonary systolic pressure. Severe dilatation of left and right atriums. Moderate TR.  SP TAVR.  Continue patient with hypervolemia Systolic blood pressure 290 to 150 mmHg  Continue diuresis with furosemide, will increase dose to 60 mg IV q12 hrs Continue with metoprolol, entresto, isosorbide, and spironolactone.  Patient will benefit from SGLT 2 inh.

## 2021-11-23 DIAGNOSIS — I4821 Permanent atrial fibrillation: Secondary | ICD-10-CM | POA: Diagnosis not present

## 2021-11-23 DIAGNOSIS — I5043 Acute on chronic combined systolic (congestive) and diastolic (congestive) heart failure: Secondary | ICD-10-CM | POA: Diagnosis not present

## 2021-11-23 LAB — URINALYSIS, ROUTINE W REFLEX MICROSCOPIC
Bacteria, UA: NONE SEEN
Bilirubin Urine: NEGATIVE
Glucose, UA: NEGATIVE mg/dL
Ketones, ur: NEGATIVE mg/dL
Leukocytes,Ua: NEGATIVE
Nitrite: NEGATIVE
Protein, ur: NEGATIVE mg/dL
RBC / HPF: >50 RBC/hpf — ABNORMAL HIGH (ref 0–5)
Specific Gravity, Urine: 1.008 (ref 1.005–1.030)
pH: 5 (ref 5.0–8.0)

## 2021-11-23 LAB — BASIC METABOLIC PANEL
Anion gap: 10 (ref 5–15)
BUN: 23 mg/dL (ref 8–23)
CO2: 26 mmol/L (ref 22–32)
Calcium: 8.9 mg/dL (ref 8.9–10.3)
Chloride: 102 mmol/L (ref 98–111)
Creatinine, Ser: 1.07 mg/dL (ref 0.61–1.24)
GFR, Estimated: >60 mL/min (ref >60)
Glucose, Bld: 105 mg/dL — ABNORMAL HIGH (ref 70–99)
Potassium: 3.8 mmol/L (ref 3.5–5.1)
Sodium: 138 mmol/L (ref 135–145)

## 2021-11-23 LAB — CBC
HCT: 36 % — ABNORMAL LOW (ref 39.0–52.0)
Hemoglobin: 12 g/dL — ABNORMAL LOW (ref 13.0–17.0)
MCH: 28.9 pg (ref 26.0–34.0)
MCHC: 33.3 g/dL (ref 30.0–36.0)
MCV: 86.7 fL (ref 80.0–100.0)
Platelets: 128 10*3/uL — ABNORMAL LOW (ref 150–400)
RBC: 4.15 MIL/uL — ABNORMAL LOW (ref 4.22–5.81)
RDW: 16.8 % — ABNORMAL HIGH (ref 11.5–15.5)
WBC: 5.4 10*3/uL (ref 4.0–10.5)
nRBC: 0 % (ref 0.0–0.2)

## 2021-11-23 LAB — PROTIME-INR
INR: 2.4 — ABNORMAL HIGH (ref 0.8–1.2)
Prothrombin Time: 26 seconds — ABNORMAL HIGH (ref 11.4–15.2)

## 2021-11-23 LAB — MAGNESIUM: Magnesium: 2.3 mg/dL (ref 1.7–2.4)

## 2021-11-23 MED ORDER — POTASSIUM CHLORIDE 20 MEQ PO PACK
20.0000 meq | PACK | Freq: Once | ORAL | Status: AC
  Administered 2021-11-23: 20 meq via ORAL
  Filled 2021-11-23: qty 1

## 2021-11-23 MED ORDER — WARFARIN SODIUM 5 MG PO TABS
5.0000 mg | ORAL_TABLET | Freq: Once | ORAL | Status: AC
  Administered 2021-11-23: 5 mg via ORAL
  Filled 2021-11-23: qty 1

## 2021-11-23 NOTE — Progress Notes (Signed)
ANTICOAGULATION CONSULT NOTE  Pharmacy Consult:  Coumadin Indication: atrial fibrillation  No Known Allergies  Patient Measurements: Height: '5\' 8"'$  (172.7 cm) Weight: 78.2 kg (172 lb 6.4 oz) IBW/kg (Calculated) : 68.4  Vital Signs: Temp: 98.1 F (36.7 C) (10/11 0345) Temp Source: Oral (10/11 0345) BP: 145/61 (10/11 0345) Pulse Rate: 77 (10/11 0345)  Labs: Recent Labs    11/21/21 0658 11/21/21 0913 11/22/21 0340 11/23/21 0455  HGB 11.8*  --  11.2* 12.0*  HCT 35.6*  --  35.4* 36.0*  PLT 122*  --  125* 128*  LABPROT 32.7*  --  31.9* 26.0*  INR 3.2*  --  3.1* 2.4*  CREATININE 1.15  --  1.16 1.07  TROPONINIHS 31* 30*  --   --      Estimated Creatinine Clearance: 41.7 mL/min (by C-G formula based on SCr of 1.07 mg/dL).  Assessment: Patient is a 88 y.o male admitted with CC of chest pain. History of permanent atrial fibrillation for which he is on warfarin. Home warfarin regimen is 7.'5mg'$  on Monday and '5mg'$  all other days. Last dose was on 11/20/2021 in the morning per med rec.   INR down to therapeutic range at 2.4.  No bleeding reported.  Goal of Therapy:  INR 2-3 Monitor platelets by anticoagulation protocol: Yes   Plan:  Coumadin '5mg'$  PO today Daily PT / INR  Gwenna Fuston D. Mina Marble, PharmD, BCPS, [place] 11/23/2021, 7:16 AM

## 2021-11-23 NOTE — TOC Initial Note (Addendum)
Transition of Care [facility]) - Initial/Assessment Note    Patient Details  Name: Tyler Williams MRN: [id number] Date of Birth: January 07, 1927  Transition of Care [facility]) CM/SW Contact:    Bethann Berkshire, [place] Phone Number: 11/23/2021, 1:16 PM  Clinical Narrative:                  CSW met with pt and pt's family(Son [name] and Daughter Carla Drape) bedside to discuss snf recommendation. Pt is from [facility]. CSW explains PT recommendation for SNF; explained ALF level of care vs SNF level and that pt is being recommended for a higher level than ALF can provide. Pt states he does not want to go to SNF and wants to return to [facility]. Son and daughter are interested in pt returning to [facility] if possible; son states he could pay for a caregiver there to offer more support. They would like CSW to discuss with [facility] prior to beginning SNF work up. They were wanting medical update as well as update on potential DC date. CSW agreed to notify MD. MD notified via secure chat.   CSW called and left message with [facility] requesting return call.   1455: CSW called [facility] again and is transferred. Staff person picked up the phone and explained they need to call CSW back. CSW provided callback number.  Expected Discharge Plan: Skilled Nursing Facility Barriers to Discharge: Continued Medical Work up   Patient Goals and CMS Choice        Expected Discharge Plan and Services Expected Discharge Plan: [place]       Living arrangements for the past 2 months: [place]                                      Prior Living Arrangements/Services Living arrangements for the past 2 months: [place] Lives with:: Facility Resident Patient language and need for interpreter reviewed:: Yes        Need for Family Participation in Patient Care: Yes (Comment) Care giver support system in place?: Yes (comment)   Criminal Activity/Legal  Involvement Pertinent to Current Situation/Hospitalization: No - Comment as needed  Activities of Daily Living      Permission Sought/Granted   Permission granted to share information with : Yes, Verbal Permission Granted  Share Information with NAME: Exavior, Kimmons (Son)   [phone] (Mobile           Emotional Assessment Appearance:: Appears stated age Attitude/Demeanor/Rapport: Apprehensive Affect (typically observed): Appropriate Orientation: : Oriented to Self, Oriented to Place, Oriented to  Time, Oriented to Situation Alcohol / Substance Use: Not Applicable Psych Involvement: No (comment)  Admission diagnosis:  Pleural effusion [J90] Chest pain [R07.9] Acute on chronic combined systolic and diastolic CHF (congestive heart failure) ([place]) [I50.43] Chest pain, unspecified type [R07.9] Patient Active Problem List   Diagnosis Date Noted   Chest pain 11/21/2021   Elevated troponin 11/21/2021   Normocytic anemia 11/21/2021   Chronic kidney disease, stage III (moderate) ([place]) 11/21/2021   Thrombocytopenia ([place]) 11/21/2021   BPH (benign prostatic hyperplasia) 11/21/2021   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) 10/05/2021   Hypotension 10/05/2021   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   Pain of left hand 08/12/2020   Abrasion of unspecified back wall of thorax, initial encounter 07/23/2020   Bite of nonvenomous arthropod 07/23/2020   Fall 07/23/2020   Hearing loss 07/23/2020  Laceration without foreign body of right elbow, initial encounter 07/23/2020   Influenza A    Gross hematuria    DCM (dilated cardiomyopathy) ([place])    Chronic atrial fibrillation (HCC)    Congestive heart failure ([place]) 06/12/2020   Dyspnea 04/24/2019   Chronic obstructive pulmonary disease ([place]) 06/10/2018   Hardening of the aorta (main artery of the heart) ([place]) 06/10/2018   Chronic kidney disease, stage 2 (mild) 04/24/2018   Hypertensive heart and chronic kidney disease with heart failure  and stage 1 through stage 4 chronic kidney disease, or unspecified chronic kidney disease ([place]) 04/24/2018   Aorta aneurysm ([place]) 02/14/2018   Cirrhosis of liver ([place]) 02/08/2018   Constipation 01/15/2018   Acute on chronic combined systolic and diastolic CHF (congestive heart failure) ([place]) 09/25/2017   Precordial chest pain 08/28/2017   HLD (hyperlipidemia) 08/28/2017   Anticoagulated on Coumadin 08/28/2017   HTN (hypertension) 08/28/2017   S/P TAVR (transcatheter aortic valve replacement)    Presence of prosthetic heart valve 09/06/2016   S/P TAVR (transcatheter aortic valve replacement) 06/13/2016   Incidental cecal mass noted on CT imaging 05/24/2016   Inguinal hernia 02/24/2016   Exudative age-related macular 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 ([place]) 12/22/2014   Non-thrombocytopenic purpura ([place]) 08/19/2014   Encounter for therapeutic drug monitoring 03/20/2013   Pseudophakia of both eyes 03/19/2013   Macular degeneration 03/19/2013   Cholecystitis, acute 12/06/2012   Chest pain, atypical 12/03/2012   Hematuria, microscopic 12/03/2012   Anxiety disorder 06/20/2012   Long term (current) use of anticoagulants 06/20/2012   Cystoid macular edema 05/14/2012   Overweight (BMI 25.0-29.9) 01/23/2012   Hyponatremia 01/23/2012   S/P right UKR 01/22/2012   Carotid artery bruit 11/21/2011   Carotid bruit 11/21/2011   Presence of intraocular lens 02/16/2011   Osteoarthritis 07/05/2010   EDEMA 07/14/2009   Essential hypertension 01/27/2009   HYPERCHOLESTEROLEMIA  IIA 10/21/2008   Coronary artery disease involving native coronary artery with angina pectoris ([place]) 10/21/2008   Permanent atrial fibrillation ([place]) 10/21/2008   PCP:  Burnard Bunting, MD Pharmacy:   [place], [place] - [address] [place] [address] Phone: [phone] Fax: [phone]     Social  Determinants of Health (SDOH) Interventions    Readmission Risk Interventions     No data to display

## 2021-11-23 NOTE — Progress Notes (Signed)
Patient unable to receive IV Lasix '60mg'$  evening dose due to BP is 99/38 MAP 55 Notified Fanny Bien MD Response: ok to hold lasix

## 2021-11-23 NOTE — Plan of Care (Signed)
  Problem: Education: Goal: Ability to demonstrate management of disease process will improve Outcome: Progressing   Problem: Education: Goal: Ability to verbalize understanding of medication therapies will improve Outcome: Progressing   Problem: Activity: Goal: Capacity to carry out activities will improve Outcome: Progressing   Problem: Education: Goal: Knowledge of General Education information will improve Description: Including pain rating scale, medication(s)/side effects and non-pharmacologic comfort measures Outcome: Progressing   Problem: Activity: Goal: Capacity to carry out activities will improve Outcome: Progressing

## 2021-11-23 NOTE — Progress Notes (Signed)
Physical Therapy Treatment Patient Details Name: Tres Grzywacz MRN: [id number] DOB: [birth year] Today's Date: 11/23/2021   History of Present Illness 88 yo male from [place] was admitted 10/9 for reflux, chest tightness and substernal chest pain.  Has recently been hypotensive, light headed upon standing today.  LE edema ongoing, elevated troponin, CHF exacerbation.  PMHx:   afib, AAA s/p repair, arthritis, CAD, HTN, PVD, s/p TAVR (2018), and multiple joint replacements, aortic stenosis, anemia,    PT Comments    Pt tolerates treatment well, transferring with reduced assistance and greatly improving ambulation distances. Pt is able to ambulate for limited community distances and verbalizes good knowledge of brake use for rollator. Pt will benefit from continued aggressive mobilization in an effort to improve endurance and to fully restore the pt's PLOF. PT updates recommendations to return to ALF with HHPT services. Pt may require some increased supervision from staff for ADLs upon initial return.   Recommendations for follow up therapy are one component of a multi-disciplinary discharge planning process, led by the attending physician.  Recommendations may be updated based on patient status, additional functional criteria and insurance authorization.  Follow Up Recommendations  Home health PT (at ALF) Can patient physically be transported by private vehicle: Yes   Assistance Recommended at Discharge Frequent or constant Supervision/Assistance  Patient can return home with the following A little help with walking and/or transfers;A little help with bathing/dressing/bathroom;Assistance with cooking/housework;Direct supervision/assist for medications management;Direct supervision/assist for financial management;Help with stairs or ramp for entrance;Assist for transportation   Equipment Recommendations  None recommended by PT (pt owns Rollator)    Recommendations for  Other Services       Precautions / Restrictions Precautions Precautions: Fall Restrictions Weight Bearing Restrictions: No     Mobility  Bed Mobility Overal bed mobility: Needs Assistance Bed Mobility: Supine to Sit   Sidelying to sit: Modified independent (Device/Increase time) Supine to sit: Modified independent (Device/Increase time)     General bed mobility comments: increased time    Transfers Overall transfer level: Needs assistance Equipment used: Rollator (4 wheels) Transfers: Sit to/from Stand Sit to Stand: Supervision                Ambulation/Gait Ambulation/Gait assistance: Min guard Gait Distance (Feet): 400 Feet Assistive device: Rollator (4 wheels) Gait Pattern/deviations: Step-through pattern Gait velocity: reduced Gait velocity interpretation: <1.8 ft/sec, indicate of risk for recurrent falls   General Gait Details: slowed step-through gait   Stairs             Wheelchair Mobility    Modified Rankin (Stroke Patients Only)       Balance Overall balance assessment: Needs assistance Sitting-balance support: No upper extremity supported, Feet supported Sitting balance-Leahy Scale: Good     Standing balance support: Bilateral upper extremity supported, Single extremity supported, Reliant on assistive device for balance Standing balance-Leahy Scale: Poor                              Cognition Arousal/Alertness: Awake/alert Behavior During Therapy: WFL for tasks assessed/performed Overall Cognitive Status: History of cognitive impairments - at baseline                                 General Comments: history of memory deficits        Exercises      General Comments General  comments (skin integrity, edema, etc.): VSS on RA when not gripping rollator, pt denies SOB      Pertinent Vitals/Pain Pain Assessment Pain Assessment: No/denies pain    Home Living                           Prior Function            PT Goals (current goals can now be found in the care plan section) Acute Rehab PT Goals Patient Stated Goal: to go home and live at [facility] Progress towards PT goals: Progressing toward goals    Frequency    Min 3X/week      PT Plan Discharge plan needs to be updated    Co-evaluation              AM-PAC PT "6 Clicks" Mobility   Outcome Measure  Help needed turning from your back to your side while in a flat bed without using bedrails?: None Help needed moving from lying on your back to sitting on the side of a flat bed without using bedrails?: None Help needed moving to and from a bed to a chair (including a wheelchair)?: A Little Help needed standing up from a chair using your arms (e.g., wheelchair or bedside chair)?: A Little Help needed to walk in hospital room?: A Little Help needed climbing 3-5 steps with a railing? : Total 6 Click Score: 18    End of Session   Activity Tolerance: Patient tolerated treatment well Patient left: in bed;with call bell/phone within reach;with bed alarm set Nurse Communication: Mobility status PT Visit Diagnosis: Unsteadiness on feet (R26.81);Muscle weakness (generalized) (M62.81)     Time: 1638-4536 PT Time Calculation (min) (ACUTE ONLY): 27 min  Charges:  $Gait Training: 8-22 mins $Therapeutic Activity: 8-22 mins                     Zenaida Niece, PT, DPT Acute Rehabilitation Office [place] 11/23/2021, 3:54 PM

## 2021-11-23 NOTE — Progress Notes (Signed)
Patient complaining of nausea, gave PRN Zofran. Now patient is again complaining of nausea and dry heaving.

## 2021-11-23 NOTE — Progress Notes (Signed)
PROGRESS NOTE    Tyler Williams  AST:[id number] DOB: [birth year] DOA: 11/21/2021 PCP: Burnard Bunting, MD  88 yo male with the past medical history of hypertension, dyslipidemia,permanent atrial fibrillation, AAA repair, aortic stenosis sp TAVR and heart failure who presented with chest pain. On 08/23 his furosemide dose was decreased from 80 to 40 and entresto held due to hypotension, with systolic blood pressure 80 to 100 mmHg. At home patient developed progressive lower extremity edema and 15 lbs weight gain. The night prior to hospitalization patient had chest tightness and noted his legs weeping fluid, EMS was called , In ED w/ volume overload and Afib RVR,  Chest radiograph with cardiomegaly with bilateral hilar vascular congestion, bilateral interstitial infiltrates with bilateral pleural effusions, more left than right   Subjective: -Nausea since last night, had a bowel movement yesterday, mild abdominal distention  Assessment and Plan:  Acute on chronic combined systolic and diastolic CHF Severe Pulm HTN S/p TAVR Echoc EF 45 to 50%, mild LVH, w/ wall motion abnormality with severe elevation of pulmonary systolic pressure. Severe dilatation of left and right atriums. SP TAVR. -diuresed with IV lasix, entresto, aldactone -He is 2.3 L negative, creatinine stable at 1.0 -Cards following, remains on IV Lasix today -continue metop and imdur -Increase activity, PT OT eval-> SNF recommended   Coronary artery disease -stable, continue statin   Permanent atrial fibrillation (HCC) Continue metoprolol and warfarin    Essential hypertension Continue entresto metoprolol, nitrate   Normocytic anemia Thrombocytopenia  hgb and platelet stable Follow up on iron stores.    Abnormal liver imaging -concern for cirrhosis and mass like lesion near cecum, ? Large polyp vs colonic neoplasm in 2018 -no records of colonoscopy, will need GI FU   CKD3a -stable, monitor with diuresis    HLD (hyperlipidemia) Continue with statin therapy.    Anxiety disorder Continue with as needed lorazepam.  PT/OT   BPH (benign prostatic hyperplasia) No clinical signs of urinary retention.  Continue with finasteride.      DVT prophylaxis:warfarin Code Status: Full Code Family Communication: Discussed with patient detail, no family at bedside Disposition Plan: To be determined   Consultants: Cards   Procedures:   Antimicrobials:    Objective: Vitals:   11/22/21 1623 11/22/21 2013 11/23/21 0025 11/23/21 0345  BP: (!) 160/69 131/61 (!) 138/57 (!) 145/61  Pulse: 70 70 81 77  Resp: '20 18 20 16  '$ Temp: 97.9 F (36.6 C) 98 F (36.7 C) 98.1 F (36.7 C) 98.1 F (36.7 C)  TempSrc: Oral Oral Oral Oral  SpO2: 98% 97% 98% 96%  Weight: 79 kg  78.2 kg   Height: '5\' 8"'$  (1.727 m)       Intake/Output Summary (Last 24 hours) at 11/23/2021 1123 Last data filed at 11/23/2021 0024 Gross per 24 hour  Intake 240 ml  Output 1600 ml  Net -1360 ml   Filed Weights   11/21/21 0655 11/22/21 1623 11/23/21 0025  Weight: 81.6 kg 79 kg 78.2 kg    Examination:  General exam: Pleasant elderly male sitting up in bed, AAOx3, no distress HEENT: Elevated JVD CVS: S1-S2, irregularly irregular rhythm Lungs: Poor air movement Abdomen: Soft, nontender, bowel sounds present Extremities: Chronic venous stasis changes, trace edema anytime now onwards till 4 or 5 PM is good Psychiatry:  Mood & affect appropriate.     Data Reviewed:   CBC: Recent Labs  Lab 11/21/21 0658 11/22/21 0340 11/23/21 0455  WBC 4.2 4.8 5.4  HGB 11.8* 11.2*  12.0*  HCT 35.6* 35.4* 36.0*  MCV 89.4 90.5 86.7  PLT 122* 125* 497*   Basic Metabolic Panel: Recent Labs  Lab 11/21/21 0658 11/22/21 0340 11/23/21 0455  NA 137 138 138  K 4.1 3.9 3.8  CL 105 105 102  CO2 '22 27 26  '$ GLUCOSE 90 104* 105*  BUN 29* 27* 23  CREATININE 1.15 1.16 1.07  CALCIUM 9.3 8.6* 8.9  MG  --  2.4 2.3   GFR: Estimated  Creatinine Clearance: 41.7 mL/min (by C-G formula based on SCr of 1.07 mg/dL). Liver Function Tests: Recent Labs  Lab 11/21/21 0658  AST 24  ALT 14  ALKPHOS 84  BILITOT 1.3*  PROT 6.5  ALBUMIN 3.2*   No results for input(s): "LIPASE", "AMYLASE" in the last 168 hours. No results for input(s): "AMMONIA" in the last 168 hours. Coagulation Profile: Recent Labs  Lab 11/21/21 0658 11/22/21 0340 11/23/21 0455  INR 3.2* 3.1* 2.4*   Cardiac Enzymes: No results for input(s): "CKTOTAL", "CKMB", "CKMBINDEX", "TROPONINI" in the last 168 hours. BNP (last 3 results) No results for input(s): "PROBNP" in the last 8760 hours. HbA1C: No results for input(s): "HGBA1C" in the last 72 hours. CBG: No results for input(s): "GLUCAP" in the last 168 hours. Lipid Profile: No results for input(s): "CHOL", "HDL", "LDLCALC", "TRIG", "CHOLHDL", "LDLDIRECT" in the last 72 hours. Thyroid Function Tests: No results for input(s): "TSH", "T4TOTAL", "FREET4", "T3FREE", "THYROIDAB" in the last 72 hours. Anemia Panel: No results for input(s): "VITAMINB12", "FOLATE", "FERRITIN", "TIBC", "IRON", "RETICCTPCT" in the last 72 hours. Urine analysis:    Component Value Date/Time   COLORURINE RED (A) 06/13/2020 1335   APPEARANCEUR Clear 10/05/2021 1516   LABSPEC 1.010 06/13/2020 1335   PHURINE 6.5 06/13/2020 1335   GLUCOSEU Negative 10/05/2021 1516   HGBUR LARGE (A) 06/13/2020 1335   BILIRUBINUR Negative 10/05/2021 1516   KETONESUR 15 (A) 06/13/2020 1335   PROTEINUR Negative 10/05/2021 1516   PROTEINUR >300 (A) 06/13/2020 1335   UROBILINOGEN 0.2 09/27/2013 2341   NITRITE Negative 10/05/2021 1516   NITRITE POSITIVE (A) 06/13/2020 1335   LEUKOCYTESUR 3+ (A) 10/05/2021 1516   LEUKOCYTESUR MODERATE (A) 06/13/2020 1335   Sepsis Labs: '@LABRCNTIP'$ (procalcitonin:4,lacticidven:4)  ) Recent Results (from the past 240 hour(s))  MRSA Next Gen by PCR, Nasal     Status: Abnormal   Collection Time: 11/22/21  8:59 PM    Specimen: Nasal Mucosa; Nasal Swab  Result Value Ref Range Status   MRSA by PCR Next Gen DETECTED (A) NOT DETECTED Final    Comment: CRITICAL RESULT CALLED TO, READ BACK BY AND VERIFIED WITH:  C/ Florence Canner, RN 11/22/21 2305 A. [place] (NOTE) The GeneXpert MRSA Assay (FDA approved for NASAL specimens only), is one component of a comprehensive MRSA colonization surveillance program. It is not intended to diagnose MRSA infection nor to guide or monitor treatment for MRSA infections. Test performance is not FDA approved in patients less than 53 years old. Performed at [facility], [place] [address] [place], [place] [address]      Radiology Studies: No results found.   Scheduled Meds:  atorvastatin  20 mg Oral QPM   Chlorhexidine Gluconate Cloth  6 each Topical Q0600   finasteride  5 mg Oral Daily   furosemide  60 mg Intravenous Q12H   isosorbide mononitrate  120 mg Oral Daily   metoprolol succinate  12.5 mg Oral Daily   mupirocin ointment  1 Application Nasal BID   potassium chloride  20  mEq Oral Once   sacubitril-valsartan  1 tablet Oral BID   spironolactone  25 mg Oral Daily   tamsulosin  0.4 mg Oral Daily   warfarin  5 mg Oral ONCE-1600   Warfarin - Pharmacist Dosing Inpatient   Does not apply q1600   Continuous Infusions:   LOS: 2 days    Time spent: 74mn    PDomenic Polite MD [facility]   11/23/2021, 11:23 AM

## 2021-11-23 NOTE — Progress Notes (Signed)
Progress Note  Patient Name: Tyler Williams Date of Encounter: 11/23/2021  Primary Cardiologist: Sherren Mocha, MD  Subjective   Breathing better, had some nausea overnight. Feels this is manageable. Happy to be in a hospital bed and out of the ER.  Inpatient Medications    Scheduled Meds:  atorvastatin  20 mg Oral QPM   Chlorhexidine Gluconate Cloth  6 each Topical Q0600   finasteride  5 mg Oral Daily   furosemide  60 mg Intravenous Q12H   isosorbide mononitrate  120 mg Oral Daily   metoprolol succinate  12.5 mg Oral Daily   mupirocin ointment  1 Application Nasal BID   potassium chloride  20 mEq Oral Once   sacubitril-valsartan  1 tablet Oral BID   spironolactone  25 mg Oral Daily   tamsulosin  0.4 mg Oral Daily   warfarin  5 mg Oral ONCE-1600   Warfarin - Pharmacist Dosing Inpatient   Does not apply q1600   Continuous Infusions:  PRN Meds: acetaminophen **OR** acetaminophen, albuterol, LORazepam, ondansetron **OR** ondansetron (ZOFRAN) IV   Vital Signs    Vitals:   11/22/21 1623 11/22/21 2013 11/23/21 0025 11/23/21 0345  BP: (!) 160/69 131/61 (!) 138/57 (!) 145/61  Pulse: 70 70 81 77  Resp: '20 18 20 16  '$ Temp: 97.9 F (36.6 C) 98 F (36.7 C) 98.1 F (36.7 C) 98.1 F (36.7 C)  TempSrc: Oral Oral Oral Oral  SpO2: 98% 97% 98% 96%  Weight: 79 kg  78.2 kg   Height: '5\' 8"'$  (1.727 m)       Intake/Output Summary (Last 24 hours) at 11/23/2021 0902 Last data filed at 11/23/2021 0024 Gross per 24 hour  Intake 240 ml  Output 1600 ml  Net -1360 ml      11/23/2021   12:25 AM 11/22/2021    4:23 PM 11/21/2021    6:55 AM  Last 3 Weights  Weight (lbs) 172 lb 6.4 oz 174 lb 2.6 oz 180 lb  Weight (kg) 78.2 kg 79 kg 81.647 kg     Telemetry    Atrial fib, occasional PVCs - Personally Reviewed  Physical Exam   GEN: Well nourished, well developed in no acute distress HEENT: Normal, moist mucous membranes NECK: mildly elevated JVD low neck on R  side CARDIAC: irregularly irregular rhythm, normal S1 and S2, no rubs or gallops. 1/6 systolic murmur. VASCULAR: Radial and DP pulses 2+ bilaterally. No carotid bruits RESPIRATORY:  Diminished bilateral bases but improving ABDOMEN: Soft, non-tender, non-distended MUSCULOSKELETAL:  moves all 4 limbs independently SKIN: Warm and dry, improving bilateral LE edema with chronic venous stasis changes NEUROLOGIC:  Alert and oriented x 3. No focal neuro deficits noted. PSYCHIATRIC:  Normal affect    Labs    High Sensitivity Troponin:   Recent Labs  Lab 11/21/21 0658 11/21/21 0913  TROPONINIHS 31* 30*      Cardiac EnzymesNo results for input(s): "TROPONINI" in the last 168 hours. No results for input(s): "TROPIPOC" in the last 168 hours.   Chemistry Recent Labs  Lab 11/21/21 0658 11/22/21 0340 11/23/21 0455  NA 137 138 138  K 4.1 3.9 3.8  CL 105 105 102  CO2 '22 27 26  '$ GLUCOSE 90 104* 105*  BUN 29* 27* 23  CREATININE 1.15 1.16 1.07  CALCIUM 9.3 8.6* 8.9  PROT 6.5  --   --   ALBUMIN 3.2*  --   --   AST 24  --   --   ALT 14  --   --  ALKPHOS 84  --   --   BILITOT 1.3*  --   --   GFRNONAA 59* 59* >60  ANIONGAP '10 6 10     '$ Hematology Recent Labs  Lab 11/21/21 0658 11/22/21 0340 11/23/21 0455  WBC 4.2 4.8 5.4  RBC 3.98* 3.91* 4.15*  HGB 11.8* 11.2* 12.0*  HCT 35.6* 35.4* 36.0*  MCV 89.4 90.5 86.7  MCH 29.6 28.6 28.9  MCHC 33.1 31.6 33.3  RDW 16.7* 16.8* 16.8*  PLT 122* 125* 128*    BNP Recent Labs  Lab 11/21/21 0658  BNP 1,254.9*     DDimer No results for input(s): "DDIMER" in the last 168 hours.   Radiology    No results found.  Cardiac Studies   Echo 07/06/2021   1. Left ventricular ejection fraction, by estimation, is 45 to 50%. The  left ventricle has mildly decreased function. The left ventricle  demonstrates regional wall motion abnormalities (see scoring  diagram/findings for description). There is mild  concentric left ventricular  hypertrophy. Left ventricular diastolic  function could not be evaluated. There is severe hypokinesis of the left  ventricular, basal-mid inferior wall and inferolateral wall.   2. Right ventricular systolic function is mildly reduced. The right  ventricular size is mildly enlarged. There is severely elevated pulmonary  artery systolic pressure.   3. Left atrial size was severely dilated.   4. Right atrial size was severely dilated.   5. The mitral valve is normal in structure. Mild to moderate mitral valve  regurgitation. No evidence of mitral stenosis. Moderate mitral annular  calcification.   6. Tricuspid valve regurgitation is moderate.   7. There is trivial perivalvular leak at the posterior annulus. The  aortic valve has been repaired/replaced. Aortic valve regurgitation is  trivial. There is a 29 mm Sapien prosthetic (TAVR) valve present in the  aortic position. Procedure Date: 2018.  Aortic valve mean gradient measures 6.0 mmHg. Aortic valve Vmax measures  1.64 m/s. Aortic valve acceleration time measures 88 msec.   8. The inferior vena cava is dilated in size with <50% respiratory  variability, suggesting right atrial pressure of 15 mmHg.   Comparison(s): The left ventricular function is unchanged. The left  ventricular wall motion abnormality is unchanged. TAVR parameters are  unchanged. The estimated systolic PA pressure is higher.   Patient Profile     88 y.o. male with  permanent atrial fibrillation, CAD s/p POBA 1980s, 1990s (details unclear), known stenotic RCA (previous unsuccessful intervention), chronic HFmrEF, severe AS s/p TAVR 06/2016, mild-moderate MR by echo 06/2021, severe pulmonary HTN, PVD with AAA s/p repair 1993, bilateral renal artery atherosclerosis, SMA stenosis with collateralization, mild carotid artery disease (1-39% 2018), historical PVCs on telemetry, venous insufficiency, probable CKD stage 3a, urinary rentention, aortic atherosclerosis, very remote PE  (1964), arthritis, GERD, macular degeneration, RBBB, cirrhotic morphology on liver on CT, colon mass by CT 2018, remote SSS. Admitted with worsening SOB, edema, weight gain and chest tightness.  Assessment & Plan    Chest tightness with suspected acute on chronic HFmrEF, known severe pulmonary HTN - improving with diuresis - I/O's and weights not well tracked in ER setting but has had brisk urine output - continue home Entresto, metoprolol, Imdur, spironolactone - renal function stable - admission weight 81.6 kg, current weight 78.2 kg, net negative tracked at 2L but likely more given urine output   Known CAD s/p remote POBAs, stenotic RCA treated medically - hsTroponin low/flat, not specifically suggestive of ACS - likely  demand due to CHF - not on ASA with concomitant warfarin - continue BB, statin   Permanent atrial fib with occasional PVCs (known) - on chronic warfarin for anticoagulation. Appreciate pharmacy management of dosing. - continue rate control strategy with metoprolol - Mg >2, K 3.8--will give 20 meQ K once nausea improved (aim for lunchtime)   PVD -follow clinically as OP f/u   Essential HTN -  manage in context above   Severe AS s/p TAVR 2018, mild-moderate MR - continue volume control as above- - recent echo 06/2021, will defer to MD if repeat needed   Per primary team: Anemia/thrombocytopenia Prior abnormal liver imaging    For questions or updates, please contact [place] Please consult [email or web address] for contact info under Cardiology/STEMI.  Signed, Buford Dresser, MD 11/23/2021, 9:02 AM

## 2021-11-24 ENCOUNTER — Inpatient Hospital Stay (HOSPITAL_COMMUNITY): Payer: Medicare Other

## 2021-11-24 DIAGNOSIS — I5043 Acute on chronic combined systolic (congestive) and diastolic (congestive) heart failure: Secondary | ICD-10-CM | POA: Diagnosis not present

## 2021-11-24 DIAGNOSIS — I4821 Permanent atrial fibrillation: Secondary | ICD-10-CM | POA: Diagnosis not present

## 2021-11-24 LAB — CBC
HCT: 36.2 % — ABNORMAL LOW (ref 39.0–52.0)
Hemoglobin: 11.5 g/dL — ABNORMAL LOW (ref 13.0–17.0)
MCH: 28.5 pg (ref 26.0–34.0)
MCHC: 31.8 g/dL (ref 30.0–36.0)
MCV: 89.6 fL (ref 80.0–100.0)
Platelets: 134 10*3/uL — ABNORMAL LOW (ref 150–400)
RBC: 4.04 MIL/uL — ABNORMAL LOW (ref 4.22–5.81)
RDW: 16.6 % — ABNORMAL HIGH (ref 11.5–15.5)
WBC: 5.6 10*3/uL (ref 4.0–10.5)
nRBC: 0 % (ref 0.0–0.2)

## 2021-11-24 LAB — PROTIME-INR
INR: 2.2 — ABNORMAL HIGH (ref 0.8–1.2)
Prothrombin Time: 24.2 seconds — ABNORMAL HIGH (ref 11.4–15.2)

## 2021-11-24 LAB — BASIC METABOLIC PANEL
Anion gap: 8 (ref 5–15)
BUN: 29 mg/dL — ABNORMAL HIGH (ref 8–23)
CO2: 26 mmol/L (ref 22–32)
Calcium: 8.8 mg/dL — ABNORMAL LOW (ref 8.9–10.3)
Chloride: 104 mmol/L (ref 98–111)
Creatinine, Ser: 1.37 mg/dL — ABNORMAL HIGH (ref 0.61–1.24)
GFR, Estimated: 48 mL/min — ABNORMAL LOW (ref >60)
Glucose, Bld: 96 mg/dL (ref 70–99)
Potassium: 4 mmol/L (ref 3.5–5.1)
Sodium: 138 mmol/L (ref 135–145)

## 2021-11-24 MED ORDER — SACUBITRIL-VALSARTAN 24-26 MG PO TABS
1.0000 | ORAL_TABLET | Freq: Two times a day (BID) | ORAL | Status: DC
  Administered 2021-11-25: 1 via ORAL
  Filled 2021-11-24: qty 1

## 2021-11-24 MED ORDER — WARFARIN SODIUM 5 MG PO TABS
5.0000 mg | ORAL_TABLET | Freq: Once | ORAL | Status: AC
  Administered 2021-11-24: 5 mg via ORAL
  Filled 2021-11-24: qty 1

## 2021-11-24 MED ORDER — ISOSORBIDE MONONITRATE ER 60 MG PO TB24
60.0000 mg | ORAL_TABLET | Freq: Every day | ORAL | Status: DC
  Administered 2021-11-24 – 2021-11-25 (×2): 60 mg via ORAL
  Filled 2021-11-24 (×2): qty 1

## 2021-11-24 MED ORDER — FUROSEMIDE 40 MG PO TABS
40.0000 mg | ORAL_TABLET | Freq: Every day | ORAL | Status: DC
  Administered 2021-11-24 – 2021-11-25 (×2): 40 mg via ORAL
  Filled 2021-11-24 (×2): qty 1

## 2021-11-24 NOTE — Progress Notes (Signed)
PROGRESS NOTE    Tyler Williams  UXN:[id number] DOB: 1926-04-20 DOA: 11/21/2021 PCP: Burnard Bunting, MD  88 yo male with the past medical history of hypertension, dyslipidemia,permanent atrial fibrillation, AAA repair, aortic stenosis sp TAVR and heart failure who presented with chest pain. On 08/23 his furosemide dose was decreased from 80 to 40 and entresto held due to hypotension, with systolic blood pressure 80 to 100 mmHg. At home patient developed progressive lower extremity edema and 15 lbs weight gain. The night prior to hospitalization patient had chest tightness and noted his legs weeping fluid, EMS was called , In ED w/ volume overload and Afib RVR,  Chest radiograph with cardiomegaly with bilateral hilar vascular congestion, bilateral interstitial infiltrates with bilateral pleural effusions, more left than right   Subjective: -Nausea since last night, had a bowel movement yesterday, mild abdominal distention  Assessment and Plan:  Acute on chronic combined systolic and diastolic CHF Severe Pulm HTN S/p TAVR Echoc EF 45 to 50%, mild LVH, w/ wall motion abnormality with severe elevation of pulmonary systolic pressure. Severe dilatation of left and right atriums. SP TAVR. -diuresed with IV lasix, entresto, aldactone -He is 4.1 L negative, creatinine bumped to 1.3 today, hold IV Lasix today -Cards following -continue metop and imdur -Increase activity, PT OT eval-> back to ALF with home health PT   Coronary artery disease -stable, continue statin   Permanent atrial fibrillation (HCC) Continue metoprolol and warfarin    Essential hypertension Continue entresto metoprolol, nitrate   Normocytic anemia Thrombocytopenia -Stable   Abnormal liver imaging -concern for cirrhosis and mass like lesion near cecum, ? Large polyp vs colonic neoplasm in 2018 -Discussed with son who thinks patient had a colonoscopy within the last 4- 5 years,  -Check abdominal  ultrasound  CKD3a -stable, monitor with diuresis   HLD (hyperlipidemia) Continue with statin therapy.    Anxiety disorder Continue with as needed lorazepam.  PT/OT   BPH (benign prostatic hyperplasia) No clinical signs of urinary retention.  Continue with finasteride.      DVT prophylaxis:warfarin Code Status: Full Code Family Communication: No family at bedside, called and updated patient's son Disposition Plan: Back to ALF with home health PT in 1 to 2 days   Consultants: Cards   Procedures:   Antimicrobials:    Objective: Vitals:   11/23/21 1133 11/23/21 1806 11/23/21 1936 11/24/21 0507  BP: 123/60 (!) 97/38 (!) 108/42 (!) 143/61  Pulse: 74  (!) 58   Resp: '14  18 18  '$ Temp: 98 F (36.7 C)  98.4 F (36.9 C) 98.1 F (36.7 C)  TempSrc: Oral  Oral Oral  SpO2: 97%  98% 95%  Weight:    74.7 kg  Height:        Intake/Output Summary (Last 24 hours) at 11/24/2021 1007 Last data filed at 11/24/2021 2542 Gross per 24 hour  Intake 290 ml  Output 2075 ml  Net -1785 ml   Filed Weights   11/22/21 1623 11/23/21 0025 11/24/21 0507  Weight: 79 kg 78.2 kg 74.7 kg    Examination:  General exam: Elderly male laying in bed, AAO x2, no distress HEENT: No JVD CVS: S1-S2, irregularly irregular rhythm Lungs: Improved air movement, decreased at the bases Abdomen: Soft, nontender, bowel sounds present  Extremities: Chronic venous stasis changes, trace edema  Psychiatry:  Mood & affect appropriate.     Data Reviewed:   CBC: Recent Labs  Lab 11/21/21 0658 11/22/21 0340 11/23/21 0455 11/24/21 0040  WBC 4.2  4.8 5.4 5.6  HGB 11.8* 11.2* 12.0* 11.5*  HCT 35.6* 35.4* 36.0* 36.2*  MCV 89.4 90.5 86.7 89.6  PLT 122* 125* 128* 081*   Basic Metabolic Panel: Recent Labs  Lab 11/21/21 0658 11/22/21 0340 11/23/21 0455 11/24/21 0040  NA 137 138 138 138  K 4.1 3.9 3.8 4.0  CL 105 105 102 104  CO2 '22 27 26 26  '$ GLUCOSE 90 104* 105* 96  BUN 29* 27* 23 29*   CREATININE 1.15 1.16 1.07 1.37*  CALCIUM 9.3 8.6* 8.9 8.8*  MG  --  2.4 2.3  --    GFR: Estimated Creatinine Clearance: 32.6 mL/min (A) (by C-G formula based on SCr of 1.37 mg/dL (H)). Liver Function Tests: Recent Labs  Lab 11/21/21 0658  AST 24  ALT 14  ALKPHOS 84  BILITOT 1.3*  PROT 6.5  ALBUMIN 3.2*   No results for input(s): "LIPASE", "AMYLASE" in the last 168 hours. No results for input(s): "AMMONIA" in the last 168 hours. Coagulation Profile: Recent Labs  Lab 11/21/21 0658 11/22/21 0340 11/23/21 0455 11/24/21 0040  INR 3.2* 3.1* 2.4* 2.2*   Cardiac Enzymes: No results for input(s): "CKTOTAL", "CKMB", "CKMBINDEX", "TROPONINI" in the last 168 hours. BNP (last 3 results) No results for input(s): "PROBNP" in the last 8760 hours. HbA1C: No results for input(s): "HGBA1C" in the last 72 hours. CBG: No results for input(s): "GLUCAP" in the last 168 hours. Lipid Profile: No results for input(s): "CHOL", "HDL", "LDLCALC", "TRIG", "CHOLHDL", "LDLDIRECT" in the last 72 hours. Thyroid Function Tests: No results for input(s): "TSH", "T4TOTAL", "FREET4", "T3FREE", "THYROIDAB" in the last 72 hours. Anemia Panel: No results for input(s): "VITAMINB12", "FOLATE", "FERRITIN", "TIBC", "IRON", "RETICCTPCT" in the last 72 hours. Urine analysis:    Component Value Date/Time   COLORURINE YELLOW 11/23/2021 [place] 11/23/2021 1548   APPEARANCEUR Clear 10/05/2021 1516   LABSPEC 1.008 11/23/2021 1548   PHURINE 5.0 11/23/2021 1548   GLUCOSEU NEGATIVE 11/23/2021 1548   HGBUR LARGE (A) 11/23/2021 1548   BILIRUBINUR NEGATIVE 11/23/2021 1548   BILIRUBINUR Negative 10/05/2021 1516   KETONESUR NEGATIVE 11/23/2021 1548   PROTEINUR NEGATIVE 11/23/2021 1548   UROBILINOGEN 0.2 09/27/2013 2341   NITRITE NEGATIVE 11/23/2021 1548   LEUKOCYTESUR NEGATIVE 11/23/2021 1548   Sepsis Labs: '@LABRCNTIP'$ (procalcitonin:4,lacticidven:4)  ) Recent Results (from the past 240 hour(s))   MRSA Next Gen by PCR, Nasal     Status: Abnormal   Collection Time: 11/22/21  8:59 PM   Specimen: Nasal Mucosa; Nasal Swab  Result Value Ref Range Status   MRSA by PCR Next Gen DETECTED (A) NOT DETECTED Final    Comment: CRITICAL RESULT CALLED TO, READ BACK BY AND VERIFIED WITH:  C/ Florence Canner, RN 11/22/21 2305 A. LAFR[place] (NOTE) The GeneXpert MRSA Assay (FDA approved for NASAL specimens only), is one component of a comprehensive MRSA colonization surveillance program. It is not intended to diagnose MRSA infection nor to guide or monitor treatment for MRSA infections. Test performance is not FDA approved in patients less than 90 years old. Performed at [facility], [place] [address] [place], [place] [address]      Radiology Studies: No results found.   Scheduled Meds:  atorvastatin  20 mg Oral QPM   Chlorhexidine Gluconate Cloth  6 each Topical Q0600   finasteride  5 mg Oral Daily   furosemide  40 mg Oral Daily   isosorbide mononitrate  60 mg Oral Daily   metoprolol succinate  12.5 mg  Oral Daily   mupirocin ointment  1 Application Nasal BID   [START ON 11/25/2021] sacubitril-valsartan  1 tablet Oral BID   spironolactone  25 mg Oral Daily   tamsulosin  0.4 mg Oral Daily   warfarin  5 mg Oral ONCE-1600   Warfarin - Pharmacist Dosing Inpatient   Does not apply q1600   Continuous Infusions:   LOS: 3 days    Time spent: 57mn    PDomenic Polite MD [facility]   11/24/2021, 10:07 AM

## 2021-11-24 NOTE — Progress Notes (Signed)
Physical Therapy Treatment Patient Details Name: Dash Cardarelli MRN: [id number] DOB: [birth year] Today's Date: 11/24/2021   History of Present Illness 88 yo male from [place] was admitted 10/9 for reflux, chest tightness and substernal chest pain.  Has recently been hypotensive, light headed upon standing today.  LE edema ongoing, elevated troponin, CHF exacerbation.  PMHx:   afib, AAA s/p repair, arthritis, CAD, HTN, PVD, s/p TAVR (2018), and multiple joint replacements, aortic stenosis, anemia,    PT Comments    Pt tolerates treatment well, ambulating for limited community distances. Pt demonstrates increased time and effort to transfer initially but demonstrates improved efficiency and quality after gait training. Pt demonstrates fair safety awareness, locking brakes on rollator. Pt will benefit from continued aggressive mobilization. PT continues to recommend discharge to ALF with HHPT services.   Recommendations for follow up therapy are one component of a multi-disciplinary discharge planning process, led by the attending physician.  Recommendations may be updated based on patient status, additional functional criteria and insurance authorization.  Follow Up Recommendations  Home health PT (at ALF)     Assistance Recommended at Discharge Intermittent Supervision/Assistance  Patient can return home with the following A little help with walking and/or transfers;A little help with bathing/dressing/bathroom;Assistance with cooking/housework;Direct supervision/assist for medications management;Direct supervision/assist for financial management;Help with stairs or ramp for entrance;Assist for transportation   Equipment Recommendations  None recommended by PT    Recommendations for Other Services       Precautions / Restrictions Precautions Precautions: Fall Restrictions Weight Bearing Restrictions: No     Mobility  Bed Mobility Overal bed mobility: Needs  Assistance Bed Mobility: Supine to Sit, Sit to Supine     Supine to sit: Modified independent (Device/Increase time) Sit to supine: Supervision   General bed mobility comments: increased time with supine to sit, verbal cueing with sit to supine    Transfers Overall transfer level: Needs assistance Equipment used: Rollator (4 wheels) Transfers: Sit to/from Stand Sit to Stand: Supervision           General transfer comment: good recal of brake use. Pt performs 1 sit to stand pre-gait training with minG, 4 sit to stands after with supervision and improved efficiency    Ambulation/Gait Ambulation/Gait assistance: Supervision Gait Distance (Feet): 400 Feet Assistive device: Rollator (4 wheels) Gait Pattern/deviations: Step-through pattern Gait velocity: reduced Gait velocity interpretation: <1.8 ft/sec, indicate of risk for recurrent falls   General Gait Details: slowed step-through gait   Stairs             Wheelchair Mobility    Modified Rankin (Stroke Patients Only)       Balance Overall balance assessment: Needs assistance Sitting-balance support: No upper extremity supported, Feet supported Sitting balance-Leahy Scale: Good     Standing balance support: Single extremity supported, Bilateral upper extremity supported, Reliant on assistive device for balance Standing balance-Leahy Scale: Poor                              Cognition Arousal/Alertness: Awake/alert Behavior During Therapy: WFL for tasks assessed/performed Overall Cognitive Status: History of cognitive impairments - at baseline                                 General Comments: slowed processing, impaired memory        Exercises      General  Comments General comments (skin integrity, edema, etc.): VSS on RA      Pertinent Vitals/Pain Pain Assessment Pain Assessment: No/denies pain    Home Living                          Prior Function             PT Goals (current goals can now be found in the care plan section) Acute Rehab PT Goals Patient Stated Goal: to go home and live at [facility] Progress towards PT goals: Progressing toward goals    Frequency    Min 3X/week      PT Plan Current plan remains appropriate    Co-evaluation              AM-PAC PT "6 Clicks" Mobility   Outcome Measure  Help needed turning from your back to your side while in a flat bed without using bedrails?: None Help needed moving from lying on your back to sitting on the side of a flat bed without using bedrails?: None Help needed moving to and from a bed to a chair (including a wheelchair)?: A Little Help needed standing up from a chair using your arms (e.g., wheelchair or bedside chair)?: A Little Help needed to walk in hospital room?: A Little Help needed climbing 3-5 steps with a railing? : A Lot 6 Click Score: 19    End of Session   Activity Tolerance: Patient tolerated treatment well Patient left: in bed;with call bell/phone within reach;with bed alarm set Nurse Communication: Mobility status PT Visit Diagnosis: Unsteadiness on feet (R26.81);Muscle weakness (generalized) (M62.81)     Time: 1013-1040 PT Time Calculation (min) (ACUTE ONLY): 27 min  Charges:  $Gait Training: 8-22 mins $Therapeutic Activity: 8-22 mins                     Zenaida Niece, PT, DPT Acute Rehabilitation Office [place] 11/24/2021, 12:19 PM

## 2021-11-24 NOTE — Progress Notes (Signed)
   11/24/21 1500  Mobility  Activity Refused mobility   Mobility Specialist Progress Note  Pt refused mobility d/t not feeling like it. Will f/u as able.    Lucious Groves Mobility Specialist

## 2021-11-24 NOTE — TOC Progression Note (Addendum)
Transition of Care [facility]) - Progression Note    Patient Details  Name: Tyler Williams MRN: [id number] Date of Birth: 28-Apr-1926  Transition of Care [facility]) CM/SW [place], [place] Phone Number: 11/24/2021, 4:29 PM  Clinical Narrative:     CSW spoke with Jolyne Loa at [place] and provided update. [place] provided fax# [phone] to send DC summary and fl2 to when Dc'd. PT rec has been updated to [facility].   Expected Discharge Plan: Assisted Living Barriers to Discharge: Continued Medical Work up  Expected Discharge Plan and Services Expected Discharge Plan: Assisted Living       Living arrangements for the past 2 months: Assisted Living Facility                                       Social Determinants of Health (SDOH) Interventions    Readmission Risk Interventions     No data to display

## 2021-11-24 NOTE — Progress Notes (Signed)
Progress Note  Patient Name: Dionne Rossa Date of Encounter: 11/24/2021  Primary Cardiologist: Sherren Mocha, MD  Subjective   No acute events overnight. Nausea improved.  Inpatient Medications    Scheduled Meds:  atorvastatin  20 mg Oral QPM   Chlorhexidine Gluconate Cloth  6 each Topical Q0600   finasteride  5 mg Oral Daily   furosemide  40 mg Oral Daily   isosorbide mononitrate  60 mg Oral Daily   metoprolol succinate  12.5 mg Oral Daily   mupirocin ointment  1 Application Nasal BID   [START ON 11/25/2021] sacubitril-valsartan  1 tablet Oral BID   spironolactone  25 mg Oral Daily   tamsulosin  0.4 mg Oral Daily   warfarin  5 mg Oral ONCE-1600   Warfarin - Pharmacist Dosing Inpatient   Does not apply q1600   Continuous Infusions:  PRN Meds: acetaminophen **OR** acetaminophen, albuterol, LORazepam, ondansetron **OR** ondansetron (ZOFRAN) IV   Vital Signs    Vitals:   11/23/21 1806 11/23/21 1936 11/24/21 0507 11/24/21 1106  BP: (!) 97/38 (!) 108/42 (!) 143/61 (!) 147/62  Pulse:  (!) 58  67  Resp:  '18 18 20  '$ Temp:  98.4 F (36.9 C) 98.1 F (36.7 C) 98 F (36.7 C)  TempSrc:  Oral Oral Oral  SpO2:  98% 95% 95%  Weight:   74.7 kg   Height:        Intake/Output Summary (Last 24 hours) at 11/24/2021 1321 Last data filed at 11/24/2021 1228 Gross per 24 hour  Intake 650 ml  Output 925 ml  Net -275 ml      11/24/2021    5:07 AM 11/23/2021   12:25 AM 11/22/2021    4:23 PM  Last 3 Weights  Weight (lbs) 164 lb 11.2 oz 172 lb 6.4 oz 174 lb 2.6 oz  Weight (kg) 74.707 kg 78.2 kg 79 kg     Telemetry    Atrial fib, occasional PVCs - Personally Reviewed  Physical Exam   GEN: Well nourished, well developed in no acute distress NECK: No JVD CARDIAC: irregularly irregular rhythm, normal S1 and S2, no rubs or gallops. 2/6 systolic murmur. VASCULAR: Radial pulses 2+ bilaterally.  RESPIRATORY:  Clear to auscultation without rales, wheezing or rhonchi   ABDOMEN: Soft, non-tender, non-distended MUSCULOSKELETAL:  Moves all 4 limbs independently SKIN: Warm and dry, improving brawny LE edema bilaterally NEUROLOGIC:  No focal neuro deficits noted. PSYCHIATRIC:  Normal affect    Labs    High Sensitivity Troponin:   Recent Labs  Lab 11/21/21 0658 11/21/21 0913  TROPONINIHS 31* 30*      Cardiac EnzymesNo results for input(s): "TROPONINI" in the last 168 hours. No results for input(s): "TROPIPOC" in the last 168 hours.   Chemistry Recent Labs  Lab 11/21/21 0658 11/22/21 0340 11/23/21 0455 11/24/21 0040  NA 137 138 138 138  K 4.1 3.9 3.8 4.0  CL 105 105 102 104  CO2 '22 27 26 26  '$ GLUCOSE 90 104* 105* 96  BUN 29* 27* 23 29*  CREATININE 1.15 1.16 1.07 1.37*  CALCIUM 9.3 8.6* 8.9 8.8*  PROT 6.5  --   --   --   ALBUMIN 3.2*  --   --   --   AST 24  --   --   --   ALT 14  --   --   --   ALKPHOS 84  --   --   --   BILITOT 1.3*  --   --   --  GFRNONAA 59* 59* >60 48*  ANIONGAP '10 6 10 8     '$ Hematology Recent Labs  Lab 11/22/21 0340 11/23/21 0455 11/24/21 0040  WBC 4.8 5.4 5.6  RBC 3.91* 4.15* 4.04*  HGB 11.2* 12.0* 11.5*  HCT 35.4* 36.0* 36.2*  MCV 90.5 86.7 89.6  MCH 28.6 28.9 28.5  MCHC 31.6 33.3 31.8  RDW 16.8* 16.8* 16.6*  PLT 125* 128* 134*    BNP Recent Labs  Lab 11/21/21 0658  BNP 1,254.9*     DDimer No results for input(s): "DDIMER" in the last 168 hours.   Radiology    US Abdomen Limited RUQ (LIVER/GB)  Result Date: 11/24/2021 CLINICAL DATA:  Cirrhosis EXAM: ULTRASOUND ABDOMEN LIMITED RIGHT UPPER QUADRANT COMPARISON:  02/08/2018 CT abdomen pelvis FINDINGS: Gallbladder: Surgically absent. Common bile duct: Diameter: 6 mm within normal limits given prior cholecystectomy. No intrahepatic biliary ductal dilatation. Liver: No focal lesion identified. Nodularity, which is most noted in the left hepatic lobe. Heterogeneous and increased parenchymal echogenicity. Portal vein is patent on color Doppler  imaging with normal direction of blood flow towards the liver. Other: Pleural effusion. IMPRESSION: 1. Hepatic cirrhosis. No focal liver lesion. 2. Pleural effusion. Electronically Signed   By: Merilyn Baba M.D.   On: 11/24/2021 12:01    Cardiac Studies   Echo 07/06/2021   1. Left ventricular ejection fraction, by estimation, is 45 to 50%. The  left ventricle has mildly decreased function. The left ventricle  demonstrates regional wall motion abnormalities (see scoring  diagram/findings for description). There is mild  concentric left ventricular hypertrophy. Left ventricular diastolic  function could not be evaluated. There is severe hypokinesis of the left  ventricular, basal-mid inferior wall and inferolateral wall.   2. Right ventricular systolic function is mildly reduced. The right  ventricular size is mildly enlarged. There is severely elevated pulmonary  artery systolic pressure.   3. Left atrial size was severely dilated.   4. Right atrial size was severely dilated.   5. The mitral valve is normal in structure. Mild to moderate mitral valve  regurgitation. No evidence of mitral stenosis. Moderate mitral annular  calcification.   6. Tricuspid valve regurgitation is moderate.   7. There is trivial perivalvular leak at the posterior annulus. The  aortic valve has been repaired/replaced. Aortic valve regurgitation is  trivial. There is a 29 mm Sapien prosthetic (TAVR) valve present in the  aortic position. Procedure Date: 2018.  Aortic valve mean gradient measures 6.0 mmHg. Aortic valve Vmax measures  1.64 m/s. Aortic valve acceleration time measures 88 msec.   8. The inferior vena cava is dilated in size with <50% respiratory  variability, suggesting right atrial pressure of 15 mmHg.   Comparison(s): The left ventricular function is unchanged. The left  ventricular wall motion abnormality is unchanged. TAVR parameters are  unchanged. The estimated systolic PA pressure is  higher.   Patient Profile     88 y.o. male with  permanent atrial fibrillation, CAD s/p POBA 1980s, 1990s (details unclear), known stenotic RCA (previous unsuccessful intervention), chronic HFmrEF, severe AS s/p TAVR 06/2016, mild-moderate MR by echo 06/2021, severe pulmonary HTN, PVD with AAA s/p repair 1993, bilateral renal artery atherosclerosis, SMA stenosis with collateralization, mild carotid artery disease (1-39% 2018), historical PVCs on telemetry, venous insufficiency, probable CKD stage 3a, urinary rentention, aortic atherosclerosis, very remote PE (1964), arthritis, GERD, macular degeneration, RBBB, cirrhotic morphology on liver on CT, colon mass by CT 2018, remote SSS. Admitted with worsening SOB, edema, weight  gain and chest tightness.  Assessment & Plan    Chest tightness with suspected acute on chronic HFmrEF, known severe pulmonary HTN - improving with diuresis - I/O's and weights not well tracked in ER setting but has had brisk urine output - continue home Entresto, metoprolol, Imdur, spironolactone - admission weight 81.6 kg, current weight 74.7 kg, net negative tracked at 4L but likely more given urine output - Cr bumped today, will change to oral lasix   Known CAD s/p remote POBAs, stenotic RCA treated medically - hsTroponin low/flat, not specifically suggestive of ACS - likely demand due to CHF - not on ASA with concomitant warfarin - continue BB, statin   Permanent atrial fib with occasional PVCs (known) - on chronic warfarin for anticoagulation. Appreciate pharmacy management of dosing. - continue rate control strategy with metoprolol   PVD -follow clinically as OP f/u   Essential HTN -  manage in context above   Severe AS s/p TAVR 2018, mild-moderate MR - continue volume control as above- - recent echo 06/2021, will defer to MD if repeat needed   Per primary team: Anemia/thrombocytopenia Prior abnormal liver imaging    For questions or updates, please  contact [place] Please consult [email or web address] for contact info under Cardiology/STEMI.  Signed, Buford Dresser, MD 11/24/2021, 1:21 PM

## 2021-11-24 NOTE — Progress Notes (Signed)
ANTICOAGULATION CONSULT NOTE  Pharmacy Consult:  Coumadin Indication: atrial fibrillation  No Known Allergies  Patient Measurements: Height: '5\' 8"'$  (172.7 cm) Weight: 74.7 kg (164 lb 11.2 oz) IBW/kg (Calculated) : 68.4  Vital Signs: Temp: 98.1 F (36.7 C) (10/12 0507) Temp Source: Oral (10/12 0507) BP: 143/61 (10/12 0507) Pulse Rate: 58 (10/11 1936)  Labs: Recent Labs    11/21/21 0913 11/22/21 0340 11/22/21 0340 11/23/21 0455 11/24/21 0040  HGB  --  11.2*   < > 12.0* 11.5*  HCT  --  35.4*  --  36.0* 36.2*  PLT  --  125*  --  128* 134*  LABPROT  --  31.9*  --  26.0* 24.2*  INR  --  3.1*  --  2.4* 2.2*  CREATININE  --  1.16  --  1.07 1.37*  TROPONINIHS 30*  --   --   --   --    < > = values in this interval not displayed.     Estimated Creatinine Clearance: 32.6 mL/min (A) (by C-G formula based on SCr of 1.37 mg/dL (H)).  Assessment: Patient is a 88 y.o male admitted with CC of chest pain. History of permanent atrial fibrillation for which he is on warfarin. Home warfarin regimen is 7.'5mg'$  on Monday and '5mg'$  all other days. Last dose was on 11/20/2021 in the morning per med rec.   INR therapeutic at 2.2.  No bleeding reported.  Goal of Therapy:  INR 2-3 Monitor platelets by anticoagulation protocol: Yes   Plan:  Repeat Coumadin '5mg'$  PO today Daily PT / INR  Cynthia Cogle D. Mina Marble, PharmD, BCPS, [place] 11/24/2021, 7:10 AM

## 2021-11-24 NOTE — Consult Note (Signed)
   [facility] CM Inpatient Consult   11/24/2021  Tyler Williams [birth year] [id number]  [place] [facility] [ACO] Patient: Medicare ACO REACH  Primary Care Provider: Burnard Bunting, MD, [place] is listed to provide the Transition of Care follow up    Patient screened for hospitalization with noted high risk score for unplanned readmission risk and to assess for potential [place] [facility] service needs for post hospital transition.  Review of patient's medical record reveals patient is from [facility] and recommended for a skilled nursing facility level of care for post hospital.  Patient's electronic medical record reviewed for inpatient TOC, PT/OT, MD progress notes and patient initially was recommended for a skilled nursing level of care however noted patient is for [facility] at ALF.  Plan:  Continue to follow progress and disposition to assess for post hospital care management needs.  Currently, patient can have TOC community follow up for post hospital follow up needs.  For questions contact:   Natividad Brood, RN BSN [place]  [phone] business mobile phone Toll free office [phone]  *[name]  [phone] Fax number: [phone] [name]

## 2021-11-25 ENCOUNTER — Telehealth: Payer: Self-pay | Admitting: Physician Assistant

## 2021-11-25 DIAGNOSIS — I5043 Acute on chronic combined systolic (congestive) and diastolic (congestive) heart failure: Secondary | ICD-10-CM | POA: Diagnosis not present

## 2021-11-25 LAB — BASIC METABOLIC PANEL
Anion gap: 7 (ref 5–15)
BUN: 31 mg/dL — ABNORMAL HIGH (ref 8–23)
CO2: 26 mmol/L (ref 22–32)
Calcium: 8.6 mg/dL — ABNORMAL LOW (ref 8.9–10.3)
Chloride: 106 mmol/L (ref 98–111)
Creatinine, Ser: 1.1 mg/dL (ref 0.61–1.24)
GFR, Estimated: >60 mL/min (ref >60)
Glucose, Bld: 97 mg/dL (ref 70–99)
Potassium: 4 mmol/L (ref 3.5–5.1)
Sodium: 139 mmol/L (ref 135–145)

## 2021-11-25 LAB — PROTIME-INR
INR: 2.6 — ABNORMAL HIGH (ref 0.8–1.2)
Prothrombin Time: 27.5 seconds — ABNORMAL HIGH (ref 11.4–15.2)

## 2021-11-25 MED ORDER — ISOSORBIDE MONONITRATE ER 60 MG PO TB24: 60.0000 mg | ORAL_TABLET | Freq: Every day | ORAL | Status: DC

## 2021-11-25 MED ORDER — WARFARIN SODIUM 2.5 MG PO TABS
2.5000 mg | ORAL_TABLET | Freq: Once | ORAL | Status: AC
  Administered 2021-11-25: 2.5 mg via ORAL
  Filled 2021-11-25: qty 1

## 2021-11-25 NOTE — Progress Notes (Signed)
Patient discharging home. Vital signs stable at time of discharge as reflected in discharge summary. Discharge instructions given and verbal understanding returned by patient and daughter. No questions at this time.

## 2021-11-25 NOTE — Telephone Encounter (Addendum)
   Attention TOC pool,  This patient will need a TOC phone call after discharge. They are being discharged likely today (on medicine service). Going to ALF per notes - was not certain if we still do phone calls for those patients (I know we don't for SNF). Follow-up appointment has already been arranged with Nicki Reaper on 12/05/21 - this was moved up from November. I tried call into pt room to let him know but it was busy each time. Asked patient's hospital nurse via chat to please relay to patient that visit was moved up. I also outlined this on his AVS. They are a patient of Sherren Mocha, MD.  Thank you! Charlie Pitter, PA-C

## 2021-11-25 NOTE — Progress Notes (Signed)
Patient has 2.1 second pause this am,patient asymptomatic. Notified MD and cardiology.

## 2021-11-25 NOTE — Progress Notes (Signed)
Patient had 11 episodes of Vtachs non sustain. Patient is alert. Patient denies complains. BP (!) 142/60 (BP Location: Right Arm)   Pulse (!) 52   Temp 97.6 F (36.4 C) (Oral)   Resp (!) 22   Ht '5\' 8"'$  (1.727 m)   Wt 74.7 kg   SpO2 95%   BMI 25.04 kg/m   Hospitalist made aware.

## 2021-11-25 NOTE — TOC Transition Note (Addendum)
Transition of Care [facility]) - CM/SW Discharge Note   Patient Details  Name: Tyler Williams MRN: [id number] Date of Birth: 10-28-1926  [facility]) CM/SW Contact:  Bethann Berkshire, [place] Phone Number: 11/25/2021, 2:08 PM   Clinical Narrative:     Patient will DC to: [facility] Anticipated DC date: 11/25/21 Family notified: Son, [name] Transport by: Daughter   Per MD patient ready for DC to [facility]. RN, patient, patient's family, and facility notified of DC. Discharge Summary, [place] orders, and FL2 sent to facility. Pt active with PT/OT/RN with [facility]. CSW notified [facility] of orders and DC. Pt's daughter will be transporting pt in private vehicle.    CSW will sign off for now as social work intervention is no longer needed. Please consult [place] again if new needs arise.   [place] at [facility] [employer] [facility] requesting PT-INR labs. CSW faxed those as well too [phone]  Final next level of care: Assisted Living Barriers to Discharge: No Barriers Identified   Patient Goals and CMS Choice        Discharge Placement              Patient chooses bed at:  ([facility]) Patient to be transferred to facility by: Daughter Name of family member notified: Son [name] Patient and family notified of of transfer: 11/25/21  Discharge Plan and Services                                     Social Determinants of Health (SDOH) Interventions     Readmission Risk Interventions     No data to display

## 2021-11-25 NOTE — Discharge Summary (Addendum)
Physician Discharge Summary  Tyler Williams GNF:[id number] DOB: Sep 22, 1926 DOA: 11/21/2021  PCP: Burnard Bunting, MD  Admit date: 11/21/2021 Discharge date: 11/25/2021  Time spent: 35 minutes  Recommendations for Outpatient Follow-up:  Cardiology clinic, [name] on 10/23 PCP Dr. Reynaldo Minium in 1 to 2 weeks, recommend goals of care and CODE STATUS discussions Take an extra dose of Lasix 40 Mg for weight gain, 3 pounds in 1 day or 5 pounds in 1 week Please check INR on Monday 10/16   Discharge Diagnoses:  Principal Problem:   Acute on chronic combined systolic and diastolic CHF (congestive heart failure) ([place]) Liver cirrhosis   Coronary artery disease involving native coronary artery with angina pectoris ([place])   Permanent atrial fibrillation ([place])   Essential hypertension   Normocytic anemia   Chronic kidney disease, stage III (moderate) (HCC)   HLD (hyperlipidemia)   Anxiety disorder   BPH (benign prostatic hyperplasia)   Discharge Condition: Stable  Diet recommendation: Low-sodium, heart healthy  Filed Weights   11/23/21 0025 11/24/21 0507 11/25/21 0330  Weight: 78.2 kg 74.7 kg 74.6 kg    History of present illness:  88 yo male with the past medical history of hypertension, dyslipidemia,permanent atrial fibrillation, AAA repair, aortic stenosis sp TAVR and heart failure who presented with chest pain. On 08/23 his furosemide dose was decreased from 80 to 40 and entresto held due to hypotension, with systolic blood pressure 80 to 100 mmHg. At home patient developed progressive lower extremity edema and 15 lbs weight gain. The night prior to hospitalization patient had chest tightness and noted his legs weeping fluid, EMS was called , In ED w/ volume overload and Afib RVR,  Chest radiograph with cardiomegaly with bilateral hilar vascular congestion, bilateral interstitial infiltrates with bilateral pleural effusions, more left than right   Hospital Course:   Acute on  chronic combined systolic and diastolic CHF Severe Pulm HTN S/p TAVR Echoc EF 45 to 50%, mild LVH, w/ wall motion abnormality with severe elevation of pulmonary systolic pressure. Severe dilatation of left and right atriums. SP TAVR. -diuresed with IV lasix, 4.1 L negative,  -clinically euvolemic now, transition to oral Lasix, continue Entresto, Aldactone -Followed by cardiology this admission, also continued on metoprolol and Imdur -Increase activity, PT OT eval-> back to ALF with home health PT -Now stable for discharge, will go back to [facility], close follow-up arranged with cardiology for 10/23   Coronary artery disease -stable, continue statin   Permanent atrial fibrillation (HCC) Continue metoprolol and warfarin    Essential hypertension Continue entresto metoprolol, nitrate   Normocytic anemia Thrombocytopenia -Stable   Abnormal liver imaging Liver cirrhosis -concern for cirrhosis and mass like lesion near cecum, ? Large polyp vs colonic neoplasm in 2018 -Discussed with son who thinks patient had a colonoscopy within the last 4- 5 years with [facility],  -Abdominal ultrasound indicates liver cirrhosis, diuretics as noted above, continue Lasix and Aldactone, given advanced age and debility would not recommend further work-up   CKD3a -stable   HLD (hyperlipidemia) Continue with statin therapy.    Anxiety disorder Continue with as needed lorazepam.    BPH (benign prostatic hyperplasia) No clinical signs of urinary retention.  Continue with finasteride.   Consultations: Cardiology  Discharge Exam: Vitals:   11/24/21 2012 11/25/21 0330  BP: (!) 129/57 (!) 142/60  Pulse: (!) 56 (!) 52  Resp: 20 (!) 22  Temp: 98.1 F (36.7 C) 97.6 F (36.4 C)  SpO2: 95% 95%  Gen: Awake, Alert, Oriented X 3, mild cognitive deficits HEENT: no JVD Lungs: Good air movement bilaterally, CTAB CVS: S1S2/RRR Abd: soft, Non tender, non distended, BS present Extremities: No  edema  Discharge Instructions   Discharge Instructions     Diet - low sodium heart healthy   Complete by: As directed    Discharge instructions   Complete by: As directed    Monitor daily weights, if weight goes up 3 pounds in 1 day or 5 pounds in 1 week take an extra dose of Lasix '40mg'$    Increase activity slowly   Complete by: As directed       Allergies as of 11/25/2021   No Known Allergies      Medication List     STOP taking these medications    amoxicillin-clavulanate 875-125 MG tablet Commonly known as: AUGMENTIN   doxycycline 100 MG tablet Commonly known as: ADOXA       TAKE these medications    atorvastatin 20 MG tablet Commonly known as: LIPITOR TAKE 1 TABLET ONCE DAILY. What changed: when to take this   CAL-MAG-ZINC PO Take 1 tablet by mouth every morning.   Entresto 24-26 MG Generic drug: sacubitril-valsartan Take 1 tablet by mouth 2 (two) times daily. Please keep upcoming appointment for future refills thank you.   fesoterodine 8 MG Tb24 tablet Commonly known as: TOVIAZ Take 8 mg by mouth every evening.   finasteride 5 MG tablet Commonly known as: PROSCAR Take 1 tablet (5 mg total) by mouth daily.   furosemide 40 MG tablet Commonly known as: LASIX Take 1 tablet (40 mg total) by mouth daily.   isosorbide mononitrate 60 MG 24 hr tablet Commonly known as: IMDUR Take 1 tablet (60 mg total) by mouth daily. What changed: how much to take   LORazepam 1 MG tablet Commonly known as: ATIVAN Take 1 mg by mouth 2 (two) times daily as needed for anxiety.   metoprolol succinate 25 MG 24 hr tablet Commonly known as: Toprol XL Take 0.5 tablets (12.5 mg total) by mouth daily.   nitroGLYCERIN 0.4 MG SL tablet Commonly known as: NITROSTAT Place 0.4 mg under the tongue every 5 (five) minutes as needed for chest pain.   PreserVision AREDS Tabs Take 1 tablet by mouth daily.   spironolactone 25 MG tablet Commonly known as: ALDACTONE Take 1  tablet (25 mg total) by mouth daily.   tamsulosin 0.4 MG Caps capsule Commonly known as: FLOMAX Take 1 capsule (0.4 mg total) by mouth daily.   Vitamin D-3 125 MCG (5000 UT) Tabs Take 5,000 Units by mouth every morning.   warfarin 5 MG tablet Commonly known as: COUMADIN Take 0.5 tablets (2.5 mg total) by mouth daily. What changed:  how much to take when to take this additional instructions       No Known Allergies  Follow-up Information     Burnard Bunting, MD Follow up.   Specialty: Internal Medicine Why: Please follow up in a week. Contact information: [address] [employer] [place] [address] [phone]         [name] T, PA-C Follow up.   Specialties: Cardiology, Physician Assistant Why: [place] location - we moved up your post-hospital visit with [name] to Monday Dec 05, 2021 at 10:05 AM (Arrive by 9:50 AM). Contact information: [address] [place] [address] [phone]                  The  results of significant diagnostics from this hospitalization (including imaging, microbiology, ancillary and laboratory) are listed below for reference.    Significant Diagnostic Studies: US Abdomen Limited RUQ (LIVER/GB)  Result Date: 11/24/2021 CLINICAL DATA:  Cirrhosis EXAM: ULTRASOUND ABDOMEN LIMITED RIGHT UPPER QUADRANT COMPARISON:  02/08/2018 CT abdomen pelvis FINDINGS: Gallbladder: Surgically absent. Common bile duct: Diameter: 6 mm within normal limits given prior cholecystectomy. No intrahepatic biliary ductal dilatation. Liver: No focal lesion identified. Nodularity, which is most noted in the left hepatic lobe. Heterogeneous and increased parenchymal echogenicity. Portal vein is patent on color Doppler imaging with normal direction of blood flow towards the liver. Other: Pleural effusion. IMPRESSION: 1. Hepatic cirrhosis. No focal liver lesion. 2. Pleural effusion. Electronically Signed   By:  Merilyn Baba M.D.   On: 11/24/2021 12:01   DG Chest 2 View  Result Date: 11/21/2021 CLINICAL DATA:  Chest pain x1 week EXAM: CHEST - 2 VIEW COMPARISON:  Previous studies including the examination of 10/05/2021 FINDINGS: Transverse diameter of heart is increased. Central pulmonary vessels are more prominent. Increased interstitial and alveolar markings are seen in parahilar regions and lower lung fields. Small to moderate bilateral pleural effusions are seen, more so on the left side. There is prosthetic stent in the region of aortic valve. There is no pneumothorax. IMPRESSION: Cardiomegaly. Central pulmonary vessels are prominent suggesting CHF. Increased markings in the lower lung fields may suggest pulmonary edema or pneumonia. Small to moderate bilateral pleural effusions, more so on the left side with interval increase. Electronically Signed   By: Elmer Picker M.D.   On: 11/21/2021 08:00    Microbiology: Recent Results (from the past 240 hour(s))  MRSA Next Gen by PCR, Nasal     Status: Abnormal   Collection Time: 11/22/21  8:59 PM   Specimen: Nasal Mucosa; Nasal Swab  Result Value Ref Range Status   MRSA by PCR Next Gen DETECTED (A) NOT DETECTED Final    Comment: CRITICAL RESULT CALLED TO, READ BACK BY AND VERIFIED WITH:  C/ Florence Canner, RN 11/22/21 2305 A. LAFRANCE (NOTE) The GeneXpert MRSA Assay (FDA approved for NASAL specimens only), is one component of a comprehensive MRSA colonization surveillance program. It is not intended to diagnose MRSA infection nor to guide or monitor treatment for MRSA infections. Test performance is not FDA approved in patients less than 80 years old. Performed at [facility], [place] [address] [place], [place] [address]      Labs: Basic Metabolic Panel: Recent Labs  Lab 11/21/21 0658 11/22/21 0340 11/23/21 0455 11/24/21 0040 11/25/21 0121  NA 137 138 138 138 139  K 4.1 3.9 3.8 4.0 4.0  CL 105 105 102 104 106  CO2 '22 27 26 26 26   '$ GLUCOSE 90 104* 105* 96 97  BUN 29* 27* 23 29* 31*  CREATININE 1.15 1.16 1.07 1.37* 1.10  CALCIUM 9.3 8.6* 8.9 8.8* 8.6*  MG  --  2.4 2.3  --   --    Liver Function Tests: Recent Labs  Lab 11/21/21 0658  AST 24  ALT 14  ALKPHOS 84  BILITOT 1.3*  PROT 6.5  ALBUMIN 3.2*   No results for input(s): "LIPASE", "AMYLASE" in the last 168 hours. No results for input(s): "AMMONIA" in the last 168 hours. CBC: Recent Labs  Lab 11/21/21 0658 11/22/21 0340 11/23/21 0455 11/24/21 0040  WBC 4.2 4.8 5.4 5.6  HGB 11.8* 11.2* 12.0* 11.5*  HCT 35.6* 35.4* 36.0* 36.2*  MCV 89.4 90.5 86.7 89.6  PLT 122* 125* 128* 134*   Cardiac Enzymes: No results for input(s): "CKTOTAL", "CKMB", "CKMBINDEX", "TROPONINI" in the last 168 hours. BNP: BNP (last 3 results) Recent Labs    05/17/21 1305 11/21/21 0658  BNP 804.4* 1,254.9*    ProBNP (last 3 results) No results for input(s): "PROBNP" in the last 8760 hours.  CBG: No results for input(s): "GLUCAP" in the last 168 hours.     Signed:  Domenic Polite MD.  [facility] 11/25/2021, 10:00 AM

## 2021-11-25 NOTE — Progress Notes (Signed)
   11/25/21 1020  Mobility  Activity Ambulated with assistance in hallway  Level of Assistance Contact guard assist, steadying assist  Assistive Device Four wheel walker  Distance Ambulated (ft) 550 ft  Activity Response Tolerated well  Mobility Referral Yes  $Mobility charge 1 Mobility   Mobility Specialist Progress Note  Received pt in bed having no complaints and agreeable to mobility. Pt was asymptomatic throughout ambulation and returned to room w/o fault. Left in bed w/ call bell in reach and all needs met.   Lucious Groves Mobility Specialist

## 2021-11-25 NOTE — Progress Notes (Signed)
Patient had two more pauses of 2.3 seconds and then dropped heart rate to 38, patient asymptomatic. Notified MD and cardiology.

## 2021-11-25 NOTE — Progress Notes (Signed)
Progress Note  Patient Name: Tyler Williams Date of Encounter: 11/25/2021  Primary Cardiologist: Sherren Mocha, MD  Subjective   No acute events overnight. Nausea resolved. Breathing is better, as is LE swelling.   Inpatient Medications    Scheduled Meds:  atorvastatin  20 mg Oral QPM   Chlorhexidine Gluconate Cloth  6 each Topical Q0600   finasteride  5 mg Oral Daily   furosemide  40 mg Oral Daily   isosorbide mononitrate  60 mg Oral Daily   metoprolol succinate  12.5 mg Oral Daily   mupirocin ointment  1 Application Nasal BID   sacubitril-valsartan  1 tablet Oral BID   spironolactone  25 mg Oral Daily   tamsulosin  0.4 mg Oral Daily   warfarin  2.5 mg Oral ONCE-1600   Warfarin - Pharmacist Dosing Inpatient   Does not apply q1600   Continuous Infusions:  PRN Meds: acetaminophen **OR** acetaminophen, albuterol, LORazepam, ondansetron **OR** ondansetron (ZOFRAN) IV   Vital Signs    Vitals:   11/24/21 1106 11/24/21 1404 11/24/21 2012 11/25/21 0330  BP: (!) 147/62 (!) 95/44 (!) 129/57 (!) 142/60  Pulse: 67 (!) 54 (!) 56 (!) 52  Resp: '20 16 20 '$ (!) 22  Temp: 98 F (36.7 C)  98.1 F (36.7 C) 97.6 F (36.4 C)  TempSrc: Oral  Oral Oral  SpO2: 95% 92% 95% 95%  Weight:    74.6 kg  Height:        Intake/Output Summary (Last 24 hours) at 11/25/2021 0958 Last data filed at 11/25/2021 5366 Gross per 24 hour  Intake 720 ml  Output 200 ml  Net 520 ml      11/25/2021    3:30 AM 11/24/2021    5:07 AM 11/23/2021   12:25 AM  Last 3 Weights  Weight (lbs) 164 lb 6.4 oz 164 lb 11.2 oz 172 lb 6.4 oz  Weight (kg) 74.571 kg 74.707 kg 78.2 kg     Telemetry    Atrial fib, occasional PVCs - Personally Reviewed  Physical Exam   GEN: Well nourished, well developed in no acute distress NECK: No JVD CARDIAC: irregularly irregular rhythm, normal S1 and S2, no rubs or gallops. 2/6 systolic murmur. VASCULAR: Radial pulses 2+ bilaterally.  RESPIRATORY:  Clear to  auscultation without rales, wheezing or rhonchi  ABDOMEN: Soft, non-tender, non-distended MUSCULOSKELETAL:  Moves all 4 limbs independently SKIN: Warm and dry, nearly resolved brawny edema with significant wrinkling in BL LE NEUROLOGIC:  No focal neuro deficits noted. PSYCHIATRIC:  Normal affect    Labs    High Sensitivity Troponin:   Recent Labs  Lab 11/21/21 0658 11/21/21 0913  TROPONINIHS 31* 30*      Cardiac EnzymesNo results for input(s): "TROPONINI" in the last 168 hours. No results for input(s): "TROPIPOC" in the last 168 hours.   Chemistry Recent Labs  Lab 11/21/21 0658 11/22/21 0340 11/23/21 0455 11/24/21 0040 11/25/21 0121  NA 137   < > 138 138 139  K 4.1   < > 3.8 4.0 4.0  CL 105   < > 102 104 106  CO2 22   < > '26 26 26  '$ GLUCOSE 90   < > 105* 96 97  BUN 29*   < > 23 29* 31*  CREATININE 1.15   < > 1.07 1.37* 1.10  CALCIUM 9.3   < > 8.9 8.8* 8.6*  PROT 6.5  --   --   --   --   ALBUMIN 3.2*  --   --   --   --  AST 24  --   --   --   --   ALT 14  --   --   --   --   ALKPHOS 84  --   --   --   --   BILITOT 1.3*  --   --   --   --   GFRNONAA 59*   < > >60 48* >60  ANIONGAP 10   < > '10 8 7   '$ < > = values in this interval not displayed.     Hematology Recent Labs  Lab 11/22/21 0340 11/23/21 0455 11/24/21 0040  WBC 4.8 5.4 5.6  RBC 3.91* 4.15* 4.04*  HGB 11.2* 12.0* 11.5*  HCT 35.4* 36.0* 36.2*  MCV 90.5 86.7 89.6  MCH 28.6 28.9 28.5  MCHC 31.6 33.3 31.8  RDW 16.8* 16.8* 16.6*  PLT 125* 128* 134*    BNP Recent Labs  Lab 11/21/21 0658  BNP 1,254.9*     DDimer No results for input(s): "DDIMER" in the last 168 hours.   Radiology    US Abdomen Limited RUQ (LIVER/GB)  Result Date: 11/24/2021 CLINICAL DATA:  Cirrhosis EXAM: ULTRASOUND ABDOMEN LIMITED RIGHT UPPER QUADRANT COMPARISON:  02/08/2018 CT abdomen pelvis FINDINGS: Gallbladder: Surgically absent. Common bile duct: Diameter: 6 mm within normal limits given prior cholecystectomy. No  intrahepatic biliary ductal dilatation. Liver: No focal lesion identified. Nodularity, which is most noted in the left hepatic lobe. Heterogeneous and increased parenchymal echogenicity. Portal vein is patent on color Doppler imaging with normal direction of blood flow towards the liver. Other: Pleural effusion. IMPRESSION: 1. Hepatic cirrhosis. No focal liver lesion. 2. Pleural effusion. Electronically Signed   By: Merilyn Baba M.D.   On: 11/24/2021 12:01    Cardiac Studies   Echo 07/06/2021   1. Left ventricular ejection fraction, by estimation, is 45 to 50%. The  left ventricle has mildly decreased function. The left ventricle  demonstrates regional wall motion abnormalities (see scoring  diagram/findings for description). There is mild  concentric left ventricular hypertrophy. Left ventricular diastolic  function could not be evaluated. There is severe hypokinesis of the left  ventricular, basal-mid inferior wall and inferolateral wall.   2. Right ventricular systolic function is mildly reduced. The right  ventricular size is mildly enlarged. There is severely elevated pulmonary  artery systolic pressure.   3. Left atrial size was severely dilated.   4. Right atrial size was severely dilated.   5. The mitral valve is normal in structure. Mild to moderate mitral valve  regurgitation. No evidence of mitral stenosis. Moderate mitral annular  calcification.   6. Tricuspid valve regurgitation is moderate.   7. There is trivial perivalvular leak at the posterior annulus. The  aortic valve has been repaired/replaced. Aortic valve regurgitation is  trivial. There is a 29 mm Sapien prosthetic (TAVR) valve present in the  aortic position. Procedure Date: 2018.  Aortic valve mean gradient measures 6.0 mmHg. Aortic valve Vmax measures  1.64 m/s. Aortic valve acceleration time measures 88 msec.   8. The inferior vena cava is dilated in size with <50% respiratory  variability, suggesting right  atrial pressure of 15 mmHg.   Comparison(s): The left ventricular function is unchanged. The left  ventricular wall motion abnormality is unchanged. TAVR parameters are  unchanged. The estimated systolic PA pressure is higher.   Patient Profile     88 y.o. male with  permanent atrial fibrillation, CAD s/p POBA 1980s, 1990s (details unclear), known stenotic RCA (previous  unsuccessful intervention), chronic HFmrEF, severe AS s/p TAVR 06/2016, mild-moderate MR by echo 06/2021, severe pulmonary HTN, PVD with AAA s/p repair 1993, bilateral renal artery atherosclerosis, SMA stenosis with collateralization, mild carotid artery disease (1-39% 2018), historical PVCs on telemetry, venous insufficiency, probable CKD stage 3a, urinary rentention, aortic atherosclerosis, very remote PE (1964), arthritis, GERD, macular degeneration, RBBB, cirrhotic morphology on liver on CT, colon mass by CT 2018, remote SSS. Admitted with worsening SOB, edema, weight gain and chest tightness.  Assessment & Plan    Chest tightness with suspected acute on chronic HFmrEF, known severe pulmonary HTN - improving with diuresis - I/O's and weights not well tracked in ER setting but has had brisk urine output - continue home Entresto, metoprolol, Imdur, spironolactone - admission weight 81.6 kg, current weight 74.6 kg, net negative tracked at 4.8L but likely more given urine output - Tolerating oral lasix with improvement in Cr   Known CAD s/p remote POBAs, stenotic RCA treated medically - hsTroponin low/flat, not specifically suggestive of ACS - likely demand due to CHF - not on ASA with concomitant warfarin - continue BB, statin   Permanent atrial fib with occasional PVCs (known) - on chronic warfarin for anticoagulation. Appreciate pharmacy management of dosing. - continue rate control strategy with metoprolol   PVD -follow clinically as OP f/u   Essential HTN -  manage in context above   Severe AS s/p TAVR 2018,  mild-moderate MR - continue volume control as above- - recent echo 06/2021, will defer to MD if repeat needed   Per primary team: Anemia/thrombocytopenia Prior abnormal liver imaging  [place] will sign off.   Medication Recommendations:   Continue: atorvastatin, furosemide, imdur, metoprolol, entresto, spironolactone as currently ordered. Warfarin per pharmacy recommendations. Other recommendations (labs, testing, etc):  none Follow up as an outpatient:  He has appt with [name] on 12/05/21.     For questions or updates, please contact [place] Please consult [email or web address] for contact info under Cardiology/STEMI.  Signed, Buford Dresser, MD 11/25/2021, 9:58 AM

## 2021-11-25 NOTE — Care Management Important Message (Signed)
Important Message  Patient Details  Name: Zaid Tomes MRN: [id number] Date of Birth: 12/23/1926   Medicare Important Message Given:  Yes     Shelda Altes 11/25/2021, 8:21 AM

## 2021-11-25 NOTE — Progress Notes (Addendum)
ANTICOAGULATION CONSULT NOTE  Pharmacy Consult:  Coumadin Indication: atrial fibrillation  No Known Allergies  Patient Measurements: Height: '5\' 8"'$  (172.7 cm) Weight: 74.6 kg (164 lb 6.4 oz) IBW/kg (Calculated) : 68.4  Vital Signs: Temp: 97.6 F (36.4 C) (10/13 0330) Temp Source: Oral (10/13 0330) BP: 142/60 (10/13 0330) Pulse Rate: 52 (10/13 0330)  Labs: Recent Labs    11/23/21 0455 11/24/21 0040 11/25/21 0121  HGB 12.0* 11.5*  --   HCT 36.0* 36.2*  --   PLT 128* 134*  --   LABPROT 26.0* 24.2* 27.5*  INR 2.4* 2.2* 2.6*  CREATININE 1.07 1.37* 1.10     Estimated Creatinine Clearance: 40.6 mL/min (by C-G formula based on SCr of 1.1 mg/dL).  Assessment: Patient is a 88 y.o male admitted with CC of chest pain. History of permanent atrial fibrillation for which he is on warfarin. Home warfarin regimen is 7.'5mg'$  on Monday and '5mg'$  all other days, and it appears that patient needs a dose reduction.  INR at 3.2 on admission on home regimen.  INR therapeutic at 2.6.  No bleeding reported.  Goal of Therapy:  INR 2-3 Monitor platelets by anticoagulation protocol: Yes   Plan:  Try Coumadin 2.'5mg'$  PO today Daily PT / INR  Orris Perin D. Mina Marble, PharmD, BCPS, [place] 11/25/2021, 7:10 AM

## 2021-11-25 NOTE — NC FL2 (Addendum)
[place] LEVEL OF CARE SCREENING TOOL     IDENTIFICATION  Patient Name: Tyler Williams Birthdate: 05/22/1926 Sex: male Admission Date (Current Location): 11/21/2021  [facility] and [place] Number:  Herbalist and Address:  [facility] [place]. [facility], [place] [address] [place], [place] [address]      Provider Number: [id number]  Attending Physician Name and Address:  Domenic Polite, MD  Relative Name and Phone Number:  [place], [place])   [phone] [facility])    Current Level of Care: Hospital Recommended Level of Care: [place] Prior Approval Number:    Date Approved/Denied:   PASRR Number:    Discharge Plan: Other (Comment) (ALF)    Current Diagnoses: Patient Active Problem List   Diagnosis Date Noted   Chest pain 11/21/2021   Elevated troponin 11/21/2021   Normocytic anemia 11/21/2021   Chronic kidney disease, stage III (moderate) ([place]) 11/21/2021   Thrombocytopenia ([place]) 11/21/2021   BPH (benign prostatic hyperplasia) 11/21/2021   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) 10/05/2021   Hypotension 10/05/2021   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   Pain of left hand 08/12/2020   Abrasion of unspecified back wall of thorax, initial encounter 07/23/2020   Bite of nonvenomous arthropod 07/23/2020   Fall 07/23/2020   Hearing loss 07/23/2020   Laceration without foreign body of right elbow, initial encounter 07/23/2020   Influenza A    Gross hematuria    DCM (dilated cardiomyopathy) ([place])    Chronic atrial fibrillation ([place])    Congestive heart failure ([place]) 06/12/2020   Dyspnea 04/24/2019   Chronic obstructive pulmonary disease ([place]) 06/10/2018   Hardening of the aorta (main artery of the heart) ([place]) 06/10/2018   Chronic kidney disease, stage 2 (mild) 04/24/2018   Hypertensive heart and chronic kidney disease with heart failure and stage 1 through stage 4 chronic kidney disease, or unspecified chronic  kidney disease ([place]) 04/24/2018   Aorta aneurysm ([place]) 02/14/2018   Cirrhosis of liver ([place]) 02/08/2018   Constipation 01/15/2018   Acute on chronic combined systolic and diastolic CHF (congestive heart failure) ([place]) 09/25/2017   Precordial chest pain 08/28/2017   HLD (hyperlipidemia) 08/28/2017   Anticoagulated on Coumadin 08/28/2017   HTN (hypertension) 08/28/2017   S/P TAVR (transcatheter aortic valve replacement)    Presence of prosthetic heart valve 09/06/2016   S/P TAVR (transcatheter aortic valve replacement) 06/13/2016   Incidental cecal mass noted on CT imaging 05/24/2016   Inguinal hernia 02/24/2016   Exudative age-related macular 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 ([place]) 12/22/2014   Non-thrombocytopenic purpura ([place]) 08/19/2014   Encounter for therapeutic drug monitoring 03/20/2013   Pseudophakia of both eyes 03/19/2013   Macular degeneration 03/19/2013   Cholecystitis, acute 12/06/2012   Chest pain, atypical 12/03/2012   Hematuria, microscopic 12/03/2012   Anxiety disorder 06/20/2012   Long term (current) use of anticoagulants 06/20/2012   Cystoid macular edema 05/14/2012   Overweight (BMI 25.0-29.9) 01/23/2012   Hyponatremia 01/23/2012   S/P right UKR 01/22/2012   Carotid artery bruit 11/21/2011   Carotid bruit 11/21/2011   Presence of intraocular lens 02/16/2011   Osteoarthritis 07/05/2010   EDEMA 07/14/2009   Essential hypertension 01/27/2009   HYPERCHOLESTEROLEMIA  IIA 10/21/2008   Coronary artery disease involving native coronary artery with angina pectoris ([place]) 10/21/2008   Permanent atrial fibrillation ([place]) 10/21/2008    Orientation RESPIRATION BLADDER Height & Weight     Self, Time, Situation, Place  Normal Incontinent Weight:  164 lb 6.4 oz (74.6 kg) Height:  '5\' 8"'$  (172.7 cm)  BEHAVIORAL SYMPTOMS/MOOD NEUROLOGICAL BOWEL NUTRITION STATUS      Continent Diet (Regular diet- no salt added)  AMBULATORY STATUS  COMMUNICATION OF NEEDS Skin   Supervision Verbally                         Personal Care Assistance Level of Assistance  Bathing, Feeding, Dressing Bathing Assistance: Limited assistance Feeding assistance: Independent Dressing Assistance: Limited assistance     Functional Limitations Info  Sight, Hearing, Speech Sight Info: Impaired Hearing Info: Impaired Speech Info: Adequate    SPECIAL CARE FACTORS FREQUENCY  PT (By licensed PT), OT (By licensed OT)     PT Frequency: 2-3x/week OT Frequency: 2-3x/week            Contractures Contractures Info: Not present    Additional Factors Info  Code Status, Allergies (Contact precautions- positive for MRSA) Code Status Info: Full code Allergies Info: no known allergies              Discharge Medications: Medication List       STOP taking these medications     amoxicillin-clavulanate 875-125 MG tablet Commonly known as: AUGMENTIN    doxycycline 100 MG tablet Commonly known as: ADOXA           TAKE these medications     atorvastatin 20 MG tablet Commonly known as: LIPITOR TAKE 1 TABLET ONCE DAILY. What changed: when to take this    CAL-MAG-ZINC PO Take 1 tablet by mouth every morning.    Entresto 24-26 MG Generic drug: sacubitril-valsartan Take 1 tablet by mouth 2 (two) times daily. Please keep upcoming appointment for future refills thank you.    fesoterodine 8 MG Tb24 tablet Commonly known as: TOVIAZ Take 8 mg by mouth every evening.    finasteride 5 MG tablet Commonly known as: PROSCAR Take 1 tablet (5 mg total) by mouth daily.    furosemide 40 MG tablet Commonly known as: LASIX Take 1 tablet (40 mg total) by mouth daily.    isosorbide mononitrate 60 MG 24 hr tablet Commonly known as: IMDUR Take 1 tablet (60 mg total) by mouth daily. What changed: how much to take    LORazepam 1 MG tablet Commonly known as: ATIVAN Take 1 mg by mouth 2 (two) times daily as needed for anxiety.     metoprolol succinate 25 MG 24 hr tablet Commonly known as: Toprol XL Take 0.5 tablets (12.5 mg total) by mouth daily.    nitroGLYCERIN 0.4 MG SL tablet Commonly known as: NITROSTAT Place 0.4 mg under the tongue every 5 (five) minutes as needed for chest pain.    PreserVision AREDS Tabs Take 1 tablet by mouth daily.    spironolactone 25 MG tablet Commonly known as: ALDACTONE Take 1 tablet (25 mg total) by mouth daily.    tamsulosin 0.4 MG Caps capsule Commonly known as: FLOMAX Take 1 capsule (0.4 mg total) by mouth daily.    Vitamin D-3 125 MCG (5000 UT) Tabs Take 5,000 Units by mouth every morning.    warfarin 5 MG tablet Commonly known as: COUMADIN Take 0.5 tablets (2.5 mg total) by mouth daily. What changed:  how much to take when to take this additional instructions    Relevant Imaging Results:  Relevant Lab Results:   Additional Information SSN# [id number]  Bethann Berkshire, LCSW

## 2021-11-28 NOTE — Telephone Encounter (Signed)
Follow Up:      Patient's son is returning Lynn's call from today.

## 2021-11-28 NOTE — Telephone Encounter (Signed)
**Note De-Identified  Obfuscation** Transition Care Management Unsuccessful Follow-up Telephone Call  Date of discharge and from where:  11/26/2021 from [facility].  Attempts:  1st Attempt  Reason for unsuccessful TCM follow-up call:  No answer on the pts cell phone so I left a message asking him to call Jeani Hawking back at Dr York Cerise office at [facility] at [phone]. I called his home number and he answered my call but stated he had another call coming in so he could not s/w me at the moment. He requested that I call him back later.

## 2021-11-29 DIAGNOSIS — D649 Anemia, unspecified: Secondary | ICD-10-CM | POA: Diagnosis not present

## 2021-11-29 DIAGNOSIS — D696 Thrombocytopenia, unspecified: Secondary | ICD-10-CM | POA: Diagnosis not present

## 2021-11-29 DIAGNOSIS — I272 Pulmonary hypertension, unspecified: Secondary | ICD-10-CM | POA: Diagnosis not present

## 2021-11-29 DIAGNOSIS — H9190 Unspecified hearing loss, unspecified ear: Secondary | ICD-10-CM | POA: Diagnosis not present

## 2021-11-29 DIAGNOSIS — I420 Dilated cardiomyopathy: Secondary | ICD-10-CM | POA: Diagnosis not present

## 2021-11-29 DIAGNOSIS — H3530 Unspecified macular degeneration: Secondary | ICD-10-CM | POA: Diagnosis not present

## 2021-11-29 DIAGNOSIS — K7460 Unspecified cirrhosis of liver: Secondary | ICD-10-CM | POA: Diagnosis not present

## 2021-11-29 DIAGNOSIS — K219 Gastro-esophageal reflux disease without esophagitis: Secondary | ICD-10-CM | POA: Diagnosis not present

## 2021-11-29 DIAGNOSIS — Z7901 Long term (current) use of anticoagulants: Secondary | ICD-10-CM | POA: Diagnosis not present

## 2021-11-29 DIAGNOSIS — I2510 Atherosclerotic heart disease of native coronary artery without angina pectoris: Secondary | ICD-10-CM | POA: Diagnosis not present

## 2021-11-29 DIAGNOSIS — L97821 Non-pressure chronic ulcer of other part of left lower leg limited to breakdown of skin: Secondary | ICD-10-CM | POA: Diagnosis not present

## 2021-11-29 DIAGNOSIS — Z952 Presence of prosthetic heart valve: Secondary | ICD-10-CM | POA: Diagnosis not present

## 2021-11-29 DIAGNOSIS — E785 Hyperlipidemia, unspecified: Secondary | ICD-10-CM | POA: Diagnosis not present

## 2021-11-29 DIAGNOSIS — I872 Venous insufficiency (chronic) (peripheral): Secondary | ICD-10-CM | POA: Diagnosis not present

## 2021-11-29 DIAGNOSIS — I4821 Permanent atrial fibrillation: Secondary | ICD-10-CM | POA: Diagnosis not present

## 2021-11-29 DIAGNOSIS — N1831 Chronic kidney disease, stage 3a: Secondary | ICD-10-CM | POA: Diagnosis not present

## 2021-11-29 DIAGNOSIS — F419 Anxiety disorder, unspecified: Secondary | ICD-10-CM | POA: Diagnosis not present

## 2021-11-29 DIAGNOSIS — N400 Enlarged prostate without lower urinary tract symptoms: Secondary | ICD-10-CM | POA: Diagnosis not present

## 2021-11-29 DIAGNOSIS — I495 Sick sinus syndrome: Secondary | ICD-10-CM | POA: Diagnosis not present

## 2021-11-29 DIAGNOSIS — I5042 Chronic combined systolic (congestive) and diastolic (congestive) heart failure: Secondary | ICD-10-CM | POA: Diagnosis not present

## 2021-11-29 DIAGNOSIS — I739 Peripheral vascular disease, unspecified: Secondary | ICD-10-CM | POA: Diagnosis not present

## 2021-11-29 DIAGNOSIS — I959 Hypotension, unspecified: Secondary | ICD-10-CM | POA: Diagnosis not present

## 2021-11-29 DIAGNOSIS — I130 Hypertensive heart and chronic kidney disease with heart failure and stage 1 through stage 4 chronic kidney disease, or unspecified chronic kidney disease: Secondary | ICD-10-CM | POA: Diagnosis not present

## 2021-11-29 DIAGNOSIS — M1990 Unspecified osteoarthritis, unspecified site: Secondary | ICD-10-CM | POA: Diagnosis not present

## 2021-11-29 DIAGNOSIS — I4510 Unspecified right bundle-branch block: Secondary | ICD-10-CM | POA: Diagnosis not present

## 2021-11-29 NOTE — Telephone Encounter (Signed)
**Note De-Identified  Obfuscation** Transition Care Management Unsuccessful Follow-up Telephone Call  Date of discharge and from where:  11/25/2021 from [facility].  Attempts:  2nd Attempt  Reason for unsuccessful TCM follow-up call:  Left voice message asking the pt or the pts son, [name] to call Jeani Hawking back at Dr York Cerise office at [facility] at [phone].

## 2021-11-30 DIAGNOSIS — L89301 Pressure ulcer of unspecified buttock, stage 1: Secondary | ICD-10-CM | POA: Diagnosis not present

## 2021-11-30 DIAGNOSIS — Z66 Do not resuscitate: Secondary | ICD-10-CM | POA: Diagnosis not present

## 2021-11-30 DIAGNOSIS — N1831 Chronic kidney disease, stage 3a: Secondary | ICD-10-CM | POA: Diagnosis not present

## 2021-11-30 DIAGNOSIS — I2510 Atherosclerotic heart disease of native coronary artery without angina pectoris: Secondary | ICD-10-CM | POA: Diagnosis not present

## 2021-11-30 DIAGNOSIS — Z5181 Encounter for therapeutic drug level monitoring: Secondary | ICD-10-CM | POA: Diagnosis not present

## 2021-11-30 DIAGNOSIS — I4821 Permanent atrial fibrillation: Secondary | ICD-10-CM | POA: Diagnosis not present

## 2021-11-30 DIAGNOSIS — Z7901 Long term (current) use of anticoagulants: Secondary | ICD-10-CM | POA: Diagnosis not present

## 2021-11-30 DIAGNOSIS — K7460 Unspecified cirrhosis of liver: Secondary | ICD-10-CM | POA: Diagnosis not present

## 2021-11-30 DIAGNOSIS — N400 Enlarged prostate without lower urinary tract symptoms: Secondary | ICD-10-CM | POA: Diagnosis not present

## 2021-11-30 DIAGNOSIS — I5043 Acute on chronic combined systolic (congestive) and diastolic (congestive) heart failure: Secondary | ICD-10-CM | POA: Diagnosis not present

## 2021-11-30 DIAGNOSIS — I130 Hypertensive heart and chronic kidney disease with heart failure and stage 1 through stage 4 chronic kidney disease, or unspecified chronic kidney disease: Secondary | ICD-10-CM | POA: Diagnosis not present

## 2021-11-30 DIAGNOSIS — I272 Pulmonary hypertension, unspecified: Secondary | ICD-10-CM | POA: Diagnosis not present

## 2021-11-30 NOTE — Telephone Encounter (Signed)
**Note De-Identified  Obfuscation** Transition Care Management Unsuccessful Follow-up Telephone Call  Date of discharge and from where:  11/25/2021 from [facility]  Attempts:  3rd Attempt  Reason for unsuccessful TCM follow-up call:  Unable to reach patient I did s/w the pts son but there is no DPR on file. He stated that his Father was discharged to a SNF, [facility] and gave me a number to reach him at. I called [phone] but got no answer and no way to leave a VM.  Since the pt was discharged to a SNF, a TCM call is not required.

## 2021-12-01 DIAGNOSIS — I5042 Chronic combined systolic (congestive) and diastolic (congestive) heart failure: Secondary | ICD-10-CM | POA: Diagnosis not present

## 2021-12-01 DIAGNOSIS — L97821 Non-pressure chronic ulcer of other part of left lower leg limited to breakdown of skin: Secondary | ICD-10-CM | POA: Diagnosis not present

## 2021-12-01 DIAGNOSIS — I872 Venous insufficiency (chronic) (peripheral): Secondary | ICD-10-CM | POA: Diagnosis not present

## 2021-12-01 DIAGNOSIS — N1831 Chronic kidney disease, stage 3a: Secondary | ICD-10-CM | POA: Diagnosis not present

## 2021-12-01 DIAGNOSIS — I2510 Atherosclerotic heart disease of native coronary artery without angina pectoris: Secondary | ICD-10-CM | POA: Diagnosis not present

## 2021-12-01 DIAGNOSIS — I130 Hypertensive heart and chronic kidney disease with heart failure and stage 1 through stage 4 chronic kidney disease, or unspecified chronic kidney disease: Secondary | ICD-10-CM | POA: Diagnosis not present

## 2021-12-04 NOTE — Progress Notes (Unsigned)
Cardiology Office Note:    Date:  12/05/2021   ID:  Tyler Williams, DOB [birth year], MRN [id number]  PCP:  Burnard Bunting, MD  [place] Providers Cardiologist:  Sherren Mocha, MD     Referring MD: Burnard Bunting, MD   Chief Complaint:  Hospitalization Follow-up (Admitted with CHF)    Patient Profile: Permanent atrial fibrillation Coumadin anticoagulation Coronary artery disease S/p POBA in the 1980s, 1990s Known high-grade stenosis of mid RCA; unsuccessful attempted PCI in past Cath 7/19: Mid RCA, 85 >>unsuccessful PCI attempt to the RCA HFmrEF (heart failure with mildly reduced ejection fraction)  Echo 4/22: EF 45-50 with inferior and inferolateral HK, GRII DD Echocardiogram 5/23: EF 45-50, inf and post HK, mild LVH, mildly reduced RVSF, severe BAE, mild to mod MR, trivial AI, RVSP 64  Mild to mod MR (echocardiogram 06/2021) Severe pulmonary hypertension (Echocardiogram 06/2021) Peripheral arterial disease AAA s/p repair Aortic stenosis S/p TAVR 06/2016 Venous insufficiency  Aortic atherosclerosis History of urinary retention Cirrhosis Noted on [place] in 11/2021 during admit - conservative management   Prior CV Studies:   ECHO COMPLETE WO IMAGING ENHANCING AGENT 07/06/2021 EF 45-50, inferior and posterior wall HK, mild concentric LVH, mild reduced RVSF, severely elevated PASP (RVSP 64), severe BAE, mild to moderate MR, moderate TR, s/p TAVR with trivial perivalvular leak and mean gradient 6    Echocardiogram 06/12/20 EF 45-50, mild concentric LVH, GRII DD, inferior and inferolateral HK, mildly reduced RVSF, severe LAE, moderate RAE, mild-moderate MR, TAVR with trivial PVL and mean gradient 6   Cardiac catheterization 08/31/2017 LM mid 30 LAD normal LCx normal RCA mid 95, 85 LVEDP 12 PCI: Unsuccessful to the RCA   Echo 08/28/2017 Mild LVH, EF 55-60, inferolateral akinesis, bioprosthetic aortic valve with trivial AI, MAC, mild MR, severe LAE, moderate  RAE, PASP 43   24-hour Holter 02/01/2017 The basic rhythm is marked sinus bradycardia with average HR 48 bpm There are PVC's, but no sustained arrhythmia No atrial fibrillation noted No tachy-arrhythmias   Carotid US 12/04/2016 Bilateral ICA 1-39  History of Present Illness:   Tyler Williams is a 88 y.o. male with the above problem list.  He was last seen in clinic by Dr. Burt Knack. The patient had been seen prior to that with low BP and ?pneumonia on his CXR. He was tx with antibiotics and some of his HF meds were reduced. He was improved and feeling better when he saw Dr. Burt Knack with plans for 3 mos f/u.  He was admitted to [facility] 10/9-10/13 decompensated HFmrEF (heart failure with mildly reduced ejection fraction). His hsTrops were minimally elevated and flat (not c/w ACS). He improved with IV diuresis and DC weight was 74.6 kg (164 lbs).  He returns for f/u.  He is here today with his son.  He is currently living at [facility] assisted living.  Since discharge from the hospital, his weight has increased about 5 pounds.  He has been given extra furosemide twice.  Overall, his lower extremity edema remains improved.  His breathing also remains improved.  He has not had chest pain, orthopnea, syncope.    Past Medical History:  Diagnosis Date   AAA (abdominal aortic aneurysm) (HCC)    s/p repair   Aortic atherosclerosis (HCC)    Arthritis    knees   CAD (coronary artery disease)    a. s/p POBA 1980s, 1990s (details unclear). b. known stenotic RCA (previous unsuccessful intervention)   Cancer ([place])    skin  cancer removed, right hand, left leg, chest   Carotid artery disease (HCC)    Chronic anticoagulation    Coumadin   Chronic kidney disease, stage 3a (HCC)    Cirrhosis of liver ([place])    by imaging   Coronary artery disease    GERD (gastroesophageal reflux disease)    Heart failure with mildly reduced ejection fraction (HFmrEF) (HCC)    History of pulmonary embolism     1964 related to dislocated hip on right    History of skin cancer    Hypercholesteremia    Hypertension    Incidental cecal mass noted on CT imaging 05/24/2016   **An incidental finding of potential clinical significance has been found. 4.6 x 5.6 x 3.7 cm masslike lesion in the cecum adjacent to the ileocecal valve may represent a large polyp or colonic neoplasm. 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   Calcium-Magnesium-Zinc (CAL-MAG-ZINC PO) Take 1 tablet by mouth every morning.   Cholecalciferol (VITAMIN D-3) 125 MCG (5000 UT) TABS Take 5,000 Units by mouth every morning.   fesoterodine (TOVIAZ) 8 MG TB24 tablet Take 8 mg by mouth every evening.   finasteride (PROSCAR) 5 MG tablet Take 1 tablet (5 mg total) by mouth daily.   furosemide (LASIX) 40 MG tablet Take 1.5 tablets (60 mg total) by mouth daily.   isosorbide mononitrate (IMDUR) 60 MG 24 hr tablet Take 1 tablet (60 mg total) by mouth daily.   LORazepam (ATIVAN) 1 MG tablet Take 1 mg by mouth 2 (two) times daily as needed for anxiety.   metoprolol succinate (TOPROL XL) 25 MG 24 hr tablet Take 0.5 tablets (12.5 mg total) by mouth daily.   Multiple Vitamins-Minerals (PRESERVISION AREDS) TABS Take 1 tablet  by mouth daily.   nitroGLYCERIN (NITROSTAT) 0.4 MG SL tablet Place 0.4 mg under the tongue every 5 (five) minutes as needed for chest pain.   nystatin cream (MYCOSTATIN) Apply 1 Application topically 2 (two) times daily.   Polyethylene Glycol 3350 POWD Take 1 Capful by mouth daily.   sacubitril-valsartan (ENTRESTO) 24-26 MG Take 1 tablet by mouth 2 (two) times daily. Please keep upcoming appointment for future refills thank you.   spironolactone (ALDACTONE) 25 MG tablet Take 1 tablet (25 mg total) by mouth daily.   tamsulosin (FLOMAX) 0.4 MG CAPS capsule Take 1 capsule (0.4 mg total) by mouth daily.   warfarin (COUMADIN) 2.5 MG tablet Take 2.5 mg by mouth Mon, Wed, and Fri   warfarin (COUMADIN) 2.5 MG tablet Take 1/2 tab by mouth on Tue,Thurs, Sat, and Sun   [DISCONTINUED] furosemide (LASIX) 40 MG tablet Take 1 tablet (40 mg total) by mouth daily.    Allergies:   Patient has no known allergies.  Social History   Tobacco Use   Smoking status: Never   Smokeless tobacco: Never  Vaping Use   Vaping Use: Never used  Substance Use Topics   Alcohol use: Not Currently    Comment: patient doesn't drink anymore   Drug use: Never    Family Hx: The patient's family history includes Heart attack (age of onset: 2) in his mother.  Review of Systems  Gastrointestinal:  Positive for diarrhea. Negative for vomiting.     EKGs/Labs/Other Test Reviewed:    EKG:  EKG is not ordered today.   Recent Labs: 11/21/2021: ALT 14; B Natriuretic Peptide 1,254.9 11/23/2021: Magnesium 2.3 11/24/2021: Hemoglobin 11.5; Platelets 134 12/05/2021: BUN 28; Creatinine, Ser 1.16; Potassium 4.1; Sodium 137   Recent Lipid Panel No results for input(s): "CHOL", "TRIG", "HDL", "VLDL", "LDLCALC", "LDLDIRECT" in the last 8760 hours.   Risk Assessment/Calculations/Metrics:    CHA2DS2-VASc Score = 5   This indicates a 7.2% annual risk of stroke. The patient's score is based upon: CHF History: 1 HTN History:  1 Diabetes History: 0 Stroke History: 0 Vascular Disease History: 1 Age Score: 2 Gender Score: 0             Physical Exam:    VS:  BP (!) 116/50   Pulse 68   Ht _0  (1.727 m)   Wt 176 lb (79.8 kg)   SpO2 94%   BMI 26.76 kg/m     Wt Readings from Last 3 Encounters:  12/05/21 176 lb (79.8 kg)  11/25/21 164 lb 6.4 oz (74.6 kg)  10/10/21 176 lb (79.8 kg)    Constitutional:      Appearance: Healthy appearance. Not in distress.  Neck:     Vascular: No JVR.  Pulmonary:     Effort: Pulmonary effort is normal.     Breath sounds: No wheezing. No rales.  Cardiovascular:     Normal rate. Irregularly irregular rhythm. Normal S1. Normal S2.      Murmurs: There is no murmur.  Edema:    Peripheral edema present.    Pretibial: bilateral 1+ edema of the pretibial area. Abdominal:     Palpations: Abdomen is soft.  Skin:    General: Skin is warm and dry.  Neurological:     General: No focal deficit present.     Mental Status: Alert and oriented to person, place and time.          ASSESSMENT & PLAN:   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) Ejection fraction 45-50 by echocardiogram in May 2023.  He was recently admitted with volume overload.  He was discharged on furosemide 40 mg daily.  He has been given extra furosemide twice since discharge.  His weight went up to about 5 pounds since discharge.  Previously, he was on furosemide 80 mg daily.  Medicines were reduced earlier this year because of hypotension in the setting of probable community-acquired pneumonia.  Increase furosemide to 60 mg daily.  Obtain BMET today and repeat a BMET in 1 week.  Continue Imdur 60 mg daily, Toprol-XL 12.5 mg daily, Entresto 24/26 mg twice daily, spironolactone 25 mg daily.  Follow-up in 4 to 6 weeks.  Permanent atrial fibrillation (HCC) Continue Coumadin as directed by Coumadin clinic.  PT/INR was obtained while he was here today.  Coronary artery disease involving native coronary artery  with angina pectoris ([place]) History of multiple PCI procedures in the past.  He has known high-grade disease in the RCA and prior PCI  attempt was unsuccessful.  He is currently doing well without anginal symptoms.  Continue Lipitor 20 mg daily, Imdur 60 mg daily, Toprol-XL 12.5 mg daily, as needed nitroglycerin.  S/P TAVR (transcatheter aortic valve replacement) TAVR functioning normally on most recent echocardiogram May 2023.  Continue SBE prophylaxis.          Dispo:  Return in about 6 weeks (around 01/16/2022) for Routine Follow Up, w/ Dr. Burt Knack, or [name], PA-C.   Medication Adjustments/Labs and Tests Ordered: Current medicines are reviewed at length with the patient today.  Concerns regarding medicines are outlined above.  Tests Ordered: Orders Placed This Encounter  Procedures   Basic metabolic panel   Basic metabolic panel   Protime-INR   Medication Changes: Meds ordered this encounter  Medications   furosemide (LASIX) 40 MG tablet    Sig: Take 1.5 tablets (60 mg total) by mouth daily.    Dispense:  135 tablet    Refill:  3   Signed, [name], PA-C  12/05/2021 7:02 PM    [address], [place], [place]  [address] Phone: [phone]; Fax: [phone]

## 2021-12-05 ENCOUNTER — Ambulatory Visit: Payer: Medicare Other | Attending: Physician Assistant | Admitting: Physician Assistant

## 2021-12-05 ENCOUNTER — Telehealth: Payer: Self-pay

## 2021-12-05 ENCOUNTER — Encounter: Payer: Self-pay | Admitting: Physician Assistant

## 2021-12-05 ENCOUNTER — Telehealth: Payer: Self-pay | Admitting: Physician Assistant

## 2021-12-05 ENCOUNTER — Ambulatory Visit: Payer: Medicare Other

## 2021-12-05 VITALS — BP 116/50 | HR 68 | Ht 68.0 in | Wt 176.0 lb

## 2021-12-05 DIAGNOSIS — I4821 Permanent atrial fibrillation: Secondary | ICD-10-CM | POA: Insufficient documentation

## 2021-12-05 DIAGNOSIS — I25119 Atherosclerotic heart disease of native coronary artery with unspecified angina pectoris: Secondary | ICD-10-CM | POA: Insufficient documentation

## 2021-12-05 DIAGNOSIS — I5020 Unspecified systolic (congestive) heart failure: Secondary | ICD-10-CM | POA: Insufficient documentation

## 2021-12-05 DIAGNOSIS — I482 Chronic atrial fibrillation, unspecified: Secondary | ICD-10-CM

## 2021-12-05 DIAGNOSIS — Z952 Presence of prosthetic heart valve: Secondary | ICD-10-CM | POA: Insufficient documentation

## 2021-12-05 LAB — PROTIME-INR
INR: 1.5 — ABNORMAL HIGH (ref 0.9–1.2)
Prothrombin Time: 15 s — ABNORMAL HIGH (ref 9.1–12.0)

## 2021-12-05 LAB — BASIC METABOLIC PANEL
BUN/Creatinine Ratio: 24 (ref 10–24)
BUN: 28 mg/dL (ref 10–36)
CO2: 26 mmol/L (ref 20–29)
Calcium: 9.2 mg/dL (ref 8.6–10.2)
Chloride: 102 mmol/L (ref 96–106)
Creatinine, Ser: 1.16 mg/dL (ref 0.76–1.27)
Glucose: 95 mg/dL (ref 70–99)
Potassium: 4.1 mmol/L (ref 3.5–5.2)
Sodium: 137 mmol/L (ref 134–144)
eGFR: 59 mL/min/{1.73_m2} — ABNORMAL LOW (ref >59)

## 2021-12-05 MED ORDER — FUROSEMIDE 40 MG PO TABS: 60.0000 mg | ORAL_TABLET | Freq: Every day | ORAL | 3 refills | Status: AC

## 2021-12-05 NOTE — Assessment & Plan Note (Signed)
History of multiple PCI procedures in the past.  He has known high-grade disease in the RCA and prior PCI attempt was unsuccessful.  He is currently doing well without anginal symptoms.  Continue Lipitor 20 mg daily, Imdur 60 mg daily, Toprol-XL 12.5 mg daily, as needed nitroglycerin.

## 2021-12-05 NOTE — Patient Instructions (Addendum)
Medication Instructions:  Your physician has recommended you make the following change in your medication:   INCREASE the Lasix to 40 mg taking 1 1/2 tablet daily   *If you need a refill on your cardiac medications before your next appointment, please call your pharmacy*   Lab Work: TODAY:  [place], 12/12/21 FOR:  BMET (anytime after 7:15 a.m)  If you have labs (blood work) drawn today and your tests are completely normal, you will receive your results only by: [place] (if you have MyChart) OR A paper copy in the mail If you have any lab test that is abnormal or we need to change your treatment, we will call you to review the results.   Testing/Procedures: None ordered   Follow-Up: At [facility], you and your health needs are our priority.  As part of our continuing mission to provide you with exceptional heart care, we have created designated Provider Care Teams.  These Care Teams include your primary Cardiologist (physician) and Advanced Practice Providers (APPs -  Physician Assistants and Nurse Practitioners) who all work together to provide you with the care you need, when you need it.  We recommend signing up for the patient portal called "MyChart".  Sign up information is provided on this After Visit Summary.  MyChart is used to connect with patients for Virtual Visits (Telemedicine).  Patients are able to view lab/test results, encounter notes, upcoming appointments, etc.  Non-urgent messages can be sent to your provider as well.   To learn more about what you can do with MyChart, go to [email or web address].    Your next appointment:   01/04/22    The format for your next appointment:   In Person  Provider:   [name], PA-C         Other Instructions   Important Information About Sugar

## 2021-12-05 NOTE — Telephone Encounter (Signed)
Patient called to get test results and to ask about coumadin. Please call back

## 2021-12-05 NOTE — Assessment & Plan Note (Signed)
Ejection fraction 45-50 by echocardiogram in May 2023.  He was recently admitted with volume overload.  He was discharged on furosemide 40 mg daily.  He has been given extra furosemide twice since discharge.  His weight went up to about 5 pounds since discharge.  Previously, he was on furosemide 80 mg daily.  Medicines were reduced earlier this year because of hypotension in the setting of probable community-acquired pneumonia.  Increase furosemide to 60 mg daily.  Obtain BMET today and repeat a BMET in 1 week.  Continue Imdur 60 mg daily, Toprol-XL 12.5 mg daily, Entresto 24/26 mg twice daily, spironolactone 25 mg daily.  Follow-up in 4 to 6 weeks.

## 2021-12-05 NOTE — Assessment & Plan Note (Signed)
TAVR functioning normally on most recent echocardiogram May 2023.  Continue SBE prophylaxis.

## 2021-12-05 NOTE — Assessment & Plan Note (Signed)
Continue Coumadin as directed by Coumadin clinic.  PT/INR was obtained while he was here today.

## 2021-12-05 NOTE — Telephone Encounter (Signed)
Returned pt's call to make him aware of PT/INR results. No answer, Left message with callback number [phone]).

## 2021-12-05 NOTE — Telephone Encounter (Signed)
Pt's INR managed by PCP, Dr Reynaldo Minium. Called pt and spoke with his daughter,  Butts who confirmed pt does currently go to PCP to have Warfarin/INR managed.   Called PCP office to confirm and make them aware PT/INR was drawn today at office visit. Left detailed message and callback number.

## 2021-12-05 NOTE — Telephone Encounter (Signed)
Received called from [place], [place], at PCP who confirmed they do currently manage Warfarin/INR and also confirmed their office would address today's INR (1.5).

## 2021-12-06 NOTE — Telephone Encounter (Signed)
Received a call from [place], [place] with [facility] stating that the pt cannot be managed by them per their NP [place] and that Dr. Reynaldo Minium is only there 2 days of the week so it would be unlikely for him to manage. Evelena Peat states that the pt was being monitored there when he had home health services and the plan was for him to get into a specialty clinic for monitoring. She stated they last checked his INR on 11/30/2021 and INR was 4.9 and they did give him instructions to hold warfarin doses 2 days then start taking 1.'25mg'$  daily except 2.'5mg'$  on Mo, We, & Fri. Evelena Peat stated per the conservation on 12/05/2021 she did state they would manage the INR but was told today they could not so we would need to take over. Advised I would gather more information and update her regarding resuming management. Will need to call the family to ensure transportation to our facility.   At 1243pm, received a voicemail from [place] stating that they had decided to continue managing the pt's INR levels after speaking with people in the office so no need to assume INR care on the pt and to call back if needed. Therefore, will not call the pt'f family regarding an appt as [place] will continue care for the pt.

## 2021-12-07 ENCOUNTER — Telehealth: Payer: Self-pay | Admitting: Cardiovascular Disease

## 2021-12-07 DIAGNOSIS — I2510 Atherosclerotic heart disease of native coronary artery without angina pectoris: Secondary | ICD-10-CM | POA: Diagnosis not present

## 2021-12-07 DIAGNOSIS — I872 Venous insufficiency (chronic) (peripheral): Secondary | ICD-10-CM | POA: Diagnosis not present

## 2021-12-07 DIAGNOSIS — I5042 Chronic combined systolic (congestive) and diastolic (congestive) heart failure: Secondary | ICD-10-CM | POA: Diagnosis not present

## 2021-12-07 DIAGNOSIS — I130 Hypertensive heart and chronic kidney disease with heart failure and stage 1 through stage 4 chronic kidney disease, or unspecified chronic kidney disease: Secondary | ICD-10-CM | POA: Diagnosis not present

## 2021-12-07 DIAGNOSIS — L97821 Non-pressure chronic ulcer of other part of left lower leg limited to breakdown of skin: Secondary | ICD-10-CM | POA: Diagnosis not present

## 2021-12-07 DIAGNOSIS — N1831 Chronic kidney disease, stage 3a: Secondary | ICD-10-CM | POA: Diagnosis not present

## 2021-12-07 NOTE — Telephone Encounter (Signed)
Called to let Tyler Williams at [facility] know that the results have been faxed over. Also, advised that we did not give any warfarin dose instructions as the pt is being managed PCP and Evelena Peat MA with [facility] was aware of the INR result on 12/05/21. She stated she would call PCP since they have not given any instructions. She was thankful for the call back.

## 2021-12-07 NOTE — Telephone Encounter (Signed)
Calling in to get patient coumadin results. Fax [phone]. Please advise

## 2021-12-12 ENCOUNTER — Ambulatory Visit: Payer: Medicare Other | Attending: Cardiovascular Disease

## 2021-12-12 DIAGNOSIS — I4821 Permanent atrial fibrillation: Secondary | ICD-10-CM | POA: Diagnosis not present

## 2021-12-12 DIAGNOSIS — L97821 Non-pressure chronic ulcer of other part of left lower leg limited to breakdown of skin: Secondary | ICD-10-CM | POA: Diagnosis not present

## 2021-12-12 DIAGNOSIS — N1831 Chronic kidney disease, stage 3a: Secondary | ICD-10-CM | POA: Diagnosis not present

## 2021-12-12 DIAGNOSIS — I5020 Unspecified systolic (congestive) heart failure: Secondary | ICD-10-CM | POA: Diagnosis not present

## 2021-12-12 DIAGNOSIS — I25119 Atherosclerotic heart disease of native coronary artery with unspecified angina pectoris: Secondary | ICD-10-CM

## 2021-12-12 DIAGNOSIS — I2510 Atherosclerotic heart disease of native coronary artery without angina pectoris: Secondary | ICD-10-CM | POA: Diagnosis not present

## 2021-12-12 DIAGNOSIS — I5042 Chronic combined systolic (congestive) and diastolic (congestive) heart failure: Secondary | ICD-10-CM | POA: Diagnosis not present

## 2021-12-12 DIAGNOSIS — I872 Venous insufficiency (chronic) (peripheral): Secondary | ICD-10-CM | POA: Diagnosis not present

## 2021-12-12 DIAGNOSIS — I130 Hypertensive heart and chronic kidney disease with heart failure and stage 1 through stage 4 chronic kidney disease, or unspecified chronic kidney disease: Secondary | ICD-10-CM | POA: Diagnosis not present

## 2021-12-13 ENCOUNTER — Telehealth: Payer: Self-pay | Admitting: *Deleted

## 2021-12-13 ENCOUNTER — Other Ambulatory Visit: Payer: Medicare Other

## 2021-12-13 DIAGNOSIS — I872 Venous insufficiency (chronic) (peripheral): Secondary | ICD-10-CM | POA: Diagnosis not present

## 2021-12-13 DIAGNOSIS — L97821 Non-pressure chronic ulcer of other part of left lower leg limited to breakdown of skin: Secondary | ICD-10-CM | POA: Diagnosis not present

## 2021-12-13 DIAGNOSIS — Z79899 Other long term (current) drug therapy: Secondary | ICD-10-CM

## 2021-12-13 DIAGNOSIS — I130 Hypertensive heart and chronic kidney disease with heart failure and stage 1 through stage 4 chronic kidney disease, or unspecified chronic kidney disease: Secondary | ICD-10-CM | POA: Diagnosis not present

## 2021-12-13 DIAGNOSIS — I2510 Atherosclerotic heart disease of native coronary artery without angina pectoris: Secondary | ICD-10-CM | POA: Diagnosis not present

## 2021-12-13 DIAGNOSIS — I5042 Chronic combined systolic (congestive) and diastolic (congestive) heart failure: Secondary | ICD-10-CM | POA: Diagnosis not present

## 2021-12-13 DIAGNOSIS — N1831 Chronic kidney disease, stage 3a: Secondary | ICD-10-CM | POA: Diagnosis not present

## 2021-12-13 LAB — BASIC METABOLIC PANEL
BUN/Creatinine Ratio: 22 (ref 10–24)
BUN: 28 mg/dL (ref 10–36)
CO2: 25 mmol/L (ref 20–29)
Calcium: 9.3 mg/dL (ref 8.6–10.2)
Chloride: 103 mmol/L (ref 96–106)
Creatinine, Ser: 1.28 mg/dL — ABNORMAL HIGH (ref 0.76–1.27)
Glucose: 94 mg/dL (ref 70–99)
Potassium: 4.7 mmol/L (ref 3.5–5.2)
Sodium: 141 mmol/L (ref 134–144)
eGFR: 52 mL/min/{1.73_m2} — ABNORMAL LOW (ref >59)

## 2021-12-13 NOTE — Telephone Encounter (Signed)
-----   Message from Liliane Shi, [place] sent at 12/13/2021  8:19 AM EDT ----- Creatinine increased some but overall stable. K+ normal.  PLAN:  -Continue current medications. -Repeat BMET Friday Scott Weaver, [place]    12/13/2021 8:18 AM

## 2021-12-15 DIAGNOSIS — I5042 Chronic combined systolic (congestive) and diastolic (congestive) heart failure: Secondary | ICD-10-CM | POA: Diagnosis not present

## 2021-12-15 DIAGNOSIS — I2510 Atherosclerotic heart disease of native coronary artery without angina pectoris: Secondary | ICD-10-CM | POA: Diagnosis not present

## 2021-12-15 DIAGNOSIS — I872 Venous insufficiency (chronic) (peripheral): Secondary | ICD-10-CM | POA: Diagnosis not present

## 2021-12-15 DIAGNOSIS — N1831 Chronic kidney disease, stage 3a: Secondary | ICD-10-CM | POA: Diagnosis not present

## 2021-12-15 DIAGNOSIS — I130 Hypertensive heart and chronic kidney disease with heart failure and stage 1 through stage 4 chronic kidney disease, or unspecified chronic kidney disease: Secondary | ICD-10-CM | POA: Diagnosis not present

## 2021-12-15 DIAGNOSIS — L97821 Non-pressure chronic ulcer of other part of left lower leg limited to breakdown of skin: Secondary | ICD-10-CM | POA: Diagnosis not present

## 2021-12-16 ENCOUNTER — Other Ambulatory Visit: Payer: Medicare Other

## 2021-12-21 DIAGNOSIS — I130 Hypertensive heart and chronic kidney disease with heart failure and stage 1 through stage 4 chronic kidney disease, or unspecified chronic kidney disease: Secondary | ICD-10-CM | POA: Diagnosis not present

## 2021-12-21 DIAGNOSIS — R3914 Feeling of incomplete bladder emptying: Secondary | ICD-10-CM | POA: Diagnosis not present

## 2021-12-21 DIAGNOSIS — I2510 Atherosclerotic heart disease of native coronary artery without angina pectoris: Secondary | ICD-10-CM | POA: Diagnosis not present

## 2021-12-21 DIAGNOSIS — N1831 Chronic kidney disease, stage 3a: Secondary | ICD-10-CM | POA: Diagnosis not present

## 2021-12-21 DIAGNOSIS — I5042 Chronic combined systolic (congestive) and diastolic (congestive) heart failure: Secondary | ICD-10-CM | POA: Diagnosis not present

## 2021-12-21 DIAGNOSIS — L97821 Non-pressure chronic ulcer of other part of left lower leg limited to breakdown of skin: Secondary | ICD-10-CM | POA: Diagnosis not present

## 2021-12-21 DIAGNOSIS — I872 Venous insufficiency (chronic) (peripheral): Secondary | ICD-10-CM | POA: Diagnosis not present

## 2021-12-21 DIAGNOSIS — N401 Enlarged prostate with lower urinary tract symptoms: Secondary | ICD-10-CM | POA: Diagnosis not present

## 2021-12-22 ENCOUNTER — Observation Stay (HOSPITAL_COMMUNITY): Payer: Medicare Other

## 2021-12-22 ENCOUNTER — Encounter (HOSPITAL_COMMUNITY): Payer: Self-pay | Admitting: Emergency Medicine

## 2021-12-22 ENCOUNTER — Other Ambulatory Visit: Payer: Self-pay

## 2021-12-22 ENCOUNTER — Emergency Department (HOSPITAL_COMMUNITY): Payer: Medicare Other

## 2021-12-22 ENCOUNTER — Inpatient Hospital Stay (HOSPITAL_COMMUNITY)
Admission: EM | Admit: 2021-12-22 | Discharge: 2021-12-26 | DRG: 291 | Disposition: A | Discharge status: Nursing Home (Includes ICF) | Payer: Medicare Other | Source: Skilled Nursing Facility | Attending: Internal Medicine | Admitting: Internal Medicine

## 2021-12-22 DIAGNOSIS — I5023 Acute on chronic systolic (congestive) heart failure: Secondary | ICD-10-CM

## 2021-12-22 DIAGNOSIS — R6889 Other general symptoms and signs: Secondary | ICD-10-CM | POA: Diagnosis not present

## 2021-12-22 DIAGNOSIS — Z8249 Family history of ischemic heart disease and other diseases of the circulatory system: Secondary | ICD-10-CM

## 2021-12-22 DIAGNOSIS — N400 Enlarged prostate without lower urinary tract symptoms: Secondary | ICD-10-CM | POA: Diagnosis present

## 2021-12-22 DIAGNOSIS — J811 Chronic pulmonary edema: Secondary | ICD-10-CM | POA: Diagnosis not present

## 2021-12-22 DIAGNOSIS — R062 Wheezing: Secondary | ICD-10-CM | POA: Diagnosis not present

## 2021-12-22 DIAGNOSIS — Z85828 Personal history of other malignant neoplasm of skin: Secondary | ICD-10-CM

## 2021-12-22 DIAGNOSIS — E7800 Pure hypercholesterolemia, unspecified: Secondary | ICD-10-CM | POA: Diagnosis present

## 2021-12-22 DIAGNOSIS — N390 Urinary tract infection, site not specified: Secondary | ICD-10-CM | POA: Insufficient documentation

## 2021-12-22 DIAGNOSIS — N3000 Acute cystitis without hematuria: Secondary | ICD-10-CM | POA: Diagnosis present

## 2021-12-22 DIAGNOSIS — R404 Transient alteration of awareness: Secondary | ICD-10-CM | POA: Diagnosis not present

## 2021-12-22 DIAGNOSIS — I5021 Acute systolic (congestive) heart failure: Secondary | ICD-10-CM | POA: Diagnosis not present

## 2021-12-22 DIAGNOSIS — I130 Hypertensive heart and chronic kidney disease with heart failure and stage 1 through stage 4 chronic kidney disease, or unspecified chronic kidney disease: Secondary | ICD-10-CM | POA: Diagnosis present

## 2021-12-22 DIAGNOSIS — R531 Weakness: Secondary | ICD-10-CM | POA: Diagnosis not present

## 2021-12-22 DIAGNOSIS — I700 Atherosclerosis of aorta: Secondary | ICD-10-CM | POA: Diagnosis present

## 2021-12-22 DIAGNOSIS — I739 Peripheral vascular disease, unspecified: Secondary | ICD-10-CM | POA: Diagnosis present

## 2021-12-22 DIAGNOSIS — J90 Pleural effusion, not elsewhere classified: Secondary | ICD-10-CM | POA: Diagnosis not present

## 2021-12-22 DIAGNOSIS — K219 Gastro-esophageal reflux disease without esophagitis: Secondary | ICD-10-CM | POA: Diagnosis present

## 2021-12-22 DIAGNOSIS — Z96653 Presence of artificial knee joint, bilateral: Secondary | ICD-10-CM | POA: Diagnosis present

## 2021-12-22 DIAGNOSIS — F419 Anxiety disorder, unspecified: Secondary | ICD-10-CM | POA: Diagnosis present

## 2021-12-22 DIAGNOSIS — I509 Heart failure, unspecified: Principal | ICD-10-CM

## 2021-12-22 DIAGNOSIS — I110 Hypertensive heart disease with heart failure: Secondary | ICD-10-CM | POA: Diagnosis not present

## 2021-12-22 DIAGNOSIS — I2510 Atherosclerotic heart disease of native coronary artery without angina pectoris: Secondary | ICD-10-CM | POA: Diagnosis present

## 2021-12-22 DIAGNOSIS — Z7401 Bed confinement status: Secondary | ICD-10-CM | POA: Diagnosis not present

## 2021-12-22 DIAGNOSIS — Z86711 Personal history of pulmonary embolism: Secondary | ICD-10-CM

## 2021-12-22 DIAGNOSIS — I495 Sick sinus syndrome: Secondary | ICD-10-CM | POA: Diagnosis present

## 2021-12-22 DIAGNOSIS — B9620 Unspecified Escherichia coli [E. coli] as the cause of diseases classified elsewhere: Secondary | ICD-10-CM | POA: Diagnosis present

## 2021-12-22 DIAGNOSIS — E785 Hyperlipidemia, unspecified: Secondary | ICD-10-CM | POA: Diagnosis present

## 2021-12-22 DIAGNOSIS — Z9049 Acquired absence of other specified parts of digestive tract: Secondary | ICD-10-CM

## 2021-12-22 DIAGNOSIS — I272 Pulmonary hypertension, unspecified: Secondary | ICD-10-CM | POA: Diagnosis present

## 2021-12-22 DIAGNOSIS — N1831 Chronic kidney disease, stage 3a: Secondary | ICD-10-CM | POA: Diagnosis present

## 2021-12-22 DIAGNOSIS — Z1152 Encounter for screening for COVID-19: Secondary | ICD-10-CM

## 2021-12-22 DIAGNOSIS — Z7901 Long term (current) use of anticoagulants: Secondary | ICD-10-CM | POA: Diagnosis not present

## 2021-12-22 DIAGNOSIS — Z1623 Resistance to quinolones and fluoroquinolones: Secondary | ICD-10-CM | POA: Diagnosis present

## 2021-12-22 DIAGNOSIS — Z953 Presence of xenogenic heart valve: Secondary | ICD-10-CM | POA: Diagnosis not present

## 2021-12-22 DIAGNOSIS — Z79899 Other long term (current) drug therapy: Secondary | ICD-10-CM | POA: Diagnosis not present

## 2021-12-22 DIAGNOSIS — I4821 Permanent atrial fibrillation: Secondary | ICD-10-CM | POA: Diagnosis present

## 2021-12-22 DIAGNOSIS — R0689 Other abnormalities of breathing: Secondary | ICD-10-CM | POA: Diagnosis not present

## 2021-12-22 DIAGNOSIS — Z743 Need for continuous supervision: Secondary | ICD-10-CM | POA: Diagnosis not present

## 2021-12-22 DIAGNOSIS — J189 Pneumonia, unspecified organism: Secondary | ICD-10-CM

## 2021-12-22 DIAGNOSIS — M7989 Other specified soft tissue disorders: Secondary | ICD-10-CM | POA: Diagnosis present

## 2021-12-22 DIAGNOSIS — K7460 Unspecified cirrhosis of liver: Secondary | ICD-10-CM | POA: Diagnosis present

## 2021-12-22 DIAGNOSIS — Z8679 Personal history of other diseases of the circulatory system: Secondary | ICD-10-CM

## 2021-12-22 DIAGNOSIS — I10 Essential (primary) hypertension: Secondary | ICD-10-CM | POA: Diagnosis present

## 2021-12-22 HISTORY — DX: Acute on chronic systolic (congestive) heart failure: I50.23

## 2021-12-22 LAB — COMPREHENSIVE METABOLIC PANEL
ALT: 13 U/L (ref 0–44)
AST: 19 U/L (ref 15–41)
Albumin: 3.2 g/dL — ABNORMAL LOW (ref 3.5–5.0)
Alkaline Phosphatase: 108 U/L (ref 38–126)
Anion gap: 8 (ref 5–15)
BUN: 47 mg/dL — ABNORMAL HIGH (ref 8–23)
CO2: 24 mmol/L (ref 22–32)
Calcium: 8.9 mg/dL (ref 8.9–10.3)
Chloride: 102 mmol/L (ref 98–111)
Creatinine, Ser: 1.28 mg/dL — ABNORMAL HIGH (ref 0.61–1.24)
GFR, Estimated: 52 mL/min — ABNORMAL LOW (ref >60)
Glucose, Bld: 94 mg/dL (ref 70–99)
Potassium: 4.1 mmol/L (ref 3.5–5.1)
Sodium: 134 mmol/L — ABNORMAL LOW (ref 135–145)
Total Bilirubin: 1.6 mg/dL — ABNORMAL HIGH (ref 0.3–1.2)
Total Protein: 7.2 g/dL (ref 6.5–8.1)

## 2021-12-22 LAB — CBC WITH DIFFERENTIAL/PLATELET
Abs Immature Granulocytes: 0.02 10*3/uL (ref 0.00–0.07)
Basophils Absolute: 0 10*3/uL (ref 0.0–0.1)
Basophils Relative: 0 %
Eosinophils Absolute: 0 10*3/uL (ref 0.0–0.5)
Eosinophils Relative: 0 %
HCT: 34.4 % — ABNORMAL LOW (ref 39.0–52.0)
Hemoglobin: 11.2 g/dL — ABNORMAL LOW (ref 13.0–17.0)
Immature Granulocytes: 0 %
Lymphocytes Relative: 2 %
Lymphs Abs: 0.1 10*3/uL — ABNORMAL LOW (ref 0.7–4.0)
MCH: 29.4 pg (ref 26.0–34.0)
MCHC: 32.6 g/dL (ref 30.0–36.0)
MCV: 90.3 fL (ref 80.0–100.0)
Monocytes Absolute: 0.1 10*3/uL (ref 0.1–1.0)
Monocytes Relative: 2 %
Neutro Abs: 5.5 10*3/uL (ref 1.7–7.7)
Neutrophils Relative %: 96 %
Platelets: 98 10*3/uL — ABNORMAL LOW (ref 150–400)
RBC: 3.81 MIL/uL — ABNORMAL LOW (ref 4.22–5.81)
RDW: 17.3 % — ABNORMAL HIGH (ref 11.5–15.5)
WBC: 5.8 10*3/uL (ref 4.0–10.5)
nRBC: 0 % (ref 0.0–0.2)

## 2021-12-22 LAB — URINALYSIS, ROUTINE W REFLEX MICROSCOPIC
Bilirubin Urine: NEGATIVE
Glucose, UA: NEGATIVE mg/dL
Ketones, ur: NEGATIVE mg/dL
Nitrite: POSITIVE — AB
Protein, ur: NEGATIVE mg/dL
Specific Gravity, Urine: 1.011 (ref 1.005–1.030)
WBC, UA: >50 WBC/hpf — ABNORMAL HIGH (ref 0–5)
pH: 5 (ref 5.0–8.0)

## 2021-12-22 LAB — PROCALCITONIN: Procalcitonin: 1.79 ng/mL

## 2021-12-22 LAB — BRAIN NATRIURETIC PEPTIDE: B Natriuretic Peptide: 1164.9 pg/mL — ABNORMAL HIGH (ref 0.0–100.0)

## 2021-12-22 LAB — LACTIC ACID, PLASMA: Lactic Acid, Venous: 1.8 mmol/L (ref 0.5–1.9)

## 2021-12-22 LAB — SARS CORONAVIRUS 2 BY RT PCR: SARS Coronavirus 2 by RT PCR: NEGATIVE

## 2021-12-22 LAB — PROTIME-INR
INR: 1.5 — ABNORMAL HIGH (ref 0.8–1.2)
Prothrombin Time: 18.4 seconds — ABNORMAL HIGH (ref 11.4–15.2)

## 2021-12-22 LAB — APTT: aPTT: 31 seconds (ref 24–36)

## 2021-12-22 MED ORDER — ONDANSETRON HCL 4 MG/2ML IJ SOLN: 4.0000 mg | Freq: Four times a day (QID) | INTRAMUSCULAR | Status: DC | PRN

## 2021-12-22 MED ORDER — SODIUM CHLORIDE 0.9 % IV SOLN
1.0000 g | Freq: Once | INTRAVENOUS | Status: AC
  Administered 2021-12-22: 1 g via INTRAVENOUS
  Filled 2021-12-22: qty 10

## 2021-12-22 MED ORDER — LORAZEPAM 1 MG PO TABS
1.0000 mg | ORAL_TABLET | Freq: Two times a day (BID) | ORAL | Status: DC | PRN
  Administered 2021-12-23 – 2021-12-25 (×2): 1 mg via ORAL
  Filled 2021-12-22 (×2): qty 1

## 2021-12-22 MED ORDER — ENOXAPARIN SODIUM 40 MG/0.4ML IJ SOSY
40.0000 mg | PREFILLED_SYRINGE | Freq: Every day | INTRAMUSCULAR | Status: DC
  Administered 2021-12-22 – 2021-12-23 (×2): 40 mg via SUBCUTANEOUS
  Filled 2021-12-22 (×2): qty 0.4

## 2021-12-22 MED ORDER — ACETAMINOPHEN 650 MG RE SUPP: 650.0000 mg | Freq: Four times a day (QID) | RECTAL | Status: DC | PRN

## 2021-12-22 MED ORDER — FUROSEMIDE 10 MG/ML IJ SOLN
40.0000 mg | Freq: Every day | INTRAMUSCULAR | Status: DC
  Administered 2021-12-23 – 2021-12-26 (×4): 40 mg via INTRAVENOUS
  Filled 2021-12-22 (×4): qty 4

## 2021-12-22 MED ORDER — SODIUM CHLORIDE 0.9 % IV SOLN: 1.0000 g | INTRAVENOUS | Status: DC

## 2021-12-22 MED ORDER — FUROSEMIDE 10 MG/ML IJ SOLN
40.0000 mg | Freq: Once | INTRAMUSCULAR | Status: AC
  Administered 2021-12-22: 40 mg via INTRAVENOUS
  Filled 2021-12-22: qty 4

## 2021-12-22 MED ORDER — SODIUM CHLORIDE 0.9 % IV SOLN
500.0000 mg | Freq: Once | INTRAVENOUS | Status: AC
  Administered 2021-12-22: 500 mg via INTRAVENOUS
  Filled 2021-12-22: qty 5

## 2021-12-22 MED ORDER — WARFARIN SODIUM 4 MG PO TABS
4.0000 mg | ORAL_TABLET | Freq: Once | ORAL | Status: AC
  Administered 2021-12-22: 4 mg via ORAL
  Filled 2021-12-22: qty 1

## 2021-12-22 MED ORDER — ATORVASTATIN CALCIUM 20 MG PO TABS
20.0000 mg | ORAL_TABLET | Freq: Every day | ORAL | Status: DC
  Administered 2021-12-23 – 2021-12-26 (×4): 20 mg via ORAL
  Filled 2021-12-22 (×3): qty 1
  Filled 2021-12-22: qty 2

## 2021-12-22 MED ORDER — ACETAMINOPHEN 325 MG PO TABS: 650.0000 mg | ORAL_TABLET | Freq: Four times a day (QID) | ORAL | Status: DC | PRN

## 2021-12-22 MED ORDER — WARFARIN - PHARMACIST DOSING INPATIENT: Freq: Every day | Status: DC

## 2021-12-22 MED ORDER — ONDANSETRON HCL 4 MG PO TABS: 4.0000 mg | ORAL_TABLET | Freq: Four times a day (QID) | ORAL | Status: DC | PRN

## 2021-12-22 NOTE — Progress Notes (Addendum)
ANTICOAGULATION CONSULT NOTE - Initial Consult  Pharmacy Consult for coumadin Indication: atrial fibrillation  No Known Allergies  Patient Measurements:   Heparin Dosing Weight:   Vital Signs: Temp: 98.8 F (37.1 C) (11/09 1607) Temp Source: Oral (11/09 1224) BP: 120/56 (11/09 1600) Pulse Rate: 77 (11/09 1607)  Labs: Recent Labs    12/22/21 1218  HGB 11.2*  HCT 34.4*  PLT 98*  APTT 31  LABPROT 18.4*  INR 1.5*  CREATININE 1.28*    CrCl cannot be calculated (Unknown ideal weight.).   Medical History: Past Medical History:  Diagnosis Date   AAA (abdominal aortic aneurysm) ([place])    s/p repair   Aortic atherosclerosis (HCC)    Arthritis    knees   CAD (coronary artery disease)    a. s/p POBA 1980s, 1990s (details unclear). b. known stenotic RCA (previous unsuccessful intervention)   Cancer ([place])    skin cancer removed, right hand, left leg, chest   Carotid artery disease (HCC)    Chronic anticoagulation    Coumadin   Chronic kidney disease, stage 3a (HCC)    Cirrhosis of liver ([place])    by imaging   Coronary artery disease    GERD (gastroesophageal reflux disease)    Heart failure with mildly reduced ejection fraction (HFmrEF) ([place])    History of pulmonary embolism    1964 related to dislocated hip on right    History of skin cancer    Hypercholesteremia    Hypertension    Incidental cecal mass noted on CT imaging 05/24/2016   **An incidental finding of potential clinical significance has been found. 4.6 x 5.6 x 3.7 cm masslike lesion in the cecum adjacent to the ileocecal valve may represent a large polyp or colonic neoplasm. 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 Pharmacy to dose while in patient. Admit INR 1.5 - below goal Hg 11.2, PLT low  @ 98   Goal of Therapy:  INR 2-3 Monitor platelets by anticoagulation protocol: Yes   Plan:  Warfarin 4 mg po x 1 dose today Discussed w/ Dr Marylyn Ishihara: add prophylactic dose LMWH until INR > = 2 Daily INR  Eudelia Bunch, Pharm.D Use secure chat for questions 12/22/2021 6:16 PM

## 2021-12-22 NOTE — ED Provider Notes (Signed)
[facility] DEPT Provider Note   CSN: [id number] Arrival date & time: 12/22/21  1211     History  Chief Complaint  Patient presents with   Shortness of Breath   Dizziness    Malikiah Debarr is a 88 y.o. male.  Patient is a 88 year old male with a history of CAD, PVD, atrial fibrillation on Coumadin, status post TAVR, heart failure, pulmonary hypertension, cirrhosis, chronic kidney disease, AAA status postrepair and prior history of PE who is presenting today with shortness of breath and chills.  Patient reports for the last 2 or 3 days he has been more tired, decreased appetite but today is when he really started feeling poorly.  He is felt short of breath today as well as had fever and chills.  He denies cough, chest pain, abdominal pain, nausea vomiting or diarrhea.  When EMS arrived patient was found to have an O2 sat on room air of 85% and he was placed on 2 L.  Also their thermometer said 100.  Patient reports taking Tylenol before EMS arrived.  Patient does note he has swelling in his legs but that is chronic.  Last week his Lasix was decreased because of worsening renal function from 80-40.  He reports that he still been making urine and denies any dysuria or frequency.  The history is provided by the patient, medical records and the EMS personnel.  Shortness of Breath Dizziness Associated symptoms: shortness of breath        Home Medications Prior to Admission medications   Medication Sig Start Date End Date Taking? Authorizing Provider  atorvastatin (LIPITOR) 20 MG tablet TAKE 1 TABLET ONCE DAILY. 12/30/20   Sherren Mocha, MD  Calcium-Magnesium-Zinc (CAL-MAG-ZINC PO) Take 1 tablet by mouth every morning.    [provider]  Cholecalciferol (VITAMIN D-3) 125 MCG (5000 UT) TABS Take 5,000 Units by mouth every morning.    [provider]  fesoterodine (TOVIAZ) 8 MG TB24 tablet Take 8 mg by mouth every evening.     [provider]  finasteride (PROSCAR) 5 MG tablet Take 1 tablet (5 mg total) by mouth daily. 06/21/20   Charlynne Cousins, MD  furosemide (LASIX) 40 MG tablet Take 1.5 tablets (60 mg total) by mouth daily. 12/05/21   [name] T, PA-C  isosorbide mononitrate (IMDUR) 60 MG 24 hr tablet Take 1 tablet (60 mg total) by mouth daily. 11/25/21   Domenic Polite, MD  LORazepam (ATIVAN) 1 MG tablet Take 1 mg by mouth 2 (two) times daily as needed for anxiety.    [provider]  metoprolol succinate (TOPROL XL) 25 MG 24 hr tablet Take 0.5 tablets (12.5 mg total) by mouth daily. 10/05/21   [name] T, PA-C  Multiple Vitamins-Minerals (PRESERVISION AREDS) TABS Take 1 tablet by mouth daily. 03/04/09   [provider]  nitroGLYCERIN (NITROSTAT) 0.4 MG SL tablet Place 0.4 mg under the tongue every 5 (five) minutes as needed for chest pain.    [provider]  nystatin cream (MYCOSTATIN) Apply 1 Application topically 2 (two) times daily. 11/28/21   [provider]  Polyethylene Glycol 3350 POWD Take 1 Capful by mouth daily.    [provider]  sacubitril-valsartan (ENTRESTO) 24-26 MG Take 1 tablet by mouth 2 (two) times daily. Please keep upcoming appointment for future refills thank you. 07/04/21   Sherren Mocha, MD  spironolactone (ALDACTONE) 25 MG tablet Take 1 tablet (25 mg total) by mouth daily. 10/05/21  [name] T, PA-C  tamsulosin (FLOMAX) 0.4 MG CAPS capsule Take 1 capsule (0.4 mg total) by mouth daily. 06/21/20   Charlynne Cousins, MD  warfarin (COUMADIN) 2.5 MG tablet Take 2.5 mg by mouth Mon, Wed, and Fri 11/30/21   [provider]  warfarin (COUMADIN) 2.5 MG tablet Take 1/2 tab by mouth on Tue,Thurs, Sat, and Sun    [provider]      Allergies    Patient has no known allergies.    Review of Systems   Review of Systems  Respiratory:  Positive for shortness of breath.   Neurological:  Positive for dizziness.     Physical Exam Updated Vital Signs BP (!) 110/48   Pulse 73   Temp 99.2 F (37.3 C) (Oral)   Resp (!) 25   SpO2 92%  Physical Exam Vitals and nursing note reviewed.  Constitutional:      General: He is not in acute distress.    Appearance: He is well-developed.  HENT:     Head: Normocephalic and atraumatic.     Mouth/Throat:     Mouth: Mucous membranes are dry.  Eyes:     Conjunctiva/sclera: Conjunctivae normal.     Pupils: Pupils are equal, round, and reactive to light.  Cardiovascular:     Rate and Rhythm: Normal rate and regular rhythm.     Heart sounds: No murmur heard. Pulmonary:     Effort: Pulmonary effort is normal. Tachypnea present. No respiratory distress.     Breath sounds: Examination of the right-lower field reveals decreased breath sounds. Examination of the left-lower field reveals decreased breath sounds. Decreased breath sounds present. No wheezing or rales.  Abdominal:     General: There is no distension.     Palpations: Abdomen is soft.     Tenderness: There is no abdominal tenderness. There is no guarding or rebound.  Musculoskeletal:        General: No tenderness. Normal range of motion.     Cervical back: Normal range of motion and neck supple.     Right lower leg: Edema present.     Left lower leg: Edema present.     Comments: 2+ pitting edema in bilateral legs up to the midshin.  Patient is wearing compression socks  Skin:    General: Skin is warm and dry.     Findings: No erythema or rash.  Neurological:     Mental Status: He is alert and oriented to person, place, and time. Mental status is at baseline.  Psychiatric:        Mood and Affect: Mood normal.        Behavior: Behavior normal.     ED Results / Procedures / Treatments   Labs (all labs ordered are listed, but only abnormal results are displayed) Labs Reviewed  COMPREHENSIVE METABOLIC PANEL - Abnormal; Notable for the following components:      Result Value   Sodium 134 (*)     BUN 47 (*)    Creatinine, Ser 1.28 (*)    Albumin 3.2 (*)    Total Bilirubin 1.6 (*)    GFR, Estimated 52 (*)    All other components within normal limits  CBC WITH DIFFERENTIAL/PLATELET - Abnormal; Notable for the following components:   RBC 3.81 (*)    Hemoglobin 11.2 (*)    HCT 34.4 (*)    RDW 17.3 (*)    Platelets 98 (*)    Lymphs Abs 0.1 (*)  All other components within normal limits  PROTIME-INR - Abnormal; Notable for the following components:   Prothrombin Time 18.4 (*)    INR 1.5 (*)    All other components within normal limits  URINALYSIS, ROUTINE W REFLEX MICROSCOPIC - Abnormal; Notable for the following components:   APPearance HAZY (*)    Hgb urine dipstick SMALL (*)    Nitrite POSITIVE (*)    Leukocytes,Ua LARGE (*)    WBC, UA >50 (*)    Bacteria, UA RARE (*)    All other components within normal limits  BRAIN NATRIURETIC PEPTIDE - Abnormal; Notable for the following components:   B Natriuretic Peptide 1,164.9 (*)    All other components within normal limits  SARS CORONAVIRUS 2 BY RT PCR  CULTURE, BLOOD (ROUTINE X 2)  CULTURE, BLOOD (ROUTINE X 2)  URINE CULTURE  LACTIC ACID, PLASMA  APTT  LACTIC ACID, PLASMA    EKG EKG Interpretation  Date/Time:  Thursday December 22 2021 13:06:18 EST Ventricular Rate:  89 PR Interval:    QRS Duration: 149 QT Interval:  370 QTC Calculation: 451 R Axis:   116 Text Interpretation: Atrial fibrillation Ventricular premature complex Right bundle branch block No significant change since last tracing Confirmed by Blanchie Dessert ([id number]) on 12/22/2021 1:11:04 PM  Radiology DG Chest Port 1 View  Result Date: 12/22/2021 CLINICAL DATA:  Questionable sepsis - evaluate for abnormality EXAM: PORTABLE CHEST 1 VIEW COMPARISON:  11/21/2021 FINDINGS: Cardiomegaly, vascular congestion. Diffuse interstitial prominence. Bilateral perihilar and lower lobe opacities, favor edema. Small bilateral effusions. Aortic atherosclerosis. No  acute bony abnormality. IMPRESSION: Cardiomegaly with vascular congestion, interstitial prominence and perihilar/lower lobe opacities concerning for edema/CHF. Small layering effusions. Electronically Signed   By: Rolm Baptise M.D.   On: 12/22/2021 13:59    Procedures Procedures    Medications Ordered in ED Medications  cefTRIAXone (ROCEPHIN) 1 g in sodium chloride 0.9 % 100 mL IVPB (1 g Intravenous New Bag/Given 12/22/21 1532)  azithromycin (ZITHROMAX) 500 mg in sodium chloride 0.9 % 250 mL IVPB (has no administration in time range)  furosemide (LASIX) injection 40 mg (40 mg Intravenous Given 12/22/21 1532)    ED Course/ Medical Decision Making/ A&P                           Medical Decision Making Amount and/or Complexity of Data Reviewed External Data Reviewed: notes.    Details: Recent hospitalization Labs: ordered. Decision-making details documented in ED Course. Radiology: ordered and independent interpretation performed. Decision-making details documented in ED Course. ECG/medicine tests: ordered and independent interpretation performed. Decision-making details documented in ED Course.  Risk Prescription drug management.   Pt with multiple medical problems and comorbidities and presenting today with a complaint that caries a high risk for morbidity and mortality.  Here today with respiratory complaints and fever.  Concern for sepsis, pneumonia or possible viral etiology.  Also concern for possible CHF exacerbation, acute on chronic kidney disease.  Last echo was on 07/06/2021 and at that time EF of 45 to 50%.  Patient has recently had adjustment of his Lasix due to renal function but does report that he is still urinating.  Patient was noted to have a temperature today but did take Tylenol prior to arrival.  On room air here patient's oxygen saturation was 92%.  He is currently hemodynamically stable.  Undifferentiated sepsis was initiated.  3:40 PM I independently interpreted  patient's labs and EKG.  EKG  with no significant changes.  CBC without acute findings except for low platelets which is a little worse today than prior, UA with large leukocytes, nitrites and greater than 50 white cells but no bacteria, COVID is negative, CMP with stable creatinine of 1.28, normal LFTs, lactic acid is within normal limits at 1.8, BNP elevated today 1164. I have independently visualized and interpreted pt's images today.  Chest x-ray showed cardiomegaly with some vascular congestion.  Based on patient's symptoms he did drop his sats and is now requiring 2 L nasal cannula.  Concern for pneumonia and fluid overload.  Patient given Rocephin and azithromycin however have held fluids because he has a normal lactate and has fluid overload.  He was also given Lasix.  Feel that patient requires admission today.  Dr. Marylyn Ishihara with the hospitalist service was consulted.        Final Clinical Impression(s) / ED Diagnoses Final diagnoses:  Acute on chronic congestive heart failure, unspecified heart failure type ([place])  Acute cystitis without hematuria  Community acquired pneumonia, unspecified laterality    Rx / DC Orders ED Discharge Orders     None         Blanchie Dessert, MD 12/22/21 1540

## 2021-12-22 NOTE — ED Notes (Signed)
Delay in lab draw.

## 2021-12-22 NOTE — H&P (Signed)
History and Physical    Patient: Tyler Williams [id number]:[id number] DOB: 1926/04/28 DOA: 12/22/2021 DOS: the patient was seen and examined on 12/22/2021 PCP: Burnard Bunting, MD  Patient coming from: SNF  Chief Complaint:  Chief Complaint  Patient presents with   Shortness of Breath      HPI: Tyler Williams is a 88 y.o. male with medical history significant of HLD, HTN, a fib, HFrEF, CAD, AAA s/p repair, aortic stenosis s/p TAVR, GERD. Presenting with shortness of breath. He was in his normal state of health until about 3 days ago. He noticed then that he was more fatigued and short of breath. It was harder for him to get around to do activities. He didn't have any cough. He didn't have any chest pain or leg swelling. He didn't have any palpitations. He noticed some wheeze last night, but he seemed to feel better if he propped his head up with pillows. This morning he noticed that he was having chills and it was even more difficult to breathe. He became concerned and called for EMS. He denies any other aggravating or alleviating factors.     Review of Systems: As mentioned in the history of present illness. All other systems reviewed and are negative. Past Medical History:  Diagnosis Date   AAA (abdominal aortic aneurysm) (HCC)    s/p repair   Aortic atherosclerosis (HCC)    Arthritis    knees   CAD (coronary artery disease)    a. s/p POBA 1980s, 1990s (details unclear). b. known stenotic RCA (previous unsuccessful intervention)   Cancer ([place])    skin cancer removed, right hand, left leg, chest   Carotid artery disease (HCC)    Chronic anticoagulation    Coumadin   Chronic kidney disease, stage 3a (HCC)    Cirrhosis of liver ([place])    by imaging   Coronary artery disease    GERD (gastroesophageal reflux disease)    Heart failure with mildly reduced ejection fraction (HFmrEF) ([place])    History of pulmonary embolism    1964 related to dislocated hip on right    History of  skin cancer    Hypercholesteremia    Hypertension    Incidental cecal mass noted on CT imaging 05/24/2016   **An incidental finding of potential clinical significance has been found. 4.6 x 5.6 x 3.7 cm masslike lesion in the cecum adjacent to the ileocecal valve may represent a large polyp or colonic neoplasm. 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 Unable to pass wire past  RCA lesion->medically managed   CHOLECYSTECTOMY N/A 12/05/2012   Procedure: LAPAROSCOPIC CHOLECYSTECTOMY ;  Surgeon: Edward Jolly, MD;  Location: [facility];  Service: General;  Laterality: N/A;   [place] N/A 08/31/2017   Procedure: CORONARY BALLOON ANGIOPLASTY;  Surgeon: Martinique, Peter M, MD;  Location: [place] [facility];  Service: Cardiovascular;  Laterality: N/A;   EYE SURGERY     hx of cataract surgery    [place]     right inguinal hernia repair    JOINT REPLACEMENT     left knee replacement, partial right   LEFT  HEART CATH AND CORONARY ANGIOGRAPHY N/A 08/31/2017   Procedure: LEFT HEART CATH AND CORONARY ANGIOGRAPHY;  Surgeon: Martinique, Peter M, MD;  Location: [facility];  Service: Cardiovascular;  Laterality: N/A;   LEFT HEART CATHETERIZATION WITH CORONARY ANGIOGRAM N/A 05/24/2012   Procedure: LEFT HEART CATHETERIZATION WITH CORONARY ANGIOGRAM;  Surgeon: Burnell Blanks, MD;  Location: [facility] C[facility] LAB;  Service: Cardiovascular;  Laterality: N/A;   ORTHOPEDIC SURGERY     OTHER SURGICAL HISTORY     right knee popliteal aneurysm surgery stent placed    OTHER SURGICAL HISTORY     PARTIAL KNEE ARTHROPLASTY  01/22/2012   Procedure: UNICOMPARTMENTAL KNEE;  Surgeon: Mauri Pole, MD;  Location: [facility];  Service: Orthopedics;  Laterality: Right;   PERCUTANEOUS CORONARY INTERVENTION-BALLOON ONLY  05/24/2012   Procedure: PERCUTANEOUS CORONARY INTERVENTION-BALLOON ONLY;  Surgeon: Burnell Blanks, MD;  Location: [facility];  Service: Cardiovascular;;   RIGHT/LEFT HEART CATH AND CORONARY ANGIOGRAPHY N/A 05/12/2016   Procedure: Right/Left Heart Cath and Coronary Angiography;  Surgeon: Sherren Mocha, MD;  Location: [place] [facility];  Service: Cardiovascular;  Laterality: N/A;   TEE WITHOUT CARDIOVERSION N/A 06/13/2016   Procedure: TRANSESOPHAGEAL ECHOCARDIOGRAM (TEE);  Surgeon: Sherren Mocha, MD;  Location: [place];  Service: Open Heart Surgery;  Laterality: N/A;   TONSILLECTOMY     TOTAL KNEE ARTHROPLASTY Left    TRANSCATHETER AORTIC VALVE REPLACEMENT, TRANSFEMORAL N/A 06/13/2016   Procedure: TRANSCATHETER AORTIC VALVE REPLACEMENT, TRANSFEMORAL;  Surgeon: Sherren Mocha, MD;  Location: [place];  Service: Open Heart Surgery;  Laterality: N/A;   Social History:  reports that he has never smoked. He has never used smokeless tobacco. He reports that he does not currently use alcohol. He reports that he does not use drugs.  No Known Allergies  Family History  Problem Relation Age of Onset   Heart attack  Mother 37    Prior to Admission medications   Medication Sig Start Date End Date Taking? Authorizing Provider  atorvastatin (LIPITOR) 20 MG tablet TAKE 1 TABLET ONCE DAILY. 12/30/20   Sherren Mocha, MD  Calcium-Magnesium-Zinc (CAL-MAG-ZINC PO) Take 1 tablet by mouth every morning.    [provider]  Cholecalciferol (VITAMIN D-3) 125 MCG (5000 UT) TABS Take 5,000 Units by mouth every morning.    [provider]  fesoterodine (TOVIAZ) 8 MG TB24 tablet Take 8 mg by mouth every evening.    [provider]  finasteride (PROSCAR) 5 MG tablet Take 1 tablet (5 mg total) by mouth daily. 06/21/20   Charlynne Cousins, MD  furosemide (LASIX) 40 MG tablet Take 1.5 tablets (60 mg total) by mouth daily. 12/05/21   [name] T, PA-C  isosorbide mononitrate (IMDUR) 60 MG 24 hr tablet Take 1 tablet (60 mg  total) by mouth daily. 11/25/21   Domenic Polite, MD  LORazepam (ATIVAN) 1 MG tablet Take 1 mg by mouth 2 (two) times daily as needed for anxiety.    [provider]  metoprolol succinate (TOPROL XL) 25 MG 24 hr tablet Take 0.5 tablets (12.5 mg total) by mouth daily. 10/05/21   [name] T, PA-C  Multiple Vitamins-Minerals (PRESERVISION AREDS) TABS Take 1 tablet by mouth daily. 03/04/09   [provider]  nitroGLYCERIN (NITROSTAT) 0.4 MG SL tablet Place 0.4 mg under the tongue every 5 (five) minutes as needed for chest pain.    [provider]  nystatin cream (MYCOSTATIN) Apply 1 Application topically 2 (two) times daily. 11/28/21   [provider]  Polyethylene Glycol 3350 POWD Take 1 Capful by mouth daily.    [provider]  sacubitril-valsartan (ENTRESTO) 24-26 MG Take 1 tablet by mouth 2 (two) times daily. Please keep upcoming appointment for future refills thank you. 07/04/21   Sherren Mocha, MD  spironolactone (ALDACTONE) 25 MG tablet Take 1 tablet (25 mg total) by mouth daily. 10/05/21   [name] T, PA-C  tamsulosin  (FLOMAX) 0.4 MG CAPS capsule Take 1 capsule (0.4 mg total) by mouth daily. 06/21/20   Charlynne Cousins, MD  warfarin (COUMADIN) 2.5 MG tablet Take 2.5 mg by mouth Mon, Wed, and Fri 11/30/21   [provider]  warfarin (COUMADIN) 2.5 MG tablet Take 1/2 tab by mouth on Tue,Thurs, Sat, and Sun    [provider]    Physical Exam: Vitals:   12/22/21 1330 12/22/21 1400 12/22/21 1430 12/22/21 1500  BP: (!) 120/52 (!) 121/53 (!) 119/54 (!) 110/48  Pulse: 74 77 77 73  Resp: 20 20 (!) 22 (!) 25  Temp:      TempSrc:      SpO2: 95% 92% 92% 92%   General: 88 y.o. male resting in bed in NAD Eyes: PERRL, normal sclera ENMT: Nares patent w/o discharge, orophaynx clear, dentition normal, ears w/o discharge/lesions/ulcers Neck: Supple, trachea midline Cardiovascular: RRR, +S1, S2, no m/g/r, equal pulses throughout Respiratory: decreased at bases, no w/r/r, normal WOB on 2L [place] GI: BS+, NDNT, no masses noted, no organomegaly noted MSK: No e/c/c Neuro: A&O x 3, no focal deficits Psyc: Appropriate interaction and affect, calm/cooperative  Data Reviewed:  Results for orders placed or performed during the hospital encounter of 12/22/21 (from the past 24 hour(s))  Comprehensive metabolic panel     Status: Abnormal   Collection Time: 12/22/21 12:18 PM  Result Value Ref Range   Sodium 134 (L) 135 - 145 mmol/L   Potassium 4.1 3.5 - 5.1 mmol/L   Chloride 102 98 - 111 mmol/L   CO2 24 22 - 32 mmol/L   Glucose, Bld 94 70 - 99 mg/dL   BUN 47 (H) 8 - 23 mg/dL   Creatinine, Ser 1.28 (H) 0.61 - 1.24 mg/dL   Calcium 8.9 8.9 - 10.3 mg/dL   Total Protein 7.2 6.5 - 8.1 g/dL   Albumin 3.2 (L) 3.5 - 5.0 g/dL   AST 19 15 - 41 U/L   ALT 13 0 - 44 U/L   Alkaline Phosphatase 108 38 - 126 U/L   Total Bilirubin 1.6 (H) 0.3 - 1.2 mg/dL   GFR, Estimated 52 (L) >60 mL/min   Anion gap 8 5 - 15  Lactic acid, plasma     Status: None   Collection Time: 12/22/21 12:18 PM  Result Value Ref Range  Lactic Acid, Venous 1.8 0.5 - 1.9 mmol/L  CBC with Differential     Status: Abnormal   Collection Time: 12/22/21 12:18 PM  Result Value Ref Range   WBC 5.8 4.0 - 10.5 K/uL   RBC 3.81 (L) 4.22 - 5.81 MIL/uL   Hemoglobin 11.2 (L) 13.0 - 17.0 g/dL   HCT 34.4 (L) 39.0 - 52.0 %   MCV 90.3 80.0 - 100.0 fL   MCH 29.4 26.0 - 34.0 pg   MCHC 32.6 30.0 - 36.0 g/dL   RDW 17.3 (H) 11.5 - 15.5 %   Platelets 98 (L) 150 - 400 K/uL   nRBC 0.0 0.0 - 0.2 %   Neutrophils Relative % 96 %   Neutro Abs 5.5 1.7 - 7.7 K/uL   Lymphocytes Relative 2 %   Lymphs Abs 0.1 (L) 0.7 - 4.0 K/uL   Monocytes Relative 2 %   Monocytes Absolute 0.1 0.1 - 1.0 K/uL   Eosinophils Relative 0 %   Eosinophils Absolute 0.0 0.0 - 0.5 K/uL   Basophils Relative 0 %   Basophils Absolute 0.0 0.0 - 0.1 K/uL   Immature Granulocytes 0 %   Abs Immature Granulocytes 0.02 0.00 - 0.07 K/uL  Protime-INR     Status: Abnormal   Collection Time: 12/22/21 12:18 PM  Result Value Ref Range   Prothrombin Time 18.4 (H) 11.4 - 15.2 seconds   INR 1.5 (H) 0.8 - 1.2  APTT     Status: None   Collection Time: 12/22/21 12:18 PM  Result Value Ref Range   aPTT 31 24 - 36 seconds  Brain natriuretic peptide     Status: Abnormal   Collection Time: 12/22/21 12:18 PM  Result Value Ref Range   B Natriuretic Peptide 1,164.9 (H) 0.0 - 100.0 pg/mL  SARS Coronavirus 2 by RT PCR (hospital order, performed in [place] hospital lab) *cepheid single result test* Anterior Nasal Swab     Status: None   Collection Time: 12/22/21  1:20 PM   Specimen: Anterior Nasal Swab  Result Value Ref Range   SARS Coronavirus 2 by RT PCR NEGATIVE NEGATIVE  Urinalysis, Routine w reflex microscopic Urine, Clean Catch     Status: Abnormal   Collection Time: 12/22/21  2:56 PM  Result Value Ref Range   Color, Urine YELLOW YELLOW   APPearance HAZY (A) CLEAR   Specific Gravity, Urine 1.011 1.005 - 1.030   pH 5.0 5.0 - 8.0   Glucose, UA NEGATIVE NEGATIVE mg/dL   Hgb urine  dipstick SMALL (A) NEGATIVE   Bilirubin Urine NEGATIVE NEGATIVE   Ketones, ur NEGATIVE NEGATIVE mg/dL   Protein, ur NEGATIVE NEGATIVE mg/dL   Nitrite POSITIVE (A) NEGATIVE   Leukocytes,Ua LARGE (A) NEGATIVE   RBC / HPF 0-5 0 - 5 RBC/hpf   WBC, UA >50 (H) 0 - 5 WBC/hpf   Bacteria, UA RARE (A) NONE SEEN   Mucus PRESENT    Hyaline Casts, UA PRESENT    CXR: Cardiomegaly with vascular congestion, interstitial prominence and perihilar/lower lobe opacities concerning for edema/CHF. Small layering effusions.  EKG: a fib, RBBB  Assessment and Plan: Acute on chronic HFrEF     - place in obs, tele     - he was started on lasix '40mg'$  IV in ED; continue as BP tolerates     - check echo     - EKG: a fib RBB previously seen; no chest pain  UTI     - continue rocephin, follow UCx  Persistent a fib     - continue anticoagulation     - BP is soft; hold BP affecting meds tonight and reassess for AM  HLD CAD     - continue home regimen  HTN     - BP is soft; hold home regimen for tonight  BPH     - BP soft; hold home regimen for tonight  Anxiety     - continue home regimen  Advance Care Planning:   Code Status: DNI  Consults: None  Family Communication: w/ son at bedside  Severity of Illness: The appropriate patient status for this patient is OBSERVATION. Observation status is judged to be reasonable and necessary in order to provide the required intensity of service to ensure the patient's safety. The patient's presenting symptoms, physical exam findings, and initial radiographic and laboratory data in the context of their medical condition is felt to place them at decreased risk for further clinical deterioration. Furthermore, it is anticipated that the patient will be medically stable for discharge from the hospital within 2 midnights of admission.   Author: Jonnie Finner, DO 12/22/2021 3:50 PM  For on call review [email or web address].

## 2021-12-22 NOTE — ED Triage Notes (Signed)
BIBA Per EMS: Pt coming from [place] w/ c/o SHOB 85% RA. Neb given en route. Wheezes noted in lower lobes. 100.0 temp. Tylenol prior to EMS arrival. '1000mg'$  given.

## 2021-12-23 ENCOUNTER — Observation Stay (HOSPITAL_COMMUNITY): Payer: Medicare Other

## 2021-12-23 ENCOUNTER — Other Ambulatory Visit: Payer: Self-pay

## 2021-12-23 ENCOUNTER — Encounter (HOSPITAL_COMMUNITY): Payer: Self-pay | Admitting: Internal Medicine

## 2021-12-23 DIAGNOSIS — I700 Atherosclerosis of aorta: Secondary | ICD-10-CM | POA: Diagnosis present

## 2021-12-23 DIAGNOSIS — Z79899 Other long term (current) drug therapy: Secondary | ICD-10-CM | POA: Diagnosis not present

## 2021-12-23 DIAGNOSIS — Z743 Need for continuous supervision: Secondary | ICD-10-CM | POA: Diagnosis not present

## 2021-12-23 DIAGNOSIS — I272 Pulmonary hypertension, unspecified: Secondary | ICD-10-CM | POA: Diagnosis present

## 2021-12-23 DIAGNOSIS — I130 Hypertensive heart and chronic kidney disease with heart failure and stage 1 through stage 4 chronic kidney disease, or unspecified chronic kidney disease: Secondary | ICD-10-CM | POA: Diagnosis present

## 2021-12-23 DIAGNOSIS — Z86711 Personal history of pulmonary embolism: Secondary | ICD-10-CM | POA: Diagnosis not present

## 2021-12-23 DIAGNOSIS — M7989 Other specified soft tissue disorders: Secondary | ICD-10-CM | POA: Diagnosis present

## 2021-12-23 DIAGNOSIS — I5021 Acute systolic (congestive) heart failure: Secondary | ICD-10-CM | POA: Diagnosis not present

## 2021-12-23 DIAGNOSIS — B9620 Unspecified Escherichia coli [E. coli] as the cause of diseases classified elsewhere: Secondary | ICD-10-CM | POA: Diagnosis present

## 2021-12-23 DIAGNOSIS — I739 Peripheral vascular disease, unspecified: Secondary | ICD-10-CM | POA: Diagnosis present

## 2021-12-23 DIAGNOSIS — Z1623 Resistance to quinolones and fluoroquinolones: Secondary | ICD-10-CM | POA: Diagnosis present

## 2021-12-23 DIAGNOSIS — E7800 Pure hypercholesterolemia, unspecified: Secondary | ICD-10-CM | POA: Diagnosis present

## 2021-12-23 DIAGNOSIS — I5023 Acute on chronic systolic (congestive) heart failure: Secondary | ICD-10-CM | POA: Diagnosis present

## 2021-12-23 DIAGNOSIS — I2510 Atherosclerotic heart disease of native coronary artery without angina pectoris: Secondary | ICD-10-CM | POA: Diagnosis present

## 2021-12-23 DIAGNOSIS — N400 Enlarged prostate without lower urinary tract symptoms: Secondary | ICD-10-CM | POA: Diagnosis present

## 2021-12-23 DIAGNOSIS — Z7901 Long term (current) use of anticoagulants: Secondary | ICD-10-CM | POA: Diagnosis not present

## 2021-12-23 DIAGNOSIS — R531 Weakness: Secondary | ICD-10-CM | POA: Diagnosis not present

## 2021-12-23 DIAGNOSIS — K7460 Unspecified cirrhosis of liver: Secondary | ICD-10-CM | POA: Diagnosis present

## 2021-12-23 DIAGNOSIS — Z953 Presence of xenogenic heart valve: Secondary | ICD-10-CM | POA: Diagnosis not present

## 2021-12-23 DIAGNOSIS — N1831 Chronic kidney disease, stage 3a: Secondary | ICD-10-CM | POA: Diagnosis present

## 2021-12-23 DIAGNOSIS — F419 Anxiety disorder, unspecified: Secondary | ICD-10-CM | POA: Diagnosis present

## 2021-12-23 DIAGNOSIS — I4821 Permanent atrial fibrillation: Secondary | ICD-10-CM | POA: Diagnosis present

## 2021-12-23 DIAGNOSIS — Z85828 Personal history of other malignant neoplasm of skin: Secondary | ICD-10-CM | POA: Diagnosis not present

## 2021-12-23 DIAGNOSIS — I495 Sick sinus syndrome: Secondary | ICD-10-CM | POA: Diagnosis present

## 2021-12-23 DIAGNOSIS — Z1152 Encounter for screening for COVID-19: Secondary | ICD-10-CM | POA: Diagnosis not present

## 2021-12-23 DIAGNOSIS — N3000 Acute cystitis without hematuria: Secondary | ICD-10-CM | POA: Diagnosis present

## 2021-12-23 DIAGNOSIS — K219 Gastro-esophageal reflux disease without esophagitis: Secondary | ICD-10-CM | POA: Diagnosis present

## 2021-12-23 DIAGNOSIS — Z7401 Bed confinement status: Secondary | ICD-10-CM | POA: Diagnosis not present

## 2021-12-23 LAB — BLOOD CULTURE ID PANEL (REFLEXED) - BCID2

## 2021-12-23 LAB — COMPREHENSIVE METABOLIC PANEL
ALT: 13 U/L (ref 0–44)
AST: 15 U/L (ref 15–41)
Albumin: 2.8 g/dL — ABNORMAL LOW (ref 3.5–5.0)
Alkaline Phosphatase: 87 U/L (ref 38–126)
Anion gap: 8 (ref 5–15)
BUN: 44 mg/dL — ABNORMAL HIGH (ref 8–23)
CO2: 22 mmol/L (ref 22–32)
Calcium: 8.6 mg/dL — ABNORMAL LOW (ref 8.9–10.3)
Chloride: 103 mmol/L (ref 98–111)
Creatinine, Ser: 1.17 mg/dL (ref 0.61–1.24)
GFR, Estimated: 58 mL/min — ABNORMAL LOW (ref >60)
Glucose, Bld: 96 mg/dL (ref 70–99)
Potassium: 4.5 mmol/L (ref 3.5–5.1)
Sodium: 133 mmol/L — ABNORMAL LOW (ref 135–145)
Total Bilirubin: 1.1 mg/dL (ref 0.3–1.2)
Total Protein: 6.3 g/dL — ABNORMAL LOW (ref 6.5–8.1)

## 2021-12-23 LAB — ECHOCARDIOGRAM COMPLETE
AR max vel: 4.08 cm2
AV Area VTI: 4.29 cm2
AV Area mean vel: 4.23 cm2
AV Mean grad: 3.7 mmHg
AV Peak grad: 7.3 mmHg
Ao pk vel: 1.35 m/s
Area-P 1/2: 5.27 cm2
Calc EF: 47.6 %
MV M vel: 5.78 m/s
MV Peak grad: 133.4 mmHg
P 1/2 time: 512 msec
Radius: 0.4 cm
S' Lateral: 3.9 cm
Single Plane A2C EF: 43.5 %
Single Plane A4C EF: 52.1 %

## 2021-12-23 LAB — PROTIME-INR
INR: 1.6 — ABNORMAL HIGH (ref 0.8–1.2)
Prothrombin Time: 19.1 seconds — ABNORMAL HIGH (ref 11.4–15.2)

## 2021-12-23 LAB — CBC
HCT: 33 % — ABNORMAL LOW (ref 39.0–52.0)
Hemoglobin: 10.7 g/dL — ABNORMAL LOW (ref 13.0–17.0)
MCH: 29.5 pg (ref 26.0–34.0)
MCHC: 32.4 g/dL (ref 30.0–36.0)
MCV: 90.9 fL (ref 80.0–100.0)
Platelets: 103 10*3/uL — ABNORMAL LOW (ref 150–400)
RBC: 3.63 MIL/uL — ABNORMAL LOW (ref 4.22–5.81)
RDW: 17.3 % — ABNORMAL HIGH (ref 11.5–15.5)
WBC: 10.2 10*3/uL (ref 4.0–10.5)
nRBC: 0 % (ref 0.0–0.2)

## 2021-12-23 MED ORDER — WARFARIN SODIUM 4 MG PO TABS
4.0000 mg | ORAL_TABLET | Freq: Once | ORAL | Status: AC
  Administered 2021-12-23: 4 mg via ORAL
  Filled 2021-12-23: qty 1

## 2021-12-23 MED ORDER — SODIUM CHLORIDE 0.9 % IV SOLN
2.0000 g | INTRAVENOUS | Status: DC
  Administered 2021-12-23 – 2021-12-26 (×4): 2 g via INTRAVENOUS
  Filled 2021-12-23 (×4): qty 20

## 2021-12-23 MED ORDER — METOPROLOL SUCCINATE ER 25 MG PO TB24
12.5000 mg | ORAL_TABLET | Freq: Every day | ORAL | Status: DC
  Administered 2021-12-24 – 2021-12-26 (×3): 12.5 mg via ORAL
  Filled 2021-12-23 (×3): qty 1

## 2021-12-23 MED ORDER — SACUBITRIL-VALSARTAN 24-26 MG PO TABS
1.0000 | ORAL_TABLET | Freq: Two times a day (BID) | ORAL | Status: DC
  Administered 2021-12-24 – 2021-12-26 (×5): 1 via ORAL
  Filled 2021-12-23 (×6): qty 1

## 2021-12-23 NOTE — Progress Notes (Signed)
  Echocardiogram 2D Echocardiogram has been performed.  Fidel Levy 12/23/2021, 9:01 AM

## 2021-12-23 NOTE — Progress Notes (Signed)
On Admission to 1412. Skin Assessment done by 2 RNs. Skin tear noted to left mid forearm cleansed and covered with pad. Bilateral Lower Extremities noted to bee discolored and skin very dry and flaky. Left lower leg noted to have an that seems to have had an old skin tear covered with mepiplex dressing.

## 2021-12-23 NOTE — Progress Notes (Signed)
Consultation Progress Note   Patient: Tyler Williams DGU:[id number] DOB: 1926-10-24 DOA: 12/22/2021 DOS: the patient was seen and examined on 12/23/2021 Primary service: Tyler Williams, Tyler Shirts, MD  Brief hospital course:  Tyler Williams is a 88 y.o. male with medical history significant of HLD, HTN, a fib, HFrEF, CAD, AAA s/p repair, aortic stenosis s/p TAVR, GERD. Presenting with shortness of breath. He was in his normal state of health until about 3 days ago. He noticed then that he was more fatigued and short of breath. It was harder for him to get around to do activities. He didn't have any cough. He didn't have any chest pain or leg swelling. He didn't have any palpitations. He noticed some wheeze last night, but he seemed to feel better if he propped his head up with pillows. This morning he noticed that he was having chills and it was even more difficult to breathe. He became concerned and called for EMS. He denies any other aggravating or alleviating factors.      Assessment and Plan: Acute on chronic HFrEF     - place in obs, tele     - he was started on lasix '40mg'$  IV  continue as BP tolerates     - check echo     - EKG: a fib RBB previously seen; no chest pain   UTI   - continue rocephin, follow UCx   Persistent a fib     - continue anticoagulation      -Resume metoprolol as able. Holding due to bradycardia  HLD CAD     - continue home regimen   HTN     - Restarted, Titrate as BP allows   BPH     - BP soft; hold home regimen for tonight   Anxiety     - continue home regimen       TR[facility] will continue to follow the patient.  Subjective: Seen at bedside, has no complaints. He denies chest pain, fever, nausea, vomiting. He just had his echo done.  Physical Exam:  General: Alert in no acute distress. Eyes: PERRL, normal sclera ENMT: Nares patent w/o discharge, orophaynx clear, dentition normal, ears w/o discharge/lesions/ulcers Neck: Supple, trachea  midline Cardiovascular: RRR, +S1, S2, no m/g/r, equal pulses throughout Respiratory: decreased at bases, no w/r/r, normal WOB on 2L [place] GI: BS+, NDNT, no masses noted, no organomegaly noted MSK: No e/c/c Neuro: A&O x 3, no focal deficits Psyc: Appropriate interaction and affect, calm/cooperative Vitals:   12/23/21 0530 12/23/21 0630 12/23/21 0725 12/23/21 1027  BP: 135/63 115/60  127/60  Pulse: (!) 55 (!) 52  (!) 58  Resp: (!) 28 (!) 22  18  Temp:   (!) 97.3 F (36.3 C) (!) 97.4 F (36.3 C)  TempSrc:   Oral Oral  SpO2: 99% 97%  98%    Data Reviewed:  There are no new results to review at this time.  Family Communication:   Time spent: 15 minutes.  Author: Cristela Felt, MD 12/23/2021 12:23 PM  For on call review [email or web address].

## 2021-12-23 NOTE — Progress Notes (Signed)
ANTICOAGULATION CONSULT NOTE  Pharmacy Consult for coumadin Indication: atrial fibrillation  No Known Allergies  Patient Measurements:   Heparin Dosing Weight:   Vital Signs: Temp: 97.4 F (36.3 C) (11/10 1027) Temp Source: Oral (11/10 1027) BP: 127/60 (11/10 1027) Pulse Rate: 58 (11/10 1027)  Labs: Recent Labs    12/22/21 1218 12/23/21 0500  HGB 11.2* 10.7*  HCT 34.4* 33.0*  PLT 98* 103*  APTT 31  --   LABPROT 18.4* 19.1*  INR 1.5* 1.6*  CREATININE 1.28* 1.17    CrCl cannot be calculated (Unknown ideal weight.).  Medications:  Warfarin 2.5 mg MTuWThFri & warfarin 1.25 mg Sat/Sun - last dose 11/8 at 1700 (2.5 mg)   Assessment: 88 yo M on warfarin PTA for Afib. Pharmacy to dose while in patient. Admit INR 1.5 - below goal Hg 11.2, PLT low  @ 98 12/23/2021 INR 1.6 - below goal Hg 10.7 - stable, PLT 103 - remains low No bleeding reported  Goal of Therapy:  INR 2-3 Monitor platelets by anticoagulation protocol: Yes   Plan:  Warfarin 4 mg po x 1 dose today continue LMWH until INR > = 2 Daily INR  Eudelia Bunch, Pharm.D Use secure chat for questions 12/23/2021 10:32 AM

## 2021-12-23 NOTE — Progress Notes (Signed)
PHARMACY - PHYSICIAN COMMUNICATION CRITICAL VALUE ALERT - BLOOD CULTURE IDENTIFICATION (BCID)  Tyler Williams is an 88 y.o. male who presented to [facility] on 12/22/2021 with a chief complaint of shortness of breath.  Assessment:  Admitted with HF exacerbation on ceftriaxone for possible UTI.   BCID + Ecoli, no resistance  Name of physician (or Provider) Contacted: [name], NP  Current antibiotics: Ceftriaxone 1gm IV q24h  Changes to prescribed antibiotics recommended:  Increase ceftriaxone to 2gm IV q24h  Results for orders placed or performed during the hospital encounter of 12/22/21  Blood Culture ID Panel (Reflexed) (Collected: 12/22/2021 12:18 PM)  Result Value Ref Range   Enterococcus faecalis NOT DETECTED NOT DETECTED   Enterococcus Faecium NOT DETECTED NOT DETECTED   Listeria monocytogenes NOT DETECTED NOT DETECTED   Staphylococcus species NOT DETECTED NOT DETECTED   Staphylococcus aureus (BCID) NOT DETECTED NOT DETECTED   Staphylococcus epidermidis NOT DETECTED NOT DETECTED   Staphylococcus lugdunensis NOT DETECTED NOT DETECTED   Streptococcus species NOT DETECTED NOT DETECTED   Streptococcus agalactiae NOT DETECTED NOT DETECTED   Streptococcus pneumoniae NOT DETECTED NOT DETECTED   Streptococcus pyogenes NOT DETECTED NOT DETECTED   A.calcoaceticus-baumannii NOT DETECTED NOT DETECTED   Bacteroides fragilis NOT DETECTED NOT DETECTED   Enterobacterales DETECTED (A) NOT DETECTED   Enterobacter cloacae complex NOT DETECTED NOT DETECTED   Escherichia coli DETECTED (A) NOT DETECTED   Klebsiella aerogenes NOT DETECTED NOT DETECTED   Klebsiella oxytoca NOT DETECTED NOT DETECTED   Klebsiella pneumoniae NOT DETECTED NOT DETECTED   Proteus species NOT DETECTED NOT DETECTED   Salmonella species NOT DETECTED NOT DETECTED   Serratia marcescens NOT DETECTED NOT DETECTED   Haemophilus influenzae NOT DETECTED NOT DETECTED   Neisseria meningitidis NOT DETECTED NOT DETECTED    Pseudomonas aeruginosa NOT DETECTED NOT DETECTED   Stenotrophomonas maltophilia NOT DETECTED NOT DETECTED   Candida albicans NOT DETECTED NOT DETECTED   Candida auris NOT DETECTED NOT DETECTED   Candida glabrata NOT DETECTED NOT DETECTED   Candida krusei NOT DETECTED NOT DETECTED   Candida parapsilosis NOT DETECTED NOT DETECTED   Candida tropicalis NOT DETECTED NOT DETECTED   Cryptococcus neoformans/gattii NOT DETECTED NOT DETECTED   CTX-M ESBL NOT DETECTED NOT DETECTED   Carbapenem resistance IMP NOT DETECTED NOT DETECTED   Carbapenem resistance KPC NOT DETECTED NOT DETECTED   Carbapenem resistance NDM NOT DETECTED NOT DETECTED   Carbapenem resist OXA 48 LIKE NOT DETECTED NOT DETECTED   Carbapenem resistance VIM NOT DETECTED NOT DETECTED    Netta Cedars PharmD 12/23/2021  6:19 AM

## 2021-12-24 DIAGNOSIS — I5023 Acute on chronic systolic (congestive) heart failure: Secondary | ICD-10-CM | POA: Diagnosis not present

## 2021-12-24 LAB — URINE CULTURE: Culture: >=100000 — AB

## 2021-12-24 LAB — PROTIME-INR
INR: 1.7 — ABNORMAL HIGH (ref 0.8–1.2)
Prothrombin Time: 20.2 seconds — ABNORMAL HIGH (ref 11.4–15.2)

## 2021-12-24 MED ORDER — SPIRONOLACTONE 25 MG PO TABS
25.0000 mg | ORAL_TABLET | Freq: Every day | ORAL | Status: DC
  Administered 2021-12-24 – 2021-12-26 (×3): 25 mg via ORAL
  Filled 2021-12-24 (×3): qty 1

## 2021-12-24 MED ORDER — WARFARIN SODIUM 4 MG PO TABS
4.0000 mg | ORAL_TABLET | Freq: Once | ORAL | Status: AC
  Administered 2021-12-24: 4 mg via ORAL
  Filled 2021-12-24: qty 1

## 2021-12-24 MED ORDER — ISOSORBIDE MONONITRATE ER 60 MG PO TB24
60.0000 mg | ORAL_TABLET | Freq: Every day | ORAL | Status: DC
  Administered 2021-12-24 – 2021-12-26 (×3): 60 mg via ORAL
  Filled 2021-12-24 (×3): qty 1

## 2021-12-24 MED ORDER — FINASTERIDE 5 MG PO TABS
5.0000 mg | ORAL_TABLET | Freq: Every day | ORAL | Status: DC
  Administered 2021-12-24 – 2021-12-26 (×3): 5 mg via ORAL
  Filled 2021-12-24 (×3): qty 1

## 2021-12-24 MED ORDER — ORAL CARE MOUTH RINSE: 15.0000 mL | OROMUCOSAL | Status: DC | PRN

## 2021-12-24 MED ORDER — TAMSULOSIN HCL 0.4 MG PO CAPS
0.4000 mg | ORAL_CAPSULE | Freq: Every day | ORAL | Status: DC
  Administered 2021-12-24 – 2021-12-26 (×3): 0.4 mg via ORAL
  Filled 2021-12-24 (×3): qty 1

## 2021-12-24 MED ORDER — FESOTERODINE FUMARATE ER 8 MG PO TB24
8.0000 mg | ORAL_TABLET | Freq: Every day | ORAL | Status: DC
  Administered 2021-12-24 – 2021-12-26 (×3): 8 mg via ORAL
  Filled 2021-12-24 (×3): qty 1

## 2021-12-24 NOTE — Progress Notes (Addendum)
Consultation Progress Note   Patient: Tyler Williams UXN:[id number] DOB: 04-26-1926 DOA: 12/22/2021 DOS: the patient was seen and examined on 12/24/2021 Primary service: Lorella Nimrod, MD  Brief hospital course:  Tyler Williams is a 88 y.o. male with medical history significant of HLD, HTN, a fib, HFrEF, CAD, AAA s/p repair, aortic stenosis s/p TAVR, GERD. Presenting with shortness of breath. He was in his normal state of health until about 3 days ago. He noticed then that he was more fatigued and short of breath. It was harder for him to get around to do activities. He didn't have any cough. He didn't have any chest pain or leg swelling. He didn't have any palpitations. He noticed some wheeze last night, but he seemed to feel better if he propped his head up with pillows. This morning he noticed that he was having chills and it was even more difficult to breathe. He became concerned and called for EMS. He denies any other aggravating or alleviating factors.   11/11: Hemodynamically stable, on 2 L of oxygen, echocardiogram with EF of 45 to 50%, pretty much no acute changes as compared to prior echo. UOP recorded of 250 cc only.  Wt decreased to 166 pounds from 173 pounds. Blood and urine cultures both growing E. coli-pending susceptibility.   Assessment and Plan: Acute on chronic HFrEF     BNP elevated at 1164.  Repeat echocardiogram with EF of 45 to 50%, pretty much similar to the prior echocardiogram. -Continue with IV Lasix -Daily BMP and weight -Strict intake and output. -Continue spironolactone -Continue Entresto   Sepsis secondary to E. coli bacteremia.  Patient met sepsis criteria with tachypnea and hypothermia.  Urine and blood cultures growing E. coli, only resistant to Cipro.   -Continue with ceftriaxone   -Repeat blood cultures tomorrow  Persistent atrial fibrillation.     - continue Coumadin per pharmacy     -Continue home metoprolol  History of  CAD/hyperlipidemia No chest pain     -Restart home Imdur -Continue statin    Hypertension.  Blood pressure mildly elevated. -Restart home spironolactone and Imdur -Continue Lasix and Entresto  BPH. -Restart home Flomax and finasteride  Anxiety     - continue home regimen   Subjective: Patient was seen and examined today.  No new complaint.  Physical Exam:  General.  Frail elderly man, in no acute distress. Pulmonary.  Lungs clear bilaterally, normal respiratory effort. CV.  Regular rate and rhythm, no JVD, rub or murmur. Abdomen.  Soft, nontender, nondistended, BS positive. CNS.  Alert and oriented .  No focal neurologic deficit. Extremities.  1+ LE edema with signs of chronic venous congestion Psychiatry.  Judgment and insight appears normal.   Vitals:   12/23/21 1700 12/23/21 1900 12/23/21 2344 12/24/21 0500  BP:  (!) 166/78 (!) 145/67   Pulse:  78 74   Resp:   (!) 22   Temp:  97.9 F (36.6 C) 98.5 F (36.9 C)   TempSrc:  Oral Oral   SpO2:  100% 95%   Weight: 78.7 kg   75.5 kg  Height: _0  (1.727 m)       Data Reviewed: Prior data reviewed  Family Communication: Discussed with son on phone.  Time spent: 50 minutes.  This record has been created using Systems analyst. Errors have been sought and corrected,but may not always be located. Such creation errors do not reflect on the standard of care.   Author: Lorella Nimrod, MD  12/24/2021 8:13 AM  For on call review [email or web address].

## 2021-12-24 NOTE — TOC Initial Note (Signed)
Transition of Care [facility]) - Initial/Assessment Note    Patient Details  Name: Tyler Williams MRN: [id number] Date of Birth: [birth year]  [facility] of Care [facility]) CM/SW Contact:    Vassie Moselle, LCSW Phone Number: 12/24/2021, 3:55 PM  Clinical Narrative:                 Pt from [facility]. CSW spoke with pt's son and confirmed this. Current plan will be for pt to return to [facility] at discharge. TOC will continue to follow for discharge needs.  Expected Discharge Plan: Assisted Living Barriers to Discharge: Continued Medical Work up   Patient Goals and CMS Choice Patient states their goals for this hospitalization and ongoing recovery are:: To return to [employer].gov Compare Post Acute Care list provided to:: Patient Choice offered to / list presented to : Patient, Adult Children  Expected Discharge Plan and Services Expected Discharge Plan: Assisted Living In-house Referral: NA Discharge Planning Services: CM Consult Post Acute Care Choice: Nursing Home Living arrangements for the past 2 months: [place]                 DME Arranged: N/A DME Agency: NA                  Prior Living Arrangements/Services Living arrangements for the past 2 months: [place] Lives with:: Facility Resident Patient language and need for interpreter reviewed:: Yes Do you feel safe going back to the place where you live?: Yes      Need for Family Participation in Patient Care: Yes (Comment) Care giver support system in place?: Yes (comment) Current home services: DME Criminal Activity/Legal Involvement Pertinent to Current Situation/Hospitalization: No - Comment as needed  Activities of Daily Living Home Assistive Devices/Equipment: Eyeglasses, Walker (specify type), Raised toilet seat with rails, Shower chair with back, Dentures (specify type) (walker with a seat, upper partial) ADL Screening (condition at time  of admission) Patient's cognitive ability adequate to safely complete daily activities?: Yes Is the patient deaf or have difficulty hearing?: No Does the patient have difficulty seeing, even when wearing glasses/contacts?: Yes (has macular degeneration in both eyes) Does the patient have difficulty concentrating, remembering, or making decisions?: No Patient able to express need for assistance with ADLs?: Yes Does the patient have difficulty dressing or bathing?: Yes Independently performs ADLs?: No Communication: Independent Dressing (OT): Needs assistance Is this a change from baseline?: Pre-admission baseline Grooming: Independent Feeding: Needs assistance (pt can't see the plate due to macular degeneration) Is this a change from baseline?: Pre-admission baseline Bathing: Needs assistance Is this a change from baseline?: Pre-admission baseline Toileting: Needs assistance Is this a change from baseline?: Pre-admission baseline In/Out Bed: Needs assistance Is this a change from baseline?: Pre-admission baseline Walks in Home: Needs assistance (uses a walker with a seat) Is this a change from baseline?: Pre-admission baseline Does the patient have difficulty walking or climbing stairs?: Yes Weakness of Legs: Both Weakness of Arms/Hands: Both  Permission Sought/Granted Permission sought to share information with : Facility Sport and exercise psychologist, Family Supports Permission granted to share information with : Yes, Verbal Permission Granted  Share Information with NAME: Ahnaf Caponi     Permission granted to share info w Relationship: Son  Permission granted to share info w Contact Information: [phone]  Emotional Assessment Appearance:: Appears stated age Attitude/Demeanor/Rapport: Engaged, Charismatic Affect (typically observed): Pleasant Orientation: : Oriented to Self, Oriented to Place Alcohol / Substance Use: Not Applicable  Psych Involvement: No (comment)  Admission  diagnosis:  Acute cystitis without hematuria [N30.00] Acute on chronic systolic (congestive) heart failure ([place]) [I50.23] Community acquired pneumonia, unspecified laterality [J18.9] Acute on chronic congestive heart failure, unspecified heart failure type [facility]) [I50.9] Patient Active Problem List   Diagnosis Date Noted   Acute on chronic systolic (congestive) heart failure ([place]) 12/22/2021   UTI (urinary tract infection) 12/22/2021   Chest pain 11/21/2021   Elevated troponin 11/21/2021   Normocytic anemia 11/21/2021   Chronic kidney disease, stage III (moderate) ([place]) 11/21/2021   Thrombocytopenia ([place]) 11/21/2021   BPH (benign prostatic hyperplasia) 11/21/2021   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) 10/05/2021   Hypotension 10/05/2021   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   Pain of left hand 08/12/2020   Abrasion of unspecified back wall of thorax, initial encounter 07/23/2020   Bite of nonvenomous arthropod 07/23/2020   Fall 07/23/2020   Hearing loss 07/23/2020   Laceration without foreign body of right elbow, initial encounter 07/23/2020   Influenza A    Gross hematuria    DCM (dilated cardiomyopathy) ([place])    Chronic atrial fibrillation (HCC)    Congestive heart failure ([place]) 06/12/2020   Dyspnea 04/24/2019   Chronic obstructive pulmonary disease ([place]) 06/10/2018   Hardening of the aorta (main artery of the heart) ([place]) 06/10/2018   Chronic kidney disease, stage 2 (mild) 04/24/2018   Hypertensive heart and chronic kidney disease with heart failure and stage 1 through stage 4 chronic kidney disease, or unspecified chronic kidney disease ([place]) 04/24/2018   Aorta aneurysm ([place]) 02/14/2018   Cirrhosis of liver ([place]) 02/08/2018   Constipation 01/15/2018   Acute on chronic combined systolic and diastolic CHF (congestive heart failure) ([place]) 09/25/2017   Precordial chest pain 08/28/2017   HLD (hyperlipidemia) 08/28/2017   Anticoagulated on Coumadin 08/28/2017   HTN  (hypertension) 08/28/2017   S/P TAVR (transcatheter aortic valve replacement)    Presence of prosthetic heart valve 09/06/2016   S/P TAVR (transcatheter aortic valve replacement) 06/13/2016   Incidental cecal mass noted on CT imaging 05/24/2016   Inguinal hernia 02/24/2016   Exudative age-related macular 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 ([place]) 12/22/2014   Non-thrombocytopenic purpura ([place]) 08/19/2014   Encounter for therapeutic drug monitoring 03/20/2013   Pseudophakia of both eyes 03/19/2013   Macular degeneration 03/19/2013   Cholecystitis, acute 12/06/2012   Chest pain, atypical 12/03/2012   Hematuria, microscopic 12/03/2012   Anxiety disorder 06/20/2012   Long term (current) use of anticoagulants 06/20/2012   Cystoid macular edema 05/14/2012   Overweight (BMI 25.0-29.9) 01/23/2012   Hyponatremia 01/23/2012   S/P right UKR 01/22/2012   Carotid artery bruit 11/21/2011   Carotid bruit 11/21/2011   Presence of intraocular lens 02/16/2011   Osteoarthritis 07/05/2010   EDEMA 07/14/2009   Essential hypertension 01/27/2009   HYPERCHOLESTEROLEMIA  IIA 10/21/2008   CAD (coronary artery disease) 10/21/2008   Permanent atrial fibrillation ([place]) 10/21/2008   PCP:  Burnard Bunting, MD Pharmacy:   [place], [place] - [address] [place] [address] Phone: [phone] Fax: [phone]     Social Determinants of Health (SDOH) Interventions    Readmission Risk Interventions    12/24/2021    3:52 PM  Readmission Risk Prevention Plan  Transportation Screening Complete  PCP or Specialist Appt within 3-5 Days Complete  HRI or [place] Complete  Social Work Consult for [place] Planning/Counseling Complete  Palliative  Care Screening Not Applicable  Medication Review (RN Care Manager) Complete

## 2021-12-24 NOTE — Progress Notes (Signed)
ANTICOAGULATION CONSULT NOTE  Pharmacy Consult for coumadin Indication: atrial fibrillation  No Known Allergies  Patient Measurements: Height: '5\' 8"'$  (172.7 cm) Weight: 75.5 kg (166 lb 7.2 oz) IBW/kg (Calculated) : 68.4 Heparin Dosing Weight:   Vital Signs: Temp: 98.5 F (36.9 C) (11/10 2344) Temp Source: Oral (11/10 2344) BP: 145/67 (11/10 2344) Pulse Rate: 74 (11/10 2344)  Labs: Recent Labs    12/22/21 1218 12/23/21 0500 12/24/21 0545  HGB 11.2* 10.7*  --   HCT 34.4* 33.0*  --   PLT 98* 103*  --   APTT 31  --   --   LABPROT 18.4* 19.1* 20.2*  INR 1.5* 1.6* 1.7*  CREATININE 1.28* 1.17  --     Estimated Creatinine Clearance: 38.2 mL/min (by C-G formula based on SCr of 1.17 mg/dL).  Medications:  Warfarin 2.5 mg MTuWThFri & warfarin 1.25 mg Sat/Sun - last dose 11/8 at 1700 (2.5 mg)   Assessment: 88 yo M on warfarin PTA for Afib. Pharmacy to dose while in patient. Admit INR 1.5 - below goal  Today, 12/24/2021 INR 1.7 - below goal CBC yesterday: Hg 10.7 - stable, PLT 103 - remains low No bleeding reported No drug-drug interactions noted  Goal of Therapy:  INR 2-3 Monitor platelets by anticoagulation protocol: Yes   Plan:  Repeat boosted dose of Warfarin 4 mg po x 1 today continue LMWH until INR > = 2 Daily INR  Peggyann Juba, PharmD, BCPS Pharmacy: [phone] 12/24/2021 8:29 AM

## 2021-12-25 DIAGNOSIS — I5023 Acute on chronic systolic (congestive) heart failure: Secondary | ICD-10-CM | POA: Diagnosis not present

## 2021-12-25 LAB — CULTURE, BLOOD (ROUTINE X 2)
Special Requests: ADEQUATE
Special Requests: ADEQUATE

## 2021-12-25 LAB — BASIC METABOLIC PANEL
Anion gap: 7 (ref 5–15)
BUN: 42 mg/dL — ABNORMAL HIGH (ref 8–23)
CO2: 24 mmol/L (ref 22–32)
Calcium: 8.5 mg/dL — ABNORMAL LOW (ref 8.9–10.3)
Chloride: 105 mmol/L (ref 98–111)
Creatinine, Ser: 0.95 mg/dL (ref 0.61–1.24)
GFR, Estimated: >60 mL/min (ref >60)
Glucose, Bld: 93 mg/dL (ref 70–99)
Potassium: 4.3 mmol/L (ref 3.5–5.1)
Sodium: 136 mmol/L (ref 135–145)

## 2021-12-25 LAB — PROTIME-INR
INR: 2 — ABNORMAL HIGH (ref 0.8–1.2)
Prothrombin Time: 22.1 seconds — ABNORMAL HIGH (ref 11.4–15.2)

## 2021-12-25 MED ORDER — WARFARIN 1.25 MG HALF TABLET
1.2500 mg | ORAL_TABLET | Freq: Once | ORAL | Status: AC
  Administered 2021-12-25: 1.25 mg via ORAL
  Filled 2021-12-25: qty 1

## 2021-12-25 NOTE — Plan of Care (Signed)

## 2021-12-25 NOTE — Progress Notes (Signed)
Consultation Progress Note   Patient: Tyler Williams GYJ:[id number] DOB: [birth year] DOA: 12/22/2021 DOS: the patient was seen and examined on 12/25/2021 Primary service: Lorella Nimrod, MD  Brief hospital course:  Tyler Williams is a 88 y.o. male with medical history significant of HLD, HTN, a fib, HFrEF, CAD, AAA s/p repair, aortic stenosis s/p TAVR, GERD. Presenting with shortness of breath. He was in his normal state of health until about 3 days ago. He noticed then that he was more fatigued and short of breath. It was harder for him to get around to do activities. He didn't have any cough. He didn't have any chest pain or leg swelling. He didn't have any palpitations. He noticed some wheeze last night, but he seemed to feel better if he propped his head up with pillows. This morning he noticed that he was having chills and it was even more difficult to breathe. He became concerned and called for EMS. He denies any other aggravating or alleviating factors.   11/11: Hemodynamically stable, on 2 L of oxygen, echocardiogram with EF of 45 to 50%, pretty much no acute changes as compared to prior echo. UOP recorded of 250 cc only.  Wt decreased to 166 pounds from 173 pounds. Blood and urine cultures both growing E. coli-pending susceptibility.  11/12: Recorded output of 2290, net negative of -1469, weight 167 pound.  Renal function stable.  INR 2.0.  Repeat blood cultures pending.   Assessment and Plan: Acute on chronic HFrEF     BNP elevated at 1164.  Repeat echocardiogram with EF of 45 to 50%, pretty much similar to the prior echocardiogram. -Continue with IV Lasix -Daily BMP and weight -Strict intake and output. -Continue spironolactone -Continue Entresto   Sepsis secondary to E. coli bacteremia.  Patient met sepsis criteria with tachypnea and hypothermia.  Urine and blood cultures growing E. coli, only resistant to Cipro.   -Continue with ceftriaxone   -Repeat blood cultures  done today-pending.  Persistent atrial fibrillation.     - continue Coumadin per pharmacy     -Continue home metoprolol  History of CAD/hyperlipidemia No chest pain     -Restart home Imdur -Continue statin    Hypertension.  Blood pressure mildly elevated. -Restart home spironolactone and Imdur -Continue Lasix and Entresto  BPH. -Restart home Flomax and finasteride  Anxiety     - continue home regimen   Subjective: Patient was sitting in chair comfortably when seen today.  Denies any shortness of breath or pain.  Physical Exam:  General.  Frail elderly man, in no acute distress. Pulmonary.  Lungs clear bilaterally, normal respiratory effort. CV.  Regular rate and rhythm, no JVD, rub or murmur. Abdomen.  Soft, nontender, nondistended, BS positive. CNS.  Alert and oriented .  No focal neurologic deficit. Extremities.  Trace LE edema, signs of chronic venous congestion Psychiatry.  Judgment and insight appears normal. .   Vitals:   12/24/21 1001 12/24/21 1321 12/25/21 0459 12/25/21 0500  BP: (!) 141/64 114/83 138/75   Pulse: 73 67 (!) 57   Resp:  16 20   Temp: 98.3 F (36.8 C) 97.7 F (36.5 C) 97.9 F (36.6 C)   TempSrc: Oral Oral Oral   SpO2:  95% 94%   Weight:    75.8 kg  Height:        Data Reviewed: Prior data reviewed  Family Communication: Talked with Son on phone.  Time spent: 45 minutes.  This record has been created using [employer]  voice recognition software. Errors have been sought and corrected,but may not always be located. Such creation errors do not reflect on the standard of care.   Author: Lorella Nimrod, MD 12/25/2021 7:53 AM  For on call review [email or web address].

## 2021-12-25 NOTE — Progress Notes (Signed)
ANTICOAGULATION CONSULT NOTE  Pharmacy Consult for coumadin Indication: atrial fibrillation  No Known Allergies  Patient Measurements: Height: '5\' 8"'$  (172.7 cm) Weight: 75.8 kg (167 lb) IBW/kg (Calculated) : 68.4 Heparin Dosing Weight:   Vital Signs: Temp: 97.9 F (36.6 C) (11/12 0459) Temp Source: Oral (11/12 0459) BP: 138/75 (11/12 0459) Pulse Rate: 57 (11/12 0459)  Labs: Recent Labs    12/22/21 1218 12/23/21 0500 12/24/21 0545 12/25/21 0536  HGB 11.2* 10.7*  --   --   HCT 34.4* 33.0*  --   --   PLT 98* 103*  --   --   APTT 31  --   --   --   LABPROT 18.4* 19.1* 20.2* 22.1*  INR 1.5* 1.6* 1.7* 2.0*  CREATININE 1.28* 1.17  --  0.95    Estimated Creatinine Clearance: 47 mL/min (by C-G formula based on SCr of 0.95 mg/dL).  Medications:  Warfarin 2.5 mg MTuWThFri & warfarin 1.25 mg Sat/Sun - last dose 11/8 at 1700 (2.5 mg)   Assessment: 88 yo M on warfarin PTA for Afib. Pharmacy to dose while in patient. Admit INR 1.5 - below goal  Today, 12/25/2021 INR 2.0 - therapeutic CBC on 11/10: Hg 10.7 - stable, PLT 103 - remains low No bleeding reported No drug-drug interactions noted  Goal of Therapy:  INR 2-3 Monitor platelets by anticoagulation protocol: Yes   Plan:  Warfarin 1.25 mg po x 1 today Daily INR CBC in AM  Peggyann Juba, PharmD, [place]: [phone] 12/25/2021 9:08 AM

## 2021-12-26 DIAGNOSIS — I5023 Acute on chronic systolic (congestive) heart failure: Secondary | ICD-10-CM | POA: Diagnosis not present

## 2021-12-26 LAB — CBC
HCT: 36.8 % — ABNORMAL LOW (ref 39.0–52.0)
Hemoglobin: 11.5 g/dL — ABNORMAL LOW (ref 13.0–17.0)
MCH: 28.7 pg (ref 26.0–34.0)
MCHC: 31.3 g/dL (ref 30.0–36.0)
MCV: 91.8 fL (ref 80.0–100.0)
Platelets: 147 10*3/uL — ABNORMAL LOW (ref 150–400)
RBC: 4.01 MIL/uL — ABNORMAL LOW (ref 4.22–5.81)
RDW: 17.1 % — ABNORMAL HIGH (ref 11.5–15.5)
WBC: 5.4 10*3/uL (ref 4.0–10.5)
nRBC: 0 % (ref 0.0–0.2)

## 2021-12-26 LAB — BASIC METABOLIC PANEL
Anion gap: 8 (ref 5–15)
BUN: 33 mg/dL — ABNORMAL HIGH (ref 8–23)
CO2: 23 mmol/L (ref 22–32)
Calcium: 8.6 mg/dL — ABNORMAL LOW (ref 8.9–10.3)
Chloride: 106 mmol/L (ref 98–111)
Creatinine, Ser: 0.91 mg/dL (ref 0.61–1.24)
GFR, Estimated: >60 mL/min (ref >60)
Glucose, Bld: 92 mg/dL (ref 70–99)
Potassium: 4.1 mmol/L (ref 3.5–5.1)
Sodium: 137 mmol/L (ref 135–145)

## 2021-12-26 LAB — PROTIME-INR
INR: 2.1 — ABNORMAL HIGH (ref 0.8–1.2)
Prothrombin Time: 23.7 seconds — ABNORMAL HIGH (ref 11.4–15.2)

## 2021-12-26 MED ORDER — CEFADROXIL 500 MG PO CAPS: 1000.0000 mg | ORAL_CAPSULE | Freq: Two times a day (BID) | ORAL | 0 refills | Status: AC

## 2021-12-26 MED ORDER — POLYETHYLENE GLYCOL 3350 17 G PO PACK
17.0000 g | PACK | Freq: Once | ORAL | Status: AC
  Administered 2021-12-26: 17 g via ORAL
  Filled 2021-12-26: qty 1

## 2021-12-26 MED ORDER — POLYETHYLENE GLYCOL 3350 17 G PO PACK: 17.0000 g | PACK | Freq: Every day | ORAL | 0 refills | Status: AC | PRN

## 2021-12-26 MED ORDER — WARFARIN SODIUM 2.5 MG PO TABS
2.5000 mg | ORAL_TABLET | Freq: Once | ORAL | Status: AC
  Administered 2021-12-26: 2.5 mg via ORAL
  Filled 2021-12-26: qty 1

## 2021-12-26 MED ORDER — CEFADROXIL 500 MG PO CAPS: 1000.0000 mg | ORAL_CAPSULE | Freq: Two times a day (BID) | ORAL | Status: DC

## 2021-12-26 NOTE — Progress Notes (Signed)
ANTICOAGULATION CONSULT NOTE  Pharmacy Consult for coumadin Indication: atrial fibrillation  No Known Allergies  Patient Measurements: Height: '5\' 8"'$  (172.7 cm) Weight: 75.8 kg (167 lb) IBW/kg (Calculated) : 68.4 Heparin Dosing Weight:   Vital Signs: Temp: 98.8 F (37.1 C) (11/13 0521) Temp Source: Oral (11/13 0521) BP: 141/63 (11/13 0521) Pulse Rate: 66 (11/13 0521)  Labs: Recent Labs    12/24/21 0545 12/25/21 0536 12/26/21 0508  HGB  --   --  11.5*  HCT  --   --  36.8*  PLT  --   --  147*  LABPROT 20.2* 22.1* 23.7*  INR 1.7* 2.0* 2.1*  CREATININE  --  0.95 0.91    Estimated Creatinine Clearance: 49.1 mL/min (by C-G formula based on SCr of 0.91 mg/dL).  Medications:  Warfarin 2.5 mg MTuWThFri & warfarin 1.25 mg Sat/Sun - last dose 11/8 at 1700 (2.5 mg)   Assessment: 88 yo M on warfarin PTA for Afib. Pharmacy to dose while in patient. Admit INR 1.5 - below goal  Today, 12/26/2021 INR 2.1 - therapeutic CBC on 11/13: Hg 11.5- stable, PLT 147 - remains low, trended up No bleeding reported No drug-drug interactions noted  Goal of Therapy:  INR 2-3 Monitor platelets by anticoagulation protocol: Yes   Plan:  Warfarin 2.5 mg po x 1 today as per home regimen  Daily INR CBC in AM   Royetta Asal, PharmD, BCPS 12/26/2021 10:14 AM

## 2021-12-26 NOTE — NC FL2 (Signed)
[place] LEVEL OF CARE SCREENING TOOL     IDENTIFICATION  Patient Name: Tyler Williams Birthdate: 1926-11-13 Sex: male Admission Date (Current Location): 12/22/2021  [facility] and [place] Number:  Herbalist and Address:  [facility],  [place] [address] [place]      Provider Number: [id number]  Attending Physician Name and Address:  Lorella Nimrod, MD  Relative Name and Phone Number:   [name] [place](son) [phone])    Current Level of Care: Hospital Recommended Level of Care: [place] Prior Approval Number:    Date Approved/Denied:   PASRR Number:    Discharge Plan: Other (Comment) ([facility] [place])    Current Diagnoses: Patient Active Problem List   Diagnosis Date Noted   Acute on chronic systolic (congestive) heart failure ([place]) 12/22/2021   UTI (urinary tract infection) 12/22/2021   Chest pain 11/21/2021   Elevated troponin 11/21/2021   Normocytic anemia 11/21/2021   Chronic kidney disease, stage III (moderate) ([place]) 11/21/2021   Thrombocytopenia ([place]) 11/21/2021   BPH (benign prostatic hyperplasia) 11/21/2021   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) 10/05/2021   Hypotension 10/05/2021   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   Pain of left hand 08/12/2020   Abrasion of unspecified back wall of thorax, initial encounter 07/23/2020   Bite of nonvenomous arthropod 07/23/2020   Fall 07/23/2020   Hearing loss 07/23/2020   Laceration without foreign body of right elbow, initial encounter 07/23/2020   Influenza A    Gross hematuria    DCM (dilated cardiomyopathy) ([place])    Chronic atrial fibrillation (HCC)    Congestive heart failure ([place]) 06/12/2020   Dyspnea 04/24/2019   Chronic obstructive pulmonary disease ([place]) 06/10/2018   Hardening of the aorta (main artery of the heart) ([place]) 06/10/2018   Chronic kidney disease, stage 2 (mild) 04/24/2018   Hypertensive heart  and chronic kidney disease with heart failure and stage 1 through stage 4 chronic kidney disease, or unspecified chronic kidney disease ([place]) 04/24/2018   Aorta aneurysm ([place]) 02/14/2018   Cirrhosis of liver ([place]) 02/08/2018   Constipation 01/15/2018   Acute on chronic combined systolic and diastolic CHF (congestive heart failure) ([place]) 09/25/2017   Precordial chest pain 08/28/2017   HLD (hyperlipidemia) 08/28/2017   Anticoagulated on Coumadin 08/28/2017   HTN (hypertension) 08/28/2017   S/P TAVR (transcatheter aortic valve replacement)    Presence of prosthetic heart valve 09/06/2016   S/P TAVR (transcatheter aortic valve replacement) 06/13/2016   Incidental cecal mass noted on CT imaging 05/24/2016   Inguinal hernia 02/24/2016   Exudative age-related macular 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 ([place]) 12/22/2014   Non-thrombocytopenic purpura ([place]) 08/19/2014   Encounter for therapeutic drug monitoring 03/20/2013   Pseudophakia of both eyes 03/19/2013   Macular degeneration 03/19/2013   Cholecystitis, acute 12/06/2012   Chest pain, atypical 12/03/2012   Hematuria, microscopic 12/03/2012   Anxiety disorder 06/20/2012   Long term (current) use of anticoagulants 06/20/2012   Cystoid macular edema 05/14/2012   Overweight (BMI 25.0-29.9) 01/23/2012   Hyponatremia 01/23/2012   S/P right UKR 01/22/2012   Carotid artery bruit 11/21/2011   Carotid bruit 11/21/2011   Presence of intraocular lens 02/16/2011   Osteoarthritis 07/05/2010   EDEMA 07/14/2009   Essential hypertension 01/27/2009   HYPERCHOLESTEROLEMIA  IIA 10/21/2008   CAD (coronary artery disease) 10/21/2008   Permanent atrial fibrillation ([place]) 10/21/2008    Orientation RESPIRATION BLADDER Height &  Weight        Normal Continent Weight: 75.8 kg Height:  '5\' 8"'$  (172.7 cm)  BEHAVIORAL SYMPTOMS/MOOD NEUROLOGICAL BOWEL NUTRITION STATUS      Continent Diet (Regular)  AMBULATORY STATUS  COMMUNICATION OF NEEDS Skin   Limited Assist Verbally Normal                       Personal Care Assistance Level of Assistance              Functional Limitations Info  Sight, Hearing, Speech Sight Info: Impaired (eyeglasses) Hearing Info: Adequate Speech Info: Adequate    SPECIAL CARE FACTORS FREQUENCY                       Contractures Contractures Info: Not present    Additional Factors Info  Code Status (Full) Code Status Info:  (DNR)             Current Medications (12/26/2021):  This is the current hospital active medication list Current Facility-Administered Medications  Medication Dose Route Frequency Provider Last Rate Last Admin   acetaminophen (TYLENOL) tablet 650 mg  650 mg Oral Q6H PRN Marylyn Ishihara, Tyrone A, DO       Or   acetaminophen (TYLENOL) suppository 650 mg  650 mg Rectal Q6H PRN Marylyn Ishihara, Tyrone A, DO       atorvastatin (LIPITOR) tablet 20 mg  20 mg Oral Daily Kyle, Tyrone A, DO   20 mg at 12/26/21 1122   [START ON 12/27/2021] cefadroxil (DURICEF) capsule 1,000 mg  1,000 mg Oral BID Lorella Nimrod, MD       fesoterodine (TOVIAZ) tablet 8 mg  8 mg Oral Daily Lorella Nimrod, MD   8 mg at 12/26/21 1114   finasteride (PROSCAR) tablet 5 mg  5 mg Oral Daily Lorella Nimrod, MD   5 mg at 12/26/21 1123   furosemide (LASIX) injection 40 mg  40 mg Intravenous Daily Kyle, Tyrone A, DO   40 mg at 12/26/21 1122   isosorbide mononitrate (IMDUR) 24 hr tablet 60 mg  60 mg Oral Daily Lorella Nimrod, MD   60 mg at 12/26/21 1123   LORazepam (ATIVAN) tablet 1 mg  1 mg Oral BID PRN Cherylann Ratel A, DO   1 mg at 12/25/21 0017   metoprolol succinate (TOPROL-XL) 24 hr tablet 12.5 mg  12.5 mg Oral Daily Dibia, Manfred Shirts, MD   12.5 mg at 12/26/21 1122   ondansetron (ZOFRAN) tablet 4 mg  4 mg Oral Q6H PRN Marylyn Ishihara, Tyrone A, DO       Or   ondansetron (ZOFRAN) injection 4 mg  4 mg Intravenous Q6H PRN Marylyn Ishihara, Tyrone A, DO       Oral care mouth rinse  15 mL Mouth Rinse PRN Lorella Nimrod, MD       sacubitril-valsartan (ENTRESTO) 24-26 mg per tablet  1 tablet Oral BID Dibia, Manfred Shirts, MD   1 tablet at 12/26/21 1122   spironolactone (ALDACTONE) tablet 25 mg  25 mg Oral Daily Lorella Nimrod, MD   25 mg at 12/26/21 1122   tamsulosin (FLOMAX) capsule 0.4 mg  0.4 mg Oral Daily Lorella Nimrod, MD   0.4 mg at 12/26/21 1122   warfarin (COUMADIN) tablet 2.5 mg  2.5 mg Oral ONCE-1600 Lorella Nimrod, MD       Warfarin - Pharmacist Dosing Inpatient   Does not apply q1600 Eudelia Bunch, [facility]  Discharge Medications: Please see discharge summary for a list of discharge medications.  Relevant Imaging Results:  Relevant Lab Results:   Additional Information SSN# [id number]  Dessa Phi, RN

## 2021-12-26 NOTE — TOC Transition Note (Signed)
Transition of Care [facility]) - CM/SW Discharge Note   Patient Details  Name: Tyler Williams MRN: [id number] Date of Birth: 13-Feb-1927  [facility] CM/SW Contact:  Dessa Phi, RN Phone Number: 12/26/2021, 3:09 PM   Clinical Narrative: Returning back to [facility]-rep [facility] accepted-going to rm#[id number],report tel#[phone].PTAR called. No further CM needs.      Final next level of care: Assisted Living Barriers to Discharge: No Barriers Identified   Patient Goals and CMS Choice Patient states their goals for this hospitalization and ongoing recovery are:: To return to [facility] CMS Medicare.gov Compare Post Acute Care list provided to:: Patient Choice offered to / list presented to : Patient, Adult Children  Discharge Placement                       Discharge Plan and Services In-house Referral: NA Discharge Planning Services: CM Consult Post Acute Care Choice: Nursing Home          DME Arranged: N/A DME Agency: NA                  Social Determinants of Health (SDOH) Interventions     Readmission Risk Interventions    12/24/2021    3:52 PM  Readmission Risk Prevention Plan  Transportation Screening Complete  PCP or Specialist Appt within 3-5 Days Complete  HRI or [place] Complete  Social Work Consult for [place] Planning/Counseling Complete  Palliative Care Screening Not Applicable  Medication Review Press [employer]) Complete

## 2021-12-26 NOTE — NC FL2 (Signed)
[place] LEVEL OF CARE SCREENING TOOL     IDENTIFICATION  Patient Name: Tyler Williams Birthdate: 01-21-1927 Sex: male Admission Date (Current Location): 12/22/2021  [facility] and [place] Number:  Herbalist and Address:  [facility],  [place] [address], [place]      Provider Number: [id number]  Attending Physician Name and Address:  Lorella Nimrod, MD  Relative Name and Phone Number:   [name] Colon(son) [phone])    Current Level of Care: Hospital Recommended Level of Care: [place] Prior Approval Number:    Date Approved/Denied:   PASRR Number:    Discharge Plan: Other (Comment) ([facility] [place])    Current Diagnoses: Patient Active Problem List   Diagnosis Date Noted   Acute on chronic systolic (congestive) heart failure (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) 10/05/2021   Hypotension 10/05/2021   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   Pain of left hand 08/12/2020   Abrasion of unspecified back wall of thorax, initial encounter 07/23/2020   Bite of nonvenomous arthropod 07/23/2020   Fall 07/23/2020   Hearing loss 07/23/2020   Laceration without foreign body of right elbow, initial encounter 07/23/2020   Influenza A    Gross hematuria    DCM (dilated cardiomyopathy) ([place])    Chronic atrial fibrillation (HCC)    Congestive heart failure ([place]) 06/12/2020   Dyspnea 04/24/2019   Chronic obstructive pulmonary disease ([place]) 06/10/2018   Hardening of the aorta (main artery of the heart) ([place]) 06/10/2018   Chronic kidney disease, stage 2 (mild) 04/24/2018   Hypertensive heart  and chronic kidney disease with heart failure and stage 1 through stage 4 chronic kidney disease, or unspecified chronic kidney disease ([place]) 04/24/2018   Aorta aneurysm ([place]) 02/14/2018   Cirrhosis of liver ([place]) 02/08/2018   Constipation 01/15/2018   Acute on chronic combined systolic and diastolic CHF (congestive heart failure) ([place]) 09/25/2017   Precordial chest pain 08/28/2017   HLD (hyperlipidemia) 08/28/2017   Anticoagulated on Coumadin 08/28/2017   HTN (hypertension) 08/28/2017   S/P TAVR (transcatheter aortic valve replacement)    Presence of prosthetic heart valve 09/06/2016   S/P TAVR (transcatheter aortic valve replacement) 06/13/2016   Incidental cecal mass noted on CT imaging 05/24/2016   Inguinal hernia 02/24/2016   Exudative age-related macular 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 ([place]) 12/22/2014   Non-thrombocytopenic purpura ([place]) 08/19/2014   Encounter for therapeutic drug monitoring 03/20/2013   Pseudophakia of both eyes 03/19/2013   Macular degeneration 03/19/2013   Cholecystitis, acute 12/06/2012   Chest pain, atypical 12/03/2012   Hematuria, microscopic 12/03/2012   Anxiety disorder 06/20/2012   Long term (current) use of anticoagulants 06/20/2012   Cystoid macular edema 05/14/2012   Overweight (BMI 25.0-29.9) 01/23/2012   Hyponatremia 01/23/2012   S/P right UKR 01/22/2012   Carotid artery bruit 11/21/2011   Carotid bruit 11/21/2011   Presence of intraocular lens 02/16/2011   Osteoarthritis 07/05/2010   EDEMA 07/14/2009   Essential hypertension 01/27/2009   HYPERCHOLESTEROLEMIA  IIA 10/21/2008   CAD (coronary artery disease) 10/21/2008   Permanent atrial fibrillation ([place]) 10/21/2008    Orientation RESPIRATION BLADDER Height &  Weight        Normal Continent Weight: 75.8 kg Height:  '5\' 8"'$  (172.7 cm)  BEHAVIORAL SYMPTOMS/MOOD NEUROLOGICAL BOWEL NUTRITION STATUS      Continent Diet (Heart Healthy)  AMBULATORY STATUS  COMMUNICATION OF NEEDS Skin   Limited Assist Verbally Normal                       Personal Care Assistance Level of Assistance              Functional Limitations Info  Sight, Hearing, Speech Sight Info: Impaired (eyeglasses) Hearing Info: Adequate Speech Info: Adequate    SPECIAL CARE FACTORS FREQUENCY                       Contractures Contractures Info: Not present    Additional Factors Info  Code Status (Full)               Current Medications (12/26/2021):  This is the current hospital active medication list Current Facility-Administered Medications  Medication Dose Route Frequency Provider Last Rate Last Admin   acetaminophen (TYLENOL) tablet 650 mg  650 mg Oral Q6H PRN Marylyn Ishihara, Tyrone A, DO       Or   acetaminophen (TYLENOL) suppository 650 mg  650 mg Rectal Q6H PRN Marylyn Ishihara, Tyrone A, DO       atorvastatin (LIPITOR) tablet 20 mg  20 mg Oral Daily Kyle, Tyrone A, DO   20 mg at 12/26/21 1122   [START ON 12/27/2021] cefadroxil (DURICEF) capsule 1,000 mg  1,000 mg Oral BID Lorella Nimrod, MD       fesoterodine (TOVIAZ) tablet 8 mg  8 mg Oral Daily Lorella Nimrod, MD   8 mg at 12/26/21 1114   finasteride (PROSCAR) tablet 5 mg  5 mg Oral Daily Lorella Nimrod, MD   5 mg at 12/26/21 1123   furosemide (LASIX) injection 40 mg  40 mg Intravenous Daily Marylyn Ishihara, Tyrone A, DO   40 mg at 12/26/21 1122   isosorbide mononitrate (IMDUR) 24 hr tablet 60 mg  60 mg Oral Daily Lorella Nimrod, MD   60 mg at 12/26/21 1123   LORazepam (ATIVAN) tablet 1 mg  1 mg Oral BID PRN Cherylann Ratel A, DO   1 mg at 12/25/21 0017   metoprolol succinate (TOPROL-XL) 24 hr tablet 12.5 mg  12.5 mg Oral Daily Dibia, Manfred Shirts, MD   12.5 mg at 12/26/21 1122   ondansetron (ZOFRAN) tablet 4 mg  4 mg Oral Q6H PRN Marylyn Ishihara, Tyrone A, DO       Or   ondansetron (ZOFRAN) injection 4 mg  4 mg Intravenous Q6H PRN Marylyn Ishihara, Tyrone A, DO       Oral care mouth rinse  15 mL Mouth Rinse PRN Lorella Nimrod, MD        polyethylene glycol (MIRALAX / GLYCOLAX) packet 17 g  17 g Oral Once Lorella Nimrod, MD       sacubitril-valsartan (ENTRESTO) 24-26 mg per tablet  1 tablet Oral BID Dibia, Manfred Shirts, MD   1 tablet at 12/26/21 1122   spironolactone (ALDACTONE) tablet 25 mg  25 mg Oral Daily Lorella Nimrod, MD   25 mg at 12/26/21 1122   tamsulosin (FLOMAX) capsule 0.4 mg  0.4 mg Oral Daily Lorella Nimrod, MD   0.4 mg at 12/26/21 1122   warfarin (COUMADIN) tablet 2.5 mg  2.5 mg Oral ONCE-1600 Lorella Nimrod, MD       Warfarin -  Pharmacist Dosing Inpatient   Does not apply q1600 Eudelia Bunch, [facility]         Discharge Medications: Please see discharge summary for a list of discharge medications.  Relevant Imaging Results:  Relevant Lab Results:   Additional Information SSN# [id number]  Dessa Phi, RN

## 2021-12-26 NOTE — NC FL2 (Signed)
[place] LEVEL OF CARE SCREENING TOOL     IDENTIFICATION  Patient Name: Tyler Williams Birthdate: 1926/04/06 Sex: male Admission Date (Current Location): 12/22/2021  [facility] and [place] Number:  Herbalist and Address:  [facility],  [place] [address] [place]      Provider Number: [id number]  Attending Physician Name and Address:  Lorella Nimrod, MD  Relative Name and Phone Number:   [name] Pata(son) [phone])    Current Level of Care: Hospital Recommended Level of Care: [place] Prior Approval Number:    Date Approved/Denied:   PASRR Number:    Discharge Plan: Other (Comment) ([facility] [place])    Current Diagnoses: Patient Active Problem List   Diagnosis Date Noted   Acute on chronic systolic (congestive) heart failure ([place]) 12/22/2021   UTI (urinary tract infection) 12/22/2021   Chest pain 11/21/2021   Elevated troponin 11/21/2021   Normocytic anemia 11/21/2021   Chronic kidney disease, stage III (moderate) ([place]) 11/21/2021   Thrombocytopenia ([place]) 11/21/2021   BPH (benign prostatic hyperplasia) 11/21/2021   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) 10/05/2021   Hypotension 10/05/2021   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   Pain of left hand 08/12/2020   Abrasion of unspecified back wall of thorax, initial encounter 07/23/2020   Bite of nonvenomous arthropod 07/23/2020   Fall 07/23/2020   Hearing loss 07/23/2020   Laceration without foreign body of right elbow, initial encounter 07/23/2020   Influenza A    Gross hematuria    DCM (dilated cardiomyopathy) ([place])    Chronic atrial fibrillation (HCC)    Congestive heart failure ([place]) 06/12/2020   Dyspnea 04/24/2019   Chronic obstructive pulmonary disease ([place]) 06/10/2018   Hardening of the aorta (main artery of the heart) ([place]) 06/10/2018   Chronic kidney disease, stage 2 (mild) 04/24/2018   Hypertensive heart  and chronic kidney disease with heart failure and stage 1 through stage 4 chronic kidney disease, or unspecified chronic kidney disease ([place]) 04/24/2018   Aorta aneurysm ([place]) 02/14/2018   Cirrhosis of liver ([place]) 02/08/2018   Constipation 01/15/2018   Acute on chronic combined systolic and diastolic CHF (congestive heart failure) ([place]) 09/25/2017   Precordial chest pain 08/28/2017   HLD (hyperlipidemia) 08/28/2017   Anticoagulated on Coumadin 08/28/2017   HTN (hypertension) 08/28/2017   S/P TAVR (transcatheter aortic valve replacement)    Presence of prosthetic heart valve 09/06/2016   S/P TAVR (transcatheter aortic valve replacement) 06/13/2016   Incidental cecal mass noted on CT imaging 05/24/2016   Inguinal hernia 02/24/2016   Exudative age-related macular 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 ([place]) 12/22/2014   Non-thrombocytopenic purpura ([place]) 08/19/2014   Encounter for therapeutic drug monitoring 03/20/2013   Pseudophakia of both eyes 03/19/2013   Macular degeneration 03/19/2013   Cholecystitis, acute 12/06/2012   Chest pain, atypical 12/03/2012   Hematuria, microscopic 12/03/2012   Anxiety disorder 06/20/2012   Long term (current) use of anticoagulants 06/20/2012   Cystoid macular edema 05/14/2012   Overweight (BMI 25.0-29.9) 01/23/2012   Hyponatremia 01/23/2012   S/P right UKR 01/22/2012   Carotid artery bruit 11/21/2011   Carotid bruit 11/21/2011   Presence of intraocular lens 02/16/2011   Osteoarthritis 07/05/2010   EDEMA 07/14/2009   Essential hypertension 01/27/2009   HYPERCHOLESTEROLEMIA  IIA 10/21/2008   CAD (coronary artery disease) 10/21/2008   Permanent atrial fibrillation ([place]) 10/21/2008    Orientation RESPIRATION BLADDER Height &  Weight        Normal Continent Weight: 75.8 kg Height:  '5\' 8"'$  (172.7 cm)  BEHAVIORAL SYMPTOMS/MOOD NEUROLOGICAL BOWEL NUTRITION STATUS      Continent Diet (Regular)  AMBULATORY STATUS  COMMUNICATION OF NEEDS Skin   Limited Assist Verbally Normal                       Personal Care Assistance Level of Assistance              Functional Limitations Info  Sight, Hearing, Speech Sight Info: Impaired (eyeglasses) Hearing Info: Adequate Speech Info: Adequate    SPECIAL CARE FACTORS FREQUENCY                       Contractures Contractures Info: Not present    Additional Factors Info  Code Status (Full)               Current Medications (12/26/2021):  This is the current hospital active medication list Current Facility-Administered Medications  Medication Dose Route Frequency Provider Last Rate Last Admin   acetaminophen (TYLENOL) tablet 650 mg  650 mg Oral Q6H PRN Marylyn Ishihara, Tyrone A, DO       Or   acetaminophen (TYLENOL) suppository 650 mg  650 mg Rectal Q6H PRN Marylyn Ishihara, Tyrone A, DO       atorvastatin (LIPITOR) tablet 20 mg  20 mg Oral Daily Kyle, Tyrone A, DO   20 mg at 12/26/21 1122   [START ON 12/27/2021] cefadroxil (DURICEF) capsule 1,000 mg  1,000 mg Oral BID Lorella Nimrod, MD       fesoterodine (TOVIAZ) tablet 8 mg  8 mg Oral Daily Lorella Nimrod, MD   8 mg at 12/26/21 1114   finasteride (PROSCAR) tablet 5 mg  5 mg Oral Daily Lorella Nimrod, MD   5 mg at 12/26/21 1123   furosemide (LASIX) injection 40 mg  40 mg Intravenous Daily Marylyn Ishihara, Tyrone A, DO   40 mg at 12/26/21 1122   isosorbide mononitrate (IMDUR) 24 hr tablet 60 mg  60 mg Oral Daily Lorella Nimrod, MD   60 mg at 12/26/21 1123   LORazepam (ATIVAN) tablet 1 mg  1 mg Oral BID PRN Cherylann Ratel A, DO   1 mg at 12/25/21 0017   metoprolol succinate (TOPROL-XL) 24 hr tablet 12.5 mg  12.5 mg Oral Daily Dibia, Manfred Shirts, MD   12.5 mg at 12/26/21 1122   ondansetron (ZOFRAN) tablet 4 mg  4 mg Oral Q6H PRN Marylyn Ishihara, Tyrone A, DO       Or   ondansetron (ZOFRAN) injection 4 mg  4 mg Intravenous Q6H PRN Marylyn Ishihara, Tyrone A, DO       Oral care mouth rinse  15 mL Mouth Rinse PRN Lorella Nimrod, MD        polyethylene glycol (MIRALAX / GLYCOLAX) packet 17 g  17 g Oral Once Lorella Nimrod, MD       sacubitril-valsartan (ENTRESTO) 24-26 mg per tablet  1 tablet Oral BID Dibia, Manfred Shirts, MD   1 tablet at 12/26/21 1122   spironolactone (ALDACTONE) tablet 25 mg  25 mg Oral Daily Lorella Nimrod, MD   25 mg at 12/26/21 1122   tamsulosin (FLOMAX) capsule 0.4 mg  0.4 mg Oral Daily Lorella Nimrod, MD   0.4 mg at 12/26/21 1122   warfarin (COUMADIN) tablet 2.5 mg  2.5 mg Oral Victorio Palm, MD       Warfarin - Pharmacist  Dosing Inpatient   Does not apply q1600 Eudelia Bunch, [facility]         Discharge Medications: Please see discharge summary for a list of discharge medications.  Relevant Imaging Results:  Relevant Lab Results:   Additional Information SSN# [id number]  Dessa Phi, RN

## 2021-12-26 NOTE — Discharge Summary (Addendum)
Physician Discharge Summary   Patient: Tyler Williams MRN: [id number] DOB: Dec 05, 1926  Admit date:     12/22/2021  Discharge date: 12/26/21  Discharge Physician: Lorella Nimrod   PCP: Burnard Bunting, MD   Recommendations at discharge:  Please obtain CBC and BMP in 1 week Follow-up final repeat blood cultures, negative in 1 day. Follow-up with primary care provider Please ensure the completion of antibiotics for E. coli UTI and bacteremia.  Discharge Diagnoses: Principal Problem:   Acute on chronic systolic (congestive) heart failure (HCC) Active Problems:   CAD (coronary artery disease)   Permanent atrial fibrillation (HCC)   Essential hypertension   HLD (hyperlipidemia)   Anxiety disorder   BPH (benign prostatic hyperplasia)   UTI (urinary tract infection)   Hospital Course: Tyler Williams is a 88 y.o. male with medical history significant of HLD, HTN, a fib, HFrEF, CAD, AAA s/p repair, aortic stenosis s/p TAVR, GERD. Presenting with shortness of breath. He was in his normal state of health until about 3 days ago. He noticed then that he was more fatigued and short of breath. It was harder for him to get around to do activities. He didn't have any cough. He didn't have any chest pain or leg swelling. He didn't have any palpitations. He noticed some wheeze last night, but he seemed to feel better if he propped his head up with pillows. This morning he noticed that he was having chills and it was even more difficult to breathe. He became concerned and called for EMS. He denies any other aggravating or alleviating factors.    11/11: Hemodynamically stable, on 2 L of oxygen, echocardiogram with EF of 45 to 50%, pretty much no acute changes as compared to prior echo. UOP recorded of 250 cc only.  Wt decreased to 166 pounds from 173 pounds. Blood and urine cultures both growing E. coli-pending susceptibility.   11/12: Recorded output of 2290, net negative of -1469, weight 167  pound.  Renal function stable.  INR 2.0.  Repeat blood cultures pending.  11/13: Repeat blood cultures remain negative in 24 hours.  Continue to diurese well with stable renal function.  Lower extremity edema improved. Patient is being transitioned to cefadroxil for 6 more days to complete a 10-day course of antibiotics for concern of UTI with bacteremia.  He will continue with the rest of his home medications and need to have a close follow-up with his providers for further recommendations.   Consultants: None Procedures performed: None Disposition: Assisted living Diet recommendation:  Discharge Diet Orders (From admission, onward)     Start     Ordered   12/26/21 0000  Diet - low sodium heart healthy        12/26/21 1104           Cardiac diet DISCHARGE MEDICATION: Allergies as of 12/26/2021   No Known Allergies      Medication List     TAKE these medications    acetaminophen 500 MG tablet Commonly known as: TYLENOL Take 500 mg by mouth every 4 (four) hours as needed for moderate pain.   atorvastatin 20 MG tablet Commonly known as: LIPITOR TAKE 1 TABLET ONCE DAILY.   CAL-MAG-ZINC PO Take 1 tablet by mouth every morning.   cefadroxil 500 MG capsule Commonly known as: DURICEF Take 2 capsules (1,000 mg total) by mouth 2 (two) times daily for 6 days. Start taking on: December 27, 2021   Entresto 24-26 MG Generic drug: sacubitril-valsartan Take 1  tablet by mouth 2 (two) times daily. Please keep upcoming appointment for future refills thank you.   fesoterodine 8 MG Tb24 tablet Commonly known as: TOVIAZ Take 8 mg by mouth daily.   finasteride 5 MG tablet Commonly known as: PROSCAR Take 1 tablet (5 mg total) by mouth daily.   furosemide 40 MG tablet Commonly known as: LASIX Take 1.5 tablets (60 mg total) by mouth daily. What changed: additional instructions   isosorbide mononitrate 60 MG 24 hr tablet Commonly known as: IMDUR Take 1 tablet (60 mg total)  by mouth daily.   LORazepam 1 MG tablet Commonly known as: ATIVAN Take 1 mg by mouth 2 (two) times daily as needed for anxiety.   metoprolol succinate 25 MG 24 hr tablet Commonly known as: Toprol XL Take 0.5 tablets (12.5 mg total) by mouth daily.   nitroGLYCERIN 0.4 MG SL tablet Commonly known as: NITROSTAT Place 0.4 mg under the tongue every 5 (five) minutes as needed for chest pain.   nystatin cream Commonly known as: MYCOSTATIN Apply 1 Application topically 2 (two) times daily.   polyethylene glycol 17 g packet Commonly known as: MIRALAX / GLYCOLAX Take 17 g by mouth daily as needed.   Polyethylene Glycol 3350 Powd Take 17 g by mouth daily.   PreserVision AREDS Tabs Take 1 tablet by mouth daily.   spironolactone 25 MG tablet Commonly known as: ALDACTONE Take 1 tablet (25 mg total) by mouth daily.   tamsulosin 0.4 MG Caps capsule Commonly known as: FLOMAX Take 1 capsule (0.4 mg total) by mouth daily.   Vitamin D-3 125 MCG (5000 UT) Tabs Take 5,000 Units by mouth every morning.   warfarin 2.5 MG tablet Commonly known as: COUMADIN Take 1.25 mg by mouth See admin instructions. Take 1.25 mg by mouth on Saturday and 'Sunday.   warfarin 2.5 MG tablet Commonly known as: COUMADIN Take 2.5 mg by mouth See admin instructions. Take 2.5 mg by mouth daily on Monday, Tuesday, Wednesday, Thursday, and Friday.        Follow-up Information     Aronson, Richard, MD. Schedule an appointment as soon as possible for a visit in 1 week(s).   Specialty: Internal Medicine Contact information: [address] [place] [address] [phone]                Discharge Exam: Filed Weights   12/23/21 1700 12/24/21 0500 12/25/21 0500  Weight: 78.7 kg 75.5 kg 75.8 kg   General.  Frail elderly man, in no acute distress. Pulmonary.  Lungs clear bilaterally, normal respiratory effort. CV.  Regular rate and rhythm, no JVD, rub or murmur. Abdomen.  Soft, nontender,  nondistended, BS positive. CNS.  Alert and oriented .  No focal neurologic deficit. Extremities.  No edema, no cyanosis, pulses intact and symmetrical.  Sign of chronic venous congestion. Psychiatry.  Judgment and insight appears normal.   Condition at discharge: stable  The results of significant diagnostics from this hospitalization (including imaging, microbiology, ancillary and laboratory) are listed below for reference.   Imaging Studies: ECHOCARDIOGRAM COMPLETE  Result Date: 12/23/2021    ECHOCARDIOGRAM REPORT   Patient Name:   Korban JACKSON Wempe Date of Exam: 12/23/2021 Medical Rec #:  [id number]             Height:       68.0 in Accession #:    [id number]            Weight:       17'$ 6.0 lb Date  of Birth:  [birth year]              BSA:          1.936 m Patient Age:    51 years              BP:           119/49 mmHg Patient Gender: M                     HR:           59 bpm. Exam Location:  Inpatient Procedure: 2D Echo, Cardiac Doppler and Color Doppler Indications:    CHF-Acute Systolic T55.73  History:        Patient has prior history of Echocardiogram examinations, most                 recent 07/06/2021. CAD, Pulmonary HTN and Abdominal Aortic                 Aneurysm, Arrythmias:Atrial Fibrillation and RBBB; Risk                 Factors:GERD and Hypertension.                 Aortic Valve: 29 mm Sapien prosthetic, stented (TAVR) valve is                 present in the aortic position. Procedure Date: 06/13/16.  Sonographer:    Bernadene Person RDCS Referring Phys: [id number] [place]  1. Left ventricular ejection fraction, by estimation, is 45 to 50%. Left ventricular ejection fraction by 2D MOD biplane is 47.6 %. The left ventricle has mildly decreased function. The left ventricle demonstrates global hypokinesis. Left ventricular diastolic function could not be evaluated. Elevated left ventricular end-diastolic pressure.  2. Right ventricular systolic function moderate to severely  reduced. The right ventricular size is mildly enlarged. There is moderately elevated pulmonary artery systolic pressure. The estimated right ventricular systolic pressure is 70.6 mmHg.  3. Left atrial size was severely dilated.  4. Right atrial size was severely dilated.  5. There is no evidence of cardiac tamponade.  6. The mitral valve is abnormal. Moderate mitral valve regurgitation.  7. Tricuspid valve regurgitation is mild to moderate.  8. The aortic valve has been repaired/replaced. Aortic valve regurgitation is trivial. There is a 29 mm Sapien prosthetic (TAVR) valve present in the aortic position. Procedure Date: 06/13/16. Echo findings are consistent with normal structure and function of the aortic valve prosthesis. Aortic regurgitation PHT measures 512 msec. Aortic valve mean gradient measures 3.7 mmHg.  9. The inferior vena cava is dilated in size with <50% respiratory variability, suggesting right atrial pressure of 15 mmHg. Comparison(s): Changes from prior study are noted. 07/06/2021: LVEF 45-50%, mean TAVR gradient 6 mmHg. FINDINGS  Left Ventricle: Left ventricular ejection fraction, by estimation, is 45 to 50%. Left ventricular ejection fraction by 2D MOD biplane is 47.6 %. The left ventricle has mildly decreased function. The left ventricle demonstrates global hypokinesis. The left ventricular internal cavity size was normal in size. There is no left ventricular hypertrophy. Left ventricular diastolic function could not be evaluated due to atrial fibrillation. Left ventricular diastolic function could not be evaluated. Elevated left ventricular end-diastolic pressure. Right Ventricle: The right ventricular size is mildly enlarged. No increase in right ventricular wall thickness. Right ventricular systolic function moderate to severely reduced. There is moderately elevated pulmonary artery systolic pressure. The tricuspid regurgitant  velocity is 3.06 m/s, and with an assumed right atrial pressure of 15  mmHg, the estimated right ventricular systolic pressure is 02.5 mmHg. Left Atrium: Left atrial size was severely dilated. Right Atrium: Right atrial size was severely dilated. Pericardium: Trivial pericardial effusion is present. The pericardial effusion is posterior to the left ventricle. There is no evidence of cardiac tamponade. Mitral Valve: The mitral valve is abnormal. Mild to moderate mitral annular calcification. Moderate mitral valve regurgitation, with posteriorly-directed jet. Tricuspid Valve: The tricuspid valve is grossly normal. Tricuspid valve regurgitation is mild to moderate. Aortic Valve: The aortic valve has been repaired/replaced. Aortic valve regurgitation is trivial. Aortic regurgitation PHT measures 512 msec. Aortic valve mean gradient measures 3.7 mmHg. Aortic valve peak gradient measures 7.3 mmHg. Aortic valve area, by VTI measures 4.29 cm. There is a 29 mm Sapien prosthetic, stented (TAVR) valve present in the aortic position. Procedure Date: 06/13/16. Echo findings are consistent with normal structure and function of the aortic valve prosthesis. Pulmonic Valve: The pulmonic valve was grossly normal. Pulmonic valve regurgitation is trivial. Aorta: The aortic root and ascending aorta are structurally normal, with no evidence of dilitation. Venous: The inferior vena cava is dilated in size with less than 50% respiratory variability, suggesting right atrial pressure of 15 mmHg. IAS/Shunts: No atrial level shunt detected by color flow Doppler. Additional Comments: There is pleural effusion in the left lateral region.  LEFT VENTRICLE PLAX 2D                        Biplane EF (MOD) LVIDd:         5.20 cm         LV Biplane EF:   Left LVIDs:         3.90 cm                          ventricular LV PW:         0.80 cm                          ejection LV IVS:        0.80 cm                          fraction by LVOT diam:     2.70 cm                          2D MOD LV SV:         123                               biplane is LV SV Index:   63                               47.6 %. LVOT Area:     5.73 cm                                Diastology                                LV e' medial:  3.66 cm/s LV Volumes (MOD)               LV E/e' medial:  41.0 LV vol d, MOD    139.0 ml      LV e' lateral:   5.46 cm/s A2C:                           LV E/e' lateral: 27.5 LV vol d, MOD    117.0 ml A4C: LV vol s, MOD    78.6 ml A2C: LV vol s, MOD    56.1 ml A4C: LV SV MOD A2C:   60.4 ml LV SV MOD A4C:   117.0 ml LV SV MOD BP:    60.9 ml RIGHT VENTRICLE RV S prime:     4.78 cm/s TAPSE (M-mode): 0.9 cm LEFT ATRIUM              Index        RIGHT ATRIUM           Index LA diam:        5.60 cm  2.89 cm/m   RA Area:     38.10 cm LA Vol (A2C):   129.0 ml 66.64 ml/m  RA Volume:   154.00 ml 79.56 ml/m LA Vol (A4C):   126.0 ml 65.09 ml/m LA Biplane Vol: 131.0 ml 67.68 ml/m  AORTIC VALVE                    PULMONIC VALVE AV Area (Vmax):    4.08 cm     PR End Diast Vel: 7.51 msec AV Area (Vmean):   4.23 cm AV Area (VTI):     4.29 cm AV Vmax:           135.33 cm/s AV Vmean:          85.333 cm/s AV VTI:            0.285 m AV Peak Grad:      7.3 mmHg AV Mean Grad:      3.7 mmHg LVOT Vmax:         96.50 cm/s LVOT Vmean:        63.100 cm/s LVOT VTI:          0.214 m LVOT/AV VTI ratio: 0.75 AI PHT:            512 msec  AORTA Ao Root diam: 3.30 cm Ao Asc diam:  3.40 cm MITRAL VALVE                  TRICUSPID VALVE MV Area (PHT): 5.27 cm       TR Peak grad:   37.5 mmHg MV Decel Time: 144 msec       TR Vmax:        306.00 cm/s MR Peak grad:    133.4 mmHg MR Mean grad:    89.5 mmHg    SHUNTS MR Vmax:         577.50 cm/s  Systemic VTI:  0.21 m MR Vmean:        454.0 cm/s   Systemic Diam: 2.70 cm MR PISA:         1.01 cm MR PISA Eff ROA: 7 mm MR PISA Radius:  0.40 cm MV E velocity: 150.00 cm/s MV A velocity: 46.00 cm/s MV E/A ratio:  3.26 Lyman Bishop MD Electronically signed by Lyman Bishop MD Signature Date/Time: 12/23/2021/11:29:01  AM  Final    DG Chest 2 View  Result Date: 12/22/2021 CLINICAL DATA:  Suspected sepsis, wheezing, low O2 sats. EXAM: CHEST - 2 VIEW COMPARISON:  12/22/2021. FINDINGS: Heart is enlarged and the mediastinal contour is stable. A TAVR stent is noted. Atherosclerotic calcification of the aorta is noted. The pulmonary vasculature is mildly distended. There are small bilateral pleural effusions with patchy airspace disease at the lung bases. No pneumothorax. No acute osseous abnormality. IMPRESSION: 1. Cardiomegaly with pulmonary vascular congestion. 2. Patchy airspace disease at the lung bases, possible edema or infiltrate. 3. Small bilateral pleural effusions. Electronically Signed   By: Brett Fairy M.D.   On: 12/22/2021 22:36   DG Chest Port 1 View  Result Date: 12/22/2021 CLINICAL DATA:  Questionable sepsis - evaluate for abnormality EXAM: PORTABLE CHEST 1 VIEW COMPARISON:  11/21/2021 FINDINGS: Cardiomegaly, vascular congestion. Diffuse interstitial prominence. Bilateral perihilar and lower lobe opacities, favor edema. Small bilateral effusions. Aortic atherosclerosis. No acute bony abnormality. IMPRESSION: Cardiomegaly with vascular congestion, interstitial prominence and perihilar/lower lobe opacities concerning for edema/CHF. Small layering effusions. Electronically Signed   By: Rolm Baptise M.D.   On: 12/22/2021 13:59    Microbiology: Results for orders placed or performed during the hospital encounter of 12/22/21  Culture, blood (Routine x 2)     Status: Abnormal   Collection Time: 12/22/21 12:18 PM   Specimen: BLOOD  Result Value Ref Range Status   Specimen Description   Final    BLOOD RIGHT ANTECUBITAL Performed at [facility] [address], [place], [place] [address]    Special Requests   Final    BOTTLES DRAWN AEROBIC AND ANAEROBIC Blood Culture adequate volume Performed at [facility] [address] [place], [place] [address]    Culture   Setup Time   Final    GRAM NEGATIVE RODS IN BOTH AEROBIC AND ANAEROBIC BOTTLES CRITICAL RESULT CALLED TO, READ BACK BY AND VERIFIED WITH: M LILLISTON,PHARMD'@0615'$  12/23/21 [place] Performed at [facility], [address], [place], [place] [address]    Culture ESCHERICHIA COLI (A)  Final   Report Status 12/25/2021 FINAL  Final   Organism ID, Bacteria ESCHERICHIA COLI  Final      Susceptibility   Escherichia coli - MIC*    AMPICILLIN <=2 SENSITIVE Sensitive     CEFAZOLIN <=4 SENSITIVE Sensitive     CEFEPIME <=0.12 SENSITIVE Sensitive     CEFTAZIDIME <=1 SENSITIVE Sensitive     CEFTRIAXONE <=0.25 SENSITIVE Sensitive     CIPROFLOXACIN >=4 RESISTANT Resistant     GENTAMICIN <=1 SENSITIVE Sensitive     IMIPENEM <=0.25 SENSITIVE Sensitive     TRIMETH/SULFA <=20 SENSITIVE Sensitive     AMPICILLIN/SULBACTAM <=2 SENSITIVE Sensitive     PIP/TAZO <=4 SENSITIVE Sensitive     * ESCHERICHIA COLI  Blood Culture ID Panel (Reflexed)     Status: Abnormal   Collection Time: 12/22/21 12:18 PM  Result Value Ref Range Status   Enterococcus faecalis NOT DETECTED NOT DETECTED Final   Enterococcus Faecium NOT DETECTED NOT DETECTED Final   Listeria monocytogenes NOT DETECTED NOT DETECTED Final   Staphylococcus species NOT DETECTED NOT DETECTED Final   Staphylococcus aureus (BCID) NOT DETECTED NOT DETECTED Final   Staphylococcus epidermidis NOT DETECTED NOT DETECTED Final   Staphylococcus lugdunensis NOT DETECTED NOT DETECTED Final   Streptococcus species NOT DETECTED NOT DETECTED Final   Streptococcus agalactiae NOT DETECTED NOT DETECTED Final   Streptococcus pneumoniae NOT DETECTED NOT DETECTED Final  Streptococcus pyogenes NOT DETECTED NOT DETECTED Final   A.calcoaceticus-baumannii NOT DETECTED NOT DETECTED Final   Bacteroides fragilis NOT DETECTED NOT DETECTED Final   Enterobacterales DETECTED (A) NOT DETECTED Final    Comment: Enterobacterales represent a large order of gram negative bacteria, not  a single organism. CRITICAL RESULT CALLED TO, READ BACK BY AND VERIFIED WITH: M LILLISTON,PHARMD'@0615'$  12/23/21 [place]    Enterobacter cloacae complex NOT DETECTED NOT DETECTED Final   Escherichia coli DETECTED (A) NOT DETECTED Final    Comment: CRITICAL RESULT CALLED TO, READ BACK BY AND VERIFIED WITH: M LILLISTON,PHARMD'@0615'$  12/23/21 [place]    Klebsiella aerogenes NOT DETECTED NOT DETECTED Final   Klebsiella oxytoca NOT DETECTED NOT DETECTED Final   Klebsiella pneumoniae NOT DETECTED NOT DETECTED Final   Proteus species NOT DETECTED NOT DETECTED Final   Salmonella species NOT DETECTED NOT DETECTED Final   Serratia marcescens NOT DETECTED NOT DETECTED Final   Haemophilus influenzae NOT DETECTED NOT DETECTED Final   Neisseria meningitidis NOT DETECTED NOT DETECTED Final   Pseudomonas aeruginosa NOT DETECTED NOT DETECTED Final   Stenotrophomonas maltophilia NOT DETECTED NOT DETECTED Final   Candida albicans NOT DETECTED NOT DETECTED Final   Candida auris NOT DETECTED NOT DETECTED Final   Candida glabrata NOT DETECTED NOT DETECTED Final   Candida krusei NOT DETECTED NOT DETECTED Final   Candida parapsilosis NOT DETECTED NOT DETECTED Final   Candida tropicalis NOT DETECTED NOT DETECTED Final   Cryptococcus neoformans/gattii NOT DETECTED NOT DETECTED Final   CTX-M ESBL NOT DETECTED NOT DETECTED Final   Carbapenem resistance IMP NOT DETECTED NOT DETECTED Final   Carbapenem resistance KPC NOT DETECTED NOT DETECTED Final   Carbapenem resistance NDM NOT DETECTED NOT DETECTED Final   Carbapenem resist OXA 48 LIKE NOT DETECTED NOT DETECTED Final   Carbapenem resistance VIM NOT DETECTED NOT DETECTED Final    Comment: Performed at [facility], [address] [place], [place] [address]  SARS Coronavirus 2 by RT PCR (hospital order, performed in [facility] hospital lab) *cepheid single result test* Anterior Nasal Swab     Status: None   Collection Time: 12/22/21  1:20 PM   Specimen: Anterior  Nasal Swab  Result Value Ref Range Status   SARS Coronavirus 2 by RT PCR NEGATIVE NEGATIVE Final    Comment: (NOTE) SARS-CoV-2 target nucleic acids are NOT DETECTED.  The SARS-CoV-2 RNA is generally detectable in upper and lower respiratory specimens during the acute phase of infection. The lowest concentration of SARS-CoV-2 viral copies this assay can detect is 250 copies / mL. A negative result does not preclude SARS-CoV-2 infection and should not be used as the sole basis for treatment or other patient management decisions.  A negative result may occur with improper specimen collection / handling, submission of specimen other than nasopharyngeal swab, presence of viral mutation(s) within the areas targeted by this assay, and inadequate number of viral copies (<250 copies / mL). A negative result must be combined with clinical observations, patient history, and epidemiological information.  Fact Sheet for Patients:   https://www.patel.info/  Fact Sheet for Healthcare Providers: https://hall.com/  This test is not yet approved or  cleared by the [place] FDA and has been authorized for detection and/or diagnosis of SARS-CoV-2 by FDA under an Emergency Use Authorization (EUA).  This EUA will remain in effect (meaning this test can be used) for the duration of the COVID-19 declaration under Section 564(b)(1) of the Act, 21 U.S.C. section 360bbb-3(b)(1), unless the authorization  is terminated or revoked sooner.  Performed at [facility], [place] [address] [place], [place] [address]   Culture, blood (Routine x 2)     Status: Abnormal   Collection Time: 12/22/21  1:50 PM   Specimen: BLOOD  Result Value Ref Range Status   Specimen Description   Final    BLOOD LEFT ANTECUBITAL Performed at [facility] [address], [place], [place] [address]    Special Requests   Final    BOTTLES DRAWN  AEROBIC AND ANAEROBIC Blood Culture adequate volume Performed at [facility] [address], [place], [place] [address]    Culture  Setup Time   Final    GRAM NEGATIVE RODS IN BOTH AEROBIC AND ANAEROBIC BOTTLES CRITICAL VALUE NOTED.  VALUE IS CONSISTENT WITH PREVIOUSLY REPORTED AND CALLED VALUE.    Culture (A)  Final    ESCHERICHIA COLI SUSCEPTIBILITIES PERFORMED ON PREVIOUS CULTURE WITHIN THE LAST 5 DAYS. Performed at [facility], [place] [address], [place], [place] [address]    Report Status 12/25/2021 FINAL  Final  Urine Culture     Status: Abnormal   Collection Time: 12/22/21  2:56 PM   Specimen: In/Out Cath Urine  Result Value Ref Range Status   Specimen Description   Final    IN/OUT CATH URINE Performed at [facility] [address] [place], [place] [address]    Special Requests   Final    NONE Performed at [facility], [place] [address] [place], [place] [address]    Culture >=100,000 COLONIES/mL ESCHERICHIA COLI (A)  Final   Report Status 12/24/2021 FINAL  Final   Organism ID, Bacteria ESCHERICHIA COLI (A)  Final      Susceptibility   Escherichia coli - MIC*    AMPICILLIN <=2 SENSITIVE Sensitive     CEFAZOLIN <=4 SENSITIVE Sensitive     CEFEPIME <=0.12 SENSITIVE Sensitive     CEFTRIAXONE <=0.25 SENSITIVE Sensitive     CIPROFLOXACIN >=4 RESISTANT Resistant     GENTAMICIN <=1 SENSITIVE Sensitive     IMIPENEM <=0.25 SENSITIVE Sensitive     NITROFURANTOIN <=16 SENSITIVE Sensitive     TRIMETH/SULFA <=20 SENSITIVE Sensitive     AMPICILLIN/SULBACTAM <=2 SENSITIVE Sensitive     PIP/TAZO <=4 SENSITIVE Sensitive     * >=100,000 COLONIES/mL ESCHERICHIA COLI  Culture, blood (Routine X 2) w Reflex to ID Panel     Status: None (Preliminary result)   Collection Time: 12/25/21  5:36 AM   Specimen: BLOOD  Result Value Ref Range Status   Specimen Description   Final    BLOOD RIGHT ANTECUBITAL Performed at [facility] [address], [place], [place] [address]    Special Requests   Final    BOTTLES DRAWN AEROBIC ONLY Blood Culture adequate volume Performed at [facility] [address], [place], [place] [address]    Culture   Final    NO GROWTH < 24 HOURS Performed at [facility] [address], [place], [place] [address]    Report Status PENDING  Incomplete  Culture, blood (Routine X 2) w Reflex to ID Panel     Status: None (Preliminary result)   Collection Time: 12/25/21  5:38 AM   Specimen: BLOOD  Result Value Ref Range Status   Specimen Description   Final    BLOOD BLOOD RIGHT HAND Performed at [facility] [address] [place],  [place] [address]    Special Requests   Final    BOTTLES DRAWN AEROBIC ONLY Blood Culture results may not be optimal due to an inadequate volume of blood received in culture bottles Performed at [facility] [address] [place], [place] [address]    Culture   Final    NO GROWTH < 24 HOURS Performed at [facility] [address], [place], [place] [address]    Report Status PENDING  Incomplete    Labs: CBC: Recent Labs  Lab 12/22/21 1218 12/23/21 0500 12/26/21 0508  WBC 5.8 10.2 5.4  NEUTROABS 5.5  --   --   HGB 11.2* 10.7* 11.5*  HCT 34.4* 33.0* 36.8*  MCV 90.3 90.9 91.8  PLT 98* 103* 967*   Basic Metabolic Panel: Recent Labs  Lab 12/22/21 1218 12/23/21 0500 12/25/21 0536 12/26/21 0508  NA 134* 133* 136 137  K 4.1 4.5 4.3 4.1  CL 102 103 105 106  CO2 '24 22 24 23  '$ GLUCOSE 94 96 93 92  BUN 47* 44* 42* 33*  CREATININE 1.28* 1.17 0.95 0.91  CALCIUM 8.9 8.6* 8.5* 8.6*   Liver Function Tests: Recent Labs  Lab 12/22/21 1218 12/23/21 0500  AST 19 15  ALT 13 13  ALKPHOS 108 87  BILITOT 1.6* 1.1  PROT 7.2 6.3*  ALBUMIN 3.2* 2.8*   CBG: No results for input(s): "GLUCAP" in the last 168 hours.  Discharge time spent: greater than 30  minutes.  This record has been created using Systems analyst. Errors have been sought and corrected,but may not always be located. Such creation errors do not reflect on the standard of care.   Signed: Lorella Nimrod, MD [facility] 12/26/2021

## 2021-12-28 ENCOUNTER — Ambulatory Visit: Payer: Medicare Other | Admitting: Physician Assistant

## 2021-12-28 DIAGNOSIS — I130 Hypertensive heart and chronic kidney disease with heart failure and stage 1 through stage 4 chronic kidney disease, or unspecified chronic kidney disease: Secondary | ICD-10-CM | POA: Diagnosis not present

## 2021-12-28 DIAGNOSIS — N390 Urinary tract infection, site not specified: Secondary | ICD-10-CM | POA: Diagnosis not present

## 2021-12-28 DIAGNOSIS — I2510 Atherosclerotic heart disease of native coronary artery without angina pectoris: Secondary | ICD-10-CM | POA: Diagnosis not present

## 2021-12-28 DIAGNOSIS — Z5181 Encounter for therapeutic drug level monitoring: Secondary | ICD-10-CM | POA: Diagnosis not present

## 2021-12-28 DIAGNOSIS — I5043 Acute on chronic combined systolic (congestive) and diastolic (congestive) heart failure: Secondary | ICD-10-CM | POA: Diagnosis not present

## 2021-12-28 DIAGNOSIS — N400 Enlarged prostate without lower urinary tract symptoms: Secondary | ICD-10-CM | POA: Diagnosis not present

## 2021-12-28 DIAGNOSIS — N1831 Chronic kidney disease, stage 3a: Secondary | ICD-10-CM | POA: Diagnosis not present

## 2021-12-28 DIAGNOSIS — E785 Hyperlipidemia, unspecified: Secondary | ICD-10-CM | POA: Diagnosis not present

## 2021-12-28 DIAGNOSIS — I4821 Permanent atrial fibrillation: Secondary | ICD-10-CM | POA: Diagnosis not present

## 2021-12-28 DIAGNOSIS — Z7901 Long term (current) use of anticoagulants: Secondary | ICD-10-CM | POA: Diagnosis not present

## 2021-12-29 DIAGNOSIS — I739 Peripheral vascular disease, unspecified: Secondary | ICD-10-CM | POA: Diagnosis not present

## 2021-12-29 DIAGNOSIS — Z7901 Long term (current) use of anticoagulants: Secondary | ICD-10-CM | POA: Diagnosis not present

## 2021-12-29 DIAGNOSIS — I495 Sick sinus syndrome: Secondary | ICD-10-CM | POA: Diagnosis not present

## 2021-12-29 DIAGNOSIS — F419 Anxiety disorder, unspecified: Secondary | ICD-10-CM | POA: Diagnosis not present

## 2021-12-29 DIAGNOSIS — I959 Hypotension, unspecified: Secondary | ICD-10-CM | POA: Diagnosis not present

## 2021-12-29 DIAGNOSIS — I4821 Permanent atrial fibrillation: Secondary | ICD-10-CM | POA: Diagnosis not present

## 2021-12-29 DIAGNOSIS — H9190 Unspecified hearing loss, unspecified ear: Secondary | ICD-10-CM | POA: Diagnosis not present

## 2021-12-29 DIAGNOSIS — I2510 Atherosclerotic heart disease of native coronary artery without angina pectoris: Secondary | ICD-10-CM | POA: Diagnosis not present

## 2021-12-29 DIAGNOSIS — L97821 Non-pressure chronic ulcer of other part of left lower leg limited to breakdown of skin: Secondary | ICD-10-CM | POA: Diagnosis not present

## 2021-12-29 DIAGNOSIS — I130 Hypertensive heart and chronic kidney disease with heart failure and stage 1 through stage 4 chronic kidney disease, or unspecified chronic kidney disease: Secondary | ICD-10-CM | POA: Diagnosis not present

## 2021-12-29 DIAGNOSIS — M1990 Unspecified osteoarthritis, unspecified site: Secondary | ICD-10-CM | POA: Diagnosis not present

## 2021-12-29 DIAGNOSIS — K219 Gastro-esophageal reflux disease without esophagitis: Secondary | ICD-10-CM | POA: Diagnosis not present

## 2021-12-29 DIAGNOSIS — I4510 Unspecified right bundle-branch block: Secondary | ICD-10-CM | POA: Diagnosis not present

## 2021-12-29 DIAGNOSIS — D696 Thrombocytopenia, unspecified: Secondary | ICD-10-CM | POA: Diagnosis not present

## 2021-12-29 DIAGNOSIS — I420 Dilated cardiomyopathy: Secondary | ICD-10-CM | POA: Diagnosis not present

## 2021-12-29 DIAGNOSIS — I272 Pulmonary hypertension, unspecified: Secondary | ICD-10-CM | POA: Diagnosis not present

## 2021-12-29 DIAGNOSIS — K7460 Unspecified cirrhosis of liver: Secondary | ICD-10-CM | POA: Diagnosis not present

## 2021-12-29 DIAGNOSIS — Z952 Presence of prosthetic heart valve: Secondary | ICD-10-CM | POA: Diagnosis not present

## 2021-12-29 DIAGNOSIS — E785 Hyperlipidemia, unspecified: Secondary | ICD-10-CM | POA: Diagnosis not present

## 2021-12-29 DIAGNOSIS — I5042 Chronic combined systolic (congestive) and diastolic (congestive) heart failure: Secondary | ICD-10-CM | POA: Diagnosis not present

## 2021-12-29 DIAGNOSIS — N1831 Chronic kidney disease, stage 3a: Secondary | ICD-10-CM | POA: Diagnosis not present

## 2021-12-29 DIAGNOSIS — I872 Venous insufficiency (chronic) (peripheral): Secondary | ICD-10-CM | POA: Diagnosis not present

## 2021-12-29 DIAGNOSIS — N400 Enlarged prostate without lower urinary tract symptoms: Secondary | ICD-10-CM | POA: Diagnosis not present

## 2021-12-29 DIAGNOSIS — H3530 Unspecified macular degeneration: Secondary | ICD-10-CM | POA: Diagnosis not present

## 2021-12-29 DIAGNOSIS — D649 Anemia, unspecified: Secondary | ICD-10-CM | POA: Diagnosis not present

## 2021-12-30 LAB — CULTURE, BLOOD (ROUTINE X 2)
Culture: NO GROWTH
Culture: NO GROWTH
Special Requests: ADEQUATE

## 2022-01-01 ENCOUNTER — Emergency Department (HOSPITAL_BASED_OUTPATIENT_CLINIC_OR_DEPARTMENT_OTHER)
Admission: EM | Admit: 2022-01-01 | Discharge: 2022-01-01 | Disposition: A | Discharge status: Home / Self Care | Payer: Medicare Other | Attending: Emergency Medicine | Admitting: Emergency Medicine

## 2022-01-01 ENCOUNTER — Emergency Department (HOSPITAL_BASED_OUTPATIENT_CLINIC_OR_DEPARTMENT_OTHER): Payer: Medicare Other | Admitting: Radiology

## 2022-01-01 ENCOUNTER — Encounter (HOSPITAL_BASED_OUTPATIENT_CLINIC_OR_DEPARTMENT_OTHER): Payer: Self-pay | Admitting: Emergency Medicine

## 2022-01-01 DIAGNOSIS — M1612 Unilateral primary osteoarthritis, left hip: Secondary | ICD-10-CM | POA: Diagnosis not present

## 2022-01-01 DIAGNOSIS — Z743 Need for continuous supervision: Secondary | ICD-10-CM | POA: Diagnosis not present

## 2022-01-01 DIAGNOSIS — M79605 Pain in left leg: Secondary | ICD-10-CM | POA: Diagnosis not present

## 2022-01-01 DIAGNOSIS — M79652 Pain in left thigh: Secondary | ICD-10-CM | POA: Diagnosis not present

## 2022-01-01 DIAGNOSIS — N189 Chronic kidney disease, unspecified: Secondary | ICD-10-CM | POA: Insufficient documentation

## 2022-01-01 DIAGNOSIS — Z7901 Long term (current) use of anticoagulants: Secondary | ICD-10-CM | POA: Insufficient documentation

## 2022-01-01 DIAGNOSIS — Z7401 Bed confinement status: Secondary | ICD-10-CM | POA: Diagnosis not present

## 2022-01-01 DIAGNOSIS — I2510 Atherosclerotic heart disease of native coronary artery without angina pectoris: Secondary | ICD-10-CM | POA: Diagnosis not present

## 2022-01-01 DIAGNOSIS — T699XXA Effect of reduced temperature, unspecified, initial encounter: Secondary | ICD-10-CM | POA: Diagnosis not present

## 2022-01-01 DIAGNOSIS — I4891 Unspecified atrial fibrillation: Secondary | ICD-10-CM | POA: Insufficient documentation

## 2022-01-01 DIAGNOSIS — R29898 Other symptoms and signs involving the musculoskeletal system: Secondary | ICD-10-CM | POA: Diagnosis not present

## 2022-01-01 DIAGNOSIS — M160 Bilateral primary osteoarthritis of hip: Secondary | ICD-10-CM | POA: Insufficient documentation

## 2022-01-01 DIAGNOSIS — R102 Pelvic and perineal pain: Secondary | ICD-10-CM | POA: Diagnosis not present

## 2022-01-01 DIAGNOSIS — R2243 Localized swelling, mass and lump, lower limb, bilateral: Secondary | ICD-10-CM | POA: Diagnosis not present

## 2022-01-01 DIAGNOSIS — Z79899 Other long term (current) drug therapy: Secondary | ICD-10-CM | POA: Diagnosis not present

## 2022-01-01 DIAGNOSIS — R5383 Other fatigue: Secondary | ICD-10-CM | POA: Diagnosis not present

## 2022-01-01 DIAGNOSIS — I509 Heart failure, unspecified: Secondary | ICD-10-CM | POA: Diagnosis not present

## 2022-01-01 DIAGNOSIS — R6889 Other general symptoms and signs: Secondary | ICD-10-CM | POA: Diagnosis not present

## 2022-01-01 MED ORDER — LIDOCAINE 5 % EX PTCH
1.0000 | MEDICATED_PATCH | Freq: Once | CUTANEOUS | Status: DC
  Administered 2022-01-01: 1 via TRANSDERMAL
  Filled 2022-01-01: qty 1

## 2022-01-01 MED ORDER — ACETAMINOPHEN 325 MG PO TABS
650.0000 mg | ORAL_TABLET | Freq: Once | ORAL | Status: AC
  Administered 2022-01-01: 650 mg via ORAL
  Filled 2022-01-01: qty 2

## 2022-01-01 NOTE — Discharge Instructions (Signed)
You were seen in the emergency department for your thigh pain.  You have some arthritis in your hip, otherwise you had no signs of broken bones.  You have good blood flow to your legs and no signs of blood clots causing your pain.  You may have a muscle strain or spasm.  You can continue to take Tylenol as needed for pain.  This can be taken up to every 6 hours.  You can also use lidocaine patches, ice or heat.  You should try to stretch her leg and do your normal activities as best you can to prevent her muscles from getting stiff.  You should follow-up with your primary doctor within the next week to have your symptoms rechecked and if you are having continued pain you may benefit from physical therapy.  You should return to the emergency department if your pain gets significantly worse, your toes turn blue, you are left leg significantly more swollen compared to the right or if you have any other new or concerning symptoms.

## 2022-01-01 NOTE — ED Notes (Signed)
Pt repositioned in bed and sat up. Pt currently denied any pain in feet or lower extermities.

## 2022-01-01 NOTE — ED Provider Notes (Signed)
[facility] EMERGENCY DEPT Provider Note   CSN: [id number] Arrival date & time: 01/01/22  5361     History  Chief Complaint  Patient presents with   Leg Pain    Tyler Williams is a 88 y.o. male.  Patient is a 88 year old male with a past medical history of A-fib on Coumadin, CHF, CAD, CKD, thrombocytopenia presenting to the emergency department with left leg pain.  Patient states for the last few days he has had pain on the outer part of his left upper leg.  He is unable to describe what the pain feels like.  He denies any trauma or falls or recent changes in activity.  He states he uses a walker to get around and has been able to ambulate normally.  He denies any numbness or weakness or any rash.  He denies any fevers or chills.  He states he has not taken anything for pain yet.  The history is provided by the patient.  Leg Pain      Home Medications Prior to Admission medications   Medication Sig Start Date End Date Taking? Authorizing Provider  acetaminophen (TYLENOL) 500 MG tablet Take 500 mg by mouth every 4 (four) hours as needed for moderate pain.    [provider]  atorvastatin (LIPITOR) 20 MG tablet TAKE 1 TABLET ONCE DAILY. Patient taking differently: Take 20 mg by mouth daily. 12/30/20   Sherren Mocha, MD  Calcium-Magnesium-Zinc (CAL-MAG-ZINC PO) Take 1 tablet by mouth every morning.    [provider]  cefadroxil (DURICEF) 500 MG capsule Take 2 capsules (1,000 mg total) by mouth 2 (two) times daily for 6 days. 12/27/21 01/02/22  Lorella Nimrod, MD  Cholecalciferol (VITAMIN D-3) 125 MCG (5000 UT) TABS Take 5,000 Units by mouth every morning.    [provider]  fesoterodine (TOVIAZ) 8 MG TB24 tablet Take 8 mg by mouth daily.    [provider]  finasteride (PROSCAR) 5 MG tablet Take 1 tablet (5 mg total) by mouth daily. 06/21/20   Charlynne Cousins, MD  furosemide (LASIX) 40 MG tablet Take 1.5 tablets (60 mg  total) by mouth daily. Patient taking differently: Take 60 mg by mouth daily. Take an extra 40 mg daily if needed for a weight increase of 3 pounds a day or 5 pounds in a week. 12/05/21   [name] T, PA-C  isosorbide mononitrate (IMDUR) 60 MG 24 hr tablet Take 1 tablet (60 mg total) by mouth daily. 11/25/21   Domenic Polite, MD  LORazepam (ATIVAN) 1 MG tablet Take 1 mg by mouth 2 (two) times daily as needed for anxiety.    [provider]  metoprolol succinate (TOPROL XL) 25 MG 24 hr tablet Take 0.5 tablets (12.5 mg total) by mouth daily. 10/05/21   [name] T, PA-C  Multiple Vitamins-Minerals (PRESERVISION AREDS) TABS Take 1 tablet by mouth daily. 03/04/09   [provider]  nitroGLYCERIN (NITROSTAT) 0.4 MG SL tablet Place 0.4 mg under the tongue every 5 (five) minutes as needed for chest pain.    [provider]  nystatin cream (MYCOSTATIN) Apply 1 Application topically 2 (two) times daily. 11/28/21   [provider]  polyethylene glycol (MIRALAX / GLYCOLAX) 17 g packet Take 17 g by mouth daily as needed. 12/26/21   Lorella Nimrod, MD  Polyethylene Glycol 3350 POWD Take 17 g by mouth daily.    [provider]  sacubitril-valsartan (ENTRESTO) 24-26 MG Take 1 tablet by mouth 2 (two) times  daily. Please keep upcoming appointment for future refills thank you. 07/04/21   Sherren Mocha, MD  spironolactone (ALDACTONE) 25 MG tablet Take 1 tablet (25 mg total) by mouth daily. 10/05/21   [name] T, PA-C  tamsulosin (FLOMAX) 0.4 MG CAPS capsule Take 1 capsule (0.4 mg total) by mouth daily. 06/21/20   Charlynne Cousins, MD  warfarin (COUMADIN) 2.5 MG tablet Take 2.5 mg by mouth See admin instructions. Take 2.5 mg by mouth daily on Monday, Tuesday, Wednesday, Thursday, and Friday. 11/30/21   [provider]  warfarin (COUMADIN) 2.5 MG tablet Take 1.25 mg by mouth See admin instructions. Take 1.25 mg by mouth on Saturday and Sunday.    [provider]      Allergies    Patient has no known allergies.    Review of Systems   Review of Systems  Physical Exam Updated Vital Signs BP (!) 149/71   Pulse 66   Temp 97.7 F (36.5 C) (Oral)   Resp 20  Physical Exam Vitals and nursing note reviewed.  Constitutional:      General: He is not in acute distress.    Appearance: Normal appearance.  HENT:     Head: Normocephalic and atraumatic.     Nose: Nose normal.     Mouth/Throat:     Mouth: Mucous membranes are moist.     Pharynx: Oropharynx is clear.  Eyes:     Extraocular Movements: Extraocular movements intact.     Conjunctiva/sclera: Conjunctivae normal.  Cardiovascular:     Rate and Rhythm: Normal rate and regular rhythm.     Pulses: Normal pulses.     Heart sounds: Normal heart sounds.     Comments: 1+ DP pulses bilaterally Pulmonary:     Effort: Pulmonary effort is normal.     Breath sounds: Normal breath sounds.  Abdominal:     General: Abdomen is flat.     Palpations: Abdomen is soft.     Tenderness: There is no abdominal tenderness.  Musculoskeletal:        General: Normal range of motion.     Cervical back: Normal range of motion and neck supple.     Right lower leg: Edema (1+ to mid shin) present.     Left lower leg: Edema (1+ to mid shin) present.     Comments: No midline back tenderness No pelvis tenderness Internal/external rotation of bilateral hips intact without increased pain Full flexion/extension of bilateral knees with increased pain in left lateral thigh with knee flexion, no knee joint effusion, no knee joint laxity  Skin:    General: Skin is warm and dry.     Capillary Refill: Capillary refill takes less than 2 seconds.     Comments: Chronic skin changes over bilateral shins  Neurological:     General: No focal deficit present.     Mental Status: He is alert and oriented to person, place, and time.     Sensory: No sensory deficit.     Motor: No weakness.  Psychiatric:         Mood and Affect: Mood normal.        Behavior: Behavior normal.     ED Results / Procedures / Treatments   Labs (all labs ordered are listed, but only abnormal results are displayed) Labs Reviewed - No data to display  EKG None  Radiology DG FEMUR MIN 2 VIEWS LEFT  Result Date: 01/01/2022 CLINICAL DATA:  Left leg pain for a few days.  Difficulty bearing weight. EXAM: LEFT FEMUR 2 VIEWS COMPARISON:  None Available. FINDINGS: Vascular calcifications in the left femoral vessels. No bony lesions are identified to explain the patient's symptoms. No fractures. Mild degenerative changes in the left hip without significant loss of joint space. The patient is status post left knee replacement. Limited views of the hardware are unremarkable. IMPRESSION: 1. No cause for the patient's symptoms identified. 2. Vascular calcifications in the left femoral vessels. 3. Mild degenerative changes in the left hip. Electronically Signed   By: Dorise Bullion III M.D.   On: 01/01/2022 09:58   DG Pelvis Portable  Result Date: 01/01/2022 CLINICAL DATA:  Left leg pain for a few days. EXAM: PORTABLE PELVIS 1-2 VIEWS COMPARISON:  None Available. FINDINGS: Calcified atherosclerotic changes are identified in the femoral vessels. Degenerative changes in the lower lumbar spine. Iliac bones are unremarkable. Degenerative changes are identified in the bilateral hips without significant loss of joint space. No bony lesions or fractures are identified. IMPRESSION: 1. Degenerative changes in the lower lumbar spine and bilateral hips. 2. Calcified atherosclerotic changes in the femoral vessels. 3. No other abnormalities. Electronically Signed   By: Dorise Bullion III M.D.   On: 01/01/2022 09:56    Procedures Procedures    Medications Ordered in ED Medications  lidocaine (LIDODERM) 5 % 1-3 patch (1 patch Transdermal Patch Applied 01/01/22 0942)  acetaminophen (TYLENOL) tablet 650 mg (650 mg Oral Given 01/01/22 0941)     ED Course/ Medical Decision Making/ A&P Clinical Course as of 01/01/22 1049  Sun Jan 01, 2022  1014 X-ray with degenerative changes in the hip however no other acute disease. [VK]    Clinical Course User Index [VK] Kemper Durie, DO                           Medical Decision Making This patient presents to the ED with chief complaint(s) of left thigh pain with pertinent past medical history of CAD, CHF, A-fib on Coumadin which further complicates the presenting complaint. The complaint involves an extensive differential diagnosis and also carries with it a high risk of complications and morbidity.    The differential diagnosis includes fracture, dislocation, osteoarthritis, muscle strain, patient has no neurologic symptoms and no back pain making sciatica unlikely, his legs are warm and well-perfused with palpable pulses bilaterally making ischemic limb unlikely, he has no significant swelling and is compliant on his Coumadin making DVT unlikely  Additional history obtained: Additional history obtained from N/A Records reviewed [facility]  ED Course and Reassessment: Upon patient's arrival to the emergency department he is awake alert and well-appearing in no acute distress.  He has no significant bony tenderness, however due to his age, concern is possible occult fracture and will have x-rays performed.  His legs are warm and well-perfused making ischemic limb unlikely, he has no evidence of DVT and is compliant on his Coumadin.  He was given Tylenol and lidocaine patch for pain.  Independent labs interpretation:  N/A  Independent visualization of imaging: - I independently visualized the following imaging with scope of interpretation limited to determining acute life threatening conditions related to emergency care: Pelvis/left femur x-ray, which revealed osteoarthritis, otherwise no acute disease  Consultation: - Consulted or discussed management/test  interpretation w/ external professional: N/A  Consideration for admission or further workup: Patient has no emergent conditions requiring admission or further work-up at this time and is stable for discharge with primary  care follow-up Social Determinants of health: N/A    Amount and/or Complexity of Data Reviewed Radiology: ordered.  Risk OTC drugs. Prescription drug management.          Final Clinical Impression(s) / ED Diagnoses Final diagnoses:  Osteoarthritis of both hips, unspecified osteoarthritis type  Left thigh pain    Rx / DC Orders ED Discharge Orders     None         Kemper Durie, DO 01/01/22 1049

## 2022-01-01 NOTE — ED Notes (Signed)
Attempted to call [facility] but there was no answer, just voice mail

## 2022-01-01 NOTE — ED Triage Notes (Signed)
Left leg pain for few days, hard time bearing weight.

## 2022-01-01 NOTE — ED Notes (Signed)
2nd attempt to call no answer

## 2022-01-02 ENCOUNTER — Ambulatory Visit (INDEPENDENT_AMBULATORY_CARE_PROVIDER_SITE_OTHER): Payer: Medicare Other | Admitting: Podiatry

## 2022-01-02 ENCOUNTER — Encounter: Payer: Self-pay | Admitting: Podiatry

## 2022-01-02 DIAGNOSIS — B351 Tinea unguium: Secondary | ICD-10-CM | POA: Diagnosis not present

## 2022-01-02 DIAGNOSIS — M79675 Pain in left toe(s): Secondary | ICD-10-CM | POA: Diagnosis not present

## 2022-01-02 DIAGNOSIS — I2510 Atherosclerotic heart disease of native coronary artery without angina pectoris: Secondary | ICD-10-CM | POA: Diagnosis not present

## 2022-01-02 DIAGNOSIS — L97821 Non-pressure chronic ulcer of other part of left lower leg limited to breakdown of skin: Secondary | ICD-10-CM | POA: Diagnosis not present

## 2022-01-02 DIAGNOSIS — M79674 Pain in right toe(s): Secondary | ICD-10-CM

## 2022-01-02 DIAGNOSIS — I130 Hypertensive heart and chronic kidney disease with heart failure and stage 1 through stage 4 chronic kidney disease, or unspecified chronic kidney disease: Secondary | ICD-10-CM | POA: Diagnosis not present

## 2022-01-02 DIAGNOSIS — I872 Venous insufficiency (chronic) (peripheral): Secondary | ICD-10-CM | POA: Diagnosis not present

## 2022-01-02 DIAGNOSIS — I5042 Chronic combined systolic (congestive) and diastolic (congestive) heart failure: Secondary | ICD-10-CM | POA: Diagnosis not present

## 2022-01-02 DIAGNOSIS — N1831 Chronic kidney disease, stage 3a: Secondary | ICD-10-CM | POA: Diagnosis not present

## 2022-01-03 DIAGNOSIS — N1831 Chronic kidney disease, stage 3a: Secondary | ICD-10-CM | POA: Diagnosis not present

## 2022-01-03 DIAGNOSIS — I130 Hypertensive heart and chronic kidney disease with heart failure and stage 1 through stage 4 chronic kidney disease, or unspecified chronic kidney disease: Secondary | ICD-10-CM | POA: Diagnosis not present

## 2022-01-03 DIAGNOSIS — I2510 Atherosclerotic heart disease of native coronary artery without angina pectoris: Secondary | ICD-10-CM | POA: Diagnosis not present

## 2022-01-03 DIAGNOSIS — I872 Venous insufficiency (chronic) (peripheral): Secondary | ICD-10-CM | POA: Diagnosis not present

## 2022-01-03 DIAGNOSIS — I5042 Chronic combined systolic (congestive) and diastolic (congestive) heart failure: Secondary | ICD-10-CM | POA: Diagnosis not present

## 2022-01-03 DIAGNOSIS — L97821 Non-pressure chronic ulcer of other part of left lower leg limited to breakdown of skin: Secondary | ICD-10-CM | POA: Diagnosis not present

## 2022-01-03 NOTE — Progress Notes (Unsigned)
Cardiology Office Note:    Date:  01/04/2022   ID:  Tyler Williams, DOB [birth year], MRN [id number]  PCP:  Burnard Bunting, MD  [facility] Cardiologist:  Sherren Mocha, MD    Referring MD: Burnard Bunting, MD   Chief Complaint:  Hospitalization Follow-up (CHF)    Patient Profile: Permanent atrial fibrillation Coumadin anticoagulation Coronary artery disease S/p POBA in the 1980s, 1990s Known high-grade stenosis of mid RCA; unsuccessful attempted PCI in past Cath 7/19: Mid RCA 95, 85 >>unsuccessful PCI attempt to the RCA HFmrEF (heart failure with mildly reduced ejection fraction)  Echo 4/22: EF 45-50 with inferior and inferolateral HK, GRII DD Echo 5/23: EF 45-50, inf and post HK, mild LVH, mildly reduced RVSF, severe BAE, mild to mod MR, trivial AI, RVSP 64  Echo 12/23/21: EF 45-50, global HK, mod to severe reduced RVSF, mod elev PASP (RVSP 52.5), severe BAE, mod MR, mild to mod TR, AVR w trivial AI (normal fxn),  Mild to mod MR (echocardiogram 06/2021) Severe pulmonary hypertension (Echocardiogram 06/2021) Peripheral arterial disease AAA s/p repair Carotid artery stenosis [place] 12/04/16: Bilat ICA 1-39 Aortic stenosis S/p TAVR 06/2016 Venous insufficiency  Aortic atherosclerosis History of urinary retention Cirrhosis Noted on [place] in 11/2021 during admit - conservative management   Cardiac Studies & Procedures   CARDIAC CATHETERIZATION  CARDIAC CATHETERIZATION 08/31/2017  Narrative  Mid RCA-1 lesion is 95% stenosed.  Mid RCA-2 lesion is 85% stenosed.  Mid LM lesion is 30% stenosed.  Post intervention, there is a 90% residual stenosis.  Balloon angioplasty was performed using a BALLOON SAPPHIRE 2.5X12.  LV end diastolic pressure is normal.  1. Single vessel obstructive CAD involving the mid RCA 2. Normal LVEDP 3. No AV gradient. 4. Unsuccessful PCI of the RCA due to inability to expand the lesion. I do not feel that any other attempt  at opening the RCA is feasible and would continue medical management.  Recommend to resume Warfarin, at currently prescribed dose and frequency, on 09/01/17.  Recommend concurrent antiplatelet therapy of Aspirin 28m daily for 1 month.  Findings Coronary Findings Diagnostic  Dominance: Right  Left Main Mid LM lesion is 30% stenosed.  Left Anterior Descending Vessel was injected. Vessel is normal in caliber. Vessel is angiographically normal.  Left Circumflex Vessel was injected. Vessel is normal in caliber. Vessel is angiographically normal.  Right Coronary Artery Mid RCA-1 lesion is 95% stenosed. The lesion is severely calcified. Mid RCA-2 lesion is 85% stenosed. The lesion is calcified.  Intervention  Mid RCA-1 lesion Angioplasty CATHETER LAUNCHER 6FR AR1 guide catheter was inserted. WIRE HI TORQ WHISPER MS 300CM guidewire used to cross lesion. Balloon angioplasty was performed using a BALLOON SAPPHIRE 2.5X12. Maximum pressure: 12 atm. Post-Intervention Lesion Assessment The intervention was unsuccessful due to lesion rigidity. Pre-interventional TIMI flow is 3. Post-intervention TIMI flow is 3. No complications occurred at this lesion. There was a round filling defect in the distal vessel at the end of procedure consistent with thrombus. The patient was pain free. There is a 90% residual stenosis post intervention.   CARDIAC CATHETERIZATION  CARDIAC CATHETERIZATION 05/12/2016  Narrative 1. Severe RCA stenosis with heavy calcification unchanged from 2014 study 2. Minor nonobstructive LAD and LCx stenosis 3. Severely calcified aortic valve with severe aortic stenosis (mean gradient 35 mmHg and AVA 1.06 square cm)  Recommend: Further evaluation by the multidisciplinary heart team for consideration of TAVR/PCI or other treatment options. Will obtain updated echo and CTA studies.  Findings Coronary  Findings Diagnostic  Dominance: Right  Left Anterior Descending There is  mild the vessel.  Ramus Intermedius The vessel exhibits minimal luminal irregularities.  Left Circumflex There is mild the vessel.  Right Coronary Artery The lesion is severely calcified. The lesion is severely calcified. The lesion is moderately calcified.  Intervention  No interventions have been documented.     ECHOCARDIOGRAM  ECHOCARDIOGRAM COMPLETE 12/23/2021  Narrative ECHOCARDIOGRAM REPORT    Patient Name:   Tyler Williams Date of Exam: 12/23/2021 Medical Rec #:  [id number]             Height:       68.0 in Accession #:    [id number]            Weight:       176.0 lb Date of Birth:  [birth year]              BSA:          1.936 m Patient Age:    88 years              BP:           119/49 mmHg Patient Gender: M                     HR:           59 bpm. Exam Location:  Inpatient  Procedure: 2D Echo, Cardiac Doppler and Color Doppler  Indications:    CHF-Acute Systolic R42.70  History:        Patient has prior history of Echocardiogram examinations, most recent 07/06/2021. CAD, Pulmonary HTN and Abdominal Aortic Aneurysm, Arrythmias:Atrial Fibrillation and RBBB; Risk Factors:GERD and Hypertension. Aortic Valve: 29 mm Sapien prosthetic, stented (TAVR) valve is present in the aortic position. Procedure Date: 06/13/16.  Sonographer:    Bernadene Person RDCS Referring Phys: [id number] [place]   1. Left ventricular ejection fraction, by estimation, is 45 to 50%. Left ventricular ejection fraction by 2D MOD biplane is 47.6 %. The left ventricle has mildly decreased function. The left ventricle demonstrates global hypokinesis. Left ventricular diastolic function could not be evaluated. Elevated left ventricular end-diastolic pressure. 2. Right ventricular systolic function moderate to severely reduced. The right ventricular size is mildly enlarged. There is moderately elevated pulmonary artery systolic pressure. The estimated right ventricular systolic  pressure is 31.5 mmHg. 3. Left atrial size was severely dilated. 4. Right atrial size was severely dilated. 5. There is no evidence of cardiac tamponade. 6. The mitral valve is abnormal. Moderate mitral valve regurgitation. 7. Tricuspid valve regurgitation is mild to moderate. 8. The aortic valve has been repaired/replaced. Aortic valve regurgitation is trivial. There is a 29 mm Sapien prosthetic (TAVR) valve present in the aortic position. Procedure Date: 06/13/16. Echo findings are consistent with normal structure and function of the aortic valve prosthesis. Aortic regurgitation PHT measures 512 msec. Aortic valve mean gradient measures 3.7 mmHg. 9. The inferior vena cava is dilated in size with <50% respiratory variability, suggesting right atrial pressure of 15 mmHg.  Comparison(s): Changes from prior study are noted. 07/06/2021: LVEF 45-50%, mean TAVR gradient 6 mmHg.  FINDINGS Left Ventricle: Left ventricular ejection fraction, by estimation, is 45 to 50%. Left ventricular ejection fraction by 2D MOD biplane is 47.6 %. The left ventricle has mildly decreased function. The left ventricle demonstrates global hypokinesis. The left ventricular internal cavity size was normal in size. There is no left ventricular hypertrophy. Left ventricular diastolic function  could not be evaluated due to atrial fibrillation. Left ventricular diastolic function could not be evaluated. Elevated left ventricular end-diastolic pressure.  Right Ventricle: The right ventricular size is mildly enlarged. No increase in right ventricular wall thickness. Right ventricular systolic function moderate to severely reduced. There is moderately elevated pulmonary artery systolic pressure. The tricuspid regurgitant velocity is 3.06 m/s, and with an assumed right atrial pressure of 15 mmHg, the estimated right ventricular systolic pressure is 31.5 mmHg.  Left Atrium: Left atrial size was severely dilated.  Right Atrium: Right  atrial size was severely dilated.  Pericardium: Trivial pericardial effusion is present. The pericardial effusion is posterior to the left ventricle. There is no evidence of cardiac tamponade.  Mitral Valve: The mitral valve is abnormal. Mild to moderate mitral annular calcification. Moderate mitral valve regurgitation, with posteriorly-directed jet.  Tricuspid Valve: The tricuspid valve is grossly normal. Tricuspid valve regurgitation is mild to moderate.  Aortic Valve: The aortic valve has been repaired/replaced. Aortic valve regurgitation is trivial. Aortic regurgitation PHT measures 512 msec. Aortic valve mean gradient measures 3.7 mmHg. Aortic valve peak gradient measures 7.3 mmHg. Aortic valve area, by VTI measures 4.29 cm. There is a 29 mm Sapien prosthetic, stented (TAVR) valve present in the aortic position. Procedure Date: 06/13/16. Echo findings are consistent with normal structure and function of the aortic valve prosthesis.  Pulmonic Valve: The pulmonic valve was grossly normal. Pulmonic valve regurgitation is trivial.  Aorta: The aortic root and ascending aorta are structurally normal, with no evidence of dilitation.  Venous: The inferior vena cava is dilated in size with less than 50% respiratory variability, suggesting right atrial pressure of 15 mmHg.  IAS/Shunts: No atrial level shunt detected by color flow Doppler.  Additional Comments: There is pleural effusion in the left lateral region.   LEFT VENTRICLE PLAX 2D                        Biplane EF (MOD) LVIDd:         5.20 cm         LV Biplane EF:   Left LVIDs:         3.90 cm                          ventricular LV PW:         0.80 cm                          ejection LV IVS:        0.80 cm                          fraction by LVOT diam:     2.70 cm                          2D MOD LV SV:         123                              biplane is LV SV Index:   63                               47.6 %. LVOT Area:  5.73  cm Diastology LV e' medial:    3.66 cm/s LV Volumes (MOD)               LV E/e' medial:  41.0 LV vol d, MOD    139.0 ml      LV e' lateral:   5.46 cm/s A2C:                           LV E/e' lateral: 27.5 LV vol d, MOD    117.0 ml A4C: LV vol s, MOD    78.6 ml A2C: LV vol s, MOD    56.1 ml A4C: LV SV MOD A2C:   60.4 ml LV SV MOD A4C:   117.0 ml LV SV MOD BP:    60.9 ml  RIGHT VENTRICLE RV S prime:     4.78 cm/s TAPSE (M-mode): 0.9 cm  LEFT ATRIUM              Index        RIGHT ATRIUM           Index LA diam:        5.60 cm  2.89 cm/m   RA Area:     38.10 cm LA Vol (A2C):   129.0 ml 66.64 ml/m  RA Volume:   154.00 ml 79.56 ml/m LA Vol (A4C):   126.0 ml 65.09 ml/m LA Biplane Vol: 131.0 ml 67.68 ml/m AORTIC VALVE                    PULMONIC VALVE AV Area (Vmax):    4.08 cm     PR End Diast Vel: 7.51 msec AV Area (Vmean):   4.23 cm AV Area (VTI):     4.29 cm AV Vmax:           135.33 cm/s AV Vmean:          85.333 cm/s AV VTI:            0.285 m AV Peak Grad:      7.3 mmHg AV Mean Grad:      3.7 mmHg LVOT Vmax:         96.50 cm/s LVOT Vmean:        63.100 cm/s LVOT VTI:          0.214 m LVOT/AV VTI ratio: 0.75 AI PHT:            512 msec  AORTA Ao Root diam: 3.30 cm Ao Asc diam:  3.40 cm  MITRAL VALVE                  TRICUSPID VALVE MV Area (PHT): 5.27 cm       TR Peak grad:   37.5 mmHg MV Decel Time: 144 msec       TR Vmax:        306.00 cm/s MR Peak grad:    133.4 mmHg MR Mean grad:    89.5 mmHg    SHUNTS MR Vmax:         577.50 cm/s  Systemic VTI:  0.21 m MR Vmean:        454.0 cm/s   Systemic Diam: 2.70 cm MR PISA:         1.01 cm MR PISA Eff ROA: 7 mm MR PISA Radius:  0.40 cm MV E velocity: 150.00 cm/s MV A velocity: 46.00 cm/s MV E/A ratio:  3.26  Lyman Bishop MD Electronically  signed by Lyman Bishop MD Signature Date/Time: 12/23/2021/11:29:01 AM    Final   TEE  ECHO TEE 06/13/2016  Narrative *[facility]* [address] [place], [place] [address] [phone]  ------------------------------------------------------------------- Transesophageal Echocardiography  Patient:    Tyler Williams, Tyler Williams MR #:       [id number] Study Date: 06/13/2016 Gender:     M Age:        14 Height: Weight: BSA: Pt. Status: Room:  ATTENDING  Sherren Mocha, MD Livia Snellen   Sherren Mocha, MD REFERRING  Sherren Mocha, MD  cc:  ------------------------------------------------------------------- LV EF: 55% -   60%  ------------------------------------------------------------------- Study Conclusions  - Left ventricle: Systolic function was normal. The estimated ejection fraction was in the range of 55% to 60%. Wall motion was normal; there were no regional wall motion abnormalities. - Aortic valve: Cusp separation was severely reduced. - Left atrium: The atrium was severely dilated. No evidence of thrombus in the atrial cavity or appendage. No evidence of thrombus in the atrial cavity or appendage but there was heavy smoke present. - Right atrium: The atrium was mildly dilated. No evidence of thrombus in the atrial cavity or appendage. - Atrial septum: There was a patent foramen ovale.  Impressions:  - This was a periprocedural echocardiogram during a TAVR procedure.  Native aortic valve was severely calcified and thickened with severely restricted leaflet opening. Aortic stenosis was severe with peak/mean gradients 56/32 mmHg. No aortic regurgitation. Mild mitral regurgitation, mild tricuspid regurgitation.  A 29 mm Edwards-SAPIEN 3 valve was successfully deployed in the aortic position. Post deployment transaortic gradients were normal with peak/mean 4/2 mmHg. There was no central aortic regurgitation or only trivial paravalvular leak. Mitral and tricuspid regurgitations were unchanged. There was no pericardial effusion pre and post procedure.  There is a present PFO with a very  small left to right flow seen on Doppler images.  ------------------------------------------------------------------- Study data:   Procedure:  Initial setup. The patient was brought to the laboratory. Surface ECG leads were monitored. Sedation. Conscious sedation was administered. Transesophageal echocardiography. Topical anesthesia was obtained using viscous lidocaine. A transesophageal probe was inserted by the attending cardiologist. Image quality was adequate.  Study completion:  The patient tolerated the procedure well. There were no complications. Diagnostic transesophageal echocardiography.  2D and color Doppler.  Birthdate:  Patient birthdate: 11-10-1926.  Age:  Patient is 88 yr old.  Sex:  Gender: male.  Study date:  Study date: 06/13/2016. Study time: 01:20 PM.  -------------------------------------------------------------------  ------------------------------------------------------------------- Left ventricle:  Systolic function was normal. The estimated ejection fraction was in the range of 55% to 60%. Wall motion was normal; there were no regional wall motion abnormalities.  ------------------------------------------------------------------- Aortic valve:   Trileaflet; severely thickened, severely calcified leaflets. Cusp separation was severely reduced.  Doppler:  There was no significant regurgitation.  ------------------------------------------------------------------- Aorta:  There was no atheroma. There was no evidence for dissection. Aortic root: The aortic root was not dilated. Ascending aorta: The ascending aorta was normal in size. Aortic arch: The aortic arch was normal in size. Descending aorta: The descending aorta was normal in size.  ------------------------------------------------------------------- Mitral valve:   Structurally normal valve.   Leaflet separation was normal.  Doppler:  There was no significant  regurgitation.  ------------------------------------------------------------------- Left atrium:  The atrium was severely dilated.  No evidence of thrombus in the atrial cavity or appendage. No evidence of thrombus in the atrial cavity or appendage but there was heavy smoke present. The appendage  was morphologically a left appendage, multilobulated, and of normal size. Emptying velocity was normal.  ------------------------------------------------------------------- Atrial septum:  There was a patent foramen ovale.  ------------------------------------------------------------------- Right ventricle:  The cavity size was normal. Wall thickness was normal. Systolic function was normal.  ------------------------------------------------------------------- Pulmonic valve:    Structurally normal valve.  ------------------------------------------------------------------- Tricuspid valve:   Structurally normal valve.   Leaflet separation was normal.  Doppler:  There was mild regurgitation.  ------------------------------------------------------------------- Pulmonary artery:   The main pulmonary artery was normal-sized.  ------------------------------------------------------------------- Right atrium:  The atrium was mildly dilated.  No evidence of thrombus in the atrial cavity or appendage. The appendage was morphologically a right appendage.  ------------------------------------------------------------------- Pericardium:  There was no pericardial effusion.  ------------------------------------------------------------------- Prepared and Electronically Authenticated by  Ena Dawley, M.D. 2018-05-01T15:47:59    CT SCANS  CT CORONARY MORPH W/CTA COR W/SCORE 05/24/2016  Addendum 05/24/2016  8:56 AM ADDENDUM REPORT: 05/24/2016 08:53  CLINICAL DATA:  88 year old male with severe aortic stenosis.  EXAM: Cardiac TAVR CT  TECHNIQUE: The patient was scanned on a Philips 256  scanner. A 120 kV retrospective scan was triggered in the descending thoracic aorta at 111 HU's. Gantry rotation speed was 270 msecs and collimation was .9 mm. No beta blockade or nitro were given. The 3D data set was reconstructed in 5% intervals of the R-R cycle. Systolic and diastolic phases were analyzed on a dedicated work station using MPR, MIP and VRT modes. The patient received 80 cc of contrast.  FINDINGS: Aortic Valve: Trileaflet, severely thickened and symmetrically severely calcified aortic valve with severely restricted leaflet opening. There are minimal calcifications extending into the LVOT.  Aorta: Normal size, moderate diffuse calcifications with severe atherosclerosis. No dissection.  Sinotubular Junction:  30 x 27 mm  Ascending Thoracic Aorta:  32 x 32 mm  Aortic Arch:  29 x 25 mm  Descending Thoracic Aorta:  28 x 26 mm  Sinus of Valsalva Measurements:  Non-coronary:  33 mm  Right -coronary:  34 mm  Left -coronary:  36 mm  Coronary Artery Height above Annulus:  Left Main:  15 mm  Right Coronary:  20 mm  Virtual Basal Annulus Measurements:  Maximum/Minimum Diameter:  31 x 26 mm  Perimeter:  106 mm  Area:  622 mm2  Optimum Fluoroscopic Angle for Delivery:  LAO5 CAU 5  IMPRESSION: 1. Trileaflet, severely thickened and symmetrically severely calcified aortic valve with severely restricted leaflet opening. There are minimal calcifications extending into the LVOT. Annular measurements suitable for delivery of a 29 mm Edwards-SAPIEN 3 TAVR valve.  2.  Sufficient annulus to coronary distance.  3.  Optimum Fluoroscopic Angle for Delivery:  LAO5 CAU 5.  4. There is a filling defect in the tip of a large left atrial appendage. This might represent lack of contrast or a thrombus. A TEE prior to the procedure is recommended.  Ena Dawley   Electronically Signed By: Ena Dawley On: 05/24/2016 08:53  Narrative EXAM: OVER-READ  INTERPRETATION  CT CHEST  The following report is an over-read performed by radiologist Dr. Rebekah [facility]field [facility] on 05/24/2016. This over-read does not include interpretation of cardiac or coronary anatomy or pathology. The coronary CTA interpretation by the cardiologist is attached.  COMPARISON:  Chest CT 12/03/2012.  FINDINGS: Extracardiac findings will be described separately under contemporaneously obtained CTA of the chest, abdomen and pelvis 05/23/2016.  IMPRESSION: Please see separate dictation for contemporaneously obtained CTA of the chest, abdomen and pelvis 05/23/2016 for full description  of extracardiac findings.  Electronically Signed: By: Vinnie Langton M.D. On: 05/24/2016 08:11           History of Present Illness:   Tyler Williams is a 88 y.o. male with the above problem list.  He was last seen 12/05/21. Pt was admx in Oct 2023 with a/c CHF.  He was vol overloaded and his Lasix was ? to 60 mg once daily.   He was admx 11/9-11/13 with a/c CHF in the setting of urosepsis (UCx and BC + for E coli). He was diuresed and tx with antibiotics.  He went back the emergency room with left leg pain from arthritis on 01/01/2022.Marland Kitchen  He returns for f/u. He is here with his son. Since DC, he has been doing well. His breathing is stable. He was able to walk with his walker a long distance recently and did not have to stop to catch his breath. He has not had chest pain, orthopnea, syncope. His LE edema is stable. He continues to wear compression hose.      Past Medical History:  Diagnosis Date   AAA (abdominal aortic aneurysm) (HCC)    s/p repair   Aortic atherosclerosis (HCC)    Arthritis    knees   CAD (coronary artery disease)    a. s/p POBA 1980s, 1990s (details unclear). b. known stenotic RCA (previous unsuccessful intervention)   Cancer ([place])    skin cancer removed, right hand, left leg, chest   Carotid artery disease (HCC)    Chronic  anticoagulation    Coumadin   Chronic kidney disease, stage 3a (HCC)    Cirrhosis of liver ([place])    by imaging   Coronary artery disease    GERD (gastroesophageal reflux disease)    Heart failure with mildly reduced ejection fraction (HFmrEF) ([place])    History of pulmonary embolism    1964 related to dislocated hip on right    History of skin cancer    Hypercholesteremia    Hypertension    Incidental cecal mass noted on CT imaging 05/24/2016   **An incidental finding of potential clinical significance has been found. 4.6 x 5.6 x 3.7 cm masslike lesion in the cecum adjacent to the ileocecal valve may represent a large polyp or colonic neoplasm. Correlation with nonemergent colonoscopy is strongly recommended in the near future to better evaluate this finding.**   Macular degeneration    eye injections   Microscopic hematuria    Followed by urology   Permanent atrial fibrillation [facility])    Pulmonary hypertension (HCC)    PVD (peripheral vascular disease) ([place])    Bilateral renal artery atherosclerosis, SMA stenosis with collateralization   RBBB    Chronic   S/P TAVR (transcatheter aortic valve replacement) 06/13/2016   29 mm Edwards Sapien 3 transcatheter heart valve placed via percutaneous right transfemoral approach   Severe aortic stenosis    s/p TAVR   SSS (sick sinus syndrome) (HCC)    Current Medications: Current Meds  Medication Sig   acetaminophen (TYLENOL) 500 MG tablet Take 500 mg by mouth every 4 (four) hours as needed for moderate pain.   atorvastatin (LIPITOR) 20 MG tablet TAKE 1 TABLET ONCE DAILY.   Calcium-Magnesium-Zinc (CAL-MAG-ZINC PO) Take 1 tablet by mouth every morning.   Cholecalciferol (VITAMIN D-3) 125 MCG (5000 UT) TABS Take 5,000 Units by mouth every morning.   fesoterodine (TOVIAZ) 8 MG TB24 tablet Take 8 mg by mouth daily.   finasteride (PROSCAR) 5 MG tablet  Take 1 tablet (5 mg total) by mouth daily.   furosemide (LASIX) 40 MG tablet Take 1.5 tablets  (60 mg total) by mouth daily.   isosorbide mononitrate (IMDUR) 60 MG 24 hr tablet Take 1 tablet (60 mg total) by mouth daily.   LORazepam (ATIVAN) 1 MG tablet Take 1 mg by mouth 2 (two) times daily as needed for anxiety.   metoprolol succinate (TOPROL XL) 25 MG 24 hr tablet Take 0.5 tablets (12.5 mg total) by mouth daily.   Multiple Vitamins-Minerals (PRESERVISION AREDS) TABS Take 1 tablet by mouth daily.   nitroGLYCERIN (NITROSTAT) 0.4 MG SL tablet Place 0.4 mg under the tongue every 5 (five) minutes as needed for chest pain.   nystatin cream (MYCOSTATIN) Apply 1 Application topically 2 (two) times daily.   polyethylene glycol (MIRALAX / GLYCOLAX) 17 g packet Take 17 g by mouth daily as needed.   Polyethylene Glycol 3350 POWD Take 17 g by mouth daily.   sacubitril-valsartan (ENTRESTO) 24-26 MG Take 1 tablet by mouth 2 (two) times daily. Please keep upcoming appointment for future refills thank you.   spironolactone (ALDACTONE) 25 MG tablet Take 1 tablet (25 mg total) by mouth daily.   tamsulosin (FLOMAX) 0.4 MG CAPS capsule Take 1 capsule (0.4 mg total) by mouth daily.   warfarin (COUMADIN) 2.5 MG tablet Take 2.5 mg by mouth See admin instructions. Take 2.5 mg by mouth daily on Monday, Tuesday, Wednesday, Thursday, and Friday.   warfarin (COUMADIN) 2.5 MG tablet Take 1.25 mg by mouth See admin instructions. Take 1.25 mg by mouth on Saturday and Sunday.    Allergies:   Patient has no known allergies.   Social History   Occupational History   Not on file  Tobacco Use   Smoking status: Never   Smokeless tobacco: Never  Vaping Use   Vaping Use: Never used  Substance and Sexual Activity   Alcohol use: Not Currently    Comment: patient doesn't drink anymore   Drug use: Never   Sexual activity: Not on file    Family Hx: The patient's family history includes Heart attack (age of onset: 38) in his mother.  Review of Systems  Gastrointestinal:  Negative for hematochezia.  Genitourinary:   Negative for hematuria.     EKGs/Labs/Other Test Reviewed:    EKG:  EKG is not ordered today.     Recent Labs: 11/23/2021: Magnesium 2.3 12/22/2021: B Natriuretic Peptide 1,164.9 12/23/2021: ALT 13 12/26/2021: BUN 33; Creatinine, Ser 0.91; Hemoglobin 11.5; Platelets 147; Potassium 4.1; Sodium 137   Recent Lipid Panel No results for input(s): "CHOL", "TRIG", "HDL", "VLDL", "LDLCALC", "LDLDIRECT" in the last 8760 hours.    Risk Assessment/Calculations/Metrics:    CHA2DS2-VASc Score = 5   This indicates a 7.2% annual risk of stroke. The patient's score is based upon: CHF History: 1 HTN History: 1 Diabetes History: 0 Stroke History: 0 Vascular Disease History: 1 Age Score: 2 Gender Score: 0             Physical Exam:    VS:  BP (!) 136/50   Pulse 66   Ht 5' 8" (1.727 m)   Wt 170 lb (77.1 kg)   SpO2 97%   BMI 25.85 kg/m     Wt Readings from Last 3 Encounters:  01/04/22 170 lb (77.1 kg)  12/25/21 167 lb (75.8 kg)  12/05/21 176 lb (79.8 kg)    Constitutional:      Appearance: Healthy appearance. Not in distress.  Neck:  Vascular: No JVR.  Pulmonary:     Effort: Pulmonary effort is normal.     Breath sounds: No wheezing. No rales.  Cardiovascular:     Normal rate. Irregularly irregular rhythm. Normal S1. Normal S2.      Murmurs: There is no murmur.  Edema:    Peripheral edema present.    Pretibial: bilateral 1+ edema of the pretibial area. Abdominal:     Palpations: Abdomen is soft.  Skin:    General: Skin is warm and dry.  Neurological:     General: No focal deficit present.     Mental Status: Alert and oriented to person, place and time.          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  He was recently admitted with decompensated HF in the setting of urosepsis. Since DC, he has done well. His volume is currently stable. NYHA IIb-III.  Continue Lasix 60 mg daily,  Imdur 60 mg daily, Toprol-XL 12.5 mg daily, Entresto 24/26 mg twice daily, spironolactone 25 mg daily. Obtain f/u BMET today.   Permanent atrial fibrillation (HCC) Continue Coumadin as directed by Coumadin clinic.    CAD (coronary artery disease) History of multiple PCI procedures in the past.  He has known high-grade disease in the RCA and prior PCI attempt was unsuccessful.  He is not currently having anginal symptoms.  Continue Lipitor 20 mg daily, Imdur 60 mg daily, Toprol-XL 12.5 mg once daily.  Essential hypertension Blood pressure controlled.  Continue Imdur 60 mg daily, Toprol-XL 12.5 mg daily, Entresto 24/26 mg twice daily, spironolactone 25 mg daily.  S/P TAVR (transcatheter aortic valve replacement) TAVR functioning normally on most recent echocardiogram Nov 2023.  Continue SBE prophylaxis.            Dispo:  Return in about 3 months (around 04/06/2022) for Routine Follow Up, w/ [name], PA-C and Dr. Burt Knack in 6 mos.   Medication Adjustments/Labs and Tests Ordered: Current medicines are reviewed at length with the patient today.  Concerns regarding medicines are outlined above.  Tests Ordered: Orders Placed This Encounter  Procedures   Basic metabolic panel   Medication Changes: No orders of the defined types were placed in this encounter.  Signed, [name], PA-C  01/04/2022 12:04 PM    [address], [place], [place]  [address] Phone: [phone]; Fax: [phone]

## 2022-01-04 ENCOUNTER — Ambulatory Visit: Payer: Medicare Other | Attending: Physician Assistant | Admitting: Physician Assistant

## 2022-01-04 ENCOUNTER — Encounter: Payer: Self-pay | Admitting: Physician Assistant

## 2022-01-04 VITALS — BP 136/50 | HR 66 | Ht 68.0 in | Wt 170.0 lb

## 2022-01-04 DIAGNOSIS — I10 Essential (primary) hypertension: Secondary | ICD-10-CM | POA: Insufficient documentation

## 2022-01-04 DIAGNOSIS — I2510 Atherosclerotic heart disease of native coronary artery without angina pectoris: Secondary | ICD-10-CM | POA: Diagnosis not present

## 2022-01-04 DIAGNOSIS — Z952 Presence of prosthetic heart valve: Secondary | ICD-10-CM | POA: Diagnosis not present

## 2022-01-04 DIAGNOSIS — I5020 Unspecified systolic (congestive) heart failure: Secondary | ICD-10-CM | POA: Diagnosis not present

## 2022-01-04 DIAGNOSIS — I4821 Permanent atrial fibrillation: Secondary | ICD-10-CM | POA: Insufficient documentation

## 2022-01-04 DIAGNOSIS — I25119 Atherosclerotic heart disease of native coronary artery with unspecified angina pectoris: Secondary | ICD-10-CM

## 2022-01-04 NOTE — Assessment & Plan Note (Addendum)
Ejection fraction 45-50 by echocardiogram in Nov 2023.  He was recently admitted with decompensated HF in the setting of urosepsis. Since DC, he has done well. His volume is currently stable. NYHA IIb-III.  Continue Lasix 60 mg daily, Imdur 60 mg daily, Toprol-XL 12.5 mg daily, Entresto 24/26 mg twice daily, spironolactone 25 mg daily. Obtain f/u BMET today.

## 2022-01-04 NOTE — Assessment & Plan Note (Signed)
History of multiple PCI procedures in the past.  He has known high-grade disease in the RCA and prior PCI attempt was unsuccessful.  He is not currently having anginal symptoms.  Continue Lipitor 20 mg daily, Imdur 60 mg daily, Toprol-XL 12.5 mg once daily.

## 2022-01-04 NOTE — Assessment & Plan Note (Signed)
TAVR functioning normally on most recent echocardiogram Nov 2023.  Continue SBE prophylaxis.

## 2022-01-04 NOTE — Assessment & Plan Note (Signed)
Blood pressure controlled.  Continue Imdur 60 mg daily, Toprol-XL 12.5 mg daily, Entresto 24/26 mg twice daily, spironolactone 25 mg daily.

## 2022-01-04 NOTE — Assessment & Plan Note (Signed)
Continue Coumadin as directed by Coumadin clinic.

## 2022-01-04 NOTE — Patient Instructions (Signed)
Medication Instructions:  Your physician recommends that you continue on your current medications as directed. Please refer to the Current Medication list given to you today.  *If you need a refill on your cardiac medications before your next appointment, please call your pharmacy*   Lab Work: TODAY:  BMET  If you have labs (blood work) drawn today and your tests are completely normal, you will receive your results only by: [place] (if you have MyChart) OR A paper copy in the mail If you have any lab test that is abnormal or we need to change your treatment, we will call you to review the results.   Testing/Procedures: None orderd   Follow-Up: At [facility], you and your health needs are our priority.  As part of our continuing mission to provide you with exceptional heart care, we have created designated Provider Care Teams.  These Care Teams include your primary Cardiologist (physician) and Advanced Practice Providers (APPs -  Physician Assistants and Nurse Practitioners) who all work together to provide you with the care you need, when you need it.  We recommend signing up for the patient portal called "MyChart".  Sign up information is provided on this After Visit Summary.  MyChart is used to connect with patients for Virtual Visits (Telemedicine).  Patients are able to view lab/test results, encounter notes, upcoming appointments, etc.  Non-urgent messages can be sent to your provider as well.   To learn more about what you can do with MyChart, go to [email or web address].    Your next appointment:   As scheduled    The format for your next appointment:   In Person  Provider:   Sherren Mocha, MD  or [name], PA-C         Other Instructions   Important Information About Sugar

## 2022-01-05 LAB — BASIC METABOLIC PANEL
BUN/Creatinine Ratio: 25 — ABNORMAL HIGH (ref 10–24)
BUN: 30 mg/dL (ref 10–36)
CO2: 24 mmol/L (ref 20–29)
Calcium: 9.1 mg/dL (ref 8.6–10.2)
Chloride: 105 mmol/L (ref 96–106)
Creatinine, Ser: 1.21 mg/dL (ref 0.76–1.27)
Glucose: 88 mg/dL (ref 70–99)
Potassium: 4.8 mmol/L (ref 3.5–5.2)
Sodium: 142 mmol/L (ref 134–144)
eGFR: 56 mL/min/{1.73_m2} — ABNORMAL LOW (ref >59)

## 2022-01-07 NOTE — Progress Notes (Signed)
  Subjective:  Patient ID: Tyler Williams, male    DOB: 1926-09-19,  MRN: [id number]  Tyler Williams presents to clinic today for painful elongated mycotic toenails 1-5 bilaterally which are tender when wearing enclosed shoe gear. Pain is relieved with periodic professional debridement.  Chief Complaint  Patient presents with   Nail Problem    Routine foot care PCP-[facility] PCP VST-Last week   New problem(s): None.   His son is present during today's visit.  PCP is Burnard Bunting, MD.  No Known Allergies  Review of Systems: Negative except as noted in the HPI.  Objective:   Tyler Williams is a pleasant 88 y.o. male WD, WN in NAD. AAO x 3.  Vascular Examination: Vascular status intact b/l with palpable pedal pulses. Pedal hair absent b/l. CFT <3 seconds. No edema. No pain with calf compression b/l. Skin temperature gradient WNL b/l.   Neurological Examination: Sensation grossly intact b/l with 10 gram monofilament. Vibratory sensation intact b/l.   Dermatological Examination: Pedal skin with normal turgor, texture and tone b/l. Toenails 1-5 b/l thick, discolored, elongated with subungual debris and pain on dorsal palpation. No hyperkeratotic lesions noted b/l.   Musculoskeletal Examination: Muscle strength 5/5 to b/l LE. 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 Utilizes rollator for ambulation assistance. Wearing Kiziks sneakers on today's visit.  Radiographs: None  Assessment/Plan: 1. Pain due to onychomycosis of toenails of both feet     No orders of the defined types were placed in this encounter.   -Patient's family member present. All questions/concerns addressed on today's visit. -Continue supportive shoe gear daily. -Mycotic toenails 1-5 bilaterally were debrided in length and girth with sterile nail nippers and dremel without incident. -Patient/POA to call should there be question/concern in the interim.   Return in  about 3 months (around 04/04/2022).  Marzetta Board, DPM

## 2022-01-09 DIAGNOSIS — L97821 Non-pressure chronic ulcer of other part of left lower leg limited to breakdown of skin: Secondary | ICD-10-CM | POA: Diagnosis not present

## 2022-01-09 DIAGNOSIS — N1831 Chronic kidney disease, stage 3a: Secondary | ICD-10-CM | POA: Diagnosis not present

## 2022-01-09 DIAGNOSIS — I872 Venous insufficiency (chronic) (peripheral): Secondary | ICD-10-CM | POA: Diagnosis not present

## 2022-01-09 DIAGNOSIS — I5042 Chronic combined systolic (congestive) and diastolic (congestive) heart failure: Secondary | ICD-10-CM | POA: Diagnosis not present

## 2022-01-09 DIAGNOSIS — I130 Hypertensive heart and chronic kidney disease with heart failure and stage 1 through stage 4 chronic kidney disease, or unspecified chronic kidney disease: Secondary | ICD-10-CM | POA: Diagnosis not present

## 2022-01-09 DIAGNOSIS — I2510 Atherosclerotic heart disease of native coronary artery without angina pectoris: Secondary | ICD-10-CM | POA: Diagnosis not present

## 2022-01-10 ENCOUNTER — Telehealth: Payer: Self-pay

## 2022-01-10 DIAGNOSIS — I130 Hypertensive heart and chronic kidney disease with heart failure and stage 1 through stage 4 chronic kidney disease, or unspecified chronic kidney disease: Secondary | ICD-10-CM | POA: Diagnosis not present

## 2022-01-10 DIAGNOSIS — I2510 Atherosclerotic heart disease of native coronary artery without angina pectoris: Secondary | ICD-10-CM | POA: Diagnosis not present

## 2022-01-10 DIAGNOSIS — I5042 Chronic combined systolic (congestive) and diastolic (congestive) heart failure: Secondary | ICD-10-CM | POA: Diagnosis not present

## 2022-01-10 DIAGNOSIS — N1831 Chronic kidney disease, stage 3a: Secondary | ICD-10-CM | POA: Diagnosis not present

## 2022-01-10 DIAGNOSIS — I872 Venous insufficiency (chronic) (peripheral): Secondary | ICD-10-CM | POA: Diagnosis not present

## 2022-01-10 DIAGNOSIS — L97821 Non-pressure chronic ulcer of other part of left lower leg limited to breakdown of skin: Secondary | ICD-10-CM | POA: Diagnosis not present

## 2022-01-10 NOTE — Telephone Encounter (Signed)
        Patient  visited [place] on 11/19    Telephone encounter attempt :  1st  A HIPAA compliant voice message was left requesting a return call.  Instructed patient to call back    [place], [facility]  [phone] [address], [place], [place] [address] Phone: [phone] Email: [email or web address]

## 2022-01-11 ENCOUNTER — Telehealth: Payer: Self-pay

## 2022-01-11 NOTE — Telephone Encounter (Signed)
        Patient  visited [place] on 11/19    Telephone encounter attempt :  1st  A HIPAA compliant voice message was left requesting a return call.  Instructed patient to call back    [place], [facility] [employer]  [phone] [address], [place], [place] [address] Phone: [phone] Email: [email or web address]

## 2022-01-12 DIAGNOSIS — L97821 Non-pressure chronic ulcer of other part of left lower leg limited to breakdown of skin: Secondary | ICD-10-CM | POA: Diagnosis not present

## 2022-01-12 DIAGNOSIS — N1831 Chronic kidney disease, stage 3a: Secondary | ICD-10-CM | POA: Diagnosis not present

## 2022-01-12 DIAGNOSIS — I5042 Chronic combined systolic (congestive) and diastolic (congestive) heart failure: Secondary | ICD-10-CM | POA: Diagnosis not present

## 2022-01-12 DIAGNOSIS — I872 Venous insufficiency (chronic) (peripheral): Secondary | ICD-10-CM | POA: Diagnosis not present

## 2022-01-12 DIAGNOSIS — I130 Hypertensive heart and chronic kidney disease with heart failure and stage 1 through stage 4 chronic kidney disease, or unspecified chronic kidney disease: Secondary | ICD-10-CM | POA: Diagnosis not present

## 2022-01-12 DIAGNOSIS — I2510 Atherosclerotic heart disease of native coronary artery without angina pectoris: Secondary | ICD-10-CM | POA: Diagnosis not present

## 2022-01-17 DIAGNOSIS — I2510 Atherosclerotic heart disease of native coronary artery without angina pectoris: Secondary | ICD-10-CM | POA: Diagnosis not present

## 2022-01-17 DIAGNOSIS — N1831 Chronic kidney disease, stage 3a: Secondary | ICD-10-CM | POA: Diagnosis not present

## 2022-01-17 DIAGNOSIS — I872 Venous insufficiency (chronic) (peripheral): Secondary | ICD-10-CM | POA: Diagnosis not present

## 2022-01-17 DIAGNOSIS — L97821 Non-pressure chronic ulcer of other part of left lower leg limited to breakdown of skin: Secondary | ICD-10-CM | POA: Diagnosis not present

## 2022-01-17 DIAGNOSIS — I5042 Chronic combined systolic (congestive) and diastolic (congestive) heart failure: Secondary | ICD-10-CM | POA: Diagnosis not present

## 2022-01-17 DIAGNOSIS — I130 Hypertensive heart and chronic kidney disease with heart failure and stage 1 through stage 4 chronic kidney disease, or unspecified chronic kidney disease: Secondary | ICD-10-CM | POA: Diagnosis not present

## 2022-01-19 DIAGNOSIS — I872 Venous insufficiency (chronic) (peripheral): Secondary | ICD-10-CM | POA: Diagnosis not present

## 2022-01-19 DIAGNOSIS — I5042 Chronic combined systolic (congestive) and diastolic (congestive) heart failure: Secondary | ICD-10-CM | POA: Diagnosis not present

## 2022-01-19 DIAGNOSIS — I130 Hypertensive heart and chronic kidney disease with heart failure and stage 1 through stage 4 chronic kidney disease, or unspecified chronic kidney disease: Secondary | ICD-10-CM | POA: Diagnosis not present

## 2022-01-19 DIAGNOSIS — L97821 Non-pressure chronic ulcer of other part of left lower leg limited to breakdown of skin: Secondary | ICD-10-CM | POA: Diagnosis not present

## 2022-01-19 DIAGNOSIS — N1831 Chronic kidney disease, stage 3a: Secondary | ICD-10-CM | POA: Diagnosis not present

## 2022-01-19 DIAGNOSIS — I2510 Atherosclerotic heart disease of native coronary artery without angina pectoris: Secondary | ICD-10-CM | POA: Diagnosis not present

## 2022-01-23 DIAGNOSIS — I130 Hypertensive heart and chronic kidney disease with heart failure and stage 1 through stage 4 chronic kidney disease, or unspecified chronic kidney disease: Secondary | ICD-10-CM | POA: Diagnosis not present

## 2022-01-23 DIAGNOSIS — L97821 Non-pressure chronic ulcer of other part of left lower leg limited to breakdown of skin: Secondary | ICD-10-CM | POA: Diagnosis not present

## 2022-01-23 DIAGNOSIS — I2510 Atherosclerotic heart disease of native coronary artery without angina pectoris: Secondary | ICD-10-CM | POA: Diagnosis not present

## 2022-01-23 DIAGNOSIS — I5042 Chronic combined systolic (congestive) and diastolic (congestive) heart failure: Secondary | ICD-10-CM | POA: Diagnosis not present

## 2022-01-23 DIAGNOSIS — I872 Venous insufficiency (chronic) (peripheral): Secondary | ICD-10-CM | POA: Diagnosis not present

## 2022-01-23 DIAGNOSIS — N1831 Chronic kidney disease, stage 3a: Secondary | ICD-10-CM | POA: Diagnosis not present

## 2022-01-26 DIAGNOSIS — N1831 Chronic kidney disease, stage 3a: Secondary | ICD-10-CM | POA: Diagnosis not present

## 2022-01-26 DIAGNOSIS — I130 Hypertensive heart and chronic kidney disease with heart failure and stage 1 through stage 4 chronic kidney disease, or unspecified chronic kidney disease: Secondary | ICD-10-CM | POA: Diagnosis not present

## 2022-01-26 DIAGNOSIS — I5042 Chronic combined systolic (congestive) and diastolic (congestive) heart failure: Secondary | ICD-10-CM | POA: Diagnosis not present

## 2022-01-26 DIAGNOSIS — I872 Venous insufficiency (chronic) (peripheral): Secondary | ICD-10-CM | POA: Diagnosis not present

## 2022-01-26 DIAGNOSIS — I2510 Atherosclerotic heart disease of native coronary artery without angina pectoris: Secondary | ICD-10-CM | POA: Diagnosis not present

## 2022-01-26 DIAGNOSIS — L97821 Non-pressure chronic ulcer of other part of left lower leg limited to breakdown of skin: Secondary | ICD-10-CM | POA: Diagnosis not present

## 2022-01-28 DIAGNOSIS — M1990 Unspecified osteoarthritis, unspecified site: Secondary | ICD-10-CM | POA: Diagnosis not present

## 2022-01-28 DIAGNOSIS — L97821 Non-pressure chronic ulcer of other part of left lower leg limited to breakdown of skin: Secondary | ICD-10-CM | POA: Diagnosis not present

## 2022-01-28 DIAGNOSIS — F419 Anxiety disorder, unspecified: Secondary | ICD-10-CM | POA: Diagnosis not present

## 2022-01-28 DIAGNOSIS — I420 Dilated cardiomyopathy: Secondary | ICD-10-CM | POA: Diagnosis not present

## 2022-01-28 DIAGNOSIS — I4821 Permanent atrial fibrillation: Secondary | ICD-10-CM | POA: Diagnosis not present

## 2022-01-28 DIAGNOSIS — H9190 Unspecified hearing loss, unspecified ear: Secondary | ICD-10-CM | POA: Diagnosis not present

## 2022-01-28 DIAGNOSIS — N401 Enlarged prostate with lower urinary tract symptoms: Secondary | ICD-10-CM | POA: Diagnosis not present

## 2022-01-28 DIAGNOSIS — D631 Anemia in chronic kidney disease: Secondary | ICD-10-CM | POA: Diagnosis not present

## 2022-01-28 DIAGNOSIS — I130 Hypertensive heart and chronic kidney disease with heart failure and stage 1 through stage 4 chronic kidney disease, or unspecified chronic kidney disease: Secondary | ICD-10-CM | POA: Diagnosis not present

## 2022-01-28 DIAGNOSIS — K219 Gastro-esophageal reflux disease without esophagitis: Secondary | ICD-10-CM | POA: Diagnosis not present

## 2022-01-28 DIAGNOSIS — I5043 Acute on chronic combined systolic (congestive) and diastolic (congestive) heart failure: Secondary | ICD-10-CM | POA: Diagnosis not present

## 2022-01-28 DIAGNOSIS — I959 Hypotension, unspecified: Secondary | ICD-10-CM | POA: Diagnosis not present

## 2022-01-28 DIAGNOSIS — I872 Venous insufficiency (chronic) (peripheral): Secondary | ICD-10-CM | POA: Diagnosis not present

## 2022-01-28 DIAGNOSIS — I4510 Unspecified right bundle-branch block: Secondary | ICD-10-CM | POA: Diagnosis not present

## 2022-01-28 DIAGNOSIS — E785 Hyperlipidemia, unspecified: Secondary | ICD-10-CM | POA: Diagnosis not present

## 2022-01-28 DIAGNOSIS — K7460 Unspecified cirrhosis of liver: Secondary | ICD-10-CM | POA: Diagnosis not present

## 2022-01-28 DIAGNOSIS — I495 Sick sinus syndrome: Secondary | ICD-10-CM | POA: Diagnosis not present

## 2022-01-28 DIAGNOSIS — N39498 Other specified urinary incontinence: Secondary | ICD-10-CM | POA: Diagnosis not present

## 2022-01-28 DIAGNOSIS — I2510 Atherosclerotic heart disease of native coronary artery without angina pectoris: Secondary | ICD-10-CM | POA: Diagnosis not present

## 2022-01-28 DIAGNOSIS — H3530 Unspecified macular degeneration: Secondary | ICD-10-CM | POA: Diagnosis not present

## 2022-01-28 DIAGNOSIS — I272 Pulmonary hypertension, unspecified: Secondary | ICD-10-CM | POA: Diagnosis not present

## 2022-01-28 DIAGNOSIS — N1831 Chronic kidney disease, stage 3a: Secondary | ICD-10-CM | POA: Diagnosis not present

## 2022-01-28 DIAGNOSIS — D696 Thrombocytopenia, unspecified: Secondary | ICD-10-CM | POA: Diagnosis not present

## 2022-01-28 DIAGNOSIS — Z7901 Long term (current) use of anticoagulants: Secondary | ICD-10-CM | POA: Diagnosis not present

## 2022-01-28 DIAGNOSIS — I739 Peripheral vascular disease, unspecified: Secondary | ICD-10-CM | POA: Diagnosis not present

## 2022-02-02 DIAGNOSIS — Z961 Presence of intraocular lens: Secondary | ICD-10-CM | POA: Diagnosis not present

## 2022-02-02 DIAGNOSIS — H353231 Exudative age-related macular degeneration, bilateral, with active choroidal neovascularization: Secondary | ICD-10-CM | POA: Diagnosis not present

## 2022-02-02 DIAGNOSIS — D631 Anemia in chronic kidney disease: Secondary | ICD-10-CM | POA: Diagnosis not present

## 2022-02-02 DIAGNOSIS — I872 Venous insufficiency (chronic) (peripheral): Secondary | ICD-10-CM | POA: Diagnosis not present

## 2022-02-02 DIAGNOSIS — L97821 Non-pressure chronic ulcer of other part of left lower leg limited to breakdown of skin: Secondary | ICD-10-CM | POA: Diagnosis not present

## 2022-02-02 DIAGNOSIS — I5043 Acute on chronic combined systolic (congestive) and diastolic (congestive) heart failure: Secondary | ICD-10-CM | POA: Diagnosis not present

## 2022-02-02 DIAGNOSIS — N1831 Chronic kidney disease, stage 3a: Secondary | ICD-10-CM | POA: Diagnosis not present

## 2022-02-02 DIAGNOSIS — I130 Hypertensive heart and chronic kidney disease with heart failure and stage 1 through stage 4 chronic kidney disease, or unspecified chronic kidney disease: Secondary | ICD-10-CM | POA: Diagnosis not present

## 2022-02-03 DIAGNOSIS — I872 Venous insufficiency (chronic) (peripheral): Secondary | ICD-10-CM | POA: Diagnosis not present

## 2022-02-03 DIAGNOSIS — I130 Hypertensive heart and chronic kidney disease with heart failure and stage 1 through stage 4 chronic kidney disease, or unspecified chronic kidney disease: Secondary | ICD-10-CM | POA: Diagnosis not present

## 2022-02-03 DIAGNOSIS — N1831 Chronic kidney disease, stage 3a: Secondary | ICD-10-CM | POA: Diagnosis not present

## 2022-02-03 DIAGNOSIS — I5043 Acute on chronic combined systolic (congestive) and diastolic (congestive) heart failure: Secondary | ICD-10-CM | POA: Diagnosis not present

## 2022-02-03 DIAGNOSIS — D631 Anemia in chronic kidney disease: Secondary | ICD-10-CM | POA: Diagnosis not present

## 2022-02-03 DIAGNOSIS — L97821 Non-pressure chronic ulcer of other part of left lower leg limited to breakdown of skin: Secondary | ICD-10-CM | POA: Diagnosis not present

## 2022-02-07 DIAGNOSIS — N1831 Chronic kidney disease, stage 3a: Secondary | ICD-10-CM | POA: Diagnosis not present

## 2022-02-07 DIAGNOSIS — D631 Anemia in chronic kidney disease: Secondary | ICD-10-CM | POA: Diagnosis not present

## 2022-02-07 DIAGNOSIS — I5043 Acute on chronic combined systolic (congestive) and diastolic (congestive) heart failure: Secondary | ICD-10-CM | POA: Diagnosis not present

## 2022-02-07 DIAGNOSIS — I872 Venous insufficiency (chronic) (peripheral): Secondary | ICD-10-CM | POA: Diagnosis not present

## 2022-02-07 DIAGNOSIS — I130 Hypertensive heart and chronic kidney disease with heart failure and stage 1 through stage 4 chronic kidney disease, or unspecified chronic kidney disease: Secondary | ICD-10-CM | POA: Diagnosis not present

## 2022-02-07 DIAGNOSIS — L97821 Non-pressure chronic ulcer of other part of left lower leg limited to breakdown of skin: Secondary | ICD-10-CM | POA: Diagnosis not present

## 2022-02-09 DIAGNOSIS — I5043 Acute on chronic combined systolic (congestive) and diastolic (congestive) heart failure: Secondary | ICD-10-CM | POA: Diagnosis not present

## 2022-02-09 DIAGNOSIS — N1831 Chronic kidney disease, stage 3a: Secondary | ICD-10-CM | POA: Diagnosis not present

## 2022-02-09 DIAGNOSIS — D631 Anemia in chronic kidney disease: Secondary | ICD-10-CM | POA: Diagnosis not present

## 2022-02-09 DIAGNOSIS — L97821 Non-pressure chronic ulcer of other part of left lower leg limited to breakdown of skin: Secondary | ICD-10-CM | POA: Diagnosis not present

## 2022-02-09 DIAGNOSIS — I130 Hypertensive heart and chronic kidney disease with heart failure and stage 1 through stage 4 chronic kidney disease, or unspecified chronic kidney disease: Secondary | ICD-10-CM | POA: Diagnosis not present

## 2022-02-09 DIAGNOSIS — I872 Venous insufficiency (chronic) (peripheral): Secondary | ICD-10-CM | POA: Diagnosis not present

## 2022-02-13 DIAGNOSIS — L97821 Non-pressure chronic ulcer of other part of left lower leg limited to breakdown of skin: Secondary | ICD-10-CM | POA: Diagnosis not present

## 2022-02-13 DIAGNOSIS — I872 Venous insufficiency (chronic) (peripheral): Secondary | ICD-10-CM | POA: Diagnosis not present

## 2022-02-13 DIAGNOSIS — N1831 Chronic kidney disease, stage 3a: Secondary | ICD-10-CM | POA: Diagnosis not present

## 2022-02-13 DIAGNOSIS — I5043 Acute on chronic combined systolic (congestive) and diastolic (congestive) heart failure: Secondary | ICD-10-CM | POA: Diagnosis not present

## 2022-02-13 DIAGNOSIS — I130 Hypertensive heart and chronic kidney disease with heart failure and stage 1 through stage 4 chronic kidney disease, or unspecified chronic kidney disease: Secondary | ICD-10-CM | POA: Diagnosis not present

## 2022-02-13 DIAGNOSIS — D631 Anemia in chronic kidney disease: Secondary | ICD-10-CM | POA: Diagnosis not present

## 2022-02-14 DIAGNOSIS — I5043 Acute on chronic combined systolic (congestive) and diastolic (congestive) heart failure: Secondary | ICD-10-CM | POA: Diagnosis not present

## 2022-02-14 DIAGNOSIS — L97821 Non-pressure chronic ulcer of other part of left lower leg limited to breakdown of skin: Secondary | ICD-10-CM | POA: Diagnosis not present

## 2022-02-14 DIAGNOSIS — I130 Hypertensive heart and chronic kidney disease with heart failure and stage 1 through stage 4 chronic kidney disease, or unspecified chronic kidney disease: Secondary | ICD-10-CM | POA: Diagnosis not present

## 2022-02-14 DIAGNOSIS — I872 Venous insufficiency (chronic) (peripheral): Secondary | ICD-10-CM | POA: Diagnosis not present

## 2022-02-14 DIAGNOSIS — D631 Anemia in chronic kidney disease: Secondary | ICD-10-CM | POA: Diagnosis not present

## 2022-02-14 DIAGNOSIS — N1831 Chronic kidney disease, stage 3a: Secondary | ICD-10-CM | POA: Diagnosis not present

## 2022-02-16 DIAGNOSIS — L97821 Non-pressure chronic ulcer of other part of left lower leg limited to breakdown of skin: Secondary | ICD-10-CM | POA: Diagnosis not present

## 2022-02-16 DIAGNOSIS — I130 Hypertensive heart and chronic kidney disease with heart failure and stage 1 through stage 4 chronic kidney disease, or unspecified chronic kidney disease: Secondary | ICD-10-CM | POA: Diagnosis not present

## 2022-02-16 DIAGNOSIS — I5043 Acute on chronic combined systolic (congestive) and diastolic (congestive) heart failure: Secondary | ICD-10-CM | POA: Diagnosis not present

## 2022-02-16 DIAGNOSIS — N1831 Chronic kidney disease, stage 3a: Secondary | ICD-10-CM | POA: Diagnosis not present

## 2022-02-16 DIAGNOSIS — D631 Anemia in chronic kidney disease: Secondary | ICD-10-CM | POA: Diagnosis not present

## 2022-02-16 DIAGNOSIS — I872 Venous insufficiency (chronic) (peripheral): Secondary | ICD-10-CM | POA: Diagnosis not present

## 2022-02-21 DIAGNOSIS — I872 Venous insufficiency (chronic) (peripheral): Secondary | ICD-10-CM | POA: Diagnosis not present

## 2022-02-21 DIAGNOSIS — I130 Hypertensive heart and chronic kidney disease with heart failure and stage 1 through stage 4 chronic kidney disease, or unspecified chronic kidney disease: Secondary | ICD-10-CM | POA: Diagnosis not present

## 2022-02-21 DIAGNOSIS — D631 Anemia in chronic kidney disease: Secondary | ICD-10-CM | POA: Diagnosis not present

## 2022-02-21 DIAGNOSIS — N1831 Chronic kidney disease, stage 3a: Secondary | ICD-10-CM | POA: Diagnosis not present

## 2022-02-21 DIAGNOSIS — I5043 Acute on chronic combined systolic (congestive) and diastolic (congestive) heart failure: Secondary | ICD-10-CM | POA: Diagnosis not present

## 2022-02-21 DIAGNOSIS — L97821 Non-pressure chronic ulcer of other part of left lower leg limited to breakdown of skin: Secondary | ICD-10-CM | POA: Diagnosis not present

## 2022-02-22 DIAGNOSIS — L821 Other seborrheic keratosis: Secondary | ICD-10-CM | POA: Diagnosis not present

## 2022-02-22 DIAGNOSIS — C44329 Squamous cell carcinoma of skin of other parts of face: Secondary | ICD-10-CM | POA: Diagnosis not present

## 2022-02-22 DIAGNOSIS — L814 Other melanin hyperpigmentation: Secondary | ICD-10-CM | POA: Diagnosis not present

## 2022-02-22 DIAGNOSIS — L570 Actinic keratosis: Secondary | ICD-10-CM | POA: Diagnosis not present

## 2022-02-22 DIAGNOSIS — Z85828 Personal history of other malignant neoplasm of skin: Secondary | ICD-10-CM | POA: Diagnosis not present

## 2022-02-23 DIAGNOSIS — N1831 Chronic kidney disease, stage 3a: Secondary | ICD-10-CM | POA: Diagnosis not present

## 2022-02-23 DIAGNOSIS — D631 Anemia in chronic kidney disease: Secondary | ICD-10-CM | POA: Diagnosis not present

## 2022-02-23 DIAGNOSIS — I5043 Acute on chronic combined systolic (congestive) and diastolic (congestive) heart failure: Secondary | ICD-10-CM | POA: Diagnosis not present

## 2022-02-23 DIAGNOSIS — I872 Venous insufficiency (chronic) (peripheral): Secondary | ICD-10-CM | POA: Diagnosis not present

## 2022-02-23 DIAGNOSIS — I130 Hypertensive heart and chronic kidney disease with heart failure and stage 1 through stage 4 chronic kidney disease, or unspecified chronic kidney disease: Secondary | ICD-10-CM | POA: Diagnosis not present

## 2022-02-23 DIAGNOSIS — L97821 Non-pressure chronic ulcer of other part of left lower leg limited to breakdown of skin: Secondary | ICD-10-CM | POA: Diagnosis not present

## 2022-02-27 DIAGNOSIS — N39498 Other specified urinary incontinence: Secondary | ICD-10-CM | POA: Diagnosis not present

## 2022-02-27 DIAGNOSIS — L97821 Non-pressure chronic ulcer of other part of left lower leg limited to breakdown of skin: Secondary | ICD-10-CM | POA: Diagnosis not present

## 2022-02-27 DIAGNOSIS — I4510 Unspecified right bundle-branch block: Secondary | ICD-10-CM | POA: Diagnosis not present

## 2022-02-27 DIAGNOSIS — I272 Pulmonary hypertension, unspecified: Secondary | ICD-10-CM | POA: Diagnosis not present

## 2022-02-27 DIAGNOSIS — I495 Sick sinus syndrome: Secondary | ICD-10-CM | POA: Diagnosis not present

## 2022-02-27 DIAGNOSIS — I2510 Atherosclerotic heart disease of native coronary artery without angina pectoris: Secondary | ICD-10-CM | POA: Diagnosis not present

## 2022-02-27 DIAGNOSIS — D631 Anemia in chronic kidney disease: Secondary | ICD-10-CM | POA: Diagnosis not present

## 2022-02-27 DIAGNOSIS — H3530 Unspecified macular degeneration: Secondary | ICD-10-CM | POA: Diagnosis not present

## 2022-02-27 DIAGNOSIS — N1831 Chronic kidney disease, stage 3a: Secondary | ICD-10-CM | POA: Diagnosis not present

## 2022-02-27 DIAGNOSIS — I959 Hypotension, unspecified: Secondary | ICD-10-CM | POA: Diagnosis not present

## 2022-02-27 DIAGNOSIS — M1990 Unspecified osteoarthritis, unspecified site: Secondary | ICD-10-CM | POA: Diagnosis not present

## 2022-02-27 DIAGNOSIS — D696 Thrombocytopenia, unspecified: Secondary | ICD-10-CM | POA: Diagnosis not present

## 2022-02-27 DIAGNOSIS — I739 Peripheral vascular disease, unspecified: Secondary | ICD-10-CM | POA: Diagnosis not present

## 2022-02-27 DIAGNOSIS — I4821 Permanent atrial fibrillation: Secondary | ICD-10-CM | POA: Diagnosis not present

## 2022-02-27 DIAGNOSIS — I5043 Acute on chronic combined systolic (congestive) and diastolic (congestive) heart failure: Secondary | ICD-10-CM | POA: Diagnosis not present

## 2022-02-27 DIAGNOSIS — E785 Hyperlipidemia, unspecified: Secondary | ICD-10-CM | POA: Diagnosis not present

## 2022-02-27 DIAGNOSIS — Z7901 Long term (current) use of anticoagulants: Secondary | ICD-10-CM | POA: Diagnosis not present

## 2022-02-27 DIAGNOSIS — F419 Anxiety disorder, unspecified: Secondary | ICD-10-CM | POA: Diagnosis not present

## 2022-02-27 DIAGNOSIS — K219 Gastro-esophageal reflux disease without esophagitis: Secondary | ICD-10-CM | POA: Diagnosis not present

## 2022-02-27 DIAGNOSIS — I872 Venous insufficiency (chronic) (peripheral): Secondary | ICD-10-CM | POA: Diagnosis not present

## 2022-02-27 DIAGNOSIS — H9190 Unspecified hearing loss, unspecified ear: Secondary | ICD-10-CM | POA: Diagnosis not present

## 2022-02-27 DIAGNOSIS — K7460 Unspecified cirrhosis of liver: Secondary | ICD-10-CM | POA: Diagnosis not present

## 2022-02-27 DIAGNOSIS — N401 Enlarged prostate with lower urinary tract symptoms: Secondary | ICD-10-CM | POA: Diagnosis not present

## 2022-02-27 DIAGNOSIS — I420 Dilated cardiomyopathy: Secondary | ICD-10-CM | POA: Diagnosis not present

## 2022-02-27 DIAGNOSIS — I130 Hypertensive heart and chronic kidney disease with heart failure and stage 1 through stage 4 chronic kidney disease, or unspecified chronic kidney disease: Secondary | ICD-10-CM | POA: Diagnosis not present

## 2022-02-28 DIAGNOSIS — I130 Hypertensive heart and chronic kidney disease with heart failure and stage 1 through stage 4 chronic kidney disease, or unspecified chronic kidney disease: Secondary | ICD-10-CM | POA: Diagnosis not present

## 2022-02-28 DIAGNOSIS — D631 Anemia in chronic kidney disease: Secondary | ICD-10-CM | POA: Diagnosis not present

## 2022-02-28 DIAGNOSIS — N1831 Chronic kidney disease, stage 3a: Secondary | ICD-10-CM | POA: Diagnosis not present

## 2022-02-28 DIAGNOSIS — I5043 Acute on chronic combined systolic (congestive) and diastolic (congestive) heart failure: Secondary | ICD-10-CM | POA: Diagnosis not present

## 2022-02-28 DIAGNOSIS — I872 Venous insufficiency (chronic) (peripheral): Secondary | ICD-10-CM | POA: Diagnosis not present

## 2022-02-28 DIAGNOSIS — L97821 Non-pressure chronic ulcer of other part of left lower leg limited to breakdown of skin: Secondary | ICD-10-CM | POA: Diagnosis not present

## 2022-03-01 DIAGNOSIS — I5043 Acute on chronic combined systolic (congestive) and diastolic (congestive) heart failure: Secondary | ICD-10-CM | POA: Diagnosis not present

## 2022-03-01 DIAGNOSIS — L97821 Non-pressure chronic ulcer of other part of left lower leg limited to breakdown of skin: Secondary | ICD-10-CM | POA: Diagnosis not present

## 2022-03-01 DIAGNOSIS — D631 Anemia in chronic kidney disease: Secondary | ICD-10-CM | POA: Diagnosis not present

## 2022-03-01 DIAGNOSIS — N1831 Chronic kidney disease, stage 3a: Secondary | ICD-10-CM | POA: Diagnosis not present

## 2022-03-01 DIAGNOSIS — I130 Hypertensive heart and chronic kidney disease with heart failure and stage 1 through stage 4 chronic kidney disease, or unspecified chronic kidney disease: Secondary | ICD-10-CM | POA: Diagnosis not present

## 2022-03-01 DIAGNOSIS — I872 Venous insufficiency (chronic) (peripheral): Secondary | ICD-10-CM | POA: Diagnosis not present

## 2022-03-06 DIAGNOSIS — I872 Venous insufficiency (chronic) (peripheral): Secondary | ICD-10-CM | POA: Diagnosis not present

## 2022-03-06 DIAGNOSIS — I5043 Acute on chronic combined systolic (congestive) and diastolic (congestive) heart failure: Secondary | ICD-10-CM | POA: Diagnosis not present

## 2022-03-06 DIAGNOSIS — N1831 Chronic kidney disease, stage 3a: Secondary | ICD-10-CM | POA: Diagnosis not present

## 2022-03-06 DIAGNOSIS — D631 Anemia in chronic kidney disease: Secondary | ICD-10-CM | POA: Diagnosis not present

## 2022-03-06 DIAGNOSIS — I130 Hypertensive heart and chronic kidney disease with heart failure and stage 1 through stage 4 chronic kidney disease, or unspecified chronic kidney disease: Secondary | ICD-10-CM | POA: Diagnosis not present

## 2022-03-06 DIAGNOSIS — L97821 Non-pressure chronic ulcer of other part of left lower leg limited to breakdown of skin: Secondary | ICD-10-CM | POA: Diagnosis not present

## 2022-03-09 DIAGNOSIS — D631 Anemia in chronic kidney disease: Secondary | ICD-10-CM | POA: Diagnosis not present

## 2022-03-09 DIAGNOSIS — N1831 Chronic kidney disease, stage 3a: Secondary | ICD-10-CM | POA: Diagnosis not present

## 2022-03-09 DIAGNOSIS — I5043 Acute on chronic combined systolic (congestive) and diastolic (congestive) heart failure: Secondary | ICD-10-CM | POA: Diagnosis not present

## 2022-03-09 DIAGNOSIS — I872 Venous insufficiency (chronic) (peripheral): Secondary | ICD-10-CM | POA: Diagnosis not present

## 2022-03-09 DIAGNOSIS — L97821 Non-pressure chronic ulcer of other part of left lower leg limited to breakdown of skin: Secondary | ICD-10-CM | POA: Diagnosis not present

## 2022-03-09 DIAGNOSIS — I130 Hypertensive heart and chronic kidney disease with heart failure and stage 1 through stage 4 chronic kidney disease, or unspecified chronic kidney disease: Secondary | ICD-10-CM | POA: Diagnosis not present

## 2022-03-10 DIAGNOSIS — I130 Hypertensive heart and chronic kidney disease with heart failure and stage 1 through stage 4 chronic kidney disease, or unspecified chronic kidney disease: Secondary | ICD-10-CM | POA: Diagnosis not present

## 2022-03-10 DIAGNOSIS — I87332 Chronic venous hypertension (idiopathic) with ulcer and inflammation of left lower extremity: Secondary | ICD-10-CM | POA: Diagnosis not present

## 2022-03-10 DIAGNOSIS — I739 Peripheral vascular disease, unspecified: Secondary | ICD-10-CM | POA: Diagnosis not present

## 2022-03-10 DIAGNOSIS — I4821 Permanent atrial fibrillation: Secondary | ICD-10-CM | POA: Diagnosis not present

## 2022-03-13 DIAGNOSIS — D631 Anemia in chronic kidney disease: Secondary | ICD-10-CM | POA: Diagnosis not present

## 2022-03-13 DIAGNOSIS — N1831 Chronic kidney disease, stage 3a: Secondary | ICD-10-CM | POA: Diagnosis not present

## 2022-03-13 DIAGNOSIS — I130 Hypertensive heart and chronic kidney disease with heart failure and stage 1 through stage 4 chronic kidney disease, or unspecified chronic kidney disease: Secondary | ICD-10-CM | POA: Diagnosis not present

## 2022-03-13 DIAGNOSIS — L97821 Non-pressure chronic ulcer of other part of left lower leg limited to breakdown of skin: Secondary | ICD-10-CM | POA: Diagnosis not present

## 2022-03-13 DIAGNOSIS — I872 Venous insufficiency (chronic) (peripheral): Secondary | ICD-10-CM | POA: Diagnosis not present

## 2022-03-13 DIAGNOSIS — I5043 Acute on chronic combined systolic (congestive) and diastolic (congestive) heart failure: Secondary | ICD-10-CM | POA: Diagnosis not present

## 2022-03-16 DIAGNOSIS — I872 Venous insufficiency (chronic) (peripheral): Secondary | ICD-10-CM | POA: Diagnosis not present

## 2022-03-16 DIAGNOSIS — D631 Anemia in chronic kidney disease: Secondary | ICD-10-CM | POA: Diagnosis not present

## 2022-03-16 DIAGNOSIS — N1831 Chronic kidney disease, stage 3a: Secondary | ICD-10-CM | POA: Diagnosis not present

## 2022-03-16 DIAGNOSIS — I130 Hypertensive heart and chronic kidney disease with heart failure and stage 1 through stage 4 chronic kidney disease, or unspecified chronic kidney disease: Secondary | ICD-10-CM | POA: Diagnosis not present

## 2022-03-16 DIAGNOSIS — I5043 Acute on chronic combined systolic (congestive) and diastolic (congestive) heart failure: Secondary | ICD-10-CM | POA: Diagnosis not present

## 2022-03-16 DIAGNOSIS — L97821 Non-pressure chronic ulcer of other part of left lower leg limited to breakdown of skin: Secondary | ICD-10-CM | POA: Diagnosis not present

## 2022-03-17 DIAGNOSIS — I87332 Chronic venous hypertension (idiopathic) with ulcer and inflammation of left lower extremity: Secondary | ICD-10-CM | POA: Diagnosis not present

## 2022-03-17 DIAGNOSIS — I739 Peripheral vascular disease, unspecified: Secondary | ICD-10-CM | POA: Diagnosis not present

## 2022-03-17 DIAGNOSIS — M25572 Pain in left ankle and joints of left foot: Secondary | ICD-10-CM | POA: Diagnosis not present

## 2022-03-17 DIAGNOSIS — Z7901 Long term (current) use of anticoagulants: Secondary | ICD-10-CM | POA: Diagnosis not present

## 2022-03-17 DIAGNOSIS — I130 Hypertensive heart and chronic kidney disease with heart failure and stage 1 through stage 4 chronic kidney disease, or unspecified chronic kidney disease: Secondary | ICD-10-CM | POA: Diagnosis not present

## 2022-03-17 DIAGNOSIS — I4821 Permanent atrial fibrillation: Secondary | ICD-10-CM | POA: Diagnosis not present

## 2022-03-21 DIAGNOSIS — L0889 Other specified local infections of the skin and subcutaneous tissue: Secondary | ICD-10-CM | POA: Diagnosis not present

## 2022-03-21 DIAGNOSIS — I5043 Acute on chronic combined systolic (congestive) and diastolic (congestive) heart failure: Secondary | ICD-10-CM | POA: Diagnosis not present

## 2022-03-21 DIAGNOSIS — I130 Hypertensive heart and chronic kidney disease with heart failure and stage 1 through stage 4 chronic kidney disease, or unspecified chronic kidney disease: Secondary | ICD-10-CM | POA: Diagnosis not present

## 2022-03-21 DIAGNOSIS — N1831 Chronic kidney disease, stage 3a: Secondary | ICD-10-CM | POA: Diagnosis not present

## 2022-03-21 DIAGNOSIS — L98499 Non-pressure chronic ulcer of skin of other sites with unspecified severity: Secondary | ICD-10-CM | POA: Diagnosis not present

## 2022-03-21 DIAGNOSIS — Z85828 Personal history of other malignant neoplasm of skin: Secondary | ICD-10-CM | POA: Diagnosis not present

## 2022-03-21 DIAGNOSIS — I872 Venous insufficiency (chronic) (peripheral): Secondary | ICD-10-CM | POA: Diagnosis not present

## 2022-03-21 DIAGNOSIS — L97821 Non-pressure chronic ulcer of other part of left lower leg limited to breakdown of skin: Secondary | ICD-10-CM | POA: Diagnosis not present

## 2022-03-21 DIAGNOSIS — D631 Anemia in chronic kidney disease: Secondary | ICD-10-CM | POA: Diagnosis not present

## 2022-03-24 DIAGNOSIS — L97821 Non-pressure chronic ulcer of other part of left lower leg limited to breakdown of skin: Secondary | ICD-10-CM | POA: Diagnosis not present

## 2022-03-24 DIAGNOSIS — D631 Anemia in chronic kidney disease: Secondary | ICD-10-CM | POA: Diagnosis not present

## 2022-03-24 DIAGNOSIS — I5043 Acute on chronic combined systolic (congestive) and diastolic (congestive) heart failure: Secondary | ICD-10-CM | POA: Diagnosis not present

## 2022-03-24 DIAGNOSIS — I130 Hypertensive heart and chronic kidney disease with heart failure and stage 1 through stage 4 chronic kidney disease, or unspecified chronic kidney disease: Secondary | ICD-10-CM | POA: Diagnosis not present

## 2022-03-24 DIAGNOSIS — N1831 Chronic kidney disease, stage 3a: Secondary | ICD-10-CM | POA: Diagnosis not present

## 2022-03-24 DIAGNOSIS — I872 Venous insufficiency (chronic) (peripheral): Secondary | ICD-10-CM | POA: Diagnosis not present

## 2022-03-27 DIAGNOSIS — L97821 Non-pressure chronic ulcer of other part of left lower leg limited to breakdown of skin: Secondary | ICD-10-CM | POA: Diagnosis not present

## 2022-03-27 DIAGNOSIS — I5043 Acute on chronic combined systolic (congestive) and diastolic (congestive) heart failure: Secondary | ICD-10-CM | POA: Diagnosis not present

## 2022-03-27 DIAGNOSIS — I872 Venous insufficiency (chronic) (peripheral): Secondary | ICD-10-CM | POA: Diagnosis not present

## 2022-03-27 DIAGNOSIS — I130 Hypertensive heart and chronic kidney disease with heart failure and stage 1 through stage 4 chronic kidney disease, or unspecified chronic kidney disease: Secondary | ICD-10-CM | POA: Diagnosis not present

## 2022-03-27 DIAGNOSIS — N1831 Chronic kidney disease, stage 3a: Secondary | ICD-10-CM | POA: Diagnosis not present

## 2022-03-27 DIAGNOSIS — D631 Anemia in chronic kidney disease: Secondary | ICD-10-CM | POA: Diagnosis not present

## 2022-03-28 ENCOUNTER — Other Ambulatory Visit: Payer: Self-pay

## 2022-03-29 DIAGNOSIS — I420 Dilated cardiomyopathy: Secondary | ICD-10-CM | POA: Diagnosis not present

## 2022-03-29 DIAGNOSIS — I739 Peripheral vascular disease, unspecified: Secondary | ICD-10-CM | POA: Diagnosis not present

## 2022-03-29 DIAGNOSIS — N1831 Chronic kidney disease, stage 3a: Secondary | ICD-10-CM | POA: Diagnosis not present

## 2022-03-29 DIAGNOSIS — K219 Gastro-esophageal reflux disease without esophagitis: Secondary | ICD-10-CM | POA: Diagnosis not present

## 2022-03-29 DIAGNOSIS — I495 Sick sinus syndrome: Secondary | ICD-10-CM | POA: Diagnosis not present

## 2022-03-29 DIAGNOSIS — N39498 Other specified urinary incontinence: Secondary | ICD-10-CM | POA: Diagnosis not present

## 2022-03-29 DIAGNOSIS — H3530 Unspecified macular degeneration: Secondary | ICD-10-CM | POA: Diagnosis not present

## 2022-03-29 DIAGNOSIS — D631 Anemia in chronic kidney disease: Secondary | ICD-10-CM | POA: Diagnosis not present

## 2022-03-29 DIAGNOSIS — I4821 Permanent atrial fibrillation: Secondary | ICD-10-CM | POA: Diagnosis not present

## 2022-03-29 DIAGNOSIS — I872 Venous insufficiency (chronic) (peripheral): Secondary | ICD-10-CM | POA: Diagnosis not present

## 2022-03-29 DIAGNOSIS — I5042 Chronic combined systolic (congestive) and diastolic (congestive) heart failure: Secondary | ICD-10-CM | POA: Diagnosis not present

## 2022-03-29 DIAGNOSIS — D696 Thrombocytopenia, unspecified: Secondary | ICD-10-CM | POA: Diagnosis not present

## 2022-03-29 DIAGNOSIS — I272 Pulmonary hypertension, unspecified: Secondary | ICD-10-CM | POA: Diagnosis not present

## 2022-03-29 DIAGNOSIS — M1990 Unspecified osteoarthritis, unspecified site: Secondary | ICD-10-CM | POA: Diagnosis not present

## 2022-03-29 DIAGNOSIS — K7460 Unspecified cirrhosis of liver: Secondary | ICD-10-CM | POA: Diagnosis not present

## 2022-03-29 DIAGNOSIS — F419 Anxiety disorder, unspecified: Secondary | ICD-10-CM | POA: Diagnosis not present

## 2022-03-29 DIAGNOSIS — H9190 Unspecified hearing loss, unspecified ear: Secondary | ICD-10-CM | POA: Diagnosis not present

## 2022-03-29 DIAGNOSIS — E785 Hyperlipidemia, unspecified: Secondary | ICD-10-CM | POA: Diagnosis not present

## 2022-03-29 DIAGNOSIS — I959 Hypotension, unspecified: Secondary | ICD-10-CM | POA: Diagnosis not present

## 2022-03-29 DIAGNOSIS — I4510 Unspecified right bundle-branch block: Secondary | ICD-10-CM | POA: Diagnosis not present

## 2022-03-29 DIAGNOSIS — N401 Enlarged prostate with lower urinary tract symptoms: Secondary | ICD-10-CM | POA: Diagnosis not present

## 2022-03-29 DIAGNOSIS — I130 Hypertensive heart and chronic kidney disease with heart failure and stage 1 through stage 4 chronic kidney disease, or unspecified chronic kidney disease: Secondary | ICD-10-CM | POA: Diagnosis not present

## 2022-03-29 DIAGNOSIS — I2510 Atherosclerotic heart disease of native coronary artery without angina pectoris: Secondary | ICD-10-CM | POA: Diagnosis not present

## 2022-03-29 DIAGNOSIS — L97821 Non-pressure chronic ulcer of other part of left lower leg limited to breakdown of skin: Secondary | ICD-10-CM | POA: Diagnosis not present

## 2022-03-29 DIAGNOSIS — Z7901 Long term (current) use of anticoagulants: Secondary | ICD-10-CM | POA: Diagnosis not present

## 2022-03-30 DIAGNOSIS — I130 Hypertensive heart and chronic kidney disease with heart failure and stage 1 through stage 4 chronic kidney disease, or unspecified chronic kidney disease: Secondary | ICD-10-CM | POA: Diagnosis not present

## 2022-03-30 DIAGNOSIS — I872 Venous insufficiency (chronic) (peripheral): Secondary | ICD-10-CM | POA: Diagnosis not present

## 2022-03-30 DIAGNOSIS — L97821 Non-pressure chronic ulcer of other part of left lower leg limited to breakdown of skin: Secondary | ICD-10-CM | POA: Diagnosis not present

## 2022-03-30 DIAGNOSIS — D631 Anemia in chronic kidney disease: Secondary | ICD-10-CM | POA: Diagnosis not present

## 2022-03-30 DIAGNOSIS — I5042 Chronic combined systolic (congestive) and diastolic (congestive) heart failure: Secondary | ICD-10-CM | POA: Diagnosis not present

## 2022-03-30 DIAGNOSIS — N1831 Chronic kidney disease, stage 3a: Secondary | ICD-10-CM | POA: Diagnosis not present

## 2022-04-05 DIAGNOSIS — N1831 Chronic kidney disease, stage 3a: Secondary | ICD-10-CM | POA: Diagnosis not present

## 2022-04-05 DIAGNOSIS — E785 Hyperlipidemia, unspecified: Secondary | ICD-10-CM | POA: Diagnosis not present

## 2022-04-05 DIAGNOSIS — J449 Chronic obstructive pulmonary disease, unspecified: Secondary | ICD-10-CM | POA: Diagnosis not present

## 2022-04-05 DIAGNOSIS — I130 Hypertensive heart and chronic kidney disease with heart failure and stage 1 through stage 4 chronic kidney disease, or unspecified chronic kidney disease: Secondary | ICD-10-CM | POA: Diagnosis not present

## 2022-04-05 DIAGNOSIS — I4821 Permanent atrial fibrillation: Secondary | ICD-10-CM | POA: Diagnosis not present

## 2022-04-05 DIAGNOSIS — R7989 Other specified abnormal findings of blood chemistry: Secondary | ICD-10-CM | POA: Diagnosis not present

## 2022-04-05 DIAGNOSIS — I700 Atherosclerosis of aorta: Secondary | ICD-10-CM | POA: Diagnosis not present

## 2022-04-05 DIAGNOSIS — Z7901 Long term (current) use of anticoagulants: Secondary | ICD-10-CM | POA: Diagnosis not present

## 2022-04-06 DIAGNOSIS — I5042 Chronic combined systolic (congestive) and diastolic (congestive) heart failure: Secondary | ICD-10-CM | POA: Diagnosis not present

## 2022-04-06 DIAGNOSIS — I130 Hypertensive heart and chronic kidney disease with heart failure and stage 1 through stage 4 chronic kidney disease, or unspecified chronic kidney disease: Secondary | ICD-10-CM | POA: Diagnosis not present

## 2022-04-06 DIAGNOSIS — D631 Anemia in chronic kidney disease: Secondary | ICD-10-CM | POA: Diagnosis not present

## 2022-04-06 DIAGNOSIS — I872 Venous insufficiency (chronic) (peripheral): Secondary | ICD-10-CM | POA: Diagnosis not present

## 2022-04-06 DIAGNOSIS — L97821 Non-pressure chronic ulcer of other part of left lower leg limited to breakdown of skin: Secondary | ICD-10-CM | POA: Diagnosis not present

## 2022-04-06 DIAGNOSIS — N1831 Chronic kidney disease, stage 3a: Secondary | ICD-10-CM | POA: Diagnosis not present

## 2022-04-10 ENCOUNTER — Emergency Department (HOSPITAL_COMMUNITY): Payer: Medicare Other

## 2022-04-10 ENCOUNTER — Inpatient Hospital Stay (HOSPITAL_COMMUNITY)
Admission: EM | Admit: 2022-04-10 | Discharge: 2022-04-14 | DRG: 291 | Disposition: A | Discharge status: Skilled Nursing Facility | Payer: Medicare Other | Source: Skilled Nursing Facility | Attending: Internal Medicine | Admitting: Internal Medicine

## 2022-04-10 ENCOUNTER — Telehealth: Payer: Self-pay | Admitting: Physician Assistant

## 2022-04-10 DIAGNOSIS — I672 Cerebral atherosclerosis: Secondary | ICD-10-CM | POA: Diagnosis not present

## 2022-04-10 DIAGNOSIS — Z8249 Family history of ischemic heart disease and other diseases of the circulatory system: Secondary | ICD-10-CM

## 2022-04-10 DIAGNOSIS — K219 Gastro-esophageal reflux disease without esophagitis: Secondary | ICD-10-CM | POA: Diagnosis present

## 2022-04-10 DIAGNOSIS — S2239XA Fracture of one rib, unspecified side, initial encounter for closed fracture: Secondary | ICD-10-CM

## 2022-04-10 DIAGNOSIS — I509 Heart failure, unspecified: Secondary | ICD-10-CM | POA: Insufficient documentation

## 2022-04-10 DIAGNOSIS — Z7189 Other specified counseling: Secondary | ICD-10-CM | POA: Diagnosis not present

## 2022-04-10 DIAGNOSIS — I499 Cardiac arrhythmia, unspecified: Secondary | ICD-10-CM | POA: Diagnosis not present

## 2022-04-10 DIAGNOSIS — I4821 Permanent atrial fibrillation: Secondary | ICD-10-CM | POA: Diagnosis present

## 2022-04-10 DIAGNOSIS — E7800 Pure hypercholesterolemia, unspecified: Secondary | ICD-10-CM | POA: Diagnosis present

## 2022-04-10 DIAGNOSIS — K7682 Hepatic encephalopathy: Secondary | ICD-10-CM | POA: Diagnosis present

## 2022-04-10 DIAGNOSIS — Z79899 Other long term (current) drug therapy: Secondary | ICD-10-CM | POA: Diagnosis not present

## 2022-04-10 DIAGNOSIS — I2729 Other secondary pulmonary hypertension: Secondary | ICD-10-CM | POA: Diagnosis present

## 2022-04-10 DIAGNOSIS — I5022 Chronic systolic (congestive) heart failure: Secondary | ICD-10-CM | POA: Insufficient documentation

## 2022-04-10 DIAGNOSIS — N1831 Chronic kidney disease, stage 3a: Secondary | ICD-10-CM | POA: Diagnosis not present

## 2022-04-10 DIAGNOSIS — I10 Essential (primary) hypertension: Secondary | ICD-10-CM | POA: Diagnosis not present

## 2022-04-10 DIAGNOSIS — I2510 Atherosclerotic heart disease of native coronary artery without angina pectoris: Secondary | ICD-10-CM | POA: Diagnosis present

## 2022-04-10 DIAGNOSIS — K7460 Unspecified cirrhosis of liver: Secondary | ICD-10-CM | POA: Diagnosis present

## 2022-04-10 DIAGNOSIS — W19XXXA Unspecified fall, initial encounter: Principal | ICD-10-CM

## 2022-04-10 DIAGNOSIS — N281 Cyst of kidney, acquired: Secondary | ICD-10-CM | POA: Diagnosis not present

## 2022-04-10 DIAGNOSIS — Z7901 Long term (current) use of anticoagulants: Secondary | ICD-10-CM | POA: Diagnosis not present

## 2022-04-10 DIAGNOSIS — M4312 Spondylolisthesis, cervical region: Secondary | ICD-10-CM | POA: Diagnosis not present

## 2022-04-10 DIAGNOSIS — Z66 Do not resuscitate: Secondary | ICD-10-CM | POA: Diagnosis present

## 2022-04-10 DIAGNOSIS — W1830XA Fall on same level, unspecified, initial encounter: Secondary | ICD-10-CM | POA: Diagnosis present

## 2022-04-10 DIAGNOSIS — H3530 Unspecified macular degeneration: Secondary | ICD-10-CM | POA: Diagnosis present

## 2022-04-10 DIAGNOSIS — I739 Peripheral vascular disease, unspecified: Secondary | ICD-10-CM | POA: Diagnosis not present

## 2022-04-10 DIAGNOSIS — Z86711 Personal history of pulmonary embolism: Secondary | ICD-10-CM | POA: Diagnosis not present

## 2022-04-10 DIAGNOSIS — R609 Edema, unspecified: Secondary | ICD-10-CM | POA: Diagnosis not present

## 2022-04-10 DIAGNOSIS — N400 Enlarged prostate without lower urinary tract symptoms: Secondary | ICD-10-CM | POA: Diagnosis not present

## 2022-04-10 DIAGNOSIS — S2241XA Multiple fractures of ribs, right side, initial encounter for closed fracture: Secondary | ICD-10-CM | POA: Diagnosis present

## 2022-04-10 DIAGNOSIS — Z743 Need for continuous supervision: Secondary | ICD-10-CM | POA: Diagnosis not present

## 2022-04-10 DIAGNOSIS — I5082 Biventricular heart failure: Secondary | ICD-10-CM | POA: Diagnosis present

## 2022-04-10 DIAGNOSIS — Y92129 Unspecified place in nursing home as the place of occurrence of the external cause: Secondary | ICD-10-CM

## 2022-04-10 DIAGNOSIS — R188 Other ascites: Secondary | ICD-10-CM | POA: Diagnosis present

## 2022-04-10 DIAGNOSIS — R6889 Other general symptoms and signs: Secondary | ICD-10-CM | POA: Diagnosis not present

## 2022-04-10 DIAGNOSIS — Z85828 Personal history of other malignant neoplasm of skin: Secondary | ICD-10-CM | POA: Diagnosis not present

## 2022-04-10 DIAGNOSIS — J90 Pleural effusion, not elsewhere classified: Secondary | ICD-10-CM | POA: Diagnosis not present

## 2022-04-10 DIAGNOSIS — G9341 Metabolic encephalopathy: Secondary | ICD-10-CM | POA: Diagnosis present

## 2022-04-10 DIAGNOSIS — J449 Chronic obstructive pulmonary disease, unspecified: Secondary | ICD-10-CM | POA: Diagnosis present

## 2022-04-10 DIAGNOSIS — I5043 Acute on chronic combined systolic (congestive) and diastolic (congestive) heart failure: Secondary | ICD-10-CM | POA: Diagnosis present

## 2022-04-10 DIAGNOSIS — I130 Hypertensive heart and chronic kidney disease with heart failure and stage 1 through stage 4 chronic kidney disease, or unspecified chronic kidney disease: Secondary | ICD-10-CM | POA: Diagnosis present

## 2022-04-10 DIAGNOSIS — Z953 Presence of xenogenic heart valve: Secondary | ICD-10-CM

## 2022-04-10 DIAGNOSIS — I5023 Acute on chronic systolic (congestive) heart failure: Secondary | ICD-10-CM | POA: Diagnosis not present

## 2022-04-10 DIAGNOSIS — Z515 Encounter for palliative care: Secondary | ICD-10-CM | POA: Diagnosis not present

## 2022-04-10 DIAGNOSIS — N182 Chronic kidney disease, stage 2 (mild): Secondary | ICD-10-CM | POA: Diagnosis not present

## 2022-04-10 DIAGNOSIS — N3289 Other specified disorders of bladder: Secondary | ICD-10-CM | POA: Diagnosis not present

## 2022-04-10 DIAGNOSIS — G9340 Encephalopathy, unspecified: Secondary | ICD-10-CM

## 2022-04-10 DIAGNOSIS — D6959 Other secondary thrombocytopenia: Secondary | ICD-10-CM | POA: Diagnosis present

## 2022-04-10 LAB — URINALYSIS, ROUTINE W REFLEX MICROSCOPIC
Bilirubin Urine: NEGATIVE
Glucose, UA: NEGATIVE mg/dL
Ketones, ur: NEGATIVE mg/dL
Leukocytes,Ua: NEGATIVE
Nitrite: NEGATIVE
Protein, ur: NEGATIVE mg/dL
Specific Gravity, Urine: 1.02 (ref 1.005–1.030)
pH: 5 (ref 5.0–8.0)

## 2022-04-10 LAB — I-STAT VENOUS BLOOD GAS, ED
Acid-Base Excess: 2 mmol/L (ref 0.0–2.0)
Bicarbonate: 26 mmol/L (ref 20.0–28.0)
Calcium, Ion: 1.11 mmol/L — ABNORMAL LOW (ref 1.15–1.40)
HCT: 36 % — ABNORMAL LOW (ref 39.0–52.0)
Hemoglobin: 12.2 g/dL — ABNORMAL LOW (ref 13.0–17.0)
O2 Saturation: 86 %
Potassium: 4.5 mmol/L (ref 3.5–5.1)
Sodium: 139 mmol/L (ref 135–145)
TCO2: 27 mmol/L (ref 22–32)
pCO2, Ven: 39.1 mmHg — ABNORMAL LOW (ref 44–60)
pH, Ven: 7.431 — ABNORMAL HIGH (ref 7.25–7.43)
pO2, Ven: 50 mmHg — ABNORMAL HIGH (ref 32–45)

## 2022-04-10 LAB — I-STAT CHEM 8, ED
BUN: 47 mg/dL — ABNORMAL HIGH (ref 8–23)
Calcium, Ion: 1.14 mmol/L — ABNORMAL LOW (ref 1.15–1.40)
Chloride: 105 mmol/L (ref 98–111)
Creatinine, Ser: 1.3 mg/dL — ABNORMAL HIGH (ref 0.61–1.24)
Glucose, Bld: 87 mg/dL (ref 70–99)
HCT: 40 % (ref 39.0–52.0)
Hemoglobin: 13.6 g/dL (ref 13.0–17.0)
Potassium: 4.5 mmol/L (ref 3.5–5.1)
Sodium: 140 mmol/L (ref 135–145)
TCO2: 27 mmol/L (ref 22–32)

## 2022-04-10 LAB — CBC
HCT: 39.9 % (ref 39.0–52.0)
Hemoglobin: 13 g/dL (ref 13.0–17.0)
MCH: 30.2 pg (ref 26.0–34.0)
MCHC: 32.6 g/dL (ref 30.0–36.0)
MCV: 92.6 fL (ref 80.0–100.0)
Platelets: 88 10*3/uL — ABNORMAL LOW (ref 150–400)
RBC: 4.31 MIL/uL (ref 4.22–5.81)
RDW: 16.4 % — ABNORMAL HIGH (ref 11.5–15.5)
WBC: 4.5 10*3/uL (ref 4.0–10.5)
nRBC: 0 % (ref 0.0–0.2)

## 2022-04-10 LAB — COMPREHENSIVE METABOLIC PANEL
ALT: 13 U/L (ref 0–44)
AST: 25 U/L (ref 15–41)
Albumin: 3.2 g/dL — ABNORMAL LOW (ref 3.5–5.0)
Alkaline Phosphatase: 98 U/L (ref 38–126)
Anion gap: 10 (ref 5–15)
BUN: 43 mg/dL — ABNORMAL HIGH (ref 8–23)
CO2: 25 mmol/L (ref 22–32)
Calcium: 9.2 mg/dL (ref 8.9–10.3)
Chloride: 103 mmol/L (ref 98–111)
Creatinine, Ser: 1.31 mg/dL — ABNORMAL HIGH (ref 0.61–1.24)
GFR, Estimated: 50 mL/min — ABNORMAL LOW (ref >60)
Glucose, Bld: 93 mg/dL (ref 70–99)
Potassium: 4.5 mmol/L (ref 3.5–5.1)
Sodium: 138 mmol/L (ref 135–145)
Total Bilirubin: 1.5 mg/dL — ABNORMAL HIGH (ref 0.3–1.2)
Total Protein: 6.8 g/dL (ref 6.5–8.1)

## 2022-04-10 LAB — PROTIME-INR
INR: 2.6 — ABNORMAL HIGH (ref 0.8–1.2)
Prothrombin Time: 27.7 seconds — ABNORMAL HIGH (ref 11.4–15.2)

## 2022-04-10 LAB — URINALYSIS, MICROSCOPIC (REFLEX): RBC / HPF: >50 RBC/hpf (ref 0–5)

## 2022-04-10 LAB — AMMONIA: Ammonia: 19 umol/L (ref 9–35)

## 2022-04-10 LAB — ETHANOL: Alcohol, Ethyl (B): <10 mg/dL (ref <10)

## 2022-04-10 LAB — TSH: TSH: 2.652 u[IU]/mL (ref 0.350–4.500)

## 2022-04-10 LAB — CK: Total CK: 52 U/L (ref 49–397)

## 2022-04-10 LAB — BRAIN NATRIURETIC PEPTIDE: B Natriuretic Peptide: 1283.4 pg/mL — ABNORMAL HIGH (ref 0.0–100.0)

## 2022-04-10 LAB — LACTIC ACID, PLASMA: Lactic Acid, Venous: 1.6 mmol/L (ref 0.5–1.9)

## 2022-04-10 MED ORDER — LACTATED RINGERS IV BOLUS
250.0000 mL | Freq: Once | INTRAVENOUS | Status: AC
  Administered 2022-04-10: 250 mL via INTRAVENOUS

## 2022-04-10 MED ORDER — ATORVASTATIN CALCIUM 10 MG PO TABS
20.0000 mg | ORAL_TABLET | Freq: Every day | ORAL | Status: DC
  Administered 2022-04-11 – 2022-04-14 (×4): 20 mg via ORAL
  Filled 2022-04-10 (×4): qty 2

## 2022-04-10 MED ORDER — LACTULOSE 10 GM/15ML PO SOLN
30.0000 g | Freq: Two times a day (BID) | ORAL | Status: DC
  Administered 2022-04-10 – 2022-04-13 (×6): 30 g via ORAL
  Filled 2022-04-10 (×6): qty 45

## 2022-04-10 MED ORDER — FUROSEMIDE 10 MG/ML IJ SOLN
40.0000 mg | Freq: Two times a day (BID) | INTRAMUSCULAR | Status: DC
  Administered 2022-04-10 – 2022-04-11 (×2): 40 mg via INTRAVENOUS
  Filled 2022-04-10 (×2): qty 4

## 2022-04-10 MED ORDER — SODIUM CHLORIDE 0.9% FLUSH: 3.0000 mL | INTRAVENOUS | Status: DC | PRN

## 2022-04-10 MED ORDER — SPIRONOLACTONE 25 MG PO TABS
25.0000 mg | ORAL_TABLET | Freq: Every day | ORAL | Status: DC
  Administered 2022-04-11 – 2022-04-14 (×4): 25 mg via ORAL
  Filled 2022-04-10 (×4): qty 1

## 2022-04-10 MED ORDER — POLYETHYLENE GLYCOL 3350 17 G PO PACK: 17.0000 g | PACK | Freq: Every day | ORAL | Status: DC | PRN

## 2022-04-10 MED ORDER — IOHEXOL 350 MG/ML SOLN
75.0000 mL | Freq: Once | INTRAVENOUS | Status: AC | PRN
  Administered 2022-04-10: 75 mL via INTRAVENOUS

## 2022-04-10 MED ORDER — ACETAMINOPHEN 325 MG PO TABS
650.0000 mg | ORAL_TABLET | Freq: Once | ORAL | Status: AC
  Administered 2022-04-10: 650 mg via ORAL
  Filled 2022-04-10: qty 2

## 2022-04-10 MED ORDER — FINASTERIDE 5 MG PO TABS
5.0000 mg | ORAL_TABLET | Freq: Every day | ORAL | Status: DC
  Administered 2022-04-11 – 2022-04-14 (×4): 5 mg via ORAL
  Filled 2022-04-10 (×4): qty 1

## 2022-04-10 MED ORDER — LIDOCAINE 5 % EX PTCH
1.0000 | MEDICATED_PATCH | CUTANEOUS | Status: DC
  Administered 2022-04-10 – 2022-04-13 (×4): 1 via TRANSDERMAL
  Filled 2022-04-10 (×4): qty 1

## 2022-04-10 MED ORDER — TAMSULOSIN HCL 0.4 MG PO CAPS
0.4000 mg | ORAL_CAPSULE | Freq: Every day | ORAL | Status: DC
  Administered 2022-04-11 – 2022-04-14 (×4): 0.4 mg via ORAL
  Filled 2022-04-10 (×4): qty 1

## 2022-04-10 MED ORDER — SACUBITRIL-VALSARTAN 24-26 MG PO TABS
1.0000 | ORAL_TABLET | Freq: Two times a day (BID) | ORAL | Status: DC
  Administered 2022-04-10 – 2022-04-14 (×8): 1 via ORAL
  Filled 2022-04-10 (×8): qty 1

## 2022-04-10 MED ORDER — LIDOCAINE 5 % EX PTCH: 1.0000 | MEDICATED_PATCH | CUTANEOUS | Status: DC

## 2022-04-10 MED ORDER — WARFARIN SODIUM 5 MG PO TABS
5.0000 mg | ORAL_TABLET | Freq: Once | ORAL | Status: AC
  Administered 2022-04-10: 5 mg via ORAL
  Filled 2022-04-10: qty 1

## 2022-04-10 MED ORDER — ONDANSETRON HCL 4 MG/2ML IJ SOLN: 4.0000 mg | Freq: Four times a day (QID) | INTRAMUSCULAR | Status: DC | PRN

## 2022-04-10 MED ORDER — METOPROLOL SUCCINATE ER 25 MG PO TB24
12.5000 mg | ORAL_TABLET | Freq: Every day | ORAL | Status: DC
  Administered 2022-04-11 – 2022-04-14 (×4): 12.5 mg via ORAL
  Filled 2022-04-10 (×4): qty 1

## 2022-04-10 MED ORDER — WARFARIN - PHARMACIST DOSING INPATIENT: Freq: Every day | Status: DC

## 2022-04-10 MED ORDER — ISOSORBIDE MONONITRATE ER 60 MG PO TB24
60.0000 mg | ORAL_TABLET | Freq: Every day | ORAL | Status: DC
  Administered 2022-04-11 – 2022-04-13 (×3): 60 mg via ORAL
  Filled 2022-04-10 (×3): qty 1

## 2022-04-10 MED ORDER — SODIUM CHLORIDE 0.9% FLUSH
3.0000 mL | Freq: Two times a day (BID) | INTRAVENOUS | Status: DC
  Administered 2022-04-10 – 2022-04-14 (×3): 3 mL via INTRAVENOUS

## 2022-04-10 MED ORDER — LORAZEPAM 0.5 MG PO TABS: 0.5000 mg | ORAL_TABLET | Freq: Two times a day (BID) | ORAL | Status: DC | PRN

## 2022-04-10 MED ORDER — FESOTERODINE FUMARATE ER 8 MG PO TB24
8.0000 mg | ORAL_TABLET | Freq: Every day | ORAL | Status: DC
  Administered 2022-04-11 – 2022-04-14 (×4): 8 mg via ORAL
  Filled 2022-04-10 (×4): qty 1

## 2022-04-10 MED ORDER — ACETAMINOPHEN 500 MG PO TABS: 500.0000 mg | ORAL_TABLET | ORAL | Status: DC | PRN

## 2022-04-10 MED ORDER — POLYETHYLENE GLYCOL 3350 17 G PO PACK
17.0000 g | PACK | Freq: Every day | ORAL | Status: DC
  Administered 2022-04-13 – 2022-04-14 (×2): 17 g via ORAL
  Filled 2022-04-10 (×3): qty 1

## 2022-04-10 MED ORDER — SODIUM CHLORIDE 0.9 % IV SOLN: 250.0000 mL | INTRAVENOUS | Status: DC | PRN

## 2022-04-10 MED ORDER — ACETAMINOPHEN 325 MG PO TABS: 650.0000 mg | ORAL_TABLET | ORAL | Status: DC | PRN

## 2022-04-10 NOTE — ED Notes (Signed)
ED TO INPATIENT HANDOFF REPORT  ED Nurse Name and Phone #:  [place] RN [phone]  I took over for this patient at 3pm. Since he has been sleeping, visitor was at bedside and reports he is normally confused, but more alert than he has been today. He is resting comfortably, on room air. DNR form at bedside. Admission medication has not been verified at this time. GCS 14 at baseline for confusion.   S Name/Age/Gender Tyler Williams 89 y.o. male Room/Bed: 037C/037C  Code Status   Code Status: DNR  Home/SNF/Other Skilled nursing facility Patient oriented to: self and place Is this baseline? Yes   Triage Complete: Triage complete  Chief Complaint CHF (congestive heart failure) ([place]) [I50.9]  Triage Note Pt BIB [facility] from [facility].  Pt was seen last at 0700 normal in bed.  Pt was found on floor at 0730 on ground next to chair.  Hx Afib and takes Warfarin.  Pt per facility normally GCS 14.  22g right hand.     Allergies No Known Allergies  Level of Care/Admitting Diagnosis ED Disposition     ED Disposition  Admit   Condition  --   Comment  Hospital Area: [facility] [[id number]]  Level of Care: Telemetry Medical [104]  May admit patient to [facility] or [facility] if equivalent level of care is available:: No  Covid Evaluation: Asymptomatic - no recent exposure (last 10 days) testing not required  Diagnosis: CHF (congestive heart failure) [facility]) [id number]  Admitting Physician: Lequita Halt [id number]  Attending Physician: Lequita Halt [id number]  Certification:: I certify this patient will need inpatient services for at least 2 midnights  Estimated Length of Stay: 2          B Medical/Surgery History Past Medical History:  Diagnosis Date   AAA (abdominal aortic aneurysm) ([place])    s/p repair   Aortic atherosclerosis ([place])    Arthritis    knees   CAD (coronary artery disease)    a. s/p POBA 1980s, 1990s (details unclear). b.  known stenotic RCA (previous unsuccessful intervention)   Cancer ([place])    skin cancer removed, right hand, left leg, chest   Carotid artery disease (HCC)    Chronic anticoagulation    Coumadin   Chronic kidney disease, stage 3a (HCC)    Cirrhosis of liver ([place])    by imaging   Coronary artery disease    GERD (gastroesophageal reflux disease)    Heart failure with mildly reduced ejection fraction (HFmrEF) ([place])    History of pulmonary embolism    1964 related to dislocated hip on right    History of skin cancer    Hypercholesteremia    Hypertension    Incidental cecal mass noted on CT imaging 05/24/2016   **An incidental finding of potential clinical significance has been found. 4.6 x 5.6 x 3.7 cm masslike lesion in the cecum adjacent to the ileocecal valve may represent a large polyp or colonic neoplasm. 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 Unable to pass wire past RCA lesion->medically managed   CHOLECYSTECTOMY N/A 12/05/2012   Procedure: LAPAROSCOPIC CHOLECYSTECTOMY ;  Surgeon: Edward Jolly, MD;  Location: [place];  Service: General;  Laterality: N/A;   [place] N/A 08/31/2017   Procedure: CORONARY BALLOON ANGIOPLASTY;  Surgeon: Martinique, Peter M, MD;  Location: [place] [facility];  Service: Cardiovascular;  Laterality: N/A;   EYE SURGERY     hx of cataract surgery    [place]     right inguinal hernia repair    JOINT REPLACEMENT     left knee replacement, partial right   LEFT HEART CATH AND CORONARY ANGIOGRAPHY N/A 08/31/2017   Procedure: LEFT HEART CATH AND CORONARY ANGIOGRAPHY;  Surgeon: Martinique, Peter M, MD;  Location: [place] [facility];  Service: Cardiovascular;  Laterality: N/A;   LEFT HEART CATHETERIZATION WITH CORONARY ANGIOGRAM N/A 05/24/2012   Procedure: LEFT HEART CATHETERIZATION WITH CORONARY ANGIOGRAM;  Surgeon: Burnell Blanks, MD;  Location: [facility] CATH LAB;  Service: Cardiovascular;  Laterality: N/A;   ORTHOPEDIC SURGERY     OTHER SURGICAL HISTORY     right knee popliteal aneurysm surgery stent placed    OTHER SURGICAL HISTORY     PARTIAL KNEE ARTHROPLASTY  01/22/2012   Procedure: UNICOMPARTMENTAL KNEE;  Surgeon: Mauri Pole, MD;  Location: [facility];  Service: Orthopedics;  Laterality: Right;   PERCUTANEOUS CORONARY INTERVENTION-BALLOON ONLY  05/24/2012   Procedure: PERCUTANEOUS CORONARY INTERVENTION-BALLOON ONLY;  Surgeon: Burnell Blanks, MD;  Location: [facility] CATH LAB;  Service: Cardiovascular;;   RIGHT/LEFT HEART CATH AND CORONARY ANGIOGRAPHY N/A 05/12/2016   Procedure: Right/Left Heart Cath and Coronary Angiography;  Surgeon: Sherren Mocha, MD;  Location: [place] [facility];  Service: Cardiovascular;  Laterality: N/A;   TEE WITHOUT CARDIOVERSION N/A 06/13/2016   Procedure: TRANSESOPHAGEAL ECHOCARDIOGRAM (TEE);  Surgeon: Sherren Mocha, MD;  Location: [place];  Service: Open Heart Surgery;  Laterality: N/A;   TONSILLECTOMY     TOTAL KNEE  ARTHROPLASTY Left    TRANSCATHETER AORTIC VALVE REPLACEMENT, TRANSFEMORAL N/A 06/13/2016   Procedure: TRANSCATHETER AORTIC VALVE REPLACEMENT, TRANSFEMORAL;  Surgeon: Sherren Mocha, MD;  Location: [facility];  Service: Open Heart Surgery;  Laterality: N/A;     A IV Location/Drains/Wounds Patient Lines/Drains/Airways Status     Active Line/Drains/Airways     Name Placement date Placement time Site Days   Peripheral IV 04/10/22 20 G Anterior;Left Forearm 04/10/22  0905  Forearm  less than 1   Peripheral IV 04/10/22 22 G Posterior;Right Hand 04/10/22  0836  Hand  less than 1   External Urinary Catheter 12/22/21  1544  --  109   Wound / Incision (Open or Dehisced) 12/23/21 Pretibial Left;Proximal;Lower 12/23/21  1700  Pretibial  108            Intake/Output Last 24 hours No intake or output data  in the 24 hours ending 04/10/22 1608  Labs/Imaging Results for orders placed or performed during the hospital encounter of 04/10/22 (from the past 48 hour(s))  Comprehensive metabolic panel     Status: Abnormal   Collection Time: 04/10/22  8:58 AM  Result Value Ref Range   Sodium 138 135 - 145 mmol/L   Potassium 4.5 3.5 - 5.1 mmol/L   Chloride 103 98 - 111 mmol/L   CO2 25 22 - 32 mmol/L   Glucose, Bld 93 70 - 99 mg/dL    Comment: Glucose reference range applies only to samples taken after fasting for at least 8 hours.   BUN 43 (H) 8 - 23 mg/dL   Creatinine, Ser 1.31 (H) 0.61 - 1.24 mg/dL   Calcium 9.2 8.9 - 10.3 mg/dL   Total Protein 6.8 6.5 - 8.1 g/dL   Albumin 3.2 (L) 3.5 - 5.0 g/dL   AST 25 15 - 41 U/L   ALT 13 0 - 44 U/L   Alkaline Phosphatase 98 38 - 126 U/L   Total Bilirubin 1.5 (H) 0.3 - 1.2 mg/dL   GFR, Estimated 50 (L) >60 mL/min    Comment: (NOTE) Calculated using the CKD-EPI Creatinine Equation (2021)    Anion gap 10 5 - 15    Comment: Performed at [facility], [place] [address], [place], [place] [address]  CBC     Status: Abnormal   Collection Time: 04/10/22  8:58 AM   Result Value Ref Range   WBC 4.5 4.0 - 10.5 K/uL   RBC 4.31 4.22 - 5.81 MIL/uL   Hemoglobin 13.0 13.0 - 17.0 g/dL   HCT 39.9 39.0 - 52.0 %   MCV 92.6 80.0 - 100.0 fL   MCH 30.2 26.0 - 34.0 pg   MCHC 32.6 30.0 - 36.0 g/dL   RDW 16.4 (H) 11.5 - 15.5 %   Platelets 88 (L) 150 - 400 K/uL    Comment: REPEATED TO VERIFY   nRBC 0.0 0.0 - 0.2 %    Comment: Performed at [facility], [place] [address] [place], [place] [address]  Ethanol     Status: None   Collection Time: 04/10/22  8:58 AM  Result Value Ref Range   Alcohol, Ethyl (B) <10 <10 mg/dL    Comment: (NOTE) Lowest detectable limit for serum alcohol is 10 mg/dL.  For medical purposes only. Performed at [facility], [place] [address], [place], [place] [address]   Protime-INR     Status: Abnormal   Collection Time: 04/10/22  8:58 AM  Result Value Ref Range   Prothrombin Time 27.7 (H) 11.4 - 15.2 seconds   INR 2.6 (H) 0.8 - 1.2    Comment: (NOTE) INR goal varies based on device and disease states. Performed at [facility], [place] [address] [place], [place] [address]   Lactic acid, plasma     Status: None   Collection Time: 04/10/22  8:58 AM  Result Value Ref Range   Lactic Acid, Venous 1.6 0.5 - 1.9 mmol/L    Comment: Performed at [place] [address] [place], [place] [address]  CK     Status: None   Collection Time: 04/10/22  8:58 AM  Result Value Ref Range   Total CK 52 49 - 397 U/L    Comment: Performed at [facility], [place] [address], [place], [place] [address]  Brain natriuretic peptide     Status: Abnormal  Collection Time: 04/10/22  8:58 AM  Result Value Ref Range   B Natriuretic Peptide 1,283.4 (H) 0.0 - 100.0 pg/mL    Comment: Performed at [place] [address], [place], [place] [address]  I-Stat Chem 8, ED     Status: Abnormal   Collection Time: 04/10/22  9:06 AM  Result Value Ref Range   Sodium 140 135 - 145 mmol/L   Potassium 4.5 3.5 - 5.1 mmol/L    Chloride 105 98 - 111 mmol/L   BUN 47 (H) 8 - 23 mg/dL   Creatinine, Ser 1.30 (H) 0.61 - 1.24 mg/dL   Glucose, Bld 87 70 - 99 mg/dL    Comment: Glucose reference range applies only to samples taken after fasting for at least 8 hours.   Calcium, Ion 1.14 (L) 1.15 - 1.40 mmol/L   TCO2 27 22 - 32 mmol/L   Hemoglobin 13.6 13.0 - 17.0 g/dL   HCT 40.0 39.0 - 52.0 %  TSH     Status: None   Collection Time: 04/10/22  1:09 PM  Result Value Ref Range   TSH 2.652 0.350 - 4.500 uIU/mL    Comment: Performed by a 3rd Generation assay with a functional sensitivity of <=0.01 uIU/mL. Performed at [facility], [place] [address] [place], [place] [address]   I-Stat venous blood gas, [facility], [facility], [facility])     Status: Abnormal   Collection Time: 04/10/22  1:33 PM  Result Value Ref Range   pH, Ven 7.431 (H) 7.25 - 7.43   pCO2, Ven 39.1 (L) 44 - 60 mmHg   pO2, Ven 50 (H) 32 - 45 mmHg   Bicarbonate 26.0 20.0 - 28.0 mmol/L   TCO2 27 22 - 32 mmol/L   O2 Saturation 86 %   Acid-Base Excess 2.0 0.0 - 2.0 mmol/L   Sodium 139 135 - 145 mmol/L   Potassium 4.5 3.5 - 5.1 mmol/L   Calcium, Ion 1.11 (L) 1.15 - 1.40 mmol/L   HCT 36.0 (L) 39.0 - 52.0 %   Hemoglobin 12.2 (L) 13.0 - 17.0 g/dL   Sample type VENOUS    CT CHEST ABDOMEN PELVIS W CONTRAST  Result Date: 04/10/2022 CLINICAL DATA:  89 year old male found down at 0730 hours today. Atrial fibrillation, on warfarin. EXAM: CT CHEST, ABDOMEN, AND PELVIS WITH CONTRAST TECHNIQUE: Multidetector CT imaging of the chest, abdomen and pelvis was performed following the standard protocol during bolus administration of intravenous contrast. RADIATION DOSE REDUCTION: This exam was performed according to the departmental dose-optimization program which includes automated exposure control, adjustment of the mA and/or kV according to patient size and/or use of iterative reconstruction technique. CONTRAST:  65m OMNIPAQUE IOHEXOL 350 MG/ML SOLN COMPARISON:  Cervical spine CT  today. Prior chest CT 12/05/2019 and CT Abdomen and Pelvis 02/08/2018. FINDINGS: CT CHEST FINDINGS Cardiovascular: Chronic aortic valve replacement or TAVR. Cardiomegaly appears mildly increased since 2019. Calcified aortic atherosclerosis. Calcified coronary artery atherosclerosis. No pericardial effusion. Thoracic aorta and other central mediastinal vascular structures appear intact. Mediastinum/Nodes: No mediastinal hematoma or lymphadenopathy identified. Lungs/Pleura: Fairly large layering bilateral pleural effusions, only trace on the 2021 chest CT. Simple fluid density favoring transudate. Superimposed mild respiratory motion. Major airways remain patent. Compressive bilateral lower lobe atelectasis. Lingula atelectasis and/or some pleural fluid tracking in the major fissure there. No pneumothorax. No convincing pulmonary contusion. Musculoskeletal: Osteopenia. Mild T4 superior endplate compression is new since the 2021 chest CT, but is visible on previous two-view chest x-ray 12/22/2021  and chronic. No retropulsion. Other thoracic vertebral height appears stable. No sternal fracture. Visible shoulder osseous structures appear intact. Right anterior minimally displaced 5th rib fracture on series 4, image 106 is new since 2021, as is a similar fracture of the anterior 6th rib on image 120. No other right rib fracture identified. No left rib fracture identified. CT ABDOMEN PELVIS FINDINGS Hepatobiliary: Nodular and cirrhotic liver. Chronically absent gallbladder. No discrete liver mass. Perihepatic ascites has progressed since 2019. Pancreas: Negative. Spleen: No splenomegaly.  Perisplenic ascites. Adrenals/Urinary Tract: No adrenal or renal injury identified. Chronic benign renal cysts (no follow-up imaging recommended). Symmetric renal enhancement and early contrast excretion. Decompressed ureters. Mild to moderate bladder distension. Stomach/Bowel: Redundant large bowel with extensive descending and sigmoid  diverticulosis. Scattered free fluid/ascites in the abdomen. No definite bowel wall thickening. No dilated small bowel. Evidence of a normal appendix on coronal image 67. Decompressed stomach and duodenum. No free air identified. Vascular/Lymphatic: Extensive Aortoiliac calcified atherosclerosis. Status post bilateral iliac artery bypass, stable. Major arterial structures remain patent. On the delayed images the portal venous system appears to be patent. No lymphadenopathy identified. Reproductive: New ascites fluid containing left inguinal hernia since 2019. Other: Small volume simple fluid density ascites in the pelvis, similar to that in 2019. Musculoskeletal: Widespread chronic lumbar disc and endplate degeneration with degenerative lumbar scoliosis. No acute lumbar, sacral, pelvic, or proximal femur fracture identified. Partial bilateral SI joint ankylosis. Generalized abdominal and pelvic body wall edema/anasarca is new since 2019. IMPRESSION: 1. Acute minimally displaced fractures of the right anterior 5th and 6th ribs. 2. No pneumothorax, pulmonary contusion, or other acute traumatic injury identified in the chest, abdomen, or pelvis. 3. Chronic Cirrhosis and Cardiomegaly with Anasarca, increased Ascites from prior CTs, and Large layering pleural effusions with compressive atelectasis. 4.  Aortic Atherosclerosis (ICD10-I70.0). Electronically Signed   By: Genevie Ann M.D.   On: 04/10/2022 10:11   CT CERVICAL SPINE WO CONTRAST  Result Date: 04/10/2022 CLINICAL DATA:  89 year old male found down at 0730 hours today. Atrial fibrillation, on warfarin. EXAM: CT CERVICAL SPINE WITHOUT CONTRAST TECHNIQUE: Multidetector CT imaging of the cervical spine was performed without intravenous contrast. Multiplanar CT image reconstructions were also generated. RADIATION DOSE REDUCTION: This exam was performed according to the departmental dose-optimization program which includes automated exposure control, adjustment of the  mA and/or kV according to patient size and/or use of iterative reconstruction technique. COMPARISON:  Head CT today.  Cervical spine CT 04/27/2021. FINDINGS: Alignment: Stable. Straightening of cervical lordosis with chronic degenerative appearing anterolisthesis of C2 on C3 and C7 on T1. Bilateral posterior element alignment is within normal limits. Skull base and vertebrae: Mild motion artifact. Visualized skull base is intact. No atlanto-occipital dissociation. C1 and C2 appear intact and aligned. No acute osseous abnormality identified. Soft tissues and spinal canal: No prevertebral fluid or swelling. No visible canal hematoma. Severe chronic right cervical calcified atherosclerosis. Bulky contralateral left carotid calcified atherosclerosis. Stable visible noncontrast neck soft tissues. Disc levels: Stable chronic cervical spine degeneration. Up to mild suspected cervical spinal stenosis. Upper chest: Osteopenia. Visible upper thoracic levels appear intact. Moderate to large new layering pleural effusions since last year visible in the lung apices. Simple fluid density suggesting transudate. IMPRESSION: 1. No acute traumatic injury identified in the cervical spine. Stable chronic cervical spine degeneration. 2. Moderate to large layering pleural effusions visible in the lung apices and new from cervical spine CT last year. 3. Chronic severe calcified carotid atherosclerosis worse on the right.  Electronically Signed   By: Genevie Ann M.D.   On: 04/10/2022 09:56   CT HEAD WO CONTRAST  Result Date: 04/10/2022 CLINICAL DATA:  89 year old male found down at 0730 hours today. Atrial fibrillation, on warfarin. EXAM: CT HEAD WITHOUT CONTRAST TECHNIQUE: Contiguous axial images were obtained from the base of the skull through the vertex without intravenous contrast. RADIATION DOSE REDUCTION: This exam was performed according to the departmental dose-optimization program which includes automated exposure control,  adjustment of the mA and/or kV according to patient size and/or use of iterative reconstruction technique. COMPARISON:  Head CT 04/27/2021. FINDINGS: Brain: Stable cerebral volume since last year. No midline shift, ventriculomegaly, mass effect, evidence of mass lesion, intracranial hemorrhage or evidence of cortically based acute infarction. Stable small chronic appearing cerebellar infarcts. Patchy mild to moderate for age cerebral white matter hypodensity does not appear significantly changed. Vascular: Calcified atherosclerosis at the skull base. No suspicious intracranial vascular hyperdensity. Skull: Motion artifact at the skull base. No acute osseous abnormality identified. Sinuses/Orbits: Visualized paranasal sinuses and mastoids are stable and well aerated. Other: Calcified scalp vessel atherosclerosis. No orbit or scalp soft tissue injury identified. IMPRESSION: 1. Mild motion artifact. No acute intracranial abnormality or acute traumatic injury identified. 2. Stable non contrast CT appearance of chronic small vessel disease. Electronically Signed   By: Genevie Ann M.D.   On: 04/10/2022 09:53    Pending Labs Unresulted Labs (From admission, onward)     Start     Ordered   04/11/22 [id number]  Basic metabolic panel  Daily,   R     Comments: As Scheduled for 5 days    04/10/22 1316   04/10/22 1312  Ammonia  Add-on,   AD        04/10/22 1311   04/10/22 0846  Urinalysis, Routine w reflex microscopic -Urine, Clean Catch  (Trauma Panel)  Once,   URGENT       Question:  Specimen Source  Answer:  Urine, Clean Catch   04/10/22 0846            Vitals/Pain Today's Vitals   04/10/22 1243 04/10/22 1345 04/10/22 1517 04/10/22 1537  BP:  (!) 159/74  (!) 158/67  Pulse:  (!) 58  (!) 58  Resp:  15  (!) 22  Temp: 97.7 F (36.5 C)     TempSrc: Oral     SpO2:  97%  92%  Weight:      Height:      PainSc:   Asleep     Isolation Precautions No active isolations  Medications Medications  lidocaine  (LIDODERM) 5 % 1 patch (1 patch Transdermal Patch Applied 04/10/22 1320)  furosemide (LASIX) injection 40 mg (has no administration in time range)  acetaminophen (TYLENOL) tablet 500 mg (has no administration in time range)  atorvastatin (LIPITOR) tablet 20 mg (has no administration in time range)  isosorbide mononitrate (IMDUR) 24 hr tablet 60 mg (has no administration in time range)  metoprolol succinate (TOPROL-XL) 24 hr tablet 12.5 mg (has no administration in time range)  sacubitril-valsartan (ENTRESTO) 24-26 mg per tablet (has no administration in time range)  spironolactone (ALDACTONE) tablet 25 mg (has no administration in time range)  LORazepam (ATIVAN) tablet 0.5 mg (has no administration in time range)  polyethylene glycol (MIRALAX / GLYCOLAX) packet 17 g (has no administration in time range)  fesoterodine (TOVIAZ) tablet 8 mg (has no administration in time range)  finasteride (PROSCAR) tablet 5 mg (has no administration in time range)  tamsulosin (FLOMAX) capsule 0.4 mg (has no administration in time range)  Polyethylene Glycol 3350 POWD 17 g (has no administration in time range)  sodium chloride flush (NS) 0.9 % injection 3 mL (has no administration in time range)  sodium chloride flush (NS) 0.9 % injection 3 mL (has no administration in time range)  0.9 %  sodium chloride infusion (has no administration in time range)  acetaminophen (TYLENOL) tablet 650 mg (has no administration in time range)  ondansetron (ZOFRAN) injection 4 mg (has no administration in time range)  iohexol (OMNIPAQUE) 350 MG/ML injection 75 mL (75 mLs Intravenous Contrast Given 04/10/22 0948)  lactated ringers bolus 250 mL (0 mLs Intravenous Stopped 04/10/22 1333)  acetaminophen (TYLENOL) tablet 650 mg (650 mg Oral Given 04/10/22 1320)    Mobility walks with person assist       R Recommendations: See Admitting Provider Note

## 2022-04-10 NOTE — Telephone Encounter (Signed)
New Message:     Son called and said patient fell and broke several ribs and is in the hospital. Patient was scheduled to see [name] tomorrow(04-11-22). He wants Scott to look at patient's test results and his chart in general to see what is going on  please.

## 2022-04-10 NOTE — Progress Notes (Deleted)
Cardiology Office Note:    Date:  04/10/2022   ID:  Tyler Williams, DOB 25-Feb-1926, MRN [id number]  PCP:  Burnard Bunting, [place] Prov[facility] Cardiologist:  Sherren Mocha, MD { Click to update primary MD,subspecialty MD or APP then REFRESH:1}  *** Referring MD: Burnard Bunting, MD   Patient Profile: Permanent atrial fibrillation Coumadin anticoagulation Coronary artery disease S/p POBA in the 1980s, 1990s Known high-grade stenosis of mid RCA; unsuccessful attempted PCI in past Cath 7/19: Mid RCA 95, 85 >>unsuccessful PCI attempt to the RCA HFmrEF (heart failure with mildly reduced ejection fraction)  TTE 4/22: EF 45-50 with inferior and inferolateral HK, GRII DD TTE 5/23: EF 45-50, RVSP 64  TTE 12/23/21: EF 45-50, global HK, mod to severe reduced RVSF, mod elev PASP (RVSP 52.5), severe BAE, mod MR, mild to mod TR, AVR w trivial AI (normal fxn),  Mod MR (TTE 12/2021) Pulmonary hypertension  Peripheral arterial disease AAA s/p repair Carotid artery stenosis [place] 12/04/16: Bilat ICA 1-39 Aortic stenosis S/p TAVR 06/2016 Venous insufficiency  Aortic atherosclerosis History of urinary retention Cirrhosis Noted on [place] in 11/2021 during admit - conservative management     History of Present Illness:   Tyler Williams is a 89 y.o. male with the above problem list.  He was last seen 01/04/22. He was seen in the ED 04/10/22 for mental status changes and a fall. *** He returns for f/u on CHF, CAD, AFib. ***  {EKG done?:28833::"EKG: ***"}  Subjective    Reviewed and updated this encounter:***     ROS  Objective   Labs/Other Test Reviewed:   Recent Labs: 11/23/2021: Magnesium 2.3 12/22/2021: B Natriuretic Peptide 1,164.9 12/23/2021: ALT 13 12/26/2021: Hemoglobin 11.5; Platelets 147 01/04/2022: BUN 30; Creatinine, Ser 1.21; Potassium 4.8; Sodium 142   Recent Lipid Panel No results for input(s): "CHOL", "TRIG", "HDL", "VLDL", "LDLCALC", "LDLDIRECT" in  the last 8760 hours.   Risk Assessment/Calculations/Metrics:   {Does this patient have ATRIAL FIBRILLATION?[phone]}     No BP recorded.  {Refresh Note OR Click here to enter BP  :1}***   Physical Exam:   VS:  There were no vitals taken for this visit.   Wt Readings from Last 3 Encounters:  01/04/22 170 lb (77.1 kg)  12/25/21 167 lb (75.8 kg)  12/05/21 176 lb (79.8 kg)    Physical Exam *** Assessment & Plan    ASSESSMENT & PLAN:   No problem-specific Assessment & Plan notes found for this encounter.       {  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) Ejection fraction 45-50 by echocardiogram in Nov 2023.  He was recently admitted with decompensated HF in the setting of urosepsis. Since DC, he has done well. His volume is currently stable. NYHA IIb-III.  Continue Lasix 60 mg daily, Imdur 60 mg daily, Toprol-XL 12.5 mg daily, Entresto 24/26 mg twice daily, spironolactone 25 mg daily. Obtain f/u BMET today.    Permanent atrial fibrillation (HCC) Continue Coumadin as directed by Coumadin clinic.     CAD (coronary artery disease) History of multiple PCI procedures in the past.  He has known high-grade disease in the RCA and prior PCI attempt was unsuccessful.  He is not currently having anginal symptoms.  Continue Lipitor 20 mg daily, Imdur 60 mg daily, Toprol-XL 12.5 mg once daily.   Essential hypertension Blood pressure controlled.  Continue Imdur 60 mg daily, Toprol-XL 12.5 mg daily, Entresto 24/26 mg twice daily, spironolactone 25 mg daily.  S/P TAVR (transcatheter aortic valve replacement) TAVR functioning normally on most recent echocardiogram Nov 2023.  Continue SBE prophylaxis.    :1}   {Are you ordering a CV Procedure (e.g. stress test, cath, DCCV, TEE, etc)?   Press F2        :[id number]   Dispo:  No follow-ups on file.   Signed, [name], PA-C  04/10/2022 8:00 AM    [facility] [place], [place], [place]  [address] Phone: [phone]; Fax: [phone]

## 2022-04-10 NOTE — Progress Notes (Signed)
Heart Failure Navigator Progress Note  Assessed for Heart & Vascular TOC clinic readiness.  Patient does not meet criteria due to Advanced Heart failure team patient of Dr. Haroldine Laws.   Navigator will signoff at this time.    Earnestine Leys, BSN, Clinical cytogeneticist Only

## 2022-04-10 NOTE — Progress Notes (Signed)
   04/10/22 0909  Spiritual Encounters  Type of Visit Initial  Care provided to: Patient  Referral source Trauma page  Reason for visit Trauma  OnCall Visit No  Spiritual Framework  Presenting Themes Goals in life/care  Patient Stress Factors Health changes  Interventions  Spiritual Care Interventions Made Compassionate presence;Prayer  Intervention Outcomes  Outcomes Connection to spiritual care;Reduced anxiety;Reduced fear   Chaplain responded to level 2 trauma. Patient was alert and asked for prayer. Chaplain prayed for patient and let staff know how to contact chaplain for further care for the patient or if family arrived.   Note prepared by Abbott Pao, [place] Resident [phone]

## 2022-04-10 NOTE — ED Notes (Signed)
C Collar removed per MD Dixon order

## 2022-04-10 NOTE — Progress Notes (Signed)
Orthopedic Tech Progress Note Patient Details:  Tyler Williams 10/06/1926 [id number] Level 2 Trauma. Not needed Patient ID: [name], male   DOB: 1926-05-06, 89 y.o.   MRN: [id number]  Chip Boer 04/10/2022, 8:58 AM

## 2022-04-10 NOTE — ED Notes (Signed)
Traci, TRN transporting patient to CT scanner #2

## 2022-04-10 NOTE — ED Notes (Signed)
Pt not activated upon arrival.  MD Dixon activated at bedside.  Charge RN Roselyn Reef notified and aware.  Secretary Debbie notified and level 2 activated.

## 2022-04-10 NOTE — ED Provider Notes (Signed)
Tyler Williams Provider Note   CSN: [id number] Arrival date & time: 04/10/22  [id number]     History  Chief Complaint  Patient presents with   Fall    Level 2 On Thinners    Tyler Williams is a 89 y.o. male.  HPI Patient presents for fall and altered mental status.  Medical history includes HTN, CAD, atrial fibrillation, CHF, anxiety, COPD, cirrhosis, BPH, CKD, aortic stenosis s/p TAVR, sick sinus syndrome, GERD, AAA.  He presents by EMS from skilled nursing facility.  Nursing facility staff stated that he was seen in his normal state of health, resting in bed, 30 minutes prior to an unwitnessed fall.  He is reportedly alert and oriented at baseline.  Patient's fall was suspected to be from a chair in the common area.  After the fall, he was noted to be confused.  EMS noted PVCs on twelve-lead EKG.  They placed him on supplemental oxygen but there was no preceding hypoxia.  On arrival, patient denies any areas of discomfort.  He denies shortness of breath.    Home Medications Prior to Admission medications   Medication Sig Start Date End Date Taking? Authorizing Provider  acetaminophen (TYLENOL) 500 MG tablet Take 500 mg by mouth every 4 (four) hours as needed for moderate pain.    [provider]  atorvastatin (LIPITOR) 20 MG tablet TAKE 1 TABLET ONCE DAILY. 12/30/20   Sherren Mocha, MD  Calcium-Magnesium-Zinc (CAL-MAG-ZINC PO) Take 1 tablet by mouth every morning.    [provider]  Cholecalciferol (VITAMIN D-3) 125 MCG (5000 UT) TABS Take 5,000 Units by mouth every morning.    [provider]  fesoterodine (TOVIAZ) 8 MG TB24 tablet Take 8 mg by mouth daily.    [provider]  finasteride (PROSCAR) 5 MG tablet Take 1 tablet (5 mg total) by mouth daily. 06/21/20   Charlynne Cousins, MD  furosemide (LASIX) 40 MG tablet Take 1.5 tablets (60 mg total) by mouth daily. 12/05/21   [name] T, PA-C   isosorbide mononitrate (IMDUR) 60 MG 24 hr tablet Take 1 tablet (60 mg total) by mouth daily. 11/25/21   Domenic Polite, MD  LORazepam (ATIVAN) 1 MG tablet Take 1 mg by mouth 2 (two) times daily as needed for anxiety.    [provider]  metoprolol succinate (TOPROL XL) 25 MG 24 hr tablet Take 0.5 tablets (12.5 mg total) by mouth daily. 10/05/21   [name] T, PA-C  Multiple Vitamins-Minerals (PRESERVISION AREDS) TABS Take 1 tablet by mouth daily. 03/04/09   [provider]  nitroGLYCERIN (NITROSTAT) 0.4 MG SL tablet Place 0.4 mg under the tongue every 5 (five) minutes as needed for chest pain.    [provider]  nystatin cream (MYCOSTATIN) Apply 1 Application topically 2 (two) times daily. 11/28/21   [provider]  polyethylene glycol (MIRALAX / GLYCOLAX) 17 g packet Take 17 g by mouth daily as needed. 12/26/21   Lorella Nimrod, MD  Polyethylene Glycol 3350 POWD Take 17 g by mouth daily.    [provider]  sacubitril-valsartan (ENTRESTO) 24-26 MG Take 1 tablet by mouth 2 (two) times daily. Please keep upcoming appointment for future refills thank you. 07/04/21   Sherren Mocha, MD  spironolactone (ALDACTONE) 25 MG tablet Take 1 tablet (25 mg total) by mouth daily. 10/05/21   [name] T, PA-C  tamsulosin (FLOMAX) 0.4 MG CAPS capsule Take 1 capsule (0.4 mg total) by mouth  daily. 06/21/20   Charlynne Cousins, MD  warfarin (COUMADIN) 2.5 MG tablet Take 2.5 mg by mouth See admin instructions. Take 2.5 mg by mouth daily on Monday, Tuesday, Wednesday, Thursday, and Friday. 11/30/21   [provider]  warfarin (COUMADIN) 2.5 MG tablet Take 1.25 mg by mouth See admin instructions. Take 1.25 mg by mouth on Saturday and Sunday.    [provider]      Allergies    Patient has no known allergies.    Review of Systems   Review of Systems  Unable to perform ROS: Mental status change    Physical Exam Updated Vital Signs BP (!)  152/63   Pulse 66   Temp 97.7 F (36.5 C) (Oral)   Resp 17   Ht '5\' 8"'$  (1.727 m)   Wt 81.6 kg   SpO2 98%   BMI 27.37 kg/m  Physical Exam Vitals and nursing note reviewed.  Constitutional:      General: He is not in acute distress.    Appearance: He is well-developed and normal weight. He is not toxic-appearing or diaphoretic.  HENT:     Head: Normocephalic and atraumatic.     Right Ear: External ear normal.     Left Ear: External ear normal.     Nose: Nose normal.     Mouth/Throat:     Mouth: Mucous membranes are moist.  Eyes:     Extraocular Movements: Extraocular movements intact.     Conjunctiva/sclera: Conjunctivae normal.  Neck:     Comments: Cervical collar in place Cardiovascular:     Rate and Rhythm: Normal rate and regular rhythm.     Heart sounds: No murmur heard. Pulmonary:     Effort: Pulmonary effort is normal. No respiratory distress.     Breath sounds: Normal breath sounds. No wheezing, rhonchi or rales.  Chest:     Chest wall: Tenderness present.  Abdominal:     General: There is distension.     Palpations: Abdomen is soft.     Tenderness: There is no abdominal tenderness.  Musculoskeletal:        General: No swelling.     Cervical back: Neck supple.     Right lower leg: Edema present.     Left lower leg: Edema present.  Skin:    General: Skin is warm and dry.     Coloration: Skin is not jaundiced or pale.     Comments: Bandaged wound to anterior left shin.  Neurological:     General: No focal deficit present.     Mental Status: He is alert. He is disoriented.     Comments: Oriented to person only.  Psychiatric:        Mood and Affect: Mood normal.        Behavior: Behavior normal.     ED Results / Procedures / Treatments   Labs (all labs ordered are listed, but only abnormal results are displayed) Labs Reviewed  COMPREHENSIVE METABOLIC PANEL - Abnormal; Notable for the following components:      Result Value   BUN 43 (*)    Creatinine,  Ser 1.31 (*)    Albumin 3.2 (*)    Total Bilirubin 1.5 (*)    GFR, Estimated 50 (*)    All other components within normal limits  CBC - Abnormal; Notable for the following components:   RDW 16.4 (*)    Platelets 88 (*)    All other components within normal limits  PROTIME-INR -  Abnormal; Notable for the following components:   Prothrombin Time 27.7 (*)    INR 2.6 (*)    All other components within normal limits  I-STAT CHEM 8, ED - Abnormal; Notable for the following components:   BUN 47 (*)    Creatinine, Ser 1.30 (*)    Calcium, Ion 1.14 (*)    All other components within normal limits  ETHANOL  LACTIC ACID, PLASMA  CK  URINALYSIS, ROUTINE W REFLEX MICROSCOPIC  BRAIN NATRIURETIC PEPTIDE  I-STAT VENOUS BLOOD GAS, ED    EKG EKG Interpretation  Date/Time:  Monday April 10 2022 08:57:15 EST Ventricular Rate:  71 PR Interval:  166 QRS Duration: 160 QT Interval:  440 QTC Calculation: 479 R Axis:   120 Text Interpretation: Sinus rhythm Multiple ventricular premature complexes Right bundle branch block Confirmed by Godfrey Pick [phone]) on 04/10/2022 8:59:57 AM  Radiology CT CHEST ABDOMEN PELVIS W CONTRAST  Result Date: 04/10/2022 CLINICAL DATA:  89 year old male found down at 0730 hours today. Atrial fibrillation, on warfarin. EXAM: CT CHEST, ABDOMEN, AND PELVIS WITH CONTRAST TECHNIQUE: Multidetector CT imaging of the chest, abdomen and pelvis was performed following the standard protocol during bolus administration of intravenous contrast. RADIATION DOSE REDUCTION: This exam was performed according to the departmental dose-optimization program which includes automated exposure control, adjustment of the mA and/or kV according to patient size and/or use of iterative reconstruction technique. CONTRAST:  33m OMNIPAQUE IOHEXOL 350 MG/ML SOLN COMPARISON:  Cervical spine CT today. Prior chest CT 12/05/2019 and CT Abdomen and Pelvis 02/08/2018. FINDINGS: CT CHEST FINDINGS Cardiovascular:  Chronic aortic valve replacement or TAVR. Cardiomegaly appears mildly increased since 2019. Calcified aortic atherosclerosis. Calcified coronary artery atherosclerosis. No pericardial effusion. Thoracic aorta and other central mediastinal vascular structures appear intact. Mediastinum/Nodes: No mediastinal hematoma or lymphadenopathy identified. Lungs/Pleura: Fairly large layering bilateral pleural effusions, only trace on the 2021 chest CT. Simple fluid density favoring transudate. Superimposed mild respiratory motion. Major airways remain patent. Compressive bilateral lower lobe atelectasis. Lingula atelectasis and/or some pleural fluid tracking in the major fissure there. No pneumothorax. No convincing pulmonary contusion. Musculoskeletal: Osteopenia. Mild T4 superior endplate compression is new since the 2021 chest CT, but is visible on previous two-view chest x-ray 12/22/2021 and chronic. No retropulsion. Other thoracic vertebral height appears stable. No sternal fracture. Visible shoulder osseous structures appear intact. Right anterior minimally displaced 5th rib fracture on series 4, image 106 is new since 2021, as is a similar fracture of the anterior 6th rib on image 120. No other right rib fracture identified. No left rib fracture identified. CT ABDOMEN PELVIS FINDINGS Hepatobiliary: Nodular and cirrhotic liver. Chronically absent gallbladder. No discrete liver mass. Perihepatic ascites has progressed since 2019. Pancreas: Negative. Spleen: No splenomegaly.  Perisplenic ascites. Adrenals/Urinary Tract: No adrenal or renal injury identified. Chronic benign renal cysts (no follow-up imaging recommended). Symmetric renal enhancement and early contrast excretion. Decompressed ureters. Mild to moderate bladder distension. Stomach/Bowel: Redundant large bowel with extensive descending and sigmoid diverticulosis. Scattered free fluid/ascites in the abdomen. No definite bowel wall thickening. No dilated small  bowel. Evidence of a normal appendix on coronal image 67. Decompressed stomach and duodenum. No free air identified. Vascular/Lymphatic: Extensive Aortoiliac calcified atherosclerosis. Status post bilateral iliac artery bypass, stable. Major arterial structures remain patent. On the delayed images the portal venous system appears to be patent. No lymphadenopathy identified. Reproductive: New ascites fluid containing left inguinal hernia since 2019. Other: Small volume simple fluid density ascites in the pelvis, similar to that  in 2019. Musculoskeletal: Widespread chronic lumbar disc and endplate degeneration with degenerative lumbar scoliosis. No acute lumbar, sacral, pelvic, or proximal femur fracture identified. Partial bilateral SI joint ankylosis. Generalized abdominal and pelvic body wall edema/anasarca is new since 2019. IMPRESSION: 1. Acute minimally displaced fractures of the right anterior 5th and 6th ribs. 2. No pneumothorax, pulmonary contusion, or other acute traumatic injury identified in the chest, abdomen, or pelvis. 3. Chronic Cirrhosis and Cardiomegaly with Anasarca, increased Ascites from prior CTs, and Large layering pleural effusions with compressive atelectasis. 4.  Aortic Atherosclerosis (ICD10-I70.0). Electronically Signed   By: Genevie Ann M.D.   On: 04/10/2022 10:11   CT CERVICAL SPINE WO CONTRAST  Result Date: 04/10/2022 CLINICAL DATA:  89 year old male found down at 0730 hours today. Atrial fibrillation, on warfarin. EXAM: CT CERVICAL SPINE WITHOUT CONTRAST TECHNIQUE: Multidetector CT imaging of the cervical spine was performed without intravenous contrast. Multiplanar CT image reconstructions were also generated. RADIATION DOSE REDUCTION: This exam was performed according to the departmental dose-optimization program which includes automated exposure control, adjustment of the mA and/or kV according to patient size and/or use of iterative reconstruction technique. COMPARISON:  Head CT  today.  Cervical spine CT 04/27/2021. FINDINGS: Alignment: Stable. Straightening of cervical lordosis with chronic degenerative appearing anterolisthesis of C2 on C3 and C7 on T1. Bilateral posterior element alignment is within normal limits. Skull base and vertebrae: Mild motion artifact. Visualized skull base is intact. No atlanto-occipital dissociation. C1 and C2 appear intact and aligned. No acute osseous abnormality identified. Soft tissues and spinal canal: No prevertebral fluid or swelling. No visible canal hematoma. Severe chronic right cervical calcified atherosclerosis. Bulky contralateral left carotid calcified atherosclerosis. Stable visible noncontrast neck soft tissues. Disc levels: Stable chronic cervical spine degeneration. Up to mild suspected cervical spinal stenosis. Upper chest: Osteopenia. Visible upper thoracic levels appear intact. Moderate to large new layering pleural effusions since last year visible in the lung apices. Simple fluid density suggesting transudate. IMPRESSION: 1. No acute traumatic injury identified in the cervical spine. Stable chronic cervical spine degeneration. 2. Moderate to large layering pleural effusions visible in the lung apices and new from cervical spine CT last year. 3. Chronic severe calcified carotid atherosclerosis worse on the right. Electronically Signed   By: Genevie Ann M.D.   On: 04/10/2022 09:56   CT HEAD WO CONTRAST  Result Date: 04/10/2022 CLINICAL DATA:  89 year old male found down at 0730 hours today. Atrial fibrillation, on warfarin. EXAM: CT HEAD WITHOUT CONTRAST TECHNIQUE: Contiguous axial images were obtained from the base of the skull through the vertex without intravenous contrast. RADIATION DOSE REDUCTION: This exam was performed according to the departmental dose-optimization program which includes automated exposure control, adjustment of the mA and/or kV according to patient size and/or use of iterative reconstruction technique. COMPARISON:   Head CT 04/27/2021. FINDINGS: Brain: Stable cerebral volume since last year. No midline shift, ventriculomegaly, mass effect, evidence of mass lesion, intracranial hemorrhage or evidence of cortically based acute infarction. Stable small chronic appearing cerebellar infarcts. Patchy mild to moderate for age cerebral white matter hypodensity does not appear significantly changed. Vascular: Calcified atherosclerosis at the skull base. No suspicious intracranial vascular hyperdensity. Skull: Motion artifact at the skull base. No acute osseous abnormality identified. Sinuses/Orbits: Visualized paranasal sinuses and mastoids are stable and well aerated. Other: Calcified scalp vessel atherosclerosis. No orbit or scalp soft tissue injury identified. IMPRESSION: 1. Mild motion artifact. No acute intracranial abnormality or acute traumatic injury identified. 2. Stable non contrast CT  appearance of chronic small vessel disease. Electronically Signed   By: Genevie Ann M.D.   On: 04/10/2022 09:53    Procedures Procedures    Medications Ordered in ED Medications  lactated ringers bolus 250 mL (has no administration in time range)  acetaminophen (TYLENOL) tablet 650 mg (has no administration in time range)  lidocaine (LIDODERM) 5 % 1 patch (has no administration in time range)  furosemide (LASIX) injection 40 mg (has no administration in time range)  iohexol (OMNIPAQUE) 350 MG/ML injection 75 mL (75 mLs Intravenous Contrast Given 04/10/22 0948)    ED Course/ Medical Decision Making/ A&P                             Medical Decision Making Amount and/or Complexity of Data Reviewed Labs: ordered. Radiology: ordered. ECG/medicine tests: ordered.  Risk Prescription drug management.   This patient presents to the ED for concern of fall, this involves an extensive number of treatment options, and is a complaint that carries with it a high risk of complications and morbidity.  The differential diagnosis includes  acute injuries   Co morbidities that complicate the patient evaluation  HTN, CAD, atrial fibrillation, CHF, anxiety, COPD, cirrhosis, BPH, CKD, aortic stenosis s/p TAVR, sick sinus syndrome, GERD, AAA   Additional history obtained:  Additional history obtained from EMS, patient's son External records from outside source obtained and reviewed including EMR   Lab Tests:  I Ordered, and personally interpreted labs.  The pertinent results include: Creatinine is slightly increased from baseline, hemoglobin is normal, no leukocytosis present.  Thrombocytopenia appears to be baseline.  CK and lactate are normal.  Electrolytes are unremarkable.  INR is therapeutic.   Imaging Studies ordered:  I ordered imaging studies including CT of head, cervical spine, chest, abdomen, pelvis I independently visualized and interpreted imaging which showed acute rib fractures on right side, numbers 5 and 6.  No other acute findings. I agree with the radiologist interpretation   Cardiac Monitoring: / EKG:  The patient was maintained on a cardiac monitor.  I personally viewed and interpreted the cardiac monitored which showed an underlying rhythm of: Atrial fibrillation  Problem List / ED Course / Critical interventions / Medication management  Patient presents after unwitnessed fall.  This occurred at the skilled nursing facility.  He is reportedly alert and oriented at baseline was noted to be confused after his fall.  He is on warfarin for history of atrial fibrillation.  On arrival, patient denies any areas of discomfort.  He is able to move all extremities, answer questions, and follow commands.  He is, however, oriented only to self.  Given his fall on blood thinners and subsequent altered mental status, patient was made a level 2 trauma.  Lab work and imaging studies were ordered.  Lab work is notable for possible slight increase in creatinine from baseline.  CT imaging showed 2 minimally displaced  right-sided rib fractures without other acute findings.  On reassessment, patient remains somnolent but arousable.  He continues to be disoriented and is unable to have a conversation.  I spoke with his son, Mr. Garnett Goff, who talks to his father every day.  He did stop by the ED to visit with his father.  Patient's son reports that his father is typically alert and oriented x 4.  He is able to have conversations about the stock market in about past stories.  Today was the first time  that his son saw him confused.  Son does note that he has seemed more frail over the past several weeks.  He has been receiving Lasix for diuresis and son was concerned about possible dehydration.  Gentle IV fluids were ordered.  On review of his medications, he does not appear to be on any medications that might explain his encephalopathy.  Although CT imaging of head was negative, patient may have sustained a concussion.  Alternatively, hypercarbia remains on differential given his recent chest wall injury.  Blood gas was ordered.  Patient continues to maintain normal SpO2 on room air.  Given his ongoing encephalopathy, patient was admitted to medicine for further management. I ordered medication including Tylenol and lidocaine patch for analgesia; IVF for gentle hydration Reevaluation of the patient after these medicines showed that the patient improved I have reviewed the patients home medicines and have made adjustments as needed   Social Determinants of Health:  Resides in nursing facility  CRITICAL CARE Performed by: Godfrey Pick   Total critical care time: 35 minutes  Critical care time was exclusive of separately billable procedures and treating other patients.  Critical care was necessary to treat or prevent imminent or life-threatening deterioration.  Critical care was time spent personally by me on the following activities: development of treatment plan with patient and/or surrogate as well as nursing,  discussions with consultants, evaluation of patient's response to treatment, examination of patient, obtaining history from patient or surrogate, ordering and performing treatments and interventions, ordering and review of laboratory studies, ordering and review of radiographic studies, pulse oximetry and re-evaluation of patient's condition.         Final Clinical Impression(s) / ED Diagnoses Final diagnoses:  Fall, initial encounter  Acute encephalopathy  Closed fracture of multiple ribs of right side, initial encounter    Rx / DC Orders ED Discharge Orders     None         Godfrey Pick, MD 04/10/22 1311

## 2022-04-10 NOTE — Telephone Encounter (Signed)
Spoke with the patient's son and advised that I will make Scott aware of patient's hospitalization.

## 2022-04-10 NOTE — H&P (Addendum)
History and Physical    Tyler Williams [id number] DOB: January 22, 1927 DOA: 04/10/2022  PCP: Burnard Bunting, MD (Confirm with patient/family/NH records and if not entered, this has to be entered at [facility] point of entry) Patient coming from: Assisted living  I have personally briefly reviewed patient's old medical records in [place]  Chief Complaint: Patient is confused  HPI: Tyler Williams is a 89 y.o. male with medical history significant of chronic A-fib on Coumadin, chronic HFrEF LVEF 40-45%, chronic right-sided heart failure with cor pulmonale, cirrhosis, CAD, aortic stenosis status post TAVR, macular degeneration with poor vision, GERD, BPH, was found on the floor confused.  Patient answered some of my questions but unable to provide any detailed history, or history provided by son over the phone.  Son reported that the patient was last seen normal yesterday afternoon with normal conversation with family and no complaints.  But 4 days ago, when son visited patient he found that the patient " swelling" and patient told him that he gained 15 pounds over the last 3 weeks and doctor has been giving him increased dose of Lasix without significant improvement.  Same day Thursday, when patient was standing up send found the patient very unsteady despite on his walker but which patient attributed to his baseline macular degeneration, but patient refused to use wheelchair at this point as offered but the son. This morning,  patient at this morning, patient was found on the floor confused.  Patient denies any dysuria or diarrhea.  ED Course: Patient was found to be in chronic A-fib borderline bradycardia blood pressure elevated, trauma scan including CT head cervical spine chest and abdomen pelvis showed no intracranial bleeding fracture dislocation of brain or cervical spine, right 5 and 6 rib fracture and significant bilateral pleural effusion large amount, and anasarca.  Blood work  showed slight elevation of BUN 47 creatinine 1.3 compared to baseline.  UA pending.  Review of Systems: unable to perform, patient is confused  Past Medical History:  Diagnosis Date   AAA (abdominal aortic aneurysm) ([place])    s/p repair   Aortic atherosclerosis (HCC)    Arthritis    knees   CAD (coronary artery disease)    a. s/p POBA 1980s, 1990s (details unclear). b. known stenotic RCA (previous unsuccessful intervention)   Cancer ([place])    skin cancer removed, right hand, left leg, chest   Carotid artery disease (HCC)    Chronic anticoagulation    Coumadin   Chronic kidney disease, stage 3a (HCC)    Cirrhosis of liver ([place])    by imaging   Coronary artery disease    GERD (gastroesophageal reflux disease)    Heart failure with mildly reduced ejection fraction (HFmrEF) ([place])    History of pulmonary embolism    1964 related to dislocated hip on right    History of skin cancer    Hypercholesteremia    Hypertension    Incidental cecal mass noted on CT imaging 05/24/2016   **An incidental finding of potential clinical significance has been found. 4.6 x 5.6 x 3.7 cm masslike lesion in the cecum adjacent to the ileocecal valve may represent a large polyp or colonic neoplasm. Correlation with nonemergent colonoscopy is strongly recommended in the near future to better evaluate this finding.**   Macular degeneration    eye injections   Microscopic hematuria    Followed by urology   Permanent atrial fibrillation ([place])    Pulmonary hypertension (HCC)    PVD (  peripheral vascular disease) ([place])    Bilateral renal artery atherosclerosis, SMA stenosis with collateralization   RBBB    Chronic   S/P TAVR (transcatheter aortic valve replacement) 06/13/2016   29 mm Edwards Sapien 3 transcatheter heart valve placed via percutaneous right transfemoral approach   Severe aortic stenosis    s/p TAVR   SSS (sick sinus syndrome) ([place])     Past Surgical History:  Procedure Laterality Date    ABDOMINAL AORTIC ANEURYSM REPAIR     1993    CARDIAC CATHETERIZATION  05/24/2012   pD1 20%, oCFX 20%, mCFX 30%, ostial Int Br 30%, distal AV groove CFX 30%, pRCA 30-40%, mRCA 99%, dRCA 30%, PL branch small with diffuse 40%. Unable to pass wire past RCA lesion->medically managed   CHOLECYSTECTOMY N/A 12/05/2012   Procedure: LAPAROSCOPIC CHOLECYSTECTOMY ;  Surgeon: Edward Jolly, MD;  Location: [facility];  Service: General;  Laterality: N/A;   [place] N/A 08/31/2017   Procedure: CORONARY BALLOON ANGIOPLASTY;  Surgeon: Martinique, Peter M, MD;  Location: [facility];  Service: Cardiovascular;  Laterality: N/A;   EYE SURGERY     hx of cataract surgery    [place]     right inguinal hernia repair    JOINT REPLACEMENT     left knee replacement, partial right   LEFT HEART CATH AND CORONARY ANGIOGRAPHY N/A 08/31/2017   Procedure: LEFT HEART CATH AND CORONARY ANGIOGRAPHY;  Surgeon: Martinique, Peter M, MD;  Location: [place];  Service: Cardiovascular;  Laterality: N/A;   LEFT HEART CATHETERIZATION WITH CORONARY ANGIOGRAM N/A 05/24/2012   Procedure: LEFT HEART CATHETERIZATION WITH CORONARY ANGIOGRAM;  Surgeon: Burnell Blanks, MD;  Location: [facility] CATH LAB;  Service: Cardiovascular;  Laterality: N/A;   ORTHOPEDIC SURGERY     OTHER SURGICAL HISTORY     right knee popliteal aneurysm surgery stent placed    OTHER SURGICAL HISTORY     PARTIAL KNEE ARTHROPLASTY  01/22/2012   Procedure: UNICOMPARTMENTAL KNEE;  Surgeon: Mauri Pole, MD;  Location: [facility];  Service: Orthopedics;  Laterality: Right;   PERCUTANEOUS CORONARY INTERVENTION-BALLOON ONLY  05/24/2012   Procedure: PERCUTANEOUS CORONARY INTERVENTION-BALLOON ONLY;  Surgeon: Burnell Blanks, MD;  Location: [facility] CATH LAB;  Service: Cardiovascular;;   RIGHT/LEFT HEART CATH AND CORONARY ANGIOGRAPHY N/A 05/12/2016   Procedure:  Right/Left Heart Cath and Coronary Angiography;  Surgeon: Sherren Mocha, MD;  Location: [place];  Service: Cardiovascular;  Laterality: N/A;   TEE WITHOUT CARDIOVERSION N/A 06/13/2016   Procedure: TRANSESOPHAGEAL ECHOCARDIOGRAM (TEE);  Surgeon: Sherren Mocha, MD;  Location: [facility];  Service: Open Heart Surgery;  Laterality: N/A;   TONSILLECTOMY     TOTAL KNEE ARTHROPLASTY Left    TRANSCATHETER AORTIC VALVE REPLACEMENT, TRANSFEMORAL N/A 06/13/2016   Procedure: TRANSCATHETER AORTIC VALVE REPLACEMENT, TRANSFEMORAL;  Surgeon: Sherren Mocha, MD;  Location: [facility];  Service: Open Heart Surgery;  Laterality: N/A;     reports that he has never smoked. He has never used smokeless tobacco. He reports that he does not currently use alcohol. He reports that he does not use drugs.  No Known Allergies  Family History  Problem Relation Age of Onset   Heart attack Mother 14    Prior to Admission medications   Medication Sig Start Date End Date Taking? Authorizing Provider  acetaminophen (TYLENOL) 500 MG tablet Take 500  mg by mouth every 4 (four) hours as needed for moderate pain.    [provider]  atorvastatin (LIPITOR) 20 MG tablet TAKE 1 TABLET ONCE DAILY. 12/30/20   Sherren Mocha, MD  Calcium-Magnesium-Zinc (CAL-MAG-ZINC PO) Take 1 tablet by mouth every morning.    [provider]  Cholecalciferol (VITAMIN D-3) 125 MCG (5000 UT) TABS Take 5,000 Units by mouth every morning.    [provider]  fesoterodine (TOVIAZ) 8 MG TB24 tablet Take 8 mg by mouth daily.    [provider]  finasteride (PROSCAR) 5 MG tablet Take 1 tablet (5 mg total) by mouth daily. 06/21/20   Charlynne Cousins, MD  furosemide (LASIX) 40 MG tablet Take 1.5 tablets (60 mg total) by mouth daily. 12/05/21   [name] T, PA-C  isosorbide mononitrate (IMDUR) 60 MG 24 hr tablet Take 1 tablet (60 mg total) by mouth daily. 11/25/21   Domenic Polite, MD  LORazepam (ATIVAN) 1 MG tablet  Take 1 mg by mouth 2 (two) times daily as needed for anxiety.    [provider]  metoprolol succinate (TOPROL XL) 25 MG 24 hr tablet Take 0.5 tablets (12.5 mg total) by mouth daily. 10/05/21   [name] T, PA-C  Multiple Vitamins-Minerals (PRESERVISION AREDS) TABS Take 1 tablet by mouth daily. 03/04/09   [provider]  nitroGLYCERIN (NITROSTAT) 0.4 MG SL tablet Place 0.4 mg under the tongue every 5 (five) minutes as needed for chest pain.    [provider]  nystatin cream (MYCOSTATIN) Apply 1 Application topically 2 (two) times daily. 11/28/21   [provider]  polyethylene glycol (MIRALAX / GLYCOLAX) 17 g packet Take 17 g by mouth daily as needed. 12/26/21   Lorella Nimrod, MD  Polyethylene Glycol 3350 POWD Take 17 g by mouth daily.    [provider]  sacubitril-valsartan (ENTRESTO) 24-26 MG Take 1 tablet by mouth 2 (two) times daily. Please keep upcoming appointment for future refills thank you. 07/04/21   Sherren Mocha, MD  spironolactone (ALDACTONE) 25 MG tablet Take 1 tablet (25 mg total) by mouth daily. 10/05/21   [name] T, PA-C  tamsulosin (FLOMAX) 0.4 MG CAPS capsule Take 1 capsule (0.4 mg total) by mouth daily. 06/21/20   Charlynne Cousins, MD  warfarin (COUMADIN) 2.5 MG tablet Take 2.5 mg by mouth See admin instructions. Take 2.5 mg by mouth daily on Monday, Tuesday, Wednesday, Thursday, and Friday. 11/30/21   [provider]  warfarin (COUMADIN) 2.5 MG tablet Take 1.25 mg by mouth See admin instructions. Take 1.25 mg by mouth on Saturday and Sunday.    [provider]    Physical Exam: Vitals:   04/10/22 1100 04/10/22 1130 04/10/22 1230 04/10/22 1243  BP: (!) 152/67 (!) 148/65 (!) 152/63   Pulse: 63 (!) 48 66   Resp: '15 15 17   '$ Temp:    97.7 F (36.5 C)  TempSrc:    Oral  SpO2: 98% 99% 98%   Weight:      Height:        Constitutional: NAD, calm, comfortable Vitals:   04/10/22 1100 04/10/22 1130  04/10/22 1230 04/10/22 1243  BP: (!) 152/67 (!) 148/65 (!) 152/63   Pulse: 63 (!) 48 66   Resp: '15 15 17   '$ Temp:    97.7 F (36.5 C)  TempSrc:    Oral  SpO2: 98% 99% 98%   Weight:      Height:  Eyes: PERRL, lids and conjunctivae normal ENMT: Mucous membranes are moist. Posterior pharynx clear of any exudate or lesions.Normal dentition.  Neck: normal, supple, no masses, no thyromegaly.  JVD 6 cm above clavicles Respiratory: clear to auscultation bilaterally, no wheezing, bilateral crackles to the mid field, increasing breathing over. No accessory muscle use.  Cardiovascular: Irregular heart rate, no murmurs / rubs / gallops.  Anasarca to the shin, thigh and lower abdominal wall. 2+ pedal pulses. No carotid bruits.  Abdomen: no tenderness, no masses palpated. No hepatosplenomegaly. Bowel sounds positive.  Musculoskeletal: no clubbing / cyanosis. No joint deformity upper and lower extremities. Good ROM, no contractures. Normal muscle tone.  Skin: no rashes, lesions, ulcers. No induration Neurologic: No facial droops, moving all limbs, following simple commands Psychiatric: Easily arousable, oriented to himself, confused about time and place    Labs on Admission: I have personally reviewed following labs and imaging studies  CBC: Recent Labs  Lab 04/10/22 0858 04/10/22 0906  WBC 4.5  --   HGB 13.0 13.6  HCT 39.9 40.0  MCV 92.6  --   PLT 88*  --    Basic Metabolic Panel: Recent Labs  Lab 04/10/22 0858 04/10/22 0906  NA 138 140  K 4.5 4.5  CL 103 105  CO2 25  --   GLUCOSE 93 87  BUN 43* 47*  CREATININE 1.31* 1.30*  CALCIUM 9.2  --    GFR: Estimated Creatinine Clearance: 33.6 mL/min (A) (by C-G formula based on SCr of 1.3 mg/dL (H)). Liver Function Tests: Recent Labs  Lab 04/10/22 0858  AST 25  ALT 13  ALKPHOS 98  BILITOT 1.5*  PROT 6.8  ALBUMIN 3.2*   No results for input(s): "LIPASE", "AMYLASE" in the last 168 hours. No results for input(s): "AMMONIA"  in the last 168 hours. Coagulation Profile: Recent Labs  Lab 04/10/22 0858  INR 2.6*   Cardiac Enzymes: Recent Labs  Lab 04/10/22 0858  CKTOTAL 52   BNP (last 3 results) No results for input(s): "PROBNP" in the last 8760 hours. HbA1C: No results for input(s): "HGBA1C" in the last 72 hours. CBG: No results for input(s): "GLUCAP" in the last 168 hours. Lipid Profile: No results for input(s): "CHOL", "HDL", "LDLCALC", "TRIG", "CHOLHDL", "LDLDIRECT" in the last 72 hours. Thyroid Function Tests: No results for input(s): "TSH", "T4TOTAL", "FREET4", "T3FREE", "THYROIDAB" in the last 72 hours. Anemia Panel: No results for input(s): "VITAMINB12", "FOLATE", "FERRITIN", "TIBC", "IRON", "RETICCTPCT" in the last 72 hours. Urine analysis:    Component Value Date/Time   COLORURINE YELLOW 12/22/2021 1456   APPEARANCEUR HAZY (A) 12/22/2021 1456   APPEARANCEUR Clear 10/05/2021 1516   LABSPEC 1.011 12/22/2021 1456   PHURINE 5.0 12/22/2021 1456   GLUCOSEU NEGATIVE 12/22/2021 1456   HGBUR SMALL (A) 12/22/2021 1456   BILIRUBINUR NEGATIVE 12/22/2021 1456   BILIRUBINUR Negative 10/05/2021 1516   KETONESUR NEGATIVE 12/22/2021 1456   PROTEINUR NEGATIVE 12/22/2021 1456   UROBILINOGEN 0.2 09/27/2013 2341   NITRITE POSITIVE (A) 12/22/2021 1456   LEUKOCYTESUR LARGE (A) 12/22/2021 1456    Radiological Exams on Admission: CT CHEST ABDOMEN PELVIS W CONTRAST  Result Date: 04/10/2022 CLINICAL DATA:  89 year old male found down at 0730 hours today. Atrial fibrillation, on warfarin. EXAM: CT CHEST, ABDOMEN, AND PELVIS WITH CONTRAST TECHNIQUE: Multidetector CT imaging of the chest, abdomen and pelvis was performed following the standard protocol during bolus administration of intravenous contrast. RADIATION DOSE REDUCTION: This exam was performed according to the departmental dose-optimization program which includes automated exposure  control, adjustment of the mA and/or kV according to patient size and/or  use of iterative reconstruction technique. CONTRAST:  17m OMNIPAQUE IOHEXOL 350 MG/ML SOLN COMPARISON:  Cervical spine CT today. Prior chest CT 12/05/2019 and CT Abdomen and Pelvis 02/08/2018. FINDINGS: CT CHEST FINDINGS Cardiovascular: Chronic aortic valve replacement or TAVR. Cardiomegaly appears mildly increased since 2019. Calcified aortic atherosclerosis. Calcified coronary artery atherosclerosis. No pericardial effusion. Thoracic aorta and other central mediastinal vascular structures appear intact. Mediastinum/Nodes: No mediastinal hematoma or lymphadenopathy identified. Lungs/Pleura: Fairly large layering bilateral pleural effusions, only trace on the 2021 chest CT. Simple fluid density favoring transudate. Superimposed mild respiratory motion. Major airways remain patent. Compressive bilateral lower lobe atelectasis. Lingula atelectasis and/or some pleural fluid tracking in the major fissure there. No pneumothorax. No convincing pulmonary contusion. Musculoskeletal: Osteopenia. Mild T4 superior endplate compression is new since the 2021 chest CT, but is visible on previous two-view chest x-ray 12/22/2021 and chronic. No retropulsion. Other thoracic vertebral height appears stable. No sternal fracture. Visible shoulder osseous structures appear intact. Right anterior minimally displaced 5th rib fracture on series 4, image 106 is new since 2021, as is a similar fracture of the anterior 6th rib on image 120. No other right rib fracture identified. No left rib fracture identified. CT ABDOMEN PELVIS FINDINGS Hepatobiliary: Nodular and cirrhotic liver. Chronically absent gallbladder. No discrete liver mass. Perihepatic ascites has progressed since 2019. Pancreas: Negative. Spleen: No splenomegaly.  Perisplenic ascites. Adrenals/Urinary Tract: No adrenal or renal injury identified. Chronic benign renal cysts (no follow-up imaging recommended). Symmetric renal enhancement and early contrast excretion. Decompressed  ureters. Mild to moderate bladder distension. Stomach/Bowel: Redundant large bowel with extensive descending and sigmoid diverticulosis. Scattered free fluid/ascites in the abdomen. No definite bowel wall thickening. No dilated small bowel. Evidence of a normal appendix on coronal image 67. Decompressed stomach and duodenum. No free air identified. Vascular/Lymphatic: Extensive Aortoiliac calcified atherosclerosis. Status post bilateral iliac artery bypass, stable. Major arterial structures remain patent. On the delayed images the portal venous system appears to be patent. No lymphadenopathy identified. Reproductive: New ascites fluid containing left inguinal hernia since 2019. Other: Small volume simple fluid density ascites in the pelvis, similar to that in 2019. Musculoskeletal: Widespread chronic lumbar disc and endplate degeneration with degenerative lumbar scoliosis. No acute lumbar, sacral, pelvic, or proximal femur fracture identified. Partial bilateral SI joint ankylosis. Generalized abdominal and pelvic body wall edema/anasarca is new since 2019. IMPRESSION: 1. Acute minimally displaced fractures of the right anterior 5th and 6th ribs. 2. No pneumothorax, pulmonary contusion, or other acute traumatic injury identified in the chest, abdomen, or pelvis. 3. Chronic Cirrhosis and Cardiomegaly with Anasarca, increased Ascites from prior CTs, and Large layering pleural effusions with compressive atelectasis. 4.  Aortic Atherosclerosis (ICD10-I70.0). Electronically Signed   By: HGenevie AnnM.D.   On: 04/10/2022 10:11   CT CERVICAL SPINE WO CONTRAST  Result Date: 04/10/2022 CLINICAL DATA:  89year old male found down at 0730 hours today. Atrial fibrillation, on warfarin. EXAM: CT CERVICAL SPINE WITHOUT CONTRAST TECHNIQUE: Multidetector CT imaging of the cervical spine was performed without intravenous contrast. Multiplanar CT image reconstructions were also generated. RADIATION DOSE REDUCTION: This exam was  performed according to the departmental dose-optimization program which includes automated exposure control, adjustment of the mA and/or kV according to patient size and/or use of iterative reconstruction technique. COMPARISON:  Head CT today.  Cervical spine CT 04/27/2021. FINDINGS: Alignment: Stable. Straightening of cervical lordosis with chronic degenerative appearing anterolisthesis of C2 on C3 and  C7 on T1. Bilateral posterior element alignment is within normal limits. Skull base and vertebrae: Mild motion artifact. Visualized skull base is intact. No atlanto-occipital dissociation. C1 and C2 appear intact and aligned. No acute osseous abnormality identified. Soft tissues and spinal canal: No prevertebral fluid or swelling. No visible canal hematoma. Severe chronic right cervical calcified atherosclerosis. Bulky contralateral left carotid calcified atherosclerosis. Stable visible noncontrast neck soft tissues. Disc levels: Stable chronic cervical spine degeneration. Up to mild suspected cervical spinal stenosis. Upper chest: Osteopenia. Visible upper thoracic levels appear intact. Moderate to large new layering pleural effusions since last year visible in the lung apices. Simple fluid density suggesting transudate. IMPRESSION: 1. No acute traumatic injury identified in the cervical spine. Stable chronic cervical spine degeneration. 2. Moderate to large layering pleural effusions visible in the lung apices and new from cervical spine CT last year. 3. Chronic severe calcified carotid atherosclerosis worse on the right. Electronically Signed   By: Genevie Ann M.D.   On: 04/10/2022 09:56   CT HEAD WO CONTRAST  Result Date: 04/10/2022 CLINICAL DATA:  89 year old male found down at 0730 hours today. Atrial fibrillation, on warfarin. EXAM: CT HEAD WITHOUT CONTRAST TECHNIQUE: Contiguous axial images were obtained from the base of the skull through the vertex without intravenous contrast. RADIATION DOSE REDUCTION: This  exam was performed according to the departmental dose-optimization program which includes automated exposure control, adjustment of the mA and/or kV according to patient size and/or use of iterative reconstruction technique. COMPARISON:  Head CT 04/27/2021. FINDINGS: Brain: Stable cerebral volume since last year. No midline shift, ventriculomegaly, mass effect, evidence of mass lesion, intracranial hemorrhage or evidence of cortically based acute infarction. Stable small chronic appearing cerebellar infarcts. Patchy mild to moderate for age cerebral white matter hypodensity does not appear significantly changed. Vascular: Calcified atherosclerosis at the skull base. No suspicious intracranial vascular hyperdensity. Skull: Motion artifact at the skull base. No acute osseous abnormality identified. Sinuses/Orbits: Visualized paranasal sinuses and mastoids are stable and well aerated. Other: Calcified scalp vessel atherosclerosis. No orbit or scalp soft tissue injury identified. IMPRESSION: 1. Mild motion artifact. No acute intracranial abnormality or acute traumatic injury identified. 2. Stable non contrast CT appearance of chronic small vessel disease. Electronically Signed   By: Genevie Ann M.D.   On: 04/10/2022 09:53    EKG: Independently reviewed.  A-fib with frequent PVCs  Assessment/Plan Principal Problem:   CHF (congestive heart failure) (HCC) Active Problems:   Acute on chronic combined systolic and diastolic CHF (congestive heart failure) (HCC)   Essential hypertension   Chronic kidney disease, stage 2 (mild)   Cirrhosis of liver ([place])   Fall   Acute encephalopathy  (please populate well all problems here in Problem List. (For example, if patient is on BP meds at home and you resume or decide to hold them, it is a problem that needs to be her. Same for CAD, COPD, HLD and so on)  Acute metabolic encephalopathy -Unknown etiology, rule out UTI, rule out decompensated cirrhosis -UA  pending, -Ammonium level stat -Other DDx, CT head within normal limits, however may consider repeat CT head tomorrow if mentation no significant improvement to rule out any intracranial bleed as patient is on chronic Coumadin.  Syncope cannot be ruled out as patient appears to be in acute CHF/cirrhosis decompensation, diuresis and check orthostatic vital sign -No focal neurodeficit, and patient is on Coumadin, less suspicion of stroke.  No signs of intracranial bleed on CT scan.  Acute on chronic  HFrEF decompensation, failed outpatient management -Significant anasarca and large bilateral pleural effusion -Start aggressive diuresis with Lasix 40 mg IV twice daily -Echocardiogram was done less than 3 months ago, will not repeat at this point.  Acute on chronic cirrhosis decompensation -Significant fluid overload -Diuresis as above -Ammonia level pending -No significant ascites on CT scanning, and no significant ascites signs on physical exam, low suspicion for SBP, hold off antibiotics for now.  Chronic A-fib -Rate controlled, blood pressure stable -Continue current rate control regimen of long-acting metoprolol -Resume Coumadin  AS status post TAVR -No cardiac murmur, normal TAVR structure on last echo 3 months ago  5th-6th rib fracture -No symptoms of flail chest -Pain control, lidocaine patch  Macular degeneration -Chronic ambulation dysfunction, PT evaluation to follow  DVT prophylaxis: Coumadin Code Status: DNR Family Communication: Son over the phone Disposition Plan: Sick with significant CHF decompensation, requiring IV diuresis, expect more than 2 midnight hospital stay Consults called: None Admission status: Telemetry admission   Lequita Halt MD [facility] Pager [phone]  04/10/2022, 1:17 PM

## 2022-04-10 NOTE — Progress Notes (Signed)
..Trauma Response Nurse Documentation   Tyler Williams is a 89 y.o. male arriving to [facility] ED via EMS  On warfarin daily. Trauma was activated as a Level 2 by Dr. Doren Custard based on the following trauma criteria Elderly patients > 65 with head trauma on anti-coagulation (excluding ASA). Trauma team at the bedside on patient arrival.   Patient cleared for CT by Dr. Doren Custard. Pt transported to CT with trauma response nurse present to monitor. RN remained with the patient throughout their absence from the department for clinical observation.   GCS 4/4/6.  History   Past Medical History:  Diagnosis Date   AAA (abdominal aortic aneurysm) (HCC)    s/p repair   Aortic atherosclerosis (HCC)    Arthritis    knees   CAD (coronary artery disease)    a. s/p POBA 1980s, 1990s (details unclear). b. known stenotic RCA (previous unsuccessful intervention)   Cancer ([place])    skin cancer removed, right hand, left leg, chest   Carotid artery disease (HCC)    Chronic anticoagulation    Coumadin   Chronic kidney disease, stage 3a (HCC)    Cirrhosis of liver ([place])    by imaging   Coronary artery disease    GERD (gastroesophageal reflux disease)    Heart failure with mildly reduced ejection fraction (HFmrEF) ([place])    History of pulmonary embolism    1964 related to dislocated hip on right    History of skin cancer    Hypercholesteremia    Hypertension    Incidental cecal mass noted on CT imaging 05/24/2016   **An incidental finding of potential clinical significance has been found. 4.6 x 5.6 x 3.7 cm masslike lesion in the cecum adjacent to the ileocecal valve may represent a large polyp or colonic neoplasm. 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 Unable to pass wire past RCA lesion->medically managed   CHOLECYSTECTOMY N/A 12/05/2012   Procedure: LAPAROSCOPIC CHOLECYSTECTOMY ;  Surgeon: Edward Jolly, MD;  Location: [facility];  Service: General;  Laterality: N/A;   [place] N/A 08/31/2017   Procedure: CORONARY BALLOON ANGIOPLASTY;  Surgeon: Martinique, Peter M, MD;  Location: [place] [facility] CV [facility];  Service: Cardiovascular;  Laterality: N/A;   EYE SURGERY     hx of cataract surgery    HEMORRHOID SURGERY     1973   HERNIA REPAIR     right inguinal hernia repair    JOINT  REPLACEMENT     left knee replacement, partial right   LEFT HEART CATH AND CORONARY ANGIOGRAPHY N/A 08/31/2017   Procedure: LEFT HEART CATH AND CORONARY ANGIOGRAPHY;  Surgeon: Martinique, Peter M, MD;  Location: [place] CV LAB;  Service: Cardiovascular;  Laterality: N/A;   LEFT HEART CATHETERIZATION WITH CORONARY ANGIOGRAM N/A 05/24/2012   Procedure: LEFT HEART CATHETERIZATION WITH CORONARY ANGIOGRAM;  Surgeon: Burnell Blanks, MD;  Location: [facility] CATH LAB;  Service: Cardiovascular;  Laterality: N/A;   ORTHOPEDIC SURGERY     OTHER SURGICAL HISTORY     right knee popliteal aneurysm surgery stent placed    OTHER SURGICAL HISTORY     PARTIAL KNEE  ARTHROPLASTY  01/22/2012   Procedure: UNICOMPARTMENTAL KNEE;  Surgeon: Mauri Pole, MD;  Location: [facility];  Service: Orthopedics;  Laterality: Right;   PERCUTANEOUS CORONARY INTERVENTION-BALLOON ONLY  05/24/2012   Procedure: PERCUTANEOUS CORONARY INTERVENTION-BALLOON ONLY;  Surgeon: Burnell Blanks, MD;  Location: [facility] CATH LAB;  Service: Cardiovascular;;   RIGHT/LEFT HEART CATH AND CORONARY ANGIOGRAPHY N/A 05/12/2016   Procedure: Right/Left Heart Cath and Coronary Angiography;  Surgeon: Sherren Mocha, MD;  Location: [place] CV LAB;  Service: Cardiovascular;  Laterality: N/A;   TEE WITHOUT CARDIOVERSION N/A 06/13/2016   Procedure: TRANSESOPHAGEAL ECHOCARDIOGRAM (TEE);  Surgeon: Sherren Mocha, MD;  Location: [place];  Service: Open Heart Surgery;  Laterality: N/A;   TONSILLECTOMY     TOTAL KNEE ARTHROPLASTY Left    TRANSCATHETER AORTIC VALVE REPLACEMENT, TRANSFEMORAL N/A 06/13/2016   Procedure: TRANSCATHETER AORTIC VALVE REPLACEMENT, TRANSFEMORAL;  Surgeon: Sherren Mocha, MD;  Location: [place];  Service: Open Heart Surgery;  Laterality: N/A;       Initial Focused Assessment (If applicable, or please see trauma documentation): Please see trauma documentation   CT's Completed:   CT Head, CT C-Spine, and CT Chest w/ contrast   Interventions:  #20g PIV 12 lead Labs CT Warm Blankets  Plan for disposition:  pending   Event Summary: Pt BIB EMS from nursing facility. Pt was seen in bed at 0700. Per report, at 0730 pt was found on ground by chair and EMS was called. Pt arrived to ED in NAD. HC in place. 22g in R hand. Level 2 activation per EDP upon arrival. Placed to monitor. Pt hypertensive. Afib, rate controlled. GCS 4/4/6, which is baseline per nursing facility. 12 lead EKG done. #20g PIV placed in L FA. Labs sent. Creatinine 1.3 on iStat. Transported to CT. Tolerated well. Warm blankets given upon return to ED-31.    Bedside handoff with ED RN [name].    Renard Hamper   Trauma Response RN  Please call TRN at [phone] for further assistance.

## 2022-04-10 NOTE — Progress Notes (Signed)
ANTICOAGULATION CONSULT NOTE - Initial Consult  Pharmacy Consult for warfarin Indication: atrial fibrillation  No Known Allergies  Patient Measurements: Height: '5\' 8"'$  (172.7 cm) Weight: 82.7 kg (182 lb 5.1 oz) IBW/kg (Calculated) : 68.4   Vital Signs: Temp: 97.7 F (36.5 C) (02/26 1805) Temp Source: Oral (02/26 1805) BP: 181/79 (02/26 1805) Pulse Rate: 71 (02/26 1805)  Labs: Recent Labs    04/10/22 0858 04/10/22 0906 04/10/22 1333  HGB 13.0 13.6 12.2*  HCT 39.9 40.0 36.0*  PLT 88*  --   --   LABPROT 27.7*  --   --   INR 2.6*  --   --   CREATININE 1.31* 1.30*  --   CKTOTAL 52  --   --     Estimated Creatinine Clearance: 36.4 mL/min (A) (by C-G formula based on SCr of 1.3 mg/dL (H)).   Medical History: Past Medical History:  Diagnosis Date   AAA (abdominal aortic aneurysm) ([place])    s/p repair   Aortic atherosclerosis (HCC)    Arthritis    knees   CAD (coronary artery disease)    a. s/p POBA 1980s, 1990s (details unclear). b. known stenotic RCA (previous unsuccessful intervention)   Cancer ([place])    skin cancer removed, right hand, left leg, chest   Carotid artery disease (HCC)    Chronic anticoagulation    Coumadin   Chronic kidney disease, stage 3a (HCC)    Cirrhosis of liver ([place])    by imaging   Coronary artery disease    GERD (gastroesophageal reflux disease)    Heart failure with mildly reduced ejection fraction (HFmrEF) ([place])    History of pulmonary embolism    1964 related to dislocated hip on right    History of skin cancer    Hypercholesteremia    Hypertension    Incidental cecal mass noted on CT imaging 05/24/2016   **An incidental finding of potential clinical significance has been found. 4.6 x 5.6 x 3.7 cm masslike lesion in the cecum adjacent to the ileocecal valve may represent a large polyp or colonic neoplasm. 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   29 mm Edwards Sapien 3 transcatheter heart valve placed via percutaneous right transfemoral approach   Severe aortic stenosis    s/p TAVR   SSS (sick sinus syndrome) ([place])     Assessment: 67 YOM presenting from facility found down, hx of afib on warfarin PTA, INR on admission is therapeutic at 2.6, last dose administered at facility on 2/25  PTA dosing: '5mg'$  MWF, 2.5 mg all other days  Goal of Therapy:  INR 2-3 Monitor platelets by anticoagulation protocol: Yes   Plan:  Warfarin '5mg'$  x 1 Daily INR, monitor s/s bleeding  Bertis Ruddy, PharmD, [place] Pharmacist ED Pharmacist Phone # [phone] 04/10/2022 6:53 PM

## 2022-04-10 NOTE — ED Triage Notes (Signed)
Pt BIB [facility] from [facility].  Pt was seen last at 0700 normal in bed.  Pt was found on floor at 0730 on ground next to chair.  Hx Afib and takes Warfarin.  Pt per facility normally GCS 14.  22g right hand.

## 2022-04-11 ENCOUNTER — Inpatient Hospital Stay (HOSPITAL_COMMUNITY): Payer: Medicare Other

## 2022-04-11 ENCOUNTER — Inpatient Hospital Stay: Payer: Self-pay

## 2022-04-11 ENCOUNTER — Ambulatory Visit: Payer: Medicare Other | Admitting: Physician Assistant

## 2022-04-11 DIAGNOSIS — S2239XA Fracture of one rib, unspecified side, initial encounter for closed fracture: Secondary | ICD-10-CM

## 2022-04-11 DIAGNOSIS — N182 Chronic kidney disease, stage 2 (mild): Secondary | ICD-10-CM

## 2022-04-11 DIAGNOSIS — I5043 Acute on chronic combined systolic (congestive) and diastolic (congestive) heart failure: Secondary | ICD-10-CM

## 2022-04-11 DIAGNOSIS — I10 Essential (primary) hypertension: Secondary | ICD-10-CM

## 2022-04-11 DIAGNOSIS — I4821 Permanent atrial fibrillation: Secondary | ICD-10-CM

## 2022-04-11 HISTORY — DX: Fracture of one rib, unspecified side, initial encounter for closed fracture: S22.39XA

## 2022-04-11 LAB — BASIC METABOLIC PANEL
Anion gap: 4 — ABNORMAL LOW (ref 5–15)
BUN: 38 mg/dL — ABNORMAL HIGH (ref 8–23)
CO2: 30 mmol/L (ref 22–32)
Calcium: 8.8 mg/dL — ABNORMAL LOW (ref 8.9–10.3)
Chloride: 104 mmol/L (ref 98–111)
Creatinine, Ser: 1.24 mg/dL (ref 0.61–1.24)
GFR, Estimated: 54 mL/min — ABNORMAL LOW (ref >60)
Glucose, Bld: 111 mg/dL — ABNORMAL HIGH (ref 70–99)
Potassium: 4.7 mmol/L (ref 3.5–5.1)
Sodium: 138 mmol/L (ref 135–145)

## 2022-04-11 LAB — PROTIME-INR
INR: 2.6 — ABNORMAL HIGH (ref 0.8–1.2)
Prothrombin Time: 27.4 seconds — ABNORMAL HIGH (ref 11.4–15.2)

## 2022-04-11 MED ORDER — WARFARIN SODIUM 2.5 MG PO TABS
2.5000 mg | ORAL_TABLET | Freq: Once | ORAL | Status: AC
  Administered 2022-04-11: 2.5 mg via ORAL
  Filled 2022-04-11: qty 1

## 2022-04-11 MED ORDER — SODIUM CHLORIDE 0.9% FLUSH
10.0000 mL | Freq: Two times a day (BID) | INTRAVENOUS | Status: DC
  Administered 2022-04-11 – 2022-04-14 (×6): 10 mL

## 2022-04-11 MED ORDER — CHLORHEXIDINE GLUCONATE CLOTH 2 % EX PADS
6.0000 | MEDICATED_PAD | Freq: Every day | CUTANEOUS | Status: DC
  Administered 2022-04-12 – 2022-04-13 (×3): 6 via TOPICAL

## 2022-04-11 MED ORDER — SODIUM CHLORIDE 0.9% FLUSH: 10.0000 mL | INTRAVENOUS | Status: DC | PRN

## 2022-04-11 MED ORDER — FUROSEMIDE 10 MG/ML IJ SOLN
60.0000 mg | Freq: Two times a day (BID) | INTRAMUSCULAR | Status: DC
  Administered 2022-04-12 – 2022-04-13 (×3): 60 mg via INTRAVENOUS
  Filled 2022-04-11 (×3): qty 6

## 2022-04-11 MED ORDER — FUROSEMIDE 40 MG PO TABS
80.0000 mg | ORAL_TABLET | Freq: Once | ORAL | Status: AC
  Administered 2022-04-11: 80 mg via ORAL
  Filled 2022-04-11: qty 2

## 2022-04-11 NOTE — Assessment & Plan Note (Signed)
5th and 6th rib fractures with no pneumothorax. Continue with oral analgesics PT and OT.   Macular degeneration and ambulatory dysfunction. Patient will benefit from palliative care.

## 2022-04-11 NOTE — Progress Notes (Signed)
Pt currently has no IV access, IV team was consulted earlier today with no success in inserting a PIV, MD. Arrien was made aware and ordered a PICC line to be placed, No PICC  has been placed this afternoon, so patient is without access currently.   Alvis Lemmings, RN 04/11/2022 6:14 PM

## 2022-04-11 NOTE — Consult Note (Signed)
   [facility] CM Inpatient Consult   04/11/2022  Tyler Williams 03-Sep-1926 [id number]  [place] [facility] [ACO] Patient: Medicare ACO REACH  Primary Care Provider:  Burnard Bunting, MD, [facility]   Patient screened for 3 hospitalization  in 6 months with noted extreme high risk score for unplanned readmission risk to assess for potential [place] [facility] service needs for post hospital transition follow up needs for care coordination.  Review of patient's electronic medical record reveals patient is admitted for HF.  1240 pm Came by patient's room on rounds and was in patient care will follow up as time permits.  67 Spoke with patient via phone and he identified his name and turn the call over to his daughter, [name].  Explained to [name] reason for the call and follow up for any care coordination. Verified patient at [facility]. Will continue to follow up as time is inpatient.  Addendum - 04/12/22  1010:  Met with patient, states goes by "Tyler Williams" at the bedside to present [facility] information with an appointment reminder card and 24 hour nurse advise line to share with his daughter left on bedside table.  Patient complaining of tickle in his throat, no other concerns at this time.   Plan:  Continue to follow progress and disposition to assess for post hospital community care coordination/management needs.  Referral request for community care coordination:  Follow up for disposition  Of note, [place] does not replace or interfere with any arrangements made by the Inpatient Transition of Care team.  For questions contact:   Natividad Brood, RN BSN [place]  [phone] business mobile phone Toll free office [phone]  *[employer]  [phone] Fax number:  [phone] [name]

## 2022-04-11 NOTE — TOC Progression Note (Addendum)
Transition of Care [facility]) - Progression Note    Patient Details  Name: Ashvath Rosberg MRN: [id number] Date of Birth: Sep 08, 1926  [facility] CM/SW [place], LCSW Phone Number: 04/11/2022, 4:07 PM  Clinical Narrative:    CSW spoke with [place] at [facility] to confirm pt was from there. Zada Finders reported pt is apart of assisted living and can return with dc summary that indicates any new medication.   TOC will continue to follow this admission.         Expected Discharge Plan and Services                                               Social Determinants of Health (SDOH) Interventions SDOH Screenings   Food Insecurity: No Food Insecurity (12/23/2021)  Housing: Low Risk  (12/23/2021)  Transportation Needs: No Transportation Needs (12/23/2021)  Utilities: Not At Risk (12/23/2021)  Depression (PHQ2-9): Low Risk  (12/06/2018)  Tobacco Use: Low Risk  (01/04/2022)    Readmission Risk Interventions    12/24/2021    3:52 PM  Readmission Risk Prevention Plan  Transportation Screening Complete  PCP or Specialist Appt within 3-5 Days Complete  HRI or [place] Complete  Social Work Consult for [place] Planning/Counseling Complete  Palliative Care Screening Not Applicable  Medication Review Press [employer]) Complete   Beckey Rutter, MSW, LCSWA, LCASA Transitions of Care  Clinical Social Worker I

## 2022-04-11 NOTE — Progress Notes (Signed)
PROGRESS NOTE    Tyler Williams  [id number] DOB: [birth year] DOA: 04/10/2022 PCP: Burnard Bunting, MD  89/M w/ history of chronic atrial fibrillation, Chronic systolic and diastolic CHF, Core pulmonale, cirrhosis, and aortic stenosis sp TAVR who presented with altered mental status. About 4 days patient was advised to increase his furosemide dose due to edema. Patient had progressive weakness, on the day of admission he was found down on the floor, with confusion. In ED edema and Afib RVR. Labs -  cr 1,30, CT head and neck benign, CT chest with minimally displaced right anterior 5th and 6th ribs, ascites and positive bilateral pleural effusions.  Patient was placed on Iv furosemide for diuresis.   -2/27 lost his IV access, not able to place a peripheral line. Ordered PICC line.   Subjective:   Assessment and Plan:  Acute on chronic combined systolic and diastolic CHF -Echo with EF 45 to 50%, LV with global hypokinesis, RV with moderate to severe systolic function reduction, moderate MR,  sp TAVR,  -continue diuresis, will increase dose to 60 mg IV q12 hrs -Continue spironolactone and entresto.  Isosorbide and metoprolol.   Decompensated Cirrhosis of liver (HCC) Hepatic encephalopathy CT with cirrhotic liver with worsening ascites.  -with improved mentation, continue lactulose -continue diuresis, if persistent ascites will consider paracentesis.   Chronic kidney disease, stage 2 (mild) -stable, monitor w/ diuresis  Permanent atrial fibrillation (HCC) -continue metop and warfarin  Essential hypertension -stable, meds as above  Rib fracture 5th and 6th rib fractures with no pneumothorax. Continue with oral analgesics PT and OT.   Macular degeneration and ambulatory dysfunction. Patient will benefit from palliative care.    DVT prophylaxis: warfarin Code Status: DNR Family Communication: Disposition Plan:   Consultants:    Procedures:   Antimicrobials:     Objective: Vitals:   04/11/22 0713 04/11/22 1102 04/11/22 1210 04/11/22 1535  BP: (!) 155/70 (!) 156/64  (!) 122/56  Pulse: 71 74  60  Resp: '18 18  18  '$ Temp: 97.8 F (36.6 C) (!) 97.5 F (36.4 C)  (!) 97.3 F (36.3 C)  TempSrc: Oral Oral  Oral  SpO2: 97% 97% 97% 97%  Weight:      Height:        Intake/Output Summary (Last 24 hours) at 04/11/2022 1622 Last data filed at 04/11/2022 1537 Gross per 24 hour  Intake 620 ml  Output 2920 ml  Net -2300 ml   Filed Weights   04/10/22 0856 04/10/22 1805 04/11/22 0400  Weight: 81.6 kg 82.7 kg 82.4 kg    Examination:      Data Reviewed:   CBC: Recent Labs  Lab 04/10/22 0858 04/10/22 0906 04/10/22 1333  WBC 4.5  --   --   HGB 13.0 13.6 12.2*  HCT 39.9 40.0 36.0*  MCV 92.6  --   --   PLT 88*  --   --    Basic Metabolic Panel: Recent Labs  Lab 04/10/22 0858 04/10/22 0906 04/10/22 1333 04/11/22 0039  NA 138 140 139 138  K 4.5 4.5 4.5 4.7  CL 103 105  --  104  CO2 25  --   --  30  GLUCOSE 93 87  --  111*  BUN 43* 47*  --  38*  CREATININE 1.31* 1.30*  --  1.24  CALCIUM 9.2  --   --  8.8*   GFR: Estimated Creatinine Clearance: 38.1 mL/min (by C-G formula based on SCr of 1.24 mg/dL). Liver Function  Tests: Recent Labs  Lab 04/10/22 0858  AST 25  ALT 13  ALKPHOS 98  BILITOT 1.5*  PROT 6.8  ALBUMIN 3.2*   No results for input(s): "LIPASE", "AMYLASE" in the last 168 hours. Recent Labs  Lab 04/10/22 2110  AMMONIA 19   Coagulation Profile: Recent Labs  Lab 04/10/22 0858 04/11/22 0039  INR 2.6* 2.6*   Cardiac Enzymes: Recent Labs  Lab 04/10/22 0858  CKTOTAL 52   BNP (last 3 results) No results for input(s): "PROBNP" in the last 8760 hours. HbA1C: No results for input(s): "HGBA1C" in the last 72 hours. CBG: No results for input(s): "GLUCAP" in the last 168 hours. Lipid Profile: No results for input(s): "CHOL", "HDL", "LDLCALC", "TRIG", "CHOLHDL", "LDLDIRECT" in the last 72 hours. Thyroid  Function Tests: Recent Labs    04/10/22 1309  TSH 2.652   Anemia Panel: No results for input(s): "VITAMINB12", "FOLATE", "FERRITIN", "TIBC", "IRON", "RETICCTPCT" in the last 72 hours. Urine analysis:    Component Value Date/Time   COLORURINE YELLOW 04/10/2022 2011   APPEARANCEUR CLEAR 04/10/2022 2011   APPEARANCEUR Clear 10/05/2021 1516   LABSPEC 1.020 04/10/2022 2011   PHURINE 5.0 04/10/2022 2011   GLUCOSEU NEGATIVE 04/10/2022 2011   HGBUR LARGE (A) 04/10/2022 2011   BILIRUBINUR NEGATIVE 04/10/2022 2011   BILIRUBINUR Negative 10/05/2021 [place] 04/10/2022 2011   PROTEINUR NEGATIVE 04/10/2022 2011   UROBILINOGEN 0.2 09/27/2013 2341   NITRITE NEGATIVE 04/10/2022 2011   LEUKOCYTESUR NEGATIVE 04/10/2022 2011   Sepsis Labs: '@LABRCNTIP'$ (procalcitonin:4,lacticidven:4)  )No results found for this or any previous visit (from the past 240 hour(s)).   Radiology Studies: [place] EKG SITE RITE  Result Date: 04/11/2022 [facility] image not attached, placement could not be confirmed due to current cardiac rhythm.  CT CHEST ABDOMEN PELVIS W CONTRAST  Result Date: 04/10/2022 CLINICAL DATA:  89 year old male found down at 0730 hours today. Atrial fibrillation, on warfarin. EXAM: CT CHEST, ABDOMEN, AND PELVIS WITH CONTRAST TECHNIQUE: Multidetector CT imaging of the chest, abdomen and pelvis was performed following the standard protocol during bolus administration of intravenous contrast. RADIATION DOSE REDUCTION: This exam was performed according to the departmental dose-optimization program which includes automated exposure control, adjustment of the mA and/or kV according to patient size and/or use of iterative reconstruction technique. CONTRAST:  25m OMNIPAQUE IOHEXOL 350 MG/ML SOLN COMPARISON:  Cervical spine CT today. Prior chest CT 12/05/2019 and CT Abdomen and Pelvis 02/08/2018. FINDINGS: CT CHEST FINDINGS Cardiovascular: Chronic aortic valve replacement or TAVR.  Cardiomegaly appears mildly increased since 2019. Calcified aortic atherosclerosis. Calcified coronary artery atherosclerosis. No pericardial effusion. Thoracic aorta and other central mediastinal vascular structures appear intact. Mediastinum/Nodes: No mediastinal hematoma or lymphadenopathy identified. Lungs/Pleura: Fairly large layering bilateral pleural effusions, only trace on the 2021 chest CT. Simple fluid density favoring transudate. Superimposed mild respiratory motion. Major airways remain patent. Compressive bilateral lower lobe atelectasis. Lingula atelectasis and/or some pleural fluid tracking in the major fissure there. No pneumothorax. No convincing pulmonary contusion. Musculoskeletal: Osteopenia. Mild T4 superior endplate compression is new since the 2021 chest CT, but is visible on previous two-view chest x-ray 12/22/2021 and chronic. No retropulsion. Other thoracic vertebral height appears stable. No sternal fracture. Visible shoulder osseous structures appear intact. Right anterior minimally displaced 5th rib fracture on series 4, image 106 is new since 2021, as is a similar fracture of the anterior 6th rib on image 120. No other right rib fracture identified. No left rib fracture identified. CT ABDOMEN PELVIS FINDINGS  Hepatobiliary: Nodular and cirrhotic liver. Chronically absent gallbladder. No discrete liver mass. Perihepatic ascites has progressed since 2019. Pancreas: Negative. Spleen: No splenomegaly.  Perisplenic ascites. Adrenals/Urinary Tract: No adrenal or renal injury identified. Chronic benign renal cysts (no follow-up imaging recommended). Symmetric renal enhancement and early contrast excretion. Decompressed ureters. Mild to moderate bladder distension. Stomach/Bowel: Redundant large bowel with extensive descending and sigmoid diverticulosis. Scattered free fluid/ascites in the abdomen. No definite bowel wall thickening. No dilated small bowel. Evidence of a normal appendix on  coronal image 67. Decompressed stomach and duodenum. No free air identified. Vascular/Lymphatic: Extensive Aortoiliac calcified atherosclerosis. Status post bilateral iliac artery bypass, stable. Major arterial structures remain patent. On the delayed images the portal venous system appears to be patent. No lymphadenopathy identified. Reproductive: New ascites fluid containing left inguinal hernia since 2019. Other: Small volume simple fluid density ascites in the pelvis, similar to that in 2019. Musculoskeletal: Widespread chronic lumbar disc and endplate degeneration with degenerative lumbar scoliosis. No acute lumbar, sacral, pelvic, or proximal femur fracture identified. Partial bilateral SI joint ankylosis. Generalized abdominal and pelvic body wall edema/anasarca is new since 2019. IMPRESSION: 1. Acute minimally displaced fractures of the right anterior 5th and 6th ribs. 2. No pneumothorax, pulmonary contusion, or other acute traumatic injury identified in the chest, abdomen, or pelvis. 3. Chronic Cirrhosis and Cardiomegaly with Anasarca, increased Ascites from prior CTs, and Large layering pleural effusions with compressive atelectasis. 4.  Aortic Atherosclerosis (ICD10-I70.0). Electronically Signed   By: Genevie Ann M.D.   On: 04/10/2022 10:11   CT CERVICAL SPINE WO CONTRAST  Result Date: 04/10/2022 CLINICAL DATA:  89 year old male found down at 0730 hours today. Atrial fibrillation, on warfarin. EXAM: CT CERVICAL SPINE WITHOUT CONTRAST TECHNIQUE: Multidetector CT imaging of the cervical spine was performed without intravenous contrast. Multiplanar CT image reconstructions were also generated. RADIATION DOSE REDUCTION: This exam was performed according to the departmental dose-optimization program which includes automated exposure control, adjustment of the mA and/or kV according to patient size and/or use of iterative reconstruction technique. COMPARISON:  Head CT today.  Cervical spine CT 04/27/2021.  FINDINGS: Alignment: Stable. Straightening of cervical lordosis with chronic degenerative appearing anterolisthesis of C2 on C3 and C7 on T1. Bilateral posterior element alignment is within normal limits. Skull base and vertebrae: Mild motion artifact. Visualized skull base is intact. No atlanto-occipital dissociation. C1 and C2 appear intact and aligned. No acute osseous abnormality identified. Soft tissues and spinal canal: No prevertebral fluid or swelling. No visible canal hematoma. Severe chronic right cervical calcified atherosclerosis. Bulky contralateral left carotid calcified atherosclerosis. Stable visible noncontrast neck soft tissues. Disc levels: Stable chronic cervical spine degeneration. Up to mild suspected cervical spinal stenosis. Upper chest: Osteopenia. Visible upper thoracic levels appear intact. Moderate to large new layering pleural effusions since last year visible in the lung apices. Simple fluid density suggesting transudate. IMPRESSION: 1. No acute traumatic injury identified in the cervical spine. Stable chronic cervical spine degeneration. 2. Moderate to large layering pleural effusions visible in the lung apices and new from cervical spine CT last year. 3. Chronic severe calcified carotid atherosclerosis worse on the right. Electronically Signed   By: Genevie Ann M.D.   On: 04/10/2022 09:56   CT HEAD WO CONTRAST  Result Date: 04/10/2022 CLINICAL DATA:  89 year old male found down at 0730 hours today. Atrial fibrillation, on warfarin. EXAM: CT HEAD WITHOUT CONTRAST TECHNIQUE: Contiguous axial images were obtained from the base of the skull through the vertex without intravenous contrast. RADIATION  DOSE REDUCTION: This exam was performed according to the departmental dose-optimization program which includes automated exposure control, adjustment of the mA and/or kV according to patient size and/or use of iterative reconstruction technique. COMPARISON:  Head CT 04/27/2021. FINDINGS: Brain:  Stable cerebral volume since last year. No midline shift, ventriculomegaly, mass effect, evidence of mass lesion, intracranial hemorrhage or evidence of cortically based acute infarction. Stable small chronic appearing cerebellar infarcts. Patchy mild to moderate for age cerebral white matter hypodensity does not appear significantly changed. Vascular: Calcified atherosclerosis at the skull base. No suspicious intracranial vascular hyperdensity. Skull: Motion artifact at the skull base. No acute osseous abnormality identified. Sinuses/Orbits: Visualized paranasal sinuses and mastoids are stable and well aerated. Other: Calcified scalp vessel atherosclerosis. No orbit or scalp soft tissue injury identified. IMPRESSION: 1. Mild motion artifact. No acute intracranial abnormality or acute traumatic injury identified. 2. Stable non contrast CT appearance of chronic small vessel disease. Electronically Signed   By: Genevie Ann M.D.   On: 04/10/2022 09:53     Scheduled Meds:  atorvastatin  20 mg Oral Daily   fesoterodine  8 mg Oral Daily   finasteride  5 mg Oral Daily   furosemide  60 mg Intravenous BID   isosorbide mononitrate  60 mg Oral Daily   lactulose  30 g Oral BID   lidocaine  1 patch Transdermal Q24H   metoprolol succinate  12.5 mg Oral Daily   polyethylene glycol  17 g Oral Daily   sacubitril-valsartan  1 tablet Oral BID   sodium chloride flush  3 mL Intravenous Q12H   spironolactone  25 mg Oral Daily   tamsulosin  0.4 mg Oral Daily   Warfarin - Pharmacist Dosing Inpatient   Does not apply q1600   Continuous Infusions:  sodium chloride       LOS: 1 day    Time spent: 31mn    PDomenic Polite MD [facility]   04/11/2022, 4:22 PM

## 2022-04-11 NOTE — Progress Notes (Signed)
Peripherally Inserted Central Catheter Placement  The IV Nurse has discussed with the patient and/or persons authorized to consent for the patient, the purpose of this procedure and the potential benefits and risks involved with this procedure.  The benefits include less needle sticks, lab draws from the catheter, and the patient may be discharged home with the catheter. Risks include, but not limited to, infection, bleeding, blood clot (thrombus formation), and puncture of an artery; nerve damage and irregular heartbeat and possibility to perform a PICC exchange if needed/ordered by physician.  Alternatives to this procedure were also discussed.  Bard Power PICC patient education guide, fact sheet on infection prevention and patient information card has been provided to patient /or left at bedside.    PICC Placement Documentation  PICC Double Lumen [id number] Right Basilic 38 cm 0 cm (Active)  Indication for Insertion or Continuance of Line Poor Vasculature-patient has had multiple peripheral attempts or PIVs lasting less than 24 hours 04/11/22 1822  Exposed Catheter (cm) 0 cm 04/11/22 1822  Site Assessment Clean, Dry, Intact 04/11/22 1822  Lumen #1 Status Flushed;Saline locked;Blood return noted 04/11/22 1822  Lumen #2 Status Flushed;Saline locked;Blood return noted 04/11/22 1822  Dressing Type Transparent;Securing device 04/11/22 1822  Dressing Status Antimicrobial disc in place;Clean, Dry, Intact 04/11/22 1822  Safety Lock Not Applicable [id number]  Line Care Connections checked and tightened 04/11/22 1822  Line Adjustment (NICU/IV Team Only) No 04/11/22 1822  Dressing Intervention New dressing;Other (Comment) 04/11/22 1822  Dressing Change Due 04/18/22 04/11/22 1822    Patient's son, Tyler Williams, signed PICC consent. Verified by 2 PICC RNs. Bilateral arms with extreme bruising prior to insertion.   Enos Fling 04/11/2022, 6:24 PM

## 2022-04-11 NOTE — Assessment & Plan Note (Signed)
CT with cirrhotic liver with worsening ascites.  Portosystemic hepatic encephalopathy.   Patient with improved mentation,  No asterixis.  Will continue diuresis, if persistent ascites will consider paracentesis.  No clinical signs or peritonitis.

## 2022-04-11 NOTE — Progress Notes (Signed)
ANTICOAGULATION CONSULT NOTE - Follow Up Consult  Pharmacy Consult for Warfarin Indication: atrial fibrillation  No Known Allergies  Patient Measurements: Height: '5\' 8"'$  (172.7 cm) Weight: 82.4 kg (181 lb 10.5 oz) IBW/kg (Calculated) : 68.4  Vital Signs: Temp: 97.5 F (36.4 C) (02/27 1102) Temp Source: Oral (02/27 1102) BP: 156/64 (02/27 1102) Pulse Rate: 74 (02/27 1102)  Labs: Recent Labs    04/10/22 0858 04/10/22 0906 04/10/22 1333 04/11/22 0039  HGB 13.0 13.6 12.2*  --   HCT 39.9 40.0 36.0*  --   PLT 88*  --   --   --   LABPROT 27.7*  --   --  27.4*  INR 2.6*  --   --  2.6*  CREATININE 1.31* 1.30*  --  1.24  CKTOTAL 52  --   --   --     Estimated Creatinine Clearance: 38.1 mL/min (by C-G formula based on SCr of 1.24 mg/dL).  Assessment: 51 YOM presenting from facility found down, hx of afib on warfarin PTA, INR on admission is therapeutic at 2.6, last dose administered at facility on 2/25.  INR remains therapeutic (2.6) after usual warfarin dose of 5 mg given on 2/26. Platelet count low and has been low in the past. No bleeding reported.  PTA warfarin regimen: 5 mg MWF, 2.5 mg TTSS  Goal of Therapy:  INR 2-3 Monitor platelets by anticoagulation protocol: Yes   Plan:  Warfarin 2.5 mg x 1 today, usual Tuesday dose. Daily PT/INR for now. CBC in am. Monitor low platelet count. Monitor for any sign/symptoms of bleeding.   Arty Baumgartner, RPh 04/11/2022,12:51 PM

## 2022-04-11 NOTE — Assessment & Plan Note (Signed)
Continue rate control with metoprolol and anticoagulation with warfarin. Continue telemetry monitoring.

## 2022-04-11 NOTE — Hospital Course (Addendum)
Mr. Halder was admitted to the hospital with the working diagnosis of acute metabolic encephalopathy.   89 yo male with the past medical history of chronic atrial fibrillation, heart failure, core pulmonale, cirrhosis, and aortic stenosis sp TAVR who presented with altered mental status. Information obtained from his son over the phone, patient not able to give detail history. About 4 days patient was advised to increase his furosemide dose due to edema. Patient had progressive weakness, on the day of admission he was found down on the floor, with positive confusion, prompting his transfer to the ED. On his initial physical examination his blood pressure was 152/67, HR 48, RR 15 and 02 saturation 98%, lungs with rales and increased work of breathing, heart with S1 and S2 present, irregularly irregular with no gallops or rubs, abdominal wall with edema but no distention and positive severe lower extremity edema.   Na 140, K 4,5 CL 103, bicarbonate 25, glucose 93 bun 47 cr 1,30 Wbc 4.5 hgb 13.0 plt 88   CT head and neck with no acute changes.  CT chest with minimally displaced right anterior 5th and 6th ribs No pneumothorax.  Positive ascites and positive bilateral pleural effusions.   Patient was placed on Iv furosemide for diuresis.   02/27 lost his IV access, not able to place a peripheral line. Ordered PICC line.

## 2022-04-11 NOTE — Progress Notes (Signed)
Mobility Specialist Progress Note:   04/11/22 1500  Mobility  Activity Ambulated with assistance in hallway  Level of Assistance Contact guard assist, steadying assist  Assistive Device Front wheel walker  Distance Ambulated (ft) 150 ft  Activity Response Tolerated well  Mobility Referral Yes  $Mobility charge 1 Mobility   Pt eager for mobility session. Required minG assist during ambulation. No physical assistance required. Pt asx throughout, back in bed with all needs met.   Nelta Numbers Mobility Specialist Please contact via SecureChat or  Rehab office at [phone]

## 2022-04-11 NOTE — Progress Notes (Addendum)
Progress Note   Patient: Tyler Williams [id number] DOB: Jan 12, 1927 DOA: 04/10/2022     1 DOS: the patient was seen and examined on 04/11/2022   Brief hospital course: Mr. Athey was admitted to the hospital with the working diagnosis of acute metabolic encephalopathy.   89 yo male with the past medical history of chronic atrial fibrillation, heart failure, core pulmonale, cirrhosis, and aortic stenosis sp TAVR who presented with altered mental status. Information obtained from his son over the phone, patient not able to give detail history. About 4 days patient was advised to increase his furosemide dose due to edema. Patient had progressive weakness, on the day of admission he was found down on the floor, with positive confusion, prompting his transfer to the ED. On his initial physical examination his blood pressure was 152/67, HR 48, RR 15 and 02 saturation 98%, lungs with rales and increased work of breathing, heart with S1 and S2 present, irregularly irregular with no gallops or rubs, abdominal wall with edema but no distention and positive severe lower extremity edema.   Na 140, K 4,5 CL 103, bicarbonate 25, glucose 93 bun 47 cr 1,30 Wbc 4.5 hgb 13.0 plt 88   CT head and neck with no acute changes.  CT chest with minimally displaced right anterior 5th and 6th ribs No pneumothorax.  Positive ascites and positive bilateral pleural effusions.   Patient was placed on Iv furosemide for diuresis.   02/27 lost his IV access, not able to place a peripheral line. Ordered PICC line.   Assessment and Plan: * Acute on chronic combined systolic and diastolic CHF (congestive heart failure) (HCC) Echocardiogram with mildly reduced LV systolic function with EF 45 to 50%, LV with global hypokinesis, RV with moderate to severe systolic function reduction, RV cavity with mild enlargement, RVSP 52.2  LA and RA with sever dilatation, moderate MR, mild to moderate TR, sp TAVR, trivial pericardial  effusion.   Urine output is [id number] Systolic blood pressure is 156 to 106 mmHg,   Plan to continue diuresis, will increase dose to 60 mg IV q12 hrs Continue with spironolactone and entresto.  Isosorbide and metoprolol.   Cirrhosis of liver (HCC) CT with cirrhotic liver with worsening ascites.  Portosystemic hepatic encephalopathy.   Patient with improved mentation,  No asterixis.  Will continue diuresis, if persistent ascites will consider paracentesis.  No clinical signs or peritonitis.      Chronic kidney disease, stage 2 (mild) Renal function with serum cr at 1.24 with K at 4,7 and serum bicarbonate at 30. Plan to continue diuresis and follow up renal function in am.   Permanent atrial fibrillation (HCC) Continue rate control with metoprolol and anticoagulation with warfarin. Continue telemetry monitoring.   Essential hypertension Continue blood pressure control with entresto and spironolactone.  Diuresis with furosemide Continue with metoprolol.   Rib fracture 5th and 6th rib fractures with no pneumothorax. Continue with oral analgesics PT and OT.   Macular degeneration and ambulatory dysfunction. Patient will benefit from palliative care.        Subjective: Patient is feeling better, but not yet back to his baseline, his edema is improving,   Physical Exam: Vitals:   04/11/22 0713 04/11/22 1102 04/11/22 1210 04/11/22 1535  BP: (!) 155/70 (!) 156/64  (!) 122/56  Pulse: 71 74  60  Resp: '18 18  18  '$ Temp: 97.8 F (36.6 C) (!) 97.5 F (36.4 C)  (!) 97.3 F (36.3 C)  TempSrc: Oral Oral  Oral  SpO2: 97% 97% 97% 97%  Weight:      Height:       Neurology awake and alert, no asterixis  ENT with mild pallor Cardiovascular with S1 and S2 present, irregular with no gallops, positive systolic murmur at the apex, with no gallops.  Respiratory with mild rales at bases with no wheezing, no rhonchi Abdomen with mild distention and dullness to percussion at the  dependent zones with no tenderness or rebound Positive lower extremity edema ++ pitting  Data Reviewed:    Family Communication: no family at the bedside. I spoke with patient's son over the phone, we talked in detail about patient's condition, plan of care and prognosis and all questions were addressed.     Disposition: Status is: Inpatient Remains inpatient appropriate because: volume overload.   Planned Discharge Destination: Home      Author: Tawni Millers, MD 04/11/2022 3:42 PM  For on call review [email or web address].

## 2022-04-11 NOTE — Assessment & Plan Note (Signed)
Renal function with serum cr at 1.24 with K at 4,7 and serum bicarbonate at 30. Plan to continue diuresis and follow up renal function in am.

## 2022-04-11 NOTE — Evaluation (Signed)
Physical Therapy Evaluation Patient Details Name: Tyler Williams MRN: [id number] DOB: [birth year] Today's Date: 04/11/2022  History of Present Illness  Tyler Williams is a 89 y.o. male with medical history significant of chronic A-fib on Coumadin, chronic HFrEF LVEF 40-45%, chronic right-sided heart failure with cor pulmonale, cirrhosis, CAD, aortic stenosis status post TAVR, macular degeneration with poor vision, GERD, BPH, was found on the floor confused  Clinical Impression  Pt tolerated session well today. Pt was able to ambulate in hallway today CGA with RW. Pt and daughter state that pt mobility today was close to his baseline. Recommend that pt returns to SNF and continue PT at that facility. Pt would benefit from continued OOB mobility during acute stay.     Recommendations for follow up therapy are one component of a multi-disciplinary discharge planning process, led by the attending physician.  Recommendations may be updated based on patient status, additional functional criteria and insurance authorization.  Follow Up Recommendations Other (comment) (Return to SNF and continue PT there)      Assistance Recommended at Discharge Intermittent Supervision/Assistance  Patient can return home with the following  A little help with walking and/or transfers;A little help with bathing/dressing/bathroom;Assistance with cooking/housework;Assistance with feeding;Help with stairs or ramp for entrance    Equipment Recommendations Rolling walker (2 wheels)  Recommendations for Other Services       Functional Status Assessment Patient has had a recent decline in their functional status and demonstrates the ability to make significant improvements in function in a reasonable and predictable amount of time.     Precautions / Restrictions Precautions Precautions: Fall;Other (comment) (R Rib fracture) Restrictions Weight Bearing Restrictions: No      Mobility  Bed  Mobility Overal bed mobility: Needs Assistance Bed Mobility: Supine to Sit, Sit to Supine     Supine to sit: Supervision Sit to supine: Min guard        Transfers Overall transfer level: Needs assistance Equipment used: Rolling walker (2 wheels) Transfers: Sit to/from Stand Sit to Stand: Min guard           General transfer comment: 1 LOB when first staanding however able to self correct.    Ambulation/Gait Ambulation/Gait assistance: Min guard Gait Distance (Feet): 150 Feet Assistive device: Rolling walker (2 wheels) Gait Pattern/deviations: WFL(Within Functional Limits) Gait velocity: decreased        Stairs            Wheelchair Mobility    Modified Rankin (Stroke Patients Only)       Balance Overall balance assessment: Mild deficits observed, not formally tested                                           Pertinent Vitals/Pain Pain Assessment Pain Assessment: No/denies pain    Home Living Family/patient expects to be discharged to:: Skilled nursing facility                   Additional Comments: lives in accessible environment with rollator to walk    Prior Function Prior Level of Function : Needs assist       Physical Assist : Mobility (physical) Mobility (physical): Gait   Mobility Comments: Pt states he uses rollator primarily. Per pt daughter mobility has been fluctuating. ADLs Comments: has staff assist for bathing and dressing     Hand Dominance   Dominant Hand:  Right    Extremity/Trunk Assessment   Upper Extremity Assessment Upper Extremity Assessment: Overall WFL for tasks assessed    Lower Extremity Assessment Lower Extremity Assessment: Overall WFL for tasks assessed       Communication   Communication: No difficulties (Mild HOH)  Cognition Arousal/Alertness: Awake/alert Behavior During Therapy: WFL for tasks assessed/performed Overall Cognitive Status: History of cognitive impairments -  at baseline                                 General Comments: Daughter available to answer questions        General Comments General comments (skin integrity, edema, etc.): VSS during ambulation. Daughter present durign session and able to assist with history    Exercises     Assessment/Plan    PT Assessment Patient needs continued PT services  PT Problem List Decreased strength;Decreased activity tolerance;Decreased mobility;Decreased knowledge of use of DME;Decreased coordination       PT Treatment Interventions DME instruction;Gait training;Functional mobility training;Therapeutic activities;Therapeutic exercise;Balance training;Neuromuscular re-education;Patient/family education    PT Goals (Current goals can be found in the Care Plan section)  Acute Rehab PT Goals Patient Stated Goal: to get better PT Goal Formulation: With patient Time For Goal Achievement: 04/25/22 Potential to Achieve Goals: Good    Frequency Min 3X/week     Co-evaluation               AM-PAC PT "6 Clicks" Mobility  Outcome Measure Help needed turning from your back to your side while in a flat bed without using bedrails?: None Help needed moving from lying on your back to sitting on the side of a flat bed without using bedrails?: None Help needed moving to and from a bed to a chair (including a wheelchair)?: A Little Help needed standing up from a chair using your arms (e.g., wheelchair or bedside chair)?: A Little Help needed to walk in hospital room?: A Little Help needed climbing 3-5 steps with a railing? : A Lot 6 Click Score: 19    End of Session Equipment Utilized During Treatment: Gait belt Activity Tolerance: Patient tolerated treatment well Patient left: in bed;with call bell/phone within reach;with bed alarm set;with family/visitor present Nurse Communication: Mobility status PT Visit Diagnosis: Other abnormalities of gait and mobility (R26.89);Unsteadiness on  feet (R26.81)    Time: 1028-1100 PT Time Calculation (min) (ACUTE ONLY): 32 min   Charges:   PT Evaluation $PT Eval Moderate Complexity: 1 Mod PT Treatments $Gait Training: 8-22 mins        Shelby Mattocks, PT, DPT Acute Rehab Services [id number]   Viann Shove 04/11/2022, 2:11 PM

## 2022-04-11 NOTE — Assessment & Plan Note (Signed)
Echocardiogram with mildly reduced LV systolic function with EF 45 to 50%, LV with global hypokinesis, RV with moderate to severe systolic function reduction, RV cavity with mild enlargement, RVSP 52.2  LA and RA with sever dilatation, moderate MR, mild to moderate TR, sp TAVR, trivial pericardial effusion.   Urine output is [id number] ml Systolic blood pressure is 156 to 106 mmHg,   Plan to continue diuresis, will increase dose to 60 mg IV q12 hrs Continue with spironolactone and entresto.  Isosorbide and metoprolol.

## 2022-04-11 NOTE — Assessment & Plan Note (Signed)
Continue blood pressure control with entresto and spironolactone.  Diuresis with furosemide Continue with metoprolol.

## 2022-04-11 NOTE — IPAL (Signed)
I spoke with Mr. Morataya son, Mr. Quashaun Klimczak, I explained him patient's current condition of volume overload related to heart failure and liver failure. As outpatient apparently his dose of diuretic has been regulated, but he develops either hypervolemia or hypovolemia. He has been in the hospital several times over last 6 months.  His overall prognosis, considering his age and advance medical problems is poor. He agrees in getting information for possible hospice services at the time of patient's discharge from the hospital.

## 2022-04-12 DIAGNOSIS — Z7189 Other specified counseling: Secondary | ICD-10-CM

## 2022-04-12 DIAGNOSIS — I4821 Permanent atrial fibrillation: Secondary | ICD-10-CM | POA: Diagnosis not present

## 2022-04-12 DIAGNOSIS — S2241XA Multiple fractures of ribs, right side, initial encounter for closed fracture: Secondary | ICD-10-CM | POA: Diagnosis not present

## 2022-04-12 DIAGNOSIS — I5043 Acute on chronic combined systolic (congestive) and diastolic (congestive) heart failure: Secondary | ICD-10-CM | POA: Diagnosis not present

## 2022-04-12 DIAGNOSIS — Z515 Encounter for palliative care: Secondary | ICD-10-CM

## 2022-04-12 LAB — CBC
HCT: 34.8 % — ABNORMAL LOW (ref 39.0–52.0)
Hemoglobin: 11.6 g/dL — ABNORMAL LOW (ref 13.0–17.0)
MCH: 30.2 pg (ref 26.0–34.0)
MCHC: 33.3 g/dL (ref 30.0–36.0)
MCV: 90.6 fL (ref 80.0–100.0)
Platelets: 87 10*3/uL — ABNORMAL LOW (ref 150–400)
RBC: 3.84 MIL/uL — ABNORMAL LOW (ref 4.22–5.81)
RDW: 16.4 % — ABNORMAL HIGH (ref 11.5–15.5)
WBC: 4.9 10*3/uL (ref 4.0–10.5)
nRBC: 0 % (ref 0.0–0.2)

## 2022-04-12 LAB — BASIC METABOLIC PANEL
Anion gap: 9 (ref 5–15)
BUN: 32 mg/dL — ABNORMAL HIGH (ref 8–23)
CO2: 25 mmol/L (ref 22–32)
Calcium: 8.7 mg/dL — ABNORMAL LOW (ref 8.9–10.3)
Chloride: 105 mmol/L (ref 98–111)
Creatinine, Ser: 1 mg/dL (ref 0.61–1.24)
GFR, Estimated: >60 mL/min (ref >60)
Glucose, Bld: 87 mg/dL (ref 70–99)
Potassium: 4 mmol/L (ref 3.5–5.1)
Sodium: 139 mmol/L (ref 135–145)

## 2022-04-12 LAB — PROTIME-INR
INR: 3 — ABNORMAL HIGH (ref 0.8–1.2)
Prothrombin Time: 31 seconds — ABNORMAL HIGH (ref 11.4–15.2)

## 2022-04-12 MED ORDER — MENTHOL 3 MG MT LOZG
1.0000 | LOZENGE | OROMUCOSAL | Status: DC | PRN
  Administered 2022-04-12: 3 mg via ORAL
  Filled 2022-04-12 (×2): qty 9

## 2022-04-12 MED ORDER — WARFARIN SODIUM 2.5 MG PO TABS
2.5000 mg | ORAL_TABLET | Freq: Once | ORAL | Status: AC
  Administered 2022-04-12: 2.5 mg via ORAL
  Filled 2022-04-12: qty 1

## 2022-04-12 NOTE — Plan of Care (Signed)
  Problem: Clinical Measurements: Goal: Will remain free from infection Outcome: Progressing Goal: Diagnostic test results will improve Outcome: Progressing   

## 2022-04-12 NOTE — Progress Notes (Signed)
ANTICOAGULATION CONSULT NOTE - Follow Up Consult  Pharmacy Consult for Warfarin Indication: atrial fibrillation  No Known Allergies  Patient Measurements: Height: '5\' 8"'$  (172.7 cm) Weight: 81.4 kg (179 lb 7.3 oz) IBW/kg (Calculated) : 68.4  Vital Signs: Temp: 97.6 F (36.4 C) (02/28 0711) Temp Source: Oral (02/28 0711) BP: 132/64 (02/28 0711) Pulse Rate: 62 (02/28 0711)  Labs: Recent Labs    04/10/22 0858 04/10/22 0906 04/10/22 1333 04/11/22 0039 04/12/22 0522  HGB 13.0 13.6 12.2*  --  11.6*  HCT 39.9 40.0 36.0*  --  34.8*  PLT 88*  --   --   --  87*  LABPROT 27.7*  --   --  27.4* 31.0*  INR 2.6*  --   --  2.6* 3.0*  CREATININE 1.31* 1.30*  --  1.24 1.00  CKTOTAL 52  --   --   --   --      Estimated Creatinine Clearance: 43.7 mL/min (by C-G formula based on SCr of 1 mg/dL).  Assessment: 38 YOM presenting from facility found down, hx of afib on warfarin PTA, INR on admission is therapeutic at 2.6, last dose administered at facility on 2/25 PTA.  INR remains therapeutic but at top of range at 3.0 with home warfarin dose resumed. Hg low stable. Platelet count low but at 87 but stable from previous (hx TCP). No bleed issues reported. No DDI noted.  PTA warfarin regimen: 5 mg MWF, 2.5 mg TTSS (last dose change 02/09/22 per outpatient notes)  Goal of Therapy:  INR 2-3 Monitor platelets by anticoagulation protocol: Yes   Plan:  Warfarin 2.5 mg x 1 (lower than PTA dose today with INR trending toward top of therapeutic range, may need to hold if trends up further) Monitor daily INR, CBC, s/sx bleeding   Arturo Morton, PharmD, BCPS Please check AMION for all [facility] contact numbers Clinical Pharmacist 04/12/2022 8:38 AM

## 2022-04-12 NOTE — TOC CAGE-AID Note (Signed)
Transition of Care [facility]) - CAGE-AID Screening   Patient Details  Name: Tyler Williams MRN: [id number] Date of Birth: 1926-09-01   Dia Crawford, RN Phone Number: 04/12/2022, 5:01 AM   Clinical Narrative:  Pt denies tobacco, etoh, drug use. No resources needed.   CAGE-AID Screening:    Have You Ever Felt You Ought to Cut Down on Your Drinking or Drug Use?: No Have People Annoyed You By Critizing Your Drinking Or Drug Use?: No Have You Felt Bad Or Guilty About Your Drinking Or Drug Use?: No Have You Ever Had a Drink or Used Drugs First Thing In The Morning to Steady Your Nerves or to Get Rid of a Hangover?: No CAGE-AID Score: 0

## 2022-04-12 NOTE — TOC Progression Note (Addendum)
Transition of Care [facility]) - Progression Note    Patient Details  Name: Tyler Williams MRN: [id number] Date of Birth: [birth year]  Transition of Care [facility]) CM/SW Contact  Zenon Mayo, RN Phone Number: 04/12/2022, 12:04 PM  Clinical Narrative:    Patient will need  HHPT, HHOT at [facility] onsite with [place] phone is [phone] and fax number is [phone], will fax orders when MD put orders in. HHPT, [place] orders faxed to [facility].         Expected Discharge Plan and Services                                               Social Determinants of Health (SDOH) Interventions SDOH Screenings   Food Insecurity: No Food Insecurity (12/23/2021)  Housing: Low Risk  (12/23/2021)  Transportation Needs: No Transportation Needs (12/23/2021)  Utilities: Not At Risk (12/23/2021)  Depression (PHQ2-9): Low Risk  (12/06/2018)  Tobacco Use: Low Risk  (01/04/2022)    Readmission Risk Interventions    12/24/2021    3:52 PM  Readmission Risk Prevention Plan  Transportation Screening Complete  PCP or Specialist Appt within 3-5 Days Complete  HRI or [place] Complete  Social Work Consult for [place] Planning/Counseling Complete  Palliative Care Screening Not Applicable  Medication Review Press photographer) Complete

## 2022-04-12 NOTE — Consult Note (Signed)
Palliative Care Consult Note                                  Date: 04/12/2022   Patient Name: Tyler Williams  DOB: 1926-10-27  MRN: [id number]  Age / Sex: 89 y.o., male  PCP: Burnard Bunting, MD Referring Physician: Domenic Polite, MD  Reason for Consultation: Establishing goals of care  HPI/Patient Profile: 89 y.o. male  with past medical history of chronic atrial fibrillation, chronic systolic and diastolic CHF, cor pulmonale, cirrhosis, and aortic stenosis status post TAVR presented to [facility] ED on 04/10/2022 with altered mental status.  He was found down on the floor at his ALF.  In the ED, he was found to be in A-fib with RVR and have edema and crackles on exam.  Imaging showed minimally displaced right anterior fifth and sixth rib fractures, ascites, and bilateral pleural effusions.  Patient was admitted to [facility] with acute on chronic combined systolic and diastolic CHF.  Palliative Medicine was consulted for goals of care.    Subjective:   I have reviewed medical records including progress notes, labs and imaging, and assessed the patient at bedside. He is awake, and tells me he feels "wonderful". He knows he came to the hospital after having a fall.   I introduced Palliative Medicine as specialized medical care for people living with serious illness. It focuses on providing relief from the symptoms and stress of a serious illness.   A brief life review was discussed. 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 He has 3 children - son/Tyler Williams and daughters Tyler Williams and Tyler Williams. He was married to his wife for 15 years before she died 3 years ago. He shares that she stopped eating and that she was under hospice care when she died.   Tyler Williams states that his goal of care is to return to [facility]. He reports that he enjoys living there, but often gets "bored". He hopes to remain as  active as possible.   Tyler Williams reports that he does have advanced directive documents, and that his daughter Tyler Williams is his designated Economist.   ______________________________________________________________________  I spoke with patient's daughter/Tyler Williams by phone to discuss diagnosis, prognosis, and GOC.  We discussed patient's current illness and what it means in the larger context of his ongoing co-morbidities. Reviewed his chronic medical conditions including a-fib and heart failure. Current clinical status was reviewed. Natural disease trajectory of heart failure was discussed, emphasizing it is progressive and non-curable.  Hospice and outpatient palliative care services were explained and offered. Discussion on the difference between Palliative and Hospice care was had per family request. Provided education that palliative care may be offered during any phase of a serious illness, while hospice care is usually offered when a person is expected to live for 6 months or less.   Values and goals of care important to patient and family were attempted to be elicited. Tyler Williams verbalizes understanding that her father's time may be limited - she states "he could go at any time", or it could be 6 months or 12 months. However, she reports that her father enjoys going to exercise classes at his ALF and seems to have improved quality of life when he can be more physically active. She feels he will benefit from continued PT services. Discussed that hospice would therefore not be appropriate at this  time because it does not allow for PT services.  She is agreeable to a referral to outpatient palliative, which can provide ongoing support with option to transition to hospice in the future.      Review of Systems  All other systems reviewed and are negative.   Objective:   Primary Diagnoses: Present on Admission:  Acute on chronic combined systolic and diastolic CHF (congestive heart failure) (HCC)   Cirrhosis of liver (HCC)  Chronic kidney disease, stage 2 (mild)  Essential hypertension  Permanent atrial fibrillation [facility])   Physical Exam Vitals reviewed.  Constitutional:      General: He is not in acute distress.    Comments: Chronically ill-appearing  Pulmonary:     Effort: Pulmonary effort is normal.  Neurological:     Mental Status: He is alert and oriented to person, place, and time.     Vital Signs:  BP 131/60 (BP Location: Right Arm)   Pulse 62   Temp (!) 97.4 F (36.3 C) (Oral)   Resp 16   Ht '5\' 8"'$  (1.727 m)   Wt 81.4 kg   SpO2 93%   BMI 27.29 kg/m   Palliative Assessment/Data: PPS 50%     Assessment & Plan:   SUMMARY OF RECOMMENDATIONS   Continue current supportive interventions Goal of care - medical stabilization, return to ALF, continue PT services I have asked daughter to bring a copy of patient's advanced directives  Outpatient palliative referral   Primary Decision Maker: PATIENT Daughter/Tyler Williams is documented HCPOA  Code Status/Advance Care Planning: DNR  Prognosis:  Unable to determine    Thank you for allowing [place] to participate in the care of Tyler Williams   Signed by: Elie Confer, NP Palliative Medicine Team  Team Phone # [phone]  For individual providers, please see AMION

## 2022-04-12 NOTE — Evaluation (Signed)
Occupational Therapy Evaluation Patient Details Name: Tyler Williams MRN: [id number] DOB: Dec 03, 1926 Today's Date: 04/12/2022   History of Present Illness Tyler Williams is a 89 y.o. male with medical history significant of chronic A-fib on Coumadin, chronic HFrEF LVEF 40-45%, chronic right-sided heart failure with cor pulmonale, cirrhosis, CAD, aortic stenosis status post TAVR, macular degeneration with poor vision, GERD, BPH, was found on the floor confused   Clinical Impression   Pt from [facility], needs assistance at baseline for ADLs and uses rollator for mobility. Pt currently needing min-max A for ADLs, min guard for bed mobility,and min guard for transfers with RW. VSS on RA throughout session. Pt presenting with impairments listed below, will follow acutely. Recommend HHOT at ALF at d/c.      Recommendations for follow up therapy are one component of a multi-disciplinary discharge planning process, led by the attending physician.  Recommendations may be updated based on patient status, additional functional criteria and insurance authorization.   Follow Up Recommendations  Home health OT (at ALF)     Assistance Recommended at Discharge Frequent or constant Supervision/Assistance  Patient can return home with the following Direct supervision/assist for medications management;Direct supervision/assist for financial management;Assist for transportation;Help with stairs or ramp for entrance;Assistance with cooking/housework;A lot of help with bathing/dressing/bathroom;A little help with walking and/or transfers    Functional Status Assessment  Patient has had a recent decline in their functional status and demonstrates the ability to make significant improvements in function in a reasonable and predictable amount of time.  Equipment Recommendations  None recommended by OT    Recommendations for Other Services PT consult     Precautions / Restrictions  Precautions Precautions: Fall;Other (comment) Precaution Comments: R rib fx Restrictions Weight Bearing Restrictions: No      Mobility Bed Mobility Overal bed mobility: Needs Assistance Bed Mobility: Supine to Sit, Sit to Supine     Supine to sit: Min guard Sit to supine: Min guard        Transfers Overall transfer level: Needs assistance Equipment used: Rolling walker (2 wheels) Transfers: Sit to/from Stand Sit to Stand: Min guard                  Balance Overall balance assessment: Needs assistance Sitting-balance support: Feet supported Sitting balance-Leahy Scale: Good Sitting balance - Comments: sits unsupported at EOB   Standing balance support: During functional activity, Reliant on assistive device for balance Standing balance-Leahy Scale: Poor Standing balance comment: reliant on external support                           ADL either performed or assessed with clinical judgement   ADL Overall ADL's : Needs assistance/impaired Eating/Feeding: Set up   Grooming: Minimal assistance   Upper Body Bathing: Moderate assistance   Lower Body Bathing: Maximal assistance   Upper Body Dressing : Moderate assistance   Lower Body Dressing: Maximal assistance   Toilet Transfer: Min guard;Ambulation;Rolling walker (2 wheels);BSC/3in1   Toileting- Clothing Manipulation and Hygiene: Maximal assistance       Functional mobility during ADLs: Min guard;Minimal assistance       Vision Baseline Vision/History: 1 Wears glasses Additional Comments: reports wears glasses at baseline;states he left them at home, functional for BADL but unable to read clock across the room     Perception Perception Perception Tested?: No   Praxis Praxis Praxis tested?: Not tested    Pertinent Vitals/Pain Pain Assessment Pain  Assessment: No/denies pain     Hand Dominance Right   Extremity/Trunk Assessment Upper Extremity Assessment Upper Extremity  Assessment: Overall WFL for tasks assessed (discoloration noted on L >R UE's, strength and sensation WFL)   Lower Extremity Assessment Lower Extremity Assessment: Defer to PT evaluation   Cervical / Trunk Assessment Cervical / Trunk Assessment: Normal   Communication Communication Communication: No difficulties   Cognition Arousal/Alertness: Awake/alert Behavior During Therapy: WFL for tasks assessed/performed, Anxious Overall Cognitive Status: History of cognitive impairments - at baseline                                 General Comments: pt A & o x4, following commands appropriately and able to elaborate on PLOF     General Comments  VSS on RA    Exercises     Shoulder Instructions      Home Living Family/patient expects to be discharged to:: Assisted living ([facility])                             Home Equipment: Rollator (4 wheels);Shower seat - built in          Prior Functioning/Environment Prior Level of Function : Needs assist             Mobility Comments: use of rollator ADLs Comments: staff assist for ADLs, meals, medication mgmt        OT Problem List: Decreased strength;Decreased range of motion;Decreased activity tolerance;Impaired balance (sitting and/or standing);Decreased safety awareness;Decreased cognition      OT Treatment/Interventions: Self-care/ADL training;Therapeutic exercise;DME and/or AE instruction;Therapeutic activities;Patient/family education;Balance training    OT Goals(Current goals can be found in the care plan section) Acute Rehab OT Goals Patient Stated Goal: none stated OT Goal Formulation: With patient Time For Goal Achievement: 04/26/22 Potential to Achieve Goals: Good ADL Goals Pt Will Perform Upper Body Dressing: with supervision;sitting Pt Will Perform Lower Body Dressing: with min guard assist;sitting/lateral leans;sit to/from stand Pt Will Transfer to Toilet: with min guard  assist;ambulating;regular height toilet Pt Will Perform Tub/Shower Transfer: Tub transfer;Shower transfer;with min guard assist;ambulating;shower seat  OT Frequency: Min 2X/week    Co-evaluation              AM-PAC OT "6 Clicks" Daily Activity     Outcome Measure Help from another person eating meals?: A Little Help from another person taking care of personal grooming?: A Little Help from another person toileting, which includes using toliet, bedpan, or urinal?: A Lot Help from another person bathing (including washing, rinsing, drying)?: A Lot Help from another person to put on and taking off regular upper body clothing?: A Lot Help from another person to put on and taking off regular lower body clothing?: A Lot 6 Click Score: 14   End of Session Equipment Utilized During Treatment: Gait belt;Rolling walker (2 wheels) Nurse Communication: Mobility status  Activity Tolerance: Patient tolerated treatment well Patient left: in bed;with bed alarm set;with call bell/phone within reach  OT Visit Diagnosis: Unsteadiness on feet (R26.81);Other abnormalities of gait and mobility (R26.89);Muscle weakness (generalized) (M62.81)                Time: [id number] OT Time Calculation (min): 32 min Charges:  OT General Charges $OT Visit: 1 Visit OT Evaluation $OT Eval Moderate Complexity: 1 Mod OT Treatments $Self Care/Home Management : 8-22 mins  Renaye Rakers, OTD, OTR/L SecureChat  Preferred Acute Rehab [phone]  Ulla Gallo 04/12/2022, 1:37 PM

## 2022-04-13 DIAGNOSIS — I5043 Acute on chronic combined systolic (congestive) and diastolic (congestive) heart failure: Secondary | ICD-10-CM | POA: Diagnosis not present

## 2022-04-13 LAB — BASIC METABOLIC PANEL
Anion gap: 9 (ref 5–15)
BUN: 33 mg/dL — ABNORMAL HIGH (ref 8–23)
CO2: 28 mmol/L (ref 22–32)
Calcium: 8.8 mg/dL — ABNORMAL LOW (ref 8.9–10.3)
Chloride: 101 mmol/L (ref 98–111)
Creatinine, Ser: 1.13 mg/dL (ref 0.61–1.24)
GFR, Estimated: >60 mL/min (ref >60)
Glucose, Bld: 92 mg/dL (ref 70–99)
Potassium: 4 mmol/L (ref 3.5–5.1)
Sodium: 138 mmol/L (ref 135–145)

## 2022-04-13 LAB — PROTIME-INR
INR: 3 — ABNORMAL HIGH (ref 0.8–1.2)
Prothrombin Time: 30.5 seconds — ABNORMAL HIGH (ref 11.4–15.2)

## 2022-04-13 LAB — CBC
HCT: 33.6 % — ABNORMAL LOW (ref 39.0–52.0)
Hemoglobin: 11.4 g/dL — ABNORMAL LOW (ref 13.0–17.0)
MCH: 30.6 pg (ref 26.0–34.0)
MCHC: 33.9 g/dL (ref 30.0–36.0)
MCV: 90.3 fL (ref 80.0–100.0)
Platelets: 93 10*3/uL — ABNORMAL LOW (ref 150–400)
RBC: 3.72 MIL/uL — ABNORMAL LOW (ref 4.22–5.81)
RDW: 16.4 % — ABNORMAL HIGH (ref 11.5–15.5)
WBC: 4.9 10*3/uL (ref 4.0–10.5)
nRBC: 0 % (ref 0.0–0.2)

## 2022-04-13 MED ORDER — ISOSORBIDE MONONITRATE ER 30 MG PO TB24
15.0000 mg | ORAL_TABLET | Freq: Every day | ORAL | Status: DC
  Administered 2022-04-14: 15 mg via ORAL
  Filled 2022-04-13: qty 1

## 2022-04-13 MED ORDER — ORAL CARE MOUTH RINSE: 15.0000 mL | OROMUCOSAL | Status: DC | PRN

## 2022-04-13 MED ORDER — FUROSEMIDE 10 MG/ML IJ SOLN
40.0000 mg | Freq: Two times a day (BID) | INTRAMUSCULAR | Status: DC
  Administered 2022-04-13 – 2022-04-14 (×2): 40 mg via INTRAVENOUS
  Filled 2022-04-13 (×2): qty 4

## 2022-04-13 MED ORDER — WARFARIN SODIUM 2.5 MG PO TABS
2.5000 mg | ORAL_TABLET | Freq: Once | ORAL | Status: AC
  Administered 2022-04-13: 2.5 mg via ORAL
  Filled 2022-04-13: qty 1

## 2022-04-13 MED ORDER — LACTULOSE 10 GM/15ML PO SOLN
20.0000 g | Freq: Two times a day (BID) | ORAL | Status: DC
  Administered 2022-04-13 – 2022-04-14 (×2): 20 g via ORAL
  Filled 2022-04-13 (×2): qty 30

## 2022-04-13 NOTE — Progress Notes (Signed)
ANTICOAGULATION CONSULT NOTE - Follow Up Consult  Pharmacy Consult for Warfarin Indication: atrial fibrillation  No Known Allergies  Patient Measurements: Height: '5\' 8"'$  (172.7 cm) Weight: 81.4 kg (179 lb 7.3 oz) IBW/kg (Calculated) : 68.4  Vital Signs: Temp: 98.1 F (36.7 C) (02/29 0800) Temp Source: Oral (02/29 0800) BP: 130/56 (02/29 0800) Pulse Rate: 71 (02/29 0800)  Labs: Recent Labs    04/10/22 1333 04/11/22 0039 04/12/22 0522 04/13/22 0420  HGB 12.2*  --  11.6* 11.4*  HCT 36.0*  --  34.8* 33.6*  PLT  --   --  87* 93*  LABPROT  --  27.4* 31.0* 30.5*  INR  --  2.6* 3.0* 3.0*  CREATININE  --  1.24 1.00 1.13     Estimated Creatinine Clearance: 38.7 mL/min (by C-G formula based on SCr of 1.13 mg/dL).  Assessment: 53 YOM presenting from facility found down, hx of afib on warfarin PTA, INR on admission is therapeutic at 2.6, last dose administered at facility on 2/25.  INR remains upper-range therapeutic (3.0); warfarin 2.5 mg given 2/28 rather than usual Wednesday dose of 5 mg, trying to avoid further increase in INR. Platelet count low and has been low in the past. No bleeding reported.  PTA warfarin regimen: 5 mg MWF, 2.5 mg TTSS  Goal of Therapy:  INR 2-3 Monitor platelets by anticoagulation protocol: Yes   Plan:  Warfarin 2.5 mg x 1 today, usual Thursday dose. Daily PT/INR. Intermittent CBC; monitor low platelet count. Monitor for any sign/symptoms of bleeding.   Arty Baumgartner, RPh 04/13/2022,11:47 AM

## 2022-04-13 NOTE — Care Management Important Message (Signed)
Important Message  Patient Details  Name: Tyler Williams MRN: [id number] Date of Birth: 01/25/1927   Medicare Important Message Given:  Yes     Shelda Altes 04/13/2022, 8:57 AM

## 2022-04-13 NOTE — Progress Notes (Signed)
Mobility Specialist - Progress Note   04/13/22 1300  Mobility  Activity Transferred from chair to bed  Level of Assistance Standby assist, set-up cues, supervision of patient - no hands on  Assistive Device Front wheel walker  Activity Response Tolerated well  Mobility Referral Yes  $Mobility charge 1 Mobility    Pt received in recliner requesting to get back to bed. No physical assistance needed throughout, no complaints. Left in bed w/ bed alarm on and call bell in his lap.   [place] Specialist Please contact via SecureChat or Rehab office at [phone]

## 2022-04-13 NOTE — Progress Notes (Signed)
PROGRESS NOTE    Tyler Williams  [id number] DOB: [birth year] DOA: 04/10/2022 PCP: Burnard Bunting, MD  89/M w/ history of chronic atrial fibrillation, Chronic systolic and diastolic CHF, Core pulmonale, cirrhosis, and aortic stenosis sp TAVR who presented with altered mental status. About 4 days patient was advised to increase his furosemide dose due to edema. Patient had progressive weakness, on the day of admission he was found down on the floor, with confusion. In ED edema and Afib RVR. Labs -  cr 1,30, CT head and neck benign, CT chest with minimally displaced right anterior 5th and 6th ribs, ascites and positive bilateral pleural effusions.  Patient was placed on Iv furosemide for diuresis.   -2/27 lost IV access, not able to place a peripheral line, PICC placed   Subjective: Feels better overall, breathing better   Assessment and Plan:  Acute on chronic combined systolic and diastolic CHF -Echo with EF 45 to 50%, LV with global hypokinesis, RV with moderate to severe systolic function reduction, moderate MR,  sp TAVR,  -Clinically improving he is 6 L negative, creatinine stable at 1.1, continue IV Lasix 1 more day, continue Aldactone, Entresto -Continue low-dose metoprolol, will decrease Imdur dose -Ambulate, PT OT eval, BMP in a.m. -Palliative following, plan for outpatient palliative care follow-up, back to ALF in 1 to 2 days  Decompensated Cirrhosis of liver (HCC) Hepatic encephalopathy CT with cirrhotic liver with worsening ascites.  -with improved mentation, continue lactulose -continue diuretics as noted above  Chronic kidney disease, stage 2 (mild) -stable, monitor w/ diuresis  Permanent atrial fibrillation (HCC) -continue metop and warfarin  Essential hypertension -stable, meds as above  Rib fracture 5th and 6th rib fractures with no pneumothorax. Continue with oral analgesics PT and OT.   Macular degeneration and ambulatory dysfunction.  DVT  prophylaxis: warfarin Code Status: DNR Family Communication: No family at bedside, will update daughter [name] Disposition Plan: Back to ALF in 1 to 2 days  Consultants:    Procedures:   Antimicrobials:    Objective: Vitals:   04/12/22 1924 04/13/22 0105 04/13/22 0424 04/13/22 0800  BP:  (!) 142/64 135/60 (!) 130/56  Pulse: (!) 54 65  71  Resp:      Temp: (!) 97.4 F (36.3 C) 98.1 F (36.7 C) 97.8 F (36.6 C) 98.1 F (36.7 C)  TempSrc: Oral Oral Oral Oral  SpO2: 98% 98%  95%  Weight:  81.4 kg    Height:        Intake/Output Summary (Last 24 hours) at 04/13/2022 1143 Last data filed at 04/13/2022 1019 Gross per 24 hour  Intake 810 ml  Output 4500 ml  Net -3690 ml   Filed Weights   04/11/22 0400 04/12/22 0329 04/13/22 0105  Weight: 82.4 kg 81.4 kg 81.4 kg    Examination:  Chronically ill elderly male sitting up in bed, AAOx3 HEENT: Positive JVD CVS: S1-S2, regular rhythm Lungs: Decreased breath sounds to bases Abdomen: Firm, mildly distended, nontender, bowel sounds present, fluid thrill noted Extremities: 1+ edema with chronic skin changes and scaling    Data Reviewed:   CBC: Recent Labs  Lab 04/10/22 0858 04/10/22 0906 04/10/22 1333 04/12/22 0522 04/13/22 0420  WBC 4.5  --   --  4.9 4.9  HGB 13.0 13.6 12.2* 11.6* 11.4*  HCT 39.9 40.0 36.0* 34.8* 33.6*  MCV 92.6  --   --  90.6 90.3  PLT 88*  --   --  87* 93*   Basic Metabolic Panel: Recent  Labs  Lab 04/10/22 0858 04/10/22 0906 04/10/22 1333 04/11/22 0039 04/12/22 0522 04/13/22 0420  NA 138 140 139 138 139 138  K 4.5 4.5 4.5 4.7 4.0 4.0  CL 103 105  --  104 105 101  CO2 25  --   --  '30 25 28  '$ GLUCOSE 93 87  --  111* 87 92  BUN 43* 47*  --  38* 32* 33*  CREATININE 1.31* 1.30*  --  1.24 1.00 1.13  CALCIUM 9.2  --   --  8.8* 8.7* 8.8*   GFR: Estimated Creatinine Clearance: 38.7 mL/min (by C-G formula based on SCr of 1.13 mg/dL). Liver Function Tests: Recent Labs  Lab 04/10/22 0858   AST 25  ALT 13  ALKPHOS 98  BILITOT 1.5*  PROT 6.8  ALBUMIN 3.2*   No results for input(s): "LIPASE", "AMYLASE" in the last 168 hours. Recent Labs  Lab 04/10/22 2110  AMMONIA 19   Coagulation Profile: Recent Labs  Lab 04/10/22 0858 04/11/22 0039 04/12/22 0522 04/13/22 0420  INR 2.6* 2.6* 3.0* 3.0*   Cardiac Enzymes: Recent Labs  Lab 04/10/22 0858  CKTOTAL 52   BNP (last 3 results) No results for input(s): "PROBNP" in the last 8760 hours. HbA1C: No results for input(s): "HGBA1C" in the last 72 hours. CBG: No results for input(s): "GLUCAP" in the last 168 hours. Lipid Profile: No results for input(s): "CHOL", "HDL", "LDLCALC", "TRIG", "CHOLHDL", "LDLDIRECT" in the last 72 hours. Thyroid Function Tests: Recent Labs    04/10/22 1309  TSH 2.652   Anemia Panel: No results for input(s): "VITAMINB12", "FOLATE", "FERRITIN", "TIBC", "IRON", "RETICCTPCT" in the last 72 hours. Urine analysis:    Component Value Date/Time   COLORURINE YELLOW 04/10/2022 2011   APPEARANCEUR CLEAR 04/10/2022 2011   APPEARANCEUR Clear 10/05/2021 1516   LABSPEC 1.020 04/10/2022 2011   PHURINE 5.0 04/10/2022 2011   GLUCOSEU NEGATIVE 04/10/2022 2011   HGBUR LARGE (A) 04/10/2022 2011   BILIRUBINUR NEGATIVE 04/10/2022 2011   BILIRUBINUR Negative 10/05/2021 [place] 04/10/2022 2011   PROTEINUR NEGATIVE 04/10/2022 2011   UROBILINOGEN 0.2 09/27/2013 2341   NITRITE NEGATIVE 04/10/2022 2011   LEUKOCYTESUR NEGATIVE 04/10/2022 2011   Sepsis Labs: '@LABRCNTIP'$ (procalcitonin:4,lacticidven:4)  )No results found for this or any previous visit (from the past 240 hour(s)).   Radiology Studies: DG CHEST PORT 1 VIEW  Result Date: 04/11/2022 CLINICAL DATA:  PICC line placement EXAM: PORTABLE CHEST 1 VIEW COMPARISON:  12/22/2021 FINDINGS: Right upper extremity PICC line catheter is in place with its tip within the superior vena cava. Stable cardiomegaly. Mild bilateral perihilar and  lower lung zone interstitial and alveolar pulmonary infiltrate and small bilateral pleural effusions are present, left greater than right in keeping with changes of mild to moderate cardiogenic failure. Transcatheter aortic valve replacement has been performed. No pneumothorax. No acute bone abnormality. IMPRESSION: 1. Right upper extremity PICC line catheter in appropriate position. 2. Mild to moderate cardiogenic failure with small bilateral pleural effusions. Electronically Signed   By: Fidela Salisbury M.D.   On: 04/11/2022 19:56   [place] EKG SITE RITE  Result Date: 04/11/2022 If [facility] image not attached, placement could not be confirmed due to current cardiac rhythm.    Scheduled Meds:  atorvastatin  20 mg Oral Daily   Chlorhexidine Gluconate Cloth  6 each Topical Daily   fesoterodine  8 mg Oral Daily   finasteride  5 mg Oral Daily   furosemide  60 mg Intravenous BID  isosorbide mononitrate  60 mg Oral Daily   lactulose  20 g Oral BID   lidocaine  1 patch Transdermal Q24H   metoprolol succinate  12.5 mg Oral Daily   polyethylene glycol  17 g Oral Daily   sacubitril-valsartan  1 tablet Oral BID   sodium chloride flush  10-40 mL Intracatheter Q12H   sodium chloride flush  3 mL Intravenous Q12H   spironolactone  25 mg Oral Daily   tamsulosin  0.4 mg Oral Daily   Warfarin - Pharmacist Dosing Inpatient   Does not apply q1600   Continuous Infusions:  sodium chloride       LOS: 3 days    Time spent: 41mn    PDomenic Polite MD [facility]   04/13/2022, 11:43 AM

## 2022-04-13 NOTE — Progress Notes (Signed)
Physical Therapy Treatment Patient Details Name: Tyler Williams MRN: [id number] DOB: [birth year] Today's Date: 04/13/2022   History of Present Illness Tyler Williams is a 89 y.o. male with medical history significant of chronic A-fib on Coumadin, chronic HFrEF LVEF 40-45%, chronic right-sided heart failure with cor pulmonale, cirrhosis, CAD, aortic stenosis status post TAVR, macular degeneration with poor vision, GERD, BPH, was found on the floor confused    PT Comments    Pt tolerated treatment well today. Pt was able to increase ambulation distance however demonstrated a much slower gait pattern compared to previous session. Pt would benefit from continued gait training during acute stay. Pt DC rec change from SNF to HHPT at ALF. No change in DME recs as pt feels he is more steady with RW versus rollator.  Recommendations for follow up therapy are one component of a multi-disciplinary discharge planning process, led by the attending physician.  Recommendations may be updated based on patient status, additional functional criteria and insurance authorization.  Follow Up Recommendations  Home health PT (HHPT at ALF)     Assistance Recommended at Discharge Intermittent Supervision/Assistance  Patient can return home with the following A little help with walking and/or transfers;A little help with bathing/dressing/bathroom;Assistance with cooking/housework;Assistance with feeding;Help with stairs or ramp for entrance   Equipment Recommendations  Rolling walker (2 wheels)    Recommendations for Other Services       Precautions / Restrictions Precautions Precautions: Fall;Other (comment) Precaution Comments: R rib fx Restrictions Weight Bearing Restrictions: No     Mobility  Bed Mobility Overal bed mobility: Needs Assistance Bed Mobility: Supine to Sit     Supine to sit: Supervision          Transfers Overall transfer level: Needs assistance Equipment used:  Rolling walker (2 wheels) Transfers: Sit to/from Stand Sit to Stand: Min guard, Min assist           General transfer comment: 1 LOB when first standing and required Min A to correct.    Ambulation/Gait Ambulation/Gait assistance: Min guard Gait Distance (Feet): 450 Feet Assistive device: Rolling walker (2 wheels) Gait Pattern/deviations: WFL(Within Functional Limits) Gait velocity: decreased     General Gait Details: Much slower gait compared to previous session but overall stable. Pt has macular degeneration at baseline and was able to navigate larger obstables however had some difficulty with smaller obstables.   Stairs             Wheelchair Mobility    Modified Rankin (Stroke Patients Only)       Balance Overall balance assessment: Mild deficits observed, not formally tested                                          Cognition Arousal/Alertness: Awake/alert Behavior During Therapy: WFL for tasks assessed/performed, Anxious Overall Cognitive Status: History of cognitive impairments - at baseline                                          Exercises      General Comments General comments (skin integrity, edema, etc.): VSS on RA      Pertinent Vitals/Pain Pain Assessment Pain Assessment: No/denies pain    Home Living  Prior Function            PT Goals (current goals can now be found in the care plan section) Progress towards PT goals: Progressing toward goals    Frequency    Min 3X/week      PT Plan Discharge plan needs to be updated;Current plan remains appropriate    Co-evaluation              AM-PAC PT "6 Clicks" Mobility   Outcome Measure  Help needed turning from your back to your side while in a flat bed without using bedrails?: None Help needed moving from lying on your back to sitting on the side of a flat bed without using bedrails?: None Help needed  moving to and from a bed to a chair (including a wheelchair)?: A Little Help needed standing up from a chair using your arms (e.g., wheelchair or bedside chair)?: A Little Help needed to walk in hospital room?: A Little Help needed climbing 3-5 steps with a railing? : A Lot 6 Click Score: 19    End of Session Equipment Utilized During Treatment: Gait belt Activity Tolerance: Patient tolerated treatment well Patient left: in chair;with call bell/phone within reach;with chair alarm set Nurse Communication: Mobility status PT Visit Diagnosis: Other abnormalities of gait and mobility (R26.89);Unsteadiness on feet (R26.81)     Time: [id number] PT Time Calculation (min) (ACUTE ONLY): 30 min  Charges:  $Gait Training: 23-37 mins                     Shelby Mattocks, PT, DPT Acute Rehab Services [id number]    Viann Shove 04/13/2022, 11:08 AM

## 2022-04-14 DIAGNOSIS — I5043 Acute on chronic combined systolic (congestive) and diastolic (congestive) heart failure: Secondary | ICD-10-CM | POA: Diagnosis not present

## 2022-04-14 LAB — BASIC METABOLIC PANEL
Anion gap: 8 (ref 5–15)
BUN: 34 mg/dL — ABNORMAL HIGH (ref 8–23)
CO2: 30 mmol/L (ref 22–32)
Calcium: 8.7 mg/dL — ABNORMAL LOW (ref 8.9–10.3)
Chloride: 98 mmol/L (ref 98–111)
Creatinine, Ser: 1.1 mg/dL (ref 0.61–1.24)
GFR, Estimated: >60 mL/min (ref >60)
Glucose, Bld: 94 mg/dL (ref 70–99)
Potassium: 3.7 mmol/L (ref 3.5–5.1)
Sodium: 136 mmol/L (ref 135–145)

## 2022-04-14 LAB — PROTIME-INR
INR: 2.5 — ABNORMAL HIGH (ref 0.8–1.2)
Prothrombin Time: 27 seconds — ABNORMAL HIGH (ref 11.4–15.2)

## 2022-04-14 MED ORDER — ISOSORBIDE MONONITRATE ER 30 MG PO TB24: 30.0000 mg | ORAL_TABLET | Freq: Every day | ORAL | 0 refills | Status: AC

## 2022-04-14 MED ORDER — LACTULOSE 10 GM/15ML PO SOLN: 20.0000 g | Freq: Every day | ORAL | 0 refills | Status: AC

## 2022-04-14 NOTE — NC FL2 (Signed)
[place] MEDICAID FL2 LEVEL OF CARE FORM     IDENTIFICATION  Patient Name: Tyler Williams Birthdate: 11-Nov-1926 Sex: male Admission Date (Current Location): 04/10/2022  [facility] and [place] Number:  Herbalist and Address:  [facility] [place] [address], [place], [place] [address]      Provider Number: [id number]  Attending Physician Name and Address:  No att. providers found  Relative Name and Phone Number:       Current Level of Care: Hospital Recommended Level of Care: [place] Prior Approval Number:    Date Approved/Denied:   PASRR Number:    Discharge Plan: Other (Comment) ([facility])    Current Diagnoses: Patient Active Problem List   Diagnosis Date Noted   Rib fracture 04/11/2022   Acute encephalopathy 04/10/2022   CHF (congestive heart failure) ([place]) 04/10/2022   Acute on chronic systolic (congestive) heart failure ([place]) 12/22/2021   UTI (urinary tract infection) 12/22/2021   Chest pain 11/21/2021   Elevated troponin 11/21/2021   Normocytic anemia 11/21/2021   Chronic kidney disease, stage III (moderate) ([place]) 11/21/2021   Thrombocytopenia ([place]) 11/21/2021   BPH (benign prostatic hyperplasia) 11/21/2021   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) 10/05/2021   Hypotension 10/05/2021   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   Pain of left hand 08/12/2020   Abrasion of unspecified back wall of thorax, initial encounter 07/23/2020   Bite of nonvenomous arthropod 07/23/2020   Fall 07/23/2020   Hearing loss 07/23/2020   Laceration without foreign body of right elbow, initial encounter 07/23/2020   Influenza A    Gross hematuria    DCM (dilated cardiomyopathy) ([place])    Chronic atrial fibrillation (HCC)    Congestive heart failure ([place]) 06/12/2020   Dyspnea 04/24/2019   Chronic obstructive pulmonary disease ([place]) 06/10/2018   Hardening of the aorta (main artery of the heart) ([place]) 06/10/2018    Chronic kidney disease, stage 2 (mild) 04/24/2018   Hypertensive heart and chronic kidney disease with heart failure and stage 1 through stage 4 chronic kidney disease, or unspecified chronic kidney disease ([place]) 04/24/2018   Aorta aneurysm ([place]) 02/14/2018   Cirrhosis of liver ([place]) 02/08/2018   Constipation 01/15/2018   Acute on chronic combined systolic and diastolic CHF (congestive heart failure) ([place]) 09/25/2017   Precordial chest pain 08/28/2017   Anticoagulated on Coumadin 08/28/2017   S/P TAVR (transcatheter aortic valve replacement)    Presence of prosthetic heart valve 09/06/2016   S/P TAVR (transcatheter aortic valve replacement) 06/13/2016   Incidental cecal mass noted on CT imaging 05/24/2016   Inguinal hernia 02/24/2016   Exudative age-related macular 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 ([place]) 12/22/2014   Non-thrombocytopenic purpura ([place]) 08/19/2014   Encounter for therapeutic drug monitoring 03/20/2013   Pseudophakia of both eyes 03/19/2013   Macular degeneration 03/19/2013   Cholecystitis, acute 12/06/2012   Chest pain, atypical 12/03/2012   Hematuria, microscopic 12/03/2012   Anxiety disorder 06/20/2012   Long term (current) use of anticoagulants 06/20/2012   Cystoid macular edema 05/14/2012   Overweight (BMI 25.0-29.9) 01/23/2012   Hyponatremia 01/23/2012   S/P right UKR 01/22/2012   Carotid artery bruit 11/21/2011   Carotid bruit 11/21/2011   Presence of intraocular lens 02/16/2011   Osteoarthritis 07/05/2010   EDEMA 07/14/2009   Essential hypertension 01/27/2009   HYPERCHOLESTEROLEMIA  IIA 10/21/2008   CAD (coronary artery disease) 10/21/2008   Permanent atrial fibrillation ([place]) 10/21/2008    Orientation RESPIRATION  BLADDER Height & Weight     Self, Situation, Time, Place  Normal External catheter, Continent Weight: 179 lb 7.3 oz (81.4 kg) Height:  '5\' 8"'$  (172.7 cm)  BEHAVIORAL SYMPTOMS/MOOD NEUROLOGICAL BOWEL NUTRITION  STATUS      Continent Diet (See dc summary)  AMBULATORY STATUS COMMUNICATION OF NEEDS Skin   Limited Assist Verbally Normal                       Personal Care Assistance Level of Assistance  Bathing, Dressing, Feeding Bathing Assistance: Limited assistance Feeding assistance: Limited assistance Dressing Assistance: Limited assistance     Functional Limitations Info  Sight, Hearing, Speech Sight Info: Adequate Hearing Info: Adequate Speech Info: Adequate    SPECIAL CARE FACTORS FREQUENCY  PT (By licensed PT), OT (By licensed OT)     PT Frequency: 2xweek OT Frequency: 2xweek            Contractures Contractures Info: Not present    Additional Factors Info  Code Status Code Status Info: DNR             Current Medications (04/14/2022):  This is the current hospital active medication list Current Facility-Administered Medications  Medication Dose Route Frequency Provider Last Rate Last Admin   0.9 %  sodium chloride infusion  250 mL Intravenous PRN Wynetta Fines T, MD       acetaminophen (TYLENOL) tablet 500 mg  500 mg Oral Q4H PRN Wynetta Fines T, MD       acetaminophen (TYLENOL) tablet 650 mg  650 mg Oral Q4H PRN Wynetta Fines T, MD       atorvastatin (LIPITOR) tablet 20 mg  20 mg Oral Daily Wynetta Fines T, MD   20 mg at 04/14/22 0820   Chlorhexidine Gluconate Cloth 2 % PADS 6 each  6 each Topical Daily Arrien, Jimmy Picket, MD   6 each at 04/13/22 1055   fesoterodine (TOVIAZ) tablet 8 mg  8 mg Oral Daily Wynetta Fines T, MD   8 mg at 04/14/22 0820   finasteride (PROSCAR) tablet 5 mg  5 mg Oral Daily Wynetta Fines T, MD   5 mg at 04/14/22 0819   furosemide (LASIX) injection 40 mg  40 mg Intravenous BID Domenic Polite, MD   40 mg at 04/14/22 0830   isosorbide mononitrate (IMDUR) 24 hr tablet 15 mg  15 mg Oral Daily Domenic Polite, MD   15 mg at 04/14/22 0820   lactulose (CHRONULAC) 10 GM/15ML solution 20 g  20 g Oral BID Domenic Polite, MD   20 g at 04/14/22 0819    lidocaine (LIDODERM) 5 % 1 patch  1 patch Transdermal Q24H Godfrey Pick, MD   1 patch at 04/13/22 1331   LORazepam (ATIVAN) tablet 0.5 mg  0.5 mg Oral BID PRN Wynetta Fines T, MD       menthol-cetylpyridinium (CEPACOL) lozenge 3 mg  1 lozenge Oral PRN Domenic Polite, MD   3 mg at 04/12/22 1209   metoprolol succinate (TOPROL-XL) 24 hr tablet 12.5 mg  12.5 mg Oral Daily Wynetta Fines T, MD   12.5 mg at 04/14/22 0820   ondansetron (ZOFRAN) injection 4 mg  4 mg Intravenous Q6H PRN Lequita Halt, MD       Oral care mouth rinse  15 mL Mouth Rinse PRN Domenic Polite, MD       polyethylene glycol (MIRALAX / GLYCOLAX) packet 17 g  17 g Oral Daily Lequita Halt, MD  17 g at 04/14/22 0819   sacubitril-valsartan (ENTRESTO) 24-26 mg per tablet  1 tablet Oral BID Wynetta Fines T, MD   1 tablet at 04/14/22 [id number]   sodium chloride flush (NS) 0.9 % injection 10-40 mL  10-40 mL Intracatheter Q12H Arrien, Jimmy Picket, MD   10 mL at 04/14/22 0825   sodium chloride flush (NS) 0.9 % injection 10-40 mL  10-40 mL Intracatheter PRN Arrien, Jimmy Picket, MD       sodium chloride flush (NS) 0.9 % injection 3 mL  3 mL Intravenous Q12H Wynetta Fines T, MD   3 mL at 04/14/22 0825   sodium chloride flush (NS) 0.9 % injection 3 mL  3 mL Intravenous PRN Wynetta Fines T, MD       spironolactone (ALDACTONE) tablet 25 mg  25 mg Oral Daily Wynetta Fines T, MD   25 mg at 04/14/22 0819   tamsulosin (FLOMAX) capsule 0.4 mg  0.4 mg Oral Daily Wynetta Fines T, MD   0.4 mg at 04/14/22 [id number]   Warfarin - Pharmacist Dosing Inpatient   Does not apply q1600 Bertis Ruddy, [facility]   Given at 04/12/22 1658   Current Outpatient Medications  Medication Sig Dispense Refill   warfarin (COUMADIN) 2.5 MG tablet Take 2.5-5 mg by mouth See admin instructions. Take 2.5 mg by mouth on Sunday, Tuesday, Thursday and Saturday. Take '5mg'$  by mouth on Monday, Wednesday and Friday.     acetaminophen (TYLENOL) 500 MG tablet Take 500 mg by mouth every 4 (four) hours as  needed for moderate pain.     atorvastatin (LIPITOR) 20 MG tablet TAKE 1 TABLET ONCE DAILY. 90 tablet 3   Calcium-Magnesium-Zinc (CAL-MAG-ZINC PO) Take 1 tablet by mouth every morning.     Cholecalciferol (VITAMIN D-3) 125 MCG (5000 UT) TABS Take 5,000 Units by mouth every morning.     fesoterodine (TOVIAZ) 8 MG TB24 tablet Take 8 mg by mouth daily.     finasteride (PROSCAR) 5 MG tablet Take 1 tablet (5 mg total) by mouth daily. 30 tablet 0   furosemide (LASIX) 40 MG tablet Take 1.5 tablets (60 mg total) by mouth daily. 135 tablet 3   isosorbide mononitrate (IMDUR) 30 MG 24 hr tablet Take 1 tablet (30 mg total) by mouth daily. 30 tablet 0   lactulose (CHRONULAC) 10 GM/15ML solution Take 30 mLs (20 g total) by mouth daily. 236 mL 0   LORazepam (ATIVAN) 1 MG tablet Take 1 mg by mouth 2 (two) times daily as needed for anxiety.     metoprolol succinate (TOPROL XL) 25 MG 24 hr tablet Take 0.5 tablets (12.5 mg total) by mouth daily. 45 tablet 3   Multiple Vitamins-Minerals (PRESERVISION AREDS) TABS Take 1 tablet by mouth daily.     nitroGLYCERIN (NITROSTAT) 0.4 MG SL tablet Place 0.4 mg under the tongue every 5 (five) minutes as needed for chest pain.     nystatin cream (MYCOSTATIN) Apply 1 Application topically 2 (two) times daily.     polyethylene glycol (MIRALAX / GLYCOLAX) 17 g packet Take 17 g by mouth daily as needed. 14 each 0   Polyethylene Glycol 3350 POWD Take 17 g by mouth daily.     sacubitril-valsartan (ENTRESTO) 24-26 MG Take 1 tablet by mouth 2 (two) times daily. Please keep upcoming appointment for future refills thank you. 60 tablet 0   spironolactone (ALDACTONE) 25 MG tablet Take 1 tablet (25 mg total) by mouth daily. 45 tablet 3   tamsulosin (FLOMAX) 0.4 MG  CAPS capsule Take 1 capsule (0.4 mg total) by mouth daily. 30 capsule 3     Discharge Medications: Please see discharge summary for a list of discharge medications.  Relevant Imaging Results:  Relevant Lab  Results:   Additional Information SSN:[id number]  Beckey Rutter, MSW, LCSWA, LCASA Transitions of Care  Clinical Social Worker I

## 2022-04-14 NOTE — Progress Notes (Signed)
Occupational Therapy Treatment Patient Details Name: Tyler Williams MRN: [id number] DOB: 1926/08/12 Today's Date: 04/14/2022   History of present illness Tyler Williams is a 89 y.o. male with medical history significant of chronic A-fib on Coumadin, chronic HFrEF LVEF 40-45%, chronic right-sided heart failure with cor pulmonale, cirrhosis, CAD, aortic stenosis status post TAVR, macular degeneration with poor vision, GERD, BPH, was found on the floor confused   OT comments  This 89 yo male admitted with above seen today to focus on UB/LBD and transfers. He can do UB with setup/S and needs Mod A for LBD. He is Min A sit<>stand from EOB and min A to ambulate with RW. He will continue to benefit from OT her and back at ALF.   Recommendations for follow up therapy are one component of a multi-disciplinary discharge planning process, led by the attending physician.  Recommendations may be updated based on patient status, additional functional criteria and insurance authorization.    Follow Up Recommendations  Home health OT (at ALF)     Assistance Recommended at Discharge Frequent or constant Supervision/Assistance  Patient can return home with the following  A lot of help with walking and/or transfers;A lot of help with bathing/dressing/bathroom;Assistance with cooking/housework;Help with stairs or ramp for entrance;Assist for transportation;Direct supervision/assist for financial management;Direct supervision/assist for medications management   Equipment Recommendations  None recommended by OT       Precautions / Restrictions Precautions Precautions: Fall Restrictions Weight Bearing Restrictions: No       Mobility Bed Mobility Overal bed mobility: Needs Assistance Bed Mobility: Supine to Sit     Supine to sit: HOB elevated     General bed mobility comments: increased time and use of rail (pt reports he has a rail on his regular bed at [facility]       Balance  Overall balance assessment: Needs assistance Sitting-balance support: No upper extremity supported, Feet supported Sitting balance-Leahy Scale: Good Sitting balance - Comments: sits unsupported at EOB while donning pullover shirt   Standing balance support: No upper extremity supported Standing balance-Leahy Scale: Fair Standing balance comment: min guard A while he pulled up pants                           ADL either performed or assessed with clinical judgement   ADL Overall ADL's : Needs assistance/impaired     Grooming: Brushing hair;Oral care;Set up;Sitting Grooming Details (indicate cue type and reason): in recliner         Upper Body Dressing : Set up;Sitting Upper Body Dressing Details (indicate cue type and reason): EOB Lower Body Dressing: Moderate assistance Lower Body Dressing Details (indicate cue type and reason): min A sit<>stand Toilet Transfer: Minimal assistance;Stand-pivot;Rolling walker (2 wheels) Toilet Transfer Details (indicate cue type and reason): simulated bed to recliner                Extremity/Trunk Assessment Upper Extremity Assessment Upper Extremity Assessment: Overall WFL for tasks assessed            Vision Baseline Vision/History: 1 Wears glasses Patient Visual Report: No change from baseline            Cognition Arousal/Alertness: Awake/alert Behavior During Therapy: WFL for tasks assessed/performed Overall Cognitive Status: Impaired/Different from baseline  General Comments: Kept asking me to go get items for him and instructing where they were (as if he was at [place]); even after asking him where he was and he reports hospital.                   Pertinent Vitals/ Pain       Pain Assessment Pain Assessment: No/denies pain         Frequency  Min 2X/week        Progress Toward Goals  OT Goals(current goals can now be found in the care plan  section)  Progress towards OT goals: Progressing toward goals  Acute Rehab OT Goals Patient Stated Goal: to go back to [facility] today OT Goal Formulation: With patient Time For Goal Achievement: 04/26/22 Potential to Achieve Goals: Good  Plan Discharge plan remains appropriate       AM-PAC OT "6 Clicks" Daily Activity     Outcome Measure   Help from another person eating meals?: None Help from another person taking care of personal grooming?: A Little Help from another person toileting, which includes using toliet, bedpan, or urinal?: A Lot Help from another person bathing (including washing, rinsing, drying)?: A Lot Help from another person to put on and taking off regular upper body clothing?: A Lot Help from another person to put on and taking off regular lower body clothing?: A Lot 6 Click Score: 15    End of Session Equipment Utilized During Treatment: Rolling walker (2 wheels)  OT Visit Diagnosis: Unsteadiness on feet (R26.81);Other abnormalities of gait and mobility (R26.89);Muscle weakness (generalized) (M62.81);Other symptoms and signs involving cognitive function   Activity Tolerance Patient tolerated treatment well   Patient Left in chair;with call bell/phone within reach;with chair alarm set   Nurse Communication Mobility status (primo fit still on until he discharges)        Time: [id number] OT Time Calculation (min): 30 min  Charges: OT General Charges $OT Visit: 1 Visit OT Treatments $Self Care/Home Management : 23-37 mins  [place] Office [phone]    Almon Register 04/14/2022, 11:16 AM

## 2022-04-14 NOTE — TOC Progression Note (Addendum)
Transition of Care [facility]) - Progression Note    Patient Details  Name: Tyler Williams MRN: [id number] Date of Birth: 18-Oct-1926  [facility] CM/SW Contact  Zenon Mayo, RN Phone Number: 04/14/2022, 11:19 AM  Clinical Narrative:    Son was here to transport patient ,but patient has PICC in that needs to be d/c before he can leave.  [facility] is also following for [facility] for med management. NCM asked MD for order.         Expected Discharge Plan and Services         Expected Discharge Date: 04/14/22                                     Social Determinants of Health (SDOH) Interventions [place]: No Food Insecurity (12/23/2021)  Housing: Low Risk  (12/23/2021)  Transportation Needs: No Transportation Needs (12/23/2021)  Utilities: Not At Risk (12/23/2021)  Depression (PHQ2-9): Low Risk  (12/06/2018)  Tobacco Use: Low Risk  (01/04/2022)    Readmission Risk Interventions    12/24/2021    3:52 PM  Readmission Risk Prevention Plan  Transportation Screening Complete  PCP or Specialist Appt within 3-5 Days Complete  HRI or [place] Complete  Social Work Consult for [place] Planning/Counseling Complete  Palliative Care Screening Not Applicable  Medication Review [employer]) Complete

## 2022-04-14 NOTE — Discharge Summary (Signed)
Physician Discharge Summary  Tyler Williams [id number] DOB: April 03, 1926 DOA: 04/10/2022  PCP: Burnard Bunting, MD  Admit date: 04/10/2022 Discharge date: 04/14/2022  Time spent: 45 minutes  Recommendations for Outpatient Follow-up:  Outpatient palliative care, recommend transition to hospice as he declines further PCP in 1 week   Discharge Diagnoses:  Principal Problem:   Acute on chronic combined systolic and diastolic CHF (congestive heart failure) (HCC) Hepatic encephalopathy Decompensated liver cirrhosis   Cirrhosis of liver (HCC)   Chronic kidney disease, stage 2 (mild)   Permanent atrial fibrillation ([place])   Essential hypertension   Rib fracture   Discharge Condition: Improved  Diet recommendation: Low-sodium, heart healthy  Filed Weights   04/12/22 0329 04/13/22 0105 04/14/22 0000  Weight: 81.4 kg 81.4 kg 81.4 kg    History of present illness:  89/M w/ history of chronic atrial fibrillation, Chronic systolic and diastolic CHF, Core pulmonale, cirrhosis, and aortic stenosis sp TAVR who presented with altered mental status. About 4 days patient was advised to increase his furosemide dose due to edema. Patient had progressive weakness, on the day of admission he was found down on the floor, with confusion. In ED edema and Afib RVR. Labs -  cr 1,30, CT head and neck benign, CT chest with minimally displaced right anterior 5th and 6th ribs, ascites and positive bilateral pleural effusions.   Hospital Course:   Acute on chronic combined systolic and diastolic CHF -Echo with EF 45 to 50%, LV with global hypokinesis, RV with moderate to severe systolic function reduction, moderate MR,  sp TAVR,  -Admitted with significant volume overload, diuresed well, he is 7.8 L negative -Transitioned back to oral Lasix, Aldactone, Entresto -Continued on metoprolol, Imdur dose decreased -Seen by palliative care, remains DNR, for outpatient palliative care, recommend transition to  hospice services as he declines further   Decompensated Cirrhosis of liver ([place]) Hepatic encephalopathy CT with cirrhotic liver with worsening ascites.  -with improved mentation, continue lactulose -continue diuretics as noted above   Chronic kidney disease, stage 2 (mild) -stable, monitor w/ diuresis   Permanent atrial fibrillation (HCC) -continue metop and warfarin   Essential hypertension -stable, meds as above   Rib fracture 5th and 6th rib fractures with no pneumothorax. Continue with oral analgesics PT and OT.    Macular degeneration and ambulatory dysfunction.   Code Status: DNR  Consultations: Palliative care  Discharge Exam: Vitals:   04/14/22 0731 04/14/22 0820  BP: 136/66   Pulse: (!) 58 68  Resp: 16   Temp: 97.9 F (36.6 C)   SpO2: 97%    Gen: Awake, Alert, Oriented X 2  HEENT: no JVD Lungs: Good air movement bilaterally, CTAB CVS: S1S2/RRR Abd: soft, Non tender, non distended, BS present Extremities: Mild chronic lower extremity edema with chronic skin changes and scaling Skin: As above  Discharge Instructions   Discharge Instructions     Diet - low sodium heart healthy   Complete by: As directed    Increase activity slowly   Complete by: As directed       Allergies as of 04/14/2022   No Known Allergies      Medication List     TAKE these medications    acetaminophen 500 MG tablet Commonly known as: TYLENOL Take 500 mg by mouth every 4 (four) hours as needed for moderate pain.   atorvastatin 20 MG tablet Commonly known as: LIPITOR TAKE 1 TABLET ONCE DAILY.   CAL-MAG-ZINC PO Take 1 tablet by mouth every  morning.   Entresto 24-26 MG Generic drug: sacubitril-valsartan Take 1 tablet by mouth 2 (two) times daily. Please keep upcoming appointment for future refills thank you.   fesoterodine 8 MG Tb24 tablet Commonly known as: TOVIAZ Take 8 mg by mouth daily.   finasteride 5 MG tablet Commonly known as: PROSCAR Take 1  tablet (5 mg total) by mouth daily.   furosemide 40 MG tablet Commonly known as: LASIX Take 1.5 tablets (60 mg total) by mouth daily.   isosorbide mononitrate 30 MG 24 hr tablet Commonly known as: IMDUR Take 1 tablet (30 mg total) by mouth daily. What changed:  medication strength how much to take   lactulose 10 GM/15ML solution Commonly known as: CHRONULAC Take 30 mLs (20 g total) by mouth daily.   LORazepam 1 MG tablet Commonly known as: ATIVAN Take 1 mg by mouth 2 (two) times daily as needed for anxiety.   metoprolol succinate 25 MG 24 hr tablet Commonly known as: Toprol XL Take 0.5 tablets (12.5 mg total) by mouth daily.   nitroGLYCERIN 0.4 MG SL tablet Commonly known as: NITROSTAT Place 0.4 mg under the tongue every 5 (five) minutes as needed for chest pain.   nystatin cream Commonly known as: MYCOSTATIN Apply 1 Application topically 2 (two) times daily.   polyethylene glycol 17 g packet Commonly known as: MIRALAX / GLYCOLAX Take 17 g by mouth daily as needed.   Polyethylene Glycol 3350 Powd Take 17 g by mouth daily.   PreserVision AREDS Tabs Take 1 tablet by mouth daily.   spironolactone 25 MG tablet Commonly known as: ALDACTONE Take 1 tablet (25 mg total) by mouth daily.   tamsulosin 0.4 MG Caps capsule Commonly known as: FLOMAX Take 1 capsule (0.4 mg total) by mouth daily.   Vitamin D-3 125 MCG (5000 UT) Tabs Take 5,000 Units by mouth every morning.   warfarin 2.5 MG tablet Commonly known as: COUMADIN Take 2.5-5 mg by mouth See admin instructions. Take 2.5 mg by mouth on Sunday, Tuesday, Thursday and Saturday. Take '5mg'$  by mouth on Monday, Wednesday and Friday.       No Known Allergies  Follow-up Information     [facility] Follow up.   Why: HHPT, HHOT onsite at facility Contact information: [phone]                 The results of significant diagnostics from this hospitalization (including imaging, microbiology, ancillary and  laboratory) are listed below for reference.    Significant Diagnostic Studies: DG CHEST PORT 1 VIEW  Result Date: 04/11/2022 CLINICAL DATA:  PICC line placement EXAM: PORTABLE CHEST 1 VIEW COMPARISON:  12/22/2021 FINDINGS: Right upper extremity PICC line catheter is in place with its tip within the superior vena cava. Stable cardiomegaly. Mild bilateral perihilar and lower lung zone interstitial and alveolar pulmonary infiltrate and small bilateral pleural effusions are present, left greater than right in keeping with changes of mild to moderate cardiogenic failure. Transcatheter aortic valve replacement has been performed. No pneumothorax. No acute bone abnormality. IMPRESSION: 1. Right upper extremity PICC line catheter in appropriate position. 2. Mild to moderate cardiogenic failure with small bilateral pleural effusions. Electronically Signed   By: Fidela Salisbury M.D.   On: 04/11/2022 19:56   [place] EKG SITE RITE  Result Date: 04/11/2022 If [facility] image not attached, placement could not be confirmed due to current cardiac rhythm.  CT CHEST ABDOMEN PELVIS W CONTRAST  Result Date: 04/10/2022 CLINICAL DATA:  89 year old male found  down at 0730 hours today. Atrial fibrillation, on warfarin. EXAM: CT CHEST, ABDOMEN, AND PELVIS WITH CONTRAST TECHNIQUE: Multidetector CT imaging of the chest, abdomen and pelvis was performed following the standard protocol during bolus administration of intravenous contrast. RADIATION DOSE REDUCTION: This exam was performed according to the departmental dose-optimization program which includes automated exposure control, adjustment of the mA and/or kV according to patient size and/or use of iterative reconstruction technique. CONTRAST:  78m OMNIPAQUE IOHEXOL 350 MG/ML SOLN COMPARISON:  Cervical spine CT today. Prior chest CT 12/05/2019 and CT Abdomen and Pelvis 02/08/2018. FINDINGS: CT CHEST FINDINGS Cardiovascular: Chronic aortic valve replacement or TAVR. Cardiomegaly  appears mildly increased since 2019. Calcified aortic atherosclerosis. Calcified coronary artery atherosclerosis. No pericardial effusion. Thoracic aorta and other central mediastinal vascular structures appear intact. Mediastinum/Nodes: No mediastinal hematoma or lymphadenopathy identified. Lungs/Pleura: Fairly large layering bilateral pleural effusions, only trace on the 2021 chest CT. Simple fluid density favoring transudate. Superimposed mild respiratory motion. Major airways remain patent. Compressive bilateral lower lobe atelectasis. Lingula atelectasis and/or some pleural fluid tracking in the major fissure there. No pneumothorax. No convincing pulmonary contusion. Musculoskeletal: Osteopenia. Mild T4 superior endplate compression is new since the 2021 chest CT, but is visible on previous two-view chest x-ray 12/22/2021 and chronic. No retropulsion. Other thoracic vertebral height appears stable. No sternal fracture. Visible shoulder osseous structures appear intact. Right anterior minimally displaced 5th rib fracture on series 4, image 106 is new since 2021, as is a similar fracture of the anterior 6th rib on image 120. No other right rib fracture identified. No left rib fracture identified. CT ABDOMEN PELVIS FINDINGS Hepatobiliary: Nodular and cirrhotic liver. Chronically absent gallbladder. No discrete liver mass. Perihepatic ascites has progressed since 2019. Pancreas: Negative. Spleen: No splenomegaly.  Perisplenic ascites. Adrenals/Urinary Tract: No adrenal or renal injury identified. Chronic benign renal cysts (no follow-up imaging recommended). Symmetric renal enhancement and early contrast excretion. Decompressed ureters. Mild to moderate bladder distension. Stomach/Bowel: Redundant large bowel with extensive descending and sigmoid diverticulosis. Scattered free fluid/ascites in the abdomen. No definite bowel wall thickening. No dilated small bowel. Evidence of a normal appendix on coronal image 67.  Decompressed stomach and duodenum. No free air identified. Vascular/Lymphatic: Extensive Aortoiliac calcified atherosclerosis. Status post bilateral iliac artery bypass, stable. Major arterial structures remain patent. On the delayed images the portal venous system appears to be patent. No lymphadenopathy identified. Reproductive: New ascites fluid containing left inguinal hernia since 2019. Other: Small volume simple fluid density ascites in the pelvis, similar to that in 2019. Musculoskeletal: Widespread chronic lumbar disc and endplate degeneration with degenerative lumbar scoliosis. No acute lumbar, sacral, pelvic, or proximal femur fracture identified. Partial bilateral SI joint ankylosis. Generalized abdominal and pelvic body wall edema/anasarca is new since 2019. IMPRESSION: 1. Acute minimally displaced fractures of the right anterior 5th and 6th ribs. 2. No pneumothorax, pulmonary contusion, or other acute traumatic injury identified in the chest, abdomen, or pelvis. 3. Chronic Cirrhosis and Cardiomegaly with Anasarca, increased Ascites from prior CTs, and Large layering pleural effusions with compressive atelectasis. 4.  Aortic Atherosclerosis (ICD10-I70.0). Electronically Signed   By: HGenevie AnnM.D.   On: 04/10/2022 10:11   CT CERVICAL SPINE WO CONTRAST  Result Date: 04/10/2022 CLINICAL DATA:  89year old male found down at 0730 hours today. Atrial fibrillation, on warfarin. EXAM: CT CERVICAL SPINE WITHOUT CONTRAST TECHNIQUE: Multidetector CT imaging of the cervical spine was performed without intravenous contrast. Multiplanar CT image reconstructions were also generated. RADIATION DOSE REDUCTION: This exam was  performed according to the departmental dose-optimization program which includes automated exposure control, adjustment of the mA and/or kV according to patient size and/or use of iterative reconstruction technique. COMPARISON:  Head CT today.  Cervical spine CT 04/27/2021. FINDINGS: Alignment:  Stable. Straightening of cervical lordosis with chronic degenerative appearing anterolisthesis of C2 on C3 and C7 on T1. Bilateral posterior element alignment is within normal limits. Skull base and vertebrae: Mild motion artifact. Visualized skull base is intact. No atlanto-occipital dissociation. C1 and C2 appear intact and aligned. No acute osseous abnormality identified. Soft tissues and spinal canal: No prevertebral fluid or swelling. No visible canal hematoma. Severe chronic right cervical calcified atherosclerosis. Bulky contralateral left carotid calcified atherosclerosis. Stable visible noncontrast neck soft tissues. Disc levels: Stable chronic cervical spine degeneration. Up to mild suspected cervical spinal stenosis. Upper chest: Osteopenia. Visible upper thoracic levels appear intact. Moderate to large new layering pleural effusions since last year visible in the lung apices. Simple fluid density suggesting transudate. IMPRESSION: 1. No acute traumatic injury identified in the cervical spine. Stable chronic cervical spine degeneration. 2. Moderate to large layering pleural effusions visible in the lung apices and new from cervical spine CT last year. 3. Chronic severe calcified carotid atherosclerosis worse on the right. Electronically Signed   By: Genevie Ann M.D.   On: 04/10/2022 09:56   CT HEAD WO CONTRAST  Result Date: 04/10/2022 CLINICAL DATA:  89 year old male found down at 0730 hours today. Atrial fibrillation, on warfarin. EXAM: CT HEAD WITHOUT CONTRAST TECHNIQUE: Contiguous axial images were obtained from the base of the skull through the vertex without intravenous contrast. RADIATION DOSE REDUCTION: This exam was performed according to the departmental dose-optimization program which includes automated exposure control, adjustment of the mA and/or kV according to patient size and/or use of iterative reconstruction technique. COMPARISON:  Head CT 04/27/2021. FINDINGS: Brain: Stable cerebral  volume since last year. No midline shift, ventriculomegaly, mass effect, evidence of mass lesion, intracranial hemorrhage or evidence of cortically based acute infarction. Stable small chronic appearing cerebellar infarcts. Patchy mild to moderate for age cerebral white matter hypodensity does not appear significantly changed. Vascular: Calcified atherosclerosis at the skull base. No suspicious intracranial vascular hyperdensity. Skull: Motion artifact at the skull base. No acute osseous abnormality identified. Sinuses/Orbits: Visualized paranasal sinuses and mastoids are stable and well aerated. Other: Calcified scalp vessel atherosclerosis. No orbit or scalp soft tissue injury identified. IMPRESSION: 1. Mild motion artifact. No acute intracranial abnormality or acute traumatic injury identified. 2. Stable non contrast CT appearance of chronic small vessel disease. Electronically Signed   By: Genevie Ann M.D.   On: 04/10/2022 09:53    Microbiology: No results found for this or any previous visit (from the past 240 hour(s)).   Labs: Basic Metabolic Panel: Recent Labs  Lab 04/10/22 0858 04/10/22 0906 04/10/22 1333 04/11/22 0039 04/12/22 0522 04/13/22 0420 04/14/22 0335  NA 138 140 139 138 139 138 136  K 4.5 4.5 4.5 4.7 4.0 4.0 3.7  CL 103 105  --  104 105 101 98  CO2 25  --   --  '30 25 28 30  '$ GLUCOSE 93 87  --  111* 87 92 94  BUN 43* 47*  --  38* 32* 33* 34*  CREATININE 1.31* 1.30*  --  1.24 1.00 1.13 1.10  CALCIUM 9.2  --   --  8.8* 8.7* 8.8* 8.7*   Liver Function Tests: Recent Labs  Lab 04/10/22 0858  AST 25  ALT 13  ALKPHOS 98  BILITOT 1.5*  PROT 6.8  ALBUMIN 3.2*   No results for input(s): "LIPASE", "AMYLASE" in the last 168 hours. Recent Labs  Lab 04/10/22 2110  AMMONIA 19   CBC: Recent Labs  Lab 04/10/22 0858 04/10/22 0906 04/10/22 1333 04/12/22 0522 04/13/22 0420  WBC 4.5  --   --  4.9 4.9  HGB 13.0 13.6 12.2* 11.6* 11.4*  HCT 39.9 40.0 36.0* 34.8* 33.6*  MCV  92.6  --   --  90.6 90.3  PLT 88*  --   --  87* 93*   Cardiac Enzymes: Recent Labs  Lab 04/10/22 0858  CKTOTAL 52   BNP: BNP (last 3 results) Recent Labs    11/21/21 0658 12/22/21 1218 04/10/22 0858  BNP 1,254.9* 1,164.9* 1,283.4*    ProBNP (last 3 results) No results for input(s): "PROBNP" in the last 8760 hours.  CBG: No results for input(s): "GLUCAP" in the last 168 hours.     Signed:  Domenic Polite MD.  [facility] 04/14/2022, 11:18 AM

## 2022-04-15 NOTE — Progress Notes (Signed)
      Chart reviewed. Note patient is discharging today back to [facility] today.   I had  left a packet at bedside with hard choices book, MOST form, and a list of hospice and palliative agencies. I spoke with daughter/Margaret by phone and confirmed that family has received the packet, and plans to review the information in the next few days.     Elie Confer, NP-C Palliative Medicine   Please call Palliative Medicine team phone with any questions [phone]. For individual providers please see AMION.   No charge

## 2022-04-18 DIAGNOSIS — D631 Anemia in chronic kidney disease: Secondary | ICD-10-CM | POA: Diagnosis not present

## 2022-04-18 DIAGNOSIS — I5042 Chronic combined systolic (congestive) and diastolic (congestive) heart failure: Secondary | ICD-10-CM | POA: Diagnosis not present

## 2022-04-18 DIAGNOSIS — I872 Venous insufficiency (chronic) (peripheral): Secondary | ICD-10-CM | POA: Diagnosis not present

## 2022-04-18 DIAGNOSIS — L97821 Non-pressure chronic ulcer of other part of left lower leg limited to breakdown of skin: Secondary | ICD-10-CM | POA: Diagnosis not present

## 2022-04-18 DIAGNOSIS — I130 Hypertensive heart and chronic kidney disease with heart failure and stage 1 through stage 4 chronic kidney disease, or unspecified chronic kidney disease: Secondary | ICD-10-CM | POA: Diagnosis not present

## 2022-04-18 DIAGNOSIS — N1831 Chronic kidney disease, stage 3a: Secondary | ICD-10-CM | POA: Diagnosis not present

## 2022-04-19 ENCOUNTER — Encounter: Payer: Self-pay | Admitting: Cardiovascular Disease

## 2022-04-19 DIAGNOSIS — Z85828 Personal history of other malignant neoplasm of skin: Secondary | ICD-10-CM | POA: Diagnosis not present

## 2022-04-19 DIAGNOSIS — L98499 Non-pressure chronic ulcer of skin of other sites with unspecified severity: Secondary | ICD-10-CM | POA: Diagnosis not present

## 2022-04-19 DIAGNOSIS — L97821 Non-pressure chronic ulcer of other part of left lower leg limited to breakdown of skin: Secondary | ICD-10-CM | POA: Diagnosis not present

## 2022-04-23 DIAGNOSIS — Z743 Need for continuous supervision: Secondary | ICD-10-CM | POA: Diagnosis not present

## 2022-04-23 DIAGNOSIS — R609 Edema, unspecified: Secondary | ICD-10-CM | POA: Diagnosis not present

## 2022-04-24 ENCOUNTER — Emergency Department (HOSPITAL_COMMUNITY): Payer: Medicare Other

## 2022-04-24 ENCOUNTER — Encounter (HOSPITAL_COMMUNITY): Payer: Self-pay

## 2022-04-24 ENCOUNTER — Emergency Department (HOSPITAL_COMMUNITY)
Admission: EM | Admit: 2022-04-24 | Discharge: 2022-04-24 | Disposition: A | Discharge status: Home / Self Care | Payer: Medicare Other | Attending: Emergency Medicine | Admitting: Emergency Medicine

## 2022-04-24 ENCOUNTER — Other Ambulatory Visit: Payer: Self-pay

## 2022-04-24 DIAGNOSIS — I4891 Unspecified atrial fibrillation: Secondary | ICD-10-CM | POA: Insufficient documentation

## 2022-04-24 DIAGNOSIS — J90 Pleural effusion, not elsewhere classified: Secondary | ICD-10-CM | POA: Diagnosis not present

## 2022-04-24 DIAGNOSIS — X58XXXA Exposure to other specified factors, initial encounter: Secondary | ICD-10-CM | POA: Insufficient documentation

## 2022-04-24 DIAGNOSIS — S81802A Unspecified open wound, left lower leg, initial encounter: Secondary | ICD-10-CM | POA: Insufficient documentation

## 2022-04-24 DIAGNOSIS — R531 Weakness: Secondary | ICD-10-CM | POA: Diagnosis not present

## 2022-04-24 DIAGNOSIS — J811 Chronic pulmonary edema: Secondary | ICD-10-CM | POA: Diagnosis not present

## 2022-04-24 DIAGNOSIS — Z7401 Bed confinement status: Secondary | ICD-10-CM | POA: Diagnosis not present

## 2022-04-24 DIAGNOSIS — L309 Dermatitis, unspecified: Secondary | ICD-10-CM | POA: Diagnosis not present

## 2022-04-24 DIAGNOSIS — I509 Heart failure, unspecified: Secondary | ICD-10-CM | POA: Insufficient documentation

## 2022-04-24 DIAGNOSIS — I110 Hypertensive heart disease with heart failure: Secondary | ICD-10-CM | POA: Diagnosis not present

## 2022-04-24 DIAGNOSIS — M7989 Other specified soft tissue disorders: Secondary | ICD-10-CM | POA: Diagnosis not present

## 2022-04-24 LAB — COMPREHENSIVE METABOLIC PANEL
ALT: 18 U/L (ref 0–44)
AST: 25 U/L (ref 15–41)
Albumin: 3.1 g/dL — ABNORMAL LOW (ref 3.5–5.0)
Alkaline Phosphatase: 103 U/L (ref 38–126)
Anion gap: 8 (ref 5–15)
BUN: 41 mg/dL — ABNORMAL HIGH (ref 8–23)
CO2: 24 mmol/L (ref 22–32)
Calcium: 9.4 mg/dL (ref 8.9–10.3)
Chloride: 107 mmol/L (ref 98–111)
Creatinine, Ser: 1.3 mg/dL — ABNORMAL HIGH (ref 0.61–1.24)
GFR, Estimated: 51 mL/min — ABNORMAL LOW (ref >60)
Glucose, Bld: 104 mg/dL — ABNORMAL HIGH (ref 70–99)
Potassium: 4.7 mmol/L (ref 3.5–5.1)
Sodium: 139 mmol/L (ref 135–145)
Total Bilirubin: 1.5 mg/dL — ABNORMAL HIGH (ref 0.3–1.2)
Total Protein: 6.9 g/dL (ref 6.5–8.1)

## 2022-04-24 LAB — LACTIC ACID, PLASMA: Lactic Acid, Venous: 1.4 mmol/L (ref 0.5–1.9)

## 2022-04-24 LAB — CBC WITH DIFFERENTIAL/PLATELET
Abs Immature Granulocytes: 0.01 10*3/uL (ref 0.00–0.07)
Basophils Absolute: 0.1 10*3/uL (ref 0.0–0.1)
Basophils Relative: 1 %
Eosinophils Absolute: 0.1 10*3/uL (ref 0.0–0.5)
Eosinophils Relative: 1 %
HCT: 37.1 % — ABNORMAL LOW (ref 39.0–52.0)
Hemoglobin: 11.8 g/dL — ABNORMAL LOW (ref 13.0–17.0)
Immature Granulocytes: 0 %
Lymphocytes Relative: 15 %
Lymphs Abs: 0.7 10*3/uL (ref 0.7–4.0)
MCH: 29.9 pg (ref 26.0–34.0)
MCHC: 31.8 g/dL (ref 30.0–36.0)
MCV: 94.2 fL (ref 80.0–100.0)
Monocytes Absolute: 0.5 10*3/uL (ref 0.1–1.0)
Monocytes Relative: 10 %
Neutro Abs: 3.5 10*3/uL (ref 1.7–7.7)
Neutrophils Relative %: 73 %
Platelets: 133 10*3/uL — ABNORMAL LOW (ref 150–400)
RBC: 3.94 MIL/uL — ABNORMAL LOW (ref 4.22–5.81)
RDW: 17.2 % — ABNORMAL HIGH (ref 11.5–15.5)
WBC: 4.7 10*3/uL (ref 4.0–10.5)
nRBC: 0 % (ref 0.0–0.2)

## 2022-04-24 LAB — BRAIN NATRIURETIC PEPTIDE: B Natriuretic Peptide: 1680.2 pg/mL — ABNORMAL HIGH (ref 0.0–100.0)

## 2022-04-24 MED ORDER — FUROSEMIDE 10 MG/ML IJ SOLN
80.0000 mg | Freq: Once | INTRAMUSCULAR | Status: AC
  Administered 2022-04-24: 80 mg via INTRAVENOUS
  Filled 2022-04-24: qty 8

## 2022-04-24 MED ORDER — FUROSEMIDE 40 MG PO TABS: 60.0000 mg | ORAL_TABLET | Freq: Two times a day (BID) | ORAL | 0 refills | Status: DC

## 2022-04-24 NOTE — ED Notes (Signed)
Patient given water. Drank without difficulty.

## 2022-04-24 NOTE — ED Notes (Signed)
[facility] called to transfer patient to [facility].

## 2022-04-24 NOTE — ED Triage Notes (Signed)
BIB EMS from [facility] for bilateral leg swelling and weeping. Left hand swelling.  Hx CHF, AFIB 136/64; 60 HR; 98% RA; 116 CBG.

## 2022-04-24 NOTE — ED Notes (Signed)
Patients wound dressed with nonadherent bandage and gauze on left lower leg.

## 2022-04-24 NOTE — Discharge Instructions (Addendum)
The drainage from the leg is because of the increased swelling.  The fluid is simply coming out of the chronic opening.  There is no sign of infection.  Increased swelling of the legs is because of the history of heart failure.  Will increase Lasix.  Take Lasix twice a day for the next 3 days, elevate the legs as much as possible.

## 2022-04-24 NOTE — ED Provider Notes (Signed)
[facility] Provider Note   CSN: [id number] Arrival date & time: 04/24/22  0021     History  Chief Complaint  Patient presents with   Leg Swelling    Tyler Williams is a 89 y.o. male.  Patient presents to the emergency department for evaluation of leg swelling.  Patient with history of atrial fibrillation and congestive heart failure.  He apparently has a history of chronic leg swelling with a wound on the left shin.  The wound has been cared for by wound care nurse once a week.  Apparently the area has started weeping clear fluid and his legs are more swollen than usual.  Patient denies chest pain, shortness of breath.  No leg pain.  She does, however, have some swelling of his left hand and there is bruising.  He is unaware of any injury.  He reports slight pain when he makes a fist, otherwise no complaints.        Home Medications Prior to Admission medications   Medication Sig Start Date End Date Taking? Authorizing Provider  furosemide (LASIX) 40 MG tablet Take 1.5 tablets (60 mg total) by mouth 2 (two) times daily for 3 days. After 3 days, go back to normal once a day dosing 04/24/22 04/27/22 Yes Gen Clagg, Gwenyth Allegra, MD  acetaminophen (TYLENOL) 500 MG tablet Take 500 mg by mouth every 4 (four) hours as needed for moderate pain.    [provider]  atorvastatin (LIPITOR) 20 MG tablet TAKE 1 TABLET ONCE DAILY. 12/30/20   Sherren Mocha, MD  Calcium-Magnesium-Zinc (CAL-MAG-ZINC PO) Take 1 tablet by mouth every morning.    [provider]  Cholecalciferol (VITAMIN D-3) 125 MCG (5000 UT) TABS Take 5,000 Units by mouth every morning.    [provider]  fesoterodine (TOVIAZ) 8 MG TB24 tablet Take 8 mg by mouth daily.    [provider]  finasteride (PROSCAR) 5 MG tablet Take 1 tablet (5 mg total) by mouth daily. 06/21/20   Charlynne Cousins, MD  furosemide (LASIX) 40 MG tablet Take 1.5 tablets  (60 mg total) by mouth daily. 12/05/21   [name] T, PA-C  isosorbide mononitrate (IMDUR) 30 MG 24 hr tablet Take 1 tablet (30 mg total) by mouth daily. 04/14/22   Domenic Polite, MD  lactulose (CHRONULAC) 10 GM/15ML solution Take 30 mLs (20 g total) by mouth daily. 04/14/22   Domenic Polite, MD  LORazepam (ATIVAN) 1 MG tablet Take 1 mg by mouth 2 (two) times daily as needed for anxiety.    [provider]  metoprolol succinate (TOPROL XL) 25 MG 24 hr tablet Take 0.5 tablets (12.5 mg total) by mouth daily. 10/05/21   [name] T, PA-C  Multiple Vitamins-Minerals (PRESERVISION AREDS) TABS Take 1 tablet by mouth daily. 03/04/09   [provider]  nitroGLYCERIN (NITROSTAT) 0.4 MG SL tablet Place 0.4 mg under the tongue every 5 (five) minutes as needed for chest pain.    [provider]  nystatin cream (MYCOSTATIN) Apply 1 Application topically 2 (two) times daily. 11/28/21   [provider]  polyethylene glycol (MIRALAX / GLYCOLAX) 17 g packet Take 17 g by mouth daily as needed. 12/26/21   Lorella Nimrod, MD  Polyethylene Glycol 3350 POWD Take 17 g by mouth daily.    [provider]  sacubitril-valsartan (ENTRESTO) 24-26 MG Take 1 tablet by mouth 2 (two) times daily. Please keep upcoming appointment for future refills thank you. 07/04/21  Sherren Mocha, MD  spironolactone (ALDACTONE) 25 MG tablet Take 1 tablet (25 mg total) by mouth daily. 10/05/21   [name] T, PA-C  tamsulosin (FLOMAX) 0.4 MG CAPS capsule Take 1 capsule (0.4 mg total) by mouth daily. 06/21/20   Charlynne Cousins, MD  warfarin (COUMADIN) 2.5 MG tablet Take 2.5-5 mg by mouth See admin instructions. Take 2.5 mg by mouth on Sunday, Tuesday, Thursday and Saturday. Take '5mg'$  by mouth on Monday, Wednesday and Friday. 11/30/21   [provider]      Allergies    Patient has no known allergies.    Review of Systems   Review of Systems  Physical Exam Updated Vital Signs BP  (!) 140/62   Pulse (!) 55   Temp 97.6 F (36.4 C) (Oral)   Resp 16   Ht '5\' 8"'$  (1.727 m)   Wt 79.4 kg   SpO2 96%   BMI 26.61 kg/m  Physical Exam Vitals and nursing note reviewed.  Constitutional:      General: He is not in acute distress.    Appearance: He is well-developed.  HENT:     Head: Normocephalic and atraumatic.     Mouth/Throat:     Mouth: Mucous membranes are moist.  Eyes:     General: Vision grossly intact. Gaze aligned appropriately.     Extraocular Movements: Extraocular movements intact.     Conjunctiva/sclera: Conjunctivae normal.  Cardiovascular:     Rate and Rhythm: Normal rate and regular rhythm.     Pulses: Normal pulses.     Heart sounds: Normal heart sounds, S1 normal and S2 normal. No murmur heard.    No friction rub. No gallop.  Pulmonary:     Effort: Pulmonary effort is normal. No respiratory distress.     Breath sounds: Normal breath sounds.  Abdominal:     Palpations: Abdomen is soft.     Tenderness: There is no abdominal tenderness. There is no guarding or rebound.     Hernia: No hernia is present.  Musculoskeletal:        General: No swelling.     Left hand: Swelling and tenderness present. No deformity. Normal range of motion.     Cervical back: Full passive range of motion without pain, normal range of motion and neck supple. No pain with movement, spinous process tenderness or muscular tenderness. Normal range of motion.     Right lower leg: No edema.     Left lower leg: No edema.  Skin:    General: Skin is warm and dry.     Capillary Refill: Capillary refill takes less than 2 seconds.     Findings: Rash (chronic dermatitis bliateral lower legs) and wound (tiny open area left shin, draining serosanguineous fluid, no erythema, induration, or purulent drainage) present. No ecchymosis, erythema or lesion.  Neurological:     Mental Status: He is alert and oriented to person, place, and time.     GCS: GCS eye subscore is 4. GCS verbal subscore  is 5. GCS motor subscore is 6.     Cranial Nerves: Cranial nerves 2-12 are intact.     Sensory: Sensation is intact.     Motor: Motor function is intact. No weakness or abnormal muscle tone.     Coordination: Coordination is intact.  Psychiatric:        Mood and Affect: Mood normal.        Speech: Speech normal.        Behavior: Behavior normal.  ED Results / Procedures / Treatments   Labs (all labs ordered are listed, but only abnormal results are displayed) Labs Reviewed  CBC WITH DIFFERENTIAL/PLATELET - Abnormal; Notable for the following components:      Result Value   RBC 3.94 (*)    Hemoglobin 11.8 (*)    HCT 37.1 (*)    RDW 17.2 (*)    Platelets 133 (*)    All other components within normal limits  COMPREHENSIVE METABOLIC PANEL - Abnormal; Notable for the following components:   Glucose, Bld 104 (*)    BUN 41 (*)    Creatinine, Ser 1.30 (*)    Albumin 3.1 (*)    Total Bilirubin 1.5 (*)    GFR, Estimated 51 (*)    All other components within normal limits  BRAIN NATRIURETIC PEPTIDE - Abnormal; Notable for the following components:   B Natriuretic Peptide 1,680.2 (*)    All other components within normal limits  LACTIC ACID, PLASMA    EKG EKG Interpretation  Date/Time:  Monday April 24 2022 00:39:34 EDT Ventricular Rate:  69 PR Interval:    QRS Duration: 159 QT Interval:  426 QTC Calculation: 457 R Axis:   123 Text Interpretation: Atrial fibrillation Right bundle branch block No significant change since last tracing Confirmed by Orpah Greek [phone]) on 04/24/2022 1:19:42 AM  Radiology DG Hand Complete Left  Result Date: 04/24/2022 CLINICAL DATA:  pain, swelling EXAM: LEFT HAND - COMPLETE 3+ VIEW COMPARISON:  None Available. FINDINGS: There is no evidence of fracture or dislocation. First through third digit metacarpophalangeal joint degenerative changes. Distal interphalangeal joint degenerative changes. Soft tissues are unremarkable. Vascular  calcifications. IMPRESSION: No acute displaced fracture or dislocation. Electronically Signed   By: Iven Finn M.D.   On: 04/24/2022 01:55   DG Chest 2 View  Result Date: 04/24/2022 CLINICAL DATA:  Bilateral leg swelling EXAM: CHEST - 2 VIEW COMPARISON:  Radiographs 04/11/2022 FINDINGS: Cardiomegaly. TAVR. Aortic atherosclerotic calcification. Pulmonary vascular congestion. Mild bilateral interstitial opacities. Left basilar atelectasis/consolidation. Small left greater than right pleural effusions. No pneumothorax. IMPRESSION: Left basilar atelectasis or pneumonia. Cardiomegaly and interstitial edema and small pleural effusions. Electronically Signed   By: Placido Sou M.D.   On: 04/24/2022 01:50    Procedures Procedures    Medications Ordered in ED Medications  furosemide (LASIX) injection 80 mg (80 mg Intravenous Given 04/24/22 0218)    ED Course/ Medical Decision Making/ A&P                             Medical Decision Making Amount and/or Complexity of Data Reviewed Labs: ordered. Radiology: ordered.  Risk Prescription drug management.   Differential diagnosis considered includes, but not limited to: Edema, congestive heart failure, DVT, cellulitis  Presents with complaints of increased swelling of both of his legs, now with drainage from a chronic wound on the left shin.  Examination reveals chronic changes of the skin of the legs but no acute erythema, induration or signs of acute infection.  The area of drainage is a very small opening in the skin that appears chronic without any signs of superinfection.  Drainage is serosanguineous, secondary to the increased swelling of the legs.  Workup does suggest mild CHF exacerbation.  He is not in any respiratory distress.  No shortness of breath.  Not hypoxic.  No associated chest pain.  Patient given IV Lasix here and has diuresed.  He will be appropriate  for continued outpatient treatment of CHF with increased diuretics and  close follow-up.        Final Clinical Impression(s) / ED Diagnoses Final diagnoses:  Acute on chronic congestive heart failure, unspecified heart failure type ([place])    Rx / DC Orders ED Discharge Orders          Ordered    furosemide (LASIX) 40 MG tablet  2 times daily        04/24/22 0536              Orpah Greek, MD 04/24/22 [phone]

## 2022-04-24 NOTE — ED Notes (Signed)
Patient wet himself, patient cleaned up and linens changed pt placed on male purwick with tech without difficulty. Patient comfortable. No acute concerns, bed in lowest position, call light within reach.

## 2022-04-24 NOTE — ED Notes (Signed)
Patient resting in bed with eyes closed. Bed in lowest position. Call light within reach. Urinal at bedside.

## 2022-04-26 ENCOUNTER — Ambulatory Visit: Payer: Medicare Other | Admitting: Podiatry

## 2022-04-28 DIAGNOSIS — N1831 Chronic kidney disease, stage 3a: Secondary | ICD-10-CM | POA: Diagnosis not present

## 2022-04-28 DIAGNOSIS — I272 Pulmonary hypertension, unspecified: Secondary | ICD-10-CM | POA: Diagnosis not present

## 2022-04-28 DIAGNOSIS — E785 Hyperlipidemia, unspecified: Secondary | ICD-10-CM | POA: Diagnosis not present

## 2022-04-28 DIAGNOSIS — N401 Enlarged prostate with lower urinary tract symptoms: Secondary | ICD-10-CM | POA: Diagnosis not present

## 2022-04-28 DIAGNOSIS — I2510 Atherosclerotic heart disease of native coronary artery without angina pectoris: Secondary | ICD-10-CM | POA: Diagnosis not present

## 2022-04-28 DIAGNOSIS — H3530 Unspecified macular degeneration: Secondary | ICD-10-CM | POA: Diagnosis not present

## 2022-04-28 DIAGNOSIS — I5043 Acute on chronic combined systolic (congestive) and diastolic (congestive) heart failure: Secondary | ICD-10-CM | POA: Diagnosis not present

## 2022-04-28 DIAGNOSIS — I130 Hypertensive heart and chronic kidney disease with heart failure and stage 1 through stage 4 chronic kidney disease, or unspecified chronic kidney disease: Secondary | ICD-10-CM | POA: Diagnosis not present

## 2022-04-28 DIAGNOSIS — I4821 Permanent atrial fibrillation: Secondary | ICD-10-CM | POA: Diagnosis not present

## 2022-04-28 DIAGNOSIS — I420 Dilated cardiomyopathy: Secondary | ICD-10-CM | POA: Diagnosis not present

## 2022-04-28 DIAGNOSIS — N39498 Other specified urinary incontinence: Secondary | ICD-10-CM | POA: Diagnosis not present

## 2022-04-28 DIAGNOSIS — I495 Sick sinus syndrome: Secondary | ICD-10-CM | POA: Diagnosis not present

## 2022-04-28 DIAGNOSIS — L97821 Non-pressure chronic ulcer of other part of left lower leg limited to breakdown of skin: Secondary | ICD-10-CM | POA: Diagnosis not present

## 2022-04-28 DIAGNOSIS — D696 Thrombocytopenia, unspecified: Secondary | ICD-10-CM | POA: Diagnosis not present

## 2022-04-28 DIAGNOSIS — K7460 Unspecified cirrhosis of liver: Secondary | ICD-10-CM | POA: Diagnosis not present

## 2022-04-28 DIAGNOSIS — I872 Venous insufficiency (chronic) (peripheral): Secondary | ICD-10-CM | POA: Diagnosis not present

## 2022-04-28 DIAGNOSIS — F419 Anxiety disorder, unspecified: Secondary | ICD-10-CM | POA: Diagnosis not present

## 2022-04-28 DIAGNOSIS — K7682 Hepatic encephalopathy: Secondary | ICD-10-CM | POA: Diagnosis not present

## 2022-04-28 DIAGNOSIS — H9190 Unspecified hearing loss, unspecified ear: Secondary | ICD-10-CM | POA: Diagnosis not present

## 2022-04-28 DIAGNOSIS — I739 Peripheral vascular disease, unspecified: Secondary | ICD-10-CM | POA: Diagnosis not present

## 2022-04-28 DIAGNOSIS — D631 Anemia in chronic kidney disease: Secondary | ICD-10-CM | POA: Diagnosis not present

## 2022-04-28 DIAGNOSIS — I4510 Unspecified right bundle-branch block: Secondary | ICD-10-CM | POA: Diagnosis not present

## 2022-04-28 DIAGNOSIS — M1990 Unspecified osteoarthritis, unspecified site: Secondary | ICD-10-CM | POA: Diagnosis not present

## 2022-04-28 DIAGNOSIS — K219 Gastro-esophageal reflux disease without esophagitis: Secondary | ICD-10-CM | POA: Diagnosis not present

## 2022-04-28 DIAGNOSIS — S2241XD Multiple fractures of ribs, right side, subsequent encounter for fracture with routine healing: Secondary | ICD-10-CM | POA: Diagnosis not present

## 2022-05-01 ENCOUNTER — Telehealth: Payer: Self-pay

## 2022-05-01 NOTE — Telephone Encounter (Signed)
        Patient  visited [place] on 3/11    Telephone encounter attempt :  1st  A HIPAA compliant voice message was left requesting a return call.  Instructed patient to call back    [facility] [phone] [address], [place], [place] [address] Phone: [phone] Email: [email or web address]

## 2022-05-02 ENCOUNTER — Telehealth: Payer: Self-pay

## 2022-05-02 DIAGNOSIS — D631 Anemia in chronic kidney disease: Secondary | ICD-10-CM | POA: Diagnosis not present

## 2022-05-02 DIAGNOSIS — N1831 Chronic kidney disease, stage 3a: Secondary | ICD-10-CM | POA: Diagnosis not present

## 2022-05-02 DIAGNOSIS — I130 Hypertensive heart and chronic kidney disease with heart failure and stage 1 through stage 4 chronic kidney disease, or unspecified chronic kidney disease: Secondary | ICD-10-CM | POA: Diagnosis not present

## 2022-05-02 DIAGNOSIS — I5043 Acute on chronic combined systolic (congestive) and diastolic (congestive) heart failure: Secondary | ICD-10-CM | POA: Diagnosis not present

## 2022-05-02 DIAGNOSIS — S2241XD Multiple fractures of ribs, right side, subsequent encounter for fracture with routine healing: Secondary | ICD-10-CM | POA: Diagnosis not present

## 2022-05-02 DIAGNOSIS — K7682 Hepatic encephalopathy: Secondary | ICD-10-CM | POA: Diagnosis not present

## 2022-05-02 NOTE — Telephone Encounter (Signed)
        Patient  visited [place] on 3/11     Telephone encounter attempt :   2nd  A HIPAA compliant voice message was left requesting a return call.  Instructed patient to call back .    [facility] [phone] [address], [place], [place] [address] Phone: [phone] Email: [email or web address]

## 2022-05-08 DIAGNOSIS — I130 Hypertensive heart and chronic kidney disease with heart failure and stage 1 through stage 4 chronic kidney disease, or unspecified chronic kidney disease: Secondary | ICD-10-CM | POA: Diagnosis not present

## 2022-05-08 DIAGNOSIS — S2241XD Multiple fractures of ribs, right side, subsequent encounter for fracture with routine healing: Secondary | ICD-10-CM | POA: Diagnosis not present

## 2022-05-08 DIAGNOSIS — N1831 Chronic kidney disease, stage 3a: Secondary | ICD-10-CM | POA: Diagnosis not present

## 2022-05-08 DIAGNOSIS — D631 Anemia in chronic kidney disease: Secondary | ICD-10-CM | POA: Diagnosis not present

## 2022-05-08 DIAGNOSIS — I5043 Acute on chronic combined systolic (congestive) and diastolic (congestive) heart failure: Secondary | ICD-10-CM | POA: Diagnosis not present

## 2022-05-08 DIAGNOSIS — K7682 Hepatic encephalopathy: Secondary | ICD-10-CM | POA: Diagnosis not present

## 2022-05-09 DIAGNOSIS — K7682 Hepatic encephalopathy: Secondary | ICD-10-CM | POA: Diagnosis not present

## 2022-05-09 DIAGNOSIS — D631 Anemia in chronic kidney disease: Secondary | ICD-10-CM | POA: Diagnosis not present

## 2022-05-09 DIAGNOSIS — S2241XD Multiple fractures of ribs, right side, subsequent encounter for fracture with routine healing: Secondary | ICD-10-CM | POA: Diagnosis not present

## 2022-05-09 DIAGNOSIS — I130 Hypertensive heart and chronic kidney disease with heart failure and stage 1 through stage 4 chronic kidney disease, or unspecified chronic kidney disease: Secondary | ICD-10-CM | POA: Diagnosis not present

## 2022-05-09 DIAGNOSIS — I5043 Acute on chronic combined systolic (congestive) and diastolic (congestive) heart failure: Secondary | ICD-10-CM | POA: Diagnosis not present

## 2022-05-09 DIAGNOSIS — N1831 Chronic kidney disease, stage 3a: Secondary | ICD-10-CM | POA: Diagnosis not present

## 2022-05-15 DIAGNOSIS — I130 Hypertensive heart and chronic kidney disease with heart failure and stage 1 through stage 4 chronic kidney disease, or unspecified chronic kidney disease: Secondary | ICD-10-CM | POA: Diagnosis not present

## 2022-05-15 DIAGNOSIS — K7682 Hepatic encephalopathy: Secondary | ICD-10-CM | POA: Diagnosis not present

## 2022-05-15 DIAGNOSIS — D631 Anemia in chronic kidney disease: Secondary | ICD-10-CM | POA: Diagnosis not present

## 2022-05-15 DIAGNOSIS — N1831 Chronic kidney disease, stage 3a: Secondary | ICD-10-CM | POA: Diagnosis not present

## 2022-05-15 DIAGNOSIS — S2241XD Multiple fractures of ribs, right side, subsequent encounter for fracture with routine healing: Secondary | ICD-10-CM | POA: Diagnosis not present

## 2022-05-15 DIAGNOSIS — I5043 Acute on chronic combined systolic (congestive) and diastolic (congestive) heart failure: Secondary | ICD-10-CM | POA: Diagnosis not present

## 2022-05-16 DIAGNOSIS — I5043 Acute on chronic combined systolic (congestive) and diastolic (congestive) heart failure: Secondary | ICD-10-CM | POA: Diagnosis not present

## 2022-05-16 DIAGNOSIS — I130 Hypertensive heart and chronic kidney disease with heart failure and stage 1 through stage 4 chronic kidney disease, or unspecified chronic kidney disease: Secondary | ICD-10-CM | POA: Diagnosis not present

## 2022-05-16 DIAGNOSIS — K7682 Hepatic encephalopathy: Secondary | ICD-10-CM | POA: Diagnosis not present

## 2022-05-16 DIAGNOSIS — N1831 Chronic kidney disease, stage 3a: Secondary | ICD-10-CM | POA: Diagnosis not present

## 2022-05-16 DIAGNOSIS — D631 Anemia in chronic kidney disease: Secondary | ICD-10-CM | POA: Diagnosis not present

## 2022-05-16 DIAGNOSIS — S2241XD Multiple fractures of ribs, right side, subsequent encounter for fracture with routine healing: Secondary | ICD-10-CM | POA: Diagnosis not present

## 2022-05-22 DIAGNOSIS — I130 Hypertensive heart and chronic kidney disease with heart failure and stage 1 through stage 4 chronic kidney disease, or unspecified chronic kidney disease: Secondary | ICD-10-CM | POA: Diagnosis not present

## 2022-05-22 DIAGNOSIS — S2241XD Multiple fractures of ribs, right side, subsequent encounter for fracture with routine healing: Secondary | ICD-10-CM | POA: Diagnosis not present

## 2022-05-22 DIAGNOSIS — N1831 Chronic kidney disease, stage 3a: Secondary | ICD-10-CM | POA: Diagnosis not present

## 2022-05-22 DIAGNOSIS — I5043 Acute on chronic combined systolic (congestive) and diastolic (congestive) heart failure: Secondary | ICD-10-CM | POA: Diagnosis not present

## 2022-05-22 DIAGNOSIS — K7682 Hepatic encephalopathy: Secondary | ICD-10-CM | POA: Diagnosis not present

## 2022-05-22 DIAGNOSIS — D631 Anemia in chronic kidney disease: Secondary | ICD-10-CM | POA: Diagnosis not present

## 2022-05-25 DIAGNOSIS — I5043 Acute on chronic combined systolic (congestive) and diastolic (congestive) heart failure: Secondary | ICD-10-CM | POA: Diagnosis not present

## 2022-05-25 DIAGNOSIS — N1831 Chronic kidney disease, stage 3a: Secondary | ICD-10-CM | POA: Diagnosis not present

## 2022-05-25 DIAGNOSIS — I130 Hypertensive heart and chronic kidney disease with heart failure and stage 1 through stage 4 chronic kidney disease, or unspecified chronic kidney disease: Secondary | ICD-10-CM | POA: Diagnosis not present

## 2022-05-25 DIAGNOSIS — S2241XD Multiple fractures of ribs, right side, subsequent encounter for fracture with routine healing: Secondary | ICD-10-CM | POA: Diagnosis not present

## 2022-05-25 DIAGNOSIS — D631 Anemia in chronic kidney disease: Secondary | ICD-10-CM | POA: Diagnosis not present

## 2022-05-25 DIAGNOSIS — K7682 Hepatic encephalopathy: Secondary | ICD-10-CM | POA: Diagnosis not present

## 2022-05-30 DIAGNOSIS — N182 Chronic kidney disease, stage 2 (mild): Secondary | ICD-10-CM | POA: Diagnosis not present

## 2022-05-30 DIAGNOSIS — I509 Heart failure, unspecified: Secondary | ICD-10-CM | POA: Diagnosis not present

## 2022-05-30 DIAGNOSIS — R32 Unspecified urinary incontinence: Secondary | ICD-10-CM | POA: Diagnosis not present

## 2022-05-30 DIAGNOSIS — I4510 Unspecified right bundle-branch block: Secondary | ICD-10-CM | POA: Diagnosis not present

## 2022-05-30 DIAGNOSIS — S81802D Unspecified open wound, left lower leg, subsequent encounter: Secondary | ICD-10-CM | POA: Diagnosis not present

## 2022-05-30 DIAGNOSIS — I4821 Permanent atrial fibrillation: Secondary | ICD-10-CM | POA: Diagnosis not present

## 2022-05-30 DIAGNOSIS — H538 Other visual disturbances: Secondary | ICD-10-CM | POA: Diagnosis not present

## 2022-05-30 DIAGNOSIS — S61401D Unspecified open wound of right hand, subsequent encounter: Secondary | ICD-10-CM | POA: Diagnosis not present

## 2022-05-30 DIAGNOSIS — S2241XD Multiple fractures of ribs, right side, subsequent encounter for fracture with routine healing: Secondary | ICD-10-CM | POA: Diagnosis not present

## 2022-05-30 DIAGNOSIS — K7460 Unspecified cirrhosis of liver: Secondary | ICD-10-CM | POA: Diagnosis not present

## 2022-05-30 DIAGNOSIS — H9193 Unspecified hearing loss, bilateral: Secondary | ICD-10-CM | POA: Diagnosis not present

## 2022-05-30 DIAGNOSIS — L309 Dermatitis, unspecified: Secondary | ICD-10-CM | POA: Diagnosis not present

## 2022-05-30 DIAGNOSIS — K7682 Hepatic encephalopathy: Secondary | ICD-10-CM | POA: Diagnosis not present

## 2022-05-30 DIAGNOSIS — H3530 Unspecified macular degeneration: Secondary | ICD-10-CM | POA: Diagnosis not present

## 2022-05-30 DIAGNOSIS — Z7901 Long term (current) use of anticoagulants: Secondary | ICD-10-CM | POA: Diagnosis not present

## 2022-05-30 DIAGNOSIS — I130 Hypertensive heart and chronic kidney disease with heart failure and stage 1 through stage 4 chronic kidney disease, or unspecified chronic kidney disease: Secondary | ICD-10-CM | POA: Diagnosis not present

## 2022-06-05 DIAGNOSIS — N182 Chronic kidney disease, stage 2 (mild): Secondary | ICD-10-CM | POA: Diagnosis not present

## 2022-06-05 DIAGNOSIS — S2241XD Multiple fractures of ribs, right side, subsequent encounter for fracture with routine healing: Secondary | ICD-10-CM | POA: Diagnosis not present

## 2022-06-05 DIAGNOSIS — I130 Hypertensive heart and chronic kidney disease with heart failure and stage 1 through stage 4 chronic kidney disease, or unspecified chronic kidney disease: Secondary | ICD-10-CM | POA: Diagnosis not present

## 2022-06-05 DIAGNOSIS — S81802D Unspecified open wound, left lower leg, subsequent encounter: Secondary | ICD-10-CM | POA: Diagnosis not present

## 2022-06-05 DIAGNOSIS — I509 Heart failure, unspecified: Secondary | ICD-10-CM | POA: Diagnosis not present

## 2022-06-05 DIAGNOSIS — S61401D Unspecified open wound of right hand, subsequent encounter: Secondary | ICD-10-CM | POA: Diagnosis not present

## 2022-06-06 DIAGNOSIS — L97821 Non-pressure chronic ulcer of other part of left lower leg limited to breakdown of skin: Secondary | ICD-10-CM | POA: Diagnosis not present

## 2022-06-06 DIAGNOSIS — S61401D Unspecified open wound of right hand, subsequent encounter: Secondary | ICD-10-CM | POA: Diagnosis not present

## 2022-06-06 DIAGNOSIS — L98499 Non-pressure chronic ulcer of skin of other sites with unspecified severity: Secondary | ICD-10-CM | POA: Diagnosis not present

## 2022-06-06 DIAGNOSIS — I509 Heart failure, unspecified: Secondary | ICD-10-CM | POA: Diagnosis not present

## 2022-06-06 DIAGNOSIS — I130 Hypertensive heart and chronic kidney disease with heart failure and stage 1 through stage 4 chronic kidney disease, or unspecified chronic kidney disease: Secondary | ICD-10-CM | POA: Diagnosis not present

## 2022-06-06 DIAGNOSIS — Z85828 Personal history of other malignant neoplasm of skin: Secondary | ICD-10-CM | POA: Diagnosis not present

## 2022-06-06 DIAGNOSIS — L858 Other specified epidermal thickening: Secondary | ICD-10-CM | POA: Diagnosis not present

## 2022-06-06 DIAGNOSIS — N182 Chronic kidney disease, stage 2 (mild): Secondary | ICD-10-CM | POA: Diagnosis not present

## 2022-06-06 DIAGNOSIS — S2241XD Multiple fractures of ribs, right side, subsequent encounter for fracture with routine healing: Secondary | ICD-10-CM | POA: Diagnosis not present

## 2022-06-06 DIAGNOSIS — D0439 Carcinoma in situ of skin of other parts of face: Secondary | ICD-10-CM | POA: Diagnosis not present

## 2022-06-06 DIAGNOSIS — S81802D Unspecified open wound, left lower leg, subsequent encounter: Secondary | ICD-10-CM | POA: Diagnosis not present

## 2022-06-08 DIAGNOSIS — S81802D Unspecified open wound, left lower leg, subsequent encounter: Secondary | ICD-10-CM | POA: Diagnosis not present

## 2022-06-08 DIAGNOSIS — I130 Hypertensive heart and chronic kidney disease with heart failure and stage 1 through stage 4 chronic kidney disease, or unspecified chronic kidney disease: Secondary | ICD-10-CM | POA: Diagnosis not present

## 2022-06-08 DIAGNOSIS — S61401D Unspecified open wound of right hand, subsequent encounter: Secondary | ICD-10-CM | POA: Diagnosis not present

## 2022-06-08 DIAGNOSIS — S2241XD Multiple fractures of ribs, right side, subsequent encounter for fracture with routine healing: Secondary | ICD-10-CM | POA: Diagnosis not present

## 2022-06-08 DIAGNOSIS — N182 Chronic kidney disease, stage 2 (mild): Secondary | ICD-10-CM | POA: Diagnosis not present

## 2022-06-08 DIAGNOSIS — I509 Heart failure, unspecified: Secondary | ICD-10-CM | POA: Diagnosis not present

## 2022-06-12 DIAGNOSIS — N182 Chronic kidney disease, stage 2 (mild): Secondary | ICD-10-CM | POA: Diagnosis not present

## 2022-06-12 DIAGNOSIS — S61401D Unspecified open wound of right hand, subsequent encounter: Secondary | ICD-10-CM | POA: Diagnosis not present

## 2022-06-12 DIAGNOSIS — I509 Heart failure, unspecified: Secondary | ICD-10-CM | POA: Diagnosis not present

## 2022-06-12 DIAGNOSIS — S2241XD Multiple fractures of ribs, right side, subsequent encounter for fracture with routine healing: Secondary | ICD-10-CM | POA: Diagnosis not present

## 2022-06-12 DIAGNOSIS — I130 Hypertensive heart and chronic kidney disease with heart failure and stage 1 through stage 4 chronic kidney disease, or unspecified chronic kidney disease: Secondary | ICD-10-CM | POA: Diagnosis not present

## 2022-06-12 DIAGNOSIS — S81802D Unspecified open wound, left lower leg, subsequent encounter: Secondary | ICD-10-CM | POA: Diagnosis not present

## 2022-06-15 DIAGNOSIS — I130 Hypertensive heart and chronic kidney disease with heart failure and stage 1 through stage 4 chronic kidney disease, or unspecified chronic kidney disease: Secondary | ICD-10-CM | POA: Diagnosis not present

## 2022-06-15 DIAGNOSIS — I509 Heart failure, unspecified: Secondary | ICD-10-CM | POA: Diagnosis not present

## 2022-06-15 DIAGNOSIS — S2241XD Multiple fractures of ribs, right side, subsequent encounter for fracture with routine healing: Secondary | ICD-10-CM | POA: Diagnosis not present

## 2022-06-15 DIAGNOSIS — S81802D Unspecified open wound, left lower leg, subsequent encounter: Secondary | ICD-10-CM | POA: Diagnosis not present

## 2022-06-15 DIAGNOSIS — N182 Chronic kidney disease, stage 2 (mild): Secondary | ICD-10-CM | POA: Diagnosis not present

## 2022-06-15 DIAGNOSIS — S61401D Unspecified open wound of right hand, subsequent encounter: Secondary | ICD-10-CM | POA: Diagnosis not present

## 2022-06-16 DIAGNOSIS — I509 Heart failure, unspecified: Secondary | ICD-10-CM | POA: Diagnosis not present

## 2022-06-16 DIAGNOSIS — S81802D Unspecified open wound, left lower leg, subsequent encounter: Secondary | ICD-10-CM | POA: Diagnosis not present

## 2022-06-16 DIAGNOSIS — N182 Chronic kidney disease, stage 2 (mild): Secondary | ICD-10-CM | POA: Diagnosis not present

## 2022-06-16 DIAGNOSIS — S2241XD Multiple fractures of ribs, right side, subsequent encounter for fracture with routine healing: Secondary | ICD-10-CM | POA: Diagnosis not present

## 2022-06-16 DIAGNOSIS — S61401D Unspecified open wound of right hand, subsequent encounter: Secondary | ICD-10-CM | POA: Diagnosis not present

## 2022-06-16 DIAGNOSIS — I130 Hypertensive heart and chronic kidney disease with heart failure and stage 1 through stage 4 chronic kidney disease, or unspecified chronic kidney disease: Secondary | ICD-10-CM | POA: Diagnosis not present

## 2022-06-19 DIAGNOSIS — I130 Hypertensive heart and chronic kidney disease with heart failure and stage 1 through stage 4 chronic kidney disease, or unspecified chronic kidney disease: Secondary | ICD-10-CM | POA: Diagnosis not present

## 2022-06-19 DIAGNOSIS — S2241XD Multiple fractures of ribs, right side, subsequent encounter for fracture with routine healing: Secondary | ICD-10-CM | POA: Diagnosis not present

## 2022-06-19 DIAGNOSIS — S81802D Unspecified open wound, left lower leg, subsequent encounter: Secondary | ICD-10-CM | POA: Diagnosis not present

## 2022-06-19 DIAGNOSIS — N182 Chronic kidney disease, stage 2 (mild): Secondary | ICD-10-CM | POA: Diagnosis not present

## 2022-06-19 DIAGNOSIS — I509 Heart failure, unspecified: Secondary | ICD-10-CM | POA: Diagnosis not present

## 2022-06-19 DIAGNOSIS — S61401D Unspecified open wound of right hand, subsequent encounter: Secondary | ICD-10-CM | POA: Diagnosis not present

## 2022-06-19 IMAGING — DX DG CHEST 1V PORT
1 series · 1 of 1 positions shown · non-contrast
Comparison: 04/27/2021

CLINICAL DATA: Shortness of breath

EXAM:
PORTABLE CHEST 1 VIEW

[chest ap]
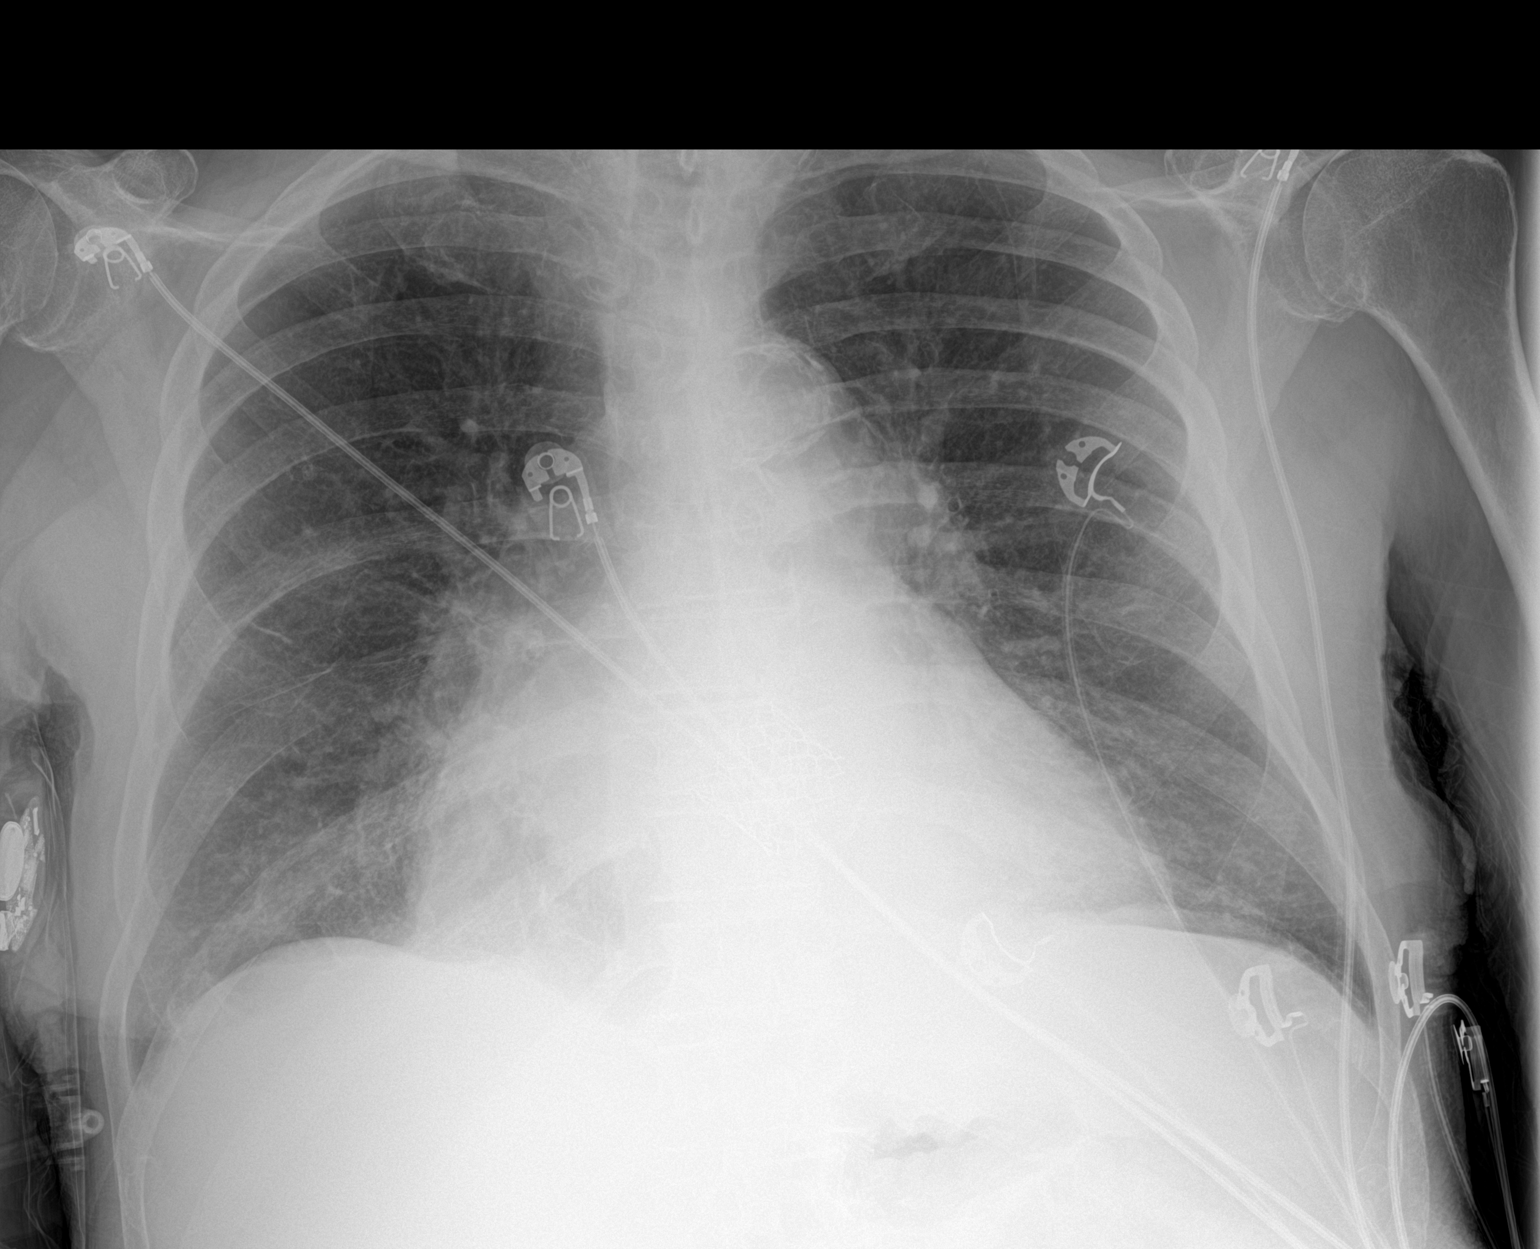

[1 of 1 positions shown; findings below may reference images not displayed]

FINDINGS: Stable cardiomegaly status post TAVR. Aortic atherosclerosis.
Streaky bibasilar opacities, likely atelectasis. No pleural effusion
or pneumothorax.
IMPRESSION: Streaky bibasilar opacities, likely atelectasis.

## 2022-06-22 DIAGNOSIS — S61401D Unspecified open wound of right hand, subsequent encounter: Secondary | ICD-10-CM | POA: Diagnosis not present

## 2022-06-22 DIAGNOSIS — I130 Hypertensive heart and chronic kidney disease with heart failure and stage 1 through stage 4 chronic kidney disease, or unspecified chronic kidney disease: Secondary | ICD-10-CM | POA: Diagnosis not present

## 2022-06-22 DIAGNOSIS — I509 Heart failure, unspecified: Secondary | ICD-10-CM | POA: Diagnosis not present

## 2022-06-22 DIAGNOSIS — N182 Chronic kidney disease, stage 2 (mild): Secondary | ICD-10-CM | POA: Diagnosis not present

## 2022-06-22 DIAGNOSIS — S81802D Unspecified open wound, left lower leg, subsequent encounter: Secondary | ICD-10-CM | POA: Diagnosis not present

## 2022-06-22 DIAGNOSIS — S2241XD Multiple fractures of ribs, right side, subsequent encounter for fracture with routine healing: Secondary | ICD-10-CM | POA: Diagnosis not present

## 2022-06-23 ENCOUNTER — Encounter (HOSPITAL_COMMUNITY): Payer: Self-pay | Admitting: Internal Medicine

## 2022-06-23 ENCOUNTER — Emergency Department (HOSPITAL_COMMUNITY): Payer: Medicare Other

## 2022-06-23 ENCOUNTER — Inpatient Hospital Stay (HOSPITAL_COMMUNITY)
Admission: EM | Admit: 2022-06-23 | Discharge: 2022-06-30 | DRG: 602 | Disposition: A | Discharge status: Skilled Nursing Facility | Payer: Medicare Other | Source: Skilled Nursing Facility | Attending: Family Medicine | Admitting: Family Medicine

## 2022-06-23 ENCOUNTER — Other Ambulatory Visit: Payer: Self-pay

## 2022-06-23 DIAGNOSIS — L89311 Pressure ulcer of right buttock, stage 1: Secondary | ICD-10-CM | POA: Diagnosis present

## 2022-06-23 DIAGNOSIS — I350 Nonrheumatic aortic (valve) stenosis: Secondary | ICD-10-CM | POA: Diagnosis present

## 2022-06-23 DIAGNOSIS — L89321 Pressure ulcer of left buttock, stage 1: Secondary | ICD-10-CM | POA: Diagnosis present

## 2022-06-23 DIAGNOSIS — Z681 Body mass index (BMI) 19 or less, adult: Secondary | ICD-10-CM

## 2022-06-23 DIAGNOSIS — N400 Enlarged prostate without lower urinary tract symptoms: Secondary | ICD-10-CM | POA: Diagnosis present

## 2022-06-23 DIAGNOSIS — K761 Chronic passive congestion of liver: Secondary | ICD-10-CM | POA: Diagnosis present

## 2022-06-23 DIAGNOSIS — L8990 Pressure ulcer of unspecified site, unspecified stage: Secondary | ICD-10-CM | POA: Diagnosis present

## 2022-06-23 DIAGNOSIS — Y92129 Unspecified place in nursing home as the place of occurrence of the external cause: Secondary | ICD-10-CM | POA: Diagnosis not present

## 2022-06-23 DIAGNOSIS — R1314 Dysphagia, pharyngoesophageal phase: Secondary | ICD-10-CM | POA: Diagnosis not present

## 2022-06-23 DIAGNOSIS — L03113 Cellulitis of right upper limb: Secondary | ICD-10-CM | POA: Diagnosis not present

## 2022-06-23 DIAGNOSIS — K219 Gastro-esophageal reflux disease without esophagitis: Secondary | ICD-10-CM | POA: Diagnosis present

## 2022-06-23 DIAGNOSIS — Z66 Do not resuscitate: Secondary | ICD-10-CM | POA: Diagnosis not present

## 2022-06-23 DIAGNOSIS — I4519 Other right bundle-branch block: Secondary | ICD-10-CM | POA: Diagnosis not present

## 2022-06-23 DIAGNOSIS — F419 Anxiety disorder, unspecified: Secondary | ICD-10-CM | POA: Diagnosis present

## 2022-06-23 DIAGNOSIS — I5022 Chronic systolic (congestive) heart failure: Secondary | ICD-10-CM | POA: Diagnosis not present

## 2022-06-23 DIAGNOSIS — Z952 Presence of prosthetic heart valve: Secondary | ICD-10-CM | POA: Diagnosis not present

## 2022-06-23 DIAGNOSIS — K7460 Unspecified cirrhosis of liver: Secondary | ICD-10-CM | POA: Diagnosis not present

## 2022-06-23 DIAGNOSIS — S51811A Laceration without foreign body of right forearm, initial encounter: Secondary | ICD-10-CM | POA: Diagnosis present

## 2022-06-23 DIAGNOSIS — I129 Hypertensive chronic kidney disease with stage 1 through stage 4 chronic kidney disease, or unspecified chronic kidney disease: Secondary | ICD-10-CM | POA: Diagnosis not present

## 2022-06-23 DIAGNOSIS — Z9049 Acquired absence of other specified parts of digestive tract: Secondary | ICD-10-CM

## 2022-06-23 DIAGNOSIS — R791 Abnormal coagulation profile: Secondary | ICD-10-CM | POA: Diagnosis present

## 2022-06-23 DIAGNOSIS — M7021 Olecranon bursitis, right elbow: Secondary | ICD-10-CM | POA: Diagnosis present

## 2022-06-23 DIAGNOSIS — I739 Peripheral vascular disease, unspecified: Secondary | ICD-10-CM | POA: Diagnosis present

## 2022-06-23 DIAGNOSIS — B955 Unspecified streptococcus as the cause of diseases classified elsewhere: Secondary | ICD-10-CM | POA: Diagnosis not present

## 2022-06-23 DIAGNOSIS — I272 Pulmonary hypertension, unspecified: Secondary | ICD-10-CM | POA: Diagnosis present

## 2022-06-23 DIAGNOSIS — H3530 Unspecified macular degeneration: Secondary | ICD-10-CM | POA: Diagnosis present

## 2022-06-23 DIAGNOSIS — R7881 Bacteremia: Secondary | ICD-10-CM | POA: Diagnosis present

## 2022-06-23 DIAGNOSIS — R509 Fever, unspecified: Secondary | ICD-10-CM | POA: Diagnosis not present

## 2022-06-23 DIAGNOSIS — I5023 Acute on chronic systolic (congestive) heart failure: Secondary | ICD-10-CM | POA: Diagnosis not present

## 2022-06-23 DIAGNOSIS — D631 Anemia in chronic kidney disease: Secondary | ICD-10-CM | POA: Diagnosis not present

## 2022-06-23 DIAGNOSIS — W19XXXA Unspecified fall, initial encounter: Secondary | ICD-10-CM | POA: Diagnosis present

## 2022-06-23 DIAGNOSIS — L03311 Cellulitis of abdominal wall: Secondary | ICD-10-CM | POA: Diagnosis not present

## 2022-06-23 DIAGNOSIS — K5900 Constipation, unspecified: Secondary | ICD-10-CM | POA: Diagnosis present

## 2022-06-23 DIAGNOSIS — K7469 Other cirrhosis of liver: Secondary | ICD-10-CM | POA: Diagnosis not present

## 2022-06-23 DIAGNOSIS — E663 Overweight: Secondary | ICD-10-CM | POA: Diagnosis present

## 2022-06-23 DIAGNOSIS — I4821 Permanent atrial fibrillation: Secondary | ICD-10-CM | POA: Diagnosis not present

## 2022-06-23 DIAGNOSIS — Z6826 Body mass index (BMI) 26.0-26.9, adult: Secondary | ICD-10-CM

## 2022-06-23 DIAGNOSIS — Z9861 Coronary angioplasty status: Secondary | ICD-10-CM

## 2022-06-23 DIAGNOSIS — I2510 Atherosclerotic heart disease of native coronary artery without angina pectoris: Secondary | ICD-10-CM | POA: Diagnosis present

## 2022-06-23 DIAGNOSIS — Z953 Presence of xenogenic heart valve: Secondary | ICD-10-CM

## 2022-06-23 DIAGNOSIS — B954 Other streptococcus as the cause of diseases classified elsewhere: Secondary | ICD-10-CM | POA: Diagnosis not present

## 2022-06-23 DIAGNOSIS — S61511A Laceration without foreign body of right wrist, initial encounter: Secondary | ICD-10-CM | POA: Diagnosis not present

## 2022-06-23 DIAGNOSIS — L0390 Cellulitis, unspecified: Secondary | ICD-10-CM | POA: Diagnosis not present

## 2022-06-23 DIAGNOSIS — R2689 Other abnormalities of gait and mobility: Secondary | ICD-10-CM | POA: Diagnosis not present

## 2022-06-23 DIAGNOSIS — I10 Essential (primary) hypertension: Secondary | ICD-10-CM | POA: Diagnosis not present

## 2022-06-23 DIAGNOSIS — Z79899 Other long term (current) drug therapy: Secondary | ICD-10-CM

## 2022-06-23 DIAGNOSIS — Z8249 Family history of ischemic heart disease and other diseases of the circulatory system: Secondary | ICD-10-CM

## 2022-06-23 DIAGNOSIS — Z7901 Long term (current) use of anticoagulants: Secondary | ICD-10-CM | POA: Diagnosis not present

## 2022-06-23 DIAGNOSIS — E7800 Pure hypercholesterolemia, unspecified: Secondary | ICD-10-CM | POA: Diagnosis present

## 2022-06-23 DIAGNOSIS — Z96652 Presence of left artificial knee joint: Secondary | ICD-10-CM | POA: Diagnosis present

## 2022-06-23 DIAGNOSIS — I5043 Acute on chronic combined systolic (congestive) and diastolic (congestive) heart failure: Secondary | ICD-10-CM | POA: Diagnosis present

## 2022-06-23 DIAGNOSIS — W19XXXD Unspecified fall, subsequent encounter: Secondary | ICD-10-CM | POA: Diagnosis not present

## 2022-06-23 DIAGNOSIS — Z85828 Personal history of other malignant neoplasm of skin: Secondary | ICD-10-CM

## 2022-06-23 DIAGNOSIS — J90 Pleural effusion, not elsewhere classified: Secondary | ICD-10-CM | POA: Diagnosis not present

## 2022-06-23 DIAGNOSIS — R531 Weakness: Secondary | ICD-10-CM | POA: Diagnosis not present

## 2022-06-23 DIAGNOSIS — I701 Atherosclerosis of renal artery: Secondary | ICD-10-CM | POA: Diagnosis present

## 2022-06-23 DIAGNOSIS — I959 Hypotension, unspecified: Secondary | ICD-10-CM | POA: Diagnosis not present

## 2022-06-23 DIAGNOSIS — I878 Other specified disorders of veins: Secondary | ICD-10-CM | POA: Diagnosis present

## 2022-06-23 DIAGNOSIS — I130 Hypertensive heart and chronic kidney disease with heart failure and stage 1 through stage 4 chronic kidney disease, or unspecified chronic kidney disease: Secondary | ICD-10-CM | POA: Diagnosis present

## 2022-06-23 DIAGNOSIS — Z86711 Personal history of pulmonary embolism: Secondary | ICD-10-CM

## 2022-06-23 DIAGNOSIS — N1831 Chronic kidney disease, stage 3a: Secondary | ICD-10-CM | POA: Diagnosis not present

## 2022-06-23 DIAGNOSIS — S51801A Unspecified open wound of right forearm, initial encounter: Secondary | ICD-10-CM | POA: Diagnosis not present

## 2022-06-23 DIAGNOSIS — E785 Hyperlipidemia, unspecified: Secondary | ICD-10-CM | POA: Diagnosis not present

## 2022-06-23 DIAGNOSIS — M79603 Pain in arm, unspecified: Secondary | ICD-10-CM | POA: Diagnosis not present

## 2022-06-23 DIAGNOSIS — I872 Venous insufficiency (chronic) (peripheral): Secondary | ICD-10-CM | POA: Diagnosis not present

## 2022-06-23 DIAGNOSIS — B950 Streptococcus, group A, as the cause of diseases classified elsewhere: Secondary | ICD-10-CM | POA: Diagnosis not present

## 2022-06-23 DIAGNOSIS — M6281 Muscle weakness (generalized): Secondary | ICD-10-CM | POA: Diagnosis not present

## 2022-06-23 LAB — BASIC METABOLIC PANEL
Anion gap: 9 (ref 5–15)
BUN: 40 mg/dL — ABNORMAL HIGH (ref 8–23)
CO2: 23 mmol/L (ref 22–32)
Calcium: 8.7 mg/dL — ABNORMAL LOW (ref 8.9–10.3)
Chloride: 104 mmol/L (ref 98–111)
Creatinine, Ser: 1.34 mg/dL — ABNORMAL HIGH (ref 0.61–1.24)
GFR, Estimated: 49 mL/min — ABNORMAL LOW (ref >60)
Glucose, Bld: 99 mg/dL (ref 70–99)
Potassium: 4.5 mmol/L (ref 3.5–5.1)
Sodium: 136 mmol/L (ref 135–145)

## 2022-06-23 LAB — CBC WITH DIFFERENTIAL/PLATELET
Abs Immature Granulocytes: 0.07 10*3/uL (ref 0.00–0.07)
Basophils Absolute: 0.1 10*3/uL (ref 0.0–0.1)
Basophils Relative: 0 %
Eosinophils Absolute: 0 10*3/uL (ref 0.0–0.5)
Eosinophils Relative: 0 %
HCT: 35.4 % — ABNORMAL LOW (ref 39.0–52.0)
Hemoglobin: 11.1 g/dL — ABNORMAL LOW (ref 13.0–17.0)
Immature Granulocytes: 1 %
Lymphocytes Relative: 3 %
Lymphs Abs: 0.4 10*3/uL — ABNORMAL LOW (ref 0.7–4.0)
MCH: 29.8 pg (ref 26.0–34.0)
MCHC: 31.4 g/dL (ref 30.0–36.0)
MCV: 94.9 fL (ref 80.0–100.0)
Monocytes Absolute: 1.1 10*3/uL — ABNORMAL HIGH (ref 0.1–1.0)
Monocytes Relative: 7 %
Neutro Abs: 13.5 10*3/uL — ABNORMAL HIGH (ref 1.7–7.7)
Neutrophils Relative %: 89 %
Platelets: 186 10*3/uL (ref 150–400)
RBC: 3.73 MIL/uL — ABNORMAL LOW (ref 4.22–5.81)
RDW: 16 % — ABNORMAL HIGH (ref 11.5–15.5)
WBC: 15.1 10*3/uL — ABNORMAL HIGH (ref 4.0–10.5)
nRBC: 0 % (ref 0.0–0.2)

## 2022-06-23 LAB — LACTIC ACID, PLASMA: Lactic Acid, Venous: 1.2 mmol/L (ref 0.5–1.9)

## 2022-06-23 LAB — CULTURE, BLOOD (ROUTINE X 2)

## 2022-06-23 MED ORDER — ATORVASTATIN CALCIUM 40 MG PO TABS
20.0000 mg | ORAL_TABLET | Freq: Every day | ORAL | Status: DC
  Administered 2022-06-24 – 2022-06-30 (×7): 20 mg via ORAL
  Filled 2022-06-23 (×7): qty 1

## 2022-06-23 MED ORDER — VANCOMYCIN HCL IN DEXTROSE 1-5 GM/200ML-% IV SOLN
1000.0000 mg | Freq: Once | INTRAVENOUS | Status: AC
  Administered 2022-06-23: 1000 mg via INTRAVENOUS
  Filled 2022-06-23: qty 200

## 2022-06-23 MED ORDER — ISOSORBIDE MONONITRATE ER 30 MG PO TB24
30.0000 mg | ORAL_TABLET | Freq: Every day | ORAL | Status: DC
  Administered 2022-06-24 – 2022-06-30 (×7): 30 mg via ORAL
  Filled 2022-06-23 (×8): qty 1

## 2022-06-23 MED ORDER — SACUBITRIL-VALSARTAN 24-26 MG PO TABS
1.0000 | ORAL_TABLET | Freq: Two times a day (BID) | ORAL | Status: DC
  Administered 2022-06-23 – 2022-06-30 (×14): 1 via ORAL
  Filled 2022-06-23 (×16): qty 1

## 2022-06-23 MED ORDER — WARFARIN - PHARMACIST DOSING INPATIENT: Freq: Every day | Status: DC

## 2022-06-23 MED ORDER — FINASTERIDE 5 MG PO TABS
5.0000 mg | ORAL_TABLET | Freq: Every day | ORAL | Status: DC
  Administered 2022-06-24 – 2022-06-30 (×7): 5 mg via ORAL
  Filled 2022-06-23 (×7): qty 1

## 2022-06-23 MED ORDER — LACTULOSE 10 GM/15ML PO SOLN
20.0000 g | Freq: Every day | ORAL | Status: DC
  Administered 2022-06-24 – 2022-06-30 (×7): 20 g via ORAL
  Filled 2022-06-23 (×7): qty 30

## 2022-06-23 MED ORDER — FESOTERODINE FUMARATE ER 8 MG PO TB24
8.0000 mg | ORAL_TABLET | Freq: Every day | ORAL | Status: DC
  Administered 2022-06-24 – 2022-06-30 (×7): 8 mg via ORAL
  Filled 2022-06-23 (×8): qty 1

## 2022-06-23 MED ORDER — SPIRONOLACTONE 25 MG PO TABS
25.0000 mg | ORAL_TABLET | Freq: Every day | ORAL | Status: DC
  Administered 2022-06-24 – 2022-06-30 (×7): 25 mg via ORAL
  Filled 2022-06-23 (×8): qty 1

## 2022-06-23 MED ORDER — FUROSEMIDE 40 MG PO TABS: 60.0000 mg | ORAL_TABLET | Freq: Every day | ORAL | Status: DC

## 2022-06-23 MED ORDER — TAMSULOSIN HCL 0.4 MG PO CAPS
0.4000 mg | ORAL_CAPSULE | Freq: Every day | ORAL | Status: DC
  Administered 2022-06-24 – 2022-06-30 (×7): 0.4 mg via ORAL
  Filled 2022-06-23 (×7): qty 1

## 2022-06-23 MED ORDER — LACTATED RINGERS IV SOLN: INTRAVENOUS | Status: DC

## 2022-06-23 MED ORDER — IOHEXOL 300 MG/ML  SOLN
100.0000 mL | Freq: Once | INTRAMUSCULAR | Status: AC | PRN
  Administered 2022-06-23: 100 mL via INTRAVENOUS

## 2022-06-23 MED ORDER — METOPROLOL SUCCINATE ER 25 MG PO TB24
12.5000 mg | ORAL_TABLET | Freq: Every day | ORAL | Status: DC
  Administered 2022-06-24 – 2022-06-30 (×7): 12.5 mg via ORAL
  Filled 2022-06-23 (×7): qty 1

## 2022-06-23 MED ORDER — LORAZEPAM 1 MG PO TABS
1.0000 mg | ORAL_TABLET | Freq: Two times a day (BID) | ORAL | Status: DC | PRN
  Administered 2022-06-27 – 2022-06-28 (×2): 1 mg via ORAL
  Filled 2022-06-23 (×2): qty 1

## 2022-06-23 NOTE — Assessment & Plan Note (Addendum)
-  Continue Toprol XL, Entresto, spironolactone

## 2022-06-23 NOTE — Assessment & Plan Note (Signed)
Pharmacy to manage coumadin.

## 2022-06-23 NOTE — Assessment & Plan Note (Signed)
Stable

## 2022-06-23 NOTE — Assessment & Plan Note (Signed)
Stable. Continue GDMT.

## 2022-06-23 NOTE — ED Triage Notes (Signed)
Pt BIB EMS from [facility] c/o right arm pain and fever. 500mg  Tylenol given pta

## 2022-06-23 NOTE — Assessment & Plan Note (Addendum)
EDP discussed case with Dr. Aundria Rud with orthopedics. EDP felt uncomfortable with draining bursa. Ortho wanted abx and elevation. Ortho was not planning on seeing patient in consult. If bursitis not improving with abx, please formally consult orthopedics.

## 2022-06-23 NOTE — ED Provider Notes (Signed)
[facility] EMERGENCY DEPARTMENT AT [facility] Provider Note   CSN: [id number] Arrival date & time: 06/23/22  1631     History  Chief Complaint  Patient presents with   Arm Injury    Tykee Hatchel is a 89 y.o. male.  89 year old male presents due to pain and swelling to his right wrist and forearm.  States that 4 days ago he had fallen and while he was getting up at this facility he suffered a skin tear.  Since that time he has had increasing erythema and edema.  Denies any distal numbness or tingling.  Noted some chills myalgias today.  EMS noted patient be febrile and given Tylenol and transported here      Home Medications Prior to Admission medications   Medication Sig Start Date End Date Taking? Authorizing Provider  acetaminophen (TYLENOL) 500 MG tablet Take 500 mg by mouth every 4 (four) hours as needed for moderate pain.    [provider]  atorvastatin (LIPITOR) 20 MG tablet TAKE 1 TABLET ONCE DAILY. 12/30/20   Tonny Bollman, MD  Calcium-Magnesium-Zinc (CAL-MAG-ZINC PO) Take 1 tablet by mouth every morning.    [provider]  Cholecalciferol (VITAMIN D-3) 125 MCG (5000 UT) TABS Take 5,000 Units by mouth every morning.    [provider]  fesoterodine (TOVIAZ) 8 MG TB24 tablet Take 8 mg by mouth daily.    [provider]  finasteride (PROSCAR) 5 MG tablet Take 1 tablet (5 mg total) by mouth daily. 06/21/20   Marinda Elk, MD  furosemide (LASIX) 40 MG tablet Take 1.5 tablets (60 mg total) by mouth daily. 12/05/21   Tereso Newcomer T, PA-C  furosemide (LASIX) 40 MG tablet Take 1.5 tablets (60 mg total) by mouth 2 (two) times daily for 3 days. After 3 days, go back to normal once a day dosing 04/24/22 04/27/22  Gilda Crease, MD  isosorbide mononitrate (IMDUR) 30 MG 24 hr tablet Take 1 tablet (30 mg total) by mouth daily. 04/14/22   Zannie Cove, MD  lactulose (CHRONULAC) 10 GM/15ML solution Take 30 mLs (20 g  total) by mouth daily. 04/14/22   Zannie Cove, MD  LORazepam (ATIVAN) 1 MG tablet Take 1 mg by mouth 2 (two) times daily as needed for anxiety.    [provider]  metoprolol succinate (TOPROL XL) 25 MG 24 hr tablet Take 0.5 tablets (12.5 mg total) by mouth daily. 10/05/21   Tereso Newcomer T, PA-C  Multiple Vitamins-Minerals (PRESERVISION AREDS) TABS Take 1 tablet by mouth daily. 03/04/09   [provider]  nitroGLYCERIN (NITROSTAT) 0.4 MG SL tablet Place 0.4 mg under the tongue every 5 (five) minutes as needed for chest pain.    [provider]  nystatin cream (MYCOSTATIN) Apply 1 Application topically 2 (two) times daily. 11/28/21   [provider]  polyethylene glycol (MIRALAX / GLYCOLAX) 17 g packet Take 17 g by mouth daily as needed. 12/26/21   Arnetha Courser, MD  Polyethylene Glycol 3350 POWD Take 17 g by mouth daily.    [provider]  sacubitril-valsartan (ENTRESTO) 24-26 MG Take 1 tablet by mouth 2 (two) times daily. Please keep upcoming appointment for future refills thank you. 07/04/21   Tonny Bollman, MD  spironolactone (ALDACTONE) 25 MG tablet Take 1 tablet (25 mg total) by mouth daily. 10/05/21   Tereso Newcomer T, PA-C  tamsulosin (FLOMAX) 0.4 MG CAPS capsule Take 1 capsule (0.4 mg total) by mouth daily. 06/21/20   Feliz  Rosine Beat, MD  warfarin (COUMADIN) 2.5 MG tablet Take 2.5-5 mg by mouth See admin instructions. Take 2.5 mg by mouth on Sunday, Tuesday, Thursday and Saturday. Take 5mg  by mouth on Monday, Wednesday and Friday. 11/30/21   [provider]      Allergies    Patient has no known allergies.    Review of Systems   Review of Systems  All other systems reviewed and are negative.   Physical Exam Updated Vital Signs Temp 99.1 F (37.3 C)   Ht 1.727 m (5\' 8" )   Wt 80 kg   BMI 26.82 kg/m  Physical Exam Vitals and nursing note reviewed.  Constitutional:      General: He is not in acute distress.    Appearance:  Normal appearance. He is well-developed. He is not toxic-appearing.  HENT:     Head: Normocephalic and atraumatic.  Eyes:     General: Lids are normal.     Conjunctiva/sclera: Conjunctivae normal.     Pupils: Pupils are equal, round, and reactive to light.  Neck:     Thyroid: No thyroid mass.     Trachea: No tracheal deviation.  Cardiovascular:     Rate and Rhythm: Normal rate and regular rhythm.     Heart sounds: Normal heart sounds. No murmur heard.    No gallop.  Pulmonary:     Effort: Pulmonary effort is normal. No respiratory distress.     Breath sounds: Normal breath sounds. No stridor. No decreased breath sounds, wheezing, rhonchi or rales.  Abdominal:     General: There is no distension.     Palpations: Abdomen is soft.     Tenderness: There is no abdominal tenderness. There is no rebound.  Musculoskeletal:        General: No tenderness. Normal range of motion.       Arms:     Cervical back: Normal range of motion and neck supple.     Comments: Patient also has lymphangitic spread going up the volar aspect of his right arm  Skin:    General: Skin is warm and dry.     Findings: No abrasion or rash.  Neurological:     Mental Status: He is alert and oriented to person, place, and time. Mental status is at baseline.     GCS: GCS eye subscore is 4. GCS verbal subscore is 5. GCS motor subscore is 6.     Cranial Nerves: No cranial nerve deficit.     Sensory: No sensory deficit.     Motor: Motor function is intact.  Psychiatric:        Attention and Perception: Attention normal.        Speech: Speech normal.        Behavior: Behavior normal.    ED Results / Procedures / Treatments   Labs (all labs ordered are listed, but only abnormal results are displayed) Labs Reviewed - No data to display  EKG None  Radiology No results found.  Procedures Procedures    Medications Ordered in ED Medications  lactated ringers infusion (has no administration in time range)     ED Course/ Medical Decision Making/ A&P                             Medical Decision Making Amount and/or Complexity of Data Reviewed Labs: ordered. Radiology: ordered.  Risk Prescription drug management.   Patient is febrile here and has evidence of  cellulitis.  Concern for possible deep space infection.  Started on vancomycin.  CT images were extremity which did not show any signs of abscess.  Patient has significant leukocytosis noted on his CBC.  Will require admission.  Will consult hospitalist team        Final Clinical Impression(s) / ED Diagnoses Final diagnoses:  None    Rx / DC Orders ED Discharge Orders     None         Lorre Nick, MD 06/23/22 2210

## 2022-06-23 NOTE — Assessment & Plan Note (Addendum)
Admit to med/surg bed. IV rocephin/vanco. Wound care consult

## 2022-06-23 NOTE — Assessment & Plan Note (Addendum)
Verified DNR/DNI status with patient. Pt will need yellow DNR form completed prior to discharge. Please take a picture of DNR form and file it into Media tab within Epic for documentation and future use.

## 2022-06-23 NOTE — Assessment & Plan Note (Signed)
Monitor Scr while on vancomycin therapy.

## 2022-06-23 NOTE — Subjective & Objective (Signed)
CC: right arm cellulitis HPI: 89 year old male history of chronic systolic heart failure EF 40%, permanent A-fib, history of TAVR, hypertension, CKD stage IIIa who presents to the ER after a fall.  Patient with a skin tear of his right arm.  Increasing erythema.  Patient reportedly had a fever.  Patient spiked a temp to 101.6 in the ER.  Heart rate 74 blood pressure 139/60.  White count 15.1, hemoglobin 0.1, platelets of 186  Sodium 136, potassium 4.5, chloride 104, bicarb 23, BUN 40, creatinine 1.34, glucose of 99  CT right forearm demonstrates distended versus measuring 4.7 x 1.8 cm.  Extensive subcutaneous edema.  [facility] hospitalist contacted for admission.

## 2022-06-23 NOTE — H&P (Signed)
History and Physical    Tyler Williams ZOX:[id number] DOB: Sep 18, 1926 DOA: 06/23/2022  DOS: the patient was seen and examined on 06/23/2022  PCP: Geoffry Paradise, MD   Patient coming from:  ALF([facility])  I have personally briefly reviewed patient's old medical records in [place] [facility]  CC: right arm cellulitis HPI: 89 year old male history of chronic systolic heart failure EF 40%, permanent A-fib, history of TAVR, hypertension, CKD stage IIIa who presents to the ER after a fall.  Patient with a skin tear of his right arm.  Increasing erythema.  Patient reportedly had a fever.  Patient spiked a temp to 101.6 in the ER.  Heart rate 74 blood pressure 139/60.  White count 15.1, hemoglobin 0.1, platelets of 186  Sodium 136, potassium 4.5, chloride 104, bicarb 23, BUN 40, creatinine 1.34, glucose of 99  CT right forearm demonstrates distended versus measuring 4.7 x 1.8 cm.  Extensive subcutaneous edema.  [facility] hospitalist contacted for admission.   ED Course: WBC 15, CT forearms shows right elbow bursitis  Review of Systems:  Review of Systems  Constitutional:  Positive for fever.  HENT: Negative.    Eyes: Negative.   Cardiovascular: Negative.   Gastrointestinal: Negative.   Genitourinary: Negative.   Musculoskeletal:  Positive for falls and joint pain.       Right elbow pain  Skin:        Chronic venous stasis lower legs  Neurological: Negative.   Endo/Heme/Allergies: Negative.   Psychiatric/Behavioral: Negative.    All other systems reviewed and are negative.   Past Medical History:  Diagnosis Date   AAA (abdominal aortic aneurysm) (HCC)    s/p repair   Acute on chronic systolic (congestive) heart failure (HCC) 12/22/2021   Aortic atherosclerosis (HCC)    Arthritis    knees   CAD (coronary artery disease)    a. s/p POBA 1980s, 1990s (details unclear). b. known stenotic RCA (previous unsuccessful intervention)   Cancer (HCC)    skin cancer removed,  right hand, left leg, chest   Carotid artery disease (HCC)    Cholecystitis, acute 12/06/2012   Chronic anticoagulation    Coumadin   Chronic kidney disease, stage 3a (HCC)    Cirrhosis of liver (HCC)    by imaging   Coronary artery disease    GERD (gastroesophageal reflux disease)    Heart failure with mildly reduced ejection fraction (HFmrEF) (HCC)    History of pulmonary embolism    1964 related to dislocated hip on right    History of skin cancer    Hypercholesteremia    Hypertension    Incidental cecal mass noted on CT imaging 05/24/2016   **An incidental finding of potential clinical significance has been found. 4.6 x 5.6 x 3.7 cm masslike lesion in the cecum adjacent to the ileocecal valve may represent a large polyp or colonic neoplasm. 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 Unable to pass wire past RCA lesion->medically managed   CHOLECYSTECTOMY N/A 12/05/2012   Procedure: LAPAROSCOPIC CHOLECYSTECTOMY ;  Surgeon: Mariella Saa, MD;  Location: [facility] OR;   Service: General;  Laterality: N/A;   CHOLECYSTECTOMY     CORONARY ANGIOPLASTY  1980, 1990   CORONARY BALLOON ANGIOPLASTY N/A 08/31/2017   Procedure: CORONARY BALLOON ANGIOPLASTY;  Surgeon: Swaziland, Peter M, MD;  Location: [facility] INVASIVE CV LAB;  Service: Cardiovascular;  Laterality: N/A;   EYE SURGERY     hx of cataract surgery    HEMORRHOID SURGERY     1973   HERNIA REPAIR     right inguinal hernia repair    JOINT REPLACEMENT     left knee replacement, partial right   LEFT HEART CATH AND CORONARY ANGIOGRAPHY N/A 08/31/2017   Procedure: LEFT HEART CATH AND CORONARY ANGIOGRAPHY;  Surgeon: Swaziland, Peter M, MD;  Location: [facility] INVASIVE CV LAB;  Service: Cardiovascular;  Laterality: N/A;   LEFT HEART CATHETERIZATION WITH CORONARY ANGIOGRAM N/A 05/24/2012   Procedure: LEFT HEART CATHETERIZATION WITH CORONARY ANGIOGRAM;  Surgeon: Kathleene Hazel, MD;  Location: [facility] CATH LAB;  Service: Cardiovascular;  Laterality: N/A;   ORTHOPEDIC SURGERY     OTHER SURGICAL HISTORY     right knee popliteal aneurysm surgery stent placed    OTHER SURGICAL HISTORY     PARTIAL KNEE ARTHROPLASTY  01/22/2012   Procedure: UNICOMPARTMENTAL KNEE;  Surgeon: [name], MD;  Location: [facility] ORS;  Service: Orthopedics;  Laterality: Right;   PERCUTANEOUS CORONARY INTERVENTION-BALLOON ONLY  05/24/2012   Procedure: PERCUTANEOUS CORONARY INTERVENTION-BALLOON ONLY;  Surgeon: Kathleene Hazel, MD;  Location: [facility] CATH LAB;  Service: Cardiovascular;;   RIGHT/LEFT HEART CATH AND CORONARY ANGIOGRAPHY N/A 05/12/2016   Procedure: Right/Left Heart Cath and Coronary Angiography;  Surgeon: Tonny Bollman, MD;  Location: [facility] INVASIVE CV LAB;  Service: Cardiovascular;  Laterality: N/A;   TEE WITHOUT CARDIOVERSION N/A 06/13/2016   Procedure: TRANSESOPHAGEAL ECHOCARDIOGRAM (TEE);  Surgeon: Tonny Bollman, MD;  Location: [facility] OR;  Service: Open Heart Surgery;  Laterality: N/A;   TONSILLECTOMY     TOTAL KNEE ARTHROPLASTY Left    TRANSCATHETER  AORTIC VALVE REPLACEMENT, TRANSFEMORAL N/A 06/13/2016   Procedure: TRANSCATHETER AORTIC VALVE REPLACEMENT, TRANSFEMORAL;  Surgeon: Tonny Bollman, MD;  Location: [facility] OR;  Service: Open Heart Surgery;  Laterality: N/A;     reports that he has never smoked. He has never used smokeless tobacco. He reports that he does not currently use alcohol. He reports that he does not use drugs.  No Known Allergies  Family History  Problem Relation Age of Onset   Heart attack Mother 59    Prior to Admission medications   Medication Sig Start Date End Date Taking? Authorizing Provider  acetaminophen (TYLENOL) 500 MG tablet Take 500 mg by mouth every 4 (four) hours as needed for moderate pain.   Yes [provider]  atorvastatin (LIPITOR) 20 MG tablet TAKE 1 TABLET ONCE DAILY. 12/30/20  Yes Tonny Bollman, MD  Calcium-Magnesium-Zinc (CAL-MAG-ZINC PO) Take 1 tablet by mouth every morning.   Yes [provider]  Cholecalciferol (VITAMIN  D-3) 125 MCG (5000 UT) TABS Take 5,000 Units by mouth every morning.   Yes [provider]  fesoterodine (TOVIAZ) 8 MG TB24 tablet Take 8 mg by mouth daily.   Yes [provider]  finasteride (PROSCAR) 5 MG tablet Take 1 tablet (5 mg total) by mouth daily. 06/21/20  Yes Marinda Elk, MD  furosemide (LASIX) 40 MG tablet Take 1.5 tablets (60 mg total) by mouth daily. 12/05/21  Yes Weaver, Scott T, PA-C  furosemide (LASIX) 40 MG tablet Take 40 mg by mouth daily as needed for fluid or edema. If Weight Increases By 3lbs In A Day   Yes [provider]  isosorbide mononitrate (IMDUR) 30 MG 24 hr tablet Take 1 tablet (30 mg total) by mouth daily. 04/14/22  Yes Zannie Cove, MD  lactulose (CHRONULAC) 10 GM/15ML solution Take 30 mLs (20 g total) by mouth daily. 04/14/22  Yes Zannie Cove, MD  LORazepam (ATIVAN) 1 MG tablet Take 1 mg by mouth 2 (two) times daily as needed for anxiety.   Yes [provider]  metoprolol succinate  (TOPROL XL) 25 MG 24 hr tablet Take 0.5 tablets (12.5 mg total) by mouth daily. 10/05/21  Yes Weaver, Scott T, PA-C  Multiple Vitamins-Minerals (PRESERVISION AREDS) TABS Take 1 tablet by mouth daily. 03/04/09  Yes [provider]  nitroGLYCERIN (NITROSTAT) 0.4 MG SL tablet Place 0.4 mg under the tongue every 5 (five) minutes as needed for chest pain.   Yes [provider]  nystatin cream (MYCOSTATIN) Apply 1 Application topically at bedtime. 11/28/21  Yes [provider]  polyethylene glycol (MIRALAX / GLYCOLAX) 17 g packet Take 17 g by mouth daily as needed. Patient taking differently: Take 17 g by mouth daily as needed for moderate constipation. 12/26/21  Yes Arnetha Courser, MD  Polyethylene Glycol 3350 POWD Take 17 g by mouth every other day.   Yes [provider]  sacubitril-valsartan (ENTRESTO) 24-26 MG Take 1 tablet by mouth 2 (two) times daily. Please keep upcoming appointment for future refills thank you. 07/04/21  Yes Tonny Bollman, MD  spironolactone (ALDACTONE) 25 MG tablet Take 1 tablet (25 mg total) by mouth daily. 10/05/21  Yes Weaver, Scott T, PA-C  tamsulosin (FLOMAX) 0.4 MG CAPS capsule Take 1 capsule (0.4 mg total) by mouth daily. 06/21/20  Yes Marinda Elk, MD  warfarin (COUMADIN) 2.5 MG tablet Take 2.5 mg by mouth daily. 11/30/21  Yes [provider]  furosemide (LASIX) 40 MG tablet Take 1.5 tablets (60 mg total) by mouth 2 (two) times daily for 3 days. After 3 days, go back to normal once a day dosing 04/24/22 04/27/22  Gilda Crease, MD    Physical Exam: Vitals:   06/23/22 1900 06/23/22 2030 06/23/22 2251 06/23/22 2252  BP: 127/62 (!) 119/51  (!) 114/52  Pulse: 73 72  71  Resp: 16   18  Temp:   98.5 F (36.9 C)   TempSrc:   Oral   SpO2: 97% 92%  92%  Weight:      Height:        Physical Exam Vitals and nursing note reviewed.  Constitutional:      Appearance: He is not toxic-appearing.     Comments: Elderly  male, frail appearing  HENT:     Head: Normocephalic and atraumatic.     Nose: Nose normal.  Eyes:     General: No scleral icterus. Cardiovascular:     Rate and Rhythm: Normal rate. Rhythm irregular.  Heart sounds: Murmur heard.  Pulmonary:     Effort: Pulmonary effort is normal. No respiratory distress.     Breath sounds: Normal breath sounds.  Abdominal:     General: Bowel sounds are normal. There is no distension.     Tenderness: There is no abdominal tenderness.  Musculoskeletal:     Right lower leg: Edema present.     Left lower leg: Edema present.  Skin:    General: Skin is warm and dry.     Capillary Refill: Capillary refill takes less than 2 seconds.     Findings: Lesion present.     Comments: Cellulitis of right arm/forearm. Superior edge of cellulitis of right arm outlined in black marker. Skin tear of right forearm  +fluctuance of right elbow bursa  Chronic venous stasis of both lower legs.  See pictures.  Neurological:     General: No focal deficit present.     Mental Status: He is alert and oriented to person, place, and time.                 Labs on Admission: I have personally reviewed following labs and imaging studies  CBC: Recent Labs  Lab 06/23/22 1734  WBC 15.1*  NEUTROABS 13.5*  HGB 11.1*  HCT 35.4*  MCV 94.9  PLT 186   Basic Metabolic Panel: Recent Labs  Lab 06/23/22 1734  NA 136  K 4.5  CL 104  CO2 23  GLUCOSE 99  BUN 40*  CREATININE 1.34*  CALCIUM 8.7*   GFR: Estimated Creatinine Clearance: 32.6 mL/min (A) (by C-G formula based on SCr of 1.34 mg/dL (H)).  BNP (last 3 results) Recent Labs    12/22/21 1218 04/10/22 0858 04/24/22 0044  BNP 1,164.9* 1,283.4* 1,680.2*   Radiological Exams on Admission: I have personally reviewed images CT FOREARM RIGHT W CONTRAST  Result Date: 06/23/2022 CLINICAL DATA:  Soft tissue mass, deep wrist with redness up to mid humerus. Four day history of right arm pain after fall  with laceration to skin. EXAM: CT OF THE UPPER RIGHT EXTREMITY WITH CONTRAST TECHNIQUE: Multidetector CT imaging of the upper right extremity was performed according to the standard protocol following intravenous contrast administration. RADIATION DOSE REDUCTION: This exam was performed according to the departmental dose-optimization program which includes automated exposure control, adjustment of the mA and/or kV according to patient size and/or use of iterative reconstruction technique. CONTRAST:  OMNIPAQUE IOHEXOL 300 MG/ML  SOLN COMPARISON:  None Available. FINDINGS: Bones/Joint/Cartilage Osteopenia is noted. No acute fracture or dislocation is seen. Degenerative changes are present at the elbow and wrist. There is no joint effusion at the elbow Ligaments Suboptimally assessed by CT. Muscles and Tendons Within normal limits. Soft tissues Vascular calcifications are noted in the soft tissues. There is diffuse subcutaneous edema in the right forearm. There is a circumscribed hypodense collection posterior to the olecranon measuring 4.7 x 1.8 cm, suggesting distended bursa. No rim enhancing fluid collection or mass is seen. No subcutaneous emphysema. IMPRESSION: 1. No acute fracture or dislocation. 2. Extensive subcutaneous edema.  No abscess is seen. 3. Distention of the olecranon bursa, suggesting bursitis. No mass is identified. 4. Degenerative changes at the wrist and elbow. Electronically Signed   By: Thornell Sartorius M.D.   On: 06/23/2022 21:33   DG Forearm Right  Result Date: 06/23/2022 CLINICAL DATA:  Four day history of right arm pain after fall and laceration to the skin EXAM: RIGHT FOREARM - 2 VIEW; RIGHT WRIST - COMPLETE  4 VIEW COMPARISON:  None Available. FINDINGS: There is no evidence of fracture or other focal bone lesions. Soft tissues are unremarkable. Diffuse degenerative changes of the partially imaged hand and elbow. IMPRESSION: No acute displaced fracture or dislocation. No radiopaque  foreign bodies. Electronically Signed   By: Agustin Cree M.D.   On: 06/23/2022 17:54   DG Wrist Complete Right  Result Date: 06/23/2022 CLINICAL DATA:  Four day history of right arm pain after fall and laceration to the skin EXAM: RIGHT FOREARM - 2 VIEW; RIGHT WRIST - COMPLETE 4 VIEW COMPARISON:  None Available. FINDINGS: There is no evidence of fracture or other focal bone lesions. Soft tissues are unremarkable. Diffuse degenerative changes of the partially imaged hand and elbow. IMPRESSION: No acute displaced fracture or dislocation. No radiopaque foreign bodies. Electronically Signed   By: Agustin Cree M.D.   On: 06/23/2022 17:54    EKG: My personal interpretation of EKG shows: no EKG to review  Assessment/Plan Principal Problem:   Right arm cellulitis Active Problems:   Olecranon bursitis of right elbow   Essential hypertension   CAD (coronary artery disease)   Permanent atrial fibrillation (HCC)   S/P TAVR (transcatheter aortic valve replacement)   Anticoagulated on Coumadin   CKD stage 3a, GFR 45-59 ml/min (HCC)   Chronic systolic CHF (congestive heart failure) (HCC)   DNR (do not resuscitate)/DNI(Do Not Intubate)   Assessment and Plan: * Right arm cellulitis Admit to med/surg bed. IV rocephin/vanco. Wound care consult         Olecranon bursitis of right elbow EDP discussed case with Dr. Aundria Rud with orthopedics. EDP felt uncomfortable with draining bursa. Ortho wanted abx and elevation. Ortho was not planning on seeing patient in consult. If bursitis not improving with abx, please formally consult orthopedics.       DNR (do not resuscitate)/DNI(Do Not Intubate) Verified DNR/DNI status with patient. Pt will need yellow DNR form completed prior to discharge. Please take a picture of DNR form and file it into Media tab within Epic for documentation and future use.  Chronic systolic CHF (congestive heart failure) (HCC) Stable. Continue GDMT.  CKD stage 3a, GFR 45-59 ml/min  (HCC) Monitor Scr while on vancomycin therapy.  Anticoagulated on Coumadin Pharmacy to manage coumadin.  S/P TAVR (transcatheter aortic valve replacement) Stable.  Permanent atrial fibrillation (HCC) Stable. On coumadin for CVA prophylaxis.  CAD (coronary artery disease) Stable.  Essential hypertension Stable.   DVT prophylaxis: Coumadin Code Status: DNR/DNI(Do NOT Intubate). Verified code status with patient Family Communication: no family at bedside  Disposition Plan: return to ALF  Consults called: EDP consulted ortho(rogers) who did not feel that pt needed to be seen.  Admission status: Observation, Med-Surg   Carollee Herter, DO [facility] 06/23/2022, 11:43 PM

## 2022-06-23 NOTE — Assessment & Plan Note (Addendum)
Stable. On coumadin for CVA prophylaxis.

## 2022-06-24 ENCOUNTER — Encounter (HOSPITAL_COMMUNITY): Payer: Self-pay | Admitting: Internal Medicine

## 2022-06-24 DIAGNOSIS — M7021 Olecranon bursitis, right elbow: Secondary | ICD-10-CM | POA: Diagnosis not present

## 2022-06-24 DIAGNOSIS — I129 Hypertensive chronic kidney disease with stage 1 through stage 4 chronic kidney disease, or unspecified chronic kidney disease: Secondary | ICD-10-CM | POA: Diagnosis not present

## 2022-06-24 DIAGNOSIS — K7469 Other cirrhosis of liver: Secondary | ICD-10-CM | POA: Diagnosis not present

## 2022-06-24 DIAGNOSIS — E785 Hyperlipidemia, unspecified: Secondary | ICD-10-CM | POA: Diagnosis not present

## 2022-06-24 DIAGNOSIS — L0390 Cellulitis, unspecified: Secondary | ICD-10-CM | POA: Diagnosis not present

## 2022-06-24 DIAGNOSIS — B954 Other streptococcus as the cause of diseases classified elsewhere: Secondary | ICD-10-CM | POA: Diagnosis not present

## 2022-06-24 DIAGNOSIS — E663 Overweight: Secondary | ICD-10-CM | POA: Diagnosis present

## 2022-06-24 DIAGNOSIS — B955 Unspecified streptococcus as the cause of diseases classified elsewhere: Secondary | ICD-10-CM | POA: Diagnosis not present

## 2022-06-24 DIAGNOSIS — Z681 Body mass index (BMI) 19 or less, adult: Secondary | ICD-10-CM | POA: Diagnosis not present

## 2022-06-24 DIAGNOSIS — I4821 Permanent atrial fibrillation: Secondary | ICD-10-CM | POA: Diagnosis present

## 2022-06-24 DIAGNOSIS — N400 Enlarged prostate without lower urinary tract symptoms: Secondary | ICD-10-CM | POA: Diagnosis present

## 2022-06-24 DIAGNOSIS — L8990 Pressure ulcer of unspecified site, unspecified stage: Secondary | ICD-10-CM | POA: Diagnosis present

## 2022-06-24 DIAGNOSIS — D631 Anemia in chronic kidney disease: Secondary | ICD-10-CM | POA: Diagnosis not present

## 2022-06-24 DIAGNOSIS — I350 Nonrheumatic aortic (valve) stenosis: Secondary | ICD-10-CM | POA: Diagnosis present

## 2022-06-24 DIAGNOSIS — I5043 Acute on chronic combined systolic (congestive) and diastolic (congestive) heart failure: Secondary | ICD-10-CM | POA: Diagnosis present

## 2022-06-24 DIAGNOSIS — R1314 Dysphagia, pharyngoesophageal phase: Secondary | ICD-10-CM | POA: Diagnosis not present

## 2022-06-24 DIAGNOSIS — L03113 Cellulitis of right upper limb: Secondary | ICD-10-CM | POA: Diagnosis present

## 2022-06-24 DIAGNOSIS — I5023 Acute on chronic systolic (congestive) heart failure: Secondary | ICD-10-CM | POA: Diagnosis not present

## 2022-06-24 DIAGNOSIS — H3530 Unspecified macular degeneration: Secondary | ICD-10-CM | POA: Diagnosis present

## 2022-06-24 DIAGNOSIS — L89321 Pressure ulcer of left buttock, stage 1: Secondary | ICD-10-CM | POA: Diagnosis present

## 2022-06-24 DIAGNOSIS — I4519 Other right bundle-branch block: Secondary | ICD-10-CM | POA: Diagnosis not present

## 2022-06-24 DIAGNOSIS — Y92129 Unspecified place in nursing home as the place of occurrence of the external cause: Secondary | ICD-10-CM | POA: Diagnosis not present

## 2022-06-24 DIAGNOSIS — K7460 Unspecified cirrhosis of liver: Secondary | ICD-10-CM | POA: Diagnosis present

## 2022-06-24 DIAGNOSIS — S51801A Unspecified open wound of right forearm, initial encounter: Secondary | ICD-10-CM | POA: Diagnosis not present

## 2022-06-24 DIAGNOSIS — M6281 Muscle weakness (generalized): Secondary | ICD-10-CM | POA: Diagnosis not present

## 2022-06-24 DIAGNOSIS — I130 Hypertensive heart and chronic kidney disease with heart failure and stage 1 through stage 4 chronic kidney disease, or unspecified chronic kidney disease: Secondary | ICD-10-CM | POA: Diagnosis present

## 2022-06-24 DIAGNOSIS — R2689 Other abnormalities of gait and mobility: Secondary | ICD-10-CM | POA: Diagnosis not present

## 2022-06-24 DIAGNOSIS — J90 Pleural effusion, not elsewhere classified: Secondary | ICD-10-CM | POA: Diagnosis not present

## 2022-06-24 DIAGNOSIS — I5022 Chronic systolic (congestive) heart failure: Secondary | ICD-10-CM | POA: Diagnosis not present

## 2022-06-24 DIAGNOSIS — Z953 Presence of xenogenic heart valve: Secondary | ICD-10-CM | POA: Diagnosis not present

## 2022-06-24 DIAGNOSIS — K219 Gastro-esophageal reflux disease without esophagitis: Secondary | ICD-10-CM | POA: Diagnosis not present

## 2022-06-24 DIAGNOSIS — N1831 Chronic kidney disease, stage 3a: Secondary | ICD-10-CM | POA: Diagnosis present

## 2022-06-24 DIAGNOSIS — I2510 Atherosclerotic heart disease of native coronary artery without angina pectoris: Secondary | ICD-10-CM | POA: Diagnosis present

## 2022-06-24 DIAGNOSIS — R7881 Bacteremia: Secondary | ICD-10-CM | POA: Diagnosis present

## 2022-06-24 DIAGNOSIS — I272 Pulmonary hypertension, unspecified: Secondary | ICD-10-CM | POA: Diagnosis present

## 2022-06-24 DIAGNOSIS — I872 Venous insufficiency (chronic) (peripheral): Secondary | ICD-10-CM | POA: Diagnosis not present

## 2022-06-24 DIAGNOSIS — Z6826 Body mass index (BMI) 26.0-26.9, adult: Secondary | ICD-10-CM | POA: Diagnosis not present

## 2022-06-24 DIAGNOSIS — W19XXXD Unspecified fall, subsequent encounter: Secondary | ICD-10-CM | POA: Diagnosis not present

## 2022-06-24 DIAGNOSIS — L89311 Pressure ulcer of right buttock, stage 1: Secondary | ICD-10-CM | POA: Diagnosis present

## 2022-06-24 DIAGNOSIS — I739 Peripheral vascular disease, unspecified: Secondary | ICD-10-CM | POA: Diagnosis present

## 2022-06-24 DIAGNOSIS — F419 Anxiety disorder, unspecified: Secondary | ICD-10-CM | POA: Diagnosis present

## 2022-06-24 DIAGNOSIS — B950 Streptococcus, group A, as the cause of diseases classified elsewhere: Secondary | ICD-10-CM | POA: Diagnosis present

## 2022-06-24 DIAGNOSIS — R531 Weakness: Secondary | ICD-10-CM | POA: Diagnosis not present

## 2022-06-24 DIAGNOSIS — W19XXXA Unspecified fall, initial encounter: Secondary | ICD-10-CM | POA: Diagnosis present

## 2022-06-24 DIAGNOSIS — E7800 Pure hypercholesterolemia, unspecified: Secondary | ICD-10-CM | POA: Diagnosis present

## 2022-06-24 DIAGNOSIS — Z66 Do not resuscitate: Secondary | ICD-10-CM | POA: Diagnosis present

## 2022-06-24 DIAGNOSIS — I959 Hypotension, unspecified: Secondary | ICD-10-CM | POA: Diagnosis not present

## 2022-06-24 DIAGNOSIS — K5900 Constipation, unspecified: Secondary | ICD-10-CM | POA: Diagnosis present

## 2022-06-24 LAB — COMPREHENSIVE METABOLIC PANEL
ALT: 15 U/L (ref 0–44)
AST: 17 U/L (ref 15–41)
Albumin: 2.4 g/dL — ABNORMAL LOW (ref 3.5–5.0)
Alkaline Phosphatase: 105 U/L (ref 38–126)
Anion gap: 9 (ref 5–15)
BUN: 37 mg/dL — ABNORMAL HIGH (ref 8–23)
CO2: 25 mmol/L (ref 22–32)
Calcium: 8.5 mg/dL — ABNORMAL LOW (ref 8.9–10.3)
Chloride: 101 mmol/L (ref 98–111)
Creatinine, Ser: 1.43 mg/dL — ABNORMAL HIGH (ref 0.61–1.24)
GFR, Estimated: 45 mL/min — ABNORMAL LOW (ref >60)
Glucose, Bld: 122 mg/dL — ABNORMAL HIGH (ref 70–99)
Potassium: 4.3 mmol/L (ref 3.5–5.1)
Sodium: 135 mmol/L (ref 135–145)
Total Bilirubin: 1.3 mg/dL — ABNORMAL HIGH (ref 0.3–1.2)
Total Protein: 6.8 g/dL (ref 6.5–8.1)

## 2022-06-24 LAB — BLOOD CULTURE ID PANEL (REFLEXED) - BCID2

## 2022-06-24 LAB — CBC WITH DIFFERENTIAL/PLATELET
Abs Immature Granulocytes: 0.39 10*3/uL — ABNORMAL HIGH (ref 0.00–0.07)
Basophils Absolute: 0.1 10*3/uL (ref 0.0–0.1)
Basophils Relative: 0 %
Eosinophils Absolute: 0 10*3/uL (ref 0.0–0.5)
Eosinophils Relative: 0 %
HCT: 34.7 % — ABNORMAL LOW (ref 39.0–52.0)
Hemoglobin: 10.9 g/dL — ABNORMAL LOW (ref 13.0–17.0)
Immature Granulocytes: 2 %
Lymphocytes Relative: 3 %
Lymphs Abs: 0.7 10*3/uL (ref 0.7–4.0)
MCH: 30.1 pg (ref 26.0–34.0)
MCHC: 31.4 g/dL (ref 30.0–36.0)
MCV: 95.9 fL (ref 80.0–100.0)
Monocytes Absolute: 1 10*3/uL (ref 0.1–1.0)
Monocytes Relative: 5 %
Neutro Abs: 18.4 10*3/uL — ABNORMAL HIGH (ref 1.7–7.7)
Neutrophils Relative %: 90 %
Platelets: 167 10*3/uL (ref 150–400)
RBC: 3.62 MIL/uL — ABNORMAL LOW (ref 4.22–5.81)
RDW: 16.2 % — ABNORMAL HIGH (ref 11.5–15.5)
WBC: 20.5 10*3/uL — ABNORMAL HIGH (ref 4.0–10.5)
nRBC: 0 % (ref 0.0–0.2)

## 2022-06-24 LAB — MAGNESIUM: Magnesium: 2.4 mg/dL (ref 1.7–2.4)

## 2022-06-24 LAB — MRSA NEXT GEN BY PCR, NASAL: MRSA by PCR Next Gen: DETECTED — AB

## 2022-06-24 LAB — PROTIME-INR
INR: 3.7 — ABNORMAL HIGH (ref 0.8–1.2)
INR: 4.1 (ref 0.8–1.2)
Prothrombin Time: 37.2 seconds — ABNORMAL HIGH (ref 11.4–15.2)
Prothrombin Time: 39.9 seconds — ABNORMAL HIGH (ref 11.4–15.2)

## 2022-06-24 MED ORDER — ONDANSETRON HCL 4 MG/2ML IJ SOLN: 4.0000 mg | Freq: Four times a day (QID) | INTRAMUSCULAR | Status: DC | PRN

## 2022-06-24 MED ORDER — MELATONIN 5 MG PO TABS
10.0000 mg | ORAL_TABLET | Freq: Every evening | ORAL | Status: DC | PRN
  Administered 2022-06-29: 10 mg via ORAL
  Filled 2022-06-24: qty 2

## 2022-06-24 MED ORDER — ONDANSETRON HCL 4 MG PO TABS: 4.0000 mg | ORAL_TABLET | Freq: Four times a day (QID) | ORAL | Status: DC | PRN

## 2022-06-24 MED ORDER — LACTATED RINGERS IV BOLUS
500.0000 mL | Freq: Once | INTRAVENOUS | Status: AC
  Administered 2022-06-24: 500 mL via INTRAVENOUS

## 2022-06-24 MED ORDER — POLYETHYLENE GLYCOL 3350 17 G PO PACK
17.0000 g | PACK | ORAL | Status: DC
  Administered 2022-06-25 – 2022-06-29 (×3): 17 g via ORAL
  Filled 2022-06-24 (×3): qty 1

## 2022-06-24 MED ORDER — VANCOMYCIN HCL 1250 MG/250ML IV SOLN: 1250.0000 mg | INTRAVENOUS | Status: DC

## 2022-06-24 MED ORDER — PROSIGHT PO TABS
1.0000 | ORAL_TABLET | Freq: Every day | ORAL | Status: DC
  Administered 2022-06-24 – 2022-06-30 (×7): 1 via ORAL
  Filled 2022-06-24 (×7): qty 1

## 2022-06-24 MED ORDER — OXYCODONE HCL 5 MG PO TABS
5.0000 mg | ORAL_TABLET | ORAL | Status: DC | PRN
  Administered 2022-06-24 – 2022-06-30 (×9): 5 mg via ORAL
  Filled 2022-06-24 (×9): qty 1

## 2022-06-24 MED ORDER — ACETAMINOPHEN 325 MG PO TABS: 650.0000 mg | ORAL_TABLET | Freq: Four times a day (QID) | ORAL | Status: DC | PRN

## 2022-06-24 MED ORDER — CEFAZOLIN SODIUM-DEXTROSE 2-4 GM/100ML-% IV SOLN
2.0000 g | Freq: Three times a day (TID) | INTRAVENOUS | Status: DC
  Administered 2022-06-24 – 2022-06-30 (×19): 2 g via INTRAVENOUS
  Filled 2022-06-24 (×20): qty 100

## 2022-06-24 MED ORDER — POLYETHYLENE GLYCOL 3350 17 G PO PACK: 17.0000 g | PACK | Freq: Every day | ORAL | Status: DC | PRN

## 2022-06-24 MED ORDER — SODIUM CHLORIDE 0.9 % IV SOLN
1.0000 g | INTRAVENOUS | Status: DC
  Administered 2022-06-24: 1 g via INTRAVENOUS
  Filled 2022-06-24: qty 10

## 2022-06-24 MED ORDER — ACETAMINOPHEN 650 MG RE SUPP: 650.0000 mg | Freq: Four times a day (QID) | RECTAL | Status: DC | PRN

## 2022-06-24 MED ORDER — SODIUM CHLORIDE 0.9 % IV SOLN
2.0000 g | INTRAVENOUS | Status: DC
  Filled 2022-06-24: qty 20

## 2022-06-24 NOTE — Consult Note (Addendum)
WOC Nurse Consult Note: Reason for Consult:full thickness wounds to right upper extremity with edema and erythema, partial thickness wounds to bilateral LEs with edema (R>L). Photodocumentation of lesions appreciated. Wound type:infectious, venous insufficiency Pressure Injury POA: Yes, stage 1 noted to buttocks on admission Measurement:To be measured and documented on nursing flow sheet with application of next dressings today Wound bed:red, dry Drainage (amount, consistency, odor) none Periwound:dry Dressing procedure/placement/frequency:I have provided nursing with conservative care guidance for topical care using cleansing followed by covering the lesions with folded layers of antimicrobial, nonadherent (xeroform) gauze topped with dry gauze, and ABD pad to the upper extremity for comfort and securing with Kerlix roll gauze/paper tape. The right arm is to be elevated on a pillow for edema and comfort. Turning and repositioning is in place and time in the supine position is to be limited. A pressure redistribution chair pad is provided for OOB use and is to be sent with patient at time of discharge.  WOC nursing team will not follow, but will remain available to this patient, the nursing and medical teams.  Please re-consult if needed.  Thank you for inviting [place] to participate in this patient's Plan of Care.  Ladona Mow, MSN, RN, CNS, GNP, Leda Min, [employer], [employer] phone:  [phone]

## 2022-06-24 NOTE — Assessment & Plan Note (Signed)
-  Continue home lactulose, Miralax

## 2022-06-24 NOTE — Progress Notes (Signed)
ANTICOAGULATION CONSULT NOTE - Initial Consult  Pharmacy Consult for Warfarin Indication: atrial fibrillation  No Known Allergies  Patient Measurements: Height: 5\' 8"  (172.7 cm) Weight: 80 kg (176 lb 5.9 oz) IBW/kg (Calculated) : 68.4    Vital Signs: Temp: 98.5 F (36.9 C) (05/10 2251) Temp Source: Oral (05/10 2251) BP: 114/52 (05/10 2252) Pulse Rate: 71 (05/10 2252)  Labs: Recent Labs    06/23/22 1734 06/23/22 2359  HGB 11.1*  --   HCT 35.4*  --   PLT 186  --   LABPROT  --  39.9*  INR  --  4.1*  CREATININE 1.34*  --     Estimated Creatinine Clearance: 32.6 mL/min (A) (by C-G formula based on SCr of 1.34 mg/dL (H)).   Medical History: Past Medical History:  Diagnosis Date   AAA (abdominal aortic aneurysm) (HCC)    s/p repair   Acute on chronic systolic (congestive) heart failure (HCC) 12/22/2021   Aortic atherosclerosis (HCC)    Arthritis    knees   CAD (coronary artery disease)    a. s/p POBA 1980s, 1990s (details unclear). b. known stenotic RCA (previous unsuccessful intervention)   Cancer (HCC)    skin cancer removed, right hand, left leg, chest   Carotid artery disease (HCC)    Cholecystitis, acute 12/06/2012   Chronic anticoagulation    Coumadin   Chronic kidney disease, stage 3a (HCC)    Cirrhosis of liver (HCC)    by imaging   Coronary artery disease    GERD (gastroesophageal reflux disease)    Heart failure with mildly reduced ejection fraction (HFmrEF) (HCC)    History of pulmonary embolism    1964 related to dislocated hip on right    History of skin cancer    Hypercholesteremia    Hypertension    Incidental cecal mass noted on CT imaging 05/24/2016   **An incidental finding of potential clinical significance has been found. 4.6 x 5.6 x 3.7 cm masslike lesion in the cecum adjacent to the ileocecal valve may represent a large polyp or colonic neoplasm. 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  On Warfarin PTA for h/p AFib, s/p TAVR, HTN, CKD, HF INR on admission = 4.1 (supratherapeutic)  Goal of Therapy:  INR 2-3 Monitor platelets by anticoagulation protocol: Yes   Plan:  No additional warfarin tonight Daily PT/INR  Kevina Piloto, Joselyn Glassman, PharmD 06/24/2022,12:33 AM

## 2022-06-24 NOTE — Progress Notes (Signed)
Pharmacy Antibiotic Note  Tyler Williams is a 89 y.o. male admitted on 06/23/2022 with cellulitis.  Patient received Vancomycin 1gm IV x 1 dose in the ED.  Pharmacy has been consulted for Vancomycin dosing.  Plan: Vancomycin 1250 mg IV Q 36 hrs. Goal AUC 400-550. Expected AUC: 459.8 SCr used: 1.34 Follow renal function F/u culture results and sensivitivies   Height: 5\' 8"  (172.7 cm) Weight: 80 kg (176 lb 5.9 oz) IBW/kg (Calculated) : 68.4  Temp (24hrs), Avg:99.7 F (37.6 C), Min:98.5 F (36.9 C), Max:101.6 F (38.7 C)  Recent Labs  Lab 06/23/22 1734  WBC 15.1*  CREATININE 1.34*  LATICACIDVEN 1.2    Estimated Creatinine Clearance: 32.6 mL/min (A) (by C-G formula based on SCr of 1.34 mg/dL (H)).    No Known Allergies  Antimicrobials this admission: 5/9 Vancomycin >>      Dose adjustments this admission:    Microbiology results: 5/9 BCx:      Thank you for allowing pharmacy to be a part of this patient's care.  Maryellen Pile, PharmD 06/24/2022 12:01 AM

## 2022-06-24 NOTE — Assessment & Plan Note (Addendum)
-  Significantly visually impaired -Continue preservision

## 2022-06-24 NOTE — Assessment & Plan Note (Signed)
-  R forearm skin tear Pressure Injury 06/24/22 Buttocks Right;Left Stage 1 -  Intact skin with non-blanchable redness of a localized area usually over a bony prominence. redness (Active)  06/24/22 0354  Location: Buttocks  Location Orientation: Right;Left  Staging: Stage 1 -  Intact skin with non-blanchable redness of a localized area usually over a bony prominence.  Wound Description (Comments): redness  Present on Admission: Yes  -Wound care consulted

## 2022-06-24 NOTE — Plan of Care (Signed)
  Problem: Skin Integrity: Goal: Skin integrity will improve Outcome: Progressing   Problem: Education: Goal: Knowledge of General Education information will improve Description: Including pain rating scale, medication(s)/side effects and non-pharmacologic comfort measures Outcome: Progressing   Problem: Activity: Goal: Risk for activity intolerance will decrease Outcome: Progressing   Problem: Nutrition: Goal: Adequate nutrition will be maintained Outcome: Progressing   Problem: Pain Managment: Goal: General experience of comfort will improve Outcome: Progressing   Problem: Safety: Goal: Ability to remain free from injury will improve Outcome: Progressing   Problem: Skin Integrity: Goal: Risk for impaired skin integrity will decrease Outcome: Progressing

## 2022-06-24 NOTE — Progress Notes (Addendum)
PHARMACY - PHYSICIAN COMMUNICATION CRITICAL VALUE ALERT - BLOOD CULTURE IDENTIFICATION (BCID)  Tyler Williams is an 89 y.o. male who presented to [facility] on 06/23/2022 with a chief complaint of Fall  Assessment:   fall with skin tear now with 4/4 [facility] with Group A Strep  Name of physician (or Provider) Contacted: Yates  Current antibiotics: Vanco, Rocephin  Changes to prescribed antibiotics recommended:  Con't Vancomycin until MRSA PCR results (was + Oct 2023) Increase Rocephin to 2g IV q24h  Results for orders placed or performed during the hospital encounter of 06/23/22  Blood Culture ID Panel (Reflexed) (Collected: 06/23/2022  5:34 PM)  Result Value Ref Range   Enterococcus faecalis NOT DETECTED NOT DETECTED   Enterococcus Faecium NOT DETECTED NOT DETECTED   Listeria monocytogenes NOT DETECTED NOT DETECTED   Staphylococcus species NOT DETECTED NOT DETECTED   Staphylococcus aureus (BCID) NOT DETECTED NOT DETECTED   Staphylococcus epidermidis NOT DETECTED NOT DETECTED   Staphylococcus lugdunensis NOT DETECTED NOT DETECTED   Streptococcus species DETECTED (A) NOT DETECTED   Streptococcus agalactiae NOT DETECTED NOT DETECTED   Streptococcus pneumoniae NOT DETECTED NOT DETECTED   Streptococcus pyogenes DETECTED (A) NOT DETECTED   A.calcoaceticus-baumannii NOT DETECTED NOT DETECTED   Bacteroides fragilis NOT DETECTED NOT DETECTED   Enterobacterales NOT DETECTED NOT DETECTED   Enterobacter cloacae complex NOT DETECTED NOT DETECTED   Escherichia coli NOT DETECTED NOT DETECTED   Klebsiella aerogenes NOT DETECTED NOT DETECTED   Klebsiella oxytoca NOT DETECTED NOT DETECTED   Klebsiella pneumoniae NOT DETECTED NOT DETECTED   Proteus species NOT DETECTED NOT DETECTED   Salmonella species NOT DETECTED NOT DETECTED   Serratia marcescens NOT DETECTED NOT DETECTED   Haemophilus influenzae NOT DETECTED NOT DETECTED   Neisseria meningitidis NOT DETECTED NOT DETECTED   Pseudomonas  aeruginosa NOT DETECTED NOT DETECTED   Stenotrophomonas maltophilia NOT DETECTED NOT DETECTED   Candida albicans NOT DETECTED NOT DETECTED   Candida auris NOT DETECTED NOT DETECTED   Candida glabrata NOT DETECTED NOT DETECTED   Candida krusei NOT DETECTED NOT DETECTED   Candida parapsilosis NOT DETECTED NOT DETECTED   Candida tropicalis NOT DETECTED NOT DETECTED   Cryptococcus neoformans/gattii NOT DETECTED NOT DETECTED    Jordana Dugue S. Merilynn Finland, PharmD, BCPS Clinical Staff Pharmacist [email or web address] Pasty Spillers 06/24/2022  12:16 PM

## 2022-06-24 NOTE — Consult Note (Signed)
Reason for Consult: Right forearm full-thickness skin loss/abrasion with cellulitis Referring Physician: Dr. Jonah Blue  HPI: Tyler Williams is an 89 y.o. male resident at a local assisted living who was being assisted and states he lost his balance and was about to fall when the nurse grabbed him by his forearm.  He unfortunately had a full-thickness skin loss.  He has had some progressive fevers that developed over the past 24 to 48 hours prompting emergency room visit and admission.  A CT scan was ordered along with blood work including blood cultures and a CT scan showed diffuse subcutaneous edema but no abscess.  There was some distention of the olecranon bursa.  His white count has continued to trend upward.  He was initially on Rocephin and vancomycin.  Dr. Ophelia Charter did speak with infectious disease and now with positive blood culture for strep they had recommended Ancef.  He has been noted to be afebrile today.  Past Medical History:  Diagnosis Date   AAA (abdominal aortic aneurysm) (HCC)    s/p repair   Acute on chronic systolic (congestive) heart failure (HCC) 12/22/2021   Aortic atherosclerosis (HCC)    Arthritis    knees   CAD (coronary artery disease)    a. s/p POBA 1980s, 1990s (details unclear). b. known stenotic RCA (previous unsuccessful intervention)   Cancer (HCC)    skin cancer removed, right hand, left leg, chest   Carotid artery disease (HCC)    Cholecystitis, acute 12/06/2012   Chronic anticoagulation    Coumadin   Chronic kidney disease, stage 3a (HCC)    Cirrhosis of liver (HCC)    by imaging   Coronary artery disease    GERD (gastroesophageal reflux disease)    Heart failure with mildly reduced ejection fraction (HFmrEF) (HCC)    History of pulmonary embolism    1964 related to dislocated hip on right    History of skin cancer    Hypercholesteremia    Hypertension    Incidental cecal mass noted on CT imaging 05/24/2016   **An incidental finding of  potential clinical significance has been found. 4.6 x 5.6 x 3.7 cm masslike lesion in the cecum adjacent to the ileocecal valve may represent a large polyp or colonic neoplasm. 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 Unable to pass wire past RCA lesion->medically managed   CHOLECYSTECTOMY N/A 12/05/2012   Procedure: LAPAROSCOPIC CHOLECYSTECTOMY ;  Surgeon: Mariella Saa, MD;  Location: [facility] OR;  Service: General;  Laterality:  N/A;   CHOLECYSTECTOMY     CORONARY ANGIOPLASTY  1980, 1990   CORONARY BALLOON ANGIOPLASTY N/A 08/31/2017   Procedure: CORONARY BALLOON ANGIOPLASTY;  Surgeon: Swaziland, Peter M, MD;  Location: [facility] INVASIVE CV LAB;  Service: Cardiovascular;  Laterality: N/A;   EYE SURGERY     hx of cataract surgery    HEMORRHOID SURGERY     1973   HERNIA REPAIR     right inguinal hernia repair    JOINT REPLACEMENT     left knee replacement, partial right   LEFT HEART CATH AND CORONARY ANGIOGRAPHY N/A 08/31/2017   Procedure: LEFT HEART CATH AND CORONARY ANGIOGRAPHY;   Surgeon: Swaziland, Peter M, MD;  Location: [facility] INVASIVE CV LAB;  Service: Cardiovascular;  Laterality: N/A;   LEFT HEART CATHETERIZATION WITH CORONARY ANGIOGRAM N/A 05/24/2012   Procedure: LEFT HEART CATHETERIZATION WITH CORONARY ANGIOGRAM;  Surgeon: Kathleene Hazel, MD;  Location: [facility] CATH LAB;  Service: Cardiovascular;  Laterality: N/A;   ORTHOPEDIC SURGERY     OTHER SURGICAL HISTORY     right knee popliteal aneurysm surgery stent placed    OTHER SURGICAL HISTORY     PARTIAL KNEE ARTHROPLASTY  01/22/2012   Procedure: UNICOMPARTMENTAL KNEE;  Surgeon: [name], MD;  Location: [facility] ORS;  Service: Orthopedics;  Laterality: Right;   PERCUTANEOUS CORONARY INTERVENTION-BALLOON ONLY  05/24/2012   Procedure: PERCUTANEOUS CORONARY INTERVENTION-BALLOON ONLY;  Surgeon: Kathleene Hazel, MD;  Location: [facility] CATH LAB;  Service: Cardiovascular;;   RIGHT/LEFT HEART CATH AND CORONARY ANGIOGRAPHY N/A 05/12/2016   Procedure: Right/Left Heart Cath and Coronary Angiography;  Surgeon: Tonny Bollman, MD;  Location: [facility] INVASIVE CV LAB;  Service: Cardiovascular;  Laterality: N/A;   TEE WITHOUT CARDIOVERSION N/A 06/13/2016   Procedure: TRANSESOPHAGEAL ECHOCARDIOGRAM (TEE);  Surgeon: Tonny Bollman, MD;  Location: [facility] OR;  Service: Open Heart Surgery;  Laterality: N/A;   TONSILLECTOMY     TOTAL KNEE ARTHROPLASTY Left    TRANSCATHETER AORTIC VALVE REPLACEMENT, TRANSFEMORAL N/A 06/13/2016   Procedure: TRANSCATHETER AORTIC VALVE REPLACEMENT, TRANSFEMORAL;  Surgeon: Tonny Bollman, MD;  Location: [facility] OR;  Service: Open Heart Surgery;  Laterality: N/A;    Family History  Problem Relation Age of Onset   Heart attack Mother 50    Social History:  reports that he has never smoked. He has never used smokeless tobacco. He reports that he does not currently use alcohol. He reports that he does not use drugs.  Allergies: No Known Allergies  Medications: I have reviewed the patient's current medications.  Results for  orders placed or performed during the hospital encounter of 06/23/22 (from the past 48 hour(s))  CBC with Differential/Platelet     Status: Abnormal   Collection Time: 06/23/22  5:34 PM  Result Value Ref Range   WBC 15.1 (H) 4.0 - 10.5 K/uL   RBC 3.73 (L) 4.22 - 5.81 MIL/uL   Hemoglobin 11.1 (L) 13.0 - 17.0 g/dL   HCT 60.4 (L) 54.0 - 98.1 %   MCV 94.9 80.0 - 100.0 fL   MCH 29.8 26.0 - 34.0 pg   MCHC 31.4 30.0 - 36.0 g/dL   RDW 19.1 (H) 47.8 - 29.5 %   Platelets 186 150 - 400 K/uL   nRBC 0.0 0.0 - 0.2 %   Neutrophils Relative % 89 %   Neutro Abs 13.5 (H) 1.7 - 7.7 K/uL   Lymphocytes Relative 3 %   Lymphs Abs 0.4 (L) 0.7 - 4.0 K/uL   Monocytes Relative 7 %   Monocytes  Absolute 1.1 (H) 0.1 - 1.0 K/uL   Eosinophils Relative 0 %   Eosinophils Absolute 0.0 0.0 - 0.5 K/uL   Basophils Relative 0 %   Basophils Absolute 0.1 0.0 - 0.1 K/uL   Immature Granulocytes 1 %   Abs Immature Granulocytes 0.07 0.00 - 0.07 K/uL    Comment: Performed at [facility], [address], [place], [place] [address]  Basic metabolic panel     Status: Abnormal   Collection Time: 06/23/22  5:34 PM  Result Value Ref Range   Sodium 136 135 - 145 mmol/L   Potassium 4.5 3.5 - 5.1 mmol/L   Chloride 104 98 - 111 mmol/L   CO2 23 22 - 32 mmol/L   Glucose, Bld 99 70 - 99 mg/dL    Comment: Glucose reference range applies only to samples taken after fasting for at least 8 hours.   BUN 40 (H) 8 - 23 mg/dL   Creatinine, Ser 6.04 (H) 0.61 - 1.24 mg/dL   Calcium 8.7 (L) 8.9 - 10.3 mg/dL   GFR, Estimated 49 (L) >60 mL/min    Comment: (NOTE) Calculated using the CKD-EPI Creatinine Equation (2021)    Anion gap 9 5 - 15    Comment: Performed at [facility], [address], [place], [place] [address]  Lactic acid, plasma     Status: None   Collection Time: 06/23/22  5:34 PM  Result Value Ref Range   Lactic Acid, Venous 1.2 0.5 - 1.9 mmol/L    Comment: Performed at [facility], [address], [place], [place] [address]  Culture, blood (Routine X 2) w Reflex to ID Panel     Status: None (Preliminary result)   Collection Time: 06/23/22  5:34 PM   Specimen: BLOOD  Result Value Ref Range   Specimen Description      BLOOD RIGHT ANTECUBITAL Performed at [facility], [address], [place], [place] [address]    Special Requests      BOTTLES DRAWN AEROBIC AND ANAEROBIC Blood Culture results may not be optimal due to an inadequate volume of blood received in culture bottles Performed at [facility], [address], [place], [place] [address]    Culture  Setup Time      GRAM POSITIVE COCCI IN CHAINS IN BOTH AEROBIC AND ANAEROBIC BOTTLES CRITICAL RESULT CALLED TO, READ BACK BY AND VERIFIED WITH: PHARMD CRYSTAL R. [id number] FCP Performed at [facility], [address], [place], [place] [address]    Culture GRAM POSITIVE COCCI    Report Status PENDING   Culture, blood (Routine X 2) w Reflex to ID Panel     Status: None (Preliminary result)   Collection Time: 06/23/22  5:34 PM   Specimen: BLOOD LEFT FOREARM  Result Value Ref Range   Specimen Description      BLOOD LEFT FOREARM Performed at [facility], [address] [place], [place] [address]    Special Requests      BOTTLES DRAWN AEROBIC AND ANAEROBIC Blood Culture adequate volume Performed at [facility], [address], [place], [place] [address]    Culture  Setup Time      GRAM POSITIVE COCCI IN CHAINS IN BOTH AEROBIC AND ANAEROBIC BOTTLES Performed at [facility], [address] [place], [place] [address]    Culture PENDING    Report Status PENDING   Blood Culture ID Panel (Reflexed)  Status: Abnormal   Collection Time: 06/23/22  5:34 PM  Result Value Ref Range   Enterococcus faecalis NOT DETECTED NOT DETECTED   Enterococcus Faecium NOT DETECTED NOT DETECTED   Listeria monocytogenes NOT  DETECTED NOT DETECTED   Staphylococcus species NOT DETECTED NOT DETECTED   Staphylococcus aureus (BCID) NOT DETECTED NOT DETECTED   Staphylococcus epidermidis NOT DETECTED NOT DETECTED   Staphylococcus lugdunensis NOT DETECTED NOT DETECTED   Streptococcus species DETECTED (A) NOT DETECTED    Comment: CRITICAL RESULT CALLED TO, READ BACK BY AND VERIFIED WITH: PHARMD CRYSTAL R. [id number] FCP    Streptococcus agalactiae NOT DETECTED NOT DETECTED   Streptococcus pneumoniae NOT DETECTED NOT DETECTED   Streptococcus pyogenes DETECTED (A) NOT DETECTED    Comment: CRITICAL RESULT CALLED TO, READ BACK BY AND VERIFIED WITH: PHARMD CRYSTAL R. [id number] FCP    A.calcoaceticus-baumannii NOT DETECTED NOT DETECTED   Bacteroides fragilis NOT DETECTED NOT DETECTED   Enterobacterales NOT DETECTED NOT DETECTED   Enterobacter cloacae complex NOT DETECTED NOT DETECTED   Escherichia coli NOT DETECTED NOT DETECTED   Klebsiella aerogenes NOT DETECTED NOT DETECTED   Klebsiella oxytoca NOT DETECTED NOT DETECTED   Klebsiella pneumoniae NOT DETECTED NOT DETECTED   Proteus species NOT DETECTED NOT DETECTED   Salmonella species NOT DETECTED NOT DETECTED   Serratia marcescens NOT DETECTED NOT DETECTED   Haemophilus influenzae NOT DETECTED NOT DETECTED   Neisseria meningitidis NOT DETECTED NOT DETECTED   Pseudomonas aeruginosa NOT DETECTED NOT DETECTED   Stenotrophomonas maltophilia NOT DETECTED NOT DETECTED   Candida albicans NOT DETECTED NOT DETECTED   Candida auris NOT DETECTED NOT DETECTED   Candida glabrata NOT DETECTED NOT DETECTED   Candida krusei NOT DETECTED NOT DETECTED   Candida parapsilosis NOT DETECTED NOT DETECTED   Candida tropicalis NOT DETECTED NOT DETECTED   Cryptococcus neoformans/gattii NOT DETECTED NOT DETECTED    Comment: Performed at [facility], [address], [place], [place] [address]  Protime-INR     Status: Abnormal   Collection Time: 06/23/22 11:59 PM  Result Value  Ref Range   Prothrombin Time 39.9 (H) 11.4 - 15.2 seconds   INR 4.1 (HH) 0.8 - 1.2    Comment: CRITICAL RESULT CALLED TO, READ BACK BY AND VERIFIED WITH: sierra gray, rn [id number] mh (NOTE) INR goal varies based on device and disease states. Performed at [facility], [address] [place], [place] [address]   Protime-INR     Status: Abnormal   Collection Time: 06/24/22  4:12 AM  Result Value Ref Range   Prothrombin Time 37.2 (H) 11.4 - 15.2 seconds   INR 3.7 (H) 0.8 - 1.2    Comment: (NOTE) INR goal varies based on device and disease states. Performed at [facility], [address] [place], [place] [address]   Comprehensive metabolic panel     Status: Abnormal   Collection Time: 06/24/22  4:12 AM  Result Value Ref Range   Sodium 135 135 - 145 mmol/L   Potassium 4.3 3.5 - 5.1 mmol/L   Chloride 101 98 - 111 mmol/L   CO2 25 22 - 32 mmol/L   Glucose, Bld 122 (H) 70 - 99 mg/dL    Comment: Glucose reference range applies only to samples taken after fasting for at least 8 hours.   BUN 37 (H) 8 - 23 mg/dL   Creatinine, Ser 0.86 (H) 0.61 - 1.24 mg/dL   Calcium 8.5 (L) 8.9 - 10.3 mg/dL  Total Protein 6.8 6.5 - 8.1 g/dL   Albumin 2.4 (L) 3.5 - 5.0 g/dL   AST 17 15 - 41 U/L   ALT 15 0 - 44 U/L   Alkaline Phosphatase 105 38 - 126 U/L   Total Bilirubin 1.3 (H) 0.3 - 1.2 mg/dL   GFR, Estimated 45 (L) >60 mL/min    Comment: (NOTE) Calculated using the CKD-EPI Creatinine Equation (2021)    Anion gap 9 5 - 15    Comment: Performed at [facility], [address] [place], [place], [place] [address]  CBC with Differential/Platelet     Status: Abnormal   Collection Time: 06/24/22  4:12 AM  Result Value Ref Range   WBC 20.5 (H) 4.0 - 10.5 K/uL   RBC 3.62 (L) 4.22 - 5.81 MIL/uL   Hemoglobin 10.9 (L) 13.0 - 17.0 g/dL   HCT 60.4 (L) 54.0 - 98.1 %   MCV 95.9 80.0 - 100.0 fL   MCH 30.1 26.0 - 34.0 pg   MCHC 31.4 30.0 - 36.0 g/dL   RDW 19.1  (H) 47.8 - 15.5 %   Platelets 167 150 - 400 K/uL   nRBC 0.0 0.0 - 0.2 %   Neutrophils Relative % 90 %   Neutro Abs 18.4 (H) 1.7 - 7.7 K/uL   Lymphocytes Relative 3 %   Lymphs Abs 0.7 0.7 - 4.0 K/uL   Monocytes Relative 5 %   Monocytes Absolute 1.0 0.1 - 1.0 K/uL   Eosinophils Relative 0 %   Eosinophils Absolute 0.0 0.0 - 0.5 K/uL   Basophils Relative 0 %   Basophils Absolute 0.1 0.0 - 0.1 K/uL   Immature Granulocytes 2 %   Abs Immature Granulocytes 0.39 (H) 0.00 - 0.07 K/uL    Comment: Performed at [facility], [address], [place], [place] [address]  Magnesium     Status: None   Collection Time: 06/24/22  4:12 AM  Result Value Ref Range   Magnesium 2.4 1.7 - 2.4 mg/dL    Comment: Performed at [facility], [address] [place], [place] [address]    CT FOREARM RIGHT W CONTRAST  Result Date: 06/23/2022 CLINICAL DATA:  Soft tissue mass, deep wrist with redness up to mid humerus. Four day history of right arm pain after fall with laceration to skin. EXAM: CT OF THE UPPER RIGHT EXTREMITY WITH CONTRAST TECHNIQUE: Multidetector CT imaging of the upper right extremity was performed according to the standard protocol following intravenous contrast administration. RADIATION DOSE REDUCTION: This exam was performed according to the departmental dose-optimization program which includes automated exposure control, adjustment of the mA and/or kV according to patient size and/or use of iterative reconstruction technique. CONTRAST:  OMNIPAQUE IOHEXOL 300 MG/ML  SOLN COMPARISON:  None Available. FINDINGS: Bones/Joint/Cartilage Osteopenia is noted. No acute fracture or dislocation is seen. Degenerative changes are present at the elbow and wrist. There is no joint effusion at the elbow Ligaments Suboptimally assessed by CT. Muscles and Tendons Within normal limits. Soft tissues Vascular calcifications are noted in the soft tissues. There is diffuse  subcutaneous edema in the right forearm. There is a circumscribed hypodense collection posterior to the olecranon measuring 4.7 x 1.8 cm, suggesting distended bursa. No rim enhancing fluid collection or mass is seen. No subcutaneous emphysema. IMPRESSION: 1. No acute fracture or dislocation. 2. Extensive subcutaneous edema.  No abscess is seen. 3. Distention of the olecranon bursa, suggesting bursitis. No mass is identified. 4. Degenerative changes at  the wrist and elbow. Electronically Signed   By: Thornell Sartorius M.D.   On: 06/23/2022 21:33   DG Forearm Right  Result Date: 06/23/2022 CLINICAL DATA:  Four day history of right arm pain after fall and laceration to the skin EXAM: RIGHT FOREARM - 2 VIEW; RIGHT WRIST - COMPLETE 4 VIEW COMPARISON:  None Available. FINDINGS: There is no evidence of fracture or other focal bone lesions. Soft tissues are unremarkable. Diffuse degenerative changes of the partially imaged hand and elbow. IMPRESSION: No acute displaced fracture or dislocation. No radiopaque foreign bodies. Electronically Signed   By: Agustin Cree M.D.   On: 06/23/2022 17:54   DG Wrist Complete Right  Result Date: 06/23/2022 CLINICAL DATA:  Four day history of right arm pain after fall and laceration to the skin EXAM: RIGHT FOREARM - 2 VIEW; RIGHT WRIST - COMPLETE 4 VIEW COMPARISON:  None Available. FINDINGS: There is no evidence of fracture or other focal bone lesions. Soft tissues are unremarkable. Diffuse degenerative changes of the partially imaged hand and elbow. IMPRESSION: No acute displaced fracture or dislocation. No radiopaque foreign bodies. Electronically Signed   By: Agustin Cree M.D.   On: 06/23/2022 17:54      Physical Exam:  His right upper extremity is examined.  There is a line of demarcation indicating the border of cellulitis that has extended up to about the mid upper arm just along the bicep.  While there is some subtle fluid within his olecranon bursa consistent with an olecranon  bursitis it is not acutely swollen and certainly there is no drainage.  His forearm does not show any fluctuance but there is a large area of full-thickness skin loss approximately 8 x 5 cm that had been redressed.  I removed the Xeroform and dry dressing to evaluate which did indeed remove some serosanguineous and exudative material.  He is neurovascular intact distally.   Vitals Temp:  [97.5 F (36.4 C)-101.6 F (38.7 C)] 97.7 F (36.5 C) (05/11 1215) Pulse Rate:  [57-74] 57 (05/11 1215) Resp:  [16-22] 18 (05/11 1215) BP: (110-139)/(49-62) 130/54 (05/11 1215) SpO2:  [92 %-100 %] 99 % (05/11 1215) Weight:  [78 kg-80 kg] 78 kg (05/11 0343) Body mass index is 26.15 kg/m.  Assessment/Plan: Impression: Right upper extremity cellulitis with a large full-thickness area of skin loss on the forearm but no obvious abscess  Treatment:  Agree with planned antibiotic treatment and would recommend ongoing supportive wound care daily.  I would use either Xeroform or Adaptic with a dry dressing and ABD over and change this daily.  I have also ordered warm compresses for his elbow and forearm.  I did review this case with Dr. Orlan Leavens our hand surgeon on-call and if there is any further worsening of his cellulitis or development of abscess I would certainly defer to him for any surgical needs however I do not anticipate this and hopefully with appropriate antibiotic coverage and wound care this should continue to improve.  I discussed our review with Dr. Ophelia Charter and we will continue to be available for any additional needs.  French Ana Breeze Berringer for Dr. Francena Hanly 06/24/2022, 1:51 PM  Contact # [phone]

## 2022-06-24 NOTE — Assessment & Plan Note (Addendum)
-  BCID has returned Strep bacteremia -Blood cultures are pending -Pior h/o MRSA PCR + in 10/23; recheck pending -ID consulted - switched him to cefazolin monotherapy for now with repeat blood cx tomorrow -Will order TTE for now, may need TEE

## 2022-06-24 NOTE — Assessment & Plan Note (Signed)
-  Continue Toviaz, finasteride, tamsulosin

## 2022-06-24 NOTE — Assessment & Plan Note (Signed)
Continue atorvastatin

## 2022-06-24 NOTE — Assessment & Plan Note (Signed)
-   Continue Ativan 

## 2022-06-24 NOTE — Hospital Course (Signed)
89yo with h/o chronic systolic CHF, afib, h/o TAVR, HTN, and stage 3a CKD presenting from ALF with a fall.  R arm skin tear with worsening erythema and fever.  WBC 15.1.  CT R forearm done.  Diagnosed with RUE cellulitis, treated with Rocephin, Vanc.  Wound care consulted.  Noted to have olecranon bursitis of the R elbow; discussed with orthopedics (Dr. Aundria Rud) who recommended antibiotics and elevation.

## 2022-06-24 NOTE — Progress Notes (Signed)
1015- Upon stand up out of bed to go to bathroom, patients right arm wound opened and began bleeding. The wound was dressed according to orders. Patient tolerated well.   Bilateral leg wounds dressed as well according to orders. Patient tolerated well.   Smiley Houseman, RN

## 2022-06-24 NOTE — Progress Notes (Signed)
ANTICOAGULATION CONSULT NOTE  Pharmacy Consult for Warfarin Indication: atrial fibrillation  No Known Allergies  Patient Measurements: Height: 5\' 8"  (172.7 cm) Weight: 78 kg (171 lb 15.3 oz) IBW/kg (Calculated) : 68.4    Vital Signs: Temp: 97.5 F (36.4 C) (05/11 0740) Temp Source: Oral (05/11 0332) BP: 121/52 (05/11 0740) Pulse Rate: 61 (05/11 0740)  Labs: Recent Labs    06/23/22 1734 06/23/22 2359 06/24/22 0412  HGB 11.1*  --  10.9*  HCT 35.4*  --  34.7*  PLT 186  --  167  LABPROT  --  39.9* 37.2*  INR  --  4.1* 3.7*  CREATININE 1.34*  --  1.43*     Estimated Creatinine Clearance: 30.6 mL/min (A) (by C-G formula based on SCr of 1.43 mg/dL (H)).   Medical History: Past Medical History:  Diagnosis Date   AAA (abdominal aortic aneurysm) (HCC)    s/p repair   Acute on chronic systolic (congestive) heart failure (HCC) 12/22/2021   Aortic atherosclerosis (HCC)    Arthritis    knees   CAD (coronary artery disease)    a. s/p POBA 1980s, 1990s (details unclear). b. known stenotic RCA (previous unsuccessful intervention)   Cancer (HCC)    skin cancer removed, right hand, left leg, chest   Carotid artery disease (HCC)    Cholecystitis, acute 12/06/2012   Chronic anticoagulation    Coumadin   Chronic kidney disease, stage 3a (HCC)    Cirrhosis of liver (HCC)    by imaging   Coronary artery disease    GERD (gastroesophageal reflux disease)    Heart failure with mildly reduced ejection fraction (HFmrEF) (HCC)    History of pulmonary embolism    1964 related to dislocated hip on right    History of skin cancer    Hypercholesteremia    Hypertension    Incidental cecal mass noted on CT imaging 05/24/2016   **An incidental finding of potential clinical significance has been found. 4.6 x 5.6 x 3.7 cm masslike lesion in the cecum adjacent to the ileocecal valve may represent a large polyp or colonic neoplasm. 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  On Warfarin PTA for h/o AFib, s/p TAVR, HTN, CKD, HF INR on admission = 4.1 (supratherapeutic)  Today, 5/11 - INR down to 3.7, remains supratherapeutic  Goal of Therapy:  INR 2-3 Monitor platelets by anticoagulation protocol: Yes   Plan:  -Will hold warfarin today and then likely resume 5/12 -INR tomorrow morning  Pricilla Riffle, PharmD, BCPS Clinical Pharmacist 06/24/2022 8:37 AM

## 2022-06-24 NOTE — Progress Notes (Signed)
Progress Note   Patient: Tyler Williams WUJ:[id number] DOB: May 28, 1926 DOA: 06/23/2022     0 DOS: the patient was seen and examined on 06/24/2022   Brief hospital course: 89yo with h/o chronic systolic CHF, afib, h/o TAVR, HTN, and stage 3a CKD presenting from ALF with a fall.  R arm skin tear with worsening erythema and fever.  WBC 15.1.  CT R forearm done.  Diagnosed with RUE cellulitis, treated with Rocephin, Vanc.  Wound care consulted.  Noted to have olecranon bursitis of the R elbow; discussed with orthopedics (Dr. Aundria Rud) who recommended antibiotics and elevation.     Assessment and Plan: * Right arm cellulitis -Patient with fall resulting in skin tear to R forearm now presenting with erythema and edema of the R lower arm and extending above the elbow  -WBC elevated at 20.5 -He was given Vanc and Rocephin in the ER -Dr. Drue Second recommends changing to Ancef for now based on BCID -Admission in this patient is warranted due to bacteremia -Wound care consulted -Needs elevation  Olecranon bursitis of right elbow -EDP discussed case with Dr. Aundria Rud with orthopedics, who recommended antibiotics and elevation. -EDP felt uncomfortable with draining bursa.  -Based on + BCID, will ask ortho for a formal consult today to see if wash out is indicated.   DNR (do not resuscitate)/DNI(Do Not Intubate) -Verified DNR/DNI status with patient/son.  -Pt will need yellow DNR form completed prior to discharge.   Chronic systolic CHF (congestive heart failure) (HCC) -Appears to be compensated at this time -12/2021 echo with EF 40-45% -s/p TAVR -Repeating echo now du to bacteremia, may need TEE -Continue Imdur, Toprol XL, Entresto, spironolactone -Hold Lasix for now  CKD stage 3a, GFR 45-59 ml/min (HCC) -Appears to be stable at this time -Attempt to avoid nephrotoxic medications -Recheck BMP in AM  -If renal function is worsening, would hold Entresto and spironolactone  Permanent  atrial fibrillation (HCC) -Rate controlled with Toprol XL -On Coumadin, dosing per pharmacy  Essential hypertension -Continue Toprol XL, Entresto, spironolactone  Anxiety disorder -Continue Ativan  BPH (benign prostatic hyperplasia) -Continue Toviaz, finasteride, tamsulosin  Pressure injury of skin -R forearm skin tear Pressure Injury 06/24/22 Buttocks Right;Left Stage 1 -  Intact skin with non-blanchable redness of a localized area usually over a bony prominence. redness (Active)  06/24/22 0354  Location: Buttocks  Location Orientation: Right;Left  Staging: Stage 1 -  Intact skin with non-blanchable redness of a localized area usually over a bony prominence.  Wound Description (Comments): redness  Present on Admission: Yes  -Wound care consulted  Streptococcal bacteremia -BCID has returned Strep bacteremia -Blood cultures are pending -Pior h/o MRSA PCR + in 10/23; recheck pending -ID consulted - switched him to cefazolin monotherapy for now with repeat blood cx tomorrow -Will order TTE for now, may need TEE  Constipation -Continue home lactulose, Miralax  Macular degeneration -Significantly visually impaired -Continue preservision  HYPERCHOLESTEROLEMIA  IIA -Continue atorvastatin    Subjective: Patient was sleeping comfortably when I entered the room.  He reports improving discomfort although clearly still has R medial elbow pain with movement of the arm.  No fevers.  He has advanced macular degeneration and is significantly visually impaired.    Physical Exam: Vitals:   06/24/22 0332 06/24/22 0343 06/24/22 0740 06/24/22 1215  BP: (!) 110/51  (!) 121/52 (!) 130/54  Pulse: 60  61 (!) 57  Resp: 18  (!) 22 18  Temp: 98.6 F (37 C)  (!) 97.5 F (36.4  C) 97.7 F (36.5 C)  TempSrc: Oral     SpO2: 97%  100% 99%  Weight:  78 kg    Height:  5\' 8"  (1.727 m)     General:  Appears calm and comfortable and is in NAD Eyes:  normal lids, iris, wearing yellow  glasses ENT:   hard of hearing, grossly normal lips & tongue, mmm Neck:  no LAD, masses or thyromegaly Cardiovascular:  RRR, no m/r/g. B LE edema with stasis, elevated and wrapped by nursing.  Respiratory:   CTA bilaterally with no wheezes/rales/rhonchi.  Normal respiratory effort. Abdomen:  soft, NT, ND Skin:  R forearm with skin tear which continues to weep blood, erythema is mildly improved, edema is mostly confined to peri-elbow region; BLE stasis dermatitis            Musculoskeletal:  no bony abnormality Psychiatric:  grossly normal mood and affect, speech fluent and appropriate, AOx3 Neurologic:  CN 2-12 grossly intact, moves all extremities in coordinated fashion other than from RUE pain   Radiological Exams on Admission: Independently reviewed - see discussion in A/P where applicable  CT FOREARM RIGHT W CONTRAST  Result Date: 06/23/2022 CLINICAL DATA:  Soft tissue mass, deep wrist with redness up to mid humerus. Four day history of right arm pain after fall with laceration to skin. EXAM: CT OF THE UPPER RIGHT EXTREMITY WITH CONTRAST TECHNIQUE: Multidetector CT imaging of the upper right extremity was performed according to the standard protocol following intravenous contrast administration. RADIATION DOSE REDUCTION: This exam was performed according to the departmental dose-optimization program which includes automated exposure control, adjustment of the mA and/or kV according to patient size and/or use of iterative reconstruction technique. CONTRAST:  OMNIPAQUE IOHEXOL 300 MG/ML  SOLN COMPARISON:  None Available. FINDINGS: Bones/Joint/Cartilage Osteopenia is noted. No acute fracture or dislocation is seen. Degenerative changes are present at the elbow and wrist. There is no joint effusion at the elbow Ligaments Suboptimally assessed by CT. Muscles and Tendons Within normal limits. Soft tissues Vascular calcifications are noted in the soft tissues. There is diffuse  subcutaneous edema in the right forearm. There is a circumscribed hypodense collection posterior to the olecranon measuring 4.7 x 1.8 cm, suggesting distended bursa. No rim enhancing fluid collection or mass is seen. No subcutaneous emphysema. IMPRESSION: 1. No acute fracture or dislocation. 2. Extensive subcutaneous edema.  No abscess is seen. 3. Distention of the olecranon bursa, suggesting bursitis. No mass is identified. 4. Degenerative changes at the wrist and elbow. Electronically Signed   By: Thornell Sartorius M.D.   On: 06/23/2022 21:33   DG Forearm Right  Result Date: 06/23/2022 CLINICAL DATA:  Four day history of right arm pain after fall and laceration to the skin EXAM: RIGHT FOREARM - 2 VIEW; RIGHT WRIST - COMPLETE 4 VIEW COMPARISON:  None Available. FINDINGS: There is no evidence of fracture or other focal bone lesions. Soft tissues are unremarkable. Diffuse degenerative changes of the partially imaged hand and elbow. IMPRESSION: No acute displaced fracture or dislocation. No radiopaque foreign bodies. Electronically Signed   By: Agustin Cree M.D.   On: 06/23/2022 17:54   DG Wrist Complete Right  Result Date: 06/23/2022 CLINICAL DATA:  Four day history of right arm pain after fall and laceration to the skin EXAM: RIGHT FOREARM - 2 VIEW; RIGHT WRIST - COMPLETE 4 VIEW COMPARISON:  None Available. FINDINGS: There is no evidence of fracture or other focal bone lesions. Soft tissues are unremarkable. Diffuse degenerative changes  of the partially imaged hand and elbow. IMPRESSION: No acute displaced fracture or dislocation. No radiopaque foreign bodies. Electronically Signed   By: Agustin Cree M.D.   On: 06/23/2022 17:54    EKG: none   Labs on Admission: I have personally reviewed the available labs and imaging studies at the time of the admission.  Pertinent labs:    Glucose 122 BUN 37/Creatinine 1.43/GFR 45 Albumin 2.4 Bili 1.3 - stable Lactate 1.2 WBC 20.5 Hgb 10.9 - stable INR 3.7     Family Communication: Son was present throughout evaluation and then I called back and left a message acknowledging the Strep bacteremia on BCID  Consults:  ID, orthopedics, wound care  Disposition: Status is: Admit - It is my clinical opinion that admission to INPATIENT is reasonable and necessary because of the expectation that this patient will require hospital care that crosses at least 2 midnights to treat this condition based on the medical complexity of the problems presented.  Given the aforementioned information, the predictability of an adverse outcome is felt to be significant.   Planned Discharge Destination:  ALF  Time spent: 50 minutes  Author: Jonah Blue, MD 06/24/2022 12:54 PM  For on call review [email or web address].

## 2022-06-25 ENCOUNTER — Inpatient Hospital Stay (HOSPITAL_COMMUNITY): Payer: Medicare Other

## 2022-06-25 DIAGNOSIS — L03113 Cellulitis of right upper limb: Secondary | ICD-10-CM

## 2022-06-25 DIAGNOSIS — I5022 Chronic systolic (congestive) heart failure: Secondary | ICD-10-CM

## 2022-06-25 DIAGNOSIS — M7021 Olecranon bursitis, right elbow: Secondary | ICD-10-CM

## 2022-06-25 DIAGNOSIS — I4821 Permanent atrial fibrillation: Secondary | ICD-10-CM | POA: Diagnosis not present

## 2022-06-25 DIAGNOSIS — R7881 Bacteremia: Secondary | ICD-10-CM | POA: Diagnosis not present

## 2022-06-25 DIAGNOSIS — B954 Other streptococcus as the cause of diseases classified elsewhere: Secondary | ICD-10-CM | POA: Diagnosis not present

## 2022-06-25 DIAGNOSIS — N1831 Chronic kidney disease, stage 3a: Secondary | ICD-10-CM

## 2022-06-25 LAB — CULTURE, BLOOD (ROUTINE X 2): Culture: NO GROWTH

## 2022-06-25 LAB — BASIC METABOLIC PANEL
Anion gap: 10 (ref 5–15)
BUN: 41 mg/dL — ABNORMAL HIGH (ref 8–23)
CO2: 21 mmol/L — ABNORMAL LOW (ref 22–32)
Calcium: 8.5 mg/dL — ABNORMAL LOW (ref 8.9–10.3)
Chloride: 102 mmol/L (ref 98–111)
Creatinine, Ser: 1.24 mg/dL (ref 0.61–1.24)
GFR, Estimated: 54 mL/min — ABNORMAL LOW (ref >60)
Glucose, Bld: 116 mg/dL — ABNORMAL HIGH (ref 70–99)
Potassium: 4.5 mmol/L (ref 3.5–5.1)
Sodium: 133 mmol/L — ABNORMAL LOW (ref 135–145)

## 2022-06-25 LAB — CBC WITH DIFFERENTIAL/PLATELET
Abs Immature Granulocytes: 0.14 10*3/uL — ABNORMAL HIGH (ref 0.00–0.07)
Basophils Absolute: 0.1 10*3/uL (ref 0.0–0.1)
Basophils Relative: 0 %
Eosinophils Absolute: 0 10*3/uL (ref 0.0–0.5)
Eosinophils Relative: 0 %
HCT: 32.9 % — ABNORMAL LOW (ref 39.0–52.0)
Hemoglobin: 10.4 g/dL — ABNORMAL LOW (ref 13.0–17.0)
Immature Granulocytes: 1 %
Lymphocytes Relative: 3 %
Lymphs Abs: 0.5 10*3/uL — ABNORMAL LOW (ref 0.7–4.0)
MCH: 30.1 pg (ref 26.0–34.0)
MCHC: 31.6 g/dL (ref 30.0–36.0)
MCV: 95.1 fL (ref 80.0–100.0)
Monocytes Absolute: 0.6 10*3/uL (ref 0.1–1.0)
Monocytes Relative: 4 %
Neutro Abs: 14.7 10*3/uL — ABNORMAL HIGH (ref 1.7–7.7)
Neutrophils Relative %: 92 %
Platelets: 159 10*3/uL (ref 150–400)
RBC: 3.46 MIL/uL — ABNORMAL LOW (ref 4.22–5.81)
RDW: 16.2 % — ABNORMAL HIGH (ref 11.5–15.5)
WBC: 16 10*3/uL — ABNORMAL HIGH (ref 4.0–10.5)
nRBC: 0 % (ref 0.0–0.2)

## 2022-06-25 LAB — ECHOCARDIOGRAM COMPLETE
AR max vel: 2.16 cm2
AV Area VTI: 2.04 cm2
AV Area mean vel: 1.86 cm2
AV Mean grad: 5 mmHg
AV Peak grad: 9.9 mmHg
Ao pk vel: 1.57 m/s
Area-P 1/2: 4.57 cm2
Height: 68 in
MV M vel: 5.1 m/s
MV Peak grad: 104 mmHg
Radius: 0.5 cm
S' Lateral: 3.6 cm
Single Plane A4C EF: 51.8 %
Weight: 2751.34 oz

## 2022-06-25 LAB — PROTIME-INR
INR: 3.2 — ABNORMAL HIGH (ref 0.8–1.2)
Prothrombin Time: 33 seconds — ABNORMAL HIGH (ref 11.4–15.2)

## 2022-06-25 LAB — BRAIN NATRIURETIC PEPTIDE: B Natriuretic Peptide: 1136.2 pg/mL — ABNORMAL HIGH (ref 0.0–100.0)

## 2022-06-25 MED ORDER — FUROSEMIDE 10 MG/ML IJ SOLN
20.0000 mg | Freq: Two times a day (BID) | INTRAMUSCULAR | Status: DC
  Administered 2022-06-26 – 2022-06-27 (×3): 20 mg via INTRAVENOUS
  Filled 2022-06-25 (×3): qty 2

## 2022-06-25 NOTE — Progress Notes (Signed)
  Echocardiogram 2D Echocardiogram has been performed.  Brenya Taulbee [employer]er 06/25/2022, 9:55 AM

## 2022-06-25 NOTE — Plan of Care (Signed)
  Problem: Skin Integrity: Goal: Skin integrity will improve Outcome: Progressing   Problem: Health Behavior/Discharge Planning: Goal: Ability to manage health-related needs will improve Outcome: Progressing   Problem: Activity: Goal: Risk for activity intolerance will decrease Outcome: Progressing   Problem: Elimination: Goal: Will not experience complications related to bowel motility Outcome: Progressing   Problem: Pain Managment: Goal: General experience of comfort will improve Outcome: Progressing   Problem: Safety: Goal: Ability to remain free from injury will improve Outcome: Progressing   Problem: Skin Integrity: Goal: Risk for impaired skin integrity will decrease Outcome: Progressing

## 2022-06-25 NOTE — Consult Note (Addendum)
[facility]  Total days of antibiotics 3       Reason for Consult:group a strep bacteremia    Referring Physician: yates  Principal Problem:   Right arm cellulitis Active Problems:   HYPERCHOLESTEROLEMIA  IIA   Essential hypertension   Permanent atrial fibrillation (HCC)   Macular degeneration   Anxiety disorder   Constipation   CKD stage 3a, GFR 45-59 ml/min (HCC)   BPH (benign prostatic hyperplasia)   Chronic systolic CHF (congestive heart failure) (HCC)   Olecranon bursitis of right elbow   DNR (do not resuscitate)/DNI(Do Not Intubate)   Streptococcal bacteremia   Pressure injury of skin    HPI: Tyler Williams is a 89 y.o. male with HTN, pAFIB on warfarin, hx of AS s/p TAVr, CKD3, cirrhosis, and chronic systolic/diastolic CHF sustained fall at nursing home but sustained full thickness skin tear to right forearm, his arm became increasing erythamatous and was brought to ed for evaluation in the setting of also having Fever up to 101F, labs significant for WBC of 20K and left shift. Imaging suggested it tracked to right elbow olecranon bursa. Blood cx grew strep pyogenes. His abtx narrowed to cefazolin. Ortho did not feel he needed any surgery for debridement since no abscess and recommended wound care for full thickness skin tear. He is afebrile, and leukocytosis is improving. Erythema improving about right arm but he does have chronic venous stasis changes to left and right leg.   Past Medical History:  Diagnosis Date   AAA (abdominal aortic aneurysm) (HCC)    s/p repair   Acute on chronic systolic (congestive) heart failure (HCC) 12/22/2021   Aortic atherosclerosis (HCC)    Arthritis    knees   CAD (coronary artery disease)    a. s/p POBA 1980s, 1990s (details unclear). b. known stenotic RCA (previous unsuccessful intervention)   Cancer (HCC)    skin cancer removed, right hand, left leg, chest   Carotid artery disease (HCC)     Cholecystitis, acute 12/06/2012   Chronic anticoagulation    Coumadin   Chronic kidney disease, stage 3a (HCC)    Cirrhosis of liver (HCC)    by imaging   Coronary artery disease    GERD (gastroesophageal reflux disease)    Heart failure with mildly reduced ejection fraction (HFmrEF) (HCC)    History of pulmonary embolism    1964 related to dislocated hip on right    History of skin cancer    Hypercholesteremia    Hypertension    Incidental cecal mass noted on CT imaging 05/24/2016   **An incidental finding of potential clinical significance has been found. 4.6 x 5.6 x 3.7 cm masslike lesion in the cecum adjacent to the ileocecal valve may represent a large polyp or colonic neoplasm. 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   29 mm Edwards Sapien 3 transcatheter heart valve placed via percutaneous right transfemoral approach   Severe aortic stenosis    s/p TAVR   SSS (sick sinus syndrome) (  HCC)     Allergies: No Known Allergies   MEDICATIONS:  atorvastatin  20 mg Oral Daily   fesoterodine  8 mg Oral Daily   finasteride  5 mg Oral Daily   isosorbide mononitrate  30 mg Oral Daily   lactulose  20 g Oral Daily   metoprolol succinate  12.5 mg Oral Daily   multivitamin  1 tablet Oral Daily   polyethylene glycol  17 g Oral QODAY   sacubitril-valsartan  1 tablet Oral BID   spironolactone  25 mg Oral Daily   tamsulosin  0.4 mg Oral Daily   Warfarin - Pharmacist Dosing Inpatient   Does not apply q1600    Social History    Tobacco Use   Smoking status: Never   Smokeless tobacco: Never  Vaping Use   Vaping Use: Never used  Substance Use Topics   Alcohol use: Not Currently    Comment: patient doesn't drink anymore   Drug use: Never    Family History  Problem Relation Age of Onset   Heart attack Mother 11    Review of Systems - pain to right arm from recent injury otherwise 12 point ros is negative  OBJECTIVE: Temp:  [97.7 F (36.5 C)-98.2 F (36.8 C)] 97.7 F (36.5 C) (05/12 1307) Pulse Rate:  [59-73] 59 (05/12 1307) Resp:  [16-18] 18 (05/12 1307) BP: (118-124)/(55-59) 118/59 (05/12 1307) SpO2:  [91 %-96 %] 96 % (05/12 1307) Physical Exam  Constitutional: He is oriented to person, place, and time. He appears frail and stated age No distress.  HENT:  Mouth/Throat: Oropharynx is clear and moist. No oropharyngeal exudate.  Cardiovascular: Normal rate, regular rhythm and normal heart sounds. Exam reveals no gallop and no friction rub.  No murmur heard.  Pulmonary/Chest: Effort normal and breath sounds normal. No respiratory distress. He has no wheezes.  Abdominal: Soft. Bowel sounds are normal. He exhibits no distension. There is no tenderness.  Lymphadenopathy:  He has no cervical adenopathy.  Neurological: He is alert and oriented to person, place, and time.  Skin: Skin is warm and dry. Thin/scarring. Less erythema noted on drawn lines from admit Psychiatric: He has a normal mood and affect. His behavior is normal.   LABS: Results for orders placed or performed during the hospital encounter of 06/23/22 (from the past 48 hour(s))  CBC with Differential/Platelet     Status: Abnormal   Collection Time: 06/23/22  5:34 PM  Result Value Ref Range   WBC 15.1 (H) 4.0 - 10.5 K/uL   RBC 3.73 (L) 4.22 - 5.81 MIL/uL   Hemoglobin 11.1 (L) 13.0 - 17.0 g/dL   HCT 16.1 (L) 09.6 - 04.5 %   MCV 94.9 80.0 - 100.0 fL   MCH 29.8 26.0 - 34.0 pg   MCHC 31.4 30.0 - 36.0 g/dL   RDW 40.9 (H) 81.1 - 91.4 %    Platelets 186 150 - 400 K/uL   nRBC 0.0 0.0 - 0.2 %   Neutrophils Relative % 89 %   Neutro Abs 13.5 (H) 1.7 - 7.7 K/uL   Lymphocytes Relative 3 %   Lymphs Abs 0.4 (L) 0.7 - 4.0 K/uL   Monocytes Relative 7 %   Monocytes Absolute 1.1 (H) 0.1 - 1.0 K/uL   Eosinophils Relative 0 %   Eosinophils Absolute 0.0 0.0 - 0.5 K/uL   Basophils Relative 0 %   Basophils Absolute 0.1 0.0 - 0.1 K/uL   Immature Granulocytes 1 %   Abs Immature Granulocytes 0.07  0.00 - 0.07 K/uL    Comment: Performed at [facility], [address] [place], [place] [address]  Basic metabolic panel     Status: Abnormal   Collection Time: 06/23/22  5:34 PM  Result Value Ref Range   Sodium 136 135 - 145 mmol/L   Potassium 4.5 3.5 - 5.1 mmol/L   Chloride 104 98 - 111 mmol/L   CO2 23 22 - 32 mmol/L   Glucose, Bld 99 70 - 99 mg/dL    Comment: Glucose reference range applies only to samples taken after fasting for at least 8 hours.   BUN 40 (H) 8 - 23 mg/dL   Creatinine, Ser 6.04 (H) 0.61 - 1.24 mg/dL   Calcium 8.7 (L) 8.9 - 10.3 mg/dL   GFR, Estimated 49 (L) >60 mL/min    Comment: (NOTE) Calculated using the CKD-EPI Creatinine Equation (2021)    Anion gap 9 5 - 15    Comment: Performed at [facility], [address], [place], [place] [address]  Lactic acid, plasma     Status: None   Collection Time: 06/23/22  5:34 PM  Result Value Ref Range   Lactic Acid, Venous 1.2 0.5 - 1.9 mmol/L    Comment: Performed at [facility], [address] [place], [place] [address]  Culture, blood (Routine X 2) w Reflex to ID Panel     Status: Abnormal (Preliminary result)   Collection Time: 06/23/22  5:34 PM   Specimen: BLOOD  Result Value Ref Range   Specimen Description      BLOOD RIGHT ANTECUBITAL Performed at [facility], [address], [place], [place] [address]    Special Requests      BOTTLES DRAWN AEROBIC AND ANAEROBIC Blood Culture results may  not be optimal due to an inadequate volume of blood received in culture bottles Performed at [facility], [address], [place], [place] [address]    Culture  Setup Time      GRAM POSITIVE COCCI IN CHAINS IN BOTH AEROBIC AND ANAEROBIC BOTTLES CRITICAL RESULT CALLED TO, READ BACK BY AND VERIFIED WITH: PHARMD CRYSTAL R. [id number] FCP    Culture (A)     GROUP A STREP (S.PYOGENES) ISOLATED SUSCEPTIBILITIES TO FOLLOW Performed at [facility], [address], [place], [place] [address]    Report Status PENDING   Culture, blood (Routine X 2) w Reflex to ID Panel     Status: Abnormal (Preliminary result)   Collection Time: 06/23/22  5:34 PM   Specimen: BLOOD LEFT FOREARM  Result Value Ref Range   Specimen Description      BLOOD LEFT FOREARM Performed at [facility], [address], [place], [place] [address]    Special Requests      BOTTLES DRAWN AEROBIC AND ANAEROBIC Blood Culture adequate volume Performed at [facility], [address] [place], [place] [address]    Culture  Setup Time      GRAM POSITIVE COCCI IN CHAINS IN BOTH AEROBIC AND ANAEROBIC BOTTLES Performed at [facility], [address], [place], [place] [address]    Culture STREPTOCOCCUS PYOGENES (A)    Report Status PENDING   Blood Culture ID Panel (Reflexed)     Status: Abnormal   Collection Time: 06/23/22  5:34 PM  Result Value Ref Range   Enterococcus faecalis NOT DETECTED NOT DETECTED   Enterococcus Faecium NOT DETECTED NOT DETECTED  Listeria monocytogenes NOT DETECTED NOT DETECTED   Staphylococcus species NOT DETECTED NOT DETECTED   Staphylococcus aureus (BCID) NOT DETECTED NOT DETECTED   Staphylococcus epidermidis NOT DETECTED NOT DETECTED   Staphylococcus lugdunensis NOT DETECTED NOT DETECTED   Streptococcus species DETECTED (A) NOT DETECTED    Comment: CRITICAL RESULT CALLED TO, READ BACK BY AND VERIFIED WITH: PHARMD CRYSTAL R. [id number]  FCP    Streptococcus agalactiae NOT DETECTED NOT DETECTED   Streptococcus pneumoniae NOT DETECTED NOT DETECTED   Streptococcus pyogenes DETECTED (A) NOT DETECTED    Comment: CRITICAL RESULT CALLED TO, READ BACK BY AND VERIFIED WITH: PHARMD CRYSTAL R. [id number] FCP    A.calcoaceticus-baumannii NOT DETECTED NOT DETECTED   Bacteroides fragilis NOT DETECTED NOT DETECTED   Enterobacterales NOT DETECTED NOT DETECTED   Enterobacter cloacae complex NOT DETECTED NOT DETECTED   Escherichia coli NOT DETECTED NOT DETECTED   Klebsiella aerogenes NOT DETECTED NOT DETECTED   Klebsiella oxytoca NOT DETECTED NOT DETECTED   Klebsiella pneumoniae NOT DETECTED NOT DETECTED   Proteus species NOT DETECTED NOT DETECTED   Salmonella species NOT DETECTED NOT DETECTED   Serratia marcescens NOT DETECTED NOT DETECTED   Haemophilus influenzae NOT DETECTED NOT DETECTED   Neisseria meningitidis NOT DETECTED NOT DETECTED   Pseudomonas aeruginosa NOT DETECTED NOT DETECTED   Stenotrophomonas maltophilia NOT DETECTED NOT DETECTED   Candida albicans NOT DETECTED NOT DETECTED   Candida auris NOT DETECTED NOT DETECTED   Candida glabrata NOT DETECTED NOT DETECTED   Candida krusei NOT DETECTED NOT DETECTED   Candida parapsilosis NOT DETECTED NOT DETECTED   Candida tropicalis NOT DETECTED NOT DETECTED   Cryptococcus neoformans/gattii NOT DETECTED NOT DETECTED    Comment: Performed at [facility], [address] [place], [place] [address]  Protime-INR     Status: Abnormal   Collection Time: 06/23/22 11:59 PM  Result Value Ref Range   Prothrombin Time 39.9 (H) 11.4 - 15.2 seconds   INR 4.1 (HH) 0.8 - 1.2    Comment: CRITICAL RESULT CALLED TO, READ BACK BY AND VERIFIED WITH: sierra gray, rn 0018 mh (NOTE) INR goal varies based on device and disease states. Performed at [facility], [address], [place], [place] [address]   Protime-INR     Status: Abnormal   Collection Time: 06/24/22   4:12 AM  Result Value Ref Range   Prothrombin Time 37.2 (H) 11.4 - 15.2 seconds   INR 3.7 (H) 0.8 - 1.2    Comment: (NOTE) INR goal varies based on device and disease states. Performed at [facility], [address] [place], [place] [address]   Comprehensive metabolic panel     Status: Abnormal   Collection Time: 06/24/22  4:12 AM  Result Value Ref Range   Sodium 135 135 - 145 mmol/L   Potassium 4.3 3.5 - 5.1 mmol/L   Chloride 101 98 - 111 mmol/L   CO2 25 22 - 32 mmol/L   Glucose, Bld 122 (H) 70 - 99 mg/dL    Comment: Glucose reference range applies only to samples taken after fasting for at least 8 hours.   BUN 37 (H) 8 - 23 mg/dL   Creatinine, Ser 0.86 (H) 0.61 - 1.24 mg/dL   Calcium 8.5 (L) 8.9 - 10.3 mg/dL   Total Protein 6.8 6.5 - 8.1 g/dL   Albumin 2.4 (L) 3.5 - 5.0 g/dL   AST 17 15 - 41 U/L   ALT 15 0 - 44  U/L   Alkaline Phosphatase 105 38 - 126 U/L   Total Bilirubin 1.3 (H) 0.3 - 1.2 mg/dL   GFR, Estimated 45 (L) >60 mL/min    Comment: (NOTE) Calculated using the CKD-EPI Creatinine Equation (2021)    Anion gap 9 5 - 15    Comment: Performed at [facility], [address] [place], [place] [address]  CBC with Differential/Platelet     Status: Abnormal   Collection Time: 06/24/22  4:12 AM  Result Value Ref Range   WBC 20.5 (H) 4.0 - 10.5 K/uL   RBC 3.62 (L) 4.22 - 5.81 MIL/uL   Hemoglobin 10.9 (L) 13.0 - 17.0 g/dL   HCT 60.4 (L) 54.0 - 98.1 %   MCV 95.9 80.0 - 100.0 fL   MCH 30.1 26.0 - 34.0 pg   MCHC 31.4 30.0 - 36.0 g/dL   RDW 19.1 (H) 47.8 - 29.5 %   Platelets 167 150 - 400 K/uL   nRBC 0.0 0.0 - 0.2 %   Neutrophils Relative % 90 %   Neutro Abs 18.4 (H) 1.7 - 7.7 K/uL   Lymphocytes Relative 3 %   Lymphs Abs 0.7 0.7 - 4.0 K/uL   Monocytes Relative 5 %   Monocytes Absolute 1.0 0.1 - 1.0 K/uL   Eosinophils Relative 0 %   Eosinophils Absolute 0.0 0.0 - 0.5 K/uL   Basophils Relative 0 %   Basophils Absolute 0.1 0.0 -  0.1 K/uL   Immature Granulocytes 2 %   Abs Immature Granulocytes 0.39 (H) 0.00 - 0.07 K/uL    Comment: Performed at [facility], [address] [place], [place] [address]  Magnesium     Status: None   Collection Time: 06/24/22  4:12 AM  Result Value Ref Range   Magnesium 2.4 1.7 - 2.4 mg/dL    Comment: Performed at [facility], [address] [place], [place] [address]  MRSA Next Gen by PCR, Nasal     Status: Abnormal   Collection Time: 06/24/22 12:22 PM   Specimen: Nasal Mucosa; Nasal Swab  Result Value Ref Range   MRSA by PCR Next Gen DETECTED (A) NOT DETECTED    Comment: (NOTE) The GeneXpert MRSA Assay (FDA approved for NASAL specimens only), is one component of a comprehensive MRSA colonization surveillance program. It is not intended to diagnose MRSA infection nor to guide or monitor treatment for MRSA infections. Test performance is not FDA approved in patients less than 58 years old. Performed at [facility], [address], [place], [place] [address]   Protime-INR     Status: Abnormal   Collection Time: 06/25/22  3:55 AM  Result Value Ref Range   Prothrombin Time 33.0 (H) 11.4 - 15.2 seconds   INR 3.2 (H) 0.8 - 1.2    Comment: (NOTE) INR goal varies based on device and disease states. Performed at [facility], [address] [place], [place] [address]   CBC with Differential/Platelet     Status: Abnormal   Collection Time: 06/25/22  3:55 AM  Result Value Ref Range   WBC 16.0 (H) 4.0 - 10.5 K/uL   RBC 3.46 (L) 4.22 - 5.81 MIL/uL   Hemoglobin 10.4 (L) 13.0 - 17.0 g/dL   HCT 13.2 (L) 44.0 - 10.2 %   MCV 95.1 80.0 - 100.0 fL   MCH 30.1 26.0 - 34.0 pg   MCHC 31.6 30.0 - 36.0 g/dL   RDW 72.5 (H) 36.6 -  15.5 %   Platelets 159 150 - 400 K/uL   nRBC 0.0 0.0 - 0.2 %   Neutrophils Relative % 92 %   Neutro Abs 14.7 (H) 1.7 - 7.7 K/uL   Lymphocytes Relative 3 %   Lymphs Abs 0.5 (L) 0.7 - 4.0  K/uL   Monocytes Relative 4 %   Monocytes Absolute 0.6 0.1 - 1.0 K/uL   Eosinophils Relative 0 %   Eosinophils Absolute 0.0 0.0 - 0.5 K/uL   Basophils Relative 0 %   Basophils Absolute 0.1 0.0 - 0.1 K/uL   Immature Granulocytes 1 %   Abs Immature Granulocytes 0.14 (H) 0.00 - 0.07 K/uL    Comment: Performed at [facility], [address], [place], [place] [address]  Basic metabolic panel     Status: Abnormal   Collection Time: 06/25/22  3:55 AM  Result Value Ref Range   Sodium 133 (L) 135 - 145 mmol/L   Potassium 4.5 3.5 - 5.1 mmol/L   Chloride 102 98 - 111 mmol/L   CO2 21 (L) 22 - 32 mmol/L   Glucose, Bld 116 (H) 70 - 99 mg/dL    Comment: Glucose reference range applies only to samples taken after fasting for at least 8 hours.   BUN 41 (H) 8 - 23 mg/dL   Creatinine, Ser 6.04 0.61 - 1.24 mg/dL   Calcium 8.5 (L) 8.9 - 10.3 mg/dL   GFR, Estimated 54 (L) >60 mL/min    Comment: (NOTE) Calculated using the CKD-EPI Creatinine Equation (2021)    Anion gap 10 5 - 15    Comment: Performed at [facility], [address] [place], [place] [address]  Brain natriuretic peptide     Status: Abnormal   Collection Time: 06/25/22 12:42 PM  Result Value Ref Range   B Natriuretic Peptide 1,136.2 (H) 0.0 - 100.0 pg/mL    Comment: Performed at [facility], [address], [place], [place] [address]    MICRO: Repeat blood cx pending IMAGING: ECHOCARDIOGRAM COMPLETE  Result Date: 06/25/2022    ECHOCARDIOGRAM REPORT   Patient Name:   HASHIR OSTROWSKY Date of Exam: 06/25/2022 Medical Rec #:  [id number]             Height: Accession #:    [id number]            Weight: Date of Birth:  01-12-1927              BSA: Patient Age:    89 years              BP:           118/59 mmHg Patient Gender: M                     HR:           69 bpm. Exam Location:  Inpatient Procedure: 2D Echo, Cardiac Doppler and Color Doppler Indications:    Bacteremia   History:        Patient has prior history of Echocardiogram examinations, most                 recent 12/23/2021. CHF, Arrythmias:Atrial Fibrillation; Risk                 Factors:Hypertension.                 Aortic Valve: valve is present in the aortic position.  Sonographer:  Lucy Antigua Referring Phys: [id number] JENNIFER YATES IMPRESSIONS  1. Left ventricular ejection fraction, by estimation, is 45 to 50%. The left ventricle has mildly decreased function. The left ventricle demonstrates global hypokinesis. Left ventricular diastolic parameters are indeterminate.  2. Right ventricular systolic function is moderately reduced. The right ventricular size is moderately enlarged. There is mildly elevated pulmonary artery systolic pressure.  3. Left atrial size was severely dilated.  4. Right atrial size was severely dilated.  5. Large pleural effusion.  6. Mild mitral valve regurgitation.  7. S/p 29 mm Sapien prosthetic (TAVR) valve. 06/13/2016. V max 1.5 m/s. mean gradient 5 mmhg. well seated. Normal prothesis. Aortic valve regurgitation is not visualized. There is a valve present in the aortic position.  8. The inferior vena cava is dilated in size with <50% respiratory variability, suggesting right atrial pressure of 15 mmHg. Comparison(s): No significant change from prior study. FINDINGS  Left Ventricle: Left ventricular ejection fraction, by estimation, is 45 to 50%. The left ventricle has mildly decreased function. The left ventricle demonstrates global hypokinesis. The left ventricular internal cavity size was normal in size. There is  no left ventricular hypertrophy. Left ventricular diastolic parameters are indeterminate. Right Ventricle: The right ventricular size is moderately enlarged. Right ventricular systolic function is moderately reduced. There is mildly elevated pulmonary artery systolic pressure. The tricuspid regurgitant velocity is 2.71 m/s, and with an assumed right atrial pressure of 15 mmHg, the  estimated right ventricular systolic pressure is 44.4 mmHg. Left Atrium: Left atrial size was severely dilated. Right Atrium: Right atrial size was severely dilated. Pericardium: Trivial pericardial effusion is present. Mitral Valve: Mild mitral valve regurgitation. Tricuspid Valve: Tricuspid valve regurgitation is mild. Aortic Valve: S/p 29 mm Sapien prosthetic (TAVR) valve. 06/13/2016. V max 1.5 m/s. mean gradient 5 mmhg. well seated. Normal prothesis. Aortic valve regurgitation is not visualized. Aortic valve mean gradient measures 5.0 mmHg. Aortic valve peak gradient measures 9.9 mmHg. Aortic valve area, by VTI measures 2.04 cm. There is a valve present in the aortic position. Pulmonic Valve: Pulmonic valve regurgitation is not visualized. Venous: The inferior vena cava is dilated in size with less than 50% respiratory variability, suggesting right atrial pressure of 15 mmHg. IAS/Shunts: No atrial level shunt detected by color flow Doppler. Additional Comments: There is a large pleural effusion.  LEFT VENTRICLE PLAX 2D LVIDd:         5.10 cm     Diastology LVIDs:         3.60 cm     LV e' medial:    3.81 cm/s LV PW:         1.10 cm     LV E/e' medial:  39.4 LV IVS:        0.90 cm     LV e' lateral:   5.98 cm/s LVOT diam:     2.10 cm     LV E/e' lateral: 25.1 LV SV:         69 LVOT Area:     3.46 cm  LV Volumes (MOD) LV vol d, MOD A4C: 91.2 ml LV vol s, MOD A4C: 44.0 ml LV SV MOD A4C:     91.2 ml RIGHT VENTRICLE            IVC RV S prime:     6.53 cm/s  IVC diam: 3.00 cm TAPSE (M-mode): 1.1 cm LEFT ATRIUM              RIGHT ATRIUM LA Vol (A2C):  169.0 ml RA Area:     25.70 cm LA Vol (A4C):   137.0 ml RA Volume:   78.40 ml LA Biplane Vol: 154.0 ml  AORTIC VALVE AV Area (Vmax):    2.16 cm AV Area (Vmean):   1.86 cm AV Area (VTI):     2.04 cm AV Vmax:           157.00 cm/s AV Vmean:          110.000 cm/s AV VTI:            0.338 m AV Peak Grad:      9.9 mmHg AV Mean Grad:      5.0 mmHg LVOT Vmax:         97.90  cm/s LVOT Vmean:        59.100 cm/s LVOT VTI:          0.199 m LVOT/AV VTI ratio: 0.59  AORTA Ao Root diam: 2.60 cm MITRAL VALVE                  TRICUSPID VALVE MV Area (PHT): 4.57 cm       TR Peak grad:   29.4 mmHg MV Decel Time: 166 msec       TR Vmax:        271.00 cm/s MR Peak grad:    104.0 mmHg MR Mean grad:    71.0 mmHg    SHUNTS MR Vmax:         510.00 cm/s  Systemic VTI:  0.20 m MR Vmean:        399.0 cm/s   Systemic Diam: 2.10 cm MR PISA:         1.57 cm MR PISA Eff ROA: 11 mm MR PISA Radius:  0.50 cm MV E velocity: 150.00 cm/s MV A velocity: 46.70 cm/s MV E/A ratio:  3.21 Photographer signed by Carolan Clines Signature Date/Time: 06/25/2022/1:06:35 PM    Final    CT FOREARM RIGHT W CONTRAST  Result Date: 06/23/2022 CLINICAL DATA:  Soft tissue mass, deep wrist with redness up to mid humerus. Four day history of right arm pain after fall with laceration to skin. EXAM: CT OF THE UPPER RIGHT EXTREMITY WITH CONTRAST TECHNIQUE: Multidetector CT imaging of the upper right extremity was performed according to the standard protocol following intravenous contrast administration. RADIATION DOSE REDUCTION: This exam was performed according to the departmental dose-optimization program which includes automated exposure control, adjustment of the mA and/or kV according to patient size and/or use of iterative reconstruction technique. CONTRAST:  OMNIPAQUE IOHEXOL 300 MG/ML  SOLN COMPARISON:  None Available. FINDINGS: Bones/Joint/Cartilage Osteopenia is noted. No acute fracture or dislocation is seen. Degenerative changes are present at the elbow and wrist. There is no joint effusion at the elbow Ligaments Suboptimally assessed by CT. Muscles and Tendons Within normal limits. Soft tissues Vascular calcifications are noted in the soft tissues. There is diffuse subcutaneous edema in the right forearm. There is a circumscribed hypodense collection posterior to the olecranon measuring 4.7 x 1.8 cm,  suggesting distended bursa. No rim enhancing fluid collection or mass is seen. No subcutaneous emphysema. IMPRESSION: 1. No acute fracture or dislocation. 2. Extensive subcutaneous edema.  No abscess is seen. 3. Distention of the olecranon bursa, suggesting bursitis. No mass is identified. 4. Degenerative changes at the wrist and elbow. Electronically Signed   By: Thornell Sartorius M.D.   On: 06/23/2022 21:33   DG Forearm Right  Result Date: 06/23/2022 CLINICAL  DATA:  Four day history of right arm pain after fall and laceration to the skin EXAM: RIGHT FOREARM - 2 VIEW; RIGHT WRIST - COMPLETE 4 VIEW COMPARISON:  None Available. FINDINGS: There is no evidence of fracture or other focal bone lesions. Soft tissues are unremarkable. Diffuse degenerative changes of the partially imaged hand and elbow. IMPRESSION: No acute displaced fracture or dislocation. No radiopaque foreign bodies. Electronically Signed   By: Agustin Cree M.D.   On: 06/23/2022 17:54   DG Wrist Complete Right  Result Date: 06/23/2022 CLINICAL DATA:  Four day history of right arm pain after fall and laceration to the skin EXAM: RIGHT FOREARM - 2 VIEW; RIGHT WRIST - COMPLETE 4 VIEW COMPARISON:  None Available. FINDINGS: There is no evidence of fracture or other focal bone lesions. Soft tissues are unremarkable. Diffuse degenerative changes of the partially imaged hand and elbow. IMPRESSION: No acute displaced fracture or dislocation. No radiopaque foreign bodies. Electronically Signed   By: Agustin Cree M.D.   On: 06/23/2022 17:54    Assessment/Plan:  89yo M with TAVR and acute onset of group a strep SSTI and bacteremia after recent trauma to right forearm - continue on cefazolin 2gm IV Q 8hr, if no issues with PIV, can do continuous penicillin - follow up on blood cx - does not appear to have any vegetation on TTE. May need to discuss if he needs TEE. Group a strep less likely to cause endocarditis. - continue with wound care to right  forearm  Lindsie Simar B. Drue Second MD MPH [facility] [phone]

## 2022-06-25 NOTE — Progress Notes (Signed)
Progress Note   Patient: Tyler Williams ZOX:[id number] DOB: October 05, 1926 DOA: 06/23/2022     1 DOS: the patient was seen and examined on 06/25/2022   Brief hospital course: 89yo with h/o chronic systolic CHF, afib, h/o TAVR, HTN, and stage 3a CKD presenting from ALF with a fall.  R arm skin tear with worsening erythema and fever.  WBC 15.1.  CT R forearm done.  Diagnosed with RUE cellulitis, treated with Rocephin, Vanc.  Wound care consulted.  Noted to have olecranon bursitis of the R elbow; discussed with orthopedics (Dr. Aundria Rud) who recommended antibiotics and elevation, but no surgical intervention needed at this time.     Assessment and Plan: * Right arm cellulitis -Patient with fall resulting in skin tear to R forearm now presenting with erythema and edema of the R lower arm and extending above the elbow.  White blood cell count improving.  Currently on Ancef.  ID following.  Wound care orders added by orthopedic surgery  Olecranon bursitis of right elbow -EDP discussed case with Dr. Aundria Rud with orthopedics, who recommended antibiotics and elevation.  After positive blood cultures, formal consultation done.  Orthopedic on-call discussed with hand surgery on-call and added wound care orders as well for cellulitis as above   DNR (do not resuscitate)/DNI(Do Not Intubate) -Verified DNR/DNI status with patient/son.  -Pt will need yellow DNR form completed prior to discharge.   Chronic systolic CHF (congestive heart failure) (HCC) -12/2021 echo with EF 40-45% -s/p TAVR -Repeating echo now du to bacteremia, may need TEE -Continue Imdur, Toprol XL, Entresto, spironolactone -Hold Lasix for now, checking BNP  CKD stage 3a, GFR 45-59 ml/min (HCC) -Appears to be stable at this time -Attempt to avoid nephrotoxic medications Renal function improved with creatinine today at 1.24 with GFR 54  Permanent atrial fibrillation (HCC) -Rate controlled with Toprol XL -On Coumadin, dosing per  pharmacy.  INR supra therapeutic on admission, may be secondary to acute CHF causing hepatic congestion causing decreased Coumadin clearance.  INR has been trending down with Coumadin held, and if volume overloaded, diuresis should significantly help this.  Essential hypertension -Continue Toprol XL, Entresto, spironolactone, blood pressure remained stable  Anxiety disorder -Continue Ativan  BPH (benign prostatic hyperplasia) -Continue Toviaz, finasteride, tamsulosin  Pressure injury of skin -R forearm skin tear Pressure Injury 06/24/22 Buttocks Right;Left Stage 1 -  Intact skin with non-blanchable redness of a localized area usually over a bony prominence. redness (Active)  06/24/22 0354  Location: Buttocks  Location Orientation: Right;Left  Staging: Stage 1 -  Intact skin with non-blanchable redness of a localized area usually over a bony prominence.  Wound Description (Comments): redness  Present on Admission: Yes  -Wound care consulted  Streptococcal bacteremia -BCID has returned Strep bacteremia, with repeat blood cultures checked 5/12 -Pior h/o MRSA PCR + in 10/23; now again PCR positive -ID consulted and patient switched to cefazolin monotherapy for now with repeat blood cx tomorrow.  2D echo pending  Constipation -Continue home lactulose, Miralax  Macular degeneration -Significantly visually impaired -Continue preservision  HYPERCHOLESTEROLEMIA  IIA -Continue atorvastatin  Overweight: Meets criteria with BMI greater than 30  Subjective: Patient states that arm is improving, no immediate complaints  Physical Exam: Vitals:   06/24/22 1215 06/24/22 2207 06/25/22 0358 06/25/22 1110  BP: (!) 130/54 (!) 120/55 (!) 118/59 (!) 124/57  Pulse: (!) 57 67 73 73  Resp: 18 18 16    Temp: 97.7 F (36.5 C) 98.2 F (36.8 C) 98.2 F (36.8 C)  TempSrc:  Oral    SpO2: 99% 91% 91%   Weight:      Height:       General: Comfortable, alert and oriented x 2, no acute  distress HEENT: Normocephalic, atraumatic, mucous membranes slightly dry Cardiovascular: Regular rhythm, S1-S2 Respiratory: Clear to auscultation bilaterally Abdomen: Soft, nontender, nondistended, positive bowel sounds Skin: Currently right forearm is wrapped.  Bilateral lower extremity stasis dermatitis.  See pictures below from 5/11.             Musculoskeletal:  no bony abnormality Psychiatric:  grossly normal mood and affect, speech fluent and appropriate, AOx3 Neurologic:  CN 2-12 grossly intact, moves all extremities in coordinated fashion other than from RUE pain   Radiological Exams on Admission: Independently reviewed - see discussion in A/P where applicable  CT FOREARM RIGHT W CONTRAST  Result Date: 06/23/2022 CLINICAL DATA:  Soft tissue mass, deep wrist with redness up to mid humerus. Four day history of right arm pain after fall with laceration to skin. EXAM: CT OF THE UPPER RIGHT EXTREMITY WITH CONTRAST TECHNIQUE: Multidetector CT imaging of the upper right extremity was performed according to the standard protocol following intravenous contrast administration. RADIATION DOSE REDUCTION: This exam was performed according to the departmental dose-optimization program which includes automated exposure control, adjustment of the mA and/or kV according to patient size and/or use of iterative reconstruction technique. CONTRAST:  OMNIPAQUE IOHEXOL 300 MG/ML  SOLN COMPARISON:  None Available. FINDINGS: Bones/Joint/Cartilage Osteopenia is noted. No acute fracture or dislocation is seen. Degenerative changes are present at the elbow and wrist. There is no joint effusion at the elbow Ligaments Suboptimally assessed by CT. Muscles and Tendons Within normal limits. Soft tissues Vascular calcifications are noted in the soft tissues. There is diffuse subcutaneous edema in the right forearm. There is a circumscribed hypodense collection posterior to the olecranon measuring 4.7 x 1.8  cm, suggesting distended bursa. No rim enhancing fluid collection or mass is seen. No subcutaneous emphysema. IMPRESSION: 1. No acute fracture or dislocation. 2. Extensive subcutaneous edema.  No abscess is seen. 3. Distention of the olecranon bursa, suggesting bursitis. No mass is identified. 4. Degenerative changes at the wrist and elbow. Electronically Signed   By: Thornell Sartorius M.D.   On: 06/23/2022 21:33   DG Forearm Right  Result Date: 06/23/2022 CLINICAL DATA:  Four day history of right arm pain after fall and laceration to the skin EXAM: RIGHT FOREARM - 2 VIEW; RIGHT WRIST - COMPLETE 4 VIEW COMPARISON:  None Available. FINDINGS: There is no evidence of fracture or other focal bone lesions. Soft tissues are unremarkable. Diffuse degenerative changes of the partially imaged hand and elbow. IMPRESSION: No acute displaced fracture or dislocation. No radiopaque foreign bodies. Electronically Signed   By: Agustin Cree M.D.   On: 06/23/2022 17:54   DG Wrist Complete Right  Result Date: 06/23/2022 CLINICAL DATA:  Four day history of right arm pain after fall and laceration to the skin EXAM: RIGHT FOREARM - 2 VIEW; RIGHT WRIST - COMPLETE 4 VIEW COMPARISON:  None Available. FINDINGS: There is no evidence of fracture or other focal bone lesions. Soft tissues are unremarkable. Diffuse degenerative changes of the partially imaged hand and elbow. IMPRESSION: No acute displaced fracture or dislocation. No radiopaque foreign bodies. Electronically Signed   By: Agustin Cree M.D.   On: 06/23/2022 17:54    EKG: none   Labs: Creatinine down to 1.24 with GFR 54, BNP pending.  White blood cell count  down to 16, INR down to 3.2.  Family Communication: Will call family  Consults:  ID, orthopedics, wound care  Disposition: Status is: Inpatient Awaiting 2D echo -Continued improvement in cellulitis -Confirming not in acute CHF -Therapeutic INR  Author: Hollice Espy, MD 06/25/2022 12:06 PM  For on call  review [email or web address].

## 2022-06-25 NOTE — Progress Notes (Signed)
Subjective:    Patient is alert and appropriate. He tells me his arm pain has improved since yesterday. No new complaints. He denies chest pain, abdominal pain. He has had some nausea.   Objective: Vital signs in last 24 hours: Temp:  [97.7 F (36.5 C)-98.2 F (36.8 C)] 97.7 F (36.5 C) (05/12 1307) Pulse Rate:  [59-73] 59 (05/12 1307) Resp:  [16-18] 18 (05/12 1307) BP: (118-124)/(55-59) 118/59 (05/12 1307) SpO2:  [91 %-96 %] 96 % (05/12 1307)  Intake/Output from previous day:  Intake/Output Summary (Last 24 hours) at 06/25/2022 1510 Last data filed at 06/25/2022 0650 Gross per 24 hour  Intake 540 ml  Output --  Net 540 ml     Intake/Output this shift: No intake/output data recorded.  Labs: Recent Labs    06/23/22 1734 06/24/22 0412 06/25/22 0355  HGB 11.1* 10.9* 10.4*   Recent Labs    06/24/22 0412 06/25/22 0355  WBC 20.5* 16.0*  RBC 3.62* 3.46*  HCT 34.7* 32.9*  PLT 167 159   Recent Labs    06/24/22 0412 06/25/22 0355  NA 135 133*  K 4.3 4.5  CL 101 102  CO2 25 21*  BUN 37* 41*  CREATININE 1.43* 1.24  GLUCOSE 122* 116*  CALCIUM 8.5* 8.5*   Recent Labs    06/24/22 0412 06/25/22 0355  INR 3.7* 3.2*    Exam: General - Patient is Alert and Oriented RUE:  Forearm is dressed, C/D/I The erythema has not extended past the marked line from yesterday Patient can move his fingers, wrist, and elbow without discomfort   Past Medical History:  Diagnosis Date   AAA (abdominal aortic aneurysm) (HCC)    s/p repair   Acute on chronic systolic (congestive) heart failure (HCC) 12/22/2021   Aortic atherosclerosis (HCC)    Arthritis    knees   CAD (coronary artery disease)    a. s/p POBA 1980s, 1990s (details unclear). b. known stenotic RCA (previous unsuccessful intervention)   Cancer (HCC)    skin cancer removed, right hand, left leg, chest   Carotid artery disease (HCC)    Cholecystitis, acute 12/06/2012   Chronic anticoagulation    Coumadin    Chronic kidney disease, stage 3a (HCC)    Cirrhosis of liver (HCC)    by imaging   Coronary artery disease    GERD (gastroesophageal reflux disease)    Heart failure with mildly reduced ejection fraction (HFmrEF) (HCC)    History of pulmonary embolism    1964 related to dislocated hip on right    History of skin cancer    Hypercholesteremia    Hypertension    Incidental cecal mass noted on CT imaging 05/24/2016   **An incidental finding of potential clinical significance has been found. 4.6 x 5.6 x 3.7 cm masslike lesion in the cecum adjacent to the ileocecal valve may represent a large polyp or colonic neoplasm. 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   29 mm Edwards Sapien 3 transcatheter heart valve placed via percutaneous right transfemoral  approach   Severe aortic stenosis    s/p TAVR   SSS (sick sinus syndrome) (HCC)     Assessment/Plan:    Principal Problem:   Right arm cellulitis Active Problems:   HYPERCHOLESTEROLEMIA  IIA   Essential hypertension   Permanent atrial fibrillation (HCC)   Macular degeneration   Anxiety disorder   Constipation   CKD stage 3a, GFR 45-59 ml/min (HCC)   BPH (benign prostatic hyperplasia)   Chronic systolic CHF (congestive heart failure) (HCC)   Olecranon bursitis of right elbow   DNR (do not resuscitate)/DNI(Do Not Intubate)   Streptococcal bacteremia   Pressure injury of skin  Estimated body mass index is 26.15 kg/m as calculated from the following:   Height as of  this encounter: 5\' 8"  (1.727 m).   Weight as of this encounter: 78 kg. Advance diet    Assessment/Plan: Impression: Right upper extremity cellulitis with a large full-thickness area of skin loss on the forearm but no obvious abscess   Treatment per Consult yesterday: Agree with planned antibiotic treatment and would recommend ongoing supportive wound care daily.  I would use either Xeroform or Adaptic with a dry dressing and ABD over and change this daily.  I have also ordered warm compresses for his elbow and forearm.  I did review this case with Dr. Orlan Leavens our hand surgeon on-call and if there is any further worsening of his cellulitis or development of abscess I would certainly defer to him for any surgical needs however I do not anticipate this and hopefully with appropriate antibiotic coverage and wound care this should continue to improve.  I discussed our review with Dr. Ophelia Charter and we will continue to be available for any additional needs.   Patient reports pain is improving Erythema has not extended up the arm further I briefly discussed with his family in the hall   Dennie Bible, [facility] [phone] 06/25/2022, 3:10 PM

## 2022-06-25 NOTE — Progress Notes (Signed)
ANTICOAGULATION CONSULT NOTE  Pharmacy Consult for Warfarin Indication: atrial fibrillation  No Known Allergies  Patient Measurements: Height: 5\' 8"  (172.7 cm) Weight: 78 kg (171 lb 15.3 oz) IBW/kg (Calculated) : 68.4    Vital Signs: Temp: 98.2 F (36.8 C) (05/12 0358) BP: 124/57 (05/12 1110) Pulse Rate: 73 (05/12 1110)  Labs: Recent Labs    06/23/22 1734 06/23/22 2359 06/24/22 0412 06/25/22 0355  HGB 11.1*  --  10.9* 10.4*  HCT 35.4*  --  34.7* 32.9*  PLT 186  --  167 159  LABPROT  --  39.9* 37.2* 33.0*  INR  --  4.1* 3.7* 3.2*  CREATININE 1.34*  --  1.43* 1.24     Estimated Creatinine Clearance: 35.2 mL/min (by C-G formula based on SCr of 1.24 mg/dL).   Medical History: Past Medical History:  Diagnosis Date   AAA (abdominal aortic aneurysm) (HCC)    s/p repair   Acute on chronic systolic (congestive) heart failure (HCC) 12/22/2021   Aortic atherosclerosis (HCC)    Arthritis    knees   CAD (coronary artery disease)    a. s/p POBA 1980s, 1990s (details unclear). b. known stenotic RCA (previous unsuccessful intervention)   Cancer (HCC)    skin cancer removed, right hand, left leg, chest   Carotid artery disease (HCC)    Cholecystitis, acute 12/06/2012   Chronic anticoagulation    Coumadin   Chronic kidney disease, stage 3a (HCC)    Cirrhosis of liver (HCC)    by imaging   Coronary artery disease    GERD (gastroesophageal reflux disease)    Heart failure with mildly reduced ejection fraction (HFmrEF) (HCC)    History of pulmonary embolism    1964 related to dislocated hip on right    History of skin cancer    Hypercholesteremia    Hypertension    Incidental cecal mass noted on CT imaging 05/24/2016   **An incidental finding of potential clinical significance has been found. 4.6 x 5.6 x 3.7 cm masslike lesion in the cecum adjacent to the ileocecal valve may represent a large polyp or colonic neoplasm. 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  On Warfarin PTA for h/o AFib, s/p TAVR, HTN, CKD, HF INR on admission = 4.1 (supratherapeutic) Per review of notes, some bleeding from right arm wound noted 5/11  Today, 5/11 - INR 3.2, continues to trend down  Goal of Therapy:  INR 2-3 Monitor platelets by anticoagulation protocol: Yes   Plan:  -With INR slightly elevated and with some bleeding previously noted to wound, will hold warfarin again today and likely resume tomorrow -INR tomorrow morning  Pricilla Riffle, PharmD, BCPS Clinical Pharmacist 06/25/2022 12:52 PM

## 2022-06-26 DIAGNOSIS — I5023 Acute on chronic systolic (congestive) heart failure: Secondary | ICD-10-CM | POA: Diagnosis not present

## 2022-06-26 DIAGNOSIS — R7881 Bacteremia: Secondary | ICD-10-CM | POA: Diagnosis not present

## 2022-06-26 DIAGNOSIS — L03113 Cellulitis of right upper limb: Secondary | ICD-10-CM | POA: Diagnosis not present

## 2022-06-26 DIAGNOSIS — N1831 Chronic kidney disease, stage 3a: Secondary | ICD-10-CM | POA: Diagnosis not present

## 2022-06-26 DIAGNOSIS — B955 Unspecified streptococcus as the cause of diseases classified elsewhere: Secondary | ICD-10-CM

## 2022-06-26 LAB — CULTURE, BLOOD (ROUTINE X 2): Special Requests: ADEQUATE

## 2022-06-26 LAB — BASIC METABOLIC PANEL
Anion gap: 10 (ref 5–15)
BUN: 42 mg/dL — ABNORMAL HIGH (ref 8–23)
CO2: 21 mmol/L — ABNORMAL LOW (ref 22–32)
Calcium: 8.1 mg/dL — ABNORMAL LOW (ref 8.9–10.3)
Chloride: 102 mmol/L (ref 98–111)
Creatinine, Ser: 1.14 mg/dL (ref 0.61–1.24)
GFR, Estimated: 60 mL/min — ABNORMAL LOW (ref >60)
Glucose, Bld: 93 mg/dL (ref 70–99)
Potassium: 4.3 mmol/L (ref 3.5–5.1)
Sodium: 133 mmol/L — ABNORMAL LOW (ref 135–145)

## 2022-06-26 LAB — CBC
HCT: 33.3 % — ABNORMAL LOW (ref 39.0–52.0)
Hemoglobin: 10.2 g/dL — ABNORMAL LOW (ref 13.0–17.0)
MCH: 29.8 pg (ref 26.0–34.0)
MCHC: 30.6 g/dL (ref 30.0–36.0)
MCV: 97.4 fL (ref 80.0–100.0)
Platelets: 172 10*3/uL (ref 150–400)
RBC: 3.42 MIL/uL — ABNORMAL LOW (ref 4.22–5.81)
RDW: 16.3 % — ABNORMAL HIGH (ref 11.5–15.5)
WBC: 12 10*3/uL — ABNORMAL HIGH (ref 4.0–10.5)
nRBC: 0 % (ref 0.0–0.2)

## 2022-06-26 LAB — PROTIME-INR
INR: 2.7 — ABNORMAL HIGH (ref 0.8–1.2)
Prothrombin Time: 28.5 seconds — ABNORMAL HIGH (ref 11.4–15.2)

## 2022-06-26 LAB — PROCALCITONIN: Procalcitonin: 0.18 ng/mL

## 2022-06-26 MED ORDER — WARFARIN SODIUM 2 MG PO TABS
2.0000 mg | ORAL_TABLET | Freq: Once | ORAL | Status: AC
  Administered 2022-06-26: 2 mg via ORAL
  Filled 2022-06-26: qty 1

## 2022-06-26 MED ORDER — ALBUMIN HUMAN 25 % IV SOLN
12.5000 g | Freq: Once | INTRAVENOUS | Status: AC
  Administered 2022-06-26: 12.5 g via INTRAVENOUS
  Filled 2022-06-26: qty 50

## 2022-06-26 MED ORDER — LINEZOLID 600 MG PO TABS
600.0000 mg | ORAL_TABLET | Freq: Two times a day (BID) | ORAL | Status: AC
  Administered 2022-06-26 – 2022-06-29 (×6): 600 mg via ORAL
  Filled 2022-06-26 (×7): qty 1

## 2022-06-26 NOTE — Progress Notes (Signed)
Progress Note   Patient: Tyler Williams ZOX:[id number] DOB: 12/02/1926 DOA: 06/23/2022     2 DOS: the patient was seen and examined on 06/26/2022   Brief hospital course: 89yo with h/o chronic systolic CHF, afib, h/o TAVR, HTN, and stage 3a CKD presenting from ALF with a fall.  R arm skin tear with worsening erythema and fever.  WBC 15.1.  CT R forearm done.  Diagnosed with RUE cellulitis, treated with Rocephin, Vanc.  Wound care consulted.  Noted to have olecranon bursitis of the R elbow; discussed with orthopedics (Dr. Aundria Rud) who recommended antibiotics and elevation, but no surgical intervention needed at this time.   Assessment and Plan: * Right arm cellulitis -Patient with fall resulting in skin tear to R forearm now presenting with erythema and edema of the R lower arm and extending above the elbow.  White blood cell count improving.  Currently on Ancef.  ID following.  Wound care orders added by orthopedic surgery.  2D echo noting no evidence of valvular vegetation.  To decide on TEE.    Olecranon bursitis of right elbow -EDP discussed case with Dr. Aundria Rud with orthopedics, who recommended antibiotics and elevation.  After positive blood cultures, formal consultation done.  Orthopedic on-call discussed with hand surgery on-call and added wound care orders as well for cellulitis as above  DNR (do not resuscitate)/DNI(Do Not Intubate) -Verified DNR/DNI status with patient/son.  -Pt will need yellow DNR form completed prior to discharge.   Acute on chronic systolic CHF (congestive heart failure) (HCC) Repeat echo during this hospitalization she notes improvement from previous echo 6 months ago.  Now with ejection fraction of 45 to 50% with global hypokinesis and indeterminate diastolic function. -Continue Imdur, Toprol XL, Entresto, spironolactone BNP elevated on 5/12 at 1136.  Patient started on IV Lasix.  He is normally on 40 mg p.o. daily as needed.  Will add dose of IV  albumin.  CKD stage 3a, GFR 45-59 ml/min (HCC) -Appears to be stable at this time -Attempt to avoid nephrotoxic medications Renal function improved with creatinine despite Lasix started 5/12.  Creatinine currently at 1.14.  Permanent atrial fibrillation (HCC) with supratherapeutic INR -Rate controlled with Toprol XL -On Coumadin, dosing per pharmacy.  INR supra therapeutic on admission at 4.1, likely secondary to acute CHF causing hepatic congestion causing decreased Coumadin clearance.  INR has been trending down with Coumadin held, and expected number should drop with further diuresis.  Essential hypertension -Continue Toprol XL, Entresto, spironolactone, blood pressure remained stable  Anxiety disorder -Continue Ativan  BPH (benign prostatic hyperplasia) -Continue Toviaz, finasteride, tamsulosin  Pressure injury of skin -R forearm skin tear Pressure Injury 06/24/22 Buttocks Right;Left Stage 1 -  Intact skin with non-blanchable redness of a localized area usually over a bony prominence. redness (Active)  06/24/22 0354  Location: Buttocks  Location Orientation: Right;Left  Staging: Stage 1 -  Intact skin with non-blanchable redness of a localized area usually over a bony prominence.  Wound Description (Comments): redness  Present on Admission: Yes  -Wound care consulted  Streptococcal bacteremia -BCID has returned Strep bacteremia, with repeat blood cultures checked 5/12 -Pior h/o MRSA PCR + in 10/23; now again PCR positive -ID consulted and patient switched to cefazolin monotherapy.  Echocardiogram unremarkable for valvular vegetation.  TEE decision to be made.  Constipation -Continue home lactulose, Miralax  Macular degeneration -Significantly visually impaired -Continue preservision  HYPERCHOLESTEROLEMIA  IIA -Continue atorvastatin  Overweight: Meets criteria with BMI greater than 30  Subjective: No  complaints  Physical Exam: Vitals:   06/25/22 1307 06/25/22  2128 06/26/22 0600 06/26/22 0638  BP: (!) 118/59 (!) 118/54  (!) 108/54  Pulse: (!) 59 61  67  Resp: 18 15  17   Temp: 97.7 F (36.5 C) 98.2 F (36.8 C)  98 F (36.7 C)  TempSrc: Oral     SpO2: 96% 97%  95%  Weight:   57.8 kg   Height:       General: Comfortable, alert and oriented x 2, no acute distress HEENT: Normocephalic, atraumatic, mucous membranes slightly dry Cardiovascular: Regular rhythm, S1-S2, 2 out of 6 systolic ejection murmur Respiratory: Clear to auscultation bilaterally Abdomen: Soft, nontender, nondistended, positive bowel sounds Skin: Currently right forearm is wrapped.  Bilateral lower extremity stasis dermatitis.  See pictures below from 5/11.             Musculoskeletal:  no bony abnormality Psychiatric:  grossly normal mood and affect, speech fluent and appropriate, AOx3 Neurologic:  CN 2-12 grossly intact, moves all extremities in coordinated fashion other than from RUE pain   Radiological Exams on Admission: Independently reviewed - see discussion in A/P where applicable  ECHOCARDIOGRAM COMPLETE  Result Date: 06/25/2022    ECHOCARDIOGRAM REPORT   Patient Name:   ANEIL ANGELINI Date of Exam: 06/25/2022 Medical Rec #:  [id number]             Height: Accession #:    [id number]            Weight: Date of Birth:  Mar 10, 1926              BSA: Patient Age:    89 years              BP:           118/59 mmHg Patient Gender: M                     HR:           69 bpm. Exam Location:  Inpatient Procedure: 2D Echo, Cardiac Doppler and Color Doppler Indications:    Bacteremia  History:        Patient has prior history of Echocardiogram examinations, most                 recent 12/23/2021. CHF, Arrythmias:Atrial Fibrillation; Risk                 Factors:Hypertension.                 Aortic Valve: valve is present in the aortic position.  Sonographer:    Lucy Antigua Referring Phys: [id number] JENNIFER YATES IMPRESSIONS  1. Left ventricular ejection fraction, by  estimation, is 45 to 50%. The left ventricle has mildly decreased function. The left ventricle demonstrates global hypokinesis. Left ventricular diastolic parameters are indeterminate.  2. Right ventricular systolic function is moderately reduced. The right ventricular size is moderately enlarged. There is mildly elevated pulmonary artery systolic pressure.  3. Left atrial size was severely dilated.  4. Right atrial size was severely dilated.  5. Large pleural effusion.  6. Mild mitral valve regurgitation.  7. S/p 29 mm Sapien prosthetic (TAVR) valve. 06/13/2016. V max 1.5 m/s. mean gradient 5 mmhg. well seated. Normal prothesis. Aortic valve regurgitation is not visualized. There is a valve present in the aortic position.  8. The inferior vena cava is dilated in size with <50% respiratory variability, suggesting right atrial pressure of 15 mmHg.  Comparison(s): No significant change from prior study. FINDINGS  Left Ventricle: Left ventricular ejection fraction, by estimation, is 45 to 50%. The left ventricle has mildly decreased function. The left ventricle demonstrates global hypokinesis. The left ventricular internal cavity size was normal in size. There is  no left ventricular hypertrophy. Left ventricular diastolic parameters are indeterminate. Right Ventricle: The right ventricular size is moderately enlarged. Right ventricular systolic function is moderately reduced. There is mildly elevated pulmonary artery systolic pressure. The tricuspid regurgitant velocity is 2.71 m/s, and with an assumed right atrial pressure of 15 mmHg, the estimated right ventricular systolic pressure is 44.4 mmHg. Left Atrium: Left atrial size was severely dilated. Right Atrium: Right atrial size was severely dilated. Pericardium: Trivial pericardial effusion is present. Mitral Valve: Mild mitral valve regurgitation. Tricuspid Valve: Tricuspid valve regurgitation is mild. Aortic Valve: S/p 29 mm Sapien prosthetic (TAVR) valve. 06/13/2016.  V max 1.5 m/s. mean gradient 5 mmhg. well seated. Normal prothesis. Aortic valve regurgitation is not visualized. Aortic valve mean gradient measures 5.0 mmHg. Aortic valve peak gradient measures 9.9 mmHg. Aortic valve area, by VTI measures 2.04 cm. There is a valve present in the aortic position. Pulmonic Valve: Pulmonic valve regurgitation is not visualized. Venous: The inferior vena cava is dilated in size with less than 50% respiratory variability, suggesting right atrial pressure of 15 mmHg. IAS/Shunts: No atrial level shunt detected by color flow Doppler. Additional Comments: There is a large pleural effusion.  LEFT VENTRICLE PLAX 2D LVIDd:         5.10 cm     Diastology LVIDs:         3.60 cm     LV e' medial:    3.81 cm/s LV PW:         1.10 cm     LV E/e' medial:  39.4 LV IVS:        0.90 cm     LV e' lateral:   5.98 cm/s LVOT diam:     2.10 cm     LV E/e' lateral: 25.1 LV SV:         69 LVOT Area:     3.46 cm  LV Volumes (MOD) LV vol d, MOD A4C: 91.2 ml LV vol s, MOD A4C: 44.0 ml LV SV MOD A4C:     91.2 ml RIGHT VENTRICLE            IVC RV S prime:     6.53 cm/s  IVC diam: 3.00 cm TAPSE (M-mode): 1.1 cm LEFT ATRIUM              RIGHT ATRIUM LA Vol (A2C):   169.0 ml RA Area:     25.70 cm LA Vol (A4C):   137.0 ml RA Volume:   78.40 ml LA Biplane Vol: 154.0 ml  AORTIC VALVE AV Area (Vmax):    2.16 cm AV Area (Vmean):   1.86 cm AV Area (VTI):     2.04 cm AV Vmax:           157.00 cm/s AV Vmean:          110.000 cm/s AV VTI:            0.338 m AV Peak Grad:      9.9 mmHg AV Mean Grad:      5.0 mmHg LVOT Vmax:         97.90 cm/s LVOT Vmean:        59.100 cm/s LVOT VTI:  0.199 m LVOT/AV VTI ratio: 0.59  AORTA Ao Root diam: 2.60 cm MITRAL VALVE                  TRICUSPID VALVE MV Area (PHT): 4.57 cm       TR Peak grad:   29.4 mmHg MV Decel Time: 166 msec       TR Vmax:        271.00 cm/s MR Peak grad:    104.0 mmHg MR Mean grad:    71.0 mmHg    SHUNTS MR Vmax:         510.00 cm/s  Systemic VTI:   0.20 m MR Vmean:        399.0 cm/s   Systemic Diam: 2.10 cm MR PISA:         1.57 cm MR PISA Eff ROA: 11 mm MR PISA Radius:  0.50 cm MV E velocity: 150.00 cm/s MV A velocity: 46.70 cm/s MV E/A ratio:  3.21 Photographer signed by Carolan Clines Signature Date/Time: 06/25/2022/1:06:35 PM    Final     EKG: none   Labs: Creatinine down to 1.14.  BNP of 1136.  White blood cell count of 12 and INR of 2.7.  Family Communication: Will call family  Consults:  ID, orthopedics, wound care  Disposition: Status is: Inpatient -Diuresing -Continued improvement in cellulitis  Author: Hollice Espy, MD 06/26/2022 12:54 PM  For on call review [email or web address].

## 2022-06-26 NOTE — TOC Initial Note (Signed)
Transition of Care [facility]) - Initial/Assessment Note    Patient Details  Name: Tyler Williams MRN: [id number] Date of Birth: 11-15-1926  Transition [facility]) CM/SW Contact:    Otelia Santee, LCSW Phone Number: 06/26/2022, 3:42 PM  Clinical Narrative:                 Pt from [facility]. Pt stays at [facility] ALF portion of facility. Spoke with pt's daughter who confirms this. Current plan for pt to return to [facility] at discharge.  TOC will continue to follow for discharge plans/needs.   Expected Discharge Plan: Assisted Living Barriers to Discharge: Continued Medical Work up   Patient Goals and CMS Choice Patient states their goals for this hospitalization and ongoing recovery are:: For pt to return to [employer].gov Compare Post Acute Care list provided to:: Patient Represenative (must comment) Choice offered to / list presented to : Adult Children [place] ownership interest in [facility].provided to:: Adult Children    Expected Discharge Plan and Services In-house Referral: Clinical Social Work Discharge Planning Services: NA Post Acute Care Choice: Resumption of Svcs/PTA Provider Living arrangements for the past 2 months: Assisted Living Facility                 DME Arranged: N/A DME Agency: NA                  Prior Living Arrangements/Services Living arrangements for the past 2 months: Assisted Living Facility Lives with:: Facility Resident Patient language and need for interpreter reviewed:: Yes Do you feel safe going back to the place where you live?: Yes      Need for Family Participation in Patient Care: Yes (Comment) Care giver support system in place?: Yes (comment) Current home services: DME Criminal Activity/Legal Involvement Pertinent to Current Situation/Hospitalization: No - Comment as needed  Activities of Daily Living Home Assistive Devices/Equipment: Dan Humphreys (specify type) ADL Screening  (condition at time of admission) Patient's cognitive ability adequate to safely complete daily activities?: Yes Is the patient deaf or have difficulty hearing?: No Does the patient have difficulty seeing, even when wearing glasses/contacts?: Yes Does the patient have difficulty concentrating, remembering, or making decisions?: No Patient able to express need for assistance with ADLs?: Yes Does the patient have difficulty dressing or bathing?: Yes Independently performs ADLs?: No Communication: Independent Dressing (OT): Needs assistance Is this a change from baseline?: Pre-admission baseline Grooming: Needs assistance Is this a change from baseline?: Pre-admission baseline Feeding: Needs assistance Is this a change from baseline?: Pre-admission baseline Bathing: Needs assistance Is this a change from baseline?: Pre-admission baseline Toileting: Needs assistance Is this a change from baseline?: Pre-admission baseline In/Out Bed: Needs assistance Is this a change from baseline?: Pre-admission baseline Does the patient have difficulty walking or climbing stairs?: Yes Weakness of Legs: Both Weakness of Arms/Hands: Right (cellulitis)  Permission Sought/Granted Permission sought to share information with : Facility Medical sales representative, Family Supports Permission granted to share information with : Yes, Verbal Permission Granted  Share Information with NAME: Olevia Perches  Permission granted to share info w AGENCY: [facility]  Permission granted to share info w Relationship: Daughter  Permission granted to share info w Contact Information: [phone]  Emotional Assessment Appearance:: Appears stated age Attitude/Demeanor/Rapport: Unable to Assess Affect (typically observed): Unable to Assess Orientation: : Oriented to Self Alcohol / Substance Use: Not Applicable Psych Involvement: No (comment)  Admission diagnosis:  Right arm cellulitis [L03.113] Cellulitis, unspecified  cellulitis site [  L03.90] Streptococcal bacteremia [R78.81, B95.5] Patient Active Problem List   Diagnosis Date Noted   Streptococcal bacteremia 06/24/2022   Pressure injury of skin 06/24/2022   Right arm cellulitis 06/23/2022   Olecranon bursitis of right elbow 06/23/2022   DNR (do not resuscitate)/DNI(Do Not Intubate) 06/23/2022   Chronic systolic CHF (congestive heart failure) (HCC) 04/10/2022   Normocytic anemia 11/21/2021   CKD stage 3a, GFR 45-59 ml/min (HCC) 11/21/2021   Thrombocytopenia (HCC) 11/21/2021   BPH (benign prostatic hyperplasia) 11/21/2021   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) 10/05/2021   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   Pain of left hand 08/12/2020   Abrasion of unspecified back wall of thorax, initial encounter 07/23/2020   Fall 07/23/2020   Hearing loss 07/23/2020   Laceration without foreign body of right elbow, initial encounter 07/23/2020   Gross hematuria    DCM (dilated cardiomyopathy) (HCC)    Chronic atrial fibrillation (HCC)    Congestive heart failure (HCC) 06/12/2020   Dyspnea 04/24/2019   Chronic obstructive pulmonary disease (HCC) 06/10/2018   Hardening of the aorta (main artery of the heart) (HCC) 06/10/2018   Hypertensive heart and chronic kidney disease with heart failure and stage 1 through stage 4 chronic kidney disease, or unspecified chronic kidney disease (HCC) 04/24/2018   Aorta aneurysm (HCC) 02/14/2018   Cirrhosis of liver (HCC) 02/08/2018   Constipation 01/15/2018   Acute on chronic combined systolic and diastolic CHF (congestive heart failure) (HCC) 09/25/2017   Anticoagulated on Coumadin 08/28/2017   S/P TAVR (transcatheter aortic valve replacement)    Presence of prosthetic heart valve 09/06/2016   S/P TAVR (transcatheter aortic valve replacement) 06/13/2016   Incidental cecal mass noted on CT imaging 05/24/2016   Inguinal hernia 02/24/2016   Exudative age-related macular degeneration of both eyes with active  choroidal neovascularization (HCC) 12/22/2014   Non-thrombocytopenic purpura (HCC) 08/19/2014   Encounter for therapeutic drug monitoring 03/20/2013   Pseudophakia of both eyes 03/19/2013   Macular degeneration 03/19/2013   Hematuria, microscopic 12/03/2012   Anxiety disorder 06/20/2012   Long term (current) use of anticoagulants 06/20/2012   Cystoid macular edema 05/14/2012   Overweight (BMI 25.0-29.9) 01/23/2012   Hyponatremia 01/23/2012   S/P right UKR 01/22/2012   Carotid artery bruit 11/21/2011   Carotid bruit 11/21/2011   Presence of intraocular lens 02/16/2011   Osteoarthritis 07/05/2010   EDEMA 07/14/2009   Essential hypertension 01/27/2009   HYPERCHOLESTEROLEMIA  IIA 10/21/2008   CAD (coronary artery disease) 10/21/2008   Permanent atrial fibrillation (HCC) 10/21/2008   PCP:  Geoffry Paradise, MD Pharmacy:   [facility] - [place], [place] - [address] [place] [address] Phone: [phone] Fax: [phone]     Social Determinants of Health (SDOH) Social History: SDOH Screenings   Food Insecurity: No Food Insecurity (06/24/2022)  Housing: Low Risk  (06/24/2022)  Transportation Needs: No Transportation Needs (06/24/2022)  Utilities: Not At Risk (06/24/2022)  Depression (PHQ2-9): Low Risk  (12/06/2018)  Tobacco Use: Low Risk  (06/24/2022)   SDOH Interventions:     Readmission Risk Interventions    06/26/2022    3:39 PM 12/24/2021    3:52 PM  Readmission Risk Prevention Plan  Transportation Screening Complete Complete  PCP or Specialist Appt within 3-5 Days  Complete  HRI or Home Care Consult  Complete  Social Work Consult for Recovery Care Planning/Counseling  Complete  Palliative Care Screening  Not Applicable  Medication Review Oceanographer)  Complete  PCP or Specialist appointment within 3-5 days of discharge Complete   HRI or Home Care Consult Complete   SW Recovery  Care/Counseling Consult Complete   Palliative Care Screening Not Applicable   Skilled Nursing Facility Not Applicable

## 2022-06-26 NOTE — Consult Note (Signed)
WOC consult requested for arm and bilat legs.  This was already performed on 5/11; please refer to progress notes by Ladona Mow for wound assessments, and topical treatment orders have been provided for bedside nurses to perform.  Please re-consult if further assistance is needed.  Thank-you,  Cammie Mcgee MSN, RN, CWOCN, [place], CNS [phone]

## 2022-06-26 NOTE — Progress Notes (Addendum)
ANTICOAGULATION CONSULT NOTE  Pharmacy Consult for Warfarin Indication: atrial fibrillation  No Known Allergies  Patient Measurements: Height: 5\' 8"  (172.7 cm) Weight: 57.8 kg (127 lb 6.8 oz) IBW/kg (Calculated) : 68.4    Vital Signs: Temp: 98 F (36.7 C) (05/13 0638) BP: 108/54 (05/13 1610) Pulse Rate: 67 (05/13 0638)  Labs: Recent Labs    06/24/22 0412 06/25/22 0355 06/26/22 0357  HGB 10.9* 10.4* 10.2*  HCT 34.7* 32.9* 33.3*  PLT 167 159 172  LABPROT 37.2* 33.0* 28.5*  INR 3.7* 3.2* 2.7*  CREATININE 1.43* 1.24 1.14     Estimated Creatinine Clearance: 32.4 mL/min (by C-G formula based on SCr of 1.14 mg/dL).   Medical History: Past Medical History:  Diagnosis Date   AAA (abdominal aortic aneurysm) (HCC)    s/p repair   Acute on chronic systolic (congestive) heart failure (HCC) 12/22/2021   Aortic atherosclerosis (HCC)    Arthritis    knees   CAD (coronary artery disease)    a. s/p POBA 1980s, 1990s (details unclear). b. known stenotic RCA (previous unsuccessful intervention)   Cancer (HCC)    skin cancer removed, right hand, left leg, chest   Carotid artery disease (HCC)    Cholecystitis, acute 12/06/2012   Chronic anticoagulation    Coumadin   Chronic kidney disease, stage 3a (HCC)    Cirrhosis of liver (HCC)    by imaging   Coronary artery disease    GERD (gastroesophageal reflux disease)    Heart failure with mildly reduced ejection fraction (HFmrEF) (HCC)    History of pulmonary embolism    1964 related to dislocated hip on right    History of skin cancer    Hypercholesteremia    Hypertension    Incidental cecal mass noted on CT imaging 05/24/2016   **An incidental finding of potential clinical significance has been found. 4.6 x 5.6 x 3.7 cm masslike lesion in the cecum adjacent to the ileocecal valve may represent a large polyp or colonic neoplasm. Correlation with nonemergent colonoscopy is strongly recommended in the near future to better  evaluate this finding.**   Macular degeneration    eye injections   Microscopic hematuria    Followed by urology   Permanent atrial fibrillation [facility])    Pulmonary hypertension (HCC)    PVD (peripheral vascular disease) (HCC)    Bilateral renal artery atherosclerosis, SMA stenosis with collateralization   RBBB    Chronic   Rib fracture 04/11/2022   S/P TAVR (transcatheter aortic valve replacement) 06/13/2016   29 mm Edwards Sapien 3 transcatheter heart valve placed via percutaneous right transfemoral approach   Severe aortic stenosis    s/p TAVR   SSS (sick sinus syndrome) (HCC)     Medications:  PTA Warfarin 2.5 mg daily - LD 06/22/22 @ 1800  Assessment: 89 yr male admitted with cellulitis.  On Warfarin PTA for h/o AFib, s/p TAVR, HTN, CKD, HF INR on admission = 4.1 (supratherapeutic) Per review of notes, some bleeding from right arm wound noted 5/11  Today, 06/26/22  - INR = 2.7, at goal (trended down after holding for 3 days) - Hgb=10.2, low but stable - Plt = 175 WNL  Goal of Therapy:  INR 2-3 Monitor platelets by anticoagulation protocol: Yes   Plan:  - Give 2mg  x 1 dose today. - Daily PT/INR and CBC - Continue to monitor H&H and platelets - Monitor signs/symptoms of bleeding  Myha Arizpe Tylene Fantasia, PharmD, BCPS Clinical Pharmacist 06/26/2022 9:46 AM

## 2022-06-26 NOTE — Progress Notes (Signed)
[facility] Infectious Diseases Follow Up Note  Patient Identification: Patient Name: Tyler Williams MRN: [id number] Admit Date: 06/23/2022  4:33 PM Age: 89 y.o.Today's Date: 06/26/2022  Reason for Visit: Bacteremia   Principal Problem:   Right arm cellulitis Active Problems:   HYPERCHOLESTEROLEMIA  IIA   Essential hypertension   Permanent atrial fibrillation (HCC)   Macular degeneration   Anxiety disorder   Constipation   CKD stage 3a, GFR 45-59 ml/min (HCC)   BPH (benign prostatic hyperplasia)   Chronic systolic CHF (congestive heart failure) (HCC)   Olecranon bursitis of right elbow   DNR (do not resuscitate)/DNI(Do Not Intubate)   Streptococcal bacteremia   Pressure injury of skin   Antibiotics: Vancomycin 5/10 Ceftriaxone 5/10 Cefazolin 5/11-c  Lines/Hardwares:  TAVR, left knee replacement, partial rt knee replacement  Interval Events:   Assessment Group A strep bacteremia 2/2 Fall leading to rt forearm cellulitis and olecranon bursitis  Liver cirrhosis   Recommendations    Rest of the management as per the primary team. Thank you for the consult. Please page with pertinent questions or concerns.  ______________________________________________________________________ Subjective patient seen and examined at the bedside.  Past Medical History:  Diagnosis Date   AAA (abdominal aortic aneurysm) (HCC)    s/p repair   Acute on chronic systolic (congestive) heart failure (HCC) 12/22/2021   Aortic atherosclerosis (HCC)    Arthritis    knees   CAD (coronary artery disease)    a. s/p POBA 1980s, 1990s (details unclear). b. known stenotic RCA (previous unsuccessful intervention)   Cancer (HCC)    skin cancer removed, right hand, left leg, chest   Carotid artery disease (HCC)    Cholecystitis, acute 12/06/2012   Chronic anticoagulation    Coumadin   Chronic kidney disease, stage 3a (HCC)     Cirrhosis of liver (HCC)    by imaging   Coronary artery disease    GERD (gastroesophageal reflux disease)    Heart failure with mildly reduced ejection fraction (HFmrEF) (HCC)    History of pulmonary embolism    1964 related to dislocated hip on right    History of skin cancer    Hypercholesteremia    Hypertension    Incidental cecal mass noted on CT imaging 05/24/2016   **An incidental finding of potential clinical significance has been found. 4.6 x 5.6 x 3.7 cm masslike lesion in the cecum adjacent to the ileocecal valve may represent a large polyp or colonic neoplasm. 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 Unable to pass wire past RCA lesion->medically managed   CHOLECYSTECTOMY N/A 12/05/2012   Procedure: LAPAROSCOPIC CHOLECYSTECTOMY ;  Surgeon: Mariella Saa, MD;  Location: [facility] OR;  Service: General;  Laterality: N/A;   CHOLECYSTECTOMY     CORONARY ANGIOPLASTY  1980, 1990   CORONARY BALLOON ANGIOPLASTY N/A 08/31/2017   Procedure: CORONARY BALLOON ANGIOPLASTY;   Surgeon: Swaziland, Peter M, MD;  Location: [facility] INVASIVE CV LAB;  Service: Cardiovascular;  Laterality: N/A;   EYE SURGERY     hx of cataract surgery    HEMORRHOID SURGERY     1973   HERNIA REPAIR     right inguinal hernia repair    JOINT REPLACEMENT     left knee replacement, partial right   LEFT HEART CATH AND CORONARY ANGIOGRAPHY N/A 08/31/2017   Procedure: LEFT HEART CATH AND CORONARY ANGIOGRAPHY;  Surgeon: Swaziland, Peter M, MD;  Location: [facility] INVASIVE CV LAB;  Service: Cardiovascular;  Laterality: N/A;   LEFT HEART CATHETERIZATION WITH CORONARY ANGIOGRAM N/A 05/24/2012   Procedure: LEFT HEART CATHETERIZATION WITH CORONARY ANGIOGRAM;  Surgeon: Kathleene Hazel, MD;  Location: [facility] CATH LAB;  Service: Cardiovascular;  Laterality: N/A;   ORTHOPEDIC SURGERY     OTHER SURGICAL HISTORY     right knee popliteal aneurysm surgery stent placed    OTHER SURGICAL HISTORY     PARTIAL KNEE ARTHROPLASTY  01/22/2012   Procedure: UNICOMPARTMENTAL KNEE;  Surgeon: [name], MD;  Location: [facility] ORS;  Service: Orthopedics;  Laterality: Right;   PERCUTANEOUS CORONARY INTERVENTION-BALLOON ONLY  05/24/2012   Procedure: PERCUTANEOUS CORONARY INTERVENTION-BALLOON ONLY;  Surgeon: Kathleene Hazel, MD;  Location: [facility] CATH LAB;  Service: Cardiovascular;;   RIGHT/LEFT HEART CATH AND CORONARY ANGIOGRAPHY N/A 05/12/2016   Procedure: Right/Left Heart Cath and Coronary Angiography;  Surgeon: Tonny Bollman, MD;  Location: [facility] INVASIVE CV LAB;  Service: Cardiovascular;  Laterality: N/A;   TEE WITHOUT CARDIOVERSION N/A 06/13/2016   Procedure: TRANSESOPHAGEAL ECHOCARDIOGRAM (TEE);  Surgeon: Tonny Bollman, MD;  Location: [facility] OR;  Service: Open Heart Surgery;  Laterality: N/A;   TONSILLECTOMY     TOTAL KNEE ARTHROPLASTY Left    TRANSCATHETER AORTIC VALVE REPLACEMENT, TRANSFEMORAL N/A 06/13/2016   Procedure: TRANSCATHETER AORTIC VALVE REPLACEMENT, TRANSFEMORAL;  Surgeon: Tonny Bollman, MD;  Location: [facility] OR;  Service: Open  Heart Surgery;  Laterality: N/A;    Vitals BP (!) 130/52 (BP Location: Left Arm)   Pulse 63   Temp 98.2 F (36.8 C)   Resp 16   Ht 5\' 8"  (1.727 m)   Wt 57.8 kg   SpO2 98%   BMI 19.37 kg/m     Physical Exam Constitutional:      Comments:   Cardiovascular:     Rate and Rhythm: Normal rate and regular rhythm.     Heart sounds: No murmur heard.   Pulmonary:     Effort: Pulmonary effort is normal.     Comments:   Abdominal:     Palpations: Abdomen is soft.     Tenderness:   Musculoskeletal:        General: No swelling or tenderness.   Skin:    Comments:   Neurological:     General: No focal deficit present.   Psychiatric:        Mood and Affect: Mood normal.   Pertinent Microbiology Results for orders placed or performed during the hospital encounter of 06/23/22  Culture, blood (Routine X 2) w Reflex to ID Panel     Status: Abnormal  Collection Time: 06/23/22  5:34 PM   Specimen: BLOOD  Result Value Ref Range Status   Specimen Description   Final    BLOOD RIGHT ANTECUBITAL Performed at [facility], [address], [place], [place] [address]    Special Requests   Final    BOTTLES DRAWN AEROBIC AND ANAEROBIC Blood Culture results may not be optimal due to an inadequate volume of blood received in culture bottles Performed at [facility], [address] [place], [place] [address]    Culture  Setup Time   Final    GRAM POSITIVE COCCI IN CHAINS IN BOTH AEROBIC AND ANAEROBIC BOTTLES CRITICAL RESULT CALLED TO, READ BACK BY AND VERIFIED WITH: PHARMD CRYSTAL R. [id number] FCP    Culture (A)  Final    GROUP A STREP (S.PYOGENES) ISOLATED HEALTH DEPARTMENT NOTIFIED Performed at [facility], [address] [place], [place] [address]    Report Status 06/26/2022 FINAL  Final   Organism ID, Bacteria GROUP A STREP (S.PYOGENES) ISOLATED  Final      Susceptibility   Group a strep (s.pyogenes) isolated - MIC*     PENICILLIN <=0.06 SENSITIVE Sensitive     CEFTRIAXONE <=0.12 SENSITIVE Sensitive     ERYTHROMYCIN >=8 RESISTANT Resistant     LEVOFLOXACIN 0.5 SENSITIVE Sensitive     VANCOMYCIN 0.25 SENSITIVE Sensitive     * GROUP A STREP (S.PYOGENES) ISOLATED  Culture, blood (Routine X 2) w Reflex to ID Panel     Status: Abnormal   Collection Time: 06/23/22  5:34 PM   Specimen: BLOOD LEFT FOREARM  Result Value Ref Range Status   Specimen Description   Final    BLOOD LEFT FOREARM Performed at [facility], [address], [place], [place] [address]    Special Requests   Final    BOTTLES DRAWN AEROBIC AND ANAEROBIC Blood Culture adequate volume Performed at [facility], [address] [place], [place] [address]    Culture  Setup Time   Final    GRAM POSITIVE COCCI IN CHAINS IN BOTH AEROBIC AND ANAEROBIC BOTTLES    Culture (A)  Final    GROUP A STREP (S.PYOGENES) ISOLATED SUSCEPTIBILITIES PERFORMED ON PREVIOUS CULTURE WITHIN THE LAST 5 DAYS. HEALTH DEPARTMENT NOTIFIED Performed at [facility], [address], [place], [place] [address]    Report Status 06/26/2022 FINAL  Final  Blood Culture ID Panel (Reflexed)     Status: Abnormal   Collection Time: 06/23/22  5:34 PM  Result Value Ref Range Status   Enterococcus faecalis NOT DETECTED NOT DETECTED Final   Enterococcus Faecium NOT DETECTED NOT DETECTED Final   Listeria monocytogenes NOT DETECTED NOT DETECTED Final   Staphylococcus species NOT DETECTED NOT DETECTED Final   Staphylococcus aureus (BCID) NOT DETECTED NOT DETECTED Final   Staphylococcus epidermidis NOT DETECTED NOT DETECTED Final   Staphylococcus lugdunensis NOT DETECTED NOT DETECTED Final   Streptococcus species DETECTED (A) NOT DETECTED Final    Comment: CRITICAL RESULT CALLED TO, READ BACK BY AND VERIFIED WITH: PHARMD CRYSTAL R. [id number] FCP    Streptococcus agalactiae NOT DETECTED NOT DETECTED Final   Streptococcus pneumoniae NOT DETECTED  NOT DETECTED Final   Streptococcus pyogenes DETECTED (A) NOT DETECTED Final    Comment: CRITICAL RESULT CALLED TO, READ BACK BY AND VERIFIED WITH: PHARMD CRYSTAL R. [id number] FCP    A.calcoaceticus-baumannii NOT DETECTED NOT DETECTED Final   Bacteroides fragilis NOT DETECTED NOT  DETECTED Final   Enterobacterales NOT DETECTED NOT DETECTED Final   Enterobacter cloacae complex NOT DETECTED NOT DETECTED Final   Escherichia coli NOT DETECTED NOT DETECTED Final   Klebsiella aerogenes NOT DETECTED NOT DETECTED Final   Klebsiella oxytoca NOT DETECTED NOT DETECTED Final   Klebsiella pneumoniae NOT DETECTED NOT DETECTED Final   Proteus species NOT DETECTED NOT DETECTED Final   Salmonella species NOT DETECTED NOT DETECTED Final   Serratia marcescens NOT DETECTED NOT DETECTED Final   Haemophilus influenzae NOT DETECTED NOT DETECTED Final   Neisseria meningitidis NOT DETECTED NOT DETECTED Final   Pseudomonas aeruginosa NOT DETECTED NOT DETECTED Final   Stenotrophomonas maltophilia NOT DETECTED NOT DETECTED Final   Candida albicans NOT DETECTED NOT DETECTED Final   Candida auris NOT DETECTED NOT DETECTED Final   Candida glabrata NOT DETECTED NOT DETECTED Final   Candida krusei NOT DETECTED NOT DETECTED Final   Candida parapsilosis NOT DETECTED NOT DETECTED Final   Candida tropicalis NOT DETECTED NOT DETECTED Final   Cryptococcus neoformans/gattii NOT DETECTED NOT DETECTED Final    Comment: Performed at [facility], [address] [place], [place] [address]  MRSA Next Gen by PCR, Nasal     Status: Abnormal   Collection Time: 06/24/22 12:22 PM   Specimen: Nasal Mucosa; Nasal Swab  Result Value Ref Range Status   MRSA by PCR Next Gen DETECTED (A) NOT DETECTED Final    Comment: (NOTE) The GeneXpert MRSA Assay (FDA approved for NASAL specimens only), is one component of a comprehensive MRSA colonization surveillance program. It is not intended to diagnose MRSA infection nor to guide or  monitor treatment for MRSA infections. Test performance is not FDA approved in patients less than 37 years old. Performed at [facility], [address], [place], [place] [address]   Culture, blood (Routine X 2) w Reflex to ID Panel     Status: None (Preliminary result)   Collection Time: 06/25/22 10:13 AM   Specimen: BLOOD LEFT ARM  Result Value Ref Range Status   Specimen Description   Final    BLOOD LEFT ARM Performed at [facility], [address], [place], [place] [address]    Special Requests   Final    BOTTLES DRAWN AEROBIC AND ANAEROBIC Blood Culture adequate volume Performed at [facility], [address], [place], [place] [address]    Culture   Final    NO GROWTH < 24 HOURS Performed at [facility], [address], [place], [place] [address]    Report Status PENDING  Incomplete  Culture, blood (Routine X 2) w Reflex to ID Panel     Status: None (Preliminary result)   Collection Time: 06/25/22 10:18 AM   Specimen: BLOOD LEFT HAND  Result Value Ref Range Status   Specimen Description   Final    BLOOD LEFT HAND Performed at [facility], [address], [place], [place] [address]    Special Requests   Final    AEROBIC BOTTLE ONLY Blood Culture adequate volume Performed at [facility], [address], [place], [place] [address]    Culture   Final    NO GROWTH < 24 HOURS Performed at [facility], [address], [place], [place] [address]    Report Status PENDING  Incomplete    Pertinent Lab.   Pertinent Imaging today Plain films and CT images have been personally visualized and interpreted; radiology  reports have been reviewed. Decision making incorporated into the Impression /   I have personally spent 60  minute involved in face-to-face and non-face-to-face activities for this patient on the day of the visit. Professional time spent includes the  following activities: Preparing to see the patient (review of tests), Obtaining and/or reviewing separately obtained history (admission/discharge record), Performing a medically appropriate examination and/or evaluation , Ordering medications/tests/procedures, referring and communicating with other health care professionals, Documenting clinical information in the EMR, Independently interpreting results (not separately reported), Communicating results to the patient/family/caregiver, Counseling and educating the patient/family/caregiver and Care coordination (not separately reported).   Plan d/w requesting provider as well as ID pharm D  Note: This document was prepared using dragon voice recognition software and may include unintentional dictation errors.   Electronically signed by:   Odette Fraction, MD Infectious Disease Physician [facility] for Infectious Disease Pager: [phone]

## 2022-06-26 NOTE — Progress Notes (Signed)
Noted with penile and scrotal edema, no complaints of pain, voiding without difficulty.  MD made aware via secure chat

## 2022-06-26 NOTE — Plan of Care (Signed)
  Problem: Clinical Measurements: Goal: Ability to avoid or minimize complications of infection will improve Outcome: Progressing   Problem: Skin Integrity: Goal: Skin integrity will improve Outcome: Progressing   Problem: Education: Goal: Knowledge of General Education information will improve Description: Including pain rating scale, medication(s)/side effects and non-pharmacologic comfort measures Outcome: Progressing   Problem: Health Behavior/Discharge Planning: Goal: Ability to manage health-related needs will improve Outcome: Progressing   Problem: Clinical Measurements: Goal: Ability to maintain clinical measurements within normal limits will improve Outcome: Progressing   Problem: Nutrition: Goal: Adequate nutrition will be maintained Outcome: Progressing   Problem: Pain Managment: Goal: General experience of comfort will improve Outcome: Progressing   Problem: Safety: Goal: Ability to remain free from injury will improve Outcome: Progressing   Problem: Skin Integrity: Goal: Risk for impaired skin integrity will decrease Outcome: Progressing

## 2022-06-27 ENCOUNTER — Inpatient Hospital Stay (HOSPITAL_COMMUNITY): Payer: Medicare Other

## 2022-06-27 DIAGNOSIS — I4821 Permanent atrial fibrillation: Secondary | ICD-10-CM | POA: Diagnosis not present

## 2022-06-27 DIAGNOSIS — I5023 Acute on chronic systolic (congestive) heart failure: Secondary | ICD-10-CM | POA: Diagnosis not present

## 2022-06-27 DIAGNOSIS — B950 Streptococcus, group A, as the cause of diseases classified elsewhere: Secondary | ICD-10-CM | POA: Diagnosis not present

## 2022-06-27 DIAGNOSIS — M7021 Olecranon bursitis, right elbow: Secondary | ICD-10-CM | POA: Diagnosis not present

## 2022-06-27 DIAGNOSIS — L03113 Cellulitis of right upper limb: Secondary | ICD-10-CM | POA: Diagnosis not present

## 2022-06-27 LAB — BASIC METABOLIC PANEL
Anion gap: 8 (ref 5–15)
BUN: 42 mg/dL — ABNORMAL HIGH (ref 8–23)
CO2: 23 mmol/L (ref 22–32)
Calcium: 8.2 mg/dL — ABNORMAL LOW (ref 8.9–10.3)
Chloride: 102 mmol/L (ref 98–111)
Creatinine, Ser: 1.12 mg/dL (ref 0.61–1.24)
GFR, Estimated: >60 mL/min (ref >60)
Glucose, Bld: 102 mg/dL — ABNORMAL HIGH (ref 70–99)
Potassium: 4.5 mmol/L (ref 3.5–5.1)
Sodium: 133 mmol/L — ABNORMAL LOW (ref 135–145)

## 2022-06-27 LAB — CBC
HCT: 32 % — ABNORMAL LOW (ref 39.0–52.0)
Hemoglobin: 10 g/dL — ABNORMAL LOW (ref 13.0–17.0)
MCH: 29.5 pg (ref 26.0–34.0)
MCHC: 31.3 g/dL (ref 30.0–36.0)
MCV: 94.4 fL (ref 80.0–100.0)
Platelets: 175 10*3/uL (ref 150–400)
RBC: 3.39 MIL/uL — ABNORMAL LOW (ref 4.22–5.81)
RDW: 15.9 % — ABNORMAL HIGH (ref 11.5–15.5)
WBC: 8.7 10*3/uL (ref 4.0–10.5)
nRBC: 0 % (ref 0.0–0.2)

## 2022-06-27 LAB — CULTURE, BLOOD (ROUTINE X 2): Special Requests: ADEQUATE

## 2022-06-27 LAB — PROTIME-INR
INR: 2.3 — ABNORMAL HIGH (ref 0.8–1.2)
Prothrombin Time: 25.9 seconds — ABNORMAL HIGH (ref 11.4–15.2)

## 2022-06-27 MED ORDER — FUROSEMIDE 10 MG/ML IJ SOLN
40.0000 mg | Freq: Two times a day (BID) | INTRAMUSCULAR | Status: DC
  Administered 2022-06-27: 40 mg via INTRAVENOUS
  Filled 2022-06-27: qty 4

## 2022-06-27 MED ORDER — ALBUMIN HUMAN 25 % IV SOLN
12.5000 g | Freq: Once | INTRAVENOUS | Status: AC
  Administered 2022-06-27: 12.5 g via INTRAVENOUS
  Filled 2022-06-27: qty 50

## 2022-06-27 MED ORDER — CHLORHEXIDINE GLUCONATE CLOTH 2 % EX PADS
6.0000 | MEDICATED_PAD | Freq: Every day | CUTANEOUS | Status: DC
  Administered 2022-06-27 – 2022-06-30 (×4): 6 via TOPICAL

## 2022-06-27 MED ORDER — GADOBUTROL 1 MMOL/ML IV SOLN
6.0000 mL | Freq: Once | INTRAVENOUS | Status: AC | PRN
  Administered 2022-06-27: 6 mL via INTRAVENOUS

## 2022-06-27 MED ORDER — MUPIROCIN 2 % EX OINT
1.0000 | TOPICAL_OINTMENT | Freq: Two times a day (BID) | CUTANEOUS | Status: DC
  Administered 2022-06-27 – 2022-06-30 (×7): 1 via NASAL
  Filled 2022-06-27 (×2): qty 22

## 2022-06-27 MED ORDER — WARFARIN SODIUM 2.5 MG PO TABS
2.5000 mg | ORAL_TABLET | Freq: Once | ORAL | Status: AC
  Administered 2022-06-27: 2.5 mg via ORAL
  Filled 2022-06-27: qty 1

## 2022-06-27 NOTE — Progress Notes (Addendum)
ANTICOAGULATION CONSULT NOTE  Pharmacy Consult for Warfarin Indication: atrial fibrillation  No Known Allergies  Patient Measurements: Height: 5\' 8"  (172.7 cm) Weight: 60.5 kg (133 lb 6.4 oz) IBW/kg (Calculated) : 68.4    Vital Signs: Temp: 97.8 F (36.6 C) (05/14 0452) Temp Source: Oral (05/14 0452) BP: 128/53 (05/14 0452) Pulse Rate: 56 (05/14 0452)  Labs: Recent Labs    06/25/22 0355 06/26/22 0357 06/27/22 0425  HGB 10.4* 10.2* 10.0*  HCT 32.9* 33.3* 32.0*  PLT 159 172 175  LABPROT 33.0* 28.5* 25.9*  INR 3.2* 2.7* 2.3*  CREATININE 1.24 1.14 1.12     Estimated Creatinine Clearance: 34.5 mL/min (by C-G formula based on SCr of 1.12 mg/dL).   Medical History: Past Medical History:  Diagnosis Date   AAA (abdominal aortic aneurysm) (HCC)    s/p repair   Acute on chronic systolic (congestive) heart failure (HCC) 12/22/2021   Aortic atherosclerosis (HCC)    Arthritis    knees   CAD (coronary artery disease)    a. s/p POBA 1980s, 1990s (details unclear). b. known stenotic RCA (previous unsuccessful intervention)   Cancer (HCC)    skin cancer removed, right hand, left leg, chest   Carotid artery disease (HCC)    Cholecystitis, acute 12/06/2012   Chronic anticoagulation    Coumadin   Chronic kidney disease, stage 3a (HCC)    Cirrhosis of liver (HCC)    by imaging   Coronary artery disease    GERD (gastroesophageal reflux disease)    Heart failure with mildly reduced ejection fraction (HFmrEF) (HCC)    History of pulmonary embolism    1964 related to dislocated hip on right    History of skin cancer    Hypercholesteremia    Hypertension    Incidental cecal mass noted on CT imaging 05/24/2016   **An incidental finding of potential clinical significance has been found. 4.6 x 5.6 x 3.7 cm masslike lesion in the cecum adjacent to the ileocecal valve may represent a large polyp or colonic neoplasm. Correlation with nonemergent colonoscopy is strongly recommended  in the near future to better evaluate this finding.**   Macular degeneration    eye injections   Microscopic hematuria    Followed by urology   Permanent atrial fibrillation 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  On Warfarin PTA for h/o AFib, s/p TAVR, HTN, CKD, HF INR on admission = 4.1 (supratherapeutic) Per review of notes, some bleeding from right arm wound noted 5/11  Today, 06/27/22  - INR = 2.3, at goal (trended down after holding for 3 days) - Hgb=10 g/dL, low but stable - Plt = 175 K/uL, WNL - Drug-Drug Interactions: linezolid started 5/13   Goal of Therapy:  INR 2-3 Monitor platelets by anticoagulation protocol: Yes   Plan:  - Give 2.5mg , home dose today - Daily PT/INR and CBC - Continue to monitor H&H and platelets - Monitor signs/symptoms of bleeding  Tacy Learn, PharmD Clinical Pharmacist 06/27/2022 10:54 AM

## 2022-06-27 NOTE — Progress Notes (Signed)
Progress Note   Patient: Tyler Williams ZOX:[id number] DOB: 12-Oct-1926 DOA: 06/23/2022     3 DOS: the patient was seen and examined on 06/27/2022   Brief hospital course: 89yo with h/o chronic systolic CHF, afib, h/o TAVR, HTN, and stage 3a CKD presenting from ALF with a fall.  R arm skin tear with worsening erythema and fever.  WBC 15.1.  CT R forearm done.  Diagnosed with RUE cellulitis, treated with Rocephin, Vanc.  Wound care consulted.  Noted to have olecranon bursitis of the R elbow; discussed with orthopedics (Dr. Aundria Rud) who recommended antibiotics and elevation, but no surgical intervention needed at this time.  Infectious disease following and patient on Ancef.  Workup revealed also patient in acute systolic heart failure and started on IV Lasix.   Assessment and Plan: * Right arm cellulitis -Patient with fall resulting in skin tear to R forearm now presenting with erythema and edema of the R lower arm and extending above the elbow.  White blood cell count improving.  Currently on Ancef.  ID following.  Wound care orders added by orthopedic surgery.  2D echo noting no evidence of valvular vegetation.  To decide on TEE.    Olecranon bursitis of right elbow -EDP discussed case with Dr. Aundria Rud with orthopedics, who recommended antibiotics and elevation.  After positive blood cultures, formal consultation done.  Orthopedic on-call discussed with hand surgery on-call and added wound care orders as well for cellulitis as above  DNR (do not resuscitate)/DNI(Do Not Intubate) -Verified DNR/DNI status with patient/son.  -Pt will need yellow DNR form completed prior to discharge.   Acute on chronic systolic CHF (congestive heart failure) (HCC) Repeat echo during this hospitalization she notes improvement from previous echo 6 months ago.  Now with ejection fraction of 45 to 50% with global hypokinesis and indeterminate diastolic function. -Continue Imdur, Toprol XL, Entresto,  spironolactone BNP elevated on 5/12 at 1136.  Patient started on IV Lasix.  He is normally on 40 mg p.o. daily as needed.  Have given IV albumin x 2 doses.  Appears to still be net positive.  Will recheck BNP in the morning.  CKD stage 3a, GFR 45-59 ml/min (HCC) -Appears to be stable at this time -Attempt to avoid nephrotoxic medications Renal function improved with creatinine despite Lasix started 5/12.  Creatinine currently remaining stable at 1.12 with a GFR greater than 60. (Which is better than baseline despite Lasix)  Permanent atrial fibrillation (HCC) with supratherapeutic INR -Rate controlled with Toprol XL -On Coumadin, dosing per pharmacy.  INR supratherapeutic on admission at 4.1, likely secondary to acute CHF causing hepatic congestion causing decreased Coumadin clearance.  INR has been trending down with Coumadin held.  Now finally therapeutic and despite diuresis, stable.  INR today at 2.3.  Essential hypertension -Continue Toprol XL, Entresto, spironolactone, blood pressure remained stable  Anxiety disorder -Continue Ativan  BPH (benign prostatic hyperplasia) -Continue Toviaz, finasteride, tamsulosin  Pressure injury of skin -R forearm skin tear Pressure Injury 06/24/22 Buttocks Right;Left Stage 1 -  Intact skin with non-blanchable redness of a localized area usually over a bony prominence. redness (Active)  06/24/22 0354  Location: Buttocks  Location Orientation: Right;Left  Staging: Stage 1 -  Intact skin with non-blanchable redness of a localized area usually over a bony prominence.  Wound Description (Comments): redness  Present on Admission: Yes  -Wound care consulted  Streptococcal bacteremia -BCID has returned Strep bacteremia, with repeat blood cultures checked 5/12.  These are no growth to  date -Pior h/o MRSA PCR + in 10/23; now again PCR positive -ID consulted and patient switched to cefazolin monotherapy.  Echocardiogram unremarkable for valvular  vegetation.  TEE decision to be made.  Constipation -Continue home lactulose, Miralax  Macular degeneration -Significantly visually impaired -Continue preservision  HYPERCHOLESTEROLEMIA  IIA -Continue atorvastatin  Overweight: Meets criteria with BMI greater than 30  Subjective: Patient with no complaints  Physical Exam: Vitals:   06/26/22 1329 06/26/22 1936 06/27/22 0452 06/27/22 0500  BP: (!) 130/52 (!) 128/53 (!) 128/53   Pulse: 63 70 (!) 56   Resp: 16 19 16    Temp: 98.2 F (36.8 C) 98.1 F (36.7 C) 97.8 F (36.6 C)   TempSrc:  Oral Oral   SpO2: 98% 98% 96%   Weight:    60.5 kg  Height:       General: Comfortable, alert and oriented x 2, no acute distress HEENT: Normocephalic, atraumatic, mucous membranes slightly dry Cardiovascular: Regular rhythm, S1-S2, 2 out of 6 systolic ejection murmur Respiratory: Clear to auscultation bilaterally Abdomen: Soft, nontender, nondistended, positive bowel sounds Skin: Currently right forearm is wrapped.  Bilateral lower extremity stasis dermatitis.  See pictures below from 5/11.             Musculoskeletal:  no bony abnormality Psychiatric:  grossly normal mood and affect, speech fluent and appropriate, AOx3   Radiological Exams on Admission: Independently reviewed - see discussion in A/P where applicable  No results found.  EKG: none   Labs: INR at 2.3, sodium of 133, creatinine of 1.12.  Normalized white blood cell count with stable hemoglobin at 10  Family Communication: Will call family  Consults:  ID, orthopedics, wound care  Disposition: Status is: Inpatient -Diuresing -Continued improvement in cellulitis  Author: Hollice Espy, MD 06/27/2022 12:05 PM  For on call review [email or web address].

## 2022-06-28 DIAGNOSIS — I872 Venous insufficiency (chronic) (peripheral): Secondary | ICD-10-CM

## 2022-06-28 DIAGNOSIS — K7460 Unspecified cirrhosis of liver: Secondary | ICD-10-CM

## 2022-06-28 DIAGNOSIS — L03113 Cellulitis of right upper limb: Secondary | ICD-10-CM | POA: Diagnosis not present

## 2022-06-28 DIAGNOSIS — B950 Streptococcus, group A, as the cause of diseases classified elsewhere: Secondary | ICD-10-CM | POA: Diagnosis not present

## 2022-06-28 LAB — CBC
HCT: 32.9 % — ABNORMAL LOW (ref 39.0–52.0)
Hemoglobin: 10.6 g/dL — ABNORMAL LOW (ref 13.0–17.0)
MCH: 30.5 pg (ref 26.0–34.0)
MCHC: 32.2 g/dL (ref 30.0–36.0)
MCV: 94.8 fL (ref 80.0–100.0)
Platelets: 172 10*3/uL (ref 150–400)
RBC: 3.47 MIL/uL — ABNORMAL LOW (ref 4.22–5.81)
RDW: 15.9 % — ABNORMAL HIGH (ref 11.5–15.5)
WBC: 7.4 10*3/uL (ref 4.0–10.5)
nRBC: 0 % (ref 0.0–0.2)

## 2022-06-28 LAB — BASIC METABOLIC PANEL
Anion gap: 8 (ref 5–15)
BUN: 40 mg/dL — ABNORMAL HIGH (ref 8–23)
CO2: 26 mmol/L (ref 22–32)
Calcium: 8.3 mg/dL — ABNORMAL LOW (ref 8.9–10.3)
Chloride: 99 mmol/L (ref 98–111)
Creatinine, Ser: 1.23 mg/dL (ref 0.61–1.24)
GFR, Estimated: 54 mL/min — ABNORMAL LOW (ref >60)
Glucose, Bld: 91 mg/dL (ref 70–99)
Potassium: 4.6 mmol/L (ref 3.5–5.1)
Sodium: 133 mmol/L — ABNORMAL LOW (ref 135–145)

## 2022-06-28 LAB — PROTIME-INR
INR: 2.2 — ABNORMAL HIGH (ref 0.8–1.2)
Prothrombin Time: 24.5 seconds — ABNORMAL HIGH (ref 11.4–15.2)

## 2022-06-28 LAB — BRAIN NATRIURETIC PEPTIDE: B Natriuretic Peptide: 1228 pg/mL — ABNORMAL HIGH (ref 0.0–100.0)

## 2022-06-28 MED ORDER — FUROSEMIDE 40 MG PO TABS
60.0000 mg | ORAL_TABLET | Freq: Every day | ORAL | Status: DC
  Administered 2022-06-28 – 2022-06-30 (×3): 60 mg via ORAL
  Filled 2022-06-28 (×3): qty 2

## 2022-06-28 MED ORDER — WARFARIN SODIUM 2.5 MG PO TABS
2.5000 mg | ORAL_TABLET | Freq: Once | ORAL | Status: AC
  Administered 2022-06-28: 2.5 mg via ORAL
  Filled 2022-06-28: qty 1

## 2022-06-28 NOTE — Progress Notes (Signed)
PROGRESS NOTE    Tyler Williams  OZH:[id number] DOB: 06-Apr-1926 DOA: 06/23/2022 PCP: Tyler Paradise, MD    Brief Narrative:   Tyler Williams is a 89 y.o. male with past medical history significant for chronic systolic CHF, afib, h/o TAVR, HTN, and stage 3a CKD presenting from ALF with a fall. R arm skin tear with worsening erythema and fever. WBC 15.1. CT R forearm done. Diagnosed with RUE cellulitis, treated with Rocephin, Vanc. Wound care consulted. Noted to have olecranon bursitis of the R elbow; discussed with orthopedics (Dr. Aundria Williams) who recommended antibiotics and elevation, but no surgical intervention needed at this time. Infectious disease following and patient on Ancef.   Assessment & Plan:   Right arm cellulitis Patient with fall resulting in skin tear to R forearm now presenting with erythema and edema of the R lower arm and extending above the elbow.  TTE with no evidence of valvular vegetation.   MR right forearm with mild edema olecranon process and proximal ulnar with mild enhancement concerning for early osteomyelitis, skin thickening/subcu soft tissue edema posterior aspect proximal forearm with small partially imaged fluid collection concerning for small abscess, small elbow joint effusion.  Reevaluated by orthopedics, Dr. Orlan Williams on 5/15, no surgical intervention required at this time and to continue supportive care, IV antibiotics. -- WBC 15.1>20.5>16.0>12.0>8.7>7.4 -- Linezolid 600 mg p.o. every 12 hours -- Cefazolin 2 g IV every 8 hours -- Wound care RUE, wash with skin cleanser, pat dry, cover with Xeroform gauze top with ABD pad and secure with Kerlix, change daily   Olecranon bursitis of right elbow EDP discussed case with Dr. Aundria Williams with orthopedics, who recommended antibiotics and elevation.  After positive blood cultures, formal consultation done.  Orthopedic on-call discussed with hand surgery on-call and added wound care orders as well for cellulitis  as above.  MR right elbow 5/14 with peripherally enhancing fluid collection posterior aspect elbow joint measuring 1.8 x 3.2 x 5.5 cm with peripherally enhancing fluid collection posterior aspect elbow joint measuring 1.8 x 3.2 x 5.5 cm consistent with olecranon bursitis however due to skin irregularity and surrounding subcu soft tissue edema abscess cannot be excluded, small elbow joint effusion, mildly increased signal proximal ulnar could be reactive versus early osteomyelitis cannot be excluded.  Reengagement with orthopedics 5/115, in which PA-C discussed with Dr. Orlan Williams who reviewed the MRIs with no indication for surgery at this point with recommendation of a posterior arm splint for comfort, wound care and IV antibiotics per ID.  Streptococcal bacteremia Blood cultures x 2 5/10 positive for strep pyogenes.  Transthoracic echocardiogram unremarkable for venular vegetation. -- ID following, appreciate assistance -- Repeat blood culture 5/12: No growth x 3 days -- ID currently deferring TEE at this time -- Linezolid 600 mg p.o. twice daily -- Cefazolin 2 g IV every 8 hours   Acute on chronic systolic CHF (congestive heart failure) (HCC) Repeat echo during this hospitalization she notes improvement from previous echo 6 months ago.  Now with ejection fraction of 45 to 50% with global hypokinesis and indeterminate diastolic function. BNP elevated on 5/12 at 1136.  Patient started on IV Lasix.  He is normally on 60 mg p.o. daily as needed.  Have given IV albumin x 2 doses. -- Continue Imdur, Toprol XL, Entresto, spironolactone -- Transition IV furosemide to 60 mg p.o. daily today -- Strict I's and O's and daily weights -- Repeat BMP/BNP in a.m.   CKD stage 3a, GFR 45-59 ml/min (HCC) -- stable  at this time, Cr 1.23 -- avoid nephrotoxic medications -- BMP in am    Permanent atrial fibrillation  Supratherapeutic INR: resolved INR supratherapeutic on admission at 4.1, likely secondary to acute  CHF causing hepatic congestion causing decreased Coumadin clearance.  INR level now within normal limits, Coumadin resumed. -- Rate controlled with metoprolol succinate 12.5 mg p.o. daily -- Continue Coumadin, dosing per pharmacy; INR 2.2 today   Essential hypertension -- Metoprolol succinate 12.5 mg p.o. daily -- Entresto 24-26 mg p.o. twice daily -- Spironolactone 25 mg p.o. daily   Anxiety disorder -- Continue Ativan   BPH (benign prostatic hyperplasia) -- Continue Toviaz, finasteride, tamsulosin   Pressure injury of skin R forearm skin tear Pressure Injury 06/24/22 Buttocks Right;Left Stage 1 -  Intact skin with non-blanchable redness of a localized area usually over a bony prominence. redness (Active)  06/24/22 0354  Location: Buttocks  Location Orientation: Right;Left  Staging: Stage 1 -  Intact skin with non-blanchable redness of a localized area usually over a bony prominence.  Wound Description (Comments): redness  Present on Admission: Yes  -- Seen by wound RN -- Wound care to bilateral lower extremities, wash and pat gently dry, cover open areas with full layers of Xeroform gauze top with dry gauze, secure with Kerlix applied from toes to knee with paper tape, Prevalon boots -- Wound care RUE, wash with skin cleanser, pat dry, cover with Xeroform gauze top with ABD pad and secure with Kerlix, change daily  Constipation -- Continue home lactulose, Miralax   Macular degeneration Significantly visually impaired --Supportive care   Dyslipidemia -- Continue atorvastatin 20 mg p.o. daily   Overweight: Meets criteria with BMI greater than 30  DNR (do not resuscitate)/DNI(Do Not Intubate) -- Verified DNR/DNI status with patient/son.  -- Pt will need yellow DNR form completed prior to discharge.    DVT prophylaxis: SCDs Start: 06/24/22 0118    Code Status: DNR Family Communication: No family present at bedside this morning, updated patient's daughter Tyler Williams via  telephone this afternoon  Disposition Plan:  Level of care: Med-Surg Status is: Inpatient Remains inpatient appropriate because: IV antibiotics, from [facility], ALF    Consultants:  Orthopedics, Dr. Orlan Williams Infectious disease  Procedures:  TTE  Antimicrobials:  Vancomycin 5/10 - 5/10 Ceftriaxone 5/10 - 5/10 Cefazolin 5/11>> Linezolid 5/13>>   Subjective: Patient seen and examined at bedside, resting comfortably.  Lying in bed.  No family present.  Continues with mild tenderness to right posterior aspect of his elbow and lateral aspect, but has good range of motion with minimal symptoms.  Underwent MRI right forearm/elbow yesterday with concern of fluid collection, possible abscess and underlying osteomyelitis.  Orthopedics reengaged today, reviewed MRIs and no need for surgical invention at this time and continue supportive care, IV antibiotics.  No other specific questions or concerns at this time.  Patient denies headache, no dizziness, no chest pain, no palpitations, no shortness of breath, no abdominal pain, no fever/chills, no nausea/vomiting/diarrhea.  No acute concerns overnight per nurse staff.  Objective: Vitals:   06/27/22 0500 06/27/22 1221 06/27/22 2037 06/28/22 0458  BP:  (!) 119/56 (!) 169/77 (!) 127/57  Pulse:  (!) 56 72 (!) 55  Resp:   17 13  Temp:  (!) 97.5 F (36.4 C) 97.9 F (36.6 C) 97.6 F (36.4 C)  TempSrc:  Oral Oral Oral  SpO2:  96% 96% 99%  Weight: 60.5 kg     Height:        Intake/Output Summary (  Last 24 hours) at 06/28/2022 1132 Last data filed at 06/28/2022 0500 Gross per 24 hour  Intake --  Output 900 ml  Net -900 ml   Filed Weights   06/24/22 0343 06/26/22 0600 06/27/22 0500  Weight: 78 kg 57.8 kg 60.5 kg    Examination:  Physical Exam: GEN: NAD, alert and oriented x 3, chronically ill/elderly in appearance HEENT: NCAT, PERRL, EOMI, sclera clear, MMM PULM: CTAB w/o wheezes/crackles, normal respiratory effort on room air CV: RRR  w/ SEM , no gallop/rub GI: abd soft, NTND, NABS, no R/G/M MSK: Moves all extremities independently, slightly tender to palpation posterior aspect right elbow and lateral olecranon process, normal passive/active range of motion with minimal pain NEURO: CN II-XII intact, no focal deficits, sensation to light touch intact PSYCH: normal mood/affect Integumentary: Chronic wound/skin changes and right elbow/forearm as depicted below             Data Reviewed: I have personally reviewed following labs and imaging studies  CBC: Recent Labs  Lab 06/23/22 1734 06/24/22 0412 06/25/22 0355 06/26/22 0357 06/27/22 0425 06/28/22 0410  WBC 15.1* 20.5* 16.0* 12.0* 8.7 7.4  NEUTROABS 13.5* 18.4* 14.7*  --   --   --   HGB 11.1* 10.9* 10.4* 10.2* 10.0* 10.6*  HCT 35.4* 34.7* 32.9* 33.3* 32.0* 32.9*  MCV 94.9 95.9 95.1 97.4 94.4 94.8  PLT 186 167 159 172 175 172   Basic Metabolic Panel: Recent Labs  Lab 06/24/22 0412 06/25/22 0355 06/26/22 0357 06/27/22 0425 06/28/22 0410  NA 135 133* 133* 133* 133*  K 4.3 4.5 4.3 4.5 4.6  CL 101 102 102 102 99  CO2 25 21* 21* 23 26  GLUCOSE 122* 116* 93 102* 91  BUN 37* 41* 42* 42* 40*  CREATININE 1.43* 1.24 1.14 1.12 1.23  CALCIUM 8.5* 8.5* 8.1* 8.2* 8.3*  MG 2.4  --   --   --   --    GFR: Estimated Creatinine Clearance: 31.4 mL/min (by C-G formula based on SCr of 1.23 mg/dL). Liver Function Tests: Recent Labs  Lab 06/24/22 0412  AST 17  ALT 15  ALKPHOS 105  BILITOT 1.3*  PROT 6.8  ALBUMIN 2.4*   No results for input(s): "LIPASE", "AMYLASE" in the last 168 hours. No results for input(s): "AMMONIA" in the last 168 hours. Coagulation Profile: Recent Labs  Lab 06/24/22 0412 06/25/22 0355 06/26/22 0357 06/27/22 0425 06/28/22 0410  INR 3.7* 3.2* 2.7* 2.3* 2.2*   Cardiac Enzymes: No results for input(s): "CKTOTAL", "CKMB", "CKMBINDEX", "TROPONINI" in the last 168 hours. BNP (last 3 results) No results for input(s): "PROBNP" in  the last 8760 hours. HbA1C: No results for input(s): "HGBA1C" in the last 72 hours. CBG: No results for input(s): "GLUCAP" in the last 168 hours. Lipid Profile: No results for input(s): "CHOL", "HDL", "LDLCALC", "TRIG", "CHOLHDL", "LDLDIRECT" in the last 72 hours. Thyroid Function Tests: No results for input(s): "TSH", "T4TOTAL", "FREET4", "T3FREE", "THYROIDAB" in the last 72 hours. Anemia Panel: No results for input(s): "VITAMINB12", "FOLATE", "FERRITIN", "TIBC", "IRON", "RETICCTPCT" in the last 72 hours. Sepsis Labs: Recent Labs  Lab 06/23/22 1734 06/26/22 1230  PROCALCITON  --  0.18  LATICACIDVEN 1.2  --     Recent Results (from the past 240 hour(s))  Culture, blood (Routine X 2) w Reflex to ID Panel     Status: Abnormal   Collection Time: 06/23/22  5:34 PM   Specimen: BLOOD  Result Value Ref Range Status   Specimen Description  Final    BLOOD RIGHT ANTECUBITAL Performed at [facility], [address] [place], [place] [address]    Special Requests   Final    BOTTLES DRAWN AEROBIC AND ANAEROBIC Blood Culture results may not be optimal due to an inadequate volume of blood received in culture bottles Performed at [facility], [address], [place], [place] [address]    Culture  Setup Time   Final    GRAM POSITIVE COCCI IN CHAINS IN BOTH AEROBIC AND ANAEROBIC BOTTLES CRITICAL RESULT CALLED TO, READ BACK BY AND VERIFIED WITH: PHARMD CRYSTAL R. [id number] FCP    Culture (A)  Final    GROUP A STREP (S.PYOGENES) ISOLATED HEALTH DEPARTMENT NOTIFIED Performed at [facility], [address] [place], [place] [address]    Report Status 06/26/2022 FINAL  Final   Organism ID, Bacteria GROUP A STREP (S.PYOGENES) ISOLATED  Final      Susceptibility   Group a strep (s.pyogenes) isolated - MIC*    PENICILLIN <=0.06 SENSITIVE Sensitive     CEFTRIAXONE <=0.12 SENSITIVE Sensitive     ERYTHROMYCIN >=8 RESISTANT Resistant      LEVOFLOXACIN 0.5 SENSITIVE Sensitive     VANCOMYCIN 0.25 SENSITIVE Sensitive     * GROUP A STREP (S.PYOGENES) ISOLATED  Culture, blood (Routine X 2) w Reflex to ID Panel     Status: Abnormal   Collection Time: 06/23/22  5:34 PM   Specimen: BLOOD LEFT FOREARM  Result Value Ref Range Status   Specimen Description   Final    BLOOD LEFT FOREARM Performed at [facility], [address] [place], [place] [address]    Special Requests   Final    BOTTLES DRAWN AEROBIC AND ANAEROBIC Blood Culture adequate volume Performed at [facility], [address], [place], [place] [address]    Culture  Setup Time   Final    GRAM POSITIVE COCCI IN CHAINS IN BOTH AEROBIC AND ANAEROBIC BOTTLES    Culture (A)  Final    GROUP A STREP (S.PYOGENES) ISOLATED SUSCEPTIBILITIES PERFORMED ON PREVIOUS CULTURE WITHIN THE LAST 5 DAYS. HEALTH DEPARTMENT NOTIFIED Performed at [facility], [address] [place], [place] [address]    Report Status 06/26/2022 FINAL  Final  Blood Culture ID Panel (Reflexed)     Status: Abnormal   Collection Time: 06/23/22  5:34 PM  Result Value Ref Range Status   Enterococcus faecalis NOT DETECTED NOT DETECTED Final   Enterococcus Faecium NOT DETECTED NOT DETECTED Final   Listeria monocytogenes NOT DETECTED NOT DETECTED Final   Staphylococcus species NOT DETECTED NOT DETECTED Final   Staphylococcus aureus (BCID) NOT DETECTED NOT DETECTED Final   Staphylococcus epidermidis NOT DETECTED NOT DETECTED Final   Staphylococcus lugdunensis NOT DETECTED NOT DETECTED Final   Streptococcus species DETECTED (A) NOT DETECTED Final    Comment: CRITICAL RESULT CALLED TO, READ BACK BY AND VERIFIED WITH: PHARMD CRYSTAL R. [id number] FCP    Streptococcus agalactiae NOT DETECTED NOT DETECTED Final   Streptococcus pneumoniae NOT DETECTED NOT DETECTED Final   Streptococcus pyogenes DETECTED (A) NOT DETECTED Final    Comment: CRITICAL RESULT CALLED TO, READ BACK BY  AND VERIFIED WITH: PHARMD CRYSTAL R. [id number] FCP    A.calcoaceticus-baumannii NOT DETECTED NOT DETECTED Final   Bacteroides fragilis NOT DETECTED NOT DETECTED Final   Enterobacterales NOT DETECTED NOT DETECTED Final   Enterobacter cloacae complex NOT DETECTED NOT DETECTED Final  Escherichia coli NOT DETECTED NOT DETECTED Final   Klebsiella aerogenes NOT DETECTED NOT DETECTED Final   Klebsiella oxytoca NOT DETECTED NOT DETECTED Final   Klebsiella pneumoniae NOT DETECTED NOT DETECTED Final   Proteus species NOT DETECTED NOT DETECTED Final   Salmonella species NOT DETECTED NOT DETECTED Final   Serratia marcescens NOT DETECTED NOT DETECTED Final   Haemophilus influenzae NOT DETECTED NOT DETECTED Final   Neisseria meningitidis NOT DETECTED NOT DETECTED Final   Pseudomonas aeruginosa NOT DETECTED NOT DETECTED Final   Stenotrophomonas maltophilia NOT DETECTED NOT DETECTED Final   Candida albicans NOT DETECTED NOT DETECTED Final   Candida auris NOT DETECTED NOT DETECTED Final   Candida glabrata NOT DETECTED NOT DETECTED Final   Candida krusei NOT DETECTED NOT DETECTED Final   Candida parapsilosis NOT DETECTED NOT DETECTED Final   Candida tropicalis NOT DETECTED NOT DETECTED Final   Cryptococcus neoformans/gattii NOT DETECTED NOT DETECTED Final    Comment: Performed at [facility], [address] [place], [place] [address]  MRSA Next Gen by PCR, Nasal     Status: Abnormal   Collection Time: 06/24/22 12:22 PM   Specimen: Nasal Mucosa; Nasal Swab  Result Value Ref Range Status   MRSA by PCR Next Gen DETECTED (A) NOT DETECTED Final    Comment: (NOTE) The GeneXpert MRSA Assay (FDA approved for NASAL specimens only), is one component of a comprehensive MRSA colonization surveillance program. It is not intended to diagnose MRSA infection nor to guide or monitor treatment for MRSA infections. Test performance is not FDA approved in patients less than 50 years old. Performed at  [facility], [address], [place], [place] [address]   Culture, blood (Routine X 2) w Reflex to ID Panel     Status: None (Preliminary result)   Collection Time: 06/25/22 10:13 AM   Specimen: BLOOD LEFT ARM  Result Value Ref Range Status   Specimen Description   Final    BLOOD LEFT ARM Performed at [facility], [address], [place], [place] [address]    Special Requests   Final    BOTTLES DRAWN AEROBIC AND ANAEROBIC Blood Culture adequate volume Performed at [facility], [address] [place], [place] [address]    Culture   Final    NO GROWTH 3 DAYS Performed at [facility], [address], [place], [place] [address]    Report Status PENDING  Incomplete  Culture, blood (Routine X 2) w Reflex to ID Panel     Status: None (Preliminary result)   Collection Time: 06/25/22 10:18 AM   Specimen: BLOOD LEFT HAND  Result Value Ref Range Status   Specimen Description   Final    BLOOD LEFT HAND Performed at [facility], [address], [place], [place] [address]    Special Requests   Final    AEROBIC BOTTLE ONLY Blood Culture adequate volume Performed at [facility], [address] [place], [place] [address]    Culture   Final    NO GROWTH 3 DAYS Performed at [facility], [address], [place], [place] [address]    Report Status PENDING  Incomplete         Radiology Studies: MR ELBOW RIGHT W WO CONTRAST  Result Date: 06/27/2022 CLINICAL DATA:  Joint effusion.  Elbow cellulitis/bursitis. EXAM: MRI OF THE RIGHT ELBOW WITHOUT AND WITH CONTRAST TECHNIQUE: Multiplanar, multisequence MR imaging of the elbow  was performed before and after the administration of intravenous contrast. CONTRAST:  6mL GADAVIST GADOBUTROL 1 MMOL/ML IV SOLN COMPARISON:  None Available. FINDINGS: TENDONS Common forearm flexor origin: Intact Common forearm extensor origin: Intact Biceps:  Intact Triceps: Intact LIGAMENTS Medial stabilizers: Intact Lateral stabilizers:  Intact Cartilage: No chondral defect. Joint: Small elbow joint effusion. Cubital tunnel: Normal. Bones: No fracture or dislocation. Mildly increased signal of the proximal ulna, which is likely reactive secondary to adjacent inflammatory process. Soft Tissues: There is a peripherally enhancing fluid collection about the posterior aspect of the elbow joint which measures at least 1.8 x 3.2 x 5.5 cm. This may represent olecranon bursitis, however due to skin irregularity and surrounding subcutaneous soft tissue edema an abscess can not be excluded, clinical correlation is suggested. IMPRESSION: 1. There is a peripherally enhancing fluid collection about the posterior aspect of the elbow joint which measures at least 1.8 x 3.2 x 5.5 cm. This may represent olecranon bursitis, however due to skin irregularity and surrounding subcutaneous soft tissue edema an abscess can not be excluded, clinical correlation is suggested. 2. Small elbow joint effusion. 3. Mildly increased signal of the proximal ulna, which could be reactive secondary to adjacent bursitis/inflammatory changes, however if there is adjacent skin wound, early osteomyelitis can not be excluded. Electronically Signed   By: Larose Hires D.O.   On: 06/27/2022 23:54   MR FOREARM RIGHT W WO CONTRAST  Result Date: 06/27/2022 CLINICAL DATA:  Osteomyelitis suspected.  Open wound. EXAM: MRI OF THE RIGHT FOREARM WITHOUT AND WITH CONTRAST TECHNIQUE: Multiplanar, multisequence MR imaging of the right forearm was performed before and after the administration of intravenous contrast. CONTRAST:  6mL GADAVIST GADOBUTROL 1 MMOL/ML IV SOLN COMPARISON:  None Available. FINDINGS: Bones/Joint/Cartilage There is mild edema about the olecranon process and proximal ulna with enhancement on post contrast sequences concerning for early osteomyelitis. Marrow signal within the radius and distal ulna is  within normal limits. There is small elbow joint effusion. Ligaments Intact. Muscles and Tendons There is generalized muscle atrophy of the flexor and extensor muscles of the forearm. No evidence of intramuscular fluid collection or abscess. Tendons of the flexor and extensor compartment appear intact. Soft tissues Skin thickening and subcutaneous soft tissue edema about the posterior aspect of the proximal forearm. There is enhancement of the soft tissues with a small partially imaged fluid collection concerning for small abscess/fluid collection. IMPRESSION: 1. Mild edema about the olecranon process and proximal ulna with enhancement on post contrast sequences concerning for early osteomyelitis. 2. Skin thickening and subcutaneous soft tissue edema about the posterior aspect of the proximal forearm with small partially imaged fluid collection concerning for small abscess/fluid collection. 3. Small elbow joint effusion. Electronically Signed   By: Larose Hires D.O.   On: 06/27/2022 23:40        Scheduled Meds:  atorvastatin  20 mg Oral Daily   Chlorhexidine Gluconate Cloth  6 each Topical Q0600   fesoterodine  8 mg Oral Daily   finasteride  5 mg Oral Daily   furosemide  60 mg Oral Daily   isosorbide mononitrate  30 mg Oral Daily   lactulose  20 g Oral Daily   linezolid  600 mg Oral Q12H   metoprolol succinate  12.5 mg Oral Daily   multivitamin  1 tablet Oral Daily   mupirocin ointment  1 Application Nasal BID   polyethylene glycol  17 g Oral QODAY   sacubitril-valsartan  1 tablet Oral BID   spironolactone  25 mg Oral Daily   tamsulosin  0.4 mg Oral Daily   Warfarin - Pharmacist Dosing Inpatient   Does not apply q1600   Continuous Infusions:   ceFAZolin (ANCEF) IV 2 g (06/28/22 0504)     LOS: 4 days    Time spent: 50 minutes spent on chart review, discussion with nursing staff, consultants, updating family and interview/physical exam; more than 50% of that time was spent in counseling  and/or coordination of care.    Alvira Philips [place], DO [facility] Available via Epic secure chat 7am-7pm After these hours, please refer to coverage provider listed on [email or web address] 06/28/2022, 11:32 AM

## 2022-06-28 NOTE — Plan of Care (Signed)
  Problem: Clinical Measurements: Goal: Ability to avoid or minimize complications of infection will improve Outcome: Progressing   Problem: Clinical Measurements: Goal: Ability to maintain clinical measurements within normal limits will improve Outcome: Progressing   Problem: Activity: Goal: Risk for activity intolerance will decrease Outcome: Progressing   Problem: Nutrition: Goal: Adequate nutrition will be maintained Outcome: Progressing   Problem: Pain Managment: Goal: General experience of comfort will improve Outcome: Progressing   Problem: Elimination: Goal: Will not experience complications related to bowel motility Outcome: Progressing   Problem: Safety: Goal: Ability to remain free from injury will improve Outcome: Progressing   Problem: Skin Integrity: Goal: Risk for impaired skin integrity will decrease Outcome: Progressing

## 2022-06-28 NOTE — Care Management Important Message (Signed)
Important Message  Patient Details IM Letter given Name: Tyler Williams MRN: [id number] Date of Birth: 26-Mar-1926   Medicare Important Message Given:  Yes     Caren Macadam 06/28/2022, 11:41 AM

## 2022-06-28 NOTE — Progress Notes (Signed)
ANTICOAGULATION CONSULT NOTE  Pharmacy Consult for Warfarin Indication: atrial fibrillation  No Known Allergies  Patient Measurements: Height: 5\' 8"  (172.7 cm) Weight: 60.5 kg (133 lb 6.4 oz) IBW/kg (Calculated) : 68.4    Vital Signs: Temp: 97.6 F (36.4 C) (05/15 0458) Temp Source: Oral (05/15 0458) BP: 127/57 (05/15 0458) Pulse Rate: 55 (05/15 0458)  Labs: Recent Labs    06/26/22 0357 06/27/22 0425 06/28/22 0410  HGB 10.2* 10.0* 10.6*  HCT 33.3* 32.0* 32.9*  PLT 172 175 172  LABPROT 28.5* 25.9* 24.5*  INR 2.7* 2.3* 2.2*  CREATININE 1.14 1.12 1.23     Estimated Creatinine Clearance: 31.4 mL/min (by C-G formula based on SCr of 1.23 mg/dL).   Medical History: Past Medical History:  Diagnosis Date   AAA (abdominal aortic aneurysm) (HCC)    s/p repair   Acute on chronic systolic (congestive) heart failure (HCC) 12/22/2021   Aortic atherosclerosis (HCC)    Arthritis    knees   CAD (coronary artery disease)    a. s/p POBA 1980s, 1990s (details unclear). b. known stenotic RCA (previous unsuccessful intervention)   Cancer (HCC)    skin cancer removed, right hand, left leg, chest   Carotid artery disease (HCC)    Cholecystitis, acute 12/06/2012   Chronic anticoagulation    Coumadin   Chronic kidney disease, stage 3a (HCC)    Cirrhosis of liver (HCC)    by imaging   Coronary artery disease    GERD (gastroesophageal reflux disease)    Heart failure with mildly reduced ejection fraction (HFmrEF) (HCC)    History of pulmonary embolism    1964 related to dislocated hip on right    History of skin cancer    Hypercholesteremia    Hypertension    Incidental cecal mass noted on CT imaging 05/24/2016   **An incidental finding of potential clinical significance has been found. 4.6 x 5.6 x 3.7 cm masslike lesion in the cecum adjacent to the ileocecal valve may represent a large polyp or colonic neoplasm. 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  On Warfarin PTA for h/o AFib, s/p TAVR, HTN, CKD, HF INR on admission = 4.1 (supratherapeutic) Per review of notes, some bleeding from right arm wound noted 5/11  Today, 06/28/22  - INR = 2.2, at goal  - Hgb=10.6 g/dL, low but stable - Plt = 172 K/uL, WNL - Drug-Drug Interactions: linezolid started 5/13   Goal of Therapy:  INR 2-3 Monitor platelets by anticoagulation protocol: Yes   Plan:  - Give 2.5mg , home dose today - Daily PT/INR and CBC - Continue to monitor H&H and platelets - Monitor signs/symptoms of bleeding  Tacy Learn, PharmD Clinical Pharmacist 06/28/2022  12:33 PM

## 2022-06-28 NOTE — Progress Notes (Signed)
[facility] Infectious Diseases Follow Up Note  Patient Identification: Patient Name: Tyler Williams MRN: [id number] Admit Date: 06/23/2022  4:33 PM Age: 89 y.o.Today's Date: 06/28/2022  Reason for Visit: Bacteremia   Principal Problem:   Right arm cellulitis Active Problems:   HYPERCHOLESTEROLEMIA  IIA   Essential hypertension   Permanent atrial fibrillation (HCC)   Macular degeneration   Anxiety disorder   Constipation   CKD stage 3a, GFR 45-59 ml/min (HCC)   BPH (benign prostatic hyperplasia)   Chronic systolic CHF (congestive heart failure) (HCC)   Olecranon bursitis of right elbow   DNR (do not resuscitate)/DNI(Do Not Intubate)   Streptococcal bacteremia   Pressure injury of skin   Antibiotics: Vancomycin 5/10 Ceftriaxone 5/10 Cefazolin 5/11-c Linezolid 5/13-c  Lines/Hardwares:  TAVR, left knee replacement, partial rt knee replacement  Interval Events: remains afebrile Repeat blood cx 5/12 NG in 3 days  MRI rt elbow and forearm 5/14 findings reviewed   Assessment 85 Y O male with PMH as below including CHFrEF, CKD, HTN, severe aortic stenosis s/p TAVR  who presented from ALF 2/2 fall. Admitted with   # Group A strep bacteremia 2/2 Repeat blood cx 5/12 NG in 3 days  5/12 TTE with no vegetations   # Fall leading to rt forearm cellulitis and olecranon bursitis/abscess # Possible early osteomyelitis of olecranon process/proximal ulna   - conservative management per Orthopedics.  # Bilateral venous stasis/LE swelling  # Liver cirrhosis  Recommendations Continue cefazolin  Complete 72 hrs of linezolid for anti toxin effect I have reached out to Hand surgery  to evaluate in person for possible rt elbow abscess  Monitor CBC and BMP on abtx  Rest of the management as per the primary team. Thank you for the consult. Please page with pertinent questions or  concerns.  ______________________________________________________________________ Subjective patient seen and examined at the bedside. Soreness in the rt elbow is same.   Vitals BP (!) 127/57 (BP Location: Left Arm)   Pulse (!) 55   Temp 97.6 F (36.4 C) (Oral)   Resp 13   Ht 5\' 8"  (1.727 m)   Wt 60.5 kg   SpO2 99%   BMI 20.28 kg/m     Physical Exam Constitutional:  elderly male sitting in the  bed, eating lunch     Comments:   Cardiovascular:     Rate and Rhythm: Normal rate and Irregular rhythm.     Heart sounds:  Pulmonary:     Effort: Pulmonary effort is normal.     Comments:   Abdominal:     Palpations:     Tenderness: non distended and non tender  Musculoskeletal:        General: rt elbow is swollen, warm and tender, has some flexion and extension at rt elbow. RT forearm with cellulitis/no crepitus or fluctuance. No signs of septic joint including other peripheral joints as well as bilateral prosthetic knees.   Skin:    Comments: No rashes, Bilateral chronic venous stasis/bilateral leg swelling   Neurological:     General: awake, alert and oriented, grossly non focal, following commands   Psychiatric:        Mood and Affect: Mood normal.   Pertinent Microbiology Results for orders placed or performed during the hospital encounter of 06/23/22  Culture, blood (Routine X 2) w Reflex to ID Panel     Status: Abnormal   Collection Time: 06/23/22  5:34 PM   Specimen: BLOOD  Result Value Ref Range Status  Specimen Description   Final    BLOOD RIGHT ANTECUBITAL Performed at [facility], [address] [place], [place] [address]    Special Requests   Final    BOTTLES DRAWN AEROBIC AND ANAEROBIC Blood Culture results may not be optimal due to an inadequate volume of blood received in culture bottles Performed at [facility], [address] [place], [place] [address]    Culture  Setup Time   Final    GRAM POSITIVE  COCCI IN CHAINS IN BOTH AEROBIC AND ANAEROBIC BOTTLES CRITICAL RESULT CALLED TO, READ BACK BY AND VERIFIED WITH: PHARMD CRYSTAL R. [id number] FCP    Culture (A)  Final    GROUP A STREP (S.PYOGENES) ISOLATED HEALTH DEPARTMENT NOTIFIED Performed at [facility], [address] [place], [place] [address]    Report Status 06/26/2022 FINAL  Final   Organism ID, Bacteria GROUP A STREP (S.PYOGENES) ISOLATED  Final      Susceptibility   Group a strep (s.pyogenes) isolated - MIC*    PENICILLIN <=0.06 SENSITIVE Sensitive     CEFTRIAXONE <=0.12 SENSITIVE Sensitive     ERYTHROMYCIN >=8 RESISTANT Resistant     LEVOFLOXACIN 0.5 SENSITIVE Sensitive     VANCOMYCIN 0.25 SENSITIVE Sensitive     * GROUP A STREP (S.PYOGENES) ISOLATED  Culture, blood (Routine X 2) w Reflex to ID Panel     Status: Abnormal   Collection Time: 06/23/22  5:34 PM   Specimen: BLOOD LEFT FOREARM  Result Value Ref Range Status   Specimen Description   Final    BLOOD LEFT FOREARM Performed at [facility], [address], [place], [place] [address]    Special Requests   Final    BOTTLES DRAWN AEROBIC AND ANAEROBIC Blood Culture adequate volume Performed at [facility], [address], [place], [place] [address]    Culture  Setup Time   Final    GRAM POSITIVE COCCI IN CHAINS IN BOTH AEROBIC AND ANAEROBIC BOTTLES    Culture (A)  Final    GROUP A STREP (S.PYOGENES) ISOLATED SUSCEPTIBILITIES PERFORMED ON PREVIOUS CULTURE WITHIN THE LAST 5 DAYS. HEALTH DEPARTMENT NOTIFIED Performed at [facility], [address] [place], [place] [address]    Report Status 06/26/2022 FINAL  Final  Blood Culture ID Panel (Reflexed)     Status: Abnormal   Collection Time: 06/23/22  5:34 PM  Result Value Ref Range Status   Enterococcus faecalis NOT DETECTED NOT DETECTED Final   Enterococcus Faecium NOT DETECTED NOT DETECTED Final   Listeria monocytogenes NOT DETECTED NOT DETECTED Final    Staphylococcus species NOT DETECTED NOT DETECTED Final   Staphylococcus aureus (BCID) NOT DETECTED NOT DETECTED Final   Staphylococcus epidermidis NOT DETECTED NOT DETECTED Final   Staphylococcus lugdunensis NOT DETECTED NOT DETECTED Final   Streptococcus species DETECTED (A) NOT DETECTED Final    Comment: CRITICAL RESULT CALLED TO, READ BACK BY AND VERIFIED WITH: PHARMD CRYSTAL R. [id number] FCP    Streptococcus agalactiae NOT DETECTED NOT DETECTED Final   Streptococcus pneumoniae NOT DETECTED NOT DETECTED Final   Streptococcus pyogenes DETECTED (A) NOT DETECTED Final    Comment: CRITICAL RESULT CALLED TO, READ BACK BY AND VERIFIED WITH: PHARMD CRYSTAL R. [id number] FCP    A.calcoaceticus-baumannii NOT DETECTED NOT DETECTED Final   Bacteroides fragilis NOT DETECTED NOT DETECTED Final   Enterobacterales NOT DETECTED NOT DETECTED Final   Enterobacter cloacae complex NOT DETECTED NOT  DETECTED Final   Escherichia coli NOT DETECTED NOT DETECTED Final   Klebsiella aerogenes NOT DETECTED NOT DETECTED Final   Klebsiella oxytoca NOT DETECTED NOT DETECTED Final   Klebsiella pneumoniae NOT DETECTED NOT DETECTED Final   Proteus species NOT DETECTED NOT DETECTED Final   Salmonella species NOT DETECTED NOT DETECTED Final   Serratia marcescens NOT DETECTED NOT DETECTED Final   Haemophilus influenzae NOT DETECTED NOT DETECTED Final   Neisseria meningitidis NOT DETECTED NOT DETECTED Final   Pseudomonas aeruginosa NOT DETECTED NOT DETECTED Final   Stenotrophomonas maltophilia NOT DETECTED NOT DETECTED Final   Candida albicans NOT DETECTED NOT DETECTED Final   Candida auris NOT DETECTED NOT DETECTED Final   Candida glabrata NOT DETECTED NOT DETECTED Final   Candida krusei NOT DETECTED NOT DETECTED Final   Candida parapsilosis NOT DETECTED NOT DETECTED Final   Candida tropicalis NOT DETECTED NOT DETECTED Final   Cryptococcus neoformans/gattii NOT DETECTED NOT DETECTED Final    Comment: Performed  at [facility], [address], [place], [place] [address]  MRSA Next Gen by PCR, Nasal     Status: Abnormal   Collection Time: 06/24/22 12:22 PM   Specimen: Nasal Mucosa; Nasal Swab  Result Value Ref Range Status   MRSA by PCR Next Gen DETECTED (A) NOT DETECTED Final    Comment: (NOTE) The GeneXpert MRSA Assay (FDA approved for NASAL specimens only), is one component of a comprehensive MRSA colonization surveillance program. It is not intended to diagnose MRSA infection nor to guide or monitor treatment for MRSA infections. Test performance is not FDA approved in patients less than 61 years old. Performed at [facility], [address] [place], [place] [address]   Culture, blood (Routine X 2) w Reflex to ID Panel     Status: None (Preliminary result)   Collection Time: 06/25/22 10:13 AM   Specimen: BLOOD LEFT ARM  Result Value Ref Range Status   Specimen Description   Final    BLOOD LEFT ARM Performed at [facility], [address] [place], [place] [address]    Special Requests   Final    BOTTLES DRAWN AEROBIC AND ANAEROBIC Blood Culture adequate volume Performed at [facility], [address], [place], [place] [address]    Culture   Final    NO GROWTH 3 DAYS Performed at [facility], [address] [place], [place] [address]    Report Status PENDING  Incomplete  Culture, blood (Routine X 2) w Reflex to ID Panel     Status: None (Preliminary result)   Collection Time: 06/25/22 10:18 AM   Specimen: BLOOD LEFT HAND  Result Value Ref Range Status   Specimen Description   Final    BLOOD LEFT HAND Performed at [facility], [address] [place], [place] [address]    Special Requests   Final    AEROBIC BOTTLE ONLY Blood Culture adequate volume Performed at [facility], [address], [place], [place] [address]    Culture   Final    NO GROWTH 3 DAYS Performed  at [facility], [address], [place], [place] [address]    Report Status PENDING  Incomplete    Pertinent Lab.    Latest Ref Rng & Units 06/28/2022    4:10 AM 06/27/2022    4:25 AM 06/26/2022    3:57 AM  CBC  WBC 4.0 - 10.5 K/uL 7.4  8.7  12.0   Hemoglobin 13.0 - 17.0 g/dL 16.1  09.6  04.5   Hematocrit 39.0 - 52.0 % 32.9  32.0  33.3   Platelets 150 - 400 K/uL 172  175  172       Latest Ref Rng & Units 06/28/2022    4:10 AM 06/27/2022    4:25 AM 06/26/2022    3:57 AM  CMP  Glucose 70 - 99 mg/dL 91  409  93   BUN 8 - 23 mg/dL 40  42  42   Creatinine 0.61 - 1.24 mg/dL 8.11  9.14  7.82   Sodium 135 - 145 mmol/L 133  133  133   Potassium 3.5 - 5.1 mmol/L 4.6  4.5  4.3   Chloride 98 - 111 mmol/L 99  102  102   CO2 22 - 32 mmol/L 26  23  21    Calcium 8.9 - 10.3 mg/dL 8.3  8.2  8.1      Pertinent Imaging today Plain films and CT images have been personally visualized and interpreted; radiology reports have been reviewed. Decision making incorporated into the Impression  MR ELBOW RIGHT W WO CONTRAST  Result Date: 06/27/2022 CLINICAL DATA:  Joint effusion.  Elbow cellulitis/bursitis. EXAM: MRI OF THE RIGHT ELBOW WITHOUT AND WITH CONTRAST TECHNIQUE: Multiplanar, multisequence MR imaging of the elbow was performed before and after the administration of intravenous contrast. CONTRAST:  6mL GADAVIST GADOBUTROL 1 MMOL/ML IV SOLN COMPARISON:  None Available. FINDINGS: TENDONS Common forearm flexor origin: Intact Common forearm extensor origin: Intact Biceps: Intact Triceps: Intact LIGAMENTS Medial stabilizers: Intact Lateral stabilizers:  Intact Cartilage: No chondral defect. Joint: Small elbow joint effusion. Cubital tunnel: Normal. Bones: No fracture or dislocation. Mildly increased signal of the proximal ulna, which is likely reactive secondary to adjacent inflammatory process. Soft Tissues: There is a peripherally enhancing fluid collection about the posterior aspect of the elbow joint  which measures at least 1.8 x 3.2 x 5.5 cm. This may represent olecranon bursitis, however due to skin irregularity and surrounding subcutaneous soft tissue edema an abscess can not be excluded, clinical correlation is suggested. IMPRESSION: 1. There is a peripherally enhancing fluid collection about the posterior aspect of the elbow joint which measures at least 1.8 x 3.2 x 5.5 cm. This may represent olecranon bursitis, however due to skin irregularity and surrounding subcutaneous soft tissue edema an abscess can not be excluded, clinical correlation is suggested. 2. Small elbow joint effusion. 3. Mildly increased signal of the proximal ulna, which could be reactive secondary to adjacent bursitis/inflammatory changes, however if there is adjacent skin wound, early osteomyelitis can not be excluded. Electronically Signed   By: Larose Hires D.O.   On: 06/27/2022 23:54   MR FOREARM RIGHT W WO CONTRAST  Result Date: 06/27/2022 CLINICAL DATA:  Osteomyelitis suspected.  Open wound. EXAM: MRI OF THE RIGHT FOREARM WITHOUT AND WITH CONTRAST TECHNIQUE: Multiplanar, multisequence MR imaging of the right forearm was performed before and after the administration of intravenous contrast. CONTRAST:  6mL GADAVIST GADOBUTROL 1 MMOL/ML IV SOLN COMPARISON:  None Available. FINDINGS: Bones/Joint/Cartilage There is mild edema about the olecranon process and proximal ulna with enhancement on post contrast sequences concerning for early osteomyelitis. Marrow signal within the radius and distal ulna is within normal limits. There is small elbow joint effusion. Ligaments Intact. Muscles and Tendons There is generalized muscle atrophy of the flexor and extensor muscles of the forearm. No evidence of intramuscular fluid collection or abscess. Tendons of  the flexor and extensor compartment appear intact. Soft tissues Skin thickening and subcutaneous soft tissue edema about the posterior aspect of the proximal forearm. There is enhancement  of the soft tissues with a small partially imaged fluid collection concerning for small abscess/fluid collection. IMPRESSION: 1. Mild edema about the olecranon process and proximal ulna with enhancement on post contrast sequences concerning for early osteomyelitis. 2. Skin thickening and subcutaneous soft tissue edema about the posterior aspect of the proximal forearm with small partially imaged fluid collection concerning for small abscess/fluid collection. 3. Small elbow joint effusion. Electronically Signed   By: Larose Hires D.O.   On: 06/27/2022 23:40   ECHOCARDIOGRAM COMPLETE  Result Date: 06/25/2022    ECHOCARDIOGRAM REPORT   Patient Name:   Tyler Williams Date of Exam: 06/25/2022 Medical Rec #:  [id number]             Height: Accession #:    [id number]            Weight: Date of Birth:  1927-01-30              BSA: Patient Age:    89 years              BP:           118/59 mmHg Patient Gender: M                     HR:           69 bpm. Exam Location:  Inpatient Procedure: 2D Echo, Cardiac Doppler and Color Doppler Indications:    Bacteremia  History:        Patient has prior history of Echocardiogram examinations, most                 recent 12/23/2021. CHF, Arrythmias:Atrial Fibrillation; Risk                 Factors:Hypertension.                 Aortic Valve: valve is present in the aortic position.  Sonographer:    Lucy Antigua Referring Phys: [id number] JENNIFER YATES IMPRESSIONS  1. Left ventricular ejection fraction, by estimation, is 45 to 50%. The left ventricle has mildly decreased function. The left ventricle demonstrates global hypokinesis. Left ventricular diastolic parameters are indeterminate.  2. Right ventricular systolic function is moderately reduced. The right ventricular size is moderately enlarged. There is mildly elevated pulmonary artery systolic pressure.  3. Left atrial size was severely dilated.  4. Right atrial size was severely dilated.  5. Large pleural effusion.  6. Mild  mitral valve regurgitation.  7. S/p 29 mm Sapien prosthetic (TAVR) valve. 06/13/2016. V max 1.5 m/s. mean gradient 5 mmhg. well seated. Normal prothesis. Aortic valve regurgitation is not visualized. There is a valve present in the aortic position.  8. The inferior vena cava is dilated in size with <50% respiratory variability, suggesting right atrial pressure of 15 mmHg. Comparison(s): No significant change from prior study. FINDINGS  Left Ventricle: Left ventricular ejection fraction, by estimation, is 45 to 50%. The left ventricle has mildly decreased function. The left ventricle demonstrates global hypokinesis. The left ventricular internal cavity size was normal in size. There is  no left ventricular hypertrophy. Left ventricular diastolic parameters are indeterminate. Right Ventricle: The right ventricular size is moderately enlarged. Right ventricular systolic function is moderately reduced. There is mildly elevated pulmonary artery systolic pressure. The tricuspid regurgitant velocity is 2.71  m/s, and with an assumed right atrial pressure of 15 mmHg, the estimated right ventricular systolic pressure is 44.4 mmHg. Left Atrium: Left atrial size was severely dilated. Right Atrium: Right atrial size was severely dilated. Pericardium: Trivial pericardial effusion is present. Mitral Valve: Mild mitral valve regurgitation. Tricuspid Valve: Tricuspid valve regurgitation is mild. Aortic Valve: S/p 29 mm Sapien prosthetic (TAVR) valve. 06/13/2016. V max 1.5 m/s. mean gradient 5 mmhg. well seated. Normal prothesis. Aortic valve regurgitation is not visualized. Aortic valve mean gradient measures 5.0 mmHg. Aortic valve peak gradient measures 9.9 mmHg. Aortic valve area, by VTI measures 2.04 cm. There is a valve present in the aortic position. Pulmonic Valve: Pulmonic valve regurgitation is not visualized. Venous: The inferior vena cava is dilated in size with less than 50% respiratory variability, suggesting right atrial  pressure of 15 mmHg. IAS/Shunts: No atrial level shunt detected by color flow Doppler. Additional Comments: There is a large pleural effusion.  LEFT VENTRICLE PLAX 2D LVIDd:         5.10 cm     Diastology LVIDs:         3.60 cm     LV e' medial:    3.81 cm/s LV PW:         1.10 cm     LV E/e' medial:  39.4 LV IVS:        0.90 cm     LV e' lateral:   5.98 cm/s LVOT diam:     2.10 cm     LV E/e' lateral: 25.1 LV SV:         69 LVOT Area:     3.46 cm  LV Volumes (MOD) LV vol d, MOD A4C: 91.2 ml LV vol s, MOD A4C: 44.0 ml LV SV MOD A4C:     91.2 ml RIGHT VENTRICLE            IVC RV S prime:     6.53 cm/s  IVC diam: 3.00 cm TAPSE (M-mode): 1.1 cm LEFT ATRIUM              RIGHT ATRIUM LA Vol (A2C):   169.0 ml RA Area:     25.70 cm LA Vol (A4C):   137.0 ml RA Volume:   78.40 ml LA Biplane Vol: 154.0 ml  AORTIC VALVE AV Area (Vmax):    2.16 cm AV Area (Vmean):   1.86 cm AV Area (VTI):     2.04 cm AV Vmax:           157.00 cm/s AV Vmean:          110.000 cm/s AV VTI:            0.338 m AV Peak Grad:      9.9 mmHg AV Mean Grad:      5.0 mmHg LVOT Vmax:         97.90 cm/s LVOT Vmean:        59.100 cm/s LVOT VTI:          0.199 m LVOT/AV VTI ratio: 0.59  AORTA Ao Root diam: 2.60 cm MITRAL VALVE                  TRICUSPID VALVE MV Area (PHT): 4.57 cm       TR Peak grad:   29.4 mmHg MV Decel Time: 166 msec       TR Vmax:        271.00 cm/s MR Peak grad:    104.0 mmHg  MR Mean grad:    71.0 mmHg    SHUNTS MR Vmax:         510.00 cm/s  Systemic VTI:  0.20 m MR Vmean:        399.0 cm/s   Systemic Diam: 2.10 cm MR PISA:         1.57 cm MR PISA Eff ROA: 11 mm MR PISA Radius:  0.50 cm MV E velocity: 150.00 cm/s MV A velocity: 46.70 cm/s MV E/A ratio:  3.21 Photographer signed by Carolan Clines Signature Date/Time: 06/25/2022/1:06:35 PM    Final    CT FOREARM RIGHT W CONTRAST  Result Date: 06/23/2022 CLINICAL DATA:  Soft tissue mass, deep wrist with redness up to mid humerus. Four day history of right arm pain after  fall with laceration to skin. EXAM: CT OF THE UPPER RIGHT EXTREMITY WITH CONTRAST TECHNIQUE: Multidetector CT imaging of the upper right extremity was performed according to the standard protocol following intravenous contrast administration. RADIATION DOSE REDUCTION: This exam was performed according to the departmental dose-optimization program which includes automated exposure control, adjustment of the mA and/or kV according to patient size and/or use of iterative reconstruction technique. CONTRAST:  OMNIPAQUE IOHEXOL 300 MG/ML  SOLN COMPARISON:  None Available. FINDINGS: Bones/Joint/Cartilage Osteopenia is noted. No acute fracture or dislocation is seen. Degenerative changes are present at the elbow and wrist. There is no joint effusion at the elbow Ligaments Suboptimally assessed by CT. Muscles and Tendons Within normal limits. Soft tissues Vascular calcifications are noted in the soft tissues. There is diffuse subcutaneous edema in the right forearm. There is a circumscribed hypodense collection posterior to the olecranon measuring 4.7 x 1.8 cm, suggesting distended bursa. No rim enhancing fluid collection or mass is seen. No subcutaneous emphysema. IMPRESSION: 1. No acute fracture or dislocation. 2. Extensive subcutaneous edema.  No abscess is seen. 3. Distention of the olecranon bursa, suggesting bursitis. No mass is identified. 4. Degenerative changes at the wrist and elbow. Electronically Signed   By: Thornell Sartorius M.D.   On: 06/23/2022 21:33   DG Forearm Right  Result Date: 06/23/2022 CLINICAL DATA:  Four day history of right arm pain after fall and laceration to the skin EXAM: RIGHT FOREARM - 2 VIEW; RIGHT WRIST - COMPLETE 4 VIEW COMPARISON:  None Available. FINDINGS: There is no evidence of fracture or other focal bone lesions. Soft tissues are unremarkable. Diffuse degenerative changes of the partially imaged hand and elbow. IMPRESSION: No acute displaced fracture or dislocation. No  radiopaque foreign bodies. Electronically Signed   By: Agustin Cree M.D.   On: 06/23/2022 17:54   DG Wrist Complete Right  Result Date: 06/23/2022 CLINICAL DATA:  Four day history of right arm pain after fall and laceration to the skin EXAM: RIGHT FOREARM - 2 VIEW; RIGHT WRIST - COMPLETE 4 VIEW COMPARISON:  None Available. FINDINGS: There is no evidence of fracture or other focal bone lesions. Soft tissues are unremarkable. Diffuse degenerative changes of the partially imaged hand and elbow. IMPRESSION: No acute displaced fracture or dislocation. No radiopaque foreign bodies. Electronically Signed   By: Agustin Cree M.D.   On: 06/23/2022 17:54     I have personally spent 38  minute involved in face-to-face and non-face-to-face activities for this patient on the day of the visit. Professional time spent includes the following activities: Preparing to see the patient (review of tests), Obtaining and/or reviewing separately obtained history (admission/discharge record), Performing a medically appropriate examination and/or evaluation ,  Ordering medications/tests/procedures, referring and communicating with other health care professionals, Documenting clinical information in the EMR, Independently interpreting results (not separately reported), Communicating results to the patient/family/caregiver, Counseling and educating the patient/family/caregiver and Care coordination (not separately reported).   Plan d/w requesting provider as well as ID pharm D  Note: This document was prepared using dragon voice recognition software and may include unintentional dictation errors.   Electronically signed by:   Odette Fraction, MD Infectious Disease Physician [facility] Infectious [facility] Pager: [phone]

## 2022-06-28 NOTE — Progress Notes (Addendum)
I was notified by Dr. Elinor Parkinson of new imaging to RUE.  I discussed with Dr. Melvyn Novas who reviewed the MRIs and made recommendations as follows:  - No indications for surgery at this point - Posterior arm splint per OT for comfort - Continue wound care as previously outlined - Continue abx per ID/primary team  Please contact Dr. Melvyn Novas for further questions or concerns.  The patient was seen and examined today.  I agree with the above assessment.  Do not see any indications for surgical intervention at the current time.  OT was present for a long-arm splint or a posterior elbow splint to avoid any type of pressure on the posterior elbow.  Continue with the IV antibiotics as per ID.  Patient does have the cellulitic process.  Do not show any evidence of a deep space infection.  Will continue to treat this with the immobilization and IV therapy.  Please contact me should any issues or concerns arise. Bradly Bienenstock MD   Rosalene Billings, PA-C Orthopedic Surgery [facility] [place] [phone]

## 2022-06-28 NOTE — Evaluation (Addendum)
Occupational Therapy Evaluation Patient Details Name: Tyler Williams MRN: [id number] DOB: 10/20/1926 Today's Date: 06/28/2022   History of Present Illness Patient is a 89 year old male who presented from ALF after a fall with R arm skin tear with fever and worsening erythema. Patient was admitted with olecranon bursitis of R elbow, streptococcal bacteremia. PMH: macular degeneration, anxiety,   Clinical Impression   Patient is a 89 year old male who was admitted for above. Patient reported living at ALF with rollator, patient reported having help with medication and bathing/dressing sometimes. Patient currently has WB restrictions on RUE that patient is at times frustrated with implementation during session. Splint specialist OT contacted on this date and plan to splint patient on 5/16.  Patient was mod A for bed mobility and transfers with max multimodal cues to follow ROM and WB restrictions. Patient was noted to have decreased functional activity tolerance, decreased endurance, decreased standing balance, increased pain in RUE, decreased ROM and increased WB restrictions, decreased safety awareness, and decreased knowledge of AD/AE impacting participation in ADLs. Patient would continue to benefit from skilled OT services at this time while admitted and after d/c to address noted deficits in order to improve overall safety and independence in ADLs.       Recommendations for follow up therapy are one component of a multi-disciplinary discharge planning process, led by the attending physician.  Recommendations may be updated based on patient status, additional functional criteria and insurance authorization.   Assistance Recommended at Discharge Frequent or constant Supervision/Assistance  Patient can return home with the following A little help with walking and/or transfers;A little help with bathing/dressing/bathroom;Assistance with cooking/housework;Direct supervision/assist for medications  management;Assist for transportation;Help with stairs or ramp for entrance;Direct supervision/assist for financial management    Functional Status Assessment  Patient has had a recent decline in their functional status and demonstrates the ability to make significant improvements in function in a reasonable and predictable amount of time.  Equipment Recommendations          Precautions / Restrictions Precautions Precautions: Fall Precaution Comments: limit extension of elbow. "splint for comfort" Restrictions Weight Bearing Restrictions: Yes Other Position/Activity Restrictions: WB through forearm only, none through elbow. platform walker.      Mobility Bed Mobility Overal bed mobility: Needs Assistance Bed Mobility: Supine to Sit     Supine to sit: Mod assist     General bed mobility comments: with increased time and cues to limit use of RUE        Balance Overall balance assessment: Mild deficits observed, not formally tested           ADL either performed or assessed with clinical judgement   ADL Overall ADL's : Needs assistance/impaired Eating/Feeding: Sitting;Set up   Grooming: Sitting;Set up   Upper Body Bathing: Minimal assistance;Sitting   Lower Body Bathing: Maximal assistance;Sit to/from stand;Sitting/lateral leans   Upper Body Dressing : Minimal assistance;Sitting   Lower Body Dressing: Total assistance;Sitting/lateral leans;Sit to/from stand   Toilet Transfer: Minimal assistance;Ambulation;Rolling walker (2 wheels) Toilet Transfer Details (indicate cue type and reason): patient noted to have sliding with attempting to stand with LLE. patient requesting shoes that are not present in room at this time. patient was able to use RW to take steps to recliner in room. patient was fitted with platform walker at end of session for return to trip to bed after MD change in recommendations. Toileting- Clothing Manipulation and Hygiene: Total assistance;Sit  to/from stand  Vision Baseline Vision/History: 1 Wears glasses Additional Comments: tinted glasses for macular degneration            Pertinent Vitals/Pain Pain Assessment Pain Assessment: Faces Faces Pain Scale: Hurts a little bit Pain Location: with any near touch to the RUE Pain Descriptors / Indicators: Grimacing, Discomfort, Guarding Pain Intervention(s): Limited activity within patient's tolerance, Monitored during session     Hand Dominance Right   Extremity/Trunk Assessment Upper Extremity Assessment Upper Extremity Assessment: RUE deficits/detail RUE Deficits / Details: WLF ROM hanf wrist, elbow and shouler, pain  with extension completed prior to MD entering room reporting restrictions.   Lower Extremity Assessment Lower Extremity Assessment: Defer to PT evaluation   Cervical / Trunk Assessment Cervical / Trunk Assessment: Kyphotic   Communication Communication Communication: No difficulties   Cognition Arousal/Alertness: Awake/alert Behavior During Therapy: WFL for tasks assessed/performed Overall Cognitive Status: Within Functional Limits for tasks assessed             General Comments: patient was plesant and motivated to get out of bed. patient was not receptive to new restrictions MD applied to movement of RUE     General Comments  RUE forearm with dressing over wound. patient noted to have bursitis and cellulitis of olecranon with large edemaous pace. patient has redness to distal upper arm with pain with touch. 12 inches from wrist to end of elbow  11 inches around elbow joint  9 3/4  inches upper arm  10 inches distal to elbow around             Home Living Family/patient expects to be discharged to:: Assisted living         Home Equipment: Rollator (4 wheels);Shower seat - built in   Additional Comments: lives in accessible environment with rollator to walk      Prior Functioning/Environment Prior Level of  Function : Needs assist       Physical Assist : Mobility (physical) Mobility (physical): Gait   Mobility Comments: use of rollator ADLs Comments: staff assist for ADLs, meals, medication mgmt        OT Problem List: Pain;Impaired UE functional use;Decreased knowledge of use of DME or AE;Decreased knowledge of precautions;Impaired balance (sitting and/or standing);Decreased activity tolerance      OT Treatment/Interventions: Self-care/ADL training;Energy conservation;Splinting;DME and/or AE instruction;Patient/family education;Therapeutic activities;Therapeutic exercise;Balance training    OT Goals(Current goals can be found in the care plan section) Acute Rehab OT Goals Patient Stated Goal: to go back to [facility] OT Goal Formulation: With patient Time For Goal Achievement: 07/12/22 Potential to Achieve Goals: Fair  OT Frequency: Min 2X/week       AM-PAC OT "6 Clicks" Daily Activity     Outcome Measure Help from another person eating meals?: A Little Help from another person taking care of personal grooming?: A Little Help from another person toileting, which includes using toliet, bedpan, or urinal?: A Lot Help from another person bathing (including washing, rinsing, drying)?: A Lot Help from another person to put on and taking off regular upper body clothing?: A Lot Help from another person to put on and taking off regular lower body clothing?: A Lot 6 Click Score: 14   End of Session Equipment Utilized During Treatment: Gait belt;Rolling walker (2 wheels);Other (comment) Location manager) Nurse Communication: Other (comment) (restrictions for RUE and splint specalist coming tomorrow)  Activity Tolerance: Patient limited by pain Patient left: in chair;with call bell/phone within reach;with chair alarm set  OT Visit Diagnosis: Unsteadiness on feet (R26.81);Other  abnormalities of gait and mobility (R26.89);Muscle weakness (generalized) (M62.81)                Time:  1610-9604 OT Time Calculation (min): 42 min Charges:  OT General Charges $OT Visit: 1 Visit OT Evaluation $OT Eval Moderate Complexity: 1 Mod OT Treatments $Self Care/Home Management : 23-37 mins  Tyler Williams OTR/L, MS Acute Rehabilitation Department Office# [phone]   Selinda Flavin 06/28/2022, 4:21 PM

## 2022-06-29 DIAGNOSIS — L03113 Cellulitis of right upper limb: Secondary | ICD-10-CM | POA: Diagnosis not present

## 2022-06-29 LAB — CBC
HCT: 31.5 % — ABNORMAL LOW (ref 39.0–52.0)
Hemoglobin: 10.1 g/dL — ABNORMAL LOW (ref 13.0–17.0)
MCH: 30.2 pg (ref 26.0–34.0)
MCHC: 32.1 g/dL (ref 30.0–36.0)
MCV: 94.3 fL (ref 80.0–100.0)
Platelets: 184 10*3/uL (ref 150–400)
RBC: 3.34 MIL/uL — ABNORMAL LOW (ref 4.22–5.81)
RDW: 15.8 % — ABNORMAL HIGH (ref 11.5–15.5)
WBC: 7.9 10*3/uL (ref 4.0–10.5)
nRBC: 0 % (ref 0.0–0.2)

## 2022-06-29 LAB — CULTURE, BLOOD (ROUTINE X 2)
Culture: NO GROWTH
Special Requests: ADEQUATE

## 2022-06-29 LAB — HEPATITIS C ANTIBODY: HCV Ab: NONREACTIVE — AB

## 2022-06-29 LAB — PROTIME-INR
INR: 2.3 — ABNORMAL HIGH (ref 0.8–1.2)
Prothrombin Time: 25.9 seconds — ABNORMAL HIGH (ref 11.4–15.2)

## 2022-06-29 LAB — HEPATITIS B SURFACE ANTIBODY, QUANTITATIVE: Hep B S AB Quant (Post): <3.5 m[IU]/mL — ABNORMAL LOW (ref >9.9)

## 2022-06-29 MED ORDER — WARFARIN SODIUM 2.5 MG PO TABS
2.5000 mg | ORAL_TABLET | Freq: Every day | ORAL | Status: DC
  Administered 2022-06-29 – 2022-06-30 (×2): 2.5 mg via ORAL
  Filled 2022-06-29 (×2): qty 1

## 2022-06-29 NOTE — Progress Notes (Addendum)
PHARMACY CONSULT NOTE FOR:  OUTPATIENT  PARENTERAL ANTIBIOTIC THERAPY (OPAT)  Indication: GAS septic arthritis/osteo Regimen: Cefazolin 2g IV every 8 hours End date: 08/05/22  IV antibiotic discharge orders are pended. To discharging provider:  please sign these orders via discharge navigator,  Select New Orders & click on the button choice - Manage This Unsigned Work.    Thank you for allowing pharmacy to be a part of this patient's care.  Georgina Pillion, PharmD, BCPS Infectious Diseases Clinical Pharmacist 06/30/2022 12:20 PM   **Pharmacist phone directory can now be found on [email or web address] ([email or web address]).  Listed under [facility].

## 2022-06-29 NOTE — Progress Notes (Signed)
ANTICOAGULATION CONSULT NOTE - Follow Up Consult  Pharmacy Consult for warfarin Indication: hx atrial fibrillation  No Known Allergies  Patient Measurements: Height: 5\' 8"  (172.7 cm) Weight: 55.3 kg (121 lb 14.6 oz) IBW/kg (Calculated) : 68.4 Heparin Dosing Weight:   Vital Signs: Temp: 97.6 F (36.4 C) (05/16 0637) Temp Source: Oral (05/16 0637) BP: 125/57 (05/16 1401) Pulse Rate: 63 (05/16 1401)  Labs: Recent Labs    06/27/22 0425 06/28/22 0410 06/29/22 0407  HGB 10.0* 10.6* 10.1*  HCT 32.0* 32.9* 31.5*  PLT 175 172 184  LABPROT 25.9* 24.5* 25.9*  INR 2.3* 2.2* 2.3*  CREATININE 1.12 1.23  --     Estimated Creatinine Clearance: 28.7 mL/min (by C-G formula based on SCr of 1.23 mg/dL).   Medications:  - PTA warfarin regimen: 2.5 mg daily   Assessment: Patient is a 89 y.o M with hx afib on warfarin PTA who presented to the ED on 06/23/22 with c/o right wrist/forearm pain and swelling s/p fall and fever. Warfarin resumed on admission.   Today, 06/29/2022: - INR remains therapeutic at 2.3 - cbc stable - no bleeding documented - drug-drug intxns: being on abx (ancef) can make pt more sensitive to warfarin - eating 100% of meals  Goal of Therapy:  INR 2-3 Monitor platelets by anticoagulation protocol: Yes   Plan:  - warfarin 2.5 mg daily (PTA regimen) - daily INR - monitor for s/sx bleeding   Dorna Leitz P 06/29/2022,2:04 PM

## 2022-06-29 NOTE — Progress Notes (Signed)
Occupational Therapy Treatment Patient Details Name: Tyler Williams MRN: [id number] DOB: 10-11-1926 Today's Date: 06/29/2022   History of present illness Patient is a 89 year old male who presented from ALF after a fall with R arm skin tear with fever and worsening erythema. Patient was admitted with olecranon bursitis of R elbow, streptococcal bacteremia. PMH: macular degeneration, anxiety,   OT comments  Attempted to fabricate splint for R elbow however pt refused. He also refused any positioning or elbow pad to protect R elbow. "You're not putting anything on me". Educated pt on limiting use of RUE however pt not adhering to precautions. Educated pt to position arm on pillows without any pressure on his elbow and limit use of RUE - pt did not verbalize understanding. At this time required Mod A with bed mobility and mod A +2 to stand from elevated surface as he has a posterior bias and is at an extremely high risk for falls. Also refused to use a gait belt for safety. If pt has assistance with all mobility and ADL, he is appropriate to DC to ALF. If this is not available, Patient will benefit from continued inpatient follow up therapy, <3 hours/day. Acute OT to follow.    Recommendations for follow up therapy are one component of a multi-disciplinary discharge planning process, led by the attending physician.  Recommendations may be updated based on patient status, additional functional criteria and insurance authorization.    Assistance Recommended at Discharge Frequent or constant Supervision/Assistance  Patient can return home with the following  A lot of help with walking and/or transfers;A lot of help with bathing/dressing/bathroom;Assistance with cooking/housework;Direct supervision/assist for medications management;Direct supervision/assist for financial management;Assist for transportation;Help with stairs or ramp for entrance   Equipment Recommendations  BSC/3in1     Recommendations for Other Services      Precautions / Restrictions Precautions Precaution Comments: limit extension of elbow. "splint for comfort" Restrictions Weight Bearing Restrictions: Yes Other Position/Activity Restrictions: WB through forearm only, none through elbow. platform walker. Patient refused to use plarform RW       Mobility Bed Mobility Overal bed mobility: Needs Assistance Bed Mobility: Supine to Sit     Supine to sit: Mod assist, +2 for safety/equipment          Transfers Overall transfer level: Needs assistance Equipment used: Right platform walker Transfers: Sit to/from Stand Sit to Stand: Mod assist, +2 safety/equipment, From elevated surface           General transfer comment: unable to stand from regular bed level     Balance Overall balance assessment: History of Falls, Needs assistance (posterior bias)   Sitting balance-Leahy Scale: Fair       Standing balance-Leahy Scale: Poor                             ADL either performed or assessed with clinical judgement   ADL   Eating/Feeding: Set up       Upper Body Bathing: Minimal assistance   Lower Body Bathing: Moderate assistance;Sit to/from stand   Upper Body Dressing : Minimal assistance   Lower Body Dressing: Maximal assistance;Sit to/from stand                 General ADL Comments: Mod A for funcitonal mobility with attempts to use platform RW    Extremity/Trunk Assessment Upper Extremity Assessment Upper Extremity Assessment: RUE deficits/detail RUE Deficits / Details: erythema/edema  R elbow  Lower Extremity Assessment Lower Extremity Assessment: RLE deficits/detail;LLE deficits/detail RLE Deficits / Details: dressings on lower legs, dark purple LLE Deficits / Details: same as right   Cervical / Trunk Assessment Cervical / Trunk Assessment: Kyphotic    Vision       Perception     Praxis      Cognition Arousal/Alertness:  Awake/alert Behavior During Therapy: Impulsive, Agitated Overall Cognitive Status: No family/caregiver present to determine baseline cognitive functioning                                 General Comments: impaired safety, judgement, awareness, reasoning        Exercises      Shoulder Instructions       General Comments      Pertinent Vitals/ Pain       Pain Assessment Pain Assessment: Faces Faces Pain Scale: Hurts little more Pain Location: R UE; L LE Pain Descriptors / Indicators: Discomfort Pain Intervention(s): Limited activity within patient's tolerance  Home Living Family/patient expects to be discharged to:: Assisted living; TBD                             Home Equipment: Rollator (4 wheels);Shower seat - built in   Additional Comments: lives in accessible environment with rollator to walk      Prior Functioning/Environment              Frequency  Min 2X/week        Progress Toward Goals  OT Goals(current goals can now be found in the care plan section)  Progress towards OT goals: Progressing toward goals  Acute Rehab OT Goals Patient Stated Goal: To not be sold anything OT Goal Formulation: Patient unable to participate in goal setting Time For Goal Achievement: 07/12/22 Potential to Achieve Goals: Fair ADL Goals Pt Will Perform Grooming: with supervision;sitting Pt Will Perform Lower Body Dressing: with supervision;sit to/from stand;sitting/lateral leans Pt Will Transfer to Toilet: with supervision;ambulating;regular height toilet  Plan Discharge plan needs to be updated    Co-evaluation    PT/OT/SLP Co-Evaluation/Treatment: Yes (partail session) Reason for Co-Treatment: For patient/therapist safety PT goals addressed during session: Mobility/safety with mobility OT goals addressed during session: ADL's and self-care      AM-PAC OT "6 Clicks" Daily Activity     Outcome Measure   Help from another person  eating meals?: A Little Help from another person taking care of personal grooming?: A Little Help from another person toileting, which includes using toliet, bedpan, or urinal?: A Lot Help from another person bathing (including washing, rinsing, drying)?: A Lot Help from another person to put on and taking off regular upper body clothing?: A Little Help from another person to put on and taking off regular lower body clothing?: A Lot 6 Click Score: 15    End of Session Equipment Utilized During Treatment: Other (comment) (platform RW although pt not using)  OT Visit Diagnosis: Unsteadiness on feet (R26.81);Other abnormalities of gait and mobility (R26.89);Muscle weakness (generalized) (M62.81)   Activity Tolerance Patient tolerated treatment well   Patient Left in chair;with call bell/phone within reach;with chair alarm set   Nurse Communication Mobility status;Other (comment) (DC needs)        Time: 1610-9604 OT Time Calculation (min): 33 min  Charges: OT General Charges $OT Visit: 1 Visit OT Treatments $Self Care/Home Management : 8-22 mins  Luisa Dago, OT/L   Acute OT Clinical Specialist Acute Rehabilitation Services Pager [phone] Office [phone]   [facility] 06/29/2022, 10:36 AM

## 2022-06-29 NOTE — TOC Progression Note (Signed)
Transition of Care [facility]) - Progression Note    Patient Details  Name: Emmert Nichter MRN: [id number] Date of Birth: March 04, 1926  [facility] CM/SW Contact  Otelia Santee, LCSW Phone Number: 06/29/2022, 2:04 PM  Clinical Narrative:    Pt recommended for SNF vs return to ALF w/ 24/7 caregivers. Spoke with pt's daughter who would prefer pt to return to [facility] as they have concerns with SNF's in the area. Ms. Lilian Kapur shares that if pt were to go to SNF [facility].  CSW provided private caregiver agency information to pt's daughter who plans to explore this option with her brother.  Referrals have been sent out to SNF as alternative disposition option.    Expected Discharge Plan: Assisted Living Barriers to Discharge: Continued Medical Work up  Expected Discharge Plan and Services In-house Referral: Clinical Social Work Discharge Planning Services: NA Post Acute Care Choice: Resumption of Svcs/PTA Provider Living arrangements for the past 2 months: Assisted Living Facility                 DME Arranged: N/A DME Agency: NA                   Social Determinants of Health (SDOH) Interventions SDOH Screenings   Food Insecurity: No Food Insecurity (06/24/2022)  Housing: Low Risk  (06/24/2022)  Transportation Needs: No Transportation Needs (06/24/2022)  Utilities: Not At Risk (06/24/2022)  Depression (PHQ2-9): Low Risk  (12/06/2018)  Tobacco Use: Low Risk  (06/24/2022)    Readmission Risk Interventions    06/29/2022    2:04 PM 06/26/2022    3:39 PM 12/24/2021    3:52 PM  Readmission Risk Prevention Plan  Transportation Screening Complete Complete Complete  PCP or Specialist Appt within 3-5 Days   Complete  HRI or Home Care Consult   Complete  Social Work Consult for Recovery Care Planning/Counseling   Complete  Palliative Care Screening   Not Applicable  Medication Review [employer]) Complete  Complete  PCP or Specialist appointment within 3-5  days of discharge Complete Complete   HRI or Home Care Consult Complete Complete   SW Recovery Care/Counseling Consult  Complete   Palliative Care Screening  Not Applicable   Skilled Nursing Facility  Not Applicable

## 2022-06-29 NOTE — Evaluation (Signed)
Physical Therapy Evaluation Patient Details Name: Tyler Williams MRN: [id number] DOB: [birth year] Today's Date: 06/29/2022  History of Present Illness  Patient is a 89 year old male who presented from ALF after a fall with R arm skin tear with fever and worsening erythema. Patient was admitted with olecranon bursitis of R elbow, streptococcal bacteremia. PMH: macular degeneration, anxiety,  Clinical Impression  Pt admitted with above diagnosis.  Pt currently with functional limitations due to the deficits listed below (see PT Problem List). Pt will benefit from acute skilled PT to increase their independence and safety with mobility to allow discharge.     The patient  required 2 persons to safely ambulate x 10'. Patient declining safety belt and declined to place RUE in platform cradle. Patient  assisted with  pushing RW forward.   Patient currently will require 24/7 assistance for safety, reminders of RUE precautions.     Recommendations for follow up therapy are one component of a multi-disciplinary discharge planning process, led by the attending physician.  Recommendations may be updated based on patient status, additional functional criteria and insurance authorization.  Follow Up Recommendations Can patient physically be transported by private vehicle: No     Assistance Recommended at Discharge Frequent or constant Supervision/Assistance  Patient can return home with the following  Two people to help with walking and/or transfers;A lot of help with bathing/dressing/bathroom;Assist for transportation    Equipment Recommendations  (platform RW)  Recommendations for Other Services       Functional Status Assessment Patient has had a recent decline in their functional status and demonstrates the ability to make significant improvements in function in a reasonable and predictable amount of time.     Precautions / Restrictions Precautions Precaution Comments: limit extension of  elbow. "splint for comfort" Restrictions Weight Bearing Restrictions: Yes Other Position/Activity Restrictions: WB through forearm only, none through elbow. platform walker. Patient refused to use plarform RW      Mobility  Bed Mobility               General bed mobility comments: sirtting on bed edge    Transfers Overall transfer level: Needs assistance Equipment used: Right platform walker Transfers: Sit to/from Stand Sit to Stand: Mod assist, +2 physical assistance, +2 safety/equipment           General transfer comment: multimodal cues and  encouragement to stand up, declining placing RUE on right platform and declined  safety belt.  Cues to reach back, decreased descent control    Ambulation/Gait Ambulation/Gait assistance: Mod assist, +2 safety/equipment Gait Distance (Feet): 10 Feet Assistive device: Right platform walker Gait Pattern/deviations: Step-to pattern, Decreased step length - right, Decreased step length - left, Shuffle, Trunk flexed Gait velocity: decr     General Gait Details: multimodal cues to initiate ambulation, gait shuffling, frequent cues  for target to get to recliner. 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    Modified Rankin (Stroke Patients Only)       Balance Overall balance assessment: History of Falls, Needs assistance Sitting-balance support: Feet supported, No upper extremity supported Sitting balance-Leahy Scale: Fair     Standing balance support: Bilateral upper extremity supported, During functional activity Standing balance-Leahy Scale: Poor  Pertinent Vitals/Pain Pain Assessment Pain Assessment: PAINAD Breathing: normal Negative Vocalization: none Facial Expression: smiling or inexpressive Body Language: relaxed Consolability: no need to console PAINAD Score: 0    Home Living Family/patient expects to be  discharged to:: Assisted living                 Home Equipment: Rollator (4 wheels);Shower seat - built in Additional Comments: lives in accessible environment with rollator to walk    Prior Function Prior Level of Function : Needs assist       Physical Assist : Mobility (physical) Mobility (physical): Gait   Mobility Comments: use of rollator ADLs Comments: staff assist for ADLs, meals, medication mgmt     Hand Dominance   Dominant Hand: Right    Extremity/Trunk Assessment   Upper Extremity Assessment Upper Extremity Assessment: RUE deficits/detail RUE Deficits / Details: erythema/edema  R elbow    Lower Extremity Assessment Lower Extremity Assessment: RLE deficits/detail;LLE deficits/detail RLE Deficits / Details: dressings on lower legs, dark purple LLE Deficits / Details: same as right    Cervical / Trunk Assessment Cervical / Trunk Assessment: Kyphotic  Communication   Communication: No difficulties  Cognition Arousal/Alertness: Awake/alert Behavior During Therapy: Agitated Overall Cognitive Status: Impaired/Different from baseline Area of Impairment: Attention, Following commands, Safety/judgement                   Current Attention Level: Focused   Following Commands: Follows one step commands inconsistently Safety/Judgement: Decreased awareness of safety, Decreased awareness of deficits     General Comments: patient irritable  , refused the safety belt, refused to place right arm in  platform cradle for ambulation.        General Comments      Exercises     Assessment/Plan    PT Assessment Patient needs continued PT services  PT Problem List Decreased strength;Decreased safety awareness;Decreased mobility;Decreased range of motion;Decreased knowledge of precautions;Decreased activity tolerance;Decreased cognition;Decreased balance;Decreased knowledge of use of DME       PT Treatment Interventions DME instruction;Therapeutic  activities;Cognitive remediation;Gait training;Therapeutic exercise;Patient/family education;Functional mobility training;Balance training    PT Goals (Current goals can be found in the Care Plan section)  Acute Rehab PT Goals Patient Stated Goal: pt unable PT Goal Formulation: Patient unable to participate in goal setting Time For Goal Achievement: 07/13/22 Potential to Achieve Goals: Fair    Frequency Min 1X/week     Co-evaluation PT/OT/SLP Co-Evaluation/Treatment: Yes Reason for Co-Treatment: For patient/therapist safety PT goals addressed during session: Mobility/safety with mobility OT goals addressed during session: ADL's and self-care       AM-PAC PT "6 Clicks" Mobility  Outcome Measure Help needed turning from your back to your side while in a flat bed without using bedrails?: A Little Help needed moving from lying on your back to sitting on the side of a flat bed without using bedrails?: A Little Help needed moving to and from a bed to a chair (including a wheelchair)?: A Lot Help needed standing up from a chair using your arms (e.g., wheelchair or bedside chair)?: A Lot Help needed to walk in hospital room?: Total Help needed climbing 3-5 steps with a railing? : Total 6 Click Score: 12    End of Session Equipment Utilized During Treatment: Gait belt Activity Tolerance: Patient tolerated treatment well Patient left: in chair;with call bell/phone within reach;with chair alarm set Nurse Communication: Mobility status PT Visit Diagnosis: Unsteadiness on feet (R26.81);Difficulty in walking, not elsewhere classified (R26.2)  Time: 1610-9604 PT Time Calculation (min) (ACUTE ONLY): 23 min   Charges:   PT Evaluation $PT Eval Low Complexity: 1 Low          Blanchard Kelch PT Acute Rehabilitation Services Office [phone] Weekend [phone]   Rada Hay 06/29/2022, 10:33 AM

## 2022-06-29 NOTE — Progress Notes (Signed)
PROGRESS NOTE    Tyler Williams  UJW:[id number] DOB: 01-Jun-1926 DOA: 06/23/2022 PCP: Tyler Paradise, MD    Brief Narrative:   Tyler Williams is a 89 y.o. male with past medical history significant for chronic systolic CHF, afib, h/o TAVR, HTN, and stage 3a CKD presenting from ALF with a fall. R arm skin tear with worsening erythema and fever. WBC 15.1. CT R forearm done. Diagnosed with RUE cellulitis, treated with Rocephin, Vanc. Wound care consulted. Noted to have olecranon bursitis of the R elbow; discussed with orthopedics (Dr. Aundria Williams) who recommended antibiotics and elevation, but no surgical intervention needed at this time. Infectious disease following and patient on Ancef.   Assessment & Plan:   Right arm cellulitis Patient with fall resulting in skin tear to R forearm now presenting with erythema and edema of the R lower arm and extending above the elbow.  TTE with no evidence of valvular vegetation.   MR right forearm with mild edema olecranon process and proximal ulnar with mild enhancement concerning for early osteomyelitis, skin thickening/subcu soft tissue edema posterior aspect proximal forearm with small partially imaged fluid collection concerning for small abscess, small elbow joint effusion.  Reevaluated by orthopedics, Dr. Orlan Williams on 5/15, no surgical intervention required at this time and to continue supportive care, IV antibiotics. -- WBC 15.1>20.5>16.0>12.0>8.7>7.4>7.9 -- s/p Linezolid x 72 hours per ID -- Cefazolin 2 g IV every 8 hours -- Wound care RUE, wash with skin cleanser, pat dry, cover with Xeroform gauze top with ABD pad and secure with Kerlix, change daily   Olecranon bursitis of right elbow EDP discussed case with Dr. Aundria Williams with orthopedics, who recommended antibiotics and elevation.  After positive blood cultures, formal consultation done.  Orthopedic on-call discussed with hand surgery on-call and added wound care orders as well for cellulitis  as above.  MR right elbow 5/14 with peripherally enhancing fluid collection posterior aspect elbow joint measuring 1.8 x 3.2 x 5.5 cm with peripherally enhancing fluid collection posterior aspect elbow joint measuring 1.8 x 3.2 x 5.5 cm consistent with olecranon bursitis however due to skin irregularity and surrounding subcu soft tissue edema abscess cannot be excluded, small elbow joint effusion, mildly increased signal proximal ulnar could be reactive versus early osteomyelitis cannot be excluded.  Reengagement with orthopedics 5/115, in which PA-C discussed with Dr. Orlan Williams who reviewed the MRIs with no indication for surgery at this point with recommendation of a posterior arm splint for comfort, wound care and IV antibiotics per ID.  Streptococcal bacteremia Blood cultures x 2 5/10 positive for strep pyogenes.  Transthoracic echocardiogram unremarkable for venular vegetation.  Completed 72-hour course of linezolid per ID. -- ID following, appreciate assistance -- Repeat blood culture 5/12: No growth x 4 days -- ID currently deferring TEE at this time -- Cefazolin 2 g IV every 8 hours   Acute on chronic systolic CHF (congestive heart failure) (HCC) Repeat echo during this hospitalization she notes improvement from previous echo 6 months ago.  Now with ejection fraction of 45 to 50% with global hypokinesis and indeterminate diastolic function. BNP elevated on 5/12 at 1136.  Patient started on IV Lasix.  He is normally on 60 mg p.o. daily as needed.  Have given IV albumin x 2 doses. -- Continue Imdur, Toprol XL, Entresto, spironolactone -- Transition IV furosemide to 60 mg p.o. daily today -- Strict I's and O's and daily weights -- Repeat BMP/BNP in a.m.   CKD stage 3a, GFR 45-59 ml/min (HCC) --  stable at this time, Cr 1.23 -- avoid nephrotoxic medications -- BMP in am    Permanent atrial fibrillation  Supratherapeutic INR: resolved INR supratherapeutic on admission at 4.1, likely secondary  to acute CHF causing hepatic congestion causing decreased Coumadin clearance.  INR level now within normal limits, Coumadin resumed. -- Rate controlled with metoprolol succinate 12.5 mg p.o. daily -- Continue Coumadin, dosing per pharmacy; INR 2.3 today   Essential hypertension -- Metoprolol succinate 12.5 mg p.o. daily -- Entresto 24-26 mg p.o. twice daily -- Spironolactone 25 mg p.o. daily   Anxiety disorder -- Continue Ativan   BPH (benign prostatic hyperplasia) -- Continue Toviaz, finasteride, tamsulosin   Pressure injury of skin R forearm skin tear Pressure Injury 06/24/22 Buttocks Right;Left Stage 1 -  Intact skin with non-blanchable redness of a localized area usually over a bony prominence. redness (Active)  06/24/22 0354  Location: Buttocks  Location Orientation: Right;Left  Staging: Stage 1 -  Intact skin with non-blanchable redness of a localized area usually over a bony prominence.  Wound Description (Comments): redness  Present on Admission: Yes  -- Seen by wound RN -- Wound care to bilateral lower extremities, wash and pat gently dry, cover open areas with full layers of Xeroform gauze top with dry gauze, secure with Kerlix applied from toes to knee with paper tape, Prevalon boots -- Wound care RUE, wash with skin cleanser, pat dry, cover with Xeroform gauze top with ABD pad and secure with Kerlix, change daily  Constipation -- Continue home lactulose, Miralax   Macular degeneration Significantly visually impaired --Supportive care   Dyslipidemia -- Continue atorvastatin 20 mg p.o. daily   Overweight: Meets criteria with BMI greater than 30  DNR (do not resuscitate)/DNI(Do Not Intubate) -- Verified DNR/DNI status with patient/son.  -- Pt will need yellow DNR form completed prior to discharge.    DVT prophylaxis: SCDs Start: 06/24/22 0118    Code Status: DNR Family Communication: No family present at bedside this morning, updated patient's daughter Tyler Williams  via telephone this afternoon  Disposition Plan:  Level of care: Med-Surg Status is: Inpatient Remains inpatient appropriate because: IV antibiotics, from [facility], ALF    Consultants:  Orthopedics, Dr. Orlan Williams Infectious disease  Procedures:  TTE  Antimicrobials:  Vancomycin 5/10 - 5/10 Ceftriaxone 5/10 - 5/10 Cefazolin 5/11>> Linezolid 5/13>>   Subjective: Patient seen and examined at bedside, resting comfortably.  Lying in bed.  No family present.  Today denies any tenderness to posterior aspect of his right elbow but does have some mild pain on palpation of right lateral aspect.  Normal range of motion both active/passive without significant pain.  Seen by orthopedics once again yesterday afternoon with no recommendation of surgical intervention at this time and to continue IV antibiotics.  No other specific questions or concerns at this time.  Patient denies headache, no dizziness, no chest pain, no palpitations, no shortness of breath, no abdominal pain, no fever/chills, no nausea/vomiting/diarrhea.  No acute concerns overnight per nursing staff.  Objective: Vitals:   06/28/22 0458 06/28/22 1925 06/29/22 0500 06/29/22 0637  BP: (!) 127/57 (!) 110/54  (!) 110/47  Pulse: (!) 55 (!) 59  (!) 54  Resp: 13 16  16   Temp: 97.6 F (36.4 C) 98.6 F (37 C)  97.6 F (36.4 C)  TempSrc: Oral Oral  Oral  SpO2: 99% 96%  94%  Weight:   55.3 kg   Height:        Intake/Output Summary (Last 24 hours) at  06/29/2022 1130 Last data filed at 06/28/2022 1954 Gross per 24 hour  Intake 240 ml  Output 900 ml  Net -660 ml   Filed Weights   06/26/22 0600 06/27/22 0500 06/29/22 0500  Weight: 57.8 kg 60.5 kg 55.3 kg    Examination:  Physical Exam: GEN: NAD, alert and oriented x 3, chronically ill/elderly in appearance HEENT: NCAT, PERRL, EOMI, sclera clear, MMM PULM: CTAB w/o wheezes/crackles, normal respiratory effort on room air CV: RRR w/ SEM , no gallop/rub GI: abd soft, NTND,  NABS, no R/G/M MSK: Moves all extremities independently, slightly tender to palpation posterior aspect right elbow and lateral olecranon process, normal passive/active range of motion with minimal pain NEURO: CN II-XII intact, no focal deficits, sensation to light touch intact PSYCH: normal mood/affect Integumentary: Chronic wound/skin changes and right elbow/forearm as depicted below             Data Reviewed: I have personally reviewed following labs and imaging studies  CBC: Recent Labs  Lab 06/23/22 1734 06/24/22 0412 06/25/22 0355 06/26/22 0357 06/27/22 0425 06/28/22 0410 06/29/22 0407  WBC 15.1* 20.5* 16.0* 12.0* 8.7 7.4 7.9  NEUTROABS 13.5* 18.4* 14.7*  --   --   --   --   HGB 11.1* 10.9* 10.4* 10.2* 10.0* 10.6* 10.1*  HCT 35.4* 34.7* 32.9* 33.3* 32.0* 32.9* 31.5*  MCV 94.9 95.9 95.1 97.4 94.4 94.8 94.3  PLT 186 167 159 172 175 172 184   Basic Metabolic Panel: Recent Labs  Lab 06/24/22 0412 06/25/22 0355 06/26/22 0357 06/27/22 0425 06/28/22 0410  NA 135 133* 133* 133* 133*  K 4.3 4.5 4.3 4.5 4.6  CL 101 102 102 102 99  CO2 25 21* 21* 23 26  GLUCOSE 122* 116* 93 102* 91  BUN 37* 41* 42* 42* 40*  CREATININE 1.43* 1.24 1.14 1.12 1.23  CALCIUM 8.5* 8.5* 8.1* 8.2* 8.3*  MG 2.4  --   --   --   --    GFR: Estimated Creatinine Clearance: 28.7 mL/min (by C-G formula based on SCr of 1.23 mg/dL). Liver Function Tests: Recent Labs  Lab 06/24/22 0412  AST 17  ALT 15  ALKPHOS 105  BILITOT 1.3*  PROT 6.8  ALBUMIN 2.4*   No results for input(s): "LIPASE", "AMYLASE" in the last 168 hours. No results for input(s): "AMMONIA" in the last 168 hours. Coagulation Profile: Recent Labs  Lab 06/25/22 0355 06/26/22 0357 06/27/22 0425 06/28/22 0410 06/29/22 0407  INR 3.2* 2.7* 2.3* 2.2* 2.3*   Cardiac Enzymes: No results for input(s): "CKTOTAL", "CKMB", "CKMBINDEX", "TROPONINI" in the last 168 hours. BNP (last 3 results) No results for input(s): "PROBNP"  in the last 8760 hours. HbA1C: No results for input(s): "HGBA1C" in the last 72 hours. CBG: No results for input(s): "GLUCAP" in the last 168 hours. Lipid Profile: No results for input(s): "CHOL", "HDL", "LDLCALC", "TRIG", "CHOLHDL", "LDLDIRECT" in the last 72 hours. Thyroid Function Tests: No results for input(s): "TSH", "T4TOTAL", "FREET4", "T3FREE", "THYROIDAB" in the last 72 hours. Anemia Panel: No results for input(s): "VITAMINB12", "FOLATE", "FERRITIN", "TIBC", "IRON", "RETICCTPCT" in the last 72 hours. Sepsis Labs: Recent Labs  Lab 06/23/22 1734 06/26/22 1230  PROCALCITON  --  0.18  LATICACIDVEN 1.2  --     Recent Results (from the past 240 hour(s))  Culture, blood (Routine X 2) w Reflex to ID Panel     Status: Abnormal   Collection Time: 06/23/22  5:34 PM   Specimen: BLOOD  Result Value Ref Range  Status   Specimen Description   Final    BLOOD RIGHT ANTECUBITAL Performed at [facility], [address], [place], [place] [address]    Special Requests   Final    BOTTLES DRAWN AEROBIC AND ANAEROBIC Blood Culture results may not be optimal due to an inadequate volume of blood received in culture bottles Performed at [facility], [address], [place], [place] [address]    Culture  Setup Time   Final    GRAM POSITIVE COCCI IN CHAINS IN BOTH AEROBIC AND ANAEROBIC BOTTLES CRITICAL RESULT CALLED TO, READ BACK BY AND VERIFIED WITH: PHARMD CRYSTAL R. [id number] FCP    Culture (A)  Final    GROUP A STREP (S.PYOGENES) ISOLATED HEALTH DEPARTMENT NOTIFIED Performed at [facility], [address], [place], [place] [address]    Report Status 06/26/2022 FINAL  Final   Organism ID, Bacteria GROUP A STREP (S.PYOGENES) ISOLATED  Final      Susceptibility   Group a strep (s.pyogenes) isolated - MIC*    PENICILLIN <=0.06 SENSITIVE Sensitive     CEFTRIAXONE <=0.12 SENSITIVE Sensitive     ERYTHROMYCIN >=8 RESISTANT Resistant      LEVOFLOXACIN 0.5 SENSITIVE Sensitive     VANCOMYCIN 0.25 SENSITIVE Sensitive     * GROUP A STREP (S.PYOGENES) ISOLATED  Culture, blood (Routine X 2) w Reflex to ID Panel     Status: Abnormal   Collection Time: 06/23/22  5:34 PM   Specimen: BLOOD LEFT FOREARM  Result Value Ref Range Status   Specimen Description   Final    BLOOD LEFT FOREARM Performed at [facility], [address], [place], [place] [address]    Special Requests   Final    BOTTLES DRAWN AEROBIC AND ANAEROBIC Blood Culture adequate volume Performed at [facility], [address] [place], [place] [address]    Culture  Setup Time   Final    GRAM POSITIVE COCCI IN CHAINS IN BOTH AEROBIC AND ANAEROBIC BOTTLES    Culture (A)  Final    GROUP A STREP (S.PYOGENES) ISOLATED SUSCEPTIBILITIES PERFORMED ON PREVIOUS CULTURE WITHIN THE LAST 5 DAYS. HEALTH DEPARTMENT NOTIFIED Performed at [facility], [address], [place], [place] [address]    Report Status 06/26/2022 FINAL  Final  Blood Culture ID Panel (Reflexed)     Status: Abnormal   Collection Time: 06/23/22  5:34 PM  Result Value Ref Range Status   Enterococcus faecalis NOT DETECTED NOT DETECTED Final   Enterococcus Faecium NOT DETECTED NOT DETECTED Final   Listeria monocytogenes NOT DETECTED NOT DETECTED Final   Staphylococcus species NOT DETECTED NOT DETECTED Final   Staphylococcus aureus (BCID) NOT DETECTED NOT DETECTED Final   Staphylococcus epidermidis NOT DETECTED NOT DETECTED Final   Staphylococcus lugdunensis NOT DETECTED NOT DETECTED Final   Streptococcus species DETECTED (A) NOT DETECTED Final    Comment: CRITICAL RESULT CALLED TO, READ BACK BY AND VERIFIED WITH: PHARMD CRYSTAL R. [id number] FCP    Streptococcus agalactiae NOT DETECTED NOT DETECTED Final   Streptococcus pneumoniae NOT DETECTED NOT DETECTED Final   Streptococcus pyogenes DETECTED (A) NOT DETECTED Final    Comment: CRITICAL RESULT CALLED TO, READ BACK BY  AND VERIFIED WITH: PHARMD CRYSTAL R. [id number] FCP    A.calcoaceticus-baumannii NOT DETECTED NOT DETECTED Final   Bacteroides fragilis NOT DETECTED NOT DETECTED Final   Enterobacterales NOT DETECTED NOT DETECTED Final   Enterobacter cloacae complex  NOT DETECTED NOT DETECTED Final   Escherichia coli NOT DETECTED NOT DETECTED Final   Klebsiella aerogenes NOT DETECTED NOT DETECTED Final   Klebsiella oxytoca NOT DETECTED NOT DETECTED Final   Klebsiella pneumoniae NOT DETECTED NOT DETECTED Final   Proteus species NOT DETECTED NOT DETECTED Final   Salmonella species NOT DETECTED NOT DETECTED Final   Serratia marcescens NOT DETECTED NOT DETECTED Final   Haemophilus influenzae NOT DETECTED NOT DETECTED Final   Neisseria meningitidis NOT DETECTED NOT DETECTED Final   Pseudomonas aeruginosa NOT DETECTED NOT DETECTED Final   Stenotrophomonas maltophilia NOT DETECTED NOT DETECTED Final   Candida albicans NOT DETECTED NOT DETECTED Final   Candida auris NOT DETECTED NOT DETECTED Final   Candida glabrata NOT DETECTED NOT DETECTED Final   Candida krusei NOT DETECTED NOT DETECTED Final   Candida parapsilosis NOT DETECTED NOT DETECTED Final   Candida tropicalis NOT DETECTED NOT DETECTED Final   Cryptococcus neoformans/gattii NOT DETECTED NOT DETECTED Final    Comment: Performed at [facility], [address] [place], [place] [address]  MRSA Next Gen by PCR, Nasal     Status: Abnormal   Collection Time: 06/24/22 12:22 PM   Specimen: Nasal Mucosa; Nasal Swab  Result Value Ref Range Status   MRSA by PCR Next Gen DETECTED (A) NOT DETECTED Final    Comment: (NOTE) The GeneXpert MRSA Assay (FDA approved for NASAL specimens only), is one component of a comprehensive MRSA colonization surveillance program. It is not intended to diagnose MRSA infection nor to guide or monitor treatment for MRSA infections. Test performance is not FDA approved in patients less than 37 years old. Performed at  [facility], [address] [place], [place] [address]   Culture, blood (Routine X 2) w Reflex to ID Panel     Status: None (Preliminary result)   Collection Time: 06/25/22 10:13 AM   Specimen: BLOOD LEFT ARM  Result Value Ref Range Status   Specimen Description   Final    BLOOD LEFT ARM Performed at [facility], [address], [place], [place] [address]    Special Requests   Final    BOTTLES DRAWN AEROBIC AND ANAEROBIC Blood Culture adequate volume Performed at [facility], [address], [place], [place] [address]    Culture   Final    NO GROWTH 4 DAYS Performed at [facility], [address], [place], [place] [address]    Report Status PENDING  Incomplete  Culture, blood (Routine X 2) w Reflex to ID Panel     Status: None (Preliminary result)   Collection Time: 06/25/22 10:18 AM   Specimen: BLOOD LEFT HAND  Result Value Ref Range Status   Specimen Description   Final    BLOOD LEFT HAND Performed at [facility], [address], [place], [place] [address]    Special Requests   Final    AEROBIC BOTTLE ONLY Blood Culture adequate volume Performed at [facility], [address], [place], [place] [address]    Culture   Final    NO GROWTH 4 DAYS Performed at [facility], [address] [place], [place] [address]    Report Status PENDING  Incomplete         Radiology Studies: MR ELBOW RIGHT W WO CONTRAST  Result Date: 06/27/2022 CLINICAL DATA:  Joint effusion.  Elbow cellulitis/bursitis. EXAM: MRI OF THE RIGHT ELBOW WITHOUT AND WITH CONTRAST TECHNIQUE:  Multiplanar, multisequence MR imaging of the elbow was performed before and after the administration of intravenous contrast. CONTRAST:  6mL GADAVIST GADOBUTROL 1 MMOL/ML IV SOLN COMPARISON:  None Available. FINDINGS: TENDONS Common forearm flexor origin: Intact Common forearm extensor origin: Intact Biceps:  Intact Triceps: Intact LIGAMENTS Medial stabilizers: Intact Lateral stabilizers:  Intact Cartilage: No chondral defect. Joint: Small elbow joint effusion. Cubital tunnel: Normal. Bones: No fracture or dislocation. Mildly increased signal of the proximal ulna, which is likely reactive secondary to adjacent inflammatory process. Soft Tissues: There is a peripherally enhancing fluid collection about the posterior aspect of the elbow joint which measures at least 1.8 x 3.2 x 5.5 cm. This may represent olecranon bursitis, however due to skin irregularity and surrounding subcutaneous soft tissue edema an abscess can not be excluded, clinical correlation is suggested. IMPRESSION: 1. There is a peripherally enhancing fluid collection about the posterior aspect of the elbow joint which measures at least 1.8 x 3.2 x 5.5 cm. This may represent olecranon bursitis, however due to skin irregularity and surrounding subcutaneous soft tissue edema an abscess can not be excluded, clinical correlation is suggested. 2. Small elbow joint effusion. 3. Mildly increased signal of the proximal ulna, which could be reactive secondary to adjacent bursitis/inflammatory changes, however if there is adjacent skin wound, early osteomyelitis can not be excluded. Electronically Signed   By: Larose Hires D.O.   On: 06/27/2022 23:54   MR FOREARM RIGHT W WO CONTRAST  Result Date: 06/27/2022 CLINICAL DATA:  Osteomyelitis suspected.  Open wound. EXAM: MRI OF THE RIGHT FOREARM WITHOUT AND WITH CONTRAST TECHNIQUE: Multiplanar, multisequence MR imaging of the right forearm was performed before and after the administration of intravenous contrast. CONTRAST:  6mL GADAVIST GADOBUTROL 1 MMOL/ML IV SOLN COMPARISON:  None Available. FINDINGS: Bones/Joint/Cartilage There is mild edema about the olecranon process and proximal ulna with enhancement on post contrast sequences concerning for early osteomyelitis. Marrow signal within the radius and distal ulna is  within normal limits. There is small elbow joint effusion. Ligaments Intact. Muscles and Tendons There is generalized muscle atrophy of the flexor and extensor muscles of the forearm. No evidence of intramuscular fluid collection or abscess. Tendons of the flexor and extensor compartment appear intact. Soft tissues Skin thickening and subcutaneous soft tissue edema about the posterior aspect of the proximal forearm. There is enhancement of the soft tissues with a small partially imaged fluid collection concerning for small abscess/fluid collection. IMPRESSION: 1. Mild edema about the olecranon process and proximal ulna with enhancement on post contrast sequences concerning for early osteomyelitis. 2. Skin thickening and subcutaneous soft tissue edema about the posterior aspect of the proximal forearm with small partially imaged fluid collection concerning for small abscess/fluid collection. 3. Small elbow joint effusion. Electronically Signed   By: Larose Hires D.O.   On: 06/27/2022 23:40        Scheduled Meds:  atorvastatin  20 mg Oral Daily   Chlorhexidine Gluconate Cloth  6 each Topical Q0600   fesoterodine  8 mg Oral Daily   finasteride  5 mg Oral Daily   furosemide  60 mg Oral Daily   isosorbide mononitrate  30 mg Oral Daily   lactulose  20 g Oral Daily   metoprolol succinate  12.5 mg Oral Daily   multivitamin  1 tablet Oral Daily   mupirocin ointment  1 Application Nasal BID   polyethylene glycol  17 g Oral QODAY   sacubitril-valsartan  1 tablet Oral BID   spironolactone  25 mg Oral Daily   tamsulosin  0.4 mg Oral Daily   Warfarin - Pharmacist Dosing Inpatient   Does not apply q1600   Continuous Infusions:   ceFAZolin (ANCEF) IV 2 g (06/29/22 0540)     LOS: 5 days    Time spent: 50 minutes spent on chart review, discussion with nursing staff, consultants, updating family and interview/physical exam; more than 50% of that time was spent in counseling and/or coordination of  care.    Alvira Philips [place], DO [facility] Available via Epic secure chat 7am-7pm After these hours, please refer to coverage provider listed on [email or web address] 06/29/2022, 11:30 AM

## 2022-06-29 NOTE — [place] FL2 (Signed)
[place] MEDICAID FL2 LEVEL OF CARE FORM     IDENTIFICATION  Patient Name: Tyler Williams Birthdate: 1926-04-28 Sex: male Admission Date (Current Location): 06/23/2022  [facility] and [place] Number:  Producer, television/film/video and Address:  [facility],  [address] [place], [place] [address]      Provider Number: [id number]  Attending Physician Name and Address:  [place], Eric J, DO  Relative Name and Phone Number:  Olevia Perches [phone]    Current Level of Care: Hospital Recommended Level of Care: Skilled Nursing Facility Prior Approval Number:    Date Approved/Denied:   PASRR Number: [id number]  Discharge Plan: SNF    Current Diagnoses: Patient Active Problem List   Diagnosis Date Noted   Streptococcal bacteremia 06/24/2022   Pressure injury of skin 06/24/2022   Right arm cellulitis 06/23/2022   Olecranon bursitis of right elbow 06/23/2022   DNR (do not resuscitate)/DNI(Do Not Intubate) 06/23/2022   Chronic systolic CHF (congestive heart failure) (HCC) 04/10/2022   Normocytic anemia 11/21/2021   CKD stage 3a, GFR 45-59 ml/min (HCC) 11/21/2021   Thrombocytopenia (HCC) 11/21/2021   BPH (benign prostatic hyperplasia) 11/21/2021   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) 10/05/2021   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   Pain of left hand 08/12/2020   Abrasion of unspecified back wall of thorax, initial encounter 07/23/2020   Fall 07/23/2020   Hearing loss 07/23/2020   Laceration without foreign body of right elbow, initial encounter 07/23/2020   Gross hematuria    DCM (dilated cardiomyopathy) (HCC)    Chronic atrial fibrillation (HCC)    Congestive heart failure (HCC) 06/12/2020   Dyspnea 04/24/2019   Chronic obstructive pulmonary disease (HCC) 06/10/2018   Hardening of the aorta (main artery of the heart) (HCC) 06/10/2018   Hypertensive heart and chronic kidney disease with heart failure and stage 1 through stage 4 chronic kidney disease,  or unspecified chronic kidney disease (HCC) 04/24/2018   Aorta aneurysm (HCC) 02/14/2018   Cirrhosis of liver (HCC) 02/08/2018   Constipation 01/15/2018   Acute on chronic combined systolic and diastolic CHF (congestive heart failure) (HCC) 09/25/2017   Anticoagulated on Coumadin 08/28/2017   S/P TAVR (transcatheter aortic valve replacement)    Presence of prosthetic heart valve 09/06/2016   S/P TAVR (transcatheter aortic valve replacement) 06/13/2016   Incidental cecal mass noted on CT imaging 05/24/2016   Inguinal hernia 02/24/2016   Exudative age-related macular 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 (HCC) 12/22/2014   Non-thrombocytopenic purpura (HCC) 08/19/2014   Encounter for therapeutic drug monitoring 03/20/2013   Pseudophakia of both eyes 03/19/2013   Macular degeneration 03/19/2013   Hematuria, microscopic 12/03/2012   Anxiety disorder 06/20/2012   Long term (current) use of anticoagulants 06/20/2012   Cystoid macular edema 05/14/2012   Overweight (BMI 25.0-29.9) 01/23/2012   Hyponatremia 01/23/2012   S/P right UKR 01/22/2012   Carotid artery bruit 11/21/2011   Carotid bruit 11/21/2011   Presence of intraocular lens 02/16/2011   Osteoarthritis 07/05/2010   EDEMA 07/14/2009   Essential hypertension 01/27/2009   HYPERCHOLESTEROLEMIA  IIA 10/21/2008   CAD (coronary artery disease) 10/21/2008   Permanent atrial fibrillation (HCC) 10/21/2008    Orientation RESPIRATION BLADDER Height & Weight     Self, Situation, Place  Normal Incontinent Weight: 121 lb 14.6 oz (55.3 kg) Height:  5\' 8"  (172.7 cm)  BEHAVIORAL SYMPTOMS/MOOD NEUROLOGICAL BOWEL NUTRITION STATUS      Continent Diet (See discharge summary)  AMBULATORY STATUS COMMUNICATION OF  NEEDS Skin   Extensive Assist Verbally PU Stage and Appropriate Care PU Stage 1 Dressing: Daily                     Personal Care Assistance Level of Assistance  Bathing, Feeding, Dressing Bathing  Assistance: Limited assistance Feeding assistance: Independent Dressing Assistance: Maximum assistance     Functional Limitations Info  Sight, Hearing, Speech Sight Info: Impaired Hearing Info: Adequate Speech Info: Adequate    SPECIAL CARE FACTORS FREQUENCY  PT (By licensed PT), OT (By licensed OT)     PT Frequency: 5x/wk OT Frequency: 5x/wk            Contractures Contractures Info: Not present    Additional Factors Info  Code Status, Allergies Code Status Info: DNR Allergies Info: No Known Allergies           Current Medications (06/29/2022):  This is the current hospital active medication list Current Facility-Administered Medications  Medication Dose Route Frequency Provider Last Rate Last Admin   acetaminophen (TYLENOL) tablet 650 mg  650 mg Oral Q6H PRN Carollee Herter, DO       Or   acetaminophen (TYLENOL) suppository 650 mg  650 mg Rectal Q6H PRN Carollee Herter, DO       atorvastatin (LIPITOR) tablet 20 mg  20 mg Oral Daily Carollee Herter, DO   20 mg at 06/29/22 0909   ceFAZolin (ANCEF) IVPB 2g/100 mL premix  2 g Intravenous Joanette Gula, MD 200 mL/hr at 06/29/22 1326 2 g at 06/29/22 1326   Chlorhexidine Gluconate Cloth 2 % PADS 6 each  6 each Topical Q0600 Hollice Espy, MD   6 each at 06/29/22 0539   fesoterodine (TOVIAZ) tablet 8 mg  8 mg Oral Daily Carollee Herter, DO   8 mg at 06/29/22 0910   finasteride (PROSCAR) tablet 5 mg  5 mg Oral Daily Carollee Herter, DO   5 mg at 06/29/22 1610   furosemide (LASIX) tablet 60 mg  60 mg Oral Daily [place], Eric J, DO   60 mg at 06/29/22 9604   isosorbide mononitrate (IMDUR) 24 hr tablet 30 mg  30 mg Oral Daily Carollee Herter, DO   30 mg at 06/29/22 0909   lactulose (CHRONULAC) 10 GM/15ML solution 20 g  20 g Oral Daily Carollee Herter, DO   20 g at 06/29/22 5409   LORazepam (ATIVAN) tablet 1 mg  1 mg Oral BID PRN Carollee Herter, DO   1 mg at 06/28/22 2128   melatonin tablet 10 mg  10 mg Oral QHS PRN Carollee Herter, DO       metoprolol succinate  (TOPROL-XL) 24 hr tablet 12.5 mg  12.5 mg Oral Daily Carollee Herter, DO   12.5 mg at 06/29/22 8119   multivitamin (PROSIGHT) tablet 1 tablet  1 tablet Oral Daily Jonah Blue, MD   1 tablet at 06/29/22 0909   mupirocin ointment (BACTROBAN) 2 % 1 Application  1 Application Nasal BID Hollice Espy, MD   1 Application at 06/29/22 0910   ondansetron (ZOFRAN) tablet 4 mg  4 mg Oral Q6H PRN Carollee Herter, DO       Or   ondansetron [facility]) injection 4 mg  4 mg Intravenous Q6H PRN Carollee Herter, DO       oxyCODONE (Oxy IR/ROXICODONE) immediate release tablet 5 mg  5 mg Oral Q4H PRN Jonah Blue, MD   5 mg at 06/28/22 2128   polyethylene glycol (MIRALAX / GLYCOLAX) packet  17 g  17 g Oral Heron Nay, MD   17 g at 06/29/22 0910   polyethylene glycol (MIRALAX / GLYCOLAX) packet 17 g  17 g Oral Daily PRN Jonah Blue, MD       sacubitril-valsartan (ENTRESTO) 24-26 mg per tablet  1 tablet Oral BID Carollee Herter, DO   1 tablet at 06/29/22 1610   spironolactone (ALDACTONE) tablet 25 mg  25 mg Oral Daily Carollee Herter, DO   25 mg at 06/29/22 9604   tamsulosin (FLOMAX) capsule 0.4 mg  0.4 mg Oral Daily Carollee Herter, DO   0.4 mg at 06/29/22 5409   Warfarin - Pharmacist Dosing Inpatient   Does not apply q1600 Donell Beers, [facility]         Discharge Medications: Please see discharge summary for a list of discharge medications.  Relevant Imaging Results:  Relevant Lab Results:   Additional Information SSN:[id number]  Otelia Santee, LCSW

## 2022-06-30 ENCOUNTER — Inpatient Hospital Stay (HOSPITAL_COMMUNITY): Payer: Medicare Other

## 2022-06-30 ENCOUNTER — Other Ambulatory Visit: Payer: Self-pay

## 2022-06-30 DIAGNOSIS — I739 Peripheral vascular disease, unspecified: Secondary | ICD-10-CM | POA: Diagnosis not present

## 2022-06-30 DIAGNOSIS — R531 Weakness: Secondary | ICD-10-CM | POA: Diagnosis not present

## 2022-06-30 DIAGNOSIS — R1314 Dysphagia, pharyngoesophageal phase: Secondary | ICD-10-CM | POA: Diagnosis not present

## 2022-06-30 DIAGNOSIS — K219 Gastro-esophageal reflux disease without esophagitis: Secondary | ICD-10-CM | POA: Diagnosis not present

## 2022-06-30 DIAGNOSIS — I4821 Permanent atrial fibrillation: Secondary | ICD-10-CM | POA: Diagnosis not present

## 2022-06-30 DIAGNOSIS — I480 Paroxysmal atrial fibrillation: Secondary | ICD-10-CM | POA: Diagnosis not present

## 2022-06-30 DIAGNOSIS — Z66 Do not resuscitate: Secondary | ICD-10-CM | POA: Diagnosis not present

## 2022-06-30 DIAGNOSIS — M7021 Olecranon bursitis, right elbow: Secondary | ICD-10-CM | POA: Diagnosis not present

## 2022-06-30 DIAGNOSIS — E0781 Sick-euthyroid syndrome: Secondary | ICD-10-CM | POA: Diagnosis not present

## 2022-06-30 DIAGNOSIS — I130 Hypertensive heart and chronic kidney disease with heart failure and stage 1 through stage 4 chronic kidney disease, or unspecified chronic kidney disease: Secondary | ICD-10-CM | POA: Diagnosis not present

## 2022-06-30 DIAGNOSIS — I5043 Acute on chronic combined systolic (congestive) and diastolic (congestive) heart failure: Secondary | ICD-10-CM | POA: Diagnosis not present

## 2022-06-30 DIAGNOSIS — Z452 Encounter for adjustment and management of vascular access device: Secondary | ICD-10-CM | POA: Diagnosis not present

## 2022-06-30 DIAGNOSIS — M6281 Muscle weakness (generalized): Secondary | ICD-10-CM | POA: Diagnosis not present

## 2022-06-30 DIAGNOSIS — R2681 Unsteadiness on feet: Secondary | ICD-10-CM | POA: Diagnosis not present

## 2022-06-30 DIAGNOSIS — J90 Pleural effusion, not elsewhere classified: Secondary | ICD-10-CM | POA: Diagnosis not present

## 2022-06-30 DIAGNOSIS — N400 Enlarged prostate without lower urinary tract symptoms: Secondary | ICD-10-CM | POA: Diagnosis not present

## 2022-06-30 DIAGNOSIS — N1831 Chronic kidney disease, stage 3a: Secondary | ICD-10-CM | POA: Diagnosis not present

## 2022-06-30 DIAGNOSIS — L03113 Cellulitis of right upper limb: Secondary | ICD-10-CM | POA: Diagnosis not present

## 2022-06-30 DIAGNOSIS — L988 Other specified disorders of the skin and subcutaneous tissue: Secondary | ICD-10-CM | POA: Diagnosis not present

## 2022-06-30 DIAGNOSIS — W19XXXD Unspecified fall, subsequent encounter: Secondary | ICD-10-CM | POA: Diagnosis not present

## 2022-06-30 DIAGNOSIS — E785 Hyperlipidemia, unspecified: Secondary | ICD-10-CM | POA: Diagnosis not present

## 2022-06-30 DIAGNOSIS — I2510 Atherosclerotic heart disease of native coronary artery without angina pectoris: Secondary | ICD-10-CM | POA: Diagnosis not present

## 2022-06-30 DIAGNOSIS — L0390 Cellulitis, unspecified: Secondary | ICD-10-CM | POA: Diagnosis not present

## 2022-06-30 DIAGNOSIS — B950 Streptococcus, group A, as the cause of diseases classified elsewhere: Secondary | ICD-10-CM | POA: Diagnosis not present

## 2022-06-30 DIAGNOSIS — K7460 Unspecified cirrhosis of liver: Secondary | ICD-10-CM | POA: Diagnosis not present

## 2022-06-30 DIAGNOSIS — R2689 Other abnormalities of gait and mobility: Secondary | ICD-10-CM | POA: Diagnosis not present

## 2022-06-30 DIAGNOSIS — I959 Hypotension, unspecified: Secondary | ICD-10-CM | POA: Diagnosis not present

## 2022-06-30 DIAGNOSIS — I872 Venous insufficiency (chronic) (peripheral): Secondary | ICD-10-CM | POA: Diagnosis not present

## 2022-06-30 DIAGNOSIS — I4519 Other right bundle-branch block: Secondary | ICD-10-CM | POA: Diagnosis not present

## 2022-06-30 DIAGNOSIS — I5032 Chronic diastolic (congestive) heart failure: Secondary | ICD-10-CM | POA: Diagnosis not present

## 2022-06-30 DIAGNOSIS — D631 Anemia in chronic kidney disease: Secondary | ICD-10-CM | POA: Diagnosis not present

## 2022-06-30 DIAGNOSIS — E559 Vitamin D deficiency, unspecified: Secondary | ICD-10-CM | POA: Diagnosis not present

## 2022-06-30 DIAGNOSIS — I4891 Unspecified atrial fibrillation: Secondary | ICD-10-CM | POA: Diagnosis not present

## 2022-06-30 DIAGNOSIS — L03119 Cellulitis of unspecified part of limb: Secondary | ICD-10-CM | POA: Diagnosis not present

## 2022-06-30 DIAGNOSIS — D464 Refractory anemia, unspecified: Secondary | ICD-10-CM | POA: Diagnosis not present

## 2022-06-30 DIAGNOSIS — R1915 Other abnormal bowel sounds: Secondary | ICD-10-CM | POA: Diagnosis not present

## 2022-06-30 DIAGNOSIS — I129 Hypertensive chronic kidney disease with stage 1 through stage 4 chronic kidney disease, or unspecified chronic kidney disease: Secondary | ICD-10-CM | POA: Diagnosis not present

## 2022-06-30 DIAGNOSIS — K7469 Other cirrhosis of liver: Secondary | ICD-10-CM | POA: Diagnosis not present

## 2022-06-30 DIAGNOSIS — R7881 Bacteremia: Secondary | ICD-10-CM | POA: Diagnosis not present

## 2022-06-30 DIAGNOSIS — L89311 Pressure ulcer of right buttock, stage 1: Secondary | ICD-10-CM | POA: Diagnosis not present

## 2022-06-30 DIAGNOSIS — I350 Nonrheumatic aortic (valve) stenosis: Secondary | ICD-10-CM | POA: Diagnosis not present

## 2022-06-30 DIAGNOSIS — Z79899 Other long term (current) drug therapy: Secondary | ICD-10-CM | POA: Diagnosis not present

## 2022-06-30 DIAGNOSIS — I5023 Acute on chronic systolic (congestive) heart failure: Secondary | ICD-10-CM | POA: Diagnosis not present

## 2022-06-30 LAB — CBC
HCT: 32 % — ABNORMAL LOW (ref 39.0–52.0)
Hemoglobin: 10.1 g/dL — ABNORMAL LOW (ref 13.0–17.0)
MCH: 29.8 pg (ref 26.0–34.0)
MCHC: 31.6 g/dL (ref 30.0–36.0)
MCV: 94.4 fL (ref 80.0–100.0)
Platelets: 191 10*3/uL (ref 150–400)
RBC: 3.39 MIL/uL — ABNORMAL LOW (ref 4.22–5.81)
RDW: 16 % — ABNORMAL HIGH (ref 11.5–15.5)
WBC: 7.9 10*3/uL (ref 4.0–10.5)
nRBC: 0 % (ref 0.0–0.2)

## 2022-06-30 LAB — BASIC METABOLIC PANEL
Anion gap: 6 (ref 5–15)
BUN: 42 mg/dL — ABNORMAL HIGH (ref 8–23)
CO2: 24 mmol/L (ref 22–32)
Calcium: 7.9 mg/dL — ABNORMAL LOW (ref 8.9–10.3)
Chloride: 102 mmol/L (ref 98–111)
Creatinine, Ser: 1.23 mg/dL (ref 0.61–1.24)
GFR, Estimated: 54 mL/min — ABNORMAL LOW (ref >60)
Glucose, Bld: 95 mg/dL (ref 70–99)
Potassium: 4.1 mmol/L (ref 3.5–5.1)
Sodium: 132 mmol/L — ABNORMAL LOW (ref 135–145)

## 2022-06-30 LAB — HEPATITIS B CORE ANTIBODY, TOTAL

## 2022-06-30 LAB — SEDIMENTATION RATE: Sed Rate: 40 mm/hr — ABNORMAL HIGH (ref 0–16)

## 2022-06-30 LAB — C-REACTIVE PROTEIN: CRP: 4.3 mg/dL — ABNORMAL HIGH (ref <1.0)

## 2022-06-30 LAB — HEPATITIS B SURFACE ANTIGEN

## 2022-06-30 LAB — CULTURE, BLOOD (ROUTINE X 2)

## 2022-06-30 LAB — BRAIN NATRIURETIC PEPTIDE: B Natriuretic Peptide: 1207.1 pg/mL — ABNORMAL HIGH (ref 0.0–100.0)

## 2022-06-30 LAB — PROTIME-INR
INR: 2.5 — ABNORMAL HIGH (ref 0.8–1.2)
Prothrombin Time: 27.2 seconds — ABNORMAL HIGH (ref 11.4–15.2)

## 2022-06-30 MED ORDER — CEFAZOLIN IV (FOR PTA / DISCHARGE USE ONLY): 2.0000 g | Freq: Three times a day (TID) | INTRAVENOUS | 0 refills | Status: AC

## 2022-06-30 MED ORDER — LORAZEPAM 1 MG PO TABS: 1.0000 mg | ORAL_TABLET | Freq: Two times a day (BID) | ORAL | 0 refills | Status: AC | PRN

## 2022-06-30 MED ORDER — OXYCODONE HCL 5 MG PO TABS: 5.0000 mg | ORAL_TABLET | Freq: Four times a day (QID) | ORAL | 0 refills | Status: AC | PRN

## 2022-06-30 MED ORDER — SODIUM CHLORIDE 0.9% FLUSH: 10.0000 mL | Freq: Two times a day (BID) | INTRAVENOUS | Status: DC

## 2022-06-30 MED ORDER — SODIUM CHLORIDE 0.9% FLUSH: 10.0000 mL | INTRAVENOUS | Status: DC | PRN

## 2022-06-30 NOTE — Progress Notes (Signed)
Occupational Therapy Treatment Patient Details Name: Tyler Williams MRN: [id number] DOB: 1926-12-24 Today's Date: 06/30/2022   History of present illness Patient is a 89 year old male who presented from ALF after a fall with R arm skin tear with fever and worsening erythema. Patient was admitted with olecranon bursitis of R elbow, streptococcal bacteremia. PMH: macular degeneration, anxiety,   OT comments  Patient progressing and showed improved willingness to allow safety precautions such as use of gait belt and use of Stedy as pt with stated goal today to use the actual bathroom.  Pt stood to [place] from EOB and from [facility] on top of toilet with Max As of 1 person, compared to previous session when pt required assist of 2. Patient remains limited by very impaired vision from mac degeneration, anxiety and mild confusion, WB restrictions to RUE, generalized weakness and decreased activity tolerance along with deficits noted below. Pt continues to demonstrate fair rehab potential and would benefit from continued skilled OT to increase safety and independence with ADLs and functional transfers to allow pt to return home safely and reduce caregiver burden and fall risk.    Recommendations for follow up therapy are one component of a multi-disciplinary discharge planning process, led by the attending physician.  Recommendations may be updated based on patient status, additional functional criteria and insurance authorization.    Assistance Recommended at Discharge Frequent or constant Supervision/Assistance  Patient can return home with the following  A lot of help with walking and/or transfers;A lot of help with bathing/dressing/bathroom;Assistance with cooking/housework;Direct supervision/assist for medications management;Direct supervision/assist for financial management;Assist for transportation;Help with stairs or ramp for entrance   Equipment Recommendations  BSC/3in1    Recommendations for  Other Services      Precautions / Restrictions Precautions Precautions: Fall Precaution Comments: limit extension of elbow. "splint for comfort" Restrictions Weight Bearing Restrictions: Yes Other Position/Activity Restrictions: WB through forearm only, none through elbow. platform walker. Patient refused to use plarform RW       Mobility Bed Mobility Overal bed mobility: Needs Assistance Bed Mobility: Supine to Sit, Sit to Supine     Supine to sit: Mod assist, Max assist, HOB elevated Sit to supine: Max assist   General bed mobility comments: 2nd person needed to adress supeine post scoot    Transfers                         Balance Overall balance assessment: History of Falls, Needs assistance Sitting-balance support: Feet supported, No upper extremity supported Sitting balance-Leahy Scale: Fair     Standing balance support: Bilateral upper extremity supported, During functional activity Standing balance-Leahy Scale: Poor                             ADL either performed or assessed with clinical judgement   ADL       Grooming: Sitting;Set up;Wash/dry hands                   Toilet Transfer: BSC/3in1 Toilet Transfer Details (indicate cue type and reason): Pt stood from elevated EOB to [place], using LUE only to pull with Max As. Stedy taken to bathroom (per pt's request) with pt lowered to [facility] placed atop toilet with multimodal cues due to pt confusion/low vision and Mod As to control descent. Toileting- Clothing Manipulation and Hygiene: Total assistance;Sit to/from stand Toileting - Clothing Manipulation Details (indicate cue type and  reason): Pt received Total Assist peri care while holding Stedy with LUE for balance.            Extremity/Trunk Assessment Upper Extremity Assessment Upper Extremity Assessment: RUE deficits/detail RUE Deficits / Details: erythema/edema  R elbow, forearm gauzed   Lower Extremity Assessment Lower  Extremity Assessment: Defer to PT evaluation;RLE deficits/detail;LLE deficits/detail RLE Deficits / Details: dressings on lower legs, dark purple LLE Deficits / Details: same as right        Vision Baseline Vision/History: 1 Wears glasses Ability to See in Adequate Light: 3 Highly impaired     Perception     Praxis      Cognition Arousal/Alertness: Awake/alert Behavior During Therapy: Anxious (Pleasantly confused today) Overall Cognitive Status: No family/caregiver present to determine baseline cognitive functioning Area of Impairment: Attention, Following commands, Safety/judgement                   Current Attention Level: Sustained   Following Commands: Follows one step commands with increased time Safety/Judgement: Decreased awareness of safety, Decreased awareness of deficits Awareness: Emergent   General Comments: impaired safety, judgement, awareness, reasoning        Exercises      Shoulder Instructions       General Comments      Pertinent Vitals/ Pain          Home Living                                          Prior Functioning/Environment              Frequency  Min 2X/week        Progress Toward Goals  OT Goals(current goals can now be found in the care plan section)  Progress towards OT goals: Progressing toward goals  Acute Rehab OT Goals Patient Stated Goal: To go home. Pt discussing discharge with SW as OT leaving room. OT Goal Formulation: With patient Time For Goal Achievement: 07/12/22 Potential to Achieve Goals: Fair  Plan Discharge plan remains appropriate    Co-evaluation                 AM-PAC OT "6 Clicks" Daily Activity     Outcome Measure   Help from another person eating meals?: A Little Help from another person taking care of personal grooming?: A Little Help from another person toileting, which includes using toliet, bedpan, or urinal?: A Lot Help from another person bathing  (including washing, rinsing, drying)?: A Lot Help from another person to put on and taking off regular upper body clothing?: A Little Help from another person to put on and taking off regular lower body clothing?: A Lot 6 Click Score: 15    End of Session Equipment Utilized During Treatment: Gait belt Antony Salmon)  OT Visit Diagnosis: Unsteadiness on feet (R26.81);Other abnormalities of gait and mobility (R26.89);Muscle weakness (generalized) (M62.81)   Activity Tolerance Patient tolerated treatment well   Patient Left in chair;with call bell/phone within reach;with chair alarm set   Nurse Communication Other (comment) (RN to assist with supine scoot.)        Time: 1111-1212 OT Time Calculation (min): 61 min  Charges: OT General Charges $OT Visit: 1 Visit OT Treatments $Self Care/Home Management : 23-37 mins $Therapeutic Activity: 23-37 mins  Victorino Dike, OT Acute Rehab Services Office: [phone] 06/30/2022  Theodoro Clock 06/30/2022, 12:37 PM

## 2022-06-30 NOTE — Progress Notes (Signed)
[facility] here to transport patient to [facility] [employer], all patient's belongings sent with patient.

## 2022-06-30 NOTE — Progress Notes (Signed)
Peripherally Inserted Central Catheter Placement  The IV Nurse has discussed with the patient and/or persons authorized to consent for the patient, the purpose of this procedure and the potential benefits and risks involved with this procedure.  The benefits include less needle sticks, lab draws from the catheter, and the patient may be discharged home with the catheter. Risks include, but not limited to, infection, bleeding, blood clot (thrombus formation), and puncture of an artery; nerve damage and irregular heartbeat and possibility to perform a PICC exchange if needed/ordered by physician.  Alternatives to this procedure were also discussed.  Bard Power PICC patient education guide, fact sheet on infection prevention and patient information card has been provided to patient /or left at bedside.    PICC Placement Documentation  PICC Single Lumen 06/30/22 Left Basilic 40 cm 0 cm (Active)  Indication for Insertion or Continuance of Line Prolonged intravenous therapies 06/30/22 1655  Exposed Catheter (cm) 0 cm 06/30/22 1655  Site Assessment Clean, Dry, Intact;Bruised 06/30/22 1655  Line Status Saline locked;Flushed;Blood return noted 06/30/22 1655  Dressing Type Transparent;Securing device 06/30/22 1655  Dressing Status Antimicrobial disc in place;Clean, Dry, Intact 06/30/22 1655  Safety Lock Not Applicable 06/30/22 1655  Line Care Connections checked and tightened 06/30/22 1655  Dressing Intervention New dressing 06/30/22 1655  Dressing Change Due 07/07/22 06/30/22 1655     *Right arm restricted already for wound/infection. PICC placed in left arm, noted to already have swelling/edema from forearm extending into upper arm. PICC insertion site proximal to the edema.    Tyler Williams 06/30/2022, 4:57 PM

## 2022-06-30 NOTE — Progress Notes (Addendum)
[facility] Infectious Diseases Follow Up Note  Patient Identification: Patient Name: Tyler Williams MRN: [id number] Admit Date: 06/23/2022  4:33 PM Age: 89 y.o.Today's Date: 06/30/2022  Reason for Visit: Bacteremia   Principal Problem:   Right arm cellulitis Active Problems:   HYPERCHOLESTEROLEMIA  IIA   Essential hypertension   Permanent atrial fibrillation (HCC)   Macular degeneration   Anxiety disorder   Constipation   CKD stage 3a, GFR 45-59 ml/min (HCC)   BPH (benign prostatic hyperplasia)   Chronic systolic CHF (congestive heart failure) (HCC)   Olecranon bursitis of right elbow   DNR (do not resuscitate)/DNI(Do Not Intubate)   Streptococcal bacteremia   Pressure injury of skin   Antibiotics: Vancomycin 5/10 Ceftriaxone 5/10 Cefazolin 5/11-c Linezolid 5/13-5/16  Lines/Hardwares:  TAVR, left knee replacement, partial rt knee replacement  Interval Events: remains afebrile Seen by Dr Melvyn Novas who did not recommend surgical intervention and continue immobilization and IV abtx  Assessment 4 Y O male with PMH as below including CHFrEF, CKD, HTN, severe aortic stenosis s/p TAVR  who presented from ALF 2/2 fall. Admitted with   # Group A strep bacteremia 2/2 Repeat blood cx 5/12 NG in 5 days  5/12 TTE with no vegetations  Will forgo TEE as not common cause for endocarditis and known source   # Fall leading to rt forearm cellulitis and olecranon bursitis/abscess # Possible early osteomyelitis of olecranon process/proximal ulna   conservative management per Orthopedics. No surgical intervention recommended   # Bilateral venous stasis/LE swelling  # Liver cirrhosis 5/5 hep B surface ab < 3.5, HCV ab negative   Recommendations Complete 6 weeks of cefazolin from 5/12. EOT 6/22 Monitor CBC and CMP on abtx Fu will be arranged in clinic in 3-4 weeks. Fu hep panel  ID will so. Please recall if needed   OPAT   Diagnosis: elbow bursitis/abscess, possible osteomyelitis   Culture Result: strep pyogenes   No Known Allergies  OPAT Orders Discharge antibiotics to be given via PICC line Discharge antibiotics: cefazolin Per pharmacy protocol  Duration: 6 weeks End Date: 08/05/22  [facility] Care Per Protocol:  Home health RN for IV administration and teaching; PICC line care and labs.    Labs weekly while on IV antibiotics: X__ CBC with differential __ BMP X__ CMP X__ CRP X__ ESR __ Vancomycin trough __ CK  X__ Please pull PIC at completion of IV antibiotics __ Please leave PIC in place until doctor has seen patient or been notified  Fax weekly labs to [phone]  Clinic Follow Up Appt: 5/28 at 2: 41m pm   Rest of the management as per the primary team. Thank you for the consult. Please page with pertinent questions or concerns.  ______________________________________________________________________ Subjective patient seen and examined at the bedside. No complaints    Vitals BP (!) 116/55 (BP Location: Left Arm)   Pulse (!) 53   Temp 98.6 F (37 C) (Oral)   Resp 17   Ht 5\' 8"  (1.727 m)   Wt 58.5 kg   SpO2 93%   BMI 19.61 kg/m     Physical Exam Constitutional:  elderly male sitting in the  bed    Comments:   Cardiovascular:     Rate and Rhythm: Normal rate and Irregular rhythm.     Heart sounds:  Pulmonary:     Effort: Pulmonary effort is normal.     Comments:   Abdominal:     Palpations:     Tenderness: non distended  and non tender  Musculoskeletal:        General: swelling/redness and tenderness in the rt elbow is improving but still present. He has good Rom in his rty elbow. No concerns at other peripheral joints. Rt forearm is bandaged C/D/I  Skin:    Comments: No rashes, Bilateral chronic venous stasis/bilateral leg swelling   Neurological:     General: awake, alert and oriented, grossly non focal, following commands   Psychiatric:        Mood and  Affect: Mood normal.   Pertinent Microbiology Results for orders placed or performed during the hospital encounter of 06/23/22  Culture, blood (Routine X 2) w Reflex to ID Panel     Status: Abnormal   Collection Time: 06/23/22  5:34 PM   Specimen: BLOOD  Result Value Ref Range Status   Specimen Description   Final    BLOOD RIGHT ANTECUBITAL Performed at [facility], [address] [place], [place] [address]    Special Requests   Final    BOTTLES DRAWN AEROBIC AND ANAEROBIC Blood Culture results may not be optimal due to an inadequate volume of blood received in culture bottles Performed at [facility], [address], [place], [place] [address]    Culture  Setup Time   Final    GRAM POSITIVE COCCI IN CHAINS IN BOTH AEROBIC AND ANAEROBIC BOTTLES CRITICAL RESULT CALLED TO, READ BACK BY AND VERIFIED WITH: PHARMD CRYSTAL R. [id number] FCP    Culture (A)  Final    GROUP A STREP (S.PYOGENES) ISOLATED HEALTH DEPARTMENT NOTIFIED Performed at [facility], [address], [place], [place] [address]    Report Status 06/26/2022 FINAL  Final   Organism ID, Bacteria GROUP A STREP (S.PYOGENES) ISOLATED  Final      Susceptibility   Group a strep (s.pyogenes) isolated - MIC*    PENICILLIN <=0.06 SENSITIVE Sensitive     CEFTRIAXONE <=0.12 SENSITIVE Sensitive     ERYTHROMYCIN >=8 RESISTANT Resistant     LEVOFLOXACIN 0.5 SENSITIVE Sensitive     VANCOMYCIN 0.25 SENSITIVE Sensitive     * GROUP A STREP (S.PYOGENES) ISOLATED  Culture, blood (Routine X 2) w Reflex to ID Panel     Status: Abnormal   Collection Time: 06/23/22  5:34 PM   Specimen: BLOOD LEFT FOREARM  Result Value Ref Range Status   Specimen Description   Final    BLOOD LEFT FOREARM Performed at [facility], [address], [place], [place] [address]    Special Requests   Final    BOTTLES DRAWN AEROBIC AND ANAEROBIC Blood Culture adequate volume Performed at [facility], [address] [place], [place] [address]    Culture  Setup Time   Final    GRAM POSITIVE COCCI IN CHAINS IN BOTH AEROBIC AND ANAEROBIC BOTTLES    Culture (A)  Final    GROUP A STREP (S.PYOGENES) ISOLATED SUSCEPTIBILITIES PERFORMED ON PREVIOUS CULTURE WITHIN THE LAST 5 DAYS. HEALTH DEPARTMENT NOTIFIED Performed at [facility], [address] [place], [place] [address]    Report Status 06/26/2022 FINAL  Final  Blood Culture ID Panel (Reflexed)     Status: Abnormal   Collection Time: 06/23/22  5:34 PM  Result Value Ref Range Status   Enterococcus faecalis NOT DETECTED NOT DETECTED Final   Enterococcus Faecium NOT DETECTED NOT DETECTED Final   Listeria monocytogenes NOT DETECTED NOT DETECTED Final   Staphylococcus species NOT  DETECTED NOT DETECTED Final   Staphylococcus aureus (BCID) NOT DETECTED NOT DETECTED Final   Staphylococcus epidermidis NOT DETECTED NOT DETECTED Final   Staphylococcus lugdunensis NOT DETECTED NOT DETECTED Final   Streptococcus species DETECTED (A) NOT DETECTED Final    Comment: CRITICAL RESULT CALLED TO, READ BACK BY AND VERIFIED WITH: PHARMD CRYSTAL R. [id number] FCP    Streptococcus agalactiae NOT DETECTED NOT DETECTED Final   Streptococcus pneumoniae NOT DETECTED NOT DETECTED Final   Streptococcus pyogenes DETECTED (A) NOT DETECTED Final    Comment: CRITICAL RESULT CALLED TO, READ BACK BY AND VERIFIED WITH: PHARMD CRYSTAL R. [id number] FCP    A.calcoaceticus-baumannii NOT DETECTED NOT DETECTED Final   Bacteroides fragilis NOT DETECTED NOT DETECTED Final   Enterobacterales NOT DETECTED NOT DETECTED Final   Enterobacter cloacae complex NOT DETECTED NOT DETECTED Final   Escherichia coli NOT DETECTED NOT DETECTED Final   Klebsiella aerogenes NOT DETECTED NOT DETECTED Final   Klebsiella oxytoca NOT DETECTED NOT DETECTED Final   Klebsiella pneumoniae NOT DETECTED NOT DETECTED Final   Proteus species NOT DETECTED NOT DETECTED  Final   Salmonella species NOT DETECTED NOT DETECTED Final   Serratia marcescens NOT DETECTED NOT DETECTED Final   Haemophilus influenzae NOT DETECTED NOT DETECTED Final   Neisseria meningitidis NOT DETECTED NOT DETECTED Final   Pseudomonas aeruginosa NOT DETECTED NOT DETECTED Final   Stenotrophomonas maltophilia NOT DETECTED NOT DETECTED Final   Candida albicans NOT DETECTED NOT DETECTED Final   Candida auris NOT DETECTED NOT DETECTED Final   Candida glabrata NOT DETECTED NOT DETECTED Final   Candida krusei NOT DETECTED NOT DETECTED Final   Candida parapsilosis NOT DETECTED NOT DETECTED Final   Candida tropicalis NOT DETECTED NOT DETECTED Final   Cryptococcus neoformans/gattii NOT DETECTED NOT DETECTED Final    Comment: Performed at [facility], [address], [place], [place] [address]  MRSA Next Gen by PCR, Nasal     Status: Abnormal   Collection Time: 06/24/22 12:22 PM   Specimen: Nasal Mucosa; Nasal Swab  Result Value Ref Range Status   MRSA by PCR Next Gen DETECTED (A) NOT DETECTED Final    Comment: (NOTE) The GeneXpert MRSA Assay (FDA approved for NASAL specimens only), is one component of a comprehensive MRSA colonization surveillance program. It is not intended to diagnose MRSA infection nor to guide or monitor treatment for MRSA infections. Test performance is not FDA approved in patients less than 77 years old. Performed at [facility], [address], [place], [place] [address]   Culture, blood (Routine X 2) w Reflex to ID Panel     Status: None (Preliminary result)   Collection Time: 06/25/22 10:13 AM   Specimen: BLOOD LEFT ARM  Result Value Ref Range Status   Specimen Description   Final    BLOOD LEFT ARM Performed at [name], [address] [place], [place] [address]    Special Requests   Final    BOTTLES DRAWN AEROBIC AND ANAEROBIC Blood Culture adequate volume Performed at [facility], [address] [place], [place] [address]    Culture   Final    NO GROWTH 4 DAYS Performed at [facility], [address], [place], [place] [address]    Report Status PENDING  Incomplete  Culture, blood (Routine X 2) w Reflex to ID Panel     Status: None (Preliminary result)   Collection Time: 06/25/22 10:18 AM  Specimen: BLOOD LEFT HAND  Result Value Ref Range Status   Specimen Description   Final    BLOOD LEFT HAND Performed at [facility], [address], [place], [place] [address]    Special Requests   Final    AEROBIC BOTTLE ONLY Blood Culture adequate volume Performed at [facility], [address], [place], [place] [address]    Culture   Final    NO GROWTH 4 DAYS Performed at [facility], [address], [place], [place] [address]    Report Status PENDING  Incomplete    Pertinent Lab.    Latest Ref Rng & Units 06/30/2022    3:52 AM 06/29/2022    4:07 AM 06/28/2022    4:10 AM  CBC  WBC 4.0 - 10.5 K/uL 7.9  7.9  7.4   Hemoglobin 13.0 - 17.0 g/dL 91.4  78.2  95.6   Hematocrit 39.0 - 52.0 % 32.0  31.5  32.9   Platelets 150 - 400 K/uL 191  184  172       Latest Ref Rng & Units 06/30/2022    3:52 AM 06/28/2022    4:10 AM 06/27/2022    4:25 AM  CMP  Glucose 70 - 99 mg/dL 95  91  213   BUN 8 - 23 mg/dL 42  40  42   Creatinine 0.61 - 1.24 mg/dL 0.86  5.78  4.69   Sodium 135 - 145 mmol/L 132  133  133   Potassium 3.5 - 5.1 mmol/L 4.1  4.6  4.5   Chloride 98 - 111 mmol/L 102  99  102   CO2 22 - 32 mmol/L 24  26  23    Calcium 8.9 - 10.3 mg/dL 7.9  8.3  8.2      Pertinent Imaging today Plain films and CT images have been personally visualized and interpreted; radiology reports have been reviewed. Decision making incorporated into the Impression  No results found.   I have personally spent 38  minute involved in face-to-face and non-face-to-face activities for this patient on the day of the visit. Professional time  spent includes the following activities: Preparing to see the patient (review of tests), Obtaining and/or reviewing separately obtained history (admission/discharge record), Performing a medically appropriate examination and/or evaluation , Ordering medications/tests/procedures, referring and communicating with other health care professionals, Documenting clinical information in the EMR, Independently interpreting results (not separately reported), Communicating results to the patient/family/caregiver, Counseling and educating the patient/family/caregiver and Care coordination (not separately reported).   Plan d/w requesting provider as well as ID pharm D  Note: This document was prepared using dragon voice recognition software and may include unintentional dictation errors.   Electronically signed by:   Odette Fraction, MD Infectious Disease Physician [facility] for Infectious Disease Pager: [phone]

## 2022-06-30 NOTE — Progress Notes (Signed)
PTAR called at 4p. Report called to [facility] at [facility]. Still awaiting transportation.

## 2022-06-30 NOTE — TOC Transition Note (Addendum)
Transition of Care [facility]) - CM/SW Discharge Note   Patient Details  Name: Tyler Williams MRN: [id number] Date of Birth: 06-Oct-1926  Transition of Care [facility]) CM/SW Contact:  Otelia Santee, LCSW Phone Number: 06/30/2022, 12:51 PM   Clinical Narrative:    Spoke with pt's daughter to discuss bed offers for SNF. Pt's son and daughter have accepted bed offer for [facility]. Pt is able to transfer to their facility today.  Pt will be going to room 507. RN to call report [phone]. DNR and scripts placed in discharge packet at RN station.  PTAR called at 4:07pm for transportation.   Final next level of care: Skilled Nursing Facility Barriers to Discharge: Barriers Resolved   Patient Goals and CMS Choice CMS Medicare.gov Compare Post Acute Care list provided to:: Patient Represenative (must comment) Choice offered to / list presented to : Adult Children  Discharge Placement PASRR number recieved: 06/29/22 PASRR number recieved: 06/29/22            Patient chooses bed at: [facility] Patient to be transferred to facility by: PTAR Name of family member notified: [name] and Annice Pih Patient and family notified of of transfer: 06/30/22  Discharge Plan and Services Additional resources added to the After Visit Summary for   In-house Referral: Clinical Social Work Discharge Planning Services: NA Post Acute Care Choice: Resumption of Svcs/PTA Provider          DME Arranged: N/A DME Agency: NA                  Social Determinants of Health (SDOH) Interventions SDOH Screenings   Food Insecurity: No Food Insecurity (06/24/2022)  Housing: Low Risk  (06/24/2022)  Transportation Needs: No Transportation Needs (06/24/2022)  Utilities: Not At Risk (06/24/2022)  Depression (PHQ2-9): Low Risk  (12/06/2018)  Tobacco Use: Low Risk  (06/24/2022)     Readmission Risk Interventions    06/30/2022   12:49 PM 06/29/2022    2:04 PM 06/26/2022    3:39 PM  Readmission  Risk Prevention Plan  Transportation Screening Complete Complete Complete  Medication Review [employer])  Complete   PCP or Specialist appointment within 3-5 days of discharge  Complete Complete  HRI or Home Care Consult  Complete Complete  SW Recovery Care/Counseling Consult   Complete  Palliative Care Screening   Not Applicable  Skilled Nursing Facility   Not Applicable

## 2022-06-30 NOTE — Discharge Summary (Signed)
Physician Discharge Summary  Tyler Williams NWG:[id number] DOB: 1927/01/23 DOA: 06/23/2022  PCP: Geoffry Paradise, MD  Admit date: 06/23/2022 Discharge date: 06/30/2022 30 Day Unplanned Readmission Risk Score    Flowsheet Row ED to Hosp-Admission (Current) from 06/23/2022 in [facility]  30 Day Unplanned Readmission Risk Score (%) 37.42 Filed at 06/30/2022 1200       This score is the patient's risk of an unplanned readmission within 30 days of being discharged (0 -100%). The score is based on dignosis, age, lab data, medications, orders, and past utilization.   Low:  0-14.9   Medium: 15-21.9   High: 22-29.9   Extreme: 30 and above          Admitted From: ALF Disposition: SNF  Recommendations for Outpatient Follow-up:  Follow up with PCP in 1-2 weeks Please obtain BMP/CBC in one week Follow-up with ID in 3 to 4 weeks, they will arrange Please follow up with your PCP on the following pending results: Unresulted Labs (From admission, onward)     Start     Ordered   06/30/22 0906  C-reactive protein  Add-on,   AD       Question:  Specimen collection method  Answer:  Lab=Lab collect   06/30/22 0905   06/30/22 0500  Basic metabolic panel  Daily,   R     Question:  Specimen collection method  Answer:  Lab=Lab collect   06/29/22 1134   06/28/22 0500  Hepatitis B core antibody, total  Tomorrow morning,   R       Question:  Specimen collection method  Answer:  Lab=Lab collect   06/27/22 1551   06/28/22 0500  Hepatitis B surface antigen  Tomorrow morning,   R       Question:  Specimen collection method  Answer:  Lab=Lab collect   06/27/22 1551   06/24/22 0500  Protime-INR  Daily,   R      06/24/22 0038              Home Health: None Equipment/Devices: None  Discharge Condition: Stable CODE STATUS: DNR Diet recommendation: Cardiac  Subjective: Seen and examined.  No complaints.  Brief/Interim Summary: Tyler Williams is a 89 y.o. male with past medical history significant for chronic systolic CHF, afib, h/o TAVR, HTN, and stage 3a CKD presented from ALF with a fall. R arm skin tear with worsening erythema and fever. WBC 15.1. CT R forearm done. Diagnosed with RUE cellulitis, treated with Rocephin, Vanc. Wound care consulted. Noted to have olecranon bursitis of the R elbow; discussed with orthopedics (Dr. Aundria Rud) who recommended antibiotics and elevation, but no surgical intervention needed at this time. Infectious disease consulted.  Details below.   Right arm cellulitis/streptococcal bacteremia  TTE with no evidence of valvular vegetation.   MRI right forearm with mild edema olecranon process and proximal ulnar with mild enhancement concerning for early osteomyelitis, skin thickening/subcu soft tissue edema posterior aspect proximal forearm with small partially imaged fluid collection concerning for small abscess, small elbow joint effusion.  Reevaluated by orthopedics, Dr. Orlan Leavens on 5/15, no surgical intervention required at this time and to continue supportive care, IV antibiotics.  Leukocytosis resolved.  Patient received linezolid x 72 hours per ID.  Wound care RUE, wash with skin cleanser, pat dry, cover with Xeroform gauze top with ABD pad and secure with Kerlix, change daily.  Patient is on cefazolin and ID recommends 6 weeks cefazolin course from 06/25/2022,  EOT 08/05/2022.  ID will follow-up with him in 3 to 4 weeks.  ID did not recommend TEE.   Olecranon bursitis of right elbow EDP discussed case with Dr. Aundria Rud with orthopedics, who recommended antibiotics and elevation.  After positive blood cultures, formal consultation done.  Orthopedic on-call discussed with hand surgery on-call and added wound care orders as well for cellulitis as above.  MR right elbow 5/14 with peripherally enhancing fluid collection posterior aspect elbow joint measuring 1.8 x 3.2 x 5.5 cm with peripherally enhancing fluid  collection posterior aspect elbow joint measuring 1.8 x 3.2 x 5.5 cm consistent with olecranon bursitis however due to skin irregularity and surrounding subcu soft tissue edema abscess cannot be excluded, small elbow joint effusion, mildly increased signal proximal ulnar could be reactive versus early osteomyelitis cannot be excluded.  Reengagement with orthopedics 5/15, in which PA-C discussed with Dr. Orlan Leavens who reviewed the MRIs with no indication for surgery at this point with recommendation of a posterior arm splint for comfort, wound care and IV antibiotics per ID.   Acute on chronic systolic CHF (congestive heart failure) (HCC) Repeat echo during this hospitalization she notes improvement from previous echo 6 months ago.  Now with ejection fraction of 45 to 50% with global hypokinesis and indeterminate diastolic function. BNP elevated on 5/12 at 1136.  Patient started on IV Lasix.  He is normally on 60 mg p.o. daily as needed.  Have given IV albumin x 2 doses. Continue Imdur, Toprol XL, Entresto, spironolactone   CKD stage 3a, GFR 45-59 ml/min (HCC) -- stable at this time, Cr 1.23   Permanent atrial fibrillation  Supratherapeutic INR: resolved INR supratherapeutic on admission at 4.1, likely secondary to acute CHF causing hepatic congestion causing decreased Coumadin clearance.  INR level now within normal limits, Coumadin resumed. -- Rate controlled with metoprolol succinate 12.5 mg p.o. daily -- Continue Coumadin   Essential hypertension -- Metoprolol succinate 12.5 mg p.o. daily -- Entresto 24-26 mg p.o. twice daily -- Spironolactone 25 mg p.o. daily   Anxiety disorder -- Continue Ativan   BPH (benign prostatic hyperplasia) -- Continue Toviaz, finasteride, tamsulosin   Pressure injury of skin R forearm skin tear Pressure Injury 06/24/22 Buttocks Right;Left Stage 1 -  Intact skin with non-blanchable redness of a localized area usually over a bony prominence. redness (Active)   06/24/22 0354  Location: Buttocks  Location Orientation: Right;Left  Staging: Stage 1 -  Intact skin with non-blanchable redness of a localized area usually over a bony prominence.  Wound Description (Comments): redness  Present on Admission: Yes  -- Seen by wound RN -- Wound care to bilateral lower extremities, wash and pat gently dry, cover open areas with full layers of Xeroform gauze top with dry gauze, secure with Kerlix applied from toes to knee with paper tape, Prevalon boots -- Wound care RUE, wash with skin cleanser, pat dry, cover with Xeroform gauze top with ABD pad and secure with Kerlix, change daily   Constipation -- Continue home lactulose, Miralax   Macular degeneration Significantly visually impaired --Supportive care   Dyslipidemia -- Continue atorvastatin 20 mg p.o. daily   Overweight: Meets criteria with BMI greater than 30   Discharge plan was discussed with patient and/or family member and they verbalized understanding and agreed with it.  Discharge Diagnoses:  Principal Problem:   Right arm cellulitis Active Problems:   Olecranon bursitis of right elbow   Essential hypertension   Permanent atrial fibrillation (HCC)   CKD stage  3a, GFR 45-59 ml/min (HCC)   Chronic systolic CHF (congestive heart failure) (HCC)   DNR (do not resuscitate)/DNI(Do Not Intubate)   Anxiety disorder   BPH (benign prostatic hyperplasia)   HYPERCHOLESTEROLEMIA  IIA   Macular degeneration   Constipation   Streptococcal bacteremia   Pressure injury of skin    Discharge Instructions  Discharge Instructions     Home infusion instructions   Complete by: As directed    Instructions: Flushing of vascular access device: 0.9% NaCl pre/post medication administration and prn patency; Heparin 100 u/ml, 5ml for implanted ports and Heparin 10u/ml, 5ml for all other central venous catheters.      Allergies as of 06/30/2022   No Known Allergies      Medication List     TAKE  these medications    acetaminophen 500 MG tablet Commonly known as: TYLENOL Take 500 mg by mouth every 4 (four) hours as needed for moderate pain.   atorvastatin 20 MG tablet Commonly known as: LIPITOR TAKE 1 TABLET ONCE DAILY.   CAL-MAG-ZINC PO Take 1 tablet by mouth every morning.   ceFAZolin  IVPB Commonly known as: ANCEF Inject 2 g into the vein every 8 (eight) hours. Indication:  GAS septic arthritis/osteo First Dose: Yes Last Day of Therapy:  08/05/22 Labs - Once weekly:  CBC/D and BMP,  ESR and CRP Method of administration: IV Push Pull PICC line at the completion of IV antibiotics Method of administration may be changed at the discretion of home infusion pharmacist based upon assessment of the patient and/or caregiver's ability to self-administer the medication ordered.   Entresto 24-26 MG Generic drug: sacubitril-valsartan Take 1 tablet by mouth 2 (two) times daily. Please keep upcoming appointment for future refills thank you.   fesoterodine 8 MG Tb24 tablet Commonly known as: TOVIAZ Take 8 mg by mouth daily.   finasteride 5 MG tablet Commonly known as: PROSCAR Take 1 tablet (5 mg total) by mouth daily.   furosemide 40 MG tablet Commonly known as: LASIX Take 40 mg by mouth daily as needed for fluid or edema. If Weight Increases By 3lbs In A Day What changed: Another medication with the same name was removed. Continue taking this medication, and follow the directions you see here.   furosemide 40 MG tablet Commonly known as: LASIX Take 1.5 tablets (60 mg total) by mouth daily. What changed: Another medication with the same name was removed. Continue taking this medication, and follow the directions you see here.   isosorbide mononitrate 30 MG 24 hr tablet Commonly known as: IMDUR Take 1 tablet (30 mg total) by mouth daily.   lactulose 10 GM/15ML solution Commonly known as: CHRONULAC Take 30 mLs (20 g total) by mouth daily.   LORazepam 1 MG tablet Commonly  known as: ATIVAN Take 1 tablet (1 mg total) by mouth 2 (two) times daily as needed for anxiety.   metoprolol succinate 25 MG 24 hr tablet Commonly known as: Toprol XL Take 0.5 tablets (12.5 mg total) by mouth daily.   nitroGLYCERIN 0.4 MG SL tablet Commonly known as: NITROSTAT Place 0.4 mg under the tongue every 5 (five) minutes as needed for chest pain.   nystatin cream Commonly known as: MYCOSTATIN Apply 1 Application topically at bedtime.   oxyCODONE 5 MG immediate release tablet Commonly known as: Oxy IR/ROXICODONE Take 1 tablet (5 mg total) by mouth every 6 (six) hours as needed for severe pain.   polyethylene glycol 17 g packet Commonly known as: MIRALAX /  GLYCOLAX Take 17 g by mouth daily as needed. What changed: reasons to take this   Polyethylene Glycol 3350 Powd Take 17 g by mouth every other day.   PreserVision AREDS Tabs Take 1 tablet by mouth daily.   spironolactone 25 MG tablet Commonly known as: ALDACTONE Take 1 tablet (25 mg total) by mouth daily.   tamsulosin 0.4 MG Caps capsule Commonly known as: FLOMAX Take 1 capsule (0.4 mg total) by mouth daily.   Vitamin D-3 125 MCG (5000 UT) Tabs Take 5,000 Units by mouth every morning.   warfarin 2.5 MG tablet Commonly known as: COUMADIN Take 2.5 mg by mouth daily.               Home Infusion Instuctions  (From admission, onward)           Start     Ordered   06/30/22 0000  Home infusion instructions       Question:  Instructions  Answer:  Flushing of vascular access device: 0.9% NaCl pre/post medication administration and prn patency; Heparin 100 u/ml, 5ml for implanted ports and Heparin 10u/ml, 5ml for all other central venous catheters.   06/30/22 1225            Follow-up Information     Geoffry Paradise, MD Follow up in 1 week(s).   Specialty: Internal Medicine Contact information: [address] [place] [address] [phone]                No Known  Allergies  Consultations: Orthopedics and ID   Procedures/Studies: [place] EKG SITE RITE  Result Date: 06/30/2022 If Site Rite image not attached, placement could not be confirmed due to current cardiac rhythm.  MR ELBOW RIGHT W WO CONTRAST  Result Date: 06/27/2022 CLINICAL DATA:  Joint effusion.  Elbow cellulitis/bursitis. EXAM: MRI OF THE RIGHT ELBOW WITHOUT AND WITH CONTRAST TECHNIQUE: Multiplanar, multisequence MR imaging of the elbow was performed before and after the administration of intravenous contrast. CONTRAST:  6mL GADAVIST GADOBUTROL 1 MMOL/ML IV SOLN COMPARISON:  None Available. FINDINGS: TENDONS Common forearm flexor origin: Intact Common forearm extensor origin: Intact Biceps: Intact Triceps: Intact LIGAMENTS Medial stabilizers: Intact Lateral stabilizers:  Intact Cartilage: No chondral defect. Joint: Small elbow joint effusion. Cubital tunnel: Normal. Bones: No fracture or dislocation. Mildly increased signal of the proximal ulna, which is likely reactive secondary to adjacent inflammatory process. Soft Tissues: There is a peripherally enhancing fluid collection about the posterior aspect of the elbow joint which measures at least 1.8 x 3.2 x 5.5 cm. This may represent olecranon bursitis, however due to skin irregularity and surrounding subcutaneous soft tissue edema an abscess can not be excluded, clinical correlation is suggested. IMPRESSION: 1. There is a peripherally enhancing fluid collection about the posterior aspect of the elbow joint which measures at least 1.8 x 3.2 x 5.5 cm. This may represent olecranon bursitis, however due to skin irregularity and surrounding subcutaneous soft tissue edema an abscess can not be excluded, clinical correlation is suggested. 2. Small elbow joint effusion. 3. Mildly increased signal of the proximal ulna, which could be reactive secondary to adjacent bursitis/inflammatory changes, however if there is adjacent skin wound, early osteomyelitis can not be  excluded. Electronically Signed   By: Larose Hires D.O.   On: 06/27/2022 23:54   MR FOREARM RIGHT W WO CONTRAST  Result Date: 06/27/2022 CLINICAL DATA:  Osteomyelitis suspected.  Open wound. EXAM: MRI OF THE RIGHT FOREARM WITHOUT AND WITH CONTRAST TECHNIQUE: Multiplanar, multisequence MR imaging  of the right forearm was performed before and after the administration of intravenous contrast. CONTRAST:  6mL GADAVIST GADOBUTROL 1 MMOL/ML IV SOLN COMPARISON:  None Available. FINDINGS: Bones/Joint/Cartilage There is mild edema about the olecranon process and proximal ulna with enhancement on post contrast sequences concerning for early osteomyelitis. Marrow signal within the radius and distal ulna is within normal limits. There is small elbow joint effusion. Ligaments Intact. Muscles and Tendons There is generalized muscle atrophy of the flexor and extensor muscles of the forearm. No evidence of intramuscular fluid collection or abscess. Tendons of the flexor and extensor compartment appear intact. Soft tissues Skin thickening and subcutaneous soft tissue edema about the posterior aspect of the proximal forearm. There is enhancement of the soft tissues with a small partially imaged fluid collection concerning for small abscess/fluid collection. IMPRESSION: 1. Mild edema about the olecranon process and proximal ulna with enhancement on post contrast sequences concerning for early osteomyelitis. 2. Skin thickening and subcutaneous soft tissue edema about the posterior aspect of the proximal forearm with small partially imaged fluid collection concerning for small abscess/fluid collection. 3. Small elbow joint effusion. Electronically Signed   By: Larose Hires D.O.   On: 06/27/2022 23:40   ECHOCARDIOGRAM COMPLETE  Result Date: 06/25/2022    ECHOCARDIOGRAM REPORT   Patient Name:   KAIZER THOME Date of Exam: 06/25/2022 Medical Rec #:  [id number]             Height: Accession #:    [id number]            Weight:  Date of Birth:  07-29-1926              BSA: Patient Age:    89 years              BP:           118/59 mmHg Patient Gender: M                     HR:           69 bpm. Exam Location:  Inpatient Procedure: 2D Echo, Cardiac Doppler and Color Doppler Indications:    Bacteremia  History:        Patient has prior history of Echocardiogram examinations, most                 recent 12/23/2021. CHF, Arrythmias:Atrial Fibrillation; Risk                 Factors:Hypertension.                 Aortic Valve: valve is present in the aortic position.  Sonographer:    Lucy Antigua Referring Phys: [id number] JENNIFER YATES IMPRESSIONS  1. Left ventricular ejection fraction, by estimation, is 45 to 50%. The left ventricle has mildly decreased function. The left ventricle demonstrates global hypokinesis. Left ventricular diastolic parameters are indeterminate.  2. Right ventricular systolic function is moderately reduced. The right ventricular size is moderately enlarged. There is mildly elevated pulmonary artery systolic pressure.  3. Left atrial size was severely dilated.  4. Right atrial size was severely dilated.  5. Large pleural effusion.  6. Mild mitral valve regurgitation.  7. S/p 29 mm Sapien prosthetic (TAVR) valve. 06/13/2016. V max 1.5 m/s. mean gradient 5 mmhg. well seated. Normal prothesis. Aortic valve regurgitation is not visualized. There is a valve present in the aortic position.  8. The inferior vena cava is dilated in size with <50%  respiratory variability, suggesting right atrial pressure of 15 mmHg. Comparison(s): No significant change from prior study. FINDINGS  Left Ventricle: Left ventricular ejection fraction, by estimation, is 45 to 50%. The left ventricle has mildly decreased function. The left ventricle demonstrates global hypokinesis. The left ventricular internal cavity size was normal in size. There is  no left ventricular hypertrophy. Left ventricular diastolic parameters are indeterminate. Right Ventricle: The  right ventricular size is moderately enlarged. Right ventricular systolic function is moderately reduced. There is mildly elevated pulmonary artery systolic pressure. The tricuspid regurgitant velocity is 2.71 m/s, and with an assumed right atrial pressure of 15 mmHg, the estimated right ventricular systolic pressure is 44.4 mmHg. Left Atrium: Left atrial size was severely dilated. Right Atrium: Right atrial size was severely dilated. Pericardium: Trivial pericardial effusion is present. Mitral Valve: Mild mitral valve regurgitation. Tricuspid Valve: Tricuspid valve regurgitation is mild. Aortic Valve: S/p 29 mm Sapien prosthetic (TAVR) valve. 06/13/2016. V max 1.5 m/s. mean gradient 5 mmhg. well seated. Normal prothesis. Aortic valve regurgitation is not visualized. Aortic valve mean gradient measures 5.0 mmHg. Aortic valve peak gradient measures 9.9 mmHg. Aortic valve area, by VTI measures 2.04 cm. There is a valve present in the aortic position. Pulmonic Valve: Pulmonic valve regurgitation is not visualized. Venous: The inferior vena cava is dilated in size with less than 50% respiratory variability, suggesting right atrial pressure of 15 mmHg. IAS/Shunts: No atrial level shunt detected by color flow Doppler. Additional Comments: There is a large pleural effusion.  LEFT VENTRICLE PLAX 2D LVIDd:         5.10 cm     Diastology LVIDs:         3.60 cm     LV e' medial:    3.81 cm/s LV PW:         1.10 cm     LV E/e' medial:  39.4 LV IVS:        0.90 cm     LV e' lateral:   5.98 cm/s LVOT diam:     2.10 cm     LV E/e' lateral: 25.1 LV SV:         69 LVOT Area:     3.46 cm  LV Volumes (MOD) LV vol d, MOD A4C: 91.2 ml LV vol s, MOD A4C: 44.0 ml LV SV MOD A4C:     91.2 ml RIGHT VENTRICLE            IVC RV S prime:     6.53 cm/s  IVC diam: 3.00 cm TAPSE (M-mode): 1.1 cm LEFT ATRIUM              RIGHT ATRIUM LA Vol (A2C):   169.0 ml RA Area:     25.70 cm LA Vol (A4C):   137.0 ml RA Volume:   78.40 ml LA Biplane Vol: 154.0  ml  AORTIC VALVE AV Area (Vmax):    2.16 cm AV Area (Vmean):   1.86 cm AV Area (VTI):     2.04 cm AV Vmax:           157.00 cm/s AV Vmean:          110.000 cm/s AV VTI:            0.338 m AV Peak Grad:      9.9 mmHg AV Mean Grad:      5.0 mmHg LVOT Vmax:         97.90 cm/s LVOT Vmean:  59.100 cm/s LVOT VTI:          0.199 m LVOT/AV VTI ratio: 0.59  AORTA Ao Root diam: 2.60 cm MITRAL VALVE                  TRICUSPID VALVE MV Area (PHT): 4.57 cm       TR Peak grad:   29.4 mmHg MV Decel Time: 166 msec       TR Vmax:        271.00 cm/s MR Peak grad:    104.0 mmHg MR Mean grad:    71.0 mmHg    SHUNTS MR Vmax:         510.00 cm/s  Systemic VTI:  0.20 m MR Vmean:        399.0 cm/s   Systemic Diam: 2.10 cm MR PISA:         1.57 cm MR PISA Eff ROA: 11 mm MR PISA Radius:  0.50 cm MV E velocity: 150.00 cm/s MV A velocity: 46.70 cm/s MV E/A ratio:  3.21 Photographer signed by Carolan Clines Signature Date/Time: 06/25/2022/1:06:35 PM    Final    CT FOREARM RIGHT W CONTRAST  Result Date: 06/23/2022 CLINICAL DATA:  Soft tissue mass, deep wrist with redness up to mid humerus. Four day history of right arm pain after fall with laceration to skin. EXAM: CT OF THE UPPER RIGHT EXTREMITY WITH CONTRAST TECHNIQUE: Multidetector CT imaging of the upper right extremity was performed according to the standard protocol following intravenous contrast administration. RADIATION DOSE REDUCTION: This exam was performed according to the departmental dose-optimization program which includes automated exposure control, adjustment of the mA and/or kV according to patient size and/or use of iterative reconstruction technique. CONTRAST:  OMNIPAQUE IOHEXOL 300 MG/ML  SOLN COMPARISON:  None Available. FINDINGS: Bones/Joint/Cartilage Osteopenia is noted. No acute fracture or dislocation is seen. Degenerative changes are present at the elbow and wrist. There is no joint effusion at the elbow Ligaments Suboptimally assessed by  CT. Muscles and Tendons Within normal limits. Soft tissues Vascular calcifications are noted in the soft tissues. There is diffuse subcutaneous edema in the right forearm. There is a circumscribed hypodense collection posterior to the olecranon measuring 4.7 x 1.8 cm, suggesting distended bursa. No rim enhancing fluid collection or mass is seen. No subcutaneous emphysema. IMPRESSION: 1. No acute fracture or dislocation. 2. Extensive subcutaneous edema.  No abscess is seen. 3. Distention of the olecranon bursa, suggesting bursitis. No mass is identified. 4. Degenerative changes at the wrist and elbow. Electronically Signed   By: Thornell Sartorius M.D.   On: 06/23/2022 21:33   DG Forearm Right  Result Date: 06/23/2022 CLINICAL DATA:  Four day history of right arm pain after fall and laceration to the skin EXAM: RIGHT FOREARM - 2 VIEW; RIGHT WRIST - COMPLETE 4 VIEW COMPARISON:  None Available. FINDINGS: There is no evidence of fracture or other focal bone lesions. Soft tissues are unremarkable. Diffuse degenerative changes of the partially imaged hand and elbow. IMPRESSION: No acute displaced fracture or dislocation. No radiopaque foreign bodies. Electronically Signed   By: Agustin Cree M.D.   On: 06/23/2022 17:54   DG Wrist Complete Right  Result Date: 06/23/2022 CLINICAL DATA:  Four day history of right arm pain after fall and laceration to the skin EXAM: RIGHT FOREARM - 2 VIEW; RIGHT WRIST - COMPLETE 4 VIEW COMPARISON:  None Available. FINDINGS: There is no evidence of fracture or other focal bone lesions. Soft  tissues are unremarkable. Diffuse degenerative changes of the partially imaged hand and elbow. IMPRESSION: No acute displaced fracture or dislocation. No radiopaque foreign bodies. Electronically Signed   By: Agustin Cree M.D.   On: 06/23/2022 17:54     Discharge Exam: Vitals:   06/30/22 0530 06/30/22 1033  BP: (!) 116/55 (!) 123/56  Pulse: (!) 53 (!) 56  Resp: 17   Temp: 98.6 F (37 C)   SpO2: 93%     Vitals:   06/29/22 1935 06/30/22 0500 06/30/22 0530 06/30/22 1033  BP: (!) 116/59  (!) 116/55 (!) 123/56  Pulse: (!) 58  (!) 53 (!) 56  Resp: 18  17   Temp: 97.8 F (36.6 C)  98.6 F (37 C)   TempSrc: Oral  Oral   SpO2: 98%  93%   Weight:  58.5 kg    Height:        General: Pt is alert, awake, not in acute distress Cardiovascular: RRR, S1/S2 +, no rubs, no gallops Respiratory: CTA bilaterally, no wheezing, no rhonchi Abdominal: Soft, NT, ND, bowel sounds + Extremities: Dressing in right upper extremity.    The results of significant diagnostics from this hospitalization (including imaging, microbiology, ancillary and laboratory) are listed below for reference.     Microbiology: Recent Results (from the past 240 hour(s))  Culture, blood (Routine X 2) w Reflex to ID Panel     Status: Abnormal   Collection Time: 06/23/22  5:34 PM   Specimen: BLOOD  Result Value Ref Range Status   Specimen Description   Final    BLOOD RIGHT ANTECUBITAL Performed at [facility], [address] [place], [place] [address]    Special Requests   Final    BOTTLES DRAWN AEROBIC AND ANAEROBIC Blood Culture results may not be optimal due to an inadequate volume of blood received in culture bottles Performed at [facility], [address], [place], [place] [address]    Culture  Setup Time   Final    GRAM POSITIVE COCCI IN CHAINS IN BOTH AEROBIC AND ANAEROBIC BOTTLES CRITICAL RESULT CALLED TO, READ BACK BY AND VERIFIED WITH: PHARMD CRYSTAL R. [id number] FCP    Culture (A)  Final    GROUP A STREP (S.PYOGENES) ISOLATED HEALTH DEPARTMENT NOTIFIED Performed at [facility], [address] [place], [place] [address]    Report Status 06/26/2022 FINAL  Final   Organism ID, Bacteria GROUP A STREP (S.PYOGENES) ISOLATED  Final      Susceptibility   Group a strep (s.pyogenes) isolated - MIC*    PENICILLIN <=0.06 SENSITIVE Sensitive     CEFTRIAXONE  <=0.12 SENSITIVE Sensitive     ERYTHROMYCIN >=8 RESISTANT Resistant     LEVOFLOXACIN 0.5 SENSITIVE Sensitive     VANCOMYCIN 0.25 SENSITIVE Sensitive     * GROUP A STREP (S.PYOGENES) ISOLATED  Culture, blood (Routine X 2) w Reflex to ID Panel     Status: Abnormal   Collection Time: 06/23/22  5:34 PM   Specimen: BLOOD LEFT FOREARM  Result Value Ref Range Status   Specimen Description   Final    BLOOD LEFT FOREARM Performed at [facility], [address], [place], [place] [address]    Special Requests   Final    BOTTLES DRAWN AEROBIC AND ANAEROBIC Blood Culture adequate volume Performed at [facility], [address] [place], [place] [address]    Culture  Setup Time   Final  GRAM POSITIVE COCCI IN CHAINS IN BOTH AEROBIC AND ANAEROBIC BOTTLES    Culture (A)  Final    GROUP A STREP (S.PYOGENES) ISOLATED SUSCEPTIBILITIES PERFORMED ON PREVIOUS CULTURE WITHIN THE LAST 5 DAYS. HEALTH DEPARTMENT NOTIFIED Performed at [facility], [address] [place], [place] [address]    Report Status 06/26/2022 FINAL  Final  Blood Culture ID Panel (Reflexed)     Status: Abnormal   Collection Time: 06/23/22  5:34 PM  Result Value Ref Range Status   Enterococcus faecalis NOT DETECTED NOT DETECTED Final   Enterococcus Faecium NOT DETECTED NOT DETECTED Final   Listeria monocytogenes NOT DETECTED NOT DETECTED Final   Staphylococcus species NOT DETECTED NOT DETECTED Final   Staphylococcus aureus (BCID) NOT DETECTED NOT DETECTED Final   Staphylococcus epidermidis NOT DETECTED NOT DETECTED Final   Staphylococcus lugdunensis NOT DETECTED NOT DETECTED Final   Streptococcus species DETECTED (A) NOT DETECTED Final    Comment: CRITICAL RESULT CALLED TO, READ BACK BY AND VERIFIED WITH: PHARMD CRYSTAL R. [id number] FCP    Streptococcus agalactiae NOT DETECTED NOT DETECTED Final   Streptococcus pneumoniae NOT DETECTED NOT DETECTED Final   Streptococcus pyogenes DETECTED  (A) NOT DETECTED Final    Comment: CRITICAL RESULT CALLED TO, READ BACK BY AND VERIFIED WITH: PHARMD CRYSTAL R. [id number] FCP    A.calcoaceticus-baumannii NOT DETECTED NOT DETECTED Final   Bacteroides fragilis NOT DETECTED NOT DETECTED Final   Enterobacterales NOT DETECTED NOT DETECTED Final   Enterobacter cloacae complex NOT DETECTED NOT DETECTED Final   Escherichia coli NOT DETECTED NOT DETECTED Final   Klebsiella aerogenes NOT DETECTED NOT DETECTED Final   Klebsiella oxytoca NOT DETECTED NOT DETECTED Final   Klebsiella pneumoniae NOT DETECTED NOT DETECTED Final   Proteus species NOT DETECTED NOT DETECTED Final   Salmonella species NOT DETECTED NOT DETECTED Final   Serratia marcescens NOT DETECTED NOT DETECTED Final   Haemophilus influenzae NOT DETECTED NOT DETECTED Final   Neisseria meningitidis NOT DETECTED NOT DETECTED Final   Pseudomonas aeruginosa NOT DETECTED NOT DETECTED Final   Stenotrophomonas maltophilia NOT DETECTED NOT DETECTED Final   Candida albicans NOT DETECTED NOT DETECTED Final   Candida auris NOT DETECTED NOT DETECTED Final   Candida glabrata NOT DETECTED NOT DETECTED Final   Candida krusei NOT DETECTED NOT DETECTED Final   Candida parapsilosis NOT DETECTED NOT DETECTED Final   Candida tropicalis NOT DETECTED NOT DETECTED Final   Cryptococcus neoformans/gattii NOT DETECTED NOT DETECTED Final    Comment: Performed at [facility], [address] [place], [place] [address]  MRSA Next Gen by PCR, Nasal     Status: Abnormal   Collection Time: 06/24/22 12:22 PM   Specimen: Nasal Mucosa; Nasal Swab  Result Value Ref Range Status   MRSA by PCR Next Gen DETECTED (A) NOT DETECTED Final    Comment: (NOTE) The GeneXpert MRSA Assay (FDA approved for NASAL specimens only), is one component of a comprehensive MRSA colonization surveillance program. It is not intended to diagnose MRSA infection nor to guide or monitor treatment for MRSA infections. Test performance  is not FDA approved in patients less than 68 years old. Performed at [facility], [address], [place], [place] [address]   Culture, blood (Routine X 2) w Reflex to ID Panel     Status: None   Collection Time: 06/25/22 10:13 AM   Specimen: BLOOD LEFT ARM  Result Value Ref Range Status   Specimen Description  Final    BLOOD LEFT ARM Performed at [facility], [address] [place], [place] [address]    Special Requests   Final    BOTTLES DRAWN AEROBIC AND ANAEROBIC Blood Culture adequate volume Performed at [facility], [address], [place], [place] [address]    Culture   Final    NO GROWTH 5 DAYS Performed at [facility], [address], [place], [place] [address]    Report Status 06/30/2022 FINAL  Final  Culture, blood (Routine X 2) w Reflex to ID Panel     Status: None   Collection Time: 06/25/22 10:18 AM   Specimen: BLOOD LEFT HAND  Result Value Ref Range Status   Specimen Description   Final    BLOOD LEFT HAND Performed at [facility], [address], [place], [place] [address]    Special Requests   Final    AEROBIC BOTTLE ONLY Blood Culture adequate volume Performed at [facility], [address], [place], [place] [address]    Culture   Final    NO GROWTH 5 DAYS Performed at [facility], [address] [place], [place] [address]    Report Status 06/30/2022 FINAL  Final     Labs: BNP (last 3 results) Recent Labs    06/25/22 1242 06/28/22 0410 06/30/22 0352  BNP 1,136.2* 1,228.0* 1,207.1*   Basic Metabolic Panel: Recent Labs  Lab 06/24/22 0412 06/25/22 0355 06/26/22 0357 06/27/22 0425 06/28/22 0410 06/30/22 0352  NA 135 133* 133* 133* 133* 132*  K 4.3 4.5 4.3 4.5 4.6 4.1  CL 101 102 102 102 99 102  CO2 25 21* 21* 23 26 24   GLUCOSE 122* 116* 93 102* 91 95  BUN 37* 41* 42* 42* 40* 42*  CREATININE 1.43* 1.24 1.14 1.12 1.23 1.23   CALCIUM 8.5* 8.5* 8.1* 8.2* 8.3* 7.9*  MG 2.4  --   --   --   --   --    Liver Function Tests: Recent Labs  Lab 06/24/22 0412  AST 17  ALT 15  ALKPHOS 105  BILITOT 1.3*  PROT 6.8  ALBUMIN 2.4*   No results for input(s): "LIPASE", "AMYLASE" in the last 168 hours. No results for input(s): "AMMONIA" in the last 168 hours. CBC: Recent Labs  Lab 06/23/22 1734 06/24/22 0412 06/25/22 0355 06/26/22 0357 06/27/22 0425 06/28/22 0410 06/29/22 0407 06/30/22 0352  WBC 15.1* 20.5* 16.0* 12.0* 8.7 7.4 7.9 7.9  NEUTROABS 13.5* 18.4* 14.7*  --   --   --   --   --   HGB 11.1* 10.9* 10.4* 10.2* 10.0* 10.6* 10.1* 10.1*  HCT 35.4* 34.7* 32.9* 33.3* 32.0* 32.9* 31.5* 32.0*  MCV 94.9 95.9 95.1 97.4 94.4 94.8 94.3 94.4  PLT 186 167 159 172 175 172 184 191   Cardiac Enzymes: No results for input(s): "CKTOTAL", "CKMB", "CKMBINDEX", "TROPONINI" in the last 168 hours. BNP: Invalid input(s): "POCBNP" CBG: No results for input(s): "GLUCAP" in the last 168 hours. D-Dimer No results for input(s): "DDIMER" in the last 72 hours. Hgb A1c No results for input(s): "HGBA1C" in the last 72 hours. Lipid Profile No results for input(s): "CHOL", "HDL", "LDLCALC", "TRIG", "CHOLHDL", "LDLDIRECT" in the last 72 hours. Thyroid function studies No results for input(s): "TSH", "T4TOTAL", "T3FREE", "THYROIDAB" in the last 72 hours.  Invalid input(s): "FREET3" Anemia work up No results for input(s): "VITAMINB12", "FOLATE", "FERRITIN", "TIBC", "IRON", "RETICCTPCT" in the last 72 hours.  Urinalysis    Component Value Date/Time   COLORURINE YELLOW 04/10/2022 2011   APPEARANCEUR CLEAR 04/10/2022 2011   APPEARANCEUR Clear 10/05/2021 1516   LABSPEC 1.020 04/10/2022 2011   PHURINE 5.0 04/10/2022 2011   GLUCOSEU NEGATIVE 04/10/2022 2011   HGBUR LARGE (A) 04/10/2022 2011   BILIRUBINUR NEGATIVE 04/10/2022 2011   BILIRUBINUR Negative 10/05/2021 1516   KETONESUR NEGATIVE 04/10/2022 2011   PROTEINUR NEGATIVE  04/10/2022 2011   UROBILINOGEN 0.2 09/27/2013 2341   NITRITE NEGATIVE 04/10/2022 2011   LEUKOCYTESUR NEGATIVE 04/10/2022 2011   Sepsis Labs Recent Labs  Lab 06/27/22 0425 06/28/22 0410 06/29/22 0407 06/30/22 0352  WBC 8.7 7.4 7.9 7.9   Microbiology Recent Results (from the past 240 hour(s))  Culture, blood (Routine X 2) w Reflex to ID Panel     Status: Abnormal   Collection Time: 06/23/22  5:34 PM   Specimen: BLOOD  Result Value Ref Range Status   Specimen Description   Final    BLOOD RIGHT ANTECUBITAL Performed at [facility], [address] [place], [place] [address]    Special Requests   Final    BOTTLES DRAWN AEROBIC AND ANAEROBIC Blood Culture results may not be optimal due to an inadequate volume of blood received in culture bottles Performed at [facility], [address] [place], [place] [address]    Culture  Setup Time   Final    GRAM POSITIVE COCCI IN CHAINS IN BOTH AEROBIC AND ANAEROBIC BOTTLES CRITICAL RESULT CALLED TO, READ BACK BY AND VERIFIED WITH: PHARMD CRYSTAL R. [id number] FCP    Culture (A)  Final    GROUP A STREP (S.PYOGENES) ISOLATED HEALTH DEPARTMENT NOTIFIED Performed at [facility], [address] [place], [place] [address]    Report Status 06/26/2022 FINAL  Final   Organism ID, Bacteria GROUP A STREP (S.PYOGENES) ISOLATED  Final      Susceptibility   Group a strep (s.pyogenes) isolated - MIC*    PENICILLIN <=0.06 SENSITIVE Sensitive     CEFTRIAXONE <=0.12 SENSITIVE Sensitive     ERYTHROMYCIN >=8 RESISTANT Resistant     LEVOFLOXACIN 0.5 SENSITIVE Sensitive     VANCOMYCIN 0.25 SENSITIVE Sensitive     * GROUP A STREP (S.PYOGENES) ISOLATED  Culture, blood (Routine X 2) w Reflex to ID Panel     Status: Abnormal   Collection Time: 06/23/22  5:34 PM   Specimen: BLOOD LEFT FOREARM  Result Value Ref Range Status   Specimen Description   Final    BLOOD LEFT FOREARM Performed at [facility], [address], [place], [place] [address]    Special Requests   Final    BOTTLES DRAWN AEROBIC AND ANAEROBIC Blood Culture adequate volume Performed at [facility], [address], [place], [place] [address]    Culture  Setup Time   Final    GRAM POSITIVE COCCI IN CHAINS IN BOTH AEROBIC AND ANAEROBIC BOTTLES    Culture (A)  Final    GROUP A STREP (S.PYOGENES) ISOLATED SUSCEPTIBILITIES PERFORMED ON PREVIOUS CULTURE WITHIN THE LAST 5 DAYS. HEALTH DEPARTMENT NOTIFIED Performed at [facility], [address], [place], [place] [address]    Report Status 06/26/2022 FINAL  Final  Blood Culture ID Panel (Reflexed)     Status: Abnormal   Collection Time: 06/23/22  5:34 PM  Result Value Ref Range Status   Enterococcus faecalis NOT DETECTED NOT DETECTED Final   Enterococcus Faecium NOT  DETECTED NOT DETECTED Final   Listeria monocytogenes NOT DETECTED NOT DETECTED Final   Staphylococcus species NOT DETECTED NOT DETECTED Final   Staphylococcus aureus (BCID) NOT DETECTED NOT DETECTED Final   Staphylococcus epidermidis NOT DETECTED NOT DETECTED Final   Staphylococcus lugdunensis NOT DETECTED NOT DETECTED Final   Streptococcus species DETECTED (A) NOT DETECTED Final    Comment: CRITICAL RESULT CALLED TO, READ BACK BY AND VERIFIED WITH: PHARMD CRYSTAL R. [id number] FCP    Streptococcus agalactiae NOT DETECTED NOT DETECTED Final   Streptococcus pneumoniae NOT DETECTED NOT DETECTED Final   Streptococcus pyogenes DETECTED (A) NOT DETECTED Final    Comment: CRITICAL RESULT CALLED TO, READ BACK BY AND VERIFIED WITH: PHARMD CRYSTAL R. [id number] FCP    A.calcoaceticus-baumannii NOT DETECTED NOT DETECTED Final   Bacteroides fragilis NOT DETECTED NOT DETECTED Final   Enterobacterales NOT DETECTED NOT DETECTED Final   Enterobacter cloacae complex NOT DETECTED NOT DETECTED Final   Escherichia coli NOT DETECTED NOT DETECTED Final   Klebsiella aerogenes NOT DETECTED NOT  DETECTED Final   Klebsiella oxytoca NOT DETECTED NOT DETECTED Final   Klebsiella pneumoniae NOT DETECTED NOT DETECTED Final   Proteus species NOT DETECTED NOT DETECTED Final   Salmonella species NOT DETECTED NOT DETECTED Final   Serratia marcescens NOT DETECTED NOT DETECTED Final   Haemophilus influenzae NOT DETECTED NOT DETECTED Final   Neisseria meningitidis NOT DETECTED NOT DETECTED Final   Pseudomonas aeruginosa NOT DETECTED NOT DETECTED Final   Stenotrophomonas maltophilia NOT DETECTED NOT DETECTED Final   Candida albicans NOT DETECTED NOT DETECTED Final   Candida auris NOT DETECTED NOT DETECTED Final   Candida glabrata NOT DETECTED NOT DETECTED Final   Candida krusei NOT DETECTED NOT DETECTED Final   Candida parapsilosis NOT DETECTED NOT DETECTED Final   Candida tropicalis NOT DETECTED NOT DETECTED Final   Cryptococcus neoformans/gattii NOT DETECTED NOT DETECTED Final    Comment: Performed at [facility], [address] [place], [place] [address]  MRSA Next Gen by PCR, Nasal     Status: Abnormal   Collection Time: 06/24/22 12:22 PM   Specimen: Nasal Mucosa; Nasal Swab  Result Value Ref Range Status   MRSA by PCR Next Gen DETECTED (A) NOT DETECTED Final    Comment: (NOTE) The GeneXpert MRSA Assay (FDA approved for NASAL specimens only), is one component of a comprehensive MRSA colonization surveillance program. It is not intended to diagnose MRSA infection nor to guide or monitor treatment for MRSA infections. Test performance is not FDA approved in patients less than 56 years old. Performed at [facility], [address], [place], [place] [address]   Culture, blood (Routine X 2) w Reflex to ID Panel     Status: None   Collection Time: 06/25/22 10:13 AM   Specimen: BLOOD LEFT ARM  Result Value Ref Range Status   Specimen Description   Final    BLOOD LEFT ARM Performed at [facility], [address], [place], [place]  [address]    Special Requests   Final    BOTTLES DRAWN AEROBIC AND ANAEROBIC Blood Culture adequate volume Performed at [facility], [address] [place], [place] [address]    Culture   Final    NO GROWTH 5 DAYS Performed at [facility], [address] [place], [place] [address]    Report Status 06/30/2022 FINAL  Final  Culture, blood (Routine X 2) w Reflex to ID Panel  Status: None   Collection Time: 06/25/22 10:18 AM   Specimen: BLOOD LEFT HAND  Result Value Ref Range Status   Specimen Description   Final    BLOOD LEFT HAND Performed at [facility], [address] [place], [place] [address]    Special Requests   Final    AEROBIC BOTTLE ONLY Blood Culture adequate volume Performed at [facility], [address] [place], [place] [address]    Culture   Final    NO GROWTH 5 DAYS Performed at [facility], [address] [place], [place] [address]    Report Status 06/30/2022 FINAL  Final     Time coordinating discharge: Over 30 minutes  SIGNED:   Hughie Closs, MD  [facility] 06/30/2022, 12:25 PM *Please note that this is a verbal dictation therefore any spelling or grammatical errors are due to the "Dragon Medical One" system interpretation. If 7PM-7AM, please contact night-coverage [email or web address]

## 2022-07-03 DIAGNOSIS — L03119 Cellulitis of unspecified part of limb: Secondary | ICD-10-CM | POA: Diagnosis not present

## 2022-07-03 DIAGNOSIS — M6281 Muscle weakness (generalized): Secondary | ICD-10-CM | POA: Diagnosis not present

## 2022-07-03 DIAGNOSIS — I5023 Acute on chronic systolic (congestive) heart failure: Secondary | ICD-10-CM | POA: Diagnosis not present

## 2022-07-03 NOTE — Consult Note (Signed)
   [facility] CM Inpatient Consult   07/03/2022  Tyler Williams Jun 15, 1926 [id number]  [facility] [[facility]]  Accountable Care OrganizatCH  Primary Care Provider:  Geoffry Paradise, MD with [facility]   Patient was reviewed for extreme high risk score and length of stay and any barriers to care when returning to the community.   Patient transitioned  to a [facility] affiliated facility and can be followed by [facility] [employer] [[facility]] Care Management PAC RN with traditional Medicare and approved Medicare Advantage plans.  Reviewed for post facility needs and plan it to return to [facility] ALF per inpatient [facility] team progress notes.  Plan:   Will notify [facility] PAC RN of any transitional care needs for returning to post facility for [facility] community care coordination needs to return to community.  For questions or referrals, please contact:   Charlesetta Shanks, RN BSN CCM [facility]  [facility]  [phone] business mobile phone Toll free office [phone]  *Concierge Line  [phone] Fax number: [phone] [email or web address]

## 2022-07-04 ENCOUNTER — Telehealth: Payer: Self-pay

## 2022-07-04 DIAGNOSIS — R1915 Other abnormal bowel sounds: Secondary | ICD-10-CM | POA: Diagnosis not present

## 2022-07-04 NOTE — Telephone Encounter (Signed)
Patient's son Abdulahad Lafary called office with questions regarding recent hospital visit. Would like to review Hepatitis labs from 5/15 and would like to know why patient is being seen by office. Informed him that provider is managing IV antibiotics and is why he is scheduled for follow up.  Informed him that Hepatitis labs were negative.  Would like some more information about reason for antibiotics. Is requesting call from MD.  Provider notified. Juanita Laster, RMA

## 2022-07-04 NOTE — Telephone Encounter (Signed)
Left vm with Durward Mallard, at [facility] to confirm appointment for 5/28. Juanita Laster, RMA

## 2022-07-06 DIAGNOSIS — L988 Other specified disorders of the skin and subcutaneous tissue: Secondary | ICD-10-CM | POA: Diagnosis not present

## 2022-07-10 DIAGNOSIS — M7021 Olecranon bursitis, right elbow: Secondary | ICD-10-CM | POA: Diagnosis not present

## 2022-07-10 DIAGNOSIS — I4891 Unspecified atrial fibrillation: Secondary | ICD-10-CM | POA: Diagnosis not present

## 2022-07-10 DIAGNOSIS — I5023 Acute on chronic systolic (congestive) heart failure: Secondary | ICD-10-CM | POA: Diagnosis not present

## 2022-07-10 DIAGNOSIS — L03119 Cellulitis of unspecified part of limb: Secondary | ICD-10-CM | POA: Diagnosis not present

## 2022-07-11 ENCOUNTER — Inpatient Hospital Stay: Payer: Medicare Other | Admitting: Infectious Diseases

## 2022-07-13 DIAGNOSIS — R2681 Unsteadiness on feet: Secondary | ICD-10-CM | POA: Diagnosis not present

## 2022-07-13 DIAGNOSIS — I480 Paroxysmal atrial fibrillation: Secondary | ICD-10-CM | POA: Diagnosis not present

## 2022-07-13 DIAGNOSIS — M7021 Olecranon bursitis, right elbow: Secondary | ICD-10-CM | POA: Diagnosis not present

## 2022-07-13 DIAGNOSIS — I5032 Chronic diastolic (congestive) heart failure: Secondary | ICD-10-CM | POA: Diagnosis not present

## 2022-07-13 DIAGNOSIS — L03113 Cellulitis of right upper limb: Secondary | ICD-10-CM | POA: Diagnosis not present

## 2022-07-13 DIAGNOSIS — M6281 Muscle weakness (generalized): Secondary | ICD-10-CM | POA: Diagnosis not present

## 2022-07-14 DIAGNOSIS — I5023 Acute on chronic systolic (congestive) heart failure: Secondary | ICD-10-CM | POA: Diagnosis not present

## 2022-07-14 DIAGNOSIS — I2510 Atherosclerotic heart disease of native coronary artery without angina pectoris: Secondary | ICD-10-CM | POA: Diagnosis not present

## 2022-07-14 DIAGNOSIS — L03119 Cellulitis of unspecified part of limb: Secondary | ICD-10-CM | POA: Diagnosis not present

## 2022-07-14 DIAGNOSIS — I4891 Unspecified atrial fibrillation: Secondary | ICD-10-CM | POA: Diagnosis not present

## 2022-07-17 ENCOUNTER — Ambulatory Visit: Payer: Medicare Other | Admitting: Cardiovascular Disease

## 2022-07-17 DIAGNOSIS — M6281 Muscle weakness (generalized): Secondary | ICD-10-CM | POA: Diagnosis not present

## 2022-07-17 DIAGNOSIS — M7021 Olecranon bursitis, right elbow: Secondary | ICD-10-CM | POA: Diagnosis not present

## 2022-07-17 DIAGNOSIS — I5032 Chronic diastolic (congestive) heart failure: Secondary | ICD-10-CM | POA: Diagnosis not present

## 2022-07-17 DIAGNOSIS — I4891 Unspecified atrial fibrillation: Secondary | ICD-10-CM | POA: Diagnosis not present

## 2022-07-17 DIAGNOSIS — L03119 Cellulitis of unspecified part of limb: Secondary | ICD-10-CM | POA: Diagnosis not present

## 2022-07-17 DIAGNOSIS — I5023 Acute on chronic systolic (congestive) heart failure: Secondary | ICD-10-CM | POA: Diagnosis not present

## 2022-07-17 DIAGNOSIS — I480 Paroxysmal atrial fibrillation: Secondary | ICD-10-CM | POA: Diagnosis not present

## 2022-07-17 DIAGNOSIS — L03113 Cellulitis of right upper limb: Secondary | ICD-10-CM | POA: Diagnosis not present

## 2022-07-17 DIAGNOSIS — R2681 Unsteadiness on feet: Secondary | ICD-10-CM | POA: Diagnosis not present

## 2022-07-18 ENCOUNTER — Inpatient Hospital Stay: Payer: Medicare Other | Admitting: Infectious Diseases

## 2022-07-19 ENCOUNTER — Other Ambulatory Visit: Payer: Self-pay | Admitting: *Deleted

## 2022-07-19 NOTE — Patient Outreach (Signed)
[facility] Post- Acute Care Coordinator follow up. Tyler Williams resides in [facility] skilled nursing facility.  Screening for potential [facility] care coordination services as a benefit of health plan and primary care provider.  Facility site visit to [employer]. Met with Tyler Williams, Child psychotherapist. Tyler Williams will return to ALF post skilled stay.  Currently on IV antibiotics until 08/05/22.  No identifiable [facility] care coordination needs at this time.   Raiford Noble, MSN, RN,BSN [facility] Post Acute Care Coordinator [phone] (Direct dial)

## 2022-07-20 ENCOUNTER — Telehealth (INDEPENDENT_AMBULATORY_CARE_PROVIDER_SITE_OTHER): Payer: Medicare Other | Admitting: Infectious Diseases

## 2022-07-20 DIAGNOSIS — B950 Streptococcus, group A, as the cause of diseases classified elsewhere: Secondary | ICD-10-CM

## 2022-07-20 DIAGNOSIS — M7021 Olecranon bursitis, right elbow: Secondary | ICD-10-CM | POA: Diagnosis not present

## 2022-07-20 DIAGNOSIS — R7881 Bacteremia: Secondary | ICD-10-CM | POA: Diagnosis not present

## 2022-07-20 DIAGNOSIS — Z79899 Other long term (current) drug therapy: Secondary | ICD-10-CM

## 2022-07-20 DIAGNOSIS — M86131 Other acute osteomyelitis, right radius and ulna: Secondary | ICD-10-CM

## 2022-07-20 DIAGNOSIS — L03113 Cellulitis of right upper limb: Secondary | ICD-10-CM

## 2022-07-20 DIAGNOSIS — M869 Osteomyelitis, unspecified: Secondary | ICD-10-CM | POA: Insufficient documentation

## 2022-07-20 DIAGNOSIS — Z452 Encounter for adjustment and management of vascular access device: Secondary | ICD-10-CM

## 2022-07-20 DIAGNOSIS — I480 Paroxysmal atrial fibrillation: Secondary | ICD-10-CM | POA: Diagnosis not present

## 2022-07-20 DIAGNOSIS — R2681 Unsteadiness on feet: Secondary | ICD-10-CM | POA: Diagnosis not present

## 2022-07-20 DIAGNOSIS — M6281 Muscle weakness (generalized): Secondary | ICD-10-CM | POA: Diagnosis not present

## 2022-07-20 DIAGNOSIS — I5032 Chronic diastolic (congestive) heart failure: Secondary | ICD-10-CM | POA: Diagnosis not present

## 2022-07-20 NOTE — Progress Notes (Signed)
Virtual Visit via Video Note  I connected withNAME@ on 07/20/22 at  8:45 AM EDT by a video enabled telemedicine application and verified that I am speaking with the correct person using two identifiers.  Location: Patient: SNF  Provider: [facility]   I discussed the limitations of evaluation and management by telemedicine and the availability of in person appointments. The patient expressed understanding and agreed to proceed.  [facility]  Patient Active Problem List   Diagnosis Date Noted   Streptococcal bacteremia 06/24/2022   Pressure injury of skin 06/24/2022   Right arm cellulitis 06/23/2022   Olecranon bursitis of right elbow 06/23/2022   DNR (do not resuscitate)/DNI(Do Not Intubate) 06/23/2022   Chronic systolic CHF (congestive heart failure) (HCC) 04/10/2022   Normocytic anemia 11/21/2021   CKD stage 3a, GFR 45-59 ml/min (HCC) 11/21/2021   Thrombocytopenia (HCC) 11/21/2021   BPH (benign prostatic hyperplasia) 11/21/2021   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) 10/05/2021   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   Pain of left hand 08/12/2020   Abrasion of unspecified back wall of thorax, initial encounter 07/23/2020   Fall 07/23/2020   Hearing loss 07/23/2020   Laceration without foreign body of right elbow, initial encounter 07/23/2020   Gross hematuria    DCM (dilated cardiomyopathy) (HCC)    Chronic atrial fibrillation (HCC)    Congestive heart failure (HCC) 06/12/2020   Dyspnea 04/24/2019   Chronic obstructive pulmonary disease (HCC) 06/10/2018   Hardening of the aorta (main artery of the heart) (HCC) 06/10/2018   Hypertensive heart and chronic kidney disease with heart failure and stage 1 through stage 4 chronic kidney disease, or unspecified chronic kidney disease (HCC) 04/24/2018   Aorta aneurysm (HCC) 02/14/2018   Cirrhosis of liver (HCC) 02/08/2018   Constipation 01/15/2018   Acute on chronic combined systolic and diastolic CHF  (congestive heart failure) (HCC) 09/25/2017   Anticoagulated on Coumadin 08/28/2017   S/P TAVR (transcatheter aortic valve replacement)    Presence of prosthetic heart valve 09/06/2016   S/P TAVR (transcatheter aortic valve replacement) 06/13/2016   Incidental cecal mass noted on CT imaging 05/24/2016   Inguinal hernia 02/24/2016   Exudative age-related macular 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 (HCC) 12/22/2014   Non-thrombocytopenic purpura (HCC) 08/19/2014   Encounter for therapeutic drug monitoring 03/20/2013   Pseudophakia of both eyes 03/19/2013   Macular degeneration 03/19/2013   Hematuria, microscopic 12/03/2012   Anxiety disorder 06/20/2012   Long term (current) use of anticoagulants 06/20/2012   Cystoid macular edema 05/14/2012   Overweight (BMI 25.0-29.9) 01/23/2012   Hyponatremia 01/23/2012   S/P right UKR 01/22/2012   Carotid artery bruit 11/21/2011   Carotid bruit 11/21/2011   Presence of intraocular lens 02/16/2011   Osteoarthritis 07/05/2010   EDEMA 07/14/2009   Essential hypertension 01/27/2009   HYPERCHOLESTEROLEMIA  IIA 10/21/2008   CAD (coronary artery disease) 10/21/2008   Permanent atrial fibrillation (HCC) 10/21/2008    Patient's Medications  New Prescriptions   No medications on file  Previous Medications   ACETAMINOPHEN (TYLENOL) 500 MG TABLET    Take 500 mg by mouth every 4 (four) hours as needed for moderate pain.   ATORVASTATIN (LIPITOR) 20 MG TABLET    TAKE 1 TABLET ONCE DAILY.   CALCIUM-MAGNESIUM-ZINC (CAL-MAG-ZINC PO)    Take 1 tablet by mouth every morning.   CEFAZOLIN (ANCEF) IVPB    Inject 2 g into the vein every 8 (eight) hours. Indication:  GAS septic arthritis/osteo First  Dose: Yes Last Day of Therapy:  08/05/22 Labs - Once weekly:  CBC/D and BMP,  ESR and CRP Method of administration: IV Push Pull PICC line at the completion of IV antibiotics Method of administration may be changed at the discretion of  home infusion pharmacist based upon assessment of the patient and/or caregiver's ability to self-administer the medication ordered.   CHOLECALCIFEROL (VITAMIN D-3) 125 MCG (5000 UT) TABS    Take 5,000 Units by mouth every morning.   FESOTERODINE (TOVIAZ) 8 MG TB24 TABLET    Take 8 mg by mouth daily.   FINASTERIDE (PROSCAR) 5 MG TABLET    Take 1 tablet (5 mg total) by mouth daily.   FUROSEMIDE (LASIX) 40 MG TABLET    Take 1.5 tablets (60 mg total) by mouth daily.   FUROSEMIDE (LASIX) 40 MG TABLET    Take 40 mg by mouth daily as needed for fluid or edema. If Weight Increases By 3lbs In A Day   ISOSORBIDE MONONITRATE (IMDUR) 30 MG 24 HR TABLET    Take 1 tablet (30 mg total) by mouth daily.   LACTULOSE (CHRONULAC) 10 GM/15ML SOLUTION    Take 30 mLs (20 g total) by mouth daily.   LORAZEPAM (ATIVAN) 1 MG TABLET    Take 1 tablet (1 mg total) by mouth 2 (two) times daily as needed for anxiety.   METOPROLOL SUCCINATE (TOPROL XL) 25 MG 24 HR TABLET    Take 0.5 tablets (12.5 mg total) by mouth daily.   MULTIPLE VITAMINS-MINERALS (PRESERVISION AREDS) TABS    Take 1 tablet by mouth daily.   NITROGLYCERIN (NITROSTAT) 0.4 MG SL TABLET    Place 0.4 mg under the tongue every 5 (five) minutes as needed for chest pain.   NYSTATIN CREAM (MYCOSTATIN)    Apply 1 Application topically at bedtime.   OXYCODONE (OXY IR/ROXICODONE) 5 MG IMMEDIATE RELEASE TABLET    Take 1 tablet (5 mg total) by mouth every 6 (six) hours as needed for severe pain.   POLYETHYLENE GLYCOL (MIRALAX / GLYCOLAX) 17 G PACKET    Take 17 g by mouth daily as needed.   POLYETHYLENE GLYCOL 3350 POWD    Take 17 g by mouth every other day.   SACUBITRIL-VALSARTAN (ENTRESTO) 24-26 MG    Take 1 tablet by mouth 2 (two) times daily. Please keep upcoming appointment for future refills thank you.   SPIRONOLACTONE (ALDACTONE) 25 MG TABLET    Take 1 tablet (25 mg total) by mouth daily.   TAMSULOSIN (FLOMAX) 0.4 MG CAPS CAPSULE    Take 1 capsule (0.4 mg total) by  mouth daily.   WARFARIN (COUMADIN) 2.5 MG TABLET    Take 2.5 mg by mouth daily.  Modified Medications   No medications on file  Discontinued Medications   No medications on file    History of Present Illness: 89 Y O male with PMH as below including CHFrEF, CKD, HTN, severe aortic stenosis s/p TAVR, Liver cirrhosis, Bilateral venous stasis/Lower extremity swelling, Left knee replacement, rt UKR here for HFU for right elbow olecranon bursitis/possible osteomyelitis of proximal ulna.  Patient had missed 2 earlier appointments due to transportation issues from SNF to the clinic and hence, a video visit with me today.  Patient was discharged from hospital on 5/17 to complete 6 weeks of IV cefazolin through PICC, EOT 08/05/2022  Spoke to patient's son after technical issues with video visit  (I was able to see patient and son as well as hear them but they  were not able to hear my voice).  Son reports pain and swelling in the right elbow is improving, patient is able to flex and extend at the right level without any difficulty.  Denies any concerns with the PICC right pain tenderness or swelling.  Denies any issues with antibiotics and nausea vomiting, diarrhea or rashes.  or  ROS -denies fever, chills and sweats Denies nausea, vomiting or diarrhea Denies any chest pain, cough and shortness of breath  Past Medical History:  Diagnosis Date   AAA (abdominal aortic aneurysm) (HCC)    s/p repair   Acute on chronic systolic (congestive) heart failure (HCC) 12/22/2021   Aortic atherosclerosis (HCC)    Arthritis    knees   CAD (coronary artery disease)    a. s/p POBA 1980s, 1990s (details unclear). b. known stenotic RCA (previous unsuccessful intervention)   Cancer (HCC)    skin cancer removed, right hand, left leg, chest   Carotid artery disease (HCC)    Cholecystitis, acute 12/06/2012   Chronic anticoagulation    Coumadin   Chronic kidney disease, stage 3a (HCC)    Cirrhosis of liver (HCC)     by imaging   Coronary artery disease    GERD (gastroesophageal reflux disease)    Heart failure with mildly reduced ejection fraction (HFmrEF) (HCC)    History of pulmonary embolism    1964 related to dislocated hip on right    History of skin cancer    Hypercholesteremia    Hypertension    Incidental cecal mass noted on CT imaging 05/24/2016   **An incidental finding of potential clinical significance has been found. 4.6 x 5.6 x 3.7 cm masslike lesion in the cecum adjacent to the ileocecal valve may represent a large polyp or colonic neoplasm. Correlation with nonemergent colonoscopy is strongly recommended in the near future to better evaluate this finding.**   Macular degeneration    eye injections   Microscopic hematuria    Followed by urology   Permanent atrial fibrillation [place])    Pulmonary hypertension (HCC)    PVD (peripheral vascular disease) (HCC)    Bilateral renal artery atherosclerosis, SMA stenosis with collateralization   RBBB    Chronic   Rib fracture 04/11/2022   S/P TAVR (transcatheter aortic valve replacement) 06/13/2016   29 mm Edwards Sapien 3 transcatheter heart valve placed via percutaneous right transfemoral approach   Severe aortic stenosis    s/p TAVR   SSS (sick sinus syndrome) (HCC)    Past Surgical History:  Procedure Laterality Date   ABDOMINAL AORTIC ANEURYSM REPAIR     1993    CARDIAC CATHETERIZATION  05/24/2012   pD1 20%, oCFX 20%, mCFX 30%, ostial Int Br 30%, distal AV groove CFX 30%, pRCA 30-40%, mRCA 99%, dRCA 30%, PL branch small with diffuse 40%. Unable to pass wire past RCA lesion->medically managed   CHOLECYSTECTOMY N/A 12/05/2012   Procedure: LAPAROSCOPIC CHOLECYSTECTOMY ;  Surgeon: Mariella Saa, MD;  Location: [facility] OR;  Service: General;  Laterality: N/A;   CHOLECYSTECTOMY     CORONARY ANGIOPLASTY  1980, 1990   CORONARY BALLOON ANGIOPLASTY N/A 08/31/2017   Procedure: CORONARY BALLOON ANGIOPLASTY;  Surgeon: Swaziland, Peter M, MD;   Location: [facility] INVASIVE CV LAB;  Service: Cardiovascular;  Laterality: N/A;   EYE SURGERY     hx of cataract surgery    HEMORRHOID SURGERY     1973   HERNIA REPAIR     right inguinal hernia repair    JOINT REPLACEMENT  left knee replacement, partial right   LEFT HEART CATH AND CORONARY ANGIOGRAPHY N/A 08/31/2017   Procedure: LEFT HEART CATH AND CORONARY ANGIOGRAPHY;  Surgeon: Swaziland, Peter M, MD;  Location: [facility] INVASIVE CV LAB;  Service: Cardiovascular;  Laterality: N/A;   LEFT HEART CATHETERIZATION WITH CORONARY ANGIOGRAM N/A 05/24/2012   Procedure: LEFT HEART CATHETERIZATION WITH CORONARY ANGIOGRAM;  Surgeon: Kathleene Hazel, MD;  Location: [facility] C[facility] LAB;  Service: Cardiovascular;  Laterality: N/A;   ORTHOPEDIC SURGERY     OTHER SURGICAL HISTORY     right knee popliteal aneurysm surgery stent placed    OTHER SURGICAL HISTORY     PARTIAL KNEE ARTHROPLASTY  01/22/2012   Procedure: UNICOMPARTMENTAL KNEE;  Surgeon: [name], MD;  Location: [facility] ORS;  Service: Orthopedics;  Laterality: Right;   PERCUTANEOUS CORONARY INTERVENTION-BALLOON ONLY  05/24/2012   Procedure: PERCUTANEOUS CORONARY INTERVENTION-BALLOON ONLY;  Surgeon: Kathleene Hazel, MD;  Location: [facility] CATH LAB;  Service: Cardiovascular;;   RIGHT/LEFT HEART CATH AND CORONARY ANGIOGRAPHY N/A 05/12/2016   Procedure: Right/Left Heart Cath and Coronary Angiography;  Surgeon: Tonny Bollman, MD;  Location: [facility] INVASIVE CV LAB;  Service: Cardiovascular;  Laterality: N/A;   TEE WITHOUT CARDIOVERSION N/A 06/13/2016   Procedure: TRANSESOPHAGEAL ECHOCARDIOGRAM (TEE);  Surgeon: Tonny Bollman, MD;  Location: [facility] OR;  Service: Open Heart Surgery;  Laterality: N/A;   TONSILLECTOMY     TOTAL KNEE ARTHROPLASTY Left    TRANSCATHETER AORTIC VALVE REPLACEMENT, TRANSFEMORAL N/A 06/13/2016   Procedure: TRANSCATHETER AORTIC VALVE REPLACEMENT, TRANSFEMORAL;  Surgeon: Tonny Bollman, MD;  Location: [facility] OR;  Service: Open Heart Surgery;  Laterality:  N/A;    Social History   Tobacco Use   Smoking status: Never   Smokeless tobacco: Never  Vaping Use   Vaping Use: Never used  Substance Use Topics   Alcohol use: Not Currently    Comment: patient doesn't drink anymore   Drug use: Never    Family History  Problem Relation Age of Onset   Heart attack Mother 15    No Known Allergies  Health Maintenance  Topic Date Due   Zoster Vaccines- Shingrix (1 of 2) Never done   Pneumonia Vaccine 74+ Years old (1 of 1 - PCV) Never done   COVID-19 Vaccine (3 - Pfizer risk series) 04/21/2019   Medicare Annual Wellness (AWV)  09/27/2021   INFLUENZA VACCINE  09/14/2022   DTaP/Tdap/Td (2 - Td or Tdap) 04/28/2031   HPV VACCINES  Aged Out    Observations/Objective: Sitting in the chair and appears comfortable, son sitting beside him  Assessment and Plan: 34 Y O male with PMH as below including CHFrEF, CKD, HTN, severe aortic stenosis s/p TAVR, Liver cirrhosis, Bilateral venous stasis/Lower extremity swelling with   # Group A strep bacteremia 2/2 # Fall leading to rt forearm cellulitis and olecranon bursitis/abscess # 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 No surgical intervention recommended   Complete 6 weeks of IV cefazolin on 08/05/22 Will request labs from Follow-up in 2 weeks by to end date of antibiotic  # PICC No concerns, to be removed after last dose of IV antibiotics  # Medication monitoring Labs requested from SNF as above   Follow Up Instructions: 2 weeks  Please come 15 mins early for next appointment    I discussed the assessment and treatment plan with the patient. The patient was provided an opportunity  to ask questions and all were answered. The patient agreed with the plan and demonstrated an understanding of the instructions.   The patient was advised to call back or seek an  in-person evaluation if the symptoms worsen or if the condition fails to improve as anticipated.  I provided 41 minutes of non-face-to-face time during this encounter.  Victoriano Lain, MD [facility] [phone] pager   [phone] cell 07/20/2022, 8:35 AM

## 2022-07-24 ENCOUNTER — Telehealth: Payer: Self-pay

## 2022-07-24 DIAGNOSIS — D464 Refractory anemia, unspecified: Secondary | ICD-10-CM | POA: Diagnosis not present

## 2022-07-24 DIAGNOSIS — M7021 Olecranon bursitis, right elbow: Secondary | ICD-10-CM | POA: Diagnosis not present

## 2022-07-24 DIAGNOSIS — I480 Paroxysmal atrial fibrillation: Secondary | ICD-10-CM | POA: Diagnosis not present

## 2022-07-24 DIAGNOSIS — L03113 Cellulitis of right upper limb: Secondary | ICD-10-CM | POA: Diagnosis not present

## 2022-07-24 DIAGNOSIS — E559 Vitamin D deficiency, unspecified: Secondary | ICD-10-CM | POA: Diagnosis not present

## 2022-07-24 DIAGNOSIS — I5032 Chronic diastolic (congestive) heart failure: Secondary | ICD-10-CM | POA: Diagnosis not present

## 2022-07-24 DIAGNOSIS — I5023 Acute on chronic systolic (congestive) heart failure: Secondary | ICD-10-CM | POA: Diagnosis not present

## 2022-07-24 DIAGNOSIS — E0781 Sick-euthyroid syndrome: Secondary | ICD-10-CM | POA: Diagnosis not present

## 2022-07-24 DIAGNOSIS — R2681 Unsteadiness on feet: Secondary | ICD-10-CM | POA: Diagnosis not present

## 2022-07-24 DIAGNOSIS — M6281 Muscle weakness (generalized): Secondary | ICD-10-CM | POA: Diagnosis not present

## 2022-07-24 NOTE — Telephone Encounter (Signed)
Verbal orders given to [name], Charity fundraiser at [facility] - per Dr.Manandhar - Pull PICC line after last dose of IV abx on 08/05/2022. Patient will follow up with Dr.Manandhar on 08/15/2022.    Tyler Williams, CMA

## 2022-07-27 DIAGNOSIS — I5032 Chronic diastolic (congestive) heart failure: Secondary | ICD-10-CM | POA: Diagnosis not present

## 2022-07-27 DIAGNOSIS — L03113 Cellulitis of right upper limb: Secondary | ICD-10-CM | POA: Diagnosis not present

## 2022-07-27 DIAGNOSIS — M7021 Olecranon bursitis, right elbow: Secondary | ICD-10-CM | POA: Diagnosis not present

## 2022-07-27 DIAGNOSIS — I480 Paroxysmal atrial fibrillation: Secondary | ICD-10-CM | POA: Diagnosis not present

## 2022-07-27 DIAGNOSIS — R2681 Unsteadiness on feet: Secondary | ICD-10-CM | POA: Diagnosis not present

## 2022-07-27 DIAGNOSIS — M6281 Muscle weakness (generalized): Secondary | ICD-10-CM | POA: Diagnosis not present

## 2022-07-31 DIAGNOSIS — I480 Paroxysmal atrial fibrillation: Secondary | ICD-10-CM | POA: Diagnosis not present

## 2022-07-31 DIAGNOSIS — M6281 Muscle weakness (generalized): Secondary | ICD-10-CM | POA: Diagnosis not present

## 2022-07-31 DIAGNOSIS — L03113 Cellulitis of right upper limb: Secondary | ICD-10-CM | POA: Diagnosis not present

## 2022-07-31 DIAGNOSIS — I5032 Chronic diastolic (congestive) heart failure: Secondary | ICD-10-CM | POA: Diagnosis not present

## 2022-07-31 DIAGNOSIS — M7021 Olecranon bursitis, right elbow: Secondary | ICD-10-CM | POA: Diagnosis not present

## 2022-07-31 DIAGNOSIS — R2681 Unsteadiness on feet: Secondary | ICD-10-CM | POA: Diagnosis not present

## 2022-08-02 LAB — CULTURE, BLOOD (ROUTINE X 2)

## 2022-08-03 DIAGNOSIS — I5032 Chronic diastolic (congestive) heart failure: Secondary | ICD-10-CM | POA: Diagnosis not present

## 2022-08-03 DIAGNOSIS — L03113 Cellulitis of right upper limb: Secondary | ICD-10-CM | POA: Diagnosis not present

## 2022-08-03 DIAGNOSIS — M6281 Muscle weakness (generalized): Secondary | ICD-10-CM | POA: Diagnosis not present

## 2022-08-03 DIAGNOSIS — D464 Refractory anemia, unspecified: Secondary | ICD-10-CM | POA: Diagnosis not present

## 2022-08-03 DIAGNOSIS — I5023 Acute on chronic systolic (congestive) heart failure: Secondary | ICD-10-CM | POA: Diagnosis not present

## 2022-08-03 DIAGNOSIS — M7021 Olecranon bursitis, right elbow: Secondary | ICD-10-CM | POA: Diagnosis not present

## 2022-08-03 DIAGNOSIS — E0781 Sick-euthyroid syndrome: Secondary | ICD-10-CM | POA: Diagnosis not present

## 2022-08-03 DIAGNOSIS — E559 Vitamin D deficiency, unspecified: Secondary | ICD-10-CM | POA: Diagnosis not present

## 2022-08-03 DIAGNOSIS — I480 Paroxysmal atrial fibrillation: Secondary | ICD-10-CM | POA: Diagnosis not present

## 2022-08-03 DIAGNOSIS — R2681 Unsteadiness on feet: Secondary | ICD-10-CM | POA: Diagnosis not present

## 2022-08-07 DIAGNOSIS — L03113 Cellulitis of right upper limb: Secondary | ICD-10-CM | POA: Diagnosis not present

## 2022-08-07 DIAGNOSIS — R2681 Unsteadiness on feet: Secondary | ICD-10-CM | POA: Diagnosis not present

## 2022-08-07 DIAGNOSIS — E559 Vitamin D deficiency, unspecified: Secondary | ICD-10-CM | POA: Diagnosis not present

## 2022-08-07 DIAGNOSIS — M6281 Muscle weakness (generalized): Secondary | ICD-10-CM | POA: Diagnosis not present

## 2022-08-07 DIAGNOSIS — D464 Refractory anemia, unspecified: Secondary | ICD-10-CM | POA: Diagnosis not present

## 2022-08-07 DIAGNOSIS — I480 Paroxysmal atrial fibrillation: Secondary | ICD-10-CM | POA: Diagnosis not present

## 2022-08-07 DIAGNOSIS — M7021 Olecranon bursitis, right elbow: Secondary | ICD-10-CM | POA: Diagnosis not present

## 2022-08-07 DIAGNOSIS — I5023 Acute on chronic systolic (congestive) heart failure: Secondary | ICD-10-CM | POA: Diagnosis not present

## 2022-08-07 DIAGNOSIS — I5032 Chronic diastolic (congestive) heart failure: Secondary | ICD-10-CM | POA: Diagnosis not present

## 2022-08-07 DIAGNOSIS — E0781 Sick-euthyroid syndrome: Secondary | ICD-10-CM | POA: Diagnosis not present

## 2022-08-10 DIAGNOSIS — D464 Refractory anemia, unspecified: Secondary | ICD-10-CM | POA: Diagnosis not present

## 2022-08-10 DIAGNOSIS — I130 Hypertensive heart and chronic kidney disease with heart failure and stage 1 through stage 4 chronic kidney disease, or unspecified chronic kidney disease: Secondary | ICD-10-CM | POA: Diagnosis not present

## 2022-08-10 DIAGNOSIS — I495 Sick sinus syndrome: Secondary | ICD-10-CM | POA: Diagnosis not present

## 2022-08-10 DIAGNOSIS — I739 Peripheral vascular disease, unspecified: Secondary | ICD-10-CM | POA: Diagnosis not present

## 2022-08-10 DIAGNOSIS — Z9861 Coronary angioplasty status: Secondary | ICD-10-CM | POA: Diagnosis not present

## 2022-08-10 DIAGNOSIS — Z9181 History of falling: Secondary | ICD-10-CM | POA: Diagnosis not present

## 2022-08-10 DIAGNOSIS — Z556 Problems related to health literacy: Secondary | ICD-10-CM | POA: Diagnosis not present

## 2022-08-10 DIAGNOSIS — N39498 Other specified urinary incontinence: Secondary | ICD-10-CM | POA: Diagnosis not present

## 2022-08-10 DIAGNOSIS — Z95820 Peripheral vascular angioplasty status with implants and grafts: Secondary | ICD-10-CM | POA: Diagnosis not present

## 2022-08-10 DIAGNOSIS — H3530 Unspecified macular degeneration: Secondary | ICD-10-CM | POA: Diagnosis not present

## 2022-08-10 DIAGNOSIS — K219 Gastro-esophageal reflux disease without esophagitis: Secondary | ICD-10-CM | POA: Diagnosis not present

## 2022-08-10 DIAGNOSIS — I480 Paroxysmal atrial fibrillation: Secondary | ICD-10-CM | POA: Diagnosis not present

## 2022-08-10 DIAGNOSIS — Z86711 Personal history of pulmonary embolism: Secondary | ICD-10-CM | POA: Diagnosis not present

## 2022-08-10 DIAGNOSIS — I2510 Atherosclerotic heart disease of native coronary artery without angina pectoris: Secondary | ICD-10-CM | POA: Diagnosis not present

## 2022-08-10 DIAGNOSIS — M1990 Unspecified osteoarthritis, unspecified site: Secondary | ICD-10-CM | POA: Diagnosis not present

## 2022-08-10 DIAGNOSIS — N401 Enlarged prostate with lower urinary tract symptoms: Secondary | ICD-10-CM | POA: Diagnosis not present

## 2022-08-10 DIAGNOSIS — Z96652 Presence of left artificial knee joint: Secondary | ICD-10-CM | POA: Diagnosis not present

## 2022-08-10 DIAGNOSIS — I5022 Chronic systolic (congestive) heart failure: Secondary | ICD-10-CM | POA: Diagnosis not present

## 2022-08-10 DIAGNOSIS — E785 Hyperlipidemia, unspecified: Secondary | ICD-10-CM | POA: Diagnosis not present

## 2022-08-10 DIAGNOSIS — Z7901 Long term (current) use of anticoagulants: Secondary | ICD-10-CM | POA: Diagnosis not present

## 2022-08-10 DIAGNOSIS — E0781 Sick-euthyroid syndrome: Secondary | ICD-10-CM | POA: Diagnosis not present

## 2022-08-10 DIAGNOSIS — Z5181 Encounter for therapeutic drug level monitoring: Secondary | ICD-10-CM | POA: Diagnosis not present

## 2022-08-10 DIAGNOSIS — K7460 Unspecified cirrhosis of liver: Secondary | ICD-10-CM | POA: Diagnosis not present

## 2022-08-10 DIAGNOSIS — Z952 Presence of prosthetic heart valve: Secondary | ICD-10-CM | POA: Diagnosis not present

## 2022-08-10 DIAGNOSIS — N1831 Chronic kidney disease, stage 3a: Secondary | ICD-10-CM | POA: Diagnosis not present

## 2022-08-12 ENCOUNTER — Emergency Department (HOSPITAL_COMMUNITY)
Admission: EM | Admit: 2022-08-12 | Discharge: 2022-08-12 | Disposition: A | Discharge status: Home / Self Care | Payer: Medicare Other | Attending: Emergency Medicine | Admitting: Emergency Medicine

## 2022-08-12 ENCOUNTER — Encounter (HOSPITAL_COMMUNITY): Payer: Self-pay

## 2022-08-12 ENCOUNTER — Other Ambulatory Visit: Payer: Self-pay

## 2022-08-12 ENCOUNTER — Emergency Department (HOSPITAL_COMMUNITY): Payer: Medicare Other

## 2022-08-12 DIAGNOSIS — R55 Syncope and collapse: Secondary | ICD-10-CM | POA: Diagnosis not present

## 2022-08-12 DIAGNOSIS — S81812A Laceration without foreign body, left lower leg, initial encounter: Secondary | ICD-10-CM | POA: Insufficient documentation

## 2022-08-12 DIAGNOSIS — I10 Essential (primary) hypertension: Secondary | ICD-10-CM | POA: Diagnosis not present

## 2022-08-12 DIAGNOSIS — Y92002 Bathroom of unspecified non-institutional (private) residence single-family (private) house as the place of occurrence of the external cause: Secondary | ICD-10-CM | POA: Diagnosis not present

## 2022-08-12 DIAGNOSIS — S51812A Laceration without foreign body of left forearm, initial encounter: Secondary | ICD-10-CM | POA: Diagnosis not present

## 2022-08-12 DIAGNOSIS — W1839XA Other fall on same level, initial encounter: Secondary | ICD-10-CM | POA: Diagnosis not present

## 2022-08-12 DIAGNOSIS — W19XXXA Unspecified fall, initial encounter: Secondary | ICD-10-CM

## 2022-08-12 DIAGNOSIS — Z7401 Bed confinement status: Secondary | ICD-10-CM | POA: Diagnosis not present

## 2022-08-12 DIAGNOSIS — F0390 Unspecified dementia without behavioral disturbance: Secondary | ICD-10-CM | POA: Diagnosis not present

## 2022-08-12 DIAGNOSIS — G4489 Other headache syndrome: Secondary | ICD-10-CM | POA: Diagnosis not present

## 2022-08-12 DIAGNOSIS — S0990XA Unspecified injury of head, initial encounter: Secondary | ICD-10-CM | POA: Diagnosis not present

## 2022-08-12 DIAGNOSIS — Z7901 Long term (current) use of anticoagulants: Secondary | ICD-10-CM | POA: Insufficient documentation

## 2022-08-12 LAB — CBG MONITORING, ED: Glucose-Capillary: 81 mg/dL (ref 70–99)

## 2022-08-12 MED ORDER — LIDOCAINE-EPINEPHRINE (PF) 2 %-1:200000 IJ SOLN
10.0000 mL | Freq: Once | INTRAMUSCULAR | Status: AC
  Administered 2022-08-12: 10 mL
  Filled 2022-08-12: qty 20

## 2022-08-12 NOTE — ED Triage Notes (Signed)
Pt BIBEMS from ALF w cc of fall. Per staff at facility, pt was found on bathroom flood, no reported LOC as pt remained at baseline and pt does not believe that he hit his head. Pt is on coumadin. Upon arrival to ED, pt has no complaints of any pain small lac noted on left calf with small various skin tears on right elbow and left wrist. Pt a&ox4,vss,nad.

## 2022-08-12 NOTE — ED Notes (Signed)
Pt transported back to facility via ems. Pt belongings transported back with patient. Pt family at bedside confirmed pt address. Pt a&ox3,vss,nad.

## 2022-08-12 NOTE — ED Notes (Signed)
Ptar called, confirmed he's on the list, 4-5 ahead of him

## 2022-08-12 NOTE — Discharge Instructions (Addendum)
7 stitches placed in left leg laceration should be removed in 8-10 days.  Left wrist laceration is superficial, can apply bandaide to this.  CT imaging of head with no emergent injury.  Please follow up with facility provider.

## 2022-08-12 NOTE — ED Notes (Signed)
Report called to [facility], verbalized understanding of pt returning to facility.

## 2022-08-12 NOTE — ED Notes (Signed)
Per Renaye Rakers, MD pt to be a level 2 trauma due to fall on blood thinner. Marijean Niemann, charge made aware.

## 2022-08-12 NOTE — ED Provider Notes (Signed)
[place] [facility]GENC[facility] DEPARTMENT AT [facility] Provider Note   CSN: [id number] Arrival date & time: 08/12/22  7829     History  Chief Complaint  Patient presents with   Tyler Williams is a 89 y.o. male presenting from a memory care facility by EMS with concern for a fall and potential head injury.  The patient had an unwitnessed fall in his room.  The patient does have reported history of dementia, however he is able to provide a detailed account of what happened this morning, and reports that he was attempting to transition himself from his wheelchair to the commode, and slipped on his grip, tumbling to the ground.  He thinks he struck his head on the wheelchair or something else in the room.  He denies a headache.  He denies any hip pain or other injuries.  He does have a laceration to the left leg as well as left wrist that were bandaged by fire rescue.  History provided by EMS  Patient is on coumadin  HPI     Home Medications Prior to Admission medications   Medication Sig Start Date End Date Taking? Authorizing Provider  acetaminophen (TYLENOL) 500 MG tablet Take 500 mg by mouth every 4 (four) hours as needed for moderate pain.   Yes [provider]  atorvastatin (LIPITOR) 20 MG tablet TAKE 1 TABLET ONCE DAILY. Patient taking differently: Take 20 mg by mouth daily. 12/30/20  Yes Tonny Bollman, MD  Calcium-Magnesium-Zinc (CAL-MAG-ZINC PO) Take 1 tablet by mouth every morning.   Yes [provider]  fesoterodine (TOVIAZ) 8 MG TB24 tablet Take 8 mg by mouth daily.   Yes [provider]  finasteride (PROSCAR) 5 MG tablet Take 1 tablet (5 mg total) by mouth daily. 06/21/20  Yes Marinda Elk, MD  furosemide (LASIX) 40 MG tablet Take 1.5 tablets (60 mg total) by mouth daily. Patient taking differently: Take 60 mg by mouth daily. PRN order: Take 40 mg by mouth every day as needed for weight gain of 3 lbs in one day or 5 lbs in  one week 12/05/21  Yes Weaver, Scott T, PA-C  isosorbide mononitrate (IMDUR) 30 MG 24 hr tablet Take 1 tablet (30 mg total) by mouth daily. 04/14/22  Yes Zannie Cove, MD  lactulose (CHRONULAC) 10 GM/15ML solution Take 30 mLs (20 g total) by mouth daily. 04/14/22  Yes Zannie Cove, MD  LORazepam (ATIVAN) 1 MG tablet Take 1 tablet (1 mg total) by mouth 2 (two) times daily as needed for anxiety. 06/30/22  Yes Pahwani, Daleen Bo, MD  metoprolol succinate (TOPROL XL) 25 MG 24 hr tablet Take 0.5 tablets (12.5 mg total) by mouth daily. 10/05/21  Yes Weaver, Scott T, PA-C  Multiple Vitamins-Minerals (PRESERVISION AREDS) TABS Take 1 tablet by mouth daily. 03/04/09  Yes [provider]  nitroGLYCERIN (NITROSTAT) 0.4 MG SL tablet Place 0.4 mg under the tongue every 5 (five) minutes as needed for chest pain.   Yes [provider]  nystatin cream (MYCOSTATIN) Apply 1 Application topically at bedtime. Apply to sacral and genital area 11/28/21  Yes [provider]  polyethylene glycol (MIRALAX / GLYCOLAX) 17 g packet Take 17 g by mouth daily as needed. Patient taking differently: Take 17 g by mouth daily as needed for moderate constipation. 12/26/21  Yes Arnetha Courser, MD  Polyethylene Glycol 3350 POWD Take 17 g by mouth daily.   Yes [provider]  sacubitril-valsartan (ENTRESTO) 24-26 MG Take  1 tablet by mouth 2 (two) times daily. Please keep upcoming appointment for future refills thank you. 07/04/21  Yes Tonny Bollman, MD  spironolactone (ALDACTONE) 25 MG tablet Take 1 tablet (25 mg total) by mouth daily. 10/05/21  Yes Weaver, Scott T, PA-C  tamsulosin (FLOMAX) 0.4 MG CAPS capsule Take 1 capsule (0.4 mg total) by mouth daily. 06/21/20  Yes Marinda Elk, MD  warfarin (COUMADIN) 5 MG tablet Take 5 mg by mouth daily.   Yes [provider]  Cholecalciferol (VITAMIN D-3) 125 MCG (5000 UT) TABS Take 5,000 Units by mouth every morning. Patient not taking: Reported on  08/12/2022    [provider]  oxyCODONE (OXY IR/ROXICODONE) 5 MG immediate release tablet Take 1 tablet (5 mg total) by mouth every 6 (six) hours as needed for severe pain. Patient not taking: Reported on 08/12/2022 06/30/22   Hughie Closs, MD      Allergies    Patient has no known allergies.    Review of Systems   Review of Systems  Physical Exam Updated Vital Signs BP (!) 147/83 (BP Location: Right Arm)   Pulse 74   Temp 98.3 F (36.8 C) (Oral)   Resp 20   SpO2 99%  Physical Exam Constitutional:      General: He is not in acute distress. HENT:     Head: Normocephalic and atraumatic.  Eyes:     Conjunctiva/sclera: Conjunctivae normal.     Pupils: Pupils are equal, round, and reactive to light.  Neck:     Comments: No spinal midline tenderness Cardiovascular:     Rate and Rhythm: Normal rate and regular rhythm.  Pulmonary:     Effort: Pulmonary effort is normal. No respiratory distress.  Musculoskeletal:     Comments: Laceration to left lower leg, curvilinear, through skin, no active bleeding Superficial laceration to left forearm No tenderness with ROM testing of the hips, shoulders, no pelvic instability, no chest wall tenderness  Skin:    General: Skin is warm and dry.  Neurological:     General: No focal deficit present.     Mental Status: He is alert. Mental status is at baseline.     ED Results / Procedures / Treatments   Labs (all labs ordered are listed, but only abnormal results are displayed) Labs Reviewed  CBG MONITORING, ED    EKG EKG Interpretation Date/Time:  Saturday August 12 2022 09:17:41 EDT Ventricular Rate:  71 PR Interval:    QRS Duration:  159 QT Interval:  477 QTC Calculation: 519 R Axis:   118  Text Interpretation: Atrial fibrillation Right bundle branch block Confirmed by Alvester Chou [phone]) on 08/12/2022 9:32:53 AM  Radiology CT Head Wo Contrast  Result Date: 08/12/2022 CLINICAL DATA:  89 year old male found down,  status post fall. EXAM: CT HEAD WITHOUT CONTRAST TECHNIQUE: Contiguous axial images were obtained from the base of the skull through the vertex without intravenous contrast. RADIATION DOSE REDUCTION: This exam was performed according to the departmental dose-optimization program which includes automated exposure control, adjustment of the mA and/or kV according to patient size and/or use of iterative reconstruction technique. COMPARISON:  Head CT 04/10/2022. FINDINGS: Brain: Stable cerebral volume. No midline shift, mass effect, or evidence of intracranial mass lesion. No ventriculomegaly. Stable small chronic cerebellar infarcts. Stable gray-white differentiation elsewhere. No cortically based acute infarct identified. No acute intracranial hemorrhage identified. Vascular: Extensive Calcified atherosclerosis at the skull base. No suspicious intracranial vascular hyperdensity. Skull: No acute osseous abnormality identified. Sinuses/Orbits: Visualized  paranasal sinuses and mastoids are stable and well aerated. Other: Calcified scalp vessel atherosclerosis again noted. Postoperative changes to the globes. No acute orbit or scalp soft tissue finding identified. IMPRESSION: Stable since February. No acute intracranial abnormality or acute traumatic injury identified. Electronically Signed   By: Odessa Fleming M.D.   On: 08/12/2022 10:07    Procedures .Marland KitchenLaceration Repair  Date/Time: 08/12/2022 7:48 PM  Performed by: Terald Sleeper, MD Authorized by: Terald Sleeper, MD   Consent:    Consent obtained:  Verbal   Consent given by:  Patient   Risks discussed:  Infection, pain and poor cosmetic result Universal protocol:    Immediately prior to procedure, a time out was called: yes     Patient identity confirmed:  Arm band Laceration details:    Location:  Leg   Leg location:  L lower leg   Length (cm):  5 Pre-procedure details:    Preparation:  Patient was prepped and draped in usual sterile  fashion Exploration:    Imaging outcome: foreign body not noted     Wound exploration: wound explored through full range of motion     Wound extent: fascia not violated, no foreign body and no signs of injury     Contaminated: no   Treatment:    Area cleansed with:  Saline   Amount of cleaning:  Standard   Irrigation solution:  Sterile saline Skin repair:    Repair method:  Sutures   Suture size:  3-0   Suture material:  Prolene   Suture technique:  Simple interrupted Approximation:    Approximation:  Close Repair type:    Repair type:  Simple Post-procedure details:    Dressing:  Non-adherent dressing   Procedure completion:  Tolerated well, no immediate complications     Medications Ordered in ED Medications  lidocaine-EPINEPHrine (XYLOCAINE W/EPI) 2 %-1:200000 (PF) injection 10 mL (10 mLs Infiltration Given 08/12/22 0347)    ED Course/ Medical Decision Making/ A&P                             Medical Decision Making Amount and/or Complexity of Data Reviewed Radiology: ordered. ECG/medicine tests: ordered.  Risk Prescription drug management.   Patient presenting from memory care facility with concern for likely mechanical fall  The patient does have dementia, but was able to provide me detailed and reliable account of his fall today, therefore I do suspect this is likely mechanical and not likely a medical cause of syncope.  We can check a blood sugar and EKG.  CT scan of the head ordered as there may have been head injury, although no clear evidence of trauma.  His left wrist wound is superficial, was cleaned and bandaged.  Does not require sutures.  His left leg wound was larger, requiring sutures.  This was irrigated, lidocaine was used for anesthesia, and was repaired with sutures, which will need to be removed.  The patient is otherwise stable for return to his facility.  Supplemental history is provided by EMS.  I reviewed the patient external records,  including most recent hospitalization course in May 2024 and discharge summary, where he was noted to be admitted with right arm cellulitis and streptococcal bacteremia, had a TTE with no evidence of valvular vegetation, and concern for olecranon bursitis of the right elbow.  The patient was treated with an extended course of antibiotics by infectious disease team, 6 weeks cefazlin, completed  08/05/22, with picc line removed from his arm.        Final Clinical Impression(s) / ED Diagnoses Final diagnoses:  Fall, initial encounter  Laceration of left lower extremity, initial encounter    Rx / DC Orders ED Discharge Orders     None         Sandip Power, Kermit Balo, MD 08/12/22 1950

## 2022-08-15 ENCOUNTER — Ambulatory Visit (INDEPENDENT_AMBULATORY_CARE_PROVIDER_SITE_OTHER): Payer: Medicare Other | Admitting: Infectious Diseases

## 2022-08-15 ENCOUNTER — Encounter: Payer: Self-pay | Admitting: Infectious Diseases

## 2022-08-15 ENCOUNTER — Other Ambulatory Visit: Payer: Self-pay

## 2022-08-15 VITALS — BP 162/68 | HR 70 | Resp 16 | Ht 68.0 in | Wt 128.9 lb

## 2022-08-15 DIAGNOSIS — M7021 Olecranon bursitis, right elbow: Secondary | ICD-10-CM

## 2022-08-15 DIAGNOSIS — Z87891 Personal history of nicotine dependence: Secondary | ICD-10-CM | POA: Diagnosis not present

## 2022-08-15 DIAGNOSIS — N3281 Overactive bladder: Secondary | ICD-10-CM | POA: Diagnosis not present

## 2022-08-15 DIAGNOSIS — I2510 Atherosclerotic heart disease of native coronary artery without angina pectoris: Secondary | ICD-10-CM | POA: Diagnosis not present

## 2022-08-15 DIAGNOSIS — Z79899 Other long term (current) drug therapy: Secondary | ICD-10-CM

## 2022-08-15 DIAGNOSIS — M86131 Other acute osteomyelitis, right radius and ulna: Secondary | ICD-10-CM | POA: Diagnosis not present

## 2022-08-15 DIAGNOSIS — Z8619 Personal history of other infectious and parasitic diseases: Secondary | ICD-10-CM | POA: Diagnosis not present

## 2022-08-15 DIAGNOSIS — Z952 Presence of prosthetic heart valve: Secondary | ICD-10-CM | POA: Diagnosis not present

## 2022-08-15 DIAGNOSIS — Z8673 Personal history of transient ischemic attack (TIA), and cerebral infarction without residual deficits: Secondary | ICD-10-CM | POA: Diagnosis not present

## 2022-08-15 DIAGNOSIS — I4821 Permanent atrial fibrillation: Secondary | ICD-10-CM | POA: Diagnosis not present

## 2022-08-15 DIAGNOSIS — N400 Enlarged prostate without lower urinary tract symptoms: Secondary | ICD-10-CM | POA: Diagnosis not present

## 2022-08-15 DIAGNOSIS — I130 Hypertensive heart and chronic kidney disease with heart failure and stage 1 through stage 4 chronic kidney disease, or unspecified chronic kidney disease: Secondary | ICD-10-CM | POA: Diagnosis not present

## 2022-08-15 DIAGNOSIS — N183 Chronic kidney disease, stage 3 unspecified: Secondary | ICD-10-CM | POA: Diagnosis not present

## 2022-08-15 DIAGNOSIS — I272 Pulmonary hypertension, unspecified: Secondary | ICD-10-CM | POA: Diagnosis not present

## 2022-08-15 DIAGNOSIS — I5022 Chronic systolic (congestive) heart failure: Secondary | ICD-10-CM | POA: Diagnosis not present

## 2022-08-15 DIAGNOSIS — Z9181 History of falling: Secondary | ICD-10-CM | POA: Diagnosis not present

## 2022-08-15 DIAGNOSIS — E440 Moderate protein-calorie malnutrition: Secondary | ICD-10-CM | POA: Diagnosis not present

## 2022-08-15 DIAGNOSIS — J449 Chronic obstructive pulmonary disease, unspecified: Secondary | ICD-10-CM | POA: Diagnosis not present

## 2022-08-15 DIAGNOSIS — Z7901 Long term (current) use of anticoagulants: Secondary | ICD-10-CM | POA: Diagnosis not present

## 2022-08-15 NOTE — Progress Notes (Addendum)
Patient Active Problem List   Diagnosis Date Noted   Pyogenic inflammation of bone (HCC) 07/20/2022   PICC (peripherally inserted central catheter) in place 07/20/2022   Medication management 07/20/2022   Streptococcal bacteremia 06/24/2022   Pressure injury of skin 06/24/2022   Right arm cellulitis 06/23/2022   Olecranon bursitis of right elbow 06/23/2022   DNR (do not resuscitate)/DNI(Do Not Intubate) 06/23/2022   Chronic systolic CHF (congestive heart failure) (HCC) 04/10/2022   Normocytic anemia 11/21/2021   CKD stage 3a, GFR 45-59 ml/min (HCC) 11/21/2021   Thrombocytopenia (HCC) 11/21/2021   BPH (benign prostatic hyperplasia) 11/21/2021   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) 10/05/2021   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   Pain of left hand 08/12/2020   Abrasion of unspecified back wall of thorax, initial encounter 07/23/2020   Fall 07/23/2020   Hearing loss 07/23/2020   Laceration without foreign body of right elbow, initial encounter 07/23/2020   Gross hematuria    DCM (dilated cardiomyopathy) (HCC)    Chronic atrial fibrillation (HCC)    Congestive heart failure (HCC) 06/12/2020   Dyspnea 04/24/2019   Chronic obstructive pulmonary disease (HCC) 06/10/2018   Hardening of the aorta (main artery of the heart) (HCC) 06/10/2018   Hypertensive heart and chronic kidney disease with heart failure and stage 1 through stage 4 chronic kidney disease, or unspecified chronic kidney disease (HCC) 04/24/2018   Aorta aneurysm (HCC) 02/14/2018   Cirrhosis of liver (HCC) 02/08/2018   Constipation 01/15/2018   Acute on chronic combined systolic and diastolic CHF (congestive heart failure) (HCC) 09/25/2017   Anticoagulated on Coumadin 08/28/2017   S/P TAVR (transcatheter aortic valve replacement)    Presence of prosthetic heart valve 09/06/2016   S/P TAVR (transcatheter aortic valve replacement) 06/13/2016   Incidental cecal mass noted on CT imaging 05/24/2016   Inguinal  hernia 02/24/2016   Exudative age-related macular 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 (HCC) 12/22/2014   Non-thrombocytopenic purpura (HCC) 08/19/2014   Encounter for therapeutic drug monitoring 03/20/2013   Pseudophakia of both eyes 03/19/2013   Macular degeneration 03/19/2013   Hematuria, microscopic 12/03/2012   Anxiety disorder 06/20/2012   Long term (current) use of anticoagulants 06/20/2012   Cystoid macular edema 05/14/2012   Overweight (BMI 25.0-29.9) 01/23/2012   Hyponatremia 01/23/2012   S/P right UKR 01/22/2012   Carotid artery bruit 11/21/2011   Carotid bruit 11/21/2011   Presence of intraocular lens 02/16/2011   Osteoarthritis 07/05/2010   EDEMA 07/14/2009   Essential hypertension 01/27/2009   HYPERCHOLESTEROLEMIA  IIA 10/21/2008   CAD (coronary artery disease) 10/21/2008   Permanent atrial fibrillation (HCC) 10/21/2008    Patient's Medications  New Prescriptions   No medications on file  Previous Medications   ACETAMINOPHEN (TYLENOL) 500 MG TABLET    Take 500 mg by mouth every 4 (four) hours as needed for moderate pain.   ATORVASTATIN (LIPITOR) 20 MG TABLET    TAKE 1 TABLET ONCE DAILY.   BETAMETHASONE DIPROPIONATE 0.05 % CREAM    1 application Externally Once a day   CALCIUM-MAGNESIUM-ZINC (CAL-MAG-ZINC PO)    Take 1 tablet by mouth every morning.   CHOLECALCIFEROL (VITAMIN D-3) 125 MCG (5000 UT) TABS    Take 5,000 Units by mouth every morning.   FESOTERODINE (TOVIAZ) 8 MG TB24 TABLET    Take 8 mg by mouth daily.   FINASTERIDE (PROSCAR) 5 MG TABLET    Take 1 tablet (5 mg total) by mouth daily.  FUROSEMIDE (LASIX) 40 MG TABLET    Take 1.5 tablets (60 mg total) by mouth daily.   ISOSORBIDE MONONITRATE (IMDUR) 30 MG 24 HR TABLET    Take 1 tablet (30 mg total) by mouth daily.   LACTULOSE (CHRONULAC) 10 GM/15ML SOLUTION    Take 30 mLs (20 g total) by mouth daily.   LORAZEPAM (ATIVAN) 1 MG TABLET    Take 1 tablet (1 mg total) by mouth 2  (two) times daily as needed for anxiety.   METOPROLOL SUCCINATE (TOPROL XL) 25 MG 24 HR TABLET    Take 0.5 tablets (12.5 mg total) by mouth daily.   MULTIPLE VITAMINS-MINERALS (PRESERVISION AREDS) TABS    Take 1 tablet by mouth daily.   NITROGLYCERIN (NITROSTAT) 0.4 MG SL TABLET    Place 0.4 mg under the tongue every 5 (five) minutes as needed for chest pain.   NYSTATIN CREAM (MYCOSTATIN)    Apply 1 Application topically at bedtime. Apply to sacral and genital area   OXYCODONE (OXY IR/ROXICODONE) 5 MG IMMEDIATE RELEASE TABLET    Take 1 tablet (5 mg total) by mouth every 6 (six) hours as needed for severe pain.   POLYETHYLENE GLYCOL (MIRALAX / GLYCOLAX) 17 G PACKET    Take 17 g by mouth daily as needed.   POLYETHYLENE GLYCOL 3350 POWD    Take 17 g by mouth daily.   SACUBITRIL-VALSARTAN (ENTRESTO) 24-26 MG    Take 1 tablet by mouth 2 (two) times daily. Please keep upcoming appointment for future refills thank you.   SPIRONOLACTONE (ALDACTONE) 25 MG TABLET    Take 1 tablet (25 mg total) by mouth daily.   TAMSULOSIN (FLOMAX) 0.4 MG CAPS CAPSULE    Take 1 capsule (0.4 mg total) by mouth daily.   WARFARIN (COUMADIN) 5 MG TABLET    Take 5 mg by mouth daily.  Modified Medications   No medications on file  Discontinued Medications   No medications on file    Subjective: 89 Y O male with PMH as below including CHFrEF, CKD, HTN, severe aortic stenosis s/p TAVR who presented from ALF for fu for left olecranon bursitis/olecranon process and proximal osteomyelitis. Completed 6 weeks of IV abtx 08/05/22. After last video visit on 6/6, patient had another ED visit on 6/29 for fall leading to left wrist superficial wound, left leg wound - repaired with sutures and was discharged after work up was unremarkable.   08/15/22 Accompanied by great grandson. Sitting in a wheel chair. He has a bandage in the left leg wound and as well as left wrist. He lives in an assisted living with [facility]. Grandson reports wound  has dressing changed daily with no concerns reported. Denies fevers, chills. Denies nausea, vomiting and diarrhea. Denies any concerns at the left elbow.   Review of Systems: all systems reviewed with pertinent positives and negatives as listed above  Past Medical History:  Diagnosis Date   AAA (abdominal aortic aneurysm) (HCC)    s/p repair   Acute on chronic systolic (congestive) heart failure (HCC) 12/22/2021   Aortic atherosclerosis (HCC)    Arthritis    knees   CAD (coronary artery disease)    a. s/p POBA 1980s, 1990s (details unclear). b. known stenotic RCA (previous unsuccessful intervention)   Cancer (HCC)    skin cancer removed, right hand, left leg, chest   Carotid artery disease (HCC)    Cholecystitis, acute 12/06/2012   Chronic anticoagulation    Coumadin   Chronic kidney disease, stage 3a (  HCC)    Cirrhosis of liver (HCC)    by imaging   Coronary artery disease    GERD (gastroesophageal reflux disease)    Heart failure with mildly reduced ejection fraction (HFmrEF) (HCC)    History of pulmonary embolism    1964 related to dislocated hip on right    History of skin cancer    Hypercholesteremia    Hypertension    Incidental cecal mass noted on CT imaging 05/24/2016   **An incidental finding of potential clinical significance has been found. 4.6 x 5.6 x 3.7 cm masslike lesion in the cecum adjacent to the ileocecal valve may represent a large polyp or colonic neoplasm. Correlation with nonemergent colonoscopy is strongly recommended in the near future to better evaluate this finding.**   Macular degeneration    eye injections   Microscopic hematuria    Followed by urology   Permanent atrial fibrillation [facility])    Pulmonary hypertension (HCC)    PVD (peripheral vascular disease) (HCC)    Bilateral renal artery atherosclerosis, SMA stenosis with collateralization   RBBB    Chronic   Rib fracture 04/11/2022   S/P TAVR (transcatheter aortic valve replacement)  06/13/2016   29 mm Edwards Sapien 3 transcatheter heart valve placed via percutaneous right transfemoral approach   Severe aortic stenosis    s/p TAVR   SSS (sick sinus syndrome) (HCC)    Past Surgical History:  Procedure Laterality Date   ABDOMINAL AORTIC ANEURYSM REPAIR     1993    CARDIAC CATHETERIZATION  05/24/2012   pD1 20%, oCFX 20%, mCFX 30%, ostial Int Br 30%, distal AV groove CFX 30%, pRCA 30-40%, mRCA 99%, dRCA 30%, PL branch small with diffuse 40%. Unable to pass wire past RCA lesion->medically managed   CHOLECYSTECTOMY N/A 12/05/2012   Procedure: LAPAROSCOPIC CHOLECYSTECTOMY ;  Surgeon: Mariella Saa, MD;  Location: [facility] OR;  Service: General;  Laterality: N/A;   CHOLECYSTECTOMY     CORONARY ANGIOPLASTY  1980, 1990   CORONARY BALLOON ANGIOPLASTY N/A 08/31/2017   Procedure: CORONARY BALLOON ANGIOPLASTY;  Surgeon: Swaziland, Peter M, MD;  Location: [facility] INVASIVE CV LAB;  Service: Cardiovascular;  Laterality: N/A;   EYE SURGERY     hx of cataract surgery    HEMORRHOID SURGERY     1973   HERNIA REPAIR     right inguinal hernia repair    JOINT REPLACEMENT     left knee replacement, partial right   LEFT HEART CATH AND CORONARY ANGIOGRAPHY N/A 08/31/2017   Procedure: LEFT HEART CATH AND CORONARY ANGIOGRAPHY;  Surgeon: Swaziland, Peter M, MD;  Location: [facility] INVASIVE CV LAB;  Service: Cardiovascular;  Laterality: N/A;   LEFT HEART CATHETERIZATION WITH CORONARY ANGIOGRAM N/A 05/24/2012   Procedure: LEFT HEART CATHETERIZATION WITH CORONARY ANGIOGRAM;  Surgeon: Kathleene Hazel, MD;  Location: [facility] CATH LAB;  Service: Cardiovascular;  Laterality: N/A;   ORTHOPEDIC SURGERY     OTHER SURGICAL HISTORY     right knee popliteal aneurysm surgery stent placed    OTHER SURGICAL HISTORY     PARTIAL KNEE ARTHROPLASTY  01/22/2012   Procedure: UNICOMPARTMENTAL KNEE;  Surgeon: [name], MD;  Location: [facility] ORS;  Service: Orthopedics;  Laterality: Right;   PERCUTANEOUS CORONARY  INTERVENTION-BALLOON ONLY  05/24/2012   Procedure: PERCUTANEOUS CORONARY INTERVENTION-BALLOON ONLY;  Surgeon: Kathleene Hazel, MD;  Location: [facility] CATH LAB;  Service: Cardiovascular;;   RIGHT/LEFT HEART CATH AND CORONARY ANGIOGRAPHY N/A 05/12/2016   Procedure: Right/Left Heart Cath and Coronary Angiography;  Surgeon: Tonny Bollman, MD;  Location: [facility] INVASIVE CV LAB;  Service: Cardiovascular;  Laterality: N/A;   TEE WITHOUT CARDIOVERSION N/A 06/13/2016   Procedure: TRANSESOPHAGEAL ECHOCARDIOGRAM (TEE);  Surgeon: Tonny Bollman, MD;  Location: [facility] OR;  Service: Open Heart Surgery;  Laterality: N/A;   TONSILLECTOMY     TOTAL KNEE ARTHROPLASTY Left    TRANSCATHETER AORTIC VALVE REPLACEMENT, TRANSFEMORAL N/A 06/13/2016   Procedure: TRANSCATHETER AORTIC VALVE REPLACEMENT, TRANSFEMORAL;  Surgeon: Tonny Bollman, MD;  Location: [facility] OR;  Service: Open Heart Surgery;  Laterality: N/A;     Social History   Tobacco Use   Smoking status: Never   Smokeless tobacco: Never  Vaping Use   Vaping Use: Never used  Substance Use Topics   Alcohol use: Not Currently    Comment: patient doesn't drink anymore   Drug use: Never    Family History  Problem Relation Age of Onset   Heart attack Mother 65    No Known Allergies  Health Maintenance  Topic Date Due   Zoster Vaccines- Shingrix (1 of 2) Never done   Pneumonia Vaccine 45+ Years old (1 of 1 - PCV) Never done   COVID-19 Vaccine (3 - Pfizer risk series) 04/21/2019   Medicare Annual Wellness (AWV)  09/27/2021   INFLUENZA VACCINE  09/14/2022   DTaP/Tdap/Td (2 - Td or Tdap) 04/28/2031   HPV VACCINES  Aged Out    Objective:  Vitals:   08/15/22 0928 08/15/22 0933  BP: (!) 163/68 (!) 162/68  Pulse: 70   Resp: 16   SpO2: 98%   Weight: 128 lb 14.4 oz (58.5 kg)   Height: 5\' 8"  (1.727 m)    Body mass index is 19.6 kg/m.  Physical Exam Constitutional:      Appearance: elderly male sitting in the wheel chair  HENT:     Head: Normocephalic  and atraumatic.      Mouth: Mucous membranes are moist.  Eyes:    Conjunctiva/sclera: Conjunctivae normal.     Pupils: Pupils are equal, round, and bilaterally symmetrical   Cardiovascular:     Rate and Rhythm: Normal rate and regular rhythm.     Heart sounds:   Pulmonary:     Effort: Pulmonary effort is normal.     Breath sounds: Normal breath sounds.   Abdominal:     General: Non distended     Palpations: soft.   Musculoskeletal:        General: Left elbow with no redness, warmth and tenderness. Good ROM of left elbow  Skin:    General: Skin is warm and dry.     Comments: Left wrist and left leg is bandaged C/D/I - no concerns per patient/family  Neurological:     General: grossly non focal     Mental Status: awake, follows basic commands, oriented to place, person  Psychiatric:        Mood and Affect: Mood normal.   Lab Results Lab Results  Component Value Date   WBC 7.9 06/30/2022   HGB 10.1 (L) 06/30/2022   HCT 32.0 (L) 06/30/2022   MCV 94.4 06/30/2022   PLT 191 06/30/2022    Lab Results  Component Value Date   CREATININE 1.23 06/30/2022   BUN 42 (H) 06/30/2022   NA 132 (L) 06/30/2022   K 4.1 06/30/2022   CL 102 06/30/2022   CO2 24 06/30/2022    Lab Results  Component Value Date   ALT 15 06/24/2022   AST 17 06/24/2022   ALKPHOS 105  06/24/2022   BILITOT 1.3 (H) 06/24/2022    Lab Results  Component Value Date   CHOL 149 08/28/2017   HDL 52 08/28/2017   LDLCALC 78 08/28/2017   TRIG 95 08/28/2017   CHOLHDL 2.9 08/28/2017   No results found for: "LABRPR", "RPRTITER" No results found for: "HIV1RNAQUANT", "HIV1RNAVL", "CD4TABS"   Assessment 19 Y O male with PMH as below including CHFrEF, CKD, HTN, severe aortic stenosis s/p TAVR, recurrent falls with    # Group A strep bacteremia 2/2 # Fall leading to rt forearm cellulitis and olecranon bursitis/abscess # Possible early osteomyelitis of olecranon process/proximal ulna  Repeat blood cx 5/12 NG in  5 days  5/12 TTE with no vegetations  Conservative management per Orthopedics. No surgical intervention recommended   Plan  Completed 6 weeks course of IV abtx 6/22 No labs today Fu as needed    # Liver cirrhosis  5/5 hep B surface ab < 3.5, HCV ab negative  Needs  HCC screening as well variceal screen, per PCP  I have personally spent 31  minutes involved in face-to-face and non-face-to-face activities for this patient on the day of the visit. Professional time spent includes the following activities: Preparing to see the patient (review of tests), Obtaining and/or reviewing separately obtained history (admission/discharge record), Performing a medically appropriate examination and/or evaluation , Ordering medications/tests/procedures, referring and communicating with other health care professionals, Documenting clinical information in the EMR, Independently interpreting results (not separately reported), Communicating results to the patient/family/caregiver, Counseling and educating the patient/family/caregiver and Care coordination (not separately reported).   Victoriano Lain, MD [facility] 08/15/2022, 9:37 AM

## 2022-08-16 DIAGNOSIS — N183 Chronic kidney disease, stage 3 unspecified: Secondary | ICD-10-CM | POA: Diagnosis not present

## 2022-08-16 DIAGNOSIS — I5022 Chronic systolic (congestive) heart failure: Secondary | ICD-10-CM | POA: Diagnosis not present

## 2022-08-16 DIAGNOSIS — I130 Hypertensive heart and chronic kidney disease with heart failure and stage 1 through stage 4 chronic kidney disease, or unspecified chronic kidney disease: Secondary | ICD-10-CM | POA: Diagnosis not present

## 2022-08-16 DIAGNOSIS — I4821 Permanent atrial fibrillation: Secondary | ICD-10-CM | POA: Diagnosis not present

## 2022-08-16 DIAGNOSIS — Z7901 Long term (current) use of anticoagulants: Secondary | ICD-10-CM | POA: Diagnosis not present

## 2022-08-16 DIAGNOSIS — I2510 Atherosclerotic heart disease of native coronary artery without angina pectoris: Secondary | ICD-10-CM | POA: Diagnosis not present

## 2022-08-18 DIAGNOSIS — I4821 Permanent atrial fibrillation: Secondary | ICD-10-CM | POA: Diagnosis not present

## 2022-08-18 DIAGNOSIS — I130 Hypertensive heart and chronic kidney disease with heart failure and stage 1 through stage 4 chronic kidney disease, or unspecified chronic kidney disease: Secondary | ICD-10-CM | POA: Diagnosis not present

## 2022-08-18 DIAGNOSIS — N183 Chronic kidney disease, stage 3 unspecified: Secondary | ICD-10-CM | POA: Diagnosis not present

## 2022-08-18 DIAGNOSIS — Z7901 Long term (current) use of anticoagulants: Secondary | ICD-10-CM | POA: Diagnosis not present

## 2022-08-18 DIAGNOSIS — I2510 Atherosclerotic heart disease of native coronary artery without angina pectoris: Secondary | ICD-10-CM | POA: Diagnosis not present

## 2022-08-18 DIAGNOSIS — I5022 Chronic systolic (congestive) heart failure: Secondary | ICD-10-CM | POA: Diagnosis not present

## 2022-08-21 DIAGNOSIS — I2510 Atherosclerotic heart disease of native coronary artery without angina pectoris: Secondary | ICD-10-CM | POA: Diagnosis not present

## 2022-08-21 DIAGNOSIS — Z7901 Long term (current) use of anticoagulants: Secondary | ICD-10-CM | POA: Diagnosis not present

## 2022-08-21 DIAGNOSIS — N183 Chronic kidney disease, stage 3 unspecified: Secondary | ICD-10-CM | POA: Diagnosis not present

## 2022-08-21 DIAGNOSIS — I130 Hypertensive heart and chronic kidney disease with heart failure and stage 1 through stage 4 chronic kidney disease, or unspecified chronic kidney disease: Secondary | ICD-10-CM | POA: Diagnosis not present

## 2022-08-21 DIAGNOSIS — I5022 Chronic systolic (congestive) heart failure: Secondary | ICD-10-CM | POA: Diagnosis not present

## 2022-08-21 DIAGNOSIS — I4821 Permanent atrial fibrillation: Secondary | ICD-10-CM | POA: Diagnosis not present

## 2022-08-23 DIAGNOSIS — Z7901 Long term (current) use of anticoagulants: Secondary | ICD-10-CM | POA: Diagnosis not present

## 2022-08-23 DIAGNOSIS — I2510 Atherosclerotic heart disease of native coronary artery without angina pectoris: Secondary | ICD-10-CM | POA: Diagnosis not present

## 2022-08-23 DIAGNOSIS — I130 Hypertensive heart and chronic kidney disease with heart failure and stage 1 through stage 4 chronic kidney disease, or unspecified chronic kidney disease: Secondary | ICD-10-CM | POA: Diagnosis not present

## 2022-08-23 DIAGNOSIS — I5022 Chronic systolic (congestive) heart failure: Secondary | ICD-10-CM | POA: Diagnosis not present

## 2022-08-23 DIAGNOSIS — I4821 Permanent atrial fibrillation: Secondary | ICD-10-CM | POA: Diagnosis not present

## 2022-08-23 DIAGNOSIS — N183 Chronic kidney disease, stage 3 unspecified: Secondary | ICD-10-CM | POA: Diagnosis not present

## 2022-08-25 DIAGNOSIS — N183 Chronic kidney disease, stage 3 unspecified: Secondary | ICD-10-CM | POA: Diagnosis not present

## 2022-08-25 DIAGNOSIS — Z7901 Long term (current) use of anticoagulants: Secondary | ICD-10-CM | POA: Diagnosis not present

## 2022-08-25 DIAGNOSIS — I5022 Chronic systolic (congestive) heart failure: Secondary | ICD-10-CM | POA: Diagnosis not present

## 2022-08-25 DIAGNOSIS — I4821 Permanent atrial fibrillation: Secondary | ICD-10-CM | POA: Diagnosis not present

## 2022-08-25 DIAGNOSIS — I2510 Atherosclerotic heart disease of native coronary artery without angina pectoris: Secondary | ICD-10-CM | POA: Diagnosis not present

## 2022-08-25 DIAGNOSIS — I130 Hypertensive heart and chronic kidney disease with heart failure and stage 1 through stage 4 chronic kidney disease, or unspecified chronic kidney disease: Secondary | ICD-10-CM | POA: Diagnosis not present

## 2022-08-28 DIAGNOSIS — I5022 Chronic systolic (congestive) heart failure: Secondary | ICD-10-CM | POA: Diagnosis not present

## 2022-08-28 DIAGNOSIS — I4821 Permanent atrial fibrillation: Secondary | ICD-10-CM | POA: Diagnosis not present

## 2022-08-28 DIAGNOSIS — N183 Chronic kidney disease, stage 3 unspecified: Secondary | ICD-10-CM | POA: Diagnosis not present

## 2022-08-28 DIAGNOSIS — Z7901 Long term (current) use of anticoagulants: Secondary | ICD-10-CM | POA: Diagnosis not present

## 2022-08-28 DIAGNOSIS — I130 Hypertensive heart and chronic kidney disease with heart failure and stage 1 through stage 4 chronic kidney disease, or unspecified chronic kidney disease: Secondary | ICD-10-CM | POA: Diagnosis not present

## 2022-08-28 DIAGNOSIS — I2510 Atherosclerotic heart disease of native coronary artery without angina pectoris: Secondary | ICD-10-CM | POA: Diagnosis not present

## 2022-08-30 DIAGNOSIS — I4821 Permanent atrial fibrillation: Secondary | ICD-10-CM | POA: Diagnosis not present

## 2022-08-30 DIAGNOSIS — I5022 Chronic systolic (congestive) heart failure: Secondary | ICD-10-CM | POA: Diagnosis not present

## 2022-08-30 DIAGNOSIS — N183 Chronic kidney disease, stage 3 unspecified: Secondary | ICD-10-CM | POA: Diagnosis not present

## 2022-08-30 DIAGNOSIS — I2510 Atherosclerotic heart disease of native coronary artery without angina pectoris: Secondary | ICD-10-CM | POA: Diagnosis not present

## 2022-08-30 DIAGNOSIS — I130 Hypertensive heart and chronic kidney disease with heart failure and stage 1 through stage 4 chronic kidney disease, or unspecified chronic kidney disease: Secondary | ICD-10-CM | POA: Diagnosis not present

## 2022-08-30 DIAGNOSIS — Z7901 Long term (current) use of anticoagulants: Secondary | ICD-10-CM | POA: Diagnosis not present

## 2022-08-31 DIAGNOSIS — I2510 Atherosclerotic heart disease of native coronary artery without angina pectoris: Secondary | ICD-10-CM | POA: Diagnosis not present

## 2022-08-31 DIAGNOSIS — I4821 Permanent atrial fibrillation: Secondary | ICD-10-CM | POA: Diagnosis not present

## 2022-08-31 DIAGNOSIS — I5022 Chronic systolic (congestive) heart failure: Secondary | ICD-10-CM | POA: Diagnosis not present

## 2022-08-31 DIAGNOSIS — I130 Hypertensive heart and chronic kidney disease with heart failure and stage 1 through stage 4 chronic kidney disease, or unspecified chronic kidney disease: Secondary | ICD-10-CM | POA: Diagnosis not present

## 2022-08-31 DIAGNOSIS — Z7901 Long term (current) use of anticoagulants: Secondary | ICD-10-CM | POA: Diagnosis not present

## 2022-08-31 DIAGNOSIS — N183 Chronic kidney disease, stage 3 unspecified: Secondary | ICD-10-CM | POA: Diagnosis not present

## 2022-09-04 DIAGNOSIS — N401 Enlarged prostate with lower urinary tract symptoms: Secondary | ICD-10-CM | POA: Diagnosis not present

## 2022-09-04 DIAGNOSIS — K7460 Unspecified cirrhosis of liver: Secondary | ICD-10-CM | POA: Diagnosis not present

## 2022-09-04 DIAGNOSIS — M1990 Unspecified osteoarthritis, unspecified site: Secondary | ICD-10-CM | POA: Diagnosis not present

## 2022-09-04 DIAGNOSIS — I5022 Chronic systolic (congestive) heart failure: Secondary | ICD-10-CM | POA: Diagnosis not present

## 2022-09-04 DIAGNOSIS — I739 Peripheral vascular disease, unspecified: Secondary | ICD-10-CM | POA: Diagnosis not present

## 2022-09-04 DIAGNOSIS — I130 Hypertensive heart and chronic kidney disease with heart failure and stage 1 through stage 4 chronic kidney disease, or unspecified chronic kidney disease: Secondary | ICD-10-CM | POA: Diagnosis not present

## 2022-09-04 DIAGNOSIS — I4821 Permanent atrial fibrillation: Secondary | ICD-10-CM | POA: Diagnosis not present

## 2022-09-04 DIAGNOSIS — I495 Sick sinus syndrome: Secondary | ICD-10-CM | POA: Diagnosis not present

## 2022-09-04 DIAGNOSIS — N183 Chronic kidney disease, stage 3 unspecified: Secondary | ICD-10-CM | POA: Diagnosis not present

## 2022-09-04 DIAGNOSIS — Z7901 Long term (current) use of anticoagulants: Secondary | ICD-10-CM | POA: Diagnosis not present

## 2022-09-04 DIAGNOSIS — I480 Paroxysmal atrial fibrillation: Secondary | ICD-10-CM | POA: Diagnosis not present

## 2022-09-04 DIAGNOSIS — I2510 Atherosclerotic heart disease of native coronary artery without angina pectoris: Secondary | ICD-10-CM | POA: Diagnosis not present

## 2022-09-04 DIAGNOSIS — N1831 Chronic kidney disease, stage 3a: Secondary | ICD-10-CM | POA: Diagnosis not present

## 2022-09-04 DIAGNOSIS — H3530 Unspecified macular degeneration: Secondary | ICD-10-CM | POA: Diagnosis not present

## 2022-09-04 DIAGNOSIS — D464 Refractory anemia, unspecified: Secondary | ICD-10-CM | POA: Diagnosis not present

## 2022-09-06 DIAGNOSIS — I130 Hypertensive heart and chronic kidney disease with heart failure and stage 1 through stage 4 chronic kidney disease, or unspecified chronic kidney disease: Secondary | ICD-10-CM | POA: Diagnosis not present

## 2022-09-06 DIAGNOSIS — I5022 Chronic systolic (congestive) heart failure: Secondary | ICD-10-CM | POA: Diagnosis not present

## 2022-09-06 DIAGNOSIS — I2510 Atherosclerotic heart disease of native coronary artery without angina pectoris: Secondary | ICD-10-CM | POA: Diagnosis not present

## 2022-09-06 DIAGNOSIS — I4821 Permanent atrial fibrillation: Secondary | ICD-10-CM | POA: Diagnosis not present

## 2022-09-06 DIAGNOSIS — Z7901 Long term (current) use of anticoagulants: Secondary | ICD-10-CM | POA: Diagnosis not present

## 2022-09-06 DIAGNOSIS — N183 Chronic kidney disease, stage 3 unspecified: Secondary | ICD-10-CM | POA: Diagnosis not present

## 2022-09-11 DIAGNOSIS — N183 Chronic kidney disease, stage 3 unspecified: Secondary | ICD-10-CM | POA: Diagnosis not present

## 2022-09-11 DIAGNOSIS — I2510 Atherosclerotic heart disease of native coronary artery without angina pectoris: Secondary | ICD-10-CM | POA: Diagnosis not present

## 2022-09-11 DIAGNOSIS — I4821 Permanent atrial fibrillation: Secondary | ICD-10-CM | POA: Diagnosis not present

## 2022-09-11 DIAGNOSIS — Z7901 Long term (current) use of anticoagulants: Secondary | ICD-10-CM | POA: Diagnosis not present

## 2022-09-11 DIAGNOSIS — I130 Hypertensive heart and chronic kidney disease with heart failure and stage 1 through stage 4 chronic kidney disease, or unspecified chronic kidney disease: Secondary | ICD-10-CM | POA: Diagnosis not present

## 2022-09-11 DIAGNOSIS — I5022 Chronic systolic (congestive) heart failure: Secondary | ICD-10-CM | POA: Diagnosis not present

## 2022-09-13 DIAGNOSIS — Z7901 Long term (current) use of anticoagulants: Secondary | ICD-10-CM | POA: Diagnosis not present

## 2022-09-13 DIAGNOSIS — I4821 Permanent atrial fibrillation: Secondary | ICD-10-CM | POA: Diagnosis not present

## 2022-09-13 DIAGNOSIS — I5022 Chronic systolic (congestive) heart failure: Secondary | ICD-10-CM | POA: Diagnosis not present

## 2022-09-13 DIAGNOSIS — I130 Hypertensive heart and chronic kidney disease with heart failure and stage 1 through stage 4 chronic kidney disease, or unspecified chronic kidney disease: Secondary | ICD-10-CM | POA: Diagnosis not present

## 2022-09-13 DIAGNOSIS — I2510 Atherosclerotic heart disease of native coronary artery without angina pectoris: Secondary | ICD-10-CM | POA: Diagnosis not present

## 2022-09-13 DIAGNOSIS — N183 Chronic kidney disease, stage 3 unspecified: Secondary | ICD-10-CM | POA: Diagnosis not present

## 2022-09-14 DIAGNOSIS — I130 Hypertensive heart and chronic kidney disease with heart failure and stage 1 through stage 4 chronic kidney disease, or unspecified chronic kidney disease: Secondary | ICD-10-CM | POA: Diagnosis not present

## 2022-09-14 DIAGNOSIS — Z8619 Personal history of other infectious and parasitic diseases: Secondary | ICD-10-CM | POA: Diagnosis not present

## 2022-09-14 DIAGNOSIS — Z87891 Personal history of nicotine dependence: Secondary | ICD-10-CM | POA: Diagnosis not present

## 2022-09-14 DIAGNOSIS — N3281 Overactive bladder: Secondary | ICD-10-CM | POA: Diagnosis not present

## 2022-09-14 DIAGNOSIS — E440 Moderate protein-calorie malnutrition: Secondary | ICD-10-CM | POA: Diagnosis not present

## 2022-09-14 DIAGNOSIS — Z7901 Long term (current) use of anticoagulants: Secondary | ICD-10-CM | POA: Diagnosis not present

## 2022-09-14 DIAGNOSIS — J449 Chronic obstructive pulmonary disease, unspecified: Secondary | ICD-10-CM | POA: Diagnosis not present

## 2022-09-14 DIAGNOSIS — I272 Pulmonary hypertension, unspecified: Secondary | ICD-10-CM | POA: Diagnosis not present

## 2022-09-14 DIAGNOSIS — Z9181 History of falling: Secondary | ICD-10-CM | POA: Diagnosis not present

## 2022-09-14 DIAGNOSIS — I4821 Permanent atrial fibrillation: Secondary | ICD-10-CM | POA: Diagnosis not present

## 2022-09-14 DIAGNOSIS — I2510 Atherosclerotic heart disease of native coronary artery without angina pectoris: Secondary | ICD-10-CM | POA: Diagnosis not present

## 2022-09-14 DIAGNOSIS — Z8673 Personal history of transient ischemic attack (TIA), and cerebral infarction without residual deficits: Secondary | ICD-10-CM | POA: Diagnosis not present

## 2022-09-14 DIAGNOSIS — I5022 Chronic systolic (congestive) heart failure: Secondary | ICD-10-CM | POA: Diagnosis not present

## 2022-09-14 DIAGNOSIS — N400 Enlarged prostate without lower urinary tract symptoms: Secondary | ICD-10-CM | POA: Diagnosis not present

## 2022-09-14 DIAGNOSIS — N183 Chronic kidney disease, stage 3 unspecified: Secondary | ICD-10-CM | POA: Diagnosis not present

## 2022-09-14 DIAGNOSIS — Z952 Presence of prosthetic heart valve: Secondary | ICD-10-CM | POA: Diagnosis not present

## 2022-09-15 DIAGNOSIS — I130 Hypertensive heart and chronic kidney disease with heart failure and stage 1 through stage 4 chronic kidney disease, or unspecified chronic kidney disease: Secondary | ICD-10-CM | POA: Diagnosis not present

## 2022-09-15 DIAGNOSIS — I5022 Chronic systolic (congestive) heart failure: Secondary | ICD-10-CM | POA: Diagnosis not present

## 2022-09-15 DIAGNOSIS — I4821 Permanent atrial fibrillation: Secondary | ICD-10-CM | POA: Diagnosis not present

## 2022-09-15 DIAGNOSIS — N183 Chronic kidney disease, stage 3 unspecified: Secondary | ICD-10-CM | POA: Diagnosis not present

## 2022-09-15 DIAGNOSIS — I2510 Atherosclerotic heart disease of native coronary artery without angina pectoris: Secondary | ICD-10-CM | POA: Diagnosis not present

## 2022-09-15 DIAGNOSIS — Z7901 Long term (current) use of anticoagulants: Secondary | ICD-10-CM | POA: Diagnosis not present

## 2022-09-17 DIAGNOSIS — I130 Hypertensive heart and chronic kidney disease with heart failure and stage 1 through stage 4 chronic kidney disease, or unspecified chronic kidney disease: Secondary | ICD-10-CM | POA: Diagnosis not present

## 2022-09-17 DIAGNOSIS — I2510 Atherosclerotic heart disease of native coronary artery without angina pectoris: Secondary | ICD-10-CM | POA: Diagnosis not present

## 2022-09-17 DIAGNOSIS — Z7901 Long term (current) use of anticoagulants: Secondary | ICD-10-CM | POA: Diagnosis not present

## 2022-09-17 DIAGNOSIS — I5022 Chronic systolic (congestive) heart failure: Secondary | ICD-10-CM | POA: Diagnosis not present

## 2022-09-17 DIAGNOSIS — I4821 Permanent atrial fibrillation: Secondary | ICD-10-CM | POA: Diagnosis not present

## 2022-09-17 DIAGNOSIS — N183 Chronic kidney disease, stage 3 unspecified: Secondary | ICD-10-CM | POA: Diagnosis not present

## 2022-09-18 DIAGNOSIS — I4821 Permanent atrial fibrillation: Secondary | ICD-10-CM | POA: Diagnosis not present

## 2022-09-18 DIAGNOSIS — I130 Hypertensive heart and chronic kidney disease with heart failure and stage 1 through stage 4 chronic kidney disease, or unspecified chronic kidney disease: Secondary | ICD-10-CM | POA: Diagnosis not present

## 2022-09-18 DIAGNOSIS — I5022 Chronic systolic (congestive) heart failure: Secondary | ICD-10-CM | POA: Diagnosis not present

## 2022-09-18 DIAGNOSIS — N183 Chronic kidney disease, stage 3 unspecified: Secondary | ICD-10-CM | POA: Diagnosis not present

## 2022-09-18 DIAGNOSIS — I2510 Atherosclerotic heart disease of native coronary artery without angina pectoris: Secondary | ICD-10-CM | POA: Diagnosis not present

## 2022-09-18 DIAGNOSIS — Z7901 Long term (current) use of anticoagulants: Secondary | ICD-10-CM | POA: Diagnosis not present

## 2022-09-21 DIAGNOSIS — I4821 Permanent atrial fibrillation: Secondary | ICD-10-CM | POA: Diagnosis not present

## 2022-09-21 DIAGNOSIS — I5022 Chronic systolic (congestive) heart failure: Secondary | ICD-10-CM | POA: Diagnosis not present

## 2022-09-21 DIAGNOSIS — I130 Hypertensive heart and chronic kidney disease with heart failure and stage 1 through stage 4 chronic kidney disease, or unspecified chronic kidney disease: Secondary | ICD-10-CM | POA: Diagnosis not present

## 2022-09-21 DIAGNOSIS — N183 Chronic kidney disease, stage 3 unspecified: Secondary | ICD-10-CM | POA: Diagnosis not present

## 2022-09-21 DIAGNOSIS — I2510 Atherosclerotic heart disease of native coronary artery without angina pectoris: Secondary | ICD-10-CM | POA: Diagnosis not present

## 2022-09-21 DIAGNOSIS — Z7901 Long term (current) use of anticoagulants: Secondary | ICD-10-CM | POA: Diagnosis not present

## 2022-09-22 DIAGNOSIS — Z7901 Long term (current) use of anticoagulants: Secondary | ICD-10-CM | POA: Diagnosis not present

## 2022-09-22 DIAGNOSIS — I130 Hypertensive heart and chronic kidney disease with heart failure and stage 1 through stage 4 chronic kidney disease, or unspecified chronic kidney disease: Secondary | ICD-10-CM | POA: Diagnosis not present

## 2022-09-22 DIAGNOSIS — N183 Chronic kidney disease, stage 3 unspecified: Secondary | ICD-10-CM | POA: Diagnosis not present

## 2022-09-22 DIAGNOSIS — I5022 Chronic systolic (congestive) heart failure: Secondary | ICD-10-CM | POA: Diagnosis not present

## 2022-09-22 DIAGNOSIS — I4821 Permanent atrial fibrillation: Secondary | ICD-10-CM | POA: Diagnosis not present

## 2022-09-22 DIAGNOSIS — I2510 Atherosclerotic heart disease of native coronary artery without angina pectoris: Secondary | ICD-10-CM | POA: Diagnosis not present

## 2022-09-25 DIAGNOSIS — Z7901 Long term (current) use of anticoagulants: Secondary | ICD-10-CM | POA: Diagnosis not present

## 2022-09-25 DIAGNOSIS — I4821 Permanent atrial fibrillation: Secondary | ICD-10-CM | POA: Diagnosis not present

## 2022-09-25 DIAGNOSIS — I5022 Chronic systolic (congestive) heart failure: Secondary | ICD-10-CM | POA: Diagnosis not present

## 2022-09-25 DIAGNOSIS — I2510 Atherosclerotic heart disease of native coronary artery without angina pectoris: Secondary | ICD-10-CM | POA: Diagnosis not present

## 2022-09-25 DIAGNOSIS — N183 Chronic kidney disease, stage 3 unspecified: Secondary | ICD-10-CM | POA: Diagnosis not present

## 2022-09-25 DIAGNOSIS — I130 Hypertensive heart and chronic kidney disease with heart failure and stage 1 through stage 4 chronic kidney disease, or unspecified chronic kidney disease: Secondary | ICD-10-CM | POA: Diagnosis not present

## 2022-09-27 DIAGNOSIS — I5022 Chronic systolic (congestive) heart failure: Secondary | ICD-10-CM | POA: Diagnosis not present

## 2022-09-27 DIAGNOSIS — Z952 Presence of prosthetic heart valve: Secondary | ICD-10-CM | POA: Diagnosis not present

## 2022-09-27 DIAGNOSIS — I4821 Permanent atrial fibrillation: Secondary | ICD-10-CM | POA: Diagnosis not present

## 2022-09-27 DIAGNOSIS — I272 Pulmonary hypertension, unspecified: Secondary | ICD-10-CM | POA: Diagnosis not present

## 2022-09-27 DIAGNOSIS — J449 Chronic obstructive pulmonary disease, unspecified: Secondary | ICD-10-CM | POA: Diagnosis not present

## 2022-09-27 DIAGNOSIS — I130 Hypertensive heart and chronic kidney disease with heart failure and stage 1 through stage 4 chronic kidney disease, or unspecified chronic kidney disease: Secondary | ICD-10-CM | POA: Diagnosis not present

## 2022-09-27 DIAGNOSIS — N1831 Chronic kidney disease, stage 3a: Secondary | ICD-10-CM | POA: Diagnosis not present

## 2022-09-27 DIAGNOSIS — Z7901 Long term (current) use of anticoagulants: Secondary | ICD-10-CM | POA: Diagnosis not present

## 2022-09-27 DIAGNOSIS — E440 Moderate protein-calorie malnutrition: Secondary | ICD-10-CM | POA: Diagnosis not present

## 2022-09-27 DIAGNOSIS — N3281 Overactive bladder: Secondary | ICD-10-CM | POA: Diagnosis not present

## 2022-09-27 DIAGNOSIS — Z87891 Personal history of nicotine dependence: Secondary | ICD-10-CM | POA: Diagnosis not present

## 2022-09-27 DIAGNOSIS — I2510 Atherosclerotic heart disease of native coronary artery without angina pectoris: Secondary | ICD-10-CM | POA: Diagnosis not present

## 2022-09-27 DIAGNOSIS — N183 Chronic kidney disease, stage 3 unspecified: Secondary | ICD-10-CM | POA: Diagnosis not present

## 2022-09-29 DIAGNOSIS — Z7901 Long term (current) use of anticoagulants: Secondary | ICD-10-CM | POA: Diagnosis not present

## 2022-09-29 DIAGNOSIS — I130 Hypertensive heart and chronic kidney disease with heart failure and stage 1 through stage 4 chronic kidney disease, or unspecified chronic kidney disease: Secondary | ICD-10-CM | POA: Diagnosis not present

## 2022-09-29 DIAGNOSIS — I5022 Chronic systolic (congestive) heart failure: Secondary | ICD-10-CM | POA: Diagnosis not present

## 2022-09-29 DIAGNOSIS — I2510 Atherosclerotic heart disease of native coronary artery without angina pectoris: Secondary | ICD-10-CM | POA: Diagnosis not present

## 2022-09-29 DIAGNOSIS — N183 Chronic kidney disease, stage 3 unspecified: Secondary | ICD-10-CM | POA: Diagnosis not present

## 2022-09-29 DIAGNOSIS — I4821 Permanent atrial fibrillation: Secondary | ICD-10-CM | POA: Diagnosis not present

## 2022-09-30 DIAGNOSIS — N183 Chronic kidney disease, stage 3 unspecified: Secondary | ICD-10-CM | POA: Diagnosis not present

## 2022-09-30 DIAGNOSIS — I2510 Atherosclerotic heart disease of native coronary artery without angina pectoris: Secondary | ICD-10-CM | POA: Diagnosis not present

## 2022-09-30 DIAGNOSIS — I130 Hypertensive heart and chronic kidney disease with heart failure and stage 1 through stage 4 chronic kidney disease, or unspecified chronic kidney disease: Secondary | ICD-10-CM | POA: Diagnosis not present

## 2022-09-30 DIAGNOSIS — Z7901 Long term (current) use of anticoagulants: Secondary | ICD-10-CM | POA: Diagnosis not present

## 2022-09-30 DIAGNOSIS — I4821 Permanent atrial fibrillation: Secondary | ICD-10-CM | POA: Diagnosis not present

## 2022-09-30 DIAGNOSIS — I5022 Chronic systolic (congestive) heart failure: Secondary | ICD-10-CM | POA: Diagnosis not present

## 2022-10-02 DIAGNOSIS — I5022 Chronic systolic (congestive) heart failure: Secondary | ICD-10-CM | POA: Diagnosis not present

## 2022-10-02 DIAGNOSIS — I2510 Atherosclerotic heart disease of native coronary artery without angina pectoris: Secondary | ICD-10-CM | POA: Diagnosis not present

## 2022-10-02 DIAGNOSIS — N183 Chronic kidney disease, stage 3 unspecified: Secondary | ICD-10-CM | POA: Diagnosis not present

## 2022-10-02 DIAGNOSIS — Z7901 Long term (current) use of anticoagulants: Secondary | ICD-10-CM | POA: Diagnosis not present

## 2022-10-02 DIAGNOSIS — I130 Hypertensive heart and chronic kidney disease with heart failure and stage 1 through stage 4 chronic kidney disease, or unspecified chronic kidney disease: Secondary | ICD-10-CM | POA: Diagnosis not present

## 2022-10-02 DIAGNOSIS — I4821 Permanent atrial fibrillation: Secondary | ICD-10-CM | POA: Diagnosis not present

## 2022-10-03 DIAGNOSIS — I4821 Permanent atrial fibrillation: Secondary | ICD-10-CM | POA: Diagnosis not present

## 2022-10-03 DIAGNOSIS — N183 Chronic kidney disease, stage 3 unspecified: Secondary | ICD-10-CM | POA: Diagnosis not present

## 2022-10-03 DIAGNOSIS — Z7901 Long term (current) use of anticoagulants: Secondary | ICD-10-CM | POA: Diagnosis not present

## 2022-10-03 DIAGNOSIS — I2510 Atherosclerotic heart disease of native coronary artery without angina pectoris: Secondary | ICD-10-CM | POA: Diagnosis not present

## 2022-10-03 DIAGNOSIS — I5022 Chronic systolic (congestive) heart failure: Secondary | ICD-10-CM | POA: Diagnosis not present

## 2022-10-03 DIAGNOSIS — I130 Hypertensive heart and chronic kidney disease with heart failure and stage 1 through stage 4 chronic kidney disease, or unspecified chronic kidney disease: Secondary | ICD-10-CM | POA: Diagnosis not present

## 2022-10-04 DIAGNOSIS — I5022 Chronic systolic (congestive) heart failure: Secondary | ICD-10-CM | POA: Diagnosis not present

## 2022-10-04 DIAGNOSIS — N183 Chronic kidney disease, stage 3 unspecified: Secondary | ICD-10-CM | POA: Diagnosis not present

## 2022-10-04 DIAGNOSIS — Z7901 Long term (current) use of anticoagulants: Secondary | ICD-10-CM | POA: Diagnosis not present

## 2022-10-04 DIAGNOSIS — I4821 Permanent atrial fibrillation: Secondary | ICD-10-CM | POA: Diagnosis not present

## 2022-10-04 DIAGNOSIS — I2510 Atherosclerotic heart disease of native coronary artery without angina pectoris: Secondary | ICD-10-CM | POA: Diagnosis not present

## 2022-10-04 DIAGNOSIS — I130 Hypertensive heart and chronic kidney disease with heart failure and stage 1 through stage 4 chronic kidney disease, or unspecified chronic kidney disease: Secondary | ICD-10-CM | POA: Diagnosis not present

## 2022-10-05 DIAGNOSIS — I130 Hypertensive heart and chronic kidney disease with heart failure and stage 1 through stage 4 chronic kidney disease, or unspecified chronic kidney disease: Secondary | ICD-10-CM | POA: Diagnosis not present

## 2022-10-05 DIAGNOSIS — I2510 Atherosclerotic heart disease of native coronary artery without angina pectoris: Secondary | ICD-10-CM | POA: Diagnosis not present

## 2022-10-05 DIAGNOSIS — Z7901 Long term (current) use of anticoagulants: Secondary | ICD-10-CM | POA: Diagnosis not present

## 2022-10-05 DIAGNOSIS — I5022 Chronic systolic (congestive) heart failure: Secondary | ICD-10-CM | POA: Diagnosis not present

## 2022-10-05 DIAGNOSIS — N183 Chronic kidney disease, stage 3 unspecified: Secondary | ICD-10-CM | POA: Diagnosis not present

## 2022-10-05 DIAGNOSIS — I4821 Permanent atrial fibrillation: Secondary | ICD-10-CM | POA: Diagnosis not present

## 2022-10-09 DIAGNOSIS — I5022 Chronic systolic (congestive) heart failure: Secondary | ICD-10-CM | POA: Diagnosis not present

## 2022-10-09 DIAGNOSIS — I2510 Atherosclerotic heart disease of native coronary artery without angina pectoris: Secondary | ICD-10-CM | POA: Diagnosis not present

## 2022-10-09 DIAGNOSIS — I4821 Permanent atrial fibrillation: Secondary | ICD-10-CM | POA: Diagnosis not present

## 2022-10-09 DIAGNOSIS — Z7901 Long term (current) use of anticoagulants: Secondary | ICD-10-CM | POA: Diagnosis not present

## 2022-10-09 DIAGNOSIS — I130 Hypertensive heart and chronic kidney disease with heart failure and stage 1 through stage 4 chronic kidney disease, or unspecified chronic kidney disease: Secondary | ICD-10-CM | POA: Diagnosis not present

## 2022-10-09 DIAGNOSIS — N183 Chronic kidney disease, stage 3 unspecified: Secondary | ICD-10-CM | POA: Diagnosis not present

## 2022-10-11 DIAGNOSIS — N183 Chronic kidney disease, stage 3 unspecified: Secondary | ICD-10-CM | POA: Diagnosis not present

## 2022-10-11 DIAGNOSIS — I2510 Atherosclerotic heart disease of native coronary artery without angina pectoris: Secondary | ICD-10-CM | POA: Diagnosis not present

## 2022-10-11 DIAGNOSIS — Z7901 Long term (current) use of anticoagulants: Secondary | ICD-10-CM | POA: Diagnosis not present

## 2022-10-11 DIAGNOSIS — I130 Hypertensive heart and chronic kidney disease with heart failure and stage 1 through stage 4 chronic kidney disease, or unspecified chronic kidney disease: Secondary | ICD-10-CM | POA: Diagnosis not present

## 2022-10-11 DIAGNOSIS — I4821 Permanent atrial fibrillation: Secondary | ICD-10-CM | POA: Diagnosis not present

## 2022-10-11 DIAGNOSIS — I5022 Chronic systolic (congestive) heart failure: Secondary | ICD-10-CM | POA: Diagnosis not present

## 2022-10-15 DIAGNOSIS — N183 Chronic kidney disease, stage 3 unspecified: Secondary | ICD-10-CM | POA: Diagnosis not present

## 2022-10-15 DIAGNOSIS — Z7901 Long term (current) use of anticoagulants: Secondary | ICD-10-CM | POA: Diagnosis not present

## 2022-10-15 DIAGNOSIS — I2510 Atherosclerotic heart disease of native coronary artery without angina pectoris: Secondary | ICD-10-CM | POA: Diagnosis not present

## 2022-10-15 DIAGNOSIS — Z9181 History of falling: Secondary | ICD-10-CM | POA: Diagnosis not present

## 2022-10-15 DIAGNOSIS — N400 Enlarged prostate without lower urinary tract symptoms: Secondary | ICD-10-CM | POA: Diagnosis not present

## 2022-10-15 DIAGNOSIS — Z952 Presence of prosthetic heart valve: Secondary | ICD-10-CM | POA: Diagnosis not present

## 2022-10-15 DIAGNOSIS — I4821 Permanent atrial fibrillation: Secondary | ICD-10-CM | POA: Diagnosis not present

## 2022-10-15 DIAGNOSIS — Z8673 Personal history of transient ischemic attack (TIA), and cerebral infarction without residual deficits: Secondary | ICD-10-CM | POA: Diagnosis not present

## 2022-10-15 DIAGNOSIS — Z87891 Personal history of nicotine dependence: Secondary | ICD-10-CM | POA: Diagnosis not present

## 2022-10-15 DIAGNOSIS — E440 Moderate protein-calorie malnutrition: Secondary | ICD-10-CM | POA: Diagnosis not present

## 2022-10-15 DIAGNOSIS — I272 Pulmonary hypertension, unspecified: Secondary | ICD-10-CM | POA: Diagnosis not present

## 2022-10-15 DIAGNOSIS — N3281 Overactive bladder: Secondary | ICD-10-CM | POA: Diagnosis not present

## 2022-10-15 DIAGNOSIS — J449 Chronic obstructive pulmonary disease, unspecified: Secondary | ICD-10-CM | POA: Diagnosis not present

## 2022-10-15 DIAGNOSIS — Z8619 Personal history of other infectious and parasitic diseases: Secondary | ICD-10-CM | POA: Diagnosis not present

## 2022-10-15 DIAGNOSIS — I130 Hypertensive heart and chronic kidney disease with heart failure and stage 1 through stage 4 chronic kidney disease, or unspecified chronic kidney disease: Secondary | ICD-10-CM | POA: Diagnosis not present

## 2022-10-15 DIAGNOSIS — I5022 Chronic systolic (congestive) heart failure: Secondary | ICD-10-CM | POA: Diagnosis not present

## 2022-10-17 DIAGNOSIS — I2510 Atherosclerotic heart disease of native coronary artery without angina pectoris: Secondary | ICD-10-CM | POA: Diagnosis not present

## 2022-10-17 DIAGNOSIS — I130 Hypertensive heart and chronic kidney disease with heart failure and stage 1 through stage 4 chronic kidney disease, or unspecified chronic kidney disease: Secondary | ICD-10-CM | POA: Diagnosis not present

## 2022-10-17 DIAGNOSIS — N183 Chronic kidney disease, stage 3 unspecified: Secondary | ICD-10-CM | POA: Diagnosis not present

## 2022-10-17 DIAGNOSIS — I5022 Chronic systolic (congestive) heart failure: Secondary | ICD-10-CM | POA: Diagnosis not present

## 2022-10-17 DIAGNOSIS — Z7901 Long term (current) use of anticoagulants: Secondary | ICD-10-CM | POA: Diagnosis not present

## 2022-10-17 DIAGNOSIS — I4821 Permanent atrial fibrillation: Secondary | ICD-10-CM | POA: Diagnosis not present

## 2022-10-19 DIAGNOSIS — I4821 Permanent atrial fibrillation: Secondary | ICD-10-CM | POA: Diagnosis not present

## 2022-10-19 DIAGNOSIS — N183 Chronic kidney disease, stage 3 unspecified: Secondary | ICD-10-CM | POA: Diagnosis not present

## 2022-10-19 DIAGNOSIS — Z7901 Long term (current) use of anticoagulants: Secondary | ICD-10-CM | POA: Diagnosis not present

## 2022-10-19 DIAGNOSIS — I5022 Chronic systolic (congestive) heart failure: Secondary | ICD-10-CM | POA: Diagnosis not present

## 2022-10-19 DIAGNOSIS — I2510 Atherosclerotic heart disease of native coronary artery without angina pectoris: Secondary | ICD-10-CM | POA: Diagnosis not present

## 2022-10-19 DIAGNOSIS — I130 Hypertensive heart and chronic kidney disease with heart failure and stage 1 through stage 4 chronic kidney disease, or unspecified chronic kidney disease: Secondary | ICD-10-CM | POA: Diagnosis not present

## 2022-10-23 DIAGNOSIS — I5022 Chronic systolic (congestive) heart failure: Secondary | ICD-10-CM | POA: Diagnosis not present

## 2022-10-23 DIAGNOSIS — N183 Chronic kidney disease, stage 3 unspecified: Secondary | ICD-10-CM | POA: Diagnosis not present

## 2022-10-23 DIAGNOSIS — I130 Hypertensive heart and chronic kidney disease with heart failure and stage 1 through stage 4 chronic kidney disease, or unspecified chronic kidney disease: Secondary | ICD-10-CM | POA: Diagnosis not present

## 2022-10-23 DIAGNOSIS — I2510 Atherosclerotic heart disease of native coronary artery without angina pectoris: Secondary | ICD-10-CM | POA: Diagnosis not present

## 2022-10-23 DIAGNOSIS — Z7901 Long term (current) use of anticoagulants: Secondary | ICD-10-CM | POA: Diagnosis not present

## 2022-10-23 DIAGNOSIS — I4821 Permanent atrial fibrillation: Secondary | ICD-10-CM | POA: Diagnosis not present

## 2022-10-25 DIAGNOSIS — I130 Hypertensive heart and chronic kidney disease with heart failure and stage 1 through stage 4 chronic kidney disease, or unspecified chronic kidney disease: Secondary | ICD-10-CM | POA: Diagnosis not present

## 2022-10-25 DIAGNOSIS — I2510 Atherosclerotic heart disease of native coronary artery without angina pectoris: Secondary | ICD-10-CM | POA: Diagnosis not present

## 2022-10-25 DIAGNOSIS — Z7901 Long term (current) use of anticoagulants: Secondary | ICD-10-CM | POA: Diagnosis not present

## 2022-10-25 DIAGNOSIS — I5022 Chronic systolic (congestive) heart failure: Secondary | ICD-10-CM | POA: Diagnosis not present

## 2022-10-25 DIAGNOSIS — I4821 Permanent atrial fibrillation: Secondary | ICD-10-CM | POA: Diagnosis not present

## 2022-10-25 DIAGNOSIS — N183 Chronic kidney disease, stage 3 unspecified: Secondary | ICD-10-CM | POA: Diagnosis not present

## 2022-10-30 DIAGNOSIS — I5022 Chronic systolic (congestive) heart failure: Secondary | ICD-10-CM | POA: Diagnosis not present

## 2022-10-30 DIAGNOSIS — Z7901 Long term (current) use of anticoagulants: Secondary | ICD-10-CM | POA: Diagnosis not present

## 2022-10-30 DIAGNOSIS — I4821 Permanent atrial fibrillation: Secondary | ICD-10-CM | POA: Diagnosis not present

## 2022-10-30 DIAGNOSIS — I2510 Atherosclerotic heart disease of native coronary artery without angina pectoris: Secondary | ICD-10-CM | POA: Diagnosis not present

## 2022-10-30 DIAGNOSIS — I130 Hypertensive heart and chronic kidney disease with heart failure and stage 1 through stage 4 chronic kidney disease, or unspecified chronic kidney disease: Secondary | ICD-10-CM | POA: Diagnosis not present

## 2022-10-30 DIAGNOSIS — N183 Chronic kidney disease, stage 3 unspecified: Secondary | ICD-10-CM | POA: Diagnosis not present

## 2022-11-01 DIAGNOSIS — N183 Chronic kidney disease, stage 3 unspecified: Secondary | ICD-10-CM | POA: Diagnosis not present

## 2022-11-01 DIAGNOSIS — I2510 Atherosclerotic heart disease of native coronary artery without angina pectoris: Secondary | ICD-10-CM | POA: Diagnosis not present

## 2022-11-01 DIAGNOSIS — Z7901 Long term (current) use of anticoagulants: Secondary | ICD-10-CM | POA: Diagnosis not present

## 2022-11-01 DIAGNOSIS — I130 Hypertensive heart and chronic kidney disease with heart failure and stage 1 through stage 4 chronic kidney disease, or unspecified chronic kidney disease: Secondary | ICD-10-CM | POA: Diagnosis not present

## 2022-11-01 DIAGNOSIS — I5022 Chronic systolic (congestive) heart failure: Secondary | ICD-10-CM | POA: Diagnosis not present

## 2022-11-01 DIAGNOSIS — I4821 Permanent atrial fibrillation: Secondary | ICD-10-CM | POA: Diagnosis not present

## 2022-11-06 DIAGNOSIS — N183 Chronic kidney disease, stage 3 unspecified: Secondary | ICD-10-CM | POA: Diagnosis not present

## 2022-11-06 DIAGNOSIS — Z7901 Long term (current) use of anticoagulants: Secondary | ICD-10-CM | POA: Diagnosis not present

## 2022-11-06 DIAGNOSIS — I130 Hypertensive heart and chronic kidney disease with heart failure and stage 1 through stage 4 chronic kidney disease, or unspecified chronic kidney disease: Secondary | ICD-10-CM | POA: Diagnosis not present

## 2022-11-06 DIAGNOSIS — I4821 Permanent atrial fibrillation: Secondary | ICD-10-CM | POA: Diagnosis not present

## 2022-11-06 DIAGNOSIS — I5022 Chronic systolic (congestive) heart failure: Secondary | ICD-10-CM | POA: Diagnosis not present

## 2022-11-06 DIAGNOSIS — I2510 Atherosclerotic heart disease of native coronary artery without angina pectoris: Secondary | ICD-10-CM | POA: Diagnosis not present

## 2022-11-07 DIAGNOSIS — I5022 Chronic systolic (congestive) heart failure: Secondary | ICD-10-CM | POA: Diagnosis not present

## 2022-11-07 DIAGNOSIS — I130 Hypertensive heart and chronic kidney disease with heart failure and stage 1 through stage 4 chronic kidney disease, or unspecified chronic kidney disease: Secondary | ICD-10-CM | POA: Diagnosis not present

## 2022-11-07 DIAGNOSIS — Z7901 Long term (current) use of anticoagulants: Secondary | ICD-10-CM | POA: Diagnosis not present

## 2022-11-07 DIAGNOSIS — N183 Chronic kidney disease, stage 3 unspecified: Secondary | ICD-10-CM | POA: Diagnosis not present

## 2022-11-07 DIAGNOSIS — I4821 Permanent atrial fibrillation: Secondary | ICD-10-CM | POA: Diagnosis not present

## 2022-11-07 DIAGNOSIS — I2510 Atherosclerotic heart disease of native coronary artery without angina pectoris: Secondary | ICD-10-CM | POA: Diagnosis not present

## 2022-11-08 DIAGNOSIS — E440 Moderate protein-calorie malnutrition: Secondary | ICD-10-CM | POA: Diagnosis not present

## 2022-11-08 DIAGNOSIS — Z952 Presence of prosthetic heart valve: Secondary | ICD-10-CM | POA: Diagnosis not present

## 2022-11-08 DIAGNOSIS — I2510 Atherosclerotic heart disease of native coronary artery without angina pectoris: Secondary | ICD-10-CM | POA: Diagnosis not present

## 2022-11-08 DIAGNOSIS — N1831 Chronic kidney disease, stage 3a: Secondary | ICD-10-CM | POA: Diagnosis not present

## 2022-11-08 DIAGNOSIS — N183 Chronic kidney disease, stage 3 unspecified: Secondary | ICD-10-CM | POA: Diagnosis not present

## 2022-11-08 DIAGNOSIS — Z8673 Personal history of transient ischemic attack (TIA), and cerebral infarction without residual deficits: Secondary | ICD-10-CM | POA: Diagnosis not present

## 2022-11-08 DIAGNOSIS — I4821 Permanent atrial fibrillation: Secondary | ICD-10-CM | POA: Diagnosis not present

## 2022-11-08 DIAGNOSIS — I130 Hypertensive heart and chronic kidney disease with heart failure and stage 1 through stage 4 chronic kidney disease, or unspecified chronic kidney disease: Secondary | ICD-10-CM | POA: Diagnosis not present

## 2022-11-08 DIAGNOSIS — J449 Chronic obstructive pulmonary disease, unspecified: Secondary | ICD-10-CM | POA: Diagnosis not present

## 2022-11-08 DIAGNOSIS — I272 Pulmonary hypertension, unspecified: Secondary | ICD-10-CM | POA: Diagnosis not present

## 2022-11-08 DIAGNOSIS — Z7901 Long term (current) use of anticoagulants: Secondary | ICD-10-CM | POA: Diagnosis not present

## 2022-11-08 DIAGNOSIS — I5022 Chronic systolic (congestive) heart failure: Secondary | ICD-10-CM | POA: Diagnosis not present

## 2022-11-08 DIAGNOSIS — Z87891 Personal history of nicotine dependence: Secondary | ICD-10-CM | POA: Diagnosis not present

## 2022-11-09 DIAGNOSIS — Z7901 Long term (current) use of anticoagulants: Secondary | ICD-10-CM | POA: Diagnosis not present

## 2022-11-09 DIAGNOSIS — I2510 Atherosclerotic heart disease of native coronary artery without angina pectoris: Secondary | ICD-10-CM | POA: Diagnosis not present

## 2022-11-09 DIAGNOSIS — I4821 Permanent atrial fibrillation: Secondary | ICD-10-CM | POA: Diagnosis not present

## 2022-11-09 DIAGNOSIS — Z23 Encounter for immunization: Secondary | ICD-10-CM | POA: Diagnosis not present

## 2022-11-09 DIAGNOSIS — I130 Hypertensive heart and chronic kidney disease with heart failure and stage 1 through stage 4 chronic kidney disease, or unspecified chronic kidney disease: Secondary | ICD-10-CM | POA: Diagnosis not present

## 2022-11-09 DIAGNOSIS — I5022 Chronic systolic (congestive) heart failure: Secondary | ICD-10-CM | POA: Diagnosis not present

## 2022-11-09 DIAGNOSIS — N183 Chronic kidney disease, stage 3 unspecified: Secondary | ICD-10-CM | POA: Diagnosis not present

## 2022-11-10 DIAGNOSIS — Z7901 Long term (current) use of anticoagulants: Secondary | ICD-10-CM | POA: Diagnosis not present

## 2022-11-10 DIAGNOSIS — N183 Chronic kidney disease, stage 3 unspecified: Secondary | ICD-10-CM | POA: Diagnosis not present

## 2022-11-10 DIAGNOSIS — I4821 Permanent atrial fibrillation: Secondary | ICD-10-CM | POA: Diagnosis not present

## 2022-11-10 DIAGNOSIS — I2510 Atherosclerotic heart disease of native coronary artery without angina pectoris: Secondary | ICD-10-CM | POA: Diagnosis not present

## 2022-11-10 DIAGNOSIS — I5022 Chronic systolic (congestive) heart failure: Secondary | ICD-10-CM | POA: Diagnosis not present

## 2022-11-10 DIAGNOSIS — I130 Hypertensive heart and chronic kidney disease with heart failure and stage 1 through stage 4 chronic kidney disease, or unspecified chronic kidney disease: Secondary | ICD-10-CM | POA: Diagnosis not present

## 2022-11-13 DIAGNOSIS — I2510 Atherosclerotic heart disease of native coronary artery without angina pectoris: Secondary | ICD-10-CM | POA: Diagnosis not present

## 2022-11-13 DIAGNOSIS — Z7901 Long term (current) use of anticoagulants: Secondary | ICD-10-CM | POA: Diagnosis not present

## 2022-11-13 DIAGNOSIS — I4821 Permanent atrial fibrillation: Secondary | ICD-10-CM | POA: Diagnosis not present

## 2022-11-13 DIAGNOSIS — I130 Hypertensive heart and chronic kidney disease with heart failure and stage 1 through stage 4 chronic kidney disease, or unspecified chronic kidney disease: Secondary | ICD-10-CM | POA: Diagnosis not present

## 2022-11-13 DIAGNOSIS — N183 Chronic kidney disease, stage 3 unspecified: Secondary | ICD-10-CM | POA: Diagnosis not present

## 2022-11-13 DIAGNOSIS — I5022 Chronic systolic (congestive) heart failure: Secondary | ICD-10-CM | POA: Diagnosis not present

## 2022-11-14 DIAGNOSIS — E440 Moderate protein-calorie malnutrition: Secondary | ICD-10-CM | POA: Diagnosis not present

## 2022-11-14 DIAGNOSIS — Z952 Presence of prosthetic heart valve: Secondary | ICD-10-CM | POA: Diagnosis not present

## 2022-11-14 DIAGNOSIS — I2510 Atherosclerotic heart disease of native coronary artery without angina pectoris: Secondary | ICD-10-CM | POA: Diagnosis not present

## 2022-11-14 DIAGNOSIS — Z9181 History of falling: Secondary | ICD-10-CM | POA: Diagnosis not present

## 2022-11-14 DIAGNOSIS — Z87891 Personal history of nicotine dependence: Secondary | ICD-10-CM | POA: Diagnosis not present

## 2022-11-14 DIAGNOSIS — Z8673 Personal history of transient ischemic attack (TIA), and cerebral infarction without residual deficits: Secondary | ICD-10-CM | POA: Diagnosis not present

## 2022-11-14 DIAGNOSIS — N400 Enlarged prostate without lower urinary tract symptoms: Secondary | ICD-10-CM | POA: Diagnosis not present

## 2022-11-14 DIAGNOSIS — J449 Chronic obstructive pulmonary disease, unspecified: Secondary | ICD-10-CM | POA: Diagnosis not present

## 2022-11-14 DIAGNOSIS — I272 Pulmonary hypertension, unspecified: Secondary | ICD-10-CM | POA: Diagnosis not present

## 2022-11-14 DIAGNOSIS — I5022 Chronic systolic (congestive) heart failure: Secondary | ICD-10-CM | POA: Diagnosis not present

## 2022-11-14 DIAGNOSIS — Z8619 Personal history of other infectious and parasitic diseases: Secondary | ICD-10-CM | POA: Diagnosis not present

## 2022-11-14 DIAGNOSIS — N3281 Overactive bladder: Secondary | ICD-10-CM | POA: Diagnosis not present

## 2022-11-14 DIAGNOSIS — N183 Chronic kidney disease, stage 3 unspecified: Secondary | ICD-10-CM | POA: Diagnosis not present

## 2022-11-14 DIAGNOSIS — I4821 Permanent atrial fibrillation: Secondary | ICD-10-CM | POA: Diagnosis not present

## 2022-11-14 DIAGNOSIS — Z7901 Long term (current) use of anticoagulants: Secondary | ICD-10-CM | POA: Diagnosis not present

## 2022-11-14 DIAGNOSIS — I130 Hypertensive heart and chronic kidney disease with heart failure and stage 1 through stage 4 chronic kidney disease, or unspecified chronic kidney disease: Secondary | ICD-10-CM | POA: Diagnosis not present

## 2022-11-15 DIAGNOSIS — I5022 Chronic systolic (congestive) heart failure: Secondary | ICD-10-CM | POA: Diagnosis not present

## 2022-11-15 DIAGNOSIS — N183 Chronic kidney disease, stage 3 unspecified: Secondary | ICD-10-CM | POA: Diagnosis not present

## 2022-11-15 DIAGNOSIS — I2510 Atherosclerotic heart disease of native coronary artery without angina pectoris: Secondary | ICD-10-CM | POA: Diagnosis not present

## 2022-11-15 DIAGNOSIS — I130 Hypertensive heart and chronic kidney disease with heart failure and stage 1 through stage 4 chronic kidney disease, or unspecified chronic kidney disease: Secondary | ICD-10-CM | POA: Diagnosis not present

## 2022-11-15 DIAGNOSIS — Z7901 Long term (current) use of anticoagulants: Secondary | ICD-10-CM | POA: Diagnosis not present

## 2022-11-15 DIAGNOSIS — I4821 Permanent atrial fibrillation: Secondary | ICD-10-CM | POA: Diagnosis not present

## 2022-11-16 DIAGNOSIS — N183 Chronic kidney disease, stage 3 unspecified: Secondary | ICD-10-CM | POA: Diagnosis not present

## 2022-11-16 DIAGNOSIS — Z7901 Long term (current) use of anticoagulants: Secondary | ICD-10-CM | POA: Diagnosis not present

## 2022-11-16 DIAGNOSIS — I2510 Atherosclerotic heart disease of native coronary artery without angina pectoris: Secondary | ICD-10-CM | POA: Diagnosis not present

## 2022-11-16 DIAGNOSIS — I5022 Chronic systolic (congestive) heart failure: Secondary | ICD-10-CM | POA: Diagnosis not present

## 2022-11-16 DIAGNOSIS — I4821 Permanent atrial fibrillation: Secondary | ICD-10-CM | POA: Diagnosis not present

## 2022-11-16 DIAGNOSIS — I130 Hypertensive heart and chronic kidney disease with heart failure and stage 1 through stage 4 chronic kidney disease, or unspecified chronic kidney disease: Secondary | ICD-10-CM | POA: Diagnosis not present

## 2022-11-20 DIAGNOSIS — I2510 Atherosclerotic heart disease of native coronary artery without angina pectoris: Secondary | ICD-10-CM | POA: Diagnosis not present

## 2022-11-20 DIAGNOSIS — N183 Chronic kidney disease, stage 3 unspecified: Secondary | ICD-10-CM | POA: Diagnosis not present

## 2022-11-20 DIAGNOSIS — Z7901 Long term (current) use of anticoagulants: Secondary | ICD-10-CM | POA: Diagnosis not present

## 2022-11-20 DIAGNOSIS — I4821 Permanent atrial fibrillation: Secondary | ICD-10-CM | POA: Diagnosis not present

## 2022-11-20 DIAGNOSIS — I130 Hypertensive heart and chronic kidney disease with heart failure and stage 1 through stage 4 chronic kidney disease, or unspecified chronic kidney disease: Secondary | ICD-10-CM | POA: Diagnosis not present

## 2022-11-20 DIAGNOSIS — I5022 Chronic systolic (congestive) heart failure: Secondary | ICD-10-CM | POA: Diagnosis not present

## 2022-11-21 DIAGNOSIS — Z7901 Long term (current) use of anticoagulants: Secondary | ICD-10-CM | POA: Diagnosis not present

## 2022-11-21 DIAGNOSIS — I4821 Permanent atrial fibrillation: Secondary | ICD-10-CM | POA: Diagnosis not present

## 2022-11-21 DIAGNOSIS — I5022 Chronic systolic (congestive) heart failure: Secondary | ICD-10-CM | POA: Diagnosis not present

## 2022-11-21 DIAGNOSIS — I2510 Atherosclerotic heart disease of native coronary artery without angina pectoris: Secondary | ICD-10-CM | POA: Diagnosis not present

## 2022-11-21 DIAGNOSIS — I130 Hypertensive heart and chronic kidney disease with heart failure and stage 1 through stage 4 chronic kidney disease, or unspecified chronic kidney disease: Secondary | ICD-10-CM | POA: Diagnosis not present

## 2022-11-21 DIAGNOSIS — N183 Chronic kidney disease, stage 3 unspecified: Secondary | ICD-10-CM | POA: Diagnosis not present

## 2022-11-22 DIAGNOSIS — Z7901 Long term (current) use of anticoagulants: Secondary | ICD-10-CM | POA: Diagnosis not present

## 2022-11-22 DIAGNOSIS — I130 Hypertensive heart and chronic kidney disease with heart failure and stage 1 through stage 4 chronic kidney disease, or unspecified chronic kidney disease: Secondary | ICD-10-CM | POA: Diagnosis not present

## 2022-11-22 DIAGNOSIS — I2510 Atherosclerotic heart disease of native coronary artery without angina pectoris: Secondary | ICD-10-CM | POA: Diagnosis not present

## 2022-11-22 DIAGNOSIS — I5022 Chronic systolic (congestive) heart failure: Secondary | ICD-10-CM | POA: Diagnosis not present

## 2022-11-22 DIAGNOSIS — I4821 Permanent atrial fibrillation: Secondary | ICD-10-CM | POA: Diagnosis not present

## 2022-11-22 DIAGNOSIS — N183 Chronic kidney disease, stage 3 unspecified: Secondary | ICD-10-CM | POA: Diagnosis not present

## 2022-11-23 DIAGNOSIS — I2510 Atherosclerotic heart disease of native coronary artery without angina pectoris: Secondary | ICD-10-CM | POA: Diagnosis not present

## 2022-11-23 DIAGNOSIS — Z7901 Long term (current) use of anticoagulants: Secondary | ICD-10-CM | POA: Diagnosis not present

## 2022-11-23 DIAGNOSIS — I4821 Permanent atrial fibrillation: Secondary | ICD-10-CM | POA: Diagnosis not present

## 2022-11-23 DIAGNOSIS — I5022 Chronic systolic (congestive) heart failure: Secondary | ICD-10-CM | POA: Diagnosis not present

## 2022-11-23 DIAGNOSIS — I130 Hypertensive heart and chronic kidney disease with heart failure and stage 1 through stage 4 chronic kidney disease, or unspecified chronic kidney disease: Secondary | ICD-10-CM | POA: Diagnosis not present

## 2022-11-23 DIAGNOSIS — N183 Chronic kidney disease, stage 3 unspecified: Secondary | ICD-10-CM | POA: Diagnosis not present

## 2022-11-26 DIAGNOSIS — I5022 Chronic systolic (congestive) heart failure: Secondary | ICD-10-CM | POA: Diagnosis not present

## 2022-11-26 DIAGNOSIS — I2510 Atherosclerotic heart disease of native coronary artery without angina pectoris: Secondary | ICD-10-CM | POA: Diagnosis not present

## 2022-11-26 DIAGNOSIS — I4821 Permanent atrial fibrillation: Secondary | ICD-10-CM | POA: Diagnosis not present

## 2022-11-26 DIAGNOSIS — I130 Hypertensive heart and chronic kidney disease with heart failure and stage 1 through stage 4 chronic kidney disease, or unspecified chronic kidney disease: Secondary | ICD-10-CM | POA: Diagnosis not present

## 2022-11-26 DIAGNOSIS — Z7901 Long term (current) use of anticoagulants: Secondary | ICD-10-CM | POA: Diagnosis not present

## 2022-11-26 DIAGNOSIS — N183 Chronic kidney disease, stage 3 unspecified: Secondary | ICD-10-CM | POA: Diagnosis not present

## 2022-11-27 DIAGNOSIS — I4821 Permanent atrial fibrillation: Secondary | ICD-10-CM | POA: Diagnosis not present

## 2022-11-27 DIAGNOSIS — I130 Hypertensive heart and chronic kidney disease with heart failure and stage 1 through stage 4 chronic kidney disease, or unspecified chronic kidney disease: Secondary | ICD-10-CM | POA: Diagnosis not present

## 2022-11-27 DIAGNOSIS — I2510 Atherosclerotic heart disease of native coronary artery without angina pectoris: Secondary | ICD-10-CM | POA: Diagnosis not present

## 2022-11-27 DIAGNOSIS — Z7901 Long term (current) use of anticoagulants: Secondary | ICD-10-CM | POA: Diagnosis not present

## 2022-11-27 DIAGNOSIS — N183 Chronic kidney disease, stage 3 unspecified: Secondary | ICD-10-CM | POA: Diagnosis not present

## 2022-11-27 DIAGNOSIS — I5022 Chronic systolic (congestive) heart failure: Secondary | ICD-10-CM | POA: Diagnosis not present

## 2022-11-29 DIAGNOSIS — Z7901 Long term (current) use of anticoagulants: Secondary | ICD-10-CM | POA: Diagnosis not present

## 2022-11-29 DIAGNOSIS — I4821 Permanent atrial fibrillation: Secondary | ICD-10-CM | POA: Diagnosis not present

## 2022-11-29 DIAGNOSIS — I130 Hypertensive heart and chronic kidney disease with heart failure and stage 1 through stage 4 chronic kidney disease, or unspecified chronic kidney disease: Secondary | ICD-10-CM | POA: Diagnosis not present

## 2022-11-29 DIAGNOSIS — I2510 Atherosclerotic heart disease of native coronary artery without angina pectoris: Secondary | ICD-10-CM | POA: Diagnosis not present

## 2022-11-29 DIAGNOSIS — I5022 Chronic systolic (congestive) heart failure: Secondary | ICD-10-CM | POA: Diagnosis not present

## 2022-11-29 DIAGNOSIS — N183 Chronic kidney disease, stage 3 unspecified: Secondary | ICD-10-CM | POA: Diagnosis not present

## 2022-11-30 DIAGNOSIS — I4821 Permanent atrial fibrillation: Secondary | ICD-10-CM | POA: Diagnosis not present

## 2022-11-30 DIAGNOSIS — I2510 Atherosclerotic heart disease of native coronary artery without angina pectoris: Secondary | ICD-10-CM | POA: Diagnosis not present

## 2022-11-30 DIAGNOSIS — I5022 Chronic systolic (congestive) heart failure: Secondary | ICD-10-CM | POA: Diagnosis not present

## 2022-11-30 DIAGNOSIS — N183 Chronic kidney disease, stage 3 unspecified: Secondary | ICD-10-CM | POA: Diagnosis not present

## 2022-11-30 DIAGNOSIS — Z7901 Long term (current) use of anticoagulants: Secondary | ICD-10-CM | POA: Diagnosis not present

## 2022-11-30 DIAGNOSIS — I130 Hypertensive heart and chronic kidney disease with heart failure and stage 1 through stage 4 chronic kidney disease, or unspecified chronic kidney disease: Secondary | ICD-10-CM | POA: Diagnosis not present

## 2022-12-04 DIAGNOSIS — I130 Hypertensive heart and chronic kidney disease with heart failure and stage 1 through stage 4 chronic kidney disease, or unspecified chronic kidney disease: Secondary | ICD-10-CM | POA: Diagnosis not present

## 2022-12-04 DIAGNOSIS — I2510 Atherosclerotic heart disease of native coronary artery without angina pectoris: Secondary | ICD-10-CM | POA: Diagnosis not present

## 2022-12-04 DIAGNOSIS — N183 Chronic kidney disease, stage 3 unspecified: Secondary | ICD-10-CM | POA: Diagnosis not present

## 2022-12-04 DIAGNOSIS — Z7901 Long term (current) use of anticoagulants: Secondary | ICD-10-CM | POA: Diagnosis not present

## 2022-12-04 DIAGNOSIS — I5022 Chronic systolic (congestive) heart failure: Secondary | ICD-10-CM | POA: Diagnosis not present

## 2022-12-04 DIAGNOSIS — I4821 Permanent atrial fibrillation: Secondary | ICD-10-CM | POA: Diagnosis not present

## 2022-12-05 DIAGNOSIS — I130 Hypertensive heart and chronic kidney disease with heart failure and stage 1 through stage 4 chronic kidney disease, or unspecified chronic kidney disease: Secondary | ICD-10-CM | POA: Diagnosis not present

## 2022-12-05 DIAGNOSIS — Z7901 Long term (current) use of anticoagulants: Secondary | ICD-10-CM | POA: Diagnosis not present

## 2022-12-05 DIAGNOSIS — I4821 Permanent atrial fibrillation: Secondary | ICD-10-CM | POA: Diagnosis not present

## 2022-12-05 DIAGNOSIS — I2510 Atherosclerotic heart disease of native coronary artery without angina pectoris: Secondary | ICD-10-CM | POA: Diagnosis not present

## 2022-12-05 DIAGNOSIS — I5022 Chronic systolic (congestive) heart failure: Secondary | ICD-10-CM | POA: Diagnosis not present

## 2022-12-05 DIAGNOSIS — N183 Chronic kidney disease, stage 3 unspecified: Secondary | ICD-10-CM | POA: Diagnosis not present

## 2022-12-06 DIAGNOSIS — Z7901 Long term (current) use of anticoagulants: Secondary | ICD-10-CM | POA: Diagnosis not present

## 2022-12-06 DIAGNOSIS — I4821 Permanent atrial fibrillation: Secondary | ICD-10-CM | POA: Diagnosis not present

## 2022-12-06 DIAGNOSIS — I5022 Chronic systolic (congestive) heart failure: Secondary | ICD-10-CM | POA: Diagnosis not present

## 2022-12-06 DIAGNOSIS — I2510 Atherosclerotic heart disease of native coronary artery without angina pectoris: Secondary | ICD-10-CM | POA: Diagnosis not present

## 2022-12-06 DIAGNOSIS — N183 Chronic kidney disease, stage 3 unspecified: Secondary | ICD-10-CM | POA: Diagnosis not present

## 2022-12-06 DIAGNOSIS — I130 Hypertensive heart and chronic kidney disease with heart failure and stage 1 through stage 4 chronic kidney disease, or unspecified chronic kidney disease: Secondary | ICD-10-CM | POA: Diagnosis not present

## 2022-12-15 DEATH — deceased

## 2023-08-31 ENCOUNTER — Encounter: Payer: Self-pay | Admitting: Advanced Practice Midwife
